# Patient Record
Sex: Male | Born: 1950 | Race: White | Hispanic: No | Marital: Married | Smoking: Never smoker
Health system: Southern US, Community
[De-identification: ages and names within clinical notes are randomized; demographics above are authoritative.]

## PROBLEM LIST (undated history)

## (undated) ENCOUNTER — Ambulatory Visit: Admission: EM | Payer: Medicare HMO | Source: Home / Self Care

## (undated) DIAGNOSIS — M1990 Unspecified osteoarthritis, unspecified site: Secondary | ICD-10-CM

## (undated) DIAGNOSIS — I509 Heart failure, unspecified: Secondary | ICD-10-CM

## (undated) DIAGNOSIS — F1011 Alcohol abuse, in remission: Secondary | ICD-10-CM

## (undated) DIAGNOSIS — I209 Angina pectoris, unspecified: Secondary | ICD-10-CM

## (undated) DIAGNOSIS — R0602 Shortness of breath: Secondary | ICD-10-CM

## (undated) DIAGNOSIS — K760 Fatty (change of) liver, not elsewhere classified: Secondary | ICD-10-CM

## (undated) DIAGNOSIS — IMO0001 Reserved for inherently not codable concepts without codable children: Secondary | ICD-10-CM

## (undated) DIAGNOSIS — E1165 Type 2 diabetes mellitus with hyperglycemia: Principal | ICD-10-CM

## (undated) DIAGNOSIS — E669 Obesity, unspecified: Secondary | ICD-10-CM

## (undated) DIAGNOSIS — I10 Essential (primary) hypertension: Secondary | ICD-10-CM

## (undated) DIAGNOSIS — F988 Other specified behavioral and emotional disorders with onset usually occurring in childhood and adolescence: Secondary | ICD-10-CM

## (undated) DIAGNOSIS — D649 Anemia, unspecified: Secondary | ICD-10-CM

## (undated) DIAGNOSIS — R600 Localized edema: Secondary | ICD-10-CM

## (undated) DIAGNOSIS — Z5189 Encounter for other specified aftercare: Secondary | ICD-10-CM

## (undated) DIAGNOSIS — M549 Dorsalgia, unspecified: Secondary | ICD-10-CM

## (undated) DIAGNOSIS — D131 Benign neoplasm of stomach: Secondary | ICD-10-CM

## (undated) DIAGNOSIS — R51 Headache: Secondary | ICD-10-CM

## (undated) DIAGNOSIS — B1910 Unspecified viral hepatitis B without hepatic coma: Secondary | ICD-10-CM

## (undated) DIAGNOSIS — N289 Disorder of kidney and ureter, unspecified: Secondary | ICD-10-CM

## (undated) DIAGNOSIS — J189 Pneumonia, unspecified organism: Secondary | ICD-10-CM

## (undated) DIAGNOSIS — I4891 Unspecified atrial fibrillation: Secondary | ICD-10-CM

## (undated) DIAGNOSIS — G9332 Myalgic encephalomyelitis/chronic fatigue syndrome: Secondary | ICD-10-CM

## (undated) DIAGNOSIS — G4733 Obstructive sleep apnea (adult) (pediatric): Secondary | ICD-10-CM

## (undated) DIAGNOSIS — K5900 Constipation, unspecified: Secondary | ICD-10-CM

## (undated) DIAGNOSIS — R079 Chest pain, unspecified: Secondary | ICD-10-CM

## (undated) DIAGNOSIS — K5790 Diverticulosis of intestine, part unspecified, without perforation or abscess without bleeding: Secondary | ICD-10-CM

## (undated) DIAGNOSIS — I219 Acute myocardial infarction, unspecified: Secondary | ICD-10-CM

## (undated) DIAGNOSIS — G709 Myoneural disorder, unspecified: Secondary | ICD-10-CM

## (undated) DIAGNOSIS — F419 Anxiety disorder, unspecified: Secondary | ICD-10-CM

## (undated) DIAGNOSIS — H9190 Unspecified hearing loss, unspecified ear: Secondary | ICD-10-CM

## (undated) DIAGNOSIS — F329 Major depressive disorder, single episode, unspecified: Secondary | ICD-10-CM

## (undated) DIAGNOSIS — E785 Hyperlipidemia, unspecified: Secondary | ICD-10-CM

## (undated) DIAGNOSIS — F32A Depression, unspecified: Secondary | ICD-10-CM

## (undated) DIAGNOSIS — F1010 Alcohol abuse, uncomplicated: Secondary | ICD-10-CM

## (undated) DIAGNOSIS — N183 Chronic kidney disease, stage 3 (moderate): Secondary | ICD-10-CM

## (undated) DIAGNOSIS — Z86718 Personal history of other venous thrombosis and embolism: Secondary | ICD-10-CM

## (undated) DIAGNOSIS — G5610 Other lesions of median nerve, unspecified upper limb: Secondary | ICD-10-CM

## (undated) DIAGNOSIS — H269 Unspecified cataract: Secondary | ICD-10-CM

## (undated) DIAGNOSIS — K259 Gastric ulcer, unspecified as acute or chronic, without hemorrhage or perforation: Secondary | ICD-10-CM

## (undated) DIAGNOSIS — E1140 Type 2 diabetes mellitus with diabetic neuropathy, unspecified: Principal | ICD-10-CM

## (undated) DIAGNOSIS — E119 Type 2 diabetes mellitus without complications: Secondary | ICD-10-CM

## (undated) DIAGNOSIS — M2550 Pain in unspecified joint: Secondary | ICD-10-CM

## (undated) HISTORY — DX: Acute myocardial infarction, unspecified: I21.9

## (undated) HISTORY — DX: Type 2 diabetes mellitus with diabetic neuropathy, unspecified: E11.40

## (undated) HISTORY — DX: Morbid (severe) obesity due to excess calories: E66.01

## (undated) HISTORY — DX: Unspecified osteoarthritis, unspecified site: M19.90

## (undated) HISTORY — DX: Type 2 diabetes mellitus with hyperglycemia: E11.65

## (undated) HISTORY — DX: Constipation, unspecified: K59.00

## (undated) HISTORY — DX: Diverticulosis of intestine, part unspecified, without perforation or abscess without bleeding: K57.90

## (undated) HISTORY — DX: Other specified behavioral and emotional disorders with onset usually occurring in childhood and adolescence: F98.8

## (undated) HISTORY — DX: Alcohol abuse, in remission: F10.11

## (undated) HISTORY — DX: Fatty (change of) liver, not elsewhere classified: K76.0

## (undated) HISTORY — DX: Myalgic encephalomyelitis/chronic fatigue syndrome: G93.32

## (undated) HISTORY — PX: DIAGNOSTIC LAPAROSCOPY: SUR761

## (undated) HISTORY — DX: Localized edema: R60.0

## (undated) HISTORY — DX: Dorsalgia, unspecified: M54.9

## (undated) HISTORY — DX: Unspecified cataract: H26.9

## (undated) HISTORY — DX: Benign neoplasm of stomach: D13.1

## (undated) HISTORY — PX: BACK SURGERY: SHX140

## (undated) HISTORY — DX: Alcohol abuse, uncomplicated: F10.10

## (undated) HISTORY — DX: Hyperlipidemia, unspecified: E78.5

## (undated) HISTORY — PX: COLONOSCOPY: SHX174

## (undated) HISTORY — DX: Unspecified viral hepatitis B without hepatic coma: B19.10

## (undated) HISTORY — DX: Chronic kidney disease, stage 3 (moderate): N18.3

## (undated) HISTORY — DX: Obesity, unspecified: E66.9

## (undated) HISTORY — DX: Unspecified hearing loss, unspecified ear: H91.90

## (undated) HISTORY — DX: Personal history of other venous thrombosis and embolism: Z86.718

## (undated) HISTORY — PX: ABDOMINAL SURGERY: SHX537

## (undated) HISTORY — DX: Pain in unspecified joint: M25.50

## (undated) HISTORY — DX: Chest pain, unspecified: R07.9

## (undated) HISTORY — DX: Type 2 diabetes mellitus without complications: E11.9

## (undated) HISTORY — DX: Other lesions of median nerve, unspecified upper limb: G56.10

## (undated) HISTORY — DX: Gastric ulcer, unspecified as acute or chronic, without hemorrhage or perforation: K25.9

---

## 1975-03-17 HISTORY — PX: GASTRIC BYPASS: SHX52

## 1994-03-16 HISTORY — PX: HYDROCELE EXCISION: SHX482

## 2002-03-16 HISTORY — PX: KNEE SURGERY: SHX244

## 2011-05-08 ENCOUNTER — Emergency Department (HOSPITAL_COMMUNITY): Payer: Medicare Other

## 2011-05-08 ENCOUNTER — Other Ambulatory Visit: Payer: Self-pay

## 2011-05-08 ENCOUNTER — Observation Stay (HOSPITAL_COMMUNITY)
Admission: EM | Admit: 2011-05-08 | Discharge: 2011-05-10 | Disposition: A | Discharge status: Home / Self Care | Payer: Medicare Other | Attending: Internal Medicine | Admitting: Internal Medicine

## 2011-05-08 ENCOUNTER — Encounter (HOSPITAL_COMMUNITY): Payer: Self-pay

## 2011-05-08 DIAGNOSIS — E876 Hypokalemia: Secondary | ICD-10-CM | POA: Diagnosis present

## 2011-05-08 DIAGNOSIS — K5900 Constipation, unspecified: Secondary | ICD-10-CM | POA: Diagnosis present

## 2011-05-08 DIAGNOSIS — G4733 Obstructive sleep apnea (adult) (pediatric): Secondary | ICD-10-CM | POA: Insufficient documentation

## 2011-05-08 DIAGNOSIS — G8929 Other chronic pain: Secondary | ICD-10-CM | POA: Insufficient documentation

## 2011-05-08 DIAGNOSIS — R51 Headache: Secondary | ICD-10-CM | POA: Insufficient documentation

## 2011-05-08 DIAGNOSIS — R0602 Shortness of breath: Secondary | ICD-10-CM | POA: Insufficient documentation

## 2011-05-08 DIAGNOSIS — R079 Chest pain, unspecified: Principal | ICD-10-CM | POA: Diagnosis present

## 2011-05-08 DIAGNOSIS — I10 Essential (primary) hypertension: Secondary | ICD-10-CM | POA: Insufficient documentation

## 2011-05-08 HISTORY — DX: Unspecified osteoarthritis, unspecified site: M19.90

## 2011-05-08 HISTORY — DX: Heart failure, unspecified: I50.9

## 2011-05-08 HISTORY — DX: Major depressive disorder, single episode, unspecified: F32.9

## 2011-05-08 HISTORY — DX: Essential (primary) hypertension: I10

## 2011-05-08 HISTORY — DX: Myoneural disorder, unspecified: G70.9

## 2011-05-08 HISTORY — DX: Shortness of breath: R06.02

## 2011-05-08 HISTORY — DX: Encounter for other specified aftercare: Z51.89

## 2011-05-08 HISTORY — DX: Obstructive sleep apnea (adult) (pediatric): G47.33

## 2011-05-08 HISTORY — DX: Angina pectoris, unspecified: I20.9

## 2011-05-08 HISTORY — DX: Headache: R51

## 2011-05-08 HISTORY — DX: Anxiety disorder, unspecified: F41.9

## 2011-05-08 HISTORY — DX: Anemia, unspecified: D64.9

## 2011-05-08 HISTORY — DX: Depression, unspecified: F32.A

## 2011-05-08 HISTORY — DX: Reserved for inherently not codable concepts without codable children: IMO0001

## 2011-05-08 HISTORY — DX: Pneumonia, unspecified organism: J18.9

## 2011-05-08 LAB — APTT: aPTT: 26 seconds (ref 24–37)

## 2011-05-08 LAB — DIFFERENTIAL
Basophils Absolute: 0.1 10*3/uL (ref 0.0–0.1)
Basophils Relative: 1 % (ref 0–1)
Eosinophils Relative: 2 % (ref 0–5)
Monocytes Absolute: 0.9 10*3/uL (ref 0.1–1.0)
Monocytes Relative: 9 % (ref 3–12)
Neutro Abs: 5.8 10*3/uL (ref 1.7–7.7)

## 2011-05-08 LAB — COMPREHENSIVE METABOLIC PANEL
Albumin: 3.6 g/dL (ref 3.5–5.2)
BUN: 21 mg/dL (ref 6–23)
CO2: 36 mEq/L — ABNORMAL HIGH (ref 19–32)
Calcium: 9.7 mg/dL (ref 8.4–10.5)
Chloride: 88 mEq/L — ABNORMAL LOW (ref 96–112)
Creatinine, Ser: 1.15 mg/dL (ref 0.50–1.35)
GFR calc non Af Amer: 67 mL/min — ABNORMAL LOW (ref >90)
Total Bilirubin: 0.3 mg/dL (ref 0.3–1.2)

## 2011-05-08 LAB — CBC
HCT: 42.1 % (ref 39.0–52.0)
Hemoglobin: 14.4 g/dL (ref 13.0–17.0)
MCHC: 34.2 g/dL (ref 30.0–36.0)
MCV: 92.3 fL (ref 78.0–100.0)
RDW: 13.8 % (ref 11.5–15.5)

## 2011-05-08 LAB — PRO B NATRIURETIC PEPTIDE: Pro B Natriuretic peptide (BNP): 84.9 pg/mL (ref 0–125)

## 2011-05-08 LAB — PROTIME-INR
INR: 0.96 (ref 0.00–1.49)
Prothrombin Time: 13 seconds (ref 11.6–15.2)

## 2011-05-08 MED ORDER — POTASSIUM CHLORIDE CRYS ER 20 MEQ PO TBCR
40.0000 meq | EXTENDED_RELEASE_TABLET | Freq: Once | ORAL | Status: AC
  Administered 2011-05-08: 40 meq via ORAL
  Filled 2011-05-08: qty 2

## 2011-05-08 NOTE — ED Provider Notes (Cosign Needed)
History     CSN: [id number]  Arrival date & time 05/08/11  1731   First MD Initiated Contact with Patient 05/08/11 1841      Chief Complaint  Patient presents with  . Chest Pain    (Consider location/radiation/quality/duration/timing/severity/associated sxs/prior treatment) HPI  Patient states he has been constipated for a few weeks. He states yesterday he he took 10 Dulcolax tablets at bedtime and took 6 this morning about result. He states he's used 2 bottles of milk of magnesia without results. He states he called his doctor's office and was going there today to be checked for his constipation however while driving to the doctors he started getting a pain in his central chest that he described as something sitting on his chest. He took one nitroglycerin without results and then took a second nitroglycerin and the pain eased off. He states he has a headache now. He estimates his pain lasted about 20 minutes. He also states she's been having shortness of breath for the past week with swelling of his legs. He also has dyspnea on exertion and PND and relates his abdomen feels tight. He states he has gained 9 pounds this week and actually gained 5 pounds in one day he states is closer sit tight he can hardly get him but now. Patient relates he has a history of congestive heart failure.  PCP Dr. Creta Levin with [facility] ([facility])  Past Medical History  Diagnosis Date  . CHF (congestive heart failure)   . Hypertension   . Anemia   . Sleep apnea, obstructive     does where bipap  Depression  No past surgical history on file.  No family history on file.  History  Substance Use Topics  . Smoking status: Never Smoker   . Smokeless tobacco: Not on file  . Alcohol Use: No  Lives alone divorced    Review of Systems  All other systems reviewed and are negative.    Allergies  Adhesive; Feldene; Latex; and Morphine and related  Home Medications    Current Outpatient Rx  Name Route Sig Dispense Refill  . ASPIRIN 325 MG PO TBEC Oral Take 325 mg by mouth daily.    . DESVENLAFAXINE SUCCINATE ER 50 MG PO TB24 Oral Take 50 mg by mouth daily.    Marland Kitchen DOCUSATE SODIUM 100 MG PO CAPS Oral Take 200 mg by mouth every morning.    Marland Kitchen FEXOFENADINE HCL 180 MG PO TABS Oral Take 180 mg by mouth daily.    Marland Kitchen GABAPENTIN 800 MG PO TABS Oral Take 800-2,400 mg by mouth 2 (two) times daily. Take 800 mg in the morning and 2400 mg at night    . HYDROMORPHONE HCL 2 MG PO TABS Oral Take 6 mg by mouth 3 (three) times daily.    Marland Kitchen METHADONE HCL 10 MG PO TABS Oral Take 30 mg by mouth 2 (two) times daily.    Marland Kitchen METOPROLOL TARTRATE 50 MG PO TABS Oral Take 25 mg by mouth 2 (two) times daily.    . ADULT MULTIVITAMIN W/MINERALS CH Oral Take 1 tablet by mouth daily.    Marland Kitchen NITROGLYCERIN 0.4 MG SL SUBL Sublingual Place 0.4 mg under the tongue every 5 (five) minutes as needed. For chest pain    . POLYVINYL ALCOHOL 1.4 % OP SOLN Both Eyes Place 1 drop into both eyes daily as needed. For dry eyes    . SODIUM CHLORIDE 0.65 % NA SOLN Nasal Place 1 spray into  the nose 3 (three) times daily as needed. For nasal irritation    . TESTOSTERONE 1.25 GM/ACT (1%) TD GEL Transdermal Place 1 application onto the skin daily.    . TRAZODONE HCL 100 MG PO TABS Oral Take 300 mg by mouth at bedtime.    . TRIAMCINOLONE ACETONIDE 0.1 % EX CREA Topical Apply 1 application topically daily. Apply to groin    Lasix 40 mg BID  BP 116/84  Pulse 73  Temp 98.1 F (36.7 C)  Resp 22  Ht 6' (1.829 m)  Wt 390 lb (176.903 kg)  BMI 52.89 kg/m2  SpO2 96%  Vital signs normal    Physical Exam  Nursing note and vitals reviewed. Constitutional: He is oriented to person, place, and time. He appears well-developed and well-nourished.  Non-toxic appearance. He does not appear ill. No distress.       Morbidly obese  HENT:  Head: Normocephalic and atraumatic.  Right Ear: External ear normal.  Left Ear:  External ear normal.  Nose: Nose normal. No mucosal edema or rhinorrhea.  Mouth/Throat: Oropharynx is clear and moist and mucous membranes are normal. No dental abscesses or uvula swelling.  Eyes: Conjunctivae and EOM are normal. Pupils are equal, round, and reactive to light.  Neck: Normal range of motion and full passive range of motion without pain. Neck supple.  Cardiovascular: Normal rate, regular rhythm and normal heart sounds.  Exam reveals no gallop and no friction rub.   No murmur heard. Pulmonary/Chest: Effort normal and breath sounds normal. No respiratory distress. He has no wheezes. He has no rhonchi. He has no rales. He exhibits no tenderness and no crepitus.       Patient has marked diminished breath sounds few slightly  Abdominal: Soft. Normal appearance and bowel sounds are normal. He exhibits distension. There is no tenderness. There is no rebound and no guarding.  Musculoskeletal: Normal range of motion. He exhibits edema. He exhibits no tenderness.       Patient has a trace pitting edema of his lower extremities.  Neurological: He is alert and oriented to person, place, and time. He has normal strength. No cranial nerve deficit.  Skin: Skin is warm, dry and intact. No rash noted. No erythema. No pallor.  Psychiatric: His speech is normal and behavior is normal. His mood appears not anxious.       Affect is flat    ED Course  Procedures (including critical care time)  Pt has never had a cardiology evaluation, states he didn't follow through with the consult.   00:13 Dr Mort Sawyers, admit to tele-observation, team 1, Dr Benjamine Mola   Results for orders placed during the hospital encounter of 05/08/11  PRO B NATRIURETIC PEPTIDE      Component Value Range   Pro B Natriuretic peptide (BNP) 84.9  0 - 125 (pg/mL)  CBC      Component Value Range   WBC 9.9  4.0 - 10.5 (K/uL)   RBC 4.56  4.22 - 5.81 (MIL/uL)   Hemoglobin 14.4  13.0 - 17.0 (g/dL)   HCT 82.9  56.2 - 13.0 (%)   MCV  92.3  78.0 - 100.0 (fL)   MCH 31.6  26.0 - 34.0 (pg)   MCHC 34.2  30.0 - 36.0 (g/dL)   RDW 86.5  78.4 - 69.6 (%)   Platelets 234  150 - 400 (K/uL)  DIFFERENTIAL      Component Value Range   Neutrophils Relative 58  43 - 77 (%)  Neutro Abs 5.8  1.7 - 7.7 (K/uL)   Lymphocytes Relative 30  12 - 46 (%)   Lymphs Abs 3.0  0.7 - 4.0 (K/uL)   Monocytes Relative 9  3 - 12 (%)   Monocytes Absolute 0.9  0.1 - 1.0 (K/uL)   Eosinophils Relative 2  0 - 5 (%)   Eosinophils Absolute 0.2  0.0 - 0.7 (K/uL)   Basophils Relative 1  0 - 1 (%)   Basophils Absolute 0.1  0.0 - 0.1 (K/uL)  COMPREHENSIVE METABOLIC PANEL      Component Value Range   Sodium 136  135 - 145 (mEq/L)   Potassium 2.9 (*) 3.5 - 5.1 (mEq/L)   Chloride 88 (*) 96 - 112 (mEq/L)   CO2 36 (*) 19 - 32 (mEq/L)   Glucose, Bld 124 (*) 70 - 99 (mg/dL)   BUN 21  6 - 23 (mg/dL)   Creatinine, Ser 1.61  0.50 - 1.35 (mg/dL)   Calcium 9.7  8.4 - 09.6 (mg/dL)   Total Protein 7.3  6.0 - 8.3 (g/dL)   Albumin 3.6  3.5 - 5.2 (g/dL)   AST 25  0 - 37 (U/L)   ALT 30  0 - 53 (U/L)   Alkaline Phosphatase 71  39 - 117 (U/L)   Total Bilirubin 0.3  0.3 - 1.2 (mg/dL)   GFR calc non Af Amer 67 (*) >90 (mL/min)   GFR calc Af Amer 78 (*) >90 (mL/min)  APTT      Component Value Range   aPTT 26  24 - 37 (seconds)  PROTIME-INR      Component Value Range   Prothrombin Time 13.0  11.6 - 15.2 (seconds)   INR 0.96  0.00 - 1.49   POCT I-STAT TROPONIN I      Component Value Range   Troponin i, poc 0.00  0.00 - 0.08 (ng/mL)   Comment 3           Laboratory interpretation all normal except for hypokalemia   Dg Chest Port 1 View  05/08/2011  *RADIOLOGY REPORT*  Clinical Data: Shortness of breath.  History of CHF.  PORTABLE CHEST - 1 VIEW 05/08/2011:  Comparison: None.  Findings: Suboptimal inspiration due to body habitus which accounts for crowded bronchovascular markings at the bases and accentuates the cardiac silhouette.  Taking this into account, cardiac  silhouette mildly enlarged.  Lungs clear.  No visible pleural effusions.  Surgical clips in the left upper quadrant of the abdomen at the esophagogastric junction.  Prior cervical spine fusion.  IMPRESSION: Suboptimal inspiration.  No acute cardiopulmonary disease.  Mild cardiomegaly.  Original Report Authenticated By: Arnell Sieving, M.D.   Dg Abd Acute W/chest  05/08/2011  *RADIOLOGY REPORT*  Clinical Data: 61 year old male with chest and abdominal pain. Abdominal distention, shortness of breath.  ACUTE ABDOMEN SERIES (ABDOMEN 2 VIEW & CHEST 1 VIEW)  Comparison: Portable abdomen and chest radiograph from earlier the same day.  Findings: PA views of the chest.  Low lung volumes.  Curvilinear opacity at the left base most resembles atelectasis.  Epigastric surgical clips.  Cardiac size and mediastinal contours are within normal limits.  No pneumothorax or pneumoperitoneum.  Sequelae of lumbar fusion. Nonobstructed bowel gas pattern. No acute osseous abnormality identified.  IMPRESSION: 1. Nonobstructed bowel gas pattern, no free air. 2.  Low lung volumes with left basilar atelectasis.  Original Report Authenticated By: Harley Hallmark, M.D.   Dg Abd Portable 1v  05/08/2011  *RADIOLOGY REPORT*  Clinical Data: 61 year old male with abdominal pain.  PORTABLE ABDOMEN - 1 VIEW  Comparison:  None  Findings: The bowel gas pattern is normal.  No radio-opaque calculi or other significant radiographic abnormality is seen. Lumbar spine surgical hardware is identified. The study is limited by patient body habitus.  IMPRESSION: No evidence of acute abnormality.  The study is limited by patient body habitus.  Original Report Authenticated By: Rosendo Gros, M.D.         Date: 05/08/2011  Rate: 71  Rhythm: normal sinus rhythm  QRS Axis: normal  Intervals: QT prolonged  ST/T Wave abnormalities: normal  Conduction Disutrbances:IRBBB  Narrative Interpretation:   Old EKG Reviewed: none available  Diagnoses  that have been ruled out:  None  Diagnoses that are still under consideration:  None  Final diagnoses:  Constipation  Chest pain  Hypokalemia   Plan admission  Devoria Albe, MD, FACEP    MDM          Ward Givens, MD 05/09/11 Jacinta Shoe  Ward Givens, MD 05/09/11 0030

## 2011-05-08 NOTE — ED Notes (Signed)
Pt. Also reports that he  Has bilateral swollen legs and his abdomen is distended and hard.

## 2011-05-08 NOTE — ED Notes (Signed)
Pt. Was on his way to his doctors office when he developed chest pain and took 2 nitro.  Upon arrival to the Doctors chest pain was resolved pt. Does have sob and dizziness. Also pt. Has had constipation for 2 weeks.

## 2011-05-09 ENCOUNTER — Encounter (HOSPITAL_COMMUNITY): Payer: Self-pay | Admitting: *Deleted

## 2011-05-09 DIAGNOSIS — E876 Hypokalemia: Secondary | ICD-10-CM | POA: Diagnosis present

## 2011-05-09 DIAGNOSIS — R079 Chest pain, unspecified: Secondary | ICD-10-CM | POA: Diagnosis present

## 2011-05-09 DIAGNOSIS — K5900 Constipation, unspecified: Secondary | ICD-10-CM | POA: Diagnosis present

## 2011-05-09 LAB — CBC
HCT: 41 % (ref 39.0–52.0)
MCV: 91.9 fL (ref 78.0–100.0)
RDW: 13.9 % (ref 11.5–15.5)
WBC: 8.2 10*3/uL (ref 4.0–10.5)

## 2011-05-09 LAB — CARDIAC PANEL(CRET KIN+CKTOT+MB+TROPI)
CK, MB: 3.2 ng/mL (ref 0.3–4.0)
Relative Index: 1.5 (ref 0.0–2.5)
Relative Index: 1.7 (ref 0.0–2.5)
Relative Index: 1.9 (ref 0.0–2.5)
Total CK: 183 U/L (ref 7–232)
Troponin I: <0.3 ng/mL (ref <0.30)

## 2011-05-09 LAB — BASIC METABOLIC PANEL
BUN: 21 mg/dL (ref 6–23)
BUN: 17 mg/dL (ref 6–23)
CO2: 35 mEq/L — ABNORMAL HIGH (ref 19–32)
Calcium: 9.8 mg/dL (ref 8.4–10.5)
Chloride: 91 mEq/L — ABNORMAL LOW (ref 96–112)
Creatinine, Ser: 0.97 mg/dL (ref 0.50–1.35)
GFR calc non Af Amer: >90 mL/min (ref >90)
Glucose, Bld: 118 mg/dL — ABNORMAL HIGH (ref 70–99)
Glucose, Bld: 161 mg/dL — ABNORMAL HIGH (ref 70–99)

## 2011-05-09 MED ORDER — POTASSIUM CHLORIDE CRYS ER 20 MEQ PO TBCR
40.0000 meq | EXTENDED_RELEASE_TABLET | Freq: Two times a day (BID) | ORAL | Status: AC
  Administered 2011-05-09 (×2): 40 meq via ORAL
  Filled 2011-05-09 (×2): qty 2

## 2011-05-09 MED ORDER — ADULT MULTIVITAMIN W/MINERALS CH
1.0000 | ORAL_TABLET | Freq: Every day | ORAL | Status: DC
  Administered 2011-05-09 – 2011-05-10 (×2): 1 via ORAL
  Filled 2011-05-09 (×2): qty 1

## 2011-05-09 MED ORDER — ENOXAPARIN SODIUM 80 MG/0.8ML ~~LOC~~ SOLN
80.0000 mg | Freq: Every day | SUBCUTANEOUS | Status: DC
  Administered 2011-05-09: 80 mg via SUBCUTANEOUS
  Filled 2011-05-09 (×2): qty 0.8

## 2011-05-09 MED ORDER — TRAZODONE HCL 150 MG PO TABS
300.0000 mg | ORAL_TABLET | Freq: Every day | ORAL | Status: DC
  Administered 2011-05-09 (×2): 300 mg via ORAL
  Filled 2011-05-09 (×3): qty 2

## 2011-05-09 MED ORDER — SODIUM CHLORIDE 0.9 % IJ SOLN: 3.0000 mL | Freq: Two times a day (BID) | INTRAMUSCULAR | Status: DC

## 2011-05-09 MED ORDER — ALUM & MAG HYDROXIDE-SIMETH 200-200-20 MG/5ML PO SUSP
30.0000 mL | Freq: Four times a day (QID) | ORAL | Status: DC | PRN
Start: 2011-05-09 — End: 2011-05-10

## 2011-05-09 MED ORDER — OXYCODONE HCL 5 MG PO TABS: 5.0000 mg | ORAL_TABLET | ORAL | Status: DC | PRN

## 2011-05-09 MED ORDER — POTASSIUM CHLORIDE CRYS ER 20 MEQ PO TBCR
40.0000 meq | EXTENDED_RELEASE_TABLET | ORAL | Status: AC
  Administered 2011-05-09 (×2): 40 meq via ORAL
  Filled 2011-05-09 (×2): qty 2

## 2011-05-09 MED ORDER — POLYVINYL ALCOHOL 1.4 % OP SOLN: 1.0000 [drp] | Freq: Every day | OPHTHALMIC | Status: DC | PRN

## 2011-05-09 MED ORDER — VENLAFAXINE HCL ER 75 MG PO CP24
75.0000 mg | ORAL_CAPSULE | Freq: Every day | ORAL | Status: DC
  Administered 2011-05-09 – 2011-05-10 (×2): 75 mg via ORAL
  Filled 2011-05-09 (×4): qty 1

## 2011-05-09 MED ORDER — METHADONE HCL 10 MG PO TABS
30.0000 mg | ORAL_TABLET | Freq: Two times a day (BID) | ORAL | Status: DC
  Administered 2011-05-09 – 2011-05-10 (×4): 30 mg via ORAL
  Filled 2011-05-09 (×4): qty 3

## 2011-05-09 MED ORDER — SODIUM CHLORIDE 0.9 % IV SOLN: 250.0000 mL | INTRAVENOUS | Status: DC | PRN

## 2011-05-09 MED ORDER — GABAPENTIN 600 MG PO TABS
2400.0000 mg | ORAL_TABLET | Freq: Every day | ORAL | Status: DC
  Administered 2011-05-09: 2400 mg via ORAL
  Filled 2011-05-09 (×2): qty 4

## 2011-05-09 MED ORDER — SODIUM CHLORIDE 0.9 % IJ SOLN: 3.0000 mL | INTRAMUSCULAR | Status: DC | PRN

## 2011-05-09 MED ORDER — ACETAMINOPHEN 325 MG PO TABS: 650.0000 mg | ORAL_TABLET | Freq: Four times a day (QID) | ORAL | Status: DC | PRN

## 2011-05-09 MED ORDER — METOPROLOL TARTRATE 25 MG PO TABS
25.0000 mg | ORAL_TABLET | Freq: Two times a day (BID) | ORAL | Status: DC
  Administered 2011-05-09 (×2): 25 mg via ORAL
  Filled 2011-05-09 (×4): qty 1

## 2011-05-09 MED ORDER — POTASSIUM CHLORIDE 10 MEQ/100ML IV SOLN
10.0000 meq | INTRAVENOUS | Status: AC
  Administered 2011-05-09 (×2): 10 meq via INTRAVENOUS
  Filled 2011-05-09 (×2): qty 100

## 2011-05-09 MED ORDER — ACETAMINOPHEN 650 MG RE SUPP: 650.0000 mg | Freq: Four times a day (QID) | RECTAL | Status: DC | PRN

## 2011-05-09 MED ORDER — NITROGLYCERIN 0.2 MG/HR TD PT24
0.2000 mg | MEDICATED_PATCH | Freq: Every day | TRANSDERMAL | Status: DC
  Administered 2011-05-09 – 2011-05-10 (×2): 0.2 mg via TRANSDERMAL
  Filled 2011-05-09 (×2): qty 1

## 2011-05-09 MED ORDER — SALINE SPRAY 0.65 % NA SOLN: 1.0000 | Freq: Three times a day (TID) | NASAL | Status: DC | PRN

## 2011-05-09 MED ORDER — SENNA 8.6 MG PO TABS: 2.0000 | ORAL_TABLET | Freq: Every day | ORAL | Status: DC

## 2011-05-09 MED ORDER — ONDANSETRON HCL 4 MG/2ML IJ SOLN: 4.0000 mg | Freq: Four times a day (QID) | INTRAMUSCULAR | Status: DC | PRN

## 2011-05-09 MED ORDER — DOCUSATE SODIUM 100 MG PO CAPS
100.0000 mg | ORAL_CAPSULE | Freq: Two times a day (BID) | ORAL | Status: DC
  Administered 2011-05-09 – 2011-05-10 (×3): 100 mg via ORAL
  Filled 2011-05-09 (×4): qty 1

## 2011-05-09 MED ORDER — LORATADINE 10 MG PO TABS
10.0000 mg | ORAL_TABLET | Freq: Every day | ORAL | Status: DC
  Administered 2011-05-09 – 2011-05-10 (×2): 10 mg via ORAL
  Filled 2011-05-09 (×2): qty 1

## 2011-05-09 MED ORDER — TRIAMCINOLONE ACETONIDE 0.1 % EX CREA
1.0000 "application " | TOPICAL_CREAM | Freq: Every day | CUTANEOUS | Status: DC
  Administered 2011-05-09 – 2011-05-10 (×2): 1 via TOPICAL
  Filled 2011-05-09 (×2): qty 15

## 2011-05-09 MED ORDER — TESTOSTERONE 50 MG/5GM (1%) TD GEL
2.5000 g | Freq: Every day | TRANSDERMAL | Status: DC
  Administered 2011-05-09 – 2011-05-10 (×2): 2.5 g via TRANSDERMAL
  Filled 2011-05-09 (×2): qty 5

## 2011-05-09 MED ORDER — ZOLPIDEM TARTRATE 5 MG PO TABS: 5.0000 mg | ORAL_TABLET | Freq: Every evening | ORAL | Status: DC | PRN

## 2011-05-09 MED ORDER — SENNA 8.6 MG PO TABS
2.0000 | ORAL_TABLET | Freq: Every day | ORAL | Status: DC
  Administered 2011-05-09 (×2): 17.2 mg via ORAL
  Filled 2011-05-09 (×3): qty 2

## 2011-05-09 MED ORDER — ONDANSETRON HCL 4 MG PO TABS: 4.0000 mg | ORAL_TABLET | Freq: Four times a day (QID) | ORAL | Status: DC | PRN

## 2011-05-09 MED ORDER — ASPIRIN EC 325 MG PO TBEC
325.0000 mg | DELAYED_RELEASE_TABLET | Freq: Every day | ORAL | Status: DC
  Administered 2011-05-09 – 2011-05-10 (×2): 325 mg via ORAL
  Filled 2011-05-09 (×2): qty 1

## 2011-05-09 MED ORDER — GABAPENTIN 400 MG PO CAPS
800.0000 mg | ORAL_CAPSULE | Freq: Every day | ORAL | Status: DC
  Administered 2011-05-09 – 2011-05-10 (×2): 800 mg via ORAL
  Filled 2011-05-09 (×2): qty 2

## 2011-05-09 NOTE — Progress Notes (Signed)
CRITICAL VALUE ALERT  Critical value received:  K=2.7  Date of notification:  05/09/2011  Time of notification:  0838  Critical value read back:yes Yes  Nurse who received alert:  Toney Reil   MD notified (1st page):  MD Benjamine Mola  Time of first page:  [phone]  MD notified (2nd page):  Time of second page:  Responding MD:  MD Benjamine Mola  Time MD responded:  [phone]

## 2011-05-09 NOTE — H&P (Addendum)
DATE OF ADMISSION:  05/09/2011  PCP:  Dr. Creta Levin   Chief Complaint:  Chest Pain   HPI: Phillip Maldonado is an 61 y.o. male presenting with complaints of Chest Pain.  He states that he was on his way to his PCP's office because of problems he had been having over the past week of constipation, and was worried he may be impacted. He has a history of multiple ABD surgeries and has problems in the past with impactions.  On the way he began to have severe substernal chest pain that felt like heaviness in his chest, and the discomfort was not relieved until after he had taken 2 SL Nitroglycerin tabs.  He rated the pain as a 7-8 out of 10.  When he arrived at his PCP's office, he was sent to the ED.    Past Medical History  Diagnosis Date  . CHF (congestive heart failure)   . Hypertension   . Anemia   . Sleep apnea, obstructive     does where bipap  . Angina   . Shortness of breath   . Anxiety   . Blood transfusion   . Headache   . Pneumonia   . Depression   . Hepatitis Hep B  . Neuromuscular disorder DJD  . Arthritis     Past Surgical History  Procedure Date  . Back surgery   . Colon surgery     Gastric [phone]; Reversed in 1992 and revision 1994  . Diagnostic laparoscopy     Medications:  HOME MEDS: Prior to Admission medications   Medication Sig Start Date End Date Taking? Authorizing Provider  aspirin 325 MG EC tablet Take 325 mg by mouth daily.   Yes Historical Provider, MD  desvenlafaxine (PRISTIQ) 50 MG 24 hr tablet Take 50 mg by mouth daily.   Yes Historical Provider, MD  docusate sodium (COLACE) 100 MG capsule Take 200 mg by mouth every morning.   Yes Historical Provider, MD  fexofenadine (ALLEGRA) 180 MG tablet Take 180 mg by mouth daily.   Yes Historical Provider, MD  gabapentin (NEURONTIN) 800 MG tablet Take 800-2,400 mg by mouth 2 (two) times daily. Take 800 mg in the morning and 2400 mg at night   Yes Historical Provider, MD  HYDROmorphone (DILAUDID) 2 MG  tablet Take 6 mg by mouth 3 (three) times daily.   Yes Historical Provider, MD  methadone (DOLOPHINE) 10 MG tablet Take 30 mg by mouth 2 (two) times daily.   Yes Historical Provider, MD  metoprolol (LOPRESSOR) 50 MG tablet Take 25 mg by mouth 2 (two) times daily.   Yes Historical Provider, MD  Multiple Vitamin (MULITIVITAMIN WITH MINERALS) TABS Take 1 tablet by mouth daily.   Yes Historical Provider, MD  nitroGLYCERIN (NITROSTAT) 0.4 MG SL tablet Place 0.4 mg under the tongue every 5 (five) minutes as needed. For chest pain   Yes Historical Provider, MD  polyvinyl alcohol (LIQUIFILM TEARS) 1.4 % ophthalmic solution Place 1 drop into both eyes daily as needed. For dry eyes   Yes Historical Provider, MD  sodium chloride (OCEAN) 0.65 % nasal spray Place 1 spray into the nose 3 (three) times daily as needed. For nasal irritation   Yes Historical Provider, MD  Testosterone (ANDROGEL PUMP) 1.25 GM/ACT (1%) GEL Place 1 application onto the skin daily.   Yes Historical Provider, MD  traZODone (DESYREL) 100 MG tablet Take 300 mg by mouth at bedtime.   Yes Historical Provider, MD  triamcinolone cream (KENALOG) 0.1 %  Apply 1 application topically daily. Apply to groin   Yes Historical Provider, MD    Allergies:  Allergies  Allergen Reactions  . Adhesive (Tape) Other (See Comments)    unknown  . Feldene (Piroxicam) Other (See Comments)    unknown  . Latex     Rash and itching  . Morphine And Related Other (See Comments)    unknown    Social History:   reports that he has never smoked. He does not have any smokeless tobacco history on file. He reports that he does not drink alcohol or use illicit drugs.  Family History: No family history on file.  Review of Systems:  The patient denies anorexia, fever, weight loss, vision loss, decreased hearing, hoarseness, chest pain, syncope, dyspnea on exertion, peripheral edema, balance deficits, hemoptysis, abdominal pain, melena, hematochezia, severe  indigestion/heartburn, hematuria, incontinence, genital sores, muscle weakness, suspicious skin lesions, transient blindness, difficulty walking, depression, unusual weight change, abnormal bleeding, enlarged lymph nodes, angioedema, and breast masses.   Physical Exam:  GEN:  Pleasant Morbidly Obese 61 year old Caucasian male  examined  and in no acute distress; cooperative with exam Filed Vitals:   05/08/11 2030 05/08/11 2300 05/09/11 0100 05/09/11 0300  BP: 118/78 116/78 138/81 121/74  Pulse: 75 67 75 74  Temp:    97.4 F (36.3 C)  TempSrc:    Oral  Resp: 14 18 17 18   Height:    5\' 11"  (1.803 m)  Weight:    177.4 kg (391 lb 1.5 oz)  SpO2: 95% 96% 95% 93%   Blood pressure 121/74, pulse 74, temperature 97.4 F (36.3 C), temperature source Oral, resp. rate 18, height 5\' 11"  (1.803 m), weight 177.4 kg (391 lb 1.5 oz), SpO2 93.00%. PSYCH: He is alert and oriented x4; does not appear anxious does not appear depressed; affect is normal HEENT: Normocephalic and Atraumatic, Mucous membranes pink; PERRLA; EOM intact; Fundi:  Benign;  No scleral icterus, Nares: Patent, Oropharynx: Clear,  Neck:  FROM, no cervical lymphadenopathy nor thyromegaly or carotid bruit; no JVD; Breasts:: Not examined CHEST WALL: No tenderness CHEST: Normal respiration, clear to auscultation bilaterally HEART: Regular rate and rhythm; no murmurs rubs or gallops BACK: No kyphosis or scoliosis; no CVA tenderness ABDOMEN: Positive Bowel Sounds, Soft non-tender; no masses, no organomegaly, no pannus; no intertriginous candida. Rectal Exam: Not done EXTREMITIES: No cyanosis, clubbing or edema; no ulcerations. Genitalia: not examined PULSES: 2+ and symmetric SKIN: Normal hydration no rash or ulceration CNS: Cranial nerves 2-12 grossly intact no focal neurologic deficit   Labs & Imaging Results for orders placed during the hospital encounter of 05/08/11 (from the past 48 hour(s))  PRO B NATRIURETIC PEPTIDE     Status:  Normal   Collection Time   05/08/11  7:57 PM      Component Value Range Comment   Pro B Natriuretic peptide (BNP) 84.9  0 - 125 (pg/mL)   CBC     Status: Normal   Collection Time   05/08/11  7:57 PM      Component Value Range Comment   WBC 9.9  4.0 - 10.5 (K/uL)    RBC 4.56  4.22 - 5.81 (MIL/uL)    Hemoglobin 14.4  13.0 - 17.0 (g/dL)    HCT 30.8  65.7 - 84.6 (%)    MCV 92.3  78.0 - 100.0 (fL)    MCH 31.6  26.0 - 34.0 (pg)    MCHC 34.2  30.0 - 36.0 (g/dL)  RDW 13.8  11.5 - 15.5 (%)    Platelets 234  150 - 400 (K/uL)   DIFFERENTIAL     Status: Normal   Collection Time   05/08/11  7:57 PM      Component Value Range Comment   Neutrophils Relative 58  43 - 77 (%)    Neutro Abs 5.8  1.7 - 7.7 (K/uL)    Lymphocytes Relative 30  12 - 46 (%)    Lymphs Abs 3.0  0.7 - 4.0 (K/uL)    Monocytes Relative 9  3 - 12 (%)    Monocytes Absolute 0.9  0.1 - 1.0 (K/uL)    Eosinophils Relative 2  0 - 5 (%)    Eosinophils Absolute 0.2  0.0 - 0.7 (K/uL)    Basophils Relative 1  0 - 1 (%)    Basophils Absolute 0.1  0.0 - 0.1 (K/uL)   COMPREHENSIVE METABOLIC PANEL     Status: Abnormal   Collection Time   05/08/11  7:57 PM      Component Value Range Comment   Sodium 136  135 - 145 (mEq/L)    Potassium 2.9 (*) 3.5 - 5.1 (mEq/L)    Chloride 88 (*) 96 - 112 (mEq/L)    CO2 36 (*) 19 - 32 (mEq/L)    Glucose, Bld 124 (*) 70 - 99 (mg/dL)    BUN 21  6 - 23 (mg/dL)    Creatinine, Ser 1.61  0.50 - 1.35 (mg/dL)    Calcium 9.7  8.4 - 10.5 (mg/dL)    Total Protein 7.3  6.0 - 8.3 (g/dL)    Albumin 3.6  3.5 - 5.2 (g/dL)    AST 25  0 - 37 (U/L)    ALT 30  0 - 53 (U/L)    Alkaline Phosphatase 71  39 - 117 (U/L)    Total Bilirubin 0.3  0.3 - 1.2 (mg/dL)    GFR calc non Af Amer 67 (*) >90 (mL/min)    GFR calc Af Amer 78 (*) >90 (mL/min)   APTT     Status: Normal   Collection Time   05/08/11  7:57 PM      Component Value Range Comment   aPTT 26  24 - 37 (seconds)   PROTIME-INR     Status: Normal   Collection  Time   05/08/11  7:57 PM      Component Value Range Comment   Prothrombin Time 13.0  11.6 - 15.2 (seconds)    INR 0.96  0.00 - 1.49    POCT I-STAT TROPONIN I     Status: Normal   Collection Time   05/08/11  9:54 PM      Component Value Range Comment   Troponin i, poc 0.00  0.00 - 0.08 (ng/mL)    Comment 3            CARDIAC PANEL(CRET KIN+CKTOT+MB+TROPI)     Status: Normal   Collection Time   05/09/11  1:25 AM      Component Value Range Comment   Total CK 206  7 - 232 (U/L)    CK, MB 3.0  0.3 - 4.0 (ng/mL)    Troponin I <0.30  <0.30 (ng/mL)    Relative Index 1.5  0.0 - 2.5     Dg Chest Port 1 View  05/08/2011  *RADIOLOGY REPORT*  Clinical Data: Shortness of breath.  History of CHF.  PORTABLE CHEST - 1 VIEW 05/08/2011:  Comparison: None.  Findings: Suboptimal  inspiration due to body habitus which accounts for crowded bronchovascular markings at the bases and accentuates the cardiac silhouette.  Taking this into account, cardiac silhouette mildly enlarged.  Lungs clear.  No visible pleural effusions.  Surgical clips in the left upper quadrant of the abdomen at the esophagogastric junction.  Prior cervical spine fusion.  IMPRESSION: Suboptimal inspiration.  No acute cardiopulmonary disease.  Mild cardiomegaly.  Original Report Authenticated By: Arnell Sieving, M.D.   Dg Abd Acute W/chest  05/08/2011  *RADIOLOGY REPORT*  Clinical Data: 61 year old male with chest and abdominal pain. Abdominal distention, shortness of breath.  ACUTE ABDOMEN SERIES (ABDOMEN 2 VIEW & CHEST 1 VIEW)  Comparison: Portable abdomen and chest radiograph from earlier the same day.  Findings: PA views of the chest.  Low lung volumes.  Curvilinear opacity at the left base most resembles atelectasis.  Epigastric surgical clips.  Cardiac size and mediastinal contours are within normal limits.  No pneumothorax or pneumoperitoneum.  Sequelae of lumbar fusion. Nonobstructed bowel gas pattern. No acute osseous abnormality  identified.  IMPRESSION: 1. Nonobstructed bowel gas pattern, no free air. 2.  Low lung volumes with left basilar atelectasis.  Original Report Authenticated By: Harley Hallmark, M.D.   Dg Abd Portable 1v  05/08/2011  *RADIOLOGY REPORT*  Clinical Data: 61 year old male with abdominal pain.  PORTABLE ABDOMEN - 1 VIEW  Comparison:  None  Findings: The bowel gas pattern is normal.  No radio-opaque calculi or other significant radiographic abnormality is seen. Lumbar spine surgical hardware is identified. The study is limited by patient body habitus.  IMPRESSION: No evidence of acute abnormality.  The study is limited by patient body habitus.  Original Report Authenticated By: Rosendo Gros, M.D.   EKG:  NSR with a RBBB (Old versus New)   Assessment: Present on Admission:  .Chest pain .Hypokalemia .Constipation HTN DDD Osteoarthritis Morbid Obesity   Plan:    23 hour Observation Status on Telemetry CArdiac Enzymes Nitropatch, O2, and ASA Rx Pain Controll PRn IV Protonix Laxative Therapy REconcile Home Meds DVT Prophylaxis. Other plans as per orders.    CODE STATUS:      FULL CODE         Gregori Abril C 05/09/2011, 6:54 AM

## 2011-05-09 NOTE — Progress Notes (Signed)
Pt arrived to 4705-02 from [facility] placed on tele. VSS. Pt complains of abd pain and chronic bil shoulder pain.Denies any CP or discomfort.  MD made aware. Will continue to monitor.

## 2011-05-09 NOTE — Progress Notes (Signed)
ANTICOAGULATION CONSULT NOTE   Pharmacy Consult for Lovenox Indication: VTE prophylaxis  Allergies  Allergen Reactions  . Adhesive (Tape) Other (See Comments)    unknown  . Feldene (Piroxicam) Other (See Comments)    unknown  . Latex     Rash and itching  . Morphine And Related Other (See Comments)    unknown    Patient Measurements: Height: 6' (182.9 cm) Weight: 390 lb (176.903 kg) IBW/kg (Calculated) : 77.6   Labs:  Basename 05/09/11 0125 05/08/11 1957  HGB -- 14.4  HCT -- 42.1  PLT -- 234  APTT -- 26  LABPROT -- 13.0  INR -- 0.96  HEPARINUNFRC -- --  CREATININE -- 1.15  CKTOTAL 206 --  CKMB 3.0 --  TROPONINI <0.30 --   Estimated Creatinine Clearance: 113.3 ml/min (by C-G formula based on Cr of 1.15).  Assessment: 61 yo male admitted with chest pain for DVT prophylaxis   Plan:  Increase Lovenox 0.5 mg/kg SQ q24h for this patient with morbid obesity and normal renal function.  Will sign off.  Thanks Maddix Heinz, [employer] 05/09/2011,2:47 AM

## 2011-05-09 NOTE — Progress Notes (Signed)
Patient admitted early this AM.  A/P 1. Hypokalemia- repleat and recheck 2. CP- EKG ok, CE negative will get 3 sets 3. Constipation- +BM this AM  Will follow and hope for D/C late this PM or early AM tomm  Marlin Canary D.O.

## 2011-05-10 LAB — BASIC METABOLIC PANEL
BUN: 15 mg/dL (ref 6–23)
Creatinine, Ser: 0.83 mg/dL (ref 0.50–1.35)
GFR calc Af Amer: >90 mL/min (ref >90)
GFR calc non Af Amer: >90 mL/min (ref >90)

## 2011-05-10 MED ORDER — LACTULOSE 10 GM/15ML PO SOLN: 20.0000 g | Freq: Every day | ORAL | Status: AC | PRN

## 2011-05-10 MED ORDER — METOPROLOL TARTRATE 25 MG PO TABS
25.0000 mg | ORAL_TABLET | Freq: Two times a day (BID) | ORAL | Status: DC
  Administered 2011-05-10: 25 mg via ORAL
  Filled 2011-05-10 (×2): qty 1

## 2011-05-10 MED ORDER — SENNA 8.6 MG PO TABS: 2.0000 | ORAL_TABLET | Freq: Every day | ORAL | Status: DC

## 2011-05-10 MED ORDER — LACTULOSE 10 GM/15ML PO SOLN
20.0000 g | Freq: Every day | ORAL | Status: DC
Start: 2011-05-10 — End: 2011-05-10
  Administered 2011-05-10: 20 g via ORAL
  Filled 2011-05-10: qty 30

## 2011-05-10 MED ORDER — LACTULOSE 10 GM/15ML PO SOLN
30.0000 g | Freq: Once | ORAL | Status: AC
  Administered 2011-05-10: 30 g via ORAL
  Filled 2011-05-10: qty 45

## 2011-05-10 MED ORDER — POTASSIUM CHLORIDE CRYS ER 20 MEQ PO TBCR: 20.0000 meq | EXTENDED_RELEASE_TABLET | Freq: Every day | ORAL | Status: DC

## 2011-05-10 MED ORDER — POTASSIUM CHLORIDE CRYS ER 20 MEQ PO TBCR
40.0000 meq | EXTENDED_RELEASE_TABLET | Freq: Once | ORAL | Status: AC
  Administered 2011-05-10: 40 meq via ORAL
  Filled 2011-05-10: qty 2

## 2011-05-10 MED ORDER — POTASSIUM CHLORIDE CRYS ER 20 MEQ PO TBCR
20.0000 meq | EXTENDED_RELEASE_TABLET | Freq: Every day | ORAL | Status: DC
Start: 2011-05-10 — End: 2011-05-10
  Administered 2011-05-10: 20 meq via ORAL
  Filled 2011-05-10: qty 1

## 2011-05-10 NOTE — Discharge Summary (Addendum)
Discharge Summary  Phillip Maldonado MR#: [id number]  DOB:May 07, 1950  Date of Admission: 05/08/2011 Date of Discharge: 05/10/2011  Patient's PCP: Warrick Parisian, MD, MD  Attending Physician:Addisen Chappelle   Discharge Diagnoses: Principal Problem:  *Chest pain Active Problems:  Hypokalemia  Constipation   Brief Admitting History and Physical Phillip Maldonado is an 61 y.o. male presenting with complaints of Chest Pain. He states that he was on his way to his PCP's office because of problems he had been having over the past week of constipation, and was worried he may be impacted. He has a history of multiple ABD surgeries and has problems in the past with impactions. On the way he began to have severe substernal chest pain that felt like heaviness in his chest, and the discomfort was not relieved until after he had taken 2 SL Nitroglycerin tabs. He rated the pain as a 7-8 out of 10. When he arrived at his PCP's office, he was sent to the ED   Discharge Medications Medication List  As of 05/10/2011 12:57 PM   TAKE these medications         ANDROGEL PUMP 1.25 GM/ACT (1%) Gel   Generic drug: Testosterone   Place 1 application onto the skin daily.      aspirin 325 MG EC tablet   Take 325 mg by mouth daily.      desvenlafaxine 50 MG 24 hr tablet   Commonly known as: PRISTIQ   Take 50 mg by mouth daily.      docusate sodium 100 MG capsule   Commonly known as: COLACE   Take 200 mg by mouth every morning.      fexofenadine 180 MG tablet   Commonly known as: ALLEGRA   Take 180 mg by mouth daily.      gabapentin 800 MG tablet   Commonly known as: NEURONTIN   Take 800-2,400 mg by mouth 2 (two) times daily. Take 800 mg in the morning and 2400 mg at night      HYDROmorphone 2 MG tablet   Commonly known as: DILAUDID   Take 6 mg by mouth 3 (three) times daily.      lactulose 10 GM/15ML solution   Commonly known as: CHRONULAC   Take 30 mLs (20 g total) by mouth daily as needed  (for bowel movement).      methadone 10 MG tablet   Commonly known as: DOLOPHINE   Take 30 mg by mouth 2 (two) times daily.      metoprolol 50 MG tablet   Commonly known as: LOPRESSOR   Take 25 mg by mouth 2 (two) times daily.      mulitivitamin with minerals Tabs   Take 1 tablet by mouth daily.      nitroGLYCERIN 0.4 MG SL tablet   Commonly known as: NITROSTAT   Place 0.4 mg under the tongue every 5 (five) minutes as needed. For chest pain      polyvinyl alcohol 1.4 % ophthalmic solution   Commonly known as: LIQUIFILM TEARS   Place 1 drop into both eyes daily as needed. For dry eyes      potassium chloride SA 20 MEQ tablet   Commonly known as: K-DUR,KLOR-CON   Take 1 tablet (20 mEq total) by mouth daily.      senna 8.6 MG Tabs   Commonly known as: SENOKOT   Take 2 tablets (17.2 mg total) by mouth at bedtime.      sodium chloride 0.65 % nasal spray  Commonly known as: OCEAN   Place 1 spray into the nose 3 (three) times daily as needed. For nasal irritation      traZODone 100 MG tablet   Commonly known as: DESYREL   Take 300 mg by mouth at bedtime.      triamcinolone cream 0.1 %   Commonly known as: KENALOG   Apply 1 application topically daily. Apply to groin            Hospital Course:   .Chest pain- EKG and CE unremarkable- an ASA, BB, statin, needs follow with an outpatient cardiologist  .Hypokalemia- unsure what the etiology, patient says he takes lasix but this was not on his home med list, ? If patient is to have lasix will need daily dose of K dur, follow up with PCP for repeat BMP  .Constipation- most likely related to chronic pain medication use, needs good bowel regimin, will give PRN lactulose for now, higher potassium level would also benefit patient  Chronic Pain- follows with a pain doctor  Spent about 45 minutes talking with patient about the need to take care of himself and how he has long term problems that need close outpatient follow up.  I  spoke with him about the benefit of counseling either with a doctor or spiritual.  He has not seen a PCP in about 1 year per patient.  Spoke about the risks/problems of not caring for himself properly.  Per patient he has been noncompliant with diet and follow ups.   Day of Discharge BP 140/95  Pulse 76  Temp(Src) 97.6 F (36.4 C) (Oral)  Resp 18  Ht 5\' 11"  (1.803 m)  Wt 178.309 kg (393 lb 1.6 oz)  BMI 54.83 kg/m2  SpO2 96%  Results for orders placed during the hospital encounter of 05/08/11 (from the past 48 hour(s))  PRO B NATRIURETIC PEPTIDE     Status: Normal   Collection Time   05/08/11  7:57 PM      Component Value Range Comment   Pro B Natriuretic peptide (BNP) 84.9  0 - 125 (pg/mL)   CBC     Status: Normal   Collection Time   05/08/11  7:57 PM      Component Value Range Comment   WBC 9.9  4.0 - 10.5 (K/uL)    RBC 4.56  4.22 - 5.81 (MIL/uL)    Hemoglobin 14.4  13.0 - 17.0 (g/dL)    HCT 86.5  78.4 - 69.6 (%)    MCV 92.3  78.0 - 100.0 (fL)    MCH 31.6  26.0 - 34.0 (pg)    MCHC 34.2  30.0 - 36.0 (g/dL)    RDW 29.5  28.4 - 13.2 (%)    Platelets 234  150 - 400 (K/uL)   DIFFERENTIAL     Status: Normal   Collection Time   05/08/11  7:57 PM      Component Value Range Comment   Neutrophils Relative 58  43 - 77 (%)    Neutro Abs 5.8  1.7 - 7.7 (K/uL)    Lymphocytes Relative 30  12 - 46 (%)    Lymphs Abs 3.0  0.7 - 4.0 (K/uL)    Monocytes Relative 9  3 - 12 (%)    Monocytes Absolute 0.9  0.1 - 1.0 (K/uL)    Eosinophils Relative 2  0 - 5 (%)    Eosinophils Absolute 0.2  0.0 - 0.7 (K/uL)    Basophils Relative 1  0 -  1 (%)    Basophils Absolute 0.1  0.0 - 0.1 (K/uL)   COMPREHENSIVE METABOLIC PANEL     Status: Abnormal   Collection Time   05/08/11  7:57 PM      Component Value Range Comment   Sodium 136  135 - 145 (mEq/L)    Potassium 2.9 (*) 3.5 - 5.1 (mEq/L)    Chloride 88 (*) 96 - 112 (mEq/L)    CO2 36 (*) 19 - 32 (mEq/L)    Glucose, Bld 124 (*) 70 - 99 (mg/dL)    BUN  21  6 - 23 (mg/dL)    Creatinine, Ser 1.61  0.50 - 1.35 (mg/dL)    Calcium 9.7  8.4 - 10.5 (mg/dL)    Total Protein 7.3  6.0 - 8.3 (g/dL)    Albumin 3.6  3.5 - 5.2 (g/dL)    AST 25  0 - 37 (U/L)    ALT 30  0 - 53 (U/L)    Alkaline Phosphatase 71  39 - 117 (U/L)    Total Bilirubin 0.3  0.3 - 1.2 (mg/dL)    GFR calc non Af Amer 67 (*) >90 (mL/min)    GFR calc Af Amer 78 (*) >90 (mL/min)   APTT     Status: Normal   Collection Time   05/08/11  7:57 PM      Component Value Range Comment   aPTT 26  24 - 37 (seconds)   PROTIME-INR     Status: Normal   Collection Time   05/08/11  7:57 PM      Component Value Range Comment   Prothrombin Time 13.0  11.6 - 15.2 (seconds)    INR 0.96  0.00 - 1.49    POCT I-STAT TROPONIN I     Status: Normal   Collection Time   05/08/11  9:54 PM      Component Value Range Comment   Troponin i, poc 0.00  0.00 - 0.08 (ng/mL)    Comment 3            CARDIAC PANEL(CRET KIN+CKTOT+MB+TROPI)     Status: Normal   Collection Time   05/09/11  1:25 AM      Component Value Range Comment   Total CK 206  7 - 232 (U/L)    CK, MB 3.0  0.3 - 4.0 (ng/mL)    Troponin I <0.30  <0.30 (ng/mL)    Relative Index 1.5  0.0 - 2.5    BASIC METABOLIC PANEL     Status: Abnormal   Collection Time   05/09/11  7:00 AM      Component Value Range Comment   Sodium 137  135 - 145 (mEq/L)    Potassium 2.7 (*) 3.5 - 5.1 (mEq/L)    Chloride 91 (*) 96 - 112 (mEq/L)    CO2 35 (*) 19 - 32 (mEq/L)    Glucose, Bld 118 (*) 70 - 99 (mg/dL)    BUN 21  6 - 23 (mg/dL)    Creatinine, Ser 0.96  0.50 - 1.35 (mg/dL)    Calcium 9.4  8.4 - 10.5 (mg/dL)    GFR calc non Af Amer 88 (*) >90 (mL/min)    GFR calc Af Amer >90  >90 (mL/min)   CBC     Status: Normal   Collection Time   05/09/11  7:00 AM      Component Value Range Comment   WBC 8.2  4.0 - 10.5 (K/uL)    RBC 4.46  4.22 - 5.81 (MIL/uL)    Hemoglobin 14.0  13.0 - 17.0 (g/dL)    HCT 16.1  09.6 - 04.5 (%)    MCV 91.9  78.0 - 100.0 (fL)    MCH  31.4  26.0 - 34.0 (pg)    MCHC 34.1  30.0 - 36.0 (g/dL)    RDW 40.9  81.1 - 91.4 (%)    Platelets 206  150 - 400 (K/uL)   CARDIAC PANEL(CRET KIN+CKTOT+MB+TROPI)     Status: Normal   Collection Time   05/09/11  9:40 AM      Component Value Range Comment   Total CK 183  7 - 232 (U/L)    CK, MB 3.2  0.3 - 4.0 (ng/mL)    Troponin I <0.30  <0.30 (ng/mL)    Relative Index 1.7  0.0 - 2.5    MAGNESIUM     Status: Normal   Collection Time   05/09/11  9:40 AM      Component Value Range Comment   Magnesium 2.0  1.5 - 2.5 (mg/dL)   BASIC METABOLIC PANEL     Status: Abnormal   Collection Time   05/09/11  5:58 PM      Component Value Range Comment   Sodium 134 (*) 135 - 145 (mEq/L)    Potassium 3.6  3.5 - 5.1 (mEq/L) HEMOLYSIS AT THIS LEVEL MAY AFFECT RESULT   Chloride 90 (*) 96 - 112 (mEq/L)    CO2 33 (*) 19 - 32 (mEq/L)    Glucose, Bld 161 (*) 70 - 99 (mg/dL)    BUN 17  6 - 23 (mg/dL)    Creatinine, Ser 7.82  0.50 - 1.35 (mg/dL)    Calcium 9.8  8.4 - 10.5 (mg/dL)    GFR calc non Af Amer >90  >90 (mL/min)    GFR calc Af Amer >90  >90 (mL/min)   CARDIAC PANEL(CRET KIN+CKTOT+MB+TROPI)     Status: Normal   Collection Time   05/09/11  6:00 PM      Component Value Range Comment   Total CK 159  7 - 232 (U/L)    CK, MB 3.0  0.3 - 4.0 (ng/mL)    Troponin I <0.30  <0.30 (ng/mL)    Relative Index 1.9  0.0 - 2.5    BASIC METABOLIC PANEL     Status: Abnormal   Collection Time   05/10/11  4:55 AM      Component Value Range Comment   Sodium 137  135 - 145 (mEq/L)    Potassium 3.4 (*) 3.5 - 5.1 (mEq/L)    Chloride 93 (*) 96 - 112 (mEq/L)    CO2 34 (*) 19 - 32 (mEq/L)    Glucose, Bld 113 (*) 70 - 99 (mg/dL)    BUN 15  6 - 23 (mg/dL)    Creatinine, Ser 9.56  0.50 - 1.35 (mg/dL)    Calcium 9.8  8.4 - 10.5 (mg/dL)    GFR calc non Af Amer >90  >90 (mL/min)    GFR calc Af Amer >90  >90 (mL/min)     Dg Chest Port 1 View  05/08/2011  *RADIOLOGY REPORT*  Clinical Data: Shortness of breath.  History of  CHF.  PORTABLE CHEST - 1 VIEW 05/08/2011:  Comparison: None.  Findings: Suboptimal inspiration due to body habitus which accounts for crowded bronchovascular markings at the bases and accentuates the cardiac silhouette.  Taking this into account, cardiac silhouette mildly enlarged.  Lungs clear.  No visible  pleural effusions.  Surgical clips in the left upper quadrant of the abdomen at the esophagogastric junction.  Prior cervical spine fusion.  IMPRESSION: Suboptimal inspiration.  No acute cardiopulmonary disease.  Mild cardiomegaly.  Original Report Authenticated By: Arnell Sieving, M.D.   Dg Abd Acute W/chest  05/08/2011  *RADIOLOGY REPORT*  Clinical Data: 61 year old male with chest and abdominal pain. Abdominal distention, shortness of breath.  ACUTE ABDOMEN SERIES (ABDOMEN 2 VIEW & CHEST 1 VIEW)  Comparison: Portable abdomen and chest radiograph from earlier the same day.  Findings: PA views of the chest.  Low lung volumes.  Curvilinear opacity at the left base most resembles atelectasis.  Epigastric surgical clips.  Cardiac size and mediastinal contours are within normal limits.  No pneumothorax or pneumoperitoneum.  Sequelae of lumbar fusion. Nonobstructed bowel gas pattern. No acute osseous abnormality identified.  IMPRESSION: 1. Nonobstructed bowel gas pattern, no free air. 2.  Low lung volumes with left basilar atelectasis.  Original Report Authenticated By: Harley Hallmark, M.D.   Dg Abd Portable 1v  05/08/2011  *RADIOLOGY REPORT*  Clinical Data: 61 year old male with abdominal pain.  PORTABLE ABDOMEN - 1 VIEW  Comparison:  None  Findings: The bowel gas pattern is normal.  No radio-opaque calculi or other significant radiographic abnormality is seen. Lumbar spine surgical hardware is identified. The study is limited by patient body habitus.  IMPRESSION: No evidence of acute abnormality.  The study is limited by patient body habitus.  Original Report Authenticated By: Rosendo Gros, M.D.      Disposition: home  Diet: cardiac  Activity:  As tolerated   Follow-up Appts: PCP- 1 week  Discharge Orders    Future Orders Please Complete By Expires   Diet - low sodium heart healthy      Increase activity slowly      Discharge instructions      Comments:   PCP for recheck of potassium      TESTS THAT NEED FOLLOW-UP Repeat BMP in family dr office  Time spent on discharge, talking to the patient, and coordinating care:75 mins.   SignedMarlin Canary, DO 05/10/2011, 12:57 PM

## 2011-05-10 NOTE — Progress Notes (Signed)
D/c instructions given to pt including f/u care and appointments, home medications, diet/activity restrictions and s/s of when to notify MD.  Pt verbalized understanding with no question asked.  IV and telemetry removed from pt.  Will transport pt via wheelchair to car when pt ready.

## 2011-05-10 NOTE — Progress Notes (Signed)
Subjective: No further episodes of chest pain. No fevers/no chills Only one BM since admission    Objective: Vital signs in last 24 hours: Filed Vitals:   05/09/11 0959 05/09/11 1457 05/09/11 2110 05/10/11 0532  BP: 119/82 112/56 152/85 100/60  Pulse: 79 68 77 66  Temp: 97.6 F (36.4 C) 97.6 F (36.4 C) 98.2 F (36.8 C) 97.3 F (36.3 C)  TempSrc: Oral Oral Oral Oral  Resp: 18 19 18 17   Height:      Weight:    178.309 kg (393 lb 1.6 oz)  SpO2: 95% 90% 94% 92%   Weight change: 1.406 kg (3 lb 1.6 oz)  Intake/Output Summary (Last 24 hours) at 05/10/11 0846 Last data filed at 05/10/11 0645  Gross per 24 hour  Intake   1440 ml  Output   1100 ml  Net    340 ml    Physical Exam: General: Awake, Oriented, No acute distress. HEENT: EOMI. Neck: Supple CV: S1 and S2,rrr Lungs: Clear to ascultation bilaterally,no wheeznig, dec bs secondary to size Abdomen: Soft, Nontender, Nondistended, +bowel sound- obese. Ext: Good pulses. Trace edema.   Lab Results:  Basename 05/10/11 0455 05/09/11 1758 05/09/11 0940  NA 137 134* --  K 3.4* 3.6 --  CL 93* 90* --  CO2 34* 33* --  GLUCOSE 113* 161* --  BUN 15 17 --  CREATININE 0.83 0.87 --  CALCIUM 9.8 9.8 --  MG -- -- 2.0  PHOS -- -- --    Basename 05/08/11 1957  AST 25  ALT 30  ALKPHOS 71  BILITOT 0.3  PROT 7.3  ALBUMIN 3.6   No results found for this basename: LIPASE:2,AMYLASE:2 in the last 72 hours  Basename 05/09/11 0700 05/08/11 1957  WBC 8.2 9.9  NEUTROABS -- 5.8  HGB 14.0 14.4  HCT 41.0 42.1  MCV 91.9 92.3  PLT 206 234    Basename 05/09/11 1800 05/09/11 0940 05/09/11 0125  CKTOTAL 159 183 206  CKMB 3.0 3.2 3.0  CKMBINDEX -- -- --  TROPONINI <0.30 <0.30 <0.30   No components found with this basename: POCBNP:3 No results found for this basename: DDIMER:2 in the last 72 hours No results found for this basename: HGBA1C:2 in the last 72 hours No results found for this basename:  CHOL:2,HDL:2,LDLCALC:2,TRIG:2,CHOLHDL:2,LDLDIRECT:2 in the last 72 hours No results found for this basename: TSH,T4TOTAL,FREET3,T3FREE,THYROIDAB in the last 72 hours No results found for this basename: VITAMINB12:2,FOLATE:2,FERRITIN:2,TIBC:2,IRON:2,RETICCTPCT:2 in the last 72 hours  Micro Results: No results found for this or any previous visit (from the past 240 hour(s)).  Studies/Results: Dg Chest Port 1 View  05/08/2011  *RADIOLOGY REPORT*  Clinical Data: Shortness of breath.  History of CHF.  PORTABLE CHEST - 1 VIEW 05/08/2011:  Comparison: None.  Findings: Suboptimal inspiration due to body habitus which accounts for crowded bronchovascular markings at the bases and accentuates the cardiac silhouette.  Taking this into account, cardiac silhouette mildly enlarged.  Lungs clear.  No visible pleural effusions.  Surgical clips in the left upper quadrant of the abdomen at the esophagogastric junction.  Prior cervical spine fusion.  IMPRESSION: Suboptimal inspiration.  No acute cardiopulmonary disease.  Mild cardiomegaly.  Original Report Authenticated By: Arnell Sieving, M.D.   Dg Abd Acute W/chest  05/08/2011  *RADIOLOGY REPORT*  Clinical Data: 61 year old male with chest and abdominal pain. Abdominal distention, shortness of breath.  ACUTE ABDOMEN SERIES (ABDOMEN 2 VIEW & CHEST 1 VIEW)  Comparison: Portable abdomen and chest radiograph from earlier the same  day.  Findings: PA views of the chest.  Low lung volumes.  Curvilinear opacity at the left base most resembles atelectasis.  Epigastric surgical clips.  Cardiac size and mediastinal contours are within normal limits.  No pneumothorax or pneumoperitoneum.  Sequelae of lumbar fusion. Nonobstructed bowel gas pattern. No acute osseous abnormality identified.  IMPRESSION: 1. Nonobstructed bowel gas pattern, no free air. 2.  Low lung volumes with left basilar atelectasis.  Original Report Authenticated By: Harley Hallmark, M.D.   Dg Abd Portable  1v  05/08/2011  *RADIOLOGY REPORT*  Clinical Data: 61 year old male with abdominal pain.  PORTABLE ABDOMEN - 1 VIEW  Comparison:  None  Findings: The bowel gas pattern is normal.  No radio-opaque calculi or other significant radiographic abnormality is seen. Lumbar spine surgical hardware is identified. The study is limited by patient body habitus.  IMPRESSION: No evidence of acute abnormality.  The study is limited by patient body habitus.  Original Report Authenticated By: Rosendo Gros, M.D.    Medications: I have reviewed the patient's current medications. Scheduled Meds:    . aspirin EC  325 mg Oral Daily  . docusate sodium  100 mg Oral BID  . gabapentin  800 mg Oral Daily  . gabapentin  2,400 mg Oral QHS  . lactulose  20 g Oral Daily  . loratadine  10 mg Oral Daily  . methadone  30 mg Oral BID  . metoprolol  25 mg Oral BID  . mulitivitamin with minerals  1 tablet Oral Daily  . nitroGLYCERIN  0.2 mg Transdermal Daily  . potassium chloride  10 mEq Intravenous Q1 Hr x 2  . potassium chloride  40 mEq Oral BID  . potassium chloride  40 mEq Oral Once  . senna  2 tablet Oral QHS  . testosterone  2.5 g Transdermal Daily  . traZODone  300 mg Oral QHS  . triamcinolone cream  1 application Topical Daily  . venlafaxine  75 mg Oral Q breakfast  . DISCONTD: enoxaparin  80 mg Subcutaneous Daily  . DISCONTD: metoprolol  25 mg Oral BID   Continuous Infusions:  PRN Meds:.sodium chloride, acetaminophen, acetaminophen, alum & mag hydroxide-simeth, ondansetron (ZOFRAN) IV, ondansetron, oxyCODONE, polyvinyl alcohol, sodium chloride, sodium chloride, zolpidem  Assessment/Plan:   *Chest pain, CE negative, can follow up with cardiologist as outpatient, has had stress tests in the past   Hypokalemia- repleat, start daily home dose , with follow up BMP, Mg ok   Constipation- resolved with bowel movement, counseled patient on the fact that he is on chronic pain meds and needs to have a strict bowel  regimen to avoid constipation- PRN lactulose may benefit him  Chronic pain- methadone     LOS: 2 days  Phillip Dehne, DO 05/10/2011, 8:46 AM

## 2011-05-12 NOTE — Progress Notes (Signed)
Utilization review complete 

## 2011-07-30 ENCOUNTER — Other Ambulatory Visit (HOSPITAL_COMMUNITY): Payer: Self-pay | Admitting: Cardiology

## 2011-07-30 DIAGNOSIS — R0602 Shortness of breath: Secondary | ICD-10-CM

## 2011-08-25 ENCOUNTER — Institutional Professional Consult (permissible substitution): Payer: Medicare Other | Admitting: Pulmonary Disease

## 2011-09-16 ENCOUNTER — Encounter: Payer: Self-pay | Admitting: Pulmonary Disease

## 2011-09-18 ENCOUNTER — Ambulatory Visit (INDEPENDENT_AMBULATORY_CARE_PROVIDER_SITE_OTHER): Payer: Medicare Other | Admitting: Pulmonary Disease

## 2011-09-18 ENCOUNTER — Encounter: Payer: Self-pay | Admitting: Pulmonary Disease

## 2011-09-18 VITALS — BP 100/62 | HR 89 | Temp 98.2°F | Ht 72.0 in | Wt >= 6400 oz

## 2011-09-18 DIAGNOSIS — E7800 Pure hypercholesterolemia, unspecified: Secondary | ICD-10-CM | POA: Insufficient documentation

## 2011-09-18 DIAGNOSIS — G4730 Sleep apnea, unspecified: Secondary | ICD-10-CM

## 2011-09-18 DIAGNOSIS — G4733 Obstructive sleep apnea (adult) (pediatric): Secondary | ICD-10-CM | POA: Insufficient documentation

## 2011-09-18 DIAGNOSIS — I10 Essential (primary) hypertension: Secondary | ICD-10-CM | POA: Insufficient documentation

## 2011-09-18 DIAGNOSIS — E119 Type 2 diabetes mellitus without complications: Secondary | ICD-10-CM

## 2011-09-18 HISTORY — DX: Type 2 diabetes mellitus without complications: E11.9

## 2011-09-18 NOTE — Assessment & Plan Note (Signed)
The patient apparently has a history of sleep apnea diagnosed greater than 10 years ago, but unfortunately I have no access to that study.  He has used a CPAP device in the past, but has not used it in quite some time.  He clearly has sleep apnea by his history, and severe daytime sleepiness.  He also has significant underlying cardiovascular disease that can be greatly impacted by his sleep apnea.  At this point, the patient needs to have a sleep study done for documentation.  Will start him back on CPAP once this is done.  I have also encouraged him to work aggressively on weight loss, and reminded him of his moral responsibility to not drive if he is sleepy.

## 2011-09-18 NOTE — Patient Instructions (Addendum)
Will send for a sleep study to evaluate for sleep apnea.  Will arrange followup with me once the results are available. Work on weight loss

## 2011-09-18 NOTE — Progress Notes (Signed)
  Subjective:    Patient ID: Phillip Maldonado, male    DOB: Dec 15, 1950, 61 y.o.   MRN: [id number]  HPI The patient is a 61 year old male who had been asked to see for possible obstructive sleep apnea.  The patient was apparently diagnosed with sleep apnea more than 10 years ago, and was started on CPAP for treatment.  He used this for a few years, then used only on an inconsistent basis, and has not used at all lately because of malfunctioning of his device.  The patient notes loud snoring as well as an abnormal breathing pattern during sleep, and has frequent awakenings at night.  He is not rested in the mornings, and has severe sleepiness during the day with any period of inactivity.  He will fall asleep anytime he sits down, and also has sleepiness with driving.  He states that his weight is up 100 pounds over the last 2 years, and his Epworth score today is 24.  Sleep Questionnaire: What time do you typically go to bed?( Between what hours) 1-2 a.m. How long does it take you to fall asleep? 5 minutes How many times during the night do you wake up? What time do you get out of bed to start your day? 0900 Do you drive or operate heavy machinery in your occupation? No How much has your weight changed (up or down) over the past two years? (In pounds) 100 lb (45.36 kg) Have you ever had a sleep study before? Yes If yes, location of study? Somewhere in [place] If yes, date of study? Do you currently use CPAP? No Do you wear oxygen at any time? No     Review of Systems  Constitutional: Negative for fever and unexpected weight change.  HENT: Positive for congestion, sore throat, trouble swallowing and dental problem. Negative for ear pain, nosebleeds, rhinorrhea, sneezing, postnasal drip and sinus pressure.   Eyes: Negative for redness and itching.  Respiratory: Positive for cough and shortness of breath. Negative for chest tightness and wheezing.   Cardiovascular: Positive for chest pain,  palpitations and leg swelling.  Gastrointestinal: Positive for abdominal pain. Negative for nausea and vomiting.  Genitourinary: Negative for dysuria.  Musculoskeletal: Positive for arthralgias. Negative for joint swelling.  Skin: Positive for rash.  Neurological: Positive for headaches.  Hematological: Does not bruise/bleed easily.  Psychiatric/Behavioral: Negative for dysphoric mood. The patient is nervous/anxious.   All other systems reviewed and are negative.       Objective:   Physical Exam Constitutional:  Morbidly obese male, no acute distress  HENT:  Nares patent without discharge, but narrowed bilat.  Oropharynx without exudate, palate and uvula are thick and elongated.  Eyes:  Perrla, eomi, no scleral icterus  Neck:  No JVD, no TMG  Cardiovascular:  Normal rate, regular rhythm, no rubs or gallops.  No murmurs        Intact distal pulses but decreased.   Pulmonary :  Normal breath sounds, no stridor or respiratory distress   No rales, rhonchi, or wheezing  Abdominal:  Soft, nondistended, bowel sounds present.  No tenderness noted.   Musculoskeletal:  2+ lower extremity edema noted.  Lymph Nodes:  No cervical lymphadenopathy noted  Skin:  No cyanosis noted  Neurologic:  Appears sleepy, appropriate, moves all 4 extremities without obvious deficit.         Assessment & Plan:

## 2011-10-05 ENCOUNTER — Ambulatory Visit (HOSPITAL_BASED_OUTPATIENT_CLINIC_OR_DEPARTMENT_OTHER): Payer: Medicare Other | Attending: Pulmonary Disease

## 2011-10-05 VITALS — Ht 72.0 in | Wt >= 6400 oz

## 2011-10-05 DIAGNOSIS — G4730 Sleep apnea, unspecified: Secondary | ICD-10-CM

## 2011-10-05 DIAGNOSIS — G4733 Obstructive sleep apnea (adult) (pediatric): Secondary | ICD-10-CM | POA: Insufficient documentation

## 2011-10-17 DIAGNOSIS — G4733 Obstructive sleep apnea (adult) (pediatric): Secondary | ICD-10-CM

## 2011-10-17 NOTE — Procedures (Signed)
Phillip Maldonado, Phillip Maldonado            ACCOUNT NO.:  [id number]  MEDICAL RECORD NO.:  [id number]          PATIENT TYPE:  OUT  LOCATION:  SLEEP CENTER                 FACILITY:  [facility]  PHYSICIAN:  Barbaraann Share, MD,FCCPDATE OF BIRTH:  1951/03/06  DATE OF STUDY:  10/05/2011                           NOCTURNAL POLYSOMNOGRAM  REFERRING PHYSICIAN:  Barbaraann Share, MD,FCCP  INDICATION FOR STUDY:  Hypersomnia with sleep apnea.  EPWORTH SLEEPINESS SCORE:  24.  MEDICATIONS:  SLEEP ARCHITECTURE:  The patient had a total sleep time of 332 minutes with no slow-wave sleep or REM being achieved.  Sleep onset latency was normal at 5.5 minutes, and sleep efficiency was mildly reduced at 89%.  RESPIRATORY DATA:  The patient was found to have 198 apneas as well as 195 obstructive hypopneas, giving him an apnea-hypopnea index of 71 events per hour.  The events occurred in all body positions, and there was loud snoring noted throughout.  OXYGEN DATA:  There was O2 desaturation as low as 80% with the patient's obstructive events.  CARDIAC DATA:  No clinically significant arrhythmias were noted.  MOVEMENTS-PARASOMNIA:  The patient had no significant leg jerks or other abnormal behaviors noted.  IMPRESSION-RECOMMENDATION: Severe obstructive sleep apnea/hypopnea syndrome with an AHI of 71 events per hour and oxygen desaturation as low as 80%.  Treatment for this degree of sleep apnea should focus primarily on CPAP, as well as aggressive weight loss.     Barbaraann Share, MD,FCCP Diplomate, American Board of Sleep Medicine    KMC/MEDQ  D:  10/17/2011 14:36:39  T:  10/17/2011 20:05:44  Job:  [id number]

## 2011-11-10 ENCOUNTER — Encounter (HOSPITAL_BASED_OUTPATIENT_CLINIC_OR_DEPARTMENT_OTHER): Payer: Medicare Other | Attending: General Surgery

## 2011-11-10 DIAGNOSIS — E7800 Pure hypercholesterolemia, unspecified: Secondary | ICD-10-CM | POA: Insufficient documentation

## 2011-11-10 DIAGNOSIS — I83009 Varicose veins of unspecified lower extremity with ulcer of unspecified site: Secondary | ICD-10-CM | POA: Insufficient documentation

## 2011-11-10 DIAGNOSIS — Z79899 Other long term (current) drug therapy: Secondary | ICD-10-CM | POA: Insufficient documentation

## 2011-11-10 DIAGNOSIS — E119 Type 2 diabetes mellitus without complications: Secondary | ICD-10-CM | POA: Insufficient documentation

## 2011-11-10 DIAGNOSIS — I10 Essential (primary) hypertension: Secondary | ICD-10-CM | POA: Insufficient documentation

## 2011-11-10 DIAGNOSIS — L97909 Non-pressure chronic ulcer of unspecified part of unspecified lower leg with unspecified severity: Secondary | ICD-10-CM | POA: Insufficient documentation

## 2011-11-10 DIAGNOSIS — G4730 Sleep apnea, unspecified: Secondary | ICD-10-CM | POA: Insufficient documentation

## 2011-11-10 DIAGNOSIS — I509 Heart failure, unspecified: Secondary | ICD-10-CM | POA: Insufficient documentation

## 2011-11-10 DIAGNOSIS — E876 Hypokalemia: Secondary | ICD-10-CM | POA: Insufficient documentation

## 2011-11-10 NOTE — H&P (Signed)
Phillip Maldonado, Phillip Maldonado            ACCOUNT NO.:  [id number]  MEDICAL RECORD NO.:  [id number]  LOCATION:  FOOT                         FACILITY:  [facility]  PHYSICIAN:  Joanne Gavel, M.D.        DATE OF BIRTH:  1950/11/03  DATE OF ADMISSION:  11/10/2011 DATE OF DISCHARGE:                             HISTORY & PHYSICAL   CHIEF COMPLAINT:  Wound, right leg.  HISTORY OF PRESENT ILLNESS:  This patient developed a superficial ulceration, left anterior lower extremity.  He denies trauma.  He has venous stasis changes.  He has never had a previous episode of this condition.  This has been treated with antibiotics and with dressings.  PAST MEDICAL HISTORY:  Significant for hyperglycemia, but he has never been diagnosed with diabetes.  He has a history of congestive heart failure and hypertension.  He has hypercholesterolemia, hypokalemia, sleep apnea, and he is morbidly obese.  PAST SURGICAL HISTORY:  He has had several operations for obesity and many back operations.  He is still left with significant pain.  Cigarettes none.  Alcohol none.  ALLERGIES:  None.  MEDICATIONS:  Lasix, losartan, methadone, metaxalone, metoprolol, nystatin, trazodone, venlafaxine, atorvastatin, metformin, gabapentin, amlodipine.  REVIEW OF SYSTEMS:  He says he has never had a heart attack although he has been in congestive heart failure.  He had an episode of GI bleeding after surgery in the 1970s.  PHYSICAL EXAMINATION:  GENERAL:  Well developed, very obese, in no distress. VITAL SIGNS:  Temperature 98.2, pulse 99, respirations 18, blood pressure 171/98. HEENT:  Eyes, ears, nose, throat essentially normal. CHEST:  Clear. HEART:  Regular rhythm. ABDOMEN: Not examined. EXTREMITIES:  Examination of lower extremities reveals peripheral pulses are palpable but weak.  ABI could not be measured in the left lower extremity.  In the midst of redness and swelling and clear-cut venous stasis changes, there is a 1.8  x 1.4 superficial wound.  This is covered with slough which was easily cleared.  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  PLAN OF TREATMENT:  Start with zinc, Silvercel and Profore Lite.  We will get arterial studies and if the arterial studies are adequate, we will proceed to Unna boots.     Joanne Gavel, M.D.     RA/MEDQ  D:  11/10/2011  T:  11/10/2011  Job:  [id number]  cc:   Gloriajean Dell. Andrey Campanile, M.D.

## 2011-11-11 ENCOUNTER — Ambulatory Visit: Payer: Medicare Other | Admitting: Pulmonary Disease

## 2011-11-17 ENCOUNTER — Encounter (HOSPITAL_BASED_OUTPATIENT_CLINIC_OR_DEPARTMENT_OTHER): Payer: Medicare Other | Attending: General Surgery

## 2011-11-17 DIAGNOSIS — L988 Other specified disorders of the skin and subcutaneous tissue: Secondary | ICD-10-CM | POA: Insufficient documentation

## 2011-11-17 DIAGNOSIS — E669 Obesity, unspecified: Secondary | ICD-10-CM | POA: Insufficient documentation

## 2011-11-17 DIAGNOSIS — I83009 Varicose veins of unspecified lower extremity with ulcer of unspecified site: Secondary | ICD-10-CM | POA: Insufficient documentation

## 2011-12-05 DIAGNOSIS — R601 Generalized edema: Secondary | ICD-10-CM | POA: Insufficient documentation

## 2011-12-08 ENCOUNTER — Encounter (HOSPITAL_BASED_OUTPATIENT_CLINIC_OR_DEPARTMENT_OTHER): Payer: Medicare Other

## 2012-01-13 ENCOUNTER — Ambulatory Visit (HOSPITAL_COMMUNITY): Payer: Medicare Other | Admitting: Licensed Clinical Social Worker

## 2012-03-30 ENCOUNTER — Ambulatory Visit: Payer: Medicare Other | Admitting: Pulmonary Disease

## 2012-04-08 ENCOUNTER — Ambulatory Visit: Payer: Medicare Other | Admitting: Pulmonary Disease

## 2012-04-13 ENCOUNTER — Telehealth: Payer: Self-pay | Admitting: *Deleted

## 2012-04-13 NOTE — Telephone Encounter (Signed)
Left detailed message on machine to call out office to schedule an appt.    Patient needs f/u appt to review sleep study per [facility]. Patient has NOS last two appts to review Study.  (1/15 and 1/24)

## 2012-05-02 NOTE — Telephone Encounter (Signed)
Unable to reach patient-lmomtcb x2. Needs appt to review sleep study. Has NOS last 2 appts to review

## 2012-05-12 NOTE — Telephone Encounter (Signed)
Unable to reach patient, will send letter to contact our office. Mailed to patient today.

## 2012-06-08 ENCOUNTER — Ambulatory Visit: Payer: Medicare Other | Admitting: Pulmonary Disease

## 2012-06-15 ENCOUNTER — Encounter: Payer: Self-pay | Admitting: *Deleted

## 2012-06-17 ENCOUNTER — Telehealth: Payer: Self-pay | Admitting: Pulmonary Disease

## 2012-06-17 NOTE — Telephone Encounter (Signed)
Dismissal Letter sent by Certified Mail 06/17/2012  Dismissal Letter returned UnClaimed from [id number] 06/21/2012  Dismissal Letter sent by Certified Mail to [address] [place], [place], [address]  Received the Return Receipt showing someone picked up the Dismissal Letter 06/27/2012

## 2012-12-19 DIAGNOSIS — M12819 Other specific arthropathies, not elsewhere classified, unspecified shoulder: Secondary | ICD-10-CM | POA: Insufficient documentation

## 2013-01-01 ENCOUNTER — Encounter (HOSPITAL_COMMUNITY)
Admission: EM | Disposition: A | Discharge status: Skilled Nursing Facility | Payer: Self-pay | Source: Home / Self Care | Attending: Internal Medicine

## 2013-01-01 ENCOUNTER — Encounter (HOSPITAL_COMMUNITY): Payer: Medicare Other | Admitting: Anesthesiology

## 2013-01-01 ENCOUNTER — Emergency Department (HOSPITAL_COMMUNITY): Payer: Medicare Other | Admitting: Anesthesiology

## 2013-01-01 ENCOUNTER — Encounter (HOSPITAL_COMMUNITY): Payer: Self-pay | Admitting: Emergency Medicine

## 2013-01-01 ENCOUNTER — Emergency Department (HOSPITAL_COMMUNITY): Payer: Medicare Other

## 2013-01-01 ENCOUNTER — Inpatient Hospital Stay (HOSPITAL_COMMUNITY)
Admission: EM | Admit: 2013-01-01 | Discharge: 2013-01-05 | DRG: 378 | Disposition: A | Discharge status: Skilled Nursing Facility | Payer: Medicare Other | Attending: Internal Medicine | Admitting: Internal Medicine

## 2013-01-01 DIAGNOSIS — E7800 Pure hypercholesterolemia, unspecified: Secondary | ICD-10-CM

## 2013-01-01 DIAGNOSIS — F411 Generalized anxiety disorder: Secondary | ICD-10-CM | POA: Diagnosis present

## 2013-01-01 DIAGNOSIS — Z9884 Bariatric surgery status: Secondary | ICD-10-CM

## 2013-01-01 DIAGNOSIS — K922 Gastrointestinal hemorrhage, unspecified: Secondary | ICD-10-CM | POA: Diagnosis present

## 2013-01-01 DIAGNOSIS — F329 Major depressive disorder, single episode, unspecified: Secondary | ICD-10-CM | POA: Diagnosis present

## 2013-01-01 DIAGNOSIS — E119 Type 2 diabetes mellitus without complications: Secondary | ICD-10-CM

## 2013-01-01 DIAGNOSIS — M1990 Unspecified osteoarthritis, unspecified site: Secondary | ICD-10-CM | POA: Diagnosis present

## 2013-01-01 DIAGNOSIS — F3289 Other specified depressive episodes: Secondary | ICD-10-CM | POA: Diagnosis present

## 2013-01-01 DIAGNOSIS — M25519 Pain in unspecified shoulder: Secondary | ICD-10-CM | POA: Diagnosis present

## 2013-01-01 DIAGNOSIS — D62 Acute posthemorrhagic anemia: Secondary | ICD-10-CM | POA: Diagnosis present

## 2013-01-01 DIAGNOSIS — G4733 Obstructive sleep apnea (adult) (pediatric): Secondary | ICD-10-CM | POA: Diagnosis present

## 2013-01-01 DIAGNOSIS — I10 Essential (primary) hypertension: Secondary | ICD-10-CM | POA: Diagnosis present

## 2013-01-01 DIAGNOSIS — E876 Hypokalemia: Secondary | ICD-10-CM

## 2013-01-01 DIAGNOSIS — I509 Heart failure, unspecified: Secondary | ICD-10-CM | POA: Diagnosis present

## 2013-01-01 DIAGNOSIS — Z6841 Body Mass Index (BMI) 40.0 and over, adult: Secondary | ICD-10-CM

## 2013-01-01 HISTORY — PX: ESOPHAGOSCOPY: SHX5534

## 2013-01-01 LAB — COMPREHENSIVE METABOLIC PANEL
BUN: 31 mg/dL — ABNORMAL HIGH (ref 6–23)
Calcium: 8.9 mg/dL (ref 8.4–10.5)
Creatinine, Ser: 0.83 mg/dL (ref 0.50–1.35)
GFR calc Af Amer: >90 mL/min (ref >90)
Glucose, Bld: 263 mg/dL — ABNORMAL HIGH (ref 70–99)
Total Protein: 6.2 g/dL (ref 6.0–8.3)

## 2013-01-01 LAB — TYPE AND SCREEN
ABO/RH(D): O POS
Antibody Screen: NEGATIVE

## 2013-01-01 LAB — POCT I-STAT, CHEM 8
BUN: 40 mg/dL — ABNORMAL HIGH (ref 6–23)
Calcium, Ion: 1.11 mmol/L — ABNORMAL LOW (ref 1.13–1.30)
Chloride: 107 mEq/L (ref 96–112)
HCT: 32 % — ABNORMAL LOW (ref 39.0–52.0)
HCT: 33 % — ABNORMAL LOW (ref 39.0–52.0)
Hemoglobin: 10.9 g/dL — ABNORMAL LOW (ref 13.0–17.0)
Hemoglobin: 11.2 g/dL — ABNORMAL LOW (ref 13.0–17.0)
Potassium: 5.1 mEq/L (ref 3.5–5.1)
Sodium: 139 mEq/L (ref 135–145)
TCO2: 21 mmol/L (ref 0–100)

## 2013-01-01 LAB — GLUCOSE, CAPILLARY: Glucose-Capillary: 272 mg/dL — ABNORMAL HIGH (ref 70–99)

## 2013-01-01 LAB — CBC WITH DIFFERENTIAL/PLATELET
Basophils Absolute: 0 10*3/uL (ref 0.0–0.1)
Basophils Relative: 0 % (ref 0–1)
Eosinophils Relative: 1 % (ref 0–5)
Lymphocytes Relative: 12 % (ref 12–46)
MCHC: 32.8 g/dL (ref 30.0–36.0)
MCV: 92.9 fL (ref 78.0–100.0)
Neutro Abs: 13.7 10*3/uL — ABNORMAL HIGH (ref 1.7–7.7)
Platelets: 264 10*3/uL (ref 150–400)
RBC: 3.54 MIL/uL — ABNORMAL LOW (ref 4.22–5.81)
RDW: 14.2 % (ref 11.5–15.5)
WBC: 16.9 10*3/uL — ABNORMAL HIGH (ref 4.0–10.5)

## 2013-01-01 LAB — OCCULT BLOOD, POC DEVICE: Fecal Occult Bld: POSITIVE — AB

## 2013-01-01 LAB — ABO/RH: ABO/RH(D): O POS

## 2013-01-01 SURGERY — ESOPHAGOSCOPY
Anesthesia: General | Site: Mouth

## 2013-01-01 SURGERY — EGD (ESOPHAGOGASTRODUODENOSCOPY)
Anesthesia: General | Laterality: Left

## 2013-01-01 MED ORDER — SODIUM CHLORIDE 0.9 % IV SOLN
INTRAVENOUS | Status: DC
  Administered 2013-01-02 – 2013-01-03 (×3): via INTRAVENOUS

## 2013-01-01 MED ORDER — FENTANYL CITRATE 0.05 MG/ML IJ SOLN: 25.0000 ug | INTRAMUSCULAR | Status: DC | PRN

## 2013-01-01 MED ORDER — SODIUM CHLORIDE 0.9 % IV BOLUS (SEPSIS)
1000.0000 mL | Freq: Once | INTRAVENOUS | Status: AC
  Administered 2013-01-01: 1000 mL via INTRAVENOUS

## 2013-01-01 MED ORDER — LIDOCAINE HCL (CARDIAC) 20 MG/ML IV SOLN
INTRAVENOUS | Status: DC | PRN
  Administered 2013-01-01 (×2): 100 mg via INTRAVENOUS

## 2013-01-01 MED ORDER — PROPOFOL 10 MG/ML IV BOLUS
INTRAVENOUS | Status: DC | PRN
  Administered 2013-01-01: 300 mg via INTRAVENOUS
  Administered 2013-01-01: 100 mg via INTRAVENOUS

## 2013-01-01 MED ORDER — LIDOCAINE VISCOUS 2 % MT SOLN
15.0000 mL | Freq: Once | OROMUCOSAL | Status: AC
  Administered 2013-01-01: 15 mL via OROMUCOSAL
  Filled 2013-01-01: qty 15

## 2013-01-01 MED ORDER — SODIUM CHLORIDE 0.9 % IV SOLN
8.0000 mg/h | INTRAVENOUS | Status: DC
  Administered 2013-01-02 – 2013-01-04 (×6): 8 mg/h via INTRAVENOUS
  Filled 2013-01-01 (×12): qty 80

## 2013-01-01 MED ORDER — ESMOLOL HCL 10 MG/ML IV SOLN
INTRAVENOUS | Status: DC | PRN
  Administered 2013-01-01 (×2): 10 mg via INTRAVENOUS

## 2013-01-01 MED ORDER — PANTOPRAZOLE SODIUM 40 MG IV SOLR: 40.0000 mg | Freq: Two times a day (BID) | INTRAVENOUS | Status: DC

## 2013-01-01 MED ORDER — OXYMETAZOLINE HCL 0.05 % NA SOLN
1.0000 | Freq: Once | NASAL | Status: AC
  Administered 2013-01-01: 1 via NASAL
  Filled 2013-01-01: qty 15

## 2013-01-01 MED ORDER — PANTOPRAZOLE SODIUM 40 MG IV SOLR
40.0000 mg | Freq: Once | INTRAVENOUS | Status: AC
  Administered 2013-01-01: 40 mg via INTRAVENOUS
  Filled 2013-01-01: qty 40

## 2013-01-01 MED ORDER — SODIUM CHLORIDE 0.9 % IV SOLN: INTRAVENOUS | Status: DC

## 2013-01-01 MED ORDER — SODIUM CHLORIDE 0.9 % IV SOLN
INTRAVENOUS | Status: DC | PRN
  Administered 2013-01-01: 18:00:00 via INTRAVENOUS

## 2013-01-01 MED ORDER — SUCCINYLCHOLINE CHLORIDE 20 MG/ML IJ SOLN
INTRAMUSCULAR | Status: DC | PRN
  Administered 2013-01-01: 180 mg via INTRAVENOUS

## 2013-01-01 MED ORDER — LORAZEPAM 2 MG/ML IJ SOLN
1.0000 mg | Freq: Once | INTRAMUSCULAR | Status: AC
  Administered 2013-01-01: 1 mg via INTRAVENOUS
  Filled 2013-01-01: qty 1

## 2013-01-01 MED ORDER — LACTATED RINGERS IV SOLN: INTRAVENOUS | Status: DC | PRN

## 2013-01-01 MED ORDER — PROMETHAZINE HCL 25 MG/ML IJ SOLN: 6.2500 mg | INTRAMUSCULAR | Status: DC | PRN

## 2013-01-01 MED ORDER — INSULIN ASPART 100 UNIT/ML ~~LOC~~ SOLN
0.0000 [IU] | SUBCUTANEOUS | Status: DC
  Administered 2013-01-02: 3 [IU] via SUBCUTANEOUS
  Administered 2013-01-02: 5 [IU] via SUBCUTANEOUS
  Administered 2013-01-02: 3 [IU] via SUBCUTANEOUS
  Administered 2013-01-02: 5 [IU] via SUBCUTANEOUS
  Administered 2013-01-02: 8 [IU] via SUBCUTANEOUS
  Administered 2013-01-03 (×2): 2 [IU] via SUBCUTANEOUS
  Filled 2013-01-01 (×54): qty 0.15

## 2013-01-01 MED ORDER — ONDANSETRON HCL 4 MG/2ML IJ SOLN
4.0000 mg | Freq: Once | INTRAMUSCULAR | Status: AC
  Administered 2013-01-01: 4 mg via INTRAVENOUS
  Filled 2013-01-01: qty 2

## 2013-01-01 MED ORDER — FENTANYL CITRATE 0.05 MG/ML IJ SOLN
INTRAMUSCULAR | Status: DC | PRN
  Administered 2013-01-01: 150 ug via INTRAVENOUS
  Administered 2013-01-01: 100 ug via INTRAVENOUS

## 2013-01-01 MED ORDER — ONDANSETRON HCL 4 MG/2ML IJ SOLN
INTRAMUSCULAR | Status: DC | PRN
  Administered 2013-01-01: 4 mg via INTRAMUSCULAR

## 2013-01-01 NOTE — H&P (Signed)
[facility] History and Physical  Nickson Middlesworth ZOX:[id number] DOB: [birth year] DOA: 01/01/2013  Referring physician: ED PCP: Warrick Parisian, MD  Chief Complaint: UGIB  HPI: Phillip Maldonado is a 62 y.o. male who presents to the ED after an episode of vomiting BRB.  50cc in the ambulence followed by another episode of BRB vomiting in the ED and a single large melanotic BM in the ED.  Symptoms onset earlier this afternoon with stomach "gurgling".  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  Due to tachycardia, hematemesis he was taken urgently to the OR for EGD.  Review of Systems: 12 systems reviewed and otherwise negative.  Past Medical History  Diagnosis Date  . CHF (congestive heart failure)   . Hypertension   . Anemia   . Sleep apnea, obstructive     does where bipap  . Angina   . Shortness of breath   . Anxiety   . Blood transfusion   . Headache(784.0)   . Pneumonia   . Depression   . Hepatitis Hep B  . Neuromuscular disorder DJD  . Arthritis   . Diabetes mellitus 09/18/2011   Past Surgical History  Procedure Laterality Date  . Back surgery    . Colon surgery      Gastric [phone]; Reversed in 1992 and revision 1994  . Diagnostic laparoscopy     Social History:  reports that he has never smoked. He does not have any smokeless tobacco history on file. He reports that he does not drink alcohol or use illicit drugs.   Allergies  Allergen Reactions  . Adhesive [Tape] Other (See Comments)    unknown  . Feldene [Piroxicam] Other (See Comments)    unknown  . Latex     Rash and itching  . Morphine And Related Other (See Comments)    unknown    Family History  Problem Relation Age of Onset  . Heart disease Father   . Skin cancer Father     Prior to Admission medications   Medication Sig Start Date End Date Taking? Authorizing Provider  amLODipine (NORVASC) 10 MG tablet Take 10 mg by mouth daily.   Yes  Historical Provider, MD  aspirin 325 MG EC tablet Take 325 mg by mouth daily.   Yes Historical Provider, MD  atorvastatin (LIPITOR) 10 MG tablet Take 10 mg by mouth daily.   Yes Historical Provider, MD  desvenlafaxine (PRISTIQ) 50 MG 24 hr tablet Take 50 mg by mouth daily.   Yes Historical Provider, MD  furosemide (LASIX) 40 MG tablet Take by mouth 2 (two) times daily. 1 1/2 tabs   Yes Historical Provider, MD  gabapentin (NEURONTIN) 800 MG tablet Take 800-1,600 mg by mouth 3 (three) times daily. 1600mg  in the morning, 800mg  in the afternoon, and 1600mg  at night.   Yes Historical Provider, MD  HYDROmorphone (DILAUDID) 2 MG tablet Take 6 mg by mouth 3 (three) times daily.   Yes Historical Provider, MD  losartan (COZAAR) 100 MG tablet Take 100 mg by mouth daily.   Yes Historical Provider, MD  methadone (DOLOPHINE) 10 MG tablet Take 10 mg by mouth 3 (three) times daily.    Yes Historical Provider, MD  metolazone (ZAROXOLYN) 5 MG tablet Take 5 mg by mouth daily.   Yes Historical Provider, MD  metoprolol (LOPRESSOR) 50 MG tablet Take 25 mg by mouth 2 (two) times daily.   Yes Historical Provider, MD  Multiple Vitamin [facility]  WITH MINERALS) TABS Take 1 tablet by mouth daily.   Yes Historical Provider, MD  nitroGLYCERIN (NITROSTAT) 0.4 MG SL tablet Place 0.4 mg under the tongue every 5 (five) minutes as needed. For chest pain   Yes Historical Provider, MD  polycarbophil (FIBERCON) 625 MG tablet Take 625 mg by mouth daily.   Yes Historical Provider, MD  polyvinyl alcohol (LIQUIFILM TEARS) 1.4 % ophthalmic solution Place 1 drop into both eyes daily as needed. For dry eyes   Yes Historical Provider, MD  Testosterone (ANDROGEL PUMP) 1.25 GM/ACT (1%) GEL Place 1 application onto the skin daily.   Yes Historical Provider, MD  traZODone (DESYREL) 100 MG tablet Take 300 mg by mouth at bedtime.    Yes Historical Provider, MD  venlafaxine (EFFEXOR) 75 MG tablet Take 75 mg by mouth 2 (two) times daily.   Yes  Historical Provider, MD  potassium chloride SA (K-DUR,KLOR-CON) 20 MEQ tablet Take 1 tablet (20 mEq total) by mouth daily. 05/10/11 05/09/12  Joseph Art, DO   Physical Exam: Filed Vitals:   01/01/13 2326  BP: 144/96  Pulse:   Temp: 97.7 F (36.5 C)  Resp: 17     General:  Moderate distress in ED, rapid breathing, pale appearing  Eyes: PEERLA EOMI  ENT: mucous membranes moist  Neck: supple w/o JVD  Cardiovascular: RRR w/o MRG  Respiratory: CTA B  Abdomen: soft, nt, nd, bs+  Skin: no rash nor lesion  Musculoskeletal: MAE, full ROM all 4 extremities  Psychiatric: normal tone and affect  Neurologic: AAOx3, grossly non-focal  Labs on Admission:  Basic Metabolic Panel:  Recent Labs Lab 01/01/13 1732 01/01/13 1744 01/01/13 2056  NA 135 139 140  K 4.7 4.2 5.1  CL 99 105 107  CO2 20  --   --   GLUCOSE 263* 252* 270*  BUN 31* 33* 40*  CREATININE 0.83 1.20 1.00  CALCIUM 8.9  --   --    Liver Function Tests:  Recent Labs Lab 01/01/13 1732  AST 34  ALT 30  ALKPHOS 54  BILITOT 0.4  PROT 6.2  ALBUMIN 2.9*   No results found for this basename: LIPASE, AMYLASE,  in the last 168 hours No results found for this basename: AMMONIA,  in the last 168 hours CBC:  Recent Labs Lab 01/01/13 1744 01/01/13 1830 01/01/13 2056  WBC  --  16.9*  --   NEUTROABS  --  13.7*  --   HGB 10.9* 10.8* 11.2*  HCT 32.0* 32.9* 33.0*  MCV  --  92.9  --   PLT  --  264  --    Cardiac Enzymes: No results found for this basename: CKTOTAL, CKMB, CKMBINDEX, TROPONINI,  in the last 168 hours  BNP (last 3 results) No results found for this basename: PROBNP,  in the last 8760 hours CBG: No results found for this basename: GLUCAP,  in the last 168 hours  Radiological Exams on Admission: Dg Chest Port 1 View  01/01/2013   CLINICAL DATA:  Chest pain  EXAM: PORTABLE CHEST - 1 VIEW  COMPARISON:  05/08/2011  FINDINGS: Low lung volumes with bibasilar atelectasis. Upper normal heart  size. Upper lungs clear. No pneumothorax.  IMPRESSION: Bibasilar atelectasis.   Electronically Signed   By: Maryclare Bean M.D.   On: 01/01/2013 18:29    EKG: Independently reviewed.  Assessment/Plan Principal Problem:   UGIB (upper gastrointestinal bleed)   1. UGIB - endoscopy showed a bunch of blood in stomach, no active  bleeding source identified, keeping patient NPO for now (may advance to liquids), on PPI GTT, and plan to repeat EGD in next couple of days.  Admitting to SDU due to tachycardia, repeat CBC in AM, on IVF at 125 cc/hr transfuse if indicated. 2. DM - putting patient on SSI q4h   Code Status: Full Code (must indicate code status--if unknown or must be presumed, indicate so) Family Communication: No family in room (indicate person spoken with, if applicable, with phone number if by telephone) Disposition Plan: Admit to SDU (indicate anticipated LOS)  Time spent: 70 min  GARDNER, JARED M. [facility] Pager [phone]  If 7PM-7AM, please contact night-coverage [email or web address] Password [email or web address] 01/01/2013, 11:43 PM

## 2013-01-01 NOTE — Anesthesia Preprocedure Evaluation (Addendum)
Anesthesia Evaluation  Patient identified by MRN, date of birth, ID band Patient awake    Reviewed: Allergy & Precautions, H&P , NPO status , Patient's Chart, lab work & pertinent test results  Airway Mallampati: II TM Distance: >3 FB Neck ROM: Full    Dental   Pulmonary shortness of breath, sleep apnea ,    + decreased breath sounds      Cardiovascular Rhythm:Regular Rate:Normal     Neuro/Psych  Headaches, Anxiety Depression  Neuromuscular disease    GI/Hepatic (+) Hepatitis -UGI bleeding   Endo/Other  diabetesMorbid obesity  Renal/GU      Musculoskeletal   Abdominal (+) + obese,   Peds  Hematology   Anesthesia Other Findings   Reproductive/Obstetrics                           Anesthesia Physical Anesthesia Plan  ASA: III and emergent  Anesthesia Plan: General   Post-op Pain Management:    Induction: Intravenous  Airway Management Planned: Oral ETT  Additional Equipment:   Intra-op Plan:   Post-operative Plan: Extubation in OR  Informed Consent: I have reviewed the patients History and Physical, chart, labs and discussed the procedure including the risks, benefits and alternatives for the proposed anesthesia with the patient or authorized representative who has indicated his/her understanding and acceptance.     Plan Discussed with: CRNA and Surgeon  Anesthesia Plan Comments:         Anesthesia Quick Evaluation

## 2013-01-01 NOTE — Preoperative (Signed)
Beta Blockers   Reason not to administer Beta Blockers:Not Applicable, Metoprolol 50 mg po@0900hrs . on 01/01/2013

## 2013-01-01 NOTE — ED Notes (Signed)
PT reports sever constipation and felt nauseated today. Pt vomited bright red blood on arrival to ED. Pt A/O on arrival.

## 2013-01-01 NOTE — ED Notes (Signed)
Patient transferred to [facility] OR by this RN. Report given to CRNA.

## 2013-01-01 NOTE — ED Provider Notes (Addendum)
CSN: [id number]     Arrival date & time 01/01/13  1718 History   First MD Initiated Contact with Patient 01/01/13 1719     Chief Complaint  Patient presents with  . GI Bleeding  . Abdominal Pain   (Consider location/radiation/quality/duration/timing/severity/associated sxs/prior Treatment) HPI Comments: Patient presents with hematemesis. He states that he was having some nausea and some upper abdominal discomfort  that started about an hour prior to arrival to the emergency department. He felt like he had to vomit but wasn't able to do anything. He started having some discomfort up into his chest and called EMS. When EMS got there he vomited frank blood. He's only had one episode of hematemesis. It was estimated to be about 50 cc of bright red blood. He has not had any diarrhea or bloody stools. He denies any melena. He denies a history of GI bleeds in the past. He did have a complication from gastric bypass surgery in 1970s where he had a staple come loose and he had bleeding at that point. He was given a blood transfusion at that point. He denies any other episodes of GI bleeding. He feels fatigued all over but denies any dizziness. He currently denies any chest pain or shortness of breath. He denies any aspirin or NSAID use other than he takes 1 325 mg aspirin per day.  Patient is a 62 y.o. male presenting with abdominal pain.  Abdominal Pain Associated symptoms: fatigue, nausea and vomiting   Associated symptoms: no chest pain, no chills, no cough, no diarrhea, no fever, no hematuria and no shortness of breath     Past Medical History  Diagnosis Date  . CHF (congestive heart failure)   . Hypertension   . Anemia   . Sleep apnea, obstructive     does where bipap  . Angina   . Shortness of breath   . Anxiety   . Blood transfusion   . Headache(784.0)   . Pneumonia   . Depression   . Hepatitis Hep B  . Neuromuscular disorder DJD  . Arthritis   . Diabetes mellitus 09/18/2011   Past  Surgical History  Procedure Laterality Date  . Back surgery    . Colon surgery      Gastric [phone]; Reversed in 1992 and revision 1994  . Diagnostic laparoscopy     Family History  Problem Relation Age of Onset  . Heart disease Father   . Skin cancer Father    History  Substance Use Topics  . Smoking status: Never Smoker   . Smokeless tobacco: Not on file  . Alcohol Use: No    Review of Systems  Constitutional: Positive for fatigue. Negative for fever, chills and diaphoresis.  HENT: Negative for congestion, rhinorrhea and sneezing.   Eyes: Negative.   Respiratory: Negative for cough, chest tightness and shortness of breath.   Cardiovascular: Negative for chest pain and leg swelling.  Gastrointestinal: Positive for nausea, vomiting and abdominal pain. Negative for diarrhea and blood in stool.  Genitourinary: Negative for frequency, hematuria, flank pain and difficulty urinating.  Musculoskeletal: Negative for arthralgias and back pain.  Skin: Negative for rash.  Neurological: Negative for dizziness, speech difficulty, weakness, numbness and headaches.    Allergies  Adhesive; Feldene; Latex; and Morphine and related  Home Medications   Current Outpatient Rx  Name  Route  Sig  Dispense  Refill  . amLODipine (NORVASC) 10 MG tablet   Oral   Take 10 mg by mouth daily.         Marland Kitchen  aspirin 325 MG EC tablet   Oral   Take 325 mg by mouth daily.         Marland Kitchen atorvastatin (LIPITOR) 10 MG tablet   Oral   Take 10 mg by mouth daily.         Marland Kitchen desvenlafaxine (PRISTIQ) 50 MG 24 hr tablet   Oral   Take 50 mg by mouth daily.         . furosemide (LASIX) 40 MG tablet   Oral   Take by mouth 2 (two) times daily. 1 1/2 tabs         . gabapentin (NEURONTIN) 800 MG tablet   Oral   Take 800-1,600 mg by mouth 3 (three) times daily. 1600mg  in the morning, 800mg  in the afternoon, and 1600mg  at night.         Marland Kitchen HYDROmorphone (DILAUDID) 2 MG tablet   Oral   Take 6 mg by  mouth 3 (three) times daily.         Marland Kitchen losartan (COZAAR) 100 MG tablet   Oral   Take 100 mg by mouth daily.         . methadone (DOLOPHINE) 10 MG tablet   Oral   Take 10 mg by mouth 3 (three) times daily.          . metolazone (ZAROXOLYN) 5 MG tablet   Oral   Take 5 mg by mouth daily.         . metoprolol (LOPRESSOR) 50 MG tablet   Oral   Take 25 mg by mouth 2 (two) times daily.         . Multiple Vitamin (MULITIVITAMIN WITH MINERALS) TABS   Oral   Take 1 tablet by mouth daily.         . nitroGLYCERIN (NITROSTAT) 0.4 MG SL tablet   Sublingual   Place 0.4 mg under the tongue every 5 (five) minutes as needed. For chest pain         . polycarbophil (FIBERCON) 625 MG tablet   Oral   Take 625 mg by mouth daily.         . polyvinyl alcohol (LIQUIFILM TEARS) 1.4 % ophthalmic solution   Both Eyes   Place 1 drop into both eyes daily as needed. For dry eyes         . Testosterone (ANDROGEL PUMP) 1.25 GM/ACT (1%) GEL   Transdermal   Place 1 application onto the skin daily.         . traZODone (DESYREL) 100 MG tablet   Oral   Take 300 mg by mouth at bedtime.          Marland Kitchen venlafaxine (EFFEXOR) 75 MG tablet   Oral   Take 75 mg by mouth 2 (two) times daily.         Marland Kitchen EXPIRED: potassium chloride SA (K-DUR,KLOR-CON) 20 MEQ tablet   Oral   Take 1 tablet (20 mEq total) by mouth daily.   30 tablet   0    BP 133/80  Pulse 100  Temp(Src) 98.4 F (36.9 C) (Oral)  Resp 29  SpO2 92% Physical Exam  Constitutional: He is oriented to person, place, and time. He appears well-developed and well-nourished.  Morbidly obese  HENT:  Head: Normocephalic and atraumatic.  Eyes: Pupils are equal, round, and reactive to light.  Neck: Normal range of motion. Neck supple.  Cardiovascular: Normal rate, regular rhythm and normal heart sounds.   Pulmonary/Chest: Effort normal and breath sounds normal.  No respiratory distress. He has no wheezes. He has no rales. He  exhibits no tenderness.  Abdominal: Soft. Bowel sounds are normal. There is tenderness (Mild tenderness across the upper abdomen). There is no rebound and no guarding.  Musculoskeletal: Normal range of motion. He exhibits no edema.  Lymphadenopathy:    He has no cervical adenopathy.  Neurological: He is alert and oriented to person, place, and time.  Skin: Skin is warm and dry. No rash noted.  Psychiatric: He has a normal mood and affect.    ED Course  Procedures (including critical care time) Labs Review Labs Reviewed  COMPREHENSIVE METABOLIC PANEL - Abnormal; Notable for the following:    Glucose, Bld 263 (*)    BUN 31 (*)    Albumin 2.9 (*)    All other components within normal limits  CBC WITH DIFFERENTIAL - Abnormal; Notable for the following:    WBC 16.9 (*)    RBC 3.54 (*)    Hemoglobin 10.8 (*)    HCT 32.9 (*)    Neutrophils Relative % 81 (*)    Neutro Abs 13.7 (*)    Monocytes Absolute 1.1 (*)    All other components within normal limits  POCT I-STAT, CHEM 8 - Abnormal; Notable for the following:    BUN 33 (*)    Glucose, Bld 252 (*)    Calcium, Ion 1.11 (*)    Hemoglobin 10.9 (*)    HCT 32.0 (*)    All other components within normal limits  OCCULT BLOOD, POC DEVICE - Abnormal; Notable for the following:    Fecal Occult Bld POSITIVE (*)    All other components within normal limits  CBC WITH DIFFERENTIAL  TYPE AND SCREEN  ABO/RH   Imaging Review Dg Chest Port 1 View  01/01/2013   CLINICAL DATA:  Chest pain  EXAM: PORTABLE CHEST - 1 VIEW  COMPARISON:  05/08/2011  FINDINGS: Low lung volumes with bibasilar atelectasis. Upper normal heart size. Upper lungs clear. No pneumothorax.  IMPRESSION: Bibasilar atelectasis.   Electronically Signed   By: Maryclare Bean M.D.   On: 01/01/2013 18:29    EKG Interpretation   None       MDM   1. GI bleed     Date: 01/01/2013  Rate: 106  Rhythm: sinus tachycardia  QRS Axis: left  Intervals: normal  ST/T Wave  abnormalities: nonspecific ST/T changes  Conduction Disutrbances:none  Narrative Interpretation:   Old EKG Reviewed: unchanged    18:52.  Pt had one episode of hematemisis.  Has remained tachycardic.  BP okay, appears pale, but hbg 10.9 currently.  Last value was 14 one year ago.  Pt just had very large maroon/black stool.  Will consult GI  I discussed the pt with [facility] physician on call, Dr. Ewing Schlein my concerns about pt with active bleeding.  He has had no further stools or hematemesis, but we have been unable to pass an NGT. Dr. Ewing Schlein who advised to keep pt NPO and they will plan on doing scope in the AM.  Will admit to hospitalist. Will check another hgb  21:12: Dr. Ewing Schlein requests assistance in managing pt's airway during endoscopy due to his morbid obesity.  I contacted anesthesia and feel that pt would better off having this in OR with anesthesia managing airway.  I contacted anesthesia who is aware.  Dr. Ewing Schlein to call in endo team for endoscopy in OR.  CRITICAL CARE Performed by: Shamell Suarez Total critical care time: 46  Critical care time was exclusive of separately billable procedures and treating other patients. Critical care was necessary to treat or prevent imminent or life-threatening deterioration. Critical care was time spent personally by me on the following activities: development of treatment plan with patient and/or surrogate as well as nursing, discussions with consultants, evaluation of patient's response to treatment, examination of patient, obtaining history from patient or surrogate, ordering and performing treatments and interventions, ordering and review of laboratory studies, ordering and review of radiographic studies, pulse oximetry and re-evaluation of patient's condition.     Rolan Bucco, MD 01/01/13 1610  Rolan Bucco, MD 01/01/13 9604  Rolan Bucco, MD 01/01/13 2230

## 2013-01-01 NOTE — Transfer of Care (Signed)
Immediate Anesthesia Transfer of Care Note  Patient: Phillip Maldonado  Procedure(s) Performed: Procedure(s): ESOPHAGOSCOPY (N/A)  Patient Location: PACU  Anesthesia Type:General  Level of Consciousness: awake, oriented, sedated, patient cooperative and responds to stimulation  Airway & Oxygen Therapy: Patient Spontanous Breathing and Patient connected to nasal cannula oxygen  Post-op Assessment: Report given to PACU RN, Post -op Vital signs reviewed and stable, Patient moving all extremities and Patient moving all extremities X 4  Post vital signs: Reviewed and stable  Complications: No apparent anesthesia complications

## 2013-01-01 NOTE — ED Notes (Signed)
Pt from home c/o ABD pain starting around 4 pm. Pt vomited enroute 50cc bright red fluid. Received 4 zofran IM by EMS. Pt states nausea and light headedness at this time.

## 2013-01-01 NOTE — Anesthesia Postprocedure Evaluation (Signed)
  Anesthesia Post-op Note  Patient: Phillip Maldonado  Procedure(s) Performed: Procedure(s): ESOPHAGOSCOPY (N/A)  Patient Location: PACU  Anesthesia Type:General  Level of Consciousness: awake  Airway and Oxygen Therapy: Patient Spontanous Breathing  Post-op Pain: none  Post-op Assessment: Post-op Vital signs reviewed, Patient's Cardiovascular Status Stable, Respiratory Function Stable, Patent Airway, No signs of Nausea or vomiting and Pain level controlled  Post-op Vital Signs: Reviewed and stable  Complications: No apparent anesthesia complications

## 2013-01-01 NOTE — Anesthesia Procedure Notes (Signed)
Procedure Name: Intubation Date/Time: 01/01/2013 10:37 PM Performed by: Wray Kearns A Pre-anesthesia Checklist: Patient identified, Timeout performed, Emergency Drugs available, Suction available and Patient being monitored Patient Re-evaluated:Patient Re-evaluated prior to inductionOxygen Delivery Method: Circle system utilized Preoxygenation: Pre-oxygenation with 100% oxygen Intubation Type: IV induction, Rapid sequence and Cricoid Pressure applied Ventilation: Oral airway inserted - appropriate to patient size and Two handed mask ventilation required Laryngoscope Size: Mac Grade View: Grade I Tube type: Oral Tube size: 8.5 mm Number of attempts: 1 Airway Equipment and Method: Stylet and Video-laryngoscopy Placement Confirmation: ETT inserted through vocal cords under direct vision and breath sounds checked- equal and bilateral Secured at: 23 cm Tube secured with: Tape Dental Injury: Teeth and Oropharynx as per pre-operative assessment

## 2013-01-01 NOTE — Consult Note (Signed)
Reason for Consult: Upper GI bleeding Referring Physician: ER and hospital team  Phillip Maldonado is an 62 y.o. male.  HPI: Patient with a few days of constipation and then had a large black bowel movement and threw up some bright red blood and has had some abdominal pain and increase shortness of breath and has not had any GI workup in years and no obvious problems since his last gastric bypass surgery over 20 years ago however he's been on an aspirin a day but no other blood thinners or nonsteroidals and he takes Tylenol for over-the-counter pain and other than constipation no GI problems run in the family and he has not had a colonoscopy in years and he denies any upper tract symptoms as well and lately he's been gaining weight and has no other complaints other than chronic back pain  Past Medical History  Diagnosis Date  . CHF (congestive heart failure)   . Hypertension   . Anemia   . Sleep apnea, obstructive     does where bipap  . Angina   . Shortness of breath   . Anxiety   . Blood transfusion   . Headache(784.0)   . Pneumonia   . Depression   . Hepatitis Hep B  . Neuromuscular disorder DJD  . Arthritis   . Diabetes mellitus 09/18/2011    Past Surgical History  Procedure Laterality Date  . Back surgery    . Colon surgery      Gastric [phone]; Reversed in 1992 and revision 1994  . Diagnostic laparoscopy      Family History  Problem Relation Age of Onset  . Heart disease Father   . Skin cancer Father     Social History:  reports that he has never smoked. He does not have any smokeless tobacco history on file. He reports that he does not drink alcohol or use illicit drugs.  Allergies:  Allergies  Allergen Reactions  . Adhesive [Tape] Other (See Comments)    unknown  . Feldene [Piroxicam] Other (See Comments)    unknown  . Latex     Rash and itching  . Morphine And Related Other (See Comments)    unknown    Medications: I have reviewed the patient's current  medications.  Results for orders placed during the hospital encounter of 01/01/13 (from the past 48 hour(s))  TYPE AND SCREEN     Status: None   Collection Time    01/01/13  5:25 PM      Result Value Range   ABO/RH(D) O POS     Antibody Screen NEG     Sample Expiration 01/04/2013    ABO/RH     Status: None   Collection Time    01/01/13  5:25 PM      Result Value Range   ABO/RH(D) O POS    COMPREHENSIVE METABOLIC PANEL     Status: Abnormal   Collection Time    01/01/13  5:32 PM      Result Value Range   Sodium 135  135 - 145 mEq/L   Potassium 4.7  3.5 - 5.1 mEq/L   Chloride 99  96 - 112 mEq/L   CO2 20  19 - 32 mEq/L   Glucose, Bld 263 (*) 70 - 99 mg/dL   BUN 31 (*) 6 - 23 mg/dL   Creatinine, Ser 0.98  0.50 - 1.35 mg/dL   Calcium 8.9  8.4 - 11.9 mg/dL   Total Protein 6.2  6.0 - 8.3  g/dL   Albumin 2.9 (*) 3.5 - 5.2 g/dL   AST 34  0 - 37 U/L   ALT 30  0 - 53 U/L   Alkaline Phosphatase 54  39 - 117 U/L   Total Bilirubin 0.4  0.3 - 1.2 mg/dL   GFR calc non Af Amer >90  >90 mL/min   GFR calc Af Amer >90  >90 mL/min   Comment: (NOTE)     The eGFR has been calculated using the CKD EPI equation.     This calculation has not been validated in all clinical situations.     eGFR's persistently <90 mL/min signify possible Chronic Kidney     Disease.  POCT I-STAT, CHEM 8     Status: Abnormal   Collection Time    01/01/13  5:44 PM      Result Value Range   Sodium 139  135 - 145 mEq/L   Potassium 4.2  3.5 - 5.1 mEq/L   Chloride 105  96 - 112 mEq/L   BUN 33 (*) 6 - 23 mg/dL   Creatinine, Ser 4.09  0.50 - 1.35 mg/dL   Glucose, Bld 811 (*) 70 - 99 mg/dL   Calcium, Ion 9.14 (*) 1.13 - 1.30 mmol/L   TCO2 21  0 - 100 mmol/L   Hemoglobin 10.9 (*) 13.0 - 17.0 g/dL   HCT 78.2 (*) 95.6 - 21.3 %  CBC WITH DIFFERENTIAL     Status: Abnormal   Collection Time    01/01/13  6:30 PM      Result Value Range   WBC 16.9 (*) 4.0 - 10.5 K/uL   RBC 3.54 (*) 4.22 - 5.81 MIL/uL   Hemoglobin 10.8 (*)  13.0 - 17.0 g/dL   HCT 08.6 (*) 57.8 - 46.9 %   MCV 92.9  78.0 - 100.0 fL   MCH 30.5  26.0 - 34.0 pg   MCHC 32.8  30.0 - 36.0 g/dL   RDW 62.9  52.8 - 41.3 %   Platelets 264  150 - 400 K/uL   Neutrophils Relative % 81 (*) 43 - 77 %   Neutro Abs 13.7 (*) 1.7 - 7.7 K/uL   Lymphocytes Relative 12  12 - 46 %   Lymphs Abs 2.0  0.7 - 4.0 K/uL   Monocytes Relative 6  3 - 12 %   Monocytes Absolute 1.1 (*) 0.1 - 1.0 K/uL   Eosinophils Relative 1  0 - 5 %   Eosinophils Absolute 0.1  0.0 - 0.7 K/uL   Basophils Relative 0  0 - 1 %   Basophils Absolute 0.0  0.0 - 0.1 K/uL  OCCULT BLOOD, POC DEVICE     Status: Abnormal   Collection Time    01/01/13  6:57 PM      Result Value Range   Fecal Occult Bld POSITIVE (*) NEGATIVE  POCT I-STAT, CHEM 8     Status: Abnormal   Collection Time    01/01/13  8:56 PM      Result Value Range   Sodium 140  135 - 145 mEq/L   Potassium 5.1  3.5 - 5.1 mEq/L   Chloride 107  96 - 112 mEq/L   BUN 40 (*) 6 - 23 mg/dL   Creatinine, Ser 2.44  0.50 - 1.35 mg/dL   Glucose, Bld 010 (*) 70 - 99 mg/dL   Calcium, Ion 2.72 (*) 1.13 - 1.30 mmol/L   TCO2 21  0 - 100 mmol/L   Hemoglobin 11.2 (*)  13.0 - 17.0 g/dL   HCT 45.4 (*) 09.8 - 11.9 %    Dg Chest Port 1 View  01/01/2013   CLINICAL DATA:  Chest pain  EXAM: PORTABLE CHEST - 1 VIEW  COMPARISON:  05/08/2011  FINDINGS: Low lung volumes with bibasilar atelectasis. Upper normal heart size. Upper lungs clear. No pneumothorax.  IMPRESSION: Bibasilar atelectasis.   Electronically Signed   By: Maryclare Bean M.D.   On: 01/01/2013 18:29    ROS negative except above Blood pressure 178/145, pulse 123, temperature 98.4 F (36.9 C), temperature source Oral, resp. rate 22, SpO2 99.00%. Physical Exam unsure of blood pressure is correct as written above but is slightly tachycardic mildly short of breath alert and oriented x3 decreased breath sounds bilaterally decreased heart sounds abdomen is soft nontender labs  reviewed Assessment/Plan: Upper GI bleeding in a patient on an aspirin a day and history of gastric bypass surgery x3 and multiple medical problem Plan: The risks benefits methods of endoscopy was discussed with the patient and will proceed ASAP with further workup and plans pending those findings  Swathi Dauphin E 01/01/2013, 9:50 PM

## 2013-01-01 NOTE — Op Note (Signed)
[facility] [address] [place], [address]   ENDOSCOPY PROCEDURE REPORT  PATIENT: Phillip Maldonado, Phillip Maldonado  MR#: [id number] BIRTHDATE: [birth year] , 61  yrs. old GENDER: Male  ENDOSCOPIST: Vida Rigger, MD REFERRED BY:  PROCEDURE DATE:  01/01/2013 PROCEDURE:   EGD, diagnostic ASA CLASS:   Class III INDICATIONS:Hematemesis.  MEDICATIONS: General endotracheal anesthesia (GETA)  TOPICAL ANESTHETIC:none   DESCRIPTION OF PROCEDURE:   After the risks benefits and alternatives of the procedure were thoroughly explained, informed consent was obtained.  The Pentax Gastroscope [id number]  endoscope was introduced through the mouth and advanced to the proximal jejunum , limited by Without limitations. his anatomy was deformed from previous surgery and identification of all areas was difficult due to old clots and food in the gastric remnant  The instrument was slowly withdrawn as the mucosa was fully examined.we did not see the antrum or pylorus due to either surgical changes or increased food and there was fairly fresh blood in his small bowel anastomosis which had 2 limbs and we could not see an obvious source despite a prolonged look and the procedure was done on his back while intubated due to his size and at one point we did raise the head of his bed at 1 point to decrease reflux and help eliminate the esophageal fluid and after our prolonged effort with lots of washing and suctioning we elected to stop the procedure and the patient tolerated the procedure well there was no obvious immediate complication          FINDINGS:1. Normal esophagus 2 multiple gastric bypass surgeries and deformed anatomy with 2 small bowel limbs in proximal stomach both with fresher blood but no obvious bleeding source 3 gastric remnant with increased clots and food but no active bleedingCOMPLICATIONS:none  ENDOSCOPIC IMPRESSION:above   RECOMMENDATIONS:sips of clear  liquids only treated medically for now with holding aspirin and using IV Protonix and repeat endoscopy when necessary or in one to 2 days to better determine anatomy and possible bleeding source   REPEAT EXAM: one to 2 days   _______________________________ Vida Rigger, MD eSigned:  Vida Rigger, MD 01/01/2013 11:29 PM    CC:  PATIENT NAME:  Brallan, Denio MR#: [id number]

## 2013-01-01 NOTE — ED Notes (Signed)
Patient's sister called after receiving message from EDP. Referred sister to OR contact person.

## 2013-01-01 NOTE — Progress Notes (Signed)
Addendum to endoscopy patient's blood sugar was increased so he'll need the medicine team to address that and please evaluate his list of medicines as an out patient and decide which ones he needs but please hold aspirin for now and will allow sips of clear liquids only and IV Protonix and repeat endoscopy in a day or 2

## 2013-01-02 DIAGNOSIS — Z9884 Bariatric surgery status: Secondary | ICD-10-CM

## 2013-01-02 DIAGNOSIS — D62 Acute posthemorrhagic anemia: Secondary | ICD-10-CM | POA: Diagnosis present

## 2013-01-02 LAB — CBC WITH DIFFERENTIAL/PLATELET
Basophils Absolute: 0 10*3/uL (ref 0.0–0.1)
Eosinophils Absolute: 0 10*3/uL (ref 0.0–0.7)
Eosinophils Relative: 0 % (ref 0–5)
Lymphocytes Relative: 14 % (ref 12–46)
Lymphs Abs: 1.4 10*3/uL (ref 0.7–4.0)
MCH: 30.6 pg (ref 26.0–34.0)
MCV: 93.3 fL (ref 78.0–100.0)
Monocytes Relative: 10 % (ref 3–12)
Neutro Abs: 7.7 10*3/uL (ref 1.7–7.7)
Neutrophils Relative %: 76 % (ref 43–77)
Platelets: 217 10*3/uL (ref 150–400)
RDW: 14.6 % (ref 11.5–15.5)
WBC: 10.1 10*3/uL (ref 4.0–10.5)

## 2013-01-02 LAB — GLUCOSE, CAPILLARY
Glucose-Capillary: 220 mg/dL — ABNORMAL HIGH (ref 70–99)
Glucose-Capillary: 192 mg/dL — ABNORMAL HIGH (ref 70–99)
Glucose-Capillary: 207 mg/dL — ABNORMAL HIGH (ref 70–99)

## 2013-01-02 LAB — BASIC METABOLIC PANEL
BUN: 40 mg/dL — ABNORMAL HIGH (ref 6–23)
Chloride: 105 mEq/L (ref 96–112)
GFR calc Af Amer: >90 mL/min (ref >90)
GFR calc non Af Amer: 89 mL/min — ABNORMAL LOW (ref >90)
Potassium: 4.5 mEq/L (ref 3.5–5.1)

## 2013-01-02 LAB — HEMOGLOBIN A1C: Mean Plasma Glucose: 197 mg/dL — ABNORMAL HIGH (ref <117)

## 2013-01-02 LAB — MRSA PCR SCREENING: MRSA by PCR: NEGATIVE

## 2013-01-02 MED ORDER — AMLODIPINE BESYLATE 10 MG PO TABS
10.0000 mg | ORAL_TABLET | Freq: Every day | ORAL | Status: DC
  Administered 2013-01-02 – 2013-01-05 (×4): 10 mg via ORAL
  Filled 2013-01-02 (×4): qty 1

## 2013-01-02 MED ORDER — TRAZODONE HCL 150 MG PO TABS
300.0000 mg | ORAL_TABLET | Freq: Every day | ORAL | Status: DC
  Administered 2013-01-02 – 2013-01-04 (×4): 300 mg via ORAL
  Filled 2013-01-02 (×5): qty 2

## 2013-01-02 MED ORDER — GABAPENTIN 400 MG PO CAPS
800.0000 mg | ORAL_CAPSULE | ORAL | Status: DC
  Administered 2013-01-02 – 2013-01-05 (×3): 800 mg via ORAL
  Filled 2013-01-02 (×4): qty 2

## 2013-01-02 MED ORDER — VENLAFAXINE HCL 75 MG PO TABS
75.0000 mg | ORAL_TABLET | Freq: Two times a day (BID) | ORAL | Status: DC
  Administered 2013-01-02 – 2013-01-05 (×6): 75 mg via ORAL
  Filled 2013-01-02 (×9): qty 1

## 2013-01-02 MED ORDER — METHADONE HCL 10 MG PO TABS
10.0000 mg | ORAL_TABLET | Freq: Once | ORAL | Status: AC
  Administered 2013-01-02: 10 mg via ORAL
  Filled 2013-01-02: qty 1

## 2013-01-02 MED ORDER — MIDAZOLAM HCL 2 MG/2ML IJ SOLN: 1.0000 mg | INTRAMUSCULAR | Status: DC | PRN

## 2013-01-02 MED ORDER — INSULIN ASPART 100 UNIT/ML ~~LOC~~ SOLN
SUBCUTANEOUS | Status: AC
  Administered 2013-01-02: 8 [IU] via SUBCUTANEOUS
  Filled 2013-01-02: qty 8

## 2013-01-02 MED ORDER — LORAZEPAM 1 MG PO TABS
1.0000 mg | ORAL_TABLET | Freq: Once | ORAL | Status: AC
  Administered 2013-01-02: 1 mg via ORAL
  Filled 2013-01-02: qty 1

## 2013-01-02 MED ORDER — METOLAZONE 5 MG PO TABS
5.0000 mg | ORAL_TABLET | Freq: Every day | ORAL | Status: DC
  Administered 2013-01-02 – 2013-01-05 (×4): 5 mg via ORAL
  Filled 2013-01-02 (×4): qty 1

## 2013-01-02 MED ORDER — GABAPENTIN 400 MG PO CAPS
1600.0000 mg | ORAL_CAPSULE | Freq: Two times a day (BID) | ORAL | Status: DC
  Administered 2013-01-02 – 2013-01-05 (×7): 1600 mg via ORAL
  Filled 2013-01-02 (×9): qty 4

## 2013-01-02 MED ORDER — FENTANYL CITRATE 0.05 MG/ML IJ SOLN: 50.0000 ug | Freq: Once | INTRAMUSCULAR | Status: DC

## 2013-01-02 MED ORDER — METHADONE HCL 10 MG PO TABS
10.0000 mg | ORAL_TABLET | Freq: Three times a day (TID) | ORAL | Status: DC
  Administered 2013-01-02 – 2013-01-05 (×9): 10 mg via ORAL
  Filled 2013-01-02 (×9): qty 1

## 2013-01-02 MED ORDER — GABAPENTIN 800 MG PO TABS: 800.0000 mg | ORAL_TABLET | Freq: Three times a day (TID) | ORAL | Status: DC

## 2013-01-02 MED ORDER — GABAPENTIN 400 MG PO CAPS
1600.0000 mg | ORAL_CAPSULE | Freq: Once | ORAL | Status: AC
  Administered 2013-01-02: 1600 mg via ORAL
  Filled 2013-01-02: qty 4

## 2013-01-02 MED ORDER — PANTOPRAZOLE SODIUM 40 MG IV SOLR: 40.0000 mg | Freq: Two times a day (BID) | INTRAVENOUS | Status: DC

## 2013-01-02 MED ORDER — METOPROLOL TARTRATE 25 MG PO TABS
25.0000 mg | ORAL_TABLET | Freq: Two times a day (BID) | ORAL | Status: DC
  Administered 2013-01-02 – 2013-01-05 (×8): 25 mg via ORAL
  Filled 2013-01-02 (×9): qty 1

## 2013-01-02 NOTE — Progress Notes (Signed)
Subjective: No further hematemesis.  Objective: Vital signs in last 24 hours: Temp:  [97.5 F (36.4 C)-99.4 F (37.4 C)] 98 F (36.7 C) (10/20 0800) Pulse Rate:  [100-125] 104 (10/20 0738) Resp:  [15-29] 24 (10/20 0738) BP: (109-164)/(52-136) 156/81 mmHg (10/20 0738) SpO2:  [92 %-100 %] 98 % (10/20 0738) FiO2 (%):  [3 %] 3 % (10/19 2326) Weight:  [181.439 kg (400 lb)-192.5 kg (424 lb 6.2 oz)] 192.5 kg (424 lb 6.2 oz) (10/20 0125) Weight change:  Last BM Date: 01/02/13  PE: GEN:  Obese, deconditioned with some tachypnea at rest (chronic per patient), chronically ill-appearing ABD:  Protuberant, bit firm but non tender, scant but present bowel sounds  Lab Results: CBC    Component Value Date/Time   WBC 10.1 01/02/2013 0428   RBC 3.27* 01/02/2013 0428   HGB 10.0* 01/02/2013 0428   HCT 30.5* 01/02/2013 0428   PLT 217 01/02/2013 0428   MCV 93.3 01/02/2013 0428   MCH 30.6 01/02/2013 0428   MCHC 32.8 01/02/2013 0428   RDW 14.6 01/02/2013 0428   LYMPHSABS 1.4 01/02/2013 0428   MONOABS 1.0 01/02/2013 0428   EOSABS 0.0 01/02/2013 0428   BASOSABS 0.0 01/02/2013 0428   Assessment:  1.  Hematemesis.  Expedited endoscopy last night just showed fresh blood in gastric pouch and efferent small bowel limbs.  Unable to identify source of bleeding.  Plan:  1.  PPI infusion. 2.  Clear liquids only, NPO after midnight. 3.  Repeat endoscopy, in OR, tomorrow sometime. 4.  Will follow.   Phillip Maldonado 01/02/2013, 8:57 AM

## 2013-01-02 NOTE — Progress Notes (Signed)
Utilization review completed.  

## 2013-01-02 NOTE — Progress Notes (Signed)
Inpatient Diabetes Program Recommendations  AACE/ADA: New Consensus Statement on Inpatient Glycemic Control (2013)  Target Ranges:  Prepandial:   less than 140 mg/dL      Peak postprandial:   less than 180 mg/dL (1-2 hours)      Critically ill patients:  140 - 180 mg/dL   Reason for Visit: Results for Phillip Maldonado, Phillip Maldonado (MRN [id number]) as of 01/02/2013 13:30  Ref. Range 01/01/2013 23:31 01/02/2013 03:48 01/02/2013 08:32  Glucose-Capillary Latest Range: 70-99 mg/dL 409 (H) 811 (H) 914 (H)   Note CBG's greater than goal.  May consider adding basal insulin such as Levemir 15 units daily while in the hospital.  Due to acute bleed, A1C would likely not be accurate.  Will follow.  Beryl Meager, RN, BC-ADM Inpatient Diabetes Coordinator Pager [phone]

## 2013-01-02 NOTE — Progress Notes (Signed)
[facility] Progress Note [place] TEAM 1 - Stepdown ICU Team   Phillip Maldonado MRN:[id number] DOB: 09/04/1950 DOA: 01/01/2013 PCP: STALLINGS,SHEILA, MD  Brief narrative: 62 y.o. male who presented to the ED after an episode of vomiting BRB - 50cc in the ambulance followed by another episode of BRB vomiting in the ED and a single large melanotic BM in the ED. Symptoms began earlier in the afternoon of admission with symptoms of stomach "gurgling". 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 Due to tachycardia and hematemesis he was taken urgently to the OR for EGD (complex EGD anticipated due to prior gastric bypass and body habitus).  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 given history OSA -BP stable and no active bleeding so will decrease IVF to 30/hr since GI allowing clears    Diabetes mellitus -not on meds pre admit -check HgbA1c    OSA (obstructive sleep apnea) -HS CPAP    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 No abdominal pain. No recurrence of  hematemesis.  Objective: Blood pressure 141/62, pulse 88, temperature 98.1 F (36.7 C), temperature source Oral, resp. rate 20, height 5' 10" (1.778 m), weight 192.5 kg (424 lb 6.2 oz), SpO2 98.00%.  Intake/Output Summary (Last 24 hours) at 01/02/13 1326 Last data filed at 01/02/13 1300  Gross per 24 hour  Intake   2710 ml  Output   1975 ml  Net    735 ml   Exam: General: No acute respiratory distress Lungs: Clear to auscultation bilaterally without wheezes or crackles, 3L Cardiovascular: Regular rate and rhythm without murmur gallop or rub normal S1 and S2, no peripheral edema or JVD Abdomen: Nontender, nondistended, soft, bowel sounds positive, no rebound, no ascites, no appreciable mass Musculoskeletal: No significant cyanosis, clubbing of bilateral lower extremities Neurological: Alert and oriented x 3, moves all extremities x 4 without focal neurological deficits, CN 2-12 intact  Scheduled Meds:  Scheduled Meds: . amLODipine  10 mg Oral Daily  . gabapentin  1,600 mg Oral BID   And  . gabapentin  800 mg Oral Q24H  . insulin aspart  0-15 Units Subcutaneous Q4H  . methadone  10 mg Oral Q8H  . metolazone  5 mg Oral Daily  . metoprolol  25 mg Oral BID  . traZODone  300 mg Oral QHS  . venlafaxine  75 mg Oral BID WC   Data Reviewed: Basic Metabolic Panel:  Recent Labs Lab 01/01/13 1732 01/01/13 1744 01/01/13 2056 01/02/13 0428  NA 135 139 140 140  K 4.7 4.2 5.1 4.5  CL 99 105 107 105  CO2 20  --   --    25  GLUCOSE 263* 252* 270* 233*  BUN 31* 33* 40* 40*  CREATININE 0.83 1.20 1.00 0.93  CALCIUM 8.9  --   --  8.9   Liver Function Tests:  Recent Labs Lab 01/01/13 1732  AST 34  ALT 30  ALKPHOS 54  BILITOT 0.4  PROT 6.2  ALBUMIN 2.9*   CBC:  Recent Labs Lab 01/01/13 1744 01/01/13 1830 01/01/13 2056 01/02/13 0428  WBC  --  16.9*  --  10.1  NEUTROABS  --  13.7*  --  7.7  HGB 10.9* 10.8* 11.2* 10.0*  HCT 32.0* 32.9* 33.0* 30.5*  MCV  --  92.9  --   93.3  PLT  --  264  --  217   CBG:  Recent Labs Lab 01/01/13 2331 01/02/13 0348 01/02/13 0832  GLUCAP 272* 220* 180*    Recent Results (from the past 240 hour(s))  MRSA PCR SCREENING     Status: None   Collection Time    01/02/13  3:15 AM      Result Value Range Status   MRSA by PCR NEGATIVE  NEGATIVE Final   Comment:            The GeneXpert MRSA Assay (FDA     approved for NASAL specimens     only), is one component of a     comprehensive MRSA colonization     surveillance program. It is not     intended to diagnose MRSA     infection nor to guide or     monitor treatment for     MRSA infections.     Studies:  Recent x-ray studies have been reviewed in detail by the Attending Physician     Allison Ellis, ANP [facility] Office  [phone] Pager [phone]  **If unable to reach the above provider after paging please contact the Flow Manager @ [phone]  On-Call/Text Page:      [email or web address]      password [email or web address]  If 7PM-7AM, please contact night-coverage [email or web address] Password [email or web address] 01/02/2013, 1:26 PM   LOS: 1 day   I have personally examined this patient and reviewed the entire database. I have reviewed the above note, made any necessary editorial changes, and agree with its content.  Jeffrey T. McClung, MD [facility]  

## 2013-01-03 ENCOUNTER — Encounter (HOSPITAL_COMMUNITY): Payer: Medicare Other | Admitting: Anesthesiology

## 2013-01-03 ENCOUNTER — Encounter (HOSPITAL_COMMUNITY): Payer: Self-pay

## 2013-01-03 ENCOUNTER — Encounter (HOSPITAL_COMMUNITY)
Admission: EM | Disposition: A | Discharge status: Skilled Nursing Facility | Payer: Self-pay | Source: Home / Self Care | Attending: Internal Medicine

## 2013-01-03 ENCOUNTER — Inpatient Hospital Stay (HOSPITAL_COMMUNITY): Payer: Medicare Other | Admitting: Anesthesiology

## 2013-01-03 DIAGNOSIS — I10 Essential (primary) hypertension: Secondary | ICD-10-CM

## 2013-01-03 DIAGNOSIS — D62 Acute posthemorrhagic anemia: Secondary | ICD-10-CM

## 2013-01-03 HISTORY — PX: ESOPHAGOGASTRODUODENOSCOPY: SHX5428

## 2013-01-03 LAB — GLUCOSE, CAPILLARY
Glucose-Capillary: 138 mg/dL — ABNORMAL HIGH (ref 70–99)
Glucose-Capillary: 143 mg/dL — ABNORMAL HIGH (ref 70–99)

## 2013-01-03 LAB — CBC
HCT: 25.7 % — ABNORMAL LOW (ref 39.0–52.0)
Hemoglobin: 8.4 g/dL — ABNORMAL LOW (ref 13.0–17.0)
MCV: 94.5 fL (ref 78.0–100.0)
RBC: 2.72 MIL/uL — ABNORMAL LOW (ref 4.22–5.81)
WBC: 10.3 10*3/uL (ref 4.0–10.5)

## 2013-01-03 LAB — COMPREHENSIVE METABOLIC PANEL
Albumin: 3.2 g/dL — ABNORMAL LOW (ref 3.5–5.2)
Alkaline Phosphatase: 48 U/L (ref 39–117)
BUN: 21 mg/dL (ref 6–23)
CO2: 26 mEq/L (ref 19–32)
Chloride: 101 mEq/L (ref 96–112)
Creatinine, Ser: 0.81 mg/dL (ref 0.50–1.35)
GFR calc Af Amer: >90 mL/min (ref >90)
GFR calc non Af Amer: >90 mL/min (ref >90)
Glucose, Bld: 132 mg/dL — ABNORMAL HIGH (ref 70–99)
Potassium: 3.8 mEq/L (ref 3.5–5.1)
Total Bilirubin: 0.3 mg/dL (ref 0.3–1.2)
Total Protein: 6 g/dL (ref 6.0–8.3)

## 2013-01-03 SURGERY — EGD (ESOPHAGOGASTRODUODENOSCOPY)
Anesthesia: Choice

## 2013-01-03 SURGERY — EGD (ESOPHAGOGASTRODUODENOSCOPY)
Anesthesia: General

## 2013-01-03 MED ORDER — PROMETHAZINE HCL 25 MG/ML IJ SOLN: 6.2500 mg | INTRAMUSCULAR | Status: DC | PRN

## 2013-01-03 MED ORDER — LIDOCAINE HCL (PF) 2 % IJ SOLN
INTRAMUSCULAR | Status: DC | PRN
  Administered 2013-01-03: 40 mg

## 2013-01-03 MED ORDER — MEPERIDINE HCL 100 MG/ML IJ SOLN: 6.2500 mg | INTRAMUSCULAR | Status: DC | PRN

## 2013-01-03 MED ORDER — LACTATED RINGERS IV SOLN
INTRAVENOUS | Status: DC | PRN
  Administered 2013-01-03: 14:00:00 via INTRAVENOUS

## 2013-01-03 MED ORDER — LIVING WELL WITH DIABETES BOOK
Freq: Once | Status: DC
  Filled 2013-01-03: qty 1

## 2013-01-03 MED ORDER — INSULIN ASPART 100 UNIT/ML ~~LOC~~ SOLN
0.0000 [IU] | Freq: Three times a day (TID) | SUBCUTANEOUS | Status: DC
  Administered 2013-01-04: 5 [IU] via SUBCUTANEOUS
  Administered 2013-01-04 – 2013-01-05 (×4): 3 [IU] via SUBCUTANEOUS

## 2013-01-03 MED ORDER — FENTANYL CITRATE 0.05 MG/ML IJ SOLN
INTRAMUSCULAR | Status: DC | PRN
  Administered 2013-01-03 (×2): 50 ug via INTRAVENOUS

## 2013-01-03 MED ORDER — PROPOFOL 10 MG/ML IV BOLUS
INTRAVENOUS | Status: DC | PRN
  Administered 2013-01-03: 250 mg via INTRAVENOUS

## 2013-01-03 MED ORDER — LACTATED RINGERS IV SOLN: INTRAVENOUS | Status: DC

## 2013-01-03 MED ORDER — SUCCINYLCHOLINE CHLORIDE 20 MG/ML IJ SOLN
INTRAMUSCULAR | Status: DC | PRN
  Administered 2013-01-03: 140 mg via INTRAVENOUS

## 2013-01-03 MED ORDER — ONDANSETRON HCL 4 MG/2ML IJ SOLN
INTRAMUSCULAR | Status: DC | PRN
  Administered 2013-01-03: 4 mg via INTRAVENOUS

## 2013-01-03 MED ORDER — INSULIN GLARGINE 100 UNIT/ML ~~LOC~~ SOLN
10.0000 [IU] | Freq: Every day | SUBCUTANEOUS | Status: DC
  Administered 2013-01-03 – 2013-01-04 (×2): 10 [IU] via SUBCUTANEOUS
  Filled 2013-01-03 (×4): qty 0.1

## 2013-01-03 NOTE — H&P (View-Only) (Signed)
[facility] Progress Note [place] TEAM 1 - Stepdown ICU Team   Phillip Maldonado WUJ:[id number] DOB: July 11, 1950 DOA: 01/01/2013 PCP: Warrick Parisian, MD  Brief narrative: 62 y.o. male who presented to the ED after an episode of vomiting BRB - 50cc in the ambulance followed by another episode of BRB vomiting in the ED and a single large melanotic BM in the ED. Symptoms began earlier in the afternoon of admission with symptoms of stomach "gurgling". 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 Due to tachycardia and hematemesis he was taken urgently to the OR for EGD (complex EGD anticipated due to prior gastric bypass and body habitus).  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 given history OSA -BP stable and no active bleeding so will decrease IVF to 30/hr since GI allowing clears    Diabetes mellitus -not on meds pre admit -check HgbA1c    OSA (obstructive sleep apnea) -HS CPAP    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 No abdominal pain. No recurrence of  hematemesis.  Objective: Blood pressure 141/62, pulse 88, temperature 98.1 F (36.7 C), temperature source Oral, resp. rate 20, height 5\' 10"  (1.778 m), weight 192.5 kg (424 lb 6.2 oz), SpO2 98.00%.  Intake/Output Summary (Last 24 hours) at 01/02/13 1326 Last data filed at 01/02/13 1300  Gross per 24 hour  Intake   2710 ml  Output   1975 ml  Net    735 ml   Exam: General: No acute respiratory distress Lungs: Clear to auscultation bilaterally without wheezes or crackles, 3L Cardiovascular: Regular rate and rhythm without murmur gallop or rub normal S1 and S2, no peripheral edema or JVD Abdomen: Nontender, nondistended, soft, bowel sounds positive, no rebound, no ascites, no appreciable mass Musculoskeletal: No significant cyanosis, clubbing of bilateral lower extremities Neurological: Alert and oriented x 3, moves all extremities x 4 without focal neurological deficits, CN 2-12 intact  Scheduled Meds:  Scheduled Meds: . amLODipine  10 mg Oral Daily  . gabapentin  1,600 mg Oral BID   And  . gabapentin  800 mg Oral Q24H  . insulin aspart  0-15 Units Subcutaneous Q4H  . methadone  10 mg Oral Q8H  . metolazone  5 mg Oral Daily  . metoprolol  25 mg Oral BID  . traZODone  300 mg Oral QHS  . venlafaxine  75 mg Oral BID WC   Data Reviewed: Basic Metabolic Panel:  Recent Labs Lab 01/01/13 1732 01/01/13 1744 01/01/13 2056 01/02/13 0428  NA 135 139 140 140  K 4.7 4.2 5.1 4.5  CL 99 105 107 105  CO2 20  --   --  25  GLUCOSE 263* 252* 270* 233*  BUN 31* 33* 40* 40*  CREATININE 0.83 1.20 1.00 0.93  CALCIUM 8.9  --   --  8.9   Liver Function Tests:  Recent Labs Lab 01/01/13 1732  AST 34  ALT 30  ALKPHOS 54  BILITOT 0.4  PROT 6.2  ALBUMIN 2.9*   CBC:  Recent Labs Lab 01/01/13 1744 01/01/13 1830 01/01/13 2056 01/02/13 0428  WBC  --  16.9*  --  10.1  NEUTROABS  --  13.7*  --  7.7  HGB 10.9* 10.8* 11.2* 10.0*  HCT 32.0* 32.9* 33.0* 30.5*  MCV  --  92.9  --   93.3  PLT  --  264  --  217   CBG:  Recent Labs Lab 01/01/13 2331 01/02/13 0348 01/02/13 0832  GLUCAP 272* 220* 180*    Recent Results (from the past 240 hour(s))  MRSA PCR SCREENING     Status: None   Collection Time    01/02/13  3:15 AM      Result Value Range Status   MRSA by PCR NEGATIVE  NEGATIVE Final   Comment:            The GeneXpert MRSA Assay (FDA     approved for NASAL specimens     only), is one component of a     comprehensive MRSA colonization     surveillance program. It is not     intended to diagnose MRSA     infection nor to guide or     monitor treatment for     MRSA infections.     Studies:  Recent x-ray studies have been reviewed in detail by the Attending Physician     Junious Silk, ANP [facility] Office  [phone] Pager [phone]  **If unable to reach the above provider after paging please contact the Flow Manager @ [phone]  On-Call/Text Page:      [email or web address]      password [email or web address]  If 7PM-7AM, please contact night-coverage [email or web address] Password [email or web address] 01/02/2013, 1:26 PM   LOS: 1 day   I have personally examined this patient and reviewed the entire database. I have reviewed the above note, made any necessary editorial changes, and agree with its content.  Lonia Blood, MD [facility]

## 2013-01-03 NOTE — Progress Notes (Signed)
[facility] Progress Note [place] TEAM 1 - Stepdown ICU Team   Phillip Maldonado ZOX:[id number] DOB: [birth year] DOA: 01/01/2013 PCP: Phillip Parisian, MD  Brief narrative: 62 y.o. male who presented to the ED after an episode of vomiting BRB - 50cc in the ambulance followed by another episode of BRB vomiting in the ED and a single large melanotic BM in the ED. Symptoms began earlier in the afternoon of admission with symptoms of stomach "gurgling". 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 Due to tachycardia and hematemesis he was taken urgently to the OR for EGD (complex EGD anticipated due to prior gastric bypass and body habitus).  Assessment/Plan:    UGIB (upper gastrointestinal bleed) -clots seen but no source of bleeding seen  -repeat EGD in OR  10/21-transported to [facility] for this procedure -no further hemetemesis -cont Protonix gtt    Acute blood loss anemia -hgb stable    HTN -moderate control -cont Norvasc and Lopressor- consider resume Cozaar once volume proven to be stable     History "CHF" -on Lasix and Aldactone pre admit - on hold since requiring IVFs -likely has at least DHF and possible RHF given history OSA -BP stable and no active bleeding so will decrease IVF to 30/hr since GI allowing clears    Diabetes mellitus -not on meds pre admit -HgbA1c 10.9- pt has short acting insulin at home- says checks CBG but never has to use insulin- says am fasting CBG ~150 -will start low dose Lantus today -diabetes and diet education- based on our interview his intake is not diabetic or heart healthy appropriate- pt is widower and tends to eat fast food or microwaveable foods    OSA (obstructive sleep apnea) -HS CPAP    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  Objective: Blood pressure 117/67, pulse 87, temperature 98.6 F (37 C), temperature source Oral, resp. rate 22, height 5\' 10"  (1.778 m), weight 192.5 kg (424 lb 6.2 oz), SpO2 99.00%.  Intake/Output Summary (Last 24 hours) at 01/03/13 1327 Last data filed at 01/03/13 1100  Gross per 24 hour  Intake   1820 ml  Output   2400 ml  Net   -580 ml   Exam: General: No acute respiratory distress Lungs: Clear to auscultation bilaterally without wheezes or crackles, RA Cardiovascular: Regular rate and rhythm without murmur gallop or rub normal S1 and S2, no peripheral edema or JVD Abdomen: Nontender, nondistended, soft, bowel sounds positive, no rebound, no ascites, no appreciable mass Musculoskeletal: No significant cyanosis, clubbing of bilateral lower extremities Neurological: Alert and oriented x 3, moves all extremities x 4 without focal neurological deficits, CN 2-12 intact  Scheduled Meds:  Scheduled Meds: . [MAR HOLD] amLODipine  10 mg Oral Daily  . [facility] HOLD] gabapentin  1,600 mg Oral BID   And  . [MAR HOLD] gabapentin  800 mg Oral Q24H  . [MAR HOLD] insulin aspart  0-15 Units Subcutaneous Q4H  . [MAR HOLD] insulin glargine  10 Units Subcutaneous QHS  . [facility] HOLD] living well with diabetes book   Does not apply  Once  . [facility] HOLD] methadone  10 mg Oral Q8H  . [MAR HOLD] metolazone  5 mg Oral Daily  . [facility] HOLD] metoprolol  25 mg Oral BID  . [facility] HOLD] traZODone  300 mg Oral QHS  . [facility] HOLD] venlafaxine  75 mg Oral BID WC   Data Reviewed: Basic Metabolic Panel:  Recent Labs Lab 01/01/13 1732 01/01/13 1744 01/01/13 2056 01/02/13 0428 01/03/13 0505  NA 135 139 140 140 137  K 4.7 4.2 5.1 4.5 3.8  CL 99 105 107 105  101  CO2 20  --   --  25 26  GLUCOSE 263* 252* 270* 233* 132*  BUN 31* 33* 40* 40* 21  CREATININE 0.83 1.20 1.00 0.93 0.81  CALCIUM 8.9  --   --  8.9 8.6   Liver Function Tests:  Recent Labs Lab 01/01/13 1732 01/03/13 0505  AST 34 27  ALT 30 25  ALKPHOS 54 48  BILITOT 0.4 0.3  PROT 6.2 6.0  ALBUMIN 2.9* 3.2*   CBC:  Recent Labs Lab 01/01/13 1744 01/01/13 1830 01/01/13 2056 01/02/13 0428 01/03/13 0505  WBC  --  16.9*  --  10.1 10.3  NEUTROABS  --  13.7*  --  7.7  --   HGB 10.9* 10.8* 11.2* 10.0* 8.4*  HCT 32.0* 32.9* 33.0* 30.5* 25.7*  MCV  --  92.9  --  93.3 94.5  PLT  --  264  --  217 210   CBG:  Recent Labs Lab 01/02/13 1953 01/03/13 0042 01/03/13 0330 01/03/13 0829 01/03/13 1254  GLUCAP 207* 128* 125* 138* 153*    Recent Results (from the past 240 hour(s))  MRSA PCR SCREENING     Status: None   Collection Time    01/02/13  3:15 AM      Result Value Range Status   MRSA by PCR NEGATIVE  NEGATIVE Final   Comment:            The GeneXpert MRSA Assay (FDA     approved for NASAL specimens     only), is one component of a     comprehensive MRSA colonization     surveillance program. It is not     intended to diagnose MRSA     infection nor to guide or     monitor treatment for     MRSA infections.     Studies:  Recent x-ray studies have been reviewed in detail by the Attending Physician     Junious Silk, ANP [facility] Office  [phone] Pager [phone]  **If unable to reach the above provider after paging please contact the Flow Manager @ [phone]  On-Call/Text Page:      [email or web address]      password [email or web address]  If 7PM-7AM, please contact night-coverage [email or web address] Password [email or web address] 01/03/2013, 1:27 PM   LOS: 2 days    I have examined the patient, reviewed the chart and modified the above note which I agree with.   Phillip Branagan,MD [phone] 01/03/2013, 4:49 PM

## 2013-01-03 NOTE — Interval H&P Note (Signed)
History and Physical Interval Note:  01/03/2013 2:30 PM  Phillip Maldonado  has presented today for surgery, with the diagnosis of gi bleed  The various methods of treatment have been discussed with the patient and family. After consideration of risks, benefits and other options for treatment, the patient has consented to  Procedure(s): ESOPHAGOGASTRODUODENOSCOPY (EGD) (N/A) as a surgical intervention .  The patient's history has been reviewed, patient examined, no change in status, stable for surgery.  I have reviewed the patient's chart and labs.  Questions were answered to the patient's satisfaction.     Phillip Maldonado  Assessment:  1.  Hematemesis.  Unable to well visualize gastric anatomy, in setting of large amount of clotted blood obscuring views.  Plan:  1.  Endoscopy under general anesthesia. 2.  Risks (bleeding, infection, bowel perforation that could require surgery, sedation-related changes in cardiopulmonary systems), benefits (identification and possible treatment of source of symptoms, exclusion of certain causes of symptoms), and alternatives (watchful waiting, radiographic imaging studies, empiric medical treatment) of upper endoscopy (EGD) were explained to patient/family in detail and patient wishes to proceed.

## 2013-01-03 NOTE — Preoperative (Signed)
Beta Blockers   Reason not to administer Beta Blockers:Not Applicable 

## 2013-01-03 NOTE — Anesthesia Postprocedure Evaluation (Signed)
Anesthesia Post Note  Patient: Phillip Maldonado  Procedure(s) Performed: Procedure(s) (LRB): ESOPHAGOGASTRODUODENOSCOPY (EGD) (N/A)  Anesthesia type: General  Patient location: PACU  Post pain: Pain level controlled  Post assessment: Post-op Vital signs reviewed  Last Vitals:  Filed Vitals:   01/03/13 1600  BP:   Pulse: 88  Temp: 36.7 C  Resp: 22    Post vital signs: Reviewed  Level of consciousness: sedated  Complications: No apparent anesthesia complications

## 2013-01-03 NOTE — Progress Notes (Signed)
Nutrition Consult--Brief Note  Received consult for diet education. Patient is currently at [facility] for an endoscopic procedure. Will follow-up at a later date and time to evaluate for diet education.   Joaquin Courts, RD, LDN, CNSC Pager [phone] After Hours Pager [phone]

## 2013-01-03 NOTE — Transfer of Care (Signed)
Immediate Anesthesia Transfer of Care Note  Patient: Phillip Maldonado  Procedure(s) Performed: Procedure(s) (LRB): ESOPHAGOGASTRODUODENOSCOPY (EGD) (N/A)  Patient Location: PACU  Anesthesia Type: General  Level of Consciousness: sedated, patient cooperative and responds to stimulation  Airway & Oxygen Therapy: Patient Spontanous Breathing and Patient connected to face mask oxgen  Post-op Assessment: Report given to PACU RN and Post -op Vital signs reviewed and stable  Post vital signs: Reviewed and stable  Complications: No apparent anesthesia complications

## 2013-01-03 NOTE — Anesthesia Preprocedure Evaluation (Addendum)
Anesthesia Evaluation  Patient identified by MRN, date of birth, ID band Patient awake    Reviewed: Allergy & Precautions, H&P , NPO status , Patient's Chart, lab work & pertinent test results  Airway Mallampati: II TM Distance: >3 FB Neck ROM: Full    Dental   Pulmonary shortness of breath, sleep apnea ,    + decreased breath sounds      Cardiovascular hypertension, +CHF Rhythm:Regular Rate:Normal     Neuro/Psych  Headaches, Anxiety Depression  Neuromuscular disease negative neurological ROS  negative psych ROS   GI/Hepatic Neg liver ROS, (+) Hepatitis -UGI bleeding   Endo/Other  diabetesMorbid obesity  Renal/GU negative Renal ROS  negative genitourinary   Musculoskeletal negative musculoskeletal ROS (+)   Abdominal (+) + obese,   Peds negative pediatric ROS (+)  Hematology negative hematology ROS (+)   Anesthesia Other Findings   Reproductive/Obstetrics negative OB ROS                          Anesthesia Physical  Anesthesia Plan  ASA: III and emergent  Anesthesia Plan: General   Post-op Pain Management:    Induction: Intravenous  Airway Management Planned: Oral ETT and Video Laryngoscope Planned  Additional Equipment:   Intra-op Plan:   Post-operative Plan: Extubation in OR  Informed Consent: I have reviewed the patients History and Physical, chart, labs and discussed the procedure including the risks, benefits and alternatives for the proposed anesthesia with the patient or authorized representative who has indicated his/her understanding and acceptance.     Plan Discussed with: CRNA and Surgeon  Anesthesia Plan Comments:        Anesthesia Quick Evaluation

## 2013-01-03 NOTE — Op Note (Signed)
[facility] [address] [place], [address]   ENDOSCOPY PROCEDURE REPORT  PATIENT: Phillip Maldonado, Phillip Maldonado  MR#: [id number] BIRTHDATE: [birth year] , 61  yrs. old GENDER: Male ENDOSCOPIST: Willis Modena, MD REFERRED BY:  [facility] PROCEDURE DATE:  01/03/2013 PROCEDURE:  EGD, diagnostic ASA CLASS:     Class III INDICATIONS:  hematemesis. MEDICATIONS: General endotracheal anesthesia (GETA)  DESCRIPTION OF PROCEDURE: After the risks benefits and alternatives of the procedure were thoroughly explained, informed consent was obtained.  The Pentax Gastroscope [id number] endoscope was introduced through the mouth and advanced to the second portion of the duodenum. Without limitations.  The instrument was slowly withdrawn as the mucosa was fully examined.     Findings:  Normal esophagus.  Surgical suture in the cardia with some modest luminal stenosis in this area, likely reflective of prior gastric surgery.  Small amount of clot around one of these sutures, easily suctioned free.  A couple erosions neighboring the suture, as it impinges on the opposite luminal wall.  No discrete ulcer or bleeding source was seen.  Scope passed through this stenosis with minimal no difficulty.  Remainder of stomach normal. Normal pylorus.  Normal duodenum  to the second portion. The scope was then withdrawn from the patient and the procedure completed.  ENDOSCOPIC IMPRESSION:     As above.  No discrete bleeding source seen.  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  Perhaps the surgical suture eroded into neighboring proximal gastric vessel to cause bleeding, which has subsequently stopped.  No ulcer or endoscopically treatable bleeding site was seen.  RECOMMENDATIONS:     1.  Watch for potential complications of procedure. 2.  PPI therapy. 3.  Full liquid diet, advance as tolerated. 4.  If patient has rebleeding, would consider repeat endoscopy. 5.  [facility] team to  follow.  eSigned:  Willis Modena, MD 01/03/2013 3:30 PM   CC:

## 2013-01-04 ENCOUNTER — Encounter (HOSPITAL_COMMUNITY): Payer: Self-pay | Admitting: Gastroenterology

## 2013-01-04 LAB — BASIC METABOLIC PANEL
BUN: 13 mg/dL (ref 6–23)
Calcium: 8.8 mg/dL (ref 8.4–10.5)
GFR calc Af Amer: >90 mL/min (ref >90)
GFR calc non Af Amer: >90 mL/min (ref >90)
Glucose, Bld: 165 mg/dL — ABNORMAL HIGH (ref 70–99)
Potassium: 3.2 mEq/L — ABNORMAL LOW (ref 3.5–5.1)
Sodium: 136 mEq/L (ref 135–145)

## 2013-01-04 LAB — CBC
HCT: 25 % — ABNORMAL LOW (ref 39.0–52.0)
Hemoglobin: 8.3 g/dL — ABNORMAL LOW (ref 13.0–17.0)
MCH: 30.9 pg (ref 26.0–34.0)
MCHC: 33.2 g/dL (ref 30.0–36.0)
Platelets: 183 10*3/uL (ref 150–400)
RDW: 14.9 % (ref 11.5–15.5)

## 2013-01-04 LAB — GLUCOSE, CAPILLARY
Glucose-Capillary: 172 mg/dL — ABNORMAL HIGH (ref 70–99)
Glucose-Capillary: 176 mg/dL — ABNORMAL HIGH (ref 70–99)
Glucose-Capillary: 218 mg/dL — ABNORMAL HIGH (ref 70–99)

## 2013-01-04 MED ORDER — PANTOPRAZOLE SODIUM 40 MG IV SOLR
40.0000 mg | Freq: Two times a day (BID) | INTRAVENOUS | Status: DC
  Administered 2013-01-04: 40 mg via INTRAVENOUS
  Filled 2013-01-04 (×3): qty 40

## 2013-01-04 MED ORDER — PANTOPRAZOLE SODIUM 40 MG IV SOLR
40.0000 mg | Freq: Two times a day (BID) | INTRAVENOUS | Status: DC
  Filled 2013-01-04 (×2): qty 40

## 2013-01-04 MED ORDER — POTASSIUM CHLORIDE CRYS ER 20 MEQ PO TBCR
40.0000 meq | EXTENDED_RELEASE_TABLET | Freq: Once | ORAL | Status: AC
  Administered 2013-01-04: 40 meq via ORAL
  Filled 2013-01-04: qty 2

## 2013-01-04 MED ORDER — LOSARTAN POTASSIUM 50 MG PO TABS
100.0000 mg | ORAL_TABLET | Freq: Every day | ORAL | Status: DC
  Administered 2013-01-04 – 2013-01-05 (×2): 100 mg via ORAL
  Filled 2013-01-04 (×2): qty 2

## 2013-01-04 NOTE — Progress Notes (Signed)
Pt had a CPAP order. Pt stated he knows he needs to wear it but he will wait until he goes home.

## 2013-01-04 NOTE — Progress Notes (Signed)
Subjective: No further bleeding. Tolerating diet.  Objective: Vital signs in last 24 hours: Temp:  [97.9 F (36.6 C)-98.6 F (37 C)] 98.2 F (36.8 C) (10/22 1200) Pulse Rate:  [79-97] 81 (10/22 1200) Resp:  [12-21] 16 (10/22 1200) BP: (129-146)/(62-83) 131/69 mmHg (10/22 1200) SpO2:  [83 %-98 %] 96 % (10/22 1200) Weight change:  Last BM Date: 01/02/13  PE: GEN:  NAD  Lab Results: CBC    Component Value Date/Time   WBC 8.1 01/04/2013 0351   RBC 2.69* 01/04/2013 0351   HGB 8.3* 01/04/2013 0351   HCT 25.0* 01/04/2013 0351   PLT 183 01/04/2013 0351   MCV 92.9 01/04/2013 0351   MCH 30.9 01/04/2013 0351   MCHC 33.2 01/04/2013 0351   RDW 14.9 01/04/2013 0351   LYMPHSABS 1.4 01/02/2013 0428   MONOABS 1.0 01/02/2013 0428   EOSABS 0.0 01/02/2013 0428   BASOSABS 0.0 01/02/2013 0428   Assessment:  1.  Hematemesis.  No clear source on repeat EGD.  No further bleeding.  Recent decrease in Hgb is likely more equilibration rather than ongoing GI blood loss.  Plan:  1.  Advance diet. 2.  If no further bleeding, likely ok to discharge home tomorrow from GI perspective. 3.  Will follow.   Freddy Jaksch 01/04/2013, 4:58 PM

## 2013-01-04 NOTE — Progress Notes (Signed)
[facility] Progress Note [place] TEAM 1 - Stepdown ICU Team   Phillip Maldonado ZOX:[id number] DOB: Jan 17, 1951 DOA: 01/01/2013 PCP: Warrick Parisian, MD  Brief narrative: 62 y.o. male who presented to the ED after an episode of vomiting BRB - 50cc in the ambulance followed by another episode of BRB vomiting in the ED and a single large melanotic BM in the ED. Symptoms began earlier in the afternoon of admission with symptoms of stomach "gurgling". 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 Due to tachycardia and hematemesis he was taken urgently to the OR for EGD (complex EGD anticipated due to prior gastric bypass and body habitus).  Assessment/Plan:    UGIB (upper gastrointestinal bleed) -clots seen but no source of bleeding seen  -repeat EGD in OR 10/21 no definitive source of bleed and no active bleeding -no further hemetemesis -cont Protonix but transition to q 12 hr dosing -GI advancing diet -if re bleeds will need repeat endo    Acute blood loss anemia -hgb stable ~8.3    HTN -moderate control -cont Norvasc and Lopressor- will resume Cozaar now that volume proven to be stable     History "CHF" -on Lasix and Aldactone pre admit - on hold  -likely has at least chronic DHF and possible chronic RHF given history OSA -BP stable and no active bleeding so will chhange IVF to NSL since tolerating clears/diet    Diabetes mellitus -not on meds pre admit -HgbA1c 10.9- pt has short acting insulin at home- says checks CBG but never has to use insulin- says am fasting CBG ~150 -started Lantus this admission -diabetes and diet education- based on our interview his intake is not diabetic or heart healthy appropriate- pt is widower and tends to eat fast food or microwaveable foods    OSA (obstructive sleep apnea) -HS CPAP    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 No further symptoms c/w bleeding.  Objective: Blood pressure 142/83, pulse 97, temperature 98.5 F (36.9 C), temperature source Oral, resp. rate 12, height 5\' 10"  (1.778 m), weight 192.5 kg (424 lb 6.2 oz), SpO2 83.00%.  Intake/Output Summary (Last 24 hours) at 01/04/13 1111 Last data filed at 01/04/13 0900  Gross per 24 hour  Intake 2577.5 ml  Output   1350 ml  Net 1227.5 ml   Exam: General: No acute respiratory distress Lungs: Clear to auscultation bilaterally without wheezes or crackles, RA Cardiovascular: Regular rate and rhythm without murmur gallop or rub normal S1 and S2, no peripheral edema or JVD Abdomen: Nontender, nondistended, soft, bowel sounds positive, no rebound, no ascites, no appreciable mass Musculoskeletal: No significant cyanosis, clubbing of bilateral lower extremities   Scheduled Meds:  Scheduled Meds: . amLODipine  10 mg Oral Daily  . gabapentin  1,600 mg Oral BID   And  . gabapentin  800 mg Oral Q24H  . insulin aspart  0-15 Units Subcutaneous TID WC  . insulin glargine  10 Units Subcutaneous  QHS  . living well with diabetes book   Does not apply Once  . methadone  10 mg Oral Q8H  . metolazone  5 mg Oral Daily  . metoprolol  25 mg Oral BID  . potassium chloride  40 mEq Oral Once  . traZODone  300 mg Oral QHS  . venlafaxine  75 mg Oral BID WC   Data Reviewed: Basic Metabolic Panel:  Recent Labs Lab 01/01/13 1732 01/01/13 1744 01/01/13 2056 01/02/13 0428 01/03/13 0505  01/04/13 0351  NA 135 139 140 140 137 136  K 4.7 4.2 5.1 4.5 3.8 3.2*  CL 99 105 107 105 101 95*  CO2 20  --   --  25 26 29   GLUCOSE 263* 252* 270* 233* 132* 165*  BUN 31* 33* 40* 40* 21 13  CREATININE 0.83 1.20 1.00 0.93 0.81 0.89  CALCIUM 8.9  --   --  8.9 8.6 8.8   Liver Function Tests:  Recent Labs Lab 01/01/13 1732 01/03/13 0505  AST 34 27  ALT 30 25  ALKPHOS 54 48  BILITOT 0.4 0.3  PROT 6.2 6.0  ALBUMIN 2.9* 3.2*   CBC:  Recent Labs Lab 01/01/13 1830 01/01/13 2056 01/02/13 0428 01/03/13 0505 01/04/13 0351  WBC 16.9*  --  10.1 10.3 8.1  NEUTROABS 13.7*  --  7.7  --   --   HGB 10.8* 11.2* 10.0* 8.4* 8.3*  HCT 32.9* 33.0* 30.5* 25.7* 25.0*  MCV 92.9  --  93.3 94.5 92.9  PLT 264  --  217 210 183   CBG:  Recent Labs Lab 01/03/13 0829 01/03/13 1254 01/03/13 1531 01/03/13 2129 01/04/13 0729  GLUCAP 138* 153* 143* 209* 172*    Recent Results (from the past 240 hour(s))  MRSA PCR SCREENING     Status: None   Collection Time    01/02/13  3:15 AM      Result Value Range Status   MRSA by PCR NEGATIVE  NEGATIVE Final   Comment:            The GeneXpert MRSA Assay (FDA     approved for NASAL specimens     only), is one component of a     comprehensive MRSA colonization     surveillance program. It is not     intended to diagnose MRSA     infection nor to guide or     monitor treatment for     MRSA infections.     Studies:  Recent x-ray studies have been reviewed in detail by the Attending Physician     Junious Silk, ANP [facility] Office  [phone] Pager [phone]  **If unable to reach the above provider after paging please contact the Flow Manager @ [phone]  On-Call/Text Page:      [email or web address]      password [email or web address]  If 7PM-7AM, please contact night-coverage [email or web address] Password [email or web address] 01/04/2013, 11:11 AM   LOS: 3 days   I have personally examined this patient and reviewed the entire database. I have reviewed the above  note, made any necessary editorial changes, and agree with its content.  Lonia Blood, MD [facility]

## 2013-01-05 LAB — GLUCOSE, CAPILLARY: Glucose-Capillary: 170 mg/dL — ABNORMAL HIGH (ref 70–99)

## 2013-01-05 LAB — BASIC METABOLIC PANEL
Calcium: 8.9 mg/dL (ref 8.4–10.5)
Chloride: 91 mEq/L — ABNORMAL LOW (ref 96–112)
Creatinine, Ser: 1.29 mg/dL (ref 0.50–1.35)
GFR calc Af Amer: 68 mL/min — ABNORMAL LOW (ref >90)
Sodium: 133 mEq/L — ABNORMAL LOW (ref 135–145)

## 2013-01-05 LAB — CBC
Platelets: 242 10*3/uL (ref 150–400)
RDW: 15.2 % (ref 11.5–15.5)
WBC: 8.5 10*3/uL (ref 4.0–10.5)

## 2013-01-05 MED ORDER — PANTOPRAZOLE SODIUM 40 MG PO TBEC
40.0000 mg | DELAYED_RELEASE_TABLET | Freq: Every day | ORAL | Status: DC
  Administered 2013-01-05: 13:00:00 40 mg via ORAL
  Filled 2013-01-05: qty 1

## 2013-01-05 MED ORDER — INSULIN GLARGINE 100 UNIT/ML ~~LOC~~ SOLN: 10.0000 [IU] | Freq: Every day | SUBCUTANEOUS | Status: DC

## 2013-01-05 MED ORDER — PANTOPRAZOLE SODIUM 40 MG IV SOLR: 40.0000 mg | Freq: Two times a day (BID) | INTRAVENOUS | Status: DC

## 2013-01-05 MED ORDER — INFLUENZA VAC SPLIT QUAD 0.5 ML IM SUSP
0.5000 mL | Freq: Once | INTRAMUSCULAR | Status: AC
  Administered 2013-01-05: 15:00:00 0.5 mL via INTRAMUSCULAR
  Filled 2013-01-05: qty 0.5

## 2013-01-05 MED ORDER — PANTOPRAZOLE SODIUM 40 MG PO TBEC: 40.0000 mg | DELAYED_RELEASE_TABLET | Freq: Every day | ORAL | Status: DC

## 2013-01-05 MED ORDER — PNEUMOCOCCAL VAC POLYVALENT 25 MCG/0.5ML IJ INJ
0.5000 mL | INJECTION | Freq: Once | INTRAMUSCULAR | Status: AC
  Administered 2013-01-05: 15:00:00 0.5 mL via INTRAMUSCULAR
  Filled 2013-01-05: qty 0.5

## 2013-01-05 MED ORDER — INSULIN ASPART 100 UNIT/ML ~~LOC~~ SOLN: SUBCUTANEOUS | Status: DC

## 2013-01-05 MED ORDER — METHADONE HCL 10 MG PO TABS: 10.0000 mg | ORAL_TABLET | Freq: Three times a day (TID) | ORAL | Status: DC

## 2013-01-05 NOTE — Discharge Summary (Addendum)
PATIENT DETAILS Name: Phillip Maldonado Age: 62 y.o. Sex: male Date of Birth: 18-Apr-1950 MRN: [id number]. Admit Date: 01/01/2013 Admitting Physician: Hillary Bow, DO WUJ:[id number],NFAOZH, MD  Recommendations for Outpatient Follow-up:  1. Please recheck CBC in one week 2. Please stop aspirin for 2-3 weeks, resume only after speaking with gastroenterology  PRIMARY DISCHARGE DIAGNOSIS:  Active Problems:   HTN (hypertension)   Diabetes mellitus   OSA (obstructive sleep apnea)   UGIB (upper gastrointestinal bleed)   Acute blood loss anemia   History of gastric bypass      PAST MEDICAL HISTORY: Past Medical History  Diagnosis Date  . CHF (congestive heart failure)   . Hypertension   . Anemia   . Sleep apnea, obstructive     does where bipap  . Angina   . Shortness of breath   . Anxiety   . Blood transfusion   . Headache(784.0)   . Pneumonia   . Depression   . Hepatitis Hep B  . Neuromuscular disorder DJD  . Arthritis   . Diabetes mellitus 09/18/2011    DISCHARGE MEDICATIONS:   Medication List    STOP taking these medications       aspirin 325 MG EC tablet     HYDROmorphone 2 MG tablet  Commonly known as:  DILAUDID     potassium chloride SA 20 MEQ tablet  Commonly known as:  K-DUR,KLOR-CON      TAKE these medications       amLODipine 10 MG tablet  Commonly known as:  NORVASC  Take 10 mg by mouth daily.     ANDROGEL PUMP 12.5 MG/ACT (1%) Gel  Generic drug:  Testosterone  Place 1 application onto the skin daily.     atorvastatin 10 MG tablet  Commonly known as:  LIPITOR  Take 10 mg by mouth daily.     desvenlafaxine 50 MG 24 hr tablet  Commonly known as:  PRISTIQ  Take 50 mg by mouth daily.     furosemide 40 MG tablet  Commonly known as:  LASIX  Take by mouth 2 (two) times daily. 1 1/2 tabs     gabapentin 800 MG tablet  Commonly known as:  NEURONTIN  Take 800-1,600 mg by mouth 3 (three) times daily. 1600mg  in the morning, 800mg  in the  afternoon, and 1600mg  at night.     insulin aspart 100 UNIT/ML injection  Commonly known as:  novoLOG  - 0-15 Units, Subcutaneous, 3 times daily with meals  - CBG < 70: implement hypoglycemia protocol  - CBG 70 - 120: 0 units  - CBG 121 - 150: 2 units  - CBG 151 - 200: 3 units  - CBG 201 - 250: 5 units  - CBG 251 - 300: 8 units  - CBG 301 - 350: 11 units  - CBG 351 - 400: 15 units  - CBG > 400     insulin glargine 100 UNIT/ML injection  Commonly known as:  LANTUS  Inject 0.1 mLs (10 Units total) into the skin at bedtime.     losartan 100 MG tablet  Commonly known as:  COZAAR  Take 100 mg by mouth daily.     methadone 10 MG tablet  Commonly known as:  DOLOPHINE  Take 1 tablet (10 mg total) by mouth 3 (three) times daily.     metolazone 5 MG tablet  Commonly known as:  ZAROXOLYN  Take 5 mg by mouth daily.     metoprolol 50 MG tablet  Commonly  known as:  LOPRESSOR  Take 25 mg by mouth 2 (two) times daily.     multivitamin with minerals Tabs tablet  Take 1 tablet by mouth daily.     nitroGLYCERIN 0.4 MG SL tablet  Commonly known as:  NITROSTAT  Place 0.4 mg under the tongue every 5 (five) minutes as needed. For chest pain     pantoprazole 40 MG tablet  Commonly known as:  PROTONIX  Take 1 tablet (40 mg total) by mouth daily.     polycarbophil 625 MG tablet  Commonly known as:  FIBERCON  Take 625 mg by mouth daily.     polyvinyl alcohol 1.4 % ophthalmic solution  Commonly known as:  LIQUIFILM TEARS  Place 1 drop into both eyes daily as needed. For dry eyes     traZODone 100 MG tablet  Commonly known as:  DESYREL  Take 300 mg by mouth at bedtime.     venlafaxine 75 MG tablet  Commonly known as:  EFFEXOR  Take 75 mg by mouth 2 (two) times daily.        ALLERGIES:   Allergies  Allergen Reactions  . Adhesive [Tape] Other (See Comments)    unknown  . Feldene [Piroxicam] Other (See Comments)    unknown  . Latex     Rash and itching  . Morphine  And Related Other (See Comments)    unknown    BRIEF HPI:  See H&P, Labs, Consult and Test reports for all details in brief, Phillip Maldonado is a 62 y.o. male who presents to the ED after an episode of vomiting BRB. 50cc in the ambulence followed by another episode of BRB vomiting in the ED and a single large melanotic BM in the ED  CONSULTATIONS:   GI  PERTINENT RADIOLOGIC STUDIES: Dg Chest Port 1 View  01/01/2013   CLINICAL DATA:  Chest pain  EXAM: PORTABLE CHEST - 1 VIEW  COMPARISON:  05/08/2011  FINDINGS: Low lung volumes with bibasilar atelectasis. Upper normal heart size. Upper lungs clear. No pneumothorax.  IMPRESSION: Bibasilar atelectasis.   Electronically Signed   By: Maryclare Bean M.D.   On: 01/01/2013 18:29     PERTINENT LAB RESULTS: CBC:  Recent Labs  01/04/13 0351 01/05/13 0605  WBC 8.1 8.5  HGB 8.3* 8.5*  HCT 25.0* 25.8*  PLT 183 242   CMET CMP     Component Value Date/Time   NA 133* 01/05/2013 0605   K 3.3* 01/05/2013 0605   CL 91* 01/05/2013 0605   CO2 29 01/05/2013 0605   GLUCOSE 165* 01/05/2013 0605   BUN 17 01/05/2013 0605   CREATININE 1.29 01/05/2013 0605   CALCIUM 8.9 01/05/2013 0605   PROT 6.0 01/03/2013 0505   ALBUMIN 3.2* 01/03/2013 0505   AST 27 01/03/2013 0505   ALT 25 01/03/2013 0505   ALKPHOS 48 01/03/2013 0505   BILITOT 0.3 01/03/2013 0505   GFRNONAA 58* 01/05/2013 0605   GFRAA 68* 01/05/2013 0605    GFR Estimated Creatinine Clearance: 102.7 ml/min (by C-G formula based on Cr of 1.29). No results found for this basename: LIPASE, AMYLASE,  in the last 72 hours No results found for this basename: CKTOTAL, CKMB, CKMBINDEX, TROPONINI,  in the last 72 hours No components found with this basename: POCBNP,  No results found for this basename: DDIMER,  in the last 72 hours No results found for this basename: HGBA1C,  in the last 72 hours No results found for this basename: CHOL, HDL, LDLCALC,  TRIG, CHOLHDL, LDLDIRECT,  in the last 72  hours No results found for this basename: TSH, T4TOTAL, FREET3, T3FREE, THYROIDAB,  in the last 72 hours No results found for this basename: VITAMINB12, FOLATE, FERRITIN, TIBC, IRON, RETICCTPCT,  in the last 72 hours Coags: No results found for this basename: PT, INR,  in the last 72 hours Microbiology: Recent Results (from the past 240 hour(s))  MRSA PCR SCREENING     Status: None   Collection Time    01/02/13  3:15 AM      Result Value Range Status   MRSA by PCR NEGATIVE  NEGATIVE Final   Comment:            The GeneXpert MRSA Assay (FDA     approved for NASAL specimens     only), is one component of a     comprehensive MRSA colonization     surveillance program. It is not     intended to diagnose MRSA     infection nor to guide or     monitor treatment for     MRSA infections.     BRIEF HOSPITAL COURSE:  UGIB (upper gastrointestinal bleed)  - Patient was admitted, started on Protonix, and gastroenterology was consulted. EGD was done on 10/21, which showed clots but no definite source of bleeding or any active bleeding. Thankfully, he had no further hematemesis. He was then transitioned from continues Protonix infusion to Protonix 40 mg every 12 hours. His diet was also advanced. Hemoglobin subsequently stabilized. It is felt that the patient is currently stable for discharge. On discharge, we'll continue with PPI, avoid aspirin for the next 2-3 weeks, and resume only after getting clearance from gastroenterology.  Acute blood loss anemia  -hgb stable ~8.5 on  discharge  HTN  -moderate control  -cont Norvasc and Lopressor- will resume Cozaar now that volume proven to be stable   History "CHF"  -on Lasix and Aldactone pre admit - on hold on admission, and resuming on discharge.   Diabetes mellitus  -not on meds pre admit  -HgbA1c 10.9- - Patient has been started on Lantus and sliding scale insulin, this will be continued on discharge, further optimization of this regimen  can be done in the outpatient setting  OSA (obstructive sleep apnea)  -HS CPAP to be on discharge  History of gastric bypass  -initially done in 1977 and has had 2 revisions since   Shoulder pain  -on methadone pre admit with Neurontin, used to be on Dilaudid, however has not taken it for months.  Depression  -on Effexor, Trazadone, and Pristiq - Stable  Deconditioning - Suspect he would require SNF on discharge. This is likely secondary to hospitalization and acute blood loss.  TODAY-DAY OF DISCHARGE:  Subjective:   Phillip Maldonado today has no headache,no chest abdominal pain,no new weakness tingling or numbness, feels much better wants to go home today.   Objective:   Blood pressure 111/75, pulse 88, temperature 97.9 F (36.6 C), temperature source Oral, resp. rate 18, height 5\' 10"  (1.778 m), weight 192.5 kg (424 lb 6.2 oz), SpO2 93.00%.  Intake/Output Summary (Last 24 hours) at 01/05/13 1455 Last data filed at 01/04/13 2030  Gross per 24 hour  Intake    120 ml  Output      0 ml  Net    120 ml   Filed Weights   01/01/13 2207 01/02/13 0125  Weight: 181.439 kg (400 lb) 192.5 kg (424 lb 6.2 oz)  Exam Awake Alert, Oriented *3, No new F.N deficits, Normal affect [place].AT,PERRAL Supple Neck,No JVD, No cervical lymphadenopathy appriciated.  Symmetrical Chest wall movement, Good air movement bilaterally, CTAB RRR,No Gallops,Rubs or new Murmurs, No Parasternal Heave +ve B.Sounds, Abd Soft, Non tender, No organomegaly appriciated, No rebound -guarding or rigidity. No Cyanosis, Clubbing or edema, No new Rash or bruise  DISCHARGE CONDITION: Stable  DISPOSITION: SNF  DISCHARGE INSTRUCTIONS:    Activity:  As tolerated with Full fall precautions use walker/cane & assistance as needed  Diet recommendation: Diabetic Diet Heart Healthy diet Fluid restriction 1.8 lit/day   Discharge Orders   Future Orders Complete By Expires   Call MD for:  difficulty breathing,  headache or visual disturbances  As directed    Call MD for:  extreme fatigue  As directed    Call MD for:  persistant nausea and vomiting  As directed    Call MD for:  severe uncontrolled pain  As directed    Diet - low sodium heart healthy  As directed    Increase activity slowly  As directed       Follow-up Information   Follow up with Warrick Parisian, MD. Schedule an appointment as soon as possible for a visit in 1 week.   Specialty:  Family Medicine   Contact information:   [address] [place] [address] [phone]       Schedule an appointment as soon as possible for a visit with Freddy Jaksch, MD. (As needed)    Specialty:  Gastroenterology   Contact information:   [address], [address] [place] [address] [phone]       Follow up with Pamella Pert, MD. Schedule an appointment as soon as possible for a visit in 2 weeks.   Specialty:  Cardiology   Contact information:   [address] [place] [address] [phone]         Total Time spent on discharge equals 45 minutes.  SignedJeoffrey Massed 01/05/2013 2:55 PM

## 2013-01-05 NOTE — Clinical Social Work Placement (Signed)
Clinical Social Work Department CLINICAL SOCIAL WORK PLACEMENT NOTE 01/05/2013  Patient:  Phillip Maldonado, Phillip Maldonado  Account Number:  [id number] Admit date:  01/01/2013  Clinical Social Worker:  Cherre Blanc, [place]  Date/time:  01/05/2013 04:03 PM  Clinical Social Work is seeking post-discharge placement for this patient at the following level of care:   SKILLED NURSING   (*CSW will update this form in Epic as items are completed)   01/05/2013  Patient/family provided with [facility] Department of Clinical Social Work's list of facilities offering this level of care within the geographic area requested by the patient (or if unable, by the patient's family).  01/05/2013  Patient/family informed of their freedom to choose among providers that offer the needed level of care, that participate in Medicare, Medicaid or managed care program needed by the patient, have an available bed and are willing to accept the patient.  01/05/2013  Patient/family informed of [facility]' ownership interest in [facility], as well as of the fact that they are under no obligation to receive care at this facility.  PASARR submitted to EDS on  PASARR number received from EDS on   FL2 transmitted to all facilities in geographic area requested by pt/family on  01/05/2013 FL2 transmitted to all facilities within larger geographic area on 01/05/2013  Patient informed that his/her managed care company has contracts with or will negotiate with  certain facilities, including the following:     Patient/family informed of bed offers received:  01/05/2013 Patient chooses bed at [facility] Physician recommends and patient chooses bed at    Patient to be transferred to [facility] on  01/05/2013 Patient to be transferred to facility by Ambulance  The following physician request were entered in Epic:   Additional Comments: Per MD patient ready for DC 01/05/13. Patient  DC to [facility] 01/05/13. RN, patient, and facility notified of DC. RN given number to call report. Ambulance transport requested for patient. DC packet left on patient's shadow chart. CSW signing off.   Roddie Mc, [place], [place], [phone]

## 2013-01-05 NOTE — Evaluation (Signed)
Physical Therapy Evaluation Patient Details Name: Phillip Maldonado MRN: [id number] DOB: 05-07-1950 Today's Date: 01/05/2013 Time: 6962-9528 PT Time Calculation (min): 16 min  PT Assessment / Plan / Recommendation History of Present Illness  Pt adm with GI bleed.  Clinical Impression  Pt admitted with above. Pt currently with functional limitations due to the deficits listed below (see PT Problem List). Pt fatigues very quickly and unable to manage even household distances safely with mobility. Pt will benefit from skilled PT to increase their independence and safety with mobility to allow discharge to ST-SNF prior to return home.     PT Assessment  Patient needs continued PT services    Follow Up Recommendations  SNF    Does the patient have the potential to tolerate intense rehabilitation      Barriers to Discharge Decreased caregiver support      Equipment Recommendations  None recommended by PT    Recommendations for Other Services     Frequency Min 3X/week    Precautions / Restrictions Precautions Precautions: Fall   Pertinent Vitals/Pain Pt c/o lightheadedness with standing.      Mobility  Transfers Transfers: Sit to Stand;Stand to Sit Sit to Stand: 4: Min assist;With upper extremity assist;With armrests Stand to Sit: 4: Min assist;With armrests;To chair/3-in-1;4: Min guard Details for Transfer Assistance: Assist for balance and safety. Ambulation/Gait Ambulation/Gait Assistance: 4: Min assist Ambulation Distance (Feet): 10 Feet (x 2) Assistive device: 1 person hand held assist Ambulation/Gait Assistance Details: Pt unsteady on feet and reaching for furniture with other hand.  Becomes lightheaded when up. Gait Pattern: Step-through pattern;Decreased step length - right;Decreased step length - left;Shuffle;Trunk flexed Gait velocity: decr    Exercises     PT Diagnosis: Difficulty walking;Generalized weakness  PT Problem List: Decreased strength;Decreased  activity tolerance;Decreased balance;Decreased mobility PT Treatment Interventions: DME instruction;Gait training;Functional mobility training;Therapeutic activities;Therapeutic exercise;Patient/family education;Balance training     PT Goals(Current goals can be found in the care plan section) Acute Rehab PT Goals Patient Stated Goal: To get stronger so he can eventually get home. PT Goal Formulation: With patient Time For Goal Achievement: 01/12/13 Potential to Achieve Goals: Good  Visit Information  Last PT Received On: 01/05/13 Assistance Needed: +1 History of Present Illness: Pt adm with GI bleed.       Prior Functioning  Home Living Family/patient expects to be discharged to:: Private residence Living Arrangements: Alone Type of Home: Apartment Home Access: Level entry Home Layout: One level Home Equipment: Scientist, physiological Equipment: Reacher;Sock aid Prior Function Level of Independence: Independent with assistive device(s) Comments: uses cane in house and walker when out in community, Independent with ADLs Communication Communication: No difficulties Dominant Hand: Right    Cognition  Cognition Arousal/Alertness: Awake/alert Behavior During Therapy: WFL for tasks assessed/performed Overall Cognitive Status: Within Functional Limits for tasks assessed    Extremity/Trunk Assessment Upper Extremity Assessment Upper Extremity Assessment: Defer to OT evaluation Lower Extremity Assessment Lower Extremity Assessment: Generalized weakness   Balance Balance Balance Assessed: Yes Static Standing Balance Static Standing - Balance Support: No upper extremity supported Static Standing - Level of Assistance: 4: Min assist  End of Session PT - End of Session Activity Tolerance: Patient limited by fatigue Patient left: in chair;with call bell/phone within reach Nurse Communication: Mobility status  GP     [facility] 01/05/2013, 12:08 PM  [employer]  PT [phone]

## 2013-01-05 NOTE — Clinical Social Work Psychosocial (Signed)
Clinical Social Work Department BRIEF PSYCHOSOCIAL ASSESSMENT 01/05/2013  Patient:  Phillip Maldonado, Phillip Maldonado     Account Number:  [id number]     Admit date:  01/01/2013  Clinical Social Worker:  Lavell Luster  Date/Time:  01/05/2013 10:00 AM  Referred by:  Physician  Date Referred:  01/05/2013 Referred for  SNF Placement   Other Referral:   Interview type:  Patient Other interview type:   Patient alert and oriented x4 during assessment.    PSYCHOSOCIAL DATA Living Status:  ALONE Admitted from facility:   Level of care:   Primary support name:  Vivianne Spence [facility]) [phone] Primary support relationship to patient:  [place] Degree of support available:   Support is limited. Patient lives alone. Patient has two sisters in the area but they can't really do much for him.    CURRENT CONCERNS Current Concerns  Post-Acute Placement   Other Concerns:    SOCIAL WORK ASSESSMENT / PLAN CSW met with patient to discuss the recommendation for SNF placement and to explain SNF serach process. Patient is agreeable to SNF placement but states that he would not be willing to go to [facility] or [facility]. Patient had bad experience at [facility] in the past. Patient plans to return home after his SNF stay.   Assessment/plan status:  Psychosocial Support/Ongoing Assessment of Needs Other assessment/ plan:   Complete FL2, Fax out   Information/referral to community resources:   SNF list and CSW contact information given to patient.    PATIENT'S/FAMILY'S RESPONSE TO PLAN OF CARE: Patient is a very pleasant man who is agreeable to SNF placement. Patient was appreciative of CSW contact. Patient awaits bed offers.     Roddie Mc, [place], [place], [phone]

## 2013-01-05 NOTE — Progress Notes (Signed)
Subjective: No further bleeding.  Objective: Vital signs in last 24 hours: Temp:  [97.9 F (36.6 C)-98.6 F (37 C)] 97.9 F (36.6 C) (10/23 0502) Pulse Rate:  [86-134] 88 (10/23 1107) Resp:  [15-24] 18 (10/23 0502) BP: (102-148)/(65-80) 111/75 mmHg (10/23 1107) SpO2:  [92 %-100 %] 93 % (10/23 0502) Weight change:  Last BM Date: 01/02/13  PE: GEN:  NAD  Lab Results: CBC    Component Value Date/Time   WBC 8.5 01/05/2013 0605   RBC 2.74* 01/05/2013 0605   HGB 8.5* 01/05/2013 0605   HCT 25.8* 01/05/2013 0605   PLT 242 01/05/2013 0605   MCV 94.2 01/05/2013 0605   MCH 31.0 01/05/2013 0605   MCHC 32.9 01/05/2013 0605   RDW 15.2 01/05/2013 0605   LYMPHSABS 1.4 01/02/2013 0428   MONOABS 1.0 01/02/2013 0428   EOSABS 0.0 01/02/2013 0428   BASOSABS 0.0 01/02/2013 0428   Assessment:  1.  Hematemesis.  Unclear source.  [place]ul[place]?  Suture-related erosions?  Resolved.  Plan:  1.  Ok to discharge from GI standpoint. 2.  Can follow-up with [facility] on as-needed basis. 3.  Will sign-off; please call with questions.   Freddy Jaksch 01/05/2013, 1:35 PM

## 2013-01-05 NOTE — Progress Notes (Signed)
Phillip Maldonado to be D/C'd Skilled nursing facility [facility] per MD order.  Discussed with the patient and all questions fully answered.    Medication List    STOP taking these medications       aspirin 325 MG EC tablet     HYDROmorphone 2 MG tablet  Commonly known as:  DILAUDID     potassium chloride SA 20 MEQ tablet  Commonly known as:  K-DUR,KLOR-CON      TAKE these medications       amLODipine 10 MG tablet  Commonly known as:  NORVASC  Take 10 mg by mouth daily.     ANDROGEL PUMP 12.5 MG/ACT (1%) Gel  Generic drug:  Testosterone  Place 1 application onto the skin daily.     atorvastatin 10 MG tablet  Commonly known as:  LIPITOR  Take 10 mg by mouth daily.     desvenlafaxine 50 MG 24 hr tablet  Commonly known as:  PRISTIQ  Take 50 mg by mouth daily.     furosemide 40 MG tablet  Commonly known as:  LASIX  Take by mouth 2 (two) times daily. 1 1/2 tabs     gabapentin 800 MG tablet  Commonly known as:  NEURONTIN  Take 800-1,600 mg by mouth 3 (three) times daily. 1600mg  in the morning, 800mg  in the afternoon, and 1600mg  at night.     insulin aspart 100 UNIT/ML injection  Commonly known as:  novoLOG  - 0-15 Units, Subcutaneous, 3 times daily with meals  - CBG < 70: implement hypoglycemia protocol  - CBG 70 - 120: 0 units  - CBG 121 - 150: 2 units  - CBG 151 - 200: 3 units  - CBG 201 - 250: 5 units  - CBG 251 - 300: 8 units  - CBG 301 - 350: 11 units  - CBG 351 - 400: 15 units  - CBG > 400     insulin glargine 100 UNIT/ML injection  Commonly known as:  LANTUS  Inject 0.1 mLs (10 Units total) into the skin at bedtime.     losartan 100 MG tablet  Commonly known as:  COZAAR  Take 100 mg by mouth daily.     methadone 10 MG tablet  Commonly known as:  DOLOPHINE  Take 1 tablet (10 mg total) by mouth 3 (three) times daily.     metolazone 5 MG tablet  Commonly known as:  ZAROXOLYN  Take 5 mg by mouth daily.     metoprolol 50 MG tablet   Commonly known as:  LOPRESSOR  Take 25 mg by mouth 2 (two) times daily.     multivitamin with minerals Tabs tablet  Take 1 tablet by mouth daily.     nitroGLYCERIN 0.4 MG SL tablet  Commonly known as:  NITROSTAT  Place 0.4 mg under the tongue every 5 (five) minutes as needed. For chest pain     pantoprazole 40 MG tablet  Commonly known as:  PROTONIX  Take 1 tablet (40 mg total) by mouth daily.     polycarbophil 625 MG tablet  Commonly known as:  FIBERCON  Take 625 mg by mouth daily.     polyvinyl alcohol 1.4 % ophthalmic solution  Commonly known as:  LIQUIFILM TEARS  Place 1 drop into both eyes daily as needed. For dry eyes     traZODone 100 MG tablet  Commonly known as:  DESYREL  Take 300 mg by mouth at bedtime.     venlafaxine 75  MG tablet  Commonly known as:  EFFEXOR  Take 75 mg by mouth 2 (two) times daily.        VVS, Skin clean, dry and intact without evidence of skin break down, no evidence of skin tears noted. IV catheter discontinued intact. Site without signs and symptoms of complications. Dressing and pressure applied.  An After Visit Summary was printed and given to the patient. Follow up appointments , new prescriptions and medication administration times given Patient escorted via stretcher, and D/C home via Dudley Major, RN 01/05/2013 4:55 PM

## 2013-01-05 NOTE — Care Management Note (Signed)
    Page 1 of 1   01/05/2013     3:28:07 PM   CARE MANAGEMENT NOTE 01/05/2013  Patient:  Phillip Maldonado, Phillip Maldonado   Account Number:  [id number]  Date Initiated:  01/02/2013  Documentation initiated by:  Donn Pierini  Subjective/Objective Assessment:   Pt admitted with abd pain- GIB     Action/Plan:   PTA pt lived at home alone- NCM to follow for d/c needs  pt eval- rec snf.   Anticipated DC Date:  01/05/2013   Anticipated DC Plan:  SKILLED NURSING FACILITY  In-house referral  Clinical Social Worker      DC Planning Services  CM consult      Choice offered to / List presented to:             Status of service:  Completed, signed off Medicare Important Message given?   (If response is "NO", the following Medicare IM given date fields will be blank) Date Medicare IM given:   Date Additional Medicare IM given:    Discharge Disposition:  SKILLED NURSING FACILITY  Per UR Regulation:  Reviewed for med. necessity/level of care/duration of stay  If discussed at Long Length of Stay Meetings, dates discussed:    Comments:  01/05/13 15:27 Letha Cape RN, BSN [phone] patient dc to [facility] today, CSW following.

## 2013-01-05 NOTE — Evaluation (Signed)
Occupational Therapy Evaluation Patient Details Name: Phillip Maldonado MRN: [id number] DOB: December 05, 1950 Today's Date: 01/05/2013 Time: 8657-8469 OT Time Calculation (min): 34 min  OT Assessment / Plan / Recommendation History of present illness Pt adm with GI bleed.   Clinical Impression   Pt demos decline in function and safety with ADLs and ADL mobility and would benefit from acute OT services to address impairments to increase level of function and safety. Pt lives at home alone and states that he does not feel safe at home with current level of care and feelings of lightheadedness.     OT Assessment  Patient needs continued OT Services    Follow Up Recommendations  SNF    Barriers to Discharge Decreased caregiver support Pt lives at home alone  Equipment Recommendations  Other (comment) (TBD)    Recommendations for Other Services    Frequency  Min 2X/week    Precautions / Restrictions Precautions Precautions: Fall Restrictions Weight Bearing Restrictions: No   Pertinent Vitals/Pain 3/10 lower back, 6/10 B shoulders    ADL  Grooming: Performed;Wash/dry hands;Wash/dry face;Min guard Where Assessed - Grooming: Unsupported standing Upper Body Bathing: Simulated;Supervision/safety;Set up;Min guard Lower Body Bathing: Moderate assistance Upper Body Dressing: Performed;Supervision/safety;Set up;Min guard Lower Body Dressing: Performed;Moderate assistance Toilet Transfer: Minimal assistance;Simulated Toilet Transfer Method: Sit to stand Toileting - Clothing Manipulation and Hygiene: Performed;Minimal assistance Where Assessed - Engineer, mining and Hygiene: Standing Tub/Shower Transfer Method: Not assessed Transfers/Ambulation Related to ADLs: Assist for balance and safety.    OT Diagnosis: Generalized weakness  OT Problem List: Decreased strength;Impaired UE functional use;Impaired balance (sitting and/or standing);Decreased activity tolerance;Decreased  range of motion;Decreased knowledge of precautions;Decreased knowledge of use of DME or AE OT Treatment Interventions: Self-care/ADL training;Therapeutic exercise;Neuromuscular education;Balance training;Therapeutic activities;DME and/or AE instruction;Patient/family education   OT Goals(Current goals can be found in the care plan section) Acute Rehab OT Goals Patient Stated Goal: To get stronger so he can eventually get home. OT Goal Formulation: With patient Time For Goal Achievement: 01/12/13 Potential to Achieve Goals: Good ADL Goals Pt Will Perform Grooming: with set-up;with supervision;standing Pt Will Perform Upper Body Bathing: with supervision;with set-up;sitting;standing Pt Will Perform Lower Body Bathing: with min assist Pt Will Perform Upper Body Dressing: with set-up;with supervision;sitting;standing Pt Will Perform Lower Body Dressing: with min assist Pt Will Transfer to Toilet: with min guard assist;with supervision;regular height toilet;grab bars;ambulating Pt Will Perform Toileting - Clothing Manipulation and hygiene: with min guard assist  Visit Information  Last OT Received On: 01/05/13 Assistance Needed: +1 History of Present Illness: Pt adm with GI bleed.       Prior Functioning     Home Living Family/patient expects to be discharged to:: Private residence Living Arrangements: Alone Type of Home: Apartment Home Access: Level entry Home Layout: One level Home Equipment: Scientist, physiological Equipment: Reacher;Sock aid Prior Function Level of Independence: Independent with assistive device(s) Comments: uses cane in house and walker when out in community, Independent with ADLs Communication Communication: No difficulties Dominant Hand: Right         Vision/Perception Vision - History Baseline Vision: Wears glasses all the time Patient Visual Report: No change from baseline Perception Perception: Within Functional Limits   Cognition   Cognition Arousal/Alertness: Awake/alert Behavior During Therapy: WFL for tasks assessed/performed Overall Cognitive Status: Within Functional Limits for tasks assessed    Extremity/Trunk Assessment Upper Extremity Assessment Upper Extremity Assessment: Overall WFL for tasks assessed;Generalized weakness;RUE deficits/detail RUE Deficits / Details: decreased shoulder AROM approx 40-45 degrees, old  shoulder fx in 2011 per pt Lower Extremity Assessment Lower Extremity Assessment: Defer to PT evaluation Cervical / Trunk Assessment Cervical / Trunk Assessment: Normal     Mobility Bed Mobility Bed Mobility: Not assessed Transfers Sit to Stand: 4: Min assist;With upper extremity assist;With armrests;From chair/3-in-1 Stand to Sit: 4: Min assist;With armrests;To chair/3-in-1;4: Min guard Details for Transfer Assistance: Assist for balance and safety.     Exercise     Balance Balance Balance Assessed: Yes Static Standing Balance Static Standing - Balance Support: No upper extremity supported Static Standing - Level of Assistance: 4: Min assist Dynamic Standing Balance Dynamic Standing - Balance Support: No upper extremity supported;During functional activity Dynamic Standing - Level of Assistance: 4: Min assist   End of Session OT - End of Session Activity Tolerance: Other (comment) (c/o of lightheadedness) Patient left: in chair  GO     Galen Manila 01/05/2013, 1:08 PM

## 2013-01-16 ENCOUNTER — Emergency Department (HOSPITAL_COMMUNITY)
Admission: EM | Admit: 2013-01-16 | Discharge: 2013-01-16 | Disposition: A | Discharge status: Home / Self Care | Payer: Medicare Other | Attending: Emergency Medicine | Admitting: Emergency Medicine

## 2013-01-16 ENCOUNTER — Encounter (HOSPITAL_COMMUNITY): Payer: Self-pay | Admitting: Emergency Medicine

## 2013-01-16 ENCOUNTER — Emergency Department (HOSPITAL_COMMUNITY): Payer: Medicare Other

## 2013-01-16 DIAGNOSIS — Z5189 Encounter for other specified aftercare: Secondary | ICD-10-CM | POA: Insufficient documentation

## 2013-01-16 DIAGNOSIS — I10 Essential (primary) hypertension: Secondary | ICD-10-CM | POA: Insufficient documentation

## 2013-01-16 DIAGNOSIS — D649 Anemia, unspecified: Secondary | ICD-10-CM

## 2013-01-16 DIAGNOSIS — Z885 Allergy status to narcotic agent status: Secondary | ICD-10-CM | POA: Insufficient documentation

## 2013-01-16 DIAGNOSIS — I509 Heart failure, unspecified: Secondary | ICD-10-CM | POA: Insufficient documentation

## 2013-01-16 DIAGNOSIS — Z79899 Other long term (current) drug therapy: Secondary | ICD-10-CM | POA: Insufficient documentation

## 2013-01-16 DIAGNOSIS — M542 Cervicalgia: Secondary | ICD-10-CM | POA: Insufficient documentation

## 2013-01-16 DIAGNOSIS — F411 Generalized anxiety disorder: Secondary | ICD-10-CM | POA: Insufficient documentation

## 2013-01-16 DIAGNOSIS — Z8709 Personal history of other diseases of the respiratory system: Secondary | ICD-10-CM | POA: Insufficient documentation

## 2013-01-16 DIAGNOSIS — Z8679 Personal history of other diseases of the circulatory system: Secondary | ICD-10-CM | POA: Insufficient documentation

## 2013-01-16 DIAGNOSIS — Z794 Long term (current) use of insulin: Secondary | ICD-10-CM | POA: Insufficient documentation

## 2013-01-16 DIAGNOSIS — I959 Hypotension, unspecified: Secondary | ICD-10-CM | POA: Insufficient documentation

## 2013-01-16 DIAGNOSIS — E860 Dehydration: Secondary | ICD-10-CM | POA: Insufficient documentation

## 2013-01-16 DIAGNOSIS — E669 Obesity, unspecified: Secondary | ICD-10-CM | POA: Insufficient documentation

## 2013-01-16 DIAGNOSIS — F3289 Other specified depressive episodes: Secondary | ICD-10-CM | POA: Insufficient documentation

## 2013-01-16 DIAGNOSIS — Z8669 Personal history of other diseases of the nervous system and sense organs: Secondary | ICD-10-CM | POA: Insufficient documentation

## 2013-01-16 DIAGNOSIS — Z888 Allergy status to other drugs, medicaments and biological substances status: Secondary | ICD-10-CM | POA: Insufficient documentation

## 2013-01-16 DIAGNOSIS — G709 Myoneural disorder, unspecified: Secondary | ICD-10-CM | POA: Insufficient documentation

## 2013-01-16 DIAGNOSIS — E876 Hypokalemia: Secondary | ICD-10-CM | POA: Insufficient documentation

## 2013-01-16 DIAGNOSIS — M129 Arthropathy, unspecified: Secondary | ICD-10-CM | POA: Insufficient documentation

## 2013-01-16 DIAGNOSIS — M6281 Muscle weakness (generalized): Secondary | ICD-10-CM | POA: Insufficient documentation

## 2013-01-16 DIAGNOSIS — Z8719 Personal history of other diseases of the digestive system: Secondary | ICD-10-CM | POA: Insufficient documentation

## 2013-01-16 DIAGNOSIS — R609 Edema, unspecified: Secondary | ICD-10-CM | POA: Insufficient documentation

## 2013-01-16 DIAGNOSIS — R0602 Shortness of breath: Secondary | ICD-10-CM | POA: Insufficient documentation

## 2013-01-16 DIAGNOSIS — F329 Major depressive disorder, single episode, unspecified: Secondary | ICD-10-CM | POA: Insufficient documentation

## 2013-01-16 DIAGNOSIS — Z8701 Personal history of pneumonia (recurrent): Secondary | ICD-10-CM | POA: Insufficient documentation

## 2013-01-16 DIAGNOSIS — R42 Dizziness and giddiness: Secondary | ICD-10-CM | POA: Insufficient documentation

## 2013-01-16 DIAGNOSIS — Z91040 Latex allergy status: Secondary | ICD-10-CM | POA: Insufficient documentation

## 2013-01-16 DIAGNOSIS — R5381 Other malaise: Secondary | ICD-10-CM | POA: Insufficient documentation

## 2013-01-16 DIAGNOSIS — G4733 Obstructive sleep apnea (adult) (pediatric): Secondary | ICD-10-CM | POA: Insufficient documentation

## 2013-01-16 DIAGNOSIS — E119 Type 2 diabetes mellitus without complications: Secondary | ICD-10-CM | POA: Insufficient documentation

## 2013-01-16 DIAGNOSIS — R079 Chest pain, unspecified: Secondary | ICD-10-CM

## 2013-01-16 DIAGNOSIS — R0789 Other chest pain: Secondary | ICD-10-CM | POA: Insufficient documentation

## 2013-01-16 LAB — URINALYSIS, ROUTINE W REFLEX MICROSCOPIC
Bilirubin Urine: NEGATIVE
Ketones, ur: NEGATIVE mg/dL
Nitrite: NEGATIVE
Protein, ur: NEGATIVE mg/dL
Specific Gravity, Urine: 1.014 (ref 1.005–1.030)
Urobilinogen, UA: 0.2 mg/dL (ref 0.0–1.0)

## 2013-01-16 LAB — CBC
MCH: 29.3 pg (ref 26.0–34.0)
Platelets: 376 10*3/uL (ref 150–400)
RBC: 3.34 MIL/uL — ABNORMAL LOW (ref 4.22–5.81)
RDW: 15.2 % (ref 11.5–15.5)
WBC: 9.8 10*3/uL (ref 4.0–10.5)

## 2013-01-16 LAB — COMPREHENSIVE METABOLIC PANEL
AST: 54 U/L — ABNORMAL HIGH (ref 0–37)
Albumin: 3.5 g/dL (ref 3.5–5.2)
BUN: 28 mg/dL — ABNORMAL HIGH (ref 6–23)
CO2: 35 mEq/L — ABNORMAL HIGH (ref 19–32)
Chloride: 85 mEq/L — ABNORMAL LOW (ref 96–112)
Creatinine, Ser: 1.39 mg/dL — ABNORMAL HIGH (ref 0.50–1.35)
GFR calc non Af Amer: 53 mL/min — ABNORMAL LOW (ref >90)
Total Bilirubin: 0.4 mg/dL (ref 0.3–1.2)

## 2013-01-16 LAB — POCT I-STAT TROPONIN I

## 2013-01-16 LAB — PRO B NATRIURETIC PEPTIDE: Pro B Natriuretic peptide (BNP): 147.6 pg/mL — ABNORMAL HIGH (ref 0–125)

## 2013-01-16 MED ORDER — POTASSIUM CHLORIDE CRYS ER 20 MEQ PO TBCR
40.0000 meq | EXTENDED_RELEASE_TABLET | Freq: Once | ORAL | Status: AC
  Administered 2013-01-16: 40 meq via ORAL
  Filled 2013-01-16: qty 2

## 2013-01-16 MED ORDER — FUROSEMIDE 40 MG PO TABS: 40.0000 mg | ORAL_TABLET | Freq: Two times a day (BID) | ORAL | Status: DC

## 2013-01-16 MED ORDER — NITROGLYCERIN 0.4 MG SL SUBL: 0.4000 mg | SUBLINGUAL_TABLET | SUBLINGUAL | Status: DC | PRN

## 2013-01-16 MED ORDER — POTASSIUM CHLORIDE CRYS ER 20 MEQ PO TBCR: 20.0000 meq | EXTENDED_RELEASE_TABLET | Freq: Two times a day (BID) | ORAL | Status: DC

## 2013-01-16 NOTE — ED Provider Notes (Signed)
Face-to-face evaluation   History: His primary c/o is dizziness that improves with rest and sitting. He has felt poorly in a similar fashion since d/c from hospital after recent treatment for UGI bleeding. He has vague CP today that has occurred several times in the past week and resolves with rest. He is upset that he is not getting complete care at his rehab facility. He does not have other associated sx with the CP.   Physical exam: Obese.  Relative hypotension in ED but tolerates it well when sitting. Alert and conversant. Smiles and is cooperative. No respiratory distress.   Patient Vitals for the past 24 hrs:  BP Temp Temp src Pulse Resp SpO2 Height Weight  01/16/13 1700 - - - 81 15 95 % - -  01/16/13 1652 108/58 mmHg - - - - - - -  01/16/13 1545 111/65 mmHg - - 95 15 97 % - -  01/16/13 1541 105/51 mmHg 98.6 F (37 C) Oral 96 16 94 % 5\' 11"  (1.803 m) 390 lb (176.903 kg)  01/16/13 1534 - - - - - 98 % - -   Medications  nitroGLYCERIN (NITROSTAT) SL tablet 0.4 mg (not administered)  potassium chloride SA (K-DUR,KLOR-CON) CR tablet 40 mEq (not administered)   * Treated with only Potassium in ED.     Results for orders placed during the hospital encounter of 01/16/13  COMPREHENSIVE METABOLIC PANEL      Result Value Range   Sodium 132 (*) 135 - 145 mEq/L   Potassium 2.7 (*) 3.5 - 5.1 mEq/L   Chloride 85 (*) 96 - 112 mEq/L   CO2 35 (*) 19 - 32 mEq/L   Glucose, Bld 122 (*) 70 - 99 mg/dL   BUN 28 (*) 6 - 23 mg/dL   Creatinine, Ser 1.61 (*) 0.50 - 1.35 mg/dL   Calcium 9.3  8.4 - 09.6 mg/dL   Total Protein 7.1  6.0 - 8.3 g/dL   Albumin 3.5  3.5 - 5.2 g/dL   AST 54 (*) 0 - 37 U/L   ALT 40  0 - 53 U/L   Alkaline Phosphatase 71  39 - 117 U/L   Total Bilirubin 0.4  0.3 - 1.2 mg/dL   GFR calc non Af Amer 53 (*) >90 mL/min   GFR calc Af Amer 62 (*) >90 mL/min  CBC      Result Value Range   WBC 9.8  4.0 - 10.5 K/uL   RBC 3.34 (*) 4.22 - 5.81 MIL/uL   Hemoglobin 9.8 (*) 13.0 -  17.0 g/dL   HCT 04.5 (*) 40.9 - 81.1 %   MCV 88.9  78.0 - 100.0 fL   MCH 29.3  26.0 - 34.0 pg   MCHC 33.0  30.0 - 36.0 g/dL   RDW 91.4  78.2 - 95.6 %   Platelets 376  150 - 400 K/uL  PRO B NATRIURETIC PEPTIDE      Result Value Range   Pro B Natriuretic peptide (BNP) 147.6 (*) 0 - 125 pg/mL  URINALYSIS, ROUTINE W REFLEX MICROSCOPIC      Result Value Range   Color, Urine YELLOW  YELLOW   APPearance CLEAR  CLEAR   Specific Gravity, Urine 1.014  1.005 - 1.030   pH 6.0  5.0 - 8.0   Glucose, UA NEGATIVE  NEGATIVE mg/dL   Hgb urine dipstick NEGATIVE  NEGATIVE   Bilirubin Urine NEGATIVE  NEGATIVE   Ketones, ur NEGATIVE  NEGATIVE mg/dL   Protein, ur  NEGATIVE  NEGATIVE mg/dL   Urobilinogen, UA 0.2  0.0 - 1.0 mg/dL   Nitrite NEGATIVE  NEGATIVE   Leukocytes, UA NEGATIVE  NEGATIVE  POCT I-STAT TROPONIN I      Result Value Range   Troponin i, poc 0.01  0.00 - 0.08 ng/mL   Comment 3             Dg Chest 2 View  01/16/2013   CLINICAL DATA:  Shortness of breath and chest pain  EXAM: CHEST  2 VIEW  COMPARISON:  01/01/2013  FINDINGS: Decreased lung volumes. Mild cardiac enlargement. The heart size and mediastinal contours are within normal limits. Both lungs are clear. The visualized skeletal structures are unremarkable.  IMPRESSION: 1. Low lung volumes.  2. Mild cardiac enlargement.   Electronically Signed   By: Signa Kell M.D.   On: 01/16/2013 16:52   Dg Chest Port 1 View  01/01/2013   CLINICAL DATA:  Chest pain  EXAM: PORTABLE CHEST - 1 VIEW  COMPARISON:  05/08/2011  FINDINGS: Low lung volumes with bibasilar atelectasis. Upper normal heart size. Upper lungs clear. No pneumothorax.  IMPRESSION: Bibasilar atelectasis.   Electronically Signed   By: Maryclare Bean M.D.   On: 01/01/2013 18:29   Assessment: Nonspecific weakness with improving Hb, low Potassium and relative hypotension. No evidence for ongoing myocardial ischemia. Doubt impending MI, or active GI bleeding. Situational anxiety exists d/t  his dis-satisfaction with his living setting.  He is stable for d/c.   Medical screening examination/treatment/procedure(s) were conducted as a shared visit with non-physician practitioner(s) and myself.  I personally evaluated the patient during the encounter  Flint Melter, MD 01/18/13 1341

## 2013-01-16 NOTE — Progress Notes (Signed)
Spoke with pt re: d/c plans.  Pt AAO x 4.  Pt doesn't want to return to [facility] because he has not been satisfied with his care there.  Pt wants to return home to his apartment this pm, with family checking in on him in the am. Pt feels comfortable taking a cab home to his residence.  Pt also shared some details of his personal life, including a current custody battle with his ex-wife and the stressors re: having a father with dementia and mother in an ALF.  Emotional support offered.  Pt is agreeable to calling PCP to arrange HHC.  [facility] notified.

## 2013-01-16 NOTE — ED Provider Notes (Signed)
  Face-to-face evaluation   History: Patient complains of weakness, worse with ambulation, concern for low blood pressure, and chest pain. The weakness has been present since he was transferred from the hospital to a rehabilitation facility; about 10 days ago.  The chest pain started today at 10 AM, and has persisted, but waxed and waned during the several hours since it started. The pain has continued as a 1900 hours. He does not have chronic chest pain. He is unsure what his cardiac history is. He has diabetes, hypertension, and hypercholesterolemia. His medical history includes documentation of myocardial infarction and coronary artery disease.   8:57 PM Reevaluation with update and discussion. After initial assessment and treatment, an updated evaluation reveals he is now sitting up on the edge of the heart, cooperative, in no apparent distress. He relates problems, that he's been having at his skilled nursing facility. He has been talking to the administrator and a Child psychotherapist there, about being transferred to a different facility. He has an appointment with his cardiologist, Dr. Nadara Eaton., tomorrow. Findings were discussed with him, and all questions answered. Rondle Lohse L   Physical exam: Obese, alert, cooperative. Lungs clear. Extremities 2+ edema peripherally in lower legs.  Assessment- his evaluation is consistent with volume depletion, secondary to diuresis. His weight today is 182 kg. Comparing this to his weight on 01/02/2013 192.5,  he is 10.2 kg. less today. His hemoglobin is improved by 1.3 g since 01/05/2013. His BUN is mildly elevated with a creatinine that is stable. His lowered potassium is secondary to the diuresis. He is stable for discharge. I doubt ACS, PE, MI, significant metabolic instability.  Plan: Hold Lasix for 36 hours and restart on reduced dose of 40 mg twice a day. Encourage rest and care with standing and walking. He has an outpatient followup with his cardiologist  tomorrow, which he is encouraged to go to.   Medical screening examination/treatment/procedure(s) were conducted as a shared visit with non-physician practitioner(s) and myself.  I personally evaluated the patient during the encounter  Flint Melter, MD 01/16/13 2107

## 2013-01-16 NOTE — ED Provider Notes (Signed)
CSN: [id number]     Arrival date & time 01/16/13  1528 History   First MD Initiated Contact with Patient 01/16/13 1534     Chief Complaint  Patient presents with  . Chest Pain   (Consider location/radiation/quality/duration/timing/severity/associated sxs/prior Treatment) HPI Comments: Patient is a 62 yo M PMHx significant for CHF, HTN, Angina, Depression, Hepatitis B, DM presenting to the ED for three days of gradually worsening central chest pressure w/ radiation to left side of neck w/ associated SOB and fatigue. Patient rates his pain currently at a 4/10 w/ no aggravating factors. He states his pain was alleviated by the 324mg  ASA and 2 SL NLG tablets received in the ambulance. Patient states he feels as though "my arteries are clogging." Patient continuously expressing his frustration with the rehab facility he is in because "no one cared that I needed help for three days." Patient was discharged home from the hospital 10/21 for upper GI bleed after having an endoscopy w/o distinct reason for upper GI appreciated. Patient is followed by Dr. Jacinto Halim for cardiology. Last stress test and echo was 1 1/2 years ago. Due to see Dr. Jacinto Halim tomorrow.    Past Medical History  Diagnosis Date  . CHF (congestive heart failure)   . Hypertension   . Anemia   . Sleep apnea, obstructive     does where bipap  . Angina   . Shortness of breath   . Anxiety   . Blood transfusion   . Headache(784.0)   . Pneumonia   . Depression   . Hepatitis Hep B  . Neuromuscular disorder DJD  . Arthritis   . Diabetes mellitus 09/18/2011   Past Surgical History  Procedure Laterality Date  . Back surgery    . Colon surgery      Gastric [phone]; Reversed in 1992 and revision 1994  . Diagnostic laparoscopy    . Esophagoscopy N/A 01/01/2013    Procedure: ESOPHAGOSCOPY;  Surgeon: Petra Kuba, MD;  Location: [facility] OR;  Service: Endoscopy;  Laterality: N/A;  . Esophagogastroduodenoscopy N/A 01/03/2013    Procedure:  ESOPHAGOGASTRODUODENOSCOPY (EGD);  Surgeon: Willis Modena, MD;  Location: [facility] ORS;  Service: Endoscopy;  Laterality: N/A;  . Esophagogastroduodenoscopy N/A 01/03/2013    Procedure: ESOPHAGOGASTRODUODENOSCOPY (EGD);  Surgeon: Willis Modena, MD;  Location: [facility];  Service: Endoscopy;  Laterality: N/A;   Family History  Problem Relation Age of Onset  . Heart disease Father   . Skin cancer Father    History  Substance Use Topics  . Smoking status: Never Smoker   . Smokeless tobacco: Not on file  . Alcohol Use: No    Review of Systems  Constitutional: Positive for fatigue. Negative for fever.  Respiratory: Positive for shortness of breath. Negative for cough.   Cardiovascular: Positive for chest pain. Negative for leg swelling.  Gastrointestinal: Negative for nausea, vomiting and abdominal pain.  Neurological: Positive for light-headedness.  All other systems reviewed and are negative.    Allergies  Adhesive; Feldene; Latex; and Morphine and related  Home Medications   Current Outpatient Rx  Name  Route  Sig  Dispense  Refill  . amLODipine (NORVASC) 10 MG tablet   Oral   Take 10 mg by mouth daily.         Marland Kitchen atorvastatin (LIPITOR) 10 MG tablet   Oral   Take 10 mg by mouth daily.         Marland Kitchen desvenlafaxine (PRISTIQ) 50 MG 24 hr tablet   Oral  Take 50 mg by mouth daily.         . furosemide (LASIX) 40 MG tablet   Oral   Take by mouth 2 (two) times daily. 1 1/2 tabs         . gabapentin (NEURONTIN) 800 MG tablet   Oral   Take 800-1,600 mg by mouth 3 (three) times daily. 1600mg  in the morning, 800mg  in the afternoon, and 1600mg  at night.         . insulin aspart (NOVOLOG) 100 UNIT/ML injection      0-15 Units, Subcutaneous, 3 times daily with meals CBG < 70: implement hypoglycemia protocol CBG 70 - 120: 0 units CBG 121 - 150: 2 units CBG 151 - 200: 3 units CBG 201 - 250: 5 units CBG 251 - 300: 8 units CBG 301 - 350: 11 units CBG 351 - 400: 15  units CBG > 400   1 vial   12   . insulin glargine (LANTUS) 100 UNIT/ML injection   Subcutaneous   Inject 0.1 mLs (10 Units total) into the skin at bedtime.   10 mL   12   . losartan (COZAAR) 100 MG tablet   Oral   Take 100 mg by mouth daily.         . methadone (DOLOPHINE) 10 MG tablet   Oral   Take 1 tablet (10 mg total) by mouth 3 (three) times daily.   30 tablet   0   . metolazone (ZAROXOLYN) 5 MG tablet   Oral   Take 5 mg by mouth daily.         . metoprolol (LOPRESSOR) 50 MG tablet   Oral   Take 25 mg by mouth 2 (two) times daily.         . Multiple Vitamin (MULITIVITAMIN WITH MINERALS) TABS   Oral   Take 1 tablet by mouth daily.         . pantoprazole (PROTONIX) 40 MG tablet   Oral   Take 1 tablet (40 mg total) by mouth daily.         . polycarbophil (FIBERCON) 625 MG tablet   Oral   Take 625 mg by mouth daily.         . polyvinyl alcohol (LIQUIFILM TEARS) 1.4 % ophthalmic solution   Both Eyes   Place 1 drop into both eyes daily as needed. For dry eyes         . Testosterone (ANDROGEL PUMP) 1.25 GM/ACT (1%) GEL   Transdermal   Place 1 application onto the skin daily.         . traZODone (DESYREL) 100 MG tablet   Oral   Take 300 mg by mouth at bedtime.          Marland Kitchen venlafaxine (EFFEXOR) 75 MG tablet   Oral   Take 75 mg by mouth 2 (two) times daily.         . nitroGLYCERIN (NITROSTAT) 0.4 MG SL tablet   Sublingual   Place 0.4 mg under the tongue every 5 (five) minutes as needed. For chest pain          BP 108/58  Pulse 81  Temp(Src) 98.6 F (37 C) (Oral)  Resp 15  Ht 5\' 11"  (1.803 m)  Wt 390 lb (176.903 kg)  BMI 54.42 kg/m2  SpO2 95% Physical Exam  Constitutional: He is oriented to person, place, and time. He appears well-developed and well-nourished. No distress.  Talking in complete sentences without difficulty.  HENT:  Head: Normocephalic and atraumatic.  Right Ear: External ear normal.  Left Ear: External ear  normal.  Nose: Nose normal.  Mouth/Throat: Oropharynx is clear and moist.  Eyes: Conjunctivae are normal.  Neck: Normal range of motion and full passive range of motion without pain. Neck supple. Carotid bruit is not present.  Cardiovascular: Normal rate, regular rhythm, normal heart sounds and intact distal pulses.   Pulmonary/Chest: Effort normal and breath sounds normal. No respiratory distress. He exhibits no tenderness.  Abdominal: Soft. Bowel sounds are normal. He exhibits no distension. There is no tenderness. There is no rebound and no guarding.  Musculoskeletal: He exhibits edema (1+ bilateral).  Neurological: He is alert and oriented to person, place, and time.  Skin: Skin is warm and dry. He is not diaphoretic.    ED Course  Procedures (including critical care time) Medications  nitroGLYCERIN (NITROSTAT) SL tablet 0.4 mg (not administered)    Labs Review Labs Reviewed  COMPREHENSIVE METABOLIC PANEL - Abnormal; Notable for the following:    Sodium 132 (*)    Potassium 2.7 (*)    Chloride 85 (*)    CO2 35 (*)    Glucose, Bld 122 (*)    BUN 28 (*)    Creatinine, Ser 1.39 (*)    AST 54 (*)    GFR calc non Af Amer 53 (*)    GFR calc Af Amer 62 (*)    All other components within normal limits  CBC - Abnormal; Notable for the following:    RBC 3.34 (*)    Hemoglobin 9.8 (*)    HCT 29.7 (*)    All other components within normal limits  PRO B NATRIURETIC PEPTIDE - Abnormal; Notable for the following:    Pro B Natriuretic peptide (BNP) 147.6 (*)    All other components within normal limits  URINALYSIS, ROUTINE W REFLEX MICROSCOPIC  POCT I-STAT TROPONIN I   Imaging Review Dg Chest 2 View  01/16/2013   CLINICAL DATA:  Shortness of breath and chest pain  EXAM: CHEST  2 VIEW  COMPARISON:  01/01/2013  FINDINGS: Decreased lung volumes. Mild cardiac enlargement. The heart size and mediastinal contours are within normal limits. Both lungs are clear. The visualized skeletal  structures are unremarkable.  IMPRESSION: 1. Low lung volumes.  2. Mild cardiac enlargement.   Electronically Signed   By: Signa Kell M.D.   On: 01/16/2013 16:52    EKG Interpretation     Ventricular Rate:  93 PR Interval:  183 QRS Duration: 111 QT Interval:  440 QTC Calculation: 547 R Axis:   -169 Text Interpretation:  Sinus or ectopic atrial rhythm Consider right ventricular hypertrophy Abnormal T, consider ischemia, lateral leads Prolonged QT interval Baseline wander in lead(s) V3 No significant change was found            MDM   1. Chest pain     Afebrile, NAD, non-toxic appearing, AAOx4. I have reviewed nursing notes, vital signs, and all appropriate lab and imaging results for this patient. Patient care will be transferred to dr. Effie Shy w/ disposition pending.        Jeannetta Ellis, PA-C 01/16/13 1816

## 2013-01-16 NOTE — ED Notes (Addendum)
Pt sts will not go back to rehab facility. Jody social work and Dr. Effie Shy made aware. Attempted to help address pt.s concerns about rehab facility without success. Pt conscious, alert and oriented x4 and appears in right mind. Pt still refusing to go back to facility. Pt informed of dangers and risks. Pt sts family member will check on pt in morning.

## 2013-01-16 NOTE — ED Notes (Signed)
Per EMS pt from [facility] care rehab x 1 week. Called out for chest pain 4/10 that radiates up to neck stated at rest.  Has given 324 ASA and 2 nitro with some improvement. Denies n/v endorses dizziness.

## 2013-01-23 ENCOUNTER — Encounter (HOSPITAL_COMMUNITY): Payer: Self-pay | Admitting: Internal Medicine

## 2013-01-23 ENCOUNTER — Inpatient Hospital Stay (HOSPITAL_COMMUNITY)
Admission: EM | Admit: 2013-01-23 | Discharge: 2013-01-30 | DRG: 378 | Disposition: A | Discharge status: Home Health | Payer: Medicare Other | Attending: Internal Medicine | Admitting: Internal Medicine

## 2013-01-23 ENCOUNTER — Inpatient Hospital Stay (HOSPITAL_COMMUNITY): Payer: Medicare Other

## 2013-01-23 ENCOUNTER — Encounter (HOSPITAL_COMMUNITY)
Admission: EM | Disposition: A | Discharge status: Home Health | Payer: Self-pay | Source: Home / Self Care | Attending: Internal Medicine

## 2013-01-23 ENCOUNTER — Emergency Department (HOSPITAL_COMMUNITY): Payer: Medicare Other

## 2013-01-23 DIAGNOSIS — K922 Gastrointestinal hemorrhage, unspecified: Secondary | ICD-10-CM

## 2013-01-23 DIAGNOSIS — E876 Hypokalemia: Secondary | ICD-10-CM

## 2013-01-23 DIAGNOSIS — R55 Syncope and collapse: Secondary | ICD-10-CM

## 2013-01-23 DIAGNOSIS — I4891 Unspecified atrial fibrillation: Secondary | ICD-10-CM | POA: Diagnosis not present

## 2013-01-23 DIAGNOSIS — Z79899 Other long term (current) drug therapy: Secondary | ICD-10-CM

## 2013-01-23 DIAGNOSIS — E119 Type 2 diabetes mellitus without complications: Secondary | ICD-10-CM | POA: Diagnosis present

## 2013-01-23 DIAGNOSIS — I509 Heart failure, unspecified: Secondary | ICD-10-CM | POA: Diagnosis present

## 2013-01-23 DIAGNOSIS — R5381 Other malaise: Secondary | ICD-10-CM | POA: Diagnosis present

## 2013-01-23 DIAGNOSIS — F3289 Other specified depressive episodes: Secondary | ICD-10-CM | POA: Diagnosis present

## 2013-01-23 DIAGNOSIS — D62 Acute posthemorrhagic anemia: Secondary | ICD-10-CM | POA: Diagnosis present

## 2013-01-23 DIAGNOSIS — E785 Hyperlipidemia, unspecified: Secondary | ICD-10-CM | POA: Diagnosis present

## 2013-01-23 DIAGNOSIS — I10 Essential (primary) hypertension: Secondary | ICD-10-CM

## 2013-01-23 DIAGNOSIS — I959 Hypotension, unspecified: Secondary | ICD-10-CM | POA: Diagnosis present

## 2013-01-23 DIAGNOSIS — Z9884 Bariatric surgery status: Secondary | ICD-10-CM

## 2013-01-23 DIAGNOSIS — D649 Anemia, unspecified: Secondary | ICD-10-CM

## 2013-01-23 DIAGNOSIS — K219 Gastro-esophageal reflux disease without esophagitis: Secondary | ICD-10-CM | POA: Diagnosis present

## 2013-01-23 DIAGNOSIS — E7800 Pure hypercholesterolemia, unspecified: Secondary | ICD-10-CM

## 2013-01-23 DIAGNOSIS — G8929 Other chronic pain: Secondary | ICD-10-CM | POA: Diagnosis present

## 2013-01-23 DIAGNOSIS — K921 Melena: Principal | ICD-10-CM | POA: Diagnosis present

## 2013-01-23 DIAGNOSIS — G4733 Obstructive sleep apnea (adult) (pediatric): Secondary | ICD-10-CM | POA: Diagnosis present

## 2013-01-23 DIAGNOSIS — F329 Major depressive disorder, single episode, unspecified: Secondary | ICD-10-CM | POA: Diagnosis present

## 2013-01-23 DIAGNOSIS — F411 Generalized anxiety disorder: Secondary | ICD-10-CM | POA: Diagnosis present

## 2013-01-23 DIAGNOSIS — Z6841 Body Mass Index (BMI) 40.0 and over, adult: Secondary | ICD-10-CM

## 2013-01-23 DIAGNOSIS — Z794 Long term (current) use of insulin: Secondary | ICD-10-CM

## 2013-01-23 DIAGNOSIS — K259 Gastric ulcer, unspecified as acute or chronic, without hemorrhage or perforation: Secondary | ICD-10-CM | POA: Diagnosis present

## 2013-01-23 HISTORY — DX: Unspecified atrial fibrillation: I48.91

## 2013-01-23 HISTORY — PX: ESOPHAGOGASTRODUODENOSCOPY: SHX5428

## 2013-01-23 LAB — CBC
HCT: 21.4 % — ABNORMAL LOW (ref 39.0–52.0)
HCT: 23.8 % — ABNORMAL LOW (ref 39.0–52.0)
Hemoglobin: 7.1 g/dL — ABNORMAL LOW (ref 13.0–17.0)
Hemoglobin: 7.6 g/dL — ABNORMAL LOW (ref 13.0–17.0)
MCH: 29.1 pg (ref 26.0–34.0)
MCH: 28.6 pg (ref 26.0–34.0)
MCHC: 33.2 g/dL (ref 30.0–36.0)
MCHC: 31.9 g/dL (ref 30.0–36.0)
MCV: 87.7 fL (ref 78.0–100.0)
MCV: 89.5 fL (ref 78.0–100.0)
Platelets: 254 10*3/uL (ref 150–400)
RBC: 2.44 MIL/uL — ABNORMAL LOW (ref 4.22–5.81)
RBC: 2.66 MIL/uL — ABNORMAL LOW (ref 4.22–5.81)
RDW: 16 % — ABNORMAL HIGH (ref 11.5–15.5)
WBC: 10.8 10*3/uL — ABNORMAL HIGH (ref 4.0–10.5)

## 2013-01-23 LAB — CBC WITH DIFFERENTIAL/PLATELET
Basophils Absolute: 0 10*3/uL (ref 0.0–0.1)
Basophils Relative: 0 % (ref 0–1)
Eosinophils Absolute: 0.1 10*3/uL (ref 0.0–0.7)
Eosinophils Relative: 1 % (ref 0–5)
HCT: 20.5 % — ABNORMAL LOW (ref 39.0–52.0)
MCH: 28.9 pg (ref 26.0–34.0)
MCHC: 31.7 g/dL (ref 30.0–36.0)
MCV: 91.1 fL (ref 78.0–100.0)
Monocytes Absolute: 0.5 10*3/uL (ref 0.1–1.0)
Monocytes Relative: 4 % (ref 3–12)
Neutro Abs: 9.5 10*3/uL — ABNORMAL HIGH (ref 1.7–7.7)
Platelets: 353 10*3/uL (ref 150–400)
RDW: 15.5 % (ref 11.5–15.5)

## 2013-01-23 LAB — URINALYSIS, ROUTINE W REFLEX MICROSCOPIC
Glucose, UA: NEGATIVE mg/dL
Ketones, ur: 15 mg/dL — AB
Leukocytes, UA: NEGATIVE
Nitrite: NEGATIVE
Protein, ur: NEGATIVE mg/dL
pH: 5 (ref 5.0–8.0)

## 2013-01-23 LAB — LIPASE, BLOOD: Lipase: 25 U/L (ref 11–59)

## 2013-01-23 LAB — COMPREHENSIVE METABOLIC PANEL
ALT: 22 U/L (ref 0–53)
AST: 27 U/L (ref 0–37)
AST: 25 U/L (ref 0–37)
Albumin: 2.6 g/dL — ABNORMAL LOW (ref 3.5–5.2)
Albumin: 2.5 g/dL — ABNORMAL LOW (ref 3.5–5.2)
BUN: 35 mg/dL — ABNORMAL HIGH (ref 6–23)
CO2: 30 mEq/L (ref 19–32)
Calcium: 8.3 mg/dL — ABNORMAL LOW (ref 8.4–10.5)
Chloride: 98 mEq/L (ref 96–112)
Chloride: 105 mEq/L (ref 96–112)
Creatinine, Ser: 0.92 mg/dL (ref 0.50–1.35)
Creatinine, Ser: 0.93 mg/dL (ref 0.50–1.35)
GFR calc non Af Amer: 89 mL/min — ABNORMAL LOW (ref >90)
Glucose, Bld: 200 mg/dL — ABNORMAL HIGH (ref 70–99)
Sodium: 138 mEq/L (ref 135–145)
Sodium: 142 mEq/L (ref 135–145)
Total Bilirubin: 0.3 mg/dL (ref 0.3–1.2)
Total Protein: 5.5 g/dL — ABNORMAL LOW (ref 6.0–8.3)

## 2013-01-23 LAB — PROTIME-INR
INR: 1.16 (ref 0.00–1.49)
Prothrombin Time: 14.6 seconds (ref 11.6–15.2)

## 2013-01-23 LAB — PREPARE RBC (CROSSMATCH)

## 2013-01-23 LAB — POCT I-STAT TROPONIN I

## 2013-01-23 LAB — GLUCOSE, CAPILLARY

## 2013-01-23 LAB — MRSA PCR SCREENING: MRSA by PCR: NEGATIVE

## 2013-01-23 SURGERY — EGD (ESOPHAGOGASTRODUODENOSCOPY)
Anesthesia: General

## 2013-01-23 MED ORDER — SODIUM CHLORIDE 0.9 % IJ SOLN
3.0000 mL | Freq: Two times a day (BID) | INTRAMUSCULAR | Status: DC
  Administered 2013-01-23 – 2013-01-25 (×4): 3 mL via INTRAVENOUS

## 2013-01-23 MED ORDER — PANTOPRAZOLE SODIUM 40 MG IV SOLR
40.0000 mg | Freq: Two times a day (BID) | INTRAVENOUS | Status: DC
  Filled 2013-01-23: qty 40

## 2013-01-23 MED ORDER — SODIUM CHLORIDE 0.9 % IV BOLUS (SEPSIS)
500.0000 mL | Freq: Once | INTRAVENOUS | Status: AC
  Administered 2013-01-23: 500 mL via INTRAVENOUS

## 2013-01-23 MED ORDER — DIPHENHYDRAMINE HCL 50 MG/ML IJ SOLN
INTRAMUSCULAR | Status: AC
  Filled 2013-01-23: qty 1

## 2013-01-23 MED ORDER — MORPHINE SULFATE 2 MG/ML IJ SOLN
INTRAMUSCULAR | Status: AC
  Administered 2013-01-23: 2 mg via INTRAVENOUS
  Filled 2013-01-23: qty 1

## 2013-01-23 MED ORDER — ACETAMINOPHEN 325 MG PO TABS
650.0000 mg | ORAL_TABLET | Freq: Four times a day (QID) | ORAL | Status: DC | PRN
  Administered 2013-01-23 – 2013-01-24 (×2): 650 mg via ORAL
  Filled 2013-01-23 (×2): qty 2

## 2013-01-23 MED ORDER — DIPHENHYDRAMINE HCL 50 MG/ML IJ SOLN
INTRAMUSCULAR | Status: DC | PRN
  Administered 2013-01-23: 25 mg via INTRAVENOUS

## 2013-01-23 MED ORDER — SODIUM CHLORIDE 0.9 % IV SOLN
80.0000 mg | Freq: Once | INTRAVENOUS | Status: DC
  Filled 2013-01-23: qty 80

## 2013-01-23 MED ORDER — MIDAZOLAM HCL 5 MG/ML IJ SOLN
INTRAMUSCULAR | Status: AC
  Filled 2013-01-23: qty 2

## 2013-01-23 MED ORDER — SODIUM CHLORIDE 0.9 % IV SOLN
INTRAVENOUS | Status: DC
  Administered 2013-01-23: 13:00:00 via INTRAVENOUS

## 2013-01-23 MED ORDER — MIDAZOLAM HCL 10 MG/2ML IJ SOLN
INTRAMUSCULAR | Status: DC | PRN
  Administered 2013-01-23: 1 mg via INTRAVENOUS
  Administered 2013-01-23 (×4): 2 mg via INTRAVENOUS

## 2013-01-23 MED ORDER — MORPHINE SULFATE 2 MG/ML IJ SOLN: 1.0000 mg | INTRAMUSCULAR | Status: DC | PRN

## 2013-01-23 MED ORDER — ACETAMINOPHEN 650 MG RE SUPP: 650.0000 mg | Freq: Four times a day (QID) | RECTAL | Status: DC | PRN

## 2013-01-23 MED ORDER — ONDANSETRON HCL 4 MG/2ML IJ SOLN
4.0000 mg | Freq: Once | INTRAMUSCULAR | Status: AC
  Administered 2013-01-23: 4 mg via INTRAVENOUS

## 2013-01-23 MED ORDER — BUTAMBEN-TETRACAINE-BENZOCAINE 2-2-14 % EX AERO
INHALATION_SPRAY | CUTANEOUS | Status: DC | PRN
  Administered 2013-01-23: 2 via TOPICAL

## 2013-01-23 MED ORDER — MORPHINE SULFATE 2 MG/ML IJ SOLN
2.0000 mg | Freq: Once | INTRAMUSCULAR | Status: AC
  Administered 2013-01-23: 2 mg via INTRAVENOUS

## 2013-01-23 MED ORDER — SODIUM CHLORIDE 0.9 % IV SOLN
8.0000 mg/h | INTRAVENOUS | Status: AC
  Administered 2013-01-23 – 2013-01-25 (×5): 8 mg/h via INTRAVENOUS
  Filled 2013-01-23 (×13): qty 80

## 2013-01-23 MED ORDER — ONDANSETRON HCL 4 MG PO TABS: 4.0000 mg | ORAL_TABLET | Freq: Four times a day (QID) | ORAL | Status: DC | PRN

## 2013-01-23 MED ORDER — ONDANSETRON HCL 4 MG/2ML IJ SOLN: 4.0000 mg | Freq: Four times a day (QID) | INTRAMUSCULAR | Status: DC | PRN

## 2013-01-23 MED ORDER — INSULIN ASPART 100 UNIT/ML ~~LOC~~ SOLN
0.0000 [IU] | SUBCUTANEOUS | Status: DC
  Administered 2013-01-23 (×3): 2 [IU] via SUBCUTANEOUS
  Administered 2013-01-24: 1 [IU] via SUBCUTANEOUS
  Administered 2013-01-24: 2 [IU] via SUBCUTANEOUS
  Administered 2013-01-24: 1 [IU] via SUBCUTANEOUS
  Administered 2013-01-24 (×2): 2 [IU] via SUBCUTANEOUS
  Administered 2013-01-25: 1 [IU] via SUBCUTANEOUS
  Administered 2013-01-25: 2 [IU] via SUBCUTANEOUS

## 2013-01-23 MED ORDER — SODIUM CHLORIDE 0.9 % IV SOLN
80.0000 mg | Freq: Once | INTRAVENOUS | Status: DC
  Administered 2013-01-23: 80 mg via INTRAVENOUS
  Filled 2013-01-23: qty 80

## 2013-01-23 MED ORDER — FENTANYL CITRATE 0.05 MG/ML IJ SOLN
INTRAMUSCULAR | Status: AC
  Filled 2013-01-23: qty 2

## 2013-01-23 MED ORDER — PANTOPRAZOLE SODIUM 40 MG IV SOLR
40.0000 mg | Freq: Once | INTRAVENOUS | Status: AC
  Administered 2013-01-23: 40 mg via INTRAVENOUS

## 2013-01-23 MED ORDER — ONDANSETRON HCL 4 MG/2ML IJ SOLN
INTRAMUSCULAR | Status: AC
  Filled 2013-01-23: qty 2

## 2013-01-23 MED ORDER — MORPHINE SULFATE 2 MG/ML IJ SOLN: 2.0000 mg | Freq: Once | INTRAMUSCULAR | Status: DC

## 2013-01-23 MED ORDER — INSULIN ASPART 100 UNIT/ML ~~LOC~~ SOLN
0.0000 [IU] | Freq: Three times a day (TID) | SUBCUTANEOUS | Status: DC
  Administered 2013-01-23: 3 [IU] via SUBCUTANEOUS

## 2013-01-23 MED ORDER — SODIUM CHLORIDE 0.9 % IV SOLN
INTRAVENOUS | Status: AC
  Administered 2013-01-23 (×2): via INTRAVENOUS

## 2013-01-23 MED ORDER — FENTANYL CITRATE 0.05 MG/ML IJ SOLN
INTRAMUSCULAR | Status: DC | PRN
Start: 2013-01-23 — End: 2013-01-23
  Administered 2013-01-23 (×2): 25 ug via INTRAVENOUS

## 2013-01-23 MED ORDER — MORPHINE SULFATE 4 MG/ML IJ SOLN: 4.0000 mg | Freq: Once | INTRAMUSCULAR | Status: DC

## 2013-01-23 NOTE — ED Notes (Signed)
Irrigate remaining clear in NG tube, reports to Dr. Arnoldo Morale of same and advised to pull NG tube.

## 2013-01-23 NOTE — ED Notes (Signed)
Pt arrived from home via [facility] c/o upper abdominal pain, weakness. Pt went to his knees and busted his lip. EMS states the pt had been constipated several days, and he took medication. When he had a bowel movement it came out dark and tarry. EMS VS BP 123/64, HR 100, CBG 213, 12 lead WNL

## 2013-01-23 NOTE — ED Notes (Signed)
Patient transported to X-ray 

## 2013-01-23 NOTE — Consult Note (Signed)
Reason for Consult: Upper GI bleed Referring Physician: Dr. Charlott Rakes   HPI: Phillip Maldonado is a 62 year old male with a history of obesity, HTN, DM, OSA, CHF, gastric surgery in 1977 and 1982, multiple back surgeries who was admitted 01/01/13-01/05/13 with an upper gi bleed.  At this time, he underwent an EGD on 10/21 which showed clots, but no definite source of bleeding.  His hemoglobin stabilized and he was subsequently discharged to rehab.  He reports that on Sunday night he had several large black bowel movements on Sunday night.  He reports that he was taken off ASA last discharge, he was taking 1000mg  of ibuprofen daily which he stopped 1 month ago.  He was brought to the ED via EMS.  He was found to be hypotensive with a hgb 6.5.  He underwent another EGD this morning which revealed a large black blood clot, blue suture seen on previous EGDs, small amount of blood in distal stomach, concerns for ulcer in proximal ulcer.  We were therefore consulted.  At present time, his blood pressure is stable.  His is mildly tachycardic.  He has received 2 units of PRBCs, on IV fluids and Protonix drip.  He complains of weakness and fatigue.  Reports a poor appetite over the last 3-4 days.  He denies chest pains, shortness of breath or dizziness.  He reports several bloody BMs today.    Past Medical History  Diagnosis Date  . CHF (congestive heart failure)   . Hypertension   . Anemia   . Sleep apnea, obstructive     does where bipap  . Angina   . Shortness of breath   . Anxiety   . Blood transfusion   . Headache(784.0)   . Pneumonia   . Depression   . Hepatitis Hep B  . Neuromuscular disorder DJD  . Arthritis   . Diabetes mellitus 09/18/2011    Past Surgical History  Procedure Laterality Date  . Back surgery    . Stomach surgery      Gastric [id number]; Reversed in 1992 and revision 1994  . Diagnostic laparoscopy    . Esophagoscopy N/A 01/01/2013    Procedure: ESOPHAGOSCOPY;   Surgeon: Petra Kuba, MD;  Location: [facility] OR;  Service: Endoscopy;  Laterality: N/A;  . Esophagogastroduodenoscopy N/A 01/03/2013    Procedure: ESOPHAGOGASTRODUODENOSCOPY (EGD);  Surgeon: Willis Modena, MD;  Location: [facility] ORS;  Service: Endoscopy;  Laterality: N/A;  . Esophagogastroduodenoscopy N/A 01/03/2013    Procedure: ESOPHAGOGASTRODUODENOSCOPY (EGD);  Surgeon: Willis Modena, MD;  Location: [facility];  Service: Endoscopy;  Laterality: N/A;    Family History  Problem Relation Age of Onset  . Heart disease Father   . Skin cancer Father     Social History:  reports that he has never smoked. He does not have any smokeless tobacco history on file. He reports that he does not drink alcohol or use illicit drugs.  Allergies:  Allergies  Allergen Reactions  . Adhesive [Tape] Other (See Comments)    unknown  . Feldene [Piroxicam] Other (See Comments)    unknown  . Latex     Rash and itching  . Morphine And Related Other (See Comments)    unknown    Medications: medication reviewed  Results for orders placed during the hospital encounter of 01/23/13 (from the past 48 hour(s))  CBC WITH DIFFERENTIAL     Status: Abnormal   Collection Time    01/23/13  2:05 AM  Result Value Range   WBC 13.4 (*) 4.0 - 10.5 K/uL   RBC 2.25 (*) 4.22 - 5.81 MIL/uL   Hemoglobin 6.5 (*) 13.0 - 17.0 g/dL   Comment: CRITICAL RESULT CALLED TO, READ BACK BY AND VERIFIED WITH:     COOK,J RN 01/23/2013 0213 JORDANS     REPEATED TO VERIFY   HCT 20.5 (*) 39.0 - 52.0 %   MCV 91.1  78.0 - 100.0 fL   MCH 28.9  26.0 - 34.0 pg   MCHC 31.7  30.0 - 36.0 g/dL   RDW 98.1  19.1 - 47.8 %   Platelets 353  150 - 400 K/uL   Neutrophils Relative % 71  43 - 77 %   Neutro Abs 9.5 (*) 1.7 - 7.7 K/uL   Lymphocytes Relative 24  12 - 46 %   Lymphs Abs 3.3  0.7 - 4.0 K/uL   Monocytes Relative 4  3 - 12 %   Monocytes Absolute 0.5  0.1 - 1.0 K/uL   Eosinophils Relative 1  0 - 5 %   Eosinophils Absolute 0.1  0.0 - 0.7  K/uL   Basophils Relative 0  0 - 1 %   Basophils Absolute 0.0  0.0 - 0.1 K/uL  COMPREHENSIVE METABOLIC PANEL     Status: Abnormal   Collection Time    01/23/13  2:05 AM      Result Value Range   Sodium 138  135 - 145 mEq/L   Potassium 4.1  3.5 - 5.1 mEq/L   Chloride 98  96 - 112 mEq/L   CO2 25  19 - 32 mEq/L   Glucose, Bld 268 (*) 70 - 99 mg/dL   BUN 35 (*) 6 - 23 mg/dL   Creatinine, Ser 2.95  0.50 - 1.35 mg/dL   Calcium 8.3 (*) 8.4 - 10.5 mg/dL   Total Protein 5.6 (*) 6.0 - 8.3 g/dL   Albumin 2.6 (*) 3.5 - 5.2 g/dL   AST 27  0 - 37 U/L   ALT 24  0 - 53 U/L   Alkaline Phosphatase 55  39 - 117 U/L   Total Bilirubin 0.3  0.3 - 1.2 mg/dL   GFR calc non Af Amer 89 (*) >90 mL/min   GFR calc Af Amer >90  >90 mL/min   Comment: (NOTE)     The eGFR has been calculated using the CKD EPI equation.     This calculation has not been validated in all clinical situations.     eGFR's persistently <90 mL/min signify possible Chronic Kidney     Disease.  LIPASE, BLOOD     Status: None   Collection Time    01/23/13  2:05 AM      Result Value Range   Lipase 25  11 - 59 U/L  OCCULT BLOOD, POC DEVICE     Status: Abnormal   Collection Time    01/23/13  2:25 AM      Result Value Range   Fecal Occult Bld POSITIVE (*) NEGATIVE  TYPE AND SCREEN     Status: None   Collection Time    01/23/13  2:35 AM      Result Value Range   ABO/RH(D) O POS     Antibody Screen NEG     Sample Expiration 01/26/2013     Unit Number [id number]     Blood Component Type RBC LR PHER1     Unit division 00     Status of Unit ALLOCATED  Transfusion Status OK TO TRANSFUSE     Crossmatch Result Compatible     Unit Number [id number]     Blood Component Type RED CELLS,LR     Unit division 00     Status of Unit ISSUED     Transfusion Status OK TO TRANSFUSE     Crossmatch Result Compatible     Unit Number [id number]     Blood Component Type RBC LR PHER1     Unit division 00     Status of Unit ALLOCATED      Transfusion Status OK TO TRANSFUSE     Crossmatch Result Compatible     Unit Number [id number]     Blood Component Type RBC LR PHER2     Unit division 00     Status of Unit ISSUED     Transfusion Status OK TO TRANSFUSE     Crossmatch Result Compatible    PROTIME-INR     Status: None   Collection Time    01/23/13  2:47 AM      Result Value Range   Prothrombin Time 14.6  11.6 - 15.2 seconds   INR 1.16  0.00 - 1.49  APTT     Status: Abnormal   Collection Time    01/23/13  2:47 AM      Result Value Range   aPTT 23 (*) 24 - 37 seconds  POCT I-STAT TROPONIN I     Status: None   Collection Time    01/23/13  2:51 AM      Result Value Range   Troponin i, poc 0.01  0.00 - 0.08 ng/mL   Comment 3            Comment: Due to the release kinetics of cTnI,     a negative result within the first hours     of the onset of symptoms does not rule out     myocardial infarction with certainty.     If myocardial infarction is still suspected,     repeat the test at appropriate intervals.  PREPARE RBC (CROSSMATCH)     Status: None   Collection Time    01/23/13  3:00 AM      Result Value Range   Order Confirmation ORDER PROCESSED BY BLOOD BANK    PREPARE RBC (CROSSMATCH)     Status: None   Collection Time    01/23/13  4:00 AM      Result Value Range   Order Confirmation ORDER PROCESSED BY BLOOD BANK    URINALYSIS, ROUTINE W REFLEX MICROSCOPIC     Status: Abnormal   Collection Time    01/23/13  4:24 AM      Result Value Range   Color, Urine YELLOW  YELLOW   APPearance CLEAR  CLEAR   Specific Gravity, Urine 1.020  1.005 - 1.030   pH 5.0  5.0 - 8.0   Glucose, UA NEGATIVE  NEGATIVE mg/dL   Hgb urine dipstick NEGATIVE  NEGATIVE   Bilirubin Urine NEGATIVE  NEGATIVE   Ketones, ur 15 (*) NEGATIVE mg/dL   Protein, ur NEGATIVE  NEGATIVE mg/dL   Urobilinogen, UA 0.2  0.0 - 1.0 mg/dL   Nitrite NEGATIVE  NEGATIVE   Leukocytes, UA NEGATIVE  NEGATIVE   Comment: MICROSCOPIC NOT DONE ON URINES  WITH NEGATIVE PROTEIN, BLOOD, LEUKOCYTES, NITRITE, OR GLUCOSE <1000 mg/dL.  MRSA PCR SCREENING     Status: None   Collection Time    01/23/13  5:51 AM  Result Value Range   MRSA by PCR NEGATIVE  NEGATIVE   Comment:            The GeneXpert MRSA Assay (FDA     approved for NASAL specimens     only), is one component of a     comprehensive MRSA colonization     surveillance program. It is not     intended to diagnose MRSA     infection nor to guide or     monitor treatment for     MRSA infections.  GLUCOSE, CAPILLARY     Status: Abnormal   Collection Time    01/23/13  7:43 AM      Result Value Range   Glucose-Capillary 213 (*) 70 - 99 mg/dL  COMPREHENSIVE METABOLIC PANEL     Status: Abnormal   Collection Time    01/23/13 10:25 AM      Result Value Range   Sodium 142  135 - 145 mEq/L   Potassium 4.1  3.5 - 5.1 mEq/L   Chloride 105  96 - 112 mEq/L   CO2 30  19 - 32 mEq/L   Glucose, Bld 200 (*) 70 - 99 mg/dL   BUN 35 (*) 6 - 23 mg/dL   Creatinine, Ser 1.61  0.50 - 1.35 mg/dL   Calcium 7.8 (*) 8.4 - 10.5 mg/dL   Total Protein 5.5 (*) 6.0 - 8.3 g/dL   Albumin 2.5 (*) 3.5 - 5.2 g/dL   AST 25  0 - 37 U/L   ALT 22  0 - 53 U/L   Alkaline Phosphatase 49  39 - 117 U/L   Total Bilirubin 0.3  0.3 - 1.2 mg/dL   GFR calc non Af Amer 89 (*) >90 mL/min   GFR calc Af Amer >90  >90 mL/min   Comment: (NOTE)     The eGFR has been calculated using the CKD EPI equation.     This calculation has not been validated in all clinical situations.     eGFR's persistently <90 mL/min signify possible Chronic Kidney     Disease.  CBC     Status: Abnormal   Collection Time    01/23/13 10:25 AM      Result Value Range   WBC 10.8 (*) 4.0 - 10.5 K/uL   RBC 2.44 (*) 4.22 - 5.81 MIL/uL   Hemoglobin 7.1 (*) 13.0 - 17.0 g/dL   HCT 09.6 (*) 04.5 - 40.9 %   MCV 87.7  78.0 - 100.0 fL   MCH 29.1  26.0 - 34.0 pg   MCHC 33.2  30.0 - 36.0 g/dL   RDW 81.1  91.4 - 78.2 %   Platelets 254  150 - 400 K/uL    Comment: REPEATED TO VERIFY     DELTA CHECK NOTED  GLUCOSE, CAPILLARY     Status: Abnormal   Collection Time    01/23/13 11:47 AM      Result Value Range   Glucose-Capillary 168 (*) 70 - 99 mg/dL    Dg Abd 1 View  95/62/1308   CLINICAL DATA:  Abdominal pain  EXAM: ABDOMEN - 1 VIEW  COMPARISON:  Of prior radiograph from 05/08/2011  FINDINGS: The visualized bowel gas pattern is nonspecific. No dilated loops of bowel to suggest obstruction or ileus are identified. No soft tissue masses identified. No free intraperitoneal air identified. No abnormal bowel wall thickening appreciated. Several ovoid calcific densities overlying the lateral left abdomen likely lie in the external soft tissues,  stable as compared to the prior exam. Multiple surgical clips overlie the left upper quadrant.  Spinal fixation hardware again noted within the lower spine.  IMPRESSION: Nonobstructive bowel gas pattern with no acute intra-abdominal process identified. .   Electronically Signed   By: Rise Mu M.D.   On: 01/23/2013 05:41   Dg Chest Portable 1 View  01/23/2013   CLINICAL DATA:  Right-sided line placement  EXAM: PORTABLE CHEST - 1 VIEW  COMPARISON:  Prior radiograph from 01/16/2013  FINDINGS: Cardiac and mediastinal silhouettes are unchanged.  Lungs are hypoinflated. No focal infiltrate, pulmonary edema, or pleural effusion is identified. There is no pneumothorax.  Fixation hardware overlying the cervical spine is unchanged. No acute osseous abnormality.  IMPRESSION: 1. Stable appearance of the chest with shallow lung inflation. No pneumothorax status post attempted line placement. 2. No central line identified within the thorax. These results were called by telephone at the time of interpretation on 01/23/2013 at 3:56 AM to Dr.JULIE MANLY , who verbally acknowledged these results.   Electronically Signed   By: Rise Mu M.D.   On: 01/23/2013 03:57    Review of Systems  Constitutional: Positive  for chills and malaise/fatigue. Negative for fever.  Eyes: Negative for blurred vision and double vision.  Respiratory: Negative for shortness of breath.   Cardiovascular: Negative for chest pain, palpitations and leg swelling.  Gastrointestinal: Positive for abdominal pain, diarrhea, blood in stool and melena. Negative for nausea, vomiting and constipation.  Genitourinary: Negative for dysuria and urgency.  Musculoskeletal: Positive for back pain.  Neurological: Positive for weakness.   Blood pressure 127/50, pulse 102, temperature 97.8 F (36.6 C), temperature source Oral, resp. rate 19, SpO2 100.00%. Physical Exam  Constitutional: He is oriented to person, place, and time. He appears well-developed and well-nourished. No distress.  HENT:  Head: Normocephalic and atraumatic.  Eyes: Conjunctivae and EOM are normal. Pupils are equal, round, and reactive to light.  Cardiovascular: Normal rate, regular rhythm, normal heart sounds and intact distal pulses.  Exam reveals no gallop and no friction rub.   No murmur heard. Respiratory: Effort normal and breath sounds normal. No respiratory distress. He has no wheezes. He has no rales. He exhibits no tenderness.  GI: Soft. Bowel sounds are normal. He exhibits no distension and no mass. There is tenderness. There is no rebound and no guarding.  Musculoskeletal: He exhibits no edema and no tenderness.  Neurological: He is alert and oriented to person, place, and time.  Skin: Skin is warm and dry. He is not diaphoretic. There is pallor.  Psychiatric: He has a normal mood and affect. His behavior is normal. Judgment normal.    Assessment/Plan: Upper GI Bleed: we have reviewed previous EGDs, with his history multiple gastric surgeries we will proceed with a Upper GI series to evaluate his anatomy before we can make any determination for surgery.  Recommend continuing to monitor H&H, IV hydration, NPO.  Surgery will continue to follow.   Ayson Cherubini,  Sharlene Mccluskey  ANP-BC  01/23/2013, 2:18 PM

## 2013-01-23 NOTE — Consult Note (Signed)
Pt seen and examined.  Review of endoscopy unfortunately does not show exactly where he is bleeding.  Multiple gastric weight loss procedures done 30 years ago.  Anatomy not clear.  Had Dr Ezzard Standing,  Bariatric surgeon look at endoscopies with me.  He is not clear of what has been done. Needs UGI to define anatomy.  No acute need for surgery since he is stable.  Cont protonix and hold asa.

## 2013-01-23 NOTE — Progress Notes (Signed)
[facility] Progress Note [place] TEAM 1 - Stepdown ICU Team   Oron Westrup YQM:[id number] DOB: 18-Nov-1950 DOA: 01/23/2013 PCP: No PCP Per Patient  Brief narrative: 62 y.o. male who was recently admitted 3 weeks ago for acute GI bleed. EGD at that time was unrevealing-questioned a  dieulfoy lesion.. After discharge he did well until 24 hours prior to [facility] when he started experiencing abdominal pain followed by a large black-colored bowel movement. Patient became very dizzy and weak. EMS was called. When he tried to walk the patient had a syncopal episode with apparent LOC. Patient was brought to the ER where he was found to be hypotensive. Blood pressure improved with normal saline bolus. Hemoglobin was found to be around 6.5 drop-3 gm lower than dc reading.  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 rate 21, SpO2  91.00%.  Intake/Output Summary (Last 24 hours) at 01/23/13 1314 Last data filed at 01/23/13 1000  Gross per 24 hour  Intake 1342.16 ml  Output   1100 ml  Net 242.16 ml   Exam: Followup exam completed. Patient admitted at 4:59 AM today   Scheduled Meds:  Scheduled Meds: . insulin aspart  0-9 Units Subcutaneous Q4H  . pantoprazole (PROTONIX) IV  80 mg Intravenous Once  . [START ON 01/26/2013] pantoprazole (PROTONIX) IV  40 mg Intravenous Q12H  . sodium chloride  3 mL Intravenous Q12H   Data Reviewed: Basic Metabolic Panel:  Recent Labs Lab 01/16/13 1604 01/23/13 0205 01/23/13 1025  NA 132* 138 142  K 2.7* 4.1 4.1  CL 85* 98 105  CO2 35* 25 30  GLUCOSE 122* 268* 200*  BUN 28* 35* 35*  CREATININE 1.39* 0.92 0.93  CALCIUM 9.3 8.3* 7.8*   Liver Function Tests:  Recent Labs Lab 01/16/13 1604 01/23/13 0205 01/23/13 1025  AST 54* 27 25  ALT 40 24 22  ALKPHOS 71 55 49  BILITOT 0.4 0.3 0.3  PROT 7.1 5.6* 5.5*  ALBUMIN 3.5 2.6* 2.5*   CBC:  Recent Labs Lab 01/16/13 1604 01/23/13 0205 01/23/13 1025  WBC 9.8 13.4* 10.8*  NEUTROABS  --  9.5*  --   HGB 9.8* 6.5* 7.1*  HCT 29.7* 20.5* 21.4*  MCV 88.9 91.1 87.7  PLT 376 353 254   CBG:  Recent Labs Lab 01/23/13 0743 01/23/13 1147  GLUCAP 213* 168*    Recent Results (from the past 240 hour(s))  MRSA PCR SCREENING     Status: None  Collection Time    01/23/13  5:51 AM      Result Value Range Status   MRSA by PCR NEGATIVE  NEGATIVE Final   Comment:            The GeneXpert MRSA Assay (FDA     approved for NASAL specimens     only), is one component of a     comprehensive MRSA colonization     surveillance program. It is not     intended to diagnose MRSA     infection nor to guide or     monitor treatment for     MRSA infections.     Studies:  Recent x-ray studies have been reviewed in detail by the Attending Physician     Junious Silk, ANP [facility] Office  [phone] Pager  [phone]  **If unable to reach the above provider after paging please contact the Flow Manager @ [phone]  On-Call/Text Page:      [email or web address]      password [email or web address]  If 7PM-7AM, please contact night-coverage [email or web address] Password [email or web address] 01/23/2013, 1:14 PM   LOS: 0 days   I have personally examined this patient and reviewed the entire database. I have reviewed the above note, made any necessary editorial changes, and agree with its content.  Lonia Blood, MD [facility]

## 2013-01-23 NOTE — Consult Note (Signed)
Referring Provider: Dr. Kakrakandy Primary Care Physician:  No PCP Per Patient Primary Gastroenterologist:  Unassigned  Reason for Consultation:  Melena; GI bleed  HPI: Phillip Maldonado is a 62 y.o. male who was hospitalized a month ago for a GI bleed and had hematemesis at that time. Two endoscopies were done once on 01/01/13 where fresh blood and clots were seen without a source identified in the setting of a previous gastric bypass. A repeat EGD was done on 01/03/13 and a few small nonbleeding erosions were seen near his sutures without an active bleed or definite bleeding source. Since discharge reports doing well until yesterday when he had 2 melenic stools that were loose along with dizziness and weakness. Had epigastric abdominal pain prior to the first episode of melena. Denies hematemesis. Denies NSAIDs. Hypotensive in ER in the 80's - 90's systolic that responded to fluids. Hgb 6.5 (9.8 on 01/16/13). Reports initial gastric bypass in the 1970's and revision in the 1980's. Reports a colonoscopy years ago. Has had continued melena in the hospital.    Past Medical History  Diagnosis Date  . CHF (congestive heart failure)   . Hypertension   . Anemia   . Sleep apnea, obstructive     does where bipap  . Angina   . Shortness of breath   . Anxiety   . Blood transfusion   . Headache(784.0)   . Pneumonia   . Depression   . Hepatitis Hep B  . Neuromuscular disorder DJD  . Arthritis   . Diabetes mellitus 09/18/2011    Past Surgical History  Procedure Laterality Date  . Back surgery    . Colon surgery      Gastric Bypass-1977; Reversed in 1992 and revision 1994  . Diagnostic laparoscopy    . Esophagoscopy N/A 01/01/2013    Procedure: ESOPHAGOSCOPY;  Surgeon: Marc E Magod, MD;  Location: [facility] OR;  Service: Endoscopy;  Laterality: N/A;  . Esophagogastroduodenoscopy N/A 01/03/2013    Procedure: ESOPHAGOGASTRODUODENOSCOPY (EGD);  Surgeon: William Outlaw, MD;  Location: [facility]S;  Service:  Endoscopy;  Laterality: N/A;  . Esophagogastroduodenoscopy N/A 01/03/2013    Procedure: ESOPHAGOGASTRODUODENOSCOPY (EGD);  Surgeon: William Outlaw, MD;  Location: [facility];  Service: Endoscopy;  Laterality: N/A;    Prior to Admission medications   Medication Sig Start Date End Date Taking? Authorizing Provider  amLODipine (NORVASC) 10 MG tablet Take 10 mg by mouth daily.   Yes Historical Provider, MD  atorvastatin (LIPITOR) 40 MG tablet Take 40 mg by mouth daily.   Yes Historical Provider, MD  furosemide (LASIX) 40 MG tablet Take 1 tablet (40 mg total) by mouth 2 (two) times daily. Start this dose 01/18/13 01/16/13  Yes Elliott L Wentz, MD  gabapentin (NEURONTIN) 800 MG tablet Take 800-1,600 mg by mouth See admin instructions. 1600mg in the morning, 800mg in the afternoon, and 1600mg at night.   Yes Historical Provider, MD  HYDROmorphone (DILAUDID) 2 MG tablet Take 4 mg by mouth 3 (three) times daily as needed for severe pain.   Yes Historical Provider, MD  insulin aspart (NOVOLOG) 100 UNIT/ML injection 0-15 Units, Subcutaneous, 3 times daily with meals CBG < 70: implement hypoglycemia protocol CBG 70 - 120: 0 units CBG 121 - 150: 2 units CBG 151 - 200: 3 units CBG 201 - 250: 5 units CBG 251 - 300: 8 units CBG 301 - 350: 11 units CBG 351 - 400: 15 units CBG > 400 01/05/13  Yes Shanker M Ghimire, MD  insulin glargine (  LANTUS) 100 UNIT/ML injection Inject 0.1 mLs (10 Units total) into the skin at bedtime. 01/05/13  Yes Shanker M Ghimire, MD  metFORMIN (GLUCOPHAGE) 500 MG tablet Take 500 mg by mouth daily with breakfast.   Yes Historical Provider, MD  methadone (DOLOPHINE) 10 MG tablet Take 1 tablet (10 mg total) by mouth 3 (three) times daily. 01/05/13  Yes Shanker M Ghimire, MD  metoprolol (LOPRESSOR) 50 MG tablet Take 50 mg by mouth 2 (two) times daily.   Yes Historical Provider, MD  Multiple Vitamin (MULITIVITAMIN WITH MINERALS) TABS Take 1 tablet by mouth daily.   Yes Historical  Provider, MD  pantoprazole (PROTONIX) 40 MG tablet Take 1 tablet (40 mg total) by mouth daily. 01/05/13  Yes Shanker M Ghimire, MD  potassium chloride SA (K-DUR,KLOR-CON) 20 MEQ tablet Take 1 tablet (20 mEq total) by mouth 2 (two) times daily. 01/16/13  Yes Elliott L Wentz, MD  Testosterone (ANDROGEL PUMP) 1.25 GM/ACT (1%) GEL Place 1 application onto the skin daily.   Yes Historical Provider, MD  traZODone (DESYREL) 100 MG tablet Take 300 mg by mouth at bedtime.    Yes Historical Provider, MD  venlafaxine XR (EFFEXOR-XR) 150 MG 24 hr capsule Take 150 mg by mouth daily.   Yes Historical Provider, MD  nitroGLYCERIN (NITROSTAT) 0.4 MG SL tablet Place 0.4 mg under the tongue every 5 (five) minutes as needed. For chest pain    Historical Provider, MD  polyvinyl alcohol (LIQUIFILM TEARS) 1.4 % ophthalmic solution Place 1 drop into both eyes daily as needed. For dry eyes    Historical Provider, MD    Scheduled Meds: . insulin aspart  0-9 Units Subcutaneous TID WC  . pantoprazole (PROTONIX) IV  80 mg Intravenous Once  . [START ON 01/26/2013] pantoprazole (PROTONIX) IV  40 mg Intravenous Q12H  . sodium chloride  3 mL Intravenous Q12H   Continuous Infusions: . sodium chloride 100 mL/hr at 01/23/13 0700  . pantoprozole (PROTONIX) infusion 8 mg/hr (01/23/13 0700)   PRN Meds:.acetaminophen, acetaminophen, ondansetron (ZOFRAN) IV, ondansetron  Allergies as of 01/23/2013 - Review Complete 01/23/2013  Allergen Reaction Noted  . Adhesive [tape] Other (See Comments) 05/08/2011  . Feldene [piroxicam] Other (See Comments) 05/08/2011  . Latex  05/08/2011  . Morphine and related Other (See Comments) 05/08/2011    Family History  Problem Relation Age of Onset  . Heart disease Father   . Skin cancer Father     History   Social History  . Marital Status: Single    Spouse Name: N/A    Number of Children: N/A  . Years of Education: N/A   Occupational History  . DISABLED    Social History Main  Topics  . Smoking status: Never Smoker   . Smokeless tobacco: Not on file  . Alcohol Use: No  . Drug Use: No  . Sexual Activity: Not Currently   Other Topics Concern  . Not on file   Social History Narrative  . No narrative on file    Review of Systems: All negative except as stated above in HPI.  Physical Exam: Vital signs: Filed Vitals:   01/23/13 0815  BP: 126/62  Pulse: 96  Temp: 97.7 F (36.5 C)  Resp: 20   Last BM Date: 01/23/13 General:   Lethargic, Morbidly obese, no acute distress HEENT: anicteric Lungs:  Clear throughout to auscultation.   No wheezes, crackles, or rhonchi. No acute distress. Heart:  Regular rate and rhythm; no murmurs, clicks, rubs,  or gallops.   Abdomen: diffusely tender with guarding, soft, obese, +BS  Rectal:  Deferred Ext: 2+ pitting LE edema with erythema noted on right LE  GI:  Lab Results:  Recent Labs  01/23/13 0205  WBC 13.4*  HGB 6.5*  HCT 20.5*  PLT 353   BMET  Recent Labs  01/23/13 0205  NA 138  K 4.1  CL 98  CO2 25  GLUCOSE 268*  BUN 35*  CREATININE 0.92  CALCIUM 8.3*   LFT  Recent Labs  01/23/13 0205  PROT 5.6*  ALBUMIN 2.6*  AST 27  ALT 24  ALKPHOS 55  BILITOT 0.3   PT/INR  Recent Labs  01/23/13 0247  LABPROT 14.6  INR 1.16     Studies/Results: Dg Abd 1 View  01/23/2013   CLINICAL DATA:  Abdominal pain  EXAM: ABDOMEN - 1 VIEW  COMPARISON:  Of prior radiograph from 05/08/2011  FINDINGS: The visualized bowel gas pattern is nonspecific. No dilated loops of bowel to suggest obstruction or ileus are identified. No soft tissue masses identified. No free intraperitoneal air identified. No abnormal bowel wall thickening appreciated. Several ovoid calcific densities overlying the lateral left abdomen likely lie in the external soft tissues, stable as compared to the prior exam. Multiple surgical clips overlie the left upper quadrant.  Spinal fixation hardware again noted within the lower spine.   IMPRESSION: Nonobstructive bowel gas pattern with no acute intra-abdominal process identified. .   Electronically Signed   By: Benjamin  McClintock M.D.   On: 01/23/2013 05:41   Dg Chest Portable 1 View  01/23/2013   CLINICAL DATA:  Right-sided line placement  EXAM: PORTABLE CHEST - 1 VIEW  COMPARISON:  Prior radiograph from 01/16/2013  FINDINGS: Cardiac and mediastinal silhouettes are unchanged.  Lungs are hypoinflated. No focal infiltrate, pulmonary edema, or pleural effusion is identified. There is no pneumothorax.  Fixation hardware overlying the cervical spine is unchanged. No acute osseous abnormality.  IMPRESSION: 1. Stable appearance of the chest with shallow lung inflation. No pneumothorax status post attempted line placement. 2. No central line identified within the thorax. These results were called by telephone at the time of interpretation on 01/23/2013 at 3:56 AM to Dr.JULIE MANLY , who verbally acknowledged these results.   Electronically Signed   By: Benjamin  McClintock M.D.   On: 01/23/2013 03:57    Impression/Plan: 61 yo with remote history of gastric bypass presenting with GI bleed that is likely upper in origin with melena and dizziness. 3 g Hgb drop in one week's time. Recent upper GI bleed as discussed in HPI. Protonix drip. Bedside EGD today. Keep NPO. Transfuse. Supportive care.    LOS: 0 days   Ashya Nicolaisen C.  01/23/2013, 9:22 AM   

## 2013-01-23 NOTE — H&P (Signed)
[facility] History and Physical  Phillip Maldonado WUJ:[id number] DOB: 1950/04/04 DOA: 01/23/2013  Referring physician: ER physician. PCP: No PCP Per Patient   Chief Complaint: Abdominal pain. Black stools.  HPI: Phillip Maldonado is a 62 y.o. male who was recently admitted 3 weeks ago for acute GI bleed and has had EGD that time started experiencing abdominal pain last night. Following which patient had large black-colored bowel movement. Patient became very dizzy and weak. EMS was called. When he tried to walk patient was consciousness briefly. Patient was brought to the ER initially was hypotensive and blood pressure improved with normal saline bolus. Hemoglobin was found to be around 6.5 drop-off 3 g hemoglobin from previous and recent. Patient has been admitted for acute GI bleed. Patient has had previous history of gastric bypass. 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 Patient otherwise denies any chest pain shortness of breath nausea vomiting. Patient still has abdominal pain mostly in the epigastric area.   Review of Systems: As presented in the history of presenting illness, rest negative.  Past Medical History  Diagnosis Date  . CHF (congestive heart failure)   . Hypertension   . Anemia   . Sleep apnea, obstructive     does where bipap  . Angina   . Shortness of breath   . Anxiety   . Blood transfusion   . Headache(784.0)   . Pneumonia   . Depression   . Hepatitis Hep B  . Neuromuscular disorder DJD  . Arthritis   . Diabetes mellitus 09/18/2011   Past Surgical History  Procedure Laterality Date  . Back surgery    . Colon surgery      Gastric [phone]; Reversed in 1992 and revision 1994  . Diagnostic laparoscopy    . Esophagoscopy N/A 01/01/2013    Procedure: ESOPHAGOSCOPY;  Surgeon: Petra Kuba, MD;  Location: [facility] OR;  Service: Endoscopy;  Laterality:  N/A;  . Esophagogastroduodenoscopy N/A 01/03/2013    Procedure: ESOPHAGOGASTRODUODENOSCOPY (EGD);  Surgeon: Willis Modena, MD;  Location: [facility] ORS;  Service: Endoscopy;  Laterality: N/A;  . Esophagogastroduodenoscopy N/A 01/03/2013    Procedure: ESOPHAGOGASTRODUODENOSCOPY (EGD);  Surgeon: Willis Modena, MD;  Location: [facility];  Service: Endoscopy;  Laterality: N/A;   Social History:  reports that he has never smoked. He does not have any smokeless tobacco history on file. He reports that he does not drink alcohol or use illicit drugs. Where does patient live home. Can patient participate in ADLs? Yes.  Allergies  Allergen Reactions  . Adhesive [Tape] Other (See Comments)    unknown  . Feldene [Piroxicam] Other (See Comments)    unknown  . Latex     Rash and itching  . Morphine And Related Other (See Comments)    unknown    Family History:  Family History  Problem Relation Age of Onset  . Heart disease Father   . Skin cancer Father       Prior to Admission medications   Medication Sig Start Date End Date Taking? Authorizing Provider  amLODipine (NORVASC) 10 MG tablet Take 10 mg by mouth daily.   Yes Historical Provider, MD  atorvastatin (LIPITOR) 40 MG tablet Take 40 mg by mouth daily.   Yes Historical Provider, MD  furosemide (LASIX) 40 MG tablet Take 1 tablet (40 mg total) by mouth 2 (two) times daily. Start this dose 01/18/13 01/16/13  Yes  Flint Melter, MD  gabapentin (NEURONTIN) 800 MG tablet Take 800-1,600 mg by mouth See admin instructions. 1600mg  in the morning, 800mg  in the afternoon, and 1600mg  at night.   Yes Historical Provider, MD  HYDROmorphone (DILAUDID) 2 MG tablet Take 4 mg by mouth 3 (three) times daily as needed for severe pain.   Yes Historical Provider, MD  insulin aspart (NOVOLOG) 100 UNIT/ML injection 0-15 Units, Subcutaneous, 3 times daily with meals CBG < 70: implement hypoglycemia protocol CBG 70 - 120: 0 units CBG 121 - 150: 2 units CBG 151 -  200: 3 units CBG 201 - 250: 5 units CBG 251 - 300: 8 units CBG 301 - 350: 11 units CBG 351 - 400: 15 units CBG > 400 01/05/13  Yes Shanker Levora Dredge, MD  insulin glargine (LANTUS) 100 UNIT/ML injection Inject 0.1 mLs (10 Units total) into the skin at bedtime. 01/05/13  Yes Shanker Levora Dredge, MD  metFORMIN (GLUCOPHAGE) 500 MG tablet Take 500 mg by mouth daily with breakfast.   Yes Historical Provider, MD  methadone (DOLOPHINE) 10 MG tablet Take 1 tablet (10 mg total) by mouth 3 (three) times daily. 01/05/13  Yes Shanker Levora Dredge, MD  metoprolol (LOPRESSOR) 50 MG tablet Take 50 mg by mouth 2 (two) times daily.   Yes Historical Provider, MD  Multiple Vitamin (MULITIVITAMIN WITH MINERALS) TABS Take 1 tablet by mouth daily.   Yes Historical Provider, MD  pantoprazole (PROTONIX) 40 MG tablet Take 1 tablet (40 mg total) by mouth daily. 01/05/13  Yes Shanker Levora Dredge, MD  potassium chloride SA (K-DUR,KLOR-CON) 20 MEQ tablet Take 1 tablet (20 mEq total) by mouth 2 (two) times daily. 01/16/13  Yes Flint Melter, MD  Testosterone (ANDROGEL PUMP) 1.25 GM/ACT (1%) GEL Place 1 application onto the skin daily.   Yes Historical Provider, MD  traZODone (DESYREL) 100 MG tablet Take 300 mg by mouth at bedtime.    Yes Historical Provider, MD  venlafaxine XR (EFFEXOR-XR) 150 MG 24 hr capsule Take 150 mg by mouth daily.   Yes Historical Provider, MD  nitroGLYCERIN (NITROSTAT) 0.4 MG SL tablet Place 0.4 mg under the tongue every 5 (five) minutes as needed. For chest pain    Historical Provider, MD  polyvinyl alcohol (LIQUIFILM TEARS) 1.4 % ophthalmic solution Place 1 drop into both eyes daily as needed. For dry eyes    Historical Provider, MD    Physical Exam: Filed Vitals:   01/23/13 0400 01/23/13 0415 01/23/13 0430 01/23/13 0445  BP: 109/54 114/70 123/53 114/65  Pulse: 92 97 88 90  Temp: 97.9 F (36.6 C)     TempSrc: Oral     Resp: 20 22 18    SpO2: 96% 100% 95% 96%     General:  Well-developed and  nourished.  Eyes: Anicteric no pallor.  ENT: No discharge from the ears eyes nose mouth.  Neck: No mass felt.  Cardiovascular: S1-S2 heard.  Respiratory: No rhonchi or crepitations.  Abdomen: Soft nontender no guarding rigidity.  Skin: No rash.  Musculoskeletal: No edema.  Psychiatric: Appears normal.  Neurologic: Alert awake oriented to time place and person. Moves all extremities.  Labs on Admission:  Basic Metabolic Panel:  Recent Labs Lab 01/16/13 1604 01/23/13 0205  NA 132* 138  K 2.7* 4.1  CL 85* 98  CO2 35* 25  GLUCOSE 122* 268*  BUN 28* 35*  CREATININE 1.39* 0.92  CALCIUM 9.3 8.3*   Liver Function Tests:  Recent Labs Lab 01/16/13 1604 01/23/13  0205  AST 54* 27  ALT 40 24  ALKPHOS 71 55  BILITOT 0.4 0.3  PROT 7.1 5.6*  ALBUMIN 3.5 2.6*    Recent Labs Lab 01/23/13 0205  LIPASE 25   No results found for this basename: AMMONIA,  in the last 168 hours CBC:  Recent Labs Lab 01/16/13 1604 01/23/13 0205  WBC 9.8 13.4*  NEUTROABS  --  9.5*  HGB 9.8* 6.5*  HCT 29.7* 20.5*  MCV 88.9 91.1  PLT 376 353   Cardiac Enzymes: No results found for this basename: CKTOTAL, CKMB, CKMBINDEX, TROPONINI,  in the last 168 hours  BNP (last 3 results)  Recent Labs  01/16/13 1604  PROBNP 147.6*   CBG: No results found for this basename: GLUCAP,  in the last 168 hours  Radiological Exams on Admission: Dg Chest Portable 1 View  01/23/2013   CLINICAL DATA:  Right-sided line placement  EXAM: PORTABLE CHEST - 1 VIEW  COMPARISON:  Prior radiograph from 01/16/2013  FINDINGS: Cardiac and mediastinal silhouettes are unchanged.  Lungs are hypoinflated. No focal infiltrate, pulmonary edema, or pleural effusion is identified. There is no pneumothorax.  Fixation hardware overlying the cervical spine is unchanged. No acute osseous abnormality.  IMPRESSION: 1. Stable appearance of the chest with shallow lung inflation. No pneumothorax status post attempted line  placement. 2. No central line identified within the thorax. These results were called by telephone at the time of interpretation on 01/23/2013 at 3:56 AM to Dr.JULIE MANLY , who verbally acknowledged these results.   Electronically Signed   By: Rise Mu M.D.   On: 01/23/2013 03:57     Assessment/Plan Active Problems:   Diabetes mellitus   UGIB (upper gastrointestinal bleed)   Acute blood loss anemia   History of gastric bypass   1. Acute GI bleed - patient at this time is receiving 2 units of packed red blood cells transfusion. Protonix infusion. Recheck CBC after transfusion. I have discussed with Dr. Madilyn Fireman, on-call gastroenterologist. Further recommendations to gastroenterologist. 2. Acute blood loss anemia - closely follow CBC. 3. Diabetes mellitus type 2 - closely follow CBGs with sliding-scale coverage. 4. Hypertension - holding antihypertensives as patient was initially hypotensive. 5. Hyperlipidemia - continue statins when patient starts oral medications. 6. History of OSA.    Code Status: Full code.  Family Communication: Patient's sister at the bedside.  Disposition Plan: Admit to inpatient.    Rejina Odle N. [facility] Pager [phone].  If 7PM-7AM, please contact night-coverage [email or web address] Password [email or web address] 01/23/2013, 4:59 AM

## 2013-01-23 NOTE — Progress Notes (Signed)
Unknown Jim PA notified of radiology request to postpone Upper GI series until AM 01/24/13 due to scheduling conflict. Radiology informed if patients status changes will need to be done STAT.

## 2013-01-23 NOTE — H&P (View-Only) (Signed)
Referring Provider: Dr. Toniann Fail Primary Care Physician:  No PCP Per Patient Primary Gastroenterologist:  Gentry Fitz  Reason for Consultation:  Melena; GI bleed  HPI: Phillip Maldonado is a 62 y.o. male who was hospitalized a month ago for a GI bleed and had hematemesis at that time. Two endoscopies were done once on 01/01/13 where fresh blood and clots were seen without a source identified in the setting of a previous gastric bypass. A repeat EGD was done on 01/03/13 and a few small nonbleeding erosions were seen near his sutures without an active bleed or definite bleeding source. Since discharge reports doing well until yesterday when he had 2 melenic stools that were loose along with dizziness and weakness. Had epigastric abdominal pain prior to the first episode of melena. Denies hematemesis. Denies NSAIDs. Hypotensive in ER in the 80's - 90's systolic that responded to fluids. Hgb 6.5 (9.8 on 01/16/13). Reports initial gastric bypass in the 1970's and revision in the 1980's. Reports a colonoscopy years ago. Has had continued melena in the hospital.    Past Medical History  Diagnosis Date  . CHF (congestive heart failure)   . Hypertension   . Anemia   . Sleep apnea, obstructive     does where bipap  . Angina   . Shortness of breath   . Anxiety   . Blood transfusion   . Headache(784.0)   . Pneumonia   . Depression   . Hepatitis Hep B  . Neuromuscular disorder DJD  . Arthritis   . Diabetes mellitus 09/18/2011    Past Surgical History  Procedure Laterality Date  . Back surgery    . Colon surgery      Gastric [phone]; Reversed in 1992 and revision 1994  . Diagnostic laparoscopy    . Esophagoscopy N/A 01/01/2013    Procedure: ESOPHAGOSCOPY;  Surgeon: Petra Kuba, MD;  Location: [facility] OR;  Service: Endoscopy;  Laterality: N/A;  . Esophagogastroduodenoscopy N/A 01/03/2013    Procedure: ESOPHAGOGASTRODUODENOSCOPY (EGD);  Surgeon: Willis Modena, MD;  Location: [facility] ORS;  Service:  Endoscopy;  Laterality: N/A;  . Esophagogastroduodenoscopy N/A 01/03/2013    Procedure: ESOPHAGOGASTRODUODENOSCOPY (EGD);  Surgeon: Willis Modena, MD;  Location: [facility];  Service: Endoscopy;  Laterality: N/A;    Prior to Admission medications   Medication Sig Start Date End Date Taking? Authorizing Provider  amLODipine (NORVASC) 10 MG tablet Take 10 mg by mouth daily.   Yes Historical Provider, MD  atorvastatin (LIPITOR) 40 MG tablet Take 40 mg by mouth daily.   Yes Historical Provider, MD  furosemide (LASIX) 40 MG tablet Take 1 tablet (40 mg total) by mouth 2 (two) times daily. Start this dose 01/18/13 01/16/13  Yes Flint Melter, MD  gabapentin (NEURONTIN) 800 MG tablet Take 800-1,600 mg by mouth See admin instructions. 1600mg  in the morning, 800mg  in the afternoon, and 1600mg  at night.   Yes Historical Provider, MD  HYDROmorphone (DILAUDID) 2 MG tablet Take 4 mg by mouth 3 (three) times daily as needed for severe pain.   Yes Historical Provider, MD  insulin aspart (NOVOLOG) 100 UNIT/ML injection 0-15 Units, Subcutaneous, 3 times daily with meals CBG < 70: implement hypoglycemia protocol CBG 70 - 120: 0 units CBG 121 - 150: 2 units CBG 151 - 200: 3 units CBG 201 - 250: 5 units CBG 251 - 300: 8 units CBG 301 - 350: 11 units CBG 351 - 400: 15 units CBG > 400 01/05/13  Yes Shanker Levora Dredge, MD  insulin glargine (  LANTUS) 100 UNIT/ML injection Inject 0.1 mLs (10 Units total) into the skin at bedtime. 01/05/13  Yes Shanker Levora Dredge, MD  metFORMIN (GLUCOPHAGE) 500 MG tablet Take 500 mg by mouth daily with breakfast.   Yes Historical Provider, MD  methadone (DOLOPHINE) 10 MG tablet Take 1 tablet (10 mg total) by mouth 3 (three) times daily. 01/05/13  Yes Shanker Levora Dredge, MD  metoprolol (LOPRESSOR) 50 MG tablet Take 50 mg by mouth 2 (two) times daily.   Yes Historical Provider, MD  Multiple Vitamin (MULITIVITAMIN WITH MINERALS) TABS Take 1 tablet by mouth daily.   Yes Historical  Provider, MD  pantoprazole (PROTONIX) 40 MG tablet Take 1 tablet (40 mg total) by mouth daily. 01/05/13  Yes Shanker Levora Dredge, MD  potassium chloride SA (K-DUR,KLOR-CON) 20 MEQ tablet Take 1 tablet (20 mEq total) by mouth 2 (two) times daily. 01/16/13  Yes Flint Melter, MD  Testosterone (ANDROGEL PUMP) 1.25 GM/ACT (1%) GEL Place 1 application onto the skin daily.   Yes Historical Provider, MD  traZODone (DESYREL) 100 MG tablet Take 300 mg by mouth at bedtime.    Yes Historical Provider, MD  venlafaxine XR (EFFEXOR-XR) 150 MG 24 hr capsule Take 150 mg by mouth daily.   Yes Historical Provider, MD  nitroGLYCERIN (NITROSTAT) 0.4 MG SL tablet Place 0.4 mg under the tongue every 5 (five) minutes as needed. For chest pain    Historical Provider, MD  polyvinyl alcohol (LIQUIFILM TEARS) 1.4 % ophthalmic solution Place 1 drop into both eyes daily as needed. For dry eyes    Historical Provider, MD    Scheduled Meds: . insulin aspart  0-9 Units Subcutaneous TID WC  . pantoprazole (PROTONIX) IV  80 mg Intravenous Once  . [START ON 01/26/2013] pantoprazole (PROTONIX) IV  40 mg Intravenous Q12H  . sodium chloride  3 mL Intravenous Q12H   Continuous Infusions: . sodium chloride 100 mL/hr at 01/23/13 0700  . pantoprozole (PROTONIX) infusion 8 mg/hr (01/23/13 0700)   PRN Meds:.acetaminophen, acetaminophen, ondansetron (ZOFRAN) IV, ondansetron  Allergies as of 01/23/2013 - Review Complete 01/23/2013  Allergen Reaction Noted  . Adhesive [tape] Other (See Comments) 05/08/2011  . Feldene [piroxicam] Other (See Comments) 05/08/2011  . Latex  05/08/2011  . Morphine and related Other (See Comments) 05/08/2011    Family History  Problem Relation Age of Onset  . Heart disease Father   . Skin cancer Father     History   Social History  . Marital Status: Single    Spouse Name: N/A    Number of Children: N/A  . Years of Education: N/A   Occupational History  . DISABLED    Social History Main  Topics  . Smoking status: Never Smoker   . Smokeless tobacco: Not on file  . Alcohol Use: No  . Drug Use: No  . Sexual Activity: Not Currently   Other Topics Concern  . Not on file   Social History Narrative  . No narrative on file    Review of Systems: All negative except as stated above in HPI.  Physical Exam: Vital signs: Filed Vitals:   01/23/13 0815  BP: 126/62  Pulse: 96  Temp: 97.7 F (36.5 C)  Resp: 20   Last BM Date: 01/23/13 General:   Lethargic, Morbidly obese, no acute distress HEENT: anicteric Lungs:  Clear throughout to auscultation.   No wheezes, crackles, or rhonchi. No acute distress. Heart:  Regular rate and rhythm; no murmurs, clicks, rubs,  or gallops.  Abdomen: diffusely tender with guarding, soft, obese, +BS  Rectal:  Deferred Ext: 2+ pitting LE edema with erythema noted on right LE  GI:  Lab Results:  Recent Labs  01/23/13 0205  WBC 13.4*  HGB 6.5*  HCT 20.5*  PLT 353   BMET  Recent Labs  01/23/13 0205  NA 138  K 4.1  CL 98  CO2 25  GLUCOSE 268*  BUN 35*  CREATININE 0.92  CALCIUM 8.3*   LFT  Recent Labs  01/23/13 0205  PROT 5.6*  ALBUMIN 2.6*  AST 27  ALT 24  ALKPHOS 55  BILITOT 0.3   PT/INR  Recent Labs  01/23/13 0247  LABPROT 14.6  INR 1.16     Studies/Results: Dg Abd 1 View  01/23/2013   CLINICAL DATA:  Abdominal pain  EXAM: ABDOMEN - 1 VIEW  COMPARISON:  Of prior radiograph from 05/08/2011  FINDINGS: The visualized bowel gas pattern is nonspecific. No dilated loops of bowel to suggest obstruction or ileus are identified. No soft tissue masses identified. No free intraperitoneal air identified. No abnormal bowel wall thickening appreciated. Several ovoid calcific densities overlying the lateral left abdomen likely lie in the external soft tissues, stable as compared to the prior exam. Multiple surgical clips overlie the left upper quadrant.  Spinal fixation hardware again noted within the lower spine.   IMPRESSION: Nonobstructive bowel gas pattern with no acute intra-abdominal process identified. .   Electronically Signed   By: Rise Mu M.D.   On: 01/23/2013 05:41   Dg Chest Portable 1 View  01/23/2013   CLINICAL DATA:  Right-sided line placement  EXAM: PORTABLE CHEST - 1 VIEW  COMPARISON:  Prior radiograph from 01/16/2013  FINDINGS: Cardiac and mediastinal silhouettes are unchanged.  Lungs are hypoinflated. No focal infiltrate, pulmonary edema, or pleural effusion is identified. There is no pneumothorax.  Fixation hardware overlying the cervical spine is unchanged. No acute osseous abnormality.  IMPRESSION: 1. Stable appearance of the chest with shallow lung inflation. No pneumothorax status post attempted line placement. 2. No central line identified within the thorax. These results were called by telephone at the time of interpretation on 01/23/2013 at 3:56 AM to Dr.JULIE MANLY , who verbally acknowledged these results.   Electronically Signed   By: Rise Mu M.D.   On: 01/23/2013 03:57    Impression/Plan: 62 yo with remote history of gastric bypass presenting with GI bleed that is likely upper in origin with melena and dizziness. 3 g Hgb drop in one week's time. Recent upper GI bleed as discussed in HPI. Protonix drip. Bedside EGD today. Keep NPO. Transfuse. Supportive care.    LOS: 0 days   Cayleb Jarnigan C.  01/23/2013, 9:22 AM

## 2013-01-23 NOTE — Brief Op Note (Signed)
Large black and maroon blood clot obscuring the majority of the proximal stomach. Blue suture material seen at what appears to be the base of this clot. The clot was adherent at this point and could not be dislodged with repeated irrigation. Small amount of fresh blood noted in distal stomach and duodenal bulb without a source identified. Bleeding concerning for possible ulcer in proximal stomach and concerning for area of previous surgery. Continue Protonix drip. Surgical consult. With location of adherent clot and previous surgery would probably preclude embolization. See Endopro note for details.

## 2013-01-23 NOTE — ED Provider Notes (Signed)
CSN: [id number]     Arrival date & time 01/23/13  0146 History   First MD Initiated Contact with Patient 01/23/13 0152     Chief Complaint  Patient presents with  . Abdominal Pain   (Consider location/radiation/quality/duration/timing/severity/associated sxs/prior Treatment) HPI  [name] is a 62 yo man with a recent history of GI bleeding requiring blood transfusion. He presents via EMS with complaints of epigastric pain, nausea and near syncope. He says that his sx began a couple of hours prior to arrival and have worsened.   He has not appreciated a change in the color of his stools. He is not taking an anticoagulant or antiplatelet. EGD performed during Jan 05, 2013 admission was negative for source of bleeding but notes speculation that sutures from remote gastric bypass may have eroded into a vessel.  Patient denies chest pain and SOB. Paramedics note that when the patient attempted to rise from his chair to get onto gurney, he passed out and fell forward, sustaining trauma to his face. The patient denies any facial or oral or dental pain at this time. He is quite nauseated but, denies vomiting.   Past Medical History  Diagnosis Date  . CHF (congestive heart failure)   . Hypertension   . Anemia   . Sleep apnea, obstructive     does where bipap  . Angina   . Shortness of breath   . Anxiety   . Blood transfusion   . Headache(784.0)   . Pneumonia   . Depression   . Hepatitis Hep B  . Neuromuscular disorder DJD  . Arthritis   . Diabetes mellitus 09/18/2011   Past Surgical History  Procedure Laterality Date  . Back surgery    . Colon surgery      Gastric [phone]; Reversed in 1992 and revision 1994  . Diagnostic laparoscopy    . Esophagoscopy N/A 01/01/2013    Procedure: ESOPHAGOSCOPY;  Surgeon: Petra Kuba, MD;  Location: [facility] OR;  Service: Endoscopy;  Laterality: N/A;  . Esophagogastroduodenoscopy N/A 01/03/2013    Procedure: ESOPHAGOGASTRODUODENOSCOPY (EGD);   Surgeon: Willis Modena, MD;  Location: [facility] ORS;  Service: Endoscopy;  Laterality: N/A;  . Esophagogastroduodenoscopy N/A 01/03/2013    Procedure: ESOPHAGOGASTRODUODENOSCOPY (EGD);  Surgeon: Willis Modena, MD;  Location: [facility];  Service: Endoscopy;  Laterality: N/A;   Family History  Problem Relation Age of Onset  . Heart disease Father   . Skin cancer Father    History  Substance Use Topics  . Smoking status: Never Smoker   . Smokeless tobacco: Not on file  . Alcohol Use: No    Review of Systems 10 point review of systems obtained and is negative with the exception of symptoms noted above. Allergies  Adhesive; Feldene; Latex; and Morphine and related  Home Medications   Current Outpatient Rx  Name  Route  Sig  Dispense  Refill  . amLODipine (NORVASC) 10 MG tablet   Oral   Take 10 mg by mouth daily.         Marland Kitchen atorvastatin (LIPITOR) 10 MG tablet   Oral   Take 10 mg by mouth daily.         Marland Kitchen desvenlafaxine (PRISTIQ) 50 MG 24 hr tablet   Oral   Take 50 mg by mouth daily.         . furosemide (LASIX) 40 MG tablet   Oral   Take 1 tablet (40 mg total) by mouth 2 (two) times daily. Start this dose 01/18/13  60 tablet   0   . gabapentin (NEURONTIN) 800 MG tablet   Oral   Take 800-1,600 mg by mouth 3 (three) times daily. 1600mg  in the morning, 800mg  in the afternoon, and 1600mg  at night.         . insulin aspart (NOVOLOG) 100 UNIT/ML injection      0-15 Units, Subcutaneous, 3 times daily with meals CBG < 70: implement hypoglycemia protocol CBG 70 - 120: 0 units CBG 121 - 150: 2 units CBG 151 - 200: 3 units CBG 201 - 250: 5 units CBG 251 - 300: 8 units CBG 301 - 350: 11 units CBG 351 - 400: 15 units CBG > 400   1 vial   12   . insulin glargine (LANTUS) 100 UNIT/ML injection   Subcutaneous   Inject 0.1 mLs (10 Units total) into the skin at bedtime.   10 mL   12   . losartan (COZAAR) 100 MG tablet   Oral   Take 100 mg by mouth daily.          . methadone (DOLOPHINE) 10 MG tablet   Oral   Take 1 tablet (10 mg total) by mouth 3 (three) times daily.   30 tablet   0   . metolazone (ZAROXOLYN) 5 MG tablet   Oral   Take 5 mg by mouth daily.         . metoprolol (LOPRESSOR) 50 MG tablet   Oral   Take 25 mg by mouth 2 (two) times daily.         . Multiple Vitamin (MULITIVITAMIN WITH MINERALS) TABS   Oral   Take 1 tablet by mouth daily.         . nitroGLYCERIN (NITROSTAT) 0.4 MG SL tablet   Sublingual   Place 0.4 mg under the tongue every 5 (five) minutes as needed. For chest pain         . pantoprazole (PROTONIX) 40 MG tablet   Oral   Take 1 tablet (40 mg total) by mouth daily.         . polycarbophil (FIBERCON) 625 MG tablet   Oral   Take 625 mg by mouth daily.         . polyvinyl alcohol (LIQUIFILM TEARS) 1.4 % ophthalmic solution   Both Eyes   Place 1 drop into both eyes daily as needed. For dry eyes         . potassium chloride SA (K-DUR,KLOR-CON) 20 MEQ tablet   Oral   Take 1 tablet (20 mEq total) by mouth 2 (two) times daily.   3 tablet   6   . Testosterone (ANDROGEL PUMP) 1.25 GM/ACT (1%) GEL   Transdermal   Place 1 application onto the skin daily.         . traZODone (DESYREL) 100 MG tablet   Oral   Take 300 mg by mouth at bedtime.          Marland Kitchen venlafaxine (EFFEXOR) 75 MG tablet   Oral   Take 75 mg by mouth 2 (two) times daily.          There were no vitals taken for this visit. Physical Exam Gen: Obese, well-developed, ill-appearing Head: NCAT Eyes: PERL, EOMI Nose: no epistaixis or rhinorrhea Mouth/throat: mucosa is dehydrated appearing and pale appearing Neck: no c spine ttp supple, no stridor Lungs: CTA B, no wheezing, rhonchi or rales CV: RRR, no murmur, palpable radial pulses bilaterally Abd: morbidly obese, several well healed incisions  noted, soft, tender over the epigastrium, nondistended Back: no ttp, no cva ttp Skin: pale, cool Neuro: CN ii-xii grossly  intact, no focal deficits Psyche; flat affect,  calm and cooperative.   ED Course  Procedures (including critical care time) Labs Review  Results for orders placed during the hospital encounter of 01/23/13 (from the past 24 hour(s))  CBC WITH DIFFERENTIAL     Status: Abnormal   Collection Time    01/23/13  2:05 AM      Result Value Range   WBC 13.4 (*) 4.0 - 10.5 K/uL   RBC 2.25 (*) 4.22 - 5.81 MIL/uL   Hemoglobin 6.5 (*) 13.0 - 17.0 g/dL   HCT 16.1 (*) 09.6 - 04.5 %   MCV 91.1  78.0 - 100.0 fL   MCH 28.9  26.0 - 34.0 pg   MCHC 31.7  30.0 - 36.0 g/dL   RDW 40.9  81.1 - 91.4 %   Platelets 353  150 - 400 K/uL   Neutrophils Relative % 71  43 - 77 %   Neutro Abs 9.5 (*) 1.7 - 7.7 K/uL   Lymphocytes Relative 24  12 - 46 %   Lymphs Abs 3.3  0.7 - 4.0 K/uL   Monocytes Relative 4  3 - 12 %   Monocytes Absolute 0.5  0.1 - 1.0 K/uL   Eosinophils Relative 1  0 - 5 %   Eosinophils Absolute 0.1  0.0 - 0.7 K/uL   Basophils Relative 0  0 - 1 %   Basophils Absolute 0.0  0.0 - 0.1 K/uL  COMPREHENSIVE METABOLIC PANEL     Status: Abnormal   Collection Time    01/23/13  2:05 AM      Result Value Range   Sodium 138  135 - 145 mEq/L   Potassium 4.1  3.5 - 5.1 mEq/L   Chloride 98  96 - 112 mEq/L   CO2 25  19 - 32 mEq/L   Glucose, Bld 268 (*) 70 - 99 mg/dL   BUN 35 (*) 6 - 23 mg/dL   Creatinine, Ser 7.82  0.50 - 1.35 mg/dL   Calcium 8.3 (*) 8.4 - 10.5 mg/dL   Total Protein 5.6 (*) 6.0 - 8.3 g/dL   Albumin 2.6 (*) 3.5 - 5.2 g/dL   AST 27  0 - 37 U/L   ALT 24  0 - 53 U/L   Alkaline Phosphatase 55  39 - 117 U/L   Total Bilirubin 0.3  0.3 - 1.2 mg/dL   GFR calc non Af Amer 89 (*) >90 mL/min   GFR calc Af Amer >90  >90 mL/min  LIPASE, BLOOD     Status: None   Collection Time    01/23/13  2:05 AM      Result Value Range   Lipase 25  11 - 59 U/L  OCCULT BLOOD, POC DEVICE     Status: Abnormal   Collection Time    01/23/13  2:25 AM      Result Value Range   Fecal Occult Bld POSITIVE (*)  NEGATIVE  TYPE AND SCREEN     Status: None   Collection Time    01/23/13  2:35 AM      Result Value Range   ABO/RH(D) O POS     Antibody Screen NEG     Sample Expiration 01/26/2013     Unit Number [id number]     Blood Component Type RBC LR PHER1     Unit division 00  Status of Unit ALLOCATED     Transfusion Status OK TO TRANSFUSE     Crossmatch Result Compatible     Unit Number [id number]     Blood Component Type RED CELLS,LR     Unit division 00     Status of Unit ALLOCATED     Transfusion Status OK TO TRANSFUSE     Crossmatch Result Compatible     Unit Number [id number]     Blood Component Type RBC LR PHER1     Unit division 00     Status of Unit ALLOCATED     Transfusion Status OK TO TRANSFUSE     Crossmatch Result Compatible     Unit Number [id number]     Blood Component Type RBC LR PHER2     Unit division 00     Status of Unit ISSUED     Transfusion Status OK TO TRANSFUSE     Crossmatch Result Compatible    PROTIME-INR     Status: None   Collection Time    01/23/13  2:47 AM      Result Value Range   Prothrombin Time 14.6  11.6 - 15.2 seconds   INR 1.16  0.00 - 1.49  APTT     Status: Abnormal   Collection Time    01/23/13  2:47 AM      Result Value Range   aPTT 23 (*) 24 - 37 seconds  POCT I-STAT TROPONIN I     Status: None   Collection Time    01/23/13  2:51 AM      Result Value Range   Troponin i, poc 0.01  0.00 - 0.08 ng/mL   Comment 3           PREPARE RBC (CROSSMATCH)     Status: None   Collection Time    01/23/13  3:00 AM      Result Value Range   Order Confirmation ORDER PROCESSED BY BLOOD BANK       EKG Interpretation   None       MDM  Patient with multiple co-morbidities and recent admission for GI bleeding requiring transfusion now here with recurrent GI bleed, 3 gram drop in hemoglobin over the past 7 days, hypotension and syncope. We have fluid resuscitated with good response. SBP now above 100 mg/hg.    Difficult  peripheral IV access.  Nursing placed 20g angiocath in the left hand.  I attempted to place central line. The patient has chronic neck pain & a very short, thick neck and is unable to extend his neck to a neutral position. Thus, decided that subclavian access would be best site. Please see separate procedure note. Unable to obtain CVC placement. However, I was able to place a 2nd 20g angiocath in the right EJ following initial fluid resuscitation.   At this point, as the patient has stabilized and we are initiating transfusion, I feel comfortable with current PIV access. We will attempt further PIV access as patient is further resuscitated. We are treating with protonix ivp and gtt. We are placing NGT.   I have advised Dr. Missy Sabins of the ICU about the patient's presentation. We concur, that he appears to have averted an ICU admission, at this point. However, she is aware of the patient's case. I have paged the [facility] to request admission to SDU.   CENTRAL LINE Performed by: Brandt Loosen Consent: The procedure was performed in an emergent situation. Sister and patient consented Required items: required  blood products, IVF Patient identity confirmed: arm band and provided demographic data Time out: Immediately prior to procedure a "time out" was called to verify the correct patient, procedure, equipment, support staff and site/side marked as required. Indications: vascular access Anesthesia: local infiltration Local anesthetic: lidocaine 1% with epinephrine Anesthetic total: 10 ml Patient sedated: no Preparation: skin prepped with 2% chlorhexidine Skin prep agent dried: skin prep agent completely dried prior to procedure Sterile barriers: all five maximum sterile barriers used - cap, mask, sterile gown, sterile gloves, and large sterile sheet Hand hygiene: hand hygiene performed prior to central venous catheter insertion  Location details: right subclavian  Catheter type: triple  lumen Catheter size: 8 Fr Pre-procedure: landmarks identified Ultrasound guidance: no Successful placement: no - unable to get flash/access vessel Post-procedure CXR obtained and is negative for PTX Patient tolerance: Patient tolerated the procedure well with no immediate complications.  CRITICAL CARE Performed by: Brandt Loosen   Total critical care time: 12m  Critical care time was exclusive of separately billable procedures and treating other patients.  Critical care was necessary to treat or prevent imminent or life-threatening deterioration.  Critical care was time spent personally by me on the following activities: development of treatment plan with patient and/or surrogate as well as nursing, discussions with consultants, evaluation of patient's response to treatment, examination of patient, obtaining history from patient or surrogate, ordering and performing treatments and interventions, ordering and review of laboratory studies, ordering and review of radiographic studies, pulse oximetry and re-evaluation of patient's condition.  [id number]:  Case discussed with Dr. Toniann Fail who has accepted the patient for admission to the SDU.   Brandt Loosen, MD 01/23/13 [phone]

## 2013-01-23 NOTE — Op Note (Signed)
[facility] [address] [place], [address]   ENDOSCOPY PROCEDURE REPORT  PATIENT: Phillip Maldonado, Phillip Maldonado  MR#: [id number] BIRTHDATE: 1950/12/30 , 61  yrs. old GENDER: Male  ENDOSCOPIST: Charlott Rakes, MD REFERRED NW:GNFAOZHY team  PROCEDURE DATE:  01/23/2013 PROCEDURE:   EGD, diagnostic ASA CLASS:   Class III INDICATIONS:Melena. MEDICATIONS: Fentanyl 50 mcg IV, Versed 9 mg IV, Cetacaine spray x 2, and Diphenhydramine (Benadryl) 25 mg IV  TOPICAL ANESTHETIC:  DESCRIPTION OF PROCEDURE:   After the risks benefits and alternatives of the procedure were thoroughly explained, informed consent was obtained.  The Pentax Gastroscope [id number]  endoscope was introduced through the mouth and advanced to the second portion of the duodenum , limited by Without limitations.   The instrument was slowly withdrawn as the mucosa was fully examined.     FINDINGS: The endoscope was inserted into the oropharynx and esophagus was intubated.  The esophagus was normal in its entirety. The gastroesophageal junction was noted to be 44 cm from the incisors. Upon reaching the stomach a massive amount of black and maroon-colored blood was seen in the proximal and mid-portion of the stomach that could not be irrigated away. Black colored blood also noted distally in smaller amounts preventing complete visualization of the antrum. Endoscope was advanced into the stomach  The endoscope was advanced to the duodenal bulb and second portion of duodenum where small amounts of red and dark red blood was seen without an identified source. The endoscope was withdrawn back into the stomach and the large amount of blood clots and products prevented visualization of most of the cardia and fundus. Near blue suture material what appeared to be the base of this clot was noted without an identified ulcer or other source. The clot could not be dislodged with repeated irrigation. No active  bleeding was seen.  COMPLICATIONS: None  ENDOSCOPIC IMPRESSION:     Massive blood clot and products in stomach concerning for a possible ulcer with suture material nearby - NO ulcer seen (see above for details); Due to location and altered anatomy will consult surgery for further evaluation of this GI bleed  RECOMMENDATIONS: NPO; Continue Protonix drip; Supportive care; Surgery consult   REPEAT EXAM: N/A  _______________________________ Charlott Rakes, MD eSigned:  Charlott Rakes, MD 01/23/2013 2:54 PM    CC:  PATIENT NAME:  Phillip Maldonado, Phillip Maldonado MR#: [id number]

## 2013-01-23 NOTE — Interval H&P Note (Signed)
History and Physical Interval Note:  01/23/2013 12:37 PM  Phillip Maldonado  has presented today for surgery, with the diagnosis of GI bleed   The various methods of treatment have been discussed with the patient and family. After consideration of risks, benefits and other options for treatment, the patient has consented to  Procedure(s): ESOPHAGOGASTRODUODENOSCOPY (EGD) (N/A) as a surgical intervention .  The patient's history has been reviewed, patient examined, no change in status, stable for surgery.  I have reviewed the patient's chart and labs.  Questions were answered to the patient's satisfaction.     Laquisha Northcraft C.

## 2013-01-24 ENCOUNTER — Inpatient Hospital Stay (HOSPITAL_COMMUNITY): Payer: Medicare Other

## 2013-01-24 ENCOUNTER — Encounter (HOSPITAL_COMMUNITY): Payer: Self-pay | Admitting: Gastroenterology

## 2013-01-24 DIAGNOSIS — I4891 Unspecified atrial fibrillation: Secondary | ICD-10-CM

## 2013-01-24 HISTORY — DX: Unspecified atrial fibrillation: I48.91

## 2013-01-24 LAB — CBC
HCT: 19.3 % — ABNORMAL LOW (ref 39.0–52.0)
HCT: 18.1 % — ABNORMAL LOW (ref 39.0–52.0)
Hemoglobin: 6.1 g/dL — CL (ref 13.0–17.0)
Hemoglobin: 5.7 g/dL — CL (ref 13.0–17.0)
MCH: 28.5 pg (ref 26.0–34.0)
MCH: 28.1 pg (ref 26.0–34.0)
MCHC: 31.6 g/dL (ref 30.0–36.0)
MCHC: 31.5 g/dL (ref 30.0–36.0)
MCV: 90.2 fL (ref 78.0–100.0)
MCV: 89.2 fL (ref 78.0–100.0)
Platelets: 228 10*3/uL (ref 150–400)
Platelets: 248 K/uL (ref 150–400)
RBC: 2.03 MIL/uL — ABNORMAL LOW (ref 4.22–5.81)
RDW: 16.2 % — ABNORMAL HIGH (ref 11.5–15.5)
RDW: 16.3 % — ABNORMAL HIGH (ref 11.5–15.5)
WBC: 7.7 10*3/uL (ref 4.0–10.5)
WBC: 8 K/uL (ref 4.0–10.5)

## 2013-01-24 LAB — BASIC METABOLIC PANEL
CO2: 23 mEq/L (ref 19–32)
Calcium: 8.1 mg/dL — ABNORMAL LOW (ref 8.4–10.5)
Chloride: 111 mEq/L (ref 96–112)
Creatinine, Ser: 0.79 mg/dL (ref 0.50–1.35)
GFR calc Af Amer: >90 mL/min (ref >90)
Glucose, Bld: 151 mg/dL — ABNORMAL HIGH (ref 70–99)
Potassium: 4 mEq/L (ref 3.5–5.1)
Sodium: 142 mEq/L (ref 135–145)

## 2013-01-24 LAB — GLUCOSE, CAPILLARY
Glucose-Capillary: 146 mg/dL — ABNORMAL HIGH (ref 70–99)
Glucose-Capillary: 147 mg/dL — ABNORMAL HIGH (ref 70–99)
Glucose-Capillary: 152 mg/dL — ABNORMAL HIGH (ref 70–99)
Glucose-Capillary: 156 mg/dL — ABNORMAL HIGH (ref 70–99)
Glucose-Capillary: 159 mg/dL — ABNORMAL HIGH (ref 70–99)

## 2013-01-24 LAB — PREPARE RBC (CROSSMATCH)

## 2013-01-24 MED ORDER — IOHEXOL 300 MG/ML  SOLN
150.0000 mL | Freq: Once | INTRAMUSCULAR | Status: AC | PRN
  Administered 2013-01-24: 75 mL via ORAL

## 2013-01-24 MED ORDER — LORAZEPAM 2 MG/ML IJ SOLN
0.5000 mg | Freq: Four times a day (QID) | INTRAMUSCULAR | Status: DC | PRN
  Administered 2013-01-24 – 2013-01-25 (×4): 0.5 mg via INTRAVENOUS
  Filled 2013-01-24 (×4): qty 1

## 2013-01-24 MED ORDER — TECHNETIUM TC 99M-LABELED RED BLOOD CELLS IV KIT: 25.0000 | PACK | Freq: Once | INTRAVENOUS | Status: AC | PRN

## 2013-01-24 MED ORDER — DEXTROSE 5 % IV SOLN
5.0000 mg/h | INTRAVENOUS | Status: DC
  Administered 2013-01-24: 5 mg/h via INTRAVENOUS
  Administered 2013-01-24 – 2013-01-27 (×6): 10 mg/h via INTRAVENOUS
  Filled 2013-01-24 (×7): qty 100

## 2013-01-24 MED ORDER — SODIUM CHLORIDE 0.9 % IV SOLN
INTRAVENOUS | Status: DC
  Administered 2013-01-26 (×2): via INTRAVENOUS

## 2013-01-24 MED ORDER — TECHNETIUM TC 99M-LABELED RED BLOOD CELLS IV KIT
25.0000 | PACK | Freq: Once | INTRAVENOUS | Status: AC | PRN
  Administered 2013-01-24: 25 via INTRAVENOUS

## 2013-01-24 MED ORDER — MORPHINE SULFATE 2 MG/ML IJ SOLN
2.0000 mg | INTRAMUSCULAR | Status: DC | PRN
  Administered 2013-01-24 – 2013-01-25 (×4): 2 mg via INTRAVENOUS
  Filled 2013-01-24 (×4): qty 1

## 2013-01-24 NOTE — Progress Notes (Signed)
Came by to see patient; he is off floor getting his UGI series done.

## 2013-01-24 NOTE — Progress Notes (Signed)
Stable.UGI to determine anatomy.  Hopefully will resolve with OR intervention.

## 2013-01-24 NOTE — Progress Notes (Signed)
Dr. Butler Denmark notified of hgb 6.1

## 2013-01-24 NOTE — Progress Notes (Signed)
Central Venous Catheter Insertion Procedure Note Phillip Maldonado [id number] June 29, 1950  Procedure: Insertion of Central Venous Catheter Indications: Assessment of intravascular volume, Drug and/or fluid administration and Frequent blood sampling  Procedure Details Consent: Risks of procedure as well as the alternatives and risks of each were explained to the (patient/caregiver).  Consent for procedure obtained. Time Out: Verified patient identification, verified procedure, site/side was marked, verified correct patient position, special equipment/implants available, medications/allergies/relevent history reviewed, required imaging and test results available.  Performed  Maximum sterile technique was used including antiseptics, cap, gloves, gown, hand hygiene, mask and sheet. Skin prep: Chlorhexidine; local anesthetic administered A antimicrobial bonded/coated triple lumen catheter was placed in the right internal jugular vein using the Seldinger technique. Ultrasound guidance used.yes Catheter placed to 18 cm. Blood aspirated via all 3 ports and then flushed x 3. Line sutured x 2 and dressing applied.  Evaluation Blood flow good Complications: No apparent complications Patient did tolerate procedure well. Chest X-ray ordered to verify placement.  CXR: pending.  Scribed by Kandice Robinsons, RN ACNP Student, [employer] for Anders Simmonds ACNP  01/24/2013, 11:31 AM  I was present and supervised procedure.  U/S used in placement.  Alyson Reedy, M.D. [facility] Pulmonary/Critical Care Medicine. Pager: [phone]. After hours pager: [phone].

## 2013-01-24 NOTE — Progress Notes (Signed)
[facility] Progress Note [place] TEAM 1 - Stepdown ICU Team   Calven Gilkes VHQ:[id number] DOB: [birth year] DOA: 01/23/2013 PCP: No PCP Per Patient  Brief narrative: 62 y.o. male who was recently admitted 3 weeks ago for acute GI bleed. EGD at that time was unrevealing-questioned a  dieulfoy lesion.. After discharge he did well until 24 hours prior to [facility] when he started experiencing abdominal pain followed by a large black-colored bowel movement. Patient became very dizzy and weak. EMS was called. When he tried to walk the patient had a syncopal episode with apparent LOC. Patient was brought to the ER where he was found to be hypotensive. Blood pressure improved with normal saline bolus. Hemoglobin was found to be around 6.5 drop-3 gm lower than dc reading.  Assessment/Plan:    UGIB (upper gastrointestinal bleed) -GI consulted-underwent EGD 11/10: large maroon clot seen without actual source of bleeding clarified-surgery consulted- UGIS done to better clarify anatomy -occult bleeding continues as evidenced by decreasing hgb -CCS and GI Transport planner) updated- GI requests NM Bleeding scan- ordered stat-NM called and said can only image 12" diameter due to pt obesity so instructed to focus on cardia and duodenal areas -rec cont PPI gtt -due to location likely cannot embolize    Acute blood loss anemia -Initial hgb nadir 6.5 and briefly up to 7.6 but this am trend back down to 5.7 -poor IV access so emergent CL placed per PCCM -2 units PRBCs today for total of 4 since admit   Atrial Fibrillation/RVR -seems exacerbated by ABL anemia -EKG/Cardizem gtt -IV Amiodarone if developes hypotension    HTN -home meds on hold    History "CHF" -diuretics on hold -no prior ECHO and now with AF and unstable so will check ECHO    Diabetes mellitus -Lantus started last admit    OSA (obstructive sleep apnea) -HS CPAP    History of gastric bypass -initially done in 1977 and has  had 2 revisions since   Shoulder pain -on methadone pre admit with Neurontin    Depression -on Effexor, Trazadone, and Pristiq   DVT prophylaxis: SCDs Code Status: Full Family Communication: Patient Disposition Plan/Expected LOS: Stepdown but high risk to decompensate and require ICU level of care  Consultants: Gastroenterology  Procedures: EGD 11/10  Antibiotics: None  HPI/Subjective: Patient awakened, no abdominal pain or SOB endorsed  Objective: Blood pressure 111/79, pulse 116, temperature 97.7 F (36.5 C), temperature source Oral, resp. rate 20, height 5\' 11"  (1.803 m), weight 451 lb 15.1 oz (205 kg), SpO2 99.00%.  Intake/Output Summary (Last 24 hours) at 01/24/13 1441 Last data filed at 01/24/13 1405  Gross per 24 hour  Intake   2673 ml  Output   2300 ml  Net    373 ml   Exam: Followup exam completed. Patient admitted at 4:59 AM today   Scheduled Meds:  Scheduled Meds: . insulin aspart  0-9 Units Subcutaneous Q4H  . pantoprazole (PROTONIX) IV  80 mg Intravenous Once  . [START ON 01/26/2013] pantoprazole (PROTONIX) IV  40 mg Intravenous Q12H  . sodium chloride  3 mL Intravenous Q12H   Data Reviewed: Basic Metabolic Panel:  Recent Labs Lab 01/23/13 0205 01/23/13 1025  NA 138 142  K 4.1 4.1  CL 98 105  CO2 25 30  GLUCOSE 268* 200*  BUN 35* 35*  CREATININE 0.92 0.93  CALCIUM 8.3* 7.8*   Liver Function Tests:  Recent Labs Lab 01/23/13 0205 01/23/13 1025  AST 27 25  ALT 24  22  ALKPHOS 55 49  BILITOT 0.3 0.3  PROT 5.6* 5.5*  ALBUMIN 2.6* 2.5*   CBC:  Recent Labs Lab 01/23/13 0205 01/23/13 1025 01/23/13 1700 01/24/13 0711 01/24/13 0847  WBC 13.4* 10.8* 11.6* 7.7 8.0  NEUTROABS 9.5*  --   --   --   --   HGB 6.5* 7.1* 7.6* 6.1* 5.7*  HCT 20.5* 21.4* 23.8* 19.3* 18.1*  MCV 91.1 87.7 89.5 90.2 89.2  PLT 353 254 263 228 248   CBG:  Recent Labs Lab 01/23/13 2058 01/24/13 0041 01/24/13 0431 01/24/13 0804 01/24/13 1213    GLUCAP 181* 158* 156* 147* 152*    Recent Results (from the past 240 hour(s))  MRSA PCR SCREENING     Status: None   Collection Time    01/23/13  5:51 AM      Result Value Range Status   MRSA by PCR NEGATIVE  NEGATIVE Final   Comment:            The GeneXpert MRSA Assay (FDA     approved for NASAL specimens     only), is one component of a     comprehensive MRSA colonization     surveillance program. It is not     intended to diagnose MRSA     infection nor to guide or     monitor treatment for     MRSA infections.     Studies:  Recent x-ray studies have been reviewed in detail by the Attending Physician     Junious Silk, ANP [facility] Office  [phone] Pager [phone]  **If unable to reach the above provider after paging please contact the Flow Manager @ [phone]  On-Call/Text Page:      [email or web address]      password [email or web address]  If 7PM-7AM, please contact night-coverage [email or web address] Password [email or web address] 01/24/2013, 2:40 PM   LOS: 1 day      I have examined the patient, reviewed the chart and modified the above note which I agree with.   Faron Whitelock,MD [phone] 01/24/2013, 5:02 PM

## 2013-01-24 NOTE — Progress Notes (Signed)
Patient ID: Phillip Maldonado, male   DOB: 1950/08/16, 62 y.o.   MRN: [id number] [facility] Gastroenterology Progress Note  Phillip Maldonado 62 y.o. 24-Feb-1951   Subjective: Passing dark stools. Hgb dropped to 5.7. Reports abdominal pain.  Objective: Vital signs in last 24 hours: Filed Vitals:   01/24/13 1405  BP: 111/79  Pulse: 116  Temp: 97.7 F (36.5 C)  Resp: 20    Physical Exam: Gen: awake, alert, no acute distress, morbidly obese Abd: epigastric tenderness with guarding, soft, nondistended  Lab Results:  Recent Labs  01/23/13 0205 01/23/13 1025  NA 138 142  K 4.1 4.1  CL 98 105  CO2 25 30  GLUCOSE 268* 200*  BUN 35* 35*  CREATININE 0.92 0.93  CALCIUM 8.3* 7.8*    Recent Labs  01/23/13 0205 01/23/13 1025  AST 27 25  ALT 24 22  ALKPHOS 55 49  BILITOT 0.3 0.3  PROT 5.6* 5.5*  ALBUMIN 2.6* 2.5*    Recent Labs  01/23/13 0205  01/24/13 0711 01/24/13 0847  WBC 13.4*  < > 7.7 8.0  NEUTROABS 9.5*  --   --   --   HGB 6.5*  < > 6.1* 5.7*  HCT 20.5*  < > 19.3* 18.1*  MCV 91.1  < > 90.2 89.2  PLT 353  < > 228 248  < > = values in this interval not displayed.  Recent Labs  01/23/13 0247  LABPROT 14.6  INR 1.16      Assessment/Plan: 62 yo with GI bleed of unclear source. UGIS negative for perforation or ulcer. Evidence of recurrent bleeding. Recommend RBC scan next and if positive then may need embolization by IR instead of repeat EGD. Hospitalist PA/NP aware of recs and will order RBC scan.   Fabyan Loughmiller C. 01/24/2013, 2:22 PM

## 2013-01-24 NOTE — Progress Notes (Addendum)
Anders Simmonds PA at bedside for central line insertion.

## 2013-01-24 NOTE — Progress Notes (Signed)
Patient ID: Phillip Maldonado, male   DOB: December 17, 1950, 62 y.o.   MRN: [id number] 1 Day Post-Op  Subjective: Pt c/o crampy, gas pains today.  Doesn't know if he has had any other melanotic stools since yesterday.  He c/o pain and wants to get out of bed  Objective: Vital signs in last 24 hours: Temp:  [97.5 F (36.4 C)-98.1 F (36.7 C)] 97.5 F (36.4 C) (11/11 0429) Pulse Rate:  [91-111] 95 (11/11 0429) Resp:  [12-23] 16 (11/11 0429) BP: (104-153)/(44-70) 104/44 mmHg (11/11 0429) SpO2:  [91 %-100 %] 99 % (11/11 0429) Weight:  [400 lb 12.7 oz (181.8 kg)-451 lb 15.1 oz (205 kg)] 451 lb 15.1 oz (205 kg) (11/11 0429) Last BM Date: 01/23/13  Intake/Output from previous day: 11/10 0701 - 11/11 0700 In: 3435 [P.O.:360; I.V.:2775; Blood:300] Out: 3250 [Urine:3250] Intake/Output this shift:    PE: Abd: soft, mild tenderness, +BS, ND, obese Heart: regular, occasional PVC  Lab Results:   Recent Labs  01/23/13 1025 01/23/13 1700  WBC 10.8* 11.6*  HGB 7.1* 7.6*  HCT 21.4* 23.8*  PLT 254 263   BMET  Recent Labs  01/23/13 0205 01/23/13 1025  NA 138 142  K 4.1 4.1  CL 98 105  CO2 25 30  GLUCOSE 268* 200*  BUN 35* 35*  CREATININE 0.92 0.93  CALCIUM 8.3* 7.8*   PT/INR  Recent Labs  01/23/13 0247  LABPROT 14.6  INR 1.16   CMP     Component Value Date/Time   NA 142 01/23/2013 1025   K 4.1 01/23/2013 1025   CL 105 01/23/2013 1025   CO2 30 01/23/2013 1025   GLUCOSE 200* 01/23/2013 1025   BUN 35* 01/23/2013 1025   CREATININE 0.93 01/23/2013 1025   CALCIUM 7.8* 01/23/2013 1025   PROT 5.5* 01/23/2013 1025   ALBUMIN 2.5* 01/23/2013 1025   AST 25 01/23/2013 1025   ALT 22 01/23/2013 1025   ALKPHOS 49 01/23/2013 1025   BILITOT 0.3 01/23/2013 1025   GFRNONAA 89* 01/23/2013 1025   GFRAA >90 01/23/2013 1025   Lipase     Component Value Date/Time   LIPASE 25 01/23/2013 0205       Studies/Results: Dg Abd 1 View  01/23/2013   CLINICAL DATA:  Abdominal pain   EXAM: ABDOMEN - 1 VIEW  COMPARISON:  Of prior radiograph from 05/08/2011  FINDINGS: The visualized bowel gas pattern is nonspecific. No dilated loops of bowel to suggest obstruction or ileus are identified. No soft tissue masses identified. No free intraperitoneal air identified. No abnormal bowel wall thickening appreciated. Several ovoid calcific densities overlying the lateral left abdomen likely lie in the external soft tissues, stable as compared to the prior exam. Multiple surgical clips overlie the left upper quadrant.  Spinal fixation hardware again noted within the lower spine.  IMPRESSION: Nonobstructive bowel gas pattern with no acute intra-abdominal process identified. .   Electronically Signed   By: Rise Mu M.D.   On: 01/23/2013 05:41   Dg Chest Portable 1 View  01/23/2013   CLINICAL DATA:  Right-sided line placement  EXAM: PORTABLE CHEST - 1 VIEW  COMPARISON:  Prior radiograph from 01/16/2013  FINDINGS: Cardiac and mediastinal silhouettes are unchanged.  Lungs are hypoinflated. No focal infiltrate, pulmonary edema, or pleural effusion is identified. There is no pneumothorax.  Fixation hardware overlying the cervical spine is unchanged. No acute osseous abnormality.  IMPRESSION: 1. Stable appearance of the chest with shallow lung inflation. No pneumothorax status post attempted line  placement. 2. No central line identified within the thorax. These results were called by telephone at the time of interpretation on 01/23/2013 at 3:56 AM to Dr.JULIE MANLY , who verbally acknowledged these results.   Electronically Signed   By: Rise Mu M.D.   On: 01/23/2013 03:57    Anti-infectives: Anti-infectives   None       Assessment/Plan  1. UGI bleed 2. S/p some type of bariatric surgery in the 70s with revision in the 80s.   Patient Active Problem List   Diagnosis Date Noted  . Acute blood loss anemia 01/02/2013  . History of gastric bypass 01/02/2013  . UGIB (upper  gastrointestinal bleed) 01/01/2013  . HTN (hypertension) 09/18/2011  . Diabetes mellitus 09/18/2011  . Hypercholesterolemia 09/18/2011  . OSA (obstructive sleep apnea) 09/18/2011  . Chest pain 05/09/2011  . Hypokalemia 05/09/2011  . Constipation 05/09/2011   Plan: 1. The patient's anatomy is not clear at all since he has had bariatric surgery.  One endo reveals an efferent and afferent limb, but two others do now show this at all.  Otherwise, it appears he has sutures that are thought to be located in cardia of the stomach.  This may be from a vertical banding gastroplasty.  We will obtain an UGI today to determine anatomy.  Without knowing anatomy, if he require an operation, this would be incredibly difficult.  Depending on anatomy etc, he is not completely precluded from embolization if needed.  Will continue to follow along.  LOS: 1 day    Lauralynn Loeb E 01/24/2013, 7:44 AM Pager: [phone]

## 2013-01-25 DIAGNOSIS — E876 Hypokalemia: Secondary | ICD-10-CM

## 2013-01-25 DIAGNOSIS — D649 Anemia, unspecified: Secondary | ICD-10-CM

## 2013-01-25 DIAGNOSIS — I359 Nonrheumatic aortic valve disorder, unspecified: Secondary | ICD-10-CM

## 2013-01-25 LAB — TYPE AND SCREEN
ABO/RH(D): O POS
Unit division: 0
Unit division: 0

## 2013-01-25 LAB — BASIC METABOLIC PANEL
CO2: 20 mEq/L (ref 19–32)
Calcium: 7.8 mg/dL — ABNORMAL LOW (ref 8.4–10.5)
Chloride: 102 mEq/L (ref 96–112)
Creatinine, Ser: 0.61 mg/dL (ref 0.50–1.35)
GFR calc Af Amer: >90 mL/min (ref >90)
Sodium: 134 mEq/L — ABNORMAL LOW (ref 135–145)

## 2013-01-25 LAB — GLUCOSE, CAPILLARY
Glucose-Capillary: 154 mg/dL — ABNORMAL HIGH (ref 70–99)
Glucose-Capillary: 150 mg/dL — ABNORMAL HIGH (ref 70–99)
Glucose-Capillary: 201 mg/dL — ABNORMAL HIGH (ref 70–99)
Glucose-Capillary: 152 mg/dL — ABNORMAL HIGH (ref 70–99)

## 2013-01-25 LAB — CBC
HCT: 37.5 % — ABNORMAL LOW (ref 39.0–52.0)
Platelets: 135 10*3/uL — ABNORMAL LOW (ref 150–400)
RBC: 4.2 MIL/uL — ABNORMAL LOW (ref 4.22–5.81)
RDW: 12.9 % (ref 11.5–15.5)
WBC: 5 10*3/uL (ref 4.0–10.5)

## 2013-01-25 MED ORDER — INSULIN ASPART 100 UNIT/ML ~~LOC~~ SOLN: 0.0000 [IU] | Freq: Every day | SUBCUTANEOUS | Status: DC

## 2013-01-25 MED ORDER — INSULIN GLARGINE 100 UNIT/ML ~~LOC~~ SOLN: 20.0000 [IU] | Freq: Every day | SUBCUTANEOUS | Status: DC

## 2013-01-25 MED ORDER — INSULIN ASPART 100 UNIT/ML ~~LOC~~ SOLN
0.0000 [IU] | Freq: Three times a day (TID) | SUBCUTANEOUS | Status: DC
  Administered 2013-01-25: 3 [IU] via SUBCUTANEOUS
  Administered 2013-01-25: 2 [IU] via SUBCUTANEOUS

## 2013-01-25 MED ORDER — INSULIN ASPART 100 UNIT/ML ~~LOC~~ SOLN: 0.0000 [IU] | Freq: Three times a day (TID) | SUBCUTANEOUS | Status: DC

## 2013-01-25 NOTE — Progress Notes (Signed)
Without location of bleeding success of ANY surgical intervention is low and complication/ mortality rate significant.  Supportive care for now in light of normal bleeding scan. Transfuse as needed. High operative risk given multiple medical problems.

## 2013-01-25 NOTE — Clinical Documentation Improvement (Signed)
Possible Clinical Conditions?   _______Diabetes Type   or 2 _______Controlled or uncontrolled  Manifestations:  _______DM retinopathy  _______DM PVD _______DM neuropathy   _______DM nephropathy  Associated conditions: _______DM cellulitis _______DM gangrene _______DM gastroparesis _______DM hyperosmolarity state _______DM ketoacidosis with or without coma _______DM osteomyelitis _______DM skin ulcer  _______Other Condition _______Cannot Clinically determine    Diabetes Type 2 _______Controlled or uncontrolled  Manifestations:  _______DM retinopathy  _______DM PVD _______DM neuropathy   _______DM nephropathy  Associated conditions: _______DM cellulitis _______DM gangrene _______DM gastroparesis _______DM hyperosmolarity state _______DM ketoacidosis with or without coma _______DM osteomyelitis _______DM skin ulcer  _______Other Condition _______Cannot Clinically determine   Risk Factors:  Diabetes mellitus, type 2 noted per 11/10 H&P.  Diagnostics: Lab:11/10: Hgb A1c level Blood glucose:  268 Capillary glucose:  213    Thank You, Marciano Sequin, Clinical Documentation Specialist:  [phone]  [facility]- Health Information Management

## 2013-01-25 NOTE — Progress Notes (Signed)
Echocardiogram 2D Echocardiogram has been performed.  Phillip Maldonado 01/25/2013, 4:56 PM

## 2013-01-25 NOTE — Clinical Social Work Psychosocial (Signed)
Clinical Social Work Department BRIEF PSYCHOSOCIAL ASSESSMENT 01/25/2013  Patient:  Phillip Maldonado, Phillip Maldonado     Account Number:  [id number]     Admit date:  01/23/2013  Clinical Social Worker:  Varney Biles  Date/Time:  01/25/2013 01:09 PM  Referred by:  Physician  Date Referred:  01/25/2013 Referred for  SNF Placement   Other Referral:   Interview type:  Patient Other interview type:   CSW also spoke with pt's sister Phillip Maldonado, who was at bedside.    PSYCHOSOCIAL DATA Living Status:  ALONE Admitted from facility:   Level of care:   Primary support name:  Vivianne Spence ([phone]) Primary support relationship to patient:  SIBLING Degree of support available:   Good--pt's sister was at bedside and provides support to pt.    CURRENT CONCERNS Current Concerns  Post-Acute Placement   Other Concerns:    SOCIAL WORK ASSESSMENT / PLAN CSW explained role and pt said he had been to two SNFs before and had bad experiences. CSW expressed that she is sorry to hear that, and asked if there are any facilities pt might like CSW to look into for a potential bed offer. Pt and [address] listed [facility], [address], [address], [facility], and [facility] in [place]. CSW will send clinicals to these facilities once PT evaluates pt. Pt expressed anxiety about going to SNF, as he felt the care he received at the two facilities he has been to before was inadequate. CSW also expressed that it is pt's right to refuse SNF and that she can set pt up with home health, via RNCM. Pt says he needs to go to SNF, though he does not want to. CSW will submit clinicals to the facilities pt & Phillip Maldonado chose and will expand search if none of these are able to offer bed.   Assessment/plan status:  Psychosocial Support/Ongoing Assessment of Needs Other assessment/ plan:   Information/referral to community resources:   SNF    PATIENT'S/FAMILY'S RESPONSE TO PLAN OF CARE: Pt and sister Phillip Maldonado receptive to  CSW assistance.       Maryclare Labrador, MSW, [facility] Clinical Social Worker [phone]

## 2013-01-25 NOTE — Progress Notes (Signed)
Patient ID: Phillip Maldonado, male   DOB: Jan 25, 1951, 62 y.o.   MRN: [id number] 2 Days Post-Op  Subjective: Pt c/o nothing but how uncomfortable he is in his bed.  No further abdominal pain and no further melena since yesterday  Objective: Vital signs in last 24 hours: Temp:  [97.7 F (36.5 C)-98.6 F (37 C)] 98.2 F (36.8 C) (11/12 0448) Pulse Rate:  [74-118] 107 (11/12 0448) Resp:  [17-30] 30 (11/12 0448) BP: (99-144)/(44-87) 144/51 mmHg (11/12 0448) SpO2:  [96 %-100 %] 97 % (11/12 0448) Last BM Date: 01/24/13  Intake/Output from previous day: 11/11 0701 - 11/12 0700 In: 801 [I.V.:50; Blood:751] Out: 2575 [Urine:2575] Intake/Output this shift:    PE: Abd: soft, NT, ND, obese, +BS Heart: irregular  Lab Results:   Recent Labs  01/24/13 0847 01/25/13 0500  WBC 8.0 5.0  HGB 5.7* 13.3  HCT 18.1* 37.5*  PLT 248 135*   BMET  Recent Labs  01/24/13 1019 01/25/13 0500  NA 142 134*  K 4.0 3.9  CL 111 102  CO2 23 20  GLUCOSE 151* 136*  BUN 15 7  CREATININE 0.79 0.61  CALCIUM 8.1* 7.8*   PT/INR  Recent Labs  01/23/13 0247  LABPROT 14.6  INR 1.16   CMP     Component Value Date/Time   NA 134* 01/25/2013 0500   K 3.9 01/25/2013 0500   CL 102 01/25/2013 0500   CO2 20 01/25/2013 0500   GLUCOSE 136* 01/25/2013 0500   BUN 7 01/25/2013 0500   CREATININE 0.61 01/25/2013 0500   CALCIUM 7.8* 01/25/2013 0500   PROT 5.5* 01/23/2013 1025   ALBUMIN 2.5* 01/23/2013 1025   AST 25 01/23/2013 1025   ALT 22 01/23/2013 1025   ALKPHOS 49 01/23/2013 1025   BILITOT 0.3 01/23/2013 1025   GFRNONAA >90 01/25/2013 0500   GFRAA >90 01/25/2013 0500   Lipase     Component Value Date/Time   LIPASE 25 01/23/2013 0205       Studies/Results: Nm Gi Blood Loss  01/24/2013   CLINICAL DATA:  GI bleed/melena  EXAM: NUCLEAR MEDICINE GASTROINTESTINAL BLEEDING SCAN  TECHNIQUE: Sequential abdominal images were obtained following intravenous administration of Tc-54m labeled red  blood cells.  COMPARISON:  None.  RADIOPHARMACEUTICALS:  25 mCi Tc-28m in-vitro labeled red cells.  FINDINGS: Patient was imaged for 120 min.  No suspicious activity is seen to suggest a GI bleed.  IMPRESSION: No scintigraphic evidence of a GI bleed.   Electronically Signed   By: Charline Bills M.D.   On: 01/24/2013 18:14   Dg Chest Port 1 View  01/24/2013   CLINICAL DATA:  Central line placement.  EXAM: PORTABLE CHEST - 1 VIEW  COMPARISON:  01/23/2013.  FINDINGS: Right internal jugular catheter is in place with the tip at the level of the mid to distal superior vena cava. No gross pneumothorax.  Cardiomegaly.  Pulmonary vascular congestion/ pulmonary edema.  Postsurgical changes cervical spine upper thoracic spine and left upper quadrant.  IMPRESSION: Right internal jugular catheter is in place with the tip at the level of the mid to distal superior vena cava. No gross pneumothorax.  Cardiomegaly.  Pulmonary vascular congestion/ pulmonary edema.   Electronically Signed   By: Bridgett Larsson M.D.   On: 01/24/2013 12:27   Dg Kayleen Memos W/water Sol Cm  01/24/2013   CLINICAL DATA:  Anemia. GI bleed. History of gastric bypass surgery.  EXAM: WATER SOLUBLE UPPER GI SERIES  TECHNIQUE: Single-column upper GI series was performed  using water soluble contrast.  CONTRAST:  75 cc Water-soluble contrast  COMPARISON:  None.  FLUOROSCOPY TIME:  1 min and 51 seconds  FINDINGS: Limited study due to the patient's size and immobility.  Initial swallows demonstrate normal appearance of the esophagus. Extensive surgical changes are noted near the GE junction an but no hiatal hernia is identified. The stomach is narrowing may be partially stapled but no leak or obvious mass or large ulcer. Contrast enters the duodenum without difficulty in the duodenum bulb and C-loop appear normal.  IMPRESSION: Postoperative changes involving the stomach but no findings for leaking contrast, obstruction, mass or large ulcer.   Electronically Signed    By: Loralie Champagne M.D.   On: 01/24/2013 10:17    Anti-infectives: Anti-infectives   None       Assessment/Plan  1. UGI bleed, source unclear 2. H/o unknown gastric bypass study 3. Morbid obesity 4. New onset A fib with RVR  Plan: 1. Agree with GI that the patient would benefit from repeat endoscopy within the next 1-2 days to try and re-evaluate a bleeding source.  It makes it difficult to know where to operate right now due to deranged anatomy and the possible source ranging from cardia to duodenum. 2. Will ask RN to talk to the primary service to see if he can at least get off of bedrest and get up to a chair to make him more comfortable. 3. Cont to follow labs.  Hopefully, for now, he has stopped bleeding.  Will continue to monitor.   LOS: 2 days    Daviona Herbert E 01/25/2013, 8:15 AM Pager: [phone]

## 2013-01-25 NOTE — Progress Notes (Signed)
Utilization Review Completed.  

## 2013-01-25 NOTE — Progress Notes (Signed)
[facility] Progress Note [place] TEAM 1 - Stepdown ICU Team   Phillip Maldonado ZOX:[id number] DOB: 08-15-1950 DOA: 01/23/2013 PCP: No PCP Per Patient  Brief narrative: 62 y.o. male who was recently admitted 3 weeks ago for acute GI bleed. EGD at that time was unrevealing-questioned a  dieulfoy lesion.. After discharge he did well until 24 hours prior to [facility] when he started experiencing abdominal pain followed by a large black-colored bowel movement. Patient became very dizzy and weak. EMS was called. When he tried to walk the patient had a syncopal episode with apparent LOC. Patient was brought to the ER where he was found to be hypotensive. Blood pressure improved with normal saline bolus. Hemoglobin was found to be around 6.5 drop-3 gm lower than dc reading.  Assessment/Plan:    UGIB (upper gastrointestinal bleed) -GI consulted-underwent EGD 11/10: large maroon clot seen without actual source of bleeding clarified-surgery consulted- UGIS done to better clarify anatomy -?? occult bleeding ON 11/11 as evidenced by decreasing hgb and 11/12 hgb up to 13 from 5.7 but only received 2 units PRBCs 11/11 so question accuracy of lab (higher lab)- will follow -NM Bleeding scan 11/11 was unrevealing -cont PPI gtt -GI allowing clears -likely will pursue follow up EGD in 1-2 days -due to location likely cannot embolize    Acute blood loss anemia -Initial hgb nadir 6.5 and briefly up to 7.6 but this am trend back down to 5.7 -poor IV access so emergent CL placed per PCCM -2 units PRBCs today for total of 4 since admit -see above   Atrial Fibrillation/RVR -seems exacerbated by ABL anemia and deconditioning -EKG/Cardizem gtt -IV Amiodarone if developes hypotension    HTN -home meds on hold    History "CHF" -diuretics on hold -no prior ECHO and now with AF need ECHO this admit -pt cardiologist is Ganji and pt says > 1 year since last ECHO    Diabetes mellitus -Lantus started  last admit-resume when on solid diet -controlled here    OSA (obstructive sleep apnea) -HS CPAP    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   Shoulder pain -on methadone pre admit with Neurontin    Depression/deconditioning -on Effexor, Trazadone, and Pristiq -d/w sister and POA- agrees pt not appropriate to return home alone -PT/OT eval -SW to aid in process for Medicaid vs disability and possible ALF placement vs rehab -psych SW eval for ongoing depression   DVT prophylaxis: SCDs Code Status: Full Family Communication: Patient Disposition Plan/Expected LOS: Stepdown   Consultants: Gastroenterology Surgery  Procedures: EGD 11/10  Antibiotics: None  HPI/Subjective: Patient awake- wants foley out- c/o SOB with activity  Objective: Blood pressure 157/79, pulse 53, temperature 98.1 F (36.7 C), temperature source Oral, resp. rate 18, height 5\' 11"  (1.803 m), weight 451 lb 15.1 oz (205 kg), SpO2 95.00%.  Intake/Output Summary (Last 24 hours) at 01/25/13 1056 Last data filed at 01/25/13 0449  Gross per 24 hour  Intake    751 ml  Output   2275 ml  Net  -1524 ml   Exam: General: No acute respiratory distress Lungs: Coarse to auscultation bilaterally without wheezes or crackles, [place] oxygen Cardiovascular: Irregular rate and  (AF) rhythm without murmur gallop or rub normal S1 and S2, 1+ peripheral lower extremity edema without JVD Abdomen: Nontender, nondistended, soft, bowel sounds positive, no rebound, no ascites, no appreciable mass Musculoskeletal: No significant cyanosis, clubbing of bilateral lower extremities Neurological: Alert and oriented  x 3, moves all extremities x 4 without focal neurological deficits, CN 2-12 intact  Scheduled Meds:  Scheduled Meds: . insulin aspart  0-5 Units Subcutaneous QHS  . insulin aspart  0-9 Units Subcutaneous TID WC  . pantoprazole (PROTONIX) IV  80 mg Intravenous Once  . [START ON  01/26/2013] pantoprazole (PROTONIX) IV  40 mg Intravenous Q12H  . sodium chloride  3 mL Intravenous Q12H   Data Reviewed: Basic Metabolic Panel:  Recent Labs Lab 01/23/13 0205 01/23/13 1025 01/24/13 1019 01/25/13 0500  NA 138 142 142 134*  K 4.1 4.1 4.0 3.9  CL 98 105 111 102  CO2 25 30 23 20   GLUCOSE 268* 200* 151* 136*  BUN 35* 35* 15 7  CREATININE 0.92 0.93 0.79 0.61  CALCIUM 8.3* 7.8* 8.1* 7.8*   Liver Function Tests:  Recent Labs Lab 01/23/13 0205 01/23/13 1025  AST 27 25  ALT 24 22  ALKPHOS 55 49  BILITOT 0.3 0.3  PROT 5.6* 5.5*  ALBUMIN 2.6* 2.5*   CBC:  Recent Labs Lab 01/23/13 0205 01/23/13 1025 01/23/13 1700 01/24/13 0711 01/24/13 0847 01/25/13 0500  WBC 13.4* 10.8* 11.6* 7.7 8.0 5.0  NEUTROABS 9.5*  --   --   --   --   --   HGB 6.5* 7.1* 7.6* 6.1* 5.7* 13.3  HCT 20.5* 21.4* 23.8* 19.3* 18.1* 37.5*  MCV 91.1 87.7 89.5 90.2 89.2 89.3  PLT 353 254 263 228 248 135*   CBG:  Recent Labs Lab 01/24/13 1851 01/24/13 2018 01/24/13 2342 01/25/13 0446 01/25/13 0820  GLUCAP 147* 146* 159* 154* 150*    Recent Results (from the past 240 hour(s))  MRSA PCR SCREENING     Status: None   Collection Time    01/23/13  5:51 AM      Result Value Range Status   MRSA by PCR NEGATIVE  NEGATIVE Final   Comment:            The GeneXpert MRSA Assay (FDA     approved for NASAL specimens     only), is one component of a     comprehensive MRSA colonization     surveillance program. It is not     intended to diagnose MRSA     infection nor to guide or     monitor treatment for     MRSA infections.     Studies:  Recent x-ray studies have been reviewed in detail by the Attending Physician     Junious Silk, ANP [facility] Office  [phone] Pager [phone]  **If unable to reach the above provider after paging please contact the Flow Manager @ [phone]  On-Call/Text Page:      [email or web address]      password [email or web address]  If 7PM-7AM, please  contact night-coverage [email or web address] Password [email or web address] 01/25/2013, 10:56 AM   LOS: 2 days   I have personally examined the patient and reviewed the entire database. Agree with the above note and plan as outlined, any necessary changes made.  Dontee Jaso M.D. [facility] 01/25/2013, 1:27 PM Pager: [phone]

## 2013-01-25 NOTE — Progress Notes (Signed)
Subjective: No further reported bleeding. Is uncomfortable and can't sleep in his current bed-type.  Objective: Vital signs in last 24 hours: Temp:  [97.7 F (36.5 C)-98.6 F (37 C)] 98.2 F (36.8 C) (11/12 0448) Pulse Rate:  [74-118] 107 (11/12 0448) Resp:  [15-30] 30 (11/12 0448) BP: (99-144)/(44-88) 144/51 mmHg (11/12 0448) SpO2:  [96 %-100 %] 97 % (11/12 0448) Weight change:  Last BM Date: 01/24/13  PE: GEN:  Obese, uncomfortable with the Kin-Air bed. ABD:  Protuberant, soft, non-tender  Lab Results: CBC    Component Value Date/Time   WBC 5.0 01/25/2013 0500   RBC 4.20* 01/25/2013 0500   HGB 13.3 01/25/2013 0500   HCT 37.5* 01/25/2013 0500   PLT 135* 01/25/2013 0500   MCV 89.3 01/25/2013 0500   MCH 31.7 01/25/2013 0500   MCHC 35.5 01/25/2013 0500   RDW 12.9 01/25/2013 0500   LYMPHSABS 3.3 01/23/2013 0205   MONOABS 0.5 01/23/2013 0205   EOSABS 0.1 01/23/2013 0205   BASOSABS 0.0 01/23/2013 0205   Studies/Results: UGI series unrevealing. Tagged RBC study showed no source of bleeding.  Assessment:  1.  Recurrent obscure overt upper GI bleed.  Has had three endoscopies, two of which had too much blood to visualize much, the third of which showed some clot around his gastric cardia suture material but without clear source of the bleeding. 2.  Acute blood loss anemia.  Plan:  1.  Supportive care with volume repletion, serial CBCs, PPI. 2.  Despite his having several prior endoscopies, the source of his bleeding is unclear.  The bleeding, when it happens, is significant and thus trying to figure out the source is important.  Other ancillary studies (UGI, tagged RBC study) have not helped [place].  With all this in mind, I have advised that we do yet another endoscopy in the next day or two (allowing time for all the previous clot to clear his stomach).  Patient, however, does not want to do this.  Patient's biggest concern at this time is the discomfort with his current  bed. 3.  Will revisit tomorrow.   Freddy Jaksch 01/25/2013, 7:56 AM

## 2013-01-26 DIAGNOSIS — I10 Essential (primary) hypertension: Secondary | ICD-10-CM

## 2013-01-26 LAB — BASIC METABOLIC PANEL
Calcium: 8.4 mg/dL (ref 8.4–10.5)
Chloride: 108 mEq/L (ref 96–112)
GFR calc Af Amer: >90 mL/min (ref >90)
GFR calc non Af Amer: >90 mL/min (ref >90)
Glucose, Bld: 180 mg/dL — ABNORMAL HIGH (ref 70–99)
Potassium: 3.7 mEq/L (ref 3.5–5.1)
Sodium: 139 mEq/L (ref 135–145)

## 2013-01-26 LAB — CBC
Hemoglobin: 7.4 g/dL — ABNORMAL LOW (ref 13.0–17.0)
MCHC: 32 g/dL (ref 30.0–36.0)
Platelets: 239 10*3/uL (ref 150–400)
RDW: 16.1 % — ABNORMAL HIGH (ref 11.5–15.5)
WBC: 8.7 10*3/uL (ref 4.0–10.5)

## 2013-01-26 LAB — GLUCOSE, CAPILLARY
Glucose-Capillary: 157 mg/dL — ABNORMAL HIGH (ref 70–99)
Glucose-Capillary: 191 mg/dL — ABNORMAL HIGH (ref 70–99)
Glucose-Capillary: 159 mg/dL — ABNORMAL HIGH (ref 70–99)

## 2013-01-26 MED ORDER — METHADONE HCL 5 MG PO TABS
10.0000 mg | ORAL_TABLET | Freq: Three times a day (TID) | ORAL | Status: DC
  Administered 2013-01-26 – 2013-01-30 (×12): 10 mg via ORAL
  Filled 2013-01-26 (×13): qty 2

## 2013-01-26 MED ORDER — PANTOPRAZOLE SODIUM 40 MG PO TBEC
40.0000 mg | DELAYED_RELEASE_TABLET | Freq: Two times a day (BID) | ORAL | Status: DC
  Administered 2013-01-26 – 2013-01-27 (×2): 40 mg via ORAL
  Filled 2013-01-26 (×2): qty 1

## 2013-01-26 MED ORDER — LORAZEPAM 2 MG/ML IJ SOLN
1.0000 mg | Freq: Four times a day (QID) | INTRAMUSCULAR | Status: DC | PRN
  Administered 2013-01-26: 1 mg via INTRAVENOUS
  Filled 2013-01-26: qty 1

## 2013-01-26 MED ORDER — AMLODIPINE BESYLATE 10 MG PO TABS
10.0000 mg | ORAL_TABLET | Freq: Every day | ORAL | Status: DC
  Administered 2013-01-26 – 2013-01-30 (×4): 10 mg via ORAL
  Filled 2013-01-26 (×5): qty 1

## 2013-01-26 MED ORDER — INSULIN ASPART 100 UNIT/ML ~~LOC~~ SOLN: 0.0000 [IU] | Freq: Three times a day (TID) | SUBCUTANEOUS | Status: DC

## 2013-01-26 MED ORDER — METOPROLOL TARTRATE 50 MG PO TABS
50.0000 mg | ORAL_TABLET | Freq: Two times a day (BID) | ORAL | Status: DC
  Administered 2013-01-26 – 2013-01-30 (×8): 50 mg via ORAL
  Filled 2013-01-26 (×10): qty 1

## 2013-01-26 MED ORDER — INSULIN ASPART 100 UNIT/ML ~~LOC~~ SOLN
0.0000 [IU] | Freq: Three times a day (TID) | SUBCUTANEOUS | Status: DC
  Administered 2013-01-26 (×3): 2 [IU] via SUBCUTANEOUS
  Administered 2013-01-27: 1 [IU] via SUBCUTANEOUS

## 2013-01-26 MED ORDER — SODIUM CHLORIDE 0.9 % IJ SOLN
10.0000 mL | Freq: Two times a day (BID) | INTRAMUSCULAR | Status: DC
  Administered 2013-01-26 – 2013-01-30 (×8): 10 mL via INTRAVENOUS

## 2013-01-26 MED ORDER — VENLAFAXINE HCL ER 150 MG PO CP24
150.0000 mg | ORAL_CAPSULE | Freq: Every day | ORAL | Status: DC
  Administered 2013-01-26 – 2013-01-30 (×4): 150 mg via ORAL
  Filled 2013-01-26 (×5): qty 1

## 2013-01-26 MED ORDER — TRAZODONE HCL 150 MG PO TABS
300.0000 mg | ORAL_TABLET | Freq: Every day | ORAL | Status: DC
  Administered 2013-01-26: 300 mg via ORAL
  Filled 2013-01-26 (×2): qty 2

## 2013-01-26 NOTE — Progress Notes (Signed)
[facility] Progress Note [place] TEAM 1 - Stepdown ICU Team   Susana Gripp ZOX:[id number] DOB: 1950/09/14 DOA: 01/23/2013 PCP: No PCP Per Patient  Brief narrative: 62 y.o. male who was recently admitted 3 weeks ago for acute GI bleed. EGD at that time was unrevealing-questioned a  dieulfoy lesion.. After discharge he did well until 24 hours prior to [facility] when he started experiencing abdominal pain followed by a large black-colored bowel movement. Patient became very dizzy and weak. EMS was called. When he tried to walk the patient had a syncopal episode with apparent LOC. Patient was brought to the ER where he was found to be hypotensive. Blood pressure improved with normal saline bolus. Hemoglobin was found to be around 6.5 drop-3 gm lower than dc reading.  Assessment/Plan:    UGIB (upper gastrointestinal bleed) -GI consulted-underwent EGD 11/10: large maroon clot seen without actual source of bleeding clarified-surgery consulted- UGIS done to better clarify anatomy -?? occult bleeding ON 11/11 as evidenced by decreasing hgb and 11/12 hgb up to 13 from 5.7 but only received 2 units PRBCs 11/11 so question accuracy of lab (higher lab)- will follow -NM Bleeding scan 11/11 was unrevealing -cont PPI  -GI allowing clears -EGD planned for 11/14- per GI :"If this again doesn't yield source of bleeding, one would have to assume the most likely bleeding culprit is a Dieulafoy lesion. And, in this case, there is little else for [place] to do, except manage expectantly and pursue repeat endoscopy when/if he rebleeds, understanding that expedited endoscopy may also be likewise unsuccessful in yielding bleeding site (which has been the case on a couple of occasions already)."     Acute blood loss anemia -Initial hgb nadir 6.5 and briefly up to 7.6 but this am trend back down to 5.7 -poor IV access so emergent CL placed per PCCM -4 units PRBCs since admit -Hgb 11/12 was 13 and likely not  accurate   Atrial Fibrillation/RVR -seemed exacerbated by ABL anemia and deconditioning -EKG/Cardizem gtt used and has converted to NSR/ST -Consider change home Norvasc to CCB    HTN -home meds resumed 11/13    History "CHF" -diuretics on hold -ECHO shows preserved LV function with bi atrial dilatation    Diabetes mellitus -Lantus started last admit-resume when on solid diet -controlled here with SSI    OSA (obstructive sleep apnea) -HS CPAP    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   Shoulder pain -on methadone pre admit with Neurontin    Depression/deconditioning/Chronic pain -resume Methadone -on Effexor, Trazadone, and Pristiq -d/w sister and POA- agrees pt not appropriate to return home alone -PT/OT eval -SW to aid in process for Medicaid vs disability and possible ALF placement vs rehab -psych SW eval for ongoing depression   DVT prophylaxis: SCDs Code Status: Full Family Communication: Patient Disposition Plan/Expected LOS: Stepdown   Consultants: Gastroenterology Surgery  Procedures: EGD 11/10  2D ECHO  Antibiotics: None  HPI/Subjective: Patient awake- wants foley out- c/o SOB with activity  Objective: Blood pressure 125/62, pulse 81, temperature 98.6 F (37 C), temperature source Oral, resp. rate 18, height 5\' 11"  (1.803 m), weight 451 lb 15.1 oz (205 kg), SpO2 97.00%.  Intake/Output Summary (Last 24 hours) at 01/26/13 1438 Last data filed at 01/26/13 1000  Gross per 24 hour  Intake  732.5 ml  Output    725 ml  Net    7.5 ml   Exam: General: No acute respiratory  distress Lungs: Coarse to auscultation bilaterally without wheezes or crackles, [place] oxygen Cardiovascular: Irregular rate and  (AF) rhythm without murmur gallop or rub normal S1 and S2, 1+ peripheral lower extremity edema without JVD Abdomen: Nontender, nondistended, soft, bowel sounds positive, no rebound, no ascites, no appreciable  mass Musculoskeletal: No significant cyanosis, clubbing of bilateral lower extremities Neurological: Alert and oriented x 3, moves all extremities x 4 without focal neurological deficits, CN 2-12 intact  Scheduled Meds:  Scheduled Meds: . amLODipine  10 mg Oral Daily  . insulin aspart  0-5 Units Subcutaneous QHS  . insulin aspart  0-9 Units Subcutaneous TID WC  . methadone  10 mg Oral TID  . metoprolol  50 mg Oral BID  . pantoprazole  40 mg Oral BID AC  . sodium chloride  10 mL Intravenous Q12H  . traZODone  300 mg Oral QHS  . venlafaxine XR  150 mg Oral Daily   Data Reviewed: Basic Metabolic Panel:  Recent Labs Lab 01/23/13 0205 01/23/13 1025 01/24/13 1019 01/25/13 0500 01/26/13 0551  NA 138 142 142 134* 139  K 4.1 4.1 4.0 3.9 3.7  CL 98 105 111 102 108  CO2 25 30 23 20 20   GLUCOSE 268* 200* 151* 136* 180*  BUN 35* 35* 15 7 9   CREATININE 0.92 0.93 0.79 0.61 0.77  CALCIUM 8.3* 7.8* 8.1* 7.8* 8.4   Liver Function Tests:  Recent Labs Lab 01/23/13 0205 01/23/13 1025  AST 27 25  ALT 24 22  ALKPHOS 55 49  BILITOT 0.3 0.3  PROT 5.6* 5.5*  ALBUMIN 2.6* 2.5*   CBC:  Recent Labs Lab 01/23/13 0205  01/23/13 1700 01/24/13 0711 01/24/13 0847 01/25/13 0500 01/26/13 0551  WBC 13.4*  < > 11.6* 7.7 8.0 5.0 8.7  NEUTROABS 9.5*  --   --   --   --   --   --   HGB 6.5*  < > 7.6* 6.1* 5.7* 13.3 7.4*  HCT 20.5*  < > 23.8* 19.3* 18.1* 37.5* 23.1*  MCV 91.1  < > 89.5 90.2 89.2 89.3 89.2  PLT 353  < > 263 228 248 135* 239  < > = values in this interval not displayed. CBG:  Recent Labs Lab 01/25/13 1218 01/25/13 1652 01/25/13 2111 01/26/13 0818 01/26/13 1305  GLUCAP 201* 171* 152* 157* 191*    Recent Results (from the past 240 hour(s))  MRSA PCR SCREENING     Status: None   Collection Time    01/23/13  5:51 AM      Result Value Range Status   MRSA by PCR NEGATIVE  NEGATIVE Final   Comment:            The GeneXpert MRSA Assay (FDA     approved for NASAL  specimens     only), is one component of a     comprehensive MRSA colonization     surveillance program. It is not     intended to diagnose MRSA     infection nor to guide or     monitor treatment for     MRSA infections.     Studies:  Recent x-ray studies have been reviewed in detail by the Attending Physician     Junious Silk, ANP [facility] Office  [phone] Pager [phone]  **If unable to reach the above provider after paging please contact the Flow Manager @ [phone]  On-Call/Text Page:      [email or web address]      password [facility]  If 7PM-7AM, please contact night-coverage [email or web address] Password [facility] 01/26/2013, 2:38 PM   LOS: 3 days   I have examined the patient, reviewed the chart and modified the above note which I agree with.   Okey Zelek,MD [phone] 01/26/2013, 4:53 PM

## 2013-01-26 NOTE — Progress Notes (Signed)
Physical Therapy Evaluation Patient Details Name: Phillip Maldonado MRN: [id number] DOB: 04-Aug-1950 Today's Date: 01/26/2013 Time: 1010-1037 PT Time Calculation (min): 27 min  PT Assessment / Plan / Recommendation History of Present Illness  Pt admit with upper GIB.    Clinical Impression  Pt admitted with above. Pt currently with functional limitations due to the deficits listed below (see PT Problem List). Feel that SNF would be best as pt fatigues after performing care however pt really wanting to go home with HHPT f/u.  He said [employer] work with him that much.  REcommend a short SNF stay but if pt refuses recommend HHPT at home.   Pt will benefit from skilled PT to increase their independence and safety with mobility to allow discharge to the venue listed below.     PT Assessment  Patient needs continued PT services    Follow Up Recommendations  Home health PT;Supervision - Intermittent;SNF;Other (comment) (pt wants home but SNF for a week would be helpful)        Barriers to Discharge Decreased caregiver support      Equipment Recommendations  None recommended by PT         Frequency Min 3X/week    Precautions / Restrictions Precautions Precautions: Fall Restrictions Weight Bearing Restrictions: No   Pertinent Vitals/Pain VSS, No pain      Mobility  Bed Mobility Bed Mobility: Not assessed Transfers Transfers: Sit to Stand;Stand to Sit Sit to Stand: 4: Min guard;With upper extremity assist;From chair/3-in-1;From toilet Stand to Sit: 4: Min guard;With upper extremity assist;To chair/3-in-1;To toilet Details for Transfer Assistance: min guard for safety and balance Ambulation/Gait Ambulation/Gait Assistance: 4: Min guard Ambulation Distance (Feet): 150 Feet Assistive device: Rolling walker Ambulation/Gait Assistance Details: Pt with flexed posture and not standing as expected with cuing.  Uses RW fairly well with occasional cues for safety.   Needed cues to stay close to RW especially in tight places.   Gait Pattern: Step-through pattern;Decreased step length - right;Decreased step length - left;Shuffle;Trunk flexed Gait velocity: decr Stairs: No Wheelchair Mobility Wheelchair Mobility: No         PT Diagnosis: Difficulty walking;Generalized weakness  PT Problem List: Decreased strength;Decreased activity tolerance;Decreased balance;Decreased mobility PT Treatment Interventions: DME instruction;Gait training;Functional mobility training;Therapeutic activities;Therapeutic exercise;Patient/family education;Balance training     PT Goals(Current goals can be found in the care plan section) Acute Rehab PT Goals Patient Stated Goal: To get stronger so he can eventually get home. PT Goal Formulation: With patient Time For Goal Achievement: 02/02/13 Potential to Achieve Goals: Good  Visit Information  Last PT Received On: 01/26/13 Assistance Needed: +1 History of Present Illness: Pt admit with upper GIB.         Prior Functioning  Home Living Family/patient expects to be discharged to:: Private residence Living Arrangements: Alone Type of Home: Apartment Home Access: Level entry Home Layout: One level Home Equipment: Cane - single point;Walker - 4 wheels (handicapped toilet) Adaptive Equipment: Reacher;Sock aid Additional Comments: Went to [facility] after last admit and stayed 2 weeks and was due to leave and began having chest pain and DM issues therefore pt transported to [facility].   Prior Function Level of Independence: Independent with assistive device(s) Comments: uses cane in house and walker when out in community, Independent with ADLs Communication Communication: No difficulties Dominant Hand: Right    Cognition  Cognition Arousal/Alertness: Awake/alert Behavior During Therapy: Flat affect Overall Cognitive Status: Within Functional Limits for tasks assessed  Extremity/Trunk Assessment  Upper Extremity Assessment Upper Extremity Assessment: RUE deficits/detail RUE Deficits / Details: decreased shoulder AROM approx 40-45 degrees, old shoulder fx in 2011 per pt Lower Extremity Assessment Lower Extremity Assessment: Defer to PT evaluation Cervical / Trunk Assessment Cervical / Trunk Assessment: Normal   Balance Balance Balance Assessed: Yes Static Standing Balance Static Standing - Balance Support: No upper extremity supported Static Standing - Level of Assistance: 5: Stand by assistance Dynamic Standing Balance Dynamic Standing - Balance Support: No upper extremity supported;During functional activity Dynamic Standing - Level of Assistance: 5: Stand by assistance Dynamic Standing - Balance Activities: Other (comment) (peri care) Dynamic Standing - Comments: used RW for static stance  End of Session PT - End of Session Equipment Utilized During Treatment: Gait belt Activity Tolerance: Patient limited by fatigue Patient left: in chair;with call bell/phone within reach Nurse Communication: Mobility status       INGOLD,Skylyn Slezak 01/26/2013, 12:13 PM [facility] Acute Rehabilitation [phone] (pager)

## 2013-01-26 NOTE — Progress Notes (Addendum)
Upon my assessment this AM, I noticed that pt has a 3 lmn RIJ central line. I am not sure when it was inserted because I could not find a note/documentation of it anywhere. Will add LAD with the information received from the patient that this central line was inserted yesterday 11/12.   Addedum: per Dr Molli Knock note dated 11/11 at 11:31, this central line was inserted on the 11/11.

## 2013-01-26 NOTE — Progress Notes (Signed)
Subjective: No bleeding. Tolerating clears.  Objective: Vital signs in last 24 hours: Temp:  [98.1 F (36.7 C)-98.7 F (37.1 C)] 98.5 F (36.9 C) (11/13 0424) Pulse Rate:  [53-98] 98 (11/12 2349) Resp:  [13-28] 19 (11/13 0424) BP: (138-162)/(62-90) 155/66 mmHg (11/13 0424) SpO2:  [95 %-100 %] 97 % (11/13 0424) Weight change:  Last BM Date: 01/25/13  PE: GEN:  Overweight, chronically ill-appearing, NAD NEURO:  A bit agitated and tremulous ABD:  Soft, protuberant  Lab Results: CBC    Component Value Date/Time   WBC 8.7 01/26/2013 0551   RBC 2.59* 01/26/2013 0551   HGB 7.4* 01/26/2013 0551   HCT 23.1* 01/26/2013 0551   PLT 239 01/26/2013 0551   MCV 89.2 01/26/2013 0551   MCH 28.6 01/26/2013 0551   MCHC 32.0 01/26/2013 0551   RDW 16.1* 01/26/2013 0551   LYMPHSABS 3.3 01/23/2013 0205   MONOABS 0.5 01/23/2013 0205   EOSABS 0.1 01/23/2013 0205   BASOSABS 0.0 01/23/2013 0205   Assessment:  1.  Recurrent obscure overt GI bleeding, originating from upper GI tract, seemingly somewhere in the proximal stomach, but exact site unclear.  Bleeding has again stopped.  Plan:  1.  Can advance to clear liquids. 2.  Continue PPI. 3.  Repeat endoscopy tomorrow.  If this again doesn't yield source of bleeding, one would have to assume the most likely bleeding culprit is a Dieulafoy lesion.  And, in this case, there is little else for us to do, except manage expectantly and pursue repeat endoscopy when/if he rebleeds, understanding that expedited endoscopy may also be likewise unsuccessful in yielding bleeding site (which has been the case on a couple of occasions already).   Tavi Hoogendoorn M 01/26/2013, 8:05 AM  

## 2013-01-26 NOTE — Progress Notes (Signed)
Pt requested and got morphine for generalized pain, on pain reassessment pt complained that he could see bugs crawling on him. After about two hours after the morphine administration, pt stated he does not see any more bugs. However, pt did not have any complaints when he got morphine with earlier or previous day's morphine administration. Will continue to monitor pt------Lakia Gritton, rn

## 2013-01-26 NOTE — Evaluation (Signed)
Occupational Therapy Evaluation Patient Details Name: Phillip Maldonado MRN: [id number] DOB: [birth year] Today's Date: 01/26/2013 Time: 4098-1191 OT Time Calculation (min): 22 min  OT Assessment / Plan / Recommendation History of present illness Pt admit with upper GIB.     Clinical Impression   Pt admitted with above.  He presents to OT with below listed deficits.  He fatigues with BADLs, and will benefit from OT to maximize safety and independence with BADLs.  Recommend SNF at discharge due to pt lives alone and does not have assist at discharge    OT Assessment  Patient needs continued OT Services    Follow Up Recommendations  SNF    Barriers to Discharge Decreased caregiver support    Equipment Recommendations  None recommended by OT    Recommendations for Other Services    Frequency  Min 2X/week    Precautions / Restrictions Precautions Precautions: Fall Restrictions Weight Bearing Restrictions: No   Pertinent Vitals/Pain     ADL  Eating/Feeding: Independent Where Assessed - Eating/Feeding: Chair Grooming: Wash/dry face;Wash/dry hands;Brushing hair;Min guard Where Assessed - Grooming: Unsupported standing Upper Body Bathing: Supervision/safety Where Assessed - Upper Body Bathing: Supported sitting Lower Body Bathing: Minimal assistance Where Assessed - Lower Body Bathing: Unsupported sit to stand Upper Body Dressing: Set up;Supervision/safety Where Assessed - Upper Body Dressing: Unsupported sitting Lower Body Dressing: Minimal assistance Where Assessed - Lower Body Dressing: Unsupported sit to stand Toilet Transfer: Min Pension scheme manager Method: Sit to stand;Stand pivot Acupuncturist: Comfort height toilet;Grab bars Toileting - Architect and Hygiene: Min guard Where Assessed - Engineer, mining and Hygiene: Standing Equipment Used: Rolling walker Transfers/Ambulation Related to ADLs: min guard assist ADL Comments: Pt  able to don/doff socks.  He reports that he had a hip kit, but it disappeared when family went to SNF to retrieve his belongings.  Pt fatigues with BADLs    OT Diagnosis: Generalized weakness  OT Problem List: Decreased strength;Decreased activity tolerance;Decreased knowledge of use of DME or AE;Obesity OT Treatment Interventions: Self-care/ADL training;DME and/or AE instruction;Therapeutic exercise;Therapeutic activities;Patient/family education   OT Goals(Current goals can be found in the care plan section) Acute Rehab OT Goals Patient Stated Goal: To get stronger so he can eventually get home. OT Goal Formulation: With patient Time For Goal Achievement: 02/09/13 Potential to Achieve Goals: Good ADL Goals Pt Will Perform Grooming: with modified independence;standing Pt Will Perform Upper Body Bathing: with modified independence;sitting Pt Will Perform Lower Body Bathing: with modified independence;sit to/from stand Pt Will Perform Upper Body Dressing: with modified independence;sitting Pt Will Perform Lower Body Dressing: with modified independence;sit to/from stand Pt Will Transfer to Toilet: with modified independence;ambulating;regular height toilet;grab bars Pt Will Perform Toileting - Clothing Manipulation and hygiene: with modified independence;sit to/from stand Pt Will Perform Tub/Shower Transfer: Tub transfer;with supervision;ambulating;rolling walker;shower seat  Visit Information  Last OT Received On: 01/26/13 Assistance Needed: +1 History of Present Illness: Pt admit with upper GIB.         Prior Functioning     Home Living Family/patient expects to be discharged to:: Private residence Living Arrangements: Alone Type of Home: Apartment Home Access: Level entry Home Layout: One level Home Equipment: Cane - single point;Walker - 4 wheels (handicapped toilet) Adaptive Equipment: Reacher;Sock aid Additional Comments: Went to [facility] after last admit  and stayed 2 weeks and was due to leave and began having chest pain and DM issues therefore pt transported to [facility]   Prior Function Level of  Independence: Independent with assistive device(s) Comments: uses cane in house and walker when out in community, Independent with ADLs Communication Communication: No difficulties Dominant Hand: Right         Vision/Perception Vision - History Baseline Vision: Wears glasses all the time Patient Visual Report: No change from baseline   Cognition  Cognition Arousal/Alertness: Awake/alert Behavior During Therapy: Flat affect Overall Cognitive Status: Within Functional Limits for tasks assessed    Extremity/Trunk Assessment Upper Extremity Assessment Upper Extremity Assessment: RUE deficits/detail RUE Deficits / Details: decreased shoulder AROM approx 40-45 degrees, old shoulder fx in 2011 per pt Lower Extremity Assessment Lower Extremity Assessment: Defer to PT evaluation Cervical / Trunk Assessment Cervical / Trunk Assessment: Normal     Mobility Bed Mobility Bed Mobility: Not assessed Transfers Transfers: Sit to Stand;Stand to Sit Sit to Stand: 4: Min guard;With upper extremity assist;From chair/3-in-1;From toilet Stand to Sit: 4: Min guard;With upper extremity assist;To chair/3-in-1;To toilet Details for Transfer Assistance: min guard for safety and balance     Exercise     Balance Balance Balance Assessed: Yes Static Standing Balance Static Standing - Balance Support: No upper extremity supported Static Standing - Level of Assistance: 5: Stand by assistance Dynamic Standing Balance Dynamic Standing - Balance Support: No upper extremity supported;During functional activity Dynamic Standing - Level of Assistance: 5: Stand by assistance Dynamic Standing - Balance Activities: Other (comment) (peri care)   End of Session OT - End of Session Equipment Utilized During Treatment: Rolling walker Activity Tolerance: Patient  limited by fatigue Patient left: in chair;with call bell/phone within reach Nurse Communication: Mobility status  GO     Phillip Maldonado M 01/26/2013, 11:50 AM

## 2013-01-26 NOTE — Progress Notes (Signed)
3 Days Post-Op  Subjective: WANTS HIS METHADONE  Objective: Vital signs in last 24 hours: Temp:  [98.1 F (36.7 C)-98.7 F (37.1 C)] 98.5 F (36.9 C) (11/13 0424) Pulse Rate:  [53-98] 98 (11/12 2349) Resp:  [13-28] 19 (11/13 0424) BP: (138-162)/(62-90) 155/66 mmHg (11/13 0424) SpO2:  [95 %-100 %] 97 % (11/13 0424) Last BM Date: 01/25/13  Intake/Output from previous day: 11/12 0701 - 11/13 0700 In: 385 [I.V.:110; IV Piggyback:275] Out: 525 [Urine:525] Intake/Output this shift:    General appearance: alert, fatigued and morbidly obese ABD  No pain  Lab Results:   Recent Labs  01/25/13 0500 01/26/13 0551  WBC 5.0 8.7  HGB 13.3 7.4*  HCT 37.5* 23.1*  PLT 135* 239   BMET  Recent Labs  01/25/13 0500 01/26/13 0551  NA 134* 139  K 3.9 3.7  CL 102 108  CO2 20 20  GLUCOSE 136* 180*  BUN 7 9  CREATININE 0.61 0.77  CALCIUM 7.8* 8.4   PT/INR No results found for this basename: LABPROT, INR,  in the last 72 hours ABG No results found for this basename: PHART, PCO2, PO2, HCO3,  in the last 72 hours  Studies/Results: Nm Gi Blood Loss  01/24/2013   CLINICAL DATA:  GI bleed/melena  EXAM: NUCLEAR MEDICINE GASTROINTESTINAL BLEEDING SCAN  TECHNIQUE: Sequential abdominal images were obtained following intravenous administration of Tc-9m labeled red blood cells.  COMPARISON:  None.  RADIOPHARMACEUTICALS:  25 mCi Tc-20m in-vitro labeled red cells.  FINDINGS: Patient was imaged for 120 min.  No suspicious activity is seen to suggest a GI bleed.  IMPRESSION: No scintigraphic evidence of a GI bleed.   Electronically Signed   By: Charline Bills M.D.   On: 01/24/2013 18:14   Dg Chest Port 1 View  01/24/2013   CLINICAL DATA:  Central line placement.  EXAM: PORTABLE CHEST - 1 VIEW  COMPARISON:  01/23/2013.  FINDINGS: Right internal jugular catheter is in place with the tip at the level of the mid to distal superior vena cava. No gross pneumothorax.  Cardiomegaly.  Pulmonary  vascular congestion/ pulmonary edema.  Postsurgical changes cervical spine upper thoracic spine and left upper quadrant.  IMPRESSION: Right internal jugular catheter is in place with the tip at the level of the mid to distal superior vena cava. No gross pneumothorax.  Cardiomegaly.  Pulmonary vascular congestion/ pulmonary edema.   Electronically Signed   By: Bridgett Larsson M.D.   On: 01/24/2013 12:27   Dg Kayleen Memos W/water Sol Cm  01/24/2013   CLINICAL DATA:  Anemia. GI bleed. History of gastric bypass surgery.  EXAM: WATER SOLUBLE UPPER GI SERIES  TECHNIQUE: Single-column upper GI series was performed using water soluble contrast.  CONTRAST:  75 cc Water-soluble contrast  COMPARISON:  None.  FLUOROSCOPY TIME:  1 min and 51 seconds  FINDINGS: Limited study due to the patient's size and immobility.  Initial swallows demonstrate normal appearance of the esophagus. Extensive surgical changes are noted near the GE junction an but no hiatal hernia is identified. The stomach is narrowing may be partially stapled but no leak or obvious mass or large ulcer. Contrast enters the duodenum without difficulty in the duodenum bulb and C-loop appear normal.  IMPRESSION: Postoperative changes involving the stomach but no findings for leaking contrast, obstruction, mass or large ulcer.   Electronically Signed   By: Loralie Champagne M.D.   On: 01/24/2013 10:17    Anti-infectives: Anti-infectives   None      Assessment/Plan:  UGI bleed with unclear source Hx of gastric bypass/ staple with unclear anatomy Morbid obesity Chronic pain Pt states he is on methadone and has not had any Not sure HGB of 13 real from yesterday.  7.4 today and 5.7 before blood given. Without identifiable source any surgical intervention futile and has significant liklelyhood of failure. He is stable HD Cont to follow and re endoscopy per GI.  LOS: 3 days    Jlyn Bracamonte A. 01/26/2013

## 2013-01-26 NOTE — Clinical Social Work Note (Signed)
CSW checked pt's chart for PT notes to send clinicals to facilities, and continues to monitor chart for PT eval. CSW will send clinicals when pt is medically ready to be evaluated and PT makes their recommendation.   Maryclare Labrador, MSW, [facility] Clinical Social Worker [phone]

## 2013-01-27 ENCOUNTER — Inpatient Hospital Stay (HOSPITAL_COMMUNITY): Payer: Medicare Other | Admitting: Anesthesiology

## 2013-01-27 ENCOUNTER — Encounter (HOSPITAL_COMMUNITY)
Admission: EM | Disposition: A | Discharge status: Home Health | Payer: Self-pay | Source: Home / Self Care | Attending: Internal Medicine

## 2013-01-27 ENCOUNTER — Encounter (HOSPITAL_COMMUNITY): Payer: Self-pay | Admitting: Certified Registered Nurse Anesthetist

## 2013-01-27 ENCOUNTER — Encounter (HOSPITAL_COMMUNITY): Payer: Self-pay

## 2013-01-27 HISTORY — PX: ESOPHAGOGASTRODUODENOSCOPY: SHX5428

## 2013-01-27 LAB — GLUCOSE, CAPILLARY
Glucose-Capillary: 122 mg/dL — ABNORMAL HIGH (ref 70–99)
Glucose-Capillary: 165 mg/dL — ABNORMAL HIGH (ref 70–99)
Glucose-Capillary: 167 mg/dL — ABNORMAL HIGH (ref 70–99)

## 2013-01-27 SURGERY — EGD (ESOPHAGOGASTRODUODENOSCOPY)
Anesthesia: General | Laterality: Left

## 2013-01-27 MED ORDER — OXYCODONE HCL 5 MG/5ML PO SOLN: 5.0000 mg | Freq: Once | ORAL | Status: DC | PRN

## 2013-01-27 MED ORDER — FUROSEMIDE 40 MG PO TABS
40.0000 mg | ORAL_TABLET | Freq: Every day | ORAL | Status: DC
  Administered 2013-01-27 – 2013-01-30 (×4): 40 mg via ORAL
  Filled 2013-01-27 (×4): qty 1

## 2013-01-27 MED ORDER — MEPERIDINE HCL 25 MG/ML IJ SOLN: 6.2500 mg | INTRAMUSCULAR | Status: DC | PRN

## 2013-01-27 MED ORDER — MIDAZOLAM HCL 2 MG/2ML IJ SOLN: 0.5000 mg | Freq: Once | INTRAMUSCULAR | Status: DC | PRN

## 2013-01-27 MED ORDER — ZOLPIDEM TARTRATE 5 MG PO TABS
5.0000 mg | ORAL_TABLET | Freq: Every evening | ORAL | Status: DC | PRN
  Administered 2013-01-27: 5 mg via ORAL
  Administered 2013-01-29 (×2): 10 mg via ORAL
  Filled 2013-01-27: qty 1
  Filled 2013-01-27 (×2): qty 2

## 2013-01-27 MED ORDER — SODIUM CHLORIDE 0.9 % IV SOLN
8.0000 mg/h | INTRAVENOUS | Status: DC
  Filled 2013-01-27: qty 80

## 2013-01-27 MED ORDER — SODIUM CHLORIDE 0.9 % IV SOLN
8.0000 mg/h | INTRAVENOUS | Status: DC
  Administered 2013-01-27 – 2013-01-28 (×2): 8 mg/h via INTRAVENOUS
  Filled 2013-01-27 (×10): qty 80

## 2013-01-27 MED ORDER — PROMETHAZINE HCL 25 MG/ML IJ SOLN: 6.2500 mg | INTRAMUSCULAR | Status: DC | PRN

## 2013-01-27 MED ORDER — PANTOPRAZOLE SODIUM 40 MG IV SOLR: 40.0000 mg | Freq: Two times a day (BID) | INTRAVENOUS | Status: DC

## 2013-01-27 MED ORDER — PROPOFOL 10 MG/ML IV BOLUS
INTRAVENOUS | Status: DC | PRN
  Administered 2013-01-27: 50 mg via INTRAVENOUS
  Administered 2013-01-27: 200 mg via INTRAVENOUS

## 2013-01-27 MED ORDER — ONDANSETRON HCL 4 MG/2ML IJ SOLN
INTRAMUSCULAR | Status: DC | PRN
  Administered 2013-01-27: 4 mg via INTRAVENOUS

## 2013-01-27 MED ORDER — HYDROMORPHONE HCL PF 1 MG/ML IJ SOLN: 0.2500 mg | INTRAMUSCULAR | Status: DC | PRN

## 2013-01-27 MED ORDER — OXYCODONE HCL 5 MG PO TABS: 5.0000 mg | ORAL_TABLET | Freq: Once | ORAL | Status: DC | PRN

## 2013-01-27 MED ORDER — PHENYLEPHRINE HCL 10 MG/ML IJ SOLN
INTRAMUSCULAR | Status: DC | PRN
  Administered 2013-01-27: 80 ug via INTRAVENOUS

## 2013-01-27 MED ORDER — POTASSIUM CHLORIDE CRYS ER 10 MEQ PO TBCR
10.0000 meq | EXTENDED_RELEASE_TABLET | Freq: Every day | ORAL | Status: DC
  Administered 2013-01-27 – 2013-01-30 (×4): 10 meq via ORAL
  Filled 2013-01-27 (×4): qty 1

## 2013-01-27 MED ORDER — SODIUM CHLORIDE 0.9 % IV SOLN
80.0000 mg | Freq: Once | INTRAVENOUS | Status: AC
  Administered 2013-01-27: 16:00:00 80 mg via INTRAVENOUS
  Filled 2013-01-27: qty 80

## 2013-01-27 MED ORDER — INSULIN ASPART 100 UNIT/ML ~~LOC~~ SOLN
0.0000 [IU] | Freq: Three times a day (TID) | SUBCUTANEOUS | Status: DC
  Administered 2013-01-27 – 2013-01-28 (×2): 3 [IU] via SUBCUTANEOUS
  Administered 2013-01-28 – 2013-01-29 (×2): 2 [IU] via SUBCUTANEOUS
  Administered 2013-01-29: 3 [IU] via SUBCUTANEOUS
  Administered 2013-01-29: 2 [IU] via SUBCUTANEOUS
  Administered 2013-01-30 (×2): 3 [IU] via SUBCUTANEOUS

## 2013-01-27 NOTE — Op Note (Signed)
Phillip Maldonado [facility] [address] [place], [address]   ENDOSCOPY PROCEDURE REPORT  PATIENT: [name], [name]  MR#: [id number] BIRTHDATE: 12-Feb-1951 , 61  yrs. old GENDER: Male  ENDOSCOPIST: Charlott Rakes, MD REFERRED [id number] team  PROCEDURE DATE:  01/27/2013 PROCEDURE:   EGD, diagnostic ASA CLASS:   Class III INDICATIONS:Melena.   Acute post hemorrhagic anemia. MEDICATIONS: See Anesthesia Report.  TOPICAL ANESTHETIC:  DESCRIPTION OF PROCEDURE:   After the risks benefits and alternatives of the procedure were thoroughly explained, informed consent was obtained.  The Pentax Gastroscope [id number]  endoscope was introduced through the mouth and advanced to the second portion of the duodenum , limited by Without limitations.   The instrument was slowly withdrawn as the mucosa was fully examined.     FINDINGS: The endoscope was inserted into the oropharynx and esophagus was intubated.  The gastroesophageal junction was noted to be 45 cm from the incisors. Upon entering the stomach a curled up metallic wire was noted coming from the proximal stomach. Upon further exploration of this area there was suture material and staples noted and a clean-based marginal ulcer was seen. The endoscope was able to advance distal to this area and an afferent and efferent limb of small bowel was seen without any blood products noted. Endoscope was advanced further into the stomach and it revealed a portion of gastric mucosa in the body of the stomach with a false opening in the mucosa. This area was better visualized on retroflexion.  The endoscope was advanced to the duodenal bulb and second portion of duodenum which were unremarkable.  The endoscope was withdrawn back into the stomach and further exploration of the area again showed the unusual anatomy of the proximal stomach and a small hiatal hernia was also seen. No active bleeding was seen. I had Dr. Derrell Lolling look  at the endoscopic findings as well to get his opinion on the anatomy.  COMPLICATIONS: None  ENDOSCOPIC IMPRESSION:     1. Marginal ulcer at what appears to be a gastric bypass in the proximal stomach (no active bleeding or visible vessel) 2. See above for details about unusual anatomy noted 3. Hiatal hernia  RECOMMENDATIONS: Will discuss with Dr. Daphine Deutscher regarding this anatomy but due to recurrent bleeding will need a surgical revision in the near future; Start Protonix drip; Clears ok   REPEAT EXAM: N/A  _______________________________ Charlott Rakes, MD eSigned:  Charlott Rakes, MD 01/27/2013 11:13 AM    CC:  PATIENT NAME:  Phillip Maldonado, Phillip Maldonado MR#: [id number]

## 2013-01-27 NOTE — Interval H&P Note (Signed)
History and Physical Interval Note:  01/27/2013 9:29 AM  Phillip Maldonado  has presented today for surgery, with the diagnosis of Hematemesis, melena, anemia  The various methods of treatment have been discussed with the patient and family. After consideration of risks, benefits and other options for treatment, the patient has consented to  Procedure(s): ESOPHAGOGASTRODUODENOSCOPY (EGD) (Left) as a surgical intervention .  The patient's history has been reviewed, patient examined, no change in status, stable for surgery.  I have reviewed the patient's chart and labs.  Questions were answered to the patient's satisfaction.     Juluis Fitzsimmons C.

## 2013-01-27 NOTE — Transfer of Care (Signed)
Immediate Anesthesia Transfer of Care Note  Patient: Phillip Maldonado  Procedure(s) Performed: Procedure(s): ESOPHAGOGASTRODUODENOSCOPY (EGD) (N/A)  Patient Location: PACU  Anesthesia Type:General  Level of Consciousness: awake, alert , oriented and patient cooperative  Airway & Oxygen Therapy: Patient Spontanous Breathing and Patient connected to face mask oxygen  Post-op Assessment: Report given to PACU RN, Post -op Vital signs reviewed and stable and Patient moving all extremities  Post vital signs: Reviewed and stable  Complications: No apparent anesthesia complications

## 2013-01-27 NOTE — Clinical Social Work Placement (Signed)
Clinical Social Work Department CLINICAL SOCIAL WORK PLACEMENT NOTE 01/27/2013  Patient:  Phillip Maldonado, Phillip Maldonado  Account Number:  [id number] Admit date:  01/23/2013  Clinical Social Worker:  Maryclare Labrador, Theresia Majors  Date/time:  01/27/2013 04:10 PM  Clinical Social Work is seeking post-discharge placement for this patient at the following level of care:   SKILLED NURSING   (*CSW will update this form in Epic as items are completed)   01/27/2013  Patient/family provided with [facility] Department of Clinical Social Work's list of facilities offering this level of care within the geographic area requested by the patient (or if unable, by the patient's family).  01/27/2013  Patient/family informed of their freedom to choose among providers that offer the needed level of care, that participate in Medicare, Medicaid or managed care program needed by the patient, have an available bed and are willing to accept the patient.  01/27/2013  Patient/family informed of [facility]' ownership interest in [facility], as well as of the fact that they are under no obligation to receive care at this facility.  PASARR submitted to EDS on 01/27/2013 PASARR number received from EDS on 01/27/2013  FL2 transmitted to all facilities in geographic area requested by pt/family on  01/27/2013 FL2 transmitted to all facilities within larger geographic area on 01/27/2013  Patient informed that his/her managed care company has contracts with or will negotiate with  certain facilities, including the following:     Patient/family informed of bed offers received:  01/27/2013 Patient chooses bed at  Physician recommends and patient chooses bed at    Patient to be transferred to  on   Patient to be transferred to facility by   The following physician request were entered in Epic:   Additional Comments:   Maryclare Labrador, MSW, [facility] Clinical Social Worker [phone]

## 2013-01-27 NOTE — Clinical Social Work Note (Signed)
CSW submitted pt's clinicals to facilities requested by pt & his sister. CSW will present bed offers when available and will expand search if necessary.   Maryclare Labrador, MSW, [facility] Clinical Social Worker [phone]

## 2013-01-27 NOTE — H&P (View-Only) (Signed)
Subjective: No bleeding. Tolerating clears.  Objective: Vital signs in last 24 hours: Temp:  [98.1 F (36.7 C)-98.7 F (37.1 C)] 98.5 F (36.9 C) (11/13 0424) Pulse Rate:  [53-98] 98 (11/12 2349) Resp:  [13-28] 19 (11/13 0424) BP: (138-162)/(62-90) 155/66 mmHg (11/13 0424) SpO2:  [95 %-100 %] 97 % (11/13 0424) Weight change:  Last BM Date: 01/25/13  PE: GEN:  Overweight, chronically ill-appearing, NAD NEURO:  A bit agitated and tremulous ABD:  Soft, protuberant  Lab Results: CBC    Component Value Date/Time   WBC 8.7 01/26/2013 0551   RBC 2.59* 01/26/2013 0551   HGB 7.4* 01/26/2013 0551   HCT 23.1* 01/26/2013 0551   PLT 239 01/26/2013 0551   MCV 89.2 01/26/2013 0551   MCH 28.6 01/26/2013 0551   MCHC 32.0 01/26/2013 0551   RDW 16.1* 01/26/2013 0551   LYMPHSABS 3.3 01/23/2013 0205   MONOABS 0.5 01/23/2013 0205   EOSABS 0.1 01/23/2013 0205   BASOSABS 0.0 01/23/2013 0205   Assessment:  1.  Recurrent obscure overt GI bleeding, originating from upper GI tract, seemingly somewhere in the proximal stomach, but exact site unclear.  Bleeding has again stopped.  Plan:  1.  Can advance to clear liquids. 2.  Continue PPI. 3.  Repeat endoscopy tomorrow.  If this again doesn't yield source of bleeding, one would have to assume the most likely bleeding culprit is a Dieulafoy lesion.  And, in this case, there is little else for [place] to do, except manage expectantly and pursue repeat endoscopy when/if he rebleeds, understanding that expedited endoscopy may also be likewise unsuccessful in yielding bleeding site (which has been the case on a couple of occasions already).   Phillip Maldonado 01/26/2013, 8:05 AM

## 2013-01-27 NOTE — Clinical Social Work Note (Signed)
CSW called pt's sister, and informed her that the facilities they wanted are unable to offer bed. Sister expressed concern that pt will refuse SNF and opt for home health when CSW offered to send clinicals to all SNFs in [place]--sister authorized CSW to do this but asked that CSW inform RNCM that pt will likely want home health. RNCM informed CSW that home health is in place and that pt today told her he is refusing SNF. CSW signing off, as no CSW needs now that pt is refusing SNF.   Maryclare Labrador, MSW, [facility] Clinical Social Worker [phone]

## 2013-01-27 NOTE — Brief Op Note (Signed)
See endopro for details. Marginal ulcer noted at area of gastric bypass that is not actively bleeding at this time but at increased risk for bleeding. Adjacent surgical staples and suture material complicating healing of this area and will discuss further with Dr. Daphine Deutscher from bariatric surgery but I think he will need surgical revision in the near future. Patient aware of findings and plans. Clear liquid diet only today.

## 2013-01-27 NOTE — Progress Notes (Signed)
Report given to rebecca s for lunch relief

## 2013-01-27 NOTE — Anesthesia Procedure Notes (Signed)
Procedure Name: Intubation Date/Time: 01/27/2013 9:58 AM Performed by: Jerilee Hoh Pre-anesthesia Checklist: Patient identified, Emergency Drugs available, Suction available and Patient being monitored Patient Re-evaluated:Patient Re-evaluated prior to inductionOxygen Delivery Method: Circle system utilized Preoxygenation: Pre-oxygenation with 100% oxygen Intubation Type: IV induction Laryngoscope Size: Mac and 4 Grade View: Grade I Tube type: Oral Tube size: 7.5 mm Number of attempts: 1 Airway Equipment and Method: Stylet Placement Confirmation: ETT inserted through vocal cords under direct vision,  positive ETCO2 and breath sounds checked- equal and bilateral Secured at: 24 cm Tube secured with: Tape Dental Injury: Teeth and Oropharynx as per pre-operative assessment

## 2013-01-27 NOTE — Progress Notes (Deleted)
   CARE MANAGEMENT NOTE 01/27/2013  Patient:  Phillip Maldonado   Account Number:  [id number]  Date Initiated:  01/27/2013  Documentation initiated by:  Joee Iovine  Subjective/Objective Assessment:   adm with dx of systolic failure; lives alone, has walker, aide 2-3 hrs/day, 7 days/wk    PCP  Dr Oliver Barre      DC Planning Services  CM consult      Per UR Regulation:  Reviewed for med. necessity/level of care/duration of stay  Comments:  01/27/13 0949 [facility] RN MSN BSN CCM Per pt, PCS aide helps with cleaning and food preparation and he is also on the waiting list for meals on wheels. States he has one dtr in the area that helps as she can.

## 2013-01-27 NOTE — Progress Notes (Signed)
Patient ID: Phillip Maldonado, male   DOB: [birth year], 62 y.o.   MRN: [id number]  Addendum:  Discussed endoscopic findings and UGIS with Dr. Wenda Low. If he remains stable without further bleeding, then he will need to see Dr. Daphine Deutscher in his office to decide on laparoscopic revision of his gastric bypass. I would recommend he stay in the hospital for next 2-3 days due to high risk of bleeding and need for Protonix drip. Will see again tomorrow. Will consider advancing diet tomorrow if doing ok.

## 2013-01-27 NOTE — Progress Notes (Signed)
PT Cancellation Note  Patient Details Name: Phillip Maldonado MRN: [id number] DOB: Apr 17, 1950   Cancelled Treatment:    Reason Eval/Treat Not Completed: Patient at procedure or test/unavailable.  Pt in endoscopy.  Will return at later date.  Thanks.   INGOLD,Yusra Ravert 01/27/2013, 11:27 AM  [facility] [phone] (pager)

## 2013-01-27 NOTE — Anesthesia Preprocedure Evaluation (Signed)
Anesthesia Evaluation  Patient identified by MRN, date of birth, ID band Patient awake    History of Anesthesia Complications (+) history of anesthetic complications  Airway Mallampati: II TM Distance: >3 FB Neck ROM: Full    Dental  (+) Poor Dentition, Loose, Caps, Chipped and Dental Advisory Given   Pulmonary sleep apnea and Continuous Positive Airway Pressure Ventilation ,  breath sounds clear to auscultation  Pulmonary exam normal       Cardiovascular hypertension, Pt. on medications and Pt. on home beta blockers - angina+CHF + dysrhythmias (recent Afib, now converted to NSR on Diltiazem) Atrial Fibrillation Rhythm:Regular Rate:Normal  11/14 ECHO: EF 55-60%, very mild AS   Neuro/Psych  Headaches, Anxiety Depression Chronic back pain: narcotics, methadone    GI/Hepatic GERD-  Controlled,(+) Hepatitis -, BGI bleed S/p gastric bypass   Endo/Other  diabetes (glu 147), Type 2, Oral Hypoglycemic AgentsMorbid obesity  Renal/GU negative Renal ROS     Musculoskeletal   Abdominal (+) + obese,   Peds  Hematology  (+) Blood dyscrasia (Hb 7.4), anemia ,   Anesthesia Other Findings   Reproductive/Obstetrics                           Anesthesia Physical Anesthesia Plan  ASA: III  Anesthesia Plan: General   Post-op Pain Management:    Induction: Intravenous  Airway Management Planned: Oral ETT  Additional Equipment:   Intra-op Plan:   Post-operative Plan: Extubation in OR  Informed Consent: I have reviewed the patients History and Physical, chart, labs and discussed the procedure including the risks, benefits and alternatives for the proposed anesthesia with the patient or authorized representative who has indicated his/her understanding and acceptance.   Dental advisory given  Plan Discussed with: Surgeon and CRNA  Anesthesia Plan Comments: (Plan routine monitors, GETA)         Anesthesia Quick Evaluation

## 2013-01-27 NOTE — Progress Notes (Signed)
Patient ID: Phillip Maldonado, male   DOB: [birth year], 62 y.o.   MRN: [id number] 4 Days Post-Op  Subjective: Pt feels better today.  No abdominal pain.  Had a BM that smelled normal (he didn't look at it to see if it had blood in it)  Objective: Vital signs in last 24 hours: Temp:  [98.1 F (36.7 C)-98.6 F (37 C)] 98.2 F (36.8 C) (11/14 0658) Pulse Rate:  [74-93] 74 (11/14 0658) Resp:  [15-22] 22 (11/14 0658) BP: (102-149)/(43-83) 102/43 mmHg (11/14 0658) SpO2:  [95 %-97 %] 95 % (11/14 0658) Last BM Date: 01/26/13  Intake/Output from previous day: 11/13 0701 - 11/14 0700 In: 2182.5 [P.O.:1050; I.V.:1132.5] Out: 200 [Urine:200] Intake/Output this shift:    PE: Abd: soft, NT, ND, +BS, obese  Lab Results:   Recent Labs  01/25/13 0500 01/26/13 0551  WBC 5.0 8.7  HGB 13.3 7.4*  HCT 37.5* 23.1*  PLT 135* 239   BMET  Recent Labs  01/25/13 0500 01/26/13 0551  NA 134* 139  K 3.9 3.7  CL 102 108  CO2 20 20  GLUCOSE 136* 180*  BUN 7 9  CREATININE 0.61 0.77  CALCIUM 7.8* 8.4   PT/INR No results found for this basename: LABPROT, INR,  in the last 72 hours CMP     Component Value Date/Time   NA 139 01/26/2013 0551   K 3.7 01/26/2013 0551   CL 108 01/26/2013 0551   CO2 20 01/26/2013 0551   GLUCOSE 180* 01/26/2013 0551   BUN 9 01/26/2013 0551   CREATININE 0.77 01/26/2013 0551   CALCIUM 8.4 01/26/2013 0551   PROT 5.5* 01/23/2013 1025   ALBUMIN 2.5* 01/23/2013 1025   AST 25 01/23/2013 1025   ALT 22 01/23/2013 1025   ALKPHOS 49 01/23/2013 1025   BILITOT 0.3 01/23/2013 1025   GFRNONAA >90 01/26/2013 0551   GFRAA >90 01/26/2013 0551   Lipase     Component Value Date/Time   LIPASE 25 01/23/2013 0205       Studies/Results: No results found.  Anti-infectives: Anti-infectives   None       Assessment/Plan  1. UGI bleed, location unable to be determined 2. New onset A fib Patient Active Problem List   Diagnosis Date Noted  . Atrial fibrillation  with RVR 01/24/2013  . Acute blood loss anemia 01/02/2013  . History of gastric bypass 01/02/2013  . UGIB (upper gastrointestinal bleed) 01/01/2013  . HTN (hypertension) 09/18/2011  . Diabetes mellitus 09/18/2011  . Hypercholesterolemia 09/18/2011  . OSA (obstructive sleep apnea) 09/18/2011  . Chest pain 05/09/2011  . Hypokalemia 05/09/2011  . Constipation 05/09/2011   Plan: 1. Patient to return to endo suite today for repeat endo to try an localize a source.  Will follow along.  No plans for surgery unless able to determine localization.  Hopefully, patient has stabilized and has stopped bleeding.   LOS: 4 days    Caitlyne Ingham E 01/27/2013, 8:08 AM Pager: [phone]

## 2013-01-27 NOTE — Progress Notes (Signed)
Pt positioned supine on stretcher; ok with MD for procedure to be done in supine position. Patient's arms padded with foam and pt secured in position on stretcher with belt. All other pressure points padded and check for pressure. No pressure noted. Phillip Maldonado, Rosanna Randy

## 2013-01-27 NOTE — Progress Notes (Signed)
[facility] Progress Note [place] TEAM 1 - Stepdown ICU Team   Phillip Maldonado UJW:[id number] DOB: 1951/03/15 DOA: 01/23/2013 PCP: No PCP Per Patient  Brief narrative: 62 y.o. male who was admitted 3 weeks prior to this admit for acute GI bleed. EGD at that time was unrevealing - questioned a  dieulfoy lesion.  After discharge, he did well until 24 hours prior to [facility] when he started experiencing abdominal pain followed by a large black-colored bowel movement. Patient became very dizzy and weak. When he tried to walk the patient had a syncopal episode. Patient was brought to the ER where he was found to be hypotensive. Hemoglobin was found to be around 6.5 (3 gm lower than prior dc reading).  Assessment/Plan:  UGIB (upper gastrointestinal bleed) -EGD 11/10: large maroon clot seen without actual source of bleeding clarified -Surgery consulted - on stand by in case he re-bleeds -NM Bleeding scan 11/11 unrevealing -Repeat endoscopy 11/14:  "Marginal ulcer noted at area of gastric bypass that is not actively bleeding at this time but at increased risk for bleeding. Adjacent surgical staples and suture material complicating healing of this area and will discuss further with Dr. Daphine Deutscher from bariatric surgery but I think he will need surgical revision in the near future." -cont PPI drip per GI -GI allowing clears -If he does not rebleed will transfer to tele on 11/15  Acute blood loss anemia -due to ulceration near prior gastric bypass sutures -has received 4 units of PRBCs -poor IV access so emergent CL placed per PCCM - leave in place 11/14 as he is at high risk to re-bleed.  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  Shoulder pain -on methadone pre admit with Neurontin   Depression/deconditioning/Chronic pain -Father close to passing - patient concerned about his 2 sons (of whom he just won custody) -resume Methadone -on Effexor, Trazadone, and Pristiq -d/w sister and POA - agrees pt not appropriate to return home alone -PT/OT recommended home health -SW to aid in process for Medicaid vs disability and possible ALF placement vs rehab -Psych SW eval for ongoing depression   DVT prophylaxis: SCDs Code Status: Full Family Communication: Patient Disposition Plan/Expected LOS: Stepdown   Consultants: Gastroenterology Surgery  Procedures: EGD 11/10 EGD 11/14  2D ECHO  Antibiotics: None  HPI/Subjective: Complains of being very sad over his father (who will likely die in the next day or two).  Feels like he needs to see his Dad.  Also sad that his sons are supposed to be with him this weekend and he will likely still be in the hospital.  Denies abdom pain, cp, sob, or f/c.  Objective: Blood pressure 159/60, pulse 76, temperature 97.6 F (36.4 C), temperature source Oral, resp. rate 18, height 5\' 11"  (1.803 m), weight 205 kg (451 lb 15.1 oz), SpO2 99.00%.  Intake/Output Summary (Last 24 hours) at 01/27/13 1350 Last data filed at 01/27/13 1041  Gross per 24 hour  Intake  1890 ml  Output      0 ml  Net   1890 ml   Exam: General: Pleasant obese male, sitting in chair, appears to be in no resp distress - central access in right neck. Lungs: cta - no w/c/r, no accessory muscle use. Cardiovascular: rrr no m/r/g, 1+ lower ext edema Abdomen: Nontender, nondistended, soft, bowel sounds  positive, no rebound, no ascites, no appreciable mass Musculoskeletal: No significant cyanosis, clubbing of bilateral lower extremities Neurological: Alert and oriented x 3, moves all extremities x 4 without focal neurological deficits, CN 2-12 intact  Scheduled Meds:  Scheduled Meds: . amLODipine  10 mg Oral Daily  . furosemide  40 mg Oral Daily  . insulin aspart  0-15 Units Subcutaneous TID WC  . insulin aspart  0-5 Units Subcutaneous QHS  . methadone  10 mg Oral TID  . metoprolol  50 mg Oral BID  . pantoprazole (PROTONIX) IV  80 mg Intravenous Once  . [START ON 01/31/2013] pantoprazole (PROTONIX) IV  40 mg Intravenous Q12H  . sodium chloride  10 mL Intravenous Q12H  . traZODone  300 mg Oral QHS  . venlafaxine XR  150 mg Oral Daily   Data Reviewed: Basic Metabolic Panel:  Recent Labs Lab 01/23/13 0205 01/23/13 1025 01/24/13 1019 01/25/13 0500 01/26/13 0551  NA 138 142 142 134* 139  K 4.1 4.1 4.0 3.9 3.7  CL 98 105 111 102 108  CO2 25 30 23 20 20   GLUCOSE 268* 200* 151* 136* 180*  BUN 35* 35* 15 7 9   CREATININE 0.92 0.93 0.79 0.61 0.77  CALCIUM 8.3* 7.8* 8.1* 7.8* 8.4   Liver Function Tests:  Recent Labs Lab 01/23/13 0205 01/23/13 1025  AST 27 25  ALT 24 22  ALKPHOS 55 49  BILITOT 0.3 0.3  PROT 5.6* 5.5*  ALBUMIN 2.6* 2.5*   CBC:  Recent Labs Lab 01/23/13 0205  01/23/13 1700 01/24/13 0711 01/24/13 0847 01/25/13 0500 01/26/13 0551  WBC 13.4*  < > 11.6* 7.7 8.0 5.0 8.7  NEUTROABS 9.5*  --   --   --   --   --   --   HGB 6.5*  < > 7.6* 6.1* 5.7* 13.3 7.4*  HCT 20.5*  < > 23.8* 19.3* 18.1* 37.5* 23.1*  MCV 91.1  < > 89.5 90.2 89.2 89.3 89.2  PLT 353  < > 263 228 248 135* 239  < > = values in this interval not displayed. CBG:  Recent Labs Lab 01/26/13 2024 01/27/13 0014 01/27/13 0617 01/27/13 1056 01/27/13 1157  GLUCAP 159* 122* 147* 167* 165*    Recent Results (from the past 240 hour(s))  MRSA PCR SCREENING     Status: None   Collection  Time    01/23/13  5:51 AM      Result Value Range Status   MRSA by PCR NEGATIVE  NEGATIVE Final   Comment:            The GeneXpert MRSA Assay (FDA     approved for NASAL specimens     only), is one component of a     comprehensive MRSA colonization     surveillance program. It is not     intended to diagnose MRSA     infection nor to guide or     monitor treatment for     MRSA infections.     Algis Downs, PA-C [facility] Pager: [phone]  I have personally examined this patient and reviewed the  entire database. I have reviewed the above note, made any necessary editorial changes, and agree with its content.  Lonia Blood, MD [facility] Office  [phone] Pager [phone]  On-Call/Text Page:      [email or web address]      password [facility]

## 2013-01-27 NOTE — Progress Notes (Signed)
FOLLOW FOR NOW.

## 2013-01-28 LAB — CBC
HCT: 25.4 % — ABNORMAL LOW (ref 39.0–52.0)
Hemoglobin: 8.1 g/dL — ABNORMAL LOW (ref 13.0–17.0)
MCH: 28.6 pg (ref 26.0–34.0)
MCHC: 31.9 g/dL (ref 30.0–36.0)
MCV: 89.8 fL (ref 78.0–100.0)
RBC: 2.83 MIL/uL — ABNORMAL LOW (ref 4.22–5.81)
RDW: 15.7 % — ABNORMAL HIGH (ref 11.5–15.5)

## 2013-01-28 LAB — GLUCOSE, CAPILLARY
Glucose-Capillary: 133 mg/dL — ABNORMAL HIGH (ref 70–99)
Glucose-Capillary: 105 mg/dL — ABNORMAL HIGH (ref 70–99)

## 2013-01-28 LAB — TYPE AND SCREEN
ABO/RH(D): O POS
Antibody Screen: NEGATIVE
Unit division: 0

## 2013-01-28 LAB — BASIC METABOLIC PANEL
BUN: 8 mg/dL (ref 6–23)
Calcium: 8.3 mg/dL — ABNORMAL LOW (ref 8.4–10.5)
Creatinine, Ser: 0.81 mg/dL (ref 0.50–1.35)
GFR calc non Af Amer: >90 mL/min (ref >90)
Glucose, Bld: 130 mg/dL — ABNORMAL HIGH (ref 70–99)
Potassium: 3.7 mEq/L (ref 3.5–5.1)

## 2013-01-28 NOTE — Progress Notes (Signed)
[facility] Progress Note [name] - Stepdown ICU Team   Phillip Maldonado VHQ:[id number] DOB: 1950/09/10 DOA: 01/23/2013 PCP: No PCP Per Patient  Brief narrative: 62 y.o. male who was admitted 3 weeks prior to this admit for acute GI bleed. EGD at that time was unrevealing - questioned a dieulfoy lesion.  After discharge, he did well until 24 hours prior to [facility] when he started experiencing abdominal pain followed by a large black-colored bowel movement. Patient became very dizzy and weak. When he tried to walk the patient had a syncopal episode. Patient was brought to the ER where he was found to be hypotensive. Hemoglobin was found to be around 6.5 (3 gm lower than prior dc reading).  Assessment/Plan:  UGIB (upper gastrointestinal bleed) -EGD 11/10: large maroon clot seen without actual source of bleeding clarified -Surgery consulted - on stand by in case he re-bleeds -NM Bleeding scan 11/11 unrevealing -Repeat endoscopy 11/14:  "Marginal ulcer noted at area of gastric bypass that is not actively bleeding at this time but at increased risk for bleeding. Adjacent surgical staples and suture material complicating healing of this area and will discuss further with Dr. Daphine Deutscher from bariatric surgery but I think he will need surgical revision in the near future." -cont PPI drip per GI -GI advancing to full liquid diet  Acute blood loss anemia -due to ulceration near prior gastric bypass sutures -has received 5 units of PRBCs this admit  -poor IV access so emergent CL placed per PCCM  Atrial Fibrillation/RVR -exacerbated by ABL anemia and deconditioning -rate controlled at this time  -continue metoprolol   HTN -moderate control with metoprolol and norvasc  History "CHF" -restarted lasix at daily rather than bid 11/14 -ECHO shows preserved LV function with biatrial dilatation  Diabetes mellitus -Lantus started last admit - resume when on solid diet -controlled  here with SSI - increased to moderate 11/14   OSA (obstructive sleep apnea) -HS CPAP  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  Shoulder pain -on methadone pre admit with Neurontin   Depression/deconditioning/Chronic pain -Father close to passing - patient concerned about his 2 sons (of whom he just won custody) -resume Methadone -on Effexor, Trazadone, and Pristiq -d/w sister and POA - agrees pt not appropriate to return home alone -PT/OT recommended home health -SW to aid in process for Medicaid vs disability and possible ALF placement vs rehab -Psych SW eval for ongoing depression   DVT prophylaxis: SCDs Code Status: Full Family Communication: Patient Disposition Plan/Expected LOS: Stepdown due to risk for acute complex GI bleeding   Consultants: Gastroenterology Surgery  Procedures: EGD 11/10 EGD 11/14  2D ECHO  Antibiotics: None  HPI/Subjective: No new complaints today.  Has not noted any blood in his stool.  Denies indigestion.  Tolerating liquid diet thus far.  Objective: Blood pressure 140/66, pulse 82, temperature 98.2 F (36.8 C), temperature source Oral, resp. rate 13, height 5\' 11"  (1.803 m), weight 205 kg (451 lb 15.1 oz), SpO2 98.00%.  Intake/Output Summary (Last 24 hours) at 01/28/13 1442 Last data filed at 01/27/13 1824  Gross per 24 hour  Intake    425 ml  Output      0 ml  Net    425 ml   Exam: General: Pleasant obese male, sitting in chair, appears to be in no resp distress Lungs: Clear to auscultation bilaterally without wheezes or crackles Cardiovascular: Regular rate without murmur gallop or rub Abdomen:  Nontender, nondistended, soft, bowel sounds positive, no rebound, no ascites, no appreciable mass - obese Musculoskeletal: No significant cyanosis, clubbing of bilateral lower extremities Neurological: Alert and oriented x 3, moves all extremities x 4 without focal neurological deficits, CN 2-12  intact  Scheduled Meds:  Scheduled Meds: . amLODipine  10 mg Oral Daily  . furosemide  40 mg Oral Daily  . insulin aspart  0-15 Units Subcutaneous TID WC  . insulin aspart  0-5 Units Subcutaneous QHS  . methadone  10 mg Oral TID  . metoprolol  50 mg Oral BID  . [START ON 01/31/2013] pantoprazole (PROTONIX) IV  40 mg Intravenous Q12H  . potassium chloride  10 mEq Oral Daily  . sodium chloride  10 mL Intravenous Q12H  . venlafaxine XR  150 mg Oral Daily   Data Reviewed: Basic Metabolic Panel:  Recent Labs Lab 01/23/13 1025 01/24/13 1019 01/25/13 0500 01/26/13 0551 01/28/13 0500  NA 142 142 134* 139 139  K 4.1 4.0 3.9 3.7 3.7  CL 105 111 102 108 107  CO2 30 23 20 20 25   GLUCOSE 200* 151* 136* 180* 130*  BUN 35* 15 7 9 8   CREATININE 0.93 0.79 0.61 0.77 0.81  CALCIUM 7.8* 8.1* 7.8* 8.4 8.3*   Liver Function Tests:  Recent Labs Lab 01/23/13 0205 01/23/13 1025  AST 27 25  ALT 24 22  ALKPHOS 55 49  BILITOT 0.3 0.3  PROT 5.6* 5.5*  ALBUMIN 2.6* 2.5*   CBC:  Recent Labs Lab 01/23/13 0205  01/24/13 0711 01/24/13 0847 01/25/13 0500 01/26/13 0551 01/28/13 0500  WBC 13.4*  < > 7.7 8.0 5.0 8.7 7.5  NEUTROABS 9.5*  --   --   --   --   --   --   HGB 6.5*  < > 6.1* 5.7* 13.3 7.4* 8.1*  HCT 20.5*  < > 19.3* 18.1* 37.5* 23.1* 25.4*  MCV 91.1  < > 90.2 89.2 89.3 89.2 89.8  PLT 353  < > 228 248 135* 239 226  < > = values in this interval not displayed. CBG:  Recent Labs Lab 01/27/13 1157 01/27/13 1712 01/27/13 2151 01/28/13 0823 01/28/13 1055  GLUCAP 165* 153* 129* 133* 161*    Recent Results (from the past 240 hour(s))  MRSA PCR SCREENING     Status: None   Collection Time    01/23/13  5:51 AM      Result Value Range Status   MRSA by PCR NEGATIVE  NEGATIVE Final   Comment:            The GeneXpert MRSA Assay (FDA     approved for NASAL specimens     only), is one component of a     comprehensive MRSA colonization     surveillance program. It is not      intended to diagnose MRSA     infection nor to guide or     monitor treatment for     MRSA infections.     Lonia Blood, MD [facility] Office  [phone] Pager [phone]  On-Call/Text Page:      [email or web address]      password [email or web address]  3:03 PM  01/28/2013

## 2013-01-28 NOTE — Progress Notes (Signed)
No active bleeding at this point.  Dr Daphine Deutscher to see in follow up for marginal ulcer.

## 2013-01-28 NOTE — Progress Notes (Signed)
Patient ID: Phillip Maldonado, male   DOB: 1950-03-20, 62 y.o.   MRN: [id number] 1 Day Post-Op  Subjective: Pt feels well today.  No other complaints.  Endo report noted.  No further bleeding  Objective: Vital signs in last 24 hours: Temp:  [97.6 F (36.4 C)-99.5 F (37.5 C)] 98.3 F (36.8 C) (11/14 2326) Pulse Rate:  [66-92] 78 (11/15 0500) Resp:  [16-29] 18 (11/15 0700) BP: (98-159)/(43-81) 99/77 mmHg (11/15 0500) SpO2:  [90 %-100 %] 99 % (11/15 0500) Last BM Date: 01/26/13  Intake/Output from previous day: 11/14 0701 - 11/15 0700 In: 775 [I.V.:450; Blood:325] Out: -  Intake/Output this shift:    PE: Abd: soft, NT, ND  Lab Results:   Recent Labs  01/26/13 0551 01/28/13 0500  WBC 8.7 7.5  HGB 7.4* 8.1*  HCT 23.1* 25.4*  PLT 239 226   BMET  Recent Labs  01/26/13 0551 01/28/13 0500  NA 139 139  K 3.7 3.7  CL 108 107  CO2 20 25  GLUCOSE 180* 130*  BUN 9 8  CREATININE 0.77 0.81  CALCIUM 8.4 8.3*   PT/INR No results found for this basename: LABPROT, INR,  in the last 72 hours CMP     Component Value Date/Time   NA 139 01/28/2013 0500   K 3.7 01/28/2013 0500   CL 107 01/28/2013 0500   CO2 25 01/28/2013 0500   GLUCOSE 130* 01/28/2013 0500   BUN 8 01/28/2013 0500   CREATININE 0.81 01/28/2013 0500   CALCIUM 8.3* 01/28/2013 0500   PROT 5.5* 01/23/2013 1025   ALBUMIN 2.5* 01/23/2013 1025   AST 25 01/23/2013 1025   ALT 22 01/23/2013 1025   ALKPHOS 49 01/23/2013 1025   BILITOT 0.3 01/23/2013 1025   GFRNONAA >90 01/28/2013 0500   GFRAA >90 01/28/2013 0500   Lipase     Component Value Date/Time   LIPASE 25 01/23/2013 0205       Studies/Results: No results found.  Anti-infectives: Anti-infectives   None       Assessment/Plan  1. UGI bleed, likely secondary to marginal ulcer  Plan: 1. Patient has currently stopped bleeding.  Would advance diet as tolerates per GI.  Dr. Bosie Clos has apparently spoken with Dr. Daphine Deutscher who is agreeable to see  the patient as an outpatient for further treatment options. 2. Recommend PPI therapy. 3.  No surgical indications at this time.  Will sign off, please call back if rebleeds.  LOS: 5 days    Krista Som E 01/28/2013, 8:14 AM Pager: [phone]

## 2013-01-28 NOTE — Progress Notes (Addendum)
Patient ID: [name], male   DOB: 14-Sep-1950, 62 y.o.   MRN: [id number] [facility] G[facility]enterology Progress Note  Phillip Maldonado 62 y.o. 06/25/1950   Subjective: Sitting in chair. No complaints. No BMs overnight. Denies abdominal pain.  Objective: Vital signs in last 24 hours: Filed Vitals:   01/28/13 0900  BP: 148/84  Pulse: 131  Temp: 98.4  Resp: 17    Physical Exam: Gen: alert, no acute distress Abd: epigastric tenderness with guarding, soft, nondistended, +BS  Lab Results:  Recent Labs  01/26/13 0551 01/28/13 0500  NA 139 139  K 3.7 3.7  CL 108 107  CO2 20 25  GLUCOSE 180* 130*  BUN 9 8  CREATININE 0.77 0.81  CALCIUM 8.4 8.3*   No results found for this basename: AST, ALT, ALKPHOS, BILITOT, PROT, ALBUMIN,  in the last 72 hours  Recent Labs  01/26/13 0551 01/28/13 0500  WBC 8.7 7.5  HGB 7.4* 8.1*  HCT 23.1* 25.4*  MCV 89.2 89.8  PLT 239 226   No results found for this basename: LABPROT, INR,  in the last 72 hours    Assessment/Plan: S/P upper GI bleed from marginal ulcer at gastrojejunostomy. No further bleeding. Hgb stable at 8.1. Reports initial gastric bypass in 1977 done at [facility] and revision done at [facility] in 1982. Repeat gastric surgery done in 1996 at [facility] but he is not sure what was done. He reports that his "stapling burst" following his initial surgery but records not available at this time. Reports chronic epigastric pain. Tolerating clears. Will change to full liquids. Continue Protonix drip. If Hgb stable over next 1-2 days and tolerating diet, then can see about d/c home Monday and f/u with Dr. Daphine Deutscher in near future to discuss surgical revision of his gastric bypass due to recurrent bleeding. Needs to stay on PPI BID indefinitely and avoid NSAIDs.   Phillip Maldonado C. 01/28/2013, 9:42 AM

## 2013-01-28 NOTE — Anesthesia Postprocedure Evaluation (Signed)
  Anesthesia Post-op Note  Patient: Phillip Maldonado  Procedure(s) Performed: Procedure(s): ESOPHAGOGASTRODUODENOSCOPY (EGD) (N/A)  Patient Location: PACU  Anesthesia Type:General  Level of Consciousness: awake, alert , oriented and patient cooperative  Airway and Oxygen Therapy: Patient Spontanous Breathing and Patient connected to nasal cannula oxygen  Post-op Pain: none  Post-op Assessment: Post-op Vital signs reviewed, Patient's Cardiovascular Status Stable, Respiratory Function Stable, Patent Airway, No signs of Nausea or vomiting and Pain level controlled  Post-op Vital Signs: Reviewed and stable  Complications: No apparent anesthesia complications

## 2013-01-29 LAB — CBC
HCT: 26.5 % — ABNORMAL LOW (ref 39.0–52.0)
Hemoglobin: 8.3 g/dL — ABNORMAL LOW (ref 13.0–17.0)
MCH: 28.2 pg (ref 26.0–34.0)
MCV: 90.1 fL (ref 78.0–100.0)
RDW: 15.7 % — ABNORMAL HIGH (ref 11.5–15.5)
WBC: 7.1 10*3/uL (ref 4.0–10.5)

## 2013-01-29 LAB — GLUCOSE, CAPILLARY
Glucose-Capillary: 131 mg/dL — ABNORMAL HIGH (ref 70–99)
Glucose-Capillary: 193 mg/dL — ABNORMAL HIGH (ref 70–99)
Glucose-Capillary: 130 mg/dL — ABNORMAL HIGH (ref 70–99)
Glucose-Capillary: 120 mg/dL — ABNORMAL HIGH (ref 70–99)

## 2013-01-29 MED ORDER — PANTOPRAZOLE SODIUM 40 MG IV SOLR
40.0000 mg | Freq: Two times a day (BID) | INTRAVENOUS | Status: DC
  Administered 2013-01-29 – 2013-01-30 (×3): 40 mg via INTRAVENOUS
  Filled 2013-01-29 (×3): qty 40

## 2013-01-29 NOTE — Progress Notes (Signed)
Patient ID: Phillip Maldonado, male   DOB: 04-16-1950, 62 y.o.   MRN: [id number] [facility]enterology Progress Note  Phillip Maldonado 62 y.o. 1950-05-30   Subjective: Feels ok. Sitting in chair. Small loose black stool yesterday per chart record. Denies abdominal pain. Tolerating liquids.  Objective: Vital signs in last 24 hours: Filed Vitals:   01/29/13 0747  BP: 135/75  Pulse: 75  Temp: 98.2 F (36.8 C)  Resp: 20    Physical Exam: Gen: alert, no acute distress Abd: epigastric tenderness with guarding, soft, nondistended, obese  Lab Results:  Recent Labs  01/28/13 0500  NA 139  K 3.7  CL 107  CO2 25  GLUCOSE 130*  BUN 8  CREATININE 0.81  CALCIUM 8.3*   No results found for this basename: AST, ALT, ALKPHOS, BILITOT, PROT, ALBUMIN,  in the last 72 hours  Recent Labs  01/28/13 0500 01/29/13 0500  WBC 7.5 7.1  HGB 8.1* 8.3*  HCT 25.4* 26.5*  MCV 89.8 90.1  PLT 226 235   No results found for this basename: LABPROT, INR,  in the last 72 hours    Assessment/Plan: S/P upper GI bleed from marginal ulcer at gastrojejunostomy. Hgb stable at 8.3. Will change to soft diet and advance as tolerated. If Hgb stable and tolerating diet, then ok to d/c tomorrow (01/30/13) and will need close f/u with Phillip Maldonado from [facility] per previous notes. Please call [phone] Monday morning and arrange for him to see Phillip Maldonado and tell them that Phillip Maldonado approved this f/u per discussions with me for him to discuss surgical revision of his gastric bypass due to recurrent bleeding. Change to IV PPI Q 12 hours today and will need to be on PPI PO BID at discharge. Patient wants to see a dietician/nutritionist prior to discharge and will ask Phillip Maldonado to arrange.   Phillip Maldonado C. 01/29/2013, 11:11 AM

## 2013-01-29 NOTE — Progress Notes (Signed)
[facility] Progress Note [place] TEAM 1 - Stepdown ICU Team   Finis Hendricksen YNW:[id number] DOB: 1950/04/19 DOA: 01/23/2013 PCP: No PCP Per Patient  Brief narrative: 62 y.o. male who was admitted 3 weeks prior to this admit for acute GI bleed. EGD at that time was unrevealing - questioned a dieulfoy lesion.  After discharge, he did well until 24 hours prior to [facility] when he started experiencing abdominal pain followed by a large black-colored bowel movement. Patient became very dizzy and weak. When he tried to walk the patient had a syncopal episode. Patient was brought to the ER where he was found to be hypotensive. Hemoglobin was found to be around 6.5 (3 gm lower than prior dc reading).  Assessment/Plan:  UGIB (upper gastrointestinal bleed) -EGD 11/10: large maroon clot seen without actual source of bleeding clarified -Surgery consulted - on stand by in case he re-bleeds -NM Bleeding scan 11/11 unrevealing -Repeat endoscopy 11/14:  "Marginal ulcer noted at area of gastric bypass that is not actively bleeding at this time but at increased risk for bleeding. Adjacent surgical staples and suture material complicating healing of this area and will discuss further with Dr. Daphine Deutscher from bariatric surgery but I think he will need surgical revision in the near future." -cont PPI drip per GI -GI advancing diet  Acute blood loss anemia -due to ulceration near prior gastric bypass sutures -has received 5 units of PRBCs this admit  -poor IV access so CVL placed per PCCM  Atrial Fibrillation/RVR -exacerbated by ABL anemia and deconditioning -rate controlled at this time  -continue metoprolol   HTN -currently well controlled   History "CHF" -no change in tx plan today  -ECHO shows preserved LV function with biatrial dilatation  Diabetes mellitus -resume lantus  -nutrition consult for diet education per pt request  OSA (obstructive sleep apnea) -HS CPAP  History of  gastric bypass -initially done in 1977 and has had 2 revisions since - to f/u w/ Dr. Daphine Deutscher in short course after d/c to discuss revision surgery   Shoulder pain -on methadone pre admit with Neurontin - well controlled at this time   Depression/deconditioning/Chronic pain -Father close to passing - patient concerned about his 2 sons (of whom he just won custody) -on Effexor, Trazadone, and Pristiq -PT/OT recommended home health  DVT prophylaxis: SCDs Code Status: Full Family Communication: Patient Disposition Plan/Expected LOS: Stepdown due to risk for acute complex GI bleeding   Consultants: Gastroenterology Surgery  Procedures: EGD 11/10 EGD 11/14  2D ECHO  Antibiotics: None  HPI/Subjective: No new complaints today.  Has not noted any blood in his stool.  Enjoyed a grilled cheese sandwich earlier today.  Objective: Blood pressure 125/76, pulse 67, temperature 97.4 F (36.3 C), temperature source Oral, resp. rate 16, height 5\' 11"  (1.803 m), weight 178.3 kg (393 lb 1.3 oz), SpO2 99.00%.  Intake/Output Summary (Last 24 hours) at 01/29/13 1329 Last data filed at 01/29/13 0300  Gross per 24 hour  Intake    500 ml  Output      0 ml  Net    500 ml   Exam: General: Pleasant obese male, sitting in chair, in no resp distress Lungs: Clear to auscultation bilaterally without wheezes or crackles Cardiovascular: Regular rate without murmur gallop or rub Abdomen: Nontender, nondistended, soft, bowel sounds positive, no rebound, no ascites, no appreciable mass - obese Musculoskeletal: No significant cyanosis, clubbing of bilateral lower extremities   Scheduled Meds:  Scheduled Meds: . amLODipine  10  mg Oral Daily  . furosemide  40 mg Oral Daily  . insulin aspart  0-15 Units Subcutaneous TID WC  . insulin aspart  0-5 Units Subcutaneous QHS  . methadone  10 mg Oral TID  . metoprolol  50 mg Oral BID  . pantoprazole (PROTONIX) IV  40 mg Intravenous Q12H  . potassium  chloride  10 mEq Oral Daily  . sodium chloride  10 mL Intravenous Q12H  . venlafaxine XR  150 mg Oral Daily   Data Reviewed: Basic Metabolic Panel:  Recent Labs Lab 01/23/13 1025 01/24/13 1019 01/25/13 0500 01/26/13 0551 01/28/13 0500  NA 142 142 134* 139 139  K 4.1 4.0 3.9 3.7 3.7  CL 105 111 102 108 107  CO2 30 23 20 20 25   GLUCOSE 200* 151* 136* 180* 130*  BUN 35* 15 7 9 8   CREATININE 0.93 0.79 0.61 0.77 0.81  CALCIUM 7.8* 8.1* 7.8* 8.4 8.3*   Liver Function Tests:  Recent Labs Lab 01/23/13 0205 01/23/13 1025  AST 27 25  ALT 24 22  ALKPHOS 55 49  BILITOT 0.3 0.3  PROT 5.6* 5.5*  ALBUMIN 2.6* 2.5*   CBC:  Recent Labs Lab 01/23/13 0205  01/24/13 0847 01/25/13 0500 01/26/13 0551 01/28/13 0500 01/29/13 0500  WBC 13.4*  < > 8.0 5.0 8.7 7.5 7.1  NEUTROABS 9.5*  --   --   --   --   --   --   HGB 6.5*  < > 5.7* 13.3 7.4* 8.1* 8.3*  HCT 20.5*  < > 18.1* 37.5* 23.1* 25.4* 26.5*  MCV 91.1  < > 89.2 89.3 89.2 89.8 90.1  PLT 353  < > 248 135* 239 226 235  < > = values in this interval not displayed. CBG:  Recent Labs Lab 01/28/13 1055 01/28/13 1800 01/28/13 2157 01/29/13 0813 01/29/13 1200  GLUCAP 161* 110* 105* 131* 193*    Recent Results (from the past 240 hour(s))  MRSA PCR SCREENING     Status: None   Collection Time    01/23/13  5:51 AM      Result Value Range Status   MRSA by PCR NEGATIVE  NEGATIVE Final   Comment:            The GeneXpert MRSA Assay (FDA     approved for NASAL specimens     only), is one component of a     comprehensive MRSA colonization     surveillance program. It is not     intended to diagnose MRSA     infection nor to guide or     monitor treatment for     MRSA infections.     Lonia Blood, MD [facility] Office  [phone] Pager [phone]  On-Call/Text Page:      [email or web address]      password [email or web address]  1:29 PM  01/29/2013

## 2013-01-30 ENCOUNTER — Telehealth (INDEPENDENT_AMBULATORY_CARE_PROVIDER_SITE_OTHER): Payer: Self-pay

## 2013-01-30 ENCOUNTER — Encounter (HOSPITAL_COMMUNITY): Payer: Self-pay | Admitting: Gastroenterology

## 2013-01-30 LAB — GLUCOSE, CAPILLARY: Glucose-Capillary: 195 mg/dL — ABNORMAL HIGH (ref 70–99)

## 2013-01-30 LAB — CBC
Hemoglobin: 8.5 g/dL — ABNORMAL LOW (ref 13.0–17.0)
MCH: 28.2 pg (ref 26.0–34.0)
MCHC: 31.5 g/dL (ref 30.0–36.0)
Platelets: 241 10*3/uL (ref 150–400)
RDW: 15.6 % — ABNORMAL HIGH (ref 11.5–15.5)

## 2013-01-30 MED ORDER — PANTOPRAZOLE SODIUM 40 MG PO TBEC: 40.0000 mg | DELAYED_RELEASE_TABLET | Freq: Two times a day (BID) | ORAL | Status: DC

## 2013-01-30 MED ORDER — INSULIN GLARGINE 100 UNIT/ML SOLOSTAR PEN: 10.0000 [IU] | PEN_INJECTOR | Freq: Every day | SUBCUTANEOUS | Status: DC

## 2013-01-30 MED ORDER — FUROSEMIDE 40 MG PO TABS: 40.0000 mg | ORAL_TABLET | Freq: Every day | ORAL | Status: DC

## 2013-01-30 MED ORDER — INSULIN PEN NEEDLE 30G X 8 MM MISC: 1.0000 | Status: DC | PRN

## 2013-01-30 MED ORDER — INSULIN GLARGINE 100 UNIT/ML ~~LOC~~ SOLN: 10.0000 [IU] | Freq: Every day | SUBCUTANEOUS | Status: DC

## 2013-01-30 MED ORDER — METOPROLOL TARTRATE 50 MG PO TABS: 50.0000 mg | ORAL_TABLET | Freq: Two times a day (BID) | ORAL | Status: DC

## 2013-01-30 MED ORDER — AMLODIPINE BESYLATE 10 MG PO TABS: 10.0000 mg | ORAL_TABLET | Freq: Every day | ORAL | Status: DC

## 2013-01-30 NOTE — Care Management Note (Addendum)
    Page 1 of 1   01/30/2013     2:35:15 PM   CARE MANAGEMENT NOTE 01/30/2013  Patient:  BRANCH, PACITTI   Account Number:  [id number]  Date Initiated:  01/25/2013  Documentation initiated by:  Raquan Iannone  Subjective/Objective Assessment:   adm with dx of UGI Bleed; lives alone     DC Planning Services  CM consult      [facility] Choice  HOME HEALTH   Choice offered to / List presented to:  C-1 Patient  HH arranged  HH-2 PT  HH-3 OT      [facility] agency  [facility]   Status of service:  Completed, signed off  Discharge Disposition:  HOME W HOME HEALTH SERVICES  Per UR Regulation:  Reviewed for med. necessity/level of care/duration of stay  Comments:  01/30/13 1203 Jacquilyn Seldon RN MSN BSN CCM Pt to d/c on protonix, printed coupon for 30 day supply for Protonix 40 mg BID.  TC to [facility] to ensure they honor coupon and have protonix in stock.  Lantus copay will be $45 for vial or pens.  01/27/13 1356 Maston Wight RN MSN BSN CCM PT recommends SNF but pt states he has gone to SNF for rehab before and will not go back.  Lives alone but sister helps as she is able.  Pt states he had physical therapist named Annice Pih with [facility] and would like same therapist if possible-provided list for pt to review then made referral to liaison per choice.

## 2013-01-30 NOTE — Progress Notes (Signed)
Physical Therapy Treatment Patient Details Name: Phillip Maldonado MRN: [id number] DOB: Apr 23, 1950 Today's Date: 01/30/2013 Time: 7846-9629 PT Time Calculation (min): 28 min  PT Assessment / Plan / Recommendation  History of Present Illness Pt admit with upper GIB.     PT Comments   Pt admitted with above. Pt currently with functional limitations due to endurance deficits. Pt progressing and will get HHPT on d/c.  DGI score 23/24 which suggests pt at low risk of falls with device.  Uses RW at all times. Pt will benefit from skilled PT to increase their independence and safety with mobility to allow discharge to the venue listed below.    Follow Up Recommendations  Home health PT;Supervision - Intermittent;Other (comment)                 Equipment Recommendations  None recommended by PT        Frequency Min 3X/week   Progress towards PT Goals Progress towards PT goals: Progressing toward goals  Plan Current plan remains appropriate    Precautions / Restrictions Precautions Precautions: Fall Restrictions Weight Bearing Restrictions: No   Pertinent Vitals/Pain VSS, No pain    Mobility  Bed Mobility Bed Mobility: Not assessed Transfers Transfers: Sit to Stand;Stand to Sit Sit to Stand: 6: Modified independent (Device/Increase time) Stand to Sit: 6: Modified independent (Device/Increase time) Details for Transfer Assistance: Pt initially ambulated with rW into the bathroom.  Independent taking care of self including cleaning in the bathrroom.   Ambulation/Gait Ambulation/Gait Assistance: 5: Supervision Ambulation Distance (Feet): 200 Feet Assistive device: Rolling walker Ambulation/Gait Assistance Details: Only needed cues to stay close to RW but not unsafe with RW.   Gait Pattern: Step-through pattern;Decreased step length - right;Decreased step length - left;Shuffle;Trunk flexed Gait velocity: decr Stairs: No Wheelchair Mobility Wheelchair Mobility: No    PT Goals  (current goals can now be found in the care plan section)    Visit Information  Last PT Received On: 01/30/13 Assistance Needed: +1 History of Present Illness: Pt admit with upper GIB.      Subjective Data  Subjective: "I feel better and am ready to go home."   Cognition  Cognition Arousal/Alertness: Awake/alert Behavior During Therapy: Flat affect Overall Cognitive Status: Within Functional Limits for tasks assessed    Balance  Static Standing Balance Static Standing - Balance Support: No upper extremity supported Static Standing - Level of Assistance: 5: Stand by assistance Dynamic Standing Balance Dynamic Standing - Balance Support: No upper extremity supported;During functional activity Dynamic Standing - Level of Assistance: 5: Stand by assistance Dynamic Standing - Balance Activities: Other (comment) Dynamic Standing - Comments: used RW for static support Standardized Balance Assessment Standardized Balance Assessment: Dynamic Gait Index Dynamic Gait Index Level Surface: Normal Change in Gait Speed: Normal Gait with Horizontal Head Turns: Normal Gait with Vertical Head Turns: Normal Gait and Pivot Turn: Normal Step Over Obstacle: Normal Step Around Obstacles: Normal Steps: Mild Impairment Total Score: 23  End of Session PT - End of Session Equipment Utilized During Treatment: Gait belt Activity Tolerance: Patient limited by fatigue Patient left: in chair;with call bell/phone within reach Nurse Communication: Mobility status       INGOLD,Princess Karnes 01/30/2013, 11:46 AM [facility] [phone] (pager)

## 2013-01-30 NOTE — Discharge Summary (Signed)
Physician Discharge Summary  Phillip Maldonado ZOX:[id number] DOB: 12-Oct-1950 DOA: 01/23/2013  PCP: No PCP Per Patient  Admit date: 01/23/2013 Discharge date: 01/30/2013  Time spent: >30 minutes  Recommendations for Outpatient Follow-up:  1. Follow up with Dr. Daphine Deutscher  - November 18th at 430 pm 2. Follow up with Dr. Creta Levin in 1-2 weeks regarding your diabetes and HTN  Discharge Diagnoses:    Recurrent severe UGIB (upper gastrointestinal bleed) due to ulcer at area of gastric bypass   Acute blood loss anemia due to GI bleed   Diabetes mellitus-poorly controlled   History of gastric bypass   Atrial fibrillation with RVR   Deconditioning   Morbid obesity   Discharge Condition: stable  Diet recommendation: Carbohydrate modified-mechanical soft  Filed Weights   01/23/13 1910 01/24/13 0429 01/29/13 0436  Weight: 451 lb 15.1 oz (205 kg) 451 lb 15.1 oz (205 kg) 393 lb 1.3 oz (178.3 kg)    History of present illness:  62 y.o. male who was admitted 3 weeks prior to this admit for acute GI bleed. EGD at that time was unrevealing - questioned a dieulfoy lesion. After discharge, he did well until 24 hours prior to [facility] when he started experiencing abdominal pain followed by a large black-colored bowel movement. Patient became very dizzy and weak. When he tried to walk the patient had a syncopal episode. Patient was brought to the ER where he was found to be hypotensive. Hemoglobin was found to be around 6.5 (3 gm lower than prior dc reading).  Hospital Course:   UGIB (upper gastrointestinal bleed)  -EGD 11/10: large maroon clot seen without actual source of bleeding clarified  -Surgery consulted -NM Bleeding scan 11/11 was unrevealing  -Repeat endoscopy 11/14: "Marginal ulcer noted at area of gastric bypass that is not actively bleeding at this time but at increased risk for bleeding. Adjacent surgical staples and suture material complicating healing of this area and will discuss  further with Dr. Daphine Deutscher from bariatric surgery but I think he will need surgical revision in the near future."  -cont PPI BID after discharge -follow up with Dr. Daphine Deutscher after dc as scheduled - anatomy very complex  Acute blood loss anemia  -due to ulceration near prior gastric bypass sutures -has received 5 units of PRBCs this admit  -poor IV access so CVL placed per PCCM this admission- dc'd at time of discharge   Atrial Fibrillation/RVR  -exacerbated by ABL anemia and deconditioning  -rate controlled at this time and has converted to NSR -continue metoprolol  -due to recurrent GI bleeding not a candidate for anticoagulation at this time  HTN  -currently well controlled   History "CHF"  -no change in tx plan today  -ECHO shows preserved LV function with biatrial dilatation  -Lasix decreased to daily this admission  Diabetes mellitus  -resume lantus -pt states never got filled after dc since readmitted soon thereafter- CM assisted; $45 co pay for Solastar pen which pt prefers -cont Metformin and SSI/meal coverage after dc -nutrition consulted for diet education per pt request   OSA (obstructive sleep apnea)  -HS CPAP   History of gastric bypass  -initially done in 1977 and has had 2 revisions since - to f/u w/ Dr. Daphine Deutscher  after d/c to discuss revision surgery - anatomy very complex  Shoulder pain  -on methadone pre admit with Neurontin - well controlled at this time   Depression/deconditioning/Chronic pain  -Father close to passing - patient concerned about his 2 sons (of whom  he just won custody)  -on Effexor, Trazadone, and Pristiq  -PT/OT recommended home health  Procedures: EGD 11/10  EGD 11/14  2D ECHO  Consultations: Gastroenterology  Surgery  Discharge Exam: Filed Vitals:   01/30/13 1200  BP: 151/115  Pulse: 68  Temp: 98.6 F (37 C)  Resp: 17   General: Pleasant obese male, sitting in chair, in no resp distress  Lungs: Clear to auscultation  bilaterally without wheezes or crackles  Cardiovascular: Regular rate without murmur gallop or rub  Abdomen: Nontender, nondistended, soft, bowel sounds positive, no rebound, no ascites, no appreciable mass - obese  Musculoskeletal: No significant cyanosis, clubbing of bilateral lower extremities  Discharge Instructions      Discharge Orders   Future Appointments Provider Department Dept Phone   01/31/2013 4:30 PM Valarie Merino, MD [facility] [place] [phone]   Future Orders Complete By Expires   Call MD for:  extreme fatigue  As directed    Call MD for:  persistant dizziness or light-headedness  As directed    Call MD for:  persistant nausea and vomiting  As directed    Call MD for:  As directed    Scheduling Instructions:     Recurrence of bleeding   Diet Carb Modified  As directed    Scheduling Instructions:     Mechanical soft   Home Health  As directed    Questions:     To provide the following care/treatments:  PT   Home Health  As directed    Questions:     To provide the following care/treatments:  OT   Increase activity slowly  As directed    Scheduling Instructions:     Home Health PT/OT       Medication List    STOP taking these medications       insulin glargine 100 UNIT/ML injection  Commonly known as:  LANTUS  Replaced by:  Insulin Glargine 100 UNIT/ML Sopn      TAKE these medications       amLODipine 10 MG tablet  Commonly known as:  NORVASC  Take 1 tablet (10 mg total) by mouth daily.     ANDROGEL PUMP 12.5 MG/ACT (1%) Gel  Generic drug:  Testosterone  Place 1 application onto the skin daily.     atorvastatin 40 MG tablet  Commonly known as:  LIPITOR  Take 40 mg by mouth daily.     furosemide 40 MG tablet  Commonly known as:  LASIX  Take 1 tablet (40 mg total) by mouth daily. Start this dose 01/18/13     gabapentin 800 MG tablet  Commonly known as:  NEURONTIN  Take 800-1,600 mg by mouth See admin instructions. 1600mg  in  the morning, 800mg  in the afternoon, and 1600mg  at night.     HYDROmorphone 2 MG tablet  Commonly known as:  DILAUDID  Take 4 mg by mouth 3 (three) times daily as needed for severe pain.     insulin aspart 100 UNIT/ML injection  Commonly known as:  novoLOG  - 0-15 Units, Subcutaneous, 3 times daily with meals  - CBG < 70: implement hypoglycemia protocol  - CBG 70 - 120: 0 units  - CBG 121 - 150: 2 units  - CBG 151 - 200: 3 units  - CBG 201 - 250: 5 units  - CBG 251 - 300: 8 units  - CBG 301 - 350: 11 units  - CBG 351 - 400: 15 units  - CBG >  400     Insulin Glargine 100 UNIT/ML Sopn  Commonly known as:  LANTUS SOLOSTAR  Inject 10 Units into the skin daily.     Insulin Pen Needle 30G X 8 MM Misc  Commonly known as:  NOVOFINE  Inject 10 each into the skin as needed.     metFORMIN 500 MG tablet  Commonly known as:  GLUCOPHAGE  Take 500 mg by mouth daily with breakfast.     methadone 10 MG tablet  Commonly known as:  DOLOPHINE  Take 1 tablet (10 mg total) by mouth 3 (three) times daily.     metoprolol 50 MG tablet  Commonly known as:  LOPRESSOR  Take 1 tablet (50 mg total) by mouth 2 (two) times daily.     multivitamin with minerals Tabs tablet  Take 1 tablet by mouth daily.     nitroGLYCERIN 0.4 MG SL tablet  Commonly known as:  NITROSTAT  Place 0.4 mg under the tongue every 5 (five) minutes as needed. For chest pain     pantoprazole 40 MG tablet  Commonly known as:  PROTONIX  Take 1 tablet (40 mg total) by mouth 2 (two) times daily.     polyvinyl alcohol 1.4 % ophthalmic solution  Commonly known as:  LIQUIFILM TEARS  Place 1 drop into both eyes daily as needed. For dry eyes     potassium chloride SA 20 MEQ tablet  Commonly known as:  K-DUR,KLOR-CON  Take 1 tablet (20 mEq total) by mouth 2 (two) times daily.     traZODone 100 MG tablet  Commonly known as:  DESYREL  Take 300 mg by mouth at bedtime.     venlafaxine XR 150 MG 24 hr capsule  Commonly  known as:  EFFEXOR-XR  Take 150 mg by mouth daily.       Allergies  Allergen Reactions  . Adhesive [Tape] Other (See Comments)    unknown  . Feldene [Piroxicam] Other (See Comments)    unknown  . Latex     Rash and itching   Follow-up Information   Follow up with Valarie Merino, MD On 01/31/2013. (Arrive no later than 415 for 430 appt-you are an add on/if the office needs to change the appointment time they will call you)    Specialty:  General Surgery   Contact information:   [address] [place] [address] [phone]      Microbiology: Recent Results (from the past 240 hour(s))  MRSA PCR SCREENING     Status: None   Collection Time    01/23/13  5:51 AM      Result Value Range Status   MRSA by PCR NEGATIVE  NEGATIVE Final   Comment:            The GeneXpert MRSA Assay (FDA     approved for NASAL specimens     only), is one component of a     comprehensive MRSA colonization     surveillance program. It is not     intended to diagnose MRSA     infection nor to guide or     monitor treatment for     MRSA infections.    Labs: Basic Metabolic Panel:  Recent Labs Lab 01/24/13 1019 01/25/13 0500 01/26/13 0551 01/28/13 0500  NA 142 134* 139 139  K 4.0 3.9 3.7 3.7  CL 111 102 108 107  CO2 23 20 20 25   GLUCOSE 151* 136* 180* 130*  BUN 15 7  9 8  CREATININE 0.79 0.61 0.77 0.81  CALCIUM 8.1* 7.8* 8.4 8.3*   CBC:  Recent Labs Lab 01/25/13 0500 01/26/13 0551 01/28/13 0500 01/29/13 0500 01/30/13 0930  WBC 5.0 8.7 7.5 7.1 8.2  HGB 13.3 7.4* 8.1* 8.3* 8.5*  HCT 37.5* 23.1* 25.4* 26.5* 27.0*  MCV 89.3 89.2 89.8 90.1 89.7  PLT 135* 239 226 235 241   BNP: BNP (last 3 results)  Recent Labs  01/16/13 1604  PROBNP 147.6*   CBG:  Recent Labs Lab 01/29/13 0813 01/29/13 1200 01/29/13 1616 01/29/13 2149 01/30/13 1337  GLUCAP 131* 193* 130* 120* 195*    Signed:  ELLIS,ALLISON L. ANP  [facility] 01/30/2013, 1:50 PM  I  have personally examined this patient and reviewed the entire database. I have reviewed the above note, made any necessary editorial changes, and agree with its content.  Lonia Blood, MD [facility]

## 2013-01-30 NOTE — Progress Notes (Signed)
Patient ID: Phillip Maldonado, male   DOB: Aug 04, 1950, 62 y.o.   MRN: [id number] [facility] Gastroenterology Progress Note  Phillip Maldonado 62 y.o. [birth year]   Subjective: Sitting in chair eating breakfast. Feels good. Two small black stools in past 24 hours. Denies abdominal pain.  Objective: Vital signs: Filed Vitals:   01/30/13 0419  BP: 97/40  Pulse: 64  Temp: 98.5 F (36.9 C)  Resp: 19    Physical Exam: Gen: alert, no acute distress, morbidly obese Abd: epigastric tenderness with voluntary guarding, soft, nondistended, +BS  Lab Results:  Recent Labs  01/28/13 0500  NA 139  K 3.7  CL 107  CO2 25  GLUCOSE 130*  BUN 8  CREATININE 0.81  CALCIUM 8.3*   No results found for this basename: AST, ALT, ALKPHOS, BILITOT, PROT, ALBUMIN,  in the last 72 hours  Recent Labs  01/28/13 0500 01/29/13 0500  WBC 7.5 7.1  HGB 8.1* 8.3*  HCT 25.4* 26.5*  MCV 89.8 90.1  PLT 226 235      Assessment/Plan: 62 yo s/p upper GI bleed from marginal ulcer at gastrojejunostomy. Black stools likely residual blood. Waiting on today's CBC and nurse aware it is needed prior to discharge. If Hgb stable, then ok to d/c today with close f/u with Dr. Daphine Deutscher (see yesterday's note). Needs PPI PO BID. Will sign off. Call if questions.   Servando Kyllonen C. 01/30/2013, 9:19 AM

## 2013-01-30 NOTE — Plan of Care (Signed)
Problem: Limited Adherence to Nutrition-Related Recommendations (NB-1.6) Goal: Nutrition education Formal process to instruct or train a patient/client in a skill or to impart knowledge to help patients/clients voluntarily manage or modify food choices and eating behavior to maintain or improve health. Outcome: Completed/Met Date Met:  01/30/13  RD consulted for nutrition education regarding diabetes.     Lab Results  Component Value Date    HGBA1C 8.5* 01/02/2013    RD provided "Carbohydrate Counting for People with Diabetes" handout from the Academy of Nutrition and Dietetics. Discussed different food groups and their effects on blood sugar, emphasizing carbohydrate-containing foods. Provided list of carbohydrates and recommended serving sizes of common foods.  Discussed importance of controlled and consistent carbohydrate intake throughout the day. Provided examples of ways to balance meals/snacks and encouraged intake of high-fiber, whole grain complex carbohydrates. Teach back method used.  Expect fair compliance.  Body mass index is 54.85 kg/(m^2). Pt meets criteria for Obesity Class III based on current BMI.  Current diet order is Dysphagia 3, Carbohydrate Modified Medium Calorie; patient is consuming approximately 100% of meals at this time. Labs and medications reviewed. No further nutrition interventions warranted at this time. RD contact information provided. If additional nutrition issues arise, please re-consult RD.  Maureen Chatters, RD, LDN Pager #: [phone] After-Hours Pager #: [phone]

## 2013-01-30 NOTE — Telephone Encounter (Signed)
Hospitalist calling to schedule this patient to see Dr. Daphine Deutscher for evaluation of gastric bypass.  Pt scheduled for 01/31/13. Message relayed to [facility], his nurse.

## 2013-01-30 NOTE — Progress Notes (Signed)
Occupational Therapy Treatment Patient Details Name: Hesston Hitchens MRN: [id number] DOB: 11-Feb-1951 Today's Date: 01/30/2013 Time: 8119-1478 OT Time Calculation (min): 17 min  OT Assessment / Plan / Recommendation  History of present illness Pt admit with upper GIB.     OT comments  Pt performing ADL with AE at a modified independent level.  Improvement noted in endurance.  Did not practice tub transfer.  Pt agreeable to waiting for [facility] therapist or [facility] worker before attempting to shower at home.    Follow Up Recommendations  Supervision - Intermittent, HHOT   Barriers to Discharge       Equipment Recommendations  None recommended by OT    Recommendations for Other Services    Frequency Min 2X/week   Progress towards OT Goals Progress towards OT goals: Progressing toward goals  Plan Discharge plan needs to be updated    Precautions / Restrictions Precautions Precautions: Fall Restrictions Weight Bearing Restrictions: No   Pertinent Vitals/Pain VSS, no c/o pain   ADL  Grooming: Wash/dry hands;Wash/dry face;Brushing hair;Modified independent Where Assessed - Grooming: Unsupported standing Upper Body Bathing: Modified independent Where Assessed - Upper Body Bathing: Unsupported sitting Lower Body Bathing: Modified independent Where Assessed - Lower Body Bathing: Supported sit to stand Upper Body Dressing: Modified independent Where Assessed - Upper Body Dressing: Unsupported sitting Lower Body Dressing: Modified independent Where Assessed - Lower Body Dressing: Supported sit to Pharmacist, hospital: Modified independent Statistician Method: Sit to Barista: Comfort height toilet;Grab bars Toileting - Architect and Hygiene: Modified independent Where Assessed - Engineer, mining and Hygiene: Sit to stand from 3-in-1 or toilet Equipment Used: Rolling walker;Reacher;Long-handled sponge;Long-handled shoe horn;Sock  aid Transfers/Ambulation Related to ADLs: mod I with RW in room and bathroom ADL Comments: Issued and reviewed use of AE for ADL.  Pt very appreciative.  Unable to afford items not covered by insurance.    OT Diagnosis:    OT Problem List:   OT Treatment Interventions:     OT Goals(current goals can now be found in the care plan section) Acute Rehab OT Goals Patient Stated Goal: To get stronger so he can eventually get home.  Visit Information  Last OT Received On: 01/30/13 Assistance Needed: +1 History of Present Illness: Pt admit with upper GIB.      Subjective Data      Prior Functioning       Cognition  Cognition Arousal/Alertness: Awake/alert Behavior During Therapy: Flat affect Overall Cognitive Status: Within Functional Limits for tasks assessed    Mobility  Bed Mobility Bed Mobility: Not assessed Transfers Transfers: Sit to Stand;Stand to Sit Sit to Stand: 6: Modified independent (Device/Increase time) Stand to Sit: 6: Modified independent (Device/Increase time) Details for Transfer Assistance: Pt initially ambulated with rW into the bathroom.  Independent taking care of self including cleaning in the bathrroom.      Exercises      Balance Static Standing Balance Static Standing - Balance Support: No upper extremity supported Static Standing - Level of Assistance: 5: Stand by assistance Dynamic Standing Balance Dynamic Standing - Balance Support: No upper extremity supported;During functional activity Dynamic Standing - Level of Assistance: 5: Stand by assistance Dynamic Standing - Balance Activities: Other (comment) Dynamic Standing - Comments: used RW for static support   End of Session OT - End of Session Activity Tolerance: Patient tolerated treatment well Patient left: in chair;with call bell/phone within reach  GO     Evern Bio 01/30/2013, 12:53  PM [phone]

## 2013-01-31 ENCOUNTER — Encounter (INDEPENDENT_AMBULATORY_CARE_PROVIDER_SITE_OTHER): Payer: Self-pay | Admitting: Surgery

## 2013-01-31 ENCOUNTER — Ambulatory Visit (INDEPENDENT_AMBULATORY_CARE_PROVIDER_SITE_OTHER): Payer: Medicare Other | Admitting: Surgery

## 2013-01-31 VITALS — BP 134/78 | HR 80 | Temp 98.0°F | Resp 18 | Ht 71.0 in | Wt 389.0 lb

## 2013-01-31 DIAGNOSIS — Z6841 Body Mass Index (BMI) 40.0 and over, adult: Secondary | ICD-10-CM

## 2013-01-31 DIAGNOSIS — Z9884 Bariatric surgery status: Secondary | ICD-10-CM

## 2013-01-31 DIAGNOSIS — K289 Gastrojejunal ulcer, unspecified as acute or chronic, without hemorrhage or perforation: Secondary | ICD-10-CM | POA: Insufficient documentation

## 2013-01-31 LAB — GLUCOSE, CAPILLARY
Glucose-Capillary: 166 mg/dL — ABNORMAL HIGH (ref 70–99)
Glucose-Capillary: 147 mg/dL — ABNORMAL HIGH (ref 70–99)

## 2013-01-31 MED ORDER — SUCRALFATE 1 GM/10ML PO SUSP: 1.0000 g | Freq: Three times a day (TID) | ORAL | Status: DC

## 2013-01-31 NOTE — Patient Instructions (Signed)
Thanks for your patience.  If you need further assistance after leaving the office, please call our office and speak with a [facility] nurse.  [phone].  If you want to leave a message for Dr. Daphine Deutscher, please call his office phone at [phone].  Please obtain Op Note from Dr. Rocky [facility] in [place].

## 2013-01-31 NOTE — Progress Notes (Signed)
Chief Complaint:  Upper gastrointestinal bleeding  History of Present Illness:  Phillip Maldonado is an 63 y.o. male who was recently admitted and treated for an upper GI bleed. I spoke with the Doy Mince  about him last week after he endoscoped him.  The anatomy was very confusing but appears at Phillip Maldonado has had 3 surgeries. In the 70s he had a stapling procedure by Dr. Elpidio Eric at [facility] and then had revisional surgery at [facility] and then in 1996 had bypass surgery by Margo Common at [facility] in [place].  Phillip Maldonado and had some problems with multiple back surgeries and a recent marginal ulcer with GI bleeding.  I discussed adding Carafate to his regimen which includes Protonix and wrote him a prescription for this.  Plan is to go on Carafate for about 3 weeks. This is in addition to his Protonix. He is afraid of recurrent bleeding. I told him that we would see him back in 6 weeks. In the meantime I want him to have a folks appear to cannulate produced Dr. McDonnell's operative notes a week and no bloody found on the operating.  Past Medical History  Diagnosis Date  . CHF (congestive heart failure)   . Hypertension   . Anemia   . Sleep apnea, obstructive     does where bipap  . Angina   . Shortness of breath   . Anxiety   . Blood transfusion   . Headache(784.0)   . Pneumonia   . Depression   . Hepatitis Hep B  . Neuromuscular disorder DJD  . Arthritis   . Diabetes mellitus 09/18/2011  . Atrial fibrillation with RVR 01/24/2013    Past Surgical History  Procedure Laterality Date  . Back surgery    . Stomach surgery      Gastric [phone]; Reversed in 1992 and revision 1994  . Diagnostic laparoscopy    . Esophagoscopy N/A 01/01/2013    Procedure: ESOPHAGOSCOPY;  Surgeon: Petra Kuba, MD;  Location: [facility] OR;  Service: Endoscopy;  Laterality: N/A;  . Esophagogastroduodenoscopy N/A 01/03/2013    Procedure: ESOPHAGOGASTRODUODENOSCOPY (EGD);  Surgeon: Willis Modena, MD;   Location: [facility] ORS;  Service: Endoscopy;  Laterality: N/A;  . Esophagogastroduodenoscopy N/A 01/03/2013    Procedure: ESOPHAGOGASTRODUODENOSCOPY (EGD);  Surgeon: Willis Modena, MD;  Location: [facility];  Service: Endoscopy;  Laterality: N/A;  . Esophagogastroduodenoscopy N/A 01/23/2013    Procedure: ESOPHAGOGASTRODUODENOSCOPY (EGD);  Surgeon: Shirley Friar, MD;  Location: [facility] ENDOSCOPY;  Service: Endoscopy;  Laterality: N/A;  . Esophagogastroduodenoscopy Left 01/27/2013    Procedure: ESOPHAGOGASTRODUODENOSCOPY (EGD);  Surgeon: Shirley Friar, MD;  Location: [facility] OR;  Service: Endoscopy;  Laterality: Left;    Current Outpatient Prescriptions  Medication Sig Dispense Refill  . amLODipine (NORVASC) 10 MG tablet Take 1 tablet (10 mg total) by mouth daily.  30 tablet  6  . atorvastatin (LIPITOR) 40 MG tablet Take 40 mg by mouth daily.      . furosemide (LASIX) 40 MG tablet Take 1 tablet (40 mg total) by mouth daily. Start this dose 01/18/13  60 tablet  0  . gabapentin (NEURONTIN) 800 MG tablet Take 800-1,600 mg by mouth See admin instructions. 1600mg  in the morning, 800mg  in the afternoon, and 1600mg  at night.      . insulin aspart (NOVOLOG) 100 UNIT/ML injection 0-15 Units, Subcutaneous, 3 times daily with meals CBG < 70: implement hypoglycemia protocol CBG 70 - 120: 0 units CBG 121 - 150: 2 units CBG 151 -  200: 3 units CBG 201 - 250: 5 units CBG 251 - 300: 8 units CBG 301 - 350: 11 units CBG 351 - 400: 15 units CBG > 400  1 vial  12  . Insulin Glargine (LANTUS SOLOSTAR) 100 UNIT/ML SOPN Inject 10 Units into the skin daily.  1 pen  1  . Insulin Pen Needle (NOVOFINE) 30G X 8 MM MISC Inject 10 each into the skin as needed.  100 each  0  . metFORMIN (GLUCOPHAGE) 500 MG tablet Take 500 mg by mouth daily with breakfast.      . methadone (DOLOPHINE) 10 MG tablet Take 1 tablet (10 mg total) by mouth 3 (three) times daily.  30 tablet  0  . metoprolol (LOPRESSOR) 50 MG tablet Take 1 tablet  (50 mg total) by mouth 2 (two) times daily.  60 tablet  6  . Multiple Vitamin (MULITIVITAMIN WITH MINERALS) TABS Take 1 tablet by mouth daily.      . nitroGLYCERIN (NITROSTAT) 0.4 MG SL tablet Place 0.4 mg under the tongue every 5 (five) minutes as needed. For chest pain      . pantoprazole (PROTONIX) 40 MG tablet Take 1 tablet (40 mg total) by mouth 2 (two) times daily.  60 tablet  2  . polyvinyl alcohol (LIQUIFILM TEARS) 1.4 % ophthalmic solution Place 1 drop into both eyes daily as needed. For dry eyes      . potassium chloride SA (K-DUR,KLOR-CON) 20 MEQ tablet Take 1 tablet (20 mEq total) by mouth 2 (two) times daily.  3 tablet  6  . Testosterone (ANDROGEL PUMP) 1.25 GM/ACT (1%) GEL Place 1 application onto the skin daily.      . traZODone (DESYREL) 100 MG tablet Take 300 mg by mouth at bedtime.       Marland Kitchen venlafaxine XR (EFFEXOR-XR) 150 MG 24 hr capsule Take 150 mg by mouth daily.      Marland Kitchen HYDROmorphone (DILAUDID) 2 MG tablet Take 4 mg by mouth 3 (three) times daily as needed for severe pain.      Marland Kitchen sucralfate (CARAFATE) 1 GM/10ML suspension Take 10 mLs (1 g total) by mouth 4 (four) times daily -  with meals and at bedtime.  420 mL  1   No current facility-administered medications for this visit.   Adhesive; Feldene; and Latex Family History  Problem Relation Age of Onset  . Heart disease Father   . Skin cancer Father   . COPD Father   . Hypertension Father   . Heart disease Maternal Uncle    Social History:   reports that he has never smoked. He does not have any smokeless tobacco history on file. He reports that he does not drink alcohol or use illicit drugs.   REVIEW OF SYSTEMS - PERTINENT POSITIVES ONLY: Noncontributory and complicated  Physical Exam:   Blood pressure 134/78, pulse 80, temperature 98 F (36.7 C), resp. rate 18, height 5\' 11"  (1.803 m), weight 389 lb (176.449 kg). Body mass index is 54.28 kg/(m^2).  Gen:  WDWN white male NAD  Neurological: Alert and oriented to  person, place, and time. Motor and sensory function is grossly intact  Head: Normocephalic and atraumatic.  Eyes: Conjunctivae are normal. Pupils are equal, round, and reactive to light. No scleral icterus.  Neck: Normal range of motion. Neck supple. No tracheal deviation or thyromegaly present.  Cardiovascular:  SR without murmurs or gallops.  No carotid bruits Respiratory: Effort normal.  No respiratory distress. No chest wall tenderness. Breath  sounds normal.  No wheezes, rales or rhonchi.  Abdomen:  Obese with multiple scars GU: Musculoskeletal: Normal range of motion. Extremities are nontender. No cyanosis, edema or clubbing noted Lymphadenopathy: No cervical, preauricular, postauricular or axillary adenopathy is present Skin: Skin is warm and dry. No rash noted. No diaphoresis. No erythema. No pallor. Pscyh: Normal mood and affect. Behavior is normal. Judgment and thought content normal.   LABORATORY RESULTS: Results for orders placed during the hospital encounter of 01/23/13 (from the past 48 hour(s))  GLUCOSE, CAPILLARY     Status: Abnormal   Collection Time    01/29/13  9:49 PM      Result Value Range   Glucose-Capillary 120 (*) 70 - 99 mg/dL  GLUCOSE, CAPILLARY     Status: Abnormal   Collection Time    01/30/13  7:53 AM      Result Value Range   Glucose-Capillary 166 (*) 70 - 99 mg/dL  CBC     Status: Abnormal   Collection Time    01/30/13  9:30 AM      Result Value Range   WBC 8.2  4.0 - 10.5 K/uL   RBC 3.01 (*) 4.22 - 5.81 MIL/uL   Hemoglobin 8.5 (*) 13.0 - 17.0 g/dL   HCT 16.1 (*) 09.6 - 04.5 %   MCV 89.7  78.0 - 100.0 fL   MCH 28.2  26.0 - 34.0 pg   MCHC 31.5  30.0 - 36.0 g/dL   RDW 40.9 (*) 81.1 - 91.4 %   Platelets 241  150 - 400 K/uL  GLUCOSE, CAPILLARY     Status: Abnormal   Collection Time    01/30/13 12:08 PM      Result Value Range   Glucose-Capillary 147 (*) 70 - 99 mg/dL  GLUCOSE, CAPILLARY     Status: Abnormal   Collection Time    01/30/13  1:37 PM       Result Value Range   Glucose-Capillary 195 (*) 70 - 99 mg/dL    RADIOLOGY RESULTS: No results found.  Problem List: Patient Active Problem List   Diagnosis Date Noted  . Atrial fibrillation with RVR 01/24/2013  . Acute blood loss anemia 01/02/2013  . History of gastric bypass 01/02/2013  . UGIB (upper gastrointestinal bleed) 01/01/2013  . HTN (hypertension) 09/18/2011  . Diabetes mellitus 09/18/2011  . Hypercholesterolemia 09/18/2011  . OSA (obstructive sleep apnea) 09/18/2011  . Chest pain 05/09/2011  . Hypokalemia 05/09/2011  . Constipation 05/09/2011    Assessment & Plan: 2 prior gastric surgeries for weight loss the most recent with a bypass that is complicated by marginal ulceration bleeding. Plan treat the marginal ulcers, get the op note to help establish the anatomy and entertained whether any further medial surgery is indicated.    Matt B. Daphine Deutscher, MD, [facility] [phone] beeper [phone]  01/31/2013 6:23 PM

## 2013-02-24 ENCOUNTER — Ambulatory Visit (HOSPITAL_COMMUNITY): Payer: Medicare Other | Admitting: Psychiatry

## 2013-03-20 ENCOUNTER — Encounter (HOSPITAL_COMMUNITY): Payer: Self-pay | Admitting: Anesthesiology

## 2013-03-24 ENCOUNTER — Encounter (INDEPENDENT_AMBULATORY_CARE_PROVIDER_SITE_OTHER): Payer: Medicare Other | Admitting: Surgery

## 2013-05-10 ENCOUNTER — Encounter (INDEPENDENT_AMBULATORY_CARE_PROVIDER_SITE_OTHER): Payer: Medicare Other | Admitting: Surgery

## 2013-05-12 ENCOUNTER — Encounter (INDEPENDENT_AMBULATORY_CARE_PROVIDER_SITE_OTHER): Payer: Self-pay | Admitting: Surgery

## 2013-09-15 ENCOUNTER — Encounter (HOSPITAL_COMMUNITY): Payer: Self-pay | Admitting: Emergency Medicine

## 2013-09-15 ENCOUNTER — Emergency Department (HOSPITAL_COMMUNITY): Payer: Medicare Other

## 2013-09-15 ENCOUNTER — Observation Stay (HOSPITAL_COMMUNITY)
Admission: EM | Admit: 2013-09-15 | Discharge: 2013-09-17 | Disposition: A | Discharge status: Home / Self Care | Payer: Medicare Other | Attending: Internal Medicine | Admitting: Internal Medicine

## 2013-09-15 DIAGNOSIS — R079 Chest pain, unspecified: Secondary | ICD-10-CM

## 2013-09-15 DIAGNOSIS — Z91040 Latex allergy status: Secondary | ICD-10-CM | POA: Insufficient documentation

## 2013-09-15 DIAGNOSIS — Z8669 Personal history of other diseases of the nervous system and sense organs: Secondary | ICD-10-CM | POA: Insufficient documentation

## 2013-09-15 DIAGNOSIS — M7989 Other specified soft tissue disorders: Secondary | ICD-10-CM | POA: Insufficient documentation

## 2013-09-15 DIAGNOSIS — G4733 Obstructive sleep apnea (adult) (pediatric): Secondary | ICD-10-CM | POA: Diagnosis present

## 2013-09-15 DIAGNOSIS — E089 Diabetes mellitus due to underlying condition without complications: Secondary | ICD-10-CM

## 2013-09-15 DIAGNOSIS — Z8739 Personal history of other diseases of the musculoskeletal system and connective tissue: Secondary | ICD-10-CM | POA: Insufficient documentation

## 2013-09-15 DIAGNOSIS — K289 Gastrojejunal ulcer, unspecified as acute or chronic, without hemorrhage or perforation: Secondary | ICD-10-CM

## 2013-09-15 DIAGNOSIS — F329 Major depressive disorder, single episode, unspecified: Secondary | ICD-10-CM | POA: Insufficient documentation

## 2013-09-15 DIAGNOSIS — D62 Acute posthemorrhagic anemia: Secondary | ICD-10-CM

## 2013-09-15 DIAGNOSIS — K921 Melena: Secondary | ICD-10-CM | POA: Insufficient documentation

## 2013-09-15 DIAGNOSIS — Z862 Personal history of diseases of the blood and blood-forming organs and certain disorders involving the immune mechanism: Secondary | ICD-10-CM | POA: Insufficient documentation

## 2013-09-15 DIAGNOSIS — R300 Dysuria: Secondary | ICD-10-CM | POA: Insufficient documentation

## 2013-09-15 DIAGNOSIS — R5381 Other malaise: Secondary | ICD-10-CM | POA: Insufficient documentation

## 2013-09-15 DIAGNOSIS — I509 Heart failure, unspecified: Secondary | ICD-10-CM | POA: Insufficient documentation

## 2013-09-15 DIAGNOSIS — I4891 Unspecified atrial fibrillation: Secondary | ICD-10-CM

## 2013-09-15 DIAGNOSIS — R55 Syncope and collapse: Principal | ICD-10-CM | POA: Diagnosis present

## 2013-09-15 DIAGNOSIS — F3289 Other specified depressive episodes: Secondary | ICD-10-CM | POA: Insufficient documentation

## 2013-09-15 DIAGNOSIS — K922 Gastrointestinal hemorrhage, unspecified: Secondary | ICD-10-CM | POA: Diagnosis present

## 2013-09-15 DIAGNOSIS — R42 Dizziness and giddiness: Secondary | ICD-10-CM | POA: Insufficient documentation

## 2013-09-15 DIAGNOSIS — G8929 Other chronic pain: Secondary | ICD-10-CM | POA: Insufficient documentation

## 2013-09-15 DIAGNOSIS — I10 Essential (primary) hypertension: Secondary | ICD-10-CM | POA: Diagnosis present

## 2013-09-15 DIAGNOSIS — R9431 Abnormal electrocardiogram [ECG] [EKG]: Secondary | ICD-10-CM | POA: Insufficient documentation

## 2013-09-15 DIAGNOSIS — G894 Chronic pain syndrome: Secondary | ICD-10-CM | POA: Diagnosis present

## 2013-09-15 DIAGNOSIS — R109 Unspecified abdominal pain: Secondary | ICD-10-CM | POA: Insufficient documentation

## 2013-09-15 DIAGNOSIS — I209 Angina pectoris, unspecified: Secondary | ICD-10-CM | POA: Insufficient documentation

## 2013-09-15 DIAGNOSIS — E876 Hypokalemia: Secondary | ICD-10-CM

## 2013-09-15 DIAGNOSIS — R0602 Shortness of breath: Secondary | ICD-10-CM | POA: Insufficient documentation

## 2013-09-15 DIAGNOSIS — E7800 Pure hypercholesterolemia, unspecified: Secondary | ICD-10-CM

## 2013-09-15 DIAGNOSIS — Z8719 Personal history of other diseases of the digestive system: Secondary | ICD-10-CM | POA: Insufficient documentation

## 2013-09-15 DIAGNOSIS — E119 Type 2 diabetes mellitus without complications: Secondary | ICD-10-CM | POA: Insufficient documentation

## 2013-09-15 DIAGNOSIS — Z888 Allergy status to other drugs, medicaments and biological substances status: Secondary | ICD-10-CM | POA: Insufficient documentation

## 2013-09-15 DIAGNOSIS — F411 Generalized anxiety disorder: Secondary | ICD-10-CM | POA: Insufficient documentation

## 2013-09-15 DIAGNOSIS — R350 Frequency of micturition: Secondary | ICD-10-CM | POA: Insufficient documentation

## 2013-09-15 DIAGNOSIS — I872 Venous insufficiency (chronic) (peripheral): Secondary | ICD-10-CM | POA: Insufficient documentation

## 2013-09-15 DIAGNOSIS — Z794 Long term (current) use of insulin: Secondary | ICD-10-CM | POA: Insufficient documentation

## 2013-09-15 DIAGNOSIS — Z8701 Personal history of pneumonia (recurrent): Secondary | ICD-10-CM | POA: Insufficient documentation

## 2013-09-15 DIAGNOSIS — M549 Dorsalgia, unspecified: Secondary | ICD-10-CM | POA: Insufficient documentation

## 2013-09-15 DIAGNOSIS — R5383 Other fatigue: Secondary | ICD-10-CM

## 2013-09-15 DIAGNOSIS — Z79899 Other long term (current) drug therapy: Secondary | ICD-10-CM | POA: Insufficient documentation

## 2013-09-15 DIAGNOSIS — R110 Nausea: Secondary | ICD-10-CM | POA: Insufficient documentation

## 2013-09-15 DIAGNOSIS — Z9884 Bariatric surgery status: Secondary | ICD-10-CM

## 2013-09-15 HISTORY — DX: Disorder of kidney and ureter, unspecified: N28.9

## 2013-09-15 LAB — URINALYSIS, ROUTINE W REFLEX MICROSCOPIC
Bilirubin Urine: NEGATIVE
Glucose, UA: NEGATIVE mg/dL
Hgb urine dipstick: NEGATIVE
KETONES UR: 15 mg/dL — AB
LEUKOCYTES UA: NEGATIVE
NITRITE: NEGATIVE
PROTEIN: NEGATIVE mg/dL
Specific Gravity, Urine: 1.02 (ref 1.005–1.030)
Urobilinogen, UA: 1 mg/dL (ref 0.0–1.0)
pH: 7 (ref 5.0–8.0)

## 2013-09-15 LAB — CBC WITH DIFFERENTIAL/PLATELET
Basophils Absolute: 0.1 10*3/uL (ref 0.0–0.1)
Basophils Relative: 1 % (ref 0–1)
EOS ABS: 0.2 10*3/uL (ref 0.0–0.7)
EOS PCT: 3 % (ref 0–5)
HEMATOCRIT: 37.4 % — AB (ref 39.0–52.0)
Hemoglobin: 11.2 g/dL — ABNORMAL LOW (ref 13.0–17.0)
LYMPHS PCT: 22 % (ref 12–46)
Lymphs Abs: 1.9 10*3/uL (ref 0.7–4.0)
MCH: 22.5 pg — ABNORMAL LOW (ref 26.0–34.0)
MCHC: 29.9 g/dL — AB (ref 30.0–36.0)
MCV: 75.3 fL — AB (ref 78.0–100.0)
MONO ABS: 0.7 10*3/uL (ref 0.1–1.0)
Monocytes Relative: 8 % (ref 3–12)
Neutro Abs: 5.8 10*3/uL (ref 1.7–7.7)
Neutrophils Relative %: 66 % (ref 43–77)
PLATELETS: 328 10*3/uL (ref 150–400)
RBC: 4.97 MIL/uL (ref 4.22–5.81)
RDW: 19.7 % — ABNORMAL HIGH (ref 11.5–15.5)
WBC: 8.6 10*3/uL (ref 4.0–10.5)

## 2013-09-15 LAB — COMPREHENSIVE METABOLIC PANEL
ALT: 27 U/L (ref 0–53)
AST: 32 U/L (ref 0–37)
Albumin: 4 g/dL (ref 3.5–5.2)
Alkaline Phosphatase: 66 U/L (ref 39–117)
Anion gap: 21 — ABNORMAL HIGH (ref 5–15)
BUN: 15 mg/dL (ref 6–23)
CO2: 23 meq/L (ref 19–32)
CREATININE: 0.89 mg/dL (ref 0.50–1.35)
Calcium: 9.7 mg/dL (ref 8.4–10.5)
Chloride: 94 mEq/L — ABNORMAL LOW (ref 96–112)
GFR, EST NON AFRICAN AMERICAN: 90 mL/min — AB (ref >90)
Glucose, Bld: 206 mg/dL — ABNORMAL HIGH (ref 70–99)
Potassium: 4.4 mEq/L (ref 3.7–5.3)
Sodium: 138 mEq/L (ref 137–147)
TOTAL PROTEIN: 7.9 g/dL (ref 6.0–8.3)
Total Bilirubin: 0.5 mg/dL (ref 0.3–1.2)

## 2013-09-15 LAB — POC OCCULT BLOOD, ED: FECAL OCCULT BLD: NEGATIVE

## 2013-09-15 LAB — TYPE AND SCREEN
ABO/RH(D): O POS
Antibody Screen: POSITIVE
DAT, IgG: NEGATIVE
PT AG Type: NEGATIVE

## 2013-09-15 LAB — CK: CK TOTAL: 107 U/L (ref 7–232)

## 2013-09-15 LAB — LIPASE, BLOOD: LIPASE: 25 U/L (ref 11–59)

## 2013-09-15 LAB — CBG MONITORING, ED: GLUCOSE-CAPILLARY: 167 mg/dL — AB (ref 70–99)

## 2013-09-15 LAB — TROPONIN I: Troponin I: <0.3 ng/mL (ref <0.30)

## 2013-09-15 MED ORDER — IOHEXOL 300 MG/ML  SOLN
25.0000 mL | Freq: Once | INTRAMUSCULAR | Status: AC | PRN
  Administered 2013-09-15: 25 mL via ORAL

## 2013-09-15 MED ORDER — ONDANSETRON HCL 4 MG/2ML IJ SOLN
4.0000 mg | Freq: Once | INTRAMUSCULAR | Status: AC
  Administered 2013-09-15: 4 mg via INTRAVENOUS
  Filled 2013-09-15: qty 2

## 2013-09-15 MED ORDER — MORPHINE SULFATE 4 MG/ML IJ SOLN
4.0000 mg | Freq: Once | INTRAMUSCULAR | Status: AC
  Administered 2013-09-15: 4 mg via INTRAVENOUS
  Filled 2013-09-15: qty 1

## 2013-09-15 MED ORDER — SODIUM CHLORIDE 0.9 % IV SOLN
1000.0000 mL | Freq: Once | INTRAVENOUS | Status: AC
  Administered 2013-09-15: 1000 mL via INTRAVENOUS

## 2013-09-15 MED ORDER — HYDROMORPHONE HCL PF 1 MG/ML IJ SOLN
1.0000 mg | Freq: Once | INTRAMUSCULAR | Status: AC
  Administered 2013-09-15: 1 mg via INTRAVENOUS
  Filled 2013-09-15: qty 1

## 2013-09-15 MED ORDER — SODIUM CHLORIDE 0.9 % IV SOLN
1000.0000 mL | INTRAVENOUS | Status: DC
  Administered 2013-09-16: 1000 mL via INTRAVENOUS

## 2013-09-15 MED ORDER — IOHEXOL 300 MG/ML  SOLN
100.0000 mL | Freq: Once | INTRAMUSCULAR | Status: AC | PRN
  Administered 2013-09-15: 100 mL via INTRAVENOUS

## 2013-09-15 NOTE — ED Provider Notes (Signed)
CSN: [id number]     Arrival date & time 09/15/13  1557 History   First MD Initiated Contact with Patient 09/15/13 1646     Chief Complaint  Patient presents with  . Headache     (Consider location/radiation/quality/duration/timing/severity/associated sxs/prior Treatment) HPI Phillip Maldonado is a 63 y.o. male presents to the [facility] ED with the complaint of headache, generalized weakness, and abdominal pain.   He has a PMH of afib, gastric bypass, Gastric ulcers, GERD, Diabetes, and Congestive Heart Failure.  He has noticed an increase in his weakness with abdominal pain beginning Wednesday  morning. The patient states that he has been feeling extremely week and has feelings of syncope when lying, sitting or standing, but seems to be worse when standing. He has fetl fatigued and sob.   He states that it has progressively gotten worse and the last time he felt like this he had a bleeding ulcer in November 2014.  He describes the abdominal pain as constant dull ache that diffuse but more around th epigastric area.  He reports one epidsode of melana this morning followed by "grape Jelly" looking stools.  The headache is a pounding non radiating colicky pain over the top of his head. .  The only thing that helps his weakness is to lie down.    Past Medical History  Diagnosis Date  . CHF (congestive heart failure)   . Hypertension   . Anemia   . Sleep apnea, obstructive     does where bipap  . Angina   . Shortness of breath   . Anxiety   . Blood transfusion   . Headache(784.0)   . Pneumonia   . Depression   . Hepatitis Hep B  . Neuromuscular disorder DJD  . Arthritis   . Diabetes mellitus 09/18/2011  . Atrial fibrillation with RVR 01/24/2013   Past Surgical History  Procedure Laterality Date  . Back surgery    . Stomach surgery      Gastric [phone]; Reversed in 1992 and revision 1994  . Diagnostic laparoscopy    . Esophagoscopy N/A 01/01/2013    Procedure: ESOPHAGOSCOPY;  Surgeon:  Jeryl Columbia, MD;  Location: [facility];  Service: Endoscopy;  Laterality: N/A;  . Esophagogastroduodenoscopy N/A 01/03/2013    Procedure: ESOPHAGOGASTRODUODENOSCOPY (EGD);  Surgeon: Arta Silence, MD;  Location: [facility];  Service: Endoscopy;  Laterality: N/A;  . Esophagogastroduodenoscopy N/A 01/03/2013    Procedure: ESOPHAGOGASTRODUODENOSCOPY (EGD);  Surgeon: Arta Silence, MD;  Location: [facility];  Service: Endoscopy;  Laterality: N/A;  . Esophagogastroduodenoscopy N/A 01/23/2013    Procedure: ESOPHAGOGASTRODUODENOSCOPY (EGD);  Surgeon: Lear Ng, MD;  Location: [facility] ENDOSCOPY;  Service: Endoscopy;  Laterality: N/A;  . Esophagogastroduodenoscopy Left 01/27/2013    Procedure: ESOPHAGOGASTRODUODENOSCOPY (EGD);  Surgeon: Lear Ng, MD;  Location: [facility];  Service: Endoscopy;  Laterality: Left;   Family History  Problem Relation Age of Onset  . Heart disease Father   . Skin cancer Father   . COPD Father   . Hypertension Father   . Heart disease Maternal Uncle    History  Substance Use Topics  . Smoking status: Never Smoker   . Smokeless tobacco: Not on file  . Alcohol Use: No    Review of Systems  Constitutional: Positive for activity change and fatigue. Negative for fever and chills.  HENT: Negative for hearing loss.   Eyes: Negative for photophobia and visual disturbance.  Respiratory: Positive for shortness of breath. Negative for apnea, cough, wheezing and  stridor.   Cardiovascular: Positive for leg swelling (Chronic). Negative for chest pain and palpitations.  Gastrointestinal: Positive for nausea, abdominal pain and blood in stool. Negative for vomiting, diarrhea, constipation and abdominal distention.  Genitourinary: Positive for dysuria and frequency (Decrease). Negative for urgency.  Musculoskeletal: Positive for back pain (Chronic) and gait problem. Negative for arthralgias and myalgias. Joint swelling: Walks with a cane.  Skin: Negative for rash.   Neurological: Positive for light-headedness. Negative for speech difficulty, weakness, numbness and headaches.      Allergies  Adhesive; Feldene; and Latex  Home Medications   Prior to Admission medications   Medication Sig Start Date End Date Taking? Authorizing Provider  amLODipine (NORVASC) 10 MG tablet Take 1 tablet (10 mg total) by mouth daily. 01/30/13  Yes Samella Parr, NP  atorvastatin (LIPITOR) 40 MG tablet Take 40 mg by mouth daily.   Yes Historical Provider, MD  furosemide (LASIX) 40 MG tablet Take 1 tablet (40 mg total) by mouth daily. Start this dose 01/18/13 01/30/13  Yes Samella Parr, NP  gabapentin (NEURONTIN) 800 MG tablet Take 800-1,600 mg by mouth See admin instructions. 1600mg  in the morning, 800mg  in the afternoon, and 1600mg  at night.   Yes Historical Provider, MD  HYDROmorphone (DILAUDID) 2 MG tablet Take 4 mg by mouth 3 (three) times daily as needed for severe pain.   Yes Historical Provider, MD  insulin aspart (NOVOLOG) 100 UNIT/ML injection 0-15 Units, Subcutaneous, 3 times daily with meals CBG < 70: implement hypoglycemia protocol CBG 70 - 120: 0 units CBG 121 - 150: 2 units CBG 151 - 200: 3 units CBG 201 - 250: 5 units CBG 251 - 300: 8 units CBG 301 - 350: 11 units CBG 351 - 400: 15 units CBG > 400 01/05/13  Yes Shanker Kristeen Mans, MD  Insulin Glargine (LANTUS SOLOSTAR) 100 UNIT/ML SOPN Inject 10 Units into the skin daily. 01/30/13  Yes Samella Parr, NP  Insulin Pen Needle (NOVOFINE) 30G X 8 MM MISC Inject 10 each into the skin as needed. 01/30/13  Yes Samella Parr, NP  metFORMIN (GLUCOPHAGE) 500 MG tablet Take 500 mg by mouth daily with breakfast.   Yes Historical Provider, MD  methadone (DOLOPHINE) 10 MG tablet Take 1 tablet (10 mg total) by mouth 3 (three) times daily. 01/05/13  Yes Shanker Kristeen Mans, MD  metoprolol (LOPRESSOR) 50 MG tablet Take 1 tablet (50 mg total) by mouth 2 (two) times daily. 01/30/13  Yes Samella Parr, NP  Multiple  Vitamin (MULITIVITAMIN WITH MINERALS) TABS Take 1 tablet by mouth daily.   Yes Historical Provider, MD  pantoprazole (PROTONIX) 40 MG tablet Take 1 tablet (40 mg total) by mouth 2 (two) times daily. 01/30/13  Yes Samella Parr, NP  polyvinyl alcohol (LIQUIFILM TEARS) 1.4 % ophthalmic solution Place 1 drop into both eyes daily as needed. For dry eyes   Yes Historical Provider, MD  potassium chloride SA (K-DUR,KLOR-CON) 20 MEQ tablet Take 1 tablet (20 mEq total) by mouth 2 (two) times daily. 01/16/13  Yes Richarda Blade, MD  traZODone (DESYREL) 100 MG tablet Take 300 mg by mouth at bedtime.    Yes Historical Provider, MD  venlafaxine XR (EFFEXOR-XR) 150 MG 24 hr capsule Take 150 mg by mouth daily.   Yes Historical Provider, MD  nitroGLYCERIN (NITROSTAT) 0.4 MG SL tablet Place 0.4 mg under the tongue every 5 (five) minutes as needed. For chest pain    Historical Provider, MD  BP 121/78  Pulse 110  Temp(Src) 98 F (36.7 C)  Resp 20  Ht 5\' 8"  (1.727 m)  Wt 395 lb (179.171 kg)  BMI 60.07 kg/m2  SpO2 96% Physical Exam  Nursing note and vitals reviewed. Constitutional: He is oriented to person, place, and time. He appears well-developed and well-nourished. No distress.  HENT:  Head: Normocephalic and atraumatic.  Eyes: Conjunctivae are normal. No scleral icterus.  Neck: Normal range of motion. Neck supple.  Cardiovascular: Normal rate, regular rhythm, normal heart sounds and intact distal pulses.   Pulmonary/Chest: Effort normal and breath sounds normal. No respiratory distress. He has no wheezes. He has no rales. He exhibits no tenderness.  Abdominal: Soft. Bowel sounds are normal. He exhibits no distension. There is tenderness. There is no rebound and no guarding.  Musculoskeletal: Normal range of motion.  Neurological: He is alert and oriented to person, place, and time. He has normal reflexes.  Skin: Skin is warm and dry. He is not diaphoretic. Erythema: Venous stasis Bilater LE.   Psychiatric: His behavior is normal.    ED Course  Procedures (including critical care time) Labs Review Labs Reviewed  CBC WITH DIFFERENTIAL - Abnormal; Notable for the following:    Hemoglobin 11.2 (*)    HCT 37.4 (*)    MCV 75.3 (*)    MCH 22.5 (*)    MCHC 29.9 (*)    RDW 19.7 (*)    All other components within normal limits  COMPREHENSIVE METABOLIC PANEL - Abnormal; Notable for the following:    Chloride 94 (*)    Glucose, Bld 206 (*)    GFR calc non Af Amer 90 (*)    Anion gap 21 (*)    All other components within normal limits  LIPASE, BLOOD  URINALYSIS, ROUTINE W REFLEX MICROSCOPIC  OCCULT BLOOD X 1 CARD TO LAB, STOOL  CK  TROPONIN I  CBG MONITORING, ED  TYPE AND SCREEN    Imaging Review No results found.   EKG Interpretation   Date/Time:  Friday September 15 2013 16:23:43 EDT Ventricular Rate:  118 PR Interval:  158 QRS Duration: 130 QT Interval:  348 QTC Calculation: 487 R Axis:   -31 Text Interpretation:  Sinus tachycardia Left axis deviation Right bundle  branch block Abnormal ECG RBBB is new compared to 01/2013 Confirmed by  GOLDSTON  MD, [place] ([id number]) on 09/15/2013 5:41:27 PM      MDM   Final diagnoses:  Pre-syncope  EKG abnormalities    Patient with multiple complaints. He is having presyncope, hx chf and new RBBB. Ct abdomen pending. Orthostatic vs negative.  12:03 AM BP 141/71  Pulse 97  Temp(Src) 98.1 F (36.7 C) (Oral)  Resp 18  Ht 5\' 8"  (1.727 m)  Wt 395 lb (179.171 kg)  BMI 60.07 kg/m2  SpO2 98% Patient with the Pressyncope with Standing. Negative labs and imaging. New RBBB on EKG. Feel the patient will need admission for his feelings of presyncope at rest and with activity.  Review of chart shows Echo in 2014 with left EF 60-75% and normal echo. ? Dx of CHF per chart and patient.   I have discussed the case with Dr. Regenia Skeeter who feels the patient is appropiate for inpatient  Admission..  I have spoken with Dr patel who will  admit for observation.  Margarita Mail, PA-C 09/16/13 1332

## 2013-09-15 NOTE — ED Notes (Signed)
The pt is c/o a headache with nv and dizziness for 2 days.   Hx of dizziness

## 2013-09-15 NOTE — ED Notes (Signed)
PT states he has been feeling generalized weakness, dizziness upon standing, feels fatigued the past few days. PT states dark tarry stool today. Denies vomiting, but reports general abdominal discomfort. PT forgets his thoughts mid thoughts

## 2013-09-15 NOTE — ED Notes (Signed)
The pt is also c/o abd pain and his bms have been dark

## 2013-09-15 NOTE — ED Notes (Signed)
MD at bedside. [place] PA

## 2013-09-16 ENCOUNTER — Observation Stay (HOSPITAL_COMMUNITY): Payer: Medicare Other

## 2013-09-16 ENCOUNTER — Encounter (HOSPITAL_COMMUNITY): Payer: Self-pay | Admitting: General Practice

## 2013-09-16 DIAGNOSIS — E139 Other specified diabetes mellitus without complications: Secondary | ICD-10-CM

## 2013-09-16 DIAGNOSIS — R55 Syncope and collapse: Secondary | ICD-10-CM | POA: Insufficient documentation

## 2013-09-16 DIAGNOSIS — Z9884 Bariatric surgery status: Secondary | ICD-10-CM

## 2013-09-16 DIAGNOSIS — I10 Essential (primary) hypertension: Secondary | ICD-10-CM

## 2013-09-16 DIAGNOSIS — R079 Chest pain, unspecified: Secondary | ICD-10-CM

## 2013-09-16 DIAGNOSIS — G4733 Obstructive sleep apnea (adult) (pediatric): Secondary | ICD-10-CM

## 2013-09-16 DIAGNOSIS — G894 Chronic pain syndrome: Secondary | ICD-10-CM | POA: Diagnosis present

## 2013-09-16 LAB — PROTIME-INR
INR: 1.06 (ref 0.00–1.49)
PROTHROMBIN TIME: 13.8 s (ref 11.6–15.2)

## 2013-09-16 LAB — COMPREHENSIVE METABOLIC PANEL
ALT: 23 U/L (ref 0–53)
AST: 33 U/L (ref 0–37)
Albumin: 3.7 g/dL (ref 3.5–5.2)
Alkaline Phosphatase: 59 U/L (ref 39–117)
Anion gap: 18 — ABNORMAL HIGH (ref 5–15)
BILIRUBIN TOTAL: 0.5 mg/dL (ref 0.3–1.2)
BUN: 12 mg/dL (ref 6–23)
CHLORIDE: 93 meq/L — AB (ref 96–112)
CO2: 23 mEq/L (ref 19–32)
Calcium: 9.3 mg/dL (ref 8.4–10.5)
Creatinine, Ser: 0.78 mg/dL (ref 0.50–1.35)
GFR calc Af Amer: >90 mL/min (ref >90)
GFR calc non Af Amer: >90 mL/min (ref >90)
GLUCOSE: 146 mg/dL — AB (ref 70–99)
Potassium: 4.3 mEq/L (ref 3.7–5.3)
SODIUM: 134 meq/L — AB (ref 137–147)
TOTAL PROTEIN: 7.3 g/dL (ref 6.0–8.3)

## 2013-09-16 LAB — GLUCOSE, CAPILLARY
GLUCOSE-CAPILLARY: 210 mg/dL — AB (ref 70–99)
Glucose-Capillary: 171 mg/dL — ABNORMAL HIGH (ref 70–99)
Glucose-Capillary: 153 mg/dL — ABNORMAL HIGH (ref 70–99)
Glucose-Capillary: 216 mg/dL — ABNORMAL HIGH (ref 70–99)
Glucose-Capillary: 182 mg/dL — ABNORMAL HIGH (ref 70–99)

## 2013-09-16 LAB — CBC WITH DIFFERENTIAL/PLATELET
BASOS PCT: 1 % (ref 0–1)
Basophils Absolute: 0.1 10*3/uL (ref 0.0–0.1)
EOS PCT: 3 % (ref 0–5)
Eosinophils Absolute: 0.3 10*3/uL (ref 0.0–0.7)
HEMATOCRIT: 35.9 % — AB (ref 39.0–52.0)
Hemoglobin: 10.6 g/dL — ABNORMAL LOW (ref 13.0–17.0)
Lymphocytes Relative: 27 % (ref 12–46)
Lymphs Abs: 2.5 10*3/uL (ref 0.7–4.0)
MCH: 22.3 pg — AB (ref 26.0–34.0)
MCHC: 29.5 g/dL — AB (ref 30.0–36.0)
MCV: 75.6 fL — AB (ref 78.0–100.0)
MONO ABS: 0.9 10*3/uL (ref 0.1–1.0)
MONOS PCT: 10 % (ref 3–12)
NEUTROS PCT: 59 % (ref 43–77)
Neutro Abs: 5.4 10*3/uL (ref 1.7–7.7)
Platelets: 294 10*3/uL (ref 150–400)
RBC: 4.75 MIL/uL (ref 4.22–5.81)
RDW: 19.6 % — ABNORMAL HIGH (ref 11.5–15.5)
WBC: 9.1 10*3/uL (ref 4.0–10.5)

## 2013-09-16 LAB — IRON AND TIBC
Iron: 35 ug/dL — ABNORMAL LOW (ref 42–135)
SATURATION RATIOS: 9 % — AB (ref 20–55)
TIBC: 397 ug/dL (ref 215–435)
UIBC: 362 ug/dL (ref 125–400)

## 2013-09-16 LAB — TROPONIN I
Troponin I: <0.3 ng/mL (ref <0.30)
Troponin I: <0.3 ng/mL (ref <0.30)

## 2013-09-16 LAB — FOLATE: Folate: >20 ng/mL

## 2013-09-16 LAB — TSH: TSH: 4 u[IU]/mL (ref 0.350–4.500)

## 2013-09-16 LAB — APTT: aPTT: 26 seconds (ref 24–37)

## 2013-09-16 LAB — FERRITIN: FERRITIN: 11 ng/mL — AB (ref 22–322)

## 2013-09-16 LAB — VITAMIN B12: VITAMIN B 12: 965 pg/mL — AB (ref 211–911)

## 2013-09-16 MED ORDER — PANTOPRAZOLE SODIUM 40 MG PO TBEC
40.0000 mg | DELAYED_RELEASE_TABLET | Freq: Two times a day (BID) | ORAL | Status: DC
  Administered 2013-09-16 – 2013-09-17 (×4): 40 mg via ORAL
  Filled 2013-09-16 (×5): qty 1

## 2013-09-16 MED ORDER — GABAPENTIN 400 MG PO CAPS
1600.0000 mg | ORAL_CAPSULE | ORAL | Status: DC
  Administered 2013-09-16 – 2013-09-17 (×3): 1600 mg via ORAL
  Filled 2013-09-16 (×5): qty 4

## 2013-09-16 MED ORDER — FUROSEMIDE 40 MG PO TABS
40.0000 mg | ORAL_TABLET | Freq: Every day | ORAL | Status: DC
  Filled 2013-09-16: qty 1

## 2013-09-16 MED ORDER — ALPRAZOLAM 0.25 MG PO TABS: 0.2500 mg | ORAL_TABLET | Freq: Three times a day (TID) | ORAL | Status: DC | PRN

## 2013-09-16 MED ORDER — METOPROLOL TARTRATE 50 MG PO TABS
50.0000 mg | ORAL_TABLET | Freq: Two times a day (BID) | ORAL | Status: DC
  Administered 2013-09-16 – 2013-09-17 (×4): 50 mg via ORAL
  Filled 2013-09-16 (×5): qty 1

## 2013-09-16 MED ORDER — NITROGLYCERIN 0.4 MG SL SUBL
0.4000 mg | SUBLINGUAL_TABLET | SUBLINGUAL | Status: DC | PRN
Start: 2013-09-16 — End: 2013-09-17

## 2013-09-16 MED ORDER — INSULIN GLARGINE 100 UNIT/ML ~~LOC~~ SOLN
10.0000 [IU] | Freq: Every day | SUBCUTANEOUS | Status: DC
  Administered 2013-09-16 – 2013-09-17 (×2): 10 [IU] via SUBCUTANEOUS
  Filled 2013-09-16 (×2): qty 0.1

## 2013-09-16 MED ORDER — INSULIN ASPART 100 UNIT/ML ~~LOC~~ SOLN
0.0000 [IU] | Freq: Three times a day (TID) | SUBCUTANEOUS | Status: DC
  Administered 2013-09-16: 3 [IU] via SUBCUTANEOUS
  Administered 2013-09-16: 5 [IU] via SUBCUTANEOUS
  Administered 2013-09-16: 3 [IU] via SUBCUTANEOUS
  Administered 2013-09-17: 5 [IU] via SUBCUTANEOUS
  Administered 2013-09-17: 3 [IU] via SUBCUTANEOUS

## 2013-09-16 MED ORDER — HYDROMORPHONE HCL PF 1 MG/ML IJ SOLN: 0.5000 mg | INTRAMUSCULAR | Status: DC | PRN

## 2013-09-16 MED ORDER — ONDANSETRON HCL 4 MG PO TABS: 4.0000 mg | ORAL_TABLET | Freq: Four times a day (QID) | ORAL | Status: DC | PRN

## 2013-09-16 MED ORDER — VENLAFAXINE HCL ER 150 MG PO CP24
150.0000 mg | ORAL_CAPSULE | Freq: Every day | ORAL | Status: DC
  Administered 2013-09-16 – 2013-09-17 (×2): 150 mg via ORAL
  Filled 2013-09-16 (×2): qty 1

## 2013-09-16 MED ORDER — INSULIN ASPART 100 UNIT/ML ~~LOC~~ SOLN
0.0000 [IU] | Freq: Every day | SUBCUTANEOUS | Status: DC
  Administered 2013-09-16: 2 [IU] via SUBCUTANEOUS

## 2013-09-16 MED ORDER — TRAZODONE HCL 150 MG PO TABS
300.0000 mg | ORAL_TABLET | Freq: Every day | ORAL | Status: DC
  Administered 2013-09-16 (×2): 300 mg via ORAL
  Filled 2013-09-16 (×3): qty 2

## 2013-09-16 MED ORDER — ATORVASTATIN CALCIUM 40 MG PO TABS
40.0000 mg | ORAL_TABLET | Freq: Every day | ORAL | Status: DC
  Administered 2013-09-16 – 2013-09-17 (×2): 40 mg via ORAL
  Filled 2013-09-16 (×2): qty 1

## 2013-09-16 MED ORDER — POTASSIUM CHLORIDE CRYS ER 20 MEQ PO TBCR
20.0000 meq | EXTENDED_RELEASE_TABLET | Freq: Two times a day (BID) | ORAL | Status: DC
  Administered 2013-09-16 – 2013-09-17 (×4): 20 meq via ORAL
  Filled 2013-09-16 (×5): qty 1

## 2013-09-16 MED ORDER — DULOXETINE HCL 60 MG PO CPEP
60.0000 mg | ORAL_CAPSULE | Freq: Every day | ORAL | Status: DC
  Administered 2013-09-16 – 2013-09-17 (×2): 60 mg via ORAL
  Filled 2013-09-16 (×2): qty 1

## 2013-09-16 MED ORDER — ADULT MULTIVITAMIN W/MINERALS CH
1.0000 | ORAL_TABLET | Freq: Every day | ORAL | Status: DC
  Administered 2013-09-16 – 2013-09-17 (×2): 1 via ORAL
  Filled 2013-09-16 (×2): qty 1

## 2013-09-16 MED ORDER — ALUM & MAG HYDROXIDE-SIMETH 200-200-20 MG/5ML PO SUSP
30.0000 mL | Freq: Four times a day (QID) | ORAL | Status: DC | PRN
Start: 2013-09-16 — End: 2013-09-17

## 2013-09-16 MED ORDER — GABAPENTIN 400 MG PO CAPS
800.0000 mg | ORAL_CAPSULE | ORAL | Status: DC
  Administered 2013-09-16 – 2013-09-17 (×2): 800 mg via ORAL
  Filled 2013-09-16 (×2): qty 2

## 2013-09-16 MED ORDER — ONDANSETRON HCL 4 MG/2ML IJ SOLN: 4.0000 mg | Freq: Four times a day (QID) | INTRAMUSCULAR | Status: DC | PRN

## 2013-09-16 MED ORDER — METHADONE HCL 10 MG PO TABS
10.0000 mg | ORAL_TABLET | Freq: Three times a day (TID) | ORAL | Status: DC
  Administered 2013-09-16 – 2013-09-17 (×5): 10 mg via ORAL
  Filled 2013-09-16 (×5): qty 1

## 2013-09-16 MED ORDER — AMLODIPINE BESYLATE 10 MG PO TABS
10.0000 mg | ORAL_TABLET | Freq: Every day | ORAL | Status: DC
  Administered 2013-09-16 – 2013-09-17 (×2): 10 mg via ORAL
  Filled 2013-09-16 (×2): qty 1

## 2013-09-16 MED ORDER — SODIUM CHLORIDE 0.9 % IJ SOLN
3.0000 mL | Freq: Two times a day (BID) | INTRAMUSCULAR | Status: DC
Start: 2013-09-16 — End: 2013-09-17
  Administered 2013-09-16 (×2): 3 mL via INTRAVENOUS

## 2013-09-16 NOTE — Consult Note (Signed)
Referring Physician: Adrian Prows, MD/ Estill Cotta, MD/PCP-Abigail Leretha Pol, MD  Phillip Maldonado is an 63 y.o. male.                       Chief Complaint: Chest pain HPI: 63 year old male with hypertension, diabetes and morbid obesity has 1 week history of recurrent chest pain. Had stress test 2 years ago and was told he was OK. Echocardiogram 7 months ago showed good LV systolic function with mild aortic stenosis. Cardiac enzymes negative so far.  Past Medical History  Diagnosis Date  . CHF (congestive heart failure)   . Hypertension   . Anemia   . Sleep apnea, obstructive     does where bipap  . Angina   . Shortness of breath   . Anxiety   . Blood transfusion   . Headache(784.0)   . Pneumonia   . Depression   . Hepatitis Hep B  . Neuromuscular disorder DJD  . Arthritis   . Diabetes mellitus 09/18/2011  . Atrial fibrillation with RVR 01/24/2013  . Renal insufficiency       Past Surgical History  Procedure Laterality Date  . Back surgery    . Stomach surgery      Gastric [phone]; Reversed in 1992 and revision 1994  . Diagnostic laparoscopy    . Esophagoscopy N/A 01/01/2013    Procedure: ESOPHAGOSCOPY;  Surgeon: Jeryl Columbia, MD;  Location: [place];  Service: Endoscopy;  Laterality: N/A;  . Esophagogastroduodenoscopy N/A 01/03/2013    Procedure: ESOPHAGOGASTRODUODENOSCOPY (EGD);  Surgeon: Arta Silence, MD;  Location: [facility];  Service: Endoscopy;  Laterality: N/A;  . Esophagogastroduodenoscopy N/A 01/03/2013    Procedure: ESOPHAGOGASTRODUODENOSCOPY (EGD);  Surgeon: Arta Silence, MD;  Location: [facility];  Service: Endoscopy;  Laterality: N/A;  . Esophagogastroduodenoscopy N/A 01/23/2013    Procedure: ESOPHAGOGASTRODUODENOSCOPY (EGD);  Surgeon: Lear Ng, MD;  Location: [facility];  Service: Endoscopy;  Laterality: N/A;  . Esophagogastroduodenoscopy Left 01/27/2013    Procedure: ESOPHAGOGASTRODUODENOSCOPY (EGD);  Surgeon: Lear Ng, MD;   Location: [place];  Service: Endoscopy;  Laterality: Left;    Family History  Problem Relation Age of Onset  . Heart disease Father   . Skin cancer Father   . COPD Father   . Hypertension Father   . Heart disease Maternal Uncle    Social History:  reports that he has never smoked. He does not have any smokeless tobacco history on file. He reports that he does not drink alcohol or use illicit drugs.  Allergies:  Allergies  Allergen Reactions  . Adhesive [Tape] Other (See Comments)    unknown  . Feldene [Piroxicam] Other (See Comments)    unknown  . Latex     Rash and itching    Medications Prior to Admission  Medication Sig Dispense Refill  . amLODipine (NORVASC) 10 MG tablet Take 1 tablet (10 mg total) by mouth daily.  30 tablet  6  . atorvastatin (LIPITOR) 40 MG tablet Take 40 mg by mouth daily.      . DULoxetine (CYMBALTA) 60 MG capsule Take 60 mg by mouth daily.      . furosemide (LASIX) 40 MG tablet Take 1 tablet (40 mg total) by mouth daily. Start this dose 01/18/13  60 tablet  0  . gabapentin (NEURONTIN) 800 MG tablet Take 800-1,600 mg by mouth See admin instructions. 1669m in the morning, 8037min the afternoon, and 160078mt night.      .Marland Kitchen  insulin aspart (NOVOLOG) 100 UNIT/ML injection 0-15 Units, Subcutaneous, 3 times daily with meals CBG < 70: implement hypoglycemia protocol CBG 70 - 120: 0 units CBG 121 - 150: 2 units CBG 151 - 200: 3 units CBG 201 - 250: 5 units CBG 251 - 300: 8 units CBG 301 - 350: 11 units CBG 351 - 400: 15 units CBG > 400  1 vial  12  . Insulin Glargine (LANTUS SOLOSTAR) 100 UNIT/ML SOPN Inject 10 Units into the skin daily.  1 pen  1  . Insulin Pen Needle (NOVOFINE) 30G X 8 MM MISC Inject 10 each into the skin as needed.  100 each  0  . metFORMIN (GLUCOPHAGE) 500 MG tablet Take 500 mg by mouth daily with breakfast.      . methadone (DOLOPHINE) 10 MG tablet Take 1 tablet (10 mg total) by mouth 3 (three) times daily.  30 tablet  0  . metoprolol  (LOPRESSOR) 50 MG tablet Take 1 tablet (50 mg total) by mouth 2 (two) times daily.  60 tablet  6  . Multiple Vitamin (MULITIVITAMIN WITH MINERALS) TABS Take 1 tablet by mouth daily.      . pantoprazole (PROTONIX) 40 MG tablet Take 1 tablet (40 mg total) by mouth 2 (two) times daily.  60 tablet  2  . polyvinyl alcohol (LIQUIFILM TEARS) 1.4 % ophthalmic solution Place 1 drop into both eyes daily as needed. For dry eyes      . potassium chloride SA (K-DUR,KLOR-CON) 20 MEQ tablet Take 1 tablet (20 mEq total) by mouth 2 (two) times daily.  3 tablet  6  . traZODone (DESYREL) 100 MG tablet Take 300 mg by mouth at bedtime.       . nitroGLYCERIN (NITROSTAT) 0.4 MG SL tablet Place 0.4 mg under the tongue every 5 (five) minutes as needed. For chest pain      . venlafaxine XR (EFFEXOR-XR) 150 MG 24 hr capsule Take 150 mg by mouth daily.        Results for orders placed during the hospital encounter of 09/15/13 (from the past 48 hour(s))  CBC WITH DIFFERENTIAL     Status: Abnormal   Collection Time    09/15/13  4:18 PM      Result Value Ref Range   WBC 8.6  4.0 - 10.5 K/uL   RBC 4.97  4.22 - 5.81 MIL/uL   Hemoglobin 11.2 (*) 13.0 - 17.0 g/dL   HCT 37.4 (*) 39.0 - 52.0 %   MCV 75.3 (*) 78.0 - 100.0 fL   MCH 22.5 (*) 26.0 - 34.0 pg   MCHC 29.9 (*) 30.0 - 36.0 g/dL   RDW 19.7 (*) 11.5 - 15.5 %   Platelets 328  150 - 400 K/uL   Neutrophils Relative % 66  43 - 77 %   Neutro Abs 5.8  1.7 - 7.7 K/uL   Lymphocytes Relative 22  12 - 46 %   Lymphs Abs 1.9  0.7 - 4.0 K/uL   Monocytes Relative 8  3 - 12 %   Monocytes Absolute 0.7  0.1 - 1.0 K/uL   Eosinophils Relative 3  0 - 5 %   Eosinophils Absolute 0.2  0.0 - 0.7 K/uL   Basophils Relative 1  0 - 1 %   Basophils Absolute 0.1  0.0 - 0.1 K/uL  COMPREHENSIVE METABOLIC PANEL     Status: Abnormal   Collection Time    09/15/13  4:18 PM      Result  Value Ref Range   Sodium 138  137 - 147 mEq/L   Potassium 4.4  3.7 - 5.3 mEq/L   Chloride 94 (*) 96 - 112  mEq/L   CO2 23  19 - 32 mEq/L   Glucose, Bld 206 (*) 70 - 99 mg/dL   BUN 15  6 - 23 mg/dL   Creatinine, Ser 0.89  0.50 - 1.35 mg/dL   Calcium 9.7  8.4 - 10.5 mg/dL   Total Protein 7.9  6.0 - 8.3 g/dL   Albumin 4.0  3.5 - 5.2 g/dL   AST 32  0 - 37 U/L   ALT 27  0 - 53 U/L   Alkaline Phosphatase 66  39 - 117 U/L   Total Bilirubin 0.5  0.3 - 1.2 mg/dL   GFR calc non Af Amer 90 (*) >90 mL/min   GFR calc Af Amer >90  >90 mL/min   Comment: (NOTE)     The eGFR has been calculated using the CKD EPI equation.     This calculation has not been validated in all clinical situations.     eGFR's persistently <90 mL/min signify possible Chronic Kidney     Disease.   Anion gap 21 (*) 5 - 15  LIPASE, BLOOD     Status: None   Collection Time    09/15/13  4:18 PM      Result Value Ref Range   Lipase 25  11 - 59 U/L  CK     Status: None   Collection Time    09/15/13  4:18 PM      Result Value Ref Range   Total CK 107  7 - 232 U/L  TROPONIN I     Status: None   Collection Time    09/15/13  4:18 PM      Result Value Ref Range   Troponin I <0.30  <0.30 ng/mL   Comment:            Due to the release kinetics of cTnI,     a negative result within the first hours     of the onset of symptoms does not rule out     myocardial infarction with certainty.     If myocardial infarction is still suspected,     repeat the test at appropriate intervals.  TYPE AND SCREEN     Status: None   Collection Time    09/15/13  5:15 PM      Result Value Ref Range   ABO/RH(D) O POS     Antibody Screen POS     Sample Expiration 09/18/2013     Antibody Identification ANTI E     DAT, IgG NEG     PT AG Type NEGATIVE FOR E ANTIGEN    POC OCCULT BLOOD, ED     Status: None   Collection Time    09/15/13  5:54 PM      Result Value Ref Range   Fecal Occult Bld NEGATIVE  NEGATIVE  CBG MONITORING, ED     Status: Abnormal   Collection Time    09/15/13  6:41 PM      Result Value Ref Range   Glucose-Capillary 167 (*)  70 - 99 mg/dL  URINALYSIS, ROUTINE W REFLEX MICROSCOPIC     Status: Abnormal   Collection Time    09/15/13  6:55 PM      Result Value Ref Range   Color, Urine YELLOW  YELLOW   APPearance CLOUDY (*)  CLEAR   Specific Gravity, Urine 1.020  1.005 - 1.030   pH 7.0  5.0 - 8.0   Glucose, UA NEGATIVE  NEGATIVE mg/dL   Hgb urine dipstick NEGATIVE  NEGATIVE   Bilirubin Urine NEGATIVE  NEGATIVE   Ketones, ur 15 (*) NEGATIVE mg/dL   Protein, ur NEGATIVE  NEGATIVE mg/dL   Urobilinogen, UA 1.0  0.0 - 1.0 mg/dL   Nitrite NEGATIVE  NEGATIVE   Leukocytes, UA NEGATIVE  NEGATIVE   Comment: MICROSCOPIC NOT DONE ON URINES WITH NEGATIVE PROTEIN, BLOOD, LEUKOCYTES, NITRITE, OR GLUCOSE <1000 mg/dL.  GLUCOSE, CAPILLARY     Status: Abnormal   Collection Time    09/16/13  1:53 AM      Result Value Ref Range   Glucose-Capillary 171 (*) 70 - 99 mg/dL   Comment 1 Notify RN    CBC WITH DIFFERENTIAL     Status: Abnormal   Collection Time    09/16/13  2:43 AM      Result Value Ref Range   WBC 9.1  4.0 - 10.5 K/uL   RBC 4.75  4.22 - 5.81 MIL/uL   Hemoglobin 10.6 (*) 13.0 - 17.0 g/dL   HCT 35.9 (*) 39.0 - 52.0 %   MCV 75.6 (*) 78.0 - 100.0 fL   MCH 22.3 (*) 26.0 - 34.0 pg   MCHC 29.5 (*) 30.0 - 36.0 g/dL   RDW 19.6 (*) 11.5 - 15.5 %   Platelets 294  150 - 400 K/uL   Neutrophils Relative % 59  43 - 77 %   Neutro Abs 5.4  1.7 - 7.7 K/uL   Lymphocytes Relative 27  12 - 46 %   Lymphs Abs 2.5  0.7 - 4.0 K/uL   Monocytes Relative 10  3 - 12 %   Monocytes Absolute 0.9  0.1 - 1.0 K/uL   Eosinophils Relative 3  0 - 5 %   Eosinophils Absolute 0.3  0.0 - 0.7 K/uL   Basophils Relative 1  0 - 1 %   Basophils Absolute 0.1  0.0 - 0.1 K/uL  APTT     Status: None   Collection Time    09/16/13  2:43 AM      Result Value Ref Range   aPTT 26  24 - 37 seconds  PROTIME-INR     Status: None   Collection Time    09/16/13  2:43 AM      Result Value Ref Range   Prothrombin Time 13.8  11.6 - 15.2 seconds   INR 1.06  0.00  - 1.49  COMPREHENSIVE METABOLIC PANEL     Status: Abnormal   Collection Time    09/16/13  2:43 AM      Result Value Ref Range   Sodium 134 (*) 137 - 147 mEq/L   Potassium 4.3  3.7 - 5.3 mEq/L   Chloride 93 (*) 96 - 112 mEq/L   CO2 23  19 - 32 mEq/L   Glucose, Bld 146 (*) 70 - 99 mg/dL   BUN 12  6 - 23 mg/dL   Creatinine, Ser 0.78  0.50 - 1.35 mg/dL   Calcium 9.3  8.4 - 10.5 mg/dL   Total Protein 7.3  6.0 - 8.3 g/dL   Albumin 3.7  3.5 - 5.2 g/dL   AST 33  0 - 37 U/L   ALT 23  0 - 53 U/L   Alkaline Phosphatase 59  39 - 117 U/L   Total Bilirubin 0.5  0.3 -  1.2 mg/dL   GFR calc non Af Amer >90  >90 mL/min   GFR calc Af Amer >90  >90 mL/min   Comment: (NOTE)     The eGFR has been calculated using the CKD EPI equation.     This calculation has not been validated in all clinical situations.     eGFR's persistently <90 mL/min signify possible Chronic Kidney     Disease.   Anion gap 18 (*) 5 - 15  TSH     Status: None   Collection Time    09/16/13  7:30 AM      Result Value Ref Range   TSH 4.000  0.350 - 4.500 uIU/mL  TROPONIN I     Status: None   Collection Time    09/16/13  7:30 AM      Result Value Ref Range   Troponin I <0.30  <0.30 ng/mL   Comment:            Due to the release kinetics of cTnI,     a negative result within the first hours     of the onset of symptoms does not rule out     myocardial infarction with certainty.     If myocardial infarction is still suspected,     repeat the test at appropriate intervals.  GLUCOSE, CAPILLARY     Status: Abnormal   Collection Time    09/16/13  7:54 AM      Result Value Ref Range   Glucose-Capillary 153 (*) 70 - 99 mg/dL   Ct Head Wo Contrast  09/16/2013   CLINICAL DATA:  Generalized weakness  EXAM: CT HEAD WITHOUT CONTRAST  TECHNIQUE: Contiguous axial images were obtained from the base of the skull through the vertex without intravenous contrast.  COMPARISON:  None.  FINDINGS: There is no evidence of mass effect, midline  shift or extra-axial fluid collections. There is no evidence of a space-occupying lesion or intracranial hemorrhage. There is no evidence of a cortical-based area of acute infarction.  The ventricles and sulci are appropriate for the patient's age. The basal cisterns are patent.  Visualized portions of the orbits are unremarkable. The visualized portions of the paranasal sinuses and mastoid air cells are unremarkable.  The osseous structures are unremarkable.  IMPRESSION: No acute intracranial pathology.   Electronically Signed   By: Kathreen Devoid   On: 09/16/2013 08:38   Ct Abdomen Pelvis W Contrast  09/15/2013   CLINICAL DATA:  Left lower quadrant pain  EXAM: CT ABDOMEN AND PELVIS WITH CONTRAST  TECHNIQUE: Multidetector CT imaging of the abdomen and pelvis was performed using the standard protocol following bolus administration of intravenous contrast.  CONTRAST:  145m OMNIPAQUE IOHEXOL 300 MG/ML  SOLN  COMPARISON:  None.  FINDINGS: BODY WALL: Gynecomastia; asymmetry is likely related to partial imaging on the right.  LOWER CHEST: Coronary atherosclerosis.  ABDOMEN/PELVIS:  Liver: Diffuse fatty infiltration of the liver. Lobulated circumscribed subcapsular mass in segment 2-3 is most consistent with cyst.  Biliary: Gallbladder distention with dependent high-density material.  Pancreas: Unremarkable.  Spleen: Unremarkable.  Adrenals: Unremarkable.  Kidneys and ureters: 4 mm stone in the lower pole right kidney, nonobstructive. No hydronephrosis on either side.  Bladder: Unremarkable.  Reproductive: Mild prostate enlargement, deforming the bladder base. Cystic change in the left aspect of the gland is likely degenerative cyst.  Bowel: There are surgical changes of gastric bypass. Per the electronic medical record, there was reversal, which explains contrast within the gastric  body and antrum. This patient with recent history of marginal ulcer - there is no inflammatory changes around the gastrojejunostomy. There  is no evidence of perforation. No bowel obstruction. Distal colonic diverticulosis. No pericecal inflammation.  Retroperitoneum: No mass or adenopathy.  Peritoneum: No ascites or pneumoperitoneum.  Vascular: No acute abnormality.  OSSEOUS: Severe and diffuse degenerative disc disease. There is posterior rod and pedicle screw fixation from L1-L4, with complete posterior bony fusion. Ankylosis continues cranially into the lower and mid thoracic spine. Laminectomy at L5-S1.  IMPRESSION: 1. No acute intra-abdominal findings. 2. Cholelithiasis or gallbladder sludge. 3. Roux-en-Y gastric bypass status post reversal. 4. Colonic diverticulosis. 5. Right nephrolithiasis.   Electronically Signed   By: Jorje Guild M.D.   On: 09/15/2013 22:28    Review Of System   Blood pressure 123/71, pulse 83, temperature 98.5 F (36.9 C), temperature source Oral, resp. rate 22, height _0  (1.778 m), weight 177.447 kg (391 lb 3.2 oz), SpO2 97.00%. Physical Exam General: Alert, Awake and Oriented to Time, Place and Person. No distress  Eyes: PERRL, Conj-pale pink, Sclera-white.  ENT: Oral Mucosa clear moist.  Neck: No JVD  Cardiovascular: S1 and S2 Present, III/VI systolic murmur, Peripheral Pulses Present  Respiratory: Bilateral Air entry equal and Decreased, Clear to Auscultation,  no Crackles, no wheezes  Abdomen: Bowel Sound Present, Soft and minimally tender  Skin: No Rash  Extremities: Bilateral Pedal edema, no calf tenderness  Neurologic: Grossly no focal neuro deficit other than right-sided less sensation  Assessment/Plan Chest pain r/o MI Morbid obesity Hypertension Obstructive sleep apnea Depression Hepatitis DM, II  Nuclear stress test in AM.  Keya Wynes S 09/16/2013, 11:05 AM

## 2013-09-16 NOTE — Progress Notes (Signed)
Pt c/o of rt sided cp under his breast. Pt believes the pain is anxiety related. MD on floor and aware. EKG ordered & showed NSR. Will continue to monitor the pt. Hoover Brunette, RN

## 2013-09-16 NOTE — Progress Notes (Signed)
Patient ID: Phillip Maldonado  male  [id number]    DOB: June 06, 1950    DOA: 09/15/2013  PCP: Liliane Bade  Assessment/Plan: Principal Problem:   Near syncope with chest pain - Patient reports episodic dizziness for the last 1 week with chest pain, currently no chest pain  -Troponins x2 negative so far -  2-D echo in 11/14 showed EF of 65-70%, no regional wall motion abnormalities, mild aortic stenosis - Called cardiology consult (Dr Doylene Canard), patient's primary cardiologist, Dr. Einar Gip, may need further workup including stress test versus repeating echocardiogram, will defer to cardiology  - Patient also reported vague complaints of decreased sensation on the right side, in the palms and feet intermittently, CT head was done which was negative for any acute intracranial pathology - Patient also has a number of vague complaints, appears to have some anxiety component.  Active Problems:    HTN (hypertension) - Currently stable    Diabetes mellitus - Obtain hemoglobin A1c, continue sliding scale insulin  History of gastric bypass and prior upper GI bleed - Patient had mentioned black-colored bowel movements x2, stool occult test was however negative. - Will recheck stool occult test, advance diet - CT abdomen and pelvis was done which showed no acute intra-abdominal findings, cholelithiasis or gallbladder sludge    Chronic pain syndrome - Continue methadone, Neurontin, Cymbalta  History of anxiety disorder - Continue Cymbalta, Effexor, trazodone  DVT Prophylaxis: SCDs  Code Status:  Family Communication:  Disposition: Hopefully tomorrow  Consultants:  Cardiology, Dr. Doylene Canard  Procedures:  None  Antibiotics:  None    Subjective: Patient seen and examined, multiple complaints, no dizziness or chest pain at the time of my encounter  Objective: Weight change:   Intake/Output Summary (Last 24 hours) at 09/16/13 1007 Last data filed at 09/16/13 0347  Gross  per 24 hour  Intake    810 ml  Output      0 ml  Net    810 ml   Blood pressure 123/71, pulse 83, temperature 98.5 F (36.9 C), temperature source Oral, resp. rate 22, height 5\' 10"  (1.778 m), weight 177.447 kg (391 lb 3.2 oz), SpO2 97.00%.  Physical Exam: General: Alert and awake, oriented x3, not in any acute distress. CVS: S1-S2 clear, no murmur rubs or gallops Chest: clear to auscultation bilaterally, no wheezing, rales or rhonchi Abdomen: Morbidly obese, soft nontender, nondistended, normal bowel sounds  Extremities: no cyanosis, clubbing bilaterally Neuro: Cranial nerves II-XII intact, no focal neurological deficits  Lab Results: Basic Metabolic Panel:  Recent Labs Lab 09/15/13 1618 09/16/13 0243  NA 138 134*  K 4.4 4.3  CL 94* 93*  CO2 23 23  GLUCOSE 206* 146*  BUN 15 12  CREATININE 0.89 0.78  CALCIUM 9.7 9.3   Liver Function Tests:  Recent Labs Lab 09/15/13 1618 09/16/13 0243  AST 32 33  ALT 27 23  ALKPHOS 66 59  BILITOT 0.5 0.5  PROT 7.9 7.3  ALBUMIN 4.0 3.7    Recent Labs Lab 09/15/13 1618  LIPASE 25   No results found for this basename: AMMONIA,  in the last 168 hours CBC:  Recent Labs Lab 09/15/13 1618 09/16/13 0243  WBC 8.6 9.1  NEUTROABS 5.8 5.4  HGB 11.2* 10.6*  HCT 37.4* 35.9*  MCV 75.3* 75.6*  PLT 328 294   Cardiac Enzymes:  Recent Labs Lab 09/15/13 1618 09/16/13 0730  CKTOTAL 107  --   TROPONINI <0.30 <0.30   BNP: No components found with this  basename: POCBNP,  CBG:  Recent Labs Lab 09/15/13 1841 09/16/13 0153 09/16/13 0754  GLUCAP 167* 171* 153*     Micro Results: No results found for this or any previous visit (from the past 240 hour(s)).  Studies/Results: Ct Head Wo Contrast  09/16/2013   CLINICAL DATA:  Generalized weakness  EXAM: CT HEAD WITHOUT CONTRAST  TECHNIQUE: Contiguous axial images were obtained from the base of the skull through the vertex without intravenous contrast.  COMPARISON:  None.   FINDINGS: There is no evidence of mass effect, midline shift or extra-axial fluid collections. There is no evidence of a space-occupying lesion or intracranial hemorrhage. There is no evidence of a cortical-based area of acute infarction.  The ventricles and sulci are appropriate for the patient's age. The basal cisterns are patent.  Visualized portions of the orbits are unremarkable. The visualized portions of the paranasal sinuses and mastoid air cells are unremarkable.  The osseous structures are unremarkable.  IMPRESSION: No acute intracranial pathology.   Electronically Signed   By: Kathreen Devoid   On: 09/16/2013 08:38   Ct Abdomen Pelvis W Contrast  09/15/2013   CLINICAL DATA:  Left lower quadrant pain  EXAM: CT ABDOMEN AND PELVIS WITH CONTRAST  TECHNIQUE: Multidetector CT imaging of the abdomen and pelvis was performed using the standard protocol following bolus administration of intravenous contrast.  CONTRAST:  170mL OMNIPAQUE IOHEXOL 300 MG/ML  SOLN  COMPARISON:  None.  FINDINGS: BODY WALL: Gynecomastia; asymmetry is likely related to partial imaging on the right.  LOWER CHEST: Coronary atherosclerosis.  ABDOMEN/PELVIS:  Liver: Diffuse fatty infiltration of the liver. Lobulated circumscribed subcapsular mass in segment 2-3 is most consistent with cyst.  Biliary: Gallbladder distention with dependent high-density material.  Pancreas: Unremarkable.  Spleen: Unremarkable.  Adrenals: Unremarkable.  Kidneys and ureters: 4 mm stone in the lower pole right kidney, nonobstructive. No hydronephrosis on either side.  Bladder: Unremarkable.  Reproductive: Mild prostate enlargement, deforming the bladder base. Cystic change in the left aspect of the gland is likely degenerative cyst.  Bowel: There are surgical changes of gastric bypass. Per the electronic medical record, there was reversal, which explains contrast within the gastric body and antrum. This patient with recent history of marginal ulcer - there is no  inflammatory changes around the gastrojejunostomy. There is no evidence of perforation. No bowel obstruction. Distal colonic diverticulosis. No pericecal inflammation.  Retroperitoneum: No mass or adenopathy.  Peritoneum: No ascites or pneumoperitoneum.  Vascular: No acute abnormality.  OSSEOUS: Severe and diffuse degenerative disc disease. There is posterior rod and pedicle screw fixation from L1-L4, with complete posterior bony fusion. Ankylosis continues cranially into the lower and mid thoracic spine. Laminectomy at L5-S1.  IMPRESSION: 1. No acute intra-abdominal findings. 2. Cholelithiasis or gallbladder sludge. 3. Roux-en-Y gastric bypass status post reversal. 4. Colonic diverticulosis. 5. Right nephrolithiasis.   Electronically Signed   By: Jorje Guild M.D.   On: 09/15/2013 22:28    Medications: Scheduled Meds: . amLODipine  10 mg Oral Daily  . atorvastatin  40 mg Oral Daily  . DULoxetine  60 mg Oral Daily  . gabapentin  1,600 mg Oral 2 times per day  . gabapentin  800 mg Oral Q24H  . insulin aspart  0-15 Units Subcutaneous TID WC  . insulin aspart  0-5 Units Subcutaneous QHS  . methadone  10 mg Oral TID  . metoprolol  50 mg Oral BID  . multivitamin with minerals  1 tablet Oral Daily  . pantoprazole  40 mg Oral BID  . potassium chloride SA  20 mEq Oral BID  . sodium chloride  3 mL Intravenous Q12H  . traZODone  300 mg Oral QHS  . venlafaxine XR  150 mg Oral Daily      LOS: 1 day   Daana Petrasek M.D. [facility] 09/16/2013, 10:07 AM Pager: [phone]  If 7PM-7AM, please contact night-coverage [email or web address] Password [email or web address]  **Disclaimer: This note was dictated with voice recognition software. Similar sounding words can inadvertently be transcribed and this note may contain transcription errors which may not have been corrected upon publication of note.**

## 2013-09-16 NOTE — H&P (Signed)
[facility] History and Physical  Patient: Phillip Maldonado  VOJ:[id number]  DOB: [birth year]  DOS: the patient was seen and examined on 09/16/2013 PCP: PROVIDER NOT IN SYSTEM  Chief Complaint: Dizziness and diarrhea  HPI: Phillip Maldonado is a 63 y.o. male with Past medical history of hypertension, sleep apnea, gastric bypass surgery, GI bleed morbid obesity, A. fib. Patient presented with complaints of dizziness or lightheadedness. He mentions that he has been having gradually worsening dizziness which he describes as a lightheadedness without any vertigo. Ongoing since last one week progressively worsening in the last 2 days associated with severe tiredness and lethargy. Today while he was in his restroom he started having black color bowel movement x2 following which she had some shortness of breath. While he was in the hospital he also started complaining of some substernal chest pain which resolved on its own. He denies any focal neurological deficit but complains of some generalized weakness. He denies any recent change in his medication. He generally has constipation and that is the reason why he mentions he is not taking his iron supplements.  The patient is coming from home. And at his baseline independent for most of his ADL.  Review of Systems: as mentioned in the history of present illness.  A Comprehensive review of the other systems is negative.  Past Medical History  Diagnosis Date  . CHF (congestive heart failure)   . Hypertension   . Anemia   . Sleep apnea, obstructive     does where bipap  . Angina   . Shortness of breath   . Anxiety   . Blood transfusion   . Headache(784.0)   . Pneumonia   . Depression   . Hepatitis Hep B  . Neuromuscular disorder DJD  . Arthritis   . Diabetes mellitus 09/18/2011  . Atrial fibrillation with RVR 01/24/2013  . Renal insufficiency    Past Surgical History  Procedure Laterality Date  . Back surgery    . Stomach  surgery      Gastric [phone]; Reversed in 1992 and revision 1994  . Diagnostic laparoscopy    . Esophagoscopy N/A 01/01/2013    Procedure: ESOPHAGOSCOPY;  Surgeon: Jeryl Columbia, MD;  Location: [place];  Service: Endoscopy;  Laterality: N/A;  . Esophagogastroduodenoscopy N/A 01/03/2013    Procedure: ESOPHAGOGASTRODUODENOSCOPY (EGD);  Surgeon: Arta Silence, MD;  Location: [facility];  Service: Endoscopy;  Laterality: N/A;  . Esophagogastroduodenoscopy N/A 01/03/2013    Procedure: ESOPHAGOGASTRODUODENOSCOPY (EGD);  Surgeon: Arta Silence, MD;  Location: [facility];  Service: Endoscopy;  Laterality: N/A;  . Esophagogastroduodenoscopy N/A 01/23/2013    Procedure: ESOPHAGOGASTRODUODENOSCOPY (EGD);  Surgeon: Lear Ng, MD;  Location: [facility];  Service: Endoscopy;  Laterality: N/A;  . Esophagogastroduodenoscopy Left 01/27/2013    Procedure: ESOPHAGOGASTRODUODENOSCOPY (EGD);  Surgeon: Lear Ng, MD;  Location: [place];  Service: Endoscopy;  Laterality: Left;   Social History:  reports that he has never smoked. He does not have any smokeless tobacco history on file. He reports that he does not drink alcohol or use illicit drugs.  Allergies  Allergen Reactions  . Adhesive [Tape] Other (See Comments)    unknown  . Feldene [Piroxicam] Other (See Comments)    unknown  . Latex     Rash and itching    Family History  Problem Relation Age of Onset  . Heart disease Father   . Skin cancer Father   . COPD Father   . Hypertension Father   .  Heart disease Maternal Uncle     Prior to Admission medications   Medication Sig Start Date End Date Taking? Authorizing Provider  amLODipine (NORVASC) 10 MG tablet Take 1 tablet (10 mg total) by mouth daily. 01/30/13  Yes Samella Parr, NP  atorvastatin (LIPITOR) 40 MG tablet Take 40 mg by mouth daily.   Yes Historical Provider, MD  DULoxetine (CYMBALTA) 60 MG capsule Take 60 mg by mouth daily.   Yes Historical Provider, MD   furosemide (LASIX) 40 MG tablet Take 1 tablet (40 mg total) by mouth daily. Start this dose 01/18/13 01/30/13  Yes Samella Parr, NP  gabapentin (NEURONTIN) 800 MG tablet Take 800-1,600 mg by mouth See admin instructions. 1600mg  in the morning, 800mg  in the afternoon, and 1600mg  at night.   Yes Historical Provider, MD  insulin aspart (NOVOLOG) 100 UNIT/ML injection 0-15 Units, Subcutaneous, 3 times daily with meals CBG < 70: implement hypoglycemia protocol CBG 70 - 120: 0 units CBG 121 - 150: 2 units CBG 151 - 200: 3 units CBG 201 - 250: 5 units CBG 251 - 300: 8 units CBG 301 - 350: 11 units CBG 351 - 400: 15 units CBG > 400 01/05/13  Yes Shanker Kristeen Mans, MD  Insulin Glargine (LANTUS SOLOSTAR) 100 UNIT/ML SOPN Inject 10 Units into the skin daily. 01/30/13  Yes Samella Parr, NP  Insulin Pen Needle (NOVOFINE) 30G X 8 MM MISC Inject 10 each into the skin as needed. 01/30/13  Yes Samella Parr, NP  metFORMIN (GLUCOPHAGE) 500 MG tablet Take 500 mg by mouth daily with breakfast.   Yes Historical Provider, MD  methadone (DOLOPHINE) 10 MG tablet Take 1 tablet (10 mg total) by mouth 3 (three) times daily. 01/05/13  Yes Shanker Kristeen Mans, MD  metoprolol (LOPRESSOR) 50 MG tablet Take 1 tablet (50 mg total) by mouth 2 (two) times daily. 01/30/13  Yes Samella Parr, NP  Multiple Vitamin (MULITIVITAMIN WITH MINERALS) TABS Take 1 tablet by mouth daily.   Yes Historical Provider, MD  pantoprazole (PROTONIX) 40 MG tablet Take 1 tablet (40 mg total) by mouth 2 (two) times daily. 01/30/13  Yes Samella Parr, NP  polyvinyl alcohol (LIQUIFILM TEARS) 1.4 % ophthalmic solution Place 1 drop into both eyes daily as needed. For dry eyes   Yes Historical Provider, MD  potassium chloride SA (K-DUR,KLOR-CON) 20 MEQ tablet Take 1 tablet (20 mEq total) by mouth 2 (two) times daily. 01/16/13  Yes Richarda Blade, MD  traZODone (DESYREL) 100 MG tablet Take 300 mg by mouth at bedtime.    Yes Historical Provider, MD   nitroGLYCERIN (NITROSTAT) 0.4 MG SL tablet Place 0.4 mg under the tongue every 5 (five) minutes as needed. For chest pain    Historical Provider, MD  venlafaxine XR (EFFEXOR-XR) 150 MG 24 hr capsule Take 150 mg by mouth daily.    Historical Provider, MD    Physical Exam: Filed Vitals:   09/16/13 0030 09/16/13 0100 09/16/13 0202 09/16/13 0400  BP: 122/71 128/71 157/75 134/73  Pulse: 95 94 77 94  Temp:   98.6 F (37 C) 98.1 F (36.7 C)  TempSrc:   Oral Oral  Resp:  23 20 20   Height:   5\' 10"  (1.778 m)   Weight:   177.447 kg (391 lb 3.2 oz)   SpO2: 96% 93% 92% 96%    General: Alert, Awake and Oriented to Time, Place and Person. Appear in mild distress Eyes: PERRL ENT: Oral Mucosa clear  moist. Neck:  No JVD Cardiovascular: S1 and S2 Present,  no Murmur, Peripheral Pulses Present Respiratory: Bilateral Air entry equal and Decreased, Clear to Auscultation,  no Crackles, no wheezes Abdomen: Bowel Sound Present, Soft and minimally  tender Skin:  No Rash Extremities:  Bilateral Pedal edema,  no calf tenderness Neurologic: Grossly no focal neuro deficit other than right-sided less sensation  Labs on Admission:  CBC:  Recent Labs Lab 09/15/13 1618 09/16/13 0243  WBC 8.6 9.1  NEUTROABS 5.8 5.4  HGB 11.2* 10.6*  HCT 37.4* 35.9*  MCV 75.3* 75.6*  PLT 328 294    CMP     Component Value Date/Time   NA 134* 09/16/2013 0243   K 4.3 09/16/2013 0243   CL 93* 09/16/2013 0243   CO2 23 09/16/2013 0243   GLUCOSE 146* 09/16/2013 0243   BUN 12 09/16/2013 0243   CREATININE 0.78 09/16/2013 0243   CALCIUM 9.3 09/16/2013 0243   PROT 7.3 09/16/2013 0243   ALBUMIN 3.7 09/16/2013 0243   AST 33 09/16/2013 0243   ALT 23 09/16/2013 0243   ALKPHOS 59 09/16/2013 0243   BILITOT 0.5 09/16/2013 0243   GFRNONAA >90 09/16/2013 0243   GFRAA >90 09/16/2013 0243     Recent Labs Lab 09/15/13 1618  LIPASE 25   No results found for this basename: AMMONIA,  in the last 168 hours   Recent Labs Lab 09/15/13 1618   CKTOTAL 107  TROPONINI <0.30   BNP (last 3 results)  Recent Labs  01/16/13 1604  PROBNP 147.6*    Radiological Exams on Admission: Ct Abdomen Pelvis W Contrast  09/15/2013   CLINICAL DATA:  Left lower quadrant pain  EXAM: CT ABDOMEN AND PELVIS WITH CONTRAST  TECHNIQUE: Multidetector CT imaging of the abdomen and pelvis was performed using the standard protocol following bolus administration of intravenous contrast.  CONTRAST:  148mL OMNIPAQUE IOHEXOL 300 MG/ML  SOLN  COMPARISON:  None.  FINDINGS: BODY WALL: Gynecomastia; asymmetry is likely related to partial imaging on the right.  LOWER CHEST: Coronary atherosclerosis.  ABDOMEN/PELVIS:  Liver: Diffuse fatty infiltration of the liver. Lobulated circumscribed subcapsular mass in segment 2-3 is most consistent with cyst.  Biliary: Gallbladder distention with dependent high-density material.  Pancreas: Unremarkable.  Spleen: Unremarkable.  Adrenals: Unremarkable.  Kidneys and ureters: 4 mm stone in the lower pole right kidney, nonobstructive. No hydronephrosis on either side.  Bladder: Unremarkable.  Reproductive: Mild prostate enlargement, deforming the bladder base. Cystic change in the left aspect of the gland is likely degenerative cyst.  Bowel: There are surgical changes of gastric bypass. Per the electronic medical record, there was reversal, which explains contrast within the gastric body and antrum. This patient with recent history of marginal ulcer - there is no inflammatory changes around the gastrojejunostomy. There is no evidence of perforation. No bowel obstruction. Distal colonic diverticulosis. No pericecal inflammation.  Retroperitoneum: No mass or adenopathy.  Peritoneum: No ascites or pneumoperitoneum.  Vascular: No acute abnormality.  OSSEOUS: Severe and diffuse degenerative disc disease. There is posterior rod and pedicle screw fixation from L1-L4, with complete posterior bony fusion. Ankylosis continues cranially into the lower and  mid thoracic spine. Laminectomy at L5-S1.  IMPRESSION: 1. No acute intra-abdominal findings. 2. Cholelithiasis or gallbladder sludge. 3. Roux-en-Y gastric bypass status post reversal. 4. Colonic diverticulosis. 5. Right nephrolithiasis.   Electronically Signed   By: Jorje Guild M.D.   On: 09/15/2013 22:28   EKG: Independently reviewed. normal EKG, normal sinus rhythm.  Assessment/Plan  Principal Problem:   Near syncope Active Problems:   Chest pain   HTN (hypertension)   Diabetes mellitus   OSA (obstructive sleep apnea)   UGIB (upper gastrointestinal bleed)   History of gastric bypass   Chronic pain syndrome   1. Near syncope patient presents with multiple complaints His biggest concern is dizziness and lightheadedness that has been progressively worsening. He also has some right-sided loss of sensation. Patient will be evaluated with CT scan of the head were further workup at her We will continue to monitor serial neuro checks. PTOT consultation. Patient is not orthostatic in the ED. I would continue his home medications.  2. Black color bowel movement. Patient has history of gastric bypass and has upper GI bleeding in the past. At present he is complaining of black bowel movement although his hemoglobin is stable and hemoglobin is negative. At present I would continue to monitor him continue him on Protonix. Keep him on clear liquid diet. Monitor on telemetry.  3. Chest pain. At present etiology less likely cardiac in origin EKG normal troponin pending. We'll continue to monitor on telemetry follow serum troponin. Echogram in the morning.  4. Chronic pain Continue methadone  DVT Prophylaxis: mechanical compression device Nutrition:  clear liquid diet   Code Status: Full   Disposition: Admitted to observation in telemetry unit.  Author: Berle Mull, MD [facility] Pager: [phone] 09/16/2013, 6:04 AM    If 7PM-7AM, please contact  night-coverage [email or web address] Password [email or web address]  **Disclaimer: This note may have been dictated with voice recognition software. Similar sounding words can inadvertently be transcribed and this note may contain transcription errors which may not have been corrected upon publication of note.**

## 2013-09-16 NOTE — Progress Notes (Signed)
Orthopedic Tech Progress Note Patient Details:  LAJUAN KOVALESKI 09-02-1950 [id number]  Patient ID: [name], male   DOB: Apr 09, 1950, 63 y.o.   MRN: [id number] Ortho has no unna boot dressing ( zinc oxide AND calamine lotion ) in stock; product cannot be located in materials ; rn notified  Sharlet Salina, Asiel Chrostowski 09/16/2013, 1:20 PM

## 2013-09-16 NOTE — Evaluation (Signed)
Physical Therapy Evaluation Patient Details Name: Phillip Maldonado MRN: [id number] DOB: November 15, 1950 Today's Date: 09/16/2013   History of Present Illness  Patient is a 63 yo male admitted 09/15/13 with near syncope, lightheadedness, diarrhea.  Patient with h/o HTN, OSA, DM, CHF, GIB, A-fib, morbid obesity, mult back surgeries, bil. shoulder injuries.  Clinical Impression  Patient presents with problems listed below.  Will benefit from acute PT to maximize independence prior to returning home alone.  Recommend f/u HHPT for continued therapy for mobility/balance.    Follow Up Recommendations Home health PT;Supervision - Intermittent    Equipment Recommendations  None recommended by PT    Recommendations for Other Services       Precautions / Restrictions Precautions Precautions: None Restrictions Weight Bearing Restrictions: No      Mobility  Bed Mobility Overal bed mobility: Modified Independent             General bed mobility comments: Increased time and use of bed rail.  Transfers Overall transfer level: Needs assistance Equipment used: Straight cane Transfers: Sit to/from Stand Sit to Stand: Supervision         General transfer comment: Supervision for safety only from bed and toilet.  Balance fairly good in static standing.  Ambulation/Gait Ambulation/Gait assistance: Supervision Ambulation Distance (Feet): 82 Feet Assistive device: Straight cane Gait Pattern/deviations: Step-through pattern;Decreased stride length;Trunk flexed;Wide base of support Gait velocity: Decreased Gait velocity interpretation: Below normal speed for age/gender General Gait Details: Patient demonstrates safe use of cane.  Supervision for safety.  Fatigues quickly - required standing rest break during gait.  Stairs            Wheelchair Mobility    Modified Rankin (Stroke Patients Only)       Balance                                              Pertinent Vitals/Pain Chronic pain in back.    Home Living Family/patient expects to be discharged to:: Private residence Living Arrangements: Alone Available Help at Discharge: Family;Available PRN/intermittently Type of Home: Apartment Home Access: Level entry     Home Layout: One level Home Equipment: Cane - single point;Walker - 4 wheels      Prior Function Level of Independence: Independent with assistive device(s);Needs assistance   Gait / Transfers Assistance Needed: Uses cane or rollator for ambulation.  ADL's / Homemaking Assistance Needed: Has assist with housekeeping.        Hand Dominance   Dominant Hand: Right    Extremity/Trunk Assessment   Upper Extremity Assessment: RUE deficits/detail;LUE deficits/detail RUE Deficits / Details: Decreased shoulder ROM and strength (3-/5 with 40* flexion/abduction).  Decreased hand/grip strength and decreased sensation.   RUE Sensation: decreased light touch;decreased proprioception (Worse than Lt hand) LUE Deficits / Details: decreased shoulder strength 3/5, and ROM with 90* flexion.  Decreased sensation.   Lower Extremity Assessment: Generalized weakness (Strength grossly 4/5)      Cervical / Trunk Assessment: Other exceptions  Communication   Communication: No difficulties  Cognition Arousal/Alertness: Awake/alert Behavior During Therapy: WFL for tasks assessed/performed Overall Cognitive Status: Within Functional Limits for tasks assessed                      General Comments      Exercises        Assessment/Plan  PT Assessment Patient needs continued PT services  PT Diagnosis Abnormality of gait;Generalized weakness;Acute pain   PT Problem List Decreased strength;Decreased range of motion;Decreased activity tolerance;Decreased balance;Decreased mobility;Cardiopulmonary status limiting activity;Obesity;Pain  PT Treatment Interventions DME instruction;Gait training;Functional mobility  training;Patient/family education   PT Goals (Current goals can be found in the Care Plan section) Acute Rehab PT Goals Patient Stated Goal: To get back home PT Goal Formulation: With patient Time For Goal Achievement: 09/23/13 Potential to Achieve Goals: Good    Frequency Min 3X/week   Barriers to discharge Decreased caregiver support Patient lives alone    Co-evaluation               End of Session Equipment Utilized During Treatment: Gait belt Activity Tolerance: Patient limited by fatigue;Patient limited by pain Patient left: in bed;with call bell/phone within reach;with nursing/sitter in room (sitting EOB) Nurse Communication: Mobility status (Encouraged ambulation with nursing supervision)    Functional Assessment Tool Used: clinical judgement Functional Limitation: Mobility: Walking and moving around Mobility: Walking and Moving Around Current Status ([id number]): At least 1 percent but less than 20 percent impaired, limited or restricted Mobility: Walking and Moving Around Goal Status [phone]): 0 percent impaired, limited or restricted    Time: 0842-0932 PT Time Calculation (min): 50 min   Charges:   PT Evaluation $Initial PT Evaluation Tier I: 1 Procedure PT Treatments $Gait Training: 23-37 mins   PT G Codes:   Functional Assessment Tool Used: clinical judgement Functional Limitation: Mobility: Walking and moving around    Despina Pole 09/16/2013, 9:53 AM Carita Pian. Sanjuana Kava, [place] Pager [phone]

## 2013-09-17 ENCOUNTER — Observation Stay (HOSPITAL_COMMUNITY): Payer: Medicare Other

## 2013-09-17 DIAGNOSIS — I4891 Unspecified atrial fibrillation: Secondary | ICD-10-CM

## 2013-09-17 LAB — CBC
HEMATOCRIT: 34.3 % — AB (ref 39.0–52.0)
HEMOGLOBIN: 9.8 g/dL — AB (ref 13.0–17.0)
MCH: 21.7 pg — ABNORMAL LOW (ref 26.0–34.0)
MCHC: 28.6 g/dL — ABNORMAL LOW (ref 30.0–36.0)
MCV: 76.1 fL — ABNORMAL LOW (ref 78.0–100.0)
Platelets: 260 10*3/uL (ref 150–400)
RBC: 4.51 MIL/uL (ref 4.22–5.81)
RDW: 19.7 % — ABNORMAL HIGH (ref 11.5–15.5)
WBC: 8.2 10*3/uL (ref 4.0–10.5)

## 2013-09-17 LAB — BASIC METABOLIC PANEL
Anion gap: 14 (ref 5–15)
BUN: 11 mg/dL (ref 6–23)
CHLORIDE: 97 meq/L (ref 96–112)
CO2: 24 mEq/L (ref 19–32)
Calcium: 9.1 mg/dL (ref 8.4–10.5)
Creatinine, Ser: 0.81 mg/dL (ref 0.50–1.35)
GFR calc Af Amer: >90 mL/min (ref >90)
GFR calc non Af Amer: >90 mL/min (ref >90)
GLUCOSE: 175 mg/dL — AB (ref 70–99)
Potassium: 4.7 mEq/L (ref 3.7–5.3)
Sodium: 135 mEq/L — ABNORMAL LOW (ref 137–147)

## 2013-09-17 LAB — HEMOGLOBIN A1C
HEMOGLOBIN A1C: 9.1 % — AB (ref <5.7)
MEAN PLASMA GLUCOSE: 214 mg/dL — AB (ref <117)

## 2013-09-17 LAB — GLUCOSE, CAPILLARY
GLUCOSE-CAPILLARY: 169 mg/dL — AB (ref 70–99)
Glucose-Capillary: 231 mg/dL — ABNORMAL HIGH (ref 70–99)

## 2013-09-17 MED ORDER — ALPRAZOLAM 0.25 MG PO TABS: 0.2500 mg | ORAL_TABLET | Freq: Every day | ORAL | Status: DC | PRN

## 2013-09-17 MED ORDER — REGADENOSON 0.4 MG/5ML IV SOLN
0.4000 mg | Freq: Once | INTRAVENOUS | Status: AC
  Administered 2013-09-17: 0.4 mg via INTRAVENOUS
  Filled 2013-09-17: qty 5

## 2013-09-17 MED ORDER — REGADENOSON 0.4 MG/5ML IV SOLN
INTRAVENOUS | Status: AC
  Filled 2013-09-17: qty 5

## 2013-09-17 MED ORDER — TECHNETIUM TC 99M SESTAMIBI - CARDIOLITE: 30.0000 | Freq: Once | INTRAVENOUS | Status: AC | PRN

## 2013-09-17 MED ORDER — TECHNETIUM TC 99M SESTAMIBI - CARDIOLITE
30.0000 | Freq: Once | INTRAVENOUS | Status: AC | PRN
  Administered 2013-09-16 – 2013-09-17 (×2): 30 via INTRAVENOUS

## 2013-09-17 NOTE — Discharge Summary (Signed)
Physician Discharge Summary  Patient ID: Phillip Maldonado MRN: [id number] DOB/AGE: 63-Jun-1952 63 y.o.  Admit date: 09/15/2013 Discharge date: 09/17/2013  Primary Care Physician:  Liliane Bade  Discharge Diagnoses:    . Near syncope . Chest pain . OSA (obstructive sleep apnea) . UGIB (upper gastrointestinal bleed) . HTN (hypertension) . Diabetes mellitus . Chronic pain syndrome  Consults: Cardiology, Dr. Doylene Canard   Allergies:   Allergies  Allergen Reactions  . Adhesive [Tape] Other (See Comments)    unknown  . Feldene [Piroxicam] Other (See Comments)    unknown  . Latex     Rash and itching     Discharge Medications:   Medication List         ALPRAZolam 0.25 MG tablet  Commonly known as:  XANAX  Take 1 tablet (0.25 mg total) by mouth daily as needed for anxiety.     amLODipine 10 MG tablet  Commonly known as:  NORVASC  Take 1 tablet (10 mg total) by mouth daily.     atorvastatin 40 MG tablet  Commonly known as:  LIPITOR  Take 40 mg by mouth daily.     DULoxetine 60 MG capsule  Commonly known as:  CYMBALTA  Take 60 mg by mouth daily.     furosemide 40 MG tablet  Commonly known as:  LASIX  Take 1 tablet (40 mg total) by mouth daily. Start this dose 01/18/13     gabapentin 800 MG tablet  Commonly known as:  NEURONTIN  Take 800-1,600 mg by mouth See admin instructions. 1600mg  in the morning, 800mg  in the afternoon, and 1600mg  at night.     insulin aspart 100 UNIT/ML injection  Commonly known as:  novoLOG  - 0-15 Units, Subcutaneous, 3 times daily with meals  - CBG < 70: implement hypoglycemia protocol  - CBG 70 - 120: 0 units  - CBG 121 - 150: 2 units  - CBG 151 - 200: 3 units  - CBG 201 - 250: 5 units  - CBG 251 - 300: 8 units  - CBG 301 - 350: 11 units  - CBG 351 - 400: 15 units  - CBG > 400     Insulin Glargine 100 UNIT/ML Solostar Pen  Commonly known as:  LANTUS SOLOSTAR  Inject 10 Units into the skin daily.     Insulin Pen  Needle 30G X 8 MM Misc  Commonly known as:  NOVOFINE  Inject 10 each into the skin as needed.     metFORMIN 500 MG tablet  Commonly known as:  GLUCOPHAGE  Take 500 mg by mouth daily with breakfast.     methadone 10 MG tablet  Commonly known as:  DOLOPHINE  Take 1 tablet (10 mg total) by mouth 3 (three) times daily.     metoprolol 50 MG tablet  Commonly known as:  LOPRESSOR  Take 1 tablet (50 mg total) by mouth 2 (two) times daily.     multivitamin with minerals Tabs tablet  Take 1 tablet by mouth daily.     nitroGLYCERIN 0.4 MG SL tablet  Commonly known as:  NITROSTAT  Place 0.4 mg under the tongue every 5 (five) minutes as needed. For chest pain     pantoprazole 40 MG tablet  Commonly known as:  PROTONIX  Take 1 tablet (40 mg total) by mouth 2 (two) times daily.     polyvinyl alcohol 1.4 % ophthalmic solution  Commonly known as:  LIQUIFILM TEARS  Place 1 drop into both eyes  daily as needed. For dry eyes     potassium chloride SA 20 MEQ tablet  Commonly known as:  K-DUR,KLOR-CON  Take 1 tablet (20 mEq total) by mouth 2 (two) times daily.     traZODone 100 MG tablet  Commonly known as:  DESYREL  Take 300 mg by mouth at bedtime.     venlafaxine XR 150 MG 24 hr capsule  Commonly known as:  EFFEXOR-XR  Take 150 mg by mouth daily.         Brief H and P: For complete details please refer to admission H and P, but in brief Phillip Maldonado is a 63 y.o. male with Past medical history of hypertension, sleep apnea, gastric bypass surgery, GI bleed morbid obesity, A. fib.  Patient presented with complaints of dizziness or lightheadedness. He mentions that he has been having gradually worsening dizziness which he describes as a lightheadedness without any vertigo. Ongoing since last one week progressively worsening in the last 2 days associated with severe tiredness and lethargy. On the day of admission, while he was in his restroom he started having black color bowel movement  x2 following which she had some shortness of breath. While he was in the hospital he also started complaining of some substernal chest pain which resolved on its own.  He denied any focal neurological deficit but complains of some generalized weakness.  He denied any recent change in his medication.  He generally has constipation and that is the reason why he mentions he is not taking his iron supplements   Hospital Course:  Near syncope with chest pain  - Patient reported episodic dizziness for the last 1 week with chest pain, currently no chest pain. He was ruled out for acute ACS with negative troponins. 2-D echo in 11/14 had shown EF of 65-70%, no regional wall motion abnormalities, mild aortic stenosis   Cardiology was consulted, patient was seen by Dr. Doylene Canard, recommended nuclear medicine stress Myoview. Stress test showed EF 68% and no reversible ischemia, normal wall motion.   Patient also reported vague complaints of decreased sensation on the right side, in the palms and feet intermittently, CT head was done which was negative for any acute intracranial pathology  Patient also has a number of vague complaints, appears to have some anxiety component, placed on low dose of Xanax as needed, patient to followup with his PCP.  -He's ambulating without any difficulty or any chest pain, dizziness, PT OT evaluation was obtained and recommended no follow. Unna boots were placed for peripheral edema.    HTN (hypertension) - Currently stable   Diabetes mellitus - patient was placed on sliding scale insulin   History of gastric bypass and prior upper GI bleed  - Patient had mentioned black-colored bowel movements x2, stool occult test was however negative.  Patient has been tolerating diet without any difficulty. CT abdomen and pelvis was done which showed no acute intra-abdominal findings, cholelithiasis or gallbladder sludge   Chronic pain syndrome  - Continue methadone, Neurontin,  Cymbalta   History of anxiety disorder  - Continue Cymbalta, Effexor, trazodone    Day of Discharge BP 121/57  Pulse 84  Temp(Src) 97.8 F (36.6 C) (Oral)  Resp 18  Ht 5\' 10"  (1.778 m)  Wt 179.488 kg (395 lb 11.2 oz)  BMI 56.78 kg/m2  SpO2 100%  Physical Exam:  General: Alert and awake, oriented x3, not in any acute distress.  CVS: S1-S2 clear, no murmur rubs or gallops  Chest: CTAB  Abdomen: Morbidly obese, soft NT, NBS  Extremities: no cyanosis, clubbing bilaterally  Neuro: Cranial nerves II-XII intact, no focal neurological deficits     The results of significant diagnostics from this hospitalization (including imaging, microbiology, ancillary and laboratory) are listed below for reference.    LAB RESULTS: Basic Metabolic Panel:  Recent Labs Lab 09/16/13 0243 09/17/13 0415  NA 134* 135*  K 4.3 4.7  CL 93* 97  CO2 23 24  GLUCOSE 146* 175*  BUN 12 11  CREATININE 0.78 0.81  CALCIUM 9.3 9.1   Liver Function Tests:  Recent Labs Lab 09/15/13 1618 09/16/13 0243  AST 32 33  ALT 27 23  ALKPHOS 66 59  BILITOT 0.5 0.5  PROT 7.9 7.3  ALBUMIN 4.0 3.7    Recent Labs Lab 09/15/13 1618  LIPASE 25   No results found for this basename: AMMONIA,  in the last 168 hours CBC:  Recent Labs Lab 09/16/13 0243 09/17/13 0415  WBC 9.1 8.2  NEUTROABS 5.4  --   HGB 10.6* 9.8*  HCT 35.9* 34.3*  MCV 75.6* 76.1*  PLT 294 260   Cardiac Enzymes:  Recent Labs Lab 09/15/13 1618  09/16/13 1320 09/16/13 2000  CKTOTAL 107  --   --   --   TROPONINI <0.30  < > <0.30 <0.30  < > = values in this interval not displayed. BNP: No components found with this basename: POCBNP,  CBG:  Recent Labs Lab 09/17/13 0741 09/17/13 1150  GLUCAP 169* 231*    Significant Diagnostic Studies:  Ct Head Wo Contrast  09/16/2013   CLINICAL DATA:  Generalized weakness  EXAM: CT HEAD WITHOUT CONTRAST  TECHNIQUE: Contiguous axial images were obtained from the base of the skull  through the vertex without intravenous contrast.  COMPARISON:  None.  FINDINGS: There is no evidence of mass effect, midline shift or extra-axial fluid collections. There is no evidence of a space-occupying lesion or intracranial hemorrhage. There is no evidence of a cortical-based area of acute infarction.  The ventricles and sulci are appropriate for the patient's age. The basal cisterns are patent.  Visualized portions of the orbits are unremarkable. The visualized portions of the paranasal sinuses and mastoid air cells are unremarkable.  The osseous structures are unremarkable.  IMPRESSION: No acute intracranial pathology.   Electronically Signed   By: Kathreen Devoid   On: 09/16/2013 08:38   Ct Abdomen Pelvis W Contrast  09/15/2013   CLINICAL DATA:  Left lower quadrant pain  EXAM: CT ABDOMEN AND PELVIS WITH CONTRAST  TECHNIQUE: Multidetector CT imaging of the abdomen and pelvis was performed using the standard protocol following bolus administration of intravenous contrast.  CONTRAST:  156mL OMNIPAQUE IOHEXOL 300 MG/ML  SOLN  COMPARISON:  None.  FINDINGS: BODY WALL: Gynecomastia; asymmetry is likely related to partial imaging on the right.  LOWER CHEST: Coronary atherosclerosis.  ABDOMEN/PELVIS:  Liver: Diffuse fatty infiltration of the liver. Lobulated circumscribed subcapsular mass in segment 2-3 is most consistent with cyst.  Biliary: Gallbladder distention with dependent high-density material.  Pancreas: Unremarkable.  Spleen: Unremarkable.  Adrenals: Unremarkable.  Kidneys and ureters: 4 mm stone in the lower pole right kidney, nonobstructive. No hydronephrosis on either side.  Bladder: Unremarkable.  Reproductive: Mild prostate enlargement, deforming the bladder base. Cystic change in the left aspect of the gland is likely degenerative cyst.  Bowel: There are surgical changes of gastric bypass. Per the electronic medical record, there was reversal, which explains contrast within the gastric  body and  antrum. This patient with recent history of marginal ulcer - there is no inflammatory changes around the gastrojejunostomy. There is no evidence of perforation. No bowel obstruction. Distal colonic diverticulosis. No pericecal inflammation.  Retroperitoneum: No mass or adenopathy.  Peritoneum: No ascites or pneumoperitoneum.  Vascular: No acute abnormality.  OSSEOUS: Severe and diffuse degenerative disc disease. There is posterior rod and pedicle screw fixation from L1-L4, with complete posterior bony fusion. Ankylosis continues cranially into the lower and mid thoracic spine. Laminectomy at L5-S1.  IMPRESSION: 1. No acute intra-abdominal findings. 2. Cholelithiasis or gallbladder sludge. 3. Roux-en-Y gastric bypass status post reversal. 4. Colonic diverticulosis. 5. Right nephrolithiasis.   Electronically Signed   By: Jorje Guild M.D.   On: 09/15/2013 22:28      Disposition and Follow-up: Discharge Instructions   Diet - low sodium heart healthy    Complete by:  As directed      Increase activity slowly    Complete by:  As directed             DISPOSITION home     DISCHARGE FOLLOW-UP Follow-up Information   Follow up with DEVRIES, ABIGAIL. Schedule an appointment as soon as possible for a visit in 2 weeks. (for hospital follow-up)    Specialty:  Family Medicine   Contact information:   [facility] [address] [place] [address] [phone]       Follow up with Laverda Page, MD. Schedule an appointment as soon as possible for a visit in 2 weeks. (for hospital follow-up)    Specialty:  Cardiology   Contact information:   [address] [place] [address] [phone]       Time spent on Discharge: 40 mins  Signed:   RAI,RIPUDEEP M.D. [facility] 09/17/2013, 2:39 PM Pager: [phone]   **Disclaimer: This note was dictated with voice recognition software. Similar sounding words can inadvertently be  transcribed and this note may contain transcription errors which may not have been corrected upon publication of note.**

## 2013-09-17 NOTE — Progress Notes (Signed)
Occupational Therapy Evaluation Patient Details Name: Phillip Maldonado MRN: [id number] DOB: Jun 12, 1950 Today's Date: 09/17/2013    History of Present Illness Patient is a 63 yo male admitted 09/15/13 with near syncope, lightheadedness, diarrhea.  Patient with h/o HTN, OSA, DM, CHF, GIB, A-fib, morbid obesity, mult back surgeries, bil. shoulder injuries.   Clinical Impression   PTA pt lived at home alone and was mod Independent for ADLs and functional mobility with use of SPC and AE (reacher and sock aid). Pt reports increasing difficulty with IADLs due to impaired sensation in Bil UEs. Education and training completed for fall prevention, safety with IADLs, and skin checks due to impaired sensation. No further acute OT needs at this time.     Follow Up Recommendations  No OT follow up    Equipment Recommendations  None recommended by OT       Precautions / Restrictions Precautions Precautions: None Restrictions Weight Bearing Restrictions: No      Mobility Bed Mobility Overal bed mobility: Modified Independent                Transfers Overall transfer level: Modified independent Equipment used: Straight cane             General transfer comment: Pt Mod Independent and likely at baseline. Able to stand from low chair and ambulate to bed and to bathroom with no apparent deficits.     Balance Overall balance assessment: No apparent balance deficits (not formally assessed)                                          ADL Overall ADL's : Modified independent;At baseline                                       General ADL Comments: Pt reports that he is able to use reacher and sock aid effectively for ADLs. Pt is at baseline with ADLs and functional mobility. Educated pt on pausing after positional changes for increased safety. Encouraged pt to continue daily routine and safe ambulation around home to increase muscle strength.       Vision  Pt reports no change from baseline.  No apparent visual deficits.                 Perception Perception Perception Tested?: No   Praxis Praxis Praxis tested?: Within functional limits    Pertinent Vitals/Pain NAD; pt moving around room, dressed, preparing for stress test     Hand Dominance Right   Extremity/Trunk Assessment Upper Extremity Assessment Upper Extremity Assessment: RUE deficits/detail;LUE deficits/detail;Generalized weakness RUE Deficits / Details: Decreased shoulder ROM and strength (3-/5 with 40* flexion/abduction).  Decreased hand/grip strength and decreased sensation. RUE Sensation: decreased light touch;decreased proprioception (worse than Lt hand) LUE Deficits / Details: decreased shoulder strength 3/5, and ROM with 90* flexion.  Decreased sensation. LUE Sensation: decreased light touch   Lower Extremity Assessment Lower Extremity Assessment: Generalized weakness       Communication Communication Communication: No difficulties   Cognition Arousal/Alertness: Awake/alert Behavior During Therapy: WFL for tasks assessed/performed Overall Cognitive Status: Within Functional Limits for tasks assessed  Home Living Family/patient expects to be discharged to:: Private residence Living Arrangements: Alone Available Help at Discharge: Family;Available PRN/intermittently Type of Home: Apartment Home Access: Level entry     Home Layout: One level     Bathroom Shower/Tub: Tub/shower unit Shower/tub characteristics: Curtain Biochemist, clinical: Handicapped height     Home Equipment: Cane - single point;Walker - 4 wheels;Adaptive equipment Adaptive Equipment: Reacher;Sock aid Additional Comments: Pt reports that his 2 minor children (14 and 12) stay with him every other weekend in the summer.       Prior Functioning/Environment Level of Independence: Independent with assistive device(s);Needs  assistance  Gait / Transfers Assistance Needed: Uses cane or rollator for ambulation. ADL's / Homemaking Assistance Needed: Pt reports that a woman comes every other week to clean the apartment. Pt reports increasing difficulty with IADLs (especially cooking) and reports that it is especially difficult when his children stay with him.                                   End of Session Equipment Utilized During Treatment: Other (comment) [facility])  Activity Tolerance: Patient tolerated treatment well Patient left: Other (comment) (sitting EOB, with transport arrived to take pt to stress test)   Time: 8676-7209 OT Time Calculation (min): 10 min Charges:  OT General Charges $OT Visit: 1 Procedure OT Evaluation $Initial OT Evaluation Tier I: 1 Procedure G-Codes: OT G-codes **NOT FOR INPATIENT CLASS** Functional Assessment Tool Used: clinical judgment Functional Limitation: Self care Self Care Current Status (O7096): At least 1 percent but less than 20 percent impaired, limited or restricted Self Care Goal Status (G8366): At least 1 percent but less than 20 percent impaired, limited or restricted Self Care Discharge Status [phone]): At least 1 percent but less than 20 percent impaired, limited or restricted   Juluis Rainier [phone] 09/17/2013, 9:04 AM

## 2013-09-17 NOTE — Progress Notes (Signed)
Patient ID: Phillip Maldonado  male  CLE:[id number]    DOB: [birth year]    DOA: 09/15/2013  PCP: Liliane Bade  Assessment/Plan: Principal Problem:   Near syncope with chest pain - Patient reports episodic dizziness for the last 1 week with chest pain, currently no chest pain  -Troponins x3 negative so far -  2-D echo in 11/14 showed EF of 65-70%, no regional wall motion abnormalities, mild aortic stenosis - Cardiology consulted, patient was seen by Dr. Doylene Canard, recommended nuclear medicine stress Myoview  - Patient also reported vague complaints of decreased sensation on the right side, in the palms and feet intermittently, CT head was done which was negative for any acute intracranial pathology - Patient also has a number of vague complaints, appears to have some anxiety component, placed on low dose of Xanax as needed, patient to followup with his PCP. -He's ambulating without any difficulty or any chest pain, dizziness   Active Problems:    HTN (hypertension) - Currently stable    Diabetes mellitus -  patient was placed on sliding scale insulin  History of gastric bypass and prior upper GI bleed - Patient had mentioned black-colored bowel movements x2, stool occult test was however negative. Patient has been tolerating diet without any difficulty. CT abdomen and pelvis was done which showed no acute intra-abdominal findings, cholelithiasis or gallbladder sludge    Chronic pain syndrome - Continue methadone, Neurontin, Cymbalta  History of anxiety disorder - Continue Cymbalta, Effexor, trazodone  DVT Prophylaxis: SCDs  Code Status:  Family Communication:  Disposition: Hopefully  today if stress test is negative  Consultants:  Cardiology, Dr. Doylene Canard  Procedures:  None  Antibiotics:  None    Subjective: Patient seen and examined,  feels a whole lot better, no complaints today, awaiting stress test  Objective: Weight change: 0.318 kg (11.2  oz)  Intake/Output Summary (Last 24 hours) at 09/17/13 1052 Last data filed at 09/17/13 0900  Gross per 24 hour  Intake    343 ml  Output      0 ml  Net    343 ml   Blood pressure 136/58, pulse 77, temperature 97.4 F (36.3 C), temperature source Oral, resp. rate 18, height 5\' 10"  (1.778 m), weight 179.488 kg (395 lb 11.2 oz), SpO2 100.00%.  Physical Exam: General: Alert and awake, oriented x3, not in any acute distress. CVS: S1-S2 clear, no murmur rubs or gallops Chest: CTAB Abdomen: Morbidly obese, soft NT, NBS  Extremities: no cyanosis, clubbing bilaterally Neuro: Cranial nerves II-XII intact, no focal neurological deficits  Lab Results: Basic Metabolic Panel:  Recent Labs Lab 09/16/13 0243 09/17/13 0415  NA 134* 135*  K 4.3 4.7  CL 93* 97  CO2 23 24  GLUCOSE 146* 175*  BUN 12 11  CREATININE 0.78 0.81  CALCIUM 9.3 9.1   Liver Function Tests:  Recent Labs Lab 09/15/13 1618 09/16/13 0243  AST 32 33  ALT 27 23  ALKPHOS 66 59  BILITOT 0.5 0.5  PROT 7.9 7.3  ALBUMIN 4.0 3.7    Recent Labs Lab 09/15/13 1618  LIPASE 25   No results found for this basename: AMMONIA,  in the last 168 hours CBC:  Recent Labs Lab 09/16/13 0243 09/17/13 0415  WBC 9.1 8.2  NEUTROABS 5.4  --   HGB 10.6* 9.8*  HCT 35.9* 34.3*  MCV 75.6* 76.1*  PLT 294 260   Cardiac Enzymes:  Recent Labs Lab 09/15/13 1618 09/16/13 0730 09/16/13 1320 09/16/13 2000  CKTOTAL 107  --   --   --   TROPONINI <0.30 <0.30 <0.30 <0.30   BNP: No components found with this basename: POCBNP,  CBG:  Recent Labs Lab 09/16/13 0754 09/16/13 1156 09/16/13 1654 09/16/13 2129 09/17/13 0741  GLUCAP 153* 216* 182* 210* 169*     Micro Results: No results found for this or any previous visit (from the past 240 hour(s)).  Studies/Results: Ct Head Wo Contrast  09/16/2013   CLINICAL DATA:  Generalized weakness  EXAM: CT HEAD WITHOUT CONTRAST  TECHNIQUE: Contiguous axial images were obtained  from the base of the skull through the vertex without intravenous contrast.  COMPARISON:  None.  FINDINGS: There is no evidence of mass effect, midline shift or extra-axial fluid collections. There is no evidence of a space-occupying lesion or intracranial hemorrhage. There is no evidence of a cortical-based area of acute infarction.  The ventricles and sulci are appropriate for the patient's age. The basal cisterns are patent.  Visualized portions of the orbits are unremarkable. The visualized portions of the paranasal sinuses and mastoid air cells are unremarkable.  The osseous structures are unremarkable.  IMPRESSION: No acute intracranial pathology.   Electronically Signed   By: Kathreen Devoid   On: 09/16/2013 08:38   Ct Abdomen Pelvis W Contrast  09/15/2013   CLINICAL DATA:  Left lower quadrant pain  EXAM: CT ABDOMEN AND PELVIS WITH CONTRAST  TECHNIQUE: Multidetector CT imaging of the abdomen and pelvis was performed using the standard protocol following bolus administration of intravenous contrast.  CONTRAST:  169mL OMNIPAQUE IOHEXOL 300 MG/ML  SOLN  COMPARISON:  None.  FINDINGS: BODY WALL: Gynecomastia; asymmetry is likely related to partial imaging on the right.  LOWER CHEST: Coronary atherosclerosis.  ABDOMEN/PELVIS:  Liver: Diffuse fatty infiltration of the liver. Lobulated circumscribed subcapsular mass in segment 2-3 is most consistent with cyst.  Biliary: Gallbladder distention with dependent high-density material.  Pancreas: Unremarkable.  Spleen: Unremarkable.  Adrenals: Unremarkable.  Kidneys and ureters: 4 mm stone in the lower pole right kidney, nonobstructive. No hydronephrosis on either side.  Bladder: Unremarkable.  Reproductive: Mild prostate enlargement, deforming the bladder base. Cystic change in the left aspect of the gland is likely degenerative cyst.  Bowel: There are surgical changes of gastric bypass. Per the electronic medical record, there was reversal, which explains contrast within  the gastric body and antrum. This patient with recent history of marginal ulcer - there is no inflammatory changes around the gastrojejunostomy. There is no evidence of perforation. No bowel obstruction. Distal colonic diverticulosis. No pericecal inflammation.  Retroperitoneum: No mass or adenopathy.  Peritoneum: No ascites or pneumoperitoneum.  Vascular: No acute abnormality.  OSSEOUS: Severe and diffuse degenerative disc disease. There is posterior rod and pedicle screw fixation from L1-L4, with complete posterior bony fusion. Ankylosis continues cranially into the lower and mid thoracic spine. Laminectomy at L5-S1.  IMPRESSION: 1. No acute intra-abdominal findings. 2. Cholelithiasis or gallbladder sludge. 3. Roux-en-Y gastric bypass status post reversal. 4. Colonic diverticulosis. 5. Right nephrolithiasis.   Electronically Signed   By: Jorje Guild M.D.   On: 09/15/2013 22:28    Medications: Scheduled Meds: . amLODipine  10 mg Oral Daily  . atorvastatin  40 mg Oral Daily  . DULoxetine  60 mg Oral Daily  . gabapentin  1,600 mg Oral 2 times per day  . gabapentin  800 mg Oral Q24H  . insulin aspart  0-15 Units Subcutaneous TID WC  . insulin aspart  0-5 Units Subcutaneous  QHS  . insulin glargine  10 Units Subcutaneous Daily  . methadone  10 mg Oral TID  . metoprolol  50 mg Oral BID  . multivitamin with minerals  1 tablet Oral Daily  . pantoprazole  40 mg Oral BID  . potassium chloride SA  20 mEq Oral BID  . regadenoson      . sodium chloride  3 mL Intravenous Q12H  . traZODone  300 mg Oral QHS  . venlafaxine XR  150 mg Oral Daily      LOS: 2 days   Keiandra Sullenger M.D. [facility] 09/17/2013, 10:52 AM Pager: [phone]  If 7PM-7AM, please contact night-coverage [email or web address] Password [email or web address]  **Disclaimer: This note was dictated with voice recognition software. Similar sounding words can inadvertently be transcribed and this note may contain transcription errors which may not have  been corrected upon publication of note.**

## 2013-09-17 NOTE — Progress Notes (Signed)
Discharging pt hemodynamically stable.

## 2013-09-18 NOTE — ED Provider Notes (Signed)
Medical screening examination/treatment/procedure(s) were conducted as a shared visit with non-physician practitioner(s) and myself.  I personally evaluated the patient during the encounter.   EKG Interpretation   Date/Time:  Friday September 15 2013 16:23:43 EDT Ventricular Rate:  118 PR Interval:  158 QRS Duration: 130 QT Interval:  348 QTC Calculation: 487 R Axis:   -31 Text Interpretation:  Sinus tachycardia Left axis deviation Right bundle  branch block Abnormal ECG RBBB is new compared to 01/2013 Confirmed by  [place] ([id number]) on 09/15/2013 5:41:27 PM       Patient has multiple complaints, primary appears to be weakness and near-syncope. New RBBB is of uncertain etiology. Will admit  Ephraim Hamburger, MD 09/18/13 1200

## 2013-09-26 DIAGNOSIS — M47816 Spondylosis without myelopathy or radiculopathy, lumbar region: Secondary | ICD-10-CM | POA: Insufficient documentation

## 2013-12-07 ENCOUNTER — Emergency Department (HOSPITAL_COMMUNITY)
Admission: EM | Admit: 2013-12-07 | Discharge: 2013-12-07 | Disposition: A | Discharge status: Home / Self Care | Payer: Medicare Other | Attending: Emergency Medicine | Admitting: Emergency Medicine

## 2013-12-07 ENCOUNTER — Encounter (HOSPITAL_COMMUNITY): Payer: Self-pay | Admitting: Emergency Medicine

## 2013-12-07 ENCOUNTER — Emergency Department (HOSPITAL_COMMUNITY): Payer: Medicare Other

## 2013-12-07 DIAGNOSIS — Z91040 Latex allergy status: Secondary | ICD-10-CM | POA: Insufficient documentation

## 2013-12-07 DIAGNOSIS — Z8739 Personal history of other diseases of the musculoskeletal system and connective tissue: Secondary | ICD-10-CM | POA: Diagnosis not present

## 2013-12-07 DIAGNOSIS — F411 Generalized anxiety disorder: Secondary | ICD-10-CM | POA: Insufficient documentation

## 2013-12-07 DIAGNOSIS — F3289 Other specified depressive episodes: Secondary | ICD-10-CM | POA: Diagnosis not present

## 2013-12-07 DIAGNOSIS — Z79899 Other long term (current) drug therapy: Secondary | ICD-10-CM | POA: Diagnosis not present

## 2013-12-07 DIAGNOSIS — R079 Chest pain, unspecified: Secondary | ICD-10-CM | POA: Diagnosis not present

## 2013-12-07 DIAGNOSIS — G709 Myoneural disorder, unspecified: Secondary | ICD-10-CM | POA: Diagnosis not present

## 2013-12-07 DIAGNOSIS — F329 Major depressive disorder, single episode, unspecified: Secondary | ICD-10-CM | POA: Insufficient documentation

## 2013-12-07 DIAGNOSIS — I4891 Unspecified atrial fibrillation: Secondary | ICD-10-CM | POA: Diagnosis not present

## 2013-12-07 DIAGNOSIS — D649 Anemia, unspecified: Secondary | ICD-10-CM | POA: Diagnosis not present

## 2013-12-07 DIAGNOSIS — I209 Angina pectoris, unspecified: Secondary | ICD-10-CM | POA: Insufficient documentation

## 2013-12-07 DIAGNOSIS — Z87448 Personal history of other diseases of urinary system: Secondary | ICD-10-CM | POA: Insufficient documentation

## 2013-12-07 DIAGNOSIS — G4733 Obstructive sleep apnea (adult) (pediatric): Secondary | ICD-10-CM | POA: Diagnosis not present

## 2013-12-07 DIAGNOSIS — E119 Type 2 diabetes mellitus without complications: Secondary | ICD-10-CM | POA: Diagnosis not present

## 2013-12-07 DIAGNOSIS — Z794 Long term (current) use of insulin: Secondary | ICD-10-CM | POA: Insufficient documentation

## 2013-12-07 DIAGNOSIS — R0602 Shortness of breath: Secondary | ICD-10-CM | POA: Diagnosis not present

## 2013-12-07 DIAGNOSIS — Z9981 Dependence on supplemental oxygen: Secondary | ICD-10-CM | POA: Diagnosis not present

## 2013-12-07 DIAGNOSIS — Z8701 Personal history of pneumonia (recurrent): Secondary | ICD-10-CM | POA: Insufficient documentation

## 2013-12-07 DIAGNOSIS — I10 Essential (primary) hypertension: Secondary | ICD-10-CM | POA: Diagnosis not present

## 2013-12-07 DIAGNOSIS — R609 Edema, unspecified: Secondary | ICD-10-CM | POA: Diagnosis not present

## 2013-12-07 DIAGNOSIS — I509 Heart failure, unspecified: Secondary | ICD-10-CM | POA: Diagnosis not present

## 2013-12-07 DIAGNOSIS — Z8719 Personal history of other diseases of the digestive system: Secondary | ICD-10-CM | POA: Insufficient documentation

## 2013-12-07 LAB — CBC
HCT: 37 % — ABNORMAL LOW (ref 39.0–52.0)
Hemoglobin: 11.2 g/dL — ABNORMAL LOW (ref 13.0–17.0)
MCH: 23.2 pg — ABNORMAL LOW (ref 26.0–34.0)
MCHC: 30.3 g/dL (ref 30.0–36.0)
MCV: 76.6 fL — ABNORMAL LOW (ref 78.0–100.0)
PLATELETS: 258 10*3/uL (ref 150–400)
RBC: 4.83 MIL/uL (ref 4.22–5.81)
RDW: 18.7 % — ABNORMAL HIGH (ref 11.5–15.5)
WBC: 7.9 10*3/uL (ref 4.0–10.5)

## 2013-12-07 LAB — I-STAT TROPONIN, ED: TROPONIN I, POC: 0 ng/mL (ref 0.00–0.08)

## 2013-12-07 LAB — BASIC METABOLIC PANEL
ANION GAP: 18 — AB (ref 5–15)
BUN: 18 mg/dL (ref 6–23)
CHLORIDE: 94 meq/L — AB (ref 96–112)
CO2: 27 mEq/L (ref 19–32)
Calcium: 9.3 mg/dL (ref 8.4–10.5)
Creatinine, Ser: 0.89 mg/dL (ref 0.50–1.35)
GFR calc non Af Amer: 90 mL/min — ABNORMAL LOW (ref >90)
Glucose, Bld: 201 mg/dL — ABNORMAL HIGH (ref 70–99)
Potassium: 4.5 mEq/L (ref 3.7–5.3)
SODIUM: 139 meq/L (ref 137–147)

## 2013-12-07 LAB — PRO B NATRIURETIC PEPTIDE: PRO B NATRI PEPTIDE: 61.8 pg/mL (ref 0–125)

## 2013-12-07 MED ORDER — FUROSEMIDE 10 MG/ML IJ SOLN
80.0000 mg | Freq: Once | INTRAMUSCULAR | Status: AC
  Administered 2013-12-07: 80 mg via INTRAVENOUS
  Filled 2013-12-07: qty 8

## 2013-12-07 NOTE — ED Notes (Signed)
Pt c/o mid chest tightness that started last night. Then this morning started to have sob with exertion. Pt c/o increased leg swelling, hx of CHF sts that he used to have a home health nurse come and wrap his legs. Pt reports he feels like he is in fluid overload. Nad, skin warm and dry, resp e/u.

## 2013-12-07 NOTE — ED Provider Notes (Signed)
CSN: [id number]     Arrival date & time 12/07/13  1212 History   First MD Initiated Contact with Patient 12/07/13 1400     No chief complaint on file.    (Consider location/radiation/quality/duration/timing/severity/associated sxs/prior Treatment) HPI Comments: Patient presents to the ER for evaluation of chest tightness and difficulty breathing. Patient reports that he has noticed a heaviness in the center and left side of his chest since last night. Patient reports that he has worsens when he walks. He has been short of breath and this worsens with exertion as well. Patient does have history of congestive heart failure. He reports a 15-20 pound weight gain over the last 4 days. He has noticed increased swelling of his legs over this period of time as well.   Past Medical History  Diagnosis Date  . CHF (congestive heart failure)   . Hypertension   . Anemia   . Sleep apnea, obstructive     does where bipap  . Angina   . Shortness of breath   . Anxiety   . Blood transfusion   . Headache(784.0)   . Pneumonia   . Depression   . Hepatitis Hep B  . Neuromuscular disorder DJD  . Arthritis   . Diabetes mellitus 09/18/2011  . Atrial fibrillation with RVR 01/24/2013  . Renal insufficiency    Past Surgical History  Procedure Laterality Date  . Back surgery    . Stomach surgery      Gastric [phone]; Reversed in 1992 and revision 1994  . Diagnostic laparoscopy    . Esophagoscopy N/A 01/01/2013    Procedure: ESOPHAGOSCOPY;  Surgeon: Jeryl Columbia, MD;  Location: [facility];  Service: Endoscopy;  Laterality: N/A;  . Esophagogastroduodenoscopy N/A 01/03/2013    Procedure: ESOPHAGOGASTRODUODENOSCOPY (EGD);  Surgeon: Arta Silence, MD;  Location: [facility];  Service: Endoscopy;  Laterality: N/A;  . Esophagogastroduodenoscopy N/A 01/03/2013    Procedure: ESOPHAGOGASTRODUODENOSCOPY (EGD);  Surgeon: Arta Silence, MD;  Location: [facility];  Service: Endoscopy;  Laterality: N/A;  .  Esophagogastroduodenoscopy N/A 01/23/2013    Procedure: ESOPHAGOGASTRODUODENOSCOPY (EGD);  Surgeon: Lear Ng, MD;  Location: [facility];  Service: Endoscopy;  Laterality: N/A;  . Esophagogastroduodenoscopy Left 01/27/2013    Procedure: ESOPHAGOGASTRODUODENOSCOPY (EGD);  Surgeon: Lear Ng, MD;  Location: [facility];  Service: Endoscopy;  Laterality: Left;   Family History  Problem Relation Age of Onset  . Heart disease Father   . Skin cancer Father   . COPD Father   . Hypertension Father   . Heart disease Maternal Uncle    History  Substance Use Topics  . Smoking status: Never Smoker   . Smokeless tobacco: Not on file  . Alcohol Use: No    Review of Systems  Respiratory: Positive for chest tightness and shortness of breath.   All other systems reviewed and are negative.     Allergies  Adhesive; Feldene; and Latex  Home Medications   Prior to Admission medications   Medication Sig Start Date End Date Taking? Authorizing Provider  amLODipine (NORVASC) 10 MG tablet Take 1 tablet (10 mg total) by mouth daily. 01/30/13  Yes Samella Parr, NP  atorvastatin (LIPITOR) 40 MG tablet Take 40 mg by mouth daily.   Yes Historical Provider, MD  DULoxetine (CYMBALTA) 60 MG capsule Take 60 mg by mouth daily.   Yes Historical Provider, MD  gabapentin (NEURONTIN) 800 MG tablet Take 800-1,600 mg by mouth See admin instructions. 1600mg  in the morning, 800mg  in the afternoon,  and 1600mg  at night.   Yes Historical Provider, MD  glucose blood (ACCU-CHEK AVIVA PLUS) test strip 1 each by Other route See admin instructions. Check blood sugar twice daily   Yes Historical Provider, MD  Insulin Glargine (LANTUS SOLOSTAR) 100 UNIT/ML SOPN Inject 10 Units into the skin daily. 01/30/13  Yes Samella Parr, NP  Insulin Pen Needle (NOVOFINE) 30G X 8 MM MISC Inject 1 packet into the skin See admin instructions. Use with insulin once daily.   Yes Historical Provider, MD  lisinopril  (PRINIVIL,ZESTRIL) 5 MG tablet Take 5 mg by mouth daily.   Yes Historical Provider, MD  metFORMIN (GLUCOPHAGE) 1000 MG tablet Take 1,000 mg by mouth 2 (two) times daily with a meal.   Yes Historical Provider, MD  methadone (DOLOPHINE) 10 MG tablet Take 1 tablet (10 mg total) by mouth 3 (three) times daily. 01/05/13  Yes Shanker Kristeen Mans, MD  metoprolol (LOPRESSOR) 50 MG tablet Take 1 tablet (50 mg total) by mouth 2 (two) times daily. 01/30/13  Yes Samella Parr, NP  Multiple Vitamin (MULITIVITAMIN WITH MINERALS) TABS Take 1 tablet by mouth daily.   Yes Historical Provider, MD  nitroGLYCERIN (NITROSTAT) 0.4 MG SL tablet Place 0.4 mg under the tongue every 5 (five) minutes as needed. For chest pain   Yes Historical Provider, MD  pantoprazole (PROTONIX) 40 MG tablet Take 1 tablet (40 mg total) by mouth 2 (two) times daily. 01/30/13  Yes Samella Parr, NP  potassium chloride SA (K-DUR,KLOR-CON) 20 MEQ tablet Take 20 mEq by mouth daily.   Yes Historical Provider, MD  torsemide (DEMADEX) 20 MG tablet Take 40 mg by mouth 2 (two) times daily.   Yes Historical Provider, MD  traZODone (DESYREL) 100 MG tablet Take 300 mg by mouth at bedtime.    Yes Historical Provider, MD   BP 134/78  Pulse 84  Temp(Src) 98.6 F (37 C) (Oral)  Resp 22  Ht 5\' 9"  (1.753 m)  Wt 408 lb (185.068 kg)  BMI 60.22 kg/m2  SpO2 96% Physical Exam  Constitutional: He is oriented to person, place, and time. He appears well-developed and well-nourished. No distress.  HENT:  Head: Normocephalic and atraumatic.  Right Ear: Hearing normal.  Left Ear: Hearing normal.  Nose: Nose normal.  Mouth/Throat: Oropharynx is clear and moist and mucous membranes are normal.  Eyes: Conjunctivae and EOM are normal. Pupils are equal, round, and reactive to light.  Neck: Normal range of motion. Neck supple.  Cardiovascular: Regular rhythm, S1 normal and S2 normal.  Exam reveals no gallop and no friction rub.   No murmur  heard. Pulmonary/Chest: Effort normal. No respiratory distress. He has decreased breath sounds. He exhibits no tenderness.  Abdominal: Soft. Normal appearance and bowel sounds are normal. There is no hepatosplenomegaly. There is no tenderness. There is no rebound, no guarding, no tenderness at McBurney's point and negative Murphy's sign. No hernia.  Musculoskeletal: Normal range of motion. He exhibits edema.  Neurological: He is alert and oriented to person, place, and time. He has normal strength. No cranial nerve deficit or sensory deficit. Coordination normal. GCS eye subscore is 4. GCS verbal subscore is 5. GCS motor subscore is 6.  Skin: Skin is warm, dry and intact. No rash noted. No cyanosis.  Psychiatric: He has a normal mood and affect. His speech is normal and behavior is normal. Thought content normal.    ED Course  Procedures (including critical care time) Labs Review Labs Reviewed  CBC -  Abnormal; Notable for the following:    Hemoglobin 11.2 (*)    HCT 37.0 (*)    MCV 76.6 (*)    MCH 23.2 (*)    RDW 18.7 (*)    All other components within normal limits  BASIC METABOLIC PANEL - Abnormal; Notable for the following:    Chloride 94 (*)    Glucose, Bld 201 (*)    GFR calc non Af Amer 90 (*)    Anion gap 18 (*)    All other components within normal limits  PRO B NATRIURETIC PEPTIDE  I-STAT TROPOININ, ED    Imaging Review Dg Chest 2 View  12/07/2013   CLINICAL DATA:  Chest tightness, shortness of breath  EXAM: CHEST  2 VIEW  COMPARISON:  Nuclear medicine cardiac stress test 09/16/2013; chest x-ray 01/24/2013  FINDINGS: Cardiac and mediastinal contours remain within normal limits. Negative for edema, focal airspace consolidation pleural effusion or pneumothorax. Stable mild bronchitic changes. Numerous surgical clips in the left upper quadrant and mid epigastric region. Incompletely imaged anterior and posterior cervicothoracic stabilization hardware and posterior lumbar  stabilization hardware. Multilevel bridging anterior osteophytes.  IMPRESSION: No active cardiopulmonary disease.   Electronically Signed   By: Jacqulynn Cadet M.D.   On: 12/07/2013 14:52     EKG Interpretation   Date/Time:  Thursday December 07 2013 12:23:25 EDT Ventricular Rate:  83 PR Interval:  186 QRS Duration: 96 QT Interval:  396 QTC Calculation: 465 R Axis:   -18 Text Interpretation:  Normal sinus rhythm Incomplete right bundle branch  block Borderline ECG No significant change since last tracing Confirmed by  Dorn Hartshorne  MD, [place] [phone]) on 12/07/2013 2:26:26 PM      MDM   Final diagnoses:  None  chest pain Peripheral edema   Presents to the ER for evaluation of shortness of breath. Patient reports that he has been experiencing increased weight gain, increased edema in his legs, 15-20 pound or more weight gain over the past few days to one week. Reports a previous history of congestive heart failure. He developed tightness in his chest earlier, but thinks it secondary to the "fluid". Inpatient records reveals that he was hospitalized a couple of weeks ago for similar symptoms. At that time it was felt that his chest pain was noncardiac in origin. Today does not show any evidence of decompensated congestive heart failure. BNP is reassuring. Chest x-ray does not show any overt failure. EKG did not show any changes from previous EKG. Troponin negative. This administered Lasix here in the ER for diuresis of the peripheral edema that he is experiencing. He will be discharged to followup with his primary doctor in the office for repeat evaluation. Return if symptoms worsen.    Orpah Greek, MD 12/07/13 [phone]

## 2013-12-07 NOTE — Discharge Instructions (Signed)
Chest Pain (Nonspecific) °It is often hard to give a specific diagnosis for the cause of chest pain. There is always a chance that your pain could be related to something serious, such as a heart attack or a blood clot in the lungs. You need to follow up with your health care provider for further evaluation. °CAUSES  °· Heartburn. °· Pneumonia or bronchitis. °· Anxiety or stress. °· Inflammation around your heart (pericarditis) or lung (pleuritis or pleurisy). °· A blood clot in the lung. °· A collapsed lung (pneumothorax). It can develop suddenly on its own (spontaneous pneumothorax) or from trauma to the chest. °· Shingles infection (herpes zoster virus). °The chest wall is composed of bones, muscles, and cartilage. Any of these can be the source of the pain. °· The bones can be bruised by injury. °· The muscles or cartilage can be strained by coughing or overwork. °· The cartilage can be affected by inflammation and become sore (costochondritis). °DIAGNOSIS  °Lab tests or other studies may be needed to find the cause of your pain. Your health care provider may have you take a test called an ambulatory electrocardiogram (ECG). An ECG records your heartbeat patterns over a 24-hour period. You may also have other tests, such as: °· Transthoracic echocardiogram (TTE). During echocardiography, sound waves are used to evaluate how blood flows through your heart. °· Transesophageal echocardiogram (TEE). °· Cardiac monitoring. This allows your health care provider to monitor your heart rate and rhythm in real time. °· Holter monitor. This is a portable device that records your heartbeat and can help diagnose heart arrhythmias. It allows your health care provider to track your heart activity for several days, if needed. °· Stress tests by exercise or by giving medicine that makes the heart beat faster. °TREATMENT  °· Treatment depends on what may be causing your chest pain. Treatment may include: °¨ Acid blockers for  heartburn. °¨ Anti-inflammatory medicine. °¨ Pain medicine for inflammatory conditions. °¨ Antibiotics if an infection is present. °· You may be advised to change lifestyle habits. This includes stopping smoking and avoiding alcohol, caffeine, and chocolate. °· You may be advised to keep your head raised (elevated) when sleeping. This reduces the chance of acid going backward from your stomach into your esophagus. °Most of the time, nonspecific chest pain will improve within 2-3 days with rest and mild pain medicine.  °HOME CARE INSTRUCTIONS  °· If antibiotics were prescribed, take them as directed. Finish them even if you start to feel better. °· For the next few days, avoid physical activities that bring on chest pain. Continue physical activities as directed. °· Do not use any tobacco products, including cigarettes, chewing tobacco, or electronic cigarettes. °· Avoid drinking alcohol. °· Only take medicine as directed by your health care provider. °· Follow your health care provider's suggestions for further testing if your chest pain does not go away. °· Keep any follow-up appointments you made. If you do not go to an appointment, you could develop lasting (chronic) problems with pain. If there is any problem keeping an appointment, call to reschedule. °SEEK MEDICAL CARE IF:  °· Your chest pain does not go away, even after treatment. °· You have a rash with blisters on your chest. °· You have a fever. °SEEK IMMEDIATE MEDICAL CARE IF:  °· You have increased chest pain or pain that spreads to your arm, neck, jaw, back, or abdomen. °· You have shortness of breath. °· You have an increasing cough, or you cough   up blood.  You have severe back or abdominal pain.  You feel nauseous or vomit.  You have severe weakness.  You faint.  You have chills. This is an emergency. Do not wait to see if the pain will go away. Get medical help at once. Call your local emergency services (911 in [place] Do not drive  yourself to the hospital. MAKE SURE YOU:   Understand these instructions.  Will watch your condition.  Will get help right away if you are not doing well or get worse. Document Released: 12/10/2004 Document Revised: 03/07/2013 Document Reviewed: 10/06/2007 [facility] Patient Information 2015 [place], [place]. This information is not intended to replace advice given to you by your health care provider. Make sure you discuss any questions you have with your health care provider.  Edema Edema is an abnormal buildup of fluids in your bodytissues. Edema is somewhatdependent on gravity to pull the fluid to the lowest place in your body. That makes the condition more common in the legs and thighs (lower extremities). Painless swelling of the feet and ankles is common and becomes more likely as you get older. It is also common in looser tissues, like around your eyes.  When the affected area is squeezed, the fluid may move out of that spot and leave a dent for a few moments. This dent is called pitting.  CAUSES  There are many possible causes of edema. Eating too much salt and being on your feet or sitting for a long time can cause edema in your legs and ankles. Hot weather may make edema worse. Common medical causes of edema include:  Heart failure.  Liver disease.  Kidney disease.  Weak blood vessels in your legs.  Cancer.  An injury.  Pregnancy.  Some medications.  Obesity. SYMPTOMS  Edema is usually painless.Your skin may look swollen or shiny.  DIAGNOSIS  Your health care provider may be able to diagnose edema by asking about your medical history and doing a physical exam. You may need to have tests such as X-rays, an electrocardiogram, or blood tests to check for medical conditions that may cause edema.  TREATMENT  Edema treatment depends on the cause. If you have heart, liver, or kidney disease, you need the treatment appropriate for these conditions. General treatment may  include:  Elevation of the affected body part above the level of your heart.  Compression of the affected body part. Pressure from elastic bandages or support stockings squeezes the tissues and forces fluid back into the blood vessels. This keeps fluid from entering the tissues.  Restriction of fluid and salt intake.  Use of a water pill (diuretic). These medications are appropriate only for some types of edema. They pull fluid out of your body and make you urinate more often. This gets rid of fluid and reduces swelling, but diuretics can have side effects. Only use diuretics as directed by your health care provider. HOME CARE INSTRUCTIONS   Keep the affected body part above the level of your heart when you are lying down.   Do not sit still or stand for prolonged periods.   Do not put anything directly under your knees when lying down.  Do not wear constricting clothing or garters on your upper legs.   Exercise your legs to work the fluid back into your blood vessels. This may help the swelling go down.   Wear elastic bandages or support stockings to reduce ankle swelling as directed by your health care provider.  Eat a low-salt diet to reduce fluid if your health care provider recommends it.   Only take medicines as directed by your health care provider. SEEK MEDICAL CARE IF:   Your edema is not responding to treatment.  You have heart, liver, or kidney disease and notice symptoms of edema.  You have edema in your legs that does not improve after elevating them.   You have sudden and unexplained weight gain. SEEK IMMEDIATE MEDICAL CARE IF:   You develop shortness of breath or chest pain.   You cannot breathe when you lie down.  You develop pain, redness, or warmth in the swollen areas.   You have heart, liver, or kidney disease and suddenly get edema.  You have a fever and your symptoms suddenly get worse. MAKE SURE YOU:   Understand these  instructions.  Will watch your condition.  Will get help right away if you are not doing well or get worse. Document Released: 03/02/2005 Document Revised: 07/17/2013 Document Reviewed: 12/23/2012 [facility] Patient Information 2015 [place], [place]. This information is not intended to replace advice given to you by your health care provider. Make sure you discuss any questions you have with your health care provider.

## 2013-12-07 NOTE — Progress Notes (Signed)
  CARE MANAGEMENT ED NOTE 12/07/2013  Patient:  Phillip Maldonado, Phillip Maldonado   Account Number:  [id number]  Date Initiated:  12/07/2013  Documentation initiated by:  Livia Snellen  Subjective/Objective Assessment:   Patient presents to Ed with chest tightness and shortness of breath, increased swelling in bilateral lower extremities.     Subjective/Objective Assessment Detail:   Patient with pmhx of CHF, HTN, DM, pneumonia, afib     Action/Plan:   Action/Plan Detail:   Anticipated DC Date:  12/07/2013     Status Recommendation to Physician:   Result of Recommendation:    Other ED Services  Consult Working [place]  CM consult  Other   [facility] Choice  HOME HEALTH   Choice offered to / List presented to:  C-1 Patient     [place] arranged  HH-1 RN  [place]      [place]    Status of service:    ED Comments:   ED Comments Detail:  [facility] received phone call from [place] regarding home health services.  EDCM asked Scott to give phone to patient that [facility] could speak to him.  Patient reports that he had home health with [facility], but one day, "the nurse called me and said that they wouldn't be able to come out anymore to wrap my legs because the doctor hasn't written any orders to wrap your legs."  Patient confirms tah his previous pcp "let me go because I missed two appointments, I wasn't feeling good."  Patient reports he has found a new doctor Dr. Blanchie Serve at [facility] and has an appointment on 12/26/2013 at 1015am to establish care. EDCM spoke to Vivian at 1649pm who reports will accept patient to home health services.  EDCM spoke to EDP Dr. Darl Householder at 1654pm who has placed orders for RN, PT, OT ans social worker with face to face.  [facility] faxed home health orders to [facility] at 1719pm, with confirmation of receipt at 1721pm.  EDCM spoke to [facility] at 1708pm and updated him regarding home health  services.  EDCM provided EDRN with phone number of [facility] to provide to the patient.  No further EDCM needs at this time.

## 2013-12-18 NOTE — Progress Notes (Signed)
12/18/2013 1407 Forms completed and faxed to [phone] with successful fax confirmation  1112 Cm received a call from mary of [facility] pt not scheduled to see pcp [facility] for 3 weeks Request assist with Dr Betsey Holiday signing off 12/07/13 home health orders forms so [facility] can continue to see pt until pcp able to pick up orders

## 2013-12-26 ENCOUNTER — Encounter: Payer: Self-pay | Admitting: Internal Medicine

## 2013-12-26 ENCOUNTER — Ambulatory Visit (INDEPENDENT_AMBULATORY_CARE_PROVIDER_SITE_OTHER): Payer: Medicare Other | Admitting: Internal Medicine

## 2013-12-26 VITALS — BP 154/80 | HR 100 | Temp 98.3°F | Resp 12 | Ht 67.0 in | Wt >= 6400 oz

## 2013-12-26 DIAGNOSIS — E1140 Type 2 diabetes mellitus with diabetic neuropathy, unspecified: Secondary | ICD-10-CM

## 2013-12-26 DIAGNOSIS — K5909 Other constipation: Secondary | ICD-10-CM

## 2013-12-26 DIAGNOSIS — E119 Type 2 diabetes mellitus without complications: Secondary | ICD-10-CM | POA: Insufficient documentation

## 2013-12-26 DIAGNOSIS — R198 Other specified symptoms and signs involving the digestive system and abdomen: Secondary | ICD-10-CM

## 2013-12-26 DIAGNOSIS — E7800 Pure hypercholesterolemia, unspecified: Secondary | ICD-10-CM

## 2013-12-26 DIAGNOSIS — I509 Heart failure, unspecified: Secondary | ICD-10-CM

## 2013-12-26 DIAGNOSIS — K922 Gastrointestinal hemorrhage, unspecified: Secondary | ICD-10-CM

## 2013-12-26 DIAGNOSIS — I10 Essential (primary) hypertension: Secondary | ICD-10-CM

## 2013-12-26 DIAGNOSIS — IMO0002 Reserved for concepts with insufficient information to code with codable children: Secondary | ICD-10-CM

## 2013-12-26 DIAGNOSIS — I739 Peripheral vascular disease, unspecified: Secondary | ICD-10-CM | POA: Insufficient documentation

## 2013-12-26 DIAGNOSIS — G894 Chronic pain syndrome: Secondary | ICD-10-CM

## 2013-12-26 DIAGNOSIS — R194 Change in bowel habit: Secondary | ICD-10-CM

## 2013-12-26 DIAGNOSIS — R600 Localized edema: Secondary | ICD-10-CM

## 2013-12-26 DIAGNOSIS — E1165 Type 2 diabetes mellitus with hyperglycemia: Secondary | ICD-10-CM

## 2013-12-26 HISTORY — DX: Reserved for concepts with insufficient information to code with codable children: IMO0002

## 2013-12-26 HISTORY — DX: Type 2 diabetes mellitus with diabetic neuropathy, unspecified: E11.40

## 2013-12-26 MED ORDER — METHADONE HCL 10 MG PO TABS: 10.0000 mg | ORAL_TABLET | Freq: Three times a day (TID) | ORAL | Status: DC

## 2013-12-26 MED ORDER — TORSEMIDE 20 MG PO TABS: 80.0000 mg | ORAL_TABLET | Freq: Two times a day (BID) | ORAL | Status: DC

## 2013-12-26 MED ORDER — TRAZODONE HCL 150 MG PO TABS: 150.0000 mg | ORAL_TABLET | Freq: Every day | ORAL | Status: DC

## 2013-12-26 MED ORDER — GABAPENTIN 800 MG PO TABS: 800.0000 mg | ORAL_TABLET | ORAL | Status: DC

## 2013-12-26 NOTE — Progress Notes (Signed)
Patient ID: Phillip Maldonado, male   DOB: 02-23-1951, 63 y.o.   MRN: [id number]      Allergies  Allergen Reactions  . Adhesive [Tape] Other (See Comments)    unknown  . Feldene [Piroxicam] Other (See Comments)    unknown  . Latex Other (See Comments)    Rash and itching    Chief Complaint  Patient presents with  . Establish Care    New Patient establish care   . Leg Problem    Long-term (5 years or longer) patient with redness and swelling of lower legs off/on   . Constipation    Long-term constipation   . Discuss Internal Staples    Patient would like to discuss staples inside stomach possibly causing internal bleeding     HPI:  63 y/o male patient is here to establish care. He was getting primary care at [facility]. He was last seen by the group 3 months back. No records for review He has hx of HTN, constipation, chronic pain syndrome, HLD, constipation, DM type 2, recurrent gi bleed, depression He follows in pain clinic Dr Wylene Men and gets gabapentin, trazodone and methadone from him. He recently got steroid injection to his hip and lower back He has been having abdominal pain on and off. He has hx of gastric bypass, recurrent gi bleed. Denies any recent rectal bleed or blood in stool or melena He has started having altered bowel movement of constipation f/b bouts of loose stools. Has ocassional abdominal cramps but denies vomiting or nausea. He though feels queasy at times. No recent colonoscopy. Does not have a gi doctor. Has stopped taking sucralfate as he ran out of the med and cost issue.  Currently taking protonix He has been having redness and swelling of both his legs on and off for 5 years. The swelling has worsened recently. He mentions having hx of chf and seeing dr Einar Gip in the past- lost to follow up  Review of Systems:  Constitutional: Negative for fever, chills, diaphoresis.  HENT: Negative for congestion, hearing loss and sore throat.   Eyes:  Negative for  blurred vision, discharge.  Respiratory: Negative for cough, sputum production, shortness of breath and wheezing.   Cardiovascular: Negative for chest pain, palpitations. Has leg swelling.  Genitourinary: Negative for dysuria Musculoskeletal: Negative for falls, uses a cane Skin: Negative for itching and rash.  Neurological: Negative for dizziness, tingling, focal weakness and headaches.  Psychiatric/Behavioral: positive for depression     Past Medical History  Diagnosis Date  . CHF (congestive heart failure)   . Hypertension   . Anemia   . Sleep apnea, obstructive     does where bipap  . Angina   . Shortness of breath   . Anxiety   . Blood transfusion   . Headache(784.0)   . Pneumonia   . Depression   . Hepatitis Hep B  . Neuromuscular disorder DJD  . Arthritis   . Diabetes mellitus 09/18/2011  . Atrial fibrillation with RVR 01/24/2013  . Renal insufficiency    Past Surgical History  Procedure Laterality Date  . Back surgery      2009,2011  . Stomach surgery      Gastric [phone]; Reversed in 1992 and revision 1994  . Diagnostic laparoscopy    . Esophagoscopy N/A 01/01/2013    Procedure: ESOPHAGOSCOPY;  Surgeon: Jeryl Columbia, MD;  Location: [facility];  Service: Endoscopy;  Laterality: N/A;  . Esophagogastroduodenoscopy N/A 01/03/2013    Procedure:  ESOPHAGOGASTRODUODENOSCOPY (EGD);  Surgeon: Arta Silence, MD;  Location: [facility];  Service: Endoscopy;  Laterality: N/A;  . Esophagogastroduodenoscopy N/A 01/03/2013    Procedure: ESOPHAGOGASTRODUODENOSCOPY (EGD);  Surgeon: Arta Silence, MD;  Location: [facility];  Service: Endoscopy;  Laterality: N/A;  . Esophagogastroduodenoscopy N/A 01/23/2013    Procedure: ESOPHAGOGASTRODUODENOSCOPY (EGD);  Surgeon: Lear Ng, MD;  Location: [facility];  Service: Endoscopy;  Laterality: N/A;  . Esophagogastroduodenoscopy Left 01/27/2013    Procedure: ESOPHAGOGASTRODUODENOSCOPY (EGD);  Surgeon: Lear Ng, MD;  Location: [facility];  Service: Endoscopy;  Laterality: Left;  . Knee surgery Left 2004  . Hydrocele excision  1996    x 2   . Abdominal surgery      [phone]   Social History:   reports that he has never smoked. He does not have any smokeless tobacco history on file. He reports that he does not drink alcohol or use illicit drugs.  Family History  Problem Relation Age of Onset  . Heart disease Father   . Skin cancer Father   . COPD Father   . Hypertension Father   . Heart disease Maternal Uncle   . High blood pressure Sister   . High blood pressure Sister   . High blood pressure Son   . Diabetes Daughter   . Depression Son   . Depression Son   . Stroke Father   . Arthritis Mother   . Arthritis Sister   . Arthritis Sister   . Osteoporosis Mother   . Cancer Maternal Grandmother   . Heart disease Maternal Uncle   . Heart disease Paternal Uncle     Medications: Patient's Medications  New Prescriptions   No medications on file  Previous Medications   AMLODIPINE (NORVASC) 10 MG TABLET    Take 1 tablet (10 mg total) by mouth daily.   ATORVASTATIN (LIPITOR) 40 MG TABLET    Take 40 mg by mouth daily.   DOCUSATE SODIUM (COLACE) 100 MG CAPSULE    Take 100 mg by mouth. 2 in am, 2 in evening   DULOXETINE (CYMBALTA) 60 MG CAPSULE    Take 60 mg by mouth daily.   GABAPENTIN (NEURONTIN) 800 MG TABLET    Take 800-1,600 mg by mouth See admin instructions. 2 by mouth every morning, 1 at lunch, and 2 at bedtime   GLUCOSE BLOOD (ACCU-CHEK AVIVA PLUS) TEST STRIP    1 each by Other route See admin instructions. Check blood sugar twice daily   INSULIN GLARGINE (LANTUS SOLOSTAR) 100 UNIT/ML SOPN    Inject 10 Units into the skin daily.   INSULIN PEN NEEDLE (NOVOFINE) 30G X 8 MM MISC    Inject 1 packet into the skin See admin instructions. Use with insulin once daily.   LISINOPRIL (PRINIVIL,ZESTRIL) 5 MG TABLET    Take 5 mg by mouth daily.   METFORMIN (GLUCOPHAGE) 1000 MG TABLET     Take 1,000 mg by mouth 2 (two) times daily with a meal.   METHADONE (DOLOPHINE) 10 MG TABLET    Take 1 tablet (10 mg total) by mouth 3 (three) times daily.   METOPROLOL (LOPRESSOR) 50 MG TABLET    Take 1 tablet (50 mg total) by mouth 2 (two) times daily.   MULTIPLE VITAMIN (MULITIVITAMIN WITH MINERALS) TABS    Take 1 tablet by mouth daily.   NITROGLYCERIN (NITROSTAT) 0.4 MG SL TABLET    Place 0.4 mg under the tongue every 5 (five) minutes as needed. For chest pain  PANTOPRAZOLE (PROTONIX) 40 MG TABLET    Take 1 tablet (40 mg total) by mouth 2 (two) times daily.   POTASSIUM CHLORIDE SA (K-DUR,KLOR-CON) 20 MEQ TABLET    Take 20 mEq by mouth daily.   PSYLLIUM (METAMUCIL) 58.6 % PACKET    Take 1 packet by mouth daily.   SPIRONOLACTONE (ALDACTONE) 50 MG TABLET    Take 50 mg by mouth daily.   TORSEMIDE (DEMADEX) 20 MG TABLET    Take 40 mg by mouth 2 (two) times daily.   TRAZODONE (DESYREL) 150 MG TABLET    2 by mouth at bedtime  Modified Medications   No medications on file  Discontinued Medications   TRAZODONE (DESYREL) 100 MG TABLET    Take 300 mg by mouth at bedtime.      Physical Exam: Filed Vitals:   12/26/13 1003  BP: 154/80  Pulse: 100  Temp: 98.3 F (36.8 C)  TempSrc: Oral  Resp: 12  Height: 5\' 7"  (1.702 m)  Weight: 409 lb (185.521 kg)  SpO2: 95%   Wt Readings from Last 3 Encounters:  12/26/13 409 lb (185.521 kg)  12/07/13 408 lb (185.068 kg)  09/17/13 395 lb 11.2 oz (179.488 kg)   General- elderly male in no acute distress, obese Head- atraumatic, normocephalic Eyes- PERRLA, EOMI, no pallor, no icterus, no discharge Neck- no lymphadenopathy Cardiovascular- normal s1,s2, no murmurs/ rubs/ gallops Respiratory- decreased breath sounds, no wheeze, no rhonchi, no crackles, no use of accessory muscles Abdomen- bowel sounds present, soft, non tender, several surgical scars Musculoskeletal- able to move all 4 extremities, edema 2+ in both legs, uses a cane Neurological- no  focal deficit Skin- warm and dry, erythematous in both legs, no skin breakdown Psychiatry- alert and oriented to person, place and time, normal mood and affect    Labs reviewed: Basic Metabolic Panel:  Recent Labs  09/16/13 0243 09/17/13 0415 12/07/13 1222  NA 134* 135* 139  K 4.3 4.7 4.5  CL 93* 97 94*  CO2 23 24 27   GLUCOSE 146* 175* 201*  BUN 12 11 18   CREATININE 0.78 0.81 0.89  CALCIUM 9.3 9.1 9.3   Liver Function Tests:  Recent Labs  01/23/13 1025 09/15/13 1618 09/16/13 0243  AST 25 32 33  ALT 22 27 23   ALKPHOS 49 66 59  BILITOT 0.3 0.5 0.5  PROT 5.5* 7.9 7.3  ALBUMIN 2.5* 4.0 3.7    Recent Labs  01/23/13 0205 09/15/13 1618  LIPASE 25 25   No results found for this basename: AMMONIA,  in the last 8760 hours CBC:  Recent Labs  01/23/13 0205  09/15/13 1618 09/16/13 0243 09/17/13 0415 12/07/13 1222  WBC 13.4*  < > 8.6 9.1 8.2 7.9  NEUTROABS 9.5*  --  5.8 5.4  --   --   HGB 6.5*  < > 11.2* 10.6* 9.8* 11.2*  HCT 20.5*  < > 37.4* 35.9* 34.3* 37.0*  MCV 91.1  < > 75.3* 75.6* 76.1* 76.6*  PLT 353  < > 328 294 260 258  < > = values in this interval not displayed. Cardiac Enzymes:  Recent Labs  09/15/13 1618 09/16/13 0730 09/16/13 1320 09/16/13 2000  CKTOTAL 107  --   --   --   TROPONINI <0.30 <0.30 <0.30 <0.30   BNP: No components found with this basename: POCBNP,  CBG:  Recent Labs  09/16/13 2129 09/17/13 0741 09/17/13 1150  GLUCAP 210* 169* 231*   Lab Results  Component Value Date   HGBA1C  9.1* 09/17/2013   Radiological Exams: Dg Chest 2 View  12/07/2013   CLINICAL DATA:  Chest tightness, shortness of breath  EXAM: CHEST  2 VIEW  COMPARISON:  Nuclear medicine cardiac stress test 09/16/2013; chest x-ray 01/24/2013  FINDINGS: Cardiac and mediastinal contours remain within normal limits. Negative for edema, focal airspace consolidation pleural effusion or pneumothorax. Stable mild bronchitic changes. Numerous surgical clips in the left  upper quadrant and mid epigastric region. Incompletely imaged anterior and posterior cervicothoracic stabilization hardware and posterior lumbar stabilization hardware. Multilevel bridging anterior osteophytes.  IMPRESSION: No active cardiopulmonary disease.   Electronically Signed   By: Jacqulynn Cadet M.D.   On: 12/07/2013 14:52    EKG Interpretation Date/Time:  Thursday December 07 2013 12:23:25 EDT Ventricular Rate:  83 PR Interval:  186 QRS Duration: 96 QT Interval:  396 QTC Calculation: 465 R Axis:   -18 Text Interpretation:  Normal sinus rhythm Incomplete right bundle branch   block Borderline ECG No significant change since last tracing Confirmed by   POLLINA  MD, CHRISTOPHER [id number]) on 12/07/2013 2:26:26 PM      Assessment/Plan  1. Essential hypertension Stable, cotninue amlodipine, lisinopril, lopressor and monitor bp.  - CMP - Lipid Panel - CBC with Differential  2. Other constipation Continue colace, with altered bowel movement and complicated gi surgeries and bleed, referring to gi for further assessment - CBC with Differential - Ambulatory referral to Gastroenterology  3. DM type 2, uncontrolled, with neuropathy Continue lantus, metformin and lipitor with lisinopril for now - CMP - Lipid Panel - CBC with Differential - TSH - Hemoglobin A1c - Microalbumin/Creatinine Ratio, Urine  4. UGIB (upper gastrointestinal bleed) currnetly asymptomatic, will make gi referral, continue ppi  5. Chronic pain syndrome F/u with pain clinic, no med changes made  6. Hypercholesterolemia Continue lipitor for now - Lipid Panel  7. Bilateral edema of lower extremity diuretic dose increased, see below  8. PVD (peripheral vascular disease) Leg elevation, diuretics for now. Get prior records  9. Chronic congestive heart failure, unspecified congestive heart failure type Edema on exam , appears to have chronic venous stasis changes. Continue lisinopril, lopressor, torsemide  and spironolactone for now. Increase torsemide to 80 mg bid for now. Reassess next visit. Review records from dr ganji's office  10. Altered bowel function ? IBS. Hx of gastric bypass and marginal ulcer bleedings. Refer to GI for further evaluation and need of colonoscopy. Continue PPI for now - Ambulatory referral to Gastroenterology

## 2013-12-27 ENCOUNTER — Encounter: Payer: Self-pay | Admitting: Internal Medicine

## 2013-12-27 LAB — LIPID PANEL
Chol/HDL Ratio: 3.4 ratio units (ref 0.0–5.0)
Cholesterol, Total: 153 mg/dL (ref 100–199)
HDL: 45 mg/dL (ref >39)
LDL Calculated: 67 mg/dL (ref 0–99)
Triglycerides: 206 mg/dL — ABNORMAL HIGH (ref 0–149)
VLDL Cholesterol Cal: 41 mg/dL — ABNORMAL HIGH (ref 5–40)

## 2013-12-27 LAB — MICROALBUMIN / CREATININE URINE RATIO
CREATININE UR: 137.3 mg/dL (ref 22.0–328.0)
MICROALB/CREAT RATIO: 7.2 mg/g{creat} (ref 0.0–30.0)
MICROALBUM., U, RANDOM: 9.9 ug/mL (ref 0.0–17.0)

## 2013-12-27 LAB — COMPREHENSIVE METABOLIC PANEL
A/G RATIO: 1.4 (ref 1.1–2.5)
ALT: 39 IU/L (ref 0–44)
AST: 50 IU/L — ABNORMAL HIGH (ref 0–40)
Albumin: 4 g/dL (ref 3.6–4.8)
Alkaline Phosphatase: 58 IU/L (ref 39–117)
BUN/Creatinine Ratio: 14 (ref 10–22)
BUN: 15 mg/dL (ref 8–27)
CO2: 26 mmol/L (ref 18–29)
Calcium: 9.6 mg/dL (ref 8.6–10.2)
Chloride: 92 mmol/L — ABNORMAL LOW (ref 97–108)
Creatinine, Ser: 1.05 mg/dL (ref 0.76–1.27)
GFR calc Af Amer: 88 mL/min/{1.73_m2} (ref >59)
GFR, EST NON AFRICAN AMERICAN: 76 mL/min/{1.73_m2} (ref >59)
Globulin, Total: 2.8 g/dL (ref 1.5–4.5)
Glucose: 212 mg/dL — ABNORMAL HIGH (ref 65–99)
Potassium: 4.4 mmol/L (ref 3.5–5.2)
SODIUM: 137 mmol/L (ref 134–144)
Total Bilirubin: 0.3 mg/dL (ref 0.0–1.2)
Total Protein: 6.8 g/dL (ref 6.0–8.5)

## 2013-12-27 LAB — CBC WITH DIFFERENTIAL/PLATELET
BASOS: 1 %
Basophils Absolute: 0 10*3/uL (ref 0.0–0.2)
EOS: 3 %
Eosinophils Absolute: 0.2 10*3/uL (ref 0.0–0.4)
HEMATOCRIT: 35.7 % — AB (ref 37.5–51.0)
Hemoglobin: 11 g/dL — ABNORMAL LOW (ref 12.6–17.7)
Immature Grans (Abs): 0 10*3/uL (ref 0.0–0.1)
Immature Granulocytes: 0 %
LYMPHS ABS: 1.5 10*3/uL (ref 0.7–3.1)
Lymphs: 21 %
MCH: 23.8 pg — ABNORMAL LOW (ref 26.6–33.0)
MCHC: 30.8 g/dL — ABNORMAL LOW (ref 31.5–35.7)
MCV: 77 fL — AB (ref 79–97)
MONOS ABS: 0.5 10*3/uL (ref 0.1–0.9)
Monocytes: 8 %
Neutrophils Absolute: 4.8 10*3/uL (ref 1.4–7.0)
Neutrophils Relative %: 67 %
RBC: 4.63 x10E6/uL (ref 4.14–5.80)
RDW: 19.3 % — AB (ref 12.3–15.4)
WBC: 7 10*3/uL (ref 3.4–10.8)

## 2013-12-27 LAB — TSH: TSH: 1.47 u[IU]/mL (ref 0.450–4.500)

## 2013-12-27 LAB — HEMOGLOBIN A1C
ESTIMATED AVERAGE GLUCOSE: 240 mg/dL
Hgb A1c MFr Bld: 10 % — ABNORMAL HIGH (ref 4.8–5.6)

## 2013-12-28 ENCOUNTER — Telehealth: Payer: Self-pay | Admitting: *Deleted

## 2013-12-28 NOTE — Telephone Encounter (Signed)
[name] with [employer] called and just confirmed that Dr. Bubba Camp is patient's Primary Provider. # [phone]

## 2013-12-28 NOTE — Telephone Encounter (Signed)
Louie Casa with [facility]  [phone] called and stated that he goes out and see's patient for Home Health. He just wanted to know if Dr. Bubba Camp had ordered any wraps for patient's leg. I looked in Dr. Bruna Potter notes and nothing is stated about wraps. He stated he was just checking because patient was confused. Dr. Bubba Camp stated that diuretics increased and will reassess on next visit.

## 2013-12-29 ENCOUNTER — Telehealth: Payer: Self-pay | Admitting: *Deleted

## 2013-12-29 NOTE — Telephone Encounter (Signed)
Patient called and scheduled a appointment on 01/09/2014 @ 12:15pm per Dr. Bubba Camp.

## 2013-12-29 NOTE — Telephone Encounter (Signed)
Message copied by Eilene Ghazi on Fri Dec 29, 2013  4:48 PM ------      Message from: Blanchie Serve      Created: Fri Dec 29, 2013  3:49 PM       Please make appointment to review blood work due to abnormal readings next week. ------

## 2014-01-01 ENCOUNTER — Telehealth: Payer: Self-pay | Admitting: *Deleted

## 2014-01-01 NOTE — Progress Notes (Signed)
ED Cm discussed with Lelon Mast that pt changed pcps from Dr Jeanie Cooks to Dr Ulyess Mort and was seen on 12/26/13 per EPIC notes Cm also left message for Reina Fuse at [phone] ext [phone] Further home health orders need to be provided from new pcp

## 2014-01-01 NOTE — Telephone Encounter (Signed)
Phillip Maldonado with [facility] called and stated that Patient showed up at his office and wanted to be seen due to blisters on his legs. Home Health nurse is going out to evaluate him tomorrow and will send his recommendation and evaluation.

## 2014-01-09 ENCOUNTER — Ambulatory Visit (INDEPENDENT_AMBULATORY_CARE_PROVIDER_SITE_OTHER): Payer: Medicare Other | Admitting: Internal Medicine

## 2014-01-09 ENCOUNTER — Encounter: Payer: Self-pay | Admitting: Internal Medicine

## 2014-01-09 VITALS — BP 146/92 | HR 100 | Temp 98.3°F | Resp 12 | Wt >= 6400 oz

## 2014-01-09 DIAGNOSIS — E7800 Pure hypercholesterolemia, unspecified: Secondary | ICD-10-CM

## 2014-01-09 DIAGNOSIS — B3749 Other urogenital candidiasis: Secondary | ICD-10-CM

## 2014-01-09 DIAGNOSIS — I509 Heart failure, unspecified: Secondary | ICD-10-CM

## 2014-01-09 DIAGNOSIS — E1140 Type 2 diabetes mellitus with diabetic neuropathy, unspecified: Secondary | ICD-10-CM

## 2014-01-09 DIAGNOSIS — E1165 Type 2 diabetes mellitus with hyperglycemia: Secondary | ICD-10-CM

## 2014-01-09 DIAGNOSIS — I10 Essential (primary) hypertension: Secondary | ICD-10-CM

## 2014-01-09 DIAGNOSIS — IMO0002 Reserved for concepts with insufficient information to code with codable children: Secondary | ICD-10-CM

## 2014-01-09 MED ORDER — FLUCONAZOLE 150 MG PO TABS: 150.0000 mg | ORAL_TABLET | Freq: Once | ORAL | Status: DC

## 2014-01-09 MED ORDER — NYSTATIN-TRIAMCINOLONE 100000-0.1 UNIT/GM-% EX OINT: 1.0000 "application " | TOPICAL_OINTMENT | Freq: Two times a day (BID) | CUTANEOUS | Status: DC

## 2014-01-09 MED ORDER — INSULIN GLARGINE 100 UNIT/ML SOLOSTAR PEN: 18.0000 [IU] | PEN_INJECTOR | Freq: Every day | SUBCUTANEOUS | Status: DC

## 2014-01-09 NOTE — Progress Notes (Signed)
Patient ID: Phillip Maldonado, male   DOB: 04/26/1950, 63 y.o.   MRN: [id number]    Chief Complaint  Patient presents with  . Follow-up    Discuss labs (copy printed)    Allergies  Allergen Reactions  . Adhesive [Tape] Other (See Comments)    unknown  . Feldene [Piroxicam] Other (See Comments)    unknown  . Latex Other (See Comments)    Rash and itching   HPI 63 y/o male patient is here to review lab results. He has noticed redness on his bottom that is now bothering him. He thinks he has a cyst on his behind that is bothering him. He would like his skin assessed to seen for need of a dermatologist His cbg has been running high at home between 200-350s/ she is on lantus 10 u with metformin 1000 mg bid at present.  he has noticed redness with discomfort in his groin area. he mentions the area being raw  He has hx of HTN, constipation, chronic pain syndrome, HLD, constipation, DM type 2, recurrent gi bleed, depression  Review of Systems Constitutional: Negative for fever, chills, diaphoresis.  HENT: Negative for congestion, hearing loss and sore throat.   Eyes: Negative for  blurred vision, discharge.  Respiratory: Negative for cough, sputum production, shortness of breath and wheezing.   Cardiovascular: Negative for chest pain, palpitations. Has leg swelling.  Genitourinary: Negative for dysuria Musculoskeletal: Negative for falls, uses a cane Neurological: Negative for dizziness, tingling, focal weakness and headaches.  Psychiatric/Behavioral: positive for depression        Past Medical History  Diagnosis Date  . CHF (congestive heart failure)   . Hypertension   . Anemia   . Sleep apnea, obstructive     does where bipap  . Angina   . Shortness of breath   . Anxiety   . Blood transfusion   . Headache(784.0)   . Pneumonia   . Depression   . Hepatitis Hep B  . Neuromuscular disorder DJD  . Arthritis   . Diabetes mellitus 09/18/2011  . Atrial fibrillation with RVR  01/24/2013  . Renal insufficiency      Current Outpatient Prescriptions on File Prior to Visit  Medication Sig Dispense Refill  . amLODipine (NORVASC) 10 MG tablet Take 1 tablet (10 mg total) by mouth daily. 30 tablet 6  . atorvastatin (LIPITOR) 40 MG tablet Take 40 mg by mouth daily.    Marland Kitchen docusate sodium (COLACE) 100 MG capsule Take 100 mg by mouth. 2 in am, 2 in evening    . DULoxetine (CYMBALTA) 60 MG capsule Take 60 mg by mouth daily.    Marland Kitchen gabapentin (NEURONTIN) 800 MG tablet Take 1-2 tablets (800-1,600 mg total) by mouth See admin instructions. 2 by mouth every morning, 1 at lunch, and 2 at bedtime. From pain clinic only 30 tablet 0  . glucose blood (ACCU-CHEK AVIVA PLUS) test strip 1 each by Other route See admin instructions. Check blood sugar twice daily    . Insulin Pen Needle (NOVOFINE) 30G X 8 MM MISC Inject 1 packet into the skin See admin instructions. Use with insulin once daily.    Marland Kitchen lisinopril (PRINIVIL,ZESTRIL) 5 MG tablet Take 5 mg by mouth daily.    . metFORMIN (GLUCOPHAGE) 1000 MG tablet Take 1,000 mg by mouth 2 (two) times daily with a meal.    . methadone (DOLOPHINE) 10 MG tablet Take 1 tablet (10 mg total) by mouth 3 (three) times daily. 30 tablet 0  .  metoprolol (LOPRESSOR) 50 MG tablet Take 1 tablet (50 mg total) by mouth 2 (two) times daily. 60 tablet 6  . Multiple Vitamin (MULITIVITAMIN WITH MINERALS) TABS Take 1 tablet by mouth daily.    . nitroGLYCERIN (NITROSTAT) 0.4 MG SL tablet Place 0.4 mg under the tongue every 5 (five) minutes as needed. For chest pain    . pantoprazole (PROTONIX) 40 MG tablet Take 1 tablet (40 mg total) by mouth 2 (two) times daily. 60 tablet 2  . potassium chloride SA (K-DUR,KLOR-CON) 20 MEQ tablet Take 20 mEq by mouth daily.    . psyllium (METAMUCIL) 58.6 % packet Take 1 packet by mouth daily.    Marland Kitchen spironolactone (ALDACTONE) 50 MG tablet Take 50 mg by mouth daily.    Marland Kitchen torsemide (DEMADEX) 20 MG tablet Take 4 tablets (80 mg total) by mouth  2 (two) times daily. 240 tablet 3  . traZODone (DESYREL) 150 MG tablet Take 1 tablet (150 mg total) by mouth at bedtime. 2 by mouth at bedtime 30 tablet 0   No current facility-administered medications on file prior to visit.   Physical exam BP 146/92 mmHg  Pulse 100  Temp(Src) 98.3 F (36.8 C) (Oral)  Resp 12  Wt 412 lb (186.882 kg)  SpO2 94%  Wt Readings from Last 3 Encounters:  01/09/14 412 lb (186.882 kg)  12/26/13 409 lb (185.521 kg)  12/07/13 408 lb (185.068 kg)   General- elderly male in no acute distress, obese Head- atraumatic, normocephalic Eyes- PERRLA, EOMI, no pallor, no icterus, no discharge Neck- no lymphadenopathy Cardiovascular- normal s1,s2, no murmurs/ rubs/ gallops Respiratory- decreased breath sounds, no wheeze, no rhonchi, no crackles, no use of accessory muscles Abdomen- bowel sounds present, soft, non tender, several surgical scars Musculoskeletal- able to move all 4 extremities, edema 2+ in both legs, uses a cane Neurological- no focal deficit Skin- warm and dry, erythematous in both legs,has erythema in groin area and some in his buttock area, no cyst noted Psychiatry- alert and oriented to person, place and time, normal mood and affect  Labs reviewed  Lab Results  Component Value Date   WBC 7.0 12/26/2013   HGB 11.0* 12/26/2013   HCT 35.7* 12/26/2013   MCV 77* 12/26/2013   PLT 258 12/07/2013   CMP     Component Value Date/Time   NA 137 12/26/2013 1124   NA 139 12/07/2013 1222   K 4.4 12/26/2013 1124   CL 92* 12/26/2013 1124   CO2 26 12/26/2013 1124   GLUCOSE 212* 12/26/2013 1124   GLUCOSE 201* 12/07/2013 1222   BUN 15 12/26/2013 1124   BUN 18 12/07/2013 1222   CREATININE 1.05 12/26/2013 1124   CALCIUM 9.6 12/26/2013 1124   PROT 6.8 12/26/2013 1124   PROT 7.3 09/16/2013 0243   ALBUMIN 3.7 09/16/2013 0243   AST 50* 12/26/2013 1124   ALT 39 12/26/2013 1124   ALKPHOS 58 12/26/2013 1124   BILITOT 0.3 12/26/2013 1124   GFRNONAA 76  12/26/2013 1124   GFRAA 88 12/26/2013 1124   Lab Results  Component Value Date   HGBA1C 10.0* 12/26/2013   Lipid Panel     Component Value Date/Time   TRIG 206* 12/26/2013 1124   HDL 45 12/26/2013 1124   CHOLHDL 3.4 12/26/2013 1124   LDLCALC 67 12/26/2013 1124     Assessment/plan  DM With a1c of 10 and cbg review, s/o poorly controlled dm. Increase lantus to 18 u daily for now. Continue metformin 1000 mg bid. Continue ACEI  and statin. Monitor cbg  Candidal rash Give fluconazole 150 mg daily for 3 days with mycolog cream bid  Essential hypertension Elevated bp today. Pt mentions bp within desired range at home. cotninue amlodipine, lisinopril, lopressor and monitor bp. Warning signs with elevated bp explained - CMP - Lipid Panel - CBC with Differential  Hypercholesterolemia Continue lipitor for now  Chronic congestive heart failure Edema on exam , weight gain on review of records. Continue lisinopril, lopressor, torsemide and spironolactone for now.

## 2014-01-24 ENCOUNTER — Telehealth: Payer: Self-pay | Admitting: *Deleted

## 2014-01-24 NOTE — Telephone Encounter (Signed)
[name] -Nurse with [facility] called and left voicemail message and  stated that she needed patient's results from 12/26/2013 on his Microalbumin.  I called her back and left voicemail with results.

## 2014-02-06 ENCOUNTER — Ambulatory Visit: Payer: Medicare Other | Admitting: Internal Medicine

## 2014-02-12 ENCOUNTER — Telehealth: Payer: Self-pay | Admitting: *Deleted

## 2014-02-12 NOTE — Telephone Encounter (Signed)
Noted. Thanks. pls call pt tomorrow for f/u

## 2014-02-12 NOTE — Telephone Encounter (Signed)
Patient called and stated that he has gained 15 pounds in 2 days and having a hard time breathing. Patient stated that his cloths are not fitting and he cannot get behind the steering wheel in the car because he is so swollen. Patient stated that the swelling is everywhere. I have no available appointments for today so I advised patient to go to the Urgent Care center today to be evaluated. Patient agreed and stated that he will go. Told him to keep [place] informed.

## 2014-02-13 NOTE — Telephone Encounter (Signed)
Called patient to check on him. Patient did not go to the Urgent care and was upset because he could not get in here. Explained to him that at that time his symptoms were of Urgent matter and there were no available appointment here in the office and explained to him that he agreed with that decision and stated that he would go. He stated that he really didn't want to get up and get dressed and go answer questions to a stranger, that defeats the purpose of having a primary Phillip Maldonado patient an appointment for tomorrow with Phillip Maldonado to be evaluated. Patient agreed.

## 2014-02-14 ENCOUNTER — Encounter: Payer: Self-pay | Admitting: Internal Medicine

## 2014-02-14 ENCOUNTER — Ambulatory Visit (INDEPENDENT_AMBULATORY_CARE_PROVIDER_SITE_OTHER): Payer: Medicare Other | Admitting: Internal Medicine

## 2014-02-14 VITALS — BP 150/92 | HR 94 | Temp 98.5°F | Resp 22 | Ht 70.0 in | Wt >= 6400 oz

## 2014-02-14 DIAGNOSIS — I10 Essential (primary) hypertension: Secondary | ICD-10-CM

## 2014-02-14 DIAGNOSIS — E1140 Type 2 diabetes mellitus with diabetic neuropathy, unspecified: Secondary | ICD-10-CM

## 2014-02-14 DIAGNOSIS — E1165 Type 2 diabetes mellitus with hyperglycemia: Secondary | ICD-10-CM

## 2014-02-14 DIAGNOSIS — R601 Generalized edema: Secondary | ICD-10-CM

## 2014-02-14 DIAGNOSIS — G894 Chronic pain syndrome: Secondary | ICD-10-CM

## 2014-02-14 DIAGNOSIS — IMO0002 Reserved for concepts with insufficient information to code with codable children: Secondary | ICD-10-CM

## 2014-02-14 MED ORDER — DULOXETINE HCL 60 MG PO CPEP: ORAL_CAPSULE | ORAL | Status: DC

## 2014-02-14 MED ORDER — TORSEMIDE 100 MG PO TABS: ORAL_TABLET | ORAL | Status: DC

## 2014-02-14 MED ORDER — LISINOPRIL 20 MG PO TABS: ORAL_TABLET | ORAL | Status: DC

## 2014-02-14 NOTE — Progress Notes (Signed)
Patient ID: Phillip Maldonado, male   DOB: 1950/08/10, 63 y.o.   MRN: [id number]    Facility  [facility]    Place of Service:   OFFICE   Allergies  Allergen Reactions  . Adhesive [Tape] Other (See Comments)    unknown  . Feldene [Piroxicam] Other (See Comments)    unknown  . Latex Other (See Comments)    Rash and itching    Chief Complaint  Patient presents with  . Acute Visit    SOB and Swelling, headaches, abd pain,    HPI:   He brings a list of complaints:  Body/joints are swollen and very painful  Gained 15 pounds since 02/07/2014  Difficulty breathing  Hurting on the left rib cage and around to the left kidney  Legs are extremely swollen, painful, and red anteriorly  Previously had [facility] home [facility] coming to the home twice weekly to wrap legs but they discontinued the visit, stating that he was too mobile and was not at home.  Having hard stabbing pains in the abdomen  Headaches with blurred vision  Anasarca: Previously diagnosed. I have reviewed the records and it is not clear to me why this condition is present. Liver enzymes are normal, renal function tests are normal, left ventricular ejection fraction is 65-70% on both echocardiogram and stress test done by Dr. Doylene Canard. He is uncertain medications that may be associated with fluid retention. These include amlodipine and gabapentin. He has had an increase in the dosing of the gabapentin recently as his pain clinic. Amlodipine has been present for over a year at 10 mg daily.  Chronic pain syndrome: Neuropathy pain seemed to be the worst part of his chronic pain syndrome. He is on methadone 3 times daily as well as Cymbalta 60 mg daily and gabapentin at a very high dose.  DM type 2, uncontrolled, with neuropathy: This has been reasonably well controlled.  Essential hypertension: Controlled    Medications: Patient's Medications  New Prescriptions   No medications on file  Previous Medications   AMLODIPINE  (NORVASC) 10 MG TABLET    Take 1 tablet (10 mg total) by mouth daily.   ATORVASTATIN (LIPITOR) 40 MG TABLET    Take 40 mg by mouth daily.   DOCUSATE SODIUM (COLACE) 100 MG CAPSULE    Take 100 mg by mouth. 2 in am, 2 in evening   DULOXETINE (CYMBALTA) 60 MG CAPSULE    Take 60 mg by mouth daily.   FLUCONAZOLE (DIFLUCAN) 150 MG TABLET    Take 1 tablet (150 mg total) by mouth once.   GABAPENTIN (NEURONTIN) 800 MG TABLET    Take 1-2 tablets (800-1,600 mg total) by mouth See admin instructions. 2 by mouth every morning, 1 at lunch, and 2 at bedtime. From pain clinic only   GLUCOSE BLOOD (ACCU-CHEK AVIVA PLUS) TEST STRIP    1 each by Other route See admin instructions. Check blood sugar twice daily   INSULIN GLARGINE (LANTUS SOLOSTAR) 100 UNIT/ML SOLOSTAR PEN    Inject 18 Units into the skin daily.   INSULIN PEN NEEDLE (NOVOFINE) 30G X 8 MM MISC    Inject 1 packet into the skin See admin instructions. Use with insulin once daily.   LISINOPRIL (PRINIVIL,ZESTRIL) 5 MG TABLET    Take 5 mg by mouth daily.   METFORMIN (GLUCOPHAGE) 1000 MG TABLET    Take 1,000 mg by mouth 2 (two) times daily with a meal.   METHADONE (DOLOPHINE) 10 MG TABLET  Take 1 tablet (10 mg total) by mouth 3 (three) times daily.   METOPROLOL (LOPRESSOR) 50 MG TABLET    Take 1 tablet (50 mg total) by mouth 2 (two) times daily.   MULTIPLE VITAMIN (MULITIVITAMIN WITH MINERALS) TABS    Take 1 tablet by mouth daily.   NITROGLYCERIN (NITROSTAT) 0.4 MG SL TABLET    Place 0.4 mg under the tongue every 5 (five) minutes as needed. For chest pain   NYSTATIN-TRIAMCINOLONE OINTMENT (MYCOLOG)    Apply 1 application topically 2 (two) times daily.   PANTOPRAZOLE (PROTONIX) 40 MG TABLET    Take 1 tablet (40 mg total) by mouth 2 (two) times daily.   POTASSIUM CHLORIDE SA (K-DUR,KLOR-CON) 20 MEQ TABLET    Take 20 mEq by mouth daily.   PSYLLIUM (METAMUCIL) 58.6 % PACKET    Take 1 packet by mouth daily.   SPIRONOLACTONE (ALDACTONE) 50 MG TABLET    Take 50  mg by mouth daily.   TORSEMIDE (DEMADEX) 20 MG TABLET    Take 4 tablets (80 mg total) by mouth 2 (two) times daily.   TRAZODONE (DESYREL) 150 MG TABLET    Take 1 tablet (150 mg total) by mouth at bedtime. 2 by mouth at bedtime  Modified Medications   No medications on file  Discontinued Medications   No medications on file     Review of Systems  Constitutional: Negative for fever, activity change, appetite change, fatigue and unexpected weight change.       Morbidly obese history  HENT: Negative for congestion, ear pain, hearing loss, rhinorrhea, sore throat, tinnitus, trouble swallowing and voice change.   Eyes:       Corrective lenses  Respiratory: Negative for cough, choking, chest tightness, shortness of breath and wheezing.   Cardiovascular: Negative for chest pain, palpitations and leg swelling.  Gastrointestinal: Negative for nausea, abdominal pain, diarrhea, constipation and abdominal distention.  Endocrine: Negative for cold intolerance, heat intolerance, polydipsia, polyphagia and polyuria.  Genitourinary: Negative for dysuria, urgency, frequency and testicular pain.       Not incontinent  Musculoskeletal: Negative for myalgias, back pain, arthralgias, gait problem and neck pain.  Skin: Negative for color change, pallor and rash.       erythema of the anterior shins  Allergic/Immunologic: Negative.   Neurological: Negative for dizziness, tremors, syncope, speech difficulty, weakness, numbness and headaches.       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  Hematological: Negative for adenopathy. Does not bruise/bleed easily.  Psychiatric/Behavioral: Negative for hallucinations, behavioral problems, confusion, sleep disturbance and decreased concentration. The patient is not nervous/anxious.     Filed Vitals:   02/14/14 1223  BP: 150/92  Pulse: 94  Temp: 98.5 F (36.9 C)  TempSrc: Oral    Resp: 22  Height: '5\' 10"'  (1.778 m)  Weight: 417 lb 9.6 oz (189.422 kg)  SpO2: 95%   Body mass index is 59.92 kg/(m^2).  Physical Exam  Constitutional: He is oriented to person, place, and time. He appears well-developed and well-nourished. No distress.  Morbid obesity  HENT:  Right Ear: External ear normal.  Left Ear: External ear normal.  Nose: Nose normal.  Mouth/Throat: Oropharynx is clear and moist. No oropharyngeal exudate.  Eyes: Conjunctivae and EOM are normal. Pupils are equal, round, and reactive to light.  Neck: No JVD present. No tracheal deviation present. No thyromegaly present.  Cardiovascular: Normal rate, regular rhythm, normal heart  sounds and intact distal pulses.  Exam reveals no gallop and no friction rub.   No murmur heard. Pulmonary/Chest: No respiratory distress. He has no wheezes. He has no rales. He exhibits no tenderness.  Abdominal: He exhibits no distension and no mass. There is no tenderness.  Anasarca with intra-abdominal fluid.  Musculoskeletal: Normal range of motion. He exhibits no edema or tenderness.  Lymphadenopathy:    He has no cervical adenopathy.  Neurological: He is alert and oriented to person, place, and time. He has normal reflexes. No cranial nerve deficit. Coordination normal.  Skin: No rash noted. There is erythema (both shins). No pallor.  Psychiatric: He has a normal mood and affect. His behavior is normal. Judgment and thought content normal.     Labs reviewed: Office Visit on 12/26/2013  Component Date Value Ref Range Status  . Glucose 12/26/2013 212* 65 - 99 mg/dL Final  . BUN 12/26/2013 15  8 - 27 mg/dL Final  . Creatinine, Ser 12/26/2013 1.05  0.76 - 1.27 mg/dL Final  . GFR calc non Af Amer 12/26/2013 76  >59 mL/min/1.73 Final  . GFR calc Af Amer 12/26/2013 88  >59 mL/min/1.73 Final  . BUN/Creatinine Ratio 12/26/2013 14  10 - 22 Final  . Sodium 12/26/2013 137  134 - 144 mmol/L Final  . Potassium 12/26/2013 4.4  3.5 - 5.2  mmol/L Final  . Chloride 12/26/2013 92* 97 - 108 mmol/L Final  . CO2 12/26/2013 26  18 - 29 mmol/L Final  . Calcium 12/26/2013 9.6  8.6 - 10.2 mg/dL Final  . Total Protein 12/26/2013 6.8  6.0 - 8.5 g/dL Final  . Albumin 12/26/2013 4.0  3.6 - 4.8 g/dL Final  . Globulin, Total 12/26/2013 2.8  1.5 - 4.5 g/dL Final  . Albumin/Globulin Ratio 12/26/2013 1.4  1.1 - 2.5 Final  . Total Bilirubin 12/26/2013 0.3  0.0 - 1.2 mg/dL Final  . Alkaline Phosphatase 12/26/2013 58  39 - 117 IU/L Final  . AST 12/26/2013 50* 0 - 40 IU/L Final  . ALT 12/26/2013 39  0 - 44 IU/L Final  . Cholesterol, Total 12/26/2013 153  100 - 199 mg/dL Final   Comment: **Effective January 08, 2014 the reference interval**                            for Cholesterol, Total will be changing to:                                                   0 - 19 years       100 - 169                                                      >19 years       100 - 199  . Triglycerides 12/26/2013 206* 0 - 149 mg/dL Final   Comment: **Effective January 08, 2014 the reference interval**                            for Triglycerides will be changing to:  0 -  9 years         0 -  74                                                  10 - 19 years         0 -  89                                                      >19 years         0 - 149  . HDL 12/26/2013 45  >39 mg/dL Final   Comment: According to ATP-III Guidelines, HDL-C >59 mg/dL is considered a                          negative risk factor for CHD.  Marland Kitchen VLDL Cholesterol Cal 12/26/2013 41* 5 - 40 mg/dL Final  . LDL Calculated 12/26/2013 67  0 - 99 mg/dL Final   Comment: **Effective January 08, 2014 the reference interval**                            for LDL Cholesterol Calc will be changing to:                                                   0 - 19 years         0 - 109                                                      >19 years         0 - 99    . Chol/HDL Ratio 12/26/2013 3.4  0.0 - 5.0 ratio units Final   Comment:                                   T. Chol/HDL Ratio                                                                      Men  Women                                                        1/2 Avg.Risk  3.4    3.3  Avg.Risk  5.0    4.4                                                         2X Avg.Risk  9.6    7.1                                                         3X Avg.Risk 23.4   11.0  . WBC 12/26/2013 7.0  3.4 - 10.8 x10E3/uL Final  . RBC 12/26/2013 4.63  4.14 - 5.80 x10E6/uL Final  . Hemoglobin 12/26/2013 11.0* 12.6 - 17.7 g/dL Final  . HCT 12/26/2013 35.7* 37.5 - 51.0 % Final  . MCV 12/26/2013 77* 79 - 97 fL Final  . MCH 12/26/2013 23.8* 26.6 - 33.0 pg Final  . MCHC 12/26/2013 30.8* 31.5 - 35.7 g/dL Final  . RDW 12/26/2013 19.3* 12.3 - 15.4 % Final  . Neutrophils Relative % 12/26/2013 67   Final  . Lymphs 12/26/2013 21   Final  . Monocytes 12/26/2013 8   Final  . Eos 12/26/2013 3   Final  . Basos 12/26/2013 1   Final  . Neutrophils Absolute 12/26/2013 4.8  1.4 - 7.0 x10E3/uL Final  . Lymphocytes Absolute 12/26/2013 1.5  0.7 - 3.1 x10E3/uL Final  . Monocytes Absolute 12/26/2013 0.5  0.1 - 0.9 x10E3/uL Final  . Eosinophils Absolute 12/26/2013 0.2  0.0 - 0.4 x10E3/uL Final  . Basophils Absolute 12/26/2013 0.0  0.0 - 0.2 x10E3/uL Final  . Immature Granulocytes 12/26/2013 0   Final  . Immature Grans (Abs) 12/26/2013 0.0  0.0 - 0.1 x10E3/uL Final  . TSH 12/26/2013 1.470  0.450 - 4.500 uIU/mL Final  . Hgb A1c MFr Bld 12/26/2013 10.0* 4.8 - 5.6 % Final   Comment:          Increased risk for diabetes: 5.7 - 6.4                                   Diabetes: >6.4                                   Glycemic control for adults with diabetes: <7.0  . Est. average glucose Bld gHb Est-m* 12/26/2013 240   Final  . Creatinine, Ur 12/26/2013 137.3  22.0 - 328.0 mg/dL  Final  . Microalbum.,U,Random 12/26/2013 9.9  0.0 - 17.0 ug/mL Final  . MICROALB/CREAT RATIO 12/26/2013 7.2  0.0 - 30.0 mg/g creat Final  Admission on 12/07/2013, Discharged on 12/07/2013  Component Date Value Ref Range Status  . WBC 12/07/2013 7.9  4.0 - 10.5 K/uL Final  . RBC 12/07/2013 4.83  4.22 - 5.81 MIL/uL Final  . Hemoglobin 12/07/2013 11.2* 13.0 - 17.0 g/dL Final  . HCT 12/07/2013 37.0* 39.0 - 52.0 % Final  . MCV 12/07/2013 76.6* 78.0 - 100.0 fL Final  . MCH 12/07/2013 23.2* 26.0 - 34.0 pg Final  . MCHC 12/07/2013 30.3  30.0 - 36.0 g/dL Final  . RDW 12/07/2013  18.7* 11.5 - 15.5 % Final  . Platelets 12/07/2013 258  150 - 400 K/uL Final  . Sodium 12/07/2013 139  137 - 147 mEq/L Final  . Potassium 12/07/2013 4.5  3.7 - 5.3 mEq/L Final  . Chloride 12/07/2013 94* 96 - 112 mEq/L Final  . CO2 12/07/2013 27  19 - 32 mEq/L Final  . Glucose, Bld 12/07/2013 201* 70 - 99 mg/dL Final  . BUN 12/07/2013 18  6 - 23 mg/dL Final  . Creatinine, Ser 12/07/2013 0.89  0.50 - 1.35 mg/dL Final  . Calcium 12/07/2013 9.3  8.4 - 10.5 mg/dL Final  . GFR calc non Af Amer 12/07/2013 90* >90 mL/min Final  . GFR calc Af Amer 12/07/2013 >90  >90 mL/min Final   Comment: (NOTE)                          The eGFR has been calculated using the CKD EPI equation.                          This calculation has not been validated in all clinical situations.                          eGFR's persistently <90 mL/min signify possible Chronic Kidney                          Disease.  . Anion gap 12/07/2013 18* 5 - 15 Final  . Troponin i, poc 12/07/2013 0.00  0.00 - 0.08 ng/mL Final  . Comment 3 12/07/2013          Final   Comment: Due to the release kinetics of cTnI,                          a negative result within the first hours                          of the onset of symptoms does not rule out                          myocardial infarction with certainty.                          If myocardial infarction is still  suspected,                          repeat the test at appropriate intervals.  . Pro B Natriuretic peptide (BNP) 12/07/2013 61.8  0 - 125 pg/mL Final     Assessment/Plan 1. Anasarca I think this problem is aggravated by certain medications he is using: amlodipine and gabapentin. -Stop amlodipine -Try to stop gabapentin - torsemide (DEMADEX) 100 MG tablet; One twice daily for diuretic  Dispense: 60 tablet; Refill: 5 - TSH; Future  2. Chronic pain syndrome - DULoxetine (CYMBALTA) 60 MG capsule; One twice daily to help neuropathy  Dispense: 60 capsule; Refill: 5  3. DM type 2, uncontrolled, with neuropathy - Comprehensive metabolic panel; Future  4. Essential hypertension - lisinopril (PRINIVIL,ZESTRIL) 20 MG tablet; One daily to control BP and strengthen the heart  Dispense: 30 tablet; Refill: 3

## 2014-02-21 ENCOUNTER — Encounter: Payer: Self-pay | Admitting: Gastroenterology

## 2014-02-22 ENCOUNTER — Other Ambulatory Visit: Payer: Self-pay | Admitting: Internal Medicine

## 2014-02-27 ENCOUNTER — Encounter: Payer: Self-pay | Admitting: Internal Medicine

## 2014-02-27 ENCOUNTER — Ambulatory Visit (INDEPENDENT_AMBULATORY_CARE_PROVIDER_SITE_OTHER): Payer: Medicare Other | Admitting: Internal Medicine

## 2014-02-27 VITALS — BP 142/88 | HR 93 | Temp 98.0°F | Resp 22 | Ht 70.0 in | Wt >= 6400 oz

## 2014-02-27 DIAGNOSIS — IMO0002 Reserved for concepts with insufficient information to code with codable children: Secondary | ICD-10-CM

## 2014-02-27 DIAGNOSIS — R601 Generalized edema: Secondary | ICD-10-CM

## 2014-02-27 DIAGNOSIS — I10 Essential (primary) hypertension: Secondary | ICD-10-CM

## 2014-02-27 DIAGNOSIS — E1140 Type 2 diabetes mellitus with diabetic neuropathy, unspecified: Secondary | ICD-10-CM

## 2014-02-27 DIAGNOSIS — E1165 Type 2 diabetes mellitus with hyperglycemia: Secondary | ICD-10-CM

## 2014-02-27 DIAGNOSIS — K922 Gastrointestinal hemorrhage, unspecified: Secondary | ICD-10-CM

## 2014-02-28 ENCOUNTER — Ambulatory Visit (INDEPENDENT_AMBULATORY_CARE_PROVIDER_SITE_OTHER): Payer: Medicare Other | Admitting: Internal Medicine

## 2014-02-28 ENCOUNTER — Other Ambulatory Visit (INDEPENDENT_AMBULATORY_CARE_PROVIDER_SITE_OTHER): Payer: Medicare Other

## 2014-02-28 ENCOUNTER — Encounter: Payer: Self-pay | Admitting: Internal Medicine

## 2014-02-28 VITALS — BP 130/72 | HR 68 | Ht 70.0 in | Wt >= 6400 oz

## 2014-02-28 DIAGNOSIS — K760 Fatty (change of) liver, not elsewhere classified: Secondary | ICD-10-CM

## 2014-02-28 DIAGNOSIS — R748 Abnormal levels of other serum enzymes: Secondary | ICD-10-CM

## 2014-02-28 DIAGNOSIS — K5903 Drug induced constipation: Secondary | ICD-10-CM

## 2014-02-28 DIAGNOSIS — R740 Nonspecific elevation of levels of transaminase and lactic acid dehydrogenase [LDH]: Secondary | ICD-10-CM

## 2014-02-28 DIAGNOSIS — G8929 Other chronic pain: Secondary | ICD-10-CM

## 2014-02-28 DIAGNOSIS — K5909 Other constipation: Secondary | ICD-10-CM

## 2014-02-28 DIAGNOSIS — R1013 Epigastric pain: Secondary | ICD-10-CM

## 2014-02-28 DIAGNOSIS — T402X5A Adverse effect of other opioids, initial encounter: Secondary | ICD-10-CM

## 2014-02-28 DIAGNOSIS — Z9884 Bariatric surgery status: Secondary | ICD-10-CM

## 2014-02-28 LAB — HEPATIC FUNCTION PANEL
ALK PHOS: 53 U/L (ref 39–117)
ALT: 31 U/L (ref 0–53)
AST: 49 U/L — ABNORMAL HIGH (ref 0–37)
Albumin: 3.7 g/dL (ref 3.5–5.2)
BILIRUBIN TOTAL: 0.5 mg/dL (ref 0.2–1.2)
Bilirubin, Direct: 0.1 mg/dL (ref 0.0–0.3)
Total Protein: 7.4 g/dL (ref 6.0–8.3)

## 2014-02-28 LAB — HEPATITIS C ANTIBODY: HCV Ab: NEGATIVE

## 2014-02-28 LAB — HEPATITIS B CORE ANTIBODY, TOTAL: HEP B C TOTAL AB: NONREACTIVE

## 2014-02-28 LAB — HEPATITIS B SURFACE ANTIBODY,QUALITATIVE: Hep B S Ab: NEGATIVE

## 2014-02-28 LAB — HEPATITIS B SURFACE ANTIGEN: Hepatitis B Surface Ag: NEGATIVE

## 2014-02-28 MED ORDER — LUBIPROSTONE 24 MCG PO CAPS: 24.0000 ug | ORAL_CAPSULE | Freq: Two times a day (BID) | ORAL | Status: DC

## 2014-02-28 MED ORDER — POLYETHYLENE GLYCOL 3350 17 G PO PACK: 17.0000 g | PACK | Freq: Two times a day (BID) | ORAL | Status: DC

## 2014-02-28 NOTE — Progress Notes (Signed)
Referred by Dr. Pandey/Dr. Nyoka Cowden Subjective:    Patient ID: Phillip Maldonado, male    DOB: Feb 09, 1951, 63 y.o.   MRN: [id number]  HPI The patient is a middle-aged morbidly obese white man with multiple medical problems who is here because of constipation. The constipation is a chronic problem. It has been persistent for years and it is probably worse since he has been on chronic narcotics for back pain. He uses MiraLAX, Colace and fiber supplements and does have bowel movements most days, though they're incomplete and ineffective at times. Sometimes will have a relatively urgent very loose large bowel movement. There is no particular pattern. There has been no progressive change in his bowel habits. He had a colonoscopy about 5 years ago for screening, it sounds like he may have not had an adequate prep. I do not have those records. He does not complain of rectal bleeding. He does have a sensation of a knot and tender area in the epigastrium fairly chronically that might get a little bit better with defecation.  Last year he was hospitalized with a marginal ulcer that required multiple endoscopies and therapeutic attempts to try to control the bleeding. He saw Dr. Hassell Done of surgery in follow-up. Dr. Hassell Done was going to get records of his prior gastric bypass and revision surgeries. I don't think the patient followed up with him because he was concerned about possibly having another surgery though that was only entertained and not deemed necessary. He has not had bleeding since, he stays on a PPI.  He says he did lose weight after his last gastric bypass surgery and/or revision, but back pain problems and what sounds like depression led to increased weight gain. Allergies  Allergen Reactions  . Adhesive [Tape] Other (See Comments)    unknown  . Feldene [Piroxicam] Other (See Comments)    unknown  . Latex Other (See Comments)    Rash and itching   Outpatient Prescriptions Prior to Visit    Medication Sig Dispense Refill  . atorvastatin (LIPITOR) 40 MG tablet Take 40 mg by mouth daily.    Marland Kitchen docusate sodium (COLACE) 100 MG capsule Take 100 mg by mouth. 2 in am, 2 in evening    . DULoxetine (CYMBALTA) 60 MG capsule One twice daily to help neuropathy 60 capsule 5  . gabapentin (NEURONTIN) 800 MG tablet Take 1-2 tablets (800-1,600 mg total) by mouth See admin instructions. 2 by mouth every morning, 1 at lunch, and 2 at bedtime. From pain clinic only 30 tablet 0  . glucose blood (ACCU-CHEK AVIVA PLUS) test strip 1 each by Other route See admin instructions. Check blood sugar twice daily    . Insulin Glargine (LANTUS SOLOSTAR) 100 UNIT/ML Solostar Pen Inject 18 Units into the skin daily. 1 pen 1  . Insulin Pen Needle (NOVOFINE) 30G X 8 MM MISC Inject 1 packet into the skin See admin instructions. Use with insulin once daily.    Marland Kitchen lisinopril (PRINIVIL,ZESTRIL) 20 MG tablet One daily to control BP and strengthen the heart 30 tablet 3  . metFORMIN (GLUCOPHAGE) 1000 MG tablet Take 1,000 mg by mouth 2 (two) times daily with a meal.    . methadone (DOLOPHINE) 10 MG tablet Take 1 tablet (10 mg total) by mouth 3 (three) times daily. 30 tablet 0  . metoprolol (LOPRESSOR) 50 MG tablet Take 1 tablet (50 mg total) by mouth 2 (two) times daily. 60 tablet 6  . Multiple Vitamin (MULITIVITAMIN WITH MINERALS) TABS Take 1 tablet  by mouth daily.    . pantoprazole (PROTONIX) 40 MG tablet TAKE 1 TABLET TWO TIMES DAILY. 60 tablet 2  . potassium chloride SA (K-DUR,KLOR-CON) 20 MEQ tablet Take 20 mEq by mouth daily.    . psyllium (METAMUCIL) 58.6 % packet Take 1 packet by mouth daily.    Marland Kitchen spironolactone (ALDACTONE) 50 MG tablet Take 50 mg by mouth daily.    Marland Kitchen torsemide (DEMADEX) 100 MG tablet One twice daily for diuretic 60 tablet 5  . traZODone (DESYREL) 150 MG tablet Take 1 tablet (150 mg total) by mouth at bedtime. 2 by mouth at bedtime 30 tablet 0  . fluconazole (DIFLUCAN) 150 MG tablet Take 1 tablet (150  mg total) by mouth once. 3 tablet 0  . nitroGLYCERIN (NITROSTAT) 0.4 MG SL tablet Place 0.4 mg under the tongue every 5 (five) minutes as needed. For chest pain    . nystatin-triamcinolone ointment (MYCOLOG) Apply 1 application topically 2 (two) times daily. 60 g 1   No facility-administered medications prior to visit.   Past Medical History  Diagnosis Date  . CHF (congestive heart failure)   . Hypertension   . Anemia   . Sleep apnea, obstructive     does where bipap  . Angina   . Shortness of breath   . Anxiety   . Blood transfusion   . Headache(784.0)   . Pneumonia   . Depression   . Hepatitis B   . Neuromuscular disorder   . Arthritis   . Diabetes mellitus 09/18/2011  . Atrial fibrillation with RVR 01/24/2013  . Renal insufficiency   . DJD (degenerative joint disease)   . History of alcohol abuse     last drink in 1993   Past Surgical History  Procedure Laterality Date  . Back surgery      x 8   . Gastric bypass  1977    Reversed in 1992 and revision 1994  . Diagnostic laparoscopy    . Esophagoscopy N/A 01/01/2013    Procedure: ESOPHAGOSCOPY;  Surgeon: Jeryl Columbia, MD;  Location: [facility];  Service: Endoscopy;  Laterality: N/A;  . Esophagogastroduodenoscopy N/A 01/03/2013    Procedure: ESOPHAGOGASTRODUODENOSCOPY (EGD);  Surgeon: Arta Silence, MD;  Location: [facility];  Service: Endoscopy;  Laterality: N/A;  . Esophagogastroduodenoscopy N/A 01/03/2013    Procedure: ESOPHAGOGASTRODUODENOSCOPY (EGD);  Surgeon: Arta Silence, MD;  Location: [facility];  Service: Endoscopy;  Laterality: N/A;  . Esophagogastroduodenoscopy N/A 01/23/2013    Procedure: ESOPHAGOGASTRODUODENOSCOPY (EGD);  Surgeon: Lear Ng, MD;  Location: [facility];  Service: Endoscopy;  Laterality: N/A;  . Esophagogastroduodenoscopy Left 01/27/2013    Procedure: ESOPHAGOGASTRODUODENOSCOPY (EGD);  Surgeon: Lear Ng, MD;  Location: [facility];  Service: Endoscopy;  Laterality: Left;  . Knee  surgery Left 2004  . Hydrocele excision  1996    x 2   . Abdominal surgery      [phone]   History   Social History  . Marital Status: Single    Spouse Name: N/A    Number of Children: 6  . Years of Education: N/A   Occupational History  . DISABLED    Social History Main Topics  . Smoking status: Never Smoker   . Smokeless tobacco: None  . Alcohol Use: No  . Drug Use: No  . Sexual Activity: Not Currently   Other Topics Concern  . None   Social History Narrative   Patient is divorced, he has a total of 6 children 2 of which are  deceased. He is a retired Conservation officer, historic buildings. History of alcoholism. No alcohol now. 2 caffeinated beverages daily. He moved to [place] in last 1-2 years.   Family History  Problem Relation Age of Onset  . Heart disease Father   . Skin cancer Father   . COPD Father   . Hypertension Father   . Heart disease Maternal Uncle   . High blood pressure Sister   . High blood pressure Sister   . High blood pressure Son   . Diabetes Daughter   . Depression Son   . Depression Son   . Stroke Father   . Arthritis Mother   . Arthritis Sister   . Arthritis Sister   . Osteoporosis Mother   . Cancer Maternal Grandmother   . Heart disease Maternal Uncle   . Heart disease Paternal Uncle         Review of Systems Somewhat diffusely positive includes pedal edema, depression symptoms, anxiety, chronic back pain, headaches, myalgia, sore throat urinary frequency and occasional leakage. Allergies and sinus symptoms as well. All other review of systems are negative or as per history of present illness    Objective:   Physical Exam General:  Well-developed, well-nourished and in no acute distress - morbidly obese Eyes:  anicteric. ENT:   Mouth and posterior pharynx free of lesions.  Neck:   supple w/o thyromegaly or mass.  Lungs: Clear to auscultation bilaterally. Heart:  S1S2, no rubs, murmurs, gallops. Abdomen:  obese, soft, mildly tender  epigastrium, no hepatosplenomegaly, hernia, or mass and BS+. Numerous scars from prior surgeries Lymph:  no cervical or supraclavicular adenopathy. Extremities:   2 + edema with erythema Skin  + intertrigo in groin Neuro:  A&O x 3.  Psych:  appropriate mood and  Affect.   Data Reviewed: Hospital notes (dc, consults, endoscopies with photos), UGI, CT including images (2014) PCP notes Labs  All of these results were reviewed in EMR    Assessment & Plan:  Constipation due to opioid therapy - Plan: lubiprostone (AMITIZA) 24 MCG capsule,   Abdominal pain, chronic, epigastric -   Fatty liver - Plan: Hepatic function panel, Hepatitis B core antibody, total, Hepatitis B surface antibody, Hepatitis C antibody, Hepatitis B surface antigen  Abnormal transaminases - Plan: Hepatic function panel, Hepatitis B core antibody, total, Hepatitis B surface antibody, Hepatitis C antibody, Hepatitis B surface antigen  Morbid obesity  History of gastric bypass  Very complicated man with multiple chronic co-morbidities. Main c/o center around bloating, constipation and incomplete defecation. He is at higher risk of any endoscopy/sedation but that should be possible if needed.  1. Labs aas written above 2. Amitiza 24 ug bid for constipation and bloating and is to see if he can reduce MiraLax, Colace 3. Continue PPI for marginal ulcer hx - may need to go back to Dr. Hassell Done for f/u to see if any surgical needs. It appears that his bypass was reveresd by the xray results so do not need to be concerned about complications of gastro-jejunal bypass which is what I bet he had at first. 4. Review last colonoscopy ([facility]) 5. RTC 2 months  YB:OFBPZ, Viviann Spare, MD

## 2014-02-28 NOTE — Patient Instructions (Addendum)
Your physician has requested that you go to the basement for  lab work before leaving today.  We have sent the following medications to your pharmacy for you to pick up at your convenience: Amitiza   Try and cut back on your use of the Miralax after starting the Amitiza.   Follow up with Dr. Carlean Purl in 2 months, February 11th at 1:30pm  We will obtain your records from Dr. Chucky May for Dr Carlean Purl to review.  I appreciate the opportunity to care for you.

## 2014-02-28 NOTE — Progress Notes (Signed)
Patient ID: Phillip Maldonado, male   DOB: [birth year], 63 y.o.   MRN: [id number]    Facility  [facility]    Place of Service:   OFFICE   Allergies  Allergen Reactions  . Adhesive [Tape] Other (See Comments)    unknown  . Feldene [Piroxicam] Other (See Comments)    unknown  . Latex Other (See Comments)    Rash and itching    Chief Complaint  Patient presents with  . Medical Management of Chronic Issues    rash under breast and behind knees    HPI:  Anasarca - patient's lost 9 pounds since his visit 1 week ago. He is feeling less tight in his legs and abdomen. He thinks he is breathing easier.  Essential hypertension - controlled  DM type 2, uncontrolled, with neuropathy - controlled  UGIB (upper gastrointestinal bleed): Has appointment to see Dr. Katha Cabal regarding upper GI bleeding earlier this year.    Medications: Patient's Medications  New Prescriptions   No medications on file  Previous Medications   ATORVASTATIN (LIPITOR) 40 MG TABLET    Take 40 mg by mouth daily.   DOCUSATE SODIUM (COLACE) 100 MG CAPSULE    Take 100 mg by mouth. 2 in am, 2 in evening   DULOXETINE (CYMBALTA) 60 MG CAPSULE    One twice daily to help neuropathy   GABAPENTIN (NEURONTIN) 800 MG TABLET    Take 1-2 tablets (800-1,600 mg total) by mouth See admin instructions. 2 by mouth every morning, 1 at lunch, and 2 at bedtime. From pain clinic only   GLUCOSE BLOOD (ACCU-CHEK AVIVA PLUS) TEST STRIP    1 each by Other route See admin instructions. Check blood sugar twice daily   INSULIN GLARGINE (LANTUS SOLOSTAR) 100 UNIT/ML SOLOSTAR PEN    Inject 18 Units into the skin daily.   INSULIN PEN NEEDLE (NOVOFINE) 30G X 8 MM MISC    Inject 1 packet into the skin See admin instructions. Use with insulin once daily.   LISINOPRIL (PRINIVIL,ZESTRIL) 20 MG TABLET    One daily to control BP and strengthen the heart   METFORMIN (GLUCOPHAGE) 1000 MG TABLET    Take 1,000 mg by mouth 2 (two) times daily with a meal.   METHADONE (DOLOPHINE) 10 MG TABLET    Take 1 tablet (10 mg total) by mouth 3 (three) times daily.   METOPROLOL (LOPRESSOR) 50 MG TABLET    Take 1 tablet (50 mg total) by mouth 2 (two) times daily.   MULTIPLE VITAMIN (MULITIVITAMIN WITH MINERALS) TABS    Take 1 tablet by mouth daily.   PANTOPRAZOLE (PROTONIX) 40 MG TABLET    TAKE 1 TABLET TWO TIMES DAILY.   POTASSIUM CHLORIDE SA (K-DUR,KLOR-CON) 20 MEQ TABLET    Take 20 mEq by mouth daily.   PSYLLIUM (METAMUCIL) 58.6 % PACKET    Take 1 packet by mouth daily.   SPIRONOLACTONE (ALDACTONE) 50 MG TABLET    Take 50 mg by mouth daily.   TORSEMIDE (DEMADEX) 100 MG TABLET    One twice daily for diuretic   TRAZODONE (DESYREL) 150 MG TABLET    Take 1 tablet (150 mg total) by mouth at bedtime. 2 by mouth at bedtime  Modified Medications   No medications on file  Discontinued Medications   AMLODIPINE (NORVASC) 10 MG TABLET    Take 10 mg by mouth daily.   FLUCONAZOLE (DIFLUCAN) 150 MG TABLET    Take 1 tablet (150 mg total) by mouth once.  NITROGLYCERIN (NITROSTAT) 0.4 MG SL TABLET    Place 0.4 mg under the tongue every 5 (five) minutes as needed. For chest pain   NYSTATIN-TRIAMCINOLONE OINTMENT (MYCOLOG)    Apply 1 application topically 2 (two) times daily.     Review of Systems  Constitutional: Negative for fever, activity change, appetite change, fatigue and unexpected weight change.       Morbidly obese history  HENT: Negative for congestion, ear pain, hearing loss, rhinorrhea, sore throat, tinnitus, trouble swallowing and voice change.   Eyes:       Corrective lenses  Respiratory: Negative for cough, choking, chest tightness, shortness of breath and wheezing.   Cardiovascular: Negative for chest pain, palpitations and leg swelling.  Gastrointestinal: Negative for nausea, abdominal pain, diarrhea, constipation and abdominal distention.  Endocrine: Negative for cold intolerance, heat intolerance, polydipsia, polyphagia and polyuria.  Genitourinary:  Negative for dysuria, urgency, frequency and testicular pain.       Not incontinent  Musculoskeletal: Negative for myalgias, back pain, arthralgias, gait problem and neck pain.  Skin: Negative for color change, pallor and rash.       erythema of the anterior shins  Allergic/Immunologic: Negative.   Neurological: Negative for dizziness, tremors, syncope, speech difficulty, weakness, numbness and headaches.       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  Hematological: Negative for adenopathy. Does not bruise/bleed easily.  Psychiatric/Behavioral: Negative for hallucinations, behavioral problems, confusion, sleep disturbance and decreased concentration. The patient is not nervous/anxious.     Filed Vitals:   02/27/14 1625  BP: 142/88  Pulse: 93  Temp: 98 F (36.7 C)  TempSrc: Oral  Resp: 22  Height: '5\' 10"'  (1.778 m)  Weight: 408 lb 6.4 oz (185.249 kg)  SpO2: 94%   Body mass index is 58.6 kg/(m^2).  Physical Exam  Constitutional: He is oriented to person, place, and time. He appears well-developed and well-nourished. No distress.  Morbid obesity  HENT:  Right Ear: External ear normal.  Left Ear: External ear normal.  Nose: Nose normal.  Mouth/Throat: Oropharynx is clear and moist. No oropharyngeal exudate.  Eyes: Conjunctivae and EOM are normal. Pupils are equal, round, and reactive to light.  Neck: No JVD present. No tracheal deviation present. No thyromegaly present.  Cardiovascular: Normal rate, regular rhythm, normal heart sounds and intact distal pulses.  Exam reveals no gallop and no friction rub.   No murmur heard. Pulmonary/Chest: No respiratory distress. He has no wheezes. He has no rales. He exhibits no tenderness.  Abdominal: He exhibits no distension and no mass. There is no tenderness.  Anasarca with intra-abdominal fluid.  Musculoskeletal: Normal range of motion. He exhibits no edema  or tenderness.  Lymphadenopathy:    He has no cervical adenopathy.  Neurological: He is alert and oriented to person, place, and time. He has normal reflexes. No cranial nerve deficit. Coordination normal.  Skin: No rash noted. There is erythema (both shins). No pallor.  Psychiatric: He has a normal mood and affect. His behavior is normal. Judgment and thought content normal.     Labs reviewed: Office Visit on 12/26/2013  Component Date Value Ref Range Status  . Glucose 12/26/2013 212* 65 - 99 mg/dL Final  . BUN 12/26/2013 15  8 - 27 mg/dL Final  . Creatinine, Ser 12/26/2013 1.05  0.76 - 1.27 mg/dL Final  . GFR calc non Af Amer 12/26/2013 76  >59 mL/min/1.73 Final  .  GFR calc Af Amer 12/26/2013 88  >59 mL/min/1.73 Final  . BUN/Creatinine Ratio 12/26/2013 14  10 - 22 Final  . Sodium 12/26/2013 137  134 - 144 mmol/L Final  . Potassium 12/26/2013 4.4  3.5 - 5.2 mmol/L Final  . Chloride 12/26/2013 92* 97 - 108 mmol/L Final  . CO2 12/26/2013 26  18 - 29 mmol/L Final  . Calcium 12/26/2013 9.6  8.6 - 10.2 mg/dL Final  . Total Protein 12/26/2013 6.8  6.0 - 8.5 g/dL Final  . Albumin 12/26/2013 4.0  3.6 - 4.8 g/dL Final  . Globulin, Total 12/26/2013 2.8  1.5 - 4.5 g/dL Final  . Albumin/Globulin Ratio 12/26/2013 1.4  1.1 - 2.5 Final  . Total Bilirubin 12/26/2013 0.3  0.0 - 1.2 mg/dL Final  . Alkaline Phosphatase 12/26/2013 58  39 - 117 IU/L Final  . AST 12/26/2013 50* 0 - 40 IU/L Final  . ALT 12/26/2013 39  0 - 44 IU/L Final  . Cholesterol, Total 12/26/2013 153  100 - 199 mg/dL Final   Comment: **Effective January 08, 2014 the reference interval**                            for Cholesterol, Total will be changing to:                                                   0 - 19 years       100 - 169                                                      >19 years       100 - 199  . Triglycerides 12/26/2013 206* 0 - 149 mg/dL Final   Comment: **Effective January 08, 2014 the reference interval**                             for Triglycerides will be changing to:                                                   0 -  9 years         0 -  74                                                  10 - 19 years         0 -  89                                                      >19 years         0 -  149  . HDL 12/26/2013 45  >39 mg/dL Final   Comment: According to ATP-III Guidelines, HDL-C >59 mg/dL is considered a                          negative risk factor for CHD.  Marland Kitchen VLDL Cholesterol Cal 12/26/2013 41* 5 - 40 mg/dL Final  . LDL Calculated 12/26/2013 67  0 - 99 mg/dL Final   Comment: **Effective January 08, 2014 the reference interval**                            for LDL Cholesterol Calc will be changing to:                                                   0 - 19 years         0 - 109                                                      >19 years         0 - 99  . Chol/HDL Ratio 12/26/2013 3.4  0.0 - 5.0 ratio units Final   Comment:                                   T. Chol/HDL Ratio                                                                      Men  Women                                                        1/2 Avg.Risk  3.4    3.3                                                            Avg.Risk  5.0    4.4                                                         2X Avg.Risk  9.6    7.1  3X Avg.Risk 23.4   11.0  . WBC 12/26/2013 7.0  3.4 - 10.8 x10E3/uL Final  . RBC 12/26/2013 4.63  4.14 - 5.80 x10E6/uL Final  . Hemoglobin 12/26/2013 11.0* 12.6 - 17.7 g/dL Final  . HCT 12/26/2013 35.7* 37.5 - 51.0 % Final  . MCV 12/26/2013 77* 79 - 97 fL Final  . MCH 12/26/2013 23.8* 26.6 - 33.0 pg Final  . MCHC 12/26/2013 30.8* 31.5 - 35.7 g/dL Final  . RDW 12/26/2013 19.3* 12.3 - 15.4 % Final  . Neutrophils Relative % 12/26/2013 67   Final  . Lymphs 12/26/2013 21   Final  . Monocytes 12/26/2013 8   Final  . Eos 12/26/2013 3   Final    . Basos 12/26/2013 1   Final  . Neutrophils Absolute 12/26/2013 4.8  1.4 - 7.0 x10E3/uL Final  . Lymphocytes Absolute 12/26/2013 1.5  0.7 - 3.1 x10E3/uL Final  . Monocytes Absolute 12/26/2013 0.5  0.1 - 0.9 x10E3/uL Final  . Eosinophils Absolute 12/26/2013 0.2  0.0 - 0.4 x10E3/uL Final  . Basophils Absolute 12/26/2013 0.0  0.0 - 0.2 x10E3/uL Final  . Immature Granulocytes 12/26/2013 0   Final  . Immature Grans (Abs) 12/26/2013 0.0  0.0 - 0.1 x10E3/uL Final  . TSH 12/26/2013 1.470  0.450 - 4.500 uIU/mL Final  . Hgb A1c MFr Bld 12/26/2013 10.0* 4.8 - 5.6 % Final   Comment:          Increased risk for diabetes: 5.7 - 6.4                                   Diabetes: >6.4                                   Glycemic control for adults with diabetes: <7.0  . Est. average glucose Bld gHb Est-m* 12/26/2013 240   Final  . Creatinine, Ur 12/26/2013 137.3  22.0 - 328.0 mg/dL Final  . Microalbum.,U,Random 12/26/2013 9.9  0.0 - 17.0 ug/mL Final  . MICROALB/CREAT RATIO 12/26/2013 7.2  0.0 - 30.0 mg/g creat Final  Admission on 12/07/2013, Discharged on 12/07/2013  Component Date Value Ref Range Status  . WBC 12/07/2013 7.9  4.0 - 10.5 K/uL Final  . RBC 12/07/2013 4.83  4.22 - 5.81 MIL/uL Final  . Hemoglobin 12/07/2013 11.2* 13.0 - 17.0 g/dL Final  . HCT 12/07/2013 37.0* 39.0 - 52.0 % Final  . MCV 12/07/2013 76.6* 78.0 - 100.0 fL Final  . MCH 12/07/2013 23.2* 26.0 - 34.0 pg Final  . MCHC 12/07/2013 30.3  30.0 - 36.0 g/dL Final  . RDW 12/07/2013 18.7* 11.5 - 15.5 % Final  . Platelets 12/07/2013 258  150 - 400 K/uL Final  . Sodium 12/07/2013 139  137 - 147 mEq/L Final  . Potassium 12/07/2013 4.5  3.7 - 5.3 mEq/L Final  . Chloride 12/07/2013 94* 96 - 112 mEq/L Final  . CO2 12/07/2013 27  19 - 32 mEq/L Final  . Glucose, Bld 12/07/2013 201* 70 - 99 mg/dL Final  . BUN 12/07/2013 18  6 - 23 mg/dL Final  . Creatinine, Ser 12/07/2013 0.89  0.50 - 1.35 mg/dL Final  . Calcium 12/07/2013 9.3  8.4 - 10.5 mg/dL  Final  . GFR calc non Af Amer 12/07/2013 90* >90 mL/min Final  . GFR calc Af Amer 12/07/2013 >90  >  90 mL/min Final   Comment: (NOTE)                          The eGFR has been calculated using the CKD EPI equation.                          This calculation has not been validated in all clinical situations.                          eGFR's persistently <90 mL/min signify possible Chronic Kidney                          Disease.  . Anion gap 12/07/2013 18* 5 - 15 Final  . Troponin i, poc 12/07/2013 0.00  0.00 - 0.08 ng/mL Final  . Comment 3 12/07/2013          Final   Comment: Due to the release kinetics of cTnI,                          a negative result within the first hours                          of the onset of symptoms does not rule out                          myocardial infarction with certainty.                          If myocardial infarction is still suspected,                          repeat the test at appropriate intervals.  . Pro B Natriuretic peptide (BNP) 12/07/2013 61.8  0 - 125 pg/mL Final     Assessment/Plan 1. Anasarca Continue current medications. We hope for continued progressive weight loss. Patient is caring I would think 20-25 pounds of excess fluid at this time. He will return to see Dr. Bubba Camp 03/20/13 and then will see me in about 6-8 weeks. - Basic metabolic panel; Future  2. Essential hypertension Controlled - Basic metabolic panel; Future  3. DM type 2, uncontrolled, with neuropathy Recheck lab next trip - Basic metabolic panel; Future  4. UGIB (upper gastrointestinal bleed) See Dr. Carlean Purl as planned.

## 2014-03-02 NOTE — Progress Notes (Signed)
Quick Note:  Labs ok No infectious hepatitis Mild AST elevation noted and will follow - probably fatty liver ______

## 2014-03-14 ENCOUNTER — Other Ambulatory Visit: Payer: Medicare Other

## 2014-03-14 DIAGNOSIS — E1165 Type 2 diabetes mellitus with hyperglycemia: Principal | ICD-10-CM

## 2014-03-14 DIAGNOSIS — E1140 Type 2 diabetes mellitus with diabetic neuropathy, unspecified: Secondary | ICD-10-CM

## 2014-03-14 DIAGNOSIS — IMO0002 Reserved for concepts with insufficient information to code with codable children: Secondary | ICD-10-CM

## 2014-03-14 DIAGNOSIS — R601 Generalized edema: Secondary | ICD-10-CM

## 2014-03-15 LAB — TSH: TSH: 2.53 u[IU]/mL (ref 0.450–4.500)

## 2014-03-15 LAB — COMPREHENSIVE METABOLIC PANEL
ALBUMIN: 4.3 g/dL (ref 3.6–4.8)
ALT: 37 IU/L (ref 0–44)
AST: 64 IU/L — ABNORMAL HIGH (ref 0–40)
Albumin/Globulin Ratio: 1.3 (ref 1.1–2.5)
Alkaline Phosphatase: 62 IU/L (ref 39–117)
BILIRUBIN TOTAL: 0.4 mg/dL (ref 0.0–1.2)
BUN/Creatinine Ratio: 17 (ref 10–22)
BUN: 26 mg/dL (ref 8–27)
CHLORIDE: 90 mmol/L — AB (ref 97–108)
CO2: 24 mmol/L (ref 18–29)
CREATININE: 1.51 mg/dL — AB (ref 0.76–1.27)
Calcium: 9.8 mg/dL (ref 8.6–10.2)
GFR calc non Af Amer: 48 mL/min/{1.73_m2} — ABNORMAL LOW (ref >59)
GFR, EST AFRICAN AMERICAN: 56 mL/min/{1.73_m2} — AB (ref >59)
Globulin, Total: 3.2 g/dL (ref 1.5–4.5)
Glucose: 223 mg/dL — ABNORMAL HIGH (ref 65–99)
Potassium: 5.3 mmol/L — ABNORMAL HIGH (ref 3.5–5.2)
Sodium: 137 mmol/L (ref 134–144)
TOTAL PROTEIN: 7.5 g/dL (ref 6.0–8.5)

## 2014-03-20 ENCOUNTER — Encounter: Payer: Self-pay | Admitting: Internal Medicine

## 2014-03-20 ENCOUNTER — Ambulatory Visit: Payer: Medicare Other | Admitting: Internal Medicine

## 2014-03-20 DIAGNOSIS — Z0289 Encounter for other administrative examinations: Secondary | ICD-10-CM

## 2014-04-11 ENCOUNTER — Encounter: Payer: Self-pay | Admitting: Internal Medicine

## 2014-04-11 ENCOUNTER — Ambulatory Visit (INDEPENDENT_AMBULATORY_CARE_PROVIDER_SITE_OTHER): Payer: Medicare Other | Admitting: Internal Medicine

## 2014-04-11 VITALS — BP 140/72 | HR 74 | Temp 98.3°F | Resp 12 | Ht 70.0 in | Wt 341.0 lb

## 2014-04-11 DIAGNOSIS — I10 Essential (primary) hypertension: Secondary | ICD-10-CM

## 2014-04-11 DIAGNOSIS — E1165 Type 2 diabetes mellitus with hyperglycemia: Secondary | ICD-10-CM

## 2014-04-11 DIAGNOSIS — D519 Vitamin B12 deficiency anemia, unspecified: Secondary | ICD-10-CM | POA: Insufficient documentation

## 2014-04-11 DIAGNOSIS — E1140 Type 2 diabetes mellitus with diabetic neuropathy, unspecified: Secondary | ICD-10-CM

## 2014-04-11 DIAGNOSIS — R21 Rash and other nonspecific skin eruption: Secondary | ICD-10-CM

## 2014-04-11 DIAGNOSIS — L304 Erythema intertrigo: Secondary | ICD-10-CM

## 2014-04-11 DIAGNOSIS — IMO0002 Reserved for concepts with insufficient information to code with codable children: Secondary | ICD-10-CM

## 2014-04-11 DIAGNOSIS — E538 Deficiency of other specified B group vitamins: Secondary | ICD-10-CM

## 2014-04-11 DIAGNOSIS — E876 Hypokalemia: Secondary | ICD-10-CM

## 2014-04-11 DIAGNOSIS — R601 Generalized edema: Secondary | ICD-10-CM

## 2014-04-11 MED ORDER — NYSTATIN-TRIAMCINOLONE 100000-0.1 UNIT/GM-% EX CREA: TOPICAL_CREAM | CUTANEOUS | Status: DC

## 2014-04-11 NOTE — Progress Notes (Signed)
Patient ID: Phillip Maldonado, male   DOB: 12/28/1950, 64 y.o.   MRN: [id number]    Facility  [facility]    Place of Service:   OFFICE   Allergies  Allergen Reactions  . Adhesive [Tape] Other (See Comments)    unknown  . Feldene [Piroxicam] Other (See Comments)    unknown  . Latex Other (See Comments)    Rash and itching    Chief Complaint  Patient presents with  . Acute Visit    Sores, redness, and pain on both legs. Patient was geting legs wrapped twice weekly by [facility], order ran out and was never renewed. Patient tried OTC lotion   . Rash    Rash on face and neck off/on x 6 months ago     HPI:  Facial rash for about 8 months. Has tried lotions with and without aloe. Burns at times.  Lost 60# weight. Edema is much better.   Chronic stasis dermatitis of the legs.  Hands burn and tingle.  Medications: Patient's Medications  New Prescriptions   No medications on file  Previous Medications   ATORVASTATIN (LIPITOR) 40 MG TABLET    Take 40 mg by mouth daily.   DOCUSATE SODIUM (COLACE) 100 MG CAPSULE    Take 100 mg by mouth. 2 in am, 2 in evening   DULOXETINE (CYMBALTA) 60 MG CAPSULE    One twice daily to help neuropathy   GABAPENTIN (NEURONTIN) 800 MG TABLET    Take 1-2 tablets (800-1,600 mg total) by mouth See admin instructions. 2 by mouth every morning, 1 at lunch, and 2 at bedtime. From pain clinic only   GLUCOSE BLOOD (ACCU-CHEK AVIVA PLUS) TEST STRIP    1 each by Other route See admin instructions. Check blood sugar twice daily   INSULIN GLARGINE (LANTUS SOLOSTAR) 100 UNIT/ML SOLOSTAR PEN    Inject 18 Units into the skin daily.   INSULIN PEN NEEDLE (NOVOFINE) 30G X 8 MM MISC    Inject 1 packet into the skin See admin instructions. Use with insulin once daily.   LISINOPRIL (PRINIVIL,ZESTRIL) 20 MG TABLET    One daily to control BP and strengthen the heart   LUBIPROSTONE (AMITIZA) 24 MCG CAPSULE    Take 1 capsule (24 mcg total) by mouth 2 (two) times daily with a meal.   METFORMIN (GLUCOPHAGE) 1000 MG TABLET    Take 1,000 mg by mouth 2 (two) times daily with a meal.   METHADONE (DOLOPHINE) 10 MG TABLET    Take 1 tablet (10 mg total) by mouth 3 (three) times daily.   METOPROLOL (LOPRESSOR) 50 MG TABLET    Take 1 tablet (50 mg total) by mouth 2 (two) times daily.   MULTIPLE VITAMIN (MULITIVITAMIN WITH MINERALS) TABS    Take 1 tablet by mouth daily.   PANTOPRAZOLE (PROTONIX) 40 MG TABLET    TAKE 1 TABLET TWO TIMES DAILY.   POLYETHYLENE GLYCOL (MIRALAX / GLYCOLAX) PACKET    Take 17 g by mouth 2 (two) times daily.   POTASSIUM CHLORIDE SA (K-DUR,KLOR-CON) 20 MEQ TABLET    Take 20 mEq by mouth daily.   PSYLLIUM (METAMUCIL) 58.6 % PACKET    Take 1 packet by mouth daily.   SPIRONOLACTONE (ALDACTONE) 50 MG TABLET    Take 50 mg by mouth daily.   TORSEMIDE (DEMADEX) 100 MG TABLET    One twice daily for diuretic   TRAZODONE (DESYREL) 150 MG TABLET    Take 1 tablet (150 mg total) by mouth  at bedtime. 2 by mouth at bedtime  Modified Medications   No medications on file  Discontinued Medications   No medications on file     Review of Systems  Constitutional: Negative for fever, activity change, appetite change, fatigue and unexpected weight change.       Morbidly obese  HENT: Negative for congestion, ear pain, hearing loss, rhinorrhea, sore throat, tinnitus, trouble swallowing and voice change.   Eyes:       Corrective lenses  Respiratory: Negative for cough, choking, chest tightness, shortness of breath and wheezing.   Cardiovascular: Negative for chest pain and palpitations.  Gastrointestinal: Negative for nausea, abdominal pain, diarrhea, constipation and abdominal distention.  Endocrine: Negative for cold intolerance, heat intolerance, polydipsia, polyphagia and polyuria.  Genitourinary: Negative for dysuria, urgency, frequency and testicular pain.       Not incontinent  Musculoskeletal: Negative for myalgias, back pain, arthralgias, gait problem and neck pain.    Skin: Positive for rash. Negative for color change and pallor.       erythema of the anterior shins. Patient has intertrigo under the breasts and in the groin area bilaterally. There is some burning discomfort. Blotchy, red facial rash present about 8 months.  Allergic/Immunologic: Negative.   Neurological: Negative for dizziness, tremors, syncope, speech difficulty, weakness, numbness and headaches.       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  Hematological: Negative for adenopathy. Does not bruise/bleed easily.  Psychiatric/Behavioral: Negative for hallucinations, behavioral problems, confusion, sleep disturbance and decreased concentration. The patient is not nervous/anxious.     Filed Vitals:   04/11/14 1544  BP: 140/72  Pulse: 74  Temp: 98.3 F (36.8 C)  TempSrc: Oral  Resp: 12  Height: 5\' 10"  (1.778 m)  Weight: 341 lb (154.677 kg)  SpO2: 92%   Body mass index is 48.93 kg/(m^2).  Physical Exam  Constitutional: He is oriented to person, place, and time. He appears well-developed and well-nourished. No distress.  Morbid obesity  HENT:  Right Ear: External ear normal.  Left Ear: External ear normal.  Nose: Nose normal.  Mouth/Throat: Oropharynx is clear and moist. No oropharyngeal exudate.  Eyes: Conjunctivae and EOM are normal. Pupils are equal, round, and reactive to light.  Neck: No JVD present. No tracheal deviation present. No thyromegaly present.  Cardiovascular: Normal rate, regular rhythm, normal heart sounds and intact distal pulses.  Exam reveals no gallop and no friction rub.   No murmur heard. Pulmonary/Chest: No respiratory distress. He has no wheezes. He has no rales. He exhibits no tenderness.  Abdominal: He exhibits no distension and no mass. There is no tenderness.  Anasarca with intra-abdominal fluid.  Musculoskeletal: He exhibits edema. He exhibits no tenderness.  Using  cane. Unstable gait.  Lymphadenopathy:    He has no cervical adenopathy.  Neurological: He is alert and oriented to person, place, and time. He has normal reflexes. No cranial nerve deficit. Coordination normal.  Skin: Rash noted. There is erythema (both shins). No pallor.  Chronic venous stasis changes of both anterior legs. Erythematous intertrigo under the breasts and in the groin area bilaterally. Diffuse, blotchy facial rash. Blotches are about the size of the end of my finger. It does not seem to involve the scalp, but does cover the forehead and cheeks, chin, and anterior neck.   Psychiatric: He has a normal mood and affect. His behavior is normal. Judgment and thought content normal.  Labs reviewed: Appointment on 03/14/2014  Component Date Value Ref Range Status  . Glucose 03/14/2014 223* 65 - 99 mg/dL Final  . BUN 03/14/2014 26  8 - 27 mg/dL Final  . Creatinine, Ser 03/14/2014 1.51* 0.76 - 1.27 mg/dL Final  . GFR calc non Af Amer 03/14/2014 48* >59 mL/min/1.73 Final  . GFR calc Af Amer 03/14/2014 56* >59 mL/min/1.73 Final  . BUN/Creatinine Ratio 03/14/2014 17  10 - 22 Final  . Sodium 03/14/2014 137  134 - 144 mmol/L Final  . Potassium 03/14/2014 5.3* 3.5 - 5.2 mmol/L Final  . Chloride 03/14/2014 90* 97 - 108 mmol/L Final  . CO2 03/14/2014 24  18 - 29 mmol/L Final  . Calcium 03/14/2014 9.8  8.6 - 10.2 mg/dL Final  . Total Protein 03/14/2014 7.5  6.0 - 8.5 g/dL Final  . Albumin 03/14/2014 4.3  3.6 - 4.8 g/dL Final  . Globulin, Total 03/14/2014 3.2  1.5 - 4.5 g/dL Final  . Albumin/Globulin Ratio 03/14/2014 1.3  1.1 - 2.5 Final  . Total Bilirubin 03/14/2014 0.4  0.0 - 1.2 mg/dL Final  . Alkaline Phosphatase 03/14/2014 62  39 - 117 IU/L Final  . AST 03/14/2014 64* 0 - 40 IU/L Final  . ALT 03/14/2014 37  0 - 44 IU/L Final  . TSH 03/14/2014 2.530  0.450 - 4.500 uIU/mL Final  Appointment on 02/28/2014  Component Date Value Ref Range Status  . Total Bilirubin 02/28/2014 0.5   0.2 - 1.2 mg/dL Final  . Bilirubin, Direct 02/28/2014 0.1  0.0 - 0.3 mg/dL Final  . Alkaline Phosphatase 02/28/2014 53  39 - 117 U/L Final  . AST 02/28/2014 49* 0 - 37 U/L Final  . ALT 02/28/2014 31  0 - 53 U/L Final  . Total Protein 02/28/2014 7.4  6.0 - 8.3 g/dL Final  . Albumin 02/28/2014 3.7  3.5 - 5.2 g/dL Final  . Hep B Core Total Ab 02/28/2014 NON REACTIVE  NON REACTIVE Final  . Hep B S Ab 02/28/2014 NEG  NEGATIVE Final  . HCV Ab 02/28/2014 NEGATIVE  NEGATIVE Final  . Hepatitis B Surface Ag 02/28/2014 NEGATIVE  NEGATIVE Final     Assessment/Plan 1. Intertrigo - nystatin-triamcinolone (MYCOLOG II) cream; Apply sparingly to rash twice daily  Dispense: 60 g; Refill: 5  2. Anasarca Improved since the last visit. Continue high-dose diuretic. - CMP; Future  3. Essential hypertension Controlled - CMP; Future  4. Hypokalemia Controlled  5. DM type 2, uncontrolled, with neuropathy Controlled - Hemoglobin A1C; Future  6. Cyanocobalamine deficiency (non anemic) His follow-up  7. Rash and nonspecific skin eruption I am skeptical the facial rash is fungal He may try Mycolog cream to the rash to see if there is any improvement. - Ambulatory referral to Dermatology

## 2014-04-16 ENCOUNTER — Other Ambulatory Visit: Payer: Self-pay | Admitting: Internal Medicine

## 2014-04-26 ENCOUNTER — Encounter: Payer: Self-pay | Admitting: Internal Medicine

## 2014-04-26 ENCOUNTER — Ambulatory Visit (INDEPENDENT_AMBULATORY_CARE_PROVIDER_SITE_OTHER): Payer: Medicare Other | Admitting: Internal Medicine

## 2014-04-26 VITALS — BP 140/80 | HR 68 | Ht 67.75 in | Wt >= 6400 oz

## 2014-04-26 DIAGNOSIS — R110 Nausea: Secondary | ICD-10-CM

## 2014-04-26 DIAGNOSIS — T402X5A Adverse effect of other opioids, initial encounter: Secondary | ICD-10-CM

## 2014-04-26 DIAGNOSIS — K289 Gastrojejunal ulcer, unspecified as acute or chronic, without hemorrhage or perforation: Secondary | ICD-10-CM

## 2014-04-26 DIAGNOSIS — Z1211 Encounter for screening for malignant neoplasm of colon: Secondary | ICD-10-CM

## 2014-04-26 DIAGNOSIS — K5903 Drug induced constipation: Secondary | ICD-10-CM

## 2014-04-26 DIAGNOSIS — K5909 Other constipation: Secondary | ICD-10-CM

## 2014-04-26 MED ORDER — LUBIPROSTONE 24 MCG PO CAPS: 24.0000 ug | ORAL_CAPSULE | Freq: Two times a day (BID) | ORAL | Status: DC

## 2014-04-26 NOTE — Progress Notes (Signed)
Subjective:    Patient ID: Phillip Maldonado, male    DOB: 1950/10/19, 64 y.o.   MRN: [id number] Cc: nausea and constipation HPI  Constipation was helped by [place] - wants a refill. Still with frequent nausea. He relates that when he was on Carafate for his marginal ulcer treatment did not have nausea. I was a 1-2 years ago. He is not complaining of abdominal pain today except for occasional pain in the  "knots in his stomach".  Allergies  Allergen Reactions  . Adhesive [Tape] Other (See Comments)    unknown  . Feldene [Piroxicam] Other (See Comments)    unknown  . Latex Other (See Comments)    Rash and itching   Outpatient Prescriptions Prior to Visit  Medication Sig Dispense Refill  . atorvastatin (LIPITOR) 40 MG tablet take 1 tablet once daily 30 tablet 5  . docusate sodium (COLACE) 100 MG capsule Take 100 mg by mouth. 2 in am, 2 in evening    . DULoxetine (CYMBALTA) 60 MG capsule One twice daily to help neuropathy 60 capsule 5  . gabapentin (NEURONTIN) 800 MG tablet Take 1-2 tablets (800-1,600 mg total) by mouth See admin instructions. 2 by mouth every morning, 1 at lunch, and 2 at bedtime. From pain clinic only 30 tablet 0  . glucose blood (ACCU-CHEK AVIVA PLUS) test strip 1 each by Other route See admin instructions. Check blood sugar twice daily    . Insulin Glargine (LANTUS SOLOSTAR) 100 UNIT/ML Solostar Pen Inject 18 Units into the skin daily. 1 pen 1  . Insulin Pen Needle (NOVOFINE) 30G X 8 MM MISC Inject 1 packet into the skin See admin instructions. Use with insulin once daily.    Marland Kitchen lisinopril (PRINIVIL,ZESTRIL) 20 MG tablet One daily to control BP and strengthen the heart 30 tablet 3  . metFORMIN (GLUCOPHAGE) 1000 MG tablet Take 1,000 mg by mouth 2 (two) times daily with a meal.    . methadone (DOLOPHINE) 10 MG tablet Take 1 tablet (10 mg total) by mouth 3 (three) times daily. 30 tablet 0  . metoprolol (LOPRESSOR) 50 MG tablet Take 1 tablet (50 mg total) by mouth 2 (two)  times daily. 60 tablet 6  . Multiple Vitamin (MULITIVITAMIN WITH MINERALS) TABS Take 1 tablet by mouth daily.    Marland Kitchen nystatin-triamcinolone (MYCOLOG II) cream Apply sparingly to rash twice daily 60 g 5  . pantoprazole (PROTONIX) 40 MG tablet TAKE 1 TABLET TWO TIMES DAILY. 60 tablet 2  . potassium chloride SA (K-DUR,KLOR-CON) 20 MEQ tablet Take 20 mEq by mouth daily.    . psyllium (METAMUCIL) 58.6 % packet Take 1 packet by mouth daily.    Marland Kitchen spironolactone (ALDACTONE) 50 MG tablet Take 50 mg by mouth daily.    Marland Kitchen torsemide (DEMADEX) 100 MG tablet One twice daily for diuretic 60 tablet 5  . traZODone (DESYREL) 150 MG tablet Take 1 tablet (150 mg total) by mouth at bedtime. 2 by mouth at bedtime 30 tablet 0  . lubiprostone (AMITIZA) 24 MCG capsule Take 1 capsule (24 mcg total) by mouth 2 (two) times daily with a meal. (Patient not taking: Reported on 04/26/2014) 60 capsule 3  . polyethylene glycol (MIRALAX / GLYCOLAX) packet Take 17 g by mouth 2 (two) times daily. 14 each 0   No facility-administered medications prior to visit.   Past Medical History  Diagnosis Date  . CHF (congestive heart failure)   . Hypertension   . Anemia   . Sleep apnea, obstructive  does where bipap  . Angina   . Shortness of breath   . Anxiety   . Blood transfusion   . Headache(784.0)   . Pneumonia   . Depression   . Hepatitis B   . Neuromuscular disorder   . Arthritis   . Diabetes mellitus 09/18/2011  . Atrial fibrillation with RVR 01/24/2013  . Renal insufficiency   . DJD (degenerative joint disease)   . History of alcohol abuse     last drink in 1993  . Obesity   . Diverticulosis   . Fatty liver    Past Surgical History  Procedure Laterality Date  . Back surgery      x 8   . Gastric bypass  1977    Reversed in 1992 and revision 1994  . Diagnostic laparoscopy    . Esophagoscopy N/A 01/01/2013    Procedure: ESOPHAGOSCOPY;  Surgeon: Jeryl Columbia, MD;  Location: [facility];  Service: Endoscopy;   Laterality: N/A;  . Esophagogastroduodenoscopy N/A 01/03/2013    Procedure: ESOPHAGOGASTRODUODENOSCOPY (EGD);  Surgeon: Arta Silence, MD;  Location: [facility];  Service: Endoscopy;  Laterality: N/A;  . Esophagogastroduodenoscopy N/A 01/03/2013    Procedure: ESOPHAGOGASTRODUODENOSCOPY (EGD);  Surgeon: Arta Silence, MD;  Location: [facility];  Service: Endoscopy;  Laterality: N/A;  . Esophagogastroduodenoscopy N/A 01/23/2013    Procedure: ESOPHAGOGASTRODUODENOSCOPY (EGD);  Surgeon: Lear Ng, MD;  Location: [facility];  Service: Endoscopy;  Laterality: N/A;  . Esophagogastroduodenoscopy Left 01/27/2013    Procedure: ESOPHAGOGASTRODUODENOSCOPY (EGD);  Surgeon: Lear Ng, MD;  Location: [facility];  Service: Endoscopy;  Laterality: Left;  . Knee surgery Left 2004  . Hydrocele excision  1996    x 2   . Abdominal surgery      [phone]  . Colonoscopy     History   Social History  . Marital Status: Single    Spouse Name: N/A  . Number of Children: 6  . Years of Education: N/A   Occupational History  . DISABLED    Social History Main Topics  . Smoking status: Never Smoker   . Smokeless tobacco: Not on file  . Alcohol Use: No  . Drug Use: No  . Sexual Activity: Not Currently   Other Topics Concern  . None   Social History Narrative   Patient is divorced, he has a total of 6 children 2 of which are deceased. He is a retired Conservation officer, historic buildings. History of alcoholism. No alcohol now. 2 caffeinated beverages daily. He moved to [place] in last 1-2 years.   Family History  Problem Relation Age of Onset  . Heart disease Father   . Skin cancer Father   . COPD Father   . Hypertension Father   . Heart disease Maternal Uncle   . High blood pressure Sister   . High blood pressure Sister   . High blood pressure Son   . Diabetes Daughter   . Depression Son   . Depression Son   . Stroke Father   . Arthritis Mother   . Arthritis Sister   . Arthritis Sister     . Osteoporosis Mother   . Cancer Maternal Grandmother   . Heart disease Maternal Uncle   . Heart disease Paternal Uncle    Review of Systems As above    Objective:   Physical Exam BP 140/80 mmHg  Pulse 68  Ht 5' 7.75" (1.721 m)  Wt 413 lb (187.336 kg)  BMI 63.25 kg/m2 Obese, NAD Lungs clear Heart sounds  distant Abd obese NT BS + A and o x 3 Affect appropriate     Assessment & Plan:  Constipation due to opioid therapy - Plan: lubiprostone (AMITIZA) 24 MCG capsule  Chronic nausea  Marginal ulcer  Colon cancer screening  1. Refill Amitiza for the chronic constipation associated with narcotics 2. Schedule colonoscopy for screening purposes.The risks and benefits as well as alternatives of endoscopic procedure(s) have been discussed and reviewed. All questions answered. The patient agrees to proceed. 3. Given his chronic nausea and history of marginal ulcer related to his prior bariatric surgery will perform an upper endoscopy as well.The risks and benefits as well as alternatives of endoscopic procedure(s) have been discussed and reviewed. All questions answered. The patient agrees to proceed.

## 2014-04-26 NOTE — Patient Instructions (Addendum)
You have been scheduled for a colonoscopy and EGD. Please follow written instructions given to you at your visit today.  Please pick up your prep kit at the pharmacy within the next 1-3 days.  Use the coupon for the free moviprep voucher. If you use inhalers (even only as needed), please bring them with you on the day of your procedure.  We have sent the following medications to your pharmacy for you to pick up at your convenience: Amitiza  I appreciate the opportunity to care for you. Silvano Rusk, M.D., [facility]

## 2014-05-09 ENCOUNTER — Ambulatory Visit: Payer: Self-pay | Admitting: Internal Medicine

## 2014-05-11 ENCOUNTER — Other Ambulatory Visit: Payer: Self-pay | Admitting: Internal Medicine

## 2014-05-16 ENCOUNTER — Ambulatory Visit (INDEPENDENT_AMBULATORY_CARE_PROVIDER_SITE_OTHER): Payer: Medicare Other | Admitting: Internal Medicine

## 2014-05-16 ENCOUNTER — Encounter: Payer: Self-pay | Admitting: Internal Medicine

## 2014-05-16 VITALS — BP 138/78 | HR 77 | Temp 97.9°F | Resp 20 | Ht 68.0 in | Wt 398.4 lb

## 2014-05-16 DIAGNOSIS — D519 Vitamin B12 deficiency anemia, unspecified: Secondary | ICD-10-CM | POA: Diagnosis not present

## 2014-05-16 DIAGNOSIS — IMO0002 Reserved for concepts with insufficient information to code with codable children: Secondary | ICD-10-CM

## 2014-05-16 DIAGNOSIS — I10 Essential (primary) hypertension: Secondary | ICD-10-CM | POA: Diagnosis not present

## 2014-05-16 DIAGNOSIS — E1140 Type 2 diabetes mellitus with diabetic neuropathy, unspecified: Secondary | ICD-10-CM

## 2014-05-16 DIAGNOSIS — L304 Erythema intertrigo: Secondary | ICD-10-CM

## 2014-05-16 DIAGNOSIS — R21 Rash and other nonspecific skin eruption: Secondary | ICD-10-CM | POA: Diagnosis not present

## 2014-05-16 DIAGNOSIS — D649 Anemia, unspecified: Secondary | ICD-10-CM

## 2014-05-16 DIAGNOSIS — E1165 Type 2 diabetes mellitus with hyperglycemia: Secondary | ICD-10-CM | POA: Diagnosis not present

## 2014-05-16 DIAGNOSIS — R601 Generalized edema: Secondary | ICD-10-CM

## 2014-05-16 NOTE — Patient Instructions (Signed)
Stop potassium.

## 2014-05-16 NOTE — Progress Notes (Signed)
Patient ID: Phillip Maldonado, male   DOB: 1951/02/19, 64 y.o.   MRN: [id number]    Facility  [facility]    Place of Service:   OFFICE   Allergies  Allergen Reactions  . Adhesive [Tape] Other (See Comments)    unknown  . Feldene [Piroxicam] Other (See Comments)    unknown  . Latex Other (See Comments)    Rash and itching    Chief Complaint  Patient presents with  . Medical Management of Chronic Issues    2 month follow up, constant nausea problems with staying awake    HPI:  DM type 2, uncontrolled, with neuropathy: Continues to work on improvement of his diet. Needs lab work prior to next visit to further evaluate his diabetic control.  Essential hypertension: Controlled  Anasarca: Improving  Intertrigo: Improved with Mycolog-II cream  Rash and nonspecific skin eruption: Facial rash has improved also with Mycolog-II cream  Anemia, unspecified anemia type: Needs further follow-up  B12 deficiency anemia: He is not receiving B12 injections. Has had several procedures in the stomach and a history of B12 deficiency in the past, but has not been checked recently.    Medications: Patient's Medications  New Prescriptions   No medications on file  Previous Medications   ATORVASTATIN (LIPITOR) 40 MG TABLET    take 1 tablet once daily   DOCUSATE SODIUM (COLACE) 100 MG CAPSULE    Take 100 mg by mouth. 2 in am, 2 in evening   DULOXETINE (CYMBALTA) 60 MG CAPSULE    One twice daily to help neuropathy   GABAPENTIN (NEURONTIN) 800 MG TABLET    Take 1-2 tablets (800-1,600 mg total) by mouth See admin instructions. 2 by mouth every morning, 1 at lunch, and 2 at bedtime. From pain clinic only   GLUCOSE BLOOD (ACCU-CHEK AVIVA PLUS) TEST STRIP    1 each by Other route See admin instructions. Check blood sugar twice daily   INSULIN GLARGINE (LANTUS SOLOSTAR) 100 UNIT/ML SOLOSTAR PEN    Inject 18 Units into the skin daily.   INSULIN PEN NEEDLE (NOVOFINE) 30G X 8 MM MISC    Inject 1 packet into  the skin See admin instructions. Use with insulin once daily.   LISINOPRIL (PRINIVIL,ZESTRIL) 20 MG TABLET    One daily to control BP and strengthen the heart   LUBIPROSTONE (AMITIZA) 24 MCG CAPSULE    Take 1 capsule (24 mcg total) by mouth 2 (two) times daily with a meal.   METFORMIN (GLUCOPHAGE) 1000 MG TABLET    Take 1,000 mg by mouth 2 (two) times daily with a meal.   METHADONE (DOLOPHINE) 10 MG TABLET    Take 1 tablet (10 mg total) by mouth 3 (three) times daily.   METOPROLOL (LOPRESSOR) 50 MG TABLET    Take 1 tablet (50 mg total) by mouth 2 (two) times daily.   MULTIPLE VITAMIN (MULITIVITAMIN WITH MINERALS) TABS    Take 1 tablet by mouth daily.   NYSTATIN-TRIAMCINOLONE (MYCOLOG II) CREAM    Apply sparingly to rash twice daily   PANTOPRAZOLE (PROTONIX) 40 MG TABLET    TAKE 1 TABLET TWO TIMES DAILY.   POTASSIUM CHLORIDE SA (K-DUR,KLOR-CON) 20 MEQ TABLET    Take 20 mEq by mouth daily.   PSYLLIUM (METAMUCIL) 58.6 % PACKET    Take 1 packet by mouth daily.   SPIRONOLACTONE (ALDACTONE) 50 MG TABLET    Take 50 mg by mouth daily.   TORSEMIDE (DEMADEX) 100 MG TABLET    One  twice daily for diuretic   TRAZODONE (DESYREL) 150 MG TABLET    Take 1 tablet (150 mg total) by mouth at bedtime. 2 by mouth at bedtime  Modified Medications   No medications on file  Discontinued Medications   No medications on file     Review of Systems  Constitutional: Negative for fever, activity change, appetite change, fatigue and unexpected weight change.       Morbidly obese  HENT: Negative for congestion, ear pain, hearing loss, rhinorrhea, sore throat, tinnitus, trouble swallowing and voice change.   Eyes:       Corrective lenses  Respiratory: Negative for cough, choking, chest tightness, shortness of breath and wheezing.   Cardiovascular: Negative for chest pain and palpitations.  Gastrointestinal: Negative for nausea, abdominal pain, diarrhea, constipation and abdominal distention.  Endocrine: Negative for  cold intolerance, heat intolerance, polydipsia, polyphagia and polyuria.  Genitourinary: Negative for dysuria, urgency, frequency and testicular pain.       Not incontinent  Musculoskeletal: Negative for myalgias, back pain, arthralgias, gait problem and neck pain.  Skin: Positive for rash. Negative for color change and pallor.       erythema of the anterior shins. Patient has intertrigo under the breasts and in the groin area bilaterally. There is some burning discomfort. Blotchy, red facial rash present about 8 months.  Allergic/Immunologic: Negative.   Neurological: Negative for dizziness, tremors, syncope, speech difficulty, weakness, numbness and headaches.       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  Hematological: Negative for adenopathy. Does not bruise/bleed easily.  Psychiatric/Behavioral: Negative for hallucinations, behavioral problems, confusion, sleep disturbance and decreased concentration. The patient is not nervous/anxious.     Filed Vitals:   05/16/14 1212  BP: 138/78  Pulse: 77  Temp: 97.9 F (36.6 C)  TempSrc: Oral  Resp: 20  Height: 5\' 8"  (1.727 m)  Weight: 398 lb 6.4 oz (180.713 kg)  SpO2: 94%   Body mass index is 60.59 kg/(m^2).  Physical Exam  Constitutional: He is oriented to person, place, and time. He appears well-developed and well-nourished. No distress.  Morbid obesity  HENT:  Right Ear: External ear normal.  Left Ear: External ear normal.  Nose: Nose normal.  Mouth/Throat: Oropharynx is clear and moist. No oropharyngeal exudate.  Eyes: Conjunctivae and EOM are normal. Pupils are equal, round, and reactive to light.  Neck: No JVD present. No tracheal deviation present. No thyromegaly present.  Cardiovascular: Normal rate, regular rhythm, normal heart sounds and intact distal pulses.  Exam reveals no gallop and no friction rub.   No murmur heard. Pulmonary/Chest:  No respiratory distress. He has no wheezes. He has no rales. He exhibits no tenderness.  Abdominal: He exhibits no distension and no mass. There is no tenderness.  Anasarca with intra-abdominal fluid.  Musculoskeletal: He exhibits edema. He exhibits no tenderness.  Using cane. Unstable gait.  Lymphadenopathy:    He has no cervical adenopathy.  Neurological: He is alert and oriented to person, place, and time. He has normal reflexes. No cranial nerve deficit. Coordination normal.  Skin: Rash noted. There is erythema (both shins). No pallor.  Chronic venous stasis changes of both anterior legs. Erythematous intertrigo under the breasts and in the groin area bilaterally. Diffuse, blotchy facial rash. Blotches are about the size of the end of my finger. It does not seem to involve the scalp, but does cover the forehead and cheeks,  chin, and anterior neck.   Psychiatric: He has a normal mood and affect. His behavior is normal. Judgment and thought content normal.     Labs reviewed: Appointment on 03/14/2014  Component Date Value Ref Range Status  . Glucose 03/14/2014 223* 65 - 99 mg/dL Final  . BUN 03/14/2014 26  8 - 27 mg/dL Final  . Creatinine, Ser 03/14/2014 1.51* 0.76 - 1.27 mg/dL Final  . GFR calc non Af Amer 03/14/2014 48* >59 mL/min/1.73 Final  . GFR calc Af Amer 03/14/2014 56* >59 mL/min/1.73 Final  . BUN/Creatinine Ratio 03/14/2014 17  10 - 22 Final  . Sodium 03/14/2014 137  134 - 144 mmol/L Final  . Potassium 03/14/2014 5.3* 3.5 - 5.2 mmol/L Final  . Chloride 03/14/2014 90* 97 - 108 mmol/L Final  . CO2 03/14/2014 24  18 - 29 mmol/L Final  . Calcium 03/14/2014 9.8  8.6 - 10.2 mg/dL Final  . Total Protein 03/14/2014 7.5  6.0 - 8.5 g/dL Final  . Albumin 03/14/2014 4.3  3.6 - 4.8 g/dL Final  . Globulin, Total 03/14/2014 3.2  1.5 - 4.5 g/dL Final  . Albumin/Globulin Ratio 03/14/2014 1.3  1.1 - 2.5 Final  . Total Bilirubin 03/14/2014 0.4  0.0 - 1.2 mg/dL Final  . Alkaline  Phosphatase 03/14/2014 62  39 - 117 IU/L Final  . AST 03/14/2014 64* 0 - 40 IU/L Final  . ALT 03/14/2014 37  0 - 44 IU/L Final  . TSH 03/14/2014 2.530  0.450 - 4.500 uIU/mL Final  Appointment on 02/28/2014  Component Date Value Ref Range Status  . Total Bilirubin 02/28/2014 0.5  0.2 - 1.2 mg/dL Final  . Bilirubin, Direct 02/28/2014 0.1  0.0 - 0.3 mg/dL Final  . Alkaline Phosphatase 02/28/2014 53  39 - 117 U/L Final  . AST 02/28/2014 49* 0 - 37 U/L Final  . ALT 02/28/2014 31  0 - 53 U/L Final  . Total Protein 02/28/2014 7.4  6.0 - 8.3 g/dL Final  . Albumin 02/28/2014 3.7  3.5 - 5.2 g/dL Final  . Hep B Core Total Ab 02/28/2014 NON REACTIVE  NON REACTIVE Final  . Hep B S Ab 02/28/2014 NEG  NEGATIVE Final  . HCV Ab 02/28/2014 NEGATIVE  NEGATIVE Final  . Hepatitis B Surface Ag 02/28/2014 NEGATIVE  NEGATIVE Final     Assessment/Plan  1. DM type 2, uncontrolled, with neuropathy -A1c and CMP done today  2. Essential hypertension -CMP done today  3. Anasarca Improved on current diuretic therapy  4. Intertrigo Improved on Mycolog-II  5. Rash and nonspecific skin eruption He would like a dermatologic referral for evaluation of the skin  6. Anemia, unspecified anemia type - CBC With Differential; Future  7. B12 deficiency anemia - CBC With Differential; Future - Vitamin B12; Future

## 2014-05-17 LAB — COMPREHENSIVE METABOLIC PANEL
ALK PHOS: 57 IU/L (ref 39–117)
ALT: 30 IU/L (ref 0–44)
AST: 43 IU/L — ABNORMAL HIGH (ref 0–40)
Albumin/Globulin Ratio: 1.3 (ref 1.1–2.5)
Albumin: 4 g/dL (ref 3.6–4.8)
BILIRUBIN TOTAL: 0.4 mg/dL (ref 0.0–1.2)
BUN / CREAT RATIO: 18 (ref 10–22)
BUN: 30 mg/dL — AB (ref 8–27)
CO2: 23 mmol/L (ref 18–29)
Calcium: 9.5 mg/dL (ref 8.6–10.2)
Chloride: 93 mmol/L — ABNORMAL LOW (ref 97–108)
Creatinine, Ser: 1.69 mg/dL — ABNORMAL HIGH (ref 0.76–1.27)
GFR calc non Af Amer: 42 mL/min/{1.73_m2} — ABNORMAL LOW (ref >59)
GFR, EST AFRICAN AMERICAN: 49 mL/min/{1.73_m2} — AB (ref >59)
GLOBULIN, TOTAL: 3.1 g/dL (ref 1.5–4.5)
Glucose: 192 mg/dL — ABNORMAL HIGH (ref 65–99)
Potassium: 4.8 mmol/L (ref 3.5–5.2)
Sodium: 138 mmol/L (ref 134–144)
TOTAL PROTEIN: 7.1 g/dL (ref 6.0–8.5)

## 2014-05-17 LAB — CBC WITH DIFFERENTIAL
BASOS ABS: 0 10*3/uL (ref 0.0–0.2)
Basos: 0 %
Eos: 4 %
Eosinophils Absolute: 0.3 10*3/uL (ref 0.0–0.4)
HEMATOCRIT: 37.3 % — AB (ref 37.5–51.0)
HEMOGLOBIN: 12 g/dL — AB (ref 12.6–17.7)
IMMATURE GRANULOCYTES: 0 %
Immature Grans (Abs): 0 10*3/uL (ref 0.0–0.1)
LYMPHS ABS: 2.6 10*3/uL (ref 0.7–3.1)
Lymphs: 29 %
MCH: 26.3 pg — ABNORMAL LOW (ref 26.6–33.0)
MCHC: 32.2 g/dL (ref 31.5–35.7)
MCV: 82 fL (ref 79–97)
MONOS ABS: 0.6 10*3/uL (ref 0.1–0.9)
Monocytes: 7 %
NEUTROS ABS: 5.2 10*3/uL (ref 1.4–7.0)
Neutrophils Relative %: 60 %
RBC: 4.57 x10E6/uL (ref 4.14–5.80)
RDW: 18.3 % — AB (ref 12.3–15.4)
WBC: 8.8 10*3/uL (ref 3.4–10.8)

## 2014-05-17 LAB — HEMOGLOBIN A1C
Est. average glucose Bld gHb Est-mCnc: 212 mg/dL
Hgb A1c MFr Bld: 9 % — ABNORMAL HIGH (ref 4.8–5.6)

## 2014-05-17 LAB — VITAMIN B12: Vitamin B-12: 681 pg/mL (ref 211–946)

## 2014-05-17 IMAGING — CR DG ABDOMEN 1V
2 series · 2 of 2 positions shown · non-contrast
Comparison: Of prior radiograph from 05/08/2011

CLINICAL DATA: Abdominal pain

EXAM:
ABDOMEN - 1 VIEW

[t abdomen supine (1 of 2)]
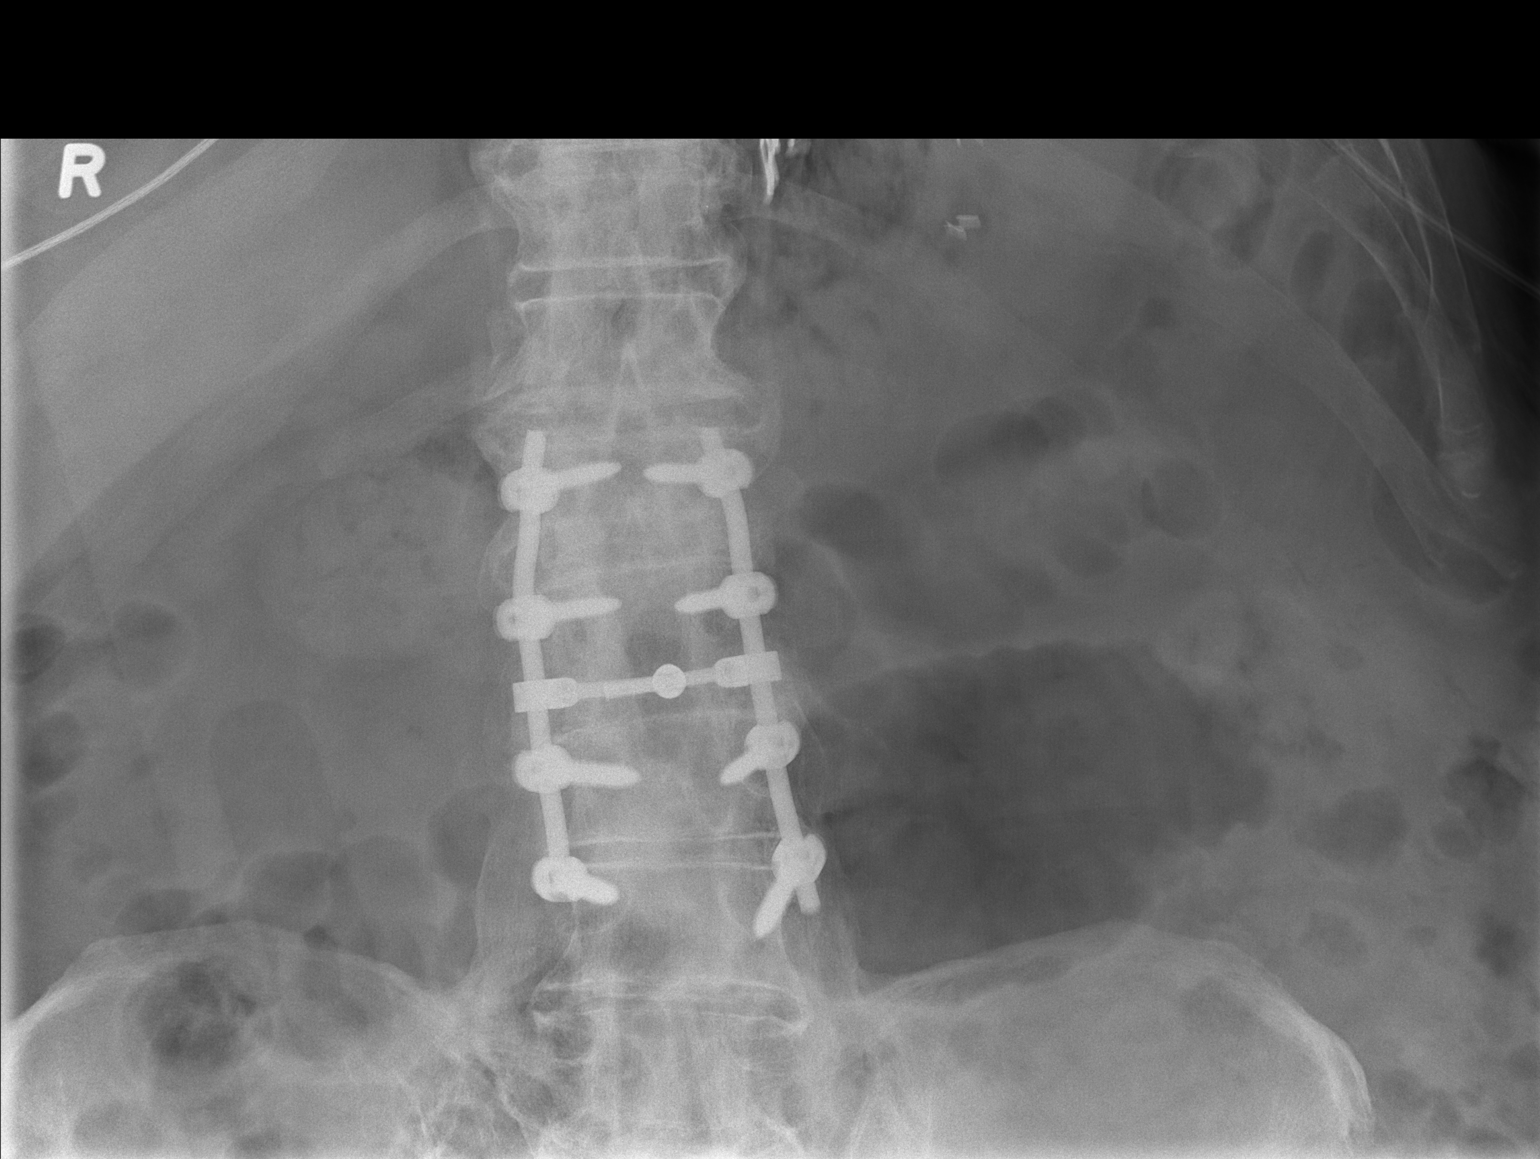

[t abdomen supine (2 of 2)]
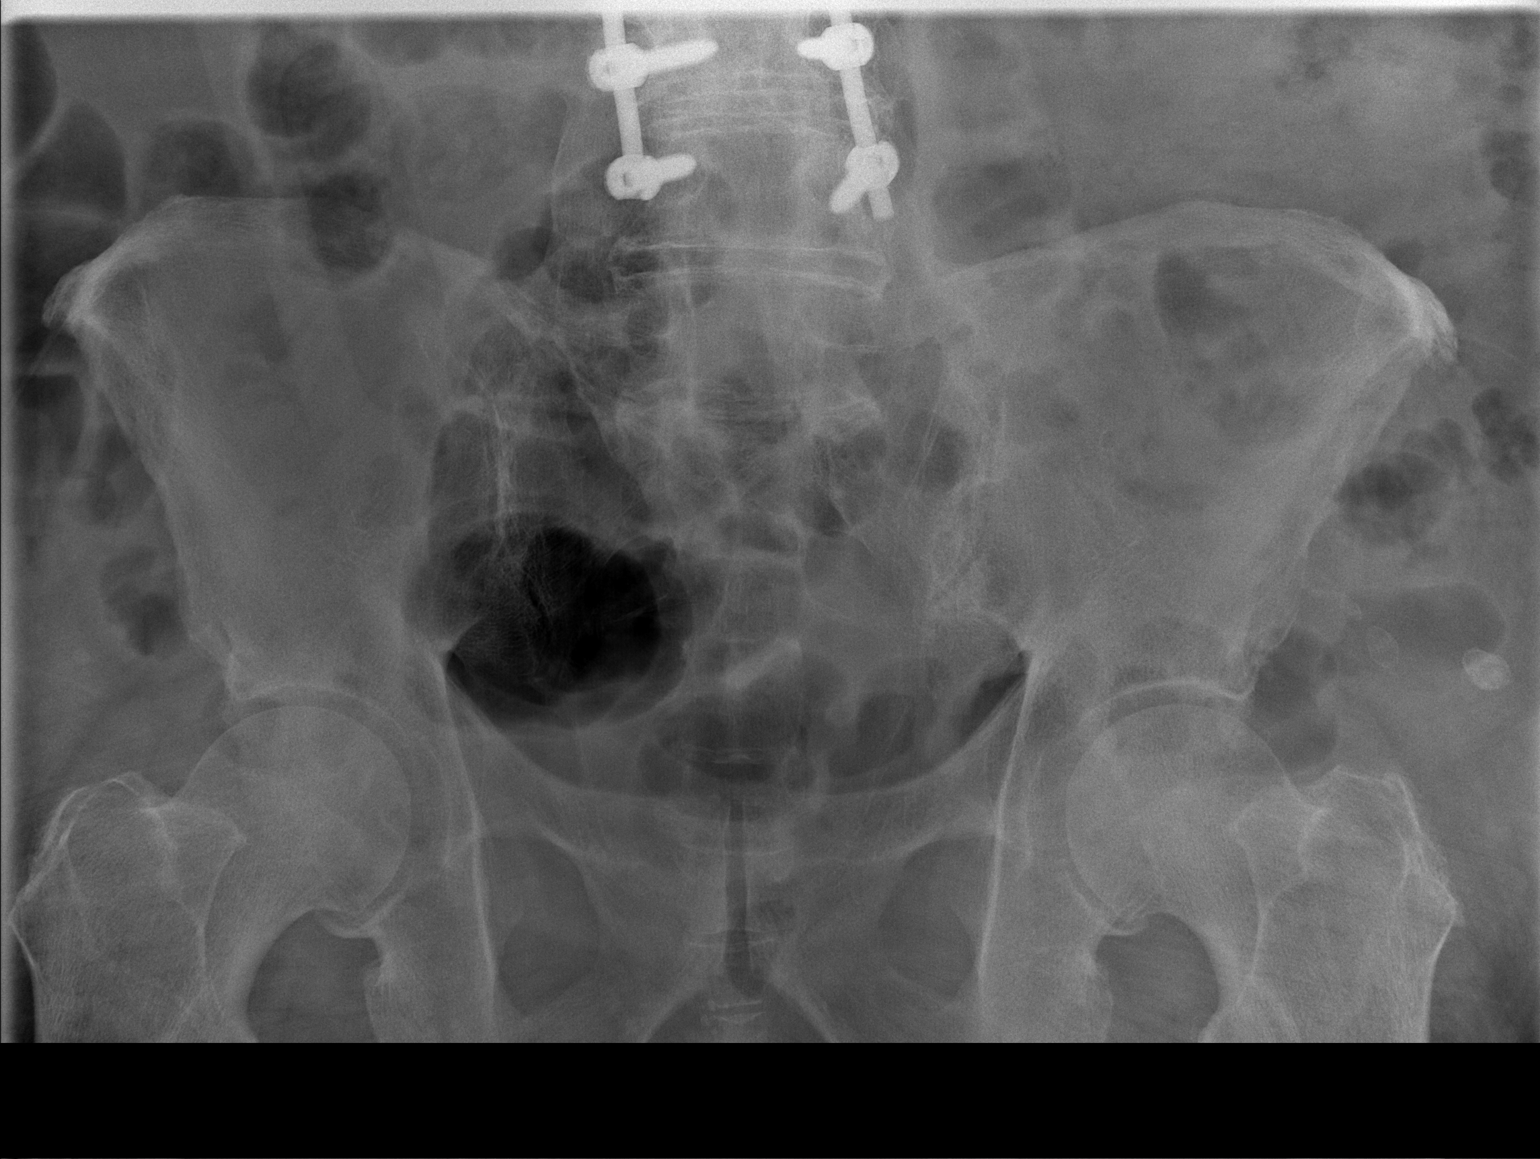

[2 of 2 positions shown; findings below may reference images not displayed]

FINDINGS: The visualized bowel gas pattern is nonspecific. No dilated loops of
bowel to suggest obstruction or ileus are identified. No soft tissue
masses identified. No free intraperitoneal air identified. No
abnormal bowel wall thickening appreciated. Several ovoid calcific
densities overlying the lateral left abdomen likely lie in the
external soft tissues, stable as compared to the prior exam.
Multiple surgical clips overlie the left upper quadrant.

Spinal fixation hardware again noted within the lower spine.
IMPRESSION: Nonobstructive bowel gas pattern with no acute intra-abdominal
process identified. .

## 2014-05-17 IMAGING — CR DG CHEST 1V PORT
1 series · 1 of 1 positions shown · non-contrast
Comparison: Prior radiograph from 01/16/2013

CLINICAL DATA: Right-sided line placement

EXAM:
PORTABLE CHEST - 1 VIEW

[AP]
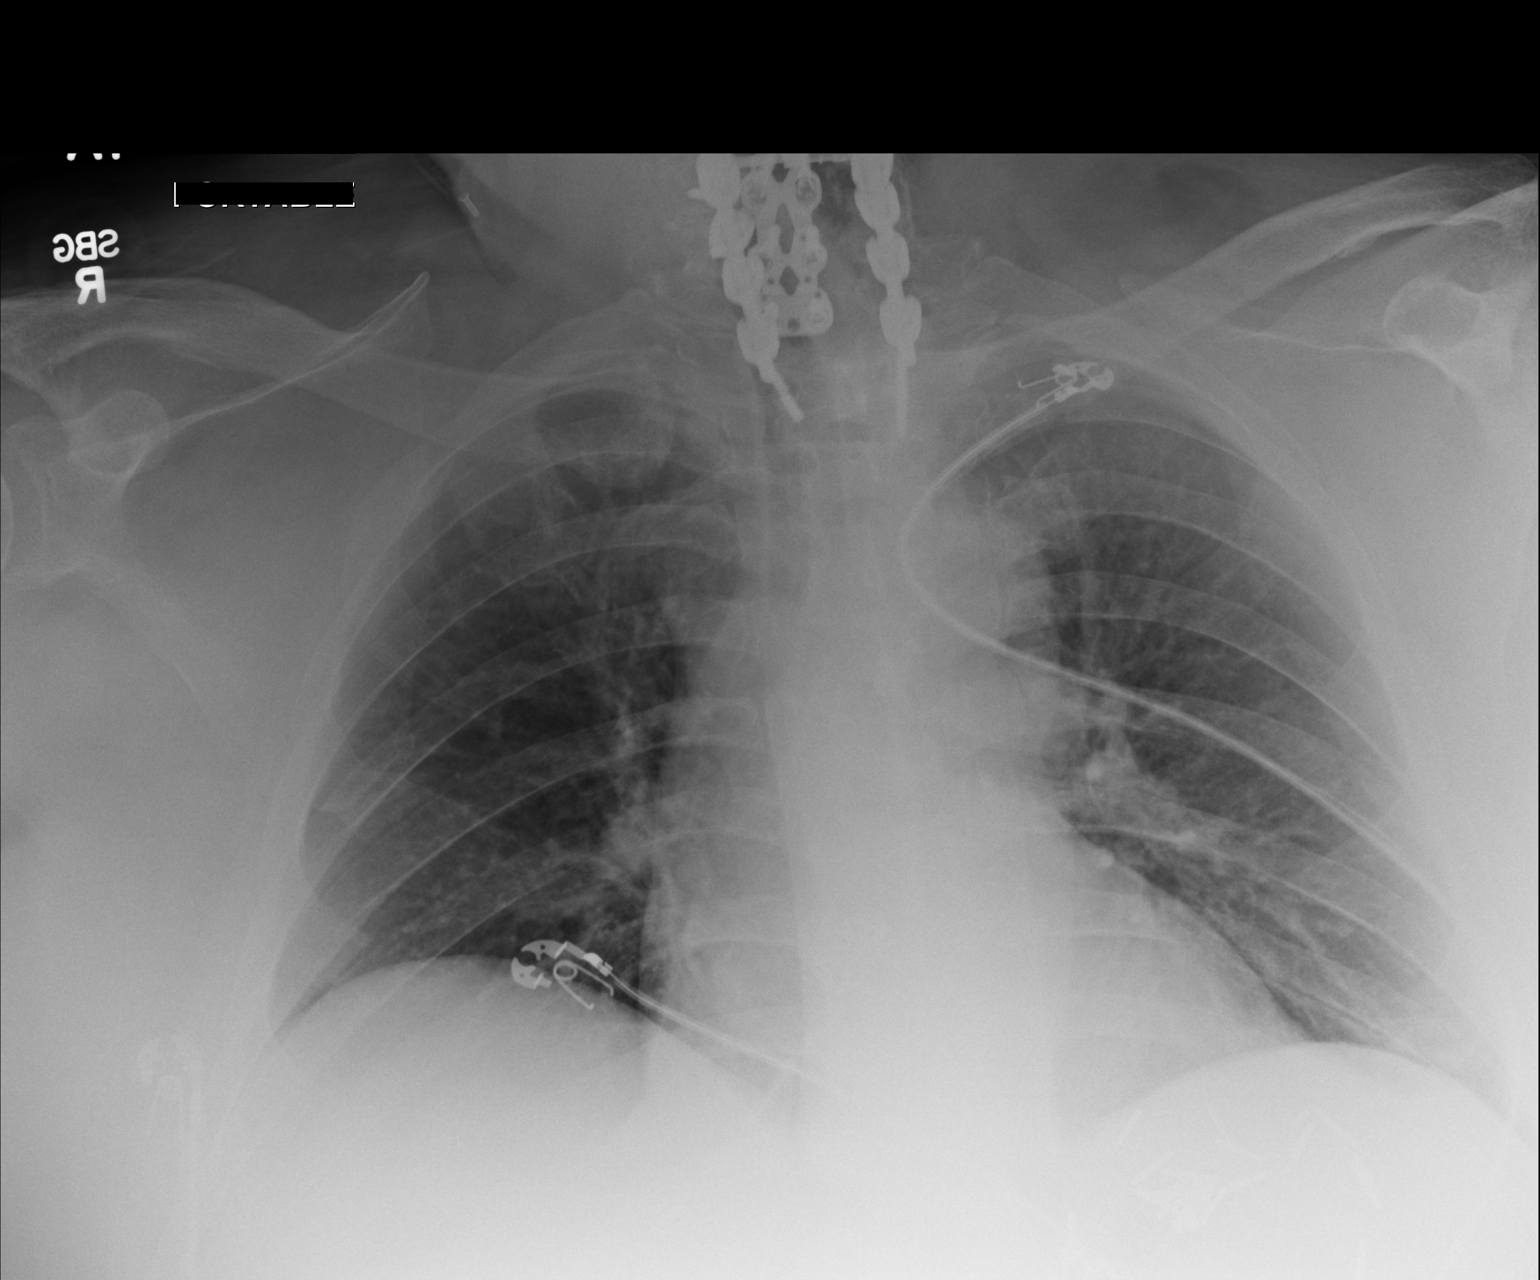

[1 of 1 positions shown; findings below may reference images not displayed]

FINDINGS: Cardiac and mediastinal silhouettes are unchanged.

Lungs are hypoinflated. No focal infiltrate, pulmonary edema, or
pleural effusion is identified. There is no pneumothorax.

Fixation hardware overlying the cervical spine is unchanged. No
acute osseous abnormality.
IMPRESSION: 1. Stable appearance of the chest with shallow lung inflation. No
pneumothorax status post attempted line placement.
2. No central line identified within the thorax.
These results were called by telephone at the time of interpretation
on 01/23/2013 at [DATE] to Dr.ERIDIOL TJETER , who verbally
acknowledged these results.

## 2014-05-18 ENCOUNTER — Telehealth: Payer: Self-pay

## 2014-05-18 NOTE — Telephone Encounter (Signed)
-----   Message from Estill Dooms, MD sent at 05/17/2014  1:14 PM EST ----- Call patient. Lab is doing okay. Blood sugar was still high at 192, but the A1c moved down to 9 from his previous high of 10. There is evidence of rising BUN and creatinine that are related to the intensive diuretic therapy that he is taking. Previous problems with anemia are improving with a rising hemoglobin 12.0. Vitamin B12 level is in the normal range. Potassium was normal at 4.8. It is okay to remain off the potassium.

## 2014-05-18 NOTE — Telephone Encounter (Signed)
Discussed results with patient, patient verbalized understanding of results, removed potassium form medication list. Mailed copy of report.

## 2014-05-23 ENCOUNTER — Other Ambulatory Visit: Payer: Self-pay

## 2014-05-23 MED ORDER — PANTOPRAZOLE SODIUM 40 MG PO TBEC: DELAYED_RELEASE_TABLET | ORAL | Status: DC

## 2014-05-23 NOTE — Telephone Encounter (Signed)
Sent to [facility]

## 2014-05-28 ENCOUNTER — Telehealth: Payer: Self-pay | Admitting: Internal Medicine

## 2014-05-28 ENCOUNTER — Other Ambulatory Visit: Payer: Self-pay | Admitting: Internal Medicine

## 2014-05-28 MED ORDER — MOVIPREP 100 G PO SOLR: ORAL | Status: DC

## 2014-05-28 NOTE — Telephone Encounter (Signed)
Patient requested Rx's be faxed to pharmacy. Faxed and patient notified.

## 2014-05-28 NOTE — Telephone Encounter (Signed)
Sent in the moviprep that patient needs.  Called and told the patient and reminded him to give them the free prep coupon we gave him at his visit.  Also apologized to patient that was not sent in.

## 2014-06-04 ENCOUNTER — Encounter (HOSPITAL_COMMUNITY): Payer: Self-pay | Admitting: *Deleted

## 2014-06-11 ENCOUNTER — Telehealth: Payer: Self-pay | Admitting: Internal Medicine

## 2014-06-11 ENCOUNTER — Telehealth: Payer: Self-pay

## 2014-06-11 NOTE — Telephone Encounter (Signed)
New directions for procedure up front for pick up, pt aware.

## 2014-06-11 NOTE — Telephone Encounter (Signed)
Patient called concern  about his short term memory getting worse. Dr. Nyoka Cowden had given him some web sites to look up about Alzheimer. Told patient I'll put down for [place] to do memory test when he comes in May to see Dr. Nyoka Cowden, and Dr. Nyoka Cowden is getting a Vitamin B12 test . He can talk to Dr. Nyoka Cowden more then. Patient was happy with this

## 2014-06-13 NOTE — Anesthesia Preprocedure Evaluation (Signed)
Anesthesia Evaluation  Patient identified by MRN, date of birth, ID band Patient awake    Reviewed: Allergy & Precautions, NPO status , Patient's Chart, lab work & pertinent test results  History of Anesthesia Complications Negative for: history of anesthetic complications  Airway Mallampati: III  TM Distance: >3 FB Neck ROM: Full    Dental no notable dental hx. (+) Dental Advisory Given   Pulmonary sleep apnea and Continuous Positive Airway Pressure Ventilation ,  breath sounds clear to auscultation  Pulmonary exam normal       Cardiovascular hypertension, Pt. on medications and Pt. on home beta blockers + Peripheral Vascular Disease and +CHF Rhythm:Regular Rate:Normal     Neuro/Psych  Headaches, PSYCHIATRIC DISORDERS Anxiety Depression    GI/Hepatic PUD, (+) Hepatitis -, B  Endo/Other  diabetesMorbid obesity (super mobid obesity)  Renal/GU negative Renal ROS  negative genitourinary   Musculoskeletal  (+) Arthritis -,   Abdominal (+) + obese,   Peds negative pediatric ROS (+)  Hematology  (+) anemia ,   Anesthesia Other Findings   Reproductive/Obstetrics negative OB ROS                             Anesthesia Physical Anesthesia Plan  ASA: IV  Anesthesia Plan: MAC   Post-op Pain Management:    Induction: Intravenous  Airway Management Planned: Nasal Cannula  Additional Equipment:   Intra-op Plan:   Post-operative Plan:   Informed Consent: I have reviewed the patients History and Physical, chart, labs and discussed the procedure including the risks, benefits and alternatives for the proposed anesthesia with the patient or authorized representative who has indicated his/her understanding and acceptance.   Dental advisory given  Plan Discussed with: CRNA  Anesthesia Plan Comments:         Anesthesia Quick Evaluation

## 2014-06-14 ENCOUNTER — Ambulatory Visit (HOSPITAL_COMMUNITY): Payer: Medicare Other | Admitting: Anesthesiology

## 2014-06-14 ENCOUNTER — Encounter (HOSPITAL_COMMUNITY): Payer: Self-pay | Admitting: Internal Medicine

## 2014-06-14 ENCOUNTER — Ambulatory Visit (HOSPITAL_COMMUNITY)
Admission: RE | Admit: 2014-06-14 | Discharge: 2014-06-14 | Disposition: A | Discharge status: Home / Self Care | Payer: Medicare Other | Source: Ambulatory Visit | Attending: Internal Medicine | Admitting: Internal Medicine

## 2014-06-14 ENCOUNTER — Encounter (HOSPITAL_COMMUNITY)
Admission: RE | Disposition: A | Discharge status: Home / Self Care | Payer: Self-pay | Source: Ambulatory Visit | Attending: Internal Medicine

## 2014-06-14 DIAGNOSIS — E119 Type 2 diabetes mellitus without complications: Secondary | ICD-10-CM | POA: Insufficient documentation

## 2014-06-14 DIAGNOSIS — Z86010 Personal history of colonic polyps: Secondary | ICD-10-CM | POA: Diagnosis not present

## 2014-06-14 DIAGNOSIS — K289 Gastrojejunal ulcer, unspecified as acute or chronic, without hemorrhage or perforation: Secondary | ICD-10-CM | POA: Diagnosis not present

## 2014-06-14 DIAGNOSIS — Z9989 Dependence on other enabling machines and devices: Secondary | ICD-10-CM | POA: Diagnosis not present

## 2014-06-14 DIAGNOSIS — I4891 Unspecified atrial fibrillation: Secondary | ICD-10-CM | POA: Insufficient documentation

## 2014-06-14 DIAGNOSIS — F329 Major depressive disorder, single episode, unspecified: Secondary | ICD-10-CM | POA: Diagnosis not present

## 2014-06-14 DIAGNOSIS — K317 Polyp of stomach and duodenum: Secondary | ICD-10-CM | POA: Insufficient documentation

## 2014-06-14 DIAGNOSIS — M1990 Unspecified osteoarthritis, unspecified site: Secondary | ICD-10-CM | POA: Insufficient documentation

## 2014-06-14 DIAGNOSIS — G4733 Obstructive sleep apnea (adult) (pediatric): Secondary | ICD-10-CM | POA: Insufficient documentation

## 2014-06-14 DIAGNOSIS — Z79899 Other long term (current) drug therapy: Secondary | ICD-10-CM | POA: Diagnosis not present

## 2014-06-14 DIAGNOSIS — Z79891 Long term (current) use of opiate analgesic: Secondary | ICD-10-CM | POA: Insufficient documentation

## 2014-06-14 DIAGNOSIS — F419 Anxiety disorder, unspecified: Secondary | ICD-10-CM | POA: Insufficient documentation

## 2014-06-14 DIAGNOSIS — I509 Heart failure, unspecified: Secondary | ICD-10-CM | POA: Insufficient documentation

## 2014-06-14 DIAGNOSIS — I739 Peripheral vascular disease, unspecified: Secondary | ICD-10-CM | POA: Diagnosis not present

## 2014-06-14 DIAGNOSIS — Z794 Long term (current) use of insulin: Secondary | ICD-10-CM | POA: Insufficient documentation

## 2014-06-14 DIAGNOSIS — Z1211 Encounter for screening for malignant neoplasm of colon: Secondary | ICD-10-CM | POA: Insufficient documentation

## 2014-06-14 DIAGNOSIS — Z9884 Bariatric surgery status: Secondary | ICD-10-CM | POA: Insufficient documentation

## 2014-06-14 DIAGNOSIS — D131 Benign neoplasm of stomach: Secondary | ICD-10-CM | POA: Insufficient documentation

## 2014-06-14 DIAGNOSIS — I10 Essential (primary) hypertension: Secondary | ICD-10-CM | POA: Diagnosis not present

## 2014-06-14 DIAGNOSIS — K5730 Diverticulosis of large intestine without perforation or abscess without bleeding: Secondary | ICD-10-CM | POA: Diagnosis not present

## 2014-06-14 HISTORY — DX: Localized edema: R60.0

## 2014-06-14 HISTORY — PX: COLONOSCOPY WITH PROPOFOL: SHX5780

## 2014-06-14 HISTORY — PX: ESOPHAGOGASTRODUODENOSCOPY: SHX5428

## 2014-06-14 LAB — GLUCOSE, CAPILLARY: GLUCOSE-CAPILLARY: 158 mg/dL — AB (ref 70–99)

## 2014-06-14 SURGERY — COLONOSCOPY WITH PROPOFOL
Anesthesia: Monitor Anesthesia Care

## 2014-06-14 MED ORDER — FENTANYL CITRATE 0.05 MG/ML IJ SOLN: 25.0000 ug | INTRAMUSCULAR | Status: DC | PRN

## 2014-06-14 MED ORDER — SODIUM CHLORIDE 0.9 % IV SOLN
INTRAVENOUS | Status: DC
  Administered 2014-06-14: 1000 mL via INTRAVENOUS

## 2014-06-14 MED ORDER — PROPOFOL 10 MG/ML IV BOLUS
INTRAVENOUS | Status: AC
  Filled 2014-06-14: qty 20

## 2014-06-14 MED ORDER — METHADONE HCL 10 MG PO TABS: 10.0000 mg | ORAL_TABLET | Freq: Four times a day (QID) | ORAL | Status: DC

## 2014-06-14 MED ORDER — LACTATED RINGERS IV SOLN: INTRAVENOUS | Status: DC | PRN

## 2014-06-14 MED ORDER — TRAZODONE HCL 150 MG PO TABS: 300.0000 mg | ORAL_TABLET | Freq: Every day | ORAL | Status: DC

## 2014-06-14 MED ORDER — ONDANSETRON HCL 4 MG/2ML IJ SOLN: 4.0000 mg | Freq: Once | INTRAMUSCULAR | Status: DC | PRN

## 2014-06-14 MED ORDER — GABAPENTIN 800 MG PO TABS: 800.0000 mg | ORAL_TABLET | Freq: Every day | ORAL | Status: DC

## 2014-06-14 MED ORDER — LIDOCAINE HCL (CARDIAC) 20 MG/ML IV SOLN
INTRAVENOUS | Status: AC
  Filled 2014-06-14: qty 5

## 2014-06-14 MED ORDER — PROPOFOL INFUSION 10 MG/ML OPTIME
INTRAVENOUS | Status: DC | PRN
  Administered 2014-06-14: 100 ug/kg/min via INTRAVENOUS

## 2014-06-14 MED ORDER — GLYCOPYRROLATE 0.2 MG/ML IJ SOLN
INTRAMUSCULAR | Status: DC | PRN
  Administered 2014-06-14: 0.2 mg via INTRAVENOUS

## 2014-06-14 MED ORDER — LIDOCAINE HCL (CARDIAC) 20 MG/ML IV SOLN
INTRAVENOUS | Status: DC | PRN
  Administered 2014-06-14: 75 mg via INTRAVENOUS

## 2014-06-14 MED ORDER — INSULIN GLARGINE 100 UNIT/ML SOLOSTAR PEN: PEN_INJECTOR | SUBCUTANEOUS | Status: DC

## 2014-06-14 SURGICAL SUPPLY — 21 items

## 2014-06-14 NOTE — Transfer of Care (Signed)
Immediate Anesthesia Transfer of Care Note  Patient: Phillip Maldonado  Procedure(s) Performed: Procedure(s): COLONOSCOPY WITH PROPOFOL (N/A) ESOPHAGOGASTRODUODENOSCOPY (EGD) (N/A)  Patient Location: PACU  Anesthesia Type:MAC  Level of Consciousness: awake, alert , oriented and patient cooperative  Airway & Oxygen Therapy: Patient Spontanous Breathing and Patient connected to face mask oxygen  Post-op Assessment: Report given to RN, Post -op Vital signs reviewed and stable and Patient moving all extremities X 4  Post vital signs: stable  Last Vitals:  Filed Vitals:   06/14/14 0903  BP: 130/58  Pulse: 76  Temp: 36.7 C  Resp: 11    Complications: No apparent anesthesia complications

## 2014-06-14 NOTE — H&P (Addendum)
[facility] History and Physical   Primary Care Physician:  Estill Dooms, MD   Reason for Procedure:   1) F/U marginal ulcer stomach pouch and 2) colon cancer screening  Plan:    EGD and colonoscopy - The risks and benefits as well as alternatives of endoscopic procedure(s) have been discussed and reviewed. All questions answered. The patient agrees to proceed.  HPI: ORLANDER NORWOOD is a 64 y.o. 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  Past Medical History  Diagnosis Date  . CHF (congestive heart failure)   . Hypertension   . Anemia   . Angina   . Shortness of breath   . Anxiety   . Blood transfusion     last 10' 14- "GI bleed"  . Headache(784.0)   . Depression   . Hepatitis B   . Neuromuscular disorder   . Arthritis   . Diabetes mellitus 09/18/2011  . Atrial fibrillation with RVR 01/24/2013  . Renal insufficiency   . DJD (degenerative joint disease)   . History of alcohol abuse     last drink in 1993  . Obesity   . Diverticulosis   . Fatty liver   . Sleep apnea, obstructive     does where bipap. 06-04-14 "doesn't use much now, no mask/tubing now.  . Pneumonia     not at present time  . Edema extremities     bilateral lower extremities-"weepy areas due to fluid retention"    Past Surgical History  Procedure Laterality Date  . Gastric bypass  1977    Reversed in 1992 and revision 1994  . Diagnostic laparoscopy    . Esophagoscopy N/A 01/01/2013    Procedure: ESOPHAGOSCOPY;  Surgeon: Jeryl Columbia, MD;  Location: [place];  Service: Endoscopy;  Laterality: N/A;  . Esophagogastroduodenoscopy N/A 01/03/2013    Procedure: ESOPHAGOGASTRODUODENOSCOPY (EGD);  Surgeon: Arta Silence, MD;  Location: [facility];  Service: Endoscopy;  Laterality: N/A;  . Esophagogastroduodenoscopy N/A 01/03/2013    Procedure: ESOPHAGOGASTRODUODENOSCOPY (EGD);  Surgeon: Arta Silence, MD;  Location: [facility];  Service: Endoscopy;   Laterality: N/A;  . Esophagogastroduodenoscopy N/A 01/23/2013    Procedure: ESOPHAGOGASTRODUODENOSCOPY (EGD);  Surgeon: Lear Ng, MD;  Location: [facility];  Service: Endoscopy;  Laterality: N/A;  . Esophagogastroduodenoscopy Left 01/27/2013    Procedure: ESOPHAGOGASTRODUODENOSCOPY (EGD);  Surgeon: Lear Ng, MD;  Location: [facility];  Service: Endoscopy;  Laterality: Left;  . Knee surgery Left 2004  . Hydrocele excision  1996    x 2   . Abdominal surgery      [phone]  . Colonoscopy    . Back surgery      x 8 -,multiple fusions(cervical to lumbar)    Prior to Admission medications   Medication Sig Start Date End Date Taking? Authorizing Provider  acetaminophen (TYLENOL) 650 MG CR tablet Take 1,950 mg by mouth 2 (two) times daily.   Yes Historical Provider, MD  amLODipine (NORVASC) 10 MG tablet TAKE 1 TABLET DAILY. 05/28/14  Yes Estill Dooms, MD  atorvastatin (LIPITOR) 40 MG tablet take 1 tablet once daily 04/16/14  Yes Estill Dooms, MD  docusate sodium (COLACE) 100 MG capsule Take 200 mg by mouth 2 (two) times daily as needed for mild constipation.    Yes Historical Provider, MD  DULoxetine (CYMBALTA) 60 MG capsule One twice daily to help neuropathy Patient taking differently: Take 60 mg by mouth 2 (two) times daily. One twice daily  to help neuropathy 02/14/14  Yes Estill Dooms, MD  gabapentin (NEURONTIN) 800 MG tablet TAKE TWO TABLETS EVERY MORNING, ONE TABLET AT LUNCH, AND TWO AT BEDTIME Patient taking differently: take 1 tablet once daily 05/28/14  Yes Estill Dooms, MD  LANTUS SOLOSTAR 100 UNIT/ML Solostar Pen INJECT TEN UNITS EVERY MORNING Patient taking differently: INJECT 18 UNITS EVERY MORNING 05/28/14  Yes Estill Dooms, MD  lisinopril (PRINIVIL,ZESTRIL) 20 MG tablet One daily to control BP and strengthen the heart 05/11/14  Yes Estill Dooms, MD  lubiprostone (AMITIZA) 24 MCG capsule Take 1 capsule (24 mcg total) by mouth 2 (two) times daily with a  meal. 04/26/14  Yes Gatha Mayer, MD  metFORMIN (GLUCOPHAGE) 1000 MG tablet Take 1,000 mg by mouth 2 (two) times daily with a meal.   Yes Historical Provider, MD  methadone (DOLOPHINE) 10 MG tablet Take 1 tablet (10 mg total) by mouth 3 (three) times daily. Patient taking differently: Take 10 mg by mouth 4 (four) times daily.  12/26/13  Yes Mahima Bubba Camp, MD  metoprolol (LOPRESSOR) 50 MG tablet TAKE ONE TABLET TWICE DAILY 05/28/14  Yes Estill Dooms, MD  Multiple Vitamin (MULITIVITAMIN WITH MINERALS) TABS Take 1 tablet by mouth daily.   Yes Historical Provider, MD  nystatin-triamcinolone [facility] II) cream Apply sparingly to rash twice daily 04/11/14  Yes Estill Dooms, MD  pantoprazole (PROTONIX) 40 MG tablet TAKE 1 TABLET TWO TIMES DAILY. 05/23/14  Yes Estill Dooms, MD  psyllium (METAMUCIL) 58.6 % packet Take 1 packet by mouth daily.   Yes Historical Provider, MD  spironolactone (ALDACTONE) 50 MG tablet Take 50 mg by mouth daily.   Yes Historical Provider, MD  torsemide (DEMADEX) 100 MG tablet One twice daily for diuretic Patient taking differently: Take 100 mg by mouth 2 (two) times daily. One twice daily for diuretic 02/14/14  Yes Estill Dooms, MD  traZODone (DESYREL) 150 MG tablet Take 1 tablet (150 mg total) by mouth at bedtime. 2 by mouth at bedtime Patient taking differently: Take 300 mg by mouth at bedtime. 2 by mouth at bedtime 12/26/13  Yes Mahima Pandey, MD  glucose blood (ACCU-CHEK AVIVA PLUS) test strip 1 each by Other route See admin instructions. Check blood sugar twice daily    Historical Provider, MD  Insulin Pen Needle (NOVOFINE) 30G X 8 MM MISC Inject 1 packet into the skin See admin instructions. Use with insulin once daily.    Historical Provider, MD  MOVIPREP 100 G SOLR Use per prep instructions 05/28/14   Gatha Mayer, MD    Current Facility-Administered Medications  Medication Dose Route Frequency Provider Last Rate Last Dose  . 0.9 %  sodium chloride infusion    Intravenous Continuous Gatha Mayer, MD      . fentaNYL (SUBLIMAZE) injection 25-50 mcg  25-50 mcg Intravenous Q5 min PRN Lauretta Grill, MD      . ondansetron [facility]) injection 4 mg  4 mg Intravenous Once PRN Lauretta Grill, MD        Allergies as of 04/26/2014 - Review Complete 04/26/2014  Allergen Reaction Noted  . Adhesive [tape] Other (See Comments) 05/08/2011  . Feldene [piroxicam] Other (See Comments) 05/08/2011  . Latex Other (See Comments) 05/08/2011    Family History  Problem Relation Age of Onset  . Heart disease Father   . Skin cancer Father   . COPD Father   . Hypertension Father   . Heart disease Maternal Uncle   .  High blood pressure Sister   . High blood pressure Sister   . High blood pressure Son   . Diabetes Daughter   . Depression Son   . Depression Son   . Stroke Father   . Arthritis Mother   . Arthritis Sister   . Arthritis Sister   . Osteoporosis Mother   . Cancer Maternal Grandmother   . Heart disease Maternal Uncle   . Heart disease Paternal Uncle     History   Social History  . Marital Status: Single    Spouse Name: N/A  . Number of Children: 6  . Years of Education: N/A   Occupational History  . DISABLED    Social History Main Topics  . Smoking status: Never Smoker   . Smokeless tobacco: Not on file  . Alcohol Use: No  . Drug Use: No  . Sexual Activity: Not Currently   Other Topics Concern  . Not on file   Social History Narrative   Patient is divorced, he has a total of 6 children 2 of which are deceased. He is a retired Conservation officer, historic buildings. History of alcoholism. No alcohol now. 2 caffeinated beverages daily. He moved to [place] in last 1-2 years.    Review of Systems: Positive for eyeglasses, chronic pain All other review of systems negative except as mentioned in the HPI.  Physical Exam: Vital signs in last 24 hours:     General:   Alert,  Well-developed, well-nourished, pleasant and cooperative in NAD Lungs:  Clear  throughout to auscultation.   Heart:  Regular rate and rhythm; no murmurs, clicks, rubs,  or gallops. Abdomen:  Obese Soft, nontender and nondistended. Normal bowel sounds.  SQ nodules Neuro/Psych:  Alert and cooperative. Normal mood and affect. A and O x 3   @Brandom Kerwin  Simonne Maffucci, MD, [facility] [phone] (pager) 06/14/2014 8:59 AM@

## 2014-06-14 NOTE — Anesthesia Postprocedure Evaluation (Signed)
  Anesthesia Post-op Note  Patient: Phillip Maldonado  Procedure(s) Performed: Procedure(s) (LRB): COLONOSCOPY WITH PROPOFOL (N/A) ESOPHAGOGASTRODUODENOSCOPY (EGD) (N/A)  Patient Location: PACU  Anesthesia Type: MAC  Level of Consciousness: awake and alert   Airway and Oxygen Therapy: Patient Spontanous Breathing  Post-op Pain: mild  Post-op Assessment: Post-op Vital signs reviewed, Patient's Cardiovascular Status Stable, Respiratory Function Stable, Patent Airway and No signs of Nausea or vomiting  Last Vitals:  Filed Vitals:   06/14/14 1130  BP: 122/59  Pulse: 84  Temp:   Resp: 17    Post-op Vital Signs: stable   Complications: No apparent anesthesia complications

## 2014-06-14 NOTE — Discharge Instructions (Addendum)
° °  I found some stomach polyps - they look benign and are likely not a problem. I will let you know pathology results by mail or phone.  The colonoscopy showed diverticulosis. I was unable to reach the very beginning of the colon - I estimate I saw 90-95% of your colon. No polyps or cancer seen. I suggest that we check your stool for blood next year - I do not recommend trying other tests to look at the remaining part of the colon as they would be less accurate than colonoscopy.  I appreciate the opportunity to care for you. Gatha Mayer, MD, FACG  YOU HAD AN ENDOSCOPIC PROCEDURE TODAY: Refer to the procedure report and other information in the discharge instructions given to you for any specific questions about what was found during the examination. If this information does not answer your questions, please call Dr. Celesta Aver office at [phone] to clarify.   YOU SHOULD EXPECT: Some feelings of bloating in the abdomen. Passage of more gas than usual. Walking can help get rid of the air that was put into your GI tract during the procedure and reduce the bloating. If you had a lower endoscopy (such as a colonoscopy or flexible sigmoidoscopy) you may notice spotting of blood in your stool or on the toilet paper. Some abdominal soreness may be present for a day or two, also.  DIET: Your first meal following the procedure should be a light meal and then it is ok to progress to your normal diet. A half-sandwich or bowl of soup is an example of a good first meal. Heavy or fried foods are harder to digest and may make you feel nauseous or bloated. Drink plenty of fluids but you should avoid alcoholic beverages for 24 hours.   ACTIVITY: Your care partner should take you home directly after the procedure. You should plan to take it easy, moving slowly for the rest of the day. You can resume normal activity the day after the procedure however YOU SHOULD NOT DRIVE, use power tools, machinery or perform  tasks that involve climbing or major physical exertion for 24 hours (because of the sedation medicines used during the test).   SYMPTOMS TO REPORT IMMEDIATELY: A gastroenterologist can be reached at any hour. Please call [phone]  for any of the following symptoms:  Following lower endoscopy (colonoscopy, flexible sigmoidoscopy) Excessive amounts of blood in the stool  Significant tenderness, worsening of abdominal pains  Swelling of the abdomen that is new, acute  Fever of 100 or higher  Following upper endoscopy (EGD, EUS, ERCP, esophageal dilation) Vomiting of blood or coffee ground material  New, significant abdominal pain  New, significant chest pain or pain under the shoulder blades  Painful or persistently difficult swallowing  New shortness of breath  Black, tarry-looking or red, bloody stools  FOLLOW UP:  If any biopsies were taken you will be contacted by phone or by letter within the next 1-3 weeks. Call [phone]  if you have not heard about the biopsies in 3 weeks.  Please also call with any specific questions about appointments or follow up tests.

## 2014-06-14 NOTE — Op Note (Signed)
[facility] [place], [id number]   ENDOSCOPY PROCEDURE REPORT  PATIENT: Phillip Maldonado, Phillip Maldonado  MR#: [id number] BIRTHDATE: 1950-06-30 , 63  yrs. old GENDER: male ENDOSCOPIST: Gatha Mayer, MD, [facility] PROCEDURE DATE:  06/14/2014 PROCEDURE:  EGD w/ biopsy ASA CLASS:     Class IV INDICATIONS:  f/u prior marginal ulcer. MEDICATIONS: Monitored anesthesia care and Residual sedation present  TOPICAL ANESTHETIC: none  DESCRIPTION OF PROCEDURE: After the risks benefits and alternatives of the procedure were thoroughly explained, informed consent was obtained.  The EC-[id number] ([id number]) endoscope was introduced through the mouth and advanced to the second portion of the duodenum , Without limitations.  The instrument was slowly withdrawn as the mucosa was fully examined.    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  Retroflexed views revealed as previously described.     The scope was then withdrawn from the patient and the procedure completed.  COMPLICATIONS: There were no immediate complications.  ENDOSCOPIC IMPRESSION: 1) S/p revised bariatric surgery - wire and pouch in the anterior proximal stomaxch - could not manipulate scope to see if limb still patent 2) Multiple soft fleshy subcentimeter polyps in the proximal stomach - biopsied - look like fundic gland polyps 3) otherwise normal EGD  RECOMMENDATIONS: 1.  Await pathology results 2.  Proceed with a Colonoscopy.  REPEAT EXAM:  eSigned:  Gatha Mayer, MD, [facility] 06/14/2014 11:25 AM    CC: Elder Negus, MD and The Patient

## 2014-06-14 NOTE — Op Note (Signed)
[facility] [place], [address]   COLONOSCOPY PROCEDURE REPORT  PATIENT: Phillip Maldonado, Phillip Maldonado  MR#: [id number] BIRTHDATE: 1950/04/04 , 63  yrs. old GENDER: male ENDOSCOPIST: Gatha Mayer, MD, [facility] REFERRED BY: PROCEDURE DATE:  06/14/2014 PROCEDURE:   Colonoscopy, screening First Screening Colonoscopy - Avg.  risk and is 50 yrs.  old or older - No.  Prior Negative Screening - Now for repeat screening. 10 or more years since last screening  History of Adenoma - Now for follow-up colonoscopy & has been > or = to 3 yrs.  N/A ASA CLASS:   Class IV INDICATIONS:Screening for colonic neoplasia and Colorectal Neoplasm Risk Assessment for this procedure is average risk. MEDICATIONS: Residual sedation present, Per Anesthesia , and Monitored anesthesia care  DESCRIPTION OF PROCEDURE:   After the risks benefits and alternatives of the procedure were thoroughly explained, informed consent was obtained.  The digital rectal exam revealed no abnormalities of the rectum, revealed no prostatic nodules, and revealed the prostate was not enlarged.   The     endoscope was introduced through the anus and advanced to the ascending colon. No adverse events experienced.   The quality of the prep was good. (MoviPrep was used)  The instrument was then slowly withdrawn as the colon was fully examined.      COLON FINDINGS: 1) Mild left diverticulosis 2) Otherwise normal colon - could see IC valve from ascending but unable to enter cecum despite abdominal pressure and position change.  Redundnat colon and large abdomen.  Retroflexed views revealed no abnormalities. The time to identifying IC valve (ascending colon) = 8.2 Withdrawal time = 11.0   The scope was withdrawn and the procedure completed. COMPLICATIONS: There were no immediate complications.  ENDOSCOPIC IMPRESSION: 1) Mild left diverticulosis 2) Otherwise normal colon - could see IC valve from ascending  but unable to enter cecum despite abdominal pressure and position change.  Redundnat colon and large abdomen  RECOMMENDATIONS: Probably do annual hemoccults starting next year - place a recall for this now. Will discuss.  eSigned:  Gatha Mayer, MD, [facility] 06/14/2014 11:30 AM   cc: Elder Negus, MD and The Patient

## 2014-06-15 ENCOUNTER — Encounter (HOSPITAL_COMMUNITY): Payer: Self-pay | Admitting: Internal Medicine

## 2014-06-17 ENCOUNTER — Encounter: Payer: Self-pay | Admitting: Internal Medicine

## 2014-06-17 DIAGNOSIS — D131 Benign neoplasm of stomach: Secondary | ICD-10-CM

## 2014-06-17 NOTE — Progress Notes (Signed)
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

## 2014-06-20 ENCOUNTER — Ambulatory Visit (INDEPENDENT_AMBULATORY_CARE_PROVIDER_SITE_OTHER): Payer: Medicare Other | Admitting: Internal Medicine

## 2014-06-20 ENCOUNTER — Encounter: Payer: Self-pay | Admitting: Internal Medicine

## 2014-06-20 VITALS — BP 138/86 | HR 77 | Temp 98.2°F | Ht 68.0 in | Wt >= 6400 oz

## 2014-06-20 DIAGNOSIS — G894 Chronic pain syndrome: Secondary | ICD-10-CM

## 2014-06-20 DIAGNOSIS — R609 Edema, unspecified: Secondary | ICD-10-CM | POA: Diagnosis not present

## 2014-06-20 DIAGNOSIS — I10 Essential (primary) hypertension: Secondary | ICD-10-CM | POA: Diagnosis not present

## 2014-06-20 MED ORDER — GABAPENTIN 800 MG PO TABS: ORAL_TABLET | ORAL | Status: DC

## 2014-06-20 NOTE — Progress Notes (Signed)
Patient ID: Phillip Maldonado, male   DOB: 1951-02-10, 64 y.o.   MRN: [id number]    Facility  [facility]    Place of Service:   OFFICE   Allergies  Allergen Reactions  . Adhesive [Tape] Other (See Comments)    Unknown paper tape ok to use   . Feldene [Piroxicam] Other (See Comments)    unknown  . Latex Other (See Comments)    Rash and itching    Chief Complaint  Patient presents with  . Acute Visit    Complains of Left Leg Swelling    HPI:  Essential hypertension: controlled  Edema: Has not reduced the gabapentin. Was up on his feet a lot yesterday. Edema looks much the same as it has in the past. There is some bubbling up of the skin but without obvious blister formation. There is a gross erythema of both lower legs related to his venous insufficiency. I see no indication of cellulitis.  Chronic pain syndrome: was unable to reduce gabapentin.  Chronic low back pain. Patient has had a Comptroller recommended in the past. It requires neurosurgical placement in the spinal cord area. Cost factors have been prohibitive in the past. He is considering seeing another neurosurgeon about this device again.    Medications: Patient's Medications  New Prescriptions   No medications on file  Previous Medications   ACETAMINOPHEN (TYLENOL) 650 MG CR TABLET    Take 1,950 mg by mouth 2 (two) times daily.   AMLODIPINE (NORVASC) 10 MG TABLET    TAKE 1 TABLET DAILY.   ATORVASTATIN (LIPITOR) 40 MG TABLET    take 1 tablet once daily   DOCUSATE SODIUM (COLACE) 100 MG CAPSULE    Take 200 mg by mouth 2 (two) times daily as needed for mild constipation.    DULOXETINE (CYMBALTA) 60 MG CAPSULE    One twice daily to help neuropathy   GABAPENTIN (NEURONTIN) 800 MG TABLET    Take 1 tablet (800 mg total) by mouth daily.   GLUCOSE BLOOD (ACCU-CHEK AVIVA PLUS) TEST STRIP    1 each by Other route See admin instructions. Check blood sugar twice daily   INSULIN GLARGINE (LANTUS SOLOSTAR) 100  UNIT/ML SOLOSTAR PEN    INJECT 18 UNITS EVERY MORNING   INSULIN PEN NEEDLE (NOVOFINE) 30G X 8 MM MISC    Inject 1 packet into the skin See admin instructions. Use with insulin once daily.   LISINOPRIL (PRINIVIL,ZESTRIL) 20 MG TABLET    One daily to control BP and strengthen the heart   LUBIPROSTONE (AMITIZA) 24 MCG CAPSULE    Take 1 capsule (24 mcg total) by mouth 2 (two) times daily with a meal.   METFORMIN (GLUCOPHAGE) 1000 MG TABLET    Take 1,000 mg by mouth 2 (two) times daily with a meal.   METHADONE (DOLOPHINE) 10 MG TABLET    Take 1 tablet (10 mg total) by mouth 4 (four) times daily.   METOPROLOL (LOPRESSOR) 50 MG TABLET    TAKE ONE TABLET TWICE DAILY   MULTIPLE VITAMIN (MULITIVITAMIN WITH MINERALS) TABS    Take 1 tablet by mouth daily.   NYSTATIN-TRIAMCINOLONE (MYCOLOG II) CREAM    Apply sparingly to rash twice daily   PANTOPRAZOLE (PROTONIX) 40 MG TABLET    TAKE 1 TABLET TWO TIMES DAILY.   PSYLLIUM (METAMUCIL) 58.6 % PACKET    Take 1 packet by mouth daily.   SPIRONOLACTONE (ALDACTONE) 50 MG TABLET    Take 50 mg by mouth daily.  TORSEMIDE (DEMADEX) 100 MG TABLET    One twice daily for diuretic   TRAZODONE (DESYREL) 150 MG TABLET    Take 2 tablets (300 mg total) by mouth at bedtime. 2 by mouth at bedtime  Modified Medications   No medications on file  Discontinued Medications   No medications on file     Review of Systems  Constitutional: Negative for fever, activity change, appetite change, fatigue and unexpected weight change.       Morbidly obese  HENT: Negative for congestion, ear pain, hearing loss, rhinorrhea, sore throat, tinnitus, trouble swallowing and voice change.   Eyes:       Corrective lenses  Respiratory: Negative for cough, choking, chest tightness, shortness of breath and wheezing.   Cardiovascular: Positive for leg swelling. Negative for chest pain and palpitations.  Gastrointestinal: Negative for nausea, abdominal pain, diarrhea, constipation and abdominal  distention.  Endocrine: Negative for cold intolerance, heat intolerance, polydipsia, polyphagia and polyuria.  Genitourinary: Negative for dysuria, urgency, frequency and testicular pain.       Not incontinent  Musculoskeletal: Negative for myalgias, back pain, arthralgias, gait problem and neck pain.  Skin: Positive for rash.       erythema of the anterior shins. History of intertrigo under the breasts and in the groin area bilaterally. There is some burning discomfort. It has improved. Blotchy, red facial rash present since Spring 2015. Chronic venous stasis changes of both lower legs.  Allergic/Immunologic: Negative.   Neurological: Negative for dizziness, tremors, syncope, speech difficulty, weakness, numbness and headaches.       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  Hematological: Negative for adenopathy. Does not bruise/bleed easily.  Psychiatric/Behavioral: Negative for hallucinations, behavioral problems, confusion, sleep disturbance and decreased concentration. The patient is not nervous/anxious.     Filed Vitals:   06/20/14 1150  BP: 138/86  Pulse: 77  Temp: 98.2 F (36.8 C)  TempSrc: Oral  Height: 5\' 8"  (1.727 m)  Weight: 400 lb 3.2 oz (181.53 kg)   Body mass index is 60.86 kg/(m^2).  Physical Exam  Constitutional: He is oriented to person, place, and time. He appears well-developed and well-nourished. No distress.  Morbid obesity  HENT:  Right Ear: External ear normal.  Left Ear: External ear normal.  Nose: Nose normal.  Mouth/Throat: Oropharynx is clear and moist. No oropharyngeal exudate.  Eyes: Conjunctivae and EOM are normal. Pupils are equal, round, and reactive to light.  Neck: No JVD present. No tracheal deviation present. No thyromegaly present.  Cardiovascular: Normal rate, regular rhythm, normal heart sounds and intact distal pulses.  Exam reveals no gallop and no  friction rub.   No murmur heard. Pulmonary/Chest: No respiratory distress. He has no wheezes. He has no rales. He exhibits no tenderness.  Abdominal: He exhibits no distension and no mass. There is no tenderness.  Anasarca with intra-abdominal fluid.  Musculoskeletal: He exhibits edema. He exhibits no tenderness.  Using cane. Unstable gait.  Lymphadenopathy:    He has no cervical adenopathy.  Neurological: He is alert and oriented to person, place, and time. He has normal reflexes. No cranial nerve deficit. Coordination normal.  Skin: Rash noted. There is erythema (both shins). No pallor.  Chronic venous stasis changes of both anterior legs. Erythematous intertrigo under the breasts and in the groin area bilaterally. Diffuse, blotchy facial rash. Blotches are about the size of the end of my finger. It does not  seem to involve the scalp, but does cover the forehead and cheeks, chin, and anterior neck.   Psychiatric: He has a normal mood and affect. His behavior is normal. Judgment and thought content normal.     Labs reviewed: Admission on 06/14/2014, Discharged on 06/14/2014  Component Date Value Ref Range Status  . Glucose-Capillary 06/14/2014 158* 70 - 99 mg/dL Final  Office Visit on 05/16/2014  Component Date Value Ref Range Status  . Glucose 05/16/2014 192* 65 - 99 mg/dL Final  . BUN 05/16/2014 30* 8 - 27 mg/dL Final  . Creatinine, Ser 05/16/2014 1.69* 0.76 - 1.27 mg/dL Final  . GFR calc non Af Amer 05/16/2014 42* >59 mL/min/1.73 Final  . GFR calc Af Amer 05/16/2014 49* >59 mL/min/1.73 Final  . BUN/Creatinine Ratio 05/16/2014 18  10 - 22 Final  . Sodium 05/16/2014 138  134 - 144 mmol/L Final  . Potassium 05/16/2014 4.8  3.5 - 5.2 mmol/L Final  . Chloride 05/16/2014 93* 97 - 108 mmol/L Final  . CO2 05/16/2014 23  18 - 29 mmol/L Final  . Calcium 05/16/2014 9.5  8.6 - 10.2 mg/dL Final  . Total Protein 05/16/2014 7.1  6.0 - 8.5 g/dL Final  . Albumin 05/16/2014 4.0  3.6 - 4.8 g/dL  Final  . Globulin, Total 05/16/2014 3.1  1.5 - 4.5 g/dL Final  . Albumin/Globulin Ratio 05/16/2014 1.3  1.1 - 2.5 Final  . Bilirubin Total 05/16/2014 0.4  0.0 - 1.2 mg/dL Final  . Alkaline Phosphatase 05/16/2014 57  39 - 117 IU/L Final  . AST 05/16/2014 43* 0 - 40 IU/L Final  . ALT 05/16/2014 30  0 - 44 IU/L Final  . Hgb A1c MFr Bld 05/16/2014 9.0* 4.8 - 5.6 % Final   Comment:          Pre-diabetes: 5.7 - 6.4          Diabetes: >6.4          Glycemic control for adults with diabetes: <7.0   . Est. average glucose Bld gHb Est-m* 05/16/2014 212   Final  . WBC 05/16/2014 8.8  3.4 - 10.8 x10E3/uL Final  . RBC 05/16/2014 4.57  4.14 - 5.80 x10E6/uL Final  . Hemoglobin 05/16/2014 12.0* 12.6 - 17.7 g/dL Final  . HCT 05/16/2014 37.3* 37.5 - 51.0 % Final  . MCV 05/16/2014 82  79 - 97 fL Final  . MCH 05/16/2014 26.3* 26.6 - 33.0 pg Final  . MCHC 05/16/2014 32.2  31.5 - 35.7 g/dL Final  . RDW 05/16/2014 18.3* 12.3 - 15.4 % Final  . Neutrophils Relative % 05/16/2014 60   Final  . Lymphs 05/16/2014 29   Final  . Monocytes 05/16/2014 7   Final  . Eos 05/16/2014 4   Final  . Basos 05/16/2014 0   Final  . Neutrophils Absolute 05/16/2014 5.2  1.4 - 7.0 x10E3/uL Final  . Lymphocytes Absolute 05/16/2014 2.6  0.7 - 3.1 x10E3/uL Final  . Monocytes Absolute 05/16/2014 0.6  0.1 - 0.9 x10E3/uL Final  . Eosinophils Absolute 05/16/2014 0.3  0.0 - 0.4 x10E3/uL Final  . Basophils Absolute 05/16/2014 0.0  0.0 - 0.2 x10E3/uL Final  . Immature Granulocytes 05/16/2014 0   Final  . Immature Grans (Abs) 05/16/2014 0.0  0.0 - 0.1 x10E3/uL Final  . Vitamin B-12 05/16/2014 681  211 - 946 pg/mL Final     Assessment/Plan  1. Essential hypertension controlled  2. Edema Stop amlodipine. Reduce gabapentin. Advised patient that I would be  willing to put on Profore dressing or Unna boot if he will bring his supplies to the office.  3. Chronic pain syndrome - gabapentin (NEURONTIN) 800 MG tablet; Take 2 tablets 3  times daily to help neuropathy  Dispense: 180 tablet; Refill: 3  Advised patient that if he wants [place] to refer him to a neurosurgeon regarding vertebral column stimulator placement that he should get back to [place].

## 2014-06-20 NOTE — Patient Instructions (Signed)
Avoid salt in your diet. Elevate legs when possible. Stop amlodipine.

## 2014-06-21 ENCOUNTER — Telehealth: Payer: Self-pay | Admitting: Internal Medicine

## 2014-06-21 NOTE — Telephone Encounter (Signed)
Patient said last night he had choking feeling and some bloating and now is nauseated.  He has some continued nausea today.  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   I started to explain that this was from the bypass and that the staples can't be removed.  He said "your not telling me anything I don't already know".  I asked if he still had the choking feeling and before I could finish the sentence he hung up.

## 2014-06-22 NOTE — Telephone Encounter (Signed)
He would need to see surgeon about revising again which I doubt they would do He can call back if these sxs do not subside and I can investigate further but I suspect these are chronic recurrent sxs

## 2014-06-22 NOTE — Telephone Encounter (Signed)
I left the patient a detailed message to call back if his symptoms do not subside to set up a follow up appt with Dr. Carlean Purl

## 2014-07-13 ENCOUNTER — Ambulatory Visit (INDEPENDENT_AMBULATORY_CARE_PROVIDER_SITE_OTHER): Payer: Medicare Other | Admitting: Internal Medicine

## 2014-07-13 ENCOUNTER — Encounter: Payer: Self-pay | Admitting: Internal Medicine

## 2014-07-13 VITALS — BP 146/88 | HR 78 | Temp 97.7°F | Ht 69.0 in | Wt >= 6400 oz

## 2014-07-13 DIAGNOSIS — R238 Other skin changes: Secondary | ICD-10-CM

## 2014-07-13 DIAGNOSIS — R600 Localized edema: Secondary | ICD-10-CM | POA: Diagnosis not present

## 2014-07-13 NOTE — Progress Notes (Addendum)
Patient ID: Phillip Maldonado, male   DOB: 17-Jul-1950, 64 y.o.   MRN: [id number]   Location:  [facility] / [facility]  Code Status: full code   Allergies  Allergen Reactions  . Adhesive [Tape] Other (See Comments)    Unknown paper tape ok to use   . Feldene [Piroxicam] Other (See Comments)    unknown  . Latex Other (See Comments)    Rash and itching    Chief Complaint  Patient presents with  . Acute Visit    Bilateral leg swelling, blisters and burning. Left leg is worse   . Hand Problem    Examine pointer finger on right hand-? infection     HPI: Patient is a 64 y.o. male with h/o CHF, diabetes and some chronic peripheral edema seen in the office today for an acute visit due to concerns about bilateral leg swelling, blisters, burning worse on left leg than right AND hand problem with possible infection on second finger of right hand.  About 6 years ago, had edema of legs, cleared up for a year or so, then last year resumed "hard and heavy".  Had unnaboots twice a week for a long time.  Right leg stays normal in sized, but gets blisters--cannot resist scratching when they burn and hurt.  Left leg swollen, but it's been bigger in the past.  He's had at least three prior venous doppler studies at [facility] all negative for DVT.  I was unable to locate any arterial studies.  His echo showed normal EF, mild valvular disease, did not mention diastolic function.  Has right second finger pearly blue to black papule near his fingernail.    Notes forgetfulness.  Says he'll be in the middle of conversation, and forget what he's saying.  Starts stumbling for words.  Review of Systems:  Review of Systems  Constitutional: Positive for malaise/fatigue.  HENT: Negative for congestion.   Respiratory: Positive for shortness of breath.        Deconditioning  Cardiovascular: Positive for leg swelling. Negative for palpitations, orthopnea and PND.    Gastrointestinal: Positive for heartburn.  Genitourinary: Negative for dysuria.  Musculoskeletal: Negative for falls.     Past Medical History  Diagnosis Date  . CHF (congestive heart failure)   . Hypertension   . Anemia   . Angina   . Shortness of breath   . Anxiety   . Blood transfusion     last 10' 14- "GI bleed"  . Headache(784.0)   . Depression   . Hepatitis B   . Neuromuscular disorder   . Arthritis   . Diabetes mellitus 09/18/2011  . Atrial fibrillation with RVR 01/24/2013  . Renal insufficiency   . DJD (degenerative joint disease)   . History of alcohol abuse     last drink in 1993  . Obesity   . Diverticulosis   . Fatty liver   . Sleep apnea, obstructive     does where bipap. 06-04-14 "doesn't use much now, no mask/tubing now.  . Pneumonia     not at present time  . Edema extremities     bilateral lower extremities-"weepy areas due to fluid retention"  . Fundic gland polyps of stomach, benign     Past Surgical History  Procedure Laterality Date  . Gastric bypass  1977    Reversed in 1992 and revision 1994  . Diagnostic laparoscopy    . Esophagoscopy N/A 01/01/2013    Procedure: ESOPHAGOSCOPY;  Surgeon: Jeryl Columbia, MD;  Location: [facility];  Service: Endoscopy;  Laterality: N/A;  . Esophagogastroduodenoscopy N/A 01/03/2013    Procedure: ESOPHAGOGASTRODUODENOSCOPY (EGD);  Surgeon: Arta Silence, MD;  Location: [facility];  Service: Endoscopy;  Laterality: N/A;  . Esophagogastroduodenoscopy N/A 01/03/2013    Procedure: ESOPHAGOGASTRODUODENOSCOPY (EGD);  Surgeon: Arta Silence, MD;  Location: [facility];  Service: Endoscopy;  Laterality: N/A;  . Esophagogastroduodenoscopy N/A 01/23/2013    Procedure: ESOPHAGOGASTRODUODENOSCOPY (EGD);  Surgeon: Lear Ng, MD;  Location: [facility];  Service: Endoscopy;  Laterality: N/A;  . Esophagogastroduodenoscopy Left 01/27/2013    Procedure: ESOPHAGOGASTRODUODENOSCOPY (EGD);  Surgeon: Lear Ng, MD;   Location: [place];  Service: Endoscopy;  Laterality: Left;  . Knee surgery Left 2004  . Hydrocele excision  1996    x 2   . Abdominal surgery      [phone]  . Colonoscopy    . Back surgery      x 8 -,multiple fusions(cervical to lumbar)  . Colonoscopy with propofol N/A 06/14/2014    Procedure: COLONOSCOPY WITH PROPOFOL;  Surgeon: Gatha Mayer, MD;  Location: [facility];  Service: Endoscopy;  Laterality: N/A;  . Esophagogastroduodenoscopy N/A 06/14/2014    Procedure: ESOPHAGOGASTRODUODENOSCOPY (EGD);  Surgeon: Gatha Mayer, MD;  Location: [facility];  Service: Endoscopy;  Laterality: N/A;    Social History:   reports that he has never smoked. He does not have any smokeless tobacco history on file. He reports that he does not drink alcohol or use illicit drugs.  Family History  Problem Relation Age of Onset  . Heart disease Father   . Skin cancer Father   . COPD Father   . Hypertension Father   . Heart disease Maternal Uncle   . High blood pressure Sister   . High blood pressure Sister   . High blood pressure Son   . Diabetes Daughter   . Depression Son   . Depression Son   . Stroke Father   . Arthritis Mother   . Arthritis Sister   . Arthritis Sister   . Osteoporosis Mother   . Cancer Maternal Grandmother   . Heart disease Maternal Uncle   . Heart disease Paternal Uncle     Medications: Patient's Medications  New Prescriptions   No medications on file  Previous Medications   ACETAMINOPHEN (TYLENOL) 650 MG CR TABLET    Take 1,950 mg by mouth 2 (two) times daily.   ATORVASTATIN (LIPITOR) 40 MG TABLET    take 1 tablet once daily   DOCUSATE SODIUM (COLACE) 100 MG CAPSULE    Take 200 mg by mouth 2 (two) times daily as needed for mild constipation.    DULOXETINE (CYMBALTA) 60 MG CAPSULE    One twice daily to help neuropathy   GABAPENTIN (NEURONTIN) 800 MG TABLET    Take 2 tablets 3 times daily to help neuropathy   GLUCOSE BLOOD (ACCU-CHEK AVIVA PLUS) TEST STRIP     1 each by Other route See admin instructions. Check blood sugar twice daily   INSULIN GLARGINE (LANTUS SOLOSTAR) 100 UNIT/ML SOLOSTAR PEN    INJECT 18 UNITS EVERY MORNING   INSULIN PEN NEEDLE (NOVOFINE) 30G X 8 MM MISC    Inject 1 packet into the skin See admin instructions. Use with insulin once daily.   LISINOPRIL (PRINIVIL,ZESTRIL) 20 MG TABLET    One daily to control BP and strengthen the heart   LUBIPROSTONE (AMITIZA) 24 MCG CAPSULE    Take 1 capsule (24  mcg total) by mouth 2 (two) times daily with a meal.   METFORMIN (GLUCOPHAGE) 1000 MG TABLET    Take 1,000 mg by mouth 2 (two) times daily with a meal.   METHADONE (DOLOPHINE) 10 MG TABLET    Take 1 tablet (10 mg total) by mouth 4 (four) times daily.   METOPROLOL (LOPRESSOR) 50 MG TABLET    TAKE ONE TABLET TWICE DAILY   MULTIPLE VITAMIN (MULITIVITAMIN WITH MINERALS) TABS    Take 1 tablet by mouth daily.   NYSTATIN-TRIAMCINOLONE (MYCOLOG II) CREAM    Apply sparingly to rash twice daily   PANTOPRAZOLE (PROTONIX) 40 MG TABLET    TAKE 1 TABLET TWO TIMES DAILY.   PSYLLIUM (METAMUCIL) 58.6 % PACKET    Take 1 packet by mouth daily.   SPIRONOLACTONE (ALDACTONE) 50 MG TABLET    Take 50 mg by mouth daily.   TORSEMIDE (DEMADEX) 100 MG TABLET    One twice daily for diuretic   TRAZODONE (DESYREL) 150 MG TABLET    Take 2 tablets (300 mg total) by mouth at bedtime. 2 by mouth at bedtime  Modified Medications   No medications on file  Discontinued Medications   No medications on file     Physical Exam: Filed Vitals:   07/13/14 0852  BP: 146/88  Pulse: 78  Temp: 97.7 F (36.5 C)  TempSrc: Oral  Height: 5\' 9"  (1.753 m)  Weight: 400 lb 3.2 oz (181.53 kg)  SpO2: 93%  Physical Exam  Constitutional:  obese white male walks with cane  Cardiovascular: Normal rate, regular rhythm, normal heart sounds and intact distal pulses.   Dorsalis pedis palpable  Pulmonary/Chest: Effort normal and breath sounds normal.  Abdominal: Bowel sounds are normal.    Skin:  Bilateral lower extremities with nonpitting edema, chronic venous stasis changes with hyperpigmentation of lower legs, shins; right leg with superficial blistering on lateral shin region; thick toenails; no open areas on feet;  Right second finger with pearly bluish black papule near corner of nailbed    Labs reviewed: Basic Metabolic Panel:  Recent Labs  09/16/13 0730  12/26/13 1124 03/14/14 1458 05/16/14 1305  NA  --   < > 137 137 138  K  --   < > 4.4 5.3* 4.8  CL  --   < > 92* 90* 93*  CO2  --   < > 26 24 23   GLUCOSE  --   < > 212* 223* 192*  BUN  --   < > 15 26 30*  CREATININE  --   < > 1.05 1.51* 1.69*  CALCIUM  --   < > 9.6 9.8 9.5  TSH 4.000  --  1.470 2.530  --   < > = values in this interval not displayed. Liver Function Tests:  Recent Labs  09/15/13 1618 09/16/13 0243  02/28/14 1117 03/14/14 1458 05/16/14 1305  AST 32 33  < > 49* 64* 43*  ALT 27 23  < > 31 37 30  ALKPHOS 66 59  < > 53 62 57  BILITOT 0.5 0.5  < > 0.5 0.4 0.4  PROT 7.9 7.3  < > 7.4 7.5 7.1  ALBUMIN 4.0 3.7  --  3.7  --   --   < > = values in this interval not displayed.  Recent Labs  09/15/13 1618  LIPASE 25   No results for input(s): AMMONIA in the last 8760 hours. CBC:  Recent Labs  09/16/13 0243 09/17/13 0415 12/07/13 1222  12/26/13 1124 05/16/14 1305  WBC 9.1 8.2 7.9 7.0 8.8  NEUTROABS 5.4  --   --  4.8 5.2  HGB 10.6* 9.8* 11.2* 11.0* 12.0*  HCT 35.9* 34.3* 37.0* 35.7* 37.3*  MCV 75.6* 76.1* 76.6* 77* 82  PLT 294 260 258  --   --    Lipid Panel:  Recent Labs  12/26/13 1124  CHOL 153  HDL 45  LDLCALC 67  TRIG 206*  CHOLHDL 3.4   Lab Results  Component Value Date   HGBA1C 9.0* 05/16/2014     Assessment/Plan 1. Bilateral lower extremity edema -assess ABIs and arterial dopplers b/c I cannot find where this was ever done; however, his pulses are intact -after staff wrapped right leg with profore lite, pt noted he could not wear his boat shoes and it had  to be removed again--legs were then wrapped only to ankles but pt advised to remove them if swelling of the feet occurs -he should have diabetic shoes -will try to find out where he can get the blue sandal orthotic shoe so his legs can be wrapped appropriately as pressure is the best solution to his venous insufficiency problem--it appears [facility] has one of these  2. Papule of skin -On finger--may be basal cell based on appearance, but unusual location -may refer to dermatology when returns  Labs/tests ordered:   Orders Placed This Encounter  Procedures  . Lower Extremity Arterial Doppler Bilateral    With ABIs. To assess arterial circulation before applying unnaboots for venous insufficiency.    Standing Status: Future     Number of Occurrences:      Standing Expiration Date: 07/13/2015    Order Specific Question:  Laterality    Answer:  Bilateral    Order Specific Question:  Where should this test be performed:    Answer:  [facility]    Next appt:  About 1 wk, keep appt with Dr. Nyoka Cowden to check mmse, etc.  Petra Sargeant L. Caila Cirelli, D.O. [facility] [address], [place] [address] Cell Phone (Mon-Fri 8am-5pm):  [phone] On Call:  [phone] & follow prompts after 5pm & weekends Office Phone:  [phone] Office Fax:  [phone]    01/15/16 Late entry. Questions arose regarding what kind of depression this patient has. 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  Although I did not see the patient on this particular visit, I have seen him on several other occassions , which gives me insight into his depression. This is written to clarify the clinical documentation of 07/13/14 to accurately reflect the patient's condition and severity of illness.  Jeanmarie Hubert, MD

## 2014-07-24 ENCOUNTER — Encounter: Payer: Self-pay | Admitting: Internal Medicine

## 2014-07-24 ENCOUNTER — Ambulatory Visit (INDEPENDENT_AMBULATORY_CARE_PROVIDER_SITE_OTHER): Payer: Medicare Other | Admitting: Internal Medicine

## 2014-07-24 VITALS — BP 110/68 | HR 78 | Temp 97.8°F | Resp 20 | Ht 69.0 in | Wt 396.4 lb

## 2014-07-24 DIAGNOSIS — L97921 Non-pressure chronic ulcer of unspecified part of left lower leg limited to breakdown of skin: Secondary | ICD-10-CM | POA: Diagnosis not present

## 2014-07-24 DIAGNOSIS — R609 Edema, unspecified: Secondary | ICD-10-CM

## 2014-07-24 DIAGNOSIS — E1165 Type 2 diabetes mellitus with hyperglycemia: Secondary | ICD-10-CM | POA: Diagnosis not present

## 2014-07-24 DIAGNOSIS — E1140 Type 2 diabetes mellitus with diabetic neuropathy, unspecified: Secondary | ICD-10-CM

## 2014-07-24 DIAGNOSIS — S81811D Laceration without foreign body, right lower leg, subsequent encounter: Secondary | ICD-10-CM

## 2014-07-24 DIAGNOSIS — I10 Essential (primary) hypertension: Secondary | ICD-10-CM | POA: Diagnosis not present

## 2014-07-24 DIAGNOSIS — IMO0002 Reserved for concepts with insufficient information to code with codable children: Secondary | ICD-10-CM

## 2014-07-24 DIAGNOSIS — L97929 Non-pressure chronic ulcer of unspecified part of left lower leg with unspecified severity: Secondary | ICD-10-CM | POA: Insufficient documentation

## 2014-07-24 DIAGNOSIS — S81819A Laceration without foreign body, unspecified lower leg, initial encounter: Secondary | ICD-10-CM | POA: Insufficient documentation

## 2014-07-24 MED ORDER — SILVER SULFADIAZINE 1 % EX CREA: TOPICAL_CREAM | CUTANEOUS | Status: DC

## 2014-07-24 NOTE — Progress Notes (Signed)
Patient ID: Phillip Maldonado, male   DOB: [birth year], 64 y.o.   MRN: [id number]    Facility  [facility]    Place of Service:   OFFICE    Allergies  Allergen Reactions  . Adhesive [Tape] Other (See Comments)    Unknown paper tape ok to use   . Feldene [Piroxicam] Other (See Comments)    unknown  . Latex Other (See Comments)    Rash and itching    Chief Complaint  Patient presents with  . Acute Visit    Patient c/o Leg infection    HPI:  Skin tear of lower leg without complication, right, subsequent encounter: Due to chronic edema of the legs this area is weeping continually. There is no odor or erythema. Is not painful.  Ulcer of left lower leg, limited to breakdown of skin: Has chronic blistering of the lower legs due to excessive edema. A small area has opened up. No odor or erythema.  Edema: Chronic problem related to fluid overload and venous insufficiency.  Essential hypertension: Controlled  DM type 2, uncontrolled, with neuropathy: Controlled  Dr. Mariea Clonts previously noted a black papule at the end of the right second finger which is blue-black in origin. This area ruptured and released a small amount of blood and purulent material and appears to be healing at present time. It does not look like melanoma.   Medications: Patient's Medications  New Prescriptions   No medications on file  Previous Medications   ACETAMINOPHEN (TYLENOL) 650 MG CR TABLET    Take 1,950 mg by mouth 2 (two) times daily.   ATORVASTATIN (LIPITOR) 40 MG TABLET    take 1 tablet once daily   DOCUSATE SODIUM (COLACE) 100 MG CAPSULE    Take 200 mg by mouth 2 (two) times daily as needed for mild constipation.    DULOXETINE (CYMBALTA) 60 MG CAPSULE    One twice daily to help neuropathy   GABAPENTIN (NEURONTIN) 800 MG TABLET    Take 2 tablets 3 times daily to help neuropathy   GLUCOSE BLOOD (ACCU-CHEK AVIVA PLUS) TEST STRIP    1 each by Other route See admin instructions. Check blood sugar twice daily   INSULIN GLARGINE (LANTUS SOLOSTAR) 100 UNIT/ML SOLOSTAR PEN    INJECT 18 UNITS EVERY MORNING   INSULIN PEN NEEDLE (NOVOFINE) 30G X 8 MM MISC    Inject 1 packet into the skin See admin instructions. Use with insulin once daily.   LISINOPRIL (PRINIVIL,ZESTRIL) 20 MG TABLET    One daily to control BP and strengthen the heart   LUBIPROSTONE (AMITIZA) 24 MCG CAPSULE    Take 1 capsule (24 mcg total) by mouth 2 (two) times daily with a meal.   METFORMIN (GLUCOPHAGE) 1000 MG TABLET    Take 1,000 mg by mouth 2 (two) times daily with a meal.   METHADONE (DOLOPHINE) 10 MG TABLET    Take 1 tablet (10 mg total) by mouth 4 (four) times daily.   METOPROLOL (LOPRESSOR) 50 MG TABLET    TAKE ONE TABLET TWICE DAILY   MULTIPLE VITAMIN (MULITIVITAMIN WITH MINERALS) TABS    Take 1 tablet by mouth daily.   NYSTATIN-TRIAMCINOLONE (MYCOLOG II) CREAM    Apply sparingly to rash twice daily   PANTOPRAZOLE (PROTONIX) 40 MG TABLET    TAKE 1 TABLET TWO TIMES DAILY.   PSYLLIUM (METAMUCIL) 58.6 % PACKET    Take 1 packet by mouth daily.   SPIRONOLACTONE (ALDACTONE) 50 MG TABLET    Take 50  mg by mouth daily.   TORSEMIDE (DEMADEX) 100 MG TABLET    One twice daily for diuretic   TRAZODONE (DESYREL) 150 MG TABLET    Take 2 tablets (300 mg total) by mouth at bedtime. 2 by mouth at bedtime  Modified Medications   No medications on file  Discontinued Medications   No medications on file     Review of Systems  Constitutional: Negative for fever, activity change, appetite change, fatigue and unexpected weight change.       Morbidly obese  HENT: Negative for congestion, ear pain, hearing loss, rhinorrhea, sore throat, tinnitus, trouble swallowing and voice change.   Eyes:       Corrective lenses  Respiratory: Negative for cough, choking, chest tightness, shortness of breath and wheezing.   Cardiovascular: Positive for leg swelling. Negative for chest pain and palpitations.  Gastrointestinal: Negative for nausea, abdominal pain,  diarrhea, constipation and abdominal distention.  Endocrine: Negative for cold intolerance, heat intolerance, polydipsia, polyphagia and polyuria.  Genitourinary: Negative for dysuria, urgency, frequency and testicular pain.       Not incontinent  Musculoskeletal: Negative for myalgias, back pain, arthralgias, gait problem and neck pain.  Skin: Positive for rash.       erythema of the anterior shins. History of intertrigo under the breasts and in the groin area bilaterally. There is some burning discomfort. It has improved. Blotchy, red facial rash present since Spring 2015. Chronic venous stasis changes of both lower legs.  Allergic/Immunologic: Negative.   Neurological: Negative for dizziness, tremors, syncope, speech difficulty, weakness, numbness and headaches.       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  Hematological: Negative for adenopathy. Does not bruise/bleed easily.  Psychiatric/Behavioral: Negative for hallucinations, behavioral problems, confusion, sleep disturbance and decreased concentration. The patient is not nervous/anxious.     Filed Vitals:   07/24/14 1501  BP: 110/68  Pulse: 78  Temp: 97.8 F (36.6 C)  TempSrc: Oral  Resp: 20  Height: 5\' 9"  (1.753 m)  Weight: 396 lb 6.4 oz (179.806 kg)  SpO2: 93%   Body mass index is 58.51 kg/(m^2).  Physical Exam  Constitutional: He is oriented to person, place, and time. He appears well-developed and well-nourished. No distress.  Morbid obesity  HENT:  Right Ear: External ear normal.  Left Ear: External ear normal.  Nose: Nose normal.  Mouth/Throat: Oropharynx is clear and moist. No oropharyngeal exudate.  Eyes: Conjunctivae and EOM are normal. Pupils are equal, round, and reactive to light.  Neck: No JVD present. No tracheal deviation present. No thyromegaly present.  Cardiovascular: Normal rate, regular rhythm, normal heart sounds and  intact distal pulses.  Exam reveals no gallop and no friction rub.   No murmur heard. Pulmonary/Chest: No respiratory distress. He has no wheezes. He has no rales. He exhibits no tenderness.  Abdominal: He exhibits no distension and no mass. There is no tenderness.  Anasarca with intra-abdominal fluid.  Musculoskeletal: He exhibits edema. He exhibits no tenderness.  Using cane. Unstable gait.  Lymphadenopathy:    He has no cervical adenopathy.  Neurological: He is alert and oriented to person, place, and time. He has normal reflexes. No cranial nerve deficit. Coordination normal.  Skin: Rash noted. There is erythema (both shins). No pallor.  Chronic venous stasis changes of both anterior legs. Erythematous intertrigo under the breasts and in the groin area bilaterally. Diffuse, blotchy facial rash. Blotches are  about the size of the end of my finger. It does not seem to involve the scalp, but does cover the forehead and cheeks, chin, and anterior neck. Skin tear of the right anterior shin. Open ulceration of the left anterior shin.  Psychiatric: He has a normal mood and affect. His behavior is normal. Judgment and thought content normal.     Labs reviewed: Admission on 06/14/2014, Discharged on 06/14/2014  Component Date Value Ref Range Status  . Glucose-Capillary 06/14/2014 158* 70 - 99 mg/dL Final  Office Visit on 05/16/2014  Component Date Value Ref Range Status  . Glucose 05/16/2014 192* 65 - 99 mg/dL Final  . BUN 05/16/2014 30* 8 - 27 mg/dL Final  . Creatinine, Ser 05/16/2014 1.69* 0.76 - 1.27 mg/dL Final  . GFR calc non Af Amer 05/16/2014 42* >59 mL/min/1.73 Final  . GFR calc Af Amer 05/16/2014 49* >59 mL/min/1.73 Final  . BUN/Creatinine Ratio 05/16/2014 18  10 - 22 Final  . Sodium 05/16/2014 138  134 - 144 mmol/L Final  . Potassium 05/16/2014 4.8  3.5 - 5.2 mmol/L Final  . Chloride 05/16/2014 93* 97 - 108 mmol/L Final  . CO2 05/16/2014 23  18 - 29 mmol/L Final  . Calcium  05/16/2014 9.5  8.6 - 10.2 mg/dL Final  . Total Protein 05/16/2014 7.1  6.0 - 8.5 g/dL Final  . Albumin 05/16/2014 4.0  3.6 - 4.8 g/dL Final  . Globulin, Total 05/16/2014 3.1  1.5 - 4.5 g/dL Final  . Albumin/Globulin Ratio 05/16/2014 1.3  1.1 - 2.5 Final  . Bilirubin Total 05/16/2014 0.4  0.0 - 1.2 mg/dL Final  . Alkaline Phosphatase 05/16/2014 57  39 - 117 IU/L Final  . AST 05/16/2014 43* 0 - 40 IU/L Final  . ALT 05/16/2014 30  0 - 44 IU/L Final  . Hgb A1c MFr Bld 05/16/2014 9.0* 4.8 - 5.6 % Final   Comment:          Pre-diabetes: 5.7 - 6.4          Diabetes: >6.4          Glycemic control for adults with diabetes: <7.0   . Est. average glucose Bld gHb Est-m* 05/16/2014 212   Final  . WBC 05/16/2014 8.8  3.4 - 10.8 x10E3/uL Final  . RBC 05/16/2014 4.57  4.14 - 5.80 x10E6/uL Final  . Hemoglobin 05/16/2014 12.0* 12.6 - 17.7 g/dL Final  . HCT 05/16/2014 37.3* 37.5 - 51.0 % Final  . MCV 05/16/2014 82  79 - 97 fL Final  . MCH 05/16/2014 26.3* 26.6 - 33.0 pg Final  . MCHC 05/16/2014 32.2  31.5 - 35.7 g/dL Final  . RDW 05/16/2014 18.3* 12.3 - 15.4 % Final  . Neutrophils Relative % 05/16/2014 60   Final  . Lymphs 05/16/2014 29   Final  . Monocytes 05/16/2014 7   Final  . Eos 05/16/2014 4   Final  . Basos 05/16/2014 0   Final  . Neutrophils Absolute 05/16/2014 5.2  1.4 - 7.0 x10E3/uL Final  . Lymphocytes Absolute 05/16/2014 2.6  0.7 - 3.1 x10E3/uL Final  . Monocytes Absolute 05/16/2014 0.6  0.1 - 0.9 x10E3/uL Final  . Eosinophils Absolute 05/16/2014 0.3  0.0 - 0.4 x10E3/uL Final  . Basophils Absolute 05/16/2014 0.0  0.0 - 0.2 x10E3/uL Final  . Immature Granulocytes 05/16/2014 0   Final  . Immature Grans (Abs) 05/16/2014 0.0  0.0 - 0.1 x10E3/uL Final  . Vitamin B-12 05/16/2014 681  211 - 946  pg/mL Final     Assessment/Plan  1. Skin tear of lower leg without complication, right, subsequent encounter Change dressings daily - silver sulfADIAZINE (SILVADENE) 1 % cream; Apply to wounds  daily.  Dispense: 50 g; Refill: 0  2. Ulcer of left lower leg, limited to breakdown of skin Eyes dressings daily - silver sulfADIAZINE (SILVADENE) 1 % cream; Apply to wounds daily.  Dispense: 50 g; Refill: 0  3. Edema Tiffany current medication  4. Essential hypertension Controlled - Basic metabolic panel; Future - Basic metabolic panel  5. DM type 2, uncontrolled, with neuropathy Controlled - Basic metabolic panel; Future - Hemoglobin A1c; Future - Basic metabolic panel - Hemoglobin A1c

## 2014-07-24 NOTE — Patient Instructions (Signed)
Change dressings daily or as needed.

## 2014-07-25 LAB — BASIC METABOLIC PANEL
BUN/Creatinine Ratio: 18 (ref 10–22)
BUN: 19 mg/dL (ref 8–27)
CO2: 24 mmol/L (ref 18–29)
Calcium: 9.9 mg/dL (ref 8.6–10.2)
Chloride: 96 mmol/L — ABNORMAL LOW (ref 97–108)
Creatinine, Ser: 1.05 mg/dL (ref 0.76–1.27)
GFR, EST AFRICAN AMERICAN: 87 mL/min/{1.73_m2} (ref >59)
GFR, EST NON AFRICAN AMERICAN: 75 mL/min/{1.73_m2} (ref >59)
Glucose: 139 mg/dL — ABNORMAL HIGH (ref 65–99)
POTASSIUM: 4.4 mmol/L (ref 3.5–5.2)
SODIUM: 140 mmol/L (ref 134–144)

## 2014-07-25 LAB — HEMOGLOBIN A1C
ESTIMATED AVERAGE GLUCOSE: 220 mg/dL
Hgb A1c MFr Bld: 9.3 % — ABNORMAL HIGH (ref 4.8–5.6)

## 2014-07-30 ENCOUNTER — Encounter (HOSPITAL_COMMUNITY): Payer: Self-pay

## 2014-07-30 ENCOUNTER — Other Ambulatory Visit: Payer: Self-pay

## 2014-07-30 DIAGNOSIS — E1165 Type 2 diabetes mellitus with hyperglycemia: Secondary | ICD-10-CM

## 2014-07-30 DIAGNOSIS — I10 Essential (primary) hypertension: Secondary | ICD-10-CM

## 2014-07-30 DIAGNOSIS — E1140 Type 2 diabetes mellitus with diabetic neuropathy, unspecified: Secondary | ICD-10-CM

## 2014-07-30 DIAGNOSIS — D519 Vitamin B12 deficiency anemia, unspecified: Secondary | ICD-10-CM

## 2014-07-30 DIAGNOSIS — IMO0002 Reserved for concepts with insufficient information to code with codable children: Secondary | ICD-10-CM

## 2014-08-01 ENCOUNTER — Ambulatory Visit: Payer: Medicare Other | Admitting: Internal Medicine

## 2014-08-03 ENCOUNTER — Other Ambulatory Visit: Payer: Medicare Other

## 2014-08-06 ENCOUNTER — Other Ambulatory Visit: Payer: Medicare Other

## 2014-08-08 ENCOUNTER — Ambulatory Visit: Payer: Medicare Other | Admitting: Internal Medicine

## 2014-08-10 ENCOUNTER — Encounter (HOSPITAL_COMMUNITY): Payer: Self-pay | Admitting: *Deleted

## 2014-08-10 ENCOUNTER — Telehealth: Payer: Self-pay

## 2014-08-10 ENCOUNTER — Observation Stay (HOSPITAL_COMMUNITY)
Admission: EM | Admit: 2014-08-10 | Discharge: 2014-08-13 | Disposition: A | Discharge status: Home / Self Care | Payer: Medicare Other | Attending: Internal Medicine | Admitting: Internal Medicine

## 2014-08-10 ENCOUNTER — Emergency Department (HOSPITAL_COMMUNITY): Payer: Medicare Other

## 2014-08-10 DIAGNOSIS — G894 Chronic pain syndrome: Secondary | ICD-10-CM | POA: Diagnosis present

## 2014-08-10 DIAGNOSIS — E119 Type 2 diabetes mellitus without complications: Secondary | ICD-10-CM | POA: Diagnosis present

## 2014-08-10 DIAGNOSIS — L03119 Cellulitis of unspecified part of limb: Secondary | ICD-10-CM | POA: Diagnosis present

## 2014-08-10 DIAGNOSIS — F329 Major depressive disorder, single episode, unspecified: Secondary | ICD-10-CM | POA: Diagnosis not present

## 2014-08-10 DIAGNOSIS — I739 Peripheral vascular disease, unspecified: Secondary | ICD-10-CM | POA: Diagnosis present

## 2014-08-10 DIAGNOSIS — Z9884 Bariatric surgery status: Secondary | ICD-10-CM | POA: Diagnosis not present

## 2014-08-10 DIAGNOSIS — Z888 Allergy status to other drugs, medicaments and biological substances status: Secondary | ICD-10-CM | POA: Diagnosis not present

## 2014-08-10 DIAGNOSIS — I10 Essential (primary) hypertension: Secondary | ICD-10-CM | POA: Diagnosis present

## 2014-08-10 DIAGNOSIS — Z91040 Latex allergy status: Secondary | ICD-10-CM | POA: Insufficient documentation

## 2014-08-10 DIAGNOSIS — G4733 Obstructive sleep apnea (adult) (pediatric): Secondary | ICD-10-CM | POA: Diagnosis not present

## 2014-08-10 DIAGNOSIS — M1990 Unspecified osteoarthritis, unspecified site: Secondary | ICD-10-CM | POA: Diagnosis not present

## 2014-08-10 DIAGNOSIS — Z6841 Body Mass Index (BMI) 40.0 and over, adult: Secondary | ICD-10-CM | POA: Insufficient documentation

## 2014-08-10 DIAGNOSIS — Z8619 Personal history of other infectious and parasitic diseases: Secondary | ICD-10-CM | POA: Insufficient documentation

## 2014-08-10 DIAGNOSIS — L03116 Cellulitis of left lower limb: Secondary | ICD-10-CM | POA: Diagnosis not present

## 2014-08-10 DIAGNOSIS — E1140 Type 2 diabetes mellitus with diabetic neuropathy, unspecified: Secondary | ICD-10-CM | POA: Insufficient documentation

## 2014-08-10 DIAGNOSIS — E1165 Type 2 diabetes mellitus with hyperglycemia: Secondary | ICD-10-CM | POA: Insufficient documentation

## 2014-08-10 DIAGNOSIS — I4891 Unspecified atrial fibrillation: Secondary | ICD-10-CM | POA: Insufficient documentation

## 2014-08-10 DIAGNOSIS — R601 Generalized edema: Secondary | ICD-10-CM | POA: Diagnosis present

## 2014-08-10 DIAGNOSIS — R609 Edema, unspecified: Secondary | ICD-10-CM | POA: Diagnosis present

## 2014-08-10 DIAGNOSIS — Z8701 Personal history of pneumonia (recurrent): Secondary | ICD-10-CM | POA: Diagnosis not present

## 2014-08-10 DIAGNOSIS — I509 Heart failure, unspecified: Secondary | ICD-10-CM

## 2014-08-10 DIAGNOSIS — L03115 Cellulitis of right lower limb: Principal | ICD-10-CM | POA: Diagnosis present

## 2014-08-10 DIAGNOSIS — F419 Anxiety disorder, unspecified: Secondary | ICD-10-CM | POA: Insufficient documentation

## 2014-08-10 DIAGNOSIS — Z794 Long term (current) use of insulin: Secondary | ICD-10-CM | POA: Diagnosis not present

## 2014-08-10 DIAGNOSIS — IMO0002 Reserved for concepts with insufficient information to code with codable children: Secondary | ICD-10-CM | POA: Diagnosis present

## 2014-08-10 LAB — BASIC METABOLIC PANEL
ANION GAP: 13 (ref 5–15)
BUN: 18 mg/dL (ref 6–20)
CO2: 26 mmol/L (ref 22–32)
CREATININE: 1 mg/dL (ref 0.61–1.24)
Calcium: 9.1 mg/dL (ref 8.9–10.3)
Chloride: 98 mmol/L — ABNORMAL LOW (ref 101–111)
GFR calc non Af Amer: >60 mL/min (ref >60)
Glucose, Bld: 208 mg/dL — ABNORMAL HIGH (ref 65–99)
Potassium: 4.1 mmol/L (ref 3.5–5.1)
Sodium: 137 mmol/L (ref 135–145)

## 2014-08-10 LAB — I-STAT TROPONIN, ED: Troponin i, poc: 0 ng/mL (ref 0.00–0.08)

## 2014-08-10 LAB — CBC
HCT: 35.8 % — ABNORMAL LOW (ref 39.0–52.0)
Hemoglobin: 11.1 g/dL — ABNORMAL LOW (ref 13.0–17.0)
MCH: 27 pg (ref 26.0–34.0)
MCHC: 31 g/dL (ref 30.0–36.0)
MCV: 87.1 fL (ref 78.0–100.0)
Platelets: 234 10*3/uL (ref 150–400)
RBC: 4.11 MIL/uL — AB (ref 4.22–5.81)
RDW: 16.5 % — ABNORMAL HIGH (ref 11.5–15.5)
WBC: 6.9 10*3/uL (ref 4.0–10.5)

## 2014-08-10 LAB — BRAIN NATRIURETIC PEPTIDE: B NATRIURETIC PEPTIDE 5: 42 pg/mL (ref 0.0–100.0)

## 2014-08-10 LAB — CBG MONITORING, ED: Glucose-Capillary: 235 mg/dL — ABNORMAL HIGH (ref 65–99)

## 2014-08-10 LAB — GLUCOSE, CAPILLARY: GLUCOSE-CAPILLARY: 192 mg/dL — AB (ref 65–99)

## 2014-08-10 MED ORDER — FUROSEMIDE 10 MG/ML IJ SOLN
80.0000 mg | Freq: Once | INTRAMUSCULAR | Status: AC
  Administered 2014-08-10: 80 mg via INTRAVENOUS
  Filled 2014-08-10: qty 8

## 2014-08-10 MED ORDER — SODIUM CHLORIDE 0.9 % IV SOLN: 250.0000 mL | INTRAVENOUS | Status: DC | PRN

## 2014-08-10 MED ORDER — GABAPENTIN 800 MG PO TABS: 800.0000 mg | ORAL_TABLET | Freq: Three times a day (TID) | ORAL | Status: DC

## 2014-08-10 MED ORDER — INSULIN ASPART 100 UNIT/ML ~~LOC~~ SOLN
0.0000 [IU] | Freq: Three times a day (TID) | SUBCUTANEOUS | Status: DC
  Administered 2014-08-11: 1 [IU] via SUBCUTANEOUS
  Administered 2014-08-11 – 2014-08-12 (×3): 3 [IU] via SUBCUTANEOUS
  Administered 2014-08-12: 2 [IU] via SUBCUTANEOUS
  Administered 2014-08-12: 3 [IU] via SUBCUTANEOUS
  Administered 2014-08-13: 2 [IU] via SUBCUTANEOUS

## 2014-08-10 MED ORDER — SODIUM CHLORIDE 0.9 % IJ SOLN: 3.0000 mL | INTRAMUSCULAR | Status: DC | PRN

## 2014-08-10 MED ORDER — SODIUM CHLORIDE 0.9 % IJ SOLN
3.0000 mL | Freq: Two times a day (BID) | INTRAMUSCULAR | Status: DC
  Administered 2014-08-11 – 2014-08-12 (×5): 3 mL via INTRAVENOUS

## 2014-08-10 MED ORDER — GABAPENTIN 400 MG PO CAPS
800.0000 mg | ORAL_CAPSULE | Freq: Every day | ORAL | Status: DC
  Administered 2014-08-11 – 2014-08-12 (×2): 800 mg via ORAL
  Filled 2014-08-10 (×3): qty 2

## 2014-08-10 MED ORDER — VANCOMYCIN HCL 10 G IV SOLR
1500.0000 mg | Freq: Once | INTRAVENOUS | Status: AC
  Administered 2014-08-11: 1500 mg via INTRAVENOUS
  Filled 2014-08-10: qty 1500

## 2014-08-10 MED ORDER — FUROSEMIDE 10 MG/ML IJ SOLN
80.0000 mg | Freq: Two times a day (BID) | INTRAMUSCULAR | Status: DC
  Administered 2014-08-11 – 2014-08-13 (×5): 80 mg via INTRAVENOUS
  Filled 2014-08-10 (×9): qty 8

## 2014-08-10 MED ORDER — SODIUM CHLORIDE 0.9 % IV SOLN
1500.0000 mg | Freq: Two times a day (BID) | INTRAVENOUS | Status: DC
  Administered 2014-08-11 – 2014-08-12 (×3): 1500 mg via INTRAVENOUS
  Filled 2014-08-10 (×3): qty 1500

## 2014-08-10 MED ORDER — GABAPENTIN 400 MG PO CAPS
1600.0000 mg | ORAL_CAPSULE | Freq: Two times a day (BID) | ORAL | Status: DC
  Administered 2014-08-11 – 2014-08-13 (×5): 1600 mg via ORAL
  Filled 2014-08-10 (×8): qty 4

## 2014-08-10 MED ORDER — METOPROLOL TARTRATE 50 MG PO TABS
50.0000 mg | ORAL_TABLET | Freq: Two times a day (BID) | ORAL | Status: DC
Start: 2014-08-10 — End: 2014-08-13
  Administered 2014-08-11 – 2014-08-13 (×6): 50 mg via ORAL
  Filled 2014-08-10 (×8): qty 1

## 2014-08-10 MED ORDER — METHADONE HCL 10 MG PO TABS
10.0000 mg | ORAL_TABLET | Freq: Three times a day (TID) | ORAL | Status: DC
  Administered 2014-08-11 – 2014-08-13 (×9): 10 mg via ORAL
  Filled 2014-08-10 (×9): qty 1

## 2014-08-10 MED ORDER — VANCOMYCIN HCL IN DEXTROSE 1-5 GM/200ML-% IV SOLN
1000.0000 mg | Freq: Once | INTRAVENOUS | Status: AC
  Administered 2014-08-10: 1000 mg via INTRAVENOUS
  Filled 2014-08-10: qty 200

## 2014-08-10 NOTE — Progress Notes (Signed)
ANTIBIOTIC CONSULT NOTE - INITIAL  Pharmacy Consult for Vancomycin Indication: cellulitis  Allergies  Allergen Reactions  . Adhesive [Tape] Other (See Comments)    Unknown paper tape ok to use   . Feldene [Piroxicam] Other (See Comments)    unknown  . Latex Other (See Comments)    Rash and itching    Patient Measurements: Height: 5\' 9"  (175.3 cm) Weight: (!) 395 lb 1 oz (179.2 kg) IBW/kg (Calculated) : 70.7 Adjusted Body Weight:   Vital Signs: Temp: 98.6 F (37 C) (05/27 2309) Temp Source: Oral (05/27 2309) BP: 147/83 mmHg (05/27 2309) Pulse Rate: 84 (05/27 2309) Intake/Output from previous day:   Intake/Output from this shift:    Labs:  Recent Labs  08/10/14 1952  WBC 6.9  HGB 11.1*  PLT 234  CREATININE 1.00   Estimated Creatinine Clearance: 122 mL/min (by C-G formula based on Cr of 1). No results for input(s): VANCOTROUGH, VANCOPEAK, VANCORANDOM, GENTTROUGH, GENTPEAK, GENTRANDOM, TOBRATROUGH, TOBRAPEAK, TOBRARND, AMIKACINPEAK, AMIKACINTROU, AMIKACIN in the last 72 hours.   Microbiology: No results found for this or any previous visit (from the past 720 hour(s)).  Medical History: Past Medical History  Diagnosis Date  . CHF (congestive heart failure)   . Hypertension   . Anemia   . Angina   . Shortness of breath   . Anxiety   . Blood transfusion     last 10' 14- "GI bleed"  . Headache(784.0)   . Depression   . Hepatitis B   . Neuromuscular disorder   . Arthritis   . Diabetes mellitus 09/18/2011  . Atrial fibrillation with RVR 01/24/2013  . Renal insufficiency   . DJD (degenerative joint disease)   . History of alcohol abuse     last drink in 1993  . Obesity   . Diverticulosis   . Fatty liver   . Sleep apnea, obstructive     does where bipap. 06-04-14 "doesn't use much now, no mask/tubing now.  . Pneumonia     not at present time  . Edema extremities     bilateral lower extremities-"weepy areas due to fluid retention"  . Fundic gland polyps  of stomach, benign     Medications:  Prescriptions prior to admission  Medication Sig Dispense Refill Last Dose  . acetaminophen (TYLENOL) 650 MG CR tablet Take 1,950 mg by mouth 2 (two) times daily.   08/10/2014 at Unknown time  . amLODipine (NORVASC) 10 MG tablet Take 10 mg by mouth daily.   08/10/2014 at Unknown time  . DULoxetine (CYMBALTA) 60 MG capsule One twice daily to help neuropathy (Patient taking differently: Take 60 mg by mouth 2 (two) times daily. One twice daily to help neuropathy) 60 capsule 5 08/10/2014 at Unknown time  . gabapentin (NEURONTIN) 800 MG tablet Take 2 tablets 3 times daily to help neuropathy (Patient taking differently: Take 800-1,600 mg by mouth 3 (three) times daily. Take 2 tablets in the morning, one tablet at lunch, and 2 tablets in the evening.) 180 tablet 3 08/10/2014 at Unknown time  . glucose blood (ACCU-CHEK AVIVA PLUS) test strip 1 each by Other route See admin instructions. Check blood sugar twice daily   unknown  . Insulin Glargine (LANTUS SOLOSTAR) 100 UNIT/ML Solostar Pen INJECT 18 UNITS EVERY MORNING 3 mL 3 08/10/2014 at Unknown time  . Insulin Pen Needle (NOVOFINE) 30G X 8 MM MISC Inject 1 packet into the skin See admin instructions. Use with insulin once daily.   unknown  . lisinopril (PRINIVIL,ZESTRIL)  20 MG tablet One daily to control BP and strengthen the heart (Patient taking differently: TAKE 20 MG BY MOUTH DAILY) 30 tablet 5 08/10/2014 at Unknown time  . metFORMIN (GLUCOPHAGE) 1000 MG tablet Take 1,000 mg by mouth 2 (two) times daily with a meal.   08/10/2014 at Unknown time  . methadone (DOLOPHINE) 10 MG tablet Take 1 tablet (10 mg total) by mouth 4 (four) times daily. (Patient taking differently: Take 10 mg by mouth every 8 (eight) hours. ) 30 tablet 0 08/10/2014 at Unknown time  . metoprolol (LOPRESSOR) 50 MG tablet TAKE ONE TABLET TWICE DAILY 60 tablet 3 08/10/2014 at Unknown time  . Multiple Vitamin (MULITIVITAMIN WITH MINERALS) TABS Take 1 tablet by  mouth daily.   08/10/2014 at Unknown time  . nystatin-triamcinolone (MYCOLOG II) cream Apply sparingly to rash twice daily 60 g 5 08/10/2014 at Unknown time  . pantoprazole (PROTONIX) 40 MG tablet TAKE 1 TABLET TWO TIMES DAILY. 60 tablet 5 08/10/2014 at Unknown time  . polyethylene glycol (MIRALAX / GLYCOLAX) packet Take 17 g by mouth daily as needed for mild constipation or moderate constipation.   1 week ago at Unknown time  . torsemide (DEMADEX) 100 MG tablet One twice daily for diuretic (Patient taking differently: Take 100 mg by mouth 2 (two) times daily. One twice daily for diuretic) 60 tablet 5 08/09/2014 at Unknown time  . traZODone (DESYREL) 150 MG tablet Take 2 tablets (300 mg total) by mouth at bedtime. 2 by mouth at bedtime 30 tablet 0 08/09/2014 at Unknown time  . atorvastatin (LIPITOR) 40 MG tablet take 1 tablet once daily (Patient not taking: Reported on 08/10/2014) 30 tablet 5 Not Taking at Unknown time  . lubiprostone (AMITIZA) 24 MCG capsule Take 1 capsule (24 mcg total) by mouth 2 (two) times daily with a meal. (Patient not taking: Reported on 08/10/2014) 60 capsule 11 Not Taking at Unknown time  . silver sulfADIAZINE (SILVADENE) 1 % cream Apply to wounds daily. (Patient not taking: Reported on 08/10/2014) 50 g 0 Not Taking at Unknown time   Scheduled:  . furosemide  80 mg Intravenous Q12H  . gabapentin  800-1,600 mg Oral TID  . [START ON 08/11/2014] insulin aspart  0-9 Units Subcutaneous TID WC  . methadone  10 mg Oral QID  . metoprolol  50 mg Oral BID  . sodium chloride  3 mL Intravenous Q12H  . [START ON 08/11/2014] vancomycin  1,500 mg Intravenous Once   Infusions:    Assessment: 64yo male with history of DM2, obesity, HTN, and chronic LE edema presents with leg swelling associated with weeping and redness. Pharmacy is consulted to dose vancomycin for cellulitis. Pt is afebrile, WBC wnl, sCr 1.  Goal of Therapy:  Vancomycin trough level 10-15 mcg/ml  Plan:  Vancomycin 2500mg   IV load followed by 1500mg  q12h Measure antibiotic drug levels at steady state Follow up culture results, renal function, and clinical course  Andrey Cota. Diona Foley, PharmD Clinical Pharmacist Pager [phone] 08/10/2014,11:33 PM

## 2014-08-10 NOTE — ED Provider Notes (Signed)
CSN: [id number]     Arrival date & time 08/10/14  1926 History   First MD Initiated Contact with Patient 08/10/14 2034     Chief Complaint  Patient presents with  . Leg Swelling  . Shortness of Breath     (Consider location/radiation/quality/duration/timing/severity/associated sxs/prior Treatment) HPI Comments: Patient is a 64 year old male with history of morbid obesity, diabetes, hypertension, CHF. He presents for evaluation of lower extremity swelling, lower extremity pain, and shortness of breath which has worsened over the past week. He has noticed his legs have become reddened and are now weeping fluid. He denies any fevers or chills. He denies to me he is having any chest pain.  Patient is a 64 y.o. male presenting with shortness of breath. The history is provided by the patient.  Shortness of Breath Severity:  Moderate Onset quality:  Gradual Duration:  1 week Timing:  Constant Progression:  Worsening Chronicity:  New Context: activity   Relieved by:  Nothing Worsened by:  Nothing tried Ineffective treatments:  None tried Associated symptoms: no chest pain and no fever     Past Medical History  Diagnosis Date  . CHF (congestive heart failure)   . Hypertension   . Anemia   . Angina   . Shortness of breath   . Anxiety   . Blood transfusion     last 10' 14- "GI bleed"  . Headache(784.0)   . Depression   . Hepatitis B   . Neuromuscular disorder   . Arthritis   . Diabetes mellitus 09/18/2011  . Atrial fibrillation with RVR 01/24/2013  . Renal insufficiency   . DJD (degenerative joint disease)   . History of alcohol abuse     last drink in 1993  . Obesity   . Diverticulosis   . Fatty liver   . Sleep apnea, obstructive     does where bipap. 06-04-14 "doesn't use much now, no mask/tubing now.  . Pneumonia     not at present time  . Edema extremities     bilateral lower extremities-"weepy areas due to fluid retention"  . Fundic gland polyps of stomach, benign     Past Surgical History  Procedure Laterality Date  . Gastric bypass  1977    Reversed in 1992 and revision 1994  . Diagnostic laparoscopy    . Esophagoscopy N/A 01/01/2013    Procedure: ESOPHAGOSCOPY;  Surgeon: Jeryl Columbia, MD;  Location: [facility];  Service: Endoscopy;  Laterality: N/A;  . Esophagogastroduodenoscopy N/A 01/03/2013    Procedure: ESOPHAGOGASTRODUODENOSCOPY (EGD);  Surgeon: Arta Silence, MD;  Location: [facility];  Service: Endoscopy;  Laterality: N/A;  . Esophagogastroduodenoscopy N/A 01/03/2013    Procedure: ESOPHAGOGASTRODUODENOSCOPY (EGD);  Surgeon: Arta Silence, MD;  Location: [facility];  Service: Endoscopy;  Laterality: N/A;  . Esophagogastroduodenoscopy N/A 01/23/2013    Procedure: ESOPHAGOGASTRODUODENOSCOPY (EGD);  Surgeon: Lear Ng, MD;  Location: [facility];  Service: Endoscopy;  Laterality: N/A;  . Esophagogastroduodenoscopy Left 01/27/2013    Procedure: ESOPHAGOGASTRODUODENOSCOPY (EGD);  Surgeon: Lear Ng, MD;  Location: [facility];  Service: Endoscopy;  Laterality: Left;  . Knee surgery Left 2004  . Hydrocele excision  1996    x 2   . Abdominal surgery      [id number]  . Colonoscopy    . Back surgery      x 8 -,multiple fusions(cervical to lumbar)  . Colonoscopy with propofol N/A 06/14/2014    Procedure: COLONOSCOPY WITH PROPOFOL;  Surgeon: Gatha Mayer, MD;  Location:  [facility];  Service: Endoscopy;  Laterality: N/A;  . Esophagogastroduodenoscopy N/A 06/14/2014    Procedure: ESOPHAGOGASTRODUODENOSCOPY (EGD);  Surgeon: Gatha Mayer, MD;  Location: [facility];  Service: Endoscopy;  Laterality: N/A;   Family History  Problem Relation Age of Onset  . Heart disease Father   . Skin cancer Father   . COPD Father   . Hypertension Father   . Heart disease Maternal Uncle   . High blood pressure Sister   . High blood pressure Sister   . High blood pressure Son   . Diabetes Daughter   . Depression Son   . Depression Son   .  Stroke Father   . Arthritis Mother   . Arthritis Sister   . Arthritis Sister   . Osteoporosis Mother   . Cancer Maternal Grandmother   . Heart disease Maternal Uncle   . Heart disease Paternal Uncle    History  Substance Use Topics  . Smoking status: Never Smoker   . Smokeless tobacco: Not on file  . Alcohol Use: No    Review of Systems  Constitutional: Negative for fever.  Respiratory: Positive for shortness of breath.   Cardiovascular: Negative for chest pain.  All other systems reviewed and are negative.     Allergies  Adhesive; Feldene; and Latex  Home Medications   Prior to Admission medications   Medication Sig Start Date End Date Taking? Authorizing Provider  acetaminophen (TYLENOL) 650 MG CR tablet Take 1,950 mg by mouth 2 (two) times daily.    Historical Provider, MD  atorvastatin (LIPITOR) 40 MG tablet take 1 tablet once daily 04/16/14   Estill Dooms, MD  docusate sodium (COLACE) 100 MG capsule Take 200 mg by mouth 2 (two) times daily as needed for mild constipation.     Historical Provider, MD  DULoxetine (CYMBALTA) 60 MG capsule One twice daily to help neuropathy Patient taking differently: Take 60 mg by mouth 2 (two) times daily. One twice daily to help neuropathy 02/14/14   Estill Dooms, MD  gabapentin (NEURONTIN) 800 MG tablet Take 2 tablets 3 times daily to help neuropathy 06/20/14   Estill Dooms, MD  glucose blood (ACCU-CHEK AVIVA PLUS) test strip 1 each by Other route See admin instructions. Check blood sugar twice daily    Historical Provider, MD  Insulin Glargine (LANTUS SOLOSTAR) 100 UNIT/ML Solostar Pen INJECT 18 UNITS EVERY MORNING 06/14/14   Gatha Mayer, MD  Insulin Pen Needle (NOVOFINE) 30G X 8 MM MISC Inject 1 packet into the skin See admin instructions. Use with insulin once daily.    Historical Provider, MD  lisinopril (PRINIVIL,ZESTRIL) 20 MG tablet One daily to control BP and strengthen the heart 05/11/14   Estill Dooms, MD  lubiprostone  (AMITIZA) 24 MCG capsule Take 1 capsule (24 mcg total) by mouth 2 (two) times daily with a meal. 04/26/14   Gatha Mayer, MD  metFORMIN (GLUCOPHAGE) 1000 MG tablet Take 1,000 mg by mouth 2 (two) times daily with a meal.    Historical Provider, MD  methadone (DOLOPHINE) 10 MG tablet Take 1 tablet (10 mg total) by mouth 4 (four) times daily. 06/14/14   Gatha Mayer, MD  metoprolol (LOPRESSOR) 50 MG tablet TAKE ONE TABLET TWICE DAILY 05/28/14   Estill Dooms, MD  Multiple Vitamin (MULITIVITAMIN WITH MINERALS) TABS Take 1 tablet by mouth daily.    Historical Provider, MD  nystatin-triamcinolone [facility] II) cream Apply sparingly to rash twice daily 04/11/14  Estill Dooms, MD  pantoprazole (PROTONIX) 40 MG tablet TAKE 1 TABLET TWO TIMES DAILY. 05/23/14   Estill Dooms, MD  psyllium (METAMUCIL) 58.6 % packet Take 1 packet by mouth daily.    Historical Provider, MD  silver sulfADIAZINE (SILVADENE) 1 % cream Apply to wounds daily. 07/24/14   Estill Dooms, MD  spironolactone (ALDACTONE) 50 MG tablet Take 50 mg by mouth daily.    Historical Provider, MD  torsemide (DEMADEX) 100 MG tablet One twice daily for diuretic Patient taking differently: Take 100 mg by mouth 2 (two) times daily. One twice daily for diuretic 02/14/14   Estill Dooms, MD  traZODone (DESYREL) 150 MG tablet Take 2 tablets (300 mg total) by mouth at bedtime. 2 by mouth at bedtime 06/14/14   Gatha Mayer, MD   BP 134/76 mmHg  Pulse 77  Temp(Src) 98.1 F (36.7 C) (Oral)  Resp 20  Ht 5\' 9"  (1.753 m)  Wt 402 lb (182.346 kg)  BMI 59.34 kg/m2  SpO2 100% Physical Exam  Constitutional: He is oriented to person, place, and time. He appears well-developed and well-nourished. No distress.  HENT:  Head: Normocephalic and atraumatic.  Neck: Normal range of motion. Neck supple.  Cardiovascular: Normal rate, regular rhythm and normal heart sounds.   No murmur heard. Pulmonary/Chest: Effort normal and breath sounds normal. No respiratory  distress. He has no wheezes.  Abdominal: Soft. Bowel sounds are normal. He exhibits no distension. There is no tenderness.  Musculoskeletal:  There is 3+ pitting edema of the lower extremities. There is redness and warmth to the anterior aspect of the right leg along with clear and purulent fluid draining from several vesicles.  Neurological: He is alert and oriented to person, place, and time.  Skin: Skin is warm and dry. He is not diaphoretic.  Nursing note and vitals reviewed.   ED Course  Procedures (including critical care time) Labs Review Labs Reviewed  BASIC METABOLIC PANEL - Abnormal; Notable for the following:    Chloride 98 (*)    Glucose, Bld 208 (*)    All other components within normal limits  CBC - Abnormal; Notable for the following:    RBC 4.11 (*)    Hemoglobin 11.1 (*)    HCT 35.8 (*)    RDW 16.5 (*)    All other components within normal limits  BRAIN NATRIURETIC PEPTIDE  I-STAT TROPOININ, ED    Imaging Review Dg Chest 2 View  08/10/2014   CLINICAL DATA:  Short of breath today.  Lower extremity swelling.  EXAM: CHEST  2 VIEW  COMPARISON:  12/07/2013  FINDINGS: Cardiac silhouette is normal in size. No mediastinal or hilar masses or evidence of adenopathy. Mild stable reticular scarring or subsegmental atelectasis at the left lateral lung base. Lungs otherwise clear. No pleural effusion or pneumothorax. Stable surgical clips are noted in the left upper quadrant.  IMPRESSION: No active cardiopulmonary disease.   Electronically Signed   By: Lajean Manes M.D.   On: 08/10/2014 20:17     EKG Interpretation   Date/Time:  Friday Aug 10 2014 19:31:17 EDT Ventricular Rate:  77 PR Interval:  170 QRS Duration: 102 QT Interval:  418 QTC Calculation: 473 R Axis:   -11 Text Interpretation:  Normal sinus rhythm Incomplete right bundle branch  block Borderline ECG Confirmed by Isaiha Asare  MD, Gala Padovano ([id number]) on 08/10/2014  8:53:19 PM      MDM   Final diagnoses:  None  Patient is a 64 year old male with history of morbid obesity, CHF. He presents for increased lower extremity swelling and dyspnea on exertion. He is also having redness and pain to the front of both legs which she has not had before. This appears to be a CHF exacerbation with superimposed cellulitis of the legs. Due to his comorbidities, feel as though admission will be required and have spoken with the hospitalist regarding this. He was given IV Lasix and vancomycin.    Veryl Speak, MD 08/13/14 [phone]

## 2014-08-10 NOTE — Telephone Encounter (Signed)
Please advise patient be seen in urgent care as we have no more openings this afternoon and he is still en route back from [facility].  Someone needs to actually see his legs to know if he needs antibiotics, wraps, etc.

## 2014-08-10 NOTE — H&P (Signed)
PCP:   Estill Dooms, MD   Chief Complaint:  Legs swelling, weeping and red  HPI: 64 yo male h/o morbid obesity, OSA (noncompliant with cpap), chf, chronic le edema, htn, iddm comes in with one week of worsening ble swelling and redness.  He went on vacation last week and that's when they got bad.  Normally has swelling always in his legs, but much worse now, started developing some skin breakdown and now they are red.  Denies any fevers.  He saw his pcp in the last couple of days who started him on mycolog and silvadene cream.  No oral antibiotics.  Pt sleeps in recliner every night with legs propped up.  Some sob a little worse than normal.  No n/v/d.  No chest pain.  No leg pain.  We are asked to admit for cellulitis and worsening ble edema.  Review of Systems:  Positive and negative as per HPI otherwise all other systems are negative  Past Medical History: Past Medical History  Diagnosis Date  . CHF (congestive heart failure)   . Hypertension   . Anemia   . Angina   . Shortness of breath   . Anxiety   . Blood transfusion     last 10' 14- "GI bleed"  . Headache(784.0)   . Depression   . Hepatitis B   . Neuromuscular disorder   . Arthritis   . Diabetes mellitus 09/18/2011  . Atrial fibrillation with RVR 01/24/2013  . Renal insufficiency   . DJD (degenerative joint disease)   . History of alcohol abuse     last drink in 1993  . Obesity   . Diverticulosis   . Fatty liver   . Sleep apnea, obstructive     does where bipap. 06-04-14 "doesn't use much now, no mask/tubing now.  . Pneumonia     not at present time  . Edema extremities     bilateral lower extremities-"weepy areas due to fluid retention"  . Fundic gland polyps of stomach, benign    Past Surgical History  Procedure Laterality Date  . Gastric bypass  1977    Reversed in 1992 and revision 1994  . Diagnostic laparoscopy    . Esophagoscopy N/A 01/01/2013    Procedure: ESOPHAGOSCOPY;  Surgeon: Jeryl Columbia, MD;   Location: [facility];  Service: Endoscopy;  Laterality: N/A;  . Esophagogastroduodenoscopy N/A 01/03/2013    Procedure: ESOPHAGOGASTRODUODENOSCOPY (EGD);  Surgeon: Arta Silence, MD;  Location: [facility];  Service: Endoscopy;  Laterality: N/A;  . Esophagogastroduodenoscopy N/A 01/03/2013    Procedure: ESOPHAGOGASTRODUODENOSCOPY (EGD);  Surgeon: Arta Silence, MD;  Location: [facility];  Service: Endoscopy;  Laterality: N/A;  . Esophagogastroduodenoscopy N/A 01/23/2013    Procedure: ESOPHAGOGASTRODUODENOSCOPY (EGD);  Surgeon: Lear Ng, MD;  Location: [facility];  Service: Endoscopy;  Laterality: N/A;  . Esophagogastroduodenoscopy Left 01/27/2013    Procedure: ESOPHAGOGASTRODUODENOSCOPY (EGD);  Surgeon: Lear Ng, MD;  Location: [facility];  Service: Endoscopy;  Laterality: Left;  . Knee surgery Left 2004  . Hydrocele excision  1996    x 2   . Abdominal surgery      [phone]  . Colonoscopy    . Back surgery      x 8 -,multiple fusions(cervical to lumbar)  . Colonoscopy with propofol N/A 06/14/2014    Procedure: COLONOSCOPY WITH PROPOFOL;  Surgeon: Gatha Mayer, MD;  Location: [facility];  Service: Endoscopy;  Laterality: N/A;  . Esophagogastroduodenoscopy N/A 06/14/2014    Procedure: ESOPHAGOGASTRODUODENOSCOPY (EGD);  Surgeon: Gatha Mayer, MD;  Location: [facility];  Service: Endoscopy;  Laterality: N/A;    Medications: Prior to Admission medications   Medication Sig Start Date End Date Taking? Authorizing Provider  acetaminophen (TYLENOL) 650 MG CR tablet Take 1,950 mg by mouth 2 (two) times daily.   Yes Historical Provider, MD  amLODipine (NORVASC) 10 MG tablet Take 10 mg by mouth daily. 07/28/14  Yes Historical Provider, MD  DULoxetine (CYMBALTA) 60 MG capsule One twice daily to help neuropathy Patient taking differently: Take 60 mg by mouth 2 (two) times daily. One twice daily to help neuropathy 02/14/14  Yes Estill Dooms, MD  gabapentin (NEURONTIN) 800 MG  tablet Take 2 tablets 3 times daily to help neuropathy Patient taking differently: Take 800-1,600 mg by mouth 3 (three) times daily. Take 2 tablets in the morning, one tablet at lunch, and 2 tablets in the evening. 06/20/14  Yes Estill Dooms, MD  glucose blood (ACCU-CHEK AVIVA PLUS) test strip 1 each by Other route See admin instructions. Check blood sugar twice daily   Yes Historical Provider, MD  Insulin Glargine (LANTUS SOLOSTAR) 100 UNIT/ML Solostar Pen INJECT 18 UNITS EVERY MORNING 06/14/14  Yes Gatha Mayer, MD  Insulin Pen Needle (NOVOFINE) 30G X 8 MM MISC Inject 1 packet into the skin See admin instructions. Use with insulin once daily.   Yes Historical Provider, MD  lisinopril (PRINIVIL,ZESTRIL) 20 MG tablet One daily to control BP and strengthen the heart Patient taking differently: TAKE 20 MG BY MOUTH DAILY 05/11/14  Yes Estill Dooms, MD  metFORMIN (GLUCOPHAGE) 1000 MG tablet Take 1,000 mg by mouth 2 (two) times daily with a meal.   Yes Historical Provider, MD  methadone (DOLOPHINE) 10 MG tablet Take 1 tablet (10 mg total) by mouth 4 (four) times daily. Patient taking differently: Take 10 mg by mouth every 8 (eight) hours.  06/14/14  Yes Gatha Mayer, MD  metoprolol (LOPRESSOR) 50 MG tablet TAKE ONE TABLET TWICE DAILY 05/28/14  Yes Estill Dooms, MD  Multiple Vitamin (MULITIVITAMIN WITH MINERALS) TABS Take 1 tablet by mouth daily.   Yes Historical Provider, MD  nystatin-triamcinolone [facility] II) cream Apply sparingly to rash twice daily 04/11/14  Yes Estill Dooms, MD  pantoprazole (PROTONIX) 40 MG tablet TAKE 1 TABLET TWO TIMES DAILY. 05/23/14  Yes Estill Dooms, MD  polyethylene glycol (MIRALAX / GLYCOLAX) packet Take 17 g by mouth daily as needed for mild constipation or moderate constipation.   Yes Historical Provider, MD  torsemide (DEMADEX) 100 MG tablet One twice daily for diuretic Patient taking differently: Take 100 mg by mouth 2 (two) times daily. One twice daily for diuretic  02/14/14  Yes Estill Dooms, MD  traZODone (DESYREL) 150 MG tablet Take 2 tablets (300 mg total) by mouth at bedtime. 2 by mouth at bedtime 06/14/14  Yes Gatha Mayer, MD  atorvastatin (LIPITOR) 40 MG tablet take 1 tablet once daily Patient not taking: Reported on 08/10/2014 04/16/14   Estill Dooms, MD  lubiprostone (AMITIZA) 24 MCG capsule Take 1 capsule (24 mcg total) by mouth 2 (two) times daily with a meal. Patient not taking: Reported on 08/10/2014 04/26/14   Gatha Mayer, MD  silver sulfADIAZINE (SILVADENE) 1 % cream Apply to wounds daily. Patient not taking: Reported on 08/10/2014 07/24/14   Estill Dooms, MD    Allergies:   Allergies  Allergen Reactions  . Adhesive [Tape] Other (See Comments)  Unknown paper tape ok to use   . Feldene [Piroxicam] Other (See Comments)    unknown  . Latex Other (See Comments)    Rash and itching    Social History:  reports that he has never smoked. He does not have any smokeless tobacco history on file. He reports that he does not drink alcohol or use illicit drugs.  Family History: Family History  Problem Relation Age of Onset  . Heart disease Father   . Skin cancer Father   . COPD Father   . Hypertension Father   . Heart disease Maternal Uncle   . High blood pressure Sister   . High blood pressure Sister   . High blood pressure Son   . Diabetes Daughter   . Depression Son   . Depression Son   . Stroke Father   . Arthritis Mother   . Arthritis Sister   . Arthritis Sister   . Osteoporosis Mother   . Cancer Maternal Grandmother   . Heart disease Maternal Uncle   . Heart disease Paternal Uncle     Physical Exam: Filed Vitals:   08/10/14 1936 08/10/14 2045 08/10/14 2100  BP: 134/76 135/61 140/76  Pulse: 77 84 76  Temp: 98.1 F (36.7 C)    TempSrc: Oral    Resp: 20 17 17   Height: 5\' 9"  (1.753 m)    Weight: 182.346 kg (402 lb)    SpO2: 100% 97% 98%   General appearance: alert, cooperative and no distress Head:  Normocephalic, without obvious abnormality, atraumatic Eyes: negative Nose: Nares normal. Septum midline. Mucosa normal. No drainage or sinus tenderness. Neck: no JVD and supple, symmetrical, trachea midline Lungs: clear to auscultation bilaterally Heart: regular rate and rhythm, S1, S2 normal, no murmur, click, rub or gallop Abdomen: soft, non-tender; bowel sounds normal; no masses,  no organomegaly Extremities: edema 3-4 + pitting edema ble 2/3 up with erythema bilateral and vesicles some ruptured Pulses: 2+ and symmetric Skin: Skin color, texture, turgor normal. No rashes or lesions  Other than erythema noted above to ble c/w cellulitis Neurologic: Grossly normal   Labs on Admission:   Recent Labs  08/10/14 1952  NA 137  K 4.1  CL 98*  CO2 26  GLUCOSE 208*  BUN 18  CREATININE 1.00  CALCIUM 9.1    Recent Labs  08/10/14 1952  WBC 6.9  HGB 11.1*  HCT 35.8*  MCV 87.1  PLT 234    Radiological Exams on Admission: Dg Chest 2 View  08/10/2014   CLINICAL DATA:  Short of breath today.  Lower extremity swelling.  EXAM: CHEST  2 VIEW  COMPARISON:  12/07/2013  FINDINGS: Cardiac silhouette is normal in size. No mediastinal or hilar masses or evidence of adenopathy. Mild stable reticular scarring or subsegmental atelectasis at the left lateral lung base. Lungs otherwise clear. No pleural effusion or pneumothorax. Stable surgical clips are noted in the left upper quadrant.  IMPRESSION: No active cardiopulmonary disease.   Electronically Signed   By: Lajean Manes M.D.   On: 08/10/2014 20:17   Old records reviewed cxr reviewed by myself   Assessment/Plan  64 yo male with worsening ble edema and ble cellulitis  Principal Problem:   Lower extremity cellulitis-  Pt has not been on oral antibiotics.  Place on iv vanco.  Place on iv lasix 80mg  q 12 hours.  He reports he is taking his chronic diuretics.  Elevate legs.  Obtain wound consult. Monitor renal function closely with iv  diuretics.  Active Problems:   HTN (hypertension)-  stable   OSA (obstructive sleep apnea)- noted   Chronic pain syndrome- cont home pain meds   DM type 2, uncontrolled, with neuropathy-  Cont lantus, place on SSI sensitive scale   PVD (peripheral vascular disease)- noted, pulses intact ble   Congestive heart disease-  No evidence of decompensation at this time, monitor for any respiratory compromise however during hospitalization.  AFVSS with o2 sats at 100% RA.  Daily wts and accurate i/os ordered.   Chronic Edema ble-  Iv lasix, treat underlying infection  Observe on medical bed.  Will likely need HH/PT.  Obtain PT evaluation.  FULL CODE. DAVID,RACHAL A 08/10/2014, 9:32 PM

## 2014-08-10 NOTE — ED Notes (Signed)
IV team at bedside 

## 2014-08-10 NOTE — Telephone Encounter (Signed)
Patient called with bilateral increased swelling and pus running down leg. Patient left legs unwrapped. Patient was in [place] helping rebuilding a house, doing a lot of manual labor and this may have resulted to his current symptoms.   [facility] in [place] in [place]

## 2014-08-10 NOTE — Progress Notes (Signed)
Pt admitted from the ED with cellulitis in bilateral lower extremities, pt settled in room,admission doc paged for orders,call light within pt's reach, will however continue to monitor. Obasogie-Asidi, Siddiq Kaluzny Efe

## 2014-08-10 NOTE — ED Notes (Signed)
2 RNs have attempted IVs on this patient.  Pt sts he has had to have IVs in his feet/legs in the past due to poor access.  IV team consult placed.

## 2014-08-10 NOTE — Telephone Encounter (Signed)
Per Dr.Reed patient needs to seek medical attention at Urgent Care. Patient aware of response and ok'd that he will go to Urgent Care

## 2014-08-10 NOTE — ED Notes (Signed)
Third RN attempted IV access without success.

## 2014-08-11 DIAGNOSIS — E1140 Type 2 diabetes mellitus with diabetic neuropathy, unspecified: Secondary | ICD-10-CM | POA: Diagnosis not present

## 2014-08-11 DIAGNOSIS — L03119 Cellulitis of unspecified part of limb: Secondary | ICD-10-CM

## 2014-08-11 DIAGNOSIS — I10 Essential (primary) hypertension: Secondary | ICD-10-CM

## 2014-08-11 DIAGNOSIS — G894 Chronic pain syndrome: Secondary | ICD-10-CM | POA: Diagnosis not present

## 2014-08-11 DIAGNOSIS — E1165 Type 2 diabetes mellitus with hyperglycemia: Secondary | ICD-10-CM

## 2014-08-11 LAB — GLUCOSE, CAPILLARY
GLUCOSE-CAPILLARY: 146 mg/dL — AB (ref 65–99)
GLUCOSE-CAPILLARY: 204 mg/dL — AB (ref 65–99)
Glucose-Capillary: 225 mg/dL — ABNORMAL HIGH (ref 65–99)
Glucose-Capillary: 247 mg/dL — ABNORMAL HIGH (ref 65–99)

## 2014-08-11 MED ORDER — INSULIN GLARGINE 100 UNIT/ML SOLOSTAR PEN: 18.0000 [IU] | PEN_INJECTOR | Freq: Every morning | SUBCUTANEOUS | Status: DC

## 2014-08-11 MED ORDER — POLYETHYLENE GLYCOL 3350 17 G PO PACK
17.0000 g | PACK | Freq: Every day | ORAL | Status: DC | PRN
  Filled 2014-08-11: qty 1

## 2014-08-11 MED ORDER — TRAZODONE HCL 150 MG PO TABS
300.0000 mg | ORAL_TABLET | Freq: Every day | ORAL | Status: DC
  Administered 2014-08-11 – 2014-08-12 (×2): 300 mg via ORAL
  Filled 2014-08-11 (×3): qty 2

## 2014-08-11 MED ORDER — INSULIN GLARGINE 100 UNIT/ML ~~LOC~~ SOLN
18.0000 [IU] | Freq: Every day | SUBCUTANEOUS | Status: DC
  Administered 2014-08-11 – 2014-08-13 (×3): 18 [IU] via SUBCUTANEOUS
  Filled 2014-08-11 (×3): qty 0.18

## 2014-08-11 MED ORDER — LISINOPRIL 20 MG PO TABS
20.0000 mg | ORAL_TABLET | Freq: Every day | ORAL | Status: DC
  Administered 2014-08-11 – 2014-08-13 (×3): 20 mg via ORAL
  Filled 2014-08-11 (×3): qty 1

## 2014-08-11 NOTE — Progress Notes (Signed)
[facility] PROGRESS NOTE   Assessment/Plan: *Lower extremity cellulitis - Continue IV antibiotics, he has remained afebrile. - I agree with IV Lasix. Will apply Ace bandages.  DM type 2, uncontrolled, with neuropathy - Start current home regimen.  Chronic diastolic heart disease - Recent home regimen. - His torsemide was changed to Lasix IV, fluid restrict him.  Chronic pain syndrome - Narcotics for pain.  Essential  HTN (hypertension) - Resume home regimen except Norvasc which can contribute to lower extremity edema.    OSA (obstructive sleep apnea) - C-pap at night    Code Status: full Family Communication: none  Disposition Plan: home in 2 days   Consultants:  None  Procedures:  none  Antibiotics:  vanc  HPI/Subjective: Relates his lower extremity still hurt.  Objective: Filed Vitals:   08/10/14 2045 08/10/14 2100 08/10/14 2309 08/11/14 0353  BP: 135/61 140/76 147/83 101/52  Pulse: 84 76 84 72  Temp:   98.6 F (37 C) 98.3 F (36.8 C)  TempSrc:   Oral Oral  Resp: 17 17  18   Height:   5\' 9"  (1.753 m)   Weight:   179.2 kg (395 lb 1 oz)   SpO2: 97% 98% 97% 98%    Intake/Output Summary (Last 24 hours) at 08/11/14 1009 Last data filed at 08/11/14 0651  Gross per 24 hour  Intake      0 ml  Output    700 ml  Net   -700 ml   Filed Weights   08/10/14 1936 08/10/14 2309  Weight: 182.346 kg (402 lb) 179.2 kg (395 lb 1 oz)    Exam:  General: Alert, awake, oriented x3, in no acute distress.  HEENT: No bruits, no goiter.  Heart: Regular rate and rhythm. Lungs: Good air movement, clear Abdomen: Soft, nontender, nondistended, positive bowel sounds.  Neuro: Grossly intact, nonfocal. Skin: With crusted purulent drainage. Erythema warm to touch and tender to touch.   Data Reviewed: Basic Metabolic Panel:  Recent Labs Lab 08/10/14 1952  NA 137  K 4.1  CL 98*  CO2 26  GLUCOSE 208*  BUN 18  CREATININE 1.00  CALCIUM 9.1   Liver  Function Tests: No results for input(s): AST, ALT, ALKPHOS, BILITOT, PROT, ALBUMIN in the last 168 hours. No results for input(s): LIPASE, AMYLASE in the last 168 hours. No results for input(s): AMMONIA in the last 168 hours. CBC:  Recent Labs Lab 08/10/14 1952  WBC 6.9  HGB 11.1*  HCT 35.8*  MCV 87.1  PLT 234   Cardiac Enzymes: No results for input(s): CKTOTAL, CKMB, CKMBINDEX, TROPONINI in the last 168 hours. BNP (last 3 results)  Recent Labs  08/10/14 1952  BNP 42.0    ProBNP (last 3 results)  Recent Labs  12/07/13 1223  PROBNP 61.8    CBG:  Recent Labs Lab 08/10/14 2048 08/10/14 2334  GLUCAP 235* 192*    No results found for this or any previous visit (from the past 240 hour(s)).   Studies: Dg Chest 2 View  08/10/2014   CLINICAL DATA:  Short of breath today.  Lower extremity swelling.  EXAM: CHEST  2 VIEW  COMPARISON:  12/07/2013  FINDINGS: Cardiac silhouette is normal in size. No mediastinal or hilar masses or evidence of adenopathy. Mild stable reticular scarring or subsegmental atelectasis at the left lateral lung base. Lungs otherwise clear. No pleural effusion or pneumothorax. Stable surgical clips are noted in the left upper quadrant.  IMPRESSION: No active cardiopulmonary disease.  Electronically Signed   By: Lajean Manes M.D.   On: 08/10/2014 20:17    Scheduled Meds: . furosemide  80 mg Intravenous BID  . gabapentin  1,600 mg Oral BID   And  . gabapentin  800 mg Oral Q1200  . insulin aspart  0-9 Units Subcutaneous TID WC  . methadone  10 mg Oral TID AC & HS  . metoprolol  50 mg Oral BID  . sodium chloride  3 mL Intravenous Q12H  . vancomycin  1,500 mg Intravenous Q12H   Continuous Infusions:   Time Spent: 25 min   Charlynne Cousins  [facility] Pager [phone]. If 7PM-7AM, please contact night-coverage at [email or web address], password [facility] 08/11/2014, 10:09 AM

## 2014-08-11 NOTE — Evaluation (Signed)
Physical Therapy Evaluation Patient Details Name: Phillip Maldonado MRN: [id number] DOB: [birth year] Today's Date: 08/11/2014   History of Present Illness  64 yo male h/o morbid obesity, OSA (noncompliant with cpap), chf, chronic le edema, htn, iddm comes in with one week of worsening ble swelling and redness. He went on vacation last week and that's when they got bad. Normally has swelling always in his legs, but much worse now, started developing some skin breakdown and now they are red. Denies any fevers. He saw his pcp in the last couple of days who started him on mycolog and silvadene cream. No oral antibiotics. Pt sleeps in recliner every night with legs propped up. Some sob a little worse than normal. No n/v/d. No chest pain. No leg pain. We are asked to admit for cellulitis and worsening ble edema  Clinical Impression  Pt admitted with/for LE cellulitis, but also deconditioned.  Pt currently limited functionally due to the problems listed below.  (see problems list.)  Pt will benefit from PT to maximize function and safety to be able to get home safely with available assist..     Follow Up Recommendations Home health PT    Equipment Recommendations  None recommended by PT    Recommendations for Other Services       Precautions / Restrictions Precautions Precautions: Fall      Mobility  Bed Mobility               General bed mobility comments: not tested  Transfers Overall transfer level: Needs assistance Equipment used: Straight cane Transfers: Sit to/from Stand Sit to Stand: Supervision         General transfer comment: Powers up well  Ambulation/Gait Ambulation/Gait assistance: Supervision Ambulation Distance (Feet): 100 Feet Assistive device: Straight cane Gait Pattern/deviations: Step-through pattern Gait velocity: slower Gait velocity interpretation: Below normal speed for age/gender General Gait Details: slow but stable gait with cane.   More effortful and verbally more painful as he progresses.  Stairs            Wheelchair Mobility    Modified Rankin (Stroke Patients Only)       Balance Overall balance assessment: Needs assistance Sitting-balance support: No upper extremity supported Sitting balance-Leahy Scale: Good       Standing balance-Leahy Scale: Good Standing balance comment: appears to have a good sense of balance, but neuropathy makes it more difficult, uses cane well to compensate.                             Pertinent Vitals/Pain Pain Assessment: Faces Faces Pain Scale: Hurts little more Pain Location: LE pain,  buttock pain in standing Pain Descriptors / Indicators: Aching;Throbbing Pain Intervention(s): Monitored during session;Repositioned    Home Living Family/patient expects to be discharged to:: Private residence Living Arrangements: Alone Available Help at Discharge: Family;Available PRN/intermittently Type of Home: Apartment Home Access: Level entry     Home Layout: One level Home Equipment: Cane - single point;Walker - 4 wheels;Adaptive equipment Additional Comments: Pt reports that his 2 minor children (14 and 12) stay with him every other weekend in the summer.     Prior Function Level of Independence: Independent with assistive device(s);Needs assistance   Gait / Transfers Assistance Needed: Uses cane or rollator for ambulation.  ADL's / Homemaking Assistance Needed: Pt reports that a woman comes every other week to clean the apartment. Pt reports increasing difficulty with IADLs (especially cooking) and  reports that it is especially difficult when his children stay with him.         Hand Dominance   Dominant Hand: Right    Extremity/Trunk Assessment   Upper Extremity Assessment: Overall WFL for tasks assessed           Lower Extremity Assessment: Overall WFL for tasks assessed (mild proximal LE weakness and truncal weakness)      Cervical /  Trunk Assessment: Kyphotic (most of spine fused.)  Communication   Communication: No difficulties  Cognition Arousal/Alertness: Awake/alert Behavior During Therapy: WFL for tasks assessed/performed Overall Cognitive Status: Within Functional Limits for tasks assessed                      General Comments      Exercises        Assessment/Plan    PT Assessment Patient needs continued PT services  PT Diagnosis Generalized weakness;Abnormality of gait   PT Problem List Decreased strength;Decreased activity tolerance;Decreased balance;Decreased mobility;Obesity;Pain  PT Treatment Interventions Gait training;Functional mobility training;Therapeutic activities;Balance training;Patient/family education   PT Goals (Current goals can be found in the Care Plan section) Acute Rehab PT Goals Patient Stated Goal: be able to do things with my children this summer. PT Goal Formulation: With patient Time For Goal Achievement: 08/18/14 Potential to Achieve Goals: Good    Frequency Min 3X/week   Barriers to discharge Decreased caregiver support      Co-evaluation               End of Session   Activity Tolerance: Patient tolerated treatment well;Patient limited by fatigue Patient left: with call bell/phone within reach;in chair Nurse Communication: Mobility status         Time: 4665-9935 PT Time Calculation (min) (ACUTE ONLY): 30 min   Charges:   PT Evaluation $Initial PT Evaluation Tier I: 1 Procedure PT Treatments $Gait Training: 8-22 mins   PT G Codes:        Nesreen Albano, Tessie Fass 08/11/2014, 10:37 AM 08/11/2014  Donnella Sham, [place] [phone]  (pager)

## 2014-08-12 DIAGNOSIS — L03119 Cellulitis of unspecified part of limb: Secondary | ICD-10-CM | POA: Diagnosis not present

## 2014-08-12 DIAGNOSIS — E1140 Type 2 diabetes mellitus with diabetic neuropathy, unspecified: Secondary | ICD-10-CM | POA: Diagnosis not present

## 2014-08-12 DIAGNOSIS — I10 Essential (primary) hypertension: Secondary | ICD-10-CM | POA: Diagnosis not present

## 2014-08-12 DIAGNOSIS — E1165 Type 2 diabetes mellitus with hyperglycemia: Secondary | ICD-10-CM | POA: Diagnosis not present

## 2014-08-12 LAB — GLUCOSE, CAPILLARY
GLUCOSE-CAPILLARY: 215 mg/dL — AB (ref 65–99)
Glucose-Capillary: 163 mg/dL — ABNORMAL HIGH (ref 65–99)
Glucose-Capillary: 237 mg/dL — ABNORMAL HIGH (ref 65–99)
Glucose-Capillary: 207 mg/dL — ABNORMAL HIGH (ref 65–99)

## 2014-08-12 LAB — BASIC METABOLIC PANEL
Anion gap: 7 (ref 5–15)
BUN: 10 mg/dL (ref 6–20)
CO2: 31 mmol/L (ref 22–32)
Calcium: 9 mg/dL (ref 8.9–10.3)
Chloride: 100 mmol/L — ABNORMAL LOW (ref 101–111)
Creatinine, Ser: 0.86 mg/dL (ref 0.61–1.24)
GFR calc non Af Amer: >60 mL/min (ref >60)
GLUCOSE: 185 mg/dL — AB (ref 65–99)
Potassium: 3.9 mmol/L (ref 3.5–5.1)
SODIUM: 138 mmol/L (ref 135–145)

## 2014-08-12 MED ORDER — METOLAZONE 2.5 MG PO TABS
2.5000 mg | ORAL_TABLET | Freq: Once | ORAL | Status: AC
Start: 2014-08-12 — End: 2014-08-12
  Administered 2014-08-12: 2.5 mg via ORAL
  Filled 2014-08-12: qty 1

## 2014-08-12 MED ORDER — ENOXAPARIN SODIUM 80 MG/0.8ML ~~LOC~~ SOLN
80.0000 mg | SUBCUTANEOUS | Status: DC
  Administered 2014-08-12: 80 mg via SUBCUTANEOUS
  Filled 2014-08-12 (×2): qty 0.8

## 2014-08-12 MED ORDER — DOXYCYCLINE HYCLATE 100 MG IV SOLR
100.0000 mg | Freq: Two times a day (BID) | INTRAVENOUS | Status: DC
  Filled 2014-08-12: qty 100

## 2014-08-12 MED ORDER — DOXYCYCLINE HYCLATE 100 MG PO TABS
100.0000 mg | ORAL_TABLET | Freq: Two times a day (BID) | ORAL | Status: DC
  Administered 2014-08-12 – 2014-08-13 (×3): 100 mg via ORAL
  Filled 2014-08-12 (×4): qty 1

## 2014-08-12 NOTE — Progress Notes (Signed)
[facility] PROGRESS NOTE   Assessment/Plan: Lower extremity cellulitis - Started empirically on IV vancomycin, has remained afebrile with no leukocytosis. We'll de-escalate to doxycycline orally. - I agree with IV Lasix.  - Will apply Ace bandages.  DM type 2, uncontrolled, with neuropathy - Start current home regimen.  Chronic diastolic heart disease - Recent home regimen. - His torsemide was changed to Lasix IV, fluid restrict him.  Chronic pain syndrome - Narcotics for pain.  Essential  HTN (hypertension) - Resume home regimen except Norvasc which can contribute to lower extremity edema.    OSA (obstructive sleep apnea) - C-pap at night    Code Status: full Family Communication: none  Disposition Plan: home in 2 days   Consultants:  None  Procedures:  none  Antibiotics:  vanc  HPI/Subjective: He relates his lower extremity pain feels much better than yesterday  Objective: Filed Vitals:   08/11/14 1110 08/11/14 1750 08/11/14 2045 08/12/14 0405  BP: 136/68 139/76 142/47 122/64  Pulse: 74 71 67 67  Temp:  97.9 F (36.6 C) 98.4 F (36.9 C) 98.1 F (36.7 C)  TempSrc:  Oral Oral Oral  Resp:  18 19 17   Height:      Weight:    176.676 kg (389 lb 8 oz)  SpO2:  100% 100% 96%    Intake/Output Summary (Last 24 hours) at 08/12/14 1123 Last data filed at 08/11/14 2030  Gross per 24 hour  Intake    250 ml  Output      0 ml  Net    250 ml   Filed Weights   08/10/14 1936 08/10/14 2309 08/12/14 0405  Weight: 182.346 kg (402 lb) 179.2 kg (395 lb 1 oz) 176.676 kg (389 lb 8 oz)    Exam:  General: Alert, awake, oriented x3, in no acute distress.  HEENT: No bruits, no goiter.  Heart: Regular rate and rhythm. Lungs: Good air movement, clear Abdomen: Soft, nontender, nondistended, positive bowel sounds.  Neuro: Grossly intact, nonfocal. Skin: With crusted purulent drainage. Erythema warm to touch and tender to touch.   Data Reviewed: Basic  Metabolic Panel:  Recent Labs Lab 08/10/14 1952 08/12/14 0429  NA 137 138  K 4.1 3.9  CL 98* 100*  CO2 26 31  GLUCOSE 208* 185*  BUN 18 10  CREATININE 1.00 0.86  CALCIUM 9.1 9.0   Liver Function Tests: No results for input(s): AST, ALT, ALKPHOS, BILITOT, PROT, ALBUMIN in the last 168 hours. No results for input(s): LIPASE, AMYLASE in the last 168 hours. No results for input(s): AMMONIA in the last 168 hours. CBC:  Recent Labs Lab 08/10/14 1952  WBC 6.9  HGB 11.1*  HCT 35.8*  MCV 87.1  PLT 234   Cardiac Enzymes: No results for input(s): CKTOTAL, CKMB, CKMBINDEX, TROPONINI in the last 168 hours. BNP (last 3 results)  Recent Labs  08/10/14 1952  BNP 42.0    ProBNP (last 3 results)  Recent Labs  12/07/13 1223  PROBNP 61.8    CBG:  Recent Labs Lab 08/11/14 0544 08/11/14 1121 08/11/14 1625 08/11/14 2059 08/12/14 0557  GLUCAP 146* 204* 225* 247* 163*    No results found for this or any previous visit (from the past 240 hour(s)).   Studies: Dg Chest 2 View  08/10/2014   CLINICAL DATA:  Short of breath today.  Lower extremity swelling.  EXAM: CHEST  2 VIEW  COMPARISON:  12/07/2013  FINDINGS: Cardiac silhouette is normal in size. No mediastinal or  hilar masses or evidence of adenopathy. Mild stable reticular scarring or subsegmental atelectasis at the left lateral lung base. Lungs otherwise clear. No pleural effusion or pneumothorax. Stable surgical clips are noted in the left upper quadrant.  IMPRESSION: No active cardiopulmonary disease.   Electronically Signed   By: Lajean Manes M.D.   On: 08/10/2014 20:17    Scheduled Meds: . furosemide  80 mg Intravenous BID  . gabapentin  1,600 mg Oral BID   And  . gabapentin  800 mg Oral Q1200  . insulin aspart  0-9 Units Subcutaneous TID WC  . insulin glargine  18 Units Subcutaneous Daily  . lisinopril  20 mg Oral Daily  . methadone  10 mg Oral TID AC & HS  . metoprolol  50 mg Oral BID  . sodium chloride  3  mL Intravenous Q12H  . traZODone  300 mg Oral QHS  . vancomycin  1,500 mg Intravenous Q12H   Continuous Infusions:   Time Spent: 25 min   Charlynne Cousins  [facility] Pager [phone]. If 7PM-7AM, please contact night-coverage at [email or web address], password [facility] 08/12/2014, 11:23 AM

## 2014-08-12 NOTE — Consult Note (Signed)
WOC wound consult note Reason for Consult:Bilateral edema and erythema. Wound type:venous insufficiency Pressure Ulcer POA: No Measurement: No open wounds at this time.  Pinpoint open areas that were previously weeping serous fluid have resolved with elevation. LEs remain edematous, with erythema at the gaiter area.  Some hemosiderin staining present. Wound LTJ:QZES Drainage (amount, consistency, odor) None at this time. Periwound:See above. Dressing procedure/placement/frequency: I have written orders for the application of Unna's boots to be applied bilaterally by the ortho tech staff and understand that Nursing will need to first obtain Coban dressings (wraps) to place on top of the Unna's Boots before that Nursing staff can page ortho tech.  These are to be changed twice weekly for the time being.  Patient has been treated by both HHRNs and the outpatient wound care center at [facility] for this problem tin the past.  He currently has an appointment to be seen at the outpatient wound care center at [facility].  He has been provided knee-high compression hosiery in the past, but cannot don nor doff them due to his neuropathic hands. [facility] nursing team will not follow, but will remain available to this patient, the nursing and medical team.  Please re-consult if needed. Thanks, Maudie Flakes, MSN, RN, [place], [place], [place] [phone])

## 2014-08-12 NOTE — Progress Notes (Signed)
Orthopedic Tech Progress Note Patient Details:  Phillip Maldonado 1950/05/20 [id number]  Ortho Devices Type of Ortho Device: Louretta Parma boot Ortho Device/Splint Location: bilateral Ortho Device/Splint Interventions: Application   Kemuel Buchmann 08/12/2014, 2:50 PM

## 2014-08-13 DIAGNOSIS — L03116 Cellulitis of left lower limb: Secondary | ICD-10-CM

## 2014-08-13 DIAGNOSIS — E1140 Type 2 diabetes mellitus with diabetic neuropathy, unspecified: Secondary | ICD-10-CM | POA: Diagnosis not present

## 2014-08-13 DIAGNOSIS — L03115 Cellulitis of right lower limb: Secondary | ICD-10-CM | POA: Diagnosis present

## 2014-08-13 DIAGNOSIS — G894 Chronic pain syndrome: Secondary | ICD-10-CM | POA: Diagnosis not present

## 2014-08-13 DIAGNOSIS — L03119 Cellulitis of unspecified part of limb: Secondary | ICD-10-CM | POA: Diagnosis not present

## 2014-08-13 DIAGNOSIS — I10 Essential (primary) hypertension: Secondary | ICD-10-CM | POA: Diagnosis not present

## 2014-08-13 LAB — BASIC METABOLIC PANEL
Anion gap: 12 (ref 5–15)
BUN: 17 mg/dL (ref 6–20)
CALCIUM: 9.2 mg/dL (ref 8.9–10.3)
CHLORIDE: 92 mmol/L — AB (ref 101–111)
CO2: 31 mmol/L (ref 22–32)
Creatinine, Ser: 1.08 mg/dL (ref 0.61–1.24)
GFR calc Af Amer: >60 mL/min (ref >60)
Glucose, Bld: 180 mg/dL — ABNORMAL HIGH (ref 65–99)
Potassium: 3.6 mmol/L (ref 3.5–5.1)
SODIUM: 135 mmol/L (ref 135–145)

## 2014-08-13 LAB — GLUCOSE, CAPILLARY
GLUCOSE-CAPILLARY: 190 mg/dL — AB (ref 65–99)
Glucose-Capillary: 224 mg/dL — ABNORMAL HIGH (ref 65–99)

## 2014-08-13 MED ORDER — DOXYCYCLINE HYCLATE 100 MG PO TABS: 100.0000 mg | ORAL_TABLET | Freq: Two times a day (BID) | ORAL | Status: DC

## 2014-08-13 MED ORDER — TORSEMIDE 100 MG PO TABS
100.0000 mg | ORAL_TABLET | Freq: Two times a day (BID) | ORAL | Status: DC
  Filled 2014-08-13 (×3): qty 1

## 2014-08-13 MED ORDER — FLUOXETINE HCL 10 MG PO CAPS: 10.0000 mg | ORAL_CAPSULE | Freq: Every day | ORAL | Status: DC

## 2014-08-13 NOTE — Progress Notes (Signed)
ANTICOAGULATION CONSULT NOTE - Follow Up Consult  Pharmacy Consult for lovenox Indication: VTE prophylaxis  Allergies  Allergen Reactions  . Adhesive [Tape] Other (See Comments)    Unknown paper tape ok to use   . Feldene [Piroxicam] Other (See Comments)    unknown  . Latex Other (See Comments)    Rash and itching    Patient Measurements: Height: 5\' 9"  (175.3 cm) Weight: (!) 381 lb 11.2 oz (173.138 kg) IBW/kg (Calculated) : 70.7   Vital Signs: Temp: 97.6 F (36.4 C) (05/30 0400) Temp Source: Oral (05/30 0400) BP: 118/55 mmHg (05/30 0400) Pulse Rate: 65 (05/30 0400)  Labs:  Recent Labs  08/10/14 1952 08/12/14 0429 08/13/14 0346  HGB 11.1*  --   --   HCT 35.8*  --   --   PLT 234  --   --   CREATININE 1.00 0.86 1.08    Estimated Creatinine Clearance: 110.6 mL/min (by C-G formula based on Cr of 1.08).   Medications:  Prescriptions prior to admission  Medication Sig Dispense Refill Last Dose  . acetaminophen (TYLENOL) 650 MG CR tablet Take 1,950 mg by mouth 2 (two) times daily.   08/10/2014 at Unknown time  . amLODipine (NORVASC) 10 MG tablet Take 10 mg by mouth daily.   08/10/2014 at Unknown time  . DULoxetine (CYMBALTA) 60 MG capsule One twice daily to help neuropathy (Patient taking differently: Take 60 mg by mouth 2 (two) times daily. One twice daily to help neuropathy) 60 capsule 5 08/10/2014 at Unknown time  . gabapentin (NEURONTIN) 800 MG tablet Take 2 tablets 3 times daily to help neuropathy (Patient taking differently: Take 800-1,600 mg by mouth 3 (three) times daily. Take 2 tablets in the morning, one tablet at lunch, and 2 tablets in the evening.) 180 tablet 3 08/10/2014 at Unknown time  . glucose blood (ACCU-CHEK AVIVA PLUS) test strip 1 each by Other route See admin instructions. Check blood sugar twice daily   unknown  . Insulin Glargine (LANTUS SOLOSTAR) 100 UNIT/ML Solostar Pen INJECT 18 UNITS EVERY MORNING 3 mL 3 08/10/2014 at Unknown time  . Insulin Pen  Needle (NOVOFINE) 30G X 8 MM MISC Inject 1 packet into the skin See admin instructions. Use with insulin once daily.   unknown  . lisinopril (PRINIVIL,ZESTRIL) 20 MG tablet One daily to control BP and strengthen the heart (Patient taking differently: TAKE 20 MG BY MOUTH DAILY) 30 tablet 5 08/10/2014 at Unknown time  . metFORMIN (GLUCOPHAGE) 1000 MG tablet Take 1,000 mg by mouth 2 (two) times daily with a meal.   08/10/2014 at Unknown time  . methadone (DOLOPHINE) 10 MG tablet Take 1 tablet (10 mg total) by mouth 4 (four) times daily. (Patient taking differently: Take 10 mg by mouth every 8 (eight) hours. ) 30 tablet 0 08/10/2014 at Unknown time  . metoprolol (LOPRESSOR) 50 MG tablet TAKE ONE TABLET TWICE DAILY 60 tablet 3 08/10/2014 at Unknown time  . Multiple Vitamin (MULITIVITAMIN WITH MINERALS) TABS Take 1 tablet by mouth daily.   08/10/2014 at Unknown time  . nystatin-triamcinolone (MYCOLOG II) cream Apply sparingly to rash twice daily 60 g 5 08/10/2014 at Unknown time  . pantoprazole (PROTONIX) 40 MG tablet TAKE 1 TABLET TWO TIMES DAILY. 60 tablet 5 08/10/2014 at Unknown time  . polyethylene glycol (MIRALAX / GLYCOLAX) packet Take 17 g by mouth daily as needed for mild constipation or moderate constipation.   1 week ago at Unknown time  . torsemide (DEMADEX) 100 MG  tablet One twice daily for diuretic (Patient taking differently: Take 100 mg by mouth 2 (two) times daily. One twice daily for diuretic) 60 tablet 5 08/09/2014 at Unknown time  . traZODone (DESYREL) 150 MG tablet Take 2 tablets (300 mg total) by mouth at bedtime. 2 by mouth at bedtime 30 tablet 0 08/09/2014 at Unknown time  . atorvastatin (LIPITOR) 40 MG tablet take 1 tablet once daily (Patient not taking: Reported on 08/10/2014) 30 tablet 5 Not Taking at Unknown time  . lubiprostone (AMITIZA) 24 MCG capsule Take 1 capsule (24 mcg total) by mouth 2 (two) times daily with a meal. (Patient not taking: Reported on 08/10/2014) 60 capsule 11 Not Taking  at Unknown time  . silver sulfADIAZINE (SILVADENE) 1 % cream Apply to wounds daily. (Patient not taking: Reported on 08/10/2014) 50 g 0 Not Taking at Unknown time    Assessment: 64 yo man on lovenox for VTE prophylaxis.  No complications noted. Goal of Therapy:  Monitor platelets by anticoagulation protocol: Yes   Plan:  Cont lovenox 80 mg sq q24 hours Monitor for complications  Phillip Maldonado 08/13/2014,7:39 AM

## 2014-08-13 NOTE — Progress Notes (Signed)
Physical Therapy Treatment Patient Details Name: Phillip Maldonado MRN: [id number] DOB: 09-22-1950 Today's Date: 08/13/2014    History of Present Illness 64 yo male h/o morbid obesity, OSA (noncompliant with cpap), chf, chronic le edema, htn, iddm comes in with one week of worsening ble swelling and redness. He went on vacation last week and that's when they got bad. Normally has swelling always in his legs, but much worse now, started developing some skin breakdown and now they are red. Denies any fevers. He saw his pcp in the last couple of days who started him on mycolog and silvadene cream. No oral antibiotics. Pt sleeps in recliner every night with legs propped up. Some sob a little worse than normal. No n/v/d. No chest pain. No leg pain. We are asked to admit for cellulitis and worsening ble edema    PT Comments    Pt progressing towards physical therapy goals. Was able to ambulate farther this session with use of SPC and standing rest breaks for energy conservation. Discussed home situation and pt was educated further on energy conservation, use of rollator (pt has one at home) and progression of walking program. Pt asking for a tub bench which would be beneficial. Pt anticipates d/c home today with the support of his church members to get settled in his home. Pt agreeable to further therapy services at d/c.   Follow Up Recommendations  Home health PT     Equipment Recommendations  Other (comment) (Tub bench)    Recommendations for Other Services       Precautions / Restrictions Precautions Precautions: Fall Restrictions Weight Bearing Restrictions: No    Mobility  Bed Mobility               General bed mobility comments: Pt sitting up in the recliner upon PT arrival.   Transfers Overall transfer level: Needs assistance Equipment used: Straight cane Transfers: Sit to/from Stand Sit to Stand: Supervision         General transfer comment: Powers up  well  Ambulation/Gait Ambulation/Gait assistance: Supervision Ambulation Distance (Feet): 250 Feet Assistive device: Straight cane Gait Pattern/deviations: Step-through pattern;Decreased stride length;Trunk flexed Gait velocity: Decreased Gait velocity interpretation: Below normal speed for age/gender General Gait Details: Slow but stable gait with SPC. Pt took many standing rest breaks for energy conservation.    Stairs            Wheelchair Mobility    Modified Rankin (Stroke Patients Only)       Balance Overall balance assessment: Needs assistance Sitting-balance support: No upper extremity supported;Feet supported Sitting balance-Leahy Scale: Good       Standing balance-Leahy Scale: Good Standing balance comment: appears to have a good sense of balance, but neuropathy makes it more difficult, uses cane well to compensate.                    Cognition Arousal/Alertness: Awake/alert Behavior During Therapy: WFL for tasks assessed/performed Overall Cognitive Status: Within Functional Limits for tasks assessed                      Exercises      General Comments        Pertinent Vitals/Pain Pain Assessment: Faces Faces Pain Scale: Hurts a little bit Pain Location: LE's and buttocks when ambulating Pain Descriptors / Indicators: Burning Pain Intervention(s): Limited activity within patient's tolerance;Monitored during session;Repositioned    Home Living  Prior Function            PT Goals (current goals can now be found in the care plan section) Acute Rehab PT Goals Patient Stated Goal: be able to do things with my children this summer. PT Goal Formulation: With patient Time For Goal Achievement: 08/18/14 Potential to Achieve Goals: Good Progress towards PT goals: Progressing toward goals    Frequency  Min 3X/week    PT Plan Current plan remains appropriate    Co-evaluation             End  of Session Equipment Utilized During Treatment: Gait belt Activity Tolerance: Patient tolerated treatment well;Patient limited by fatigue Patient left: in chair;with call bell/phone within reach     Time: 1035-1108 PT Time Calculation (min) (ACUTE ONLY): 33 min  Charges:  $Gait Training: 8-22 mins $Therapeutic Activity: 8-22 mins                    G Codes:      Rolinda Roan Aug 15, 2014, 1:09 PM   Rolinda Roan, PT, DPT Acute Rehabilitation Services Pager: [phone]

## 2014-08-13 NOTE — Discharge Summary (Addendum)
Physician Discharge Summary  KYRI DAI WNI:[id number] DOB: 1950/07/15 DOA: 08/10/2014  PCP: Estill Dooms, MD  Admit date: 08/10/2014 Discharge date: 08/13/2014  Time spent: 35 minutes  Recommendations for Outpatient Follow-up:  1. PCP in one week for lower extremity cellulitis and venous insufficiency. 2. We'll need to refer to psychiatrist for his depression as is not improving on current medications.  Discharge Diagnoses:  Principal Problem:   Lower extremity cellulitis Active Problems:   HTN (hypertension)   OSA (obstructive sleep apnea)   Chronic pain syndrome   DM type 2, uncontrolled, with neuropathy   PVD (peripheral vascular disease)   Congestive heart disease   Anasarca   Edema   Cellulitis of both lower extremities   Discharge Condition: stable  Diet recommendation: carb  Filed Weights   08/10/14 2309 08/12/14 0405 08/13/14 0400  Weight: 179.2 kg (395 lb 1 oz) 176.676 kg (389 lb 8 oz) 173.138 kg (381 lb 11.2 oz)    History of present illness:  64 yo male h/o morbid obesity, OSA (noncompliant with cpap), chf, chronic le edema, htn, iddm comes in with one week of worsening ble swelling and redness. He went on vacation last week and that's when they got bad. Normally has swelling always in his legs, but much worse now, started developing some skin breakdown and now they are red. Denies any fevers. He saw his pcp in the last couple of days who started him on mycolog and silvadene cream. No oral antibiotics. Pt sleeps in recliner every night with legs propped up. Some sob a little worse than normal. No n/v/d. No chest pain. No leg pain. We are asked to admit for cellulitis and worsening ble edema.  Hospital Course:  Lower extremity cellulitis - Started empirically on IV vancomycin, plus erythema and redness improved initiation oral doxycycline. - His bandages were applied with improvement and changed twice a week. - Continue doxycycline for 1  week. - Follow-up with his PCP in one week, will recommend stopping Norvasc is indicating contributed to his lower extremity edema.  DM type 2, uncontrolled, with neuropathy -No changes were made to his current regimen.   Chronic diastolic heart disease -Is mildly overloaded on admission he was treated with 2 doses of IV Lasix. It then change to his home regimen.  We'll need to continue daily weights and strict is and os and fluid injection at home.  Chronic pain syndrome -No changes related to his medication.    Essential HTN (hypertension) - Resume home regimen except Norvasc which can contribute to lower extremity edema.   OSA (obstructive sleep apnea) - C-pap at night  Depression  Not suicidal ideation the patient will probably benefit from psychiatric evaluation to further titrates antidepressive medication.  Procedures:  CXR  Consultations:  none  Discharge Exam: Filed Vitals:   08/13/14 0400  BP: 118/55  Pulse: 65  Temp: 97.6 F (36.4 C)  Resp: 16    General: A&O x3 Cardiovascular: RRR Respiratory: good air movement CTA B/L  Discharge Instructions   Discharge Instructions    Diet - low sodium heart healthy    Complete by:  As directed      Increase activity slowly    Complete by:  As directed           Current Discharge Medication List    START taking these medications   Details  doxycycline (VIBRA-TABS) 100 MG tablet Take 1 tablet (100 mg total) by mouth every 12 (twelve) hours. Qty: 14  tablet, Refills: 0      CONTINUE these medications which have NOT CHANGED   Details  acetaminophen (TYLENOL) 650 MG CR tablet Take 1,950 mg by mouth 2 (two) times daily.    amLODipine (NORVASC) 10 MG tablet Take 10 mg by mouth daily.    DULoxetine (CYMBALTA) 60 MG capsule One twice daily to help neuropathy Qty: 60 capsule, Refills: 5   Associated Diagnoses: Chronic pain syndrome    gabapentin (NEURONTIN) 800 MG tablet Take 2 tablets 3 times daily to  help neuropathy Qty: 180 tablet, Refills: 3   Associated Diagnoses: Chronic pain syndrome    glucose blood (ACCU-CHEK AVIVA PLUS) test strip 1 each by Other route See admin instructions. Check blood sugar twice daily    Insulin Glargine (LANTUS SOLOSTAR) 100 UNIT/ML Solostar Pen INJECT 18 UNITS EVERY MORNING Qty: 3 mL, Refills: 3    Insulin Pen Needle (NOVOFINE) 30G X 8 MM MISC Inject 1 packet into the skin See admin instructions. Use with insulin once daily.    lisinopril (PRINIVIL,ZESTRIL) 20 MG tablet One daily to control BP and strengthen the heart Qty: 30 tablet, Refills: 5    metFORMIN (GLUCOPHAGE) 1000 MG tablet Take 1,000 mg by mouth 2 (two) times daily with a meal.    methadone (DOLOPHINE) 10 MG tablet Take 1 tablet (10 mg total) by mouth 4 (four) times daily. Qty: 30 tablet, Refills: 0    metoprolol (LOPRESSOR) 50 MG tablet TAKE ONE TABLET TWICE DAILY Qty: 60 tablet, Refills: 3    Multiple Vitamin (MULITIVITAMIN WITH MINERALS) TABS Take 1 tablet by mouth daily.    nystatin-triamcinolone (MYCOLOG II) cream Apply sparingly to rash twice daily Qty: 60 g, Refills: 5   Associated Diagnoses: Intertrigo    pantoprazole (PROTONIX) 40 MG tablet TAKE 1 TABLET TWO TIMES DAILY. Qty: 60 tablet, Refills: 5    polyethylene glycol (MIRALAX / GLYCOLAX) packet Take 17 g by mouth daily as needed for mild constipation or moderate constipation.    torsemide (DEMADEX) 100 MG tablet One twice daily for diuretic Qty: 60 tablet, Refills: 5   Associated Diagnoses: Anasarca    traZODone (DESYREL) 150 MG tablet Take 2 tablets (300 mg total) by mouth at bedtime. 2 by mouth at bedtime Qty: 30 tablet, Refills: 0    atorvastatin (LIPITOR) 40 MG tablet take 1 tablet once daily Qty: 30 tablet, Refills: 5    lubiprostone (AMITIZA) 24 MCG capsule Take 1 capsule (24 mcg total) by mouth 2 (two) times daily with a meal. Qty: 60 capsule, Refills: 11   Associated Diagnoses: Constipation due to opioid  therapy    silver sulfADIAZINE (SILVADENE) 1 % cream Apply to wounds daily. Qty: 50 g, Refills: 0   Associated Diagnoses: Skin tear of lower leg without complication, right, subsequent encounter; Ulcer of left lower leg, limited to breakdown of skin       Allergies  Allergen Reactions  . Adhesive [Tape] Other (See Comments)    Unknown paper tape ok to use   . Feldene [Piroxicam] Other (See Comments)    unknown  . Latex Other (See Comments)    Rash and itching   Follow-up Information    Follow up with GREEN, Viviann Spare, MD In 1 week.   Specialty:  Internal Medicine   Why:  Hospital follow-ups for cellulitis.   Contact information:   [place] [id number] [phone]        The results of significant diagnostics from this hospitalization (including imaging,  microbiology, ancillary and laboratory) are listed below for reference.    Significant Diagnostic Studies: Dg Chest 2 View  08/10/2014   CLINICAL DATA:  Short of breath today.  Lower extremity swelling.  EXAM: CHEST  2 VIEW  COMPARISON:  12/07/2013  FINDINGS: Cardiac silhouette is normal in size. No mediastinal or hilar masses or evidence of adenopathy. Mild stable reticular scarring or subsegmental atelectasis at the left lateral lung base. Lungs otherwise clear. No pleural effusion or pneumothorax. Stable surgical clips are noted in the left upper quadrant.  IMPRESSION: No active cardiopulmonary disease.   Electronically Signed   By: Lajean Manes M.D.   On: 08/10/2014 20:17    Microbiology: No results found for this or any previous visit (from the past 240 hour(s)).   Labs: Basic Metabolic Panel:  Recent Labs Lab 08/10/14 1952 08/12/14 0429 08/13/14 0346  NA 137 138 135  K 4.1 3.9 3.6  CL 98* 100* 92*  CO2 26 31 31   GLUCOSE 208* 185* 180*  BUN 18 10 17   CREATININE 1.00 0.86 1.08  CALCIUM 9.1 9.0 9.2   Liver Function Tests: No results for input(s): AST, ALT, ALKPHOS, BILITOT, PROT, ALBUMIN  in the last 168 hours. No results for input(s): LIPASE, AMYLASE in the last 168 hours. No results for input(s): AMMONIA in the last 168 hours. CBC:  Recent Labs Lab 08/10/14 1952  WBC 6.9  HGB 11.1*  HCT 35.8*  MCV 87.1  PLT 234   Cardiac Enzymes: No results for input(s): CKTOTAL, CKMB, CKMBINDEX, TROPONINI in the last 168 hours. BNP: BNP (last 3 results)  Recent Labs  08/10/14 1952  BNP 42.0    ProBNP (last 3 results)  Recent Labs  12/07/13 1223  PROBNP 61.8    CBG:  Recent Labs Lab 08/12/14 0557 08/12/14 1134 08/12/14 1631 08/12/14 2055 08/13/14 0616  GLUCAP 163* 237* 215* 207* 190*       Signed:  Charlynne Cousins  [facility] 08/13/2014, 10:11 AM

## 2014-08-13 NOTE — Care Management Note (Signed)
Case Management Note  Patient Details  Name: Phillip Maldonado MRN: [id number] Date of Birth: January 18, 1951  Subjective/Objective:         Lives at home alone.  Has good support with his church.  Set up for [facility] RN, PT,OT and NA through [facility].            Action/Plan:   Expected Discharge Date:   08-13-14              Expected Discharge Plan:  [facility]  In-House Referral:     Discharge planning Services     Post Acute Care Choice:    Choice offered to:     DME Arranged:    DME Agency:     HH Arranged:  PT, OT, RN, Nurse's Aide HH Agency:  [place]  Status of Service:  Completed, signed off  Medicare Important Message Given:  Yes Date Medicare IM Given:  08/13/14 Medicare IM give by:  Luz Lex, RNBSN Date Additional Medicare IM Given:    Additional Medicare Important Message give by:     If discussed at [place] of Stay Meetings, dates discussed:    Additional Comments:  Vergie Living, RN 08/13/2014, 10:42 AM

## 2014-08-14 ENCOUNTER — Other Ambulatory Visit: Payer: Self-pay | Admitting: Internal Medicine

## 2014-08-14 NOTE — Progress Notes (Signed)
UR Completed. Chee Kinslow, RN, BSN.  [phone] 

## 2014-08-14 NOTE — Progress Notes (Signed)
Late Entry Addendum for Initial Evaluation on 5/28    08/11/14 1026  PT Time Calculation  PT Start Time (ACUTE ONLY) 0952  PT Stop Time (ACUTE ONLY) 1022  PT Time Calculation (min) (ACUTE ONLY) 30 min  PT G-Codes **NOT FOR INPATIENT CLASS**  Functional Assessment Tool Used clinical judgement  Functional Limitation Mobility: Walking and moving around  Mobility: Walking and Moving Around Current Status (H2992) CI  Mobility: Walking and Moving Around Goal Status (E2683) CI  PT General Charges  $$ ACUTE PT VISIT 1 Procedure  PT Evaluation  $Initial PT Evaluation Tier I 1 Procedure  PT Treatments  $Gait Training 8-22 mins    08/14/2014  Donnella Sham, PT [phone]  (pager)

## 2014-08-15 ENCOUNTER — Ambulatory Visit (INDEPENDENT_AMBULATORY_CARE_PROVIDER_SITE_OTHER): Payer: Medicare Other | Admitting: Internal Medicine

## 2014-08-15 ENCOUNTER — Encounter: Payer: Self-pay | Admitting: Internal Medicine

## 2014-08-15 VITALS — BP 130/78 | HR 78 | Temp 98.3°F | Ht 69.0 in | Wt 386.0 lb

## 2014-08-15 DIAGNOSIS — R609 Edema, unspecified: Secondary | ICD-10-CM

## 2014-08-15 DIAGNOSIS — G894 Chronic pain syndrome: Secondary | ICD-10-CM | POA: Diagnosis not present

## 2014-08-15 DIAGNOSIS — IMO0002 Reserved for concepts with insufficient information to code with codable children: Secondary | ICD-10-CM

## 2014-08-15 DIAGNOSIS — G609 Hereditary and idiopathic neuropathy, unspecified: Secondary | ICD-10-CM | POA: Diagnosis not present

## 2014-08-15 DIAGNOSIS — I509 Heart failure, unspecified: Secondary | ICD-10-CM

## 2014-08-15 DIAGNOSIS — R601 Generalized edema: Secondary | ICD-10-CM

## 2014-08-15 DIAGNOSIS — R079 Chest pain, unspecified: Secondary | ICD-10-CM

## 2014-08-15 DIAGNOSIS — R55 Syncope and collapse: Secondary | ICD-10-CM | POA: Diagnosis not present

## 2014-08-15 DIAGNOSIS — M25519 Pain in unspecified shoulder: Secondary | ICD-10-CM | POA: Insufficient documentation

## 2014-08-15 DIAGNOSIS — E1165 Type 2 diabetes mellitus with hyperglycemia: Secondary | ICD-10-CM

## 2014-08-15 DIAGNOSIS — D649 Anemia, unspecified: Secondary | ICD-10-CM

## 2014-08-15 DIAGNOSIS — I10 Essential (primary) hypertension: Secondary | ICD-10-CM | POA: Diagnosis not present

## 2014-08-15 DIAGNOSIS — E1140 Type 2 diabetes mellitus with diabetic neuropathy, unspecified: Secondary | ICD-10-CM | POA: Diagnosis not present

## 2014-08-15 DIAGNOSIS — M25511 Pain in right shoulder: Secondary | ICD-10-CM | POA: Diagnosis not present

## 2014-08-15 DIAGNOSIS — L03119 Cellulitis of unspecified part of limb: Secondary | ICD-10-CM | POA: Diagnosis not present

## 2014-08-15 MED ORDER — GABAPENTIN 800 MG PO TABS: ORAL_TABLET | ORAL | Status: DC

## 2014-08-15 NOTE — Progress Notes (Signed)
Passed clock drawing 

## 2014-08-15 NOTE — Progress Notes (Signed)
Patient ID: Phillip Maldonado, male   DOB: 1950/08/01, 64 y.o.   MRN: [id number]    Facility  [facility]    Place of Service:   OFFICE    Allergies  Allergen Reactions  . Adhesive [Tape] Other (See Comments)    Unknown paper tape ok to use   . Feldene [Piroxicam] Other (See Comments)    unknown  . Latex Other (See Comments)    Rash and itching    Chief Complaint  Patient presents with  . Medical Management of Chronic Issues    3 month follow-up, MMSE 27/30 (passed clock drawing)   . Hospitalization Follow-up    Seen in hospital on 08/10/14 for leg swelling. Discuss RX for antibiotic, patient states rx was never given to him and it was not sent to pharmacy either.   . Dizziness    Patient c/o dizziness and balance issues     HPI:  Pre-syncope: Following discharge after hospitalization 2 days ago has been dizzy, lightheaded, short of breath with occasional chest pressure that radiates to left shoulder when standing or ambulating. Cannot stand for longer than 5 minutes at a time because feels will pass out. Fatigue and weakness as well. Struggling to keep eyes open on exam, not drowsy but feels 'worn out.'   Requests cardiology referral. Has seen Dr Doylene Canard in the past.  Cellulitis of lower extremity, unspecified laterality: Improved. Erythema remains, no warmth, tenderness or purulent drainage. No fevers or chills.  Anasarca: Improvement with Torsemide  Chronic congestive heart failure, unspecified congestive heart failure type: compensated  DM type 2, uncontrolled, with neuropathy: During hospitalization mealtime CBGs consistently 200-240.  Edema: Markedly improved from last visit, no weeping from LEs  Essential hypertension: Controlled    Medications: Patient's Medications  New Prescriptions   No medications on file  Previous Medications   ACETAMINOPHEN (TYLENOL) 650 MG CR TABLET    Take 1,950 mg by mouth 2 (two) times daily.   AMLODIPINE (NORVASC) 10 MG TABLET    Take  10 mg by mouth daily.   ATORVASTATIN (LIPITOR) 40 MG TABLET    take 1 tablet once daily   DOXYCYCLINE (VIBRA-TABS) 100 MG TABLET    Take 1 tablet (100 mg total) by mouth every 12 (twelve) hours.   DULOXETINE (CYMBALTA) 60 MG CAPSULE    One twice daily to help neuropathy   GABAPENTIN (NEURONTIN) 800 MG TABLET    Take 2 tablets 3 times daily to help neuropathy   GLUCOSE BLOOD (ACCU-CHEK AVIVA PLUS) TEST STRIP    1 each by Other route See admin instructions. Check blood sugar twice daily   INSULIN GLARGINE (LANTUS SOLOSTAR) 100 UNIT/ML SOLOSTAR PEN    INJECT 18 UNITS EVERY MORNING   INSULIN PEN NEEDLE (NOVOFINE) 30G X 8 MM MISC    Inject 1 packet into the skin See admin instructions. Use with insulin once daily.   LISINOPRIL (PRINIVIL,ZESTRIL) 20 MG TABLET    One daily to control BP and strengthen the heart   LUBIPROSTONE (AMITIZA) 24 MCG CAPSULE    Take 1 capsule (24 mcg total) by mouth 2 (two) times daily with a meal.   METFORMIN (GLUCOPHAGE) 1000 MG TABLET    Take 1,000 mg by mouth 2 (two) times daily with a meal.   METHADONE (DOLOPHINE) 10 MG TABLET    Take 1 tablet (10 mg total) by mouth 4 (four) times daily.   METOPROLOL (LOPRESSOR) 50 MG TABLET    TAKE ONE TABLET TWICE DAILY  MULTIPLE VITAMIN (MULITIVITAMIN WITH MINERALS) TABS    Take 1 tablet by mouth daily.   NYSTATIN-TRIAMCINOLONE (MYCOLOG II) CREAM    Apply sparingly to rash twice daily   PANTOPRAZOLE (PROTONIX) 40 MG TABLET    TAKE 1 TABLET TWO TIMES DAILY.   POLYETHYLENE GLYCOL (MIRALAX / GLYCOLAX) PACKET    Take 17 g by mouth daily as needed for mild constipation or moderate constipation.   SILVER SULFADIAZINE (SILVADENE) 1 % CREAM    Apply to wounds daily.   TORSEMIDE (DEMADEX) 100 MG TABLET    One twice daily for diuretic   TRAZODONE (DESYREL) 150 MG TABLET    Take 2 tablets (300 mg total) by mouth at bedtime. 2 by mouth at bedtime  Modified Medications   No medications on file  Discontinued Medications   No medications on file       Review of Systems  Constitutional: Positive for activity change and fatigue. Negative for fever, appetite change and unexpected weight change.       Morbidly obese  HENT: Negative for congestion, ear pain, hearing loss, rhinorrhea, sore throat, tinnitus, trouble swallowing and voice change.   Eyes:       Corrective lenses  Respiratory: Positive for shortness of breath. Negative for cough, choking, chest tightness and wheezing.   Cardiovascular: Positive for chest pain and leg swelling. Negative for palpitations.  Gastrointestinal: Negative for nausea, abdominal pain, diarrhea, constipation and abdominal distention.  Endocrine: Negative for cold intolerance, heat intolerance, polydipsia, polyphagia and polyuria.  Genitourinary: Negative for dysuria, urgency, frequency and testicular pain.       Not incontinent  Musculoskeletal: Positive for back pain. Negative for myalgias, arthralgias, gait problem and neck pain.  Skin: Positive for rash.       erythema of the anterior shins. History of intertrigo under the breasts and in the groin area bilaterally. There is some burning discomfort. It has improved. Blotchy, red facial rash present since Spring 2015. Chronic venous stasis changes of both lower legs.  Allergic/Immunologic: Negative.   Neurological: Positive for light-headedness. Negative for dizziness, tremors, syncope, speech difficulty, weakness, numbness and headaches.       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  Hematological: Negative for adenopathy. Does not bruise/bleed easily.  Psychiatric/Behavioral: Negative for hallucinations, behavioral problems, confusion, sleep disturbance and decreased concentration. The patient is not nervous/anxious.        Mild memory difficulty    Filed Vitals:   08/15/14 1424  BP: 130/78  Pulse: 78  Temp: 98.3 F (36.8 C)  TempSrc: Oral  Height: '5\' 9"'  (1.753  m)  Weight: 386 lb (175.088 kg)  SpO2: 93%   Body mass index is 56.98 kg/(m^2).  Physical Exam  Constitutional: He is oriented to person, place, and time. He appears well-developed and well-nourished. No distress.  Morbid obesity  HENT:  Right Ear: External ear normal.  Left Ear: External ear normal.  Nose: Nose normal.  Mouth/Throat: Oropharynx is clear and moist. No oropharyngeal exudate.  Eyes: Conjunctivae and EOM are normal. Pupils are equal, round, and reactive to light.  Neck: No JVD present. No tracheal deviation present. No thyromegaly present.  Cardiovascular: Normal rate, regular rhythm, normal heart sounds and intact distal pulses.  Exam reveals no gallop and no friction rub.   No murmur heard. Pulmonary/Chest: No respiratory distress. He has no wheezes. He has no rales. He exhibits no tenderness.  Abdominal: He exhibits  no distension and no mass. There is no tenderness.  Anasarca with intra-abdominal fluid.  Musculoskeletal: He exhibits edema. He exhibits no tenderness.  Using cane. Unstable gait.  Lymphadenopathy:    He has no cervical adenopathy.  Neurological: He is alert and oriented to person, place, and time. He has normal reflexes. No cranial nerve deficit. Coordination normal.  08/15/14 MMSE 27/30  Skin: Rash noted. There is erythema (both shins). No pallor.  Chronic venous stasis changes of both anterior legs. Erythematous intertrigo under the breasts and in the groin area bilaterally. Diffuse, blotchy facial rash. Blotches are about the size of the end of my finger. It does not seem to involve the scalp, but does cover the forehead and cheeks, chin, and anterior neck. Healing ulceration of the right anterior shin with moderate dried serous fluid.  Psychiatric: He has a normal mood and affect. His behavior is normal. Judgment and thought content normal.     Labs reviewed: Admission on 08/10/2014, Discharged on 08/13/2014  Component Date Value Ref Range Status   . Sodium 08/10/2014 137  135 - 145 mmol/L Final  . Potassium 08/10/2014 4.1  3.5 - 5.1 mmol/L Final  . Chloride 08/10/2014 98* 101 - 111 mmol/L Final  . CO2 08/10/2014 26  22 - 32 mmol/L Final  . Glucose, Bld 08/10/2014 208* 65 - 99 mg/dL Final  . BUN 08/10/2014 18  6 - 20 mg/dL Final  . Creatinine, Ser 08/10/2014 1.00  0.61 - 1.24 mg/dL Final  . Calcium 08/10/2014 9.1  8.9 - 10.3 mg/dL Final  . GFR calc non Af Amer 08/10/2014 >60  >60 mL/min Final  . GFR calc Af Amer 08/10/2014 >60  >60 mL/min Final   Comment: (NOTE) The eGFR has been calculated using the CKD EPI equation. This calculation has not been validated in all clinical situations. eGFR's persistently <60 mL/min signify possible Chronic Kidney Disease.   . Anion gap 08/10/2014 13  5 - 15 Final  . WBC 08/10/2014 6.9  4.0 - 10.5 K/uL Final  . RBC 08/10/2014 4.11* 4.22 - 5.81 MIL/uL Final  . Hemoglobin 08/10/2014 11.1* 13.0 - 17.0 g/dL Final  . HCT 08/10/2014 35.8* 39.0 - 52.0 % Final  . MCV 08/10/2014 87.1  78.0 - 100.0 fL Final  . MCH 08/10/2014 27.0  26.0 - 34.0 pg Final  . MCHC 08/10/2014 31.0  30.0 - 36.0 g/dL Final  . RDW 08/10/2014 16.5* 11.5 - 15.5 % Final  . Platelets 08/10/2014 234  150 - 400 K/uL Final  . Troponin i, poc 08/10/2014 0.00  0.00 - 0.08 ng/mL Final  . Comment 3 08/10/2014          Final   Comment: Due to the release kinetics of cTnI, a negative result within the first hours of the onset of symptoms does not rule out myocardial infarction with certainty. If myocardial infarction is still suspected, repeat the test at appropriate intervals.   . B Natriuretic Peptide 08/10/2014 42.0  0.0 - 100.0 pg/mL Final  . Glucose-Capillary 08/10/2014 235* 65 - 99 mg/dL Final  . Glucose-Capillary 08/10/2014 192* 65 - 99 mg/dL Final  . Glucose-Capillary 08/11/2014 146* 65 - 99 mg/dL Final  . Glucose-Capillary 08/11/2014 204* 65 - 99 mg/dL Final  . Sodium 08/12/2014 138  135 - 145 mmol/L Final  . Potassium  08/12/2014 3.9  3.5 - 5.1 mmol/L Final  . Chloride 08/12/2014 100* 101 - 111 mmol/L Final  . CO2 08/12/2014 31  22 - 32 mmol/L Final  .  Glucose, Bld 08/12/2014 185* 65 - 99 mg/dL Final  . BUN 08/12/2014 10  6 - 20 mg/dL Final  . Creatinine, Ser 08/12/2014 0.86  0.61 - 1.24 mg/dL Final  . Calcium 08/12/2014 9.0  8.9 - 10.3 mg/dL Final  . GFR calc non Af Amer 08/12/2014 >60  >60 mL/min Final  . GFR calc Af Amer 08/12/2014 >60  >60 mL/min Final   Comment: (NOTE) The eGFR has been calculated using the CKD EPI equation. This calculation has not been validated in all clinical situations. eGFR's persistently <60 mL/min signify possible Chronic Kidney Disease.   . Anion gap 08/12/2014 7  5 - 15 Final  . Glucose-Capillary 08/11/2014 225* 65 - 99 mg/dL Final  . Glucose-Capillary 08/11/2014 247* 65 - 99 mg/dL Final  . Comment 1 08/11/2014 Notify RN   Final  . Comment 2 08/11/2014 Document in Chart   Final  . Glucose-Capillary 08/12/2014 163* 65 - 99 mg/dL Final  . Comment 1 08/12/2014 Notify RN   Final  . Comment 2 08/12/2014 Document in Chart   Final  . Glucose-Capillary 08/12/2014 237* 65 - 99 mg/dL Final  . Glucose-Capillary 08/12/2014 215* 65 - 99 mg/dL Final  . Sodium 08/13/2014 135  135 - 145 mmol/L Final  . Potassium 08/13/2014 3.6  3.5 - 5.1 mmol/L Final  . Chloride 08/13/2014 92* 101 - 111 mmol/L Final  . CO2 08/13/2014 31  22 - 32 mmol/L Final  . Glucose, Bld 08/13/2014 180* 65 - 99 mg/dL Final  . BUN 08/13/2014 17  6 - 20 mg/dL Final  . Creatinine, Ser 08/13/2014 1.08  0.61 - 1.24 mg/dL Final  . Calcium 08/13/2014 9.2  8.9 - 10.3 mg/dL Final  . GFR calc non Af Amer 08/13/2014 >60  >60 mL/min Final  . GFR calc Af Amer 08/13/2014 >60  >60 mL/min Final   Comment: (NOTE) The eGFR has been calculated using the CKD EPI equation. This calculation has not been validated in all clinical situations. eGFR's persistently <60 mL/min signify possible Chronic Kidney Disease.   . Anion gap  08/13/2014 12  5 - 15 Final  . Glucose-Capillary 08/12/2014 207* 65 - 99 mg/dL Final  . Comment 1 08/12/2014 Notify RN   Final  . Comment 2 08/12/2014 Document in Chart   Final  . Glucose-Capillary 08/13/2014 190* 65 - 99 mg/dL Final  . Comment 1 08/13/2014 Notify RN   Final  . Comment 2 08/13/2014 Document in Chart   Final  . Glucose-Capillary 08/13/2014 224* 65 - 99 mg/dL Final  Office Visit on 07/24/2014  Component Date Value Ref Range Status  . Glucose 07/24/2014 139* 65 - 99 mg/dL Final  . BUN 07/24/2014 19  8 - 27 mg/dL Final  . Creatinine, Ser 07/24/2014 1.05  0.76 - 1.27 mg/dL Final  . GFR calc non Af Amer 07/24/2014 75  >59 mL/min/1.73 Final  . GFR calc Af Amer 07/24/2014 87  >59 mL/min/1.73 Final  . BUN/Creatinine Ratio 07/24/2014 18  10 - 22 Final  . Sodium 07/24/2014 140  134 - 144 mmol/L Final  . Potassium 07/24/2014 4.4  3.5 - 5.2 mmol/L Final  . Chloride 07/24/2014 96* 97 - 108 mmol/L Final  . CO2 07/24/2014 24  18 - 29 mmol/L Final  . Calcium 07/24/2014 9.9  8.6 - 10.2 mg/dL Final  . Hgb A1c MFr Bld 07/24/2014 9.3* 4.8 - 5.6 % Final   Comment:          Pre-diabetes: 5.7 - 6.4  Diabetes: >6.4          Glycemic control for adults with diabetes: <7.0   . Est. average glucose Bld gHb Est-m* 07/24/2014 220   Final  Admission on 06/14/2014, Discharged on 06/14/2014  Component Date Value Ref Range Status  . Glucose-Capillary 06/14/2014 158* 70 - 99 mg/dL Final  Office Visit on 05/16/2014  Component Date Value Ref Range Status  . Glucose 05/16/2014 192* 65 - 99 mg/dL Final  . BUN 05/16/2014 30* 8 - 27 mg/dL Final  . Creatinine, Ser 05/16/2014 1.69* 0.76 - 1.27 mg/dL Final  . GFR calc non Af Amer 05/16/2014 42* >59 mL/min/1.73 Final  . GFR calc Af Amer 05/16/2014 49* >59 mL/min/1.73 Final  . BUN/Creatinine Ratio 05/16/2014 18  10 - 22 Final  . Sodium 05/16/2014 138  134 - 144 mmol/L Final  . Potassium 05/16/2014 4.8  3.5 - 5.2 mmol/L Final  . Chloride 05/16/2014  93* 97 - 108 mmol/L Final  . CO2 05/16/2014 23  18 - 29 mmol/L Final  . Calcium 05/16/2014 9.5  8.6 - 10.2 mg/dL Final  . Total Protein 05/16/2014 7.1  6.0 - 8.5 g/dL Final  . Albumin 05/16/2014 4.0  3.6 - 4.8 g/dL Final  . Globulin, Total 05/16/2014 3.1  1.5 - 4.5 g/dL Final  . Albumin/Globulin Ratio 05/16/2014 1.3  1.1 - 2.5 Final  . Bilirubin Total 05/16/2014 0.4  0.0 - 1.2 mg/dL Final  . Alkaline Phosphatase 05/16/2014 57  39 - 117 IU/L Final  . AST 05/16/2014 43* 0 - 40 IU/L Final  . ALT 05/16/2014 30  0 - 44 IU/L Final  . Hgb A1c MFr Bld 05/16/2014 9.0* 4.8 - 5.6 % Final   Comment:          Pre-diabetes: 5.7 - 6.4          Diabetes: >6.4          Glycemic control for adults with diabetes: <7.0   . Est. average glucose Bld gHb Est-m* 05/16/2014 212   Final  . WBC 05/16/2014 8.8  3.4 - 10.8 x10E3/uL Final  . RBC 05/16/2014 4.57  4.14 - 5.80 x10E6/uL Final  . Hemoglobin 05/16/2014 12.0* 12.6 - 17.7 g/dL Final  . HCT 05/16/2014 37.3* 37.5 - 51.0 % Final  . MCV 05/16/2014 82  79 - 97 fL Final  . MCH 05/16/2014 26.3* 26.6 - 33.0 pg Final  . MCHC 05/16/2014 32.2  31.5 - 35.7 g/dL Final  . RDW 05/16/2014 18.3* 12.3 - 15.4 % Final  . Neutrophils Relative % 05/16/2014 60   Final  . Lymphs 05/16/2014 29   Final  . Monocytes 05/16/2014 7   Final  . Eos 05/16/2014 4   Final  . Basos 05/16/2014 0   Final  . Neutrophils Absolute 05/16/2014 5.2  1.4 - 7.0 x10E3/uL Final  . Lymphocytes Absolute 05/16/2014 2.6  0.7 - 3.1 x10E3/uL Final  . Monocytes Absolute 05/16/2014 0.6  0.1 - 0.9 x10E3/uL Final  . Eosinophils Absolute 05/16/2014 0.3  0.0 - 0.4 x10E3/uL Final  . Basophils Absolute 05/16/2014 0.0  0.0 - 0.2 x10E3/uL Final  . Immature Granulocytes 05/16/2014 0   Final  . Immature Grans (Abs) 05/16/2014 0.0  0.0 - 0.1 x10E3/uL Final  . Vitamin B-12 05/16/2014 681  211 - 946 pg/mL Final     Assessment/Plan 1. Pre-syncope - EKG 12-Lead   2. Cellulitis of lower extremity, unspecified  laterality -pick up Doxycycline today from pharmacy -resume and continue Doxycycline  3. Anasarca Continue Torsemide 100 mg  4. Chronic congestive heart failure, unspecified congestive heart failure type Compensated  5. DM type 2, uncontrolled, with neuropathy - Hemoglobin A1c; Future - Basic metabolic panel; Future  6. Edema -Wrap LEs as able, Home health ordered - Basic metabolic panel; Future  7. Essential hypertension Controlled  8. Chronic pain syndrome Continue Cymbalta Taper Neurontin  9. Chest pain, unspecified chest pain type - Ambulatory referral to Cardiology - EKG 12-Lead  10. Anemia, unspecified anemia type - CBC With Differential; Future  11. Hereditary and idiopathic peripheral neuropathy - gabapentin (NEURONTIN) 800 MG tablet; One up to 3 times daily to help pains. Taper off one weekly until you are off the medication.  Dispense: 100 tablet; Refill: 3 - Ambulatory referral to Neurology  12. Pain in joint, shoulder region, right - Nerve conduction test; Future

## 2014-08-16 ENCOUNTER — Other Ambulatory Visit: Payer: Self-pay

## 2014-08-16 ENCOUNTER — Telehealth: Payer: Self-pay

## 2014-08-16 DIAGNOSIS — M6281 Muscle weakness (generalized): Secondary | ICD-10-CM | POA: Diagnosis not present

## 2014-08-16 DIAGNOSIS — R260 Ataxic gait: Secondary | ICD-10-CM | POA: Diagnosis not present

## 2014-08-16 DIAGNOSIS — I10 Essential (primary) hypertension: Secondary | ICD-10-CM | POA: Diagnosis not present

## 2014-08-16 DIAGNOSIS — I5032 Chronic diastolic (congestive) heart failure: Secondary | ICD-10-CM | POA: Diagnosis not present

## 2014-08-16 DIAGNOSIS — L03116 Cellulitis of left lower limb: Secondary | ICD-10-CM | POA: Diagnosis not present

## 2014-08-16 DIAGNOSIS — L03115 Cellulitis of right lower limb: Secondary | ICD-10-CM | POA: Diagnosis not present

## 2014-08-16 MED ORDER — DOXYCYCLINE HYCLATE 100 MG PO CAPS: 100.0000 mg | ORAL_CAPSULE | Freq: Two times a day (BID) | ORAL | Status: DC

## 2014-08-16 NOTE — Telephone Encounter (Signed)
Received call from patient about a script for an antibiotic he believed Dr. Nyoka Cowden had sent to the  Pharmacy for him and it was not there . Spoke to Dr. Nyoka Cowden he was under the impression that he already had a script for antibiotic from hospital but he instructed me to send one if patient did not have one . Script was sent as instructed

## 2014-08-22 ENCOUNTER — Telehealth: Payer: Self-pay | Admitting: *Deleted

## 2014-08-22 NOTE — Telephone Encounter (Signed)
Patient called regarding a need for a psychiatrist consult and would like for Dr. Nyoka Cowden to refer someone. I spoke with Dr. Nyoka Cowden, he agreed to give him one of our preferred psychiatrist  that we work with on a daily basis. He was given [facility] to call to set up appointment.

## 2014-08-29 ENCOUNTER — Encounter: Payer: Self-pay | Admitting: Internal Medicine

## 2014-08-29 ENCOUNTER — Ambulatory Visit (INDEPENDENT_AMBULATORY_CARE_PROVIDER_SITE_OTHER): Payer: Medicare Other | Admitting: Internal Medicine

## 2014-08-29 VITALS — BP 120/60 | HR 81 | Temp 97.7°F | Resp 22 | Ht 69.0 in | Wt 386.8 lb

## 2014-08-29 DIAGNOSIS — E1140 Type 2 diabetes mellitus with diabetic neuropathy, unspecified: Secondary | ICD-10-CM | POA: Diagnosis not present

## 2014-08-29 DIAGNOSIS — R631 Polydipsia: Secondary | ICD-10-CM | POA: Diagnosis not present

## 2014-08-29 DIAGNOSIS — E1165 Type 2 diabetes mellitus with hyperglycemia: Secondary | ICD-10-CM

## 2014-08-29 DIAGNOSIS — J0100 Acute maxillary sinusitis, unspecified: Secondary | ICD-10-CM | POA: Diagnosis not present

## 2014-08-29 DIAGNOSIS — H65193 Other acute nonsuppurative otitis media, bilateral: Secondary | ICD-10-CM | POA: Diagnosis not present

## 2014-08-29 DIAGNOSIS — J309 Allergic rhinitis, unspecified: Secondary | ICD-10-CM | POA: Insufficient documentation

## 2014-08-29 DIAGNOSIS — J301 Allergic rhinitis due to pollen: Secondary | ICD-10-CM | POA: Diagnosis not present

## 2014-08-29 DIAGNOSIS — G4733 Obstructive sleep apnea (adult) (pediatric): Secondary | ICD-10-CM | POA: Diagnosis not present

## 2014-08-29 DIAGNOSIS — IMO0002 Reserved for concepts with insufficient information to code with codable children: Secondary | ICD-10-CM

## 2014-08-29 MED ORDER — AMOXICILLIN-POT CLAVULANATE 875-125 MG PO TABS: 1.0000 | ORAL_TABLET | Freq: Two times a day (BID) | ORAL | Status: DC

## 2014-08-29 NOTE — Patient Instructions (Signed)
Take probiotic (like activia or culturelle) daily while taking antibiotic  Push fluids and rest  Will call with lab results  May need to repeat sleep study to resume CPAP at bedtime  Recommend OTC plain claritin, allegra or zyrtec daily for seasonal allergy  Continue other medications as ordered  Keep appt with specialists as scheduled  Follow up with Dr Nyoka Cowden next month as scheduled

## 2014-08-29 NOTE — Progress Notes (Signed)
Patient ID: Phillip Maldonado, male   DOB: [birth year], 64 y.o.   MRN: [id number]    Location:    [facility]     Place of Service:   OFFICE  Chief Complaint  Patient presents with  . Acute Visit    feels dizzy, sluggish, disoriented, sleepy all the time, gets shaky at times    HPI:  64 yo male seen today for fatigue. He feels like he cannot keep his eyes open. He completed abx last week. He is disoriented and does not feel safe to drive or live alone. He drove to office today. He was seen by Dr Nyoka Cowden 2 weeks ago following hospital d/c. He noticed increasing lethargy 3 days ago. He has increased thirst. He drinks 2 large bottles of water with sugar free artificial sweetener. He has sleep apnea but does not use CPAP machine. Last sleep study >5 yrs ago  BS elevated at home 210s - 230s. No low BS reactions. A1c 9.3% last month.  Past Medical History  Diagnosis Date  . CHF (congestive heart failure)   . Hypertension   . Anemia   . Angina   . Shortness of breath   . Anxiety   . Blood transfusion     last 10' 14- "GI bleed"  . Headache(784.0)   . Depression   . Hepatitis B   . Neuromuscular disorder   . Arthritis   . Diabetes mellitus 09/18/2011  . Atrial fibrillation with RVR 01/24/2013  . Renal insufficiency   . DJD (degenerative joint disease)   . History of alcohol abuse     last drink in 1993  . Obesity   . Diverticulosis   . Fatty liver   . Sleep apnea, obstructive     does where bipap. 06-04-14 "doesn't use much now, no mask/tubing now.  . Pneumonia     not at present time  . Edema extremities     bilateral lower extremities-"weepy areas due to fluid retention"  . Fundic gland polyps of stomach, benign     Past Surgical History  Procedure Laterality Date  . Gastric bypass  1977    Reversed in 1992 and revision 1994  . Diagnostic laparoscopy    . Esophagoscopy N/A 01/01/2013    Procedure: ESOPHAGOSCOPY;  Surgeon: Jeryl Columbia, MD;  Location: [facility];  Service: Endoscopy;   Laterality: N/A;  . Esophagogastroduodenoscopy N/A 01/03/2013    Procedure: ESOPHAGOGASTRODUODENOSCOPY (EGD);  Surgeon: Arta Silence, MD;  Location: [facility];  Service: Endoscopy;  Laterality: N/A;  . Esophagogastroduodenoscopy N/A 01/03/2013    Procedure: ESOPHAGOGASTRODUODENOSCOPY (EGD);  Surgeon: Arta Silence, MD;  Location: [facility];  Service: Endoscopy;  Laterality: N/A;  . Esophagogastroduodenoscopy N/A 01/23/2013    Procedure: ESOPHAGOGASTRODUODENOSCOPY (EGD);  Surgeon: Lear Ng, MD;  Location: [facility];  Service: Endoscopy;  Laterality: N/A;  . Esophagogastroduodenoscopy Left 01/27/2013    Procedure: ESOPHAGOGASTRODUODENOSCOPY (EGD);  Surgeon: Lear Ng, MD;  Location: [place];  Service: Endoscopy;  Laterality: Left;  . Knee surgery Left 2004  . Hydrocele excision  1996    x 2   . Abdominal surgery      [phone]  . Colonoscopy    . Back surgery      x 8 -,multiple fusions(cervical to lumbar)  . Colonoscopy with propofol N/A 06/14/2014    Procedure: COLONOSCOPY WITH PROPOFOL;  Surgeon: Gatha Mayer, MD;  Location: [facility];  Service: Endoscopy;  Laterality: N/A;  . Esophagogastroduodenoscopy N/A 06/14/2014    Procedure:  ESOPHAGOGASTRODUODENOSCOPY (EGD);  Surgeon: Gatha Mayer, MD;  Location: [facility];  Service: Endoscopy;  Laterality: N/A;    Patient Care Team: Estill Dooms, MD as PCP - General (Internal Medicine)  History   Social History  . Marital Status: Single    Spouse Name: N/A  . Number of Children: 6  . Years of Education: N/A   Occupational History  . DISABLED    Social History Main Topics  . Smoking status: Never Smoker   . Smokeless tobacco: Not on file  . Alcohol Use: No  . Drug Use: No  . Sexual Activity: Not Currently   Other Topics Concern  . Not on file   Social History Narrative   Patient is divorced, he has a total of 6 children 2 of which are deceased. He is a retired Conservation officer, historic buildings. History  of alcoholism. No alcohol now. 2 caffeinated beverages daily. He moved to [place] in last 1-2 years.     reports that he has never smoked. He does not have any smokeless tobacco history on file. He reports that he does not drink alcohol or use illicit drugs.  Allergies  Allergen Reactions  . Adhesive [Tape] Other (See Comments)    Unknown paper tape ok to use   . Feldene [Piroxicam] Other (See Comments)    unknown  . Latex Other (See Comments)    Rash and itching    Medications: Patient's Medications  New Prescriptions   No medications on file  Previous Medications   ACETAMINOPHEN (TYLENOL) 650 MG CR TABLET    Take 1,950 mg by mouth 2 (two) times daily.   AMLODIPINE (NORVASC) 10 MG TABLET    Take 10 mg by mouth daily.   ATORVASTATIN (LIPITOR) 40 MG TABLET    take 1 tablet once daily   DOXYCYCLINE (VIBRAMYCIN) 100 MG CAPSULE    Take 1 capsule (100 mg total) by mouth 2 (two) times daily.   DULOXETINE (CYMBALTA) 60 MG CAPSULE    One twice daily to help neuropathy   GABAPENTIN (NEURONTIN) 800 MG TABLET    One up to 3 times daily to help pains. Taper off one weekly until you are off the medication.   GLUCOSE BLOOD (ACCU-CHEK AVIVA PLUS) TEST STRIP    1 each by Other route See admin instructions. Check blood sugar twice daily   INSULIN GLARGINE (LANTUS SOLOSTAR) 100 UNIT/ML SOLOSTAR PEN    INJECT 18 UNITS EVERY MORNING   INSULIN PEN NEEDLE (NOVOFINE) 30G X 8 MM MISC    Inject 1 packet into the skin See admin instructions. Use with insulin once daily.   LISINOPRIL (PRINIVIL,ZESTRIL) 20 MG TABLET    One daily to control BP and strengthen the heart   LUBIPROSTONE (AMITIZA) 24 MCG CAPSULE    Take 1 capsule (24 mcg total) by mouth 2 (two) times daily with a meal.   METFORMIN (GLUCOPHAGE) 1000 MG TABLET    Take 1,000 mg by mouth 2 (two) times daily with a meal.   METHADONE (DOLOPHINE) 10 MG TABLET    Take 1 tablet (10 mg total) by mouth 4 (four) times daily.   METOPROLOL (LOPRESSOR) 50 MG  TABLET    TAKE ONE TABLET TWICE DAILY   MULTIPLE VITAMIN (MULITIVITAMIN WITH MINERALS) TABS    Take 1 tablet by mouth daily.   NYSTATIN-TRIAMCINOLONE (MYCOLOG II) CREAM    Apply sparingly to rash twice daily   PANTOPRAZOLE (PROTONIX) 40 MG TABLET    TAKE 1 TABLET TWO TIMES DAILY.  POLYETHYLENE GLYCOL (MIRALAX / GLYCOLAX) PACKET    Take 17 g by mouth daily as needed for mild constipation or moderate constipation.   SILVER SULFADIAZINE (SILVADENE) 1 % CREAM    Apply to wounds daily.   TORSEMIDE (DEMADEX) 100 MG TABLET    One twice daily for diuretic   TRAZODONE (DESYREL) 150 MG TABLET    Take 2 tablets (300 mg total) by mouth at bedtime. 2 by mouth at bedtime  Modified Medications   No medications on file  Discontinued Medications   No medications on file    Review of Systems  Constitutional: Positive for chills, activity change, appetite change (decreased) and fatigue. Negative for fever.  HENT: Positive for ear pain and voice change (hoarsenss). Negative for sinus pressure and sore throat.        Feels flushed  Respiratory: Negative for shortness of breath.   Cardiovascular: Positive for leg swelling (chronic). Negative for chest pain.  Gastrointestinal: Positive for constipation and abdominal distention. Negative for abdominal pain.  Endocrine: Positive for polydipsia.  Genitourinary: Negative for dysuria, frequency, flank pain and difficulty urinating.  Musculoskeletal: Positive for joint swelling (especially knees) and gait problem (chronic and uses cane).  Neurological: Positive for dizziness, weakness and headaches.  Psychiatric/Behavioral: Positive for confusion.  All other systems reviewed and are negative.   Filed Vitals:   08/29/14 1354  BP: 120/60  Pulse: 81  Temp: 97.7 F (36.5 C)  TempSrc: Oral  Resp: 22  Height: '5\' 9"'  (1.753 m)  Weight: 386 lb 12.8 oz (175.451 kg)  SpO2: 97%   Body mass index is 57.09 kg/(m^2).  Physical Exam  Constitutional: He is oriented  to person, place, and time. He appears well-developed and well-nourished.  HENT:  Mouth/Throat: Oropharynx is clear and moist. No oropharyngeal exudate (but cobblestoning appearance noted with redness).  R>L TM dull, red and bulging, intact. External ear canal scaling but no d/c. Psoriatic changes. L>R maxilaary sinus TTP with boggy tissue texture changes  Eyes: Pupils are equal, round, and reactive to light. No scleral icterus.  Neck: Neck supple. Carotid bruit is not present. No thyromegaly present.  Cardiovascular: Normal rate, regular rhythm, normal heart sounds and intact distal pulses.  Exam reveals no gallop and no friction rub.   No murmur heard. +1 pitting LE edema b.l. No calf TTP  Pulmonary/Chest: Effort normal and breath sounds normal. He has no wheezes. He has no rales. He exhibits no tenderness.  Lymphadenopathy:    He has no cervical adenopathy.  Neurological: He is alert and oriented to person, place, and time.  Skin: Skin is warm and dry. Rash (psoriatic rash on ear b/l) noted.  Psychiatric: He has a normal mood and affect. His behavior is normal. Judgment and thought content normal.     Labs reviewed: Admission on 08/10/2014, Discharged on 08/13/2014  Component Date Value Ref Range Status  . Sodium 08/10/2014 137  135 - 145 mmol/L Final  . Potassium 08/10/2014 4.1  3.5 - 5.1 mmol/L Final  . Chloride 08/10/2014 98* 101 - 111 mmol/L Final  . CO2 08/10/2014 26  22 - 32 mmol/L Final  . Glucose, Bld 08/10/2014 208* 65 - 99 mg/dL Final  . BUN 08/10/2014 18  6 - 20 mg/dL Final  . Creatinine, Ser 08/10/2014 1.00  0.61 - 1.24 mg/dL Final  . Calcium 08/10/2014 9.1  8.9 - 10.3 mg/dL Final  . GFR calc non Af Amer 08/10/2014 >60  >60 mL/min Final  . GFR calc Af Wyvonnia Lora  08/10/2014 >60  >60 mL/min Final   Comment: (NOTE) The eGFR has been calculated using the CKD EPI equation. This calculation has not been validated in all clinical situations. eGFR's persistently <60 mL/min signify  possible Chronic Kidney Disease.   . Anion gap 08/10/2014 13  5 - 15 Final  . WBC 08/10/2014 6.9  4.0 - 10.5 K/uL Final  . RBC 08/10/2014 4.11* 4.22 - 5.81 MIL/uL Final  . Hemoglobin 08/10/2014 11.1* 13.0 - 17.0 g/dL Final  . HCT 08/10/2014 35.8* 39.0 - 52.0 % Final  . MCV 08/10/2014 87.1  78.0 - 100.0 fL Final  . MCH 08/10/2014 27.0  26.0 - 34.0 pg Final  . MCHC 08/10/2014 31.0  30.0 - 36.0 g/dL Final  . RDW 08/10/2014 16.5* 11.5 - 15.5 % Final  . Platelets 08/10/2014 234  150 - 400 K/uL Final  . Troponin i, poc 08/10/2014 0.00  0.00 - 0.08 ng/mL Final  . Comment 3 08/10/2014          Final   Comment: Due to the release kinetics of cTnI, a negative result within the first hours of the onset of symptoms does not rule out myocardial infarction with certainty. If myocardial infarction is still suspected, repeat the test at appropriate intervals.   . B Natriuretic Peptide 08/10/2014 42.0  0.0 - 100.0 pg/mL Final  . Glucose-Capillary 08/10/2014 235* 65 - 99 mg/dL Final  . Glucose-Capillary 08/10/2014 192* 65 - 99 mg/dL Final  . Glucose-Capillary 08/11/2014 146* 65 - 99 mg/dL Final  . Glucose-Capillary 08/11/2014 204* 65 - 99 mg/dL Final  . Sodium 08/12/2014 138  135 - 145 mmol/L Final  . Potassium 08/12/2014 3.9  3.5 - 5.1 mmol/L Final  . Chloride 08/12/2014 100* 101 - 111 mmol/L Final  . CO2 08/12/2014 31  22 - 32 mmol/L Final  . Glucose, Bld 08/12/2014 185* 65 - 99 mg/dL Final  . BUN 08/12/2014 10  6 - 20 mg/dL Final  . Creatinine, Ser 08/12/2014 0.86  0.61 - 1.24 mg/dL Final  . Calcium 08/12/2014 9.0  8.9 - 10.3 mg/dL Final  . GFR calc non Af Amer 08/12/2014 >60  >60 mL/min Final  . GFR calc Af Amer 08/12/2014 >60  >60 mL/min Final   Comment: (NOTE) The eGFR has been calculated using the CKD EPI equation. This calculation has not been validated in all clinical situations. eGFR's persistently <60 mL/min signify possible Chronic Kidney Disease.   . Anion gap 08/12/2014 7  5 -  15 Final  . Glucose-Capillary 08/11/2014 225* 65 - 99 mg/dL Final  . Glucose-Capillary 08/11/2014 247* 65 - 99 mg/dL Final  . Comment 1 08/11/2014 Notify RN   Final  . Comment 2 08/11/2014 Document in Chart   Final  . Glucose-Capillary 08/12/2014 163* 65 - 99 mg/dL Final  . Comment 1 08/12/2014 Notify RN   Final  . Comment 2 08/12/2014 Document in Chart   Final  . Glucose-Capillary 08/12/2014 237* 65 - 99 mg/dL Final  . Glucose-Capillary 08/12/2014 215* 65 - 99 mg/dL Final  . Sodium 08/13/2014 135  135 - 145 mmol/L Final  . Potassium 08/13/2014 3.6  3.5 - 5.1 mmol/L Final  . Chloride 08/13/2014 92* 101 - 111 mmol/L Final  . CO2 08/13/2014 31  22 - 32 mmol/L Final  . Glucose, Bld 08/13/2014 180* 65 - 99 mg/dL Final  . BUN 08/13/2014 17  6 - 20 mg/dL Final  . Creatinine, Ser 08/13/2014 1.08  0.61 - 1.24 mg/dL Final  .  Calcium 08/13/2014 9.2  8.9 - 10.3 mg/dL Final  . GFR calc non Af Amer 08/13/2014 >60  >60 mL/min Final  . GFR calc Af Amer 08/13/2014 >60  >60 mL/min Final   Comment: (NOTE) The eGFR has been calculated using the CKD EPI equation. This calculation has not been validated in all clinical situations. eGFR's persistently <60 mL/min signify possible Chronic Kidney Disease.   . Anion gap 08/13/2014 12  5 - 15 Final  . Glucose-Capillary 08/12/2014 207* 65 - 99 mg/dL Final  . Comment 1 08/12/2014 Notify RN   Final  . Comment 2 08/12/2014 Document in Chart   Final  . Glucose-Capillary 08/13/2014 190* 65 - 99 mg/dL Final  . Comment 1 08/13/2014 Notify RN   Final  . Comment 2 08/13/2014 Document in Chart   Final  . Glucose-Capillary 08/13/2014 224* 65 - 99 mg/dL Final  Office Visit on 07/24/2014  Component Date Value Ref Range Status  . Glucose 07/24/2014 139* 65 - 99 mg/dL Final  . BUN 07/24/2014 19  8 - 27 mg/dL Final  . Creatinine, Ser 07/24/2014 1.05  0.76 - 1.27 mg/dL Final  . GFR calc non Af Amer 07/24/2014 75  >59 mL/min/1.73 Final  . GFR calc Af Amer 07/24/2014 87   >59 mL/min/1.73 Final  . BUN/Creatinine Ratio 07/24/2014 18  10 - 22 Final  . Sodium 07/24/2014 140  134 - 144 mmol/L Final  . Potassium 07/24/2014 4.4  3.5 - 5.2 mmol/L Final  . Chloride 07/24/2014 96* 97 - 108 mmol/L Final  . CO2 07/24/2014 24  18 - 29 mmol/L Final  . Calcium 07/24/2014 9.9  8.6 - 10.2 mg/dL Final  . Hgb A1c MFr Bld 07/24/2014 9.3* 4.8 - 5.6 % Final   Comment:          Pre-diabetes: 5.7 - 6.4          Diabetes: >6.4          Glycemic control for adults with diabetes: <7.0   . Est. average glucose Bld gHb Est-m* 07/24/2014 220   Final  Admission on 06/14/2014, Discharged on 06/14/2014  Component Date Value Ref Range Status  . Glucose-Capillary 06/14/2014 158* 70 - 99 mg/dL Final    Dg Chest 2 View  08/10/2014   CLINICAL DATA:  Short of breath today.  Lower extremity swelling.  EXAM: CHEST  2 VIEW  COMPARISON:  12/07/2013  FINDINGS: Cardiac silhouette is normal in size. No mediastinal or hilar masses or evidence of adenopathy. Mild stable reticular scarring or subsegmental atelectasis at the left lateral lung base. Lungs otherwise clear. No pleural effusion or pneumothorax. Stable surgical clips are noted in the left upper quadrant.  IMPRESSION: No active cardiopulmonary disease.   Electronically Signed   By: Lajean Manes M.D.   On: 08/10/2014 20:17     Assessment/Plan   ICD-9-CM ICD-10-CM   1. Acute maxillary sinusitis, recurrence not specified 461.0 J01.00 amoxicillin-clavulanate (AUGMENTIN) 875-125 MG per tablet  2. Acute nonsuppurative otitis media of both ears 381.00 H65.193 amoxicillin-clavulanate (AUGMENTIN) 875-125 MG per tablet  3. Allergic rhinitis due to pollen 477.0 J30.1   4. Polydipsia probably due to #5 783.5 O75.6 Basic Metabolic Panel  5. DM type 2, uncontrolled, with neuropathy 250.62 E33.29 Basic Metabolic Panel   518.8 C16.60   6. OSA (obstructive sleep apnea) - uncontrolled 327.23 G47.33    -Take probiotic (like activia or culturelle) daily  while taking antibiotic  --Push fluids and rest  --Will call with lab  results  --May need to repeat sleep study to resume CPAP at bedtime  --Recommend OTC plain claritin, allegra or zyrtec daily for seasonal allergy  --Continue other medications as ordered  --Keep appt with specialists as scheduled  --Follow up with Dr Nyoka Cowden next month as scheduled   [place] S. Perlie Gold  [facility] [address] [place], [place] [address] [phone] Cell (Monday-Friday 8 AM - 5 PM) [phone] After 5 PM and follow prompts

## 2014-08-30 LAB — BASIC METABOLIC PANEL
BUN/Creatinine Ratio: 22 (ref 10–22)
BUN: 26 mg/dL (ref 8–27)
CO2: 26 mmol/L (ref 18–29)
Calcium: 9.5 mg/dL (ref 8.6–10.2)
Chloride: 91 mmol/L — ABNORMAL LOW (ref 97–108)
Creatinine, Ser: 1.16 mg/dL (ref 0.76–1.27)
GFR calc Af Amer: 77 mL/min/{1.73_m2} (ref >59)
GFR, EST NON AFRICAN AMERICAN: 67 mL/min/{1.73_m2} (ref >59)
GLUCOSE: 168 mg/dL — AB (ref 65–99)
Potassium: 4.8 mmol/L (ref 3.5–5.2)
Sodium: 137 mmol/L (ref 134–144)

## 2014-09-06 ENCOUNTER — Other Ambulatory Visit: Payer: Self-pay | Admitting: Internal Medicine

## 2014-09-06 ENCOUNTER — Telehealth: Payer: Self-pay | Admitting: *Deleted

## 2014-09-06 ENCOUNTER — Ambulatory Visit (INDEPENDENT_AMBULATORY_CARE_PROVIDER_SITE_OTHER): Payer: Self-pay | Admitting: Neurology

## 2014-09-06 ENCOUNTER — Ambulatory Visit (INDEPENDENT_AMBULATORY_CARE_PROVIDER_SITE_OTHER): Payer: Medicare Other | Admitting: Neurology

## 2014-09-06 DIAGNOSIS — Z0289 Encounter for other administrative examinations: Secondary | ICD-10-CM

## 2014-09-06 DIAGNOSIS — G5603 Carpal tunnel syndrome, bilateral upper limbs: Secondary | ICD-10-CM

## 2014-09-06 DIAGNOSIS — M25511 Pain in right shoulder: Secondary | ICD-10-CM | POA: Diagnosis not present

## 2014-09-06 DIAGNOSIS — G609 Hereditary and idiopathic neuropathy, unspecified: Secondary | ICD-10-CM

## 2014-09-06 NOTE — Progress Notes (Signed)
  [facility]    Provider:  Dr Jaynee Eagles Referring Provider: Estill Dooms, MD Primary Care Physician:  Estill Dooms, MD  CC:  Right hand pain  HPI:  Phillip Maldonado is a 63 y.o. male here as a referral from Dr. Nyoka Cowden for right hand and shoulder pain. Patient endorses pain in digits 1-4 of the right hand, they feel cold, tingling, sharp and burning. Has neck pain with radiculopathy to the fingers. Weakness of grip in the right hand, symptoms are waking him up at night. Tinel's sign positive at the right wrist.   Summary:   Left median motor nerve showed delayed distal onset latency(9.70ms, N<4.51ms), decreased amplitude (1.52mV, N>3) and delayed F Response (39 ms, N<39ms)  Left 2nd-digit Median sensory nerve showed no response   Right Median motor nerve showed no response Right 2nd-digit Median sensory nerve showed no response   Bilateral Ulnar ADM motor nerves were within normal limits with normal F response latencies.  Bilateral Ulnar 5th digit sensory nerves were within normal limits.   Bilateral Radial sensory nerves were within normal limits.   EMG needle exam showed moderately increased spontaneous activity in the right Opponens Pollicis muscle. Needle evaluation of the following muscles were within normal limits: right Deltoid, right Triceps, right Pronator teres, right First Dorsal Interosseous, right Extensor Indicis, right Flexor Carpi Radialis.. Could not perform needle study on the cervical paraspinals as these muscles are unreliable after surgery.   Conclusion: There electrophysiologic evidence for right-sided median neuropathy with absent 2nd-digit sensory potential and absent median motor conduction (recording from the APB) with acute/ongoing denervation in a distal median-innervated muscle (Opponens Pollicis). Given clinical history this is likely severe right Carpal Tunnel Syndrome. NCS also show moderately- severe left carpal tunnel syndrome. No  suggestion of upper extremity polyneuropathy or cervical radiculopathy.    Sarina Ill, MD  [facility] [address] [place], [place] [address]  Phone [phone] Fax [phone]

## 2014-09-06 NOTE — Telephone Encounter (Signed)
Left message for patient that Dr. Nyoka Cowden is willing to help

## 2014-09-06 NOTE — Telephone Encounter (Signed)
Yes. I would be willing to help with this.

## 2014-09-06 NOTE — Progress Notes (Signed)
See procedure note.

## 2014-09-06 NOTE — Procedures (Signed)
[facility]    Provider:  Dr Jaynee Eagles Referring Provider: Estill Dooms, MD Primary Care Physician:  Estill Dooms, MD  CC:  Right hand pain  HPI:  Phillip Maldonado is a 64 y.o. male here as a referral from Dr. Nyoka Cowden for right hand and shoulder pain. Patient endorses pain in digits 1-4 of the right hand, they feel cold, tingling, sharp and burning. Has neck pain with radiculopathy to the fingers. Weakness of grip in the right hand, symptoms are waking him up at night. Tinel's sign positive at the right wrist.   Summary:   Left median motor nerve showed delayed distal onset latency(9.9ms, N<4.11ms), decreased amplitude (1.68mV, N>3) and delayed F Response (39 ms, N<19ms)  Left 2nd-digit Median sensory nerve showed no response   Right Median motor nerve showed no response Right 2nd-digit Median sensory nerve showed no response   Bilateral Ulnar ADM motor nerves were within normal limits with normal F response latencies.  Bilateral Ulnar 5th digit sensory nerves were within normal limits.   Bilateral Radial sensory nerves were within normal limits.   EMG needle exam showed moderately increased spontaneous activity in the right Opponens Pollicis muscle. Needle evaluation of the following muscles were within normal limits: right Deltoid, right Triceps, right Pronator teres, right First Dorsal Interosseous, right Extensor Indicis, right Flexor carpi Radialis. Could not perform needle study on the cervical paraspinals as these muscles are unreliable after surgery.   Conclusion: There electrophysiologic evidence for right-sided median neuropathy with absent 2nd-digit sensory potential and absent median motor conduction (recording from the APB) with acute/ongoing denervation in a distal median-innervated muscle (Opponens Pollicis). Given clinical history this is likely severe right Carpal Tunnel Syndrome. NCS also show moderately- severe left carpal tunnel syndrome. No  suggestion of upper extremity polyneuropathy or cervical radiculopathy.    Sarina Ill, MD  [facility] [address] [place], [place] [address]  Phone [phone] Fax [phone]

## 2014-09-06 NOTE — Telephone Encounter (Signed)
Patient called and stated that he goes through [facility] in [place] to get his pain medications refilled. He stated that is is getting expensive having to pay $50.00 copay for the Dr. To just give him a Rx and the gas for the 80 mile round trip. Wants to know if you will take over and give him the pain medication. Please Advise.

## 2014-09-10 ENCOUNTER — Ambulatory Visit: Payer: Medicare Other | Admitting: Neurology

## 2014-09-11 ENCOUNTER — Encounter: Payer: Self-pay | Admitting: Neurology

## 2014-09-11 ENCOUNTER — Ambulatory Visit (INDEPENDENT_AMBULATORY_CARE_PROVIDER_SITE_OTHER): Payer: Medicare Other | Admitting: Internal Medicine

## 2014-09-11 ENCOUNTER — Encounter: Payer: Self-pay | Admitting: Internal Medicine

## 2014-09-11 VITALS — BP 130/82 | HR 61 | Temp 98.2°F | Resp 24 | Ht 69.0 in | Wt 390.0 lb

## 2014-09-11 DIAGNOSIS — I10 Essential (primary) hypertension: Secondary | ICD-10-CM

## 2014-09-11 DIAGNOSIS — L304 Erythema intertrigo: Secondary | ICD-10-CM

## 2014-09-11 DIAGNOSIS — R609 Edema, unspecified: Secondary | ICD-10-CM | POA: Diagnosis not present

## 2014-09-11 DIAGNOSIS — R21 Rash and other nonspecific skin eruption: Secondary | ICD-10-CM | POA: Diagnosis not present

## 2014-09-11 DIAGNOSIS — Z23 Encounter for immunization: Secondary | ICD-10-CM | POA: Diagnosis not present

## 2014-09-11 DIAGNOSIS — E1140 Type 2 diabetes mellitus with diabetic neuropathy, unspecified: Secondary | ICD-10-CM

## 2014-09-11 DIAGNOSIS — E1165 Type 2 diabetes mellitus with hyperglycemia: Secondary | ICD-10-CM | POA: Diagnosis not present

## 2014-09-11 DIAGNOSIS — IMO0002 Reserved for concepts with insufficient information to code with codable children: Secondary | ICD-10-CM

## 2014-09-11 DIAGNOSIS — G5610 Other lesions of median nerve, unspecified upper limb: Secondary | ICD-10-CM

## 2014-09-11 HISTORY — DX: Other lesions of median nerve, unspecified upper limb: G56.10

## 2014-09-11 MED ORDER — ZOSTER VACCINE LIVE 19400 UNT/0.65ML ~~LOC~~ SOLR: 0.6500 mL | Freq: Once | SUBCUTANEOUS | Status: DC

## 2014-09-11 MED ORDER — METHADONE HCL 10 MG PO TABS: ORAL_TABLET | ORAL | Status: DC

## 2014-09-11 MED ORDER — METHADONE HCL 10 MG PO TABS
ORAL_TABLET | ORAL | Status: DC
Start: 2014-09-11 — End: 2014-10-30

## 2014-09-11 NOTE — Progress Notes (Signed)
Patient ID: Phillip Maldonado, male   DOB: 1950-12-12, 64 y.o.   MRN: [id number]    Facility  [facility]    Place of Service:   OFFICE    Allergies  Allergen Reactions  . Adhesive [Tape] Other (See Comments)    Unknown paper tape ok to use   . Feldene [Piroxicam] Other (See Comments)    unknown  . Latex Other (See Comments)    Rash and itching    Chief Complaint  Patient presents with  . Medical Management of Chronic Issues    Pain in area of kidneys and BS running high    HPI:  DM type 2, uncontrolled, with neuropathy: glucose always over 200 mg%.  Edema: wearing compression stockings  Essential hypertension: controlled  Rash and nonspecific skin eruption: relapsed  Intertrigo: relapsed  Neuropathy, median nerve, unspecified laterality: he is ready to have surgery    Medications: Patient's Medications  New Prescriptions   No medications on file  Previous Medications   ACETAMINOPHEN (TYLENOL) 650 MG CR TABLET    Take 1,950 mg by mouth 2 (two) times daily.   AMLODIPINE (NORVASC) 10 MG TABLET    Take 10 mg by mouth daily.   AMOXICILLIN-CLAVULANATE (AUGMENTIN) 875-125 MG PER TABLET    Take 1 tablet by mouth 2 (two) times daily.   ATORVASTATIN (LIPITOR) 40 MG TABLET    take 1 tablet once daily   DOXYCYCLINE (VIBRA-TABS) 100 MG TABLET    Take 100 mg by mouth 2 (two) times daily.   DOXYCYCLINE (VIBRAMYCIN) 100 MG CAPSULE    Take 1 capsule (100 mg total) by mouth 2 (two) times daily.   DULOXETINE (CYMBALTA) 60 MG CAPSULE    One twice daily to help neuropathy   GABAPENTIN (NEURONTIN) 800 MG TABLET    One up to 3 times daily to help pains. Taper off one weekly until you are off the medication.   GLUCOSE BLOOD (ACCU-CHEK AVIVA PLUS) TEST STRIP    1 each by Other route See admin instructions. Check blood sugar twice daily   INSULIN GLARGINE (LANTUS SOLOSTAR) 100 UNIT/ML SOLOSTAR PEN    INJECT 18 UNITS EVERY MORNING   INSULIN PEN NEEDLE (NOVOFINE) 30G X 8 MM MISC    Inject 1  packet into the skin See admin instructions. Use with insulin once daily.   LISINOPRIL (PRINIVIL,ZESTRIL) 20 MG TABLET    One daily to control BP and strengthen the heart   LUBIPROSTONE (AMITIZA) 24 MCG CAPSULE    Take 1 capsule (24 mcg total) by mouth 2 (two) times daily with a meal.   METFORMIN (GLUCOPHAGE) 1000 MG TABLET    Take 1,000 mg by mouth 2 (two) times daily with a meal.   METHADONE (DOLOPHINE) 10 MG TABLET    Take 1 tablet (10 mg total) by mouth 4 (four) times daily.   METOPROLOL (LOPRESSOR) 50 MG TABLET    TAKE ONE TABLET TWICE DAILY   MULTIPLE VITAMIN (MULITIVITAMIN WITH MINERALS) TABS    Take 1 tablet by mouth daily.   NYSTATIN-TRIAMCINOLONE (MYCOLOG II) CREAM    Apply sparingly to rash twice daily   PANTOPRAZOLE (PROTONIX) 40 MG TABLET    TAKE 1 TABLET TWO TIMES DAILY.   POLYETHYLENE GLYCOL (MIRALAX / GLYCOLAX) PACKET    Take 17 g by mouth daily as needed for mild constipation or moderate constipation.   SILVER SULFADIAZINE (SILVADENE) 1 % CREAM    Apply to wounds daily.   TORSEMIDE (DEMADEX) 100 MG TABLET  One twice daily for diuretic   TRAZODONE (DESYREL) 150 MG TABLET    Take 2 tablets (300 mg total) by mouth at bedtime. 2 by mouth at bedtime  Modified Medications   No medications on file  Discontinued Medications   No medications on file     Review of Systems  Constitutional: Positive for activity change and fatigue. Negative for fever, appetite change and unexpected weight change.       Morbidly obese  HENT: Negative for congestion, ear pain, hearing loss, rhinorrhea, sore throat, tinnitus, trouble swallowing and voice change.   Eyes:       Corrective lenses  Respiratory: Positive for shortness of breath. Negative for cough, choking, chest tightness and wheezing.   Cardiovascular: Positive for chest pain and leg swelling. Negative for palpitations.  Gastrointestinal: Negative for nausea, abdominal pain, diarrhea, constipation and abdominal distention.  Endocrine:  Negative for cold intolerance, heat intolerance, polydipsia, polyphagia and polyuria.  Genitourinary: Negative for dysuria, urgency, frequency and testicular pain.       Not incontinent  Musculoskeletal: Positive for back pain. Negative for myalgias, arthralgias, gait problem and neck pain.  Skin: Positive for rash.       erythema of the anterior shins. History of intertrigo under the breasts and in the groin area bilaterally. There is some burning discomfort. It has relapsed. He ran out if the cream. Blotchy, red facial rash present since Spring 2015. Chronic venous stasis changes of both lower legs.  Allergic/Immunologic: Negative.   Neurological: Positive for light-headedness and numbness (both hands demonstrate findiings compatible with carpal tunnel syndrome). Negative for dizziness, tremors, syncope, speech difficulty, weakness and headaches.       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  Hematological: Negative for adenopathy. Does not bruise/bleed easily.  Psychiatric/Behavioral: Negative for hallucinations, behavioral problems, confusion, sleep disturbance and decreased concentration. The patient is not nervous/anxious.        Mild memory difficulty    Filed Vitals:   09/11/14 1516  BP: 130/82  Pulse: 61  Temp: 98.2 F (36.8 C)  TempSrc: Oral  Resp: 24  Height: '5\' 9"'  (1.753 m)  Weight: 390 lb (176.903 kg)  SpO2: 91%   Body mass index is 57.57 kg/(m^2).  Physical Exam  Constitutional: He is oriented to person, place, and time. He appears well-developed and well-nourished. No distress.  Morbid obesity  HENT:  Right Ear: External ear normal.  Left Ear: External ear normal.  Nose: Nose normal.  Mouth/Throat: Oropharynx is clear and moist. No oropharyngeal exudate.  Eyes: Conjunctivae and EOM are normal. Pupils are equal, round, and reactive to light.  Neck: No JVD present. No tracheal  deviation present. No thyromegaly present.  Cardiovascular: Normal rate, regular rhythm, normal heart sounds and intact distal pulses.  Exam reveals no gallop and no friction rub.   No murmur heard. Pulmonary/Chest: No respiratory distress. He has no wheezes. He has no rales. He exhibits no tenderness.  Abdominal: He exhibits no distension and no mass. There is no tenderness.  Anasarca with intra-abdominal fluid.  Musculoskeletal: He exhibits edema. He exhibits no tenderness.  Using cane. Unstable gait.  Lymphadenopathy:    He has no cervical adenopathy.  Neurological: He is alert and oriented to person, place, and time. He has normal reflexes. No cranial nerve deficit. Coordination normal.  08/15/14 MMSE 27/30  Skin: Rash noted. There is erythema (both shins). No pallor.  Chronic venous  stasis changes of both anterior legs. Erythematous intertrigo under the breasts and in the groin area bilaterally. Legs covered with Profore dressing.a  Psychiatric: He has a normal mood and affect. His behavior is normal. Judgment and thought content normal.     Labs reviewed: Office Visit on 08/29/2014  Component Date Value Ref Range Status  . Glucose 08/29/2014 168* 65 - 99 mg/dL Final  . BUN 08/29/2014 26  8 - 27 mg/dL Final  . Creatinine, Ser 08/29/2014 1.16  0.76 - 1.27 mg/dL Final  . GFR calc non Af Amer 08/29/2014 67  >59 mL/min/1.73 Final  . GFR calc Af Amer 08/29/2014 77  >59 mL/min/1.73 Final  . BUN/Creatinine Ratio 08/29/2014 22  10 - 22 Final  . Sodium 08/29/2014 137  134 - 144 mmol/L Final  . Potassium 08/29/2014 4.8  3.5 - 5.2 mmol/L Final  . Chloride 08/29/2014 91* 97 - 108 mmol/L Final  . CO2 08/29/2014 26  18 - 29 mmol/L Final  . Calcium 08/29/2014 9.5  8.6 - 10.2 mg/dL Final  Admission on 08/10/2014, Discharged on 08/13/2014  Component Date Value Ref Range Status  . Sodium 08/10/2014 137  135 - 145 mmol/L Final  . Potassium 08/10/2014 4.1  3.5 - 5.1 mmol/L Final  . Chloride  08/10/2014 98* 101 - 111 mmol/L Final  . CO2 08/10/2014 26  22 - 32 mmol/L Final  . Glucose, Bld 08/10/2014 208* 65 - 99 mg/dL Final  . BUN 08/10/2014 18  6 - 20 mg/dL Final  . Creatinine, Ser 08/10/2014 1.00  0.61 - 1.24 mg/dL Final  . Calcium 08/10/2014 9.1  8.9 - 10.3 mg/dL Final  . GFR calc non Af Amer 08/10/2014 >60  >60 mL/min Final  . GFR calc Af Amer 08/10/2014 >60  >60 mL/min Final   Comment: (NOTE) The eGFR has been calculated using the CKD EPI equation. This calculation has not been validated in all clinical situations. eGFR's persistently <60 mL/min signify possible Chronic Kidney Disease.   . Anion gap 08/10/2014 13  5 - 15 Final  . WBC 08/10/2014 6.9  4.0 - 10.5 K/uL Final  . RBC 08/10/2014 4.11* 4.22 - 5.81 MIL/uL Final  . Hemoglobin 08/10/2014 11.1* 13.0 - 17.0 g/dL Final  . HCT 08/10/2014 35.8* 39.0 - 52.0 % Final  . MCV 08/10/2014 87.1  78.0 - 100.0 fL Final  . MCH 08/10/2014 27.0  26.0 - 34.0 pg Final  . MCHC 08/10/2014 31.0  30.0 - 36.0 g/dL Final  . RDW 08/10/2014 16.5* 11.5 - 15.5 % Final  . Platelets 08/10/2014 234  150 - 400 K/uL Final  . Troponin i, poc 08/10/2014 0.00  0.00 - 0.08 ng/mL Final  . Comment 3 08/10/2014          Final   Comment: Due to the release kinetics of cTnI, a negative result within the first hours of the onset of symptoms does not rule out myocardial infarction with certainty. If myocardial infarction is still suspected, repeat the test at appropriate intervals.   . B Natriuretic Peptide 08/10/2014 42.0  0.0 - 100.0 pg/mL Final  . Glucose-Capillary 08/10/2014 235* 65 - 99 mg/dL Final  . Glucose-Capillary 08/10/2014 192* 65 - 99 mg/dL Final  . Glucose-Capillary 08/11/2014 146* 65 - 99 mg/dL Final  . Glucose-Capillary 08/11/2014 204* 65 - 99 mg/dL Final  . Sodium 08/12/2014 138  135 - 145 mmol/L Final  . Potassium 08/12/2014 3.9  3.5 - 5.1 mmol/L Final  . Chloride 08/12/2014 100* 101 -  111 mmol/L Final  . CO2 08/12/2014 31  22 - 32  mmol/L Final  . Glucose, Bld 08/12/2014 185* 65 - 99 mg/dL Final  . BUN 08/12/2014 10  6 - 20 mg/dL Final  . Creatinine, Ser 08/12/2014 0.86  0.61 - 1.24 mg/dL Final  . Calcium 08/12/2014 9.0  8.9 - 10.3 mg/dL Final  . GFR calc non Af Amer 08/12/2014 >60  >60 mL/min Final  . GFR calc Af Amer 08/12/2014 >60  >60 mL/min Final   Comment: (NOTE) The eGFR has been calculated using the CKD EPI equation. This calculation has not been validated in all clinical situations. eGFR's persistently <60 mL/min signify possible Chronic Kidney Disease.   . Anion gap 08/12/2014 7  5 - 15 Final  . Glucose-Capillary 08/11/2014 225* 65 - 99 mg/dL Final  . Glucose-Capillary 08/11/2014 247* 65 - 99 mg/dL Final  . Comment 1 08/11/2014 Notify RN   Final  . Comment 2 08/11/2014 Document in Chart   Final  . Glucose-Capillary 08/12/2014 163* 65 - 99 mg/dL Final  . Comment 1 08/12/2014 Notify RN   Final  . Comment 2 08/12/2014 Document in Chart   Final  . Glucose-Capillary 08/12/2014 237* 65 - 99 mg/dL Final  . Glucose-Capillary 08/12/2014 215* 65 - 99 mg/dL Final  . Sodium 08/13/2014 135  135 - 145 mmol/L Final  . Potassium 08/13/2014 3.6  3.5 - 5.1 mmol/L Final  . Chloride 08/13/2014 92* 101 - 111 mmol/L Final  . CO2 08/13/2014 31  22 - 32 mmol/L Final  . Glucose, Bld 08/13/2014 180* 65 - 99 mg/dL Final  . BUN 08/13/2014 17  6 - 20 mg/dL Final  . Creatinine, Ser 08/13/2014 1.08  0.61 - 1.24 mg/dL Final  . Calcium 08/13/2014 9.2  8.9 - 10.3 mg/dL Final  . GFR calc non Af Amer 08/13/2014 >60  >60 mL/min Final  . GFR calc Af Amer 08/13/2014 >60  >60 mL/min Final   Comment: (NOTE) The eGFR has been calculated using the CKD EPI equation. This calculation has not been validated in all clinical situations. eGFR's persistently <60 mL/min signify possible Chronic Kidney Disease.   . Anion gap 08/13/2014 12  5 - 15 Final  . Glucose-Capillary 08/12/2014 207* 65 - 99 mg/dL Final  . Comment 1 08/12/2014 Notify RN    Final  . Comment 2 08/12/2014 Document in Chart   Final  . Glucose-Capillary 08/13/2014 190* 65 - 99 mg/dL Final  . Comment 1 08/13/2014 Notify RN   Final  . Comment 2 08/13/2014 Document in Chart   Final  . Glucose-Capillary 08/13/2014 224* 65 - 99 mg/dL Final  Office Visit on 07/24/2014  Component Date Value Ref Range Status  . Glucose 07/24/2014 139* 65 - 99 mg/dL Final  . BUN 07/24/2014 19  8 - 27 mg/dL Final  . Creatinine, Ser 07/24/2014 1.05  0.76 - 1.27 mg/dL Final  . GFR calc non Af Amer 07/24/2014 75  >59 mL/min/1.73 Final  . GFR calc Af Amer 07/24/2014 87  >59 mL/min/1.73 Final  . BUN/Creatinine Ratio 07/24/2014 18  10 - 22 Final  . Sodium 07/24/2014 140  134 - 144 mmol/L Final  . Potassium 07/24/2014 4.4  3.5 - 5.2 mmol/L Final  . Chloride 07/24/2014 96* 97 - 108 mmol/L Final  . CO2 07/24/2014 24  18 - 29 mmol/L Final  . Calcium 07/24/2014 9.9  8.6 - 10.2 mg/dL Final  . Hgb A1c MFr Bld 07/24/2014 9.3* 4.8 - 5.6 %  Final   Comment:          Pre-diabetes: 5.7 - 6.4          Diabetes: >6.4          Glycemic control for adults with diabetes: <7.0   . Est. average glucose Bld gHb Est-m* 07/24/2014 220   Final  Admission on 06/14/2014, Discharged on 06/14/2014  Component Date Value Ref Range Status  . Glucose-Capillary 06/14/2014 158* 70 - 99 mg/dL Final     Assessment/Plan  1. DM type 2, uncontrolled, with neuropathy - Hemoglobin A1c; Future - Comprehensive metabolic panel; Future  2. Edema Improved. Still in compression wraps  3. Essential hypertension controlled  4. Rash and nonspecific skin eruption Resume Mycolog II prn  5. Intertrigo Resume Mycolog II prn  6. Neuropathy, median nerve, unspecified laterality - Ambulatory referral to Orthopedic Surgery - methadone (DOLOPHINE) 10 MG tablet; One tablet 4 times daily  to control pain  Dispense: 120 tablet; Refill: 0

## 2014-09-24 ENCOUNTER — Other Ambulatory Visit: Payer: Self-pay | Admitting: Internal Medicine

## 2014-09-24 ENCOUNTER — Other Ambulatory Visit: Payer: Medicare Other

## 2014-09-26 ENCOUNTER — Ambulatory Visit: Payer: Medicare Other | Admitting: Internal Medicine

## 2014-10-08 ENCOUNTER — Other Ambulatory Visit: Payer: Medicare Other

## 2014-10-09 ENCOUNTER — Other Ambulatory Visit: Payer: Medicare Other

## 2014-10-10 ENCOUNTER — Ambulatory Visit: Payer: Self-pay | Admitting: Internal Medicine

## 2014-10-11 ENCOUNTER — Other Ambulatory Visit: Payer: Medicare Other

## 2014-10-11 DIAGNOSIS — IMO0002 Reserved for concepts with insufficient information to code with codable children: Secondary | ICD-10-CM

## 2014-10-11 DIAGNOSIS — D649 Anemia, unspecified: Secondary | ICD-10-CM

## 2014-10-11 DIAGNOSIS — E1140 Type 2 diabetes mellitus with diabetic neuropathy, unspecified: Secondary | ICD-10-CM

## 2014-10-11 DIAGNOSIS — E1165 Type 2 diabetes mellitus with hyperglycemia: Secondary | ICD-10-CM

## 2014-10-12 ENCOUNTER — Other Ambulatory Visit: Payer: Self-pay | Admitting: Internal Medicine

## 2014-10-12 LAB — CBC WITH DIFFERENTIAL
BASOS ABS: 0.1 10*3/uL (ref 0.0–0.2)
Basos: 1 %
EOS (ABSOLUTE): 0.5 10*3/uL — ABNORMAL HIGH (ref 0.0–0.4)
Eos: 6 %
HEMATOCRIT: 37.5 % (ref 37.5–51.0)
HEMOGLOBIN: 11.6 g/dL — AB (ref 12.6–17.7)
IMMATURE GRANS (ABS): 0 10*3/uL (ref 0.0–0.1)
Immature Granulocytes: 0 %
LYMPHS: 22 %
Lymphocytes Absolute: 1.8 10*3/uL (ref 0.7–3.1)
MCH: 27.5 pg (ref 26.6–33.0)
MCHC: 30.9 g/dL — ABNORMAL LOW (ref 31.5–35.7)
MCV: 89 fL (ref 79–97)
MONOCYTES: 7 %
Monocytes Absolute: 0.6 10*3/uL (ref 0.1–0.9)
Neutrophils Absolute: 5.3 10*3/uL (ref 1.4–7.0)
Neutrophils: 64 %
RBC: 4.22 x10E6/uL (ref 4.14–5.80)
RDW: 16.6 % — AB (ref 12.3–15.4)
WBC: 8.3 10*3/uL (ref 3.4–10.8)

## 2014-10-12 LAB — COMPREHENSIVE METABOLIC PANEL
ALK PHOS: 69 IU/L (ref 39–117)
ALT: 22 IU/L (ref 0–44)
AST: 26 IU/L (ref 0–40)
Albumin/Globulin Ratio: 1.3 (ref 1.1–2.5)
Albumin: 3.8 g/dL (ref 3.6–4.8)
BUN / CREAT RATIO: 17 (ref 10–22)
BUN: 18 mg/dL (ref 8–27)
Bilirubin Total: 0.4 mg/dL (ref 0.0–1.2)
CALCIUM: 9.8 mg/dL (ref 8.6–10.2)
CO2: 28 mmol/L (ref 18–29)
CREATININE: 1.05 mg/dL (ref 0.76–1.27)
Chloride: 95 mmol/L — ABNORMAL LOW (ref 97–108)
GFR calc Af Amer: 87 mL/min/{1.73_m2} (ref >59)
GFR calc non Af Amer: 75 mL/min/{1.73_m2} (ref >59)
Globulin, Total: 2.9 g/dL (ref 1.5–4.5)
Glucose: 210 mg/dL — ABNORMAL HIGH (ref 65–99)
POTASSIUM: 3.8 mmol/L (ref 3.5–5.2)
Sodium: 142 mmol/L (ref 134–144)
TOTAL PROTEIN: 6.7 g/dL (ref 6.0–8.5)

## 2014-10-12 LAB — HEMOGLOBIN A1C
ESTIMATED AVERAGE GLUCOSE: 197 mg/dL
HEMOGLOBIN A1C: 8.5 % — AB (ref 4.8–5.6)

## 2014-10-17 ENCOUNTER — Encounter: Payer: Self-pay | Admitting: Internal Medicine

## 2014-10-17 ENCOUNTER — Ambulatory Visit (INDEPENDENT_AMBULATORY_CARE_PROVIDER_SITE_OTHER): Payer: Medicare Other | Admitting: Internal Medicine

## 2014-10-17 VITALS — BP 146/82 | HR 76 | Temp 98.1°F | Ht 69.0 in | Wt 385.6 lb

## 2014-10-17 DIAGNOSIS — E7800 Pure hypercholesterolemia, unspecified: Secondary | ICD-10-CM

## 2014-10-17 DIAGNOSIS — E1165 Type 2 diabetes mellitus with hyperglycemia: Secondary | ICD-10-CM

## 2014-10-17 DIAGNOSIS — G894 Chronic pain syndrome: Secondary | ICD-10-CM

## 2014-10-17 DIAGNOSIS — I10 Essential (primary) hypertension: Secondary | ICD-10-CM

## 2014-10-17 DIAGNOSIS — E1140 Type 2 diabetes mellitus with diabetic neuropathy, unspecified: Secondary | ICD-10-CM

## 2014-10-17 DIAGNOSIS — D649 Anemia, unspecified: Secondary | ICD-10-CM | POA: Diagnosis not present

## 2014-10-17 DIAGNOSIS — L03119 Cellulitis of unspecified part of limb: Secondary | ICD-10-CM

## 2014-10-17 DIAGNOSIS — M25511 Pain in right shoulder: Secondary | ICD-10-CM

## 2014-10-17 DIAGNOSIS — L304 Erythema intertrigo: Secondary | ICD-10-CM | POA: Diagnosis not present

## 2014-10-17 DIAGNOSIS — IMO0002 Reserved for concepts with insufficient information to code with codable children: Secondary | ICD-10-CM

## 2014-10-17 DIAGNOSIS — R609 Edema, unspecified: Secondary | ICD-10-CM | POA: Diagnosis not present

## 2014-10-17 MED ORDER — INSULIN GLARGINE 100 UNIT/ML SOLOSTAR PEN: PEN_INJECTOR | SUBCUTANEOUS | Status: DC

## 2014-10-17 NOTE — Progress Notes (Signed)
Patient ID: Phillip Maldonado, male   DOB: 1950-10-18, 64 y.o.   MRN: [id number]    Facility  [place]    Place of Service:   OFFICE    Allergies  Allergen Reactions  . Adhesive [Tape] Other (See Comments)    Unknown paper tape ok to use   . Feldene [Piroxicam] Other (See Comments)    unknown  . Latex Other (See Comments)    Rash and itching    Chief Complaint  Patient presents with  . Medical Management of Chronic Issues    Medical Management of Chronic Issues. 1 Month Follow up    HPI:  DM type 2, uncontrolled, with neuropathy: Although A1c dropped from 9.5 down to 8.7, I do not think he is under good control. He has not had any hypoglycemic spells. He is willing to adjust his insulin himself.  Edema: Slightly improved. Has quit wrapping the legs because of significant improvement in his leg rash. No weeping ulcerations or other soreness in the legs now. There are some chronic skin changes related to his stasis dermatitis.  Essential hypertension: Controlled  Anemia, unspecified anemia type: Hemoglobin was 11.6. MCV is normal.  Chronic pain syndrome: Claims he was on at 480 mg methadone daily at one time. He has tapered himself down to 10 mg 4 times daily. He asked whether there are other medications that can help get him off of this. Pain medications were taken chiefly for the pain in his arms and wrists. He has documented severe carpal tunnel syndrome in both wrists.  Cellulitis of lower extremity, unspecified laterality: Resolved  Pain in joint, shoulder region, right: Second source of discomfort and pain. Has seen orthopedist in the past.  Intertrigo: Improved    Medications: Patient's Medications  New Prescriptions   No medications on file  Previous Medications   ACETAMINOPHEN (TYLENOL) 650 MG CR TABLET    Take 1,950 mg by mouth 2 (two) times daily.   AMLODIPINE (NORVASC) 10 MG TABLET    Take 10 mg by mouth daily.   AMOXICILLIN-CLAVULANATE (AUGMENTIN) 875-125 MG  PER TABLET    Take 1 tablet by mouth 2 (two) times daily.   ATORVASTATIN (LIPITOR) 40 MG TABLET    take 1 tablet once daily   DULOXETINE (CYMBALTA) 60 MG CAPSULE    One twice daily to help neuropathy   GABAPENTIN (NEURONTIN) 800 MG TABLET    One up to 3 times daily to help pains. Taper off one weekly until you are off the medication.   GLUCOSE BLOOD (ACCU-CHEK AVIVA PLUS) TEST STRIP    1 each by Other route See admin instructions. Check blood sugar twice daily   INSULIN GLARGINE (LANTUS SOLOSTAR) 100 UNIT/ML SOLOSTAR PEN    INJECT 18 UNITS EVERY MORNING   INSULIN PEN NEEDLE (NOVOFINE) 30G X 8 MM MISC    Inject 1 packet into the skin See admin instructions. Use with insulin once daily.   LISINOPRIL (PRINIVIL,ZESTRIL) 20 MG TABLET    One daily to control BP and strengthen the heart   LUBIPROSTONE (AMITIZA) 24 MCG CAPSULE    Take 1 capsule (24 mcg total) by mouth 2 (two) times daily with a meal.   METFORMIN (GLUCOPHAGE) 1000 MG TABLET    Take 1,000 mg by mouth 2 (two) times daily with a meal.   METHADONE (DOLOPHINE) 10 MG TABLET    One tablet 4 times daily  to control pain   METOPROLOL (LOPRESSOR) 50 MG TABLET    TAKE ONE  TABLET TWICE DAILY   MULTIPLE VITAMIN (MULITIVITAMIN WITH MINERALS) TABS    Take 1 tablet by mouth daily.   PANTOPRAZOLE (PROTONIX) 40 MG TABLET    TAKE 1 TABLET TWO TIMES DAILY.   POLYETHYLENE GLYCOL (MIRALAX / GLYCOLAX) PACKET    Take 17 g by mouth daily as needed for mild constipation or moderate constipation.   TORSEMIDE (DEMADEX) 100 MG TABLET    TAKE ONE TABLET TWICE DAILY   TRAZODONE (DESYREL) 150 MG TABLET    Take 2 tablets (300 mg total) by mouth at bedtime. 2 by mouth at bedtime   ZOSTER VACCINE LIVE, PF, (ZOSTAVAX) 58850 UNT/0.65ML INJECTION    Inject 19,400 Units into the skin once.  Modified Medications   No medications on file  Discontinued Medications   AMITIZA 24 MCG CAPSULE    Take 1 capsule (24 mcg total) by mouth 2 (two) times daily with a meal.   DOXYCYCLINE  (VIBRA-TABS) 100 MG TABLET    Take 100 mg by mouth 2 (two) times daily.   DOXYCYCLINE (VIBRAMYCIN) 100 MG CAPSULE    Take 1 capsule (100 mg total) by mouth 2 (two) times daily.   NYSTATIN-TRIAMCINOLONE (MYCOLOG II) CREAM    Apply sparingly to rash twice daily   SILVER SULFADIAZINE (SILVADENE) 1 % CREAM    Apply to wounds daily.     Review of Systems  Constitutional: Positive for activity change and fatigue. Negative for fever, appetite change and unexpected weight change.       Morbidly obese  HENT: Negative for congestion, ear pain, hearing loss, rhinorrhea, sore throat, tinnitus, trouble swallowing and voice change.   Eyes:       Corrective lenses  Respiratory: Positive for shortness of breath. Negative for cough, choking, chest tightness and wheezing.   Cardiovascular: Positive for chest pain and leg swelling. Negative for palpitations.  Gastrointestinal: Negative for nausea, abdominal pain, diarrhea, constipation and abdominal distention.  Endocrine: Negative for cold intolerance, heat intolerance, polydipsia, polyphagia and polyuria.  Genitourinary: Negative for dysuria, urgency, frequency and testicular pain.       Not incontinent  Musculoskeletal: Positive for back pain. Negative for myalgias, arthralgias, gait problem and neck pain.  Skin: Positive for rash.       erythema of the anterior shins. History of intertrigo under the breasts and in the groin area bilaterally.  Blotchy, red facial rash present since Spring 2015. Chronic venous stasis changes of both lower legs.  Allergic/Immunologic: Negative.   Neurological: Positive for light-headedness and numbness (both hands demonstrate findiings compatible with carpal tunnel syndrome). Negative for dizziness, tremors, syncope, speech difficulty, weakness and headaches.       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  Hematological: Negative for  adenopathy. Does not bruise/bleed easily.  Psychiatric/Behavioral: Negative for hallucinations, behavioral problems, confusion, sleep disturbance and decreased concentration. The patient is not nervous/anxious.        Mild memory difficulty    Filed Vitals:   10/17/14 1349  BP: 146/82  Pulse: 76  Temp: 98.1 F (36.7 C)  TempSrc: Oral  Height: '5\' 9"'  (1.753 m)  Weight: 385 lb 9.6 oz (174.907 kg)   Body mass index is 56.92 kg/(m^2).  Physical Exam  Constitutional: He is oriented to person, place, and time. He appears well-developed and well-nourished. No distress.  Morbid obesity  HENT:  Right Ear: External ear normal.  Left Ear: External ear normal.  Nose: Nose  normal.  Mouth/Throat: Oropharynx is clear and moist. No oropharyngeal exudate.  Eyes: Conjunctivae and EOM are normal. Pupils are equal, round, and reactive to light.  Neck: No JVD present. No tracheal deviation present. No thyromegaly present.  Cardiovascular: Normal rate, regular rhythm, normal heart sounds and intact distal pulses.  Exam reveals no gallop and no friction rub.   No murmur heard. Pulmonary/Chest: No respiratory distress. He has no wheezes. He has no rales. He exhibits no tenderness.  Abdominal: He exhibits no distension and no mass. There is no tenderness.  Musculoskeletal: He exhibits edema. He exhibits no tenderness.  Using cane. Unstable gait.  Lymphadenopathy:    He has no cervical adenopathy.  Neurological: He is alert and oriented to person, place, and time. He has normal reflexes. No cranial nerve deficit. Coordination normal.  08/15/14 MMSE 27/30 09/06/2014  PNCV showed severe bilateral median nerve neuropathy consistent with severe carpal tunnel syndrome.  Skin: There is erythema (both shins). No pallor.  Chronic venous stasis changes of both anterior legs.  Psychiatric: He has a normal mood and affect. His behavior is normal. Judgment and thought content normal.     Labs  reviewed: Appointment on 10/11/2014  Component Date Value Ref Range Status  . WBC 10/11/2014 8.3  3.4 - 10.8 x10E3/uL Final  . RBC 10/11/2014 4.22  4.14 - 5.80 x10E6/uL Final  . Hemoglobin 10/11/2014 11.6* 12.6 - 17.7 g/dL Final  . Hematocrit 10/11/2014 37.5  37.5 - 51.0 % Final  . MCV 10/11/2014 89  79 - 97 fL Final  . MCH 10/11/2014 27.5  26.6 - 33.0 pg Final  . MCHC 10/11/2014 30.9* 31.5 - 35.7 g/dL Final  . RDW 10/11/2014 16.6* 12.3 - 15.4 % Final  . Neutrophils 10/11/2014 64   Final  . Lymphs 10/11/2014 22   Final  . Monocytes 10/11/2014 7   Final  . Eos 10/11/2014 6   Final  . Basos 10/11/2014 1   Final  . Neutrophils Absolute 10/11/2014 5.3  1.4 - 7.0 x10E3/uL Final  . Lymphocytes Absolute 10/11/2014 1.8  0.7 - 3.1 x10E3/uL Final  . Monocytes Absolute 10/11/2014 0.6  0.1 - 0.9 x10E3/uL Final  . EOS (ABSOLUTE) 10/11/2014 0.5* 0.0 - 0.4 x10E3/uL Final  . Basophils Absolute 10/11/2014 0.1  0.0 - 0.2 x10E3/uL Final  . Immature Granulocytes 10/11/2014 0   Final  . Immature Grans (Abs) 10/11/2014 0.0  0.0 - 0.1 x10E3/uL Final  . Hgb A1c MFr Bld 10/11/2014 8.5* 4.8 - 5.6 % Final   Comment:          Pre-diabetes: 5.7 - 6.4          Diabetes: >6.4          Glycemic control for adults with diabetes: <7.0   . Est. average glucose Bld gHb Est-m* 10/11/2014 197   Final  . Glucose 10/11/2014 210* 65 - 99 mg/dL Final  . BUN 10/11/2014 18  8 - 27 mg/dL Final  . Creatinine, Ser 10/11/2014 1.05  0.76 - 1.27 mg/dL Final  . GFR calc non Af Amer 10/11/2014 75  >59 mL/min/1.73 Final  . GFR calc Af Amer 10/11/2014 87  >59 mL/min/1.73 Final  . BUN/Creatinine Ratio 10/11/2014 17  10 - 22 Final  . Sodium 10/11/2014 142  134 - 144 mmol/L Final  . Potassium 10/11/2014 3.8  3.5 - 5.2 mmol/L Final  . Chloride 10/11/2014 95* 97 - 108 mmol/L Final  . CO2 10/11/2014 28  18 - 29 mmol/L Final  .  Calcium 10/11/2014 9.8  8.6 - 10.2 mg/dL Final  . Total Protein 10/11/2014 6.7  6.0 - 8.5 g/dL Final  .  Albumin 10/11/2014 3.8  3.6 - 4.8 g/dL Final  . Globulin, Total 10/11/2014 2.9  1.5 - 4.5 g/dL Final  . Albumin/Globulin Ratio 10/11/2014 1.3  1.1 - 2.5 Final  . Bilirubin Total 10/11/2014 0.4  0.0 - 1.2 mg/dL Final  . Alkaline Phosphatase 10/11/2014 69  39 - 117 IU/L Final  . AST 10/11/2014 26  0 - 40 IU/L Final  . ALT 10/11/2014 22  0 - 44 IU/L Final  Office Visit on 08/29/2014  Component Date Value Ref Range Status  . Glucose 08/29/2014 168* 65 - 99 mg/dL Final  . BUN 08/29/2014 26  8 - 27 mg/dL Final  . Creatinine, Ser 08/29/2014 1.16  0.76 - 1.27 mg/dL Final  . GFR calc non Af Amer 08/29/2014 67  >59 mL/min/1.73 Final  . GFR calc Af Amer 08/29/2014 77  >59 mL/min/1.73 Final  . BUN/Creatinine Ratio 08/29/2014 22  10 - 22 Final  . Sodium 08/29/2014 137  134 - 144 mmol/L Final  . Potassium 08/29/2014 4.8  3.5 - 5.2 mmol/L Final  . Chloride 08/29/2014 91* 97 - 108 mmol/L Final  . CO2 08/29/2014 26  18 - 29 mmol/L Final  . Calcium 08/29/2014 9.5  8.6 - 10.2 mg/dL Final  Admission on 08/10/2014, Discharged on 08/13/2014  Component Date Value Ref Range Status  . Sodium 08/10/2014 137  135 - 145 mmol/L Final  . Potassium 08/10/2014 4.1  3.5 - 5.1 mmol/L Final  . Chloride 08/10/2014 98* 101 - 111 mmol/L Final  . CO2 08/10/2014 26  22 - 32 mmol/L Final  . Glucose, Bld 08/10/2014 208* 65 - 99 mg/dL Final  . BUN 08/10/2014 18  6 - 20 mg/dL Final  . Creatinine, Ser 08/10/2014 1.00  0.61 - 1.24 mg/dL Final  . Calcium 08/10/2014 9.1  8.9 - 10.3 mg/dL Final  . GFR calc non Af Amer 08/10/2014 >60  >60 mL/min Final  . GFR calc Af Amer 08/10/2014 >60  >60 mL/min Final   Comment: (NOTE) The eGFR has been calculated using the CKD EPI equation. This calculation has not been validated in all clinical situations. eGFR's persistently <60 mL/min signify possible Chronic Kidney Disease.   . Anion gap 08/10/2014 13  5 - 15 Final  . WBC 08/10/2014 6.9  4.0 - 10.5 K/uL Final  . RBC 08/10/2014 4.11*  4.22 - 5.81 MIL/uL Final  . Hemoglobin 08/10/2014 11.1* 13.0 - 17.0 g/dL Final  . HCT 08/10/2014 35.8* 39.0 - 52.0 % Final  . MCV 08/10/2014 87.1  78.0 - 100.0 fL Final  . MCH 08/10/2014 27.0  26.0 - 34.0 pg Final  . MCHC 08/10/2014 31.0  30.0 - 36.0 g/dL Final  . RDW 08/10/2014 16.5* 11.5 - 15.5 % Final  . Platelets 08/10/2014 234  150 - 400 K/uL Final  . Troponin i, poc 08/10/2014 0.00  0.00 - 0.08 ng/mL Final  . Comment 3 08/10/2014          Final   Comment: Due to the release kinetics of cTnI, a negative result within the first hours of the onset of symptoms does not rule out myocardial infarction with certainty. If myocardial infarction is still suspected, repeat the test at appropriate intervals.   . B Natriuretic Peptide 08/10/2014 42.0  0.0 - 100.0 pg/mL Final  . Glucose-Capillary 08/10/2014 235* 65 - 99 mg/dL Final  .  Glucose-Capillary 08/10/2014 192* 65 - 99 mg/dL Final  . Glucose-Capillary 08/11/2014 146* 65 - 99 mg/dL Final  . Glucose-Capillary 08/11/2014 204* 65 - 99 mg/dL Final  . Sodium 08/12/2014 138  135 - 145 mmol/L Final  . Potassium 08/12/2014 3.9  3.5 - 5.1 mmol/L Final  . Chloride 08/12/2014 100* 101 - 111 mmol/L Final  . CO2 08/12/2014 31  22 - 32 mmol/L Final  . Glucose, Bld 08/12/2014 185* 65 - 99 mg/dL Final  . BUN 08/12/2014 10  6 - 20 mg/dL Final  . Creatinine, Ser 08/12/2014 0.86  0.61 - 1.24 mg/dL Final  . Calcium 08/12/2014 9.0  8.9 - 10.3 mg/dL Final  . GFR calc non Af Amer 08/12/2014 >60  >60 mL/min Final  . GFR calc Af Amer 08/12/2014 >60  >60 mL/min Final   Comment: (NOTE) The eGFR has been calculated using the CKD EPI equation. This calculation has not been validated in all clinical situations. eGFR's persistently <60 mL/min signify possible Chronic Kidney Disease.   . Anion gap 08/12/2014 7  5 - 15 Final  . Glucose-Capillary 08/11/2014 225* 65 - 99 mg/dL Final  . Glucose-Capillary 08/11/2014 247* 65 - 99 mg/dL Final  . Comment 1 08/11/2014  Notify RN   Final  . Comment 2 08/11/2014 Document in Chart   Final  . Glucose-Capillary 08/12/2014 163* 65 - 99 mg/dL Final  . Comment 1 08/12/2014 Notify RN   Final  . Comment 2 08/12/2014 Document in Chart   Final  . Glucose-Capillary 08/12/2014 237* 65 - 99 mg/dL Final  . Glucose-Capillary 08/12/2014 215* 65 - 99 mg/dL Final  . Sodium 08/13/2014 135  135 - 145 mmol/L Final  . Potassium 08/13/2014 3.6  3.5 - 5.1 mmol/L Final  . Chloride 08/13/2014 92* 101 - 111 mmol/L Final  . CO2 08/13/2014 31  22 - 32 mmol/L Final  . Glucose, Bld 08/13/2014 180* 65 - 99 mg/dL Final  . BUN 08/13/2014 17  6 - 20 mg/dL Final  . Creatinine, Ser 08/13/2014 1.08  0.61 - 1.24 mg/dL Final  . Calcium 08/13/2014 9.2  8.9 - 10.3 mg/dL Final  . GFR calc non Af Amer 08/13/2014 >60  >60 mL/min Final  . GFR calc Af Amer 08/13/2014 >60  >60 mL/min Final   Comment: (NOTE) The eGFR has been calculated using the CKD EPI equation. This calculation has not been validated in all clinical situations. eGFR's persistently <60 mL/min signify possible Chronic Kidney Disease.   . Anion gap 08/13/2014 12  5 - 15 Final  . Glucose-Capillary 08/12/2014 207* 65 - 99 mg/dL Final  . Comment 1 08/12/2014 Notify RN   Final  . Comment 2 08/12/2014 Document in Chart   Final  . Glucose-Capillary 08/13/2014 190* 65 - 99 mg/dL Final  . Comment 1 08/13/2014 Notify RN   Final  . Comment 2 08/13/2014 Document in Chart   Final  . Glucose-Capillary 08/13/2014 224* 65 - 99 mg/dL Final  Office Visit on 07/24/2014  Component Date Value Ref Range Status  . Glucose 07/24/2014 139* 65 - 99 mg/dL Final  . BUN 07/24/2014 19  8 - 27 mg/dL Final  . Creatinine, Ser 07/24/2014 1.05  0.76 - 1.27 mg/dL Final  . GFR calc non Af Amer 07/24/2014 75  >59 mL/min/1.73 Final  . GFR calc Af Amer 07/24/2014 87  >59 mL/min/1.73 Final  . BUN/Creatinine Ratio 07/24/2014 18  10 - 22 Final  . Sodium 07/24/2014 140  134 - 144 mmol/L  Final  . Potassium 07/24/2014  4.4  3.5 - 5.2 mmol/L Final  . Chloride 07/24/2014 96* 97 - 108 mmol/L Final  . CO2 07/24/2014 24  18 - 29 mmol/L Final  . Calcium 07/24/2014 9.9  8.6 - 10.2 mg/dL Final  . Hgb A1c MFr Bld 07/24/2014 9.3* 4.8 - 5.6 % Final   Comment:          Pre-diabetes: 5.7 - 6.4          Diabetes: >6.4          Glycemic control for adults with diabetes: <7.0   . Est. average glucose Bld gHb Est-m* 07/24/2014 220   Final     Assessment/Plan 1. DM type 2, uncontrolled, with neuropathy Increase Lantus insulin 22 units nightly. I further advised patient to increase by 2 units nightly every fourth day until capillary glucose values are less than 150 mg percent. - Hemoglobin A1c; Future - Basic metabolic panel; Future - Insulin Glargine (LANTUS SOLOSTAR) 100 UNIT/ML Solostar Pen; INJECT 22 UNITS EVERY MORNING  Dispense: 3 mL; Refill: 3  2. Edema Tolerating high-dose torsemide at 100 mg daily. Continue the same dose.  3. Essential hypertension Modest elevation in systolic blood pressure. Patient is losing weight. We will continue to monitor closely, but there is no change in medication today.  4. Anemia, unspecified anemia type Hemoglobin 11.6. Continue follow-up with lab. - CBC With Differential; Future  5. Chronic pain syndrome Patient has an appointment in regards to his carpal tunnel syndrome 11/15/14. I recommended that he stay with his current pain medications until further evaluated and treated for his severe carpal tunnel syndrome. It is possible that surgical correction of this problem could lead to improvement in his chronic pains. In this case reduction of medication is appropriate.  6. Cellulitis of lower extremity, unspecified laterality Resolved  7. Pain in joint, shoulder region, right Continue current pain medication  8. Intertrigo Resolved  9. Hypercholesterolemia - Lipid panel; Future

## 2014-10-19 ENCOUNTER — Telehealth: Payer: Self-pay | Admitting: *Deleted

## 2014-10-19 ENCOUNTER — Other Ambulatory Visit: Payer: Self-pay | Admitting: Internal Medicine

## 2014-10-19 DIAGNOSIS — M25512 Pain in left shoulder: Secondary | ICD-10-CM

## 2014-10-19 NOTE — Telephone Encounter (Signed)
Patient notified and agreed. Order placed.

## 2014-10-19 NOTE — Telephone Encounter (Signed)
Patient called and stated that he compromised his Left shoulder and heard a crack/pop. Still in pain and hurts. Wants to know if you can send him to have an x-ray done. I offered patient an appointment for today but patient stated you knew what was going on and was just seen. Please Advise.

## 2014-10-19 NOTE — Telephone Encounter (Signed)
Send for an x-ray of the left shoulder to rule out fracture.

## 2014-10-22 ENCOUNTER — Ambulatory Visit
Admission: RE | Admit: 2014-10-22 | Discharge: 2014-10-22 | Disposition: A | Discharge status: Home / Self Care | Payer: Medicare Other | Source: Ambulatory Visit | Attending: Internal Medicine | Admitting: Internal Medicine

## 2014-10-22 DIAGNOSIS — M25512 Pain in left shoulder: Secondary | ICD-10-CM

## 2014-10-26 ENCOUNTER — Other Ambulatory Visit: Payer: Self-pay

## 2014-10-26 ENCOUNTER — Telehealth: Payer: Self-pay

## 2014-10-26 ENCOUNTER — Other Ambulatory Visit: Payer: Self-pay | Admitting: Internal Medicine

## 2014-10-26 DIAGNOSIS — G5610 Other lesions of median nerve, unspecified upper limb: Secondary | ICD-10-CM

## 2014-10-26 DIAGNOSIS — IMO0002 Reserved for concepts with insufficient information to code with codable children: Secondary | ICD-10-CM

## 2014-10-26 DIAGNOSIS — E1165 Type 2 diabetes mellitus with hyperglycemia: Principal | ICD-10-CM

## 2014-10-26 DIAGNOSIS — E1140 Type 2 diabetes mellitus with diabetic neuropathy, unspecified: Secondary | ICD-10-CM

## 2014-10-26 MED ORDER — INSULIN GLARGINE 100 UNIT/ML SOLOSTAR PEN: PEN_INJECTOR | SUBCUTANEOUS | Status: DC

## 2014-10-26 NOTE — Telephone Encounter (Signed)
Spoke with pharmacy the Rx is 16 day supply, so he use 2 refills in one month. Told pharmacy patient is using 22 -24 units, so they will dispense 3 pins with 3 refills. Called patient

## 2014-10-26 NOTE — Telephone Encounter (Signed)
Message on triage voice, called patient back wants refill on his Methadone. I explain to the patient it is marked to be refill from pain clinic only. He had talked with Dr. Nyoka Cowden about this, it's hard for him to go all the way to [facility] for this and Dr. Nyoka Cowden said he would do it. Dr. Nyoka Cowden did write Rx on 09/11/14 for #120 one four times daily. Told him I would have to talk with Dr. Eulas Post about this, she is in the office and Dr. Nyoka Cowden on vacation. Spoke with Dr. Eulas Post, the patient will have to wait for Dr. Nyoka Cowden to fill the Methadone, since the note about refill from pain clinic. Also needs refill on Lantus pin. That was fill on 10/17/14 with 3 refills. He is out, Dr. Nyoka Cowden said he could bump his insulin up if he needed to and he has been taking 24units and not 22 units. The pharmacy said there were no refills. I would call the pharmacy about the Lantus, and forward this note to Dr. Nyoka Cowden about the Methadone. He was ok with that.

## 2014-10-30 MED ORDER — METHADONE HCL 10 MG PO TABS: ORAL_TABLET | ORAL | Status: DC

## 2014-10-30 NOTE — Telephone Encounter (Signed)
Patient called again today and asked if we are going to fill his Methadone and stated that he was out. Is it ok to fill or should patient be going through his pain clinic? Please Advise.

## 2014-10-30 NOTE — Telephone Encounter (Signed)
Refill methadone. i advised him that I am willing to take over this prescription.

## 2014-10-30 NOTE — Telephone Encounter (Signed)
Rx printed and patient notified and will pick up

## 2014-11-14 ENCOUNTER — Encounter: Payer: Self-pay | Admitting: Internal Medicine

## 2014-11-14 ENCOUNTER — Ambulatory Visit (INDEPENDENT_AMBULATORY_CARE_PROVIDER_SITE_OTHER): Payer: Medicare Other | Admitting: Internal Medicine

## 2014-11-14 VITALS — BP 130/88 | HR 86 | Temp 98.1°F | Resp 18 | Ht 69.0 in | Wt 389.0 lb

## 2014-11-14 DIAGNOSIS — R609 Edema, unspecified: Secondary | ICD-10-CM | POA: Diagnosis not present

## 2014-11-14 DIAGNOSIS — E1165 Type 2 diabetes mellitus with hyperglycemia: Secondary | ICD-10-CM

## 2014-11-14 DIAGNOSIS — E1140 Type 2 diabetes mellitus with diabetic neuropathy, unspecified: Secondary | ICD-10-CM

## 2014-11-14 DIAGNOSIS — L03116 Cellulitis of left lower limb: Secondary | ICD-10-CM | POA: Diagnosis not present

## 2014-11-14 DIAGNOSIS — IMO0002 Reserved for concepts with insufficient information to code with codable children: Secondary | ICD-10-CM

## 2014-11-14 MED ORDER — AMOXICILLIN-POT CLAVULANATE 875-125 MG PO TABS
1.0000 | ORAL_TABLET | Freq: Two times a day (BID) | ORAL | Status: DC
Start: 2014-11-14 — End: 2014-12-26

## 2014-11-14 NOTE — Progress Notes (Signed)
Patient ID: Phillip Maldonado, male   DOB: [birth year], 64 y.o.   MRN: [id number]    Location:    [facility]   Place of Service:  OFFICE   Chief Complaint  Patient presents with  . Acute Visit    His legs are swelling started last friday has blister forming on his left leg    HPI:  64 yo male seen today for leg swelling. He has noticed blisters forming on distal left anterior leg and is c/a infection. He takes diuretics as Rx. No known trauma. He sits in recliner most of the day. Swelling waxes and wanes. He was sitting for several hrs last week at the [facility].   DM - BS 160-170s. No low BS reactions. Last A1c 8.5% a month ago. He takes insulin and metformin. He has chronic burning sensation in b/l leg due to neuropathy  Hx CHF - stable on diuretics. He takes a BB.  Past Medical History  Diagnosis Date  . CHF (congestive heart failure)   . Hypertension   . Anemia   . Angina   . Shortness of breath   . Anxiety   . Blood transfusion     last 10' 14- "GI bleed"  . Headache(784.0)   . Depression   . Hepatitis B   . Neuromuscular disorder   . Arthritis   . Diabetes mellitus 09/18/2011  . Atrial fibrillation with RVR 01/24/2013  . Renal insufficiency   . DJD (degenerative joint disease)   . History of alcohol abuse     last drink in 1993  . Obesity   . Diverticulosis   . Fatty liver   . Sleep apnea, obstructive     does where bipap. 06-04-14 "doesn't use much now, no mask/tubing now.  . Pneumonia     not at present time  . Edema extremities     bilateral lower extremities-"weepy areas due to fluid retention"  . Fundic gland polyps of stomach, benign   . DM type 2, uncontrolled, 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  . Neuropathy, median nerve 09/11/2014    Bilateral    Past Surgical History  Procedure Laterality Date  . Gastric bypass  1977    Reversed in 1992 and revision 1994  . Diagnostic laparoscopy    . Esophagoscopy N/A 01/01/2013    Procedure: ESOPHAGOSCOPY;  Surgeon: Jeryl Columbia, MD;  Location: [facility];  Service: Endoscopy;  Laterality: N/A;  . Esophagogastroduodenoscopy N/A 01/03/2013    Procedure: ESOPHAGOGASTRODUODENOSCOPY (EGD);  Surgeon: Arta Silence, MD;  Location: [facility];  Service: Endoscopy;  Laterality: N/A;  . Esophagogastroduodenoscopy N/A 01/03/2013    Procedure: ESOPHAGOGASTRODUODENOSCOPY (EGD);  Surgeon: Arta Silence, MD;  Location: [facility];  Service: Endoscopy;  Laterality: N/A;  . Esophagogastroduodenoscopy N/A 01/23/2013    Procedure: ESOPHAGOGASTRODUODENOSCOPY (EGD);  Surgeon: Lear Ng, MD;  Location: [facility];  Service: Endoscopy;  Laterality: N/A;  . Esophagogastroduodenoscopy Left 01/27/2013    Procedure: ESOPHAGOGASTRODUODENOSCOPY (EGD);  Surgeon: Lear Ng, MD;  Location: [facility];  Service: Endoscopy;  Laterality: Left;  . Knee surgery Left 2004  . Hydrocele excision  1996    x 2   . Abdominal surgery      [phone]  . Colonoscopy    . Back surgery      x 8 -,multiple fusions(cervical to lumbar)  . Colonoscopy with propofol N/A 06/14/2014    Procedure: COLONOSCOPY WITH PROPOFOL;  Surgeon: Gatha Mayer, MD;  Location: [facility];  Service: Endoscopy;  Laterality: N/A;  .  Esophagogastroduodenoscopy N/A 06/14/2014    Procedure: ESOPHAGOGASTRODUODENOSCOPY (EGD);  Surgeon: Gatha Mayer, MD;  Location: [facility];  Service: Endoscopy;  Laterality: N/A;    Patient Care Team: Estill Dooms, MD as PCP - General (Internal Medicine)  Social History   Social History  . Marital Status: Single    Spouse Name: N/A  . Number of Children: 6  . Years of Education: N/A   Occupational History  . DISABLED    Social History Main Topics  . Smoking status: Never Smoker   . Smokeless tobacco: Not on file  . Alcohol Use: No  . Drug Use: No  . Sexual Activity: Not Currently   Other Topics Concern  . Not on file   Social History Narrative   Patient is divorced, he has a total of 6 children 2 of which  are deceased. He is a retired Conservation officer, historic buildings. History of alcoholism. No alcohol now. 2 caffeinated beverages daily. He moved to [place] in last 1-2 years.     reports that he has never smoked. He does not have any smokeless tobacco history on file. He reports that he does not drink alcohol or use illicit drugs.  Allergies  Allergen Reactions  . Adhesive [Tape] Other (See Comments)    Unknown paper tape ok to use   . Feldene [Piroxicam] Other (See Comments)    unknown  . Latex Other (See Comments)    Rash and itching    Medications: Patient's Medications  New Prescriptions   No medications on file  Previous Medications   ACETAMINOPHEN (TYLENOL) 650 MG CR TABLET    Take 1,950 mg by mouth 2 (two) times daily.   AMLODIPINE (NORVASC) 10 MG TABLET    Take 10 mg by mouth daily.   ATORVASTATIN (LIPITOR) 40 MG TABLET    take 1 tablet once daily   DULOXETINE (CYMBALTA) 60 MG CAPSULE    One twice daily to help neuropathy   GABAPENTIN (NEURONTIN) 800 MG TABLET    One up to 3 times daily to help pains. Taper off one weekly until you are off the medication.   GLUCOSE BLOOD (ACCU-CHEK AVIVA PLUS) TEST STRIP    1 each by Other route See admin instructions. Check blood sugar twice daily   INSULIN GLARGINE (LANTUS SOLOSTAR) 100 UNIT/ML SOLOSTAR PEN    INJECT 22 - 24 UNITS EVERY MORNING   INSULIN PEN NEEDLE (NOVOFINE) 30G X 8 MM MISC    Inject 1 packet into the skin See admin instructions. Use with insulin once daily.   LANTUS SOLOSTAR 100 UNIT/ML SOLOSTAR PEN    INJECT 18 UNITS EVERY MORNING   LISINOPRIL (PRINIVIL,ZESTRIL) 20 MG TABLET    One daily to control BP and strengthen the heart   LUBIPROSTONE (AMITIZA) 24 MCG CAPSULE    Take 1 capsule (24 mcg total) by mouth 2 (two) times daily with a meal.   METFORMIN (GLUCOPHAGE) 1000 MG TABLET    Take 1,000 mg by mouth 2 (two) times daily with a meal.   METHADONE (DOLOPHINE) 10 MG TABLET    Take One tablet by mouth 4 times daily  to control pain    METOPROLOL (LOPRESSOR) 50 MG TABLET    TAKE ONE TABLET TWICE DAILY   MULTIPLE VITAMIN (MULITIVITAMIN WITH MINERALS) TABS    Take 1 tablet by mouth daily.   PANTOPRAZOLE (PROTONIX) 40 MG TABLET    TAKE 1 TABLET TWO TIMES DAILY.   POLYETHYLENE GLYCOL (MIRALAX / GLYCOLAX) PACKET  Take 17 g by mouth daily as needed for mild constipation or moderate constipation.   TORSEMIDE (DEMADEX) 100 MG TABLET    TAKE ONE TABLET TWICE DAILY   TRAZODONE (DESYREL) 150 MG TABLET    Take 2 tablets (300 mg total) by mouth at bedtime. 2 by mouth at bedtime  Modified Medications   No medications on file  Discontinued Medications   ZOSTER VACCINE LIVE, PF, (ZOSTAVAX) 09628 UNT/0.65ML INJECTION    Inject 19,400 Units into the skin once.    Review of Systems  Constitutional: Positive for fatigue. Negative for fever and chills.  Respiratory: Negative for shortness of breath and wheezing.   Cardiovascular: Positive for leg swelling. Negative for chest pain.  Musculoskeletal: Positive for arthralgias and gait problem.  Skin: Positive for rash and wound.    Filed Vitals:   11/14/14 0932  BP: 130/88  Pulse: 86  Temp: 98.1 F (36.7 C)  TempSrc: Oral  Resp: 18  Height: 5\' 9"  (1.753 m)  Weight: 389 lb (176.449 kg)  SpO2: 93%   Body mass index is 57.42 kg/(m^2).  Physical Exam  Constitutional: He is oriented to person, place, and time. He appears well-developed and well-nourished. No distress.  Cardiovascular: Intact distal pulses and normal pulses.   +1 pitting LE edema b/l leg with chronic venous stasis changes. No calf TTP  Musculoskeletal: He exhibits edema and tenderness.  Uses cane to ambulate  Neurological: He is alert and oriented to person, place, and time.  Skin: Skin is warm. Rash noted. There is erythema.     Left leg distal with vesicular formation and medial eschcar with pustular center, peripheral increased warmth to touch and TTP. Increased redness. Chronic venous stasis changes b/l in leg.   Psychiatric: He has a normal mood and affect. His behavior is normal. Thought content normal.     Labs reviewed: Appointment on 10/11/2014  Component Date Value Ref Range Status  . WBC 10/11/2014 8.3  3.4 - 10.8 x10E3/uL Final  . RBC 10/11/2014 4.22  4.14 - 5.80 x10E6/uL Final  . Hemoglobin 10/11/2014 11.6* 12.6 - 17.7 g/dL Final  . Hematocrit 10/11/2014 37.5  37.5 - 51.0 % Final  . MCV 10/11/2014 89  79 - 97 fL Final  . MCH 10/11/2014 27.5  26.6 - 33.0 pg Final  . MCHC 10/11/2014 30.9* 31.5 - 35.7 g/dL Final  . RDW 10/11/2014 16.6* 12.3 - 15.4 % Final  . Neutrophils 10/11/2014 64   Final  . Lymphs 10/11/2014 22   Final  . Monocytes 10/11/2014 7   Final  . Eos 10/11/2014 6   Final  . Basos 10/11/2014 1   Final  . Neutrophils Absolute 10/11/2014 5.3  1.4 - 7.0 x10E3/uL Final  . Lymphocytes Absolute 10/11/2014 1.8  0.7 - 3.1 x10E3/uL Final  . Monocytes Absolute 10/11/2014 0.6  0.1 - 0.9 x10E3/uL Final  . EOS (ABSOLUTE) 10/11/2014 0.5* 0.0 - 0.4 x10E3/uL Final  . Basophils Absolute 10/11/2014 0.1  0.0 - 0.2 x10E3/uL Final  . Immature Granulocytes 10/11/2014 0   Final  . Immature Grans (Abs) 10/11/2014 0.0  0.0 - 0.1 x10E3/uL Final  . Hgb A1c MFr Bld 10/11/2014 8.5* 4.8 - 5.6 % Final   Comment:          Pre-diabetes: 5.7 - 6.4          Diabetes: >6.4          Glycemic control for adults with diabetes: <7.0   . Est. average glucose Bld gHb  Est-m* 10/11/2014 197   Final  . Glucose 10/11/2014 210* 65 - 99 mg/dL Final  . BUN 10/11/2014 18  8 - 27 mg/dL Final  . Creatinine, Ser 10/11/2014 1.05  0.76 - 1.27 mg/dL Final  . GFR calc non Af Amer 10/11/2014 75  >59 mL/min/1.73 Final  . GFR calc Af Amer 10/11/2014 87  >59 mL/min/1.73 Final  . BUN/Creatinine Ratio 10/11/2014 17  10 - 22 Final  . Sodium 10/11/2014 142  134 - 144 mmol/L Final  . Potassium 10/11/2014 3.8  3.5 - 5.2 mmol/L Final  . Chloride 10/11/2014 95* 97 - 108 mmol/L Final  . CO2 10/11/2014 28  18 - 29 mmol/L Final  .  Calcium 10/11/2014 9.8  8.6 - 10.2 mg/dL Final  . Total Protein 10/11/2014 6.7  6.0 - 8.5 g/dL Final  . Albumin 10/11/2014 3.8  3.6 - 4.8 g/dL Final  . Globulin, Total 10/11/2014 2.9  1.5 - 4.5 g/dL Final  . Albumin/Globulin Ratio 10/11/2014 1.3  1.1 - 2.5 Final  . Bilirubin Total 10/11/2014 0.4  0.0 - 1.2 mg/dL Final  . Alkaline Phosphatase 10/11/2014 69  39 - 117 IU/L Final  . AST 10/11/2014 26  0 - 40 IU/L Final  . ALT 10/11/2014 22  0 - 44 IU/L Final  Office Visit on 08/29/2014  Component Date Value Ref Range Status  . Glucose 08/29/2014 168* 65 - 99 mg/dL Final  . BUN 08/29/2014 26  8 - 27 mg/dL Final  . Creatinine, Ser 08/29/2014 1.16  0.76 - 1.27 mg/dL Final  . GFR calc non Af Amer 08/29/2014 67  >59 mL/min/1.73 Final  . GFR calc Af Amer 08/29/2014 77  >59 mL/min/1.73 Final  . BUN/Creatinine Ratio 08/29/2014 22  10 - 22 Final  . Sodium 08/29/2014 137  134 - 144 mmol/L Final  . Potassium 08/29/2014 4.8  3.5 - 5.2 mmol/L Final  . Chloride 08/29/2014 91* 97 - 108 mmol/L Final  . CO2 08/29/2014 26  18 - 29 mmol/L Final  . Calcium 08/29/2014 9.5  8.6 - 10.2 mg/dL Final    Dg Shoulder Left  10/22/2014   CLINICAL DATA:  Severe pain and decreased range of motion on the left for the past week without history of trauma; onset of symptoms after extending the arm  EXAM: LEFT SHOULDER - 2+ VIEW  COMPARISON:  Limited views of the left shoulder if from chest x-rays of December 07, 2013 and Aug 10, 2014  FINDINGS: The bones are adequately mineralized. The glenohumeral joint is unremarkable. There is some irregularity of the greater tuberosity of the humerus. The subacromial subdeltoid space preserved. There is mild narrowing of the AC joint. The observed portions of the left clavicle and upper left ribs are normal.  IMPRESSION: There are mild degenerative changes centered on the [facility] joint and in the region of the insertion of the rotator cuff. There is no acute bony abnormality. If internal  derangement is suspected, MRI would be a useful next imaging step.   Electronically Signed   By: David  Martinique M.D.   On: 10/22/2014 13:17     Assessment/Plan   ICD-9-CM ICD-10-CM   1. Cellulitis of leg, left 682.6 L03.116 amoxicillin-clavulanate (AUGMENTIN) 875-125 MG per tablet  2. DM type 2, uncontrolled, with neuropathy 250.62 E11.40    357.2 E11.65   3.      Edema - stable   Keep legs elevated while seated  Take all of antibiotic to clear infection  Call office in  3 days if no improvement in left leg. May need to change to bactrim ds  if no better  Follow up with Dr Nyoka Cowden as scheduled  [facility]. Perlie Gold  [facility] [address] [place], [place] [address] [phone] Cell (Monday-Friday 8 AM - 5 PM) [phone] After 5 PM and follow prompts

## 2014-11-14 NOTE — Patient Instructions (Signed)
Keep legs elevated while seated  Take all of antibiotic to clear infection  Call office in 3 days if no improvement in left leg  Follow up with Dr Nyoka Cowden as scheduled

## 2014-11-20 ENCOUNTER — Other Ambulatory Visit: Payer: Self-pay | Admitting: Internal Medicine

## 2014-11-21 ENCOUNTER — Other Ambulatory Visit: Payer: Self-pay | Admitting: Internal Medicine

## 2014-11-22 ENCOUNTER — Other Ambulatory Visit: Payer: Self-pay | Admitting: *Deleted

## 2014-11-22 MED ORDER — METFORMIN HCL 1000 MG PO TABS: 1000.0000 mg | ORAL_TABLET | Freq: Two times a day (BID) | ORAL | Status: DC

## 2014-11-22 NOTE — Telephone Encounter (Signed)
[facility] 

## 2014-11-23 ENCOUNTER — Other Ambulatory Visit: Payer: Self-pay

## 2014-11-23 MED ORDER — TORSEMIDE 100 MG PO TABS: 100.0000 mg | ORAL_TABLET | Freq: Two times a day (BID) | ORAL | Status: DC

## 2014-11-29 ENCOUNTER — Other Ambulatory Visit: Payer: Self-pay | Admitting: *Deleted

## 2014-11-29 DIAGNOSIS — G5610 Other lesions of median nerve, unspecified upper limb: Secondary | ICD-10-CM

## 2014-11-29 MED ORDER — METHADONE HCL 10 MG PO TABS: ORAL_TABLET | ORAL | Status: DC

## 2014-11-29 NOTE — Telephone Encounter (Signed)
Patient requested and will pick up 

## 2014-12-10 ENCOUNTER — Encounter: Payer: Self-pay | Admitting: Internal Medicine

## 2014-12-18 ENCOUNTER — Other Ambulatory Visit: Payer: Self-pay | Admitting: Internal Medicine

## 2014-12-19 ENCOUNTER — Ambulatory Visit: Payer: Medicare Other | Admitting: Internal Medicine

## 2014-12-21 ENCOUNTER — Telehealth: Payer: Self-pay | Admitting: Internal Medicine

## 2014-12-21 NOTE — Telephone Encounter (Signed)
If he wants to test, he should make time to go. Please assist at least once more in scheduling.

## 2014-12-21 NOTE — Telephone Encounter (Signed)
FYI - Patient was a no show for his Lower Extremity Doppler 07/30/14 we have called the patient several times since his missed appointment to get appointment rescheduled. We also sent a letter to the patient in September. Patient states he still wants to have test done but has just been busy

## 2014-12-26 ENCOUNTER — Encounter: Payer: Self-pay | Admitting: Internal Medicine

## 2014-12-26 ENCOUNTER — Ambulatory Visit (INDEPENDENT_AMBULATORY_CARE_PROVIDER_SITE_OTHER): Payer: Medicare Other | Admitting: Internal Medicine

## 2014-12-26 VITALS — BP 150/68 | HR 125 | Temp 98.1°F | Resp 24 | Ht 69.0 in | Wt 390.8 lb

## 2014-12-26 DIAGNOSIS — E7800 Pure hypercholesterolemia, unspecified: Secondary | ICD-10-CM

## 2014-12-26 DIAGNOSIS — IMO0002 Reserved for concepts with insufficient information to code with codable children: Secondary | ICD-10-CM

## 2014-12-26 DIAGNOSIS — E1165 Type 2 diabetes mellitus with hyperglycemia: Secondary | ICD-10-CM

## 2014-12-26 DIAGNOSIS — E1140 Type 2 diabetes mellitus with diabetic neuropathy, unspecified: Secondary | ICD-10-CM | POA: Diagnosis not present

## 2014-12-26 DIAGNOSIS — R21 Rash and other nonspecific skin eruption: Secondary | ICD-10-CM

## 2014-12-26 DIAGNOSIS — G5610 Other lesions of median nerve, unspecified upper limb: Secondary | ICD-10-CM

## 2014-12-26 DIAGNOSIS — R609 Edema, unspecified: Secondary | ICD-10-CM

## 2014-12-26 DIAGNOSIS — G894 Chronic pain syndrome: Secondary | ICD-10-CM | POA: Diagnosis not present

## 2014-12-26 DIAGNOSIS — Z23 Encounter for immunization: Secondary | ICD-10-CM

## 2014-12-26 DIAGNOSIS — D649 Anemia, unspecified: Secondary | ICD-10-CM | POA: Diagnosis not present

## 2014-12-26 DIAGNOSIS — G609 Hereditary and idiopathic neuropathy, unspecified: Secondary | ICD-10-CM | POA: Diagnosis not present

## 2014-12-26 DIAGNOSIS — I10 Essential (primary) hypertension: Secondary | ICD-10-CM | POA: Diagnosis not present

## 2014-12-26 DIAGNOSIS — M170 Bilateral primary osteoarthritis of knee: Secondary | ICD-10-CM | POA: Diagnosis not present

## 2014-12-26 MED ORDER — NYSTATIN-TRIAMCINOLONE 100000-0.1 UNIT/GM-% EX CREA: 1.0000 "application " | TOPICAL_CREAM | Freq: Two times a day (BID) | CUTANEOUS | Status: DC

## 2014-12-26 MED ORDER — PREGABALIN 50 MG PO CAPS: ORAL_CAPSULE | ORAL | Status: DC

## 2014-12-26 NOTE — Patient Instructions (Addendum)
Use Zyrtec (cetirizine) 10 mg daily for allergy.  Genteal eye drops may help soothe eye discomfort.  Do not use Gabapentin when using Lyrica.

## 2014-12-26 NOTE — Progress Notes (Signed)
Patient ID: Phillip Maldonado, male   DOB: [birth year], 64 y.o.   MRN: [id number]    Facility  [facility]    Place of Service:   OFFICE    Allergies  Allergen Reactions  . Adhesive [Tape] Other (See Comments)    Unknown paper tape ok to use   . Feldene [Piroxicam] Other (See Comments)    unknown  . Latex Other (See Comments)    Rash and itching    Chief Complaint  Patient presents with  . Medical Management of Chronic Issues    right knee pain,     HPI:  Right knee pain. Left knee also hurts, but not as bad.  Wants brace for the knees. Knees pop and crack when he gets up from a chair and occasionally when he walks.  Felt like grit in his eyes starting last night. Eyes and forehead feel swollen.  DM type 2, uncontrolled, with neuropathy ([place]) - controlled  Edema, unspecified type - 2-3+ in both legs. Some improvement noted.  Essential hypertension - controlled  Neuropathy, median nerve, unspecified laterality - patient had surgery scheduled with Dr. Sudie Bailey, but it was canceled when he could not come up with money for the co-pay-$354. Surgery is to be rescheduled.   Anemia, unspecified anemia type - needs follow-up at future visit  Hypercholesterolemia - needs future follow-up  Chronic pain syndrome -  benefiting from methadone-   Rash and nonspecific skin eruption would like a renewal of the Mycolog-II cream  Idiopathic peripheral neuropathy - patient would like to try Lyrica. He does not feel gabapentin is helping very much.     Medications: Patient's Medications  New Prescriptions   No medications on file  Previous Medications   ACETAMINOPHEN (TYLENOL) 650 MG CR TABLET    Take 1,950 mg by mouth 2 (two) times daily.   AMLODIPINE (NORVASC) 10 MG TABLET    Take 10 mg by mouth daily.   ATORVASTATIN (LIPITOR) 40 MG TABLET    take 1 tablet once daily   DULOXETINE (CYMBALTA) 60 MG CAPSULE    One twice daily to help neuropathy   GABAPENTIN (NEURONTIN) 800 MG TABLET    One  up to 3 times daily to help pains. Taper off one weekly until you are off the medication.   GLUCOSE BLOOD (ACCU-CHEK AVIVA PLUS) TEST STRIP    1 each by Other route See admin instructions. Check blood sugar twice daily   INSULIN GLARGINE (LANTUS SOLOSTAR) 100 UNIT/ML SOLOSTAR PEN    INJECT 22 - 24 UNITS EVERY MORNING   INSULIN PEN NEEDLE (NOVOFINE) 30G X 8 MM MISC    Inject 1 packet into the skin See admin instructions. Use with insulin once daily.   LISINOPRIL (PRINIVIL,ZESTRIL) 20 MG TABLET    TAKE ONE TABLET DAILY to control blood pressure and strengthen the heart   LUBIPROSTONE (AMITIZA) 24 MCG CAPSULE    Take 1 capsule (24 mcg total) by mouth 2 (two) times daily with a meal.   METFORMIN (GLUCOPHAGE) 1000 MG TABLET    Take 1 tablet (1,000 mg total) by mouth 2 (two) times daily with a meal.   METHADONE (DOLOPHINE) 10 MG TABLET    Take One tablet by mouth 4 times daily  to control pain   METOPROLOL (LOPRESSOR) 50 MG TABLET    TAKE ONE TABLET TWICE DAILY   MULTIPLE VITAMIN (MULITIVITAMIN WITH MINERALS) TABS    Take 1 tablet by mouth daily.   NYSTATIN-TRIAMCINOLONE (MYCOLOG II) CREAM  Apply 1 application topically 2 (two) times daily.   PANTOPRAZOLE (PROTONIX) 40 MG TABLET    TAKE 1 TABLET TWO TIMES DAILY.   POLYETHYLENE GLYCOL (MIRALAX / GLYCOLAX) PACKET    Take 17 g by mouth daily as needed for mild constipation or moderate constipation.   TORSEMIDE (DEMADEX) 100 MG TABLET    Take 1 tablet (100 mg total) by mouth 2 (two) times daily.   TRAZODONE (DESYREL) 150 MG TABLET    Take 2 tablets (300 mg total) by mouth at bedtime. 2 by mouth at bedtime  Modified Medications   No medications on file  Discontinued Medications   AMOXICILLIN-CLAVULANATE (AUGMENTIN) 875-125 MG PER TABLET    Take 1 tablet by mouth 2 (two) times daily.   LANTUS SOLOSTAR 100 UNIT/ML SOLOSTAR PEN    INJECT 18 UNITS EVERY MORNING    Review of Systems  Constitutional: Positive for activity change and fatigue. Negative for  fever, appetite change and unexpected weight change.       Morbidly obese  HENT: Negative for congestion, ear pain, hearing loss, rhinorrhea, sore throat, tinnitus, trouble swallowing and voice change.   Eyes:       Corrective lenses  Respiratory: Positive for shortness of breath. Negative for cough, choking, chest tightness and wheezing.   Cardiovascular: Positive for chest pain and leg swelling. Negative for palpitations.  Gastrointestinal: Negative for nausea, abdominal pain, diarrhea, constipation and abdominal distention.  Endocrine: Negative for cold intolerance, heat intolerance, polydipsia, polyphagia and polyuria.  Genitourinary: Negative for dysuria, urgency, frequency and testicular pain.       Not incontinent  Musculoskeletal: Positive for back pain. Negative for myalgias, arthralgias, gait problem and neck pain.  Skin: Positive for rash.       erythema of the anterior shins. History of intertrigo under the breasts and in the groin area bilaterally.  Blotchy, red facial rash present since Spring 2015. Chronic venous stasis changes of both lower legs.  Allergic/Immunologic: Negative.   Neurological: Positive for light-headedness and numbness (both hands demonstrate findiings compatible with carpal tunnel syndrome). Negative for dizziness, tremors, syncope, speech difficulty, weakness and headaches.       Wears glove on the right hand due to chronic painful median nerve neuropathy.History of painful neuropathy of the feet and lower legs.  Hematological: Negative for adenopathy. Does not bruise/bleed easily.  Psychiatric/Behavioral: Negative for hallucinations, behavioral problems, confusion, sleep disturbance and decreased concentration. The patient is not nervous/anxious.        Mild memory difficulty    Filed Vitals:   12/26/14 1312  BP: 150/68  Pulse: 125  Temp: 98.1 F (36.7 C)  TempSrc: Oral  Resp: 24  Height: 5' 9" (1.753 m)  Weight: 390 lb 12.8 oz (177.266 kg)    SpO2: 95%   Body mass index is 57.68 kg/(m^2).  Physical Exam  Constitutional: He is oriented to person, place, and time. He appears well-developed and well-nourished. No distress.  Morbid obesity  HENT:  Right Ear: External ear normal.  Left Ear: External ear normal.  Nose: Nose normal.  Mouth/Throat: Oropharynx is clear and moist. No oropharyngeal exudate.  Eyes: Conjunctivae and EOM are normal. Pupils are equal, round, and reactive to light.  Neck: No JVD present. No tracheal deviation present. No thyromegaly present.  Cardiovascular: Normal rate, regular rhythm, normal heart sounds and intact distal pulses.  Exam reveals no gallop and no friction rub.   No murmur heard. Pulmonary/Chest: No respiratory distress. He has no wheezes. He has no  rales. He exhibits no tenderness.  Abdominal: He exhibits no distension and no mass. There is no tenderness.  Musculoskeletal: He exhibits edema. He exhibits no tenderness.  Using cane. Unstable gait.  Lymphadenopathy:    He has no cervical adenopathy.  Neurological: He is alert and oriented to person, place, and time. He has normal reflexes. No cranial nerve deficit. Coordination normal.  08/15/14 MMSE 27/30 09/06/2014  PNCV showed severe bilateral median nerve neuropathy consistent with severe carpal tunnel syndrome.  Skin: There is erythema (both shins). No pallor.  Chronic venous stasis changes of both anterior legs.  Psychiatric: He has a normal mood and affect. His behavior is normal. Judgment and thought content normal.    Labs reviewed: Lab Summary Latest Ref Rng 10/11/2014 08/29/2014 08/13/2014 08/12/2014 08/10/2014  Hemoglobin 12.6 - 17.7 g/dL 11.6(L) (None) (None) (None) 11.1(L)  Hematocrit 37.5 - 51.0 % 37.5 (None) (None) (None) 35.8(L)  White count 3.4 - 10.8 x10E3/uL 8.3 (None) (None) (None) 6.9  Platelet count 150 - 400 K/uL (None) (None) (None) (None) 234  Sodium 134 - 144 mmol/L 142 137 135 138 137  Potassium 3.5 - 5.2 mmol/L  3.8 4.8 3.6 3.9 4.1  Calcium 8.6 - 10.2 mg/dL 9.8 9.5 9.2 9.0 9.1  Phosphorus - (None) (None) (None) (None) (None)  Creatinine 0.76 - 1.27 mg/dL 1.05 1.16 1.08 0.86 1.00  AST 0 - 40 IU/L 26 (None) (None) (None) (None)  Alk Phos 39 - 117 IU/L 69 (None) (None) (None) (None)  Bilirubin 0.0 - 1.2 mg/dL 0.4 (None) (None) (None) (None)  Glucose 65 - 99 mg/dL 210(H) 168(H) 180(H) 185(H) 208(H)  Cholesterol - (None) (None) (None) (None) (None)  HDL cholesterol - (None) (None) (None) (None) (None)  Triglycerides - (None) (None) (None) (None) (None)  LDL Direct - (None) (None) (None) (None) (None)  LDL Calc - (None) (None) (None) (None) (None)  Total protein - (None) (None) (None) (None) (None)  Albumin 3.6 - 4.8 g/dL 3.8 (None) (None) (None) (None)   Lab Results  Component Value Date   TSH 2.530 03/14/2014   Lab Results  Component Value Date   BUN 18 10/11/2014   Lab Results  Component Value Date   HGBA1C 8.5* 10/11/2014    Assessment/Plan  1. Primary osteoarthritis of both knees Prescription written for hinged braces for each knee  2. DM type 2, uncontrolled, with neuropathy ([place]) Controlled  3. Edema, unspecified type Somewhat improved  4. Essential hypertension Controlled  5. Neuropathy, median nerve, unspecified laterality Reschedule surgery for carpal tunnel bilaterally  6. Anemia, unspecified anemia type -CBC, future  7. Hypercholesterolemia -Lipid panel, future  8. Chronic pain syndrome Continue methadone  9. Rash and nonspecific skin eruption - Ambulatory referral to Dermatology - nystatin-triamcinolone (MYCOLOG II) cream; Apply 1 application topically 2 (two) times daily.  Dispense: 30 g; Refill: 3  10. Hereditary and idiopathic peripheral neuropathy - pregabalin (LYRICA) 50 MG capsule; Take one capsule 3 times daily to help neuropathic pain.  Dispense: 42 capsule; Refill: 0  11. Encounter for immunization Flu vaccine

## 2014-12-27 ENCOUNTER — Other Ambulatory Visit: Payer: Self-pay | Admitting: *Deleted

## 2014-12-27 DIAGNOSIS — M7989 Other specified soft tissue disorders: Secondary | ICD-10-CM

## 2014-12-27 DIAGNOSIS — G894 Chronic pain syndrome: Secondary | ICD-10-CM

## 2014-12-28 ENCOUNTER — Ambulatory Visit (HOSPITAL_COMMUNITY)
Admission: RE | Admit: 2014-12-28 | Discharge: 2014-12-28 | Disposition: A | Discharge status: Home / Self Care | Payer: Medicare Other | Source: Ambulatory Visit | Attending: Vascular Surgery | Admitting: Vascular Surgery

## 2014-12-28 ENCOUNTER — Other Ambulatory Visit: Payer: Self-pay | Admitting: *Deleted

## 2014-12-28 ENCOUNTER — Other Ambulatory Visit: Payer: Self-pay

## 2014-12-28 ENCOUNTER — Telehealth: Payer: Self-pay | Admitting: *Deleted

## 2014-12-28 DIAGNOSIS — E1140 Type 2 diabetes mellitus with diabetic neuropathy, unspecified: Secondary | ICD-10-CM | POA: Insufficient documentation

## 2014-12-28 DIAGNOSIS — M7989 Other specified soft tissue disorders: Secondary | ICD-10-CM | POA: Diagnosis not present

## 2014-12-28 DIAGNOSIS — G894 Chronic pain syndrome: Secondary | ICD-10-CM | POA: Diagnosis not present

## 2014-12-28 DIAGNOSIS — I10 Essential (primary) hypertension: Secondary | ICD-10-CM | POA: Diagnosis not present

## 2014-12-28 DIAGNOSIS — E1165 Type 2 diabetes mellitus with hyperglycemia: Secondary | ICD-10-CM | POA: Diagnosis not present

## 2014-12-28 DIAGNOSIS — G5610 Other lesions of median nerve, unspecified upper limb: Secondary | ICD-10-CM

## 2014-12-28 MED ORDER — METHADONE HCL 10 MG PO TABS: ORAL_TABLET | ORAL | Status: DC

## 2014-12-28 NOTE — Telephone Encounter (Signed)
Patient requested and will pick up 

## 2014-12-28 NOTE — Telephone Encounter (Signed)
Cover My Meds Prior Authorization done on Nystatin Triamcinolone Cream. Key [id number]. Will get determination in 48-72 hours by fax.  [facility] [phone]

## 2015-01-01 NOTE — Telephone Encounter (Signed)
[facility] DENIED coverage for Nystatin/Triamcinolone. Has to use 3 of the following and a specific medical reason you are unable: Ciclopirox, Clotrimazole, Clotrimazole-betamethasone, Econazole, Ketoconazole Request Reference #: [id number] Member ID: [id number]

## 2015-01-08 ENCOUNTER — Telehealth: Payer: Self-pay | Admitting: *Deleted

## 2015-01-08 NOTE — Telephone Encounter (Signed)
Patient called and wanted to let you know that the Lyrica samples you gave him work better than the Gabapentin. Patient wants a Rx for this sent to [facility]. Please Advise.

## 2015-01-09 ENCOUNTER — Other Ambulatory Visit: Payer: Self-pay | Admitting: Internal Medicine

## 2015-01-09 DIAGNOSIS — G609 Hereditary and idiopathic neuropathy, unspecified: Secondary | ICD-10-CM

## 2015-01-09 MED ORDER — PREGABALIN 50 MG PO CAPS: ORAL_CAPSULE | ORAL | Status: DC

## 2015-01-09 NOTE — Telephone Encounter (Signed)
Prescription written and will be faxed.

## 2015-01-14 ENCOUNTER — Encounter: Payer: Self-pay | Admitting: Internal Medicine

## 2015-01-21 ENCOUNTER — Other Ambulatory Visit: Payer: Medicare Other

## 2015-01-23 ENCOUNTER — Ambulatory Visit: Payer: Medicare Other | Admitting: Internal Medicine

## 2015-01-31 ENCOUNTER — Encounter: Payer: Self-pay | Admitting: Internal Medicine

## 2015-02-01 ENCOUNTER — Other Ambulatory Visit: Payer: Self-pay

## 2015-02-01 DIAGNOSIS — G5610 Other lesions of median nerve, unspecified upper limb: Secondary | ICD-10-CM

## 2015-02-01 MED ORDER — METHADONE HCL 10 MG PO TABS: ORAL_TABLET | ORAL | Status: DC

## 2015-02-11 ENCOUNTER — Other Ambulatory Visit: Payer: Medicare Other

## 2015-02-12 ENCOUNTER — Encounter: Payer: Self-pay | Admitting: Internal Medicine

## 2015-02-12 ENCOUNTER — Ambulatory Visit (INDEPENDENT_AMBULATORY_CARE_PROVIDER_SITE_OTHER): Payer: Medicare Other | Admitting: Internal Medicine

## 2015-02-12 VITALS — BP 140/90 | HR 73 | Temp 98.2°F | Resp 20 | Ht 69.0 in | Wt 393.6 lb

## 2015-02-12 DIAGNOSIS — D649 Anemia, unspecified: Secondary | ICD-10-CM | POA: Diagnosis not present

## 2015-02-12 DIAGNOSIS — G894 Chronic pain syndrome: Secondary | ICD-10-CM

## 2015-02-12 DIAGNOSIS — E7800 Pure hypercholesterolemia, unspecified: Secondary | ICD-10-CM

## 2015-02-12 DIAGNOSIS — I10 Essential (primary) hypertension: Secondary | ICD-10-CM

## 2015-02-12 DIAGNOSIS — G5610 Other lesions of median nerve, unspecified upper limb: Secondary | ICD-10-CM | POA: Diagnosis not present

## 2015-02-12 DIAGNOSIS — E1165 Type 2 diabetes mellitus with hyperglycemia: Secondary | ICD-10-CM | POA: Diagnosis not present

## 2015-02-12 DIAGNOSIS — G609 Hereditary and idiopathic neuropathy, unspecified: Secondary | ICD-10-CM | POA: Diagnosis not present

## 2015-02-12 DIAGNOSIS — R609 Edema, unspecified: Secondary | ICD-10-CM | POA: Diagnosis not present

## 2015-02-12 DIAGNOSIS — M170 Bilateral primary osteoarthritis of knee: Secondary | ICD-10-CM

## 2015-02-12 DIAGNOSIS — E1140 Type 2 diabetes mellitus with diabetic neuropathy, unspecified: Secondary | ICD-10-CM

## 2015-02-12 DIAGNOSIS — IMO0002 Reserved for concepts with insufficient information to code with codable children: Secondary | ICD-10-CM

## 2015-02-12 MED ORDER — PREGABALIN 75 MG PO CAPS: ORAL_CAPSULE | ORAL | Status: DC

## 2015-02-12 NOTE — Progress Notes (Signed)
Patient ID: Phillip Maldonado, male   DOB: 03/20/1950, 64 y.o.   MRN: [id number]    Facility  [facility]    Place of Service:   OFFICE    Allergies  Allergen Reactions  . Adhesive [Tape] Other (See Comments)    Unknown paper tape ok to use   . Feldene [Piroxicam] Other (See Comments)    unknown  . Latex Other (See Comments)    Rash and itching    Chief Complaint  Patient presents with  . Medical Management of Chronic Issues    3 month follow-up for Hypertension, DM    HPI:   Edema, unspecified type - improved since prior visits  DM type 2, uncontrolled, with neuropathy ([place]) -controlled  Hypercholesterolemia - controlled  Anemia, unspecified anemia type - follow up lab  Essential hypertension - controlled  Neuropathy, median nerve, unspecified laterality - only usng Lyrica  Primary osteoarthritis of both knees - has not gotten braces yet  Chronic pain syndrome - benefits from current medications  Hereditary and idiopathic peripheral neuropathy - benefits from Lyrica  Mild increase in dyspnea today. Has not noticed increase in edema.    Medications: Patient's Medications  New Prescriptions   No medications on file  Previous Medications   ACETAMINOPHEN (TYLENOL) 650 MG CR TABLET    Take 1,950 mg by mouth 2 (two) times daily.   AMLODIPINE (NORVASC) 10 MG TABLET    Take 10 mg by mouth daily.   ATORVASTATIN (LIPITOR) 40 MG TABLET    take 1 tablet once daily   DULOXETINE (CYMBALTA) 60 MG CAPSULE    One twice daily to help neuropathy   GABAPENTIN (NEURONTIN) 800 MG TABLET    TAKE TWO TABLETS EVERY MORNING, ONE TABLET AT SUPPER, AND TWO TABLETS AT BEDTIME   GLUCOSE BLOOD (ACCU-CHEK AVIVA PLUS) TEST STRIP    1 each by Other route See admin instructions. Check blood sugar twice daily   INSULIN GLARGINE (LANTUS SOLOSTAR) 100 UNIT/ML SOLOSTAR PEN    INJECT 22 - 24 UNITS EVERY MORNING   INSULIN PEN NEEDLE (NOVOFINE) 30G X 8 MM MISC    Inject 1 packet into the skin See admin  instructions. Use with insulin once daily.   LISINOPRIL (PRINIVIL,ZESTRIL) 20 MG TABLET    TAKE ONE TABLET DAILY to control blood pressure and strengthen the heart   LUBIPROSTONE (AMITIZA) 24 MCG CAPSULE    Take 1 capsule (24 mcg total) by mouth 2 (two) times daily with a meal.   METFORMIN (GLUCOPHAGE) 1000 MG TABLET    Take 1 tablet (1,000 mg total) by mouth 2 (two) times daily with a meal.   METHADONE (DOLOPHINE) 10 MG TABLET    Take One tablet by mouth 4 times daily  to control pain   METOPROLOL (LOPRESSOR) 50 MG TABLET    TAKE ONE TABLET TWICE DAILY   MULTIPLE VITAMIN (MULITIVITAMIN WITH MINERALS) TABS    Take 1 tablet by mouth daily.   NYSTATIN-TRIAMCINOLONE (MYCOLOG II) CREAM    Apply 1 application topically 2 (two) times daily.   PANTOPRAZOLE (PROTONIX) 40 MG TABLET    TAKE 1 TABLET TWO TIMES DAILY.   POLYETHYLENE GLYCOL (MIRALAX / GLYCOLAX) PACKET    Take 17 g by mouth daily as needed for mild constipation or moderate constipation.   PREGABALIN (LYRICA) 50 MG CAPSULE    Take one capsule 3 times daily to help neuropathic pain.   TORSEMIDE (DEMADEX) 100 MG TABLET    Take 1 tablet (100 mg total)  by mouth 2 (two) times daily.   TRAZODONE (DESYREL) 100 MG TABLET    TAKE 3 TABLETS (300MG) AT BEDTIME   TRAZODONE (DESYREL) 150 MG TABLET    Take 2 tablets (300 mg total) by mouth at bedtime. 2 by mouth at bedtime   TRAZODONE (DESYREL) 150 MG TABLET    TAKE TWO TABLETS AT BEDTIME  Modified Medications   No medications on file  Discontinued Medications   GABAPENTIN (NEURONTIN) 800 MG TABLET    One up to 3 times daily to help pains. Taper off one weekly until you are off the medication.    Review of Systems  Constitutional: Positive for activity change and fatigue. Negative for fever, appetite change and unexpected weight change.       Morbidly obese  HENT: Negative for congestion, ear pain, hearing loss, rhinorrhea, sore throat, tinnitus, trouble swallowing and voice change.   Eyes:        Corrective lenses  Respiratory: Positive for shortness of breath. Negative for cough, choking, chest tightness and wheezing.   Cardiovascular: Positive for chest pain and leg swelling. Negative for palpitations.  Gastrointestinal: Negative for nausea, abdominal pain, diarrhea, constipation and abdominal distention.  Endocrine: Negative for cold intolerance, heat intolerance, polydipsia, polyphagia and polyuria.  Genitourinary: Negative for dysuria, urgency, frequency and testicular pain.       Not incontinent  Musculoskeletal: Positive for back pain. Negative for myalgias, arthralgias, gait problem and neck pain.  Skin: Positive for rash.       erythema of the anterior shins. History of intertrigo under the breasts and in the groin area bilaterally.  Blotchy, red facial rash present since Spring 2015. Chronic venous stasis changes of both lower legs.  Allergic/Immunologic: Negative.   Neurological: Positive for light-headedness and numbness (both hands demonstrate findiings compatible with carpal tunnel syndrome). Negative for dizziness, tremors, syncope, speech difficulty, weakness and headaches.       Wears glove on the right hand due to chronic painful median nerve neuropathy.History of painful neuropathy of the feet and lower legs.  Hematological: Negative for adenopathy. Does not bruise/bleed easily.  Psychiatric/Behavioral: Negative for hallucinations, behavioral problems, confusion, sleep disturbance and decreased concentration. The patient is not nervous/anxious.        Mild memory difficulty    Filed Vitals:   02/12/15 1408  BP: 140/90  Pulse: 73  Temp: 98.2 F (36.8 C)  TempSrc: Oral  Resp: 20  Height: '5\' 9"'  (1.753 m)  Weight: 393 lb 9.6 oz (178.536 kg)  SpO2: 96%   Body mass index is 58.1 kg/(m^2).  Physical Exam  Constitutional: He is oriented to person, place, and time. He appears well-developed and well-nourished. No distress.  Morbid obesity  HENT:  Right Ear:  External ear normal.  Left Ear: External ear normal.  Nose: Nose normal.  Mouth/Throat: Oropharynx is clear and moist. No oropharyngeal exudate.  Eyes: Conjunctivae and EOM are normal. Pupils are equal, round, and reactive to light.  Neck: No JVD present. No tracheal deviation present. No thyromegaly present.  Cardiovascular: Normal rate, regular rhythm, normal heart sounds and intact distal pulses.  Exam reveals no gallop and no friction rub.   No murmur heard. Pulmonary/Chest: No respiratory distress. He has no wheezes. He has no rales. He exhibits no tenderness.  Abdominal: He exhibits no distension and no mass. There is no tenderness.  Musculoskeletal: He exhibits edema. He exhibits no tenderness.  Using cane. Unstable gait.  Lymphadenopathy:    He has no cervical adenopathy.  Neurological: He is alert and oriented to person, place, and time. He has normal reflexes. No cranial nerve deficit. Coordination normal.  08/15/14 MMSE 27/30 09/06/2014  PNCV showed severe bilateral median nerve neuropathy consistent with severe carpal tunnel syndrome.  Skin: There is erythema (both shins). No pallor.  Chronic venous stasis changes of both anterior legs.  Psychiatric: He has a normal mood and affect. His behavior is normal. Judgment and thought content normal.    Labs reviewed: Lab Summary Latest Ref Rng 10/11/2014 08/29/2014  Hemoglobin 12.6 - 17.7 g/dL 11.6(L) (None)  Hematocrit 37.5 - 51.0 % 37.5 (None)  White count 3.4 - 10.8 x10E3/uL 8.3 (None)  Platelet count - (None) (None)  Sodium 134 - 144 mmol/L 142 137  Potassium 3.5 - 5.2 mmol/L 3.8 4.8  Calcium 8.6 - 10.2 mg/dL 9.8 9.5  Phosphorus - (None) (None)  Creatinine 0.76 - 1.27 mg/dL 1.05 1.16  AST 0 - 40 IU/L 26 (None)  Alk Phos 39 - 117 IU/L 69 (None)  Bilirubin 0.0 - 1.2 mg/dL 0.4 (None)  Glucose 65 - 99 mg/dL 210(H) 168(H)  Cholesterol - (None) (None)  HDL cholesterol - (None) (None)  Triglycerides - (None) (None)  LDL Direct -  (None) (None)  LDL Calc - (None) (None)  Total protein - (None) (None)  Albumin 3.6 - 4.8 g/dL 3.8 (None)   Lab Results  Component Value Date   TSH 2.530 03/14/2014   Lab Results  Component Value Date   BUN 18 10/11/2014   Lab Results  Component Value Date   HGBA1C 8.5* 10/11/2014    Assessment/Plan  1. DM type 2, uncontrolled, with neuropathy ([place]) - Hemoglobin B0W - Basic metabolic panel  2. Hypercholesterolemia - Lipid panel  3. Anemia, unspecified anemia type - CBC With Differential  4. Edema, unspecified type improved  5. Essential hypertension controlled  6. Neuropathy, median nerve, unspecified laterality - Drug Screen, Urine - Methadone (GC/LC/MS), urine  7. Primary osteoarthritis of both knees Continue current medication  8. Chronic pain syndrome  9. Hereditary and idiopathic peripheral neuropathy Increase Lyrica to 75 mg tid

## 2015-02-13 LAB — LIPID PANEL
CHOLESTEROL TOTAL: 223 mg/dL — AB (ref 100–199)
Chol/HDL Ratio: 5.9 ratio units — ABNORMAL HIGH (ref 0.0–5.0)
HDL: 38 mg/dL — ABNORMAL LOW (ref >39)
LDL Calculated: 116 mg/dL — ABNORMAL HIGH (ref 0–99)
Triglycerides: 347 mg/dL — ABNORMAL HIGH (ref 0–149)
VLDL CHOLESTEROL CAL: 69 mg/dL — AB (ref 5–40)

## 2015-02-13 LAB — CBC WITH DIFFERENTIAL
BASOS ABS: 0 10*3/uL (ref 0.0–0.2)
BASOS: 1 %
EOS (ABSOLUTE): 0.3 10*3/uL (ref 0.0–0.4)
Eos: 4 %
Hematocrit: 37.9 % (ref 37.5–51.0)
Hemoglobin: 12.3 g/dL — ABNORMAL LOW (ref 12.6–17.7)
IMMATURE GRANS (ABS): 0 10*3/uL (ref 0.0–0.1)
IMMATURE GRANULOCYTES: 0 %
LYMPHS: 29 %
Lymphocytes Absolute: 1.9 10*3/uL (ref 0.7–3.1)
MCH: 27.4 pg (ref 26.6–33.0)
MCHC: 32.5 g/dL (ref 31.5–35.7)
MCV: 84 fL (ref 79–97)
MONOCYTES: 7 %
Monocytes Absolute: 0.4 10*3/uL (ref 0.1–0.9)
NEUTROS ABS: 3.8 10*3/uL (ref 1.4–7.0)
NEUTROS PCT: 59 %
RBC: 4.49 x10E6/uL (ref 4.14–5.80)
RDW: 15.9 % — AB (ref 12.3–15.4)
WBC: 6.4 10*3/uL (ref 3.4–10.8)

## 2015-02-13 LAB — BASIC METABOLIC PANEL
BUN / CREAT RATIO: 19 (ref 10–22)
BUN: 15 mg/dL (ref 8–27)
CHLORIDE: 98 mmol/L (ref 97–106)
CO2: 28 mmol/L (ref 18–29)
Calcium: 9.6 mg/dL (ref 8.6–10.2)
Creatinine, Ser: 0.77 mg/dL (ref 0.76–1.27)
GFR calc Af Amer: 112 mL/min/{1.73_m2} (ref >59)
GFR calc non Af Amer: 97 mL/min/{1.73_m2} (ref >59)
Glucose: 145 mg/dL — ABNORMAL HIGH (ref 65–99)
Potassium: 4.5 mmol/L (ref 3.5–5.2)
SODIUM: 139 mmol/L (ref 136–144)

## 2015-02-13 LAB — HEMOGLOBIN A1C
ESTIMATED AVERAGE GLUCOSE: 194 mg/dL
Hgb A1c MFr Bld: 8.4 % — ABNORMAL HIGH (ref 4.8–5.6)

## 2015-02-22 LAB — DRUG SCREEN, URINE
Amphetamines, Urine: NEGATIVE ng/mL
BENZODIAZEPINE QUANT UR: NEGATIVE ng/mL
Barbiturate screen, urine: NEGATIVE ng/mL
CANNABINOID QUANT UR: NEGATIVE ng/mL
Cocaine (Metab.): NEGATIVE ng/mL
Opiate Quant, Ur: NEGATIVE ng/mL
PCP QUANT UR: NEGATIVE ng/mL

## 2015-02-22 LAB — METHADONE (GC/LC/MS), URINE
METHADONE: POSITIVE — AB
Methadone GC/MS Conf: 3535 ng/mL

## 2015-02-25 ENCOUNTER — Other Ambulatory Visit: Payer: Self-pay | Admitting: Internal Medicine

## 2015-03-07 ENCOUNTER — Other Ambulatory Visit: Payer: Self-pay | Admitting: *Deleted

## 2015-03-07 DIAGNOSIS — G5610 Other lesions of median nerve, unspecified upper limb: Secondary | ICD-10-CM

## 2015-03-07 MED ORDER — METHADONE HCL 10 MG PO TABS: ORAL_TABLET | ORAL | Status: DC

## 2015-03-07 NOTE — Telephone Encounter (Signed)
Patient requested and will pick up 

## 2015-03-21 ENCOUNTER — Other Ambulatory Visit: Payer: Self-pay | Admitting: Internal Medicine

## 2015-03-28 ENCOUNTER — Other Ambulatory Visit: Payer: Self-pay | Admitting: Internal Medicine

## 2015-04-01 ENCOUNTER — Other Ambulatory Visit: Payer: Self-pay | Admitting: *Deleted

## 2015-04-01 DIAGNOSIS — G609 Hereditary and idiopathic neuropathy, unspecified: Secondary | ICD-10-CM

## 2015-04-01 MED ORDER — PREGABALIN 75 MG PO CAPS: ORAL_CAPSULE | ORAL | Status: DC

## 2015-04-01 MED ORDER — PREGABALIN 75 MG PO CAPS
ORAL_CAPSULE | ORAL | Status: DC
Start: 2015-04-01 — End: 2015-04-01

## 2015-04-01 NOTE — Telephone Encounter (Signed)
Patient requested 

## 2015-04-05 ENCOUNTER — Other Ambulatory Visit: Payer: Self-pay | Admitting: *Deleted

## 2015-04-05 DIAGNOSIS — G5610 Other lesions of median nerve, unspecified upper limb: Secondary | ICD-10-CM

## 2015-04-05 MED ORDER — METHADONE HCL 10 MG PO TABS: ORAL_TABLET | ORAL | Status: DC

## 2015-04-05 NOTE — Telephone Encounter (Signed)
Patient requested and will pick up 

## 2015-04-24 ENCOUNTER — Ambulatory Visit (INDEPENDENT_AMBULATORY_CARE_PROVIDER_SITE_OTHER): Payer: Medicare Other | Admitting: Internal Medicine

## 2015-04-24 ENCOUNTER — Encounter: Payer: Self-pay | Admitting: Internal Medicine

## 2015-04-24 VITALS — BP 138/88 | HR 82 | Temp 97.9°F | Ht 69.0 in | Wt 395.5 lb

## 2015-04-24 DIAGNOSIS — G609 Hereditary and idiopathic neuropathy, unspecified: Secondary | ICD-10-CM

## 2015-04-24 DIAGNOSIS — E1165 Type 2 diabetes mellitus with hyperglycemia: Secondary | ICD-10-CM

## 2015-04-24 DIAGNOSIS — M47816 Spondylosis without myelopathy or radiculopathy, lumbar region: Secondary | ICD-10-CM | POA: Diagnosis not present

## 2015-04-24 DIAGNOSIS — IMO0002 Reserved for concepts with insufficient information to code with codable children: Secondary | ICD-10-CM

## 2015-04-24 DIAGNOSIS — I10 Essential (primary) hypertension: Secondary | ICD-10-CM | POA: Diagnosis not present

## 2015-04-24 DIAGNOSIS — E1140 Type 2 diabetes mellitus with diabetic neuropathy, unspecified: Secondary | ICD-10-CM | POA: Diagnosis not present

## 2015-04-24 DIAGNOSIS — F332 Major depressive disorder, recurrent severe without psychotic features: Secondary | ICD-10-CM

## 2015-04-24 DIAGNOSIS — R609 Edema, unspecified: Secondary | ICD-10-CM | POA: Diagnosis not present

## 2015-04-24 DIAGNOSIS — F329 Major depressive disorder, single episode, unspecified: Secondary | ICD-10-CM | POA: Insufficient documentation

## 2015-04-24 MED ORDER — DULOXETINE HCL 60 MG PO CPEP
ORAL_CAPSULE | ORAL | Status: DC
Start: 2015-04-24 — End: 2015-11-27

## 2015-04-24 MED ORDER — PREGABALIN 100 MG PO CAPS: ORAL_CAPSULE | ORAL | Status: DC

## 2015-04-24 NOTE — Patient Instructions (Signed)
Bring copy of Advance Directives- Health Care Power of Attorney and/or Living Will to next appointment.   

## 2015-04-24 NOTE — Progress Notes (Signed)
Patient ID: Phillip Maldonado, male   DOB: September 15, 1950, 65 y.o.   MRN: [id number]    Facility  [facility]    Place of Service:   OFFICE    Allergies  Allergen Reactions  . Adhesive [Tape] Other (See Comments)    Unknown paper tape ok to use   . Feldene [Piroxicam] Other (See Comments)    unknown  . Latex Other (See Comments)    Rash and itching    Chief Complaint  Patient presents with  . Acute Visit    Pt c/o back pain x several months, no known injury. Patient will see specialist tomorrow (Dr.Berkadale) in [place]  . Medication Management    Pt wish to discuss Lyrica    HPI:  Essential hypertension - initial blood pressure was high, but after it been in the office a while, it did drop to normal. Denies headaches, palpitations, or chest pain.  Hereditary and idiopathic peripheral neuropathy - feet remain numb and burning. Lyrica is not working as well as it did initially. He thinks he needs something stronger.  Edema, unspecified type - improved. Previous rash and weeping has also cleared up.  DM type 2, uncontrolled, with neuropathy ([place]) - no recent lab.  Arthropathy of lumbar facet joint -chronic back discomfort next him feel miserable all the time. It limits his ability to walk and even set up. Tomorrow, he is seeing a back doctor in [place] that he is seen in the past.   Severe episode of recurrent major depressive disorder, without psychotic features ([place]) - patient is not sure if he is taking duloxetine anymore.    Medications: Patient's Medications  New Prescriptions   No medications on file  Previous Medications   ACETAMINOPHEN (TYLENOL) 650 MG CR TABLET    Take 1,950 mg by mouth 2 (two) times daily.   AMLODIPINE (NORVASC) 10 MG TABLET    Take 10 mg by mouth daily.   ATORVASTATIN (LIPITOR) 40 MG TABLET    take 1 tablet once daily   DULOXETINE (CYMBALTA) 60 MG CAPSULE    One twice daily to help neuropathy   GLUCOSE BLOOD (ACCU-CHEK AVIVA PLUS) TEST STRIP    1  each by Other route See admin instructions. Check blood sugar twice daily   INSULIN PEN NEEDLE (NOVOFINE) 30G X 8 MM MISC    Inject 1 packet into the skin See admin instructions. Use with insulin once daily.   LANTUS SOLOSTAR 100 UNIT/ML SOLOSTAR PEN    inject 22-24 UNITS EVERY MORNING   LISINOPRIL (PRINIVIL,ZESTRIL) 20 MG TABLET    TAKE ONE TABLET DAILY to control blood pressure and strengthen the heart   LUBIPROSTONE (AMITIZA) 24 MCG CAPSULE    Take 1 capsule (24 mcg total) by mouth 2 (two) times daily with a meal.   METFORMIN (GLUCOPHAGE) 1000 MG TABLET    Take 1 tablet (1,000 mg total) by mouth 2 (two) times daily with a meal.   METHADONE (DOLOPHINE) 10 MG TABLET    Take One tablet by mouth 4 times daily  to control pain   METOPROLOL (LOPRESSOR) 50 MG TABLET    TAKE ONE TABLET TWICE DAILY   MULTIPLE VITAMIN (MULITIVITAMIN WITH MINERALS) TABS    Take 1 tablet by mouth daily.   NYSTATIN-TRIAMCINOLONE (MYCOLOG II) CREAM    Apply 1 application topically 2 (two) times daily.   PANTOPRAZOLE (PROTONIX) 40 MG TABLET    TAKE 1 TABLET TWO TIMES DAILY.   POLYETHYLENE GLYCOL (MIRALAX / GLYCOLAX) PACKET  Take 17 g by mouth daily as needed for mild constipation or moderate constipation.   PREGABALIN (LYRICA) 75 MG CAPSULE    Take one capsule 3 times daily to help pain   TORSEMIDE (DEMADEX) 100 MG TABLET    Take 1 tablet (100 mg total) by mouth 2 (two) times daily.   TRAZODONE (DESYREL) 100 MG TABLET    TAKE 3 TABLETS (300MG) AT BEDTIME  Modified Medications   No medications on file  Discontinued Medications   No medications on file    Review of Systems  Constitutional: Positive for activity change and fatigue. Negative for fever, appetite change and unexpected weight change.       Morbidly obese  HENT: Negative for congestion, ear pain, hearing loss, rhinorrhea, sore throat, tinnitus, trouble swallowing and voice change.   Eyes:       Corrective lenses  Respiratory: Positive for shortness of  breath. Negative for cough, choking, chest tightness and wheezing.   Cardiovascular: Positive for chest pain and leg swelling. Negative for palpitations.  Gastrointestinal: Negative for nausea, abdominal pain, diarrhea, constipation and abdominal distention.  Endocrine: Negative for cold intolerance, heat intolerance, polydipsia, polyphagia and polyuria.  Genitourinary: Negative for dysuria, urgency, frequency and testicular pain.       Not incontinent  Musculoskeletal: Positive for back pain. Negative for myalgias, arthralgias, gait problem and neck pain.  Skin: Positive for rash.       erythema of the anterior shins. History of intertrigo under the breasts and in the groin area bilaterally.  Blotchy, red facial rash present since Spring 2015. Chronic venous stasis changes of both lower legs.  Allergic/Immunologic: Negative.   Neurological: Positive for light-headedness and numbness (both hands demonstrate findiings compatible with carpal tunnel syndrome). Negative for dizziness, tremors, syncope, speech difficulty, weakness and headaches.       Wears glove on the right hand due to chronic painful median nerve neuropathy.History of painful neuropathy of the feet and lower legs.  Hematological: Negative for adenopathy. Does not bruise/bleed easily.  Psychiatric/Behavioral: Negative for hallucinations, behavioral problems, confusion, sleep disturbance and decreased concentration. The patient is not nervous/anxious.        Mild memory difficulty    Filed Vitals:   04/24/15 1509 04/24/15 1628  BP: 165/100 138/88  Pulse: 82   Temp: 97.9 F (36.6 C)   TempSrc: Oral   Height: '5\' 9"'  (1.753 m)   Weight: 395 lb 8 oz (179.398 kg)   SpO2: 94%    Body mass index is 58.38 kg/(m^2). Filed Weights   04/24/15 1509  Weight: 395 lb 8 oz (179.398 kg)     Physical Exam  Constitutional: He is oriented to person, place, and time. He appears well-developed and well-nourished. No distress.  Morbid  obesity  HENT:  Right Ear: External ear normal.  Left Ear: External ear normal.  Nose: Nose normal.  Mouth/Throat: Oropharynx is clear and moist. No oropharyngeal exudate.  Eyes: Conjunctivae and EOM are normal. Pupils are equal, round, and reactive to light.  Neck: No JVD present. No tracheal deviation present. No thyromegaly present.  Cardiovascular: Normal rate, regular rhythm, normal heart sounds and intact distal pulses.  Exam reveals no gallop and no friction rub.   No murmur heard. Pulmonary/Chest: No respiratory distress. He has no wheezes. He has no rales. He exhibits no tenderness.  Abdominal: He exhibits no distension and no mass. There is no tenderness.  Musculoskeletal: He exhibits edema. He exhibits no tenderness.  Using cane. Unstable gait.  Lymphadenopathy:    He has no cervical adenopathy.  Neurological: He is alert and oriented to person, place, and time. He has normal reflexes. No cranial nerve deficit. Coordination normal.  08/15/14 MMSE 27/30 09/06/2014  PNCV showed severe bilateral median nerve neuropathy consistent with severe carpal tunnel syndrome.  Skin: There is erythema (both shins). No pallor.  Chronic venous stasis changes of both anterior legs.  Psychiatric: He has a normal mood and affect. His behavior is normal. Judgment and thought content normal.    Labs reviewed: Lab Summary Latest Ref Rng 02/12/2015 10/11/2014  Hemoglobin 12.6 - 17.7 g/dL 12.3(L) 11.6(L)  Hematocrit 37.5 - 51.0 % 37.9 37.5  White count 3.4 - 10.8 x10E3/uL 6.4 8.3  Platelet count - (None) (None)  Sodium 136 - 144 mmol/L 139 142  Potassium 3.5 - 5.2 mmol/L 4.5 3.8  Calcium 8.6 - 10.2 mg/dL 9.6 9.8  Phosphorus - (None) (None)  Creatinine 0.76 - 1.27 mg/dL 0.77 1.05  AST 0 - 40 IU/L (None) 26  Alk Phos 39 - 117 IU/L (None) 69  Bilirubin 0.0 - 1.2 mg/dL (None) 0.4  Glucose 65 - 99 mg/dL 145(H) 210(H)  Cholesterol - (None) (None)  HDL cholesterol >39 mg/dL 38(L) (None)    Triglycerides 0 - 149 mg/dL 347(H) (None)  LDL Direct - (None) (None)  LDL Calc 0 - 99 mg/dL 116(H) (None)  Total protein - (None) (None)  Albumin 3.6 - 4.8 g/dL (None) 3.8   Lab Results  Component Value Date   TSH 2.530 03/14/2014   TSH 1.470 12/26/2013   TSH 4.000 09/16/2013   Lab Results  Component Value Date   BUN 15 02/12/2015   BUN 18 10/11/2014   BUN 26 08/29/2014   Lab Results  Component Value Date   HGBA1C 8.4* 02/12/2015   HGBA1C 8.5* 10/11/2014   HGBA1C 9.3* 07/24/2014    Assessment/Plan  1. Essential hypertension controlled  2. Hereditary and idiopathic peripheral neuropathy - pregabalin (LYRICA) 100 MG capsule; Take one 3 times daily to help neuropathic pains  Dispense: 90 capsule; Refill: 5  3. Edema, unspecified type improved  4. DM type 2, uncontrolled, with neuropathy ([place]) follow lab prior to next visit  5. Arthropathy of lumbar facet joint Likely source of chronic back pain  6. Severe episode of recurrent major depressive disorder, without psychotic features ([place]) Resume duloxetine

## 2015-04-26 ENCOUNTER — Encounter: Payer: Self-pay | Admitting: Internal Medicine

## 2015-04-26 DIAGNOSIS — Z79891 Long term (current) use of opiate analgesic: Secondary | ICD-10-CM | POA: Insufficient documentation

## 2015-04-30 ENCOUNTER — Other Ambulatory Visit: Payer: Self-pay | Admitting: Internal Medicine

## 2015-05-07 ENCOUNTER — Ambulatory Visit: Payer: Medicare Other | Admitting: Internal Medicine

## 2015-05-09 ENCOUNTER — Other Ambulatory Visit: Payer: Self-pay

## 2015-05-09 DIAGNOSIS — G5610 Other lesions of median nerve, unspecified upper limb: Secondary | ICD-10-CM

## 2015-05-09 MED ORDER — METHADONE HCL 10 MG PO TABS: ORAL_TABLET | ORAL | Status: DC

## 2015-05-21 ENCOUNTER — Other Ambulatory Visit: Payer: Self-pay | Admitting: Internal Medicine

## 2015-05-21 ENCOUNTER — Telehealth: Payer: Self-pay | Admitting: *Deleted

## 2015-05-21 NOTE — Telephone Encounter (Signed)
Patient notified and agreed. Will call back if no improvement

## 2015-05-21 NOTE — Telephone Encounter (Signed)
He could use a fleets enema. Another over-the-counter laxative could be a bottle of magnesium citrate. If these don't work, I can put him on Amitiza.

## 2015-05-21 NOTE — Telephone Encounter (Signed)
Patient called and stated that he went back on the Medication Duloxetine 60mg  bid on 04/24/15 and ever since he has been constipated. He has tried all of the OTC medications like MOM, Colace and laxitives. He stated that he has gained 20lbs in 3 weeks and feels like he has a blockage. Please Advise.

## 2015-06-04 ENCOUNTER — Other Ambulatory Visit: Payer: Self-pay | Admitting: Internal Medicine

## 2015-06-14 ENCOUNTER — Other Ambulatory Visit: Payer: Self-pay | Admitting: *Deleted

## 2015-06-14 DIAGNOSIS — G5610 Other lesions of median nerve, unspecified upper limb: Secondary | ICD-10-CM

## 2015-06-14 MED ORDER — METHADONE HCL 10 MG PO TABS: ORAL_TABLET | ORAL | Status: DC

## 2015-06-14 NOTE — Telephone Encounter (Signed)
Patient requested and will pick up 

## 2015-06-18 ENCOUNTER — Ambulatory Visit: Payer: Medicare Other | Admitting: Internal Medicine

## 2015-06-18 ENCOUNTER — Encounter: Payer: Self-pay | Admitting: Internal Medicine

## 2015-06-27 ENCOUNTER — Encounter: Payer: Self-pay | Admitting: Nurse Practitioner

## 2015-06-27 ENCOUNTER — Ambulatory Visit (INDEPENDENT_AMBULATORY_CARE_PROVIDER_SITE_OTHER): Payer: Medicare Other | Admitting: Nurse Practitioner

## 2015-06-27 VITALS — BP 142/82 | HR 86 | Temp 98.2°F | Ht 69.0 in | Wt 395.8 lb

## 2015-06-27 DIAGNOSIS — J069 Acute upper respiratory infection, unspecified: Secondary | ICD-10-CM | POA: Diagnosis not present

## 2015-06-27 DIAGNOSIS — D649 Anemia, unspecified: Secondary | ICD-10-CM

## 2015-06-27 DIAGNOSIS — R609 Edema, unspecified: Secondary | ICD-10-CM

## 2015-06-27 DIAGNOSIS — E7800 Pure hypercholesterolemia, unspecified: Secondary | ICD-10-CM | POA: Diagnosis not present

## 2015-06-27 DIAGNOSIS — G894 Chronic pain syndrome: Secondary | ICD-10-CM

## 2015-06-27 MED ORDER — AZITHROMYCIN 250 MG PO TABS: 250.0000 mg | ORAL_TABLET | Freq: Every day | ORAL | Status: DC

## 2015-06-27 NOTE — Progress Notes (Signed)
Patient ID: Phillip Maldonado, male   DOB: [birth year], 65 y.o.   MRN: [id number]   Location:   [place] office   Place of Service:   [facility] office  Provider: Marlana Latus NP  Code Status: DNR Goals of Care:  Advanced Directives 06/27/2015  Does patient have an advance directive? No  Type of Advance Directive -  Does patient want to make changes to advanced directive? -  Copy of advanced directive(s) in chart? -     Chief Complaint  Patient presents with  . Sore Throat    started a week ago, worst we patient eat or talk, says it feels like there is a mass, ears hurts a little bit,having issues with breathing    HPI: Patient is a 65 y.o. male seen today for an acute visit for watery eyes and running nose for a while, sore throat and choked on a pill last night, no fever, chills, chest pain, or SOB, denied phlegm production.    Hx of T2DM, HTN, depression, diabetic neuropathy, chronic pain syndrome, GERD, constipation.   Past Medical History  Diagnosis Date  . CHF (congestive heart failure) ([place])   . Hypertension   . Anemia   . Angina   . Shortness of breath   . Anxiety   . Blood transfusion     last 10' 14- "GI bleed"  . Headache(784.0)   . Depression   . Hepatitis B   . Neuromuscular disorder ([place])   . Arthritis   . Diabetes mellitus ([place]) 09/18/2011  . Atrial fibrillation with RVR ([place]) 01/24/2013  . Renal insufficiency   . DJD (degenerative joint disease)   . History of alcohol abuse     last drink in 1993  . Obesity   . Diverticulosis   . Fatty liver   . Sleep apnea, obstructive     does where bipap. 06-04-14 "doesn't use much now, no mask/tubing now.  . Pneumonia     not at present time  . Edema extremities     bilateral lower extremities-"weepy areas due to fluid retention"  . Fundic gland polyps of stomach, benign   . DM type 2, uncontrolled, with neuropathy ([place]) 12/26/2013  . Neuropathy, median nerve 09/11/2014    Bilateral    Past Surgical History  Procedure  Laterality Date  . Gastric bypass  1977    Reversed in 1992 and revision 1994  . Diagnostic laparoscopy    . Esophagoscopy N/A 01/01/2013    Procedure: ESOPHAGOSCOPY;  Surgeon: Jeryl Columbia, MD;  Location: [place];  Service: Endoscopy;  Laterality: N/A;  . Esophagogastroduodenoscopy N/A 01/03/2013    Procedure: ESOPHAGOGASTRODUODENOSCOPY (EGD);  Surgeon: Arta Silence, MD;  Location: [facility];  Service: Endoscopy;  Laterality: N/A;  . Esophagogastroduodenoscopy N/A 01/03/2013    Procedure: ESOPHAGOGASTRODUODENOSCOPY (EGD);  Surgeon: Arta Silence, MD;  Location: [facility];  Service: Endoscopy;  Laterality: N/A;  . Esophagogastroduodenoscopy N/A 01/23/2013    Procedure: ESOPHAGOGASTRODUODENOSCOPY (EGD);  Surgeon: Lear Ng, MD;  Location: [facility];  Service: Endoscopy;  Laterality: N/A;  . Esophagogastroduodenoscopy Left 01/27/2013    Procedure: ESOPHAGOGASTRODUODENOSCOPY (EGD);  Surgeon: Lear Ng, MD;  Location: [facility];  Service: Endoscopy;  Laterality: Left;  . Knee surgery Left 2004  . Hydrocele excision  1996    x 2   . Abdominal surgery      [phone]  . Colonoscopy    . Back surgery      x 8 -,multiple fusions(cervical to lumbar)  .  Colonoscopy with propofol N/A 06/14/2014    Procedure: COLONOSCOPY WITH PROPOFOL;  Surgeon: Gatha Mayer, MD;  Location: [facility];  Service: Endoscopy;  Laterality: N/A;  . Esophagogastroduodenoscopy N/A 06/14/2014    Procedure: ESOPHAGOGASTRODUODENOSCOPY (EGD);  Surgeon: Gatha Mayer, MD;  Location: [facility];  Service: Endoscopy;  Laterality: N/A;    Allergies  Allergen Reactions  . Adhesive [Tape] Other (See Comments)    Unknown paper tape ok to use   . Feldene [Piroxicam] Other (See Comments)    unknown  . Latex Other (See Comments)    Rash and itching      Medication List       This list is accurate as of: 06/27/15 11:30 AM.  Always use your most recent med list.               ACCU-CHEK AVIVA  PLUS test strip  Generic drug:  glucose blood  1 each by Other route See admin instructions. Check blood sugar twice daily     acetaminophen 650 MG CR tablet  Commonly known as:  TYLENOL  Take 1,950 mg by mouth 2 (two) times daily.     amLODipine 10 MG tablet  Commonly known as:  NORVASC  Take 10 mg by mouth daily.     atorvastatin 40 MG tablet  Commonly known as:  LIPITOR  take 1 tablet once daily     azithromycin 250 MG tablet  Commonly known as:  ZITHROMAX Z-PAK  Take 1 tablet (250 mg total) by mouth daily.     DULoxetine 60 MG capsule  Commonly known as:  CYMBALTA  One twice daily to help neuropathy     LANTUS SOLOSTAR 100 UNIT/ML Solostar Pen  Generic drug:  Insulin Glargine  inject 22-24 UNITS EVERY MORNING     lisinopril 20 MG tablet  Commonly known as:  PRINIVIL,ZESTRIL  TAKE ONE TABLET DAILY to control blood pressure and strengthen the heart     lubiprostone 24 MCG capsule  Commonly known as:  AMITIZA  Take 1 capsule (24 mcg total) by mouth 2 (two) times daily with a meal.     metFORMIN 1000 MG tablet  Commonly known as:  GLUCOPHAGE  Take 1 tablet (1,000 mg total) by mouth 2 (two) times daily with a meal.     methadone 10 MG tablet  Commonly known as:  DOLOPHINE  Take One tablet by mouth 4 times daily  to control pain     metoprolol 50 MG tablet  Commonly known as:  LOPRESSOR  TAKE ONE TABLET TWICE DAILY     multivitamin with minerals Tabs tablet  Take 1 tablet by mouth daily.     NOVOFINE 30G X 8 MM Misc  Generic drug:  Insulin Pen Needle  Inject 1 packet into the skin See admin instructions. Use with insulin once daily.     pantoprazole 40 MG tablet  Commonly known as:  PROTONIX  TAKE 1 TABLET TWO TIMES DAILY.     polyethylene glycol packet  Commonly known as:  MIRALAX / GLYCOLAX  Take 17 g by mouth daily as needed for mild constipation or moderate constipation.     pregabalin 100 MG capsule  Commonly known as:  LYRICA  Take one 3 times  daily to help neuropathic pains     torsemide 100 MG tablet  Commonly known as:  DEMADEX  Take 1 tablet (100 mg total) by mouth 2 (two) times daily.     traZODone 100 MG tablet  Commonly known as:  DESYREL  TAKE 3 TABLETS (300MG ) AT BEDTIME        Review of Systems:  Review of Systems  Constitutional: Positive for fatigue. Negative for fever, activity change, appetite change and unexpected weight change.       Morbidly obese  HENT: Positive for rhinorrhea and sore throat. Negative for congestion, ear pain, hearing loss, tinnitus, trouble swallowing and voice change.   Eyes:       Corrective lenses  Respiratory: Positive for shortness of breath. Negative for cough, choking, chest tightness and wheezing.   Cardiovascular: Positive for chest pain and leg swelling. Negative for palpitations.  Gastrointestinal: Negative for nausea, abdominal pain, diarrhea, constipation and abdominal distention.  Endocrine: Negative for cold intolerance, heat intolerance, polydipsia, polyphagia and polyuria.  Genitourinary: Negative for dysuria, urgency, frequency and testicular pain.       Not incontinent  Musculoskeletal: Positive for back pain. Negative for myalgias, arthralgias, gait problem and neck pain.  Skin: Positive for rash.       erythema of the anterior shins. History of intertrigo under the breasts and in the groin area bilaterally.  Blotchy, red facial rash present since Spring 2015. Chronic venous stasis changes of both lower legs.  Allergic/Immunologic: Negative.   Neurological: Positive for light-headedness and numbness (both hands demonstrate findiings compatible with carpal tunnel syndrome). Negative for dizziness, tremors, syncope, speech difficulty, weakness and headaches.       Wears glove on the right hand due to chronic painful median nerve neuropathy.History of painful neuropathy of the feet and lower legs.  Hematological: Negative for adenopathy. Does not bruise/bleed easily.    Psychiatric/Behavioral: Negative for hallucinations, behavioral problems, confusion, sleep disturbance and decreased concentration. The patient is not nervous/anxious.        Mild memory difficulty    Health Maintenance  Topic Date Due  . OPHTHALMOLOGY EXAM  02/20/1961  . HIV Screening  02/20/1966  . FOOT EXAM  07/24/2015  . HEMOGLOBIN A1C  08/12/2015  . INFLUENZA VACCINE  10/15/2015  . COLONOSCOPY  06/13/2024  . TETANUS/TDAP  09/10/2024  . ZOSTAVAX  Completed  . Hepatitis C Screening  Completed    Physical Exam: Filed Vitals:   06/27/15 1043  BP: 142/82  Pulse: 86  Temp: 98.2 F (36.8 C)  TempSrc: Oral  Height: 5\' 9"  (1.753 m)  Weight: 395 lb 12.8 oz (179.534 kg)  SpO2: 92%   Body mass index is 58.42 kg/(m^2). Physical Exam  Constitutional: He is oriented to person, place, and time. He appears well-developed and well-nourished. No distress.  Morbid obesity  HENT:  Right Ear: External ear normal.  Left Ear: External ear normal.  Nose: Nose normal.  Mouth/Throat: Oropharynx is clear and moist. No oropharyngeal exudate.  Mild erythema throat  Eyes: Conjunctivae and EOM are normal. Pupils are equal, round, and reactive to light.  Neck: No JVD present. No tracheal deviation present. No thyromegaly present.  Cardiovascular: Normal rate, regular rhythm, normal heart sounds and intact distal pulses.  Exam reveals no gallop and no friction rub.   No murmur heard. Pulmonary/Chest: No respiratory distress. He has no wheezes. He has no rales. He exhibits no tenderness.  Abdominal: He exhibits no distension and no mass. There is no tenderness.  Musculoskeletal: He exhibits edema. He exhibits no tenderness.  Using cane. Unstable gait.  Lymphadenopathy:    He has no cervical adenopathy.  Neurological: He is alert and oriented to person, place, and time. He has normal reflexes. No cranial  nerve deficit. Coordination normal.  08/15/14 MMSE 27/30 09/06/2014  PNCV showed severe  bilateral median nerve neuropathy consistent with severe carpal tunnel syndrome.  Skin: There is erythema (both shins). No pallor.  Chronic venous stasis changes of both anterior legs.  Psychiatric: He has a normal mood and affect. His behavior is normal. Judgment and thought content normal.    Labs reviewed: Basic Metabolic Panel:  Recent Labs  08/29/14 1429 10/11/14 0833 02/12/15 1424  NA 137 142 139  K 4.8 3.8 4.5  CL 91* 95* 98  CO2 26 28 28   GLUCOSE 168* 210* 145*  BUN 26 18 15   CREATININE 1.16 1.05 0.77  CALCIUM 9.5 9.8 9.6   Liver Function Tests:  Recent Labs  10/11/14 0833  AST 26  ALT 22  ALKPHOS 69  BILITOT 0.4  PROT 6.7  ALBUMIN 3.8   No results for input(s): LIPASE, AMYLASE in the last 8760 hours. No results for input(s): AMMONIA in the last 8760 hours. CBC:  Recent Labs  08/10/14 1952 10/11/14 0833 02/12/15 1424  WBC 6.9 8.3 6.4  NEUTROABS  --  5.3 3.8  HGB 11.1*  --   --   HCT 35.8* 37.5 37.9  MCV 87.1 89 84  PLT 234  --   --    Lipid Panel:  Recent Labs  02/12/15 1424  CHOL 223*  HDL 38*  LDLCALC 116*  TRIG 347*  CHOLHDL 5.9*   Lab Results  Component Value Date   HGBA1C 8.4* 02/12/2015    Procedures since last visit: No results found.  Assessment/Plan Edema Chronic, controlled on Torsemide 100mg  bid, last Na 139, K 4.5, Bun 15, creat 0.77  Acute upper respiratory infection Watery eyes, running nose, sore throat, denied chest pain, SOB, or sputum production, Z-pk, OTC mucinex.   Anemia Last Hgb 12.2 02/12/16  HTN (hypertension) Controlled, continue Amlodipine 10mg , Lisinopril 20mg , Metoprolol 50mg  bid, Torsemide 100mg  bid.   Depression, major ([place]) Mood is stable, continue Cymbalta 60mg  bid, Trazodone 300mg  daily.   DM type 2, uncontrolled, with neuropathy Last Hgb a1c 8.4 02/12/16, continue Lantus, Metformin 1000mg  bid, Lyrica 100mg  tid  Chronic pain syndrome Manage pain with Methadone 10mg  qid.    Hypercholesterolemia 02/12/16 LDL 116, continue Atorvastatin 40mg .      Labs/tests ordered:  @ORDERS @ Next appt:  07/23/2015

## 2015-06-27 NOTE — Assessment & Plan Note (Signed)
Last Hgb 12.2 02/12/16

## 2015-06-27 NOTE — Assessment & Plan Note (Signed)
Last Hgb a1c 8.4 02/12/16, continue Lantus, Metformin 1000mg  bid, Lyrica 100mg  tid

## 2015-06-27 NOTE — Assessment & Plan Note (Signed)
Manage pain with Methadone 10mg  qid.

## 2015-06-27 NOTE — Assessment & Plan Note (Signed)
Watery eyes, running nose, sore throat, denied chest pain, SOB, or sputum production, Z-pk, OTC mucinex.

## 2015-06-27 NOTE — Assessment & Plan Note (Signed)
02/12/16 LDL 116, continue Atorvastatin 40mg .

## 2015-06-27 NOTE — Assessment & Plan Note (Signed)
Mood is stable, continue Cymbalta 60mg  bid, Trazodone 300mg  daily.

## 2015-06-27 NOTE — Assessment & Plan Note (Signed)
Controlled, continue Amlodipine 10mg , Lisinopril 20mg , Metoprolol 50mg  bid, Torsemide 100mg  bid.

## 2015-06-27 NOTE — Assessment & Plan Note (Signed)
Chronic, controlled on Torsemide 100mg  bid, last Na 139, K 4.5, Bun 15, creat 0.77

## 2015-06-29 ENCOUNTER — Other Ambulatory Visit: Payer: Self-pay | Admitting: Internal Medicine

## 2015-07-03 ENCOUNTER — Other Ambulatory Visit: Payer: Self-pay | Admitting: *Deleted

## 2015-07-09 ENCOUNTER — Ambulatory Visit: Payer: Medicare Other | Admitting: Internal Medicine

## 2015-07-15 ENCOUNTER — Other Ambulatory Visit: Payer: Self-pay | Admitting: *Deleted

## 2015-07-15 DIAGNOSIS — G5610 Other lesions of median nerve, unspecified upper limb: Secondary | ICD-10-CM

## 2015-07-15 MED ORDER — METHADONE HCL 10 MG PO TABS: ORAL_TABLET | ORAL | Status: DC

## 2015-07-15 NOTE — Telephone Encounter (Signed)
Patient requested and will pick up 

## 2015-07-23 ENCOUNTER — Encounter: Payer: Self-pay | Admitting: Internal Medicine

## 2015-07-23 ENCOUNTER — Ambulatory Visit (INDEPENDENT_AMBULATORY_CARE_PROVIDER_SITE_OTHER): Payer: Medicare Other | Admitting: Internal Medicine

## 2015-07-23 ENCOUNTER — Ambulatory Visit: Payer: Medicare Other | Admitting: Internal Medicine

## 2015-07-23 VITALS — BP 140/84 | HR 118 | Temp 98.4°F | Ht 69.0 in | Wt 397.0 lb

## 2015-07-23 DIAGNOSIS — G5610 Other lesions of median nerve, unspecified upper limb: Secondary | ICD-10-CM

## 2015-07-23 DIAGNOSIS — M25572 Pain in left ankle and joints of left foot: Secondary | ICD-10-CM | POA: Diagnosis not present

## 2015-07-23 DIAGNOSIS — M25579 Pain in unspecified ankle and joints of unspecified foot: Secondary | ICD-10-CM | POA: Insufficient documentation

## 2015-07-23 MED ORDER — TRIAMCINOLONE ACETONIDE 0.1 % EX CREA: TOPICAL_CREAM | CUTANEOUS | Status: DC

## 2015-07-23 MED ORDER — NAPROXEN SODIUM 220 MG PO TABS: ORAL_TABLET | ORAL | Status: DC

## 2015-07-23 MED ORDER — NYSTATIN 100000 UNIT/GM EX CREA: TOPICAL_CREAM | CUTANEOUS | Status: DC

## 2015-07-23 MED ORDER — PREGABALIN 150 MG PO CAPS: ORAL_CAPSULE | ORAL | Status: DC

## 2015-07-23 NOTE — Patient Instructions (Signed)
TAdd 2 Aleve twice daily to help pain.

## 2015-07-23 NOTE — Progress Notes (Signed)
Patient ID: Phillip Maldonado, male   DOB: 04-Feb-1951, 65 y.o.   MRN: [id number]    Facility  [facility]    Place of Service:   OFFICE    Allergies  Allergen Reactions  . Adhesive [Tape] Other (See Comments)    Unknown paper tape ok to use   . Feldene [Piroxicam] Other (See Comments)    unknown  . Latex Other (See Comments)    Rash and itching    Chief Complaint  Patient presents with  . Acute Visit    unable to put pressure on left ankle off and on for awhile. Real bad past few days.    HPI:  Pain in the left ankle. Feels like something is broken.  Started off and on for the last 3 weeks, but became intense this morning.. No history of injury or fall. No history of gout. Can hardly walk. Doubled his pain medication (methadone and Lyrica) for relief.   Medications: Patient's Medications  New Prescriptions   No medications on file  Previous Medications   ACETAMINOPHEN (TYLENOL) 650 MG CR TABLET    Take 1,950 mg by mouth 2 (two) times daily.   AMLODIPINE (NORVASC) 10 MG TABLET    Take 10 mg by mouth daily.   ATORVASTATIN (LIPITOR) 40 MG TABLET    take 1 tablet once daily   DULOXETINE (CYMBALTA) 60 MG CAPSULE    One twice daily to help neuropathy   GLUCOSE BLOOD (ACCU-CHEK AVIVA PLUS) TEST STRIP    1 each by Other route See admin instructions. Check blood sugar twice daily   INSULIN PEN NEEDLE (NOVOFINE) 30G X 8 MM MISC    Inject 1 packet into the skin See admin instructions. Use with insulin once daily.   LANTUS SOLOSTAR 100 UNIT/ML SOLOSTAR PEN    inject 22-24 UNITS EVERY MORNING   LISINOPRIL (PRINIVIL,ZESTRIL) 20 MG TABLET    TAKE ONE TABLET DAILY to control blood pressure and strengthen the heart   LUBIPROSTONE (AMITIZA) 24 MCG CAPSULE    Take 1 capsule (24 mcg total) by mouth 2 (two) times daily with a meal.   LYRICA 50 MG CAPSULE    TAKE ONE CAPSULE THREE TIMES DAILY AS NEEDED FOR PAIN   METFORMIN (GLUCOPHAGE) 1000 MG TABLET    Take 1 tablet (1,000 mg total) by mouth 2 (two)  times daily with a meal.   METHADONE (DOLOPHINE) 10 MG TABLET    Take One tablet by mouth 4 times daily  to control pain   METOPROLOL (LOPRESSOR) 50 MG TABLET    TAKE ONE TABLET TWICE DAILY   MULTIPLE VITAMIN (MULITIVITAMIN WITH MINERALS) TABS    Take 1 tablet by mouth daily.   PANTOPRAZOLE (PROTONIX) 40 MG TABLET    TAKE 1 TABLET TWO TIMES DAILY.   POLYETHYLENE GLYCOL (MIRALAX / GLYCOLAX) PACKET    Take 17 g by mouth daily as needed for mild constipation or moderate constipation.   PREGABALIN (LYRICA) 100 MG CAPSULE    Take one 3 times daily to help neuropathic pains   TORSEMIDE (DEMADEX) 100 MG TABLET    Take 1 tablet (100 mg total) by mouth 2 (two) times daily.   TRAZODONE (DESYREL) 100 MG TABLET    TAKE 3 TABLETS (300MG) AT BEDTIME  Modified Medications   No medications on file  Discontinued Medications   AZITHROMYCIN (ZITHROMAX Z-PAK) 250 MG TABLET    Take 1 tablet (250 mg total) by mouth daily.    Review of Systems  Constitutional:  Positive for activity change and fatigue. Negative for fever, appetite change and unexpected weight change.       Morbidly obese  HENT: Negative for congestion, ear pain, hearing loss, rhinorrhea, sore throat, tinnitus, trouble swallowing and voice change.   Eyes:       Corrective lenses  Respiratory: Positive for shortness of breath. Negative for cough, choking, chest tightness and wheezing.   Cardiovascular: Positive for chest pain and leg swelling. Negative for palpitations.  Gastrointestinal: Negative for nausea, abdominal pain, diarrhea, constipation and abdominal distention.  Endocrine: Negative for cold intolerance, heat intolerance, polydipsia, polyphagia and polyuria.  Genitourinary: Negative for dysuria, urgency, frequency and testicular pain.       Not incontinent  Musculoskeletal: Positive for back pain. Negative for myalgias, arthralgias, gait problem and neck pain.  Skin: Positive for rash.       erythema of the anterior shins. History of  intertrigo under the breasts and in the groin area bilaterally.  Blotchy, red facial rash present since Spring 2015. Chronic venous stasis changes of both lower legs.  Allergic/Immunologic: Negative.   Neurological: Positive for light-headedness and numbness (both hands demonstrate findiings compatible with carpal tunnel syndrome). Negative for dizziness, tremors, syncope, speech difficulty, weakness and headaches.       Wears glove on the right hand due to chronic painful median nerve neuropathy.History of painful neuropathy of the feet and lower legs.  Hematological: Negative for adenopathy. Does not bruise/bleed easily.  Psychiatric/Behavioral: Negative for hallucinations, behavioral problems, confusion, sleep disturbance and decreased concentration. The patient is not nervous/anxious.        Mild memory difficulty    Filed Vitals:   07/23/15 1657  BP: 140/84  Pulse: 118  Temp: 98.4 F (36.9 C)  TempSrc: Oral  Height: '5\' 9"'  (1.753 m)  Weight: 397 lb (180.078 kg)  SpO2: 95%   Body mass index is 58.6 kg/(m^2). Filed Weights   07/23/15 1657  Weight: 397 lb (180.078 kg)     Physical Exam  Constitutional: He is oriented to person, place, and time. He appears well-developed and well-nourished. No distress.  Morbid obesity  HENT:  Right Ear: External ear normal.  Left Ear: External ear normal.  Nose: Nose normal.  Mouth/Throat: Oropharynx is clear and moist. No oropharyngeal exudate.  Eyes: Conjunctivae and EOM are normal. Pupils are equal, round, and reactive to light.  Neck: No JVD present. No tracheal deviation present. No thyromegaly present.  Cardiovascular: Normal rate, regular rhythm, normal heart sounds and intact distal pulses.  Exam reveals no gallop and no friction rub.   No murmur heard. Pulmonary/Chest: No respiratory distress. He has no wheezes. He has no rales. He exhibits no tenderness.  Abdominal: He exhibits no distension and no mass. There is no tenderness.    Musculoskeletal: He exhibits edema. He exhibits no tenderness.  Using cane. Unstable gait.  Lymphadenopathy:    He has no cervical adenopathy.  Neurological: He is alert and oriented to person, place, and time. He has normal reflexes. No cranial nerve deficit. Coordination normal.  08/15/14 MMSE 27/30 09/06/2014  PNCV showed severe bilateral median nerve neuropathy consistent with severe carpal tunnel syndrome.  Skin: There is erythema (both shins). No pallor.  Chronic venous stasis changes of both anterior legs.  Psychiatric: He has a normal mood and affect. His behavior is normal. Judgment and thought content normal.    Labs reviewed: Lab Summary Latest Ref Rng 02/12/2015  Hemoglobin 12.6 - 17.7 g/dL 12.3(L)  Hematocrit 37.5 - 51.0 %  37.9  White count 3.4 - 10.8 x10E3/uL 6.4  Platelet count - (None)  Sodium 136 - 144 mmol/L 139  Potassium 3.5 - 5.2 mmol/L 4.5  Calcium 8.6 - 10.2 mg/dL 9.6  Phosphorus - (None)  Creatinine 0.76 - 1.27 mg/dL 0.77  AST - (None)  Alk Phos - (None)  Bilirubin - (None)  Glucose 65 - 99 mg/dL 145(H)  Cholesterol - (None)  HDL cholesterol >39 mg/dL 38(L)  Triglycerides 0 - 149 mg/dL 347(H)  LDL Direct - (None)  LDL Calc 0 - 99 mg/dL 116(H)  Total protein - (None)  Albumin - (None)   Lab Results  Component Value Date   TSH 2.530 03/14/2014   TSH 1.470 12/26/2013   TSH 4.000 09/16/2013   Lab Results  Component Value Date   BUN 15 02/12/2015   BUN 18 10/11/2014   BUN 26 08/29/2014   Lab Results  Component Value Date   HGBA1C 8.4* 02/12/2015   HGBA1C 8.5* 10/11/2014   HGBA1C 9.3* 07/24/2014    Assessment/Plan  1. Pain in joint, ankle and foot, left Continue Methadone - naproxen sodium (ALEVE) 220 MG tablet; Take two twice daily to help pain  Dispense: 60 tablet; Refill: 5 - DG Ankle 2 Views Left; Future  2. Neuropathy, median nerve, unspecified laterality Increased dose - pregabalin (LYRICA) 150 MG capsule; Take one capsule 3 times  daily to help neuropathic pains  Dispense: 90 capsule; Refill: 5

## 2015-07-24 ENCOUNTER — Ambulatory Visit
Admission: RE | Admit: 2015-07-24 | Discharge: 2015-07-24 | Disposition: A | Discharge status: Home / Self Care | Payer: Medicare Other | Source: Ambulatory Visit | Attending: Internal Medicine | Admitting: Internal Medicine

## 2015-07-24 ENCOUNTER — Telehealth: Payer: Self-pay

## 2015-07-24 ENCOUNTER — Ambulatory Visit: Payer: Medicare Other | Admitting: Internal Medicine

## 2015-07-24 DIAGNOSIS — M7989 Other specified soft tissue disorders: Secondary | ICD-10-CM | POA: Diagnosis not present

## 2015-07-24 DIAGNOSIS — M25572 Pain in left ankle and joints of left foot: Secondary | ICD-10-CM

## 2015-07-24 NOTE — Telephone Encounter (Signed)
Patient called requesting xray results, please advise

## 2015-07-25 ENCOUNTER — Other Ambulatory Visit: Payer: Self-pay | Admitting: Internal Medicine

## 2015-07-25 NOTE — Telephone Encounter (Signed)
Please advise 

## 2015-07-26 NOTE — Telephone Encounter (Signed)
Patient states its giving him a fit and is swelling. Prefers Orthopedic Referral. Patient states he will use lace up boot.  Referral placed.

## 2015-07-26 NOTE — Telephone Encounter (Signed)
Small avulsion fractures posterior to the talus are not excluded. There is soft tissue swelling. Chronic changes are noted.  If he has a lace up boot, I would suggest wearing that for support. We can refer to ortho if he desires.

## 2015-07-30 ENCOUNTER — Encounter: Payer: Self-pay | Admitting: Internal Medicine

## 2015-07-30 ENCOUNTER — Emergency Department (HOSPITAL_COMMUNITY)
Admission: EM | Admit: 2015-07-30 | Discharge: 2015-07-30 | Disposition: A | Discharge status: Home / Self Care | Payer: Medicare Other | Attending: Emergency Medicine | Admitting: Emergency Medicine

## 2015-07-30 ENCOUNTER — Emergency Department (HOSPITAL_COMMUNITY): Payer: Medicare Other

## 2015-07-30 ENCOUNTER — Ambulatory Visit (INDEPENDENT_AMBULATORY_CARE_PROVIDER_SITE_OTHER): Payer: Medicare Other | Admitting: Internal Medicine

## 2015-07-30 ENCOUNTER — Encounter (HOSPITAL_COMMUNITY): Payer: Self-pay | Admitting: Emergency Medicine

## 2015-07-30 VITALS — BP 146/72 | HR 87 | Temp 98.2°F | Resp 20 | Ht 69.0 in | Wt 394.8 lb

## 2015-07-30 DIAGNOSIS — N289 Disorder of kidney and ureter, unspecified: Secondary | ICD-10-CM

## 2015-07-30 DIAGNOSIS — S80211A Abrasion, right knee, initial encounter: Secondary | ICD-10-CM | POA: Diagnosis not present

## 2015-07-30 DIAGNOSIS — Z862 Personal history of diseases of the blood and blood-forming organs and certain disorders involving the immune mechanism: Secondary | ICD-10-CM | POA: Diagnosis not present

## 2015-07-30 DIAGNOSIS — I10 Essential (primary) hypertension: Secondary | ICD-10-CM | POA: Insufficient documentation

## 2015-07-30 DIAGNOSIS — Z794 Long term (current) use of insulin: Secondary | ICD-10-CM | POA: Diagnosis not present

## 2015-07-30 DIAGNOSIS — I4891 Unspecified atrial fibrillation: Secondary | ICD-10-CM | POA: Diagnosis not present

## 2015-07-30 DIAGNOSIS — S99911A Unspecified injury of right ankle, initial encounter: Secondary | ICD-10-CM | POA: Insufficient documentation

## 2015-07-30 DIAGNOSIS — Y998 Other external cause status: Secondary | ICD-10-CM | POA: Diagnosis not present

## 2015-07-30 DIAGNOSIS — E1165 Type 2 diabetes mellitus with hyperglycemia: Secondary | ICD-10-CM | POA: Diagnosis not present

## 2015-07-30 DIAGNOSIS — F329 Major depressive disorder, single episode, unspecified: Secondary | ICD-10-CM | POA: Insufficient documentation

## 2015-07-30 DIAGNOSIS — F419 Anxiety disorder, unspecified: Secondary | ICD-10-CM | POA: Diagnosis not present

## 2015-07-30 DIAGNOSIS — Z8701 Personal history of pneumonia (recurrent): Secondary | ICD-10-CM | POA: Diagnosis not present

## 2015-07-30 DIAGNOSIS — Y92002 Bathroom of unspecified non-institutional (private) residence single-family (private) house as the place of occurrence of the external cause: Secondary | ICD-10-CM | POA: Insufficient documentation

## 2015-07-30 DIAGNOSIS — M1990 Unspecified osteoarthritis, unspecified site: Secondary | ICD-10-CM | POA: Diagnosis not present

## 2015-07-30 DIAGNOSIS — Z8719 Personal history of other diseases of the digestive system: Secondary | ICD-10-CM | POA: Insufficient documentation

## 2015-07-30 DIAGNOSIS — R55 Syncope and collapse: Secondary | ICD-10-CM | POA: Diagnosis not present

## 2015-07-30 DIAGNOSIS — G709 Myoneural disorder, unspecified: Secondary | ICD-10-CM | POA: Insufficient documentation

## 2015-07-30 DIAGNOSIS — I209 Angina pectoris, unspecified: Secondary | ICD-10-CM | POA: Diagnosis not present

## 2015-07-30 DIAGNOSIS — W19XXXA Unspecified fall, initial encounter: Secondary | ICD-10-CM

## 2015-07-30 DIAGNOSIS — E1140 Type 2 diabetes mellitus with diabetic neuropathy, unspecified: Secondary | ICD-10-CM | POA: Diagnosis not present

## 2015-07-30 DIAGNOSIS — Z7984 Long term (current) use of oral hypoglycemic drugs: Secondary | ICD-10-CM | POA: Insufficient documentation

## 2015-07-30 DIAGNOSIS — Y9389 Activity, other specified: Secondary | ICD-10-CM | POA: Diagnosis not present

## 2015-07-30 DIAGNOSIS — Z79899 Other long term (current) drug therapy: Secondary | ICD-10-CM | POA: Insufficient documentation

## 2015-07-30 DIAGNOSIS — R42 Dizziness and giddiness: Secondary | ICD-10-CM | POA: Diagnosis not present

## 2015-07-30 DIAGNOSIS — M25572 Pain in left ankle and joints of left foot: Secondary | ICD-10-CM | POA: Diagnosis not present

## 2015-07-30 DIAGNOSIS — Z8619 Personal history of other infectious and parasitic diseases: Secondary | ICD-10-CM | POA: Insufficient documentation

## 2015-07-30 DIAGNOSIS — Z91040 Latex allergy status: Secondary | ICD-10-CM | POA: Diagnosis not present

## 2015-07-30 DIAGNOSIS — IMO0002 Reserved for concepts with insufficient information to code with codable children: Secondary | ICD-10-CM

## 2015-07-30 DIAGNOSIS — W01198A Fall on same level from slipping, tripping and stumbling with subsequent striking against other object, initial encounter: Secondary | ICD-10-CM | POA: Diagnosis not present

## 2015-07-30 DIAGNOSIS — E66813 Obesity, class 3: Secondary | ICD-10-CM | POA: Insufficient documentation

## 2015-07-30 DIAGNOSIS — S299XXA Unspecified injury of thorax, initial encounter: Secondary | ICD-10-CM | POA: Diagnosis not present

## 2015-07-30 DIAGNOSIS — M25561 Pain in right knee: Secondary | ICD-10-CM | POA: Diagnosis not present

## 2015-07-30 DIAGNOSIS — G609 Hereditary and idiopathic neuropathy, unspecified: Secondary | ICD-10-CM | POA: Diagnosis not present

## 2015-07-30 DIAGNOSIS — R404 Transient alteration of awareness: Secondary | ICD-10-CM | POA: Diagnosis not present

## 2015-07-30 DIAGNOSIS — S8991XA Unspecified injury of right lower leg, initial encounter: Secondary | ICD-10-CM | POA: Diagnosis not present

## 2015-07-30 HISTORY — DX: Morbid (severe) obesity due to excess calories: E66.01

## 2015-07-30 LAB — COMPREHENSIVE METABOLIC PANEL
ALBUMIN: 3.4 g/dL — AB (ref 3.5–5.0)
ALK PHOS: 57 U/L (ref 38–126)
ALT: 28 U/L (ref 17–63)
ANION GAP: 12 (ref 5–15)
AST: 45 U/L — ABNORMAL HIGH (ref 15–41)
BUN: 26 mg/dL — ABNORMAL HIGH (ref 6–20)
CALCIUM: 9.6 mg/dL (ref 8.9–10.3)
CHLORIDE: 97 mmol/L — AB (ref 101–111)
CO2: 31 mmol/L (ref 22–32)
Creatinine, Ser: 1.26 mg/dL — ABNORMAL HIGH (ref 0.61–1.24)
GFR calc non Af Amer: 59 mL/min — ABNORMAL LOW (ref >60)
GLUCOSE: 156 mg/dL — AB (ref 65–99)
POTASSIUM: 4.4 mmol/L (ref 3.5–5.1)
SODIUM: 140 mmol/L (ref 135–145)
Total Bilirubin: 0.4 mg/dL (ref 0.3–1.2)
Total Protein: 7.2 g/dL (ref 6.5–8.1)

## 2015-07-30 LAB — CBC WITH DIFFERENTIAL/PLATELET
BASOS PCT: 0 %
Basophils Absolute: 0 10*3/uL (ref 0.0–0.1)
Eosinophils Absolute: 0.2 10*3/uL (ref 0.0–0.7)
Eosinophils Relative: 3 %
HEMATOCRIT: 40.4 % (ref 39.0–52.0)
HEMOGLOBIN: 12.5 g/dL — AB (ref 13.0–17.0)
LYMPHS ABS: 2.3 10*3/uL (ref 0.7–4.0)
LYMPHS PCT: 29 %
MCH: 27.8 pg (ref 26.0–34.0)
MCHC: 30.9 g/dL (ref 30.0–36.0)
MCV: 89.8 fL (ref 78.0–100.0)
MONO ABS: 0.5 10*3/uL (ref 0.1–1.0)
MONOS PCT: 6 %
NEUTROS ABS: 5 10*3/uL (ref 1.7–7.7)
NEUTROS PCT: 62 %
Platelets: 193 10*3/uL (ref 150–400)
RBC: 4.5 MIL/uL (ref 4.22–5.81)
RDW: 15 % (ref 11.5–15.5)
WBC: 8.1 10*3/uL (ref 4.0–10.5)

## 2015-07-30 LAB — I-STAT TROPONIN, ED: Troponin i, poc: 0 ng/mL (ref 0.00–0.08)

## 2015-07-30 MED ORDER — HYDROCODONE-ACETAMINOPHEN 5-325 MG PO TABS
1.0000 | ORAL_TABLET | Freq: Once | ORAL | Status: AC
  Administered 2015-07-30: 1 via ORAL
  Filled 2015-07-30: qty 1

## 2015-07-30 MED ORDER — METOPROLOL TARTRATE 50 MG PO TABS: ORAL_TABLET | ORAL | Status: DC

## 2015-07-30 NOTE — ED Provider Notes (Signed)
CSN: [id number]     Arrival date & time 07/30/15  0749 History   First MD Initiated Contact with Patient 07/30/15 [id number]     Chief Complaint  Patient presents with  . Dizziness    HPI   Phillip Maldonado is an 65 y.o. male with history of CHF, afib (not on anti-coag), anemia of chronic dz, DM, HTN, Hep B, chronic pain, who presents to the ED for evaluation of syncope/fall. He states he typically sleeps at night in his recliner. States earlier this morning he woke up to use the restroom, walked to the restroom at baseline, and thinks he fell asleep on the toilet. States that he woke up and went to stand up and the next thing he remembers is feeling lightheaded and then hitting his forehead on the bathtub. He thinks he might have passed out for "a split second." Denies blurry vision, headache, nausea, vomiting. States that currently most of his pain is in his right knee, shin, and ankle from twisting as he fell. He states he walks with a cane at baseline and has not tried ambulating since this incident. He denies chest pain, SOB, or new swelling. States that he has been feeling lightheaded with standing for the past few months but his PCP told him it could be due to his many medications. He states that he does spend much of his time sitting/immobile. Denies edema increased from baseline. Denies new numbness, weakness, or tingling.   Past Medical History  Diagnosis Date  . CHF (congestive heart failure) ([place])   . Hypertension   . Anemia   . Angina   . Shortness of breath   . Anxiety   . Blood transfusion     last 10' 14- "GI bleed"  . Headache(784.0)   . Depression   . Hepatitis B   . Neuromuscular disorder ([place])   . Arthritis   . Diabetes mellitus ([place]) 09/18/2011  . Atrial fibrillation with RVR ([place]) 01/24/2013  . Renal insufficiency   . DJD (degenerative joint disease)   . History of alcohol abuse     last drink in 1993  . Obesity   . Diverticulosis   . Fatty liver   . Sleep apnea,  obstructive     does where bipap. 06-04-14 "doesn't use much now, no mask/tubing now.  . Pneumonia     not at present time  . Edema extremities     bilateral lower extremities-"weepy areas due to fluid retention"  . Fundic gland polyps of stomach, benign   . DM type 2, uncontrolled, with neuropathy ([place]) 12/26/2013  . Neuropathy, median nerve 09/11/2014    Bilateral   Past Surgical History  Procedure Laterality Date  . Gastric bypass  1977    Reversed in 1992 and revision 1994  . Diagnostic laparoscopy    . Esophagoscopy N/A 01/01/2013    Procedure: ESOPHAGOSCOPY;  Surgeon: Jeryl Columbia, MD;  Location: [facility];  Service: Endoscopy;  Laterality: N/A;  . Esophagogastroduodenoscopy N/A 01/03/2013    Procedure: ESOPHAGOGASTRODUODENOSCOPY (EGD);  Surgeon: Arta Silence, MD;  Location: [facility];  Service: Endoscopy;  Laterality: N/A;  . Esophagogastroduodenoscopy N/A 01/03/2013    Procedure: ESOPHAGOGASTRODUODENOSCOPY (EGD);  Surgeon: Arta Silence, MD;  Location: [facility];  Service: Endoscopy;  Laterality: N/A;  . Esophagogastroduodenoscopy N/A 01/23/2013    Procedure: ESOPHAGOGASTRODUODENOSCOPY (EGD);  Surgeon: Lear Ng, MD;  Location: [facility];  Service: Endoscopy;  Laterality: N/A;  . Esophagogastroduodenoscopy Left 01/27/2013    Procedure: ESOPHAGOGASTRODUODENOSCOPY (  EGD);  Surgeon: Lear Ng, MD;  Location: [facility];  Service: Endoscopy;  Laterality: Left;  . Knee surgery Left 2004  . Hydrocele excision  1996    x 2   . Abdominal surgery      [phone]  . Colonoscopy    . Back surgery      x 8 -,multiple fusions(cervical to lumbar)  . Colonoscopy with propofol N/A 06/14/2014    Procedure: COLONOSCOPY WITH PROPOFOL;  Surgeon: Gatha Mayer, MD;  Location: [facility];  Service: Endoscopy;  Laterality: N/A;  . Esophagogastroduodenoscopy N/A 06/14/2014    Procedure: ESOPHAGOGASTRODUODENOSCOPY (EGD);  Surgeon: Gatha Mayer, MD;  Location: [facility];   Service: Endoscopy;  Laterality: N/A;   Family History  Problem Relation Age of Onset  . Heart disease Father   . Skin cancer Father   . COPD Father   . Hypertension Father   . Heart disease Maternal Uncle   . High blood pressure Sister   . High blood pressure Sister   . High blood pressure Son   . Diabetes Daughter   . Depression Son   . Depression Son   . Stroke Father   . Arthritis Mother   . Arthritis Sister   . Arthritis Sister   . Osteoporosis Mother   . Cancer Maternal Grandmother   . Heart disease Maternal Uncle   . Heart disease Paternal Uncle    Social History  Substance Use Topics  . Smoking status: Never Smoker   . Smokeless tobacco: Never Used  . Alcohol Use: No    Review of Systems  All other systems reviewed and are negative.     Allergies  Adhesive; Feldene; and Latex  Home Medications   Prior to Admission medications   Medication Sig Start Date End Date Taking? Authorizing Provider  acetaminophen (TYLENOL) 650 MG CR tablet Take 1,950 mg by mouth 2 (two) times daily.    Historical Provider, MD  amLODipine (NORVASC) 10 MG tablet Take 10 mg by mouth daily. 07/28/14   Historical Provider, MD  atorvastatin (LIPITOR) 40 MG tablet take 1 tablet once daily 04/16/14   Estill Dooms, MD  DULoxetine (CYMBALTA) 60 MG capsule One twice daily to help neuropathy 04/24/15   Estill Dooms, MD  glucose blood (ACCU-CHEK AVIVA PLUS) test strip 1 each by Other route See admin instructions. Check blood sugar twice daily    Historical Provider, MD  Insulin Pen Needle (NOVOFINE) 30G X 8 MM MISC Inject 1 packet into the skin See admin instructions. Use with insulin once daily.    Historical Provider, MD  LANTUS SOLOSTAR 100 UNIT/ML Solostar Pen inject 22-24 UNITS EVERY MORNING 06/04/15   Estill Dooms, MD  lisinopril (PRINIVIL,ZESTRIL) 20 MG tablet TAKE ONE TABLET DAILY to control blood pressure and strengthen the heart 03/21/15   Estill Dooms, MD  lubiprostone (AMITIZA) 24  MCG capsule Take 1 capsule (24 mcg total) by mouth 2 (two) times daily with a meal. 04/26/14   Gatha Mayer, MD  metFORMIN (GLUCOPHAGE) 1000 MG tablet Take 1 tablet (1,000 mg total) by mouth 2 (two) times daily with a meal. 12/18/14   Estill Dooms, MD  methadone (DOLOPHINE) 10 MG tablet Take One tablet by mouth 4 times daily  to control pain 07/15/15   Tiffany L Reed, DO  metoprolol (LOPRESSOR) 50 MG tablet TAKE ONE TABLET TWICE DAILY 05/01/15   Estill Dooms, MD  Multiple Vitamin (MULITIVITAMIN WITH MINERALS) TABS Take 1  tablet by mouth daily.    Historical Provider, MD  naproxen sodium (ALEVE) 220 MG tablet Take two twice daily to help pain 07/23/15   Estill Dooms, MD  nystatin cream (MYCOSTATIN) Apply twice daily to rash 07/23/15   Estill Dooms, MD  pantoprazole (PROTONIX) 40 MG tablet TAKE 1 TABLET TWO TIMES DAILY. 02/25/15   Estill Dooms, MD  polyethylene glycol [facility] Floria Raveling) packet Take 17 g by mouth daily as needed for mild constipation or moderate constipation.    Historical Provider, MD  pregabalin (LYRICA) 150 MG capsule Take one capsule 3 times daily to help neuropathic pains 07/23/15   Estill Dooms, MD  torsemide (DEMADEX) 100 MG tablet Take 1 tablet (100 mg total) by mouth 2 (two) times daily. 05/22/15   Estill Dooms, MD  traZODone (DESYREL) 100 MG tablet TAKE 3 TABLETS (300MG ) AT BEDTIME 07/25/15   Estill Dooms, MD  triamcinolone cream (KENALOG) 0.1 % Apply sparingly up to twice daily to rash until it is resolved 07/23/15   Estill Dooms, MD   Temp(Src) 98.3 F (36.8 C) (Oral) Physical Exam  Constitutional: He is oriented to person, place, and time.  Obese, chronically ill appearing, NAD  HENT:  Head: Atraumatic.  Right Ear: External ear normal.  Left Ear: External ear normal.  Nose: Nose normal.  Mouth/Throat: Oropharynx is clear and moist. No oropharyngeal exudate.  No facial tenderness or crepitus.  Eyes: Conjunctivae and EOM are normal. Pupils are equal, round,  and reactive to light.  Neck: Normal range of motion. Neck supple.  No C-spine tenderness  Cardiovascular: Normal rate, regular rhythm, normal heart sounds and intact distal pulses.   Pulmonary/Chest: Effort normal and breath sounds normal. No respiratory distress. He has no wheezes. He exhibits no tenderness.  Abdominal: Soft. Bowel sounds are normal. He exhibits no distension. There is no tenderness.  Musculoskeletal: He exhibits no edema.  Diffuse right knee tenderness. FROM. No edema or laxity. Superficial abrasion inferior to right knee  Diffuse right shin tenderness. Chronic discoloration.  Diffuse right ankle tenderness. Edema at baseline. No discoloration.  1-2+ pitting edema bilateral LE  Neurological: He is alert and oriented to person, place, and time. No cranial nerve deficit.  Normal finger to nose No pronator drift 5/5 strength in bilateral UE and LE  Skin: Skin is warm and dry.  Psychiatric: He has a normal mood and affect.  Nursing note and vitals reviewed.  Orthostatic VS for the past 24 hrs:  BP- Lying Pulse- Lying BP- Sitting Pulse- Sitting BP- Standing at 0 minutes Pulse- Standing at 0 minutes  07/30/15 0939 125/62 mmHg 79 126/75 mmHg 83 118/64 mmHg 86      ED Course  Procedures (including critical care time) Labs Review Labs Reviewed  COMPREHENSIVE METABOLIC PANEL - Abnormal; Notable for the following:    Chloride 97 (*)    Glucose, Bld 156 (*)    BUN 26 (*)    Creatinine, Ser 1.26 (*)    Albumin 3.4 (*)    AST 45 (*)    GFR calc non Af Amer 59 (*)    All other components within normal limits  CBC WITH DIFFERENTIAL/PLATELET - Abnormal; Notable for the following:    Hemoglobin 12.5 (*)    All other components within normal limits  I-STAT TROPOININ, ED    Imaging Review Dg Chest 2 View  07/30/2015  CLINICAL DATA:  Syncope, dizziness, fall this morning, abrasion right tibia fibula EXAM: CHEST  2 VIEW COMPARISON:  08/10/2014 FINDINGS:  Cardiomediastinal silhouette is stable. Mild elevation of the right hemidiaphragm again noted. Surgical clips in left upper quadrant of the abdomen. Metallic fixation material noted cervical spine. No acute infiltrate or pleural effusion. No pulmonary edema. Degenerative changes thoracic spine. No gross fractures are noted. There is no pneumothorax. Stable left basilar atelectasis or scarring. IMPRESSION: No active disease. Chronic elevation of the right hemidiaphragm. No infiltrate or pulmonary edema. Stable left basilar atelectasis or scarring Electronically Signed   By: Lahoma Crocker M.D.   On: 07/30/2015 09:19   Dg Tibia/fibula Right  07/30/2015  CLINICAL DATA:  Dizziness, fall this morning, right tibia fibula abrasions EXAM: RIGHT TIBIA AND FIBULA - 2 VIEW COMPARISON:  Right knee same day FINDINGS: Four views of the right tibia fibula submitted. No acute fracture or subluxation. Degenerative changes right knee joint. No radiopaque foreign body. IMPRESSION: No acute fracture or subluxation. Degenerative changes right knee joint. Electronically Signed   By: Lahoma Crocker M.D.   On: 07/30/2015 09:22   Dg Ankle Complete Right  07/30/2015  CLINICAL DATA:  Status post fall this morning; positioning is limited due to the patient's obesity. EXAM: RIGHT ANKLE - COMPLETE 3+ VIEW COMPARISON:  Right tibia and fibula of today's date FINDINGS: The bones appear adequately mineralized. The ankle joint mortise is reasonably well-maintained. The talar dome and the calcaneus are intact. There are plantar and Achilles region calcaneal spurs. No acute fracture nor dislocation is observed. There is moderate diffuse soft tissue swelling. IMPRESSION: There is no acute bony abnormality of the right ankle. Electronically Signed   By: David  Martinique M.D.   On: 07/30/2015 09:20   Dg Knee Complete 4 Views Right  07/30/2015  CLINICAL DATA:  Syncope, dizziness, fall this morning, right tibia fibula abrasions EXAM: RIGHT KNEE - COMPLETE 4+  VIEW COMPARISON:  None. FINDINGS: Six views of the right knee submitted. No acute fracture or subluxation. There is diffuse narrowing of joint space most significant medial compartment. Spurring of medial and lateral tibial plateau. Spurring of femoral condyles. Narrowing of patellofemoral joint space. Spurring of patella. Tiny joint effusion. IMPRESSION: No acute fracture or subluxation. Degenerative changes as described above. Electronically Signed   By: Lahoma Crocker M.D.   On: 07/30/2015 09:21   I have personally reviewed and evaluated these images and lab results as part of my medical decision-making.   EKG Interpretation   Date/Time:  Tuesday Jul 30 2015 07:56:31 EDT Ventricular Rate:  83 PR Interval:  180 QRS Duration: 151 QT Interval:  431 QTC Calculation: 506 R Axis:   -29 Text Interpretation:  Sinus rhythm Right bundle branch block Baseline  wander in lead(s) V3 More complete RBBB compared to previous tracing  Confirmed by NGUYEN, EMILY ([id number]) on 07/30/2015 8:45:42 AM      MDM   Final diagnoses:  Fall, initial encounter  Syncope, unspecified syncope type  Renal insufficiency    Pt presenting with possible syncopal episode and fall after standing up in the bathroom after he fell asleep on the toilet. Exam with no focal neuro deficits. Some diffuse right LE tenderness but x-rays are normal. Labs with mild AKI otherwise at baseline and unrevealing. Cardiac workup negative. CXR at baseline. CT head negative. Pt feels improved and he is ambulatory with steady gait. He feels ready to go home. i think this is reasonable. He does admit to taking his nighttime meds later than usual last night, including his trazadone and Lyrica. This certainly  could have exacerbated his sleepiness and fall predisposition last night, though it does sound somewhat syncopal based on his description. His orthostatics are negative. Instructed close PCP f/u with strict ER return precautions. Pt verbalized his  agreement and understanding.    Anne Ng, PA-C 07/30/15 1230  Harvel Quale, MD 08/11/15 [phone]

## 2015-07-30 NOTE — ED Notes (Signed)
Patient transported to CT 

## 2015-07-30 NOTE — ED Notes (Signed)
Per [facility] patient complains of persistent dizziness that started at 0400 today.  Patient states he got up to walk home when the dizziness started and he fell.  Patient denies loss of consciousness.  Patient states that he has been feeling dizzy intermittently for several weeks.  Patient alert and oriented at this time.

## 2015-07-30 NOTE — ED Notes (Signed)
PA Sam at bedside 

## 2015-07-30 NOTE — Discharge Instructions (Signed)
You were seen in the emergency room today for evaluation of dizziness and a possible syncopal episode/fall.  Your labs and imaging were unrevealing. Your kidney function is a little bit elevated today compared to baseline. Please call your primary care provider to schedule a follow up appointment for this week. In the meantime please remember not to take your evening medications too late at night as it can increase your drowsiness/dizziness. Your primary care provider will also need to keep an eye on your kidney function. Return to the emergency room for new or worsening symptoms.

## 2015-07-30 NOTE — ED Notes (Signed)
Patient transported to X-ray 

## 2015-07-30 NOTE — Progress Notes (Signed)
Patient ID: Phillip Maldonado, male   DOB: 1950-11-26, 65 y.o.   MRN: [id number]    Facility  [facility]    Place of Service:   OFFICE    Allergies  Allergen Reactions  . Adhesive [Tape] Other (See Comments)    Unknown paper tape ok to use   . Feldene [Piroxicam] Other (See Comments)    unknown  . Latex Other (See Comments)    Rash and itching    Chief Complaint  Patient presents with  . Hospitalization Follow-up    fell in the bathroom this morning, skin blister on right knee, bumped head on tub.    HPI:  Patient was seen in the emergency department earlier today following a fall while in his bathroom. Patient thinks he fell asleep on the toilet. When he woke up and would stand up he became lightheaded and then fell forward hitting his head on the bathtub. Although he is not sure, he thinks he may have blacked out. He also abraded his right knee laterally. After thorough evaluation in the emergency room which included a CT scan of the head as well as multiple x-rays of the right knee, right ankle, and right tibia and fibula, he was felt stable and returned to home. He  Pain in the left ankle is improving. He is using walker to get around.  Although leg swelling is prominent, believe it is actually little better as compared to last visit.  Medications: Patient's Medications  New Prescriptions   No medications on file  Previous Medications   ACETAMINOPHEN (TYLENOL) 650 MG CR TABLET    Take 1,950 mg by mouth 2 (two) times daily.   AMLODIPINE (NORVASC) 10 MG TABLET    Take 10 mg by mouth daily.   ATORVASTATIN (LIPITOR) 40 MG TABLET    take 1 tablet once daily   DULOXETINE (CYMBALTA) 60 MG CAPSULE    One twice daily to help neuropathy   GLUCOSE BLOOD (ACCU-CHEK AVIVA PLUS) TEST STRIP    1 each by Other route See admin instructions. Check blood sugar twice daily   INSULIN PEN NEEDLE (NOVOFINE) 30G X 8 MM MISC    Inject 1 packet into the skin See admin instructions. Use with insulin once  daily.   LANTUS SOLOSTAR 100 UNIT/ML SOLOSTAR PEN    inject 22-24 UNITS EVERY MORNING   LISINOPRIL (PRINIVIL,ZESTRIL) 20 MG TABLET    TAKE ONE TABLET DAILY to control blood pressure and strengthen the heart   LUBIPROSTONE (AMITIZA) 24 MCG CAPSULE    Take 1 capsule (24 mcg total) by mouth 2 (two) times daily with a meal.   LYRICA 100 MG CAPSULE    Take 100 mg by mouth 3 (three) times daily. In addition to prn dose   LYRICA 50 MG CAPSULE    Take 50 mg by mouth 3 (three) times daily as needed. Pain - in addition to routine dose   METFORMIN (GLUCOPHAGE) 1000 MG TABLET    Take 1 tablet (1,000 mg total) by mouth 2 (two) times daily with a meal.   METHADONE (DOLOPHINE) 10 MG TABLET    Take One tablet by mouth 4 times daily  to control pain   METOPROLOL (LOPRESSOR) 50 MG TABLET    TAKE ONE TABLET TWICE DAILY   MULTIPLE VITAMIN (MULITIVITAMIN WITH MINERALS) TABS    Take 1 tablet by mouth daily.   NAPROXEN SODIUM (ALEVE) 220 MG TABLET    Take two twice daily to help pain   NYSTATIN  CREAM (MYCOSTATIN)    Apply twice daily to rash   PANTOPRAZOLE (PROTONIX) 40 MG TABLET    TAKE 1 TABLET TWO TIMES DAILY.   POLYETHYLENE GLYCOL (MIRALAX / GLYCOLAX) PACKET    Take 17 g by mouth daily as needed for mild constipation or moderate constipation.   PREGABALIN (LYRICA) 150 MG CAPSULE    Take one capsule 3 times daily to help neuropathic pains   TORSEMIDE (DEMADEX) 100 MG TABLET    Take 1 tablet (100 mg total) by mouth 2 (two) times daily.   TRAZODONE (DESYREL) 100 MG TABLET    TAKE 3 TABLETS (300MG) AT BEDTIME   TRIAMCINOLONE CREAM (KENALOG) 0.1 %    Apply sparingly up to twice daily to rash until it is resolved  Modified Medications   No medications on file  Discontinued Medications   No medications on file    Review of Systems  Constitutional: Positive for activity change and fatigue. Negative for fever, appetite change and unexpected weight change.       Morbidly obese  HENT: Negative for congestion, ear pain,  hearing loss, rhinorrhea, sore throat, tinnitus, trouble swallowing and voice change.   Eyes:       Corrective lenses  Respiratory: Positive for shortness of breath. Negative for cough, choking, chest tightness and wheezing.   Cardiovascular: Positive for chest pain and leg swelling. Negative for palpitations.  Gastrointestinal: Negative for nausea, abdominal pain, diarrhea, constipation and abdominal distention.  Endocrine: Negative for cold intolerance, heat intolerance, polydipsia, polyphagia and polyuria.  Genitourinary: Negative for dysuria, urgency, frequency and testicular pain.       Not incontinent  Musculoskeletal: Positive for back pain. Negative for myalgias, arthralgias, gait problem and neck pain.       Tender left ankle.  Skin: Positive for rash.       erythema of the anterior shins. History of intertrigo under the breasts and in the groin area bilaterally.  Blotchy, red facial rash present since Spring 2015. Chronic venous stasis changes of both lower legs. Abrasion of the right knee laterally.  Allergic/Immunologic: Negative.   Neurological: Positive for light-headedness and numbness (both hands demonstrate findiings compatible with carpal tunnel syndrome). Negative for dizziness, tremors, syncope, speech difficulty, weakness and headaches.       Wears glove on the right hand due to chronic painful median nerve neuropathy.History of painful neuropathy of the feet and lower legs.  Hematological: Negative for adenopathy. Does not bruise/bleed easily.  Psychiatric/Behavioral: Negative for hallucinations, behavioral problems, confusion, sleep disturbance and decreased concentration. The patient is not nervous/anxious.        Mild memory difficulty    Filed Vitals:   07/30/15 1629  BP: 146/72  Pulse: 87  Temp: 98.2 F (36.8 C)  TempSrc: Oral  Resp: 20  Height: 5' 9" (1.753 m)  Weight: 394 lb 12.8 oz (179.08 kg)  SpO2: 98%   Body mass index is 58.28 kg/(m^2). Filed  Weights   07/30/15 1629  Weight: 394 lb 12.8 oz (179.08 kg)     Physical Exam  Constitutional: He is oriented to person, place, and time. He appears well-developed and well-nourished. No distress.  Morbid obesity  HENT:  Right Ear: External ear normal.  Left Ear: External ear normal.  Nose: Nose normal.  Mouth/Throat: Oropharynx is clear and moist. No oropharyngeal exudate.  Eyes: Conjunctivae and EOM are normal. Pupils are equal, round, and reactive to light.  Neck: No JVD present. No tracheal deviation present. No thyromegaly present.  Cardiovascular:  Normal rate, regular rhythm, normal heart sounds and intact distal pulses.  Exam reveals no gallop and no friction rub.   No murmur heard. Pulmonary/Chest: No respiratory distress. He has no wheezes. He has no rales. He exhibits no tenderness.  Abdominal: He exhibits no distension and no mass. There is no tenderness.  Musculoskeletal: He exhibits edema. He exhibits no tenderness.  Using walker. Unstable gait. Tendeer left ankle.  Lymphadenopathy:    He has no cervical adenopathy.  Neurological: He is alert and oriented to person, place, and time. He has normal reflexes. No cranial nerve deficit. Coordination normal.  08/15/14 MMSE 27/30 09/06/2014  PNCV showed severe bilateral median nerve neuropathy consistent with severe carpal tunnel syndrome.  Skin: There is erythema (both shins). No pallor.  Chronic venous stasis changes of both anterior legs. Abrasion right knee laterally.  Psychiatric: He has a normal mood and affect. His behavior is normal. Judgment and thought content normal.    Labs reviewed: Lab Summary Latest Ref Rng 07/30/2015 02/12/2015  Hemoglobin 13.0 - 17.0 g/dL 12.5(L) 12.3(L)  Hematocrit 39.0 - 52.0 % 40.4 37.9  White count 4.0 - 10.5 K/uL 8.1 6.4  Platelet count 150 - 400 K/uL 193 (None)  Sodium 135 - 145 mmol/L 140 139  Potassium 3.5 - 5.1 mmol/L 4.4 4.5  Calcium 8.9 - 10.3 mg/dL 9.6 9.6  Phosphorus - (None)  (None)  Creatinine 0.61 - 1.24 mg/dL 1.26(H) 0.77  AST 15 - 41 U/L 45(H) (None)  Alk Phos 38 - 126 U/L 57 (None)  Bilirubin 0.3 - 1.2 mg/dL 0.4 (None)  Glucose 65 - 99 mg/dL 156(H) 145(H)  Cholesterol - (None) (None)  HDL cholesterol >39 mg/dL (None) 38(L)  Triglycerides 0 - 149 mg/dL (None) 347(H)  LDL Direct - (None) (None)  LDL Calc 0 - 99 mg/dL (None) 116(H)  Total protein 6.5 - 8.1 g/dL 7.2 (None)  Albumin 3.5 - 5.0 g/dL 3.4(L) (None)   Lab Results  Component Value Date   TSH 2.530 03/14/2014   TSH 1.470 12/26/2013   TSH 4.000 09/16/2013   Lab Results  Component Value Date   BUN 26* 07/30/2015   BUN 15 02/12/2015   BUN 18 10/11/2014   Lab Results  Component Value Date   HGBA1C 8.4* 02/12/2015   HGBA1C 8.5* 10/11/2014   HGBA1C 9.3* 07/24/2014   Dg Chest 2 View  07/30/2015  CLINICAL DATA:  Syncope, dizziness, fall this morning, abrasion right tibia fibula EXAM: CHEST  2 VIEW COMPARISON:  08/10/2014 FINDINGS: Cardiomediastinal silhouette is stable. Mild elevation of the right hemidiaphragm again noted. Surgical clips in left upper quadrant of the abdomen. Metallic fixation material noted cervical spine. No acute infiltrate or pleural effusion. No pulmonary edema. Degenerative changes thoracic spine. No gross fractures are noted. There is no pneumothorax. Stable left basilar atelectasis or scarring. IMPRESSION: No active disease. Chronic elevation of the right hemidiaphragm. No infiltrate or pulmonary edema. Stable left basilar atelectasis or scarring Electronically Signed   By: Lahoma Crocker M.D.   On: 07/30/2015 09:19   Dg Tibia/fibula Right  07/30/2015  CLINICAL DATA:  Dizziness, fall this morning, right tibia fibula abrasions EXAM: RIGHT TIBIA AND FIBULA - 2 VIEW COMPARISON:  Right knee same day FINDINGS: Four views of the right tibia fibula submitted. No acute fracture or subluxation. Degenerative changes right knee joint. No radiopaque foreign body. IMPRESSION: No acute  fracture or subluxation. Degenerative changes right knee joint. Electronically Signed   By: Lahoma Crocker M.D.   On:  07/30/2015 09:22   Dg Ankle Complete Right  07/30/2015  CLINICAL DATA:  Status post fall this morning; positioning is limited due to the patient's obesity. EXAM: RIGHT ANKLE - COMPLETE 3+ VIEW COMPARISON:  Right tibia and fibula of today's date FINDINGS: The bones appear adequately mineralized. The ankle joint mortise is reasonably well-maintained. The talar dome and the calcaneus are intact. There are plantar and Achilles region calcaneal spurs. No acute fracture nor dislocation is observed. There is moderate diffuse soft tissue swelling. IMPRESSION: There is no acute bony abnormality of the right ankle. Electronically Signed   By: David  Martinique M.D.   On: 07/30/2015 09:20   Ct Head Wo Contrast  07/30/2015  CLINICAL DATA:  Persistent dizziness after fall in bathtub. EXAM: CT HEAD WITHOUT CONTRAST TECHNIQUE: Contiguous axial images were obtained from the base of the skull through the vertex without intravenous contrast. COMPARISON:  CT scan of September 16, 2013. FINDINGS: Bony calvarium appears intact. No mass effect or midline shift is noted. Ventricular size is within normal limits. There is no evidence of mass lesion, hemorrhage or acute infarction. IMPRESSION: Normal head CT. Electronically Signed   By: Marijo Conception, M.D.   On: 07/30/2015 10:09   Dg Knee Complete 4 Views Right  07/30/2015  CLINICAL DATA:  Syncope, dizziness, fall this morning, right tibia fibula abrasions EXAM: RIGHT KNEE - COMPLETE 4+ VIEW COMPARISON:  None. FINDINGS: Six views of the right knee submitted. No acute fracture or subluxation. There is diffuse narrowing of joint space most significant medial compartment. Spurring of medial and lateral tibial plateau. Spurring of femoral condyles. Narrowing of patellofemoral joint space. Spurring of patella. Tiny joint effusion. IMPRESSION: No acute fracture or subluxation.  Degenerative changes as described above. Electronically Signed   By: Lahoma Crocker M.D.   On: 07/30/2015 09:21   Assessment/Plan  1. Type 2 diabetes mellitus with diabetic neuropathy, unspecified long term insulin use status (HCC) - AMB Referral to [facility] - Comprehensive metabolic panel; Future - Hemoglobin A1c; Future  2. Syncope, unspecified syncope type History is not sufficiently clear to whether or not he blacked out.  3. Pain in joint, ankle and foot, left Improving  4. DM type 2, uncontrolled, with neuropathy ([place]) Controlled  5. Essential hypertension - metoprolol (LOPRESSOR) 50 MG tablet; One twice daily to lower BP and to regulate heart rate  Dispense: 60 tablet; Refill: 5 - Comprehensive metabolic panel; Future  6. Hereditary and idiopathic peripheral neuropathy Some numbness and diminished sensation in both legs  7. Morbid obesity due to excess calories [facility]) Patient is quite sedentary overall. Dietary restrictions were discussed today. Patient was provided education.

## 2015-07-30 NOTE — ED Notes (Signed)
Pt ambulated in hall with this RN and Barbee Shropshire, RN. Pt tolerated ambulation well, appeared steady on his feet.

## 2015-08-05 ENCOUNTER — Other Ambulatory Visit: Payer: Self-pay

## 2015-08-05 DIAGNOSIS — R296 Repeated falls: Secondary | ICD-10-CM

## 2015-08-05 DIAGNOSIS — Z9181 History of falling: Secondary | ICD-10-CM

## 2015-08-05 DIAGNOSIS — E1110 Type 2 diabetes mellitus with ketoacidosis without coma: Secondary | ICD-10-CM

## 2015-08-05 NOTE — Patient Outreach (Signed)
[facility]) Care Management  08/05/2015  Phillip Maldonado Aug 01, 1950 [id number]  Telephone Screen and Initial Assessment   Referral Date:  08/01/15 Source:  Dr. Viviann Spare. [place] Issue:  Diabetes.  Patient has been falling a lot.    Outreach call #1 to patient.  Patient reached and completed call.   Providers: Primary MD: Dr. Viviann Spare. Green -  last appt:  07/30/2015   next appt:  10/30/15 (Lab A1C scheduled for 10/28/15). HH: none  Insurance: [employer] Complete   Psycho/Social: Patient is divorced and lives alone in an apartment. Ground floor with  handicap parking sticker which needs renewal.  Patient states his wife left him in 2012  following 25 years of marriage due to getting  tired of caring for patient / found out patient needed thoracic and spine surgery and left patient.  Patient has two sons, Hetty Blend and Vonna Kotyk 90 and 65 yo still living with ex-wife in the [place] area and visits patient for weekend visitation.  Patient is from [place] and has lived in [place] for 3 years.   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  States $400 behind on apartment rent;  legal aide services assisting.  Mobility: Difficult mobility associated to rods "from base of skull to hip."  Using a cane.   Falls:  Last fall  06/30/15 at 4 am while getting up during night to use bathroom;  fell asleep on commode and legs were numb when patient stood up.  Patient has a safety hand rail in bathroom and had his cane.  Patient thinks he got up around 3:30 am.   It took patient 3 hours to crawl from the bathroom to living room to get to his cell phone.  Patient  also had difficulty unlocking door for EMT.  EMT transferred patient to ED.  Injury:  Patient busted his right knee and twisted right ankle; Lump on forehead; CT:  Ok.  Still having pain in Left ankle with fracture about 2 weeks ago.  Dr. Carlota Raspberry  placed referral to [facility]; but patient C/O issues with co-pays and patient did not have $50.00 for co-pay which he was required to pay before being seen.  Pain:  Yes associated to OA in all joints, Degenerative bone disease, Bilateral Carpel Tunnel, Neuropathy.    Depression: yes.   Transportation: owns a 65 yo car that has mechanical needs and car will not pass inspection  Caregiver:  None.  Patient has 3 estranged sisters but no contact since parents burial in 2014.   Advance Directive:  Yes; MD does not have copy.  HCPOA, Lavinia Sharps, Middle sister, but patient states sister will have nothing to do with him since 2014.  Resources: -SSD -  States he makes too much for Medicaid.  $1,370/mo and 2 minor children; each child gets 370/child. Consent:  yes DME:  Cane,  Rolator, CBG Meter ([employer] Auto[employer] Meter 2014), Safety hand rail by toilet and tub.   [employer] electronic scales - weighed  3 times a week Contact Nurse, [name] calls if patient skips a week.  Last contact call 08/05/15, NO BP cuff.   Patient states he needs tub bench.  Co-morbidities: DM 2, CHF, Atrial Fibrillation, PVD, Essential HTN, HDL, OSA, Morbid Obesity, Anemia, Major depression, Chronic pain syndrome, OA, Degenerative Disk Disease, Bilateral Carpel Tunnel, Neuropathy, H/o Gastric Bypass Admissions: 0 ER visits: 1  DM 2 Patient self checks  his BS twice a day.   Morning BS:  154 A1C: 8.4 Weight:  387 Results for Maldonado, Phillip (MRN [id number]) as of 08/05/2015 17:11  Ref. Range 12/26/2013 11:24 05/16/2014 13:05 07/24/2014 16:31 10/11/2014 08:33 02/12/2015 14:24  Hemoglobin A1C Latest Ref Range: 4.8-5.6 % 10.0 (H) 9.0 (H) 9.3 (H) 8.5 (H) 8.4 (H)   Podiatrist:  None.  Patient has neuropathy, burning sensation to bottom of feet; Big toe left foot with dark nail and stays sore and aches all the time.  Assessed by Dr. Nyoka Cowden on last appt 07/30/15 Eyes:  Last eye exam about 03/2015.  Eyeglasses updated 03/2015 [facility] covered exam and glasses).  Patient was not able to get full exam to assess for further testing such as glaucoma due to additional charges of $20 and $49 dollars.  [facility], [place], [place] - patient unable to recall MD name.  Dentist:  2013.   States all teeth are decayed but remain intact.  States he is ashamed of letting anyone see inside his mouth.  Patient called [employer] and has dental insurance but only covers 2 cleanings / year.  [employer] recommended a program on [place] but patient needed full x-ray $195.00 and then would consult.   Hearing:  Bad hearing on exam 2014; recommended bilateral aides but die not obtain due to cost.   Bilateral Carpel Tunnel  States needs surgery but can not afford the 20% of his cost.  Medications:  Patient taking  less than 15 medications  Co-pay cost issues: $30 or less cost for entire month.  Flu Vaccine: 12/26/14 Pneumonia Vaccine: 01/05/13  tDAP Vaccine:  09/11/14  Objective:   Encounter Medications:  Outpatient Encounter Prescriptions as of 08/05/2015  Medication Sig Note  . acetaminophen (TYLENOL) 650 MG CR tablet Take 1,950 mg by mouth 2 (two) times daily.   Marland Kitchen amLODipine (NORVASC) 10 MG tablet Take 10 mg by mouth daily. 08/10/2014: ...  . atorvastatin (LIPITOR) 40 MG tablet take 1 tablet once daily (Patient taking differently: take 40mg  by mouth once daily)   . DULoxetine (CYMBALTA) 60 MG capsule One twice daily to help neuropathy (Patient taking differently: Take 60 mg by mouth 2 (two) times daily. One twice daily to help neuropathy)   . glucose blood (ACCU-CHEK AVIVA PLUS) test strip 1 each by Other route See admin instructions. Check blood sugar twice daily   . Insulin Pen Needle (NOVOFINE) 30G X 8 MM MISC Inject 1 packet into the skin See admin instructions. Use with insulin once daily.   Marland Kitchen LANTUS SOLOSTAR 100 UNIT/ML Solostar Pen inject 22-24 UNITS EVERY MORNING   . lisinopril (PRINIVIL,ZESTRIL) 20 MG tablet TAKE  ONE TABLET DAILY to control blood pressure and strengthen the heart   . lubiprostone (AMITIZA) 24 MCG capsule Take 1 capsule (24 mcg total) by mouth 2 (two) times daily with a meal.   . LYRICA 100 MG capsule Take 100 mg by mouth 3 (three) times daily. In addition to prn dose 07/30/2015: Received from: External Pharmacy Received Sig: TK ONE C PO  TID FOR NEUROPATHIC PAINS  . LYRICA 50 MG capsule Take 50 mg by mouth 3 (three) times daily as needed. Pain - in addition to routine dose 07/30/2015: Received from: External Pharmacy Received Sig: TAKE ONE CAPSULE THREE TIMES DAILY AS NEEDED FOR PAIN  . metFORMIN (GLUCOPHAGE) 1000 MG tablet Take 1 tablet (1,000 mg total) by mouth 2 (two) times daily with a meal.   . methadone (  DOLOPHINE) 10 MG tablet Take One tablet by mouth 4 times daily  to control pain (Patient taking differently: Take 10 mg by mouth 4 (four) times daily. Take One tablet by mouth 4 times daily  to control pain)   . metoprolol (LOPRESSOR) 50 MG tablet One twice daily to lower BP and to regulate heart rate   . Multiple Vitamin (MULITIVITAMIN WITH MINERALS) TABS Take 1 tablet by mouth daily. 08/10/2014: Centrum Silver  . naproxen sodium (ALEVE) 220 MG tablet Take two twice daily to help pain   . nystatin cream (MYCOSTATIN) Apply twice daily to rash   . pantoprazole (PROTONIX) 40 MG tablet TAKE 1 TABLET TWO TIMES DAILY.   Marland Kitchen polyethylene glycol (MIRALAX / GLYCOLAX) packet Take 17 g by mouth daily as needed for mild constipation or moderate constipation.   . pregabalin (LYRICA) 150 MG capsule Take one capsule 3 times daily to help neuropathic pains   . torsemide (DEMADEX) 100 MG tablet Take 1 tablet (100 mg total) by mouth 2 (two) times daily.   . traZODone (DESYREL) 100 MG tablet TAKE 3 TABLETS (300MG ) AT BEDTIME   . triamcinolone cream (KENALOG) 0.1 % Apply sparingly up to twice daily to rash until it is resolved    No facility-administered encounter medications on file as of 08/05/2015.     Functional Status:  In your present state of health, do you have any difficulty performing the following activities: 08/06/2015 08/10/2014  Hearing? Y N  Vision? N N  Difficulty concentrating or making decisions? N N  Walking or climbing stairs? Y Y  Dressing or bathing? N Y  Doing errands, shopping? N N  Preparing Food and eating ? N -  Using the Toilet? N -  In the past six months, have you accidently leaked urine? N -  Do you have problems with loss of bowel control? N -  Managing your Medications? N -  Managing your Finances? N -  Housekeeping or managing your Housekeeping? N -    Fall/Depression Screening: PHQ 2/9 Scores 08/06/2015 06/27/2015 09/11/2014 07/25/2014 02/27/2014 02/14/2014 12/26/2013  PHQ - 2 Score 2 0 0 0 0 0 0  PHQ- 9 Score 5 - - - - - -   Fall Risk  08/06/2015 07/23/2015 06/27/2015 04/24/2015 02/12/2015  Falls in the past year? Yes No No No No  Number falls in past yr: 2 or more - - - -  Injury with Fall? Yes - - - -  Risk Factor Category  High Fall Risk - - - -  Risk for fall due to : - - - - -  Follow up Falls evaluation completed;Education provided;Falls prevention discussed - - - -   Assessment: Patient with multiple co-morbidities and health care barriers associated to Psycho/Social issues.  Elevated A1C.  Patient unable to manage healthcare due to financial barriers.    Plan:  Referral Date:  08/01/15 Source:  Dr. Viviann Spare. Green Issue:  Diabetes.  Patient has been falling a lot.   Screening and Initial Assessment:  08/05/2015  [facility] Telephonic RN CM Referral  -Fall Risk:  Follow-up date of last Bone Density Study testing date.  -Fracture:  Ortho appt pending; patient unable to afford co-pay -Tub bench (call PT for recommendation of bench versus shower chair) Disease Management:  Multiple co-morbidities -A1C 8.4 -Hearing aid benefit review -Patient with no home scale once [facility] pulls electronic scale out of home.   [facility] Education mailed  08/06/2015 -Diabetes:  Why Get Your A1C Checked? -  Diabetes:  Controlling Blood Sugar -Diabetes- Preventing Heart Attack and Stroke Advanced Directive Document mailed 08/06/2015  SW Referral -Community resources:  Marketing executive. -Financial Assessment -Co-pay issues for MD appt's.  Manufacturing engineer will not pass inspection -Psycho/Social Assessment:  Depression  -Advance Directives need updating; patient to select new HCPOA.  -Dental Resources / no cost  RN CM advised patient in next [facility] scheduled contact call within the next 30 days for Telephonic Monthly Assessment     RN CM advised to please notify MD of any changes in condition prior to scheduled appt's.   RN CM provided contact name and # [phone] or main office # [phone] and 24-hour nurse line # [phone].  RN CM confirmed patient is aware of 911 services for urgent emergency needs.  RN CM sent successful outreach letter and  [facility] Introductory package. RN CM notified [place] Management Assistant: agreed to services/case opened. RN CM sent [facility] [employer] letter and routed assessment.   Mariann Laster, RN, BSN, [facility], CCM  [employer] Management Coordinator [phone] Direct [phone] Cell [phone] Office [phone] Fax

## 2015-08-06 ENCOUNTER — Encounter: Payer: Self-pay | Admitting: Licensed Clinical Social Worker

## 2015-08-06 ENCOUNTER — Other Ambulatory Visit: Payer: Self-pay | Admitting: Licensed Clinical Social Worker

## 2015-08-06 DIAGNOSIS — Z9181 History of falling: Secondary | ICD-10-CM | POA: Insufficient documentation

## 2015-08-06 DIAGNOSIS — R296 Repeated falls: Secondary | ICD-10-CM | POA: Insufficient documentation

## 2015-08-06 NOTE — Patient Outreach (Signed)
[facility] [address]) Care Management  08/06/2015  Phillip Maldonado 11-26-1950 [id number]   Assessment- CSW received new referral on patient for financial assistance, community resources and transportation. CSW completed outreach to patient and patient answered successfully. Patient provided HIPPA verifications. CSW introduced self, reason for call and of [facility] social work services. Patient agreeable to social work services. Patient reports having issues with finances and transportation. Patient is agreeable to home visit on 08/08/15.   Plan-CSW will complete home visit on 08/08/15 and send involvement letter to PCP.  Eula Fried, BSW, MSW, [email or web address] Phone: [phone] Fax: [phone]

## 2015-08-08 ENCOUNTER — Other Ambulatory Visit: Payer: Self-pay | Admitting: Licensed Clinical Social Worker

## 2015-08-08 ENCOUNTER — Other Ambulatory Visit: Payer: Self-pay | Admitting: Internal Medicine

## 2015-08-08 NOTE — Patient Outreach (Signed)
[facility]) Care Management  [facility] Social Work  08/08/2015  Phillip Maldonado 05/04/1950 [id number]    Encounter Medications:  Outpatient Encounter Prescriptions as of 08/08/2015  Medication Sig Note  . acetaminophen (TYLENOL) 650 MG CR tablet Take 1,950 mg by mouth 2 (two) times daily.   Marland Kitchen amLODipine (NORVASC) 10 MG tablet Take 10 mg by mouth daily. 08/10/2014: ...  . atorvastatin (LIPITOR) 40 MG tablet take 1 tablet once daily (Patient taking differently: take 40mg  by mouth once daily)   . DULoxetine (CYMBALTA) 60 MG capsule One twice daily to help neuropathy (Patient taking differently: Take 60 mg by mouth 2 (two) times daily. One twice daily to help neuropathy)   . glucose blood (ACCU-CHEK AVIVA PLUS) test strip 1 each by Other route See admin instructions. Check blood sugar twice daily   . Insulin Pen Needle (NOVOFINE) 30G X 8 MM MISC Inject 1 packet into the skin See admin instructions. Use with insulin once daily.   Marland Kitchen LANTUS SOLOSTAR 100 UNIT/ML Solostar Pen inject 22-24 UNITS EVERY MORNING   . lisinopril (PRINIVIL,ZESTRIL) 20 MG tablet TAKE ONE TABLET DAILY to control blood pressure and strengthen the heart   . lubiprostone (AMITIZA) 24 MCG capsule Take 1 capsule (24 mcg total) by mouth 2 (two) times daily with a meal.   . LYRICA 100 MG capsule Take 100 mg by mouth 3 (three) times daily. In addition to prn dose 07/30/2015: Received from: External Pharmacy Received Sig: TK ONE C PO  TID FOR NEUROPATHIC PAINS  . LYRICA 50 MG capsule Take 50 mg by mouth 3 (three) times daily as needed. Pain - in addition to routine dose 07/30/2015: Received from: External Pharmacy Received Sig: TAKE ONE CAPSULE THREE TIMES DAILY AS NEEDED FOR PAIN  . metFORMIN (GLUCOPHAGE) 1000 MG tablet Take 1 tablet (1,000 mg total) by mouth 2 (two) times daily with a meal.   . methadone (DOLOPHINE) 10 MG tablet Take One tablet by mouth 4 times daily  to control pain (Patient taking differently: Take 10  mg by mouth 4 (four) times daily. Take One tablet by mouth 4 times daily  to control pain)   . metoprolol (LOPRESSOR) 50 MG tablet One twice daily to lower BP and to regulate heart rate   . Multiple Vitamin (MULITIVITAMIN WITH MINERALS) TABS Take 1 tablet by mouth daily. 08/10/2014: Centrum Silver  . naproxen sodium (ALEVE) 220 MG tablet Take two twice daily to help pain   . nystatin cream (MYCOSTATIN) Apply twice daily to rash   . pantoprazole (PROTONIX) 40 MG tablet TAKE 1 TABLET TWO TIMES DAILY.   Marland Kitchen polyethylene glycol (MIRALAX / GLYCOLAX) packet Take 17 g by mouth daily as needed for mild constipation or moderate constipation.   . pregabalin (LYRICA) 150 MG capsule Take one capsule 3 times daily to help neuropathic pains   . torsemide (DEMADEX) 100 MG tablet Take 1 tablet (100 mg total) by mouth 2 (two) times daily.   . traZODone (DESYREL) 100 MG tablet TAKE 3 TABLETS (300MG ) AT BEDTIME   . triamcinolone cream (KENALOG) 0.1 % Apply sparingly up to twice daily to rash until it is resolved    No facility-administered encounter medications on file as of 08/08/2015.    Functional Status:  In your present state of health, do you have any difficulty performing the following activities: 08/06/2015 08/10/2014  Hearing? Y N  Vision? N N  Difficulty concentrating or making decisions? N N  Walking or climbing stairs? Phillip Maldonado  Y  Dressing or bathing? N Y  Doing errands, shopping? N N  Preparing Food and eating ? N -  Using the Toilet? N -  In the past six months, have you accidently leaked urine? N -  Do you have problems with loss of bowel control? N -  Managing your Medications? N -  Managing your Finances? N -  Housekeeping or managing your Housekeeping? N -    Fall/Depression Screening:  PHQ 2/9 Scores 08/06/2015 08/06/2015 06/27/2015 09/11/2014 07/25/2014 02/27/2014 02/14/2014  PHQ - 2 Score 2 2 0 0 0 0 0  PHQ- 9 Score 3 5 - - - - -    Assessment: CSW completed initial home visit on 08/08/15. CSW  lives in his apartment alone. He has four children and two younger sons ages 26 and 36 who stay with him at times. Patient is in need of community resource support. Patient has multiple health conditions. Patient has scales but does not have blood pressure monitor. CSW ordered one for him through [facility]. CSW sent email to [place] Management Assistant. Patient was provided with a [facility] Calendar and pill box. Patient reports that he is interested in completing a new advance directive (previous health care agent does not have relationship with patient any longer.) CSW completed advance directive but patient needs to gain daughter's address and discuss health care decisions with her. Patient is agreeable to advance directive goals. Patient wishes to have document notarized at his bank and not at [facility]. Patient is morbidly obese and does not get out of the house much. He isolates in his apartment daily. Patient is in need of mental health resources. CSW reviewed resources with him. He spent most of the session discussing the separation between he and his wife which has caused him a lot of stress and depression. Divorce took place six years ago per patient report. CSW provided patient with coping tool methods and also provided emotional support throughout the session. Patient is agreeable to contacting several mental health agencies to gain information on counseling. However, he admits that he has a lack of motivation to do much calling himself "too lazy to do much." CSW spent time providing motivational interviewing interventions and discussing what he does want out of his life. Patient reports that he has a desire to do better but has a difficult time committing or facing fears. Patient gives example "I heard of this great place the [facility] and I know I need to exercise and talk to people so I went there. I sat in the parking lot and never got out of the car because I didn't feel like it." CSW  provided patient with [facility] calendar of events which includes contact information. CSW provided education on the importance of socialization, exercise and health. Patient states that he will consider going there. Patient previously went to the [employer] program at the [facility] but stopped over a year ago. CSW provided information on program as a reminder. Patient reports that he is on a psychiatric medication and it is effective but he wishes to talk to someone to gain direction on life. CSW encouraged patient to contact mental health agencies and educated him on [facility]'s sliding scale fee for services. CSW also has an interest in working again but feels "too weak" to do so. Patient requested information on a program to help him complete a resume. CSW provided list of vocational rehabilitation programs in [place]. 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 CSW provided patient with a list of financial resources and spent time reviewing these with him. Patient states that he has a difficult time asking for help but may start. CSW educated him on the [facility] program in [place] and gave him contact information for it. Patient states that his bank offers credit counseling as well. Patient has a difficult time managing his finances but states that his apartment complex has offered him a renewal lease agreement starting August of 2017 which will be an all inclusive rent payment that includes rent, cable, water, trash and power. Patient states that this will help him with his finances. Patient was provided with a list of local food pantries and where to gain a free meal daily in the community.  CSW also provided patient with a list of dental resources and reviewed them. Patient also was provided with a list of transportation resources within [facility]. He chooses [facility].  CSW completed entire [facility] application with patient and faxed it to [facility] today on 08/08/15. CSW spent time reviewing all resources again with patient. Patient was provided with counseling interventions during today's visit. Patient is agreeable to social work goals.   [facility] CM Care Plan Problem One        Most Recent Value   Care Plan Problem One  Lack of community resources and need for support   Role Documenting the Problem One  Clinical Social Worker   Care Plan for Problem One  Active   [facility] Long Term Goal (31-90 days)  Patient will be provided community resources for finances, food pantries, dental, mental, socialization and will review them and be able to read back information within 90 days   [facility] Long Term Goal Start Date  08/08/15   Interventions for Problem One Long Term Goal  CSW will complete home visit and provided various community resoruces and will review them with patient as needed. CSW will encourage patient to implement resources to benefit his overall well-being and health.   [facility] CM Short Term Goal #1 (0-30 days)  Patient will gain stable transportation within 30 days with [facility]   [facility] CM Short Term Goal #1 Start Date  08/08/15   Interventions for Short Term Goal #1  CSW will complete home visit and complete [facility] application with patient. CSW will fax document to [facility] and monitor process of gaining services.   [facility] CM Short Term Goal #2 (0-30 days)  Patient will discuss advance directives and end of life planning with daughter within 30 days   [facility] CM Short Term Goal #2 Start Date  08/08/15   Interventions for Short Term Goal #2  CSW will complete home visit and assist him in completing advance directive. Patient needs to discuss decisions with daughter and gain her address to complete document.      Plan: CSW will complete next outreach within one week. CSW will follow up on community resources. CSW will monitor process with [facility] (application faxed.) CSW will ensure that patient  receives blood pressure monitor. CSW will route entire encounter to PCP for updates.  Eula Fried, BSW, MSW, [email or web address] Phone: [phone] Fax: [phone]

## 2015-08-15 ENCOUNTER — Telehealth: Payer: Self-pay

## 2015-08-15 DIAGNOSIS — Z1211 Encounter for screening for malignant neoplasm of colon: Secondary | ICD-10-CM

## 2015-08-15 NOTE — Telephone Encounter (Signed)
Left message for patient to call me back.  Per Dr Carlean Purl colon recall changed to an IFOB test, orders in.  Need to inform patient of this and our lab hours as well.

## 2015-08-16 ENCOUNTER — Other Ambulatory Visit: Payer: Self-pay | Admitting: *Deleted

## 2015-08-16 DIAGNOSIS — G5610 Other lesions of median nerve, unspecified upper limb: Secondary | ICD-10-CM

## 2015-08-16 MED ORDER — METHADONE HCL 10 MG PO TABS: ORAL_TABLET | ORAL | Status: DC

## 2015-08-16 NOTE — Telephone Encounter (Signed)
Patient was notified that Rx is ready to be picked up.

## 2015-08-16 NOTE — Telephone Encounter (Signed)
Patient requested and will pick up 

## 2015-08-21 NOTE — Telephone Encounter (Signed)
Left message for patient to call back  

## 2015-08-21 NOTE — Telephone Encounter (Signed)
Left voicemail for patient to call back. 

## 2015-08-23 ENCOUNTER — Other Ambulatory Visit: Payer: Self-pay | Admitting: Licensed Clinical Social Worker

## 2015-08-23 NOTE — Patient Outreach (Signed)
[facility]) Care Management  08/23/2015  Phillip Maldonado 09-May-1950 [id number]   Assessment- CSW completed outreach to patient on 08/23/15 and patient answered successfully. Patient reports that he is doing "well." He shares that "nothing much has changed since the last time you saw me." CSW questioned if he had made calls to the mental health agencies that she provided to him. Patient reports that he has called a few but cannot remember the names. He states that he is still trying to figure out how much he will owe for the agencies he called. CSW encouraged him just to simply ask what the co pay will be. Patient reports his specialist fee with his insurance is $50.00. Patient is agreeable to contacting mental health resources and questioning about cost of services. CSW questioned if he had heard back from [facility]. He reports that he did and attended the assessment appointment this week. He reports that the final decision will have to be made by someone other than the one that interviewed him but that they are out of town at this time and he will be notified of decision by mail. CSW questioned if he had talked to his daughter in order to gain additional information to complete advance directives. He reports that she never contacted him back. CSW encouraged him to make another attempt to reach her. CSW reviewed resources for socialization again. Patient reports that he contact the [facility] and gained information about program and requested that they send out the newsletter which includes their activities for this month. CSW appraised him for such progress since home visit. CSW informed him that this CSW will be out of the office all of next week.  Plan-CSW will make outreach attempt within three weeks to follow up on community resources and [facility].  Eula Fried, BSW, MSW, [email or web address] Phone: [phone] Fax:  [phone]

## 2015-08-23 NOTE — Telephone Encounter (Signed)
Left voicemail for patient to call back. If he does not return our call this time (our 3rd attempt at reaching him), we will mail a letter to his home address.

## 2015-08-28 ENCOUNTER — Other Ambulatory Visit: Payer: Self-pay | Admitting: *Deleted

## 2015-08-28 ENCOUNTER — Other Ambulatory Visit: Payer: Self-pay | Admitting: Internal Medicine

## 2015-08-28 MED ORDER — TRAZODONE HCL 100 MG PO TABS: ORAL_TABLET | ORAL | Status: DC

## 2015-08-28 NOTE — Telephone Encounter (Signed)
Patient requested refill to be faxed to pharmacy.  

## 2015-08-29 ENCOUNTER — Other Ambulatory Visit: Payer: Self-pay | Admitting: *Deleted

## 2015-08-29 MED ORDER — TRAZODONE HCL 100 MG PO TABS: ORAL_TABLET | ORAL | Status: DC

## 2015-08-29 NOTE — Telephone Encounter (Signed)
Patient called and requested Rx to be sent to [facility] not to the [employer].

## 2015-09-02 ENCOUNTER — Ambulatory Visit: Payer: Self-pay

## 2015-09-04 ENCOUNTER — Ambulatory Visit: Payer: Self-pay

## 2015-09-04 ENCOUNTER — Other Ambulatory Visit: Payer: Self-pay | Admitting: Licensed Clinical Social Worker

## 2015-09-04 NOTE — Telephone Encounter (Signed)
As Phillip Maldonado has not called [place] back I'm mailing him a letter today to come get the IFOB stool test done and to call me with any questions.

## 2015-09-04 NOTE — Patient Outreach (Signed)
[facility]) Care Management  09/04/2015  DENISON FARWELL Aug 14, 1950 [id number]   Assessment- CSW completed outreach to patient. Patient answered. Patient reports that he has had his two sons staying with him this past week and that he has been very busy which has been helpful to him. Patient questioned if he had heard back from [facility] confirming if he had been approved for services. Patient states that he has not received a letter. CSW will make outreach to [facility] to inquire about this.  Plan-CSW sent secure email to Floydene Flock in regards to patient's [facility] status. CSW will await to hear back from [facility] and then update patient.  Eula Fried, BSW, MSW, [email or web address] Phone: [phone] Fax: [phone]

## 2015-09-05 ENCOUNTER — Other Ambulatory Visit: Payer: Medicare Other

## 2015-09-05 NOTE — Patient Outreach (Signed)
[facility]) Care Management  09/05/2015  Phillip Maldonado 07/26/1950 [id number]   Telephone Monthly Assessment  Program:  Diabetes Insurance: UHC Complete   Providers: Primary MD: Dr. Viviann Spare. Green - last appt: 07/30/2015 next appt: 10/30/15 (Lab A1C scheduled for 10/28/15). UHC: Nurse [name] HH: none   Psycho/Social: Patient is divorced and lives alone in an apartment. Ground floor with handicap parking sticker which needs renewal. H/o wife left him in 2012 following 25 years of marriage due to getting tired of caring for patient / found out patient needed thoracic and spine surgery and left patient. Patient has two sons, Hetty Blend and Vonna Kotyk 69 and 65 yo still living with ex-wife in the [place] area and visits patient for weekend visitation. Patient is from [place] and has lived in [place] for 3 years.  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  Mobility:  Ambulates with a cane due to difficult mobility associated to rods "from base of skull to hip."   Falls: H/o  Last fall 06/30/15 at 4 am while getting up during night to use bathroom; fell asleep on commode and legs were numb when patient stood up. Patient has a safety hand rail in bathroom and had his cane. Patient thinks he got up around 3:30 am. It took patient 3 hours to crawl from the bathroom to living room to get to his cell phone. Patient had difficulty unlocking door for EMT. EMT transferred patient to ED. H/o associated fall Injury: Patient busted his right knee and twisted right ankle; Lump on forehead; CT was ok.  Update:  Patient continues to have pain in Left ankle with fracture about 6 -7 weeks ago. Dr. Carlota Raspberry placed referral to [facility]; but patient C/O issues with co-pays and patient did not have $50.00 for co-pay which he was required to pay before being seen.  UPDATE:   Patient states plan to schedule appt with his past Orthopedic MD in [place].  States SCAT will transport and will make arrangements once he receives SCAT ID.   Pain: Yes associated to OA in all joints, Degenerative bone disease, Bilateral Carpel Tunnel, Neuropathy, Right Hip Pain.   Depression: yes.  Transportation: owns a 65 yo car that has mechanical needs and car will not pass inspection  Caregiver: None. H/o 3 estranged sisters but no contact since parents burial in 2014.  Advance Directive: Yes; MD does not have copy. HCPOA, Phillip Maldonado, Middle sister, but patient states sister will have nothing to do with him since 2014.  Patient approved for SCAT and waiting on receipt of ID via mail.  Resources: -SSD - States he makes too much for Medicaid. $1,370/mo and 2 minor children; each child gets 370/child. -Legal Aide Services:  Issues with paying rent on housing.  -SCAT -([employer] covered exam and glasses). Consent: yes DME: Cane, Rolator, CBG Meter ([employer] Auto[employer] Meter 2014), Safety hand rail by toilet and tub.  UHC electronic scales - weighed 3 times a week Contact Nurse, [name] calls if patient skips a week.   Co-morbidities: DM 2, CHF, Atrial Fibrillation, PVD, Essential HTN, HDL, OSA, Morbid Obesity, Anemia, Major depression, Chronic pain syndrome, OA, Degenerative Disk Disease, Bilateral Carpel Tunnel, Neuropathy, H/o Gastric Bypass Admissions: 0 ER visits: 1  DM 2 Patient self checks his BS twice a day.  Morning BS: 150's A1C: 8.4 Weight: 387 No appt's since last contact call.    Podiatrist: None. Patient has  neuropathy, burning sensation to bottom of feet; Big toe left foot with dark nail and stays sore and aches all the time. Assessed by Dr. Nyoka Cowden on last appt 07/30/15 Eyes: Last eye exam about 03/2015. Eyeglasses updated 03/2015 [facility] covered exam and glasses). Patient was not able to get full exam to assess for further testing such  as glaucoma due to additional charges of $20 and $49 dollars.  [facility], [place], [place] - patient unable to recall MD name.  Dentist: 2013. States all teeth are decayed but remain intact. States he is ashamed of letting anyone see inside his mouth. Patient called UHC and has dental insurance but only covers 2 cleanings / year. UHC recommended a program on [place] but patient needed full x-ray $195.00 and then would consult.  Hearing: Bad hearing on exam 2014; recommended bilateral aides but did not obtain due to cost.   Bilateral Carpel Tunnel  States needs surgery but can not afford the 20% of his cost.  Medications:  Patient taking less than 15 medications  Co-pay cost issues: $30 or less cost for entire month.  Flu Vaccine: 12/26/14 Pneumonia Vaccine: 01/05/13  tDAP Vaccine: 09/11/14 Medication Reconciliation completed with patient  09/05/2015   Encounter Medications:  Outpatient Encounter Prescriptions as of 09/05/2015  Medication Sig Note  . acetaminophen (TYLENOL) 650 MG CR tablet Take 1,950 mg by mouth 2 (two) times daily.   Marland Kitchen amLODipine (NORVASC) 10 MG tablet Take 10 mg by mouth daily. 08/10/2014: ...  . atorvastatin (LIPITOR) 40 MG tablet take 1 tablet once daily (Patient taking differently: take 40mg  by mouth once daily)   . DULoxetine (CYMBALTA) 60 MG capsule One twice daily to help neuropathy (Patient taking differently: Take 60 mg by mouth 2 (two) times daily. One twice daily to help neuropathy)   . glucose blood (ACCU-CHEK AVIVA PLUS) test strip 1 each by Other route See admin instructions. Check blood sugar twice daily   . Insulin Pen Needle (NOVOFINE) 30G X 8 MM MISC Inject 1 packet into the skin See admin instructions. Use with insulin once daily.   Marland Kitchen LANTUS SOLOSTAR 100 UNIT/ML Solostar Pen inject 22-24 UNITS EVERY MORNING   . lisinopril (PRINIVIL,ZESTRIL) 20 MG tablet TAKE ONE TABLET DAILY to control blood pressure and  strengthen the heart   . lubiprostone (AMITIZA) 24 MCG capsule Take 1 capsule (24 mcg total) by mouth 2 (two) times daily with a meal.   . LYRICA 100 MG capsule Take 100 mg by mouth 3 (three) times daily. In addition to prn dose 07/30/2015: Received from: External Pharmacy Received Sig: TK ONE C PO  TID FOR NEUROPATHIC PAINS  . LYRICA 50 MG capsule Take 50 mg by mouth 3 (three) times daily as needed. Pain - in addition to routine dose 07/30/2015: Received from: External Pharmacy Received Sig: TAKE ONE CAPSULE THREE TIMES DAILY AS NEEDED FOR PAIN  . metFORMIN (GLUCOPHAGE) 1000 MG tablet Take 1 tablet (1,000 mg total) by mouth 2 (two) times daily with a meal.   . methadone (DOLOPHINE) 10 MG tablet Take One tablet by mouth 4 times daily  to control pain   . metoprolol (LOPRESSOR) 50 MG tablet One twice daily to lower BP and to regulate heart rate   . Multiple Vitamin (MULITIVITAMIN WITH MINERALS) TABS Take 1 tablet by mouth daily. 08/10/2014: Centrum Silver  . naproxen sodium (ALEVE) 220 MG tablet Take two twice daily to help pain   . nystatin cream (MYCOSTATIN) Apply  twice daily to rash   . pantoprazole (PROTONIX) 40 MG tablet TAKE 1 TABLET TWICE DAILY   . polyethylene glycol (MIRALAX / GLYCOLAX) packet Take 17 g by mouth daily as needed for mild constipation or moderate constipation.   . pregabalin (LYRICA) 150 MG capsule Take one capsule 3 times daily to help neuropathic pains   . torsemide (DEMADEX) 100 MG tablet Take 1 tablet (100 mg total) by mouth 2 (two) times daily.   . traZODone (DESYREL) 100 MG tablet Take three tablets by mouth at bedtime for rest   . triamcinolone cream (KENALOG) 0.1 % Apply sparingly up to twice daily to rash until it is resolved    No facility-administered encounter medications on file as of 09/05/2015.    Functional Status:  In your present state of health, do you have any difficulty performing the following activities: 09/05/2015 08/06/2015  Hearing? N Y  Vision? N N   Difficulty concentrating or making decisions? N N  Walking or climbing stairs? Y Y  Dressing or bathing? N N  Doing errands, shopping? Y N  Preparing Food and eating ? N N  Using the Toilet? N N  In the past six months, have you accidently leaked urine? N N  Do you have problems with loss of bowel control? N N  Managing your Medications? N N  Managing your Finances? N N  Housekeeping or managing your Housekeeping? Y N    Fall/Depression Screening: PHQ 2/9 Scores 09/05/2015 08/06/2015 08/06/2015 06/27/2015 09/11/2014 07/25/2014 02/27/2014  PHQ - 2 Score 0 2 2 0 0 0 0  PHQ- 9 Score - 3 5 - - - -    Fall Risk  09/05/2015 08/06/2015 07/23/2015 06/27/2015 04/24/2015  Falls in the past year? Yes Yes No No No  Number falls in past yr: 2 or more 2 or more - - -  Injury with Fall? Yes Yes - - -  Risk Factor Category  High Fall Risk High Fall Risk - - -  Risk for fall due to : History of fall(s);Impaired balance/gait;Impaired mobility - - - -  Follow up Falls evaluation completed;Education provided;Falls prevention discussed Falls evaluation completed;Education provided;Falls prevention discussed - - -  \   Assessment: Patient with multiple co-morbidities and health care barriers associated to Psycho/Social issues. Elevated A1C.  Patient unable to manage healthcare due to financial barriers.   Plan:  Referral Date: 08/01/15 Source: Dr. Viviann Spare. Green Issue: Diabetes. Patient has been falling a lot.  Screening and Initial Assessment: 08/05/2015 Telephonic RN CM:  08/05/15 Program:  DM 08/05/15  Disease Management: Multiple co-morbidities -A1C 8.4 -Hearing aid benefit review -Patient with no home scale once [facility] pulls electronic scale out of home.  -No BP cuff in the home (needs extra large cuff).   [facility] Education mailed 08/06/2015 (reviewed 09/05/15) -Diabetes: Why Get Your A1C Checked? -Diabetes: Controlling Blood Sugar -Diabetes- Preventing Heart Attack and Stroke Advanced  Directive Document mailed 08/06/2015  Fall Risk associated to ortho needs, pain, poor mobility and obesity.  -Follow-up date of last Bone Density Study testing date.  -Fracture: Ortho appt pending; previously delayed due to transportation and unable to afford co-pay. -Continue to follow patient's compliance with plan to schedule appt with his past Orthopedic MD in [facility]; plans SCAT transportation; will make arrangements once he receives SCAT ID.   -Tub bench (call PT for recommendation of bench versus shower chair)  SW Referral sent 08/05/2015 (remains active) -Community resources: Marketing executive. -Financial Assessment -Co-pay issues for  MD appt's.  -Transportation Resources - Car will not pass inspection -Psycho/Social Assessment: Depression  -Advance Directives need updating; patient to select new HCPOA.  -Dental Resources / no cost  RN CM advised patient in next [facility] scheduled contact call within the next 30 days for Telephonic Monthly Assessment  RN CM advised to please notify MD of any changes in condition prior to scheduled appt's.  RN CM provided contact name and # [phone] or main office # [phone] and 24-hour nurse line # [phone].  RN CM confirmed patient is aware of 911 services for urgent emergency needs.  Mariann Laster, RN, BSN, [facility], [place] Management Care Management Coordinator [phone] Direct [phone] Cell [phone] Office [phone] Fax [email or web address]

## 2015-09-09 ENCOUNTER — Other Ambulatory Visit: Payer: Self-pay | Admitting: Licensed Clinical Social Worker

## 2015-09-09 NOTE — Patient Outreach (Signed)
[facility]) Care Management  09/09/2015  YUSEI BILL [birth year] [id number]   Care Plan completed on 09/09/2015.  Mariann Laster, RN, BSN, [facility], [place] Management Care Management Coordinator [phone] Direct [phone] Cell [phone] Office [phone] Fax [email or web address]

## 2015-09-09 NOTE — Patient Outreach (Signed)
[place] [facility]) Care Management  09/09/2015  RIYAZ CIAK 10-20-1950 [id number]  Assessment- CSW received return voice message from Floydene Flock from [place] stating that patient was not in the system. CSW completed call back to Floydene Flock to gain additional information on what exactly this means. CSW completed call to patient to suggest that he contact SCAT but was unable to reach him as phone call continuously rang.  Plan-CSW will await to hear back from SCAT.  Eula Fried, BSW, MSW, [email or web address] Phone: [phone] Fax: [phone]

## 2015-09-10 ENCOUNTER — Other Ambulatory Visit: Payer: Self-pay | Admitting: Licensed Clinical Social Worker

## 2015-09-10 ENCOUNTER — Other Ambulatory Visit: Payer: Self-pay

## 2015-09-10 NOTE — Patient Outreach (Signed)
[facility]) Care Management  09/10/2015  Phillip Maldonado 05/01/1950 [id number]  Telephonic Care Coordination Services  Contact call to PCP:  Dr. Jeanmarie Hubert Issue:  Date of patient's last Bone Density Study testing date___________. H/o Fall Risk associated to ortho needs, pain, poor mobility and obesity.  RN CM left message on CMA line for follow-up.    Mariann Laster, RN, BSN, [facility], [place] Management Care Management Coordinator [phone] Direct [phone] Cell [phone] Office [phone] Fax [name]

## 2015-09-10 NOTE — Patient Outreach (Addendum)
[facility]) Care Management  09/10/2015  Phillip Maldonado 05/25/1950 [id number]   Assessment- CSW still has not heard back from SCAT in regards to patient's status with services. CSW completed call to SCAT but was unable to reach anyone. An additional HIPPA compliant voice message was left. CSW also sent a secure email to Floydene Flock with SCAT in order to gain updates.  Plan-CSW will await to hear back from SCAT and follow up with transportation needs.  Eula Fried, BSW, MSW, [email or web address] Phone: [phone] Fax: [phone]  Addendum- CSW received return email. Patient is not in their SCAT system and has been approved for services. He can start at anytime.

## 2015-09-10 NOTE — Patient Outreach (Signed)
[place] [facility]) Care Management  09/10/2015  KAEGEN NOVAKOVICH 1950/03/24 [id number]   Inbound call received from Dr. Rolly Salter office contact, Rodena Piety confirming no record of Bone Density Study.  RN CM requested to add note for Dr. Nyoka Cowden to review on patient's next office appt for 10/30/2015 to assess for need due to h/o multiple falls, fall risk and fractures.    Mariann Laster, RN, BSN, [facility], [place] Management Care Management Coordinator [phone] Direct [phone] Cell [phone] Office [phone] Fax [email or web address]

## 2015-09-11 ENCOUNTER — Other Ambulatory Visit: Payer: Self-pay | Admitting: Licensed Clinical Social Worker

## 2015-09-11 NOTE — Patient Outreach (Signed)
[place] [facility]) Care Management  09/11/2015  Phillip Maldonado 09-03-1950 [id number]   Assessment-CSW completed outreach attempt today. CSW unable to reach patient successfully. CSW unable to leave voice message because phone call continuously rang.  Plan-CSW will await return call or complete an additional outreach if needed.  Eula Fried, BSW, MSW, [email or web address] Phone: [phone] Fax: [phone]

## 2015-09-13 ENCOUNTER — Other Ambulatory Visit: Payer: Self-pay | Admitting: *Deleted

## 2015-09-13 ENCOUNTER — Other Ambulatory Visit: Payer: Self-pay | Admitting: Internal Medicine

## 2015-09-13 DIAGNOSIS — G5610 Other lesions of median nerve, unspecified upper limb: Secondary | ICD-10-CM

## 2015-09-13 MED ORDER — METHADONE HCL 10 MG PO TABS: ORAL_TABLET | ORAL | Status: DC

## 2015-09-13 NOTE — Telephone Encounter (Signed)
Patient requested and will pick up 

## 2015-09-19 ENCOUNTER — Other Ambulatory Visit: Payer: Self-pay | Admitting: Licensed Clinical Social Worker

## 2015-09-19 NOTE — Patient Outreach (Signed)
[facility] [facility]) Care Management  09/19/2015  Phillip Maldonado 16-May-1950 [id number]   Assessment-CSW completed second outreach attempt today to discuss community resources and approval for SCAT. CSW unable to reach patient successfully. CSW left a HIPPA compliant voice message encouraging patient to return call once available. CSW will complete next outreach call during the afternoon instead of the morning to see if this will help.  Plan-CSW will await return call or complete an additional outreach if needed.  Eula Fried, BSW, MSW, [email or web address] Phone: [phone] Fax: [phone]

## 2015-09-23 ENCOUNTER — Encounter: Payer: Self-pay | Admitting: Licensed Clinical Social Worker

## 2015-09-23 ENCOUNTER — Other Ambulatory Visit: Payer: Self-pay | Admitting: Licensed Clinical Social Worker

## 2015-09-23 NOTE — Patient Outreach (Addendum)
[facility]) Care Management  09/23/2015  Phillip Maldonado 10/13/1950 [id number]   Assessment-CSW completed outreach to patient and he answered but asked if this CSW could contact him in 10-15 minutes because he was on the other line. CSW completed call back to patient and he answered. Patient reports that he is doing very well. He shares that he has applied for job which would require some traveling but no physical activity. He states that he is really looking forward to hearing back from the company. Patient reports he has gained socialization by going to the "14th annual native [place] gathering" this weekend where he was able to talk to people, meet new friends and experience and learn about this culture. CSW reviewed other ways to gain socialization within the community by going to senior centers. CSW reviewed various non profit companies who have been known to have medical equipment. CSW reviewed mental health resources, financial resources and food resources. Patient now has SCAT and can use service as needed. Patient denies having found a mental health agency and at this time rather implement healthy coping methods into his routine, gain a job and meet new people by socialization activities rather than receive counseling at this time. Patient reports that he has not been to a food pantry but is able to go to his church and they can provide boxes of meals for him when needed. Patient denies any further social work needs at this time. He understands to keep all resource information that was provided to him during home visit in a packet that he can find if he ever needs to use these resources. CSW also strongly encouraged patient to review advance directives with family in order to complete document. Patient reports that he will do so when "the time is right." CSW thanked patient for his time.   Plan-CSW will complete social work discharge at this time. CSW will notify RNCM who is  still involved with his care. CSW will update PCP.  THN CM Care Plan Problem One        Most Recent Value   Care Plan for Problem One  Active   THN Long Term Goal (31-90 days)  Patient will be provided community resources for finances, food pantries, dental, mental, socialization and will review them and be able to read back information within 90 days   THN Long Term Goal Start Date  08/08/15   [facility] Long Term Goal Met Date  09/23/15   Interventions for Problem One Long Term Goal  CSW will complete home visit and provided various community resoruces and will review them with patient as needed. CSW will encourage patient to implement resources to benefit his overall well-being and health.   THN CM Short Term Goal #1 (0-30 days)  Patient will gain stable transportation within 30 days with SCAT   THN CM Short Term Goal #1 Start Date  08/08/15   [facility] CM Short Term Goal #1 Met Date  09/23/15   Interventions for Short Term Goal #1  Patient now has SCAT   THN CM Short Term Goal #2 (0-30 days)  Patient will discuss advance directives and end of life planning with daughter within 30 days   THN CM Short Term Goal #2 Start Date  08/08/15   Interventions for Short Term Goal #2  Goal not met.      Eula Fried, BSW, MSW, [email or web address] Phone: [phone] Fax: [phone]

## 2015-09-26 ENCOUNTER — Encounter: Payer: Self-pay | Admitting: Nurse Practitioner

## 2015-09-26 ENCOUNTER — Ambulatory Visit (INDEPENDENT_AMBULATORY_CARE_PROVIDER_SITE_OTHER): Payer: Medicare Other | Admitting: Nurse Practitioner

## 2015-09-26 VITALS — BP 134/84 | HR 71 | Temp 98.2°F | Resp 17 | Ht 69.0 in | Wt 390.8 lb

## 2015-09-26 DIAGNOSIS — S30810A Abrasion of lower back and pelvis, initial encounter: Secondary | ICD-10-CM | POA: Diagnosis not present

## 2015-09-26 NOTE — Patient Instructions (Signed)
To follow up in office with [name] next week on Monday or Tuesday for follow up on excoriation

## 2015-09-26 NOTE — Progress Notes (Signed)
Patient ID: Phillip Maldonado, male   DOB: 04/07/1950, 65 y.o.   MRN: [id number]    PCP: Jeanmarie Hubert, MD  Advanced Directive information Does patient have an advance directive?: Yes, Type of Advance Directive: [place];Living will, Does patient want to make changes to advanced directive?: No - Patient declined  Allergies  Allergen Reactions  . Adhesive [Tape] Other (See Comments)    Unknown paper tape ok to use   . Feldene [Piroxicam] Other (See Comments)    unknown  . Latex Other (See Comments)    Rash and itching    Chief Complaint  Patient presents with  . Acute Visit    Pt has a sore on each buttock x 1 week. There has been some drainage and pain. Tried using Nystatin and Kenolog cream and vasoline.      HPI: Patient is a 65 y.o. male seen in the office today due to pain in buttocks. 1 week ago went to an outdoors event. Was sitting on a lawn chair standing up and siting down a lot. Was very hot and sweaty. Afterwards noticed increase in pain when sitting down. Can not look back there, unable to apply ointment effectively. Has been using nystatin and kenalog. No fever or chills. Clear drainage.    Review of Systems:  Review of Systems  Constitutional: Negative for fever, chills, activity change, appetite change and fatigue.  Respiratory: Negative for shortness of breath.   Cardiovascular: Negative for chest pain, palpitations and leg swelling.  Endocrine:       Diabetic   Skin: Positive for color change and wound (open area to buttock).    Past Medical History  Diagnosis Date  . CHF (congestive heart failure) ([place])   . Hypertension   . Anemia   . Angina   . Shortness of breath   . Anxiety   . Blood transfusion     last 10' 14- "GI bleed"  . Headache(784.0)   . Depression   . Hepatitis B   . Neuromuscular disorder ([place])   . Arthritis   . Diabetes mellitus ([place]) 09/18/2011  . Atrial fibrillation with RVR ([place]) 01/24/2013  . Renal  insufficiency   . DJD (degenerative joint disease)   . History of alcohol abuse     last drink in 1993  . Obesity   . Diverticulosis   . Fatty liver   . Sleep apnea, obstructive     does where bipap. 06-04-14 "doesn't use much now, no mask/tubing now.  . Pneumonia     not at present time  . Edema extremities     bilateral lower extremities-"weepy areas due to fluid retention"  . Fundic gland polyps of stomach, benign   . DM type 2, uncontrolled, with neuropathy ([place]) 12/26/2013  . 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 Obesity, morbid ([place]) 07/30/2015   Past Surgical History  Procedure Laterality Date  . Gastric bypass  1977    Reversed in 1992 and revision 1994  . Diagnostic laparoscopy    . Esophagoscopy N/A 01/01/2013    Procedure: ESOPHAGOSCOPY;  Surgeon: Jeryl Columbia, MD;  Location: [place];  Service: Endoscopy;  Laterality: N/A;  . Esophagogastroduodenoscopy N/A 01/03/2013    Procedure: ESOPHAGOGASTRODUODENOSCOPY (EGD);  Surgeon: Arta Silence, MD;  [facility];  Service: Endoscopy;  Laterality: N/A;  . Esophagogastroduodenoscopy N/A 01/03/2013    Procedure: ESOPHAGOGASTRODUODENOSCOPY (EGD);  Surgeon: Arta Silence, MD;  Location: [facility];  Service: Endoscopy;  Laterality:  N/A;  . Esophagogastroduodenoscopy N/A 01/23/2013    Procedure: ESOPHAGOGASTRODUODENOSCOPY (EGD);  Surgeon: Lear Ng, MD;  Location: [facility];  Service: Endoscopy;  Laterality: N/A;  . Esophagogastroduodenoscopy Left 01/27/2013    Procedure: ESOPHAGOGASTRODUODENOSCOPY (EGD);  Surgeon: Lear Ng, MD;  Location: [place];  Service: Endoscopy;  Laterality: Left;  . Knee surgery Left 2004  . Hydrocele excision  1996    x 2   . Abdominal surgery      [phone]  . Colonoscopy    . Back surgery      x 8 -,multiple fusions(cervical to lumbar)  . Colonoscopy with propofol N/A 06/14/2014    Procedure: COLONOSCOPY WITH PROPOFOL;  Surgeon: Gatha Mayer, MD;   [facility];  Service: Endoscopy;  Laterality: N/A;  . Esophagogastroduodenoscopy N/A 06/14/2014    Procedure: ESOPHAGOGASTRODUODENOSCOPY (EGD);  Surgeon: Gatha Mayer, MD;  Location: [facility];  Service: Endoscopy;  Laterality: N/A;   Social History:   reports that he has never smoked. He has never used smokeless tobacco. He reports that he does not drink alcohol or use illicit drugs.  Family History  Problem Relation Age of Onset  . Heart disease Father   . Skin cancer Father   . COPD Father   . Hypertension Father   . Heart disease Maternal Uncle   . High blood pressure Sister   . High blood pressure Sister   . High blood pressure Son   . Diabetes Daughter   . Depression Son   . Depression Son   . Stroke Father   . Arthritis Mother   . Arthritis Sister   . Arthritis Sister   . Osteoporosis Mother   . Cancer Maternal Grandmother   . Heart disease Maternal Uncle   . Heart disease Paternal Uncle     Medications: Patient's Medications  New Prescriptions   No medications on file  Previous Medications   ACETAMINOPHEN (TYLENOL) 650 MG CR TABLET    Take 1,950 mg by mouth 2 (two) times daily.   AMLODIPINE (NORVASC) 10 MG TABLET    Take 10 mg by mouth daily.   ATORVASTATIN (LIPITOR) 40 MG TABLET    take 1 tablet once daily   DULOXETINE (CYMBALTA) 60 MG CAPSULE    One twice daily to help neuropathy   GLUCOSE BLOOD (ACCU-CHEK AVIVA PLUS) TEST STRIP    1 each by Other route See admin instructions. Check blood sugar twice daily   INSULIN PEN NEEDLE (NOVOFINE) 30G X 8 MM MISC    Inject 1 packet into the skin See admin instructions. Use with insulin once daily.   LANTUS SOLOSTAR 100 UNIT/ML SOLOSTAR PEN    inject 22-24 UNITS EVERY MORNING   LISINOPRIL (PRINIVIL,ZESTRIL) 20 MG TABLET    TAKE ONE TABLET DAILY to control blood pressure and strengthen the heart   LUBIPROSTONE (AMITIZA) 24 MCG CAPSULE    Take 1 capsule (24 mcg total) by mouth 2 (two) times daily with a meal.     LYRICA 100 MG CAPSULE    Take 100 mg by mouth 3 (three) times daily. In addition to prn dose   LYRICA 50 MG CAPSULE    TAKE ONE CAPSULE THREE TIMES DAILY AS NEEDED FOR PAIN   METFORMIN (GLUCOPHAGE) 1000 MG TABLET    Take 1 tablet (1,000 mg total) by mouth 2 (two) times daily with a meal.   METHADONE (DOLOPHINE) 10 MG TABLET    Take One tablet by mouth 4 times daily  to  control pain   METOPROLOL (LOPRESSOR) 50 MG TABLET    One twice daily to lower BP and to regulate heart rate   MULTIPLE VITAMIN (MULITIVITAMIN WITH MINERALS) TABS    Take 1 tablet by mouth daily.   NAPROXEN SODIUM (ALEVE) 220 MG TABLET    Take two twice daily to help pain   NYSTATIN CREAM (MYCOSTATIN)    Apply twice daily to rash   POLYETHYLENE GLYCOL (MIRALAX / GLYCOLAX) PACKET    Take 17 g by mouth daily as needed for mild constipation or moderate constipation.   TORSEMIDE (DEMADEX) 100 MG TABLET    Take 1 tablet (100 mg total) by mouth 2 (two) times daily.   TRAZODONE (DESYREL) 100 MG TABLET    Take three tablets by mouth at bedtime for rest   TRIAMCINOLONE CREAM (KENALOG) 0.1 %    Apply sparingly up to twice daily to rash until it is resolved  Modified Medications   No medications on file  Discontinued Medications   PANTOPRAZOLE (PROTONIX) 40 MG TABLET    TAKE 1 TABLET TWICE DAILY   PREGABALIN (LYRICA) 150 MG CAPSULE    Take one capsule 3 times daily to help neuropathic pains     Physical Exam:  Filed Vitals:   09/26/15 1145  BP: 134/84  Pulse: 71  Temp: 98.2 F (36.8 C)  TempSrc: Oral  Resp: 17  Height: 5\' 9"  (1.753 m)  Weight: 390 lb 12.8 oz (177.266 kg)  SpO2: 94%   Body mass index is 57.68 kg/(m^2).  Physical Exam  Constitutional: He is oriented to person, place, and time. He appears well-developed and well-nourished. No distress.  Morbid obesity  Cardiovascular: Normal rate and regular rhythm.  Exam reveals no gallop.   Pulmonary/Chest: Effort normal and breath sounds normal.  Musculoskeletal: He  exhibits edema. He exhibits no tenderness.  Using walker. Unstable gait. Tendeer left ankle.  Neurological: He is alert and oriented to person, place, and time. He has normal reflexes. No cranial nerve deficit. Coordination normal.  08/15/14 MMSE 27/30 09/06/2014  PNCV showed severe bilateral median nerve neuropathy consistent with severe carpal tunnel syndrome.  Skin: No pallor.  Chronic venous stasis changes of both anterior legs.  Linear 2 cm long open area of excoriation of bilateral buttocks, clear drainage   Psychiatric: He has a normal mood and affect.    Labs reviewed: Basic Metabolic Panel:  Recent Labs  10/11/14 0833 02/12/15 1424 07/30/15 0920  NA 142 139 140  K 3.8 4.5 4.4  CL 95* 98 97*  CO2 28 28 31   GLUCOSE 210* 145* 156*  BUN 18 15 26*  CREATININE 1.05 0.77 1.26*  CALCIUM 9.8 9.6 9.6   Liver Function Tests:  Recent Labs  10/11/14 0833 07/30/15 0920  AST 26 45*  ALT 22 28  ALKPHOS 69 57  BILITOT 0.4 0.4  PROT 6.7 7.2  ALBUMIN 3.8 3.4*   No results for input(s): LIPASE, AMYLASE in the last 8760 hours. No results for input(s): AMMONIA in the last 8760 hours. CBC:  Recent Labs  10/11/14 0833 02/12/15 1424 07/30/15 0920  WBC 8.3 6.4 8.1  NEUTROABS 5.3 3.8 5.0  HGB  --   --  12.5*  HCT 37.5 37.9 40.4  MCV 89 84 89.8  PLT  --   --  193   Lipid Panel:  Recent Labs  02/12/15 1424  CHOL 223*  HDL 38*  LDLCALC 116*  TRIG 347*  CHOLHDL 5.9*   TSH: No results for  input(s): TSH in the last 8760 hours. A1C: Lab Results  Component Value Date   HGBA1C 8.4* 02/12/2015     Assessment/Plan 1. Excoriation of buttock, initial encounter duoderm applied to bilateral buttocks, no signs of infection -to follow up in 5 days for follow up and recheck -to seek attention sooner if increase pain, drainage or fever to occur   Morissa Obeirne K. Harle Battiest  [facility] & Adult Medicine [phone] 8 am - 5 pm) [phone] (after  hours)

## 2015-09-28 ENCOUNTER — Other Ambulatory Visit: Payer: Self-pay | Admitting: Internal Medicine

## 2015-10-01 ENCOUNTER — Ambulatory Visit: Payer: Medicare Other | Admitting: Nurse Practitioner

## 2015-10-01 ENCOUNTER — Encounter: Payer: Self-pay | Admitting: Internal Medicine

## 2015-10-03 ENCOUNTER — Encounter: Payer: Self-pay | Admitting: Internal Medicine

## 2015-10-03 ENCOUNTER — Ambulatory Visit: Payer: Self-pay

## 2015-10-04 ENCOUNTER — Other Ambulatory Visit: Payer: Self-pay

## 2015-10-04 NOTE — Patient Outreach (Signed)
[facility]) Care Management  10/04/2015  ADYSON BUZEK 05-Feb-1951 [id number]   Telephonic Monthly Assessment   Outreach call #1 to patient.  Patient not reached following several rings.  RN CM unable to leave message.  RN CM scheduled for next outreach call within one week.   Nathaneil Canary, BSN, RN, [place] Management Care Management Coordinator [phone] Direct [phone] Cell [phone] Office [phone] Fax [email or web address]

## 2015-10-09 ENCOUNTER — Other Ambulatory Visit: Payer: Self-pay

## 2015-10-09 NOTE — Patient Outreach (Signed)
[facility]) Care Management  10/09/2015  FRANKLYN BARSH [birth year] [id number]  Telephonic Monthly Assessment   Outreach call #2.  Patient not reached.   RN CM left HIPAA compliant voice message with name and number for call back at [phone] Additional outreach call to other home # [phone] .  No answer at this #.   Plan RN CM scheduled for next outreach call within one week.   Nathaneil Canary, BSN, RN, [place] Management Care Management Coordinator [phone] Direct [phone] Cell [phone] Office [phone] Fax [email or web address]

## 2015-10-14 ENCOUNTER — Other Ambulatory Visit: Payer: Self-pay | Admitting: *Deleted

## 2015-10-14 ENCOUNTER — Ambulatory Visit: Payer: Self-pay

## 2015-10-14 ENCOUNTER — Other Ambulatory Visit: Payer: Self-pay | Admitting: Internal Medicine

## 2015-10-14 DIAGNOSIS — G5610 Other lesions of median nerve, unspecified upper limb: Secondary | ICD-10-CM

## 2015-10-14 MED ORDER — METHADONE HCL 10 MG PO TABS: ORAL_TABLET | ORAL | 0 refills | Status: DC

## 2015-10-14 NOTE — Telephone Encounter (Signed)
Spoke with patient and advised rx ready for pick-up and it will be at the front desk.  

## 2015-10-14 NOTE — Telephone Encounter (Signed)
Patient requested and will pick up 

## 2015-10-15 ENCOUNTER — Other Ambulatory Visit: Payer: Self-pay

## 2015-10-15 NOTE — Patient Outreach (Signed)
[facility]) Care Management  10/15/2015  CHAZ MENNA January 17, 1951 [id number]   Telephonic Monthly Assessment   Outreach call #3.  Patient not reached.   No answer following multiple rings.   Plan RN CM mailed unsuccessful outreach letter to patient.  RN CM will review for response within 2 weeks and close case if no response.    Nathaneil Canary, BSN, RN, [place] Management Care Management Coordinator [phone] Direct [phone] Cell [phone] Office [phone] Fax [email or web address]

## 2015-10-23 NOTE — Addendum Note (Signed)
Addended by: Logan Bores on: 10/23/2015 02:47 PM   Modules accepted: Orders

## 2015-10-25 ENCOUNTER — Other Ambulatory Visit: Payer: Self-pay | Admitting: *Deleted

## 2015-10-25 MED ORDER — PANTOPRAZOLE SODIUM 40 MG PO TBEC
40.0000 mg | DELAYED_RELEASE_TABLET | Freq: Two times a day (BID) | ORAL | 1 refills | Status: DC
Start: 2015-10-25 — End: 2015-11-27

## 2015-10-25 NOTE — Telephone Encounter (Signed)
Patient requested his Protonix be called into pharmacy, that he is about out.

## 2015-10-28 ENCOUNTER — Other Ambulatory Visit: Payer: Self-pay

## 2015-10-30 ENCOUNTER — Other Ambulatory Visit: Payer: Self-pay

## 2015-10-30 ENCOUNTER — Ambulatory Visit: Payer: Self-pay | Admitting: Internal Medicine

## 2015-10-30 NOTE — Patient Outreach (Signed)
[facility]) Care Management  10/30/2015  VISHAN GALLERANI 13-Sep-1950 [id number]   Case Closure  Patient unable to reach with no response back to phone messages or unsuccessful outreach letter.   Case closed to [facility] services.   Plan:  RN CM will notify MD via closure letter notification RN CM will notify patient via case closure letter.   Nathaneil Canary, BSN, RN, [place] Management Care Management Coordinator [phone] Direct [phone] Cell [phone] Office [phone] Fax [email or web address]

## 2015-11-15 ENCOUNTER — Other Ambulatory Visit: Payer: Self-pay | Admitting: Internal Medicine

## 2015-11-15 ENCOUNTER — Other Ambulatory Visit: Payer: Self-pay | Admitting: *Deleted

## 2015-11-15 DIAGNOSIS — G5610 Other lesions of median nerve, unspecified upper limb: Secondary | ICD-10-CM

## 2015-11-15 MED ORDER — METHADONE HCL 10 MG PO TABS: ORAL_TABLET | ORAL | 0 refills | Status: DC

## 2015-11-15 NOTE — Telephone Encounter (Signed)
Phone just rang, no answer and no VM, will leave up front for patient to pick-up

## 2015-11-15 NOTE — Telephone Encounter (Signed)
Patient requested and will pick up 

## 2015-11-16 ENCOUNTER — Other Ambulatory Visit: Payer: Self-pay | Admitting: Internal Medicine

## 2015-11-27 ENCOUNTER — Other Ambulatory Visit: Payer: Self-pay | Admitting: Internal Medicine

## 2015-12-02 ENCOUNTER — Ambulatory Visit (INDEPENDENT_AMBULATORY_CARE_PROVIDER_SITE_OTHER): Payer: Medicare Other | Admitting: Nurse Practitioner

## 2015-12-02 ENCOUNTER — Encounter: Payer: Self-pay | Admitting: Nurse Practitioner

## 2015-12-02 VITALS — BP 132/84 | HR 73 | Temp 97.9°F | Resp 17 | Ht 69.0 in | Wt 399.4 lb

## 2015-12-02 DIAGNOSIS — L03115 Cellulitis of right lower limb: Secondary | ICD-10-CM

## 2015-12-02 DIAGNOSIS — R609 Edema, unspecified: Secondary | ICD-10-CM | POA: Diagnosis not present

## 2015-12-02 DIAGNOSIS — I509 Heart failure, unspecified: Secondary | ICD-10-CM

## 2015-12-02 DIAGNOSIS — Z23 Encounter for immunization: Secondary | ICD-10-CM

## 2015-12-02 LAB — CBC WITH DIFFERENTIAL/PLATELET
Basophils Absolute: 75 cells/uL (ref 0–200)
Basophils Relative: 1 %
EOS PCT: 5 %
Eosinophils Absolute: 375 cells/uL (ref 15–500)
HCT: 37.8 % — ABNORMAL LOW (ref 38.5–50.0)
HEMOGLOBIN: 12.4 g/dL — AB (ref 13.2–17.1)
LYMPHS ABS: 2775 {cells}/uL (ref 850–3900)
Lymphocytes Relative: 37 %
MCH: 28.9 pg (ref 27.0–33.0)
MCHC: 32.8 g/dL (ref 32.0–36.0)
MCV: 88.1 fL (ref 80.0–100.0)
MPV: 10.3 fL (ref 7.5–12.5)
Monocytes Absolute: 525 cells/uL (ref 200–950)
Monocytes Relative: 7 %
NEUTROS ABS: 3750 {cells}/uL (ref 1500–7800)
Neutrophils Relative %: 50 %
Platelets: 229 10*3/uL (ref 140–400)
RBC: 4.29 MIL/uL (ref 4.20–5.80)
RDW: 15.7 % — ABNORMAL HIGH (ref 11.0–15.0)
WBC: 7.5 10*3/uL (ref 3.8–10.8)

## 2015-12-02 MED ORDER — DOXYCYCLINE HYCLATE 100 MG PO TABS: 100.0000 mg | ORAL_TABLET | Freq: Two times a day (BID) | ORAL | 0 refills | Status: DC

## 2015-12-02 NOTE — Progress Notes (Signed)
Careteam: Patient Care Team: Estill Dooms, MD as PCP - General (Internal Medicine)  Advanced Directive information Does patient have an advance directive?: Yes, Type of Advance Directive: [place];Living will  Allergies  Allergen Reactions  . Adhesive [Tape] Other (See Comments)    Unknown paper tape ok to use   . Feldene [Piroxicam] Other (See Comments)    unknown  . Latex Other (See Comments)    Rash and itching    Chief Complaint  Patient presents with  . Acute Visit    Both legs have been swelling and weeping for 1-2 weeks.   Marland Kitchen other    Wants flu vaccine today.      HPI: Patient is a 65 y.o. male seen in the office today due to worsening swelling to lower legs. Pt with hx of CHF, has not seen cardiologist in over 2 years  Taking demadex 100 mg twice daily but does not feel like he is making much urine. trys to stay hydrated and drinks a lot of fluids  Reports he has had to use wraps before to get the swelling to go down.  ABIs done in 2016 which were normal.   Sleeps in a recliner due to pain from prior back surgery Can not sleep comfortable in bed  Had sausage biscuit for breakfast, ham and cheese for dinner last night. No lunch today as of 3:30.   Per epic records he was hospitalized back in summer for 2016 and had acute CHF, orders placed for cardiologist but he did not follow up.    Review of Systems:  Review of Systems  Constitutional: Negative for activity change, appetite change and fever.  Respiratory: Positive for cough (occasional cough). Negative for shortness of breath.   Cardiovascular: Positive for leg swelling. Negative for chest pain and palpitations.  Gastrointestinal: Negative for abdominal distention.  Genitourinary: Negative for difficulty urinating and dysuria.  Musculoskeletal: Positive for arthralgias and back pain.  Skin: Positive for color change.       Chronic venous stasis changes of both lower legs, right leg  with increase redness vs left  Neurological: Positive for numbness.    Past Medical History:  Diagnosis Date  . Anemia   . Angina   . Anxiety   . Arthritis   . Atrial fibrillation with RVR ([place]) 01/24/2013  . Blood transfusion    last 10' 14- "GI bleed"  . CHF (congestive heart failure) ([place])   . Depression   . Diabetes mellitus ([place]) 09/18/2011  . Diverticulosis   . DJD (degenerative joint disease)   . DM type 2, uncontrolled, with neuropathy ([place]) 12/26/2013  . Edema extremities    bilateral lower extremities-"weepy areas due to fluid retention"  . Fatty liver   . Fundic gland polyps of stomach, benign   . Headache(784.0)   . Hepatitis B   . History of alcohol abuse    last drink in 1993  . Hypertension   . Neuromuscular disorder ([place])   . 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 Obesity   . Obesity, morbid ([place]) 07/30/2015  . Pneumonia    not at present time  . Renal insufficiency   . Shortness of breath   . Sleep apnea, obstructive    does where bipap. 06-04-14 "doesn't use much now, no mask/tubing now.   Past Surgical History:  Procedure Laterality Date  . ABDOMINAL SURGERY     [phone]  . BACK SURGERY  x 8 -,multiple fusions(cervical to lumbar)  . COLONOSCOPY    . COLONOSCOPY WITH PROPOFOL N/A 06/14/2014   Procedure: COLONOSCOPY WITH PROPOFOL;  Surgeon: Gatha Mayer, MD;  [facility];  Service: Endoscopy;  Laterality: N/A;  . DIAGNOSTIC LAPAROSCOPY    . ESOPHAGOGASTRODUODENOSCOPY N/A 01/03/2013   Procedure: ESOPHAGOGASTRODUODENOSCOPY (EGD);  Surgeon: Arta Silence, MD;  [facility];  Service: Endoscopy;  Laterality: N/A;  . ESOPHAGOGASTRODUODENOSCOPY N/A 01/03/2013   Procedure: ESOPHAGOGASTRODUODENOSCOPY (EGD);  Surgeon: Arta Silence, MD;  Location: [facility];  Service: Endoscopy;  Laterality: N/A;  . ESOPHAGOGASTRODUODENOSCOPY N/A 01/23/2013   Procedure: ESOPHAGOGASTRODUODENOSCOPY (EGD);  Surgeon: Lear Ng,  MD;  Location[facility];  Service: Endoscopy;  Laterality: N/A;  . ESOPHAGOGASTRODUODENOSCOPY Left 01/27/2013   Procedure: ESOPHAGOGASTRODUODENOSCOPY (EGD);  Surgeon: Lear Ng, MD;  Location: [place];  Service: Endoscopy;  Laterality: Left;  . ESOPHAGOGASTRODUODENOSCOPY N/A 06/14/2014   Procedure: ESOPHAGOGASTRODUODENOSCOPY (EGD);  Surgeon: Gatha Mayer, MD;  Location: [facility];  Service: Endoscopy;  Laterality: N/A;  . ESOPHAGOSCOPY N/A 01/01/2013   Procedure: ESOPHAGOSCOPY;  Surgeon: Jeryl Columbia, MD;  Location: [place];  Service: Endoscopy;  Laterality: N/A;  . GASTRIC BYPASS  1977   Reversed in 1992 and revision 1994  . HYDROCELE EXCISION  1996   x 2   . KNEE SURGERY Left 2004   Social History:   reports that he has never smoked. He has never used smokeless tobacco. He reports that he does not drink alcohol or use drugs.  Family History  Problem Relation Age of Onset  . Heart disease Father   . Skin cancer Father   . COPD Father   . Hypertension Father   . Stroke Father   . Arthritis Mother   . Osteoporosis Mother   . Diabetes Daughter   . Depression Son   . Heart disease Maternal Uncle   . High blood pressure Sister   . High blood pressure Sister   . High blood pressure Son   . Depression Son   . Arthritis Sister   . Arthritis Sister   . Cancer Maternal Grandmother   . Heart disease Maternal Uncle   . Heart disease Paternal Uncle     Medications: Patient's Medications  New Prescriptions   No medications on file  Previous Medications   ACETAMINOPHEN (TYLENOL) 650 MG CR TABLET    Take 1,950 mg by mouth 2 (two) times daily.   AMLODIPINE (NORVASC) 10 MG TABLET    Take 10 mg by mouth daily.   ATORVASTATIN (LIPITOR) 40 MG TABLET    take 1 tablet once daily   DULOXETINE (CYMBALTA) 60 MG CAPSULE    One twice daily to help neuropathy   GLUCOSE BLOOD (ACCU-CHEK AVIVA PLUS) TEST STRIP    1 each by Other route See admin instructions. Check blood sugar twice  daily   INSULIN PEN NEEDLE (NOVOFINE) 30G X 8 MM MISC    Inject 1 packet into the skin See admin instructions. Use with insulin once daily.   LANTUS SOLOSTAR 100 UNIT/ML SOLOSTAR PEN    inject 22-24 UNITS EVERY MORNING   LISINOPRIL (PRINIVIL,ZESTRIL) 20 MG TABLET    TAKE ONE TABLET DAILY to control blood pressure and strengthen the heart   LUBIPROSTONE (AMITIZA) 24 MCG CAPSULE    Take 1 capsule (24 mcg total) by mouth 2 (two) times daily with a meal.   LYRICA 150 MG CAPSULE    Take 1 capsule by mouth 3 (three) times daily.  METFORMIN (GLUCOPHAGE) 1000 MG TABLET    Take 1 tablet (1,000 mg total) by mouth 2 (two) times daily with a meal.   METHADONE (DOLOPHINE) 10 MG TABLET    Take One tablet by mouth 4 times daily  to control pain   METOPROLOL (LOPRESSOR) 50 MG TABLET    TAKE ONE TABLET TWICE DAILY   MULTIPLE VITAMIN (MULITIVITAMIN WITH MINERALS) TABS    Take 1 tablet by mouth daily.   NYSTATIN CREAM (MYCOSTATIN)    Apply twice daily to rash   PANTOPRAZOLE (PROTONIX) 40 MG TABLET    TAKE 1 TABLET TWICE DAILY   POLYETHYLENE GLYCOL (MIRALAX / GLYCOLAX) PACKET    Take 17 g by mouth daily as needed for mild constipation or moderate constipation.   TORSEMIDE (DEMADEX) 100 MG TABLET    Take 1 tablet (100 mg total) by mouth 2 (two) times daily.   TRAZODONE (DESYREL) 100 MG TABLET    Take 3 tablets by mouth at bedtime.   TRIAMCINOLONE CREAM (KENALOG) 0.1 %    Apply sparingly up to twice daily to rash until it is resolved  Modified Medications   No medications on file  Discontinued Medications   LYRICA 100 MG CAPSULE    Take 100 mg by mouth 3 (three) times daily. In addition to prn dose   LYRICA 50 MG CAPSULE    TAKE ONE CAPSULE THREE TIMES DAILY AS NEEDED FOR PAIN   NAPROXEN SODIUM (ALEVE) 220 MG TABLET    Take two twice daily to help pain     Physical Exam:  Vitals:   12/02/15 1505  BP: 132/84  Pulse: 73  Resp: 17  Temp: 97.9 F (36.6 C)  TempSrc: Oral  SpO2: 94%  Weight: (!) 399 lb 6.4  oz (181.2 kg)  Height: 5' 9" (1.753 m)   Body mass index is 58.98 kg/m.  Physical Exam  Constitutional: He is oriented to person, place, and time. He appears well-developed and well-nourished. No distress.  Morbid obesity  Cardiovascular: Normal rate and regular rhythm.  Exam reveals no gallop.   Pulmonary/Chest: Effort normal and breath sounds normal.  Musculoskeletal: He exhibits edema (1-2+pitting edema). He exhibits no tenderness.  Neurological: He is alert and oriented to person, place, and time.  08/15/14 MMSE 27/30 09/06/2014  PNCV showed severe bilateral median nerve neuropathy consistent with severe carpal tunnel syndrome.  Skin: No pallor.  Chronic venous stasis changes of both anterior legs. Right LE with increase redness vs left  Psychiatric: He has a normal mood and affect.    Labs reviewed: Basic Metabolic Panel:  Recent Labs  02/12/15 1424 07/30/15 0920  NA 139 140  K 4.5 4.4  CL 98 97*  CO2 28 31  GLUCOSE 145* 156*  BUN 15 26*  CREATININE 0.77 1.26*  CALCIUM 9.6 9.6   Liver Function Tests:  Recent Labs  07/30/15 0920  AST 45*  ALT 28  ALKPHOS 57  BILITOT 0.4  PROT 7.2  ALBUMIN 3.4*   No results for input(s): LIPASE, AMYLASE in the last 8760 hours. No results for input(s): AMMONIA in the last 8760 hours. CBC:  Recent Labs  02/12/15 1424 07/30/15 0920  WBC 6.4 8.1  NEUTROABS 3.8 5.0  HGB  --  12.5*  HCT 37.9 40.4  MCV 84 89.8  PLT  --  193   Lipid Panel:  Recent Labs  02/12/15 1424  CHOL 223*  HDL 38*  LDLCALC 116*  TRIG 347*  CHOLHDL 5.9*   TSH:  No results for input(s): TSH in the last 8760 hours. A1C: Lab Results  Component Value Date   HGBA1C 8.4 (H) 02/12/2015     Assessment/Plan 1. Encounter for immunization. - Flu Vaccine QUAD 36+ mos IM  2. Chronic congestive heart failure, unspecified congestive heart failure type ([place]) -on Demadex 100 mg BID -has not followed up with cardiologist, will place referral  -  CMP with eGFR - CBC with Differential/Platelets - Ambulatory referral to Cardiology  3. Edema, unspecified type Worsening LE edema -cont on demadex 100 mg BID To elevate leg Low sodium diet Pt unable to place compression hose. Will have [facility] nursing to place wraps for swelling  - CMP with eGFR - CBC with Differential/Platelets  4. Cellulitis of leg, right -right leg with more redness and warmth than left. Will treat for cellulitis. [place] nursing to come out for monitoring and wraps  - doxycycline (VIBRA-TABS) 100 MG tablet; Take 1 tablet (100 mg total) by mouth 2 (two) times daily.  Dispense: 14 tablet; Refill: 0 - Ambulatory referral to Home Health   To follow up with Dr Nyoka Cowden in 4 weeks, sooner if needed  Jessica K. Harle Battiest  [facility] & Adult Medicine [phone] 8 am - 5 pm) [phone] (after hours)

## 2015-12-02 NOTE — Patient Instructions (Signed)
Elevate legs as tolerates  Low sodium diet.  DASH Eating Plan DASH stands for "Dietary Approaches to Stop Hypertension." The DASH eating plan is a healthy eating plan that has been shown to reduce high blood pressure (hypertension). Additional health benefits may include reducing the risk of type 2 diabetes mellitus, heart disease, and stroke. The DASH eating plan may also help with weight loss. WHAT DO I NEED TO KNOW ABOUT THE DASH EATING PLAN? For the DASH eating plan, you will follow these general guidelines:  Choose foods with a percent daily value for sodium of less than 5% (as listed on the food label).  Use salt-free seasonings or herbs instead of table salt or sea salt.  Check with your health care provider or pharmacist before using salt substitutes.  Eat lower-sodium products, often labeled as "lower sodium" or "no salt added."  Eat fresh foods.  Eat more vegetables, fruits, and low-fat dairy products.  Choose whole grains. Look for the word "whole" as the first word in the ingredient list.  Choose fish and skinless chicken or [place] more often than red meat. Limit fish, poultry, and meat to 6 oz (170 g) each day.  Limit sweets, desserts, sugars, and sugary drinks.  Choose heart-healthy fats.  Limit cheese to 1 oz (28 g) per day.  Eat more home-cooked food and less restaurant, buffet, and fast food.  Limit fried foods.  Cook foods using methods other than frying.  Limit canned vegetables. If you do use them, rinse them well to decrease the sodium.  When eating at a restaurant, ask that your food be prepared with less salt, or no salt if possible. WHAT FOODS CAN I EAT? Seek help from a dietitian for individual calorie needs. Grains Whole grain or whole wheat bread. Brown rice. Whole grain or whole wheat pasta. Quinoa, bulgur, and whole grain cereals. Low-sodium cereals. Corn or whole wheat flour tortillas. Whole grain cornbread. Whole grain crackers. Low-sodium  crackers. Vegetables Fresh or frozen vegetables (raw, steamed, roasted, or grilled). Low-sodium or reduced-sodium tomato and vegetable juices. Low-sodium or reduced-sodium tomato sauce and paste. Low-sodium or reduced-sodium canned vegetables.  Fruits All fresh, canned (in natural juice), or frozen fruits. Meat and Other Protein Products Ground beef (85% or leaner), grass-fed beef, or beef trimmed of fat. Skinless chicken or [place]. Ground chicken or [place]. Pork trimmed of fat. All fish and seafood. Eggs. Dried beans, peas, or lentils. Unsalted nuts and seeds. Unsalted canned beans. Dairy Low-fat dairy products, such as skim or 1% milk, 2% or reduced-fat cheeses, low-fat ricotta or cottage cheese, or plain low-fat yogurt. Low-sodium or reduced-sodium cheeses. Fats and Oils Tub margarines without trans fats. Light or reduced-fat mayonnaise and salad dressings (reduced sodium). Avocado. Safflower, olive, or canola oils. Natural peanut or almond butter. Other Unsalted popcorn and pretzels. The items listed above may not be a complete list of recommended foods or beverages. Contact your dietitian for more options. WHAT FOODS ARE NOT RECOMMENDED? Grains White bread. White pasta. White rice. Refined cornbread. Bagels and croissants. Crackers that contain trans fat. Vegetables Creamed or fried vegetables. Vegetables in a cheese sauce. Regular canned vegetables. Regular canned tomato sauce and paste. Regular tomato and vegetable juices. Fruits Dried fruits. Canned fruit in light or heavy syrup. Fruit juice. Meat and Other Protein Products Fatty cuts of meat. Ribs, chicken wings, bacon, sausage, bologna, salami, chitterlings, fatback, hot dogs, bratwurst, and packaged luncheon meats. Salted nuts and seeds. Canned beans with salt. Dairy Whole or 2% milk, cream,  half-and-half, and cream cheese. Whole-fat or sweetened yogurt. Full-fat cheeses or blue cheese. Nondairy creamers and whipped toppings.  Processed cheese, cheese spreads, or cheese curds. Condiments Onion and garlic salt, seasoned salt, table salt, and sea salt. Canned and packaged gravies. Worcestershire sauce. Tartar sauce. Barbecue sauce. Teriyaki sauce. Soy sauce, including reduced sodium. Steak sauce. Fish sauce. Oyster sauce. Cocktail sauce. Horseradish. Ketchup and mustard. Meat flavorings and tenderizers. Bouillon cubes. Hot sauce. Tabasco sauce. Marinades. Taco seasonings. Relishes. Fats and Oils Butter, stick margarine, lard, shortening, ghee, and bacon fat. Coconut, palm kernel, or palm oils. Regular salad dressings. Other Pickles and olives. Salted popcorn and pretzels. The items listed above may not be a complete list of foods and beverages to avoid. Contact your dietitian for more information. WHERE CAN I FIND MORE INFORMATION? National Heart, Lung, and Blood Institute: travelstabloid.com   This information is not intended to replace advice given to you by your health care provider. Make sure you discuss any questions you have with your health care provider.   Document Released: 02/19/2011 Document Revised: 03/23/2014 Document Reviewed: 01/04/2013 Elsevier Interactive Patient Education [employer].

## 2015-12-03 LAB — COMPLETE METABOLIC PANEL WITH GFR
ALBUMIN: 3.7 g/dL (ref 3.6–5.1)
ALK PHOS: 53 U/L (ref 40–115)
ALT: 16 U/L (ref 9–46)
AST: 25 U/L (ref 10–35)
BILIRUBIN TOTAL: 0.3 mg/dL (ref 0.2–1.2)
BUN: 30 mg/dL — ABNORMAL HIGH (ref 7–25)
CO2: 32 mmol/L — ABNORMAL HIGH (ref 20–31)
Calcium: 9.6 mg/dL (ref 8.6–10.3)
Chloride: 96 mmol/L — ABNORMAL LOW (ref 98–110)
Creat: 1.6 mg/dL — ABNORMAL HIGH (ref 0.70–1.25)
GFR, EST AFRICAN AMERICAN: 52 mL/min — AB (ref >=60)
GFR, EST NON AFRICAN AMERICAN: 45 mL/min — AB (ref >=60)
GLUCOSE: 176 mg/dL — AB (ref 65–99)
POTASSIUM: 4.3 mmol/L (ref 3.5–5.3)
SODIUM: 137 mmol/L (ref 135–146)
TOTAL PROTEIN: 7 g/dL (ref 6.1–8.1)

## 2015-12-04 ENCOUNTER — Telehealth: Payer: Self-pay | Admitting: *Deleted

## 2015-12-04 NOTE — Telephone Encounter (Signed)
Is he taking he doxycyline? What is his temp? If he has been taking antibiotic and temp is over 100 would recommend going to urgent care or ED

## 2015-12-04 NOTE — Telephone Encounter (Signed)
Patient states yes he is taking antibiotic and he does have temperature. I advised him to go to Urgent Care/ER and he agreed.

## 2015-12-04 NOTE — Telephone Encounter (Signed)
Patient called and stated that he is still waiting on [facility]. I checked and it is a referral in que.  Patient also wanted to let you know that he is running a low grade fever and feels sluggish. Stated that his whole body feels like its getting tighter and tighter. Please Advise.

## 2015-12-05 ENCOUNTER — Other Ambulatory Visit: Payer: Self-pay | Admitting: *Deleted

## 2015-12-05 ENCOUNTER — Telehealth: Payer: Self-pay

## 2015-12-05 MED ORDER — METOLAZONE 2.5 MG PO TABS: 2.5000 mg | ORAL_TABLET | Freq: Every day | ORAL | 0 refills | Status: DC

## 2015-12-05 NOTE — Telephone Encounter (Signed)
Medication list updated and Metolazone called in per lab results. Patient aware.

## 2015-12-05 NOTE — Telephone Encounter (Signed)
Per [name] call patient to get a status update- refer to labs and phone note from yesterday.  Left message on voicemail for patient to return call when available ,

## 2015-12-09 ENCOUNTER — Telehealth: Payer: Self-pay | Admitting: *Deleted

## 2015-12-09 DIAGNOSIS — L03115 Cellulitis of right lower limb: Secondary | ICD-10-CM | POA: Diagnosis not present

## 2015-12-09 DIAGNOSIS — I509 Heart failure, unspecified: Secondary | ICD-10-CM | POA: Diagnosis not present

## 2015-12-09 DIAGNOSIS — M549 Dorsalgia, unspecified: Secondary | ICD-10-CM | POA: Diagnosis not present

## 2015-12-09 DIAGNOSIS — E1165 Type 2 diabetes mellitus with hyperglycemia: Secondary | ICD-10-CM | POA: Diagnosis not present

## 2015-12-09 DIAGNOSIS — E1140 Type 2 diabetes mellitus with diabetic neuropathy, unspecified: Secondary | ICD-10-CM | POA: Diagnosis not present

## 2015-12-09 DIAGNOSIS — I4891 Unspecified atrial fibrillation: Secondary | ICD-10-CM | POA: Diagnosis not present

## 2015-12-09 DIAGNOSIS — D649 Anemia, unspecified: Secondary | ICD-10-CM | POA: Diagnosis not present

## 2015-12-09 DIAGNOSIS — L03116 Cellulitis of left lower limb: Secondary | ICD-10-CM | POA: Diagnosis not present

## 2015-12-09 DIAGNOSIS — I872 Venous insufficiency (chronic) (peripheral): Secondary | ICD-10-CM | POA: Diagnosis not present

## 2015-12-09 DIAGNOSIS — M1991 Primary osteoarthritis, unspecified site: Secondary | ICD-10-CM | POA: Diagnosis not present

## 2015-12-09 NOTE — Telephone Encounter (Signed)
Lori with [facility] called and requested verbal order to wrap patient's legs twice weekly. Verbal order given.

## 2015-12-10 ENCOUNTER — Other Ambulatory Visit: Payer: Medicare Other

## 2015-12-10 DIAGNOSIS — M549 Dorsalgia, unspecified: Secondary | ICD-10-CM | POA: Diagnosis not present

## 2015-12-10 DIAGNOSIS — I872 Venous insufficiency (chronic) (peripheral): Secondary | ICD-10-CM | POA: Diagnosis not present

## 2015-12-10 DIAGNOSIS — E1140 Type 2 diabetes mellitus with diabetic neuropathy, unspecified: Secondary | ICD-10-CM

## 2015-12-10 DIAGNOSIS — D649 Anemia, unspecified: Secondary | ICD-10-CM | POA: Diagnosis not present

## 2015-12-10 DIAGNOSIS — I10 Essential (primary) hypertension: Secondary | ICD-10-CM

## 2015-12-10 DIAGNOSIS — I4891 Unspecified atrial fibrillation: Secondary | ICD-10-CM | POA: Diagnosis not present

## 2015-12-10 DIAGNOSIS — M1991 Primary osteoarthritis, unspecified site: Secondary | ICD-10-CM | POA: Diagnosis not present

## 2015-12-10 DIAGNOSIS — L03116 Cellulitis of left lower limb: Secondary | ICD-10-CM | POA: Diagnosis not present

## 2015-12-10 DIAGNOSIS — I509 Heart failure, unspecified: Secondary | ICD-10-CM | POA: Diagnosis not present

## 2015-12-10 DIAGNOSIS — E1165 Type 2 diabetes mellitus with hyperglycemia: Secondary | ICD-10-CM | POA: Diagnosis not present

## 2015-12-10 DIAGNOSIS — L03115 Cellulitis of right lower limb: Secondary | ICD-10-CM | POA: Diagnosis not present

## 2015-12-10 LAB — COMPREHENSIVE METABOLIC PANEL
ALBUMIN: 3.8 g/dL (ref 3.6–5.1)
ALT: 18 U/L (ref 9–46)
AST: 23 U/L (ref 10–35)
Alkaline Phosphatase: 56 U/L (ref 40–115)
BUN: 23 mg/dL (ref 7–25)
CHLORIDE: 97 mmol/L — AB (ref 98–110)
CO2: 29 mmol/L (ref 20–31)
CREATININE: 1.24 mg/dL (ref 0.70–1.25)
Calcium: 9.4 mg/dL (ref 8.6–10.3)
GLUCOSE: 161 mg/dL — AB (ref 65–99)
Potassium: 4.4 mmol/L (ref 3.5–5.3)
SODIUM: 139 mmol/L (ref 135–146)
Total Bilirubin: 0.5 mg/dL (ref 0.2–1.2)
Total Protein: 7.1 g/dL (ref 6.1–8.1)

## 2015-12-11 ENCOUNTER — Emergency Department (HOSPITAL_COMMUNITY)
Admission: EM | Admit: 2015-12-11 | Discharge: 2015-12-11 | Disposition: A | Discharge status: Home / Self Care | Payer: Medicare Other | Attending: Dermatology | Admitting: Dermatology

## 2015-12-11 ENCOUNTER — Encounter (HOSPITAL_COMMUNITY): Payer: Self-pay | Admitting: Emergency Medicine

## 2015-12-11 DIAGNOSIS — I110 Hypertensive heart disease with heart failure: Secondary | ICD-10-CM | POA: Diagnosis not present

## 2015-12-11 DIAGNOSIS — Z794 Long term (current) use of insulin: Secondary | ICD-10-CM | POA: Diagnosis not present

## 2015-12-11 DIAGNOSIS — R102 Pelvic and perineal pain: Secondary | ICD-10-CM | POA: Diagnosis not present

## 2015-12-11 DIAGNOSIS — E1140 Type 2 diabetes mellitus with diabetic neuropathy, unspecified: Secondary | ICD-10-CM | POA: Diagnosis not present

## 2015-12-11 DIAGNOSIS — Z5321 Procedure and treatment not carried out due to patient leaving prior to being seen by health care provider: Secondary | ICD-10-CM | POA: Insufficient documentation

## 2015-12-11 DIAGNOSIS — K5900 Constipation, unspecified: Secondary | ICD-10-CM | POA: Diagnosis not present

## 2015-12-11 DIAGNOSIS — Z7984 Long term (current) use of oral hypoglycemic drugs: Secondary | ICD-10-CM | POA: Diagnosis not present

## 2015-12-11 DIAGNOSIS — I509 Heart failure, unspecified: Secondary | ICD-10-CM | POA: Diagnosis not present

## 2015-12-11 DIAGNOSIS — E11622 Type 2 diabetes mellitus with other skin ulcer: Secondary | ICD-10-CM | POA: Diagnosis not present

## 2015-12-11 LAB — HEMOGLOBIN A1C
HEMOGLOBIN A1C: 7.7 % — AB (ref <5.7)
MEAN PLASMA GLUCOSE: 174 mg/dL

## 2015-12-11 NOTE — ED Triage Notes (Signed)
Pt from home co  lowervabd pain and constipation x 1 week, also reports brown discharge when urinating that started today. PB 154/74 HR 80 RR 20.

## 2015-12-12 ENCOUNTER — Encounter: Payer: Self-pay | Admitting: Nurse Practitioner

## 2015-12-12 ENCOUNTER — Ambulatory Visit (INDEPENDENT_AMBULATORY_CARE_PROVIDER_SITE_OTHER): Payer: Medicare Other | Admitting: Nurse Practitioner

## 2015-12-12 VITALS — BP 160/90 | HR 77 | Temp 98.0°F | Resp 18 | Ht 69.0 in | Wt >= 6400 oz

## 2015-12-12 DIAGNOSIS — E1165 Type 2 diabetes mellitus with hyperglycemia: Secondary | ICD-10-CM | POA: Diagnosis not present

## 2015-12-12 DIAGNOSIS — M549 Dorsalgia, unspecified: Secondary | ICD-10-CM | POA: Diagnosis not present

## 2015-12-12 DIAGNOSIS — R609 Edema, unspecified: Secondary | ICD-10-CM

## 2015-12-12 DIAGNOSIS — I4891 Unspecified atrial fibrillation: Secondary | ICD-10-CM | POA: Diagnosis not present

## 2015-12-12 DIAGNOSIS — D649 Anemia, unspecified: Secondary | ICD-10-CM | POA: Diagnosis not present

## 2015-12-12 DIAGNOSIS — E1140 Type 2 diabetes mellitus with diabetic neuropathy, unspecified: Secondary | ICD-10-CM

## 2015-12-12 DIAGNOSIS — I10 Essential (primary) hypertension: Secondary | ICD-10-CM | POA: Diagnosis not present

## 2015-12-12 DIAGNOSIS — I509 Heart failure, unspecified: Secondary | ICD-10-CM | POA: Diagnosis not present

## 2015-12-12 DIAGNOSIS — M1991 Primary osteoarthritis, unspecified site: Secondary | ICD-10-CM | POA: Diagnosis not present

## 2015-12-12 DIAGNOSIS — K5903 Drug induced constipation: Secondary | ICD-10-CM

## 2015-12-12 DIAGNOSIS — T402X5A Adverse effect of other opioids, initial encounter: Secondary | ICD-10-CM

## 2015-12-12 DIAGNOSIS — I872 Venous insufficiency (chronic) (peripheral): Secondary | ICD-10-CM | POA: Diagnosis not present

## 2015-12-12 DIAGNOSIS — L03115 Cellulitis of right lower limb: Secondary | ICD-10-CM | POA: Diagnosis not present

## 2015-12-12 DIAGNOSIS — L03116 Cellulitis of left lower limb: Secondary | ICD-10-CM | POA: Diagnosis not present

## 2015-12-12 MED ORDER — INSULIN GLARGINE 100 UNIT/ML SOLOSTAR PEN: 24.0000 [IU] | PEN_INJECTOR | Freq: Every day | SUBCUTANEOUS | 11 refills | Status: DC

## 2015-12-12 MED ORDER — LUBIPROSTONE 24 MCG PO CAPS: 24.0000 ug | ORAL_CAPSULE | Freq: Two times a day (BID) | ORAL | 11 refills | Status: DC

## 2015-12-12 NOTE — Progress Notes (Signed)
Careteam: Patient Care Team: Estill Dooms, MD as PCP - General (Internal Medicine)  Advanced Directive information Does patient have an advance directive?: Yes, Type of Advance Directive: [place];Living will  Allergies  Allergen Reactions  . Adhesive [Tape] Other (See Comments)    Unknown paper tape ok to use   . Feldene [Piroxicam] Other (See Comments)    unknown  . Latex Other (See Comments)    Rash and itching    Chief Complaint  Patient presents with  . Medical Management of Chronic Issues    1 week follow up      HPI: Patient is a 65 y.o. male seen in the office today for follow up.  Pt went to the ED yesterday due to constipation, he was waiting for a while and finally had a BM so he went home Had an appt with cardiology 2 days ago but could never find the place so he missed his appt. He said they are going to call him back for his appt.   At last appt BUN/Cr were elevated and lisinopril was stopped and started on metolazone  He never started metolazone- reports pharmacy called and said it was ready but never picked it up.  conts to gain weight and having increase fluid.  Reports increase shortness of breath- feels like this has to do with constipation.   Repeat BUN/Cr revealed improvement. Estimated Creatinine Clearance: 99.3 mL/min (by C-G formula based on SCr of 1.24 mg/dL).  Redness to right leg has improved, cont to have swelling but it has improved since home health has wrapped legs. Home health was supposed to come by this morning and wrap them again however wrapping had not gotten there this morning. She will be back tomorrow and box came this afternoon.   A1c is not at goal. Taking lantus 22 units. Fasting blood sugars 150-160.  No hypoglycemic episodes.   Review of Systems:  Review of Systems  Constitutional: Negative for activity change, appetite change and fever.  Respiratory: Positive for cough (occasional cough) and shortness  of breath (at times, with increase exertion).   Cardiovascular: Positive for leg swelling. Negative for chest pain and palpitations.       Sleeps in chair due to back pain   Gastrointestinal: Negative for abdominal distention.  Genitourinary: Negative for difficulty urinating and dysuria.  Musculoskeletal: Positive for arthralgias and back pain.       Chronic pain  Skin: Positive for color change.       Chronic venous stasis changes of both lower legs  Neurological: Positive for numbness.    Past Medical History:  Diagnosis Date  . Anemia   . Angina   . Anxiety   . Arthritis   . Atrial fibrillation with RVR ([place]) 01/24/2013  . Blood transfusion    last 10' 14- "GI bleed"  . CHF (congestive heart failure) ([place])   . Depression   . Diabetes mellitus ([place]) 09/18/2011  . Diverticulosis   . DJD (degenerative joint disease)   . DM type 2, uncontrolled, with neuropathy ([place]) 12/26/2013  . Edema extremities    bilateral lower extremities-"weepy areas due to fluid retention"  . Fatty liver   . Fundic gland polyps of stomach, benign   . Headache(784.0)   . Hepatitis B   . History of alcohol abuse    last drink in 1993  . Hypertension   . Neuromuscular disorder ([place])   . 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  Obesity   . Obesity, morbid ([place]) 07/30/2015  . Pneumonia    not at present time  . Renal insufficiency   . Shortness of breath   . Sleep apnea, obstructive    does where bipap. 06-04-14 "doesn't use much now, no mask/tubing now.   Past Surgical History:  Procedure Laterality Date  . ABDOMINAL SURGERY     [phone]  . BACK SURGERY     x 8 -,multiple fusions(cervical to lumbar)  . COLONOSCOPY    . COLONOSCOPY WITH PROPOFOL N/A 06/14/2014   Procedure: COLONOSCOPY WITH PROPOFOL;  Surgeon: Gatha Mayer, MD;  Location: [facility] ENDOSCOPY;  Service: Endoscopy;  Laterality: N/A;  . DIAGNOSTIC LAPAROSCOPY    . ESOPHAGOGASTRODUODENOSCOPY N/A 01/03/2013   Procedure:  ESOPHAGOGASTRODUODENOSCOPY (EGD);  Surgeon: Arta Silence, MD;  Location: [facility] ORS;  Service: Endoscopy;  Laterality: N/A;  . ESOPHAGOGASTRODUODENOSCOPY N/A 01/03/2013   Procedure: ESOPHAGOGASTRODUODENOSCOPY (EGD);  Surgeon: Arta Silence, MD;  Location: [facility];  Service: Endoscopy;  Laterality: N/A;  . ESOPHAGOGASTRODUODENOSCOPY N/A 01/23/2013   Procedure: ESOPHAGOGASTRODUODENOSCOPY (EGD);  Surgeon: Lear Ng, MD;  Location: [facility] ENDOSCOPY;  Service: Endoscopy;  Laterality: N/A;  . ESOPHAGOGASTRODUODENOSCOPY Left 01/27/2013   Procedure: ESOPHAGOGASTRODUODENOSCOPY (EGD);  Surgeon: Lear Ng, MD;  Location: [facility];  Service: Endoscopy;  Laterality: Left;  . ESOPHAGOGASTRODUODENOSCOPY N/A 06/14/2014   Procedure: ESOPHAGOGASTRODUODENOSCOPY (EGD);  Surgeon: Gatha Mayer, MD;  Location: [facility];  Service: Endoscopy;  Laterality: N/A;  . ESOPHAGOSCOPY N/A 01/01/2013   Procedure: ESOPHAGOSCOPY;  Surgeon: Jeryl Columbia, MD;  Location: [facility];  Service: Endoscopy;  Laterality: N/A;  . GASTRIC BYPASS  1977   Reversed in 1992 and revision 1994  . HYDROCELE EXCISION  1996   x 2   . KNEE SURGERY Left 2004   Social History:   reports that he has never smoked. He has never used smokeless tobacco. He reports that he does not drink alcohol or use drugs.  Family History  Problem Relation Age of Onset  . Heart disease Father   . Skin cancer Father   . COPD Father   . Hypertension Father   . Stroke Father   . Arthritis Mother   . Osteoporosis Mother   . Diabetes Daughter   . Depression Son   . Heart disease Maternal Uncle   . High blood pressure Sister   . High blood pressure Sister   . High blood pressure Son   . Depression Son   . Arthritis Sister   . Arthritis Sister   . Cancer Maternal Grandmother   . Heart disease Maternal Uncle   . Heart disease Paternal Uncle     Medications: Patient's Medications  New Prescriptions   No medications on file  Previous  Medications   ACETAMINOPHEN (TYLENOL) 650 MG CR TABLET    Take 1,950 mg by mouth 2 (two) times daily.   AMLODIPINE (NORVASC) 10 MG TABLET    Take 10 mg by mouth daily.   ATORVASTATIN (LIPITOR) 40 MG TABLET    take 1 tablet once daily   DULOXETINE (CYMBALTA) 60 MG CAPSULE    One twice daily to help neuropathy   GLUCOSE BLOOD (ACCU-CHEK AVIVA PLUS) TEST STRIP    1 each by Other route See admin instructions. Check blood sugar twice daily   INSULIN PEN NEEDLE (NOVOFINE) 30G X 8 MM MISC    Inject 1 packet into the skin See admin instructions. Use with insulin once daily.   LANTUS SOLOSTAR 100 UNIT/ML SOLOSTAR PEN  inject 22-24 UNITS EVERY MORNING   LUBIPROSTONE (AMITIZA) 24 MCG CAPSULE    Take 1 capsule (24 mcg total) by mouth 2 (two) times daily with a meal.   LYRICA 150 MG CAPSULE    Take 1 capsule by mouth 3 (three) times daily.   METFORMIN (GLUCOPHAGE) 1000 MG TABLET    Take 1 tablet (1,000 mg total) by mouth 2 (two) times daily with a meal.   METHADONE (DOLOPHINE) 10 MG TABLET    Take One tablet by mouth 4 times daily  to control pain   METOLAZONE (ZAROXOLYN) 2.5 MG TABLET    Take 1 tablet (2.5 mg total) by mouth daily.   METOPROLOL (LOPRESSOR) 50 MG TABLET    TAKE ONE TABLET TWICE DAILY   MULTIPLE VITAMIN (MULITIVITAMIN WITH MINERALS) TABS    Take 1 tablet by mouth daily.   NYSTATIN CREAM (MYCOSTATIN)    Apply twice daily to rash   PANTOPRAZOLE (PROTONIX) 40 MG TABLET    TAKE 1 TABLET TWICE DAILY   POLYETHYLENE GLYCOL (MIRALAX / GLYCOLAX) PACKET    Take 17 g by mouth daily as needed for mild constipation or moderate constipation.   TORSEMIDE (DEMADEX) 100 MG TABLET    Take 1 tablet (100 mg total) by mouth 2 (two) times daily.   TRAZODONE (DESYREL) 100 MG TABLET    Take 3 tablets by mouth at bedtime.   TRIAMCINOLONE CREAM (KENALOG) 0.1 %    Apply sparingly up to twice daily to rash until it is resolved  Modified Medications   No medications on file  Discontinued Medications   DOXYCYCLINE  (VIBRA-TABS) 100 MG TABLET    Take 1 tablet (100 mg total) by mouth 2 (two) times daily.     Physical Exam:  Vitals:   12/12/15 1425  BP: (!) 160/90  Pulse: 77  Resp: 18  Temp: 98 F (36.7 C)  TempSrc: Oral  SpO2: 95%  Weight: (!) 409 lb 6.4 oz (185.7 kg)  Height: 5\' 9"  (1.753 m)   Body mass index is 60.46 kg/m.  Physical Exam  Constitutional: He is oriented to person, place, and time. He appears well-developed and well-nourished. No distress.  Morbid obesity  Cardiovascular: Normal rate and regular rhythm.  Exam reveals no gallop.   Pulmonary/Chest: Effort normal and breath sounds normal.  Musculoskeletal: He exhibits edema (1+ edema bilaterally). He exhibits no tenderness.  Neurological: He is alert and oriented to person, place, and time.  08/15/14 MMSE 27/30 09/06/2014  PNCV showed severe bilateral median nerve neuropathy consistent with severe carpal tunnel syndrome.  Skin: No pallor.  Chronic venous stasis changes of both anterior legs.   Psychiatric: He has a normal mood and affect.    Labs reviewed: Basic Metabolic Panel:  Recent Labs  07/30/15 0920 12/02/15 1603 12/10/15 1049  NA 140 137 139  K 4.4 4.3 4.4  CL 97* 96* 97*  CO2 31 32* 29  GLUCOSE 156* 176* 161*  BUN 26* 30* 23  CREATININE 1.26* 1.60* 1.24  CALCIUM 9.6 9.6 9.4   Liver Function Tests:  Recent Labs  07/30/15 0920 12/02/15 1603 12/10/15 1049  AST 45* 25 23  ALT 28 16 18   ALKPHOS 57 53 56  BILITOT 0.4 0.3 0.5  PROT 7.2 7.0 7.1  ALBUMIN 3.4* 3.7 3.8   No results for input(s): LIPASE, AMYLASE in the last 8760 hours. No results for input(s): AMMONIA in the last 8760 hours. CBC:  Recent Labs  02/12/15 1424 07/30/15 0920 12/02/15 1603  WBC 6.4 8.1 7.5  NEUTROABS 3.8 5.0 3,750  HGB  --  12.5* 12.4*  HCT 37.9 40.4 37.8*  MCV 84 89.8 88.1  PLT  --  193 229   Lipid Panel:  Recent Labs  02/12/15 1424  CHOL 223*  HDL 38*  LDLCALC 116*  TRIG 347*  CHOLHDL 5.9*    TSH: No results for input(s): TSH in the last 8760 hours. A1C: Lab Results  Component Value Date   HGBA1C 7.7 (H) 12/10/2015     Assessment/Plan 1. Edema, unspecified type Improved with wraps, to cont at this time.   2. Chronic congestive heart failure, unspecified congestive heart failure type ([place]) Increase weight gain, added metolazone 2.5 mg daily to help with diuresis, needs to pick up from pharmacy. conts on demadex 100 mg BID To cont to weigh daily, to notify if weight conts to trend up once he has started metolazone  Low sodium diet Encouraged to call to reschedule cardiology appt   3. Cellulitis of leg, right improved  4. Type 2 diabetes mellitus with diabetic neuropathy, unspecified long term insulin use status (H[facility]) - Insulin Glargine (LANTUS SOLOSTAR) 100 UNIT/ML Solostar Pen; Inject 24 Units into the skin daily.  Dispense: 15 mL; Refill: 11  5. Essential hypertension Elevated today, BUN/CR improved, to restart lisinopril   6. Constipation due to opioid therapy -not taking amitiza which is on medication list, will refill, samples also given - lubiprostone (AMITIZA) 24 MCG capsule; Take 1 capsule (24 mcg total) by mouth 2 (two) times daily with a meal.  Dispense: 60 capsule; Refill: 45  HH nursing to cont to follow, to follow up in office in 2 weeks on weight, CHF, constipation and BP, sooner if needed.   Carlos American. Harle Battiest  [facility] [phone] 8 am - 5 pm) [phone] (after hours)

## 2015-12-12 NOTE — Patient Instructions (Addendum)
Start taking Metolazone (zaroxolyn) 2.5 mg daily to help with fluid  Okay to restart lisinopril 2.5 mg daily  To start Amitiza 24 mcg twice daily for constipation  To increase lantus to 24 units for diabetes

## 2015-12-13 ENCOUNTER — Other Ambulatory Visit: Payer: Self-pay | Admitting: *Deleted

## 2015-12-13 DIAGNOSIS — G5610 Other lesions of median nerve, unspecified upper limb: Secondary | ICD-10-CM

## 2015-12-13 MED ORDER — METHADONE HCL 10 MG PO TABS: ORAL_TABLET | ORAL | 0 refills | Status: DC

## 2015-12-13 NOTE — Telephone Encounter (Signed)
Patient wanted Rx yesterday at appointment but was not due and [name] stated to print today and have signed for patient to pick up

## 2015-12-17 DIAGNOSIS — L03116 Cellulitis of left lower limb: Secondary | ICD-10-CM | POA: Diagnosis not present

## 2015-12-17 DIAGNOSIS — E1140 Type 2 diabetes mellitus with diabetic neuropathy, unspecified: Secondary | ICD-10-CM | POA: Diagnosis not present

## 2015-12-17 DIAGNOSIS — E1165 Type 2 diabetes mellitus with hyperglycemia: Secondary | ICD-10-CM | POA: Diagnosis not present

## 2015-12-17 DIAGNOSIS — D649 Anemia, unspecified: Secondary | ICD-10-CM | POA: Diagnosis not present

## 2015-12-17 DIAGNOSIS — I872 Venous insufficiency (chronic) (peripheral): Secondary | ICD-10-CM | POA: Diagnosis not present

## 2015-12-17 DIAGNOSIS — M1991 Primary osteoarthritis, unspecified site: Secondary | ICD-10-CM | POA: Diagnosis not present

## 2015-12-17 DIAGNOSIS — L03115 Cellulitis of right lower limb: Secondary | ICD-10-CM | POA: Diagnosis not present

## 2015-12-17 DIAGNOSIS — I509 Heart failure, unspecified: Secondary | ICD-10-CM | POA: Diagnosis not present

## 2015-12-17 DIAGNOSIS — M549 Dorsalgia, unspecified: Secondary | ICD-10-CM | POA: Diagnosis not present

## 2015-12-17 DIAGNOSIS — I4891 Unspecified atrial fibrillation: Secondary | ICD-10-CM | POA: Diagnosis not present

## 2015-12-19 DIAGNOSIS — I509 Heart failure, unspecified: Secondary | ICD-10-CM | POA: Diagnosis not present

## 2015-12-19 DIAGNOSIS — E1140 Type 2 diabetes mellitus with diabetic neuropathy, unspecified: Secondary | ICD-10-CM | POA: Diagnosis not present

## 2015-12-19 DIAGNOSIS — I4891 Unspecified atrial fibrillation: Secondary | ICD-10-CM | POA: Diagnosis not present

## 2015-12-19 DIAGNOSIS — M1991 Primary osteoarthritis, unspecified site: Secondary | ICD-10-CM | POA: Diagnosis not present

## 2015-12-19 DIAGNOSIS — E1165 Type 2 diabetes mellitus with hyperglycemia: Secondary | ICD-10-CM | POA: Diagnosis not present

## 2015-12-19 DIAGNOSIS — L03115 Cellulitis of right lower limb: Secondary | ICD-10-CM | POA: Diagnosis not present

## 2015-12-19 DIAGNOSIS — D649 Anemia, unspecified: Secondary | ICD-10-CM | POA: Diagnosis not present

## 2015-12-19 DIAGNOSIS — I872 Venous insufficiency (chronic) (peripheral): Secondary | ICD-10-CM | POA: Diagnosis not present

## 2015-12-19 DIAGNOSIS — M549 Dorsalgia, unspecified: Secondary | ICD-10-CM | POA: Diagnosis not present

## 2015-12-19 DIAGNOSIS — L03116 Cellulitis of left lower limb: Secondary | ICD-10-CM | POA: Diagnosis not present

## 2015-12-24 DIAGNOSIS — L03116 Cellulitis of left lower limb: Secondary | ICD-10-CM | POA: Diagnosis not present

## 2015-12-24 DIAGNOSIS — M1991 Primary osteoarthritis, unspecified site: Secondary | ICD-10-CM | POA: Diagnosis not present

## 2015-12-24 DIAGNOSIS — I872 Venous insufficiency (chronic) (peripheral): Secondary | ICD-10-CM | POA: Diagnosis not present

## 2015-12-24 DIAGNOSIS — D649 Anemia, unspecified: Secondary | ICD-10-CM | POA: Diagnosis not present

## 2015-12-24 DIAGNOSIS — E1140 Type 2 diabetes mellitus with diabetic neuropathy, unspecified: Secondary | ICD-10-CM | POA: Diagnosis not present

## 2015-12-24 DIAGNOSIS — M549 Dorsalgia, unspecified: Secondary | ICD-10-CM | POA: Diagnosis not present

## 2015-12-24 DIAGNOSIS — E1165 Type 2 diabetes mellitus with hyperglycemia: Secondary | ICD-10-CM | POA: Diagnosis not present

## 2015-12-24 DIAGNOSIS — L03115 Cellulitis of right lower limb: Secondary | ICD-10-CM | POA: Diagnosis not present

## 2015-12-24 DIAGNOSIS — I4891 Unspecified atrial fibrillation: Secondary | ICD-10-CM | POA: Diagnosis not present

## 2015-12-24 DIAGNOSIS — I509 Heart failure, unspecified: Secondary | ICD-10-CM | POA: Diagnosis not present

## 2015-12-26 ENCOUNTER — Ambulatory Visit: Payer: Medicare Other | Admitting: Nurse Practitioner

## 2015-12-26 ENCOUNTER — Other Ambulatory Visit: Payer: Self-pay

## 2015-12-26 ENCOUNTER — Telehealth: Payer: Self-pay

## 2015-12-26 ENCOUNTER — Encounter: Payer: Self-pay | Admitting: Internal Medicine

## 2015-12-26 DIAGNOSIS — D649 Anemia, unspecified: Secondary | ICD-10-CM | POA: Diagnosis not present

## 2015-12-26 DIAGNOSIS — I4891 Unspecified atrial fibrillation: Secondary | ICD-10-CM | POA: Diagnosis not present

## 2015-12-26 DIAGNOSIS — I10 Essential (primary) hypertension: Secondary | ICD-10-CM

## 2015-12-26 DIAGNOSIS — M1991 Primary osteoarthritis, unspecified site: Secondary | ICD-10-CM | POA: Diagnosis not present

## 2015-12-26 DIAGNOSIS — E1140 Type 2 diabetes mellitus with diabetic neuropathy, unspecified: Secondary | ICD-10-CM

## 2015-12-26 DIAGNOSIS — M549 Dorsalgia, unspecified: Secondary | ICD-10-CM | POA: Diagnosis not present

## 2015-12-26 DIAGNOSIS — E1165 Type 2 diabetes mellitus with hyperglycemia: Secondary | ICD-10-CM

## 2015-12-26 DIAGNOSIS — L03115 Cellulitis of right lower limb: Secondary | ICD-10-CM | POA: Diagnosis not present

## 2015-12-26 DIAGNOSIS — I509 Heart failure, unspecified: Secondary | ICD-10-CM | POA: Diagnosis not present

## 2015-12-26 DIAGNOSIS — I872 Venous insufficiency (chronic) (peripheral): Secondary | ICD-10-CM | POA: Diagnosis not present

## 2015-12-26 DIAGNOSIS — R609 Edema, unspecified: Secondary | ICD-10-CM

## 2015-12-26 DIAGNOSIS — L03116 Cellulitis of left lower limb: Secondary | ICD-10-CM | POA: Diagnosis not present

## 2015-12-26 DIAGNOSIS — IMO0002 Reserved for concepts with insufficient information to code with codable children: Secondary | ICD-10-CM

## 2015-12-26 NOTE — Telephone Encounter (Signed)
agree

## 2015-12-26 NOTE — Telephone Encounter (Signed)
Gina with [facility] called to request verbal order to continue leg wraps 2 x weekly, patient still with blistering. Swelling has decreased.   Per [employer] standing order, verbal order given. Message will be sent to patient's provider as a FYI.

## 2015-12-27 ENCOUNTER — Other Ambulatory Visit: Payer: Self-pay

## 2015-12-27 NOTE — Telephone Encounter (Signed)
Patient was seen on 12/12/15

## 2015-12-30 ENCOUNTER — Ambulatory Visit (INDEPENDENT_AMBULATORY_CARE_PROVIDER_SITE_OTHER): Payer: Medicare Other | Admitting: Nurse Practitioner

## 2015-12-30 ENCOUNTER — Encounter: Payer: Self-pay | Admitting: Nurse Practitioner

## 2015-12-30 ENCOUNTER — Other Ambulatory Visit: Payer: Self-pay | Admitting: Nurse Practitioner

## 2015-12-30 VITALS — BP 138/86 | HR 89 | Temp 97.9°F | Resp 18 | Ht 69.0 in | Wt 391.6 lb

## 2015-12-30 DIAGNOSIS — T402X5A Adverse effect of other opioids, initial encounter: Secondary | ICD-10-CM

## 2015-12-30 DIAGNOSIS — E1140 Type 2 diabetes mellitus with diabetic neuropathy, unspecified: Secondary | ICD-10-CM

## 2015-12-30 DIAGNOSIS — I509 Heart failure, unspecified: Secondary | ICD-10-CM | POA: Diagnosis not present

## 2015-12-30 DIAGNOSIS — E1165 Type 2 diabetes mellitus with hyperglycemia: Secondary | ICD-10-CM | POA: Diagnosis not present

## 2015-12-30 DIAGNOSIS — R609 Edema, unspecified: Secondary | ICD-10-CM

## 2015-12-30 DIAGNOSIS — IMO0002 Reserved for concepts with insufficient information to code with codable children: Secondary | ICD-10-CM

## 2015-12-30 DIAGNOSIS — R300 Dysuria: Secondary | ICD-10-CM | POA: Diagnosis not present

## 2015-12-30 DIAGNOSIS — K5903 Drug induced constipation: Secondary | ICD-10-CM | POA: Diagnosis not present

## 2015-12-30 DIAGNOSIS — I10 Essential (primary) hypertension: Secondary | ICD-10-CM

## 2015-12-30 DIAGNOSIS — R3915 Urgency of urination: Secondary | ICD-10-CM | POA: Diagnosis not present

## 2015-12-30 LAB — POCT URINALYSIS DIPSTICK
Ketones, UA: NEGATIVE
LEUKOCYTES UA: NEGATIVE
NITRITE UA: NEGATIVE
PH UA: 7
Protein, UA: NEGATIVE
Spec Grav, UA: <=1.005
UROBILINOGEN UA: 0.2

## 2015-12-30 MED ORDER — METHYLNALTREXONE BROMIDE 150 MG PO TABS: 450.0000 mg | ORAL_TABLET | Freq: Every day | ORAL | 1 refills | Status: DC

## 2015-12-30 MED ORDER — LISINOPRIL 20 MG PO TABS: 20.0000 mg | ORAL_TABLET | Freq: Every day | ORAL | 3 refills | Status: DC

## 2015-12-30 NOTE — Patient Instructions (Signed)
Please schedule follow up cardiology appt  To bring blood sugar log with you to follow up   To stop amitiza-- start relistor for constipation. Take 3 capsules with water 30 mins before first meal.

## 2015-12-30 NOTE — Progress Notes (Signed)
Careteam: Patient Care Team: Estill Dooms, MD as PCP - General (Internal Medicine)  Advanced Directive information Does patient have an advance directive?: No  Allergies  Allergen Reactions  . Adhesive [Tape] Other (See Comments)    Unknown paper tape ok to use   . Feldene [Piroxicam] Other (See Comments)    unknown  . Latex Other (See Comments)    Rash and itching    Chief Complaint  Patient presents with  . Medical Management of Chronic Issues    2 week follow up for weight gain and BP.      HPI: Patient is a 65 y.o. male seen in the office today for follow up on weight gain and blood pressure.  Pt with hx of edema, diabetes, constipation, CHF, HTN.  Pt reports major complaint today is no bowel movement. Has been taking amitiza 24 mcg by mouth twice daily without results. Bought mag citrate and was still not able to have BM.   lantus increased to 24 units daily- blood sugars fasting 212, 282,151,155  but in the evening was 130, no hypoglycemic epidsode  CHF-  Has yet to reschedule cardiologist appt.  Breathing is good, no shortness of breath, swelling has improved. Weight down to 391 from 409 lb. Has been taking metolazone 2.5 mg daily with demadex 100 mg BID   Increase incontinence and burning with urination. Burning 60% of the time.  Feeling bloated due to not having BM No fevers or chills No abdominal pain.   Hypertension- good at home, 130/70-80, taking lisinopril, norvsac, demadex and metolazone. Needs refill on lisinopril.   Review of Systems:  Review of Systems  Constitutional: Negative for activity change, appetite change and fever.  Respiratory: Negative for cough (occasional cough) and shortness of breath.   Cardiovascular: Negative for chest pain, palpitations and leg swelling.       Sleeps in chair due to back pain   Gastrointestinal: Negative for abdominal distention.  Genitourinary: Negative for difficulty urinating and dysuria.    Musculoskeletal: Positive for arthralgias and back pain.       Chronic pain  Skin: Positive for color change.       Chronic venous stasis changes of both lower legs  Neurological: Positive for numbness.    Past Medical History:  Diagnosis Date  . Anemia   . Angina   . Anxiety   . Arthritis   . Atrial fibrillation with RVR ([place]) 01/24/2013  . Blood transfusion    last 10' 14- "GI bleed"  . CHF (congestive heart failure) ([place])   . Depression   . Diabetes mellitus ([place]) 09/18/2011  . Diverticulosis   . DJD (degenerative joint disease)   . DM type 2, uncontrolled, with neuropathy ([place]) 12/26/2013  . Edema extremities    bilateral lower extremities-"weepy areas due to fluid retention"  . Fatty liver   . Fundic gland polyps of stomach, benign   . Headache(784.0)   . Hepatitis B   . History of alcohol abuse    last drink in 1993  . Hypertension   . Neuromuscular disorder ([place])   . 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 Obesity   . Obesity, morbid ([place]) 07/30/2015  . Pneumonia    not at present time  . Renal insufficiency   . Shortness of breath   . Sleep apnea, obstructive    does where bipap. 06-04-14 "doesn't use much now, no mask/tubing now.   Past Surgical History:  Procedure Laterality  Date  . ABDOMINAL SURGERY     [phone]  . BACK SURGERY     x 8 -,multiple fusions(cervical to lumbar)  . COLONOSCOPY    . COLONOSCOPY WITH PROPOFOL N/A 06/14/2014   Procedure: COLONOSCOPY WITH PROPOFOL;  Surgeon: Gatha Mayer, MD;  Location: [facility];  Service: Endoscopy;  Laterality: N/A;  . DIAGNOSTIC LAPAROSCOPY    . ESOPHAGOGASTRODUODENOSCOPY N/A 01/03/2013   Procedure: ESOPHAGOGASTRODUODENOSCOPY (EGD);  Surgeon: Arta Silence, MD;  Location: [facility];  Service: Endoscopy;  Laterality: N/A;  . ESOPHAGOGASTRODUODENOSCOPY N/A 01/03/2013   Procedure: ESOPHAGOGASTRODUODENOSCOPY (EGD);  Surgeon: Arta Silence, MD;  Location: [facility];  Service: Endoscopy;   Laterality: N/A;  . ESOPHAGOGASTRODUODENOSCOPY N/A 01/23/2013   Procedure: ESOPHAGOGASTRODUODENOSCOPY (EGD);  Surgeon: Lear Ng, MD;  Location: [facility] ENDOSCOPY;  Service: Endoscopy;  Laterality: N/A;  . ESOPHAGOGASTRODUODENOSCOPY Left 01/27/2013   Procedure: ESOPHAGOGASTRODUODENOSCOPY (EGD);  Surgeon: Lear Ng, MD;  Location: [facility];  Service: Endoscopy;  Laterality: Left;  . ESOPHAGOGASTRODUODENOSCOPY N/A 06/14/2014   Procedure: ESOPHAGOGASTRODUODENOSCOPY (EGD);  Surgeon: Gatha Mayer, MD;  Location: [facility];  Service: Endoscopy;  Laterality: N/A;  . ESOPHAGOSCOPY N/A 01/01/2013   Procedure: ESOPHAGOSCOPY;  Surgeon: Jeryl Columbia, MD;  Location: [facility];  Service: Endoscopy;  Laterality: N/A;  . GASTRIC BYPASS  1977   Reversed in 1992 and revision 1994  . HYDROCELE EXCISION  1996   x 2   . KNEE SURGERY Left 2004   Social History:   reports that he has never smoked. He has never used smokeless tobacco. He reports that he does not drink alcohol or use drugs.  Family History  Problem Relation Age of Onset  . Heart disease Father   . Skin cancer Father   . COPD Father   . Hypertension Father   . Stroke Father   . Arthritis Mother   . Osteoporosis Mother   . Diabetes Daughter   . Depression Son   . Heart disease Maternal Uncle   . High blood pressure Sister   . High blood pressure Sister   . High blood pressure Son   . Depression Son   . Arthritis Sister   . Arthritis Sister   . Cancer Maternal Grandmother   . Heart disease Maternal Uncle   . Heart disease Paternal Uncle     Medications: Patient's Medications  New Prescriptions   No medications on file  Previous Medications   ACETAMINOPHEN (TYLENOL) 650 MG CR TABLET    Take 1,950 mg by mouth 2 (two) times daily.   AMLODIPINE (NORVASC) 10 MG TABLET    Take 10 mg by mouth daily.   ATORVASTATIN (LIPITOR) 40 MG TABLET    take 1 tablet once daily   DULOXETINE (CYMBALTA) 60 MG CAPSULE    One twice daily to  help neuropathy   GLUCOSE BLOOD (ACCU-CHEK AVIVA PLUS) TEST STRIP    1 each by Other route See admin instructions. Check blood sugar twice daily   INSULIN GLARGINE (LANTUS SOLOSTAR) 100 UNIT/ML SOLOSTAR PEN    Inject 24 Units into the skin daily.   INSULIN PEN NEEDLE (NOVOFINE) 30G X 8 MM MISC    Inject 1 packet into the skin See admin instructions. Use with insulin once daily.   LUBIPROSTONE (AMITIZA) 24 MCG CAPSULE    Take 1 capsule (24 mcg total) by mouth 2 (two) times daily with a meal.   LYRICA 150 MG CAPSULE    Take 1 capsule by mouth 3 (three) times daily.  METFORMIN (GLUCOPHAGE) 1000 MG TABLET    Take 1 tablet (1,000 mg total) by mouth 2 (two) times daily with a meal.   METHADONE (DOLOPHINE) 10 MG TABLET    Take One tablet by mouth 4 times daily  to control pain   METOLAZONE (ZAROXOLYN) 2.5 MG TABLET    Take 1 tablet (2.5 mg total) by mouth daily.   METOPROLOL (LOPRESSOR) 50 MG TABLET    TAKE ONE TABLET TWICE DAILY   MULTIPLE VITAMIN (MULITIVITAMIN WITH MINERALS) TABS    Take 1 tablet by mouth daily.   NYSTATIN CREAM (MYCOSTATIN)    Apply twice daily to rash   PANTOPRAZOLE (PROTONIX) 40 MG TABLET    TAKE 1 TABLET TWICE DAILY   POLYETHYLENE GLYCOL (MIRALAX / GLYCOLAX) PACKET    Take 17 g by mouth daily as needed for mild constipation or moderate constipation.   TORSEMIDE (DEMADEX) 100 MG TABLET    Take 1 tablet (100 mg total) by mouth 2 (two) times daily.   TRAZODONE (DESYREL) 100 MG TABLET    Take 3 tablets by mouth at bedtime.   TRIAMCINOLONE CREAM (KENALOG) 0.1 %    Apply sparingly up to twice daily to rash until it is resolved  Modified Medications   No medications on file  Discontinued Medications   No medications on file     Physical Exam:  Vitals:   12/30/15 1510  BP: 138/86  Pulse: 89  Resp: 18  Temp: 97.9 F (36.6 C)  TempSrc: Axillary  SpO2: 95%  Weight: (!) 391 lb 9.6 oz (177.6 kg)  Height: '5\' 9"'  (1.753 m)   Body mass index is 57.83 kg/m.  Physical Exam    Constitutional: He is oriented to person, place, and time. He appears well-developed and well-nourished. No distress.  Morbid obesity  Cardiovascular: Normal rate and regular rhythm.  Exam reveals no gallop.   Pulmonary/Chest: Effort normal and breath sounds normal.  Musculoskeletal: He exhibits no edema or tenderness.  Neurological: He is alert and oriented to person, place, and time.  08/15/14 MMSE 27/30 09/06/2014  PNCV showed severe bilateral median nerve neuropathy consistent with severe carpal tunnel syndrome.  Skin: No pallor.  Chronic venous stasis changes of both anterior legs.   Psychiatric: He has a normal mood and affect.    Labs reviewed: Basic Metabolic Panel:  Recent Labs  07/30/15 0920 12/02/15 1603 12/10/15 1049  NA 140 137 139  K 4.4 4.3 4.4  CL 97* 96* 97*  CO2 31 32* 29  GLUCOSE 156* 176* 161*  BUN 26* 30* 23  CREATININE 1.26* 1.60* 1.24  CALCIUM 9.6 9.6 9.4   Liver Function Tests:  Recent Labs  07/30/15 0920 12/02/15 1603 12/10/15 1049  AST 45* 25 23  ALT '28 16 18  ' ALKPHOS 57 53 56  BILITOT 0.4 0.3 0.5  PROT 7.2 7.0 7.1  ALBUMIN 3.4* 3.7 3.8   No results for input(s): LIPASE, AMYLASE in the last 8760 hours. No results for input(s): AMMONIA in the last 8760 hours. CBC:  Recent Labs  02/12/15 1424 07/30/15 0920 12/02/15 1603  WBC 6.4 8.1 7.5  NEUTROABS 3.8 5.0 3,750  HGB  --  12.5* 12.4*  HCT 37.9 40.4 37.8*  MCV 84 89.8 88.1  PLT  --  193 229   Lipid Panel:  Recent Labs  02/12/15 1424  CHOL 223*  HDL 38*  LDLCALC 116*  TRIG 347*  CHOLHDL 5.9*   TSH: No results for input(s): TSH in the last 8760  hours. A1C: Lab Results  Component Value Date   HGBA1C 7.7 (H) 12/10/2015     Assessment/Plan 1. Essential hypertension Blood pressure improved, cont current regimen  - lisinopril (PRINIVIL,ZESTRIL) 20 MG tablet; Take 1 tablet (20 mg total) by mouth daily.  Dispense: 90 tablet; Refill: 3 - BMP with eGFR  2. DM type 2,  uncontrolled, with neuropathy ([facility]) -to cont lantus 24 units, to bring blood sugar log to next visit - BMP with eGFR  3. Dysuria with Frequency  - Culture, Urine - POCT urinalysis dipstick - PSA  4. Chronic congestive heart failure, unspecified congestive heart failure type ([place]) -encouraged to reschedule cardiology appt. Weight is currently down. Will cont diuretics and follow up BMP today   5. Edema, unspecified type Improved  - BMP with eGFR  6. Constipation due to opioid therapy -good bowel sounds today, without abdominal pain, will stop amitiza and start relistor, to call if not effective.  - Methylnaltrexone Bromide (RELISTOR) 150 MG TABS; Take 450 mg by mouth daily.  Dispense: 90 tablet; Refill: 1  To keep follow up with Dr Assunta Found. Harle Battiest  [facility] [phone] 8 am - 5 pm) [phone] (after hours)

## 2015-12-31 DIAGNOSIS — D649 Anemia, unspecified: Secondary | ICD-10-CM | POA: Diagnosis not present

## 2015-12-31 DIAGNOSIS — L03115 Cellulitis of right lower limb: Secondary | ICD-10-CM | POA: Diagnosis not present

## 2015-12-31 DIAGNOSIS — M1991 Primary osteoarthritis, unspecified site: Secondary | ICD-10-CM | POA: Diagnosis not present

## 2015-12-31 DIAGNOSIS — I509 Heart failure, unspecified: Secondary | ICD-10-CM | POA: Diagnosis not present

## 2015-12-31 DIAGNOSIS — I4891 Unspecified atrial fibrillation: Secondary | ICD-10-CM | POA: Diagnosis not present

## 2015-12-31 DIAGNOSIS — E1140 Type 2 diabetes mellitus with diabetic neuropathy, unspecified: Secondary | ICD-10-CM | POA: Diagnosis not present

## 2015-12-31 DIAGNOSIS — M549 Dorsalgia, unspecified: Secondary | ICD-10-CM | POA: Diagnosis not present

## 2015-12-31 DIAGNOSIS — E1165 Type 2 diabetes mellitus with hyperglycemia: Secondary | ICD-10-CM | POA: Diagnosis not present

## 2015-12-31 DIAGNOSIS — I872 Venous insufficiency (chronic) (peripheral): Secondary | ICD-10-CM | POA: Diagnosis not present

## 2015-12-31 DIAGNOSIS — L03116 Cellulitis of left lower limb: Secondary | ICD-10-CM | POA: Diagnosis not present

## 2015-12-31 LAB — BASIC METABOLIC PANEL WITH GFR
BUN: 36 mg/dL — AB (ref 7–25)
CALCIUM: 10 mg/dL (ref 8.6–10.3)
CO2: 28 mmol/L (ref 20–31)
CREATININE: 1.59 mg/dL — AB (ref 0.70–1.25)
Chloride: 90 mmol/L — ABNORMAL LOW (ref 98–110)
GFR, Est African American: 52 mL/min — ABNORMAL LOW (ref >=60)
GFR, Est Non African American: 45 mL/min — ABNORMAL LOW (ref >=60)
GLUCOSE: 219 mg/dL — AB (ref 65–99)
Potassium: 4.5 mmol/L (ref 3.5–5.3)
Sodium: 136 mmol/L (ref 135–146)

## 2015-12-31 LAB — PSA: PSA: 0.4 ng/mL (ref <=4.0)

## 2016-01-01 ENCOUNTER — Other Ambulatory Visit: Payer: Self-pay | Admitting: *Deleted

## 2016-01-01 ENCOUNTER — Other Ambulatory Visit: Payer: Self-pay | Admitting: Internal Medicine

## 2016-01-01 DIAGNOSIS — F3289 Other specified depressive episodes: Secondary | ICD-10-CM

## 2016-01-01 LAB — URINE CULTURE: ORGANISM ID, BACTERIA: NO GROWTH

## 2016-01-01 MED ORDER — DULOXETINE HCL 60 MG PO CPEP: ORAL_CAPSULE | ORAL | 3 refills | Status: DC

## 2016-01-01 MED ORDER — TORSEMIDE 100 MG PO TABS: ORAL_TABLET | ORAL | 3 refills | Status: DC

## 2016-01-01 MED ORDER — TRAZODONE HCL 100 MG PO TABS
300.0000 mg | ORAL_TABLET | Freq: Every day | ORAL | 1 refills | Status: DC
Start: 2016-01-01 — End: 2016-01-29

## 2016-01-01 MED ORDER — PANTOPRAZOLE SODIUM 40 MG PO TBEC: 40.0000 mg | DELAYED_RELEASE_TABLET | Freq: Two times a day (BID) | ORAL | 3 refills | Status: DC

## 2016-01-01 MED ORDER — METOPROLOL TARTRATE 50 MG PO TABS: 50.0000 mg | ORAL_TABLET | Freq: Two times a day (BID) | ORAL | 3 refills | Status: DC

## 2016-01-01 NOTE — Telephone Encounter (Signed)
[facility] 

## 2016-01-02 ENCOUNTER — Other Ambulatory Visit: Payer: Self-pay | Admitting: Internal Medicine

## 2016-01-02 DIAGNOSIS — E1140 Type 2 diabetes mellitus with diabetic neuropathy, unspecified: Secondary | ICD-10-CM | POA: Diagnosis not present

## 2016-01-02 DIAGNOSIS — E1165 Type 2 diabetes mellitus with hyperglycemia: Secondary | ICD-10-CM | POA: Diagnosis not present

## 2016-01-02 DIAGNOSIS — M1991 Primary osteoarthritis, unspecified site: Secondary | ICD-10-CM | POA: Diagnosis not present

## 2016-01-02 DIAGNOSIS — L03115 Cellulitis of right lower limb: Secondary | ICD-10-CM | POA: Diagnosis not present

## 2016-01-02 DIAGNOSIS — I509 Heart failure, unspecified: Secondary | ICD-10-CM | POA: Diagnosis not present

## 2016-01-02 DIAGNOSIS — L03116 Cellulitis of left lower limb: Secondary | ICD-10-CM | POA: Diagnosis not present

## 2016-01-02 DIAGNOSIS — M549 Dorsalgia, unspecified: Secondary | ICD-10-CM | POA: Diagnosis not present

## 2016-01-02 DIAGNOSIS — D649 Anemia, unspecified: Secondary | ICD-10-CM | POA: Diagnosis not present

## 2016-01-02 DIAGNOSIS — I4891 Unspecified atrial fibrillation: Secondary | ICD-10-CM | POA: Diagnosis not present

## 2016-01-02 DIAGNOSIS — I872 Venous insufficiency (chronic) (peripheral): Secondary | ICD-10-CM | POA: Diagnosis not present

## 2016-01-03 ENCOUNTER — Telehealth: Payer: Self-pay

## 2016-01-03 NOTE — Telephone Encounter (Signed)
Called [phone] for prior authorization on Relistor. Pending review, will be about 3 days to let us know.

## 2016-01-06 ENCOUNTER — Telehealth: Payer: Self-pay

## 2016-01-06 DIAGNOSIS — I509 Heart failure, unspecified: Secondary | ICD-10-CM | POA: Diagnosis not present

## 2016-01-06 DIAGNOSIS — M1991 Primary osteoarthritis, unspecified site: Secondary | ICD-10-CM | POA: Diagnosis not present

## 2016-01-06 DIAGNOSIS — E1165 Type 2 diabetes mellitus with hyperglycemia: Secondary | ICD-10-CM | POA: Diagnosis not present

## 2016-01-06 DIAGNOSIS — D649 Anemia, unspecified: Secondary | ICD-10-CM | POA: Diagnosis not present

## 2016-01-06 DIAGNOSIS — L03116 Cellulitis of left lower limb: Secondary | ICD-10-CM | POA: Diagnosis not present

## 2016-01-06 DIAGNOSIS — I872 Venous insufficiency (chronic) (peripheral): Secondary | ICD-10-CM | POA: Diagnosis not present

## 2016-01-06 DIAGNOSIS — E1140 Type 2 diabetes mellitus with diabetic neuropathy, unspecified: Secondary | ICD-10-CM | POA: Diagnosis not present

## 2016-01-06 DIAGNOSIS — I4891 Unspecified atrial fibrillation: Secondary | ICD-10-CM | POA: Diagnosis not present

## 2016-01-06 DIAGNOSIS — M549 Dorsalgia, unspecified: Secondary | ICD-10-CM | POA: Diagnosis not present

## 2016-01-06 DIAGNOSIS — L03115 Cellulitis of right lower limb: Secondary | ICD-10-CM | POA: Diagnosis not present

## 2016-01-06 NOTE — Telephone Encounter (Signed)
A notification from [employer] was received that stated the prior authorization for Relistor 150 mg tablets was incomplete. [name] asked that we call the patient to find out if the medication was actually working before we completed the PA paperwork.   I left a message for patient to call the office. I placed the paperwork in my mail box so that it would not get lost.

## 2016-01-06 NOTE — Telephone Encounter (Signed)
The PA paperwork was given to Rodena Piety so she would know where it is.

## 2016-01-06 NOTE — Telephone Encounter (Signed)
Received Fax from [employer] for Prior authorization for Relistor 150mg . Clinical Information needs to be answered. Given to [name] to review and sign. To be faxed back to [facility] Fax: [phone]

## 2016-01-07 DIAGNOSIS — L03115 Cellulitis of right lower limb: Secondary | ICD-10-CM | POA: Diagnosis not present

## 2016-01-07 DIAGNOSIS — D649 Anemia, unspecified: Secondary | ICD-10-CM | POA: Diagnosis not present

## 2016-01-07 DIAGNOSIS — I509 Heart failure, unspecified: Secondary | ICD-10-CM | POA: Diagnosis not present

## 2016-01-07 DIAGNOSIS — I872 Venous insufficiency (chronic) (peripheral): Secondary | ICD-10-CM | POA: Diagnosis not present

## 2016-01-07 DIAGNOSIS — L03116 Cellulitis of left lower limb: Secondary | ICD-10-CM | POA: Diagnosis not present

## 2016-01-07 DIAGNOSIS — E1165 Type 2 diabetes mellitus with hyperglycemia: Secondary | ICD-10-CM | POA: Diagnosis not present

## 2016-01-07 DIAGNOSIS — M549 Dorsalgia, unspecified: Secondary | ICD-10-CM | POA: Diagnosis not present

## 2016-01-07 DIAGNOSIS — E1140 Type 2 diabetes mellitus with diabetic neuropathy, unspecified: Secondary | ICD-10-CM | POA: Diagnosis not present

## 2016-01-07 DIAGNOSIS — M1991 Primary osteoarthritis, unspecified site: Secondary | ICD-10-CM | POA: Diagnosis not present

## 2016-01-07 DIAGNOSIS — I4891 Unspecified atrial fibrillation: Secondary | ICD-10-CM | POA: Diagnosis not present

## 2016-01-08 ENCOUNTER — Ambulatory Visit (INDEPENDENT_AMBULATORY_CARE_PROVIDER_SITE_OTHER): Payer: Medicare Other | Admitting: Internal Medicine

## 2016-01-08 ENCOUNTER — Encounter: Payer: Self-pay | Admitting: Internal Medicine

## 2016-01-08 VITALS — BP 124/76 | HR 69 | Temp 98.1°F | Ht 69.0 in | Wt >= 6400 oz

## 2016-01-08 DIAGNOSIS — R609 Edema, unspecified: Secondary | ICD-10-CM | POA: Diagnosis not present

## 2016-01-08 DIAGNOSIS — E1140 Type 2 diabetes mellitus with diabetic neuropathy, unspecified: Secondary | ICD-10-CM | POA: Diagnosis not present

## 2016-01-08 DIAGNOSIS — E1165 Type 2 diabetes mellitus with hyperglycemia: Secondary | ICD-10-CM

## 2016-01-08 DIAGNOSIS — IMO0002 Reserved for concepts with insufficient information to code with codable children: Secondary | ICD-10-CM

## 2016-01-08 DIAGNOSIS — G5610 Other lesions of median nerve, unspecified upper limb: Secondary | ICD-10-CM

## 2016-01-08 DIAGNOSIS — I10 Essential (primary) hypertension: Secondary | ICD-10-CM | POA: Diagnosis not present

## 2016-01-08 DIAGNOSIS — N183 Chronic kidney disease, stage 3 unspecified: Secondary | ICD-10-CM | POA: Insufficient documentation

## 2016-01-08 DIAGNOSIS — E11622 Type 2 diabetes mellitus with other skin ulcer: Secondary | ICD-10-CM | POA: Diagnosis not present

## 2016-01-08 DIAGNOSIS — D649 Anemia, unspecified: Secondary | ICD-10-CM

## 2016-01-08 DIAGNOSIS — K5900 Constipation, unspecified: Secondary | ICD-10-CM

## 2016-01-08 HISTORY — DX: Chronic kidney disease, stage 3 unspecified: N18.30

## 2016-01-08 MED ORDER — LISINOPRIL 20 MG PO TABS: 20.0000 mg | ORAL_TABLET | Freq: Every day | ORAL | 3 refills | Status: DC

## 2016-01-08 MED ORDER — METHADONE HCL 10 MG PO TABS: ORAL_TABLET | ORAL | 0 refills | Status: DC

## 2016-01-08 NOTE — Progress Notes (Signed)
Facility  [place]    Place of Service:   OFFICE    Allergies  Allergen Reactions  . Adhesive [Tape] Other (See Comments)    Unknown paper tape ok to use   . Feldene [Piroxicam] Other (See Comments)    unknown  . Latex Other (See Comments)    Rash and itching    Chief Complaint  Patient presents with  . Medical Management of Chronic Issues    legs edema (Home Health comes in to wrap legs 3 times a week); Medication management some changes     HPI:  Edema, unspecified type -   DM type 2, uncontrolled, with neuropathy ([place])  Essential hypertension - Patient ran out of lisinopril about 2 weeks ago  Constipation, unspecified constipation type - narcotic induced. Relistor was helpful.  Obesity, morbid ([place]) - gro.ssly overweight. 11# gain sine 11 daiys ago is undoubtedly due to edema increasing  Anemia, unspecified type - needs follw up  CKD (chronic kidney disease) stage 3, GFR 30-59 ml/min - min the BUN and creatinine showed elevation in late September, he was told to reduce metolazone to every other day. He follow those directions. Edema has gotten worse.    Medications: Patient's Medications  New Prescriptions   No medications on file  Previous Medications   ACETAMINOPHEN (TYLENOL) 650 MG CR TABLET    Take 1,950 mg by mouth 2 (two) times daily.   AMLODIPINE (NORVASC) 10 MG TABLET    Take 10 mg by mouth daily.   ATORVASTATIN (LIPITOR) 40 MG TABLET    take 1 tablet once daily   DULOXETINE (CYMBALTA) 60 MG CAPSULE    Take one tablet by mouth twice daily to help neuropathy   GLUCOSE BLOOD (ACCU-CHEK AVIVA PLUS) TEST STRIP    1 each by Other route See admin instructions. Check blood sugar twice daily   INSULIN GLARGINE (LANTUS SOLOSTAR) 100 UNIT/ML SOLOSTAR PEN    Inject 24 Units into the skin daily.   INSULIN PEN NEEDLE (NOVOFINE) 30G X 8 MM MISC    Inject 1 packet into the skin See admin instructions. Use with insulin once daily.   LISINOPRIL (PRINIVIL,ZESTRIL) 20 MG  TABLET    Take 1 tablet (20 mg total) by mouth daily.   LYRICA 150 MG CAPSULE    Take 1 capsule by mouth 3 (three) times daily.   METFORMIN (GLUCOPHAGE) 1000 MG TABLET    Take 1 tablet (1,000 mg total) by mouth 2 (two) times daily with a meal.   METHADONE (DOLOPHINE) 10 MG TABLET    Take One tablet by mouth 4 times daily  to control pain   METHYLNALTREXONE BROMIDE (RELISTOR) 150 MG TABS    Take 450 mg by mouth daily.   METOLAZONE (ZAROXOLYN) 2.5 MG TABLET    Take 1 tablet (2.5 mg total) by mouth daily.   METOPROLOL (LOPRESSOR) 50 MG TABLET    Take 1 tablet (50 mg total) by mouth 2 (two) times daily.   MULTIPLE VITAMIN (MULITIVITAMIN WITH MINERALS) TABS    Take 1 tablet by mouth daily.   NYSTATIN CREAM (MYCOSTATIN)    Apply twice daily to rash   PANTOPRAZOLE (PROTONIX) 40 MG TABLET    Take 1 tablet (40 mg total) by mouth 2 (two) times daily.   POLYETHYLENE GLYCOL (MIRALAX / GLYCOLAX) PACKET    Take 17 g by mouth daily as needed for mild constipation or moderate constipation.   TORSEMIDE (DEMADEX) 100 MG TABLET    Take one  tablet by mouth twice daily   TRAZODONE (DESYREL) 100 MG TABLET    Take 3 tablets (300 mg total) by mouth at bedtime.   TRIAMCINOLONE CREAM (KENALOG) 0.1 %    Apply sparingly up to twice daily to rash until it is resolved  Modified Medications   No medications on file  Discontinued Medications   No medications on file    Review of Systems  Constitutional: Positive for activity change and fatigue. Negative for appetite change, fever and unexpected weight change.       Morbidly obese  HENT: Negative for congestion, ear pain, hearing loss, rhinorrhea, sore throat, tinnitus, trouble swallowing and voice change.   Eyes:       Corrective lenses  Respiratory: Positive for shortness of breath. Negative for cough, choking, chest tightness and wheezing.   Cardiovascular: Positive for chest pain and leg swelling. Negative for palpitations.  Gastrointestinal: Negative for abdominal  distention, abdominal pain, constipation, diarrhea and nausea.  Endocrine: Negative for cold intolerance, heat intolerance, polydipsia, polyphagia and polyuria.  Genitourinary: Negative for dysuria, frequency, testicular pain and urgency.       Not incontinent  Musculoskeletal: Positive for back pain. Negative for arthralgias, gait problem, myalgias and neck pain.       Tender left ankle.  Skin: Positive for rash.       erythema of the anterior shins. History of intertrigo under the breasts and in the groin area bilaterally.  Blotchy, red facial rash present since Spring 2015. Chronic venous stasis changes of both lower legs. Abrasion of the right knee laterally.  Allergic/Immunologic: Negative.   Neurological: Positive for light-headedness and numbness (both hands demonstrate findiings compatible with carpal tunnel syndrome). Negative for dizziness, tremors, syncope, speech difficulty, weakness and headaches.       Wears glove on the right hand due to chronic painful median nerve neuropathy.History of painful neuropathy of the feet and lower legs.  Hematological: Negative for adenopathy. Does not bruise/bleed easily.  Psychiatric/Behavioral: Negative for behavioral problems, confusion, decreased concentration, hallucinations and sleep disturbance. The patient is not nervous/anxious.        Mild memory difficulty    Vitals:   01/08/16 1459  BP: 124/76  Pulse: 69  Temp: 98.1 F (36.7 C)  TempSrc: Oral  SpO2: 94%  Weight: (!) 402 lb (182.3 kg)  Height: '5\' 9"'  (1.753 m)   Body mass index is 59.37 kg/m. Wt Readings from Last 3 Encounters:  01/08/16 (!) 402 lb (182.3 kg)  12/30/15 (!) 391 lb 9.6 oz (177.6 kg)  12/12/15 (!) 409 lb 6.4 oz (185.7 kg)      Physical Exam  Constitutional: He is oriented to person, place, and time. He appears well-developed and well-nourished. No distress.  Morbid obesity  HENT:  Right Ear: External ear normal.  Left Ear: External ear normal.  Nose:  Nose normal.  Mouth/Throat: Oropharynx is clear and moist. No oropharyngeal exudate.  Eyes: Conjunctivae and EOM are normal. Pupils are equal, round, and reactive to light.  Neck: No JVD present. No tracheal deviation present. No thyromegaly present.  Cardiovascular: Normal rate, regular rhythm, normal heart sounds and intact distal pulses.  Exam reveals no gallop and no friction rub.   No murmur heard. Pulmonary/Chest: No respiratory distress. He has no wheezes. He has no rales. He exhibits no tenderness.  Abdominal: He exhibits no distension and no mass. There is no tenderness.  Musculoskeletal: He exhibits edema. He exhibits no tenderness.  Using walker. Unstable gait. Tendeer left ankle.  Lymphadenopathy:    He has no cervical adenopathy.  Neurological: He is alert and oriented to person, place, and time. He has normal reflexes. No cranial nerve deficit. Coordination normal.  08/15/14 MMSE 27/30 09/06/2014  PNCV showed severe bilateral median nerve neuropathy consistent with severe carpal tunnel syndrome.  Skin: There is erythema (both shins). No pallor.  Chronic venous stasis changes of both anterior legs. Abrasion right knee laterally.  Psychiatric: He has a normal mood and affect. His behavior is normal. Judgment and thought content normal.    Labs reviewed: Lab Summary Latest Ref Rng & Units 12/30/2015 12/10/2015 12/02/2015  Hemoglobin 13.2 - 17.1 g/dL (None) (None) 12.4(L)  Hematocrit 38.5 - 50.0 % (None) (None) 37.8(L)  White count 3.8 - 10.8 K/uL (None) (None) 7.5  Platelet count 140 - 400 K/uL (None) (None) 229  Sodium 135 - 146 mmol/L 136 139 137  Potassium 3.5 - 5.3 mmol/L 4.5 4.4 4.3  Calcium 8.6 - 10.3 mg/dL 10.0 9.4 9.6  Phosphorus - (None) (None) (None)  Creatinine 0.70 - 1.25 mg/dL 1.59(H) 1.24 1.60(H)  AST 10 - 35 U/L (None) 23 25  Alk Phos 40 - 115 U/L (None) 56 53  Bilirubin 0.2 - 1.2 mg/dL (None) 0.5 0.3  Glucose 65 - 99 mg/dL 219(H) 161(H) 176(H)  Cholesterol -  (None) (None) (None)  HDL cholesterol - (None) (None) (None)  Triglycerides - (None) (None) (None)  LDL Direct - (None) (None) (None)  LDL Calc - (None) (None) (None)  Total protein 6.1 - 8.1 g/dL (None) 7.1 7.0  Albumin 3.6 - 5.1 g/dL (None) 3.8 3.7  Some recent data might be hidden   Lab Results  Component Value Date   TSH 2.530 03/14/2014   TSH 1.470 12/26/2013   TSH 4.000 09/16/2013   Lab Results  Component Value Date   BUN 36 (H) 12/30/2015   BUN 23 12/10/2015   BUN 30 (H) 12/02/2015   Lab Results  Component Value Date   HGBA1C 7.7 (H) 12/10/2015   HGBA1C 8.4 (H) 02/12/2015   HGBA1C 8.5 (H) 10/11/2014    Assessment/Plan  1. Edema, unspecified type -Use torsemide 100 mg twice daily and resume metolazone 2.5 mg daily - Basic metabolic panel; Future  2. DM type 2, uncontrolled, with neuropathy ([place]) - Basic metabolic panel; Future  3. Essential hypertension - Basic metabolic panel; Future  4. Constipation, unspecified constipation type -samples Relistor 150 mg 3 daily for constipation or Movantik 25 mg daily  5. Obesity, morbid ([place]) Encouraged to lose weight  6. Anemia, unspecified type - CBC with Differential/Platelet; Future  7. CKD (chronic kidney disease) stage 3, GFR 30-59 ml/min -BMP, future

## 2016-01-08 NOTE — Patient Instructions (Addendum)
Take the torsemide 100 mg twice daily.  Take the metolazone 2.5 mg daily. Stop Norvasc (amlodipine).

## 2016-01-09 ENCOUNTER — Telehealth: Payer: Self-pay

## 2016-01-09 DIAGNOSIS — L03116 Cellulitis of left lower limb: Secondary | ICD-10-CM | POA: Diagnosis not present

## 2016-01-09 DIAGNOSIS — I4891 Unspecified atrial fibrillation: Secondary | ICD-10-CM | POA: Diagnosis not present

## 2016-01-09 DIAGNOSIS — D649 Anemia, unspecified: Secondary | ICD-10-CM | POA: Diagnosis not present

## 2016-01-09 DIAGNOSIS — E1140 Type 2 diabetes mellitus with diabetic neuropathy, unspecified: Secondary | ICD-10-CM | POA: Diagnosis not present

## 2016-01-09 DIAGNOSIS — I509 Heart failure, unspecified: Secondary | ICD-10-CM | POA: Diagnosis not present

## 2016-01-09 DIAGNOSIS — L03115 Cellulitis of right lower limb: Secondary | ICD-10-CM | POA: Diagnosis not present

## 2016-01-09 DIAGNOSIS — E1165 Type 2 diabetes mellitus with hyperglycemia: Secondary | ICD-10-CM | POA: Diagnosis not present

## 2016-01-09 DIAGNOSIS — I872 Venous insufficiency (chronic) (peripheral): Secondary | ICD-10-CM | POA: Diagnosis not present

## 2016-01-09 DIAGNOSIS — M549 Dorsalgia, unspecified: Secondary | ICD-10-CM | POA: Diagnosis not present

## 2016-01-09 DIAGNOSIS — M1991 Primary osteoarthritis, unspecified site: Secondary | ICD-10-CM | POA: Diagnosis not present

## 2016-01-09 NOTE — Telephone Encounter (Signed)
Phillip Maldonado called and stated that he needed an order to continue physical therapy for patient. He is going to fax an order form to the office.

## 2016-01-14 DIAGNOSIS — E1140 Type 2 diabetes mellitus with diabetic neuropathy, unspecified: Secondary | ICD-10-CM | POA: Diagnosis not present

## 2016-01-14 DIAGNOSIS — I4891 Unspecified atrial fibrillation: Secondary | ICD-10-CM | POA: Diagnosis not present

## 2016-01-14 DIAGNOSIS — M549 Dorsalgia, unspecified: Secondary | ICD-10-CM | POA: Diagnosis not present

## 2016-01-14 DIAGNOSIS — I509 Heart failure, unspecified: Secondary | ICD-10-CM | POA: Diagnosis not present

## 2016-01-14 DIAGNOSIS — L03115 Cellulitis of right lower limb: Secondary | ICD-10-CM | POA: Diagnosis not present

## 2016-01-14 DIAGNOSIS — E1165 Type 2 diabetes mellitus with hyperglycemia: Secondary | ICD-10-CM | POA: Diagnosis not present

## 2016-01-14 DIAGNOSIS — L03116 Cellulitis of left lower limb: Secondary | ICD-10-CM | POA: Diagnosis not present

## 2016-01-14 DIAGNOSIS — M1991 Primary osteoarthritis, unspecified site: Secondary | ICD-10-CM | POA: Diagnosis not present

## 2016-01-14 DIAGNOSIS — I872 Venous insufficiency (chronic) (peripheral): Secondary | ICD-10-CM | POA: Diagnosis not present

## 2016-01-14 DIAGNOSIS — D649 Anemia, unspecified: Secondary | ICD-10-CM | POA: Diagnosis not present

## 2016-01-21 ENCOUNTER — Telehealth: Payer: Self-pay | Admitting: Internal Medicine

## 2016-01-21 DIAGNOSIS — D649 Anemia, unspecified: Secondary | ICD-10-CM | POA: Diagnosis not present

## 2016-01-21 DIAGNOSIS — M549 Dorsalgia, unspecified: Secondary | ICD-10-CM | POA: Diagnosis not present

## 2016-01-21 DIAGNOSIS — L03116 Cellulitis of left lower limb: Secondary | ICD-10-CM | POA: Diagnosis not present

## 2016-01-21 DIAGNOSIS — L03115 Cellulitis of right lower limb: Secondary | ICD-10-CM | POA: Diagnosis not present

## 2016-01-21 DIAGNOSIS — I509 Heart failure, unspecified: Secondary | ICD-10-CM | POA: Diagnosis not present

## 2016-01-21 DIAGNOSIS — E1165 Type 2 diabetes mellitus with hyperglycemia: Secondary | ICD-10-CM | POA: Diagnosis not present

## 2016-01-21 DIAGNOSIS — M1991 Primary osteoarthritis, unspecified site: Secondary | ICD-10-CM | POA: Diagnosis not present

## 2016-01-21 DIAGNOSIS — I4891 Unspecified atrial fibrillation: Secondary | ICD-10-CM | POA: Diagnosis not present

## 2016-01-21 DIAGNOSIS — E1140 Type 2 diabetes mellitus with diabetic neuropathy, unspecified: Secondary | ICD-10-CM | POA: Diagnosis not present

## 2016-01-21 DIAGNOSIS — I872 Venous insufficiency (chronic) (peripheral): Secondary | ICD-10-CM | POA: Diagnosis not present

## 2016-01-21 NOTE — Telephone Encounter (Signed)
left msg asking pt to confirm if this AWV appt w/ nurse. also moved lab appt so same day as AWV VDM (DD)

## 2016-01-23 DIAGNOSIS — I509 Heart failure, unspecified: Secondary | ICD-10-CM | POA: Diagnosis not present

## 2016-01-23 DIAGNOSIS — I872 Venous insufficiency (chronic) (peripheral): Secondary | ICD-10-CM | POA: Diagnosis not present

## 2016-01-23 DIAGNOSIS — M549 Dorsalgia, unspecified: Secondary | ICD-10-CM | POA: Diagnosis not present

## 2016-01-23 DIAGNOSIS — E1140 Type 2 diabetes mellitus with diabetic neuropathy, unspecified: Secondary | ICD-10-CM | POA: Diagnosis not present

## 2016-01-23 DIAGNOSIS — E1165 Type 2 diabetes mellitus with hyperglycemia: Secondary | ICD-10-CM | POA: Diagnosis not present

## 2016-01-23 DIAGNOSIS — L03116 Cellulitis of left lower limb: Secondary | ICD-10-CM | POA: Diagnosis not present

## 2016-01-23 DIAGNOSIS — M1991 Primary osteoarthritis, unspecified site: Secondary | ICD-10-CM | POA: Diagnosis not present

## 2016-01-23 DIAGNOSIS — L03115 Cellulitis of right lower limb: Secondary | ICD-10-CM | POA: Diagnosis not present

## 2016-01-23 DIAGNOSIS — D649 Anemia, unspecified: Secondary | ICD-10-CM | POA: Diagnosis not present

## 2016-01-23 DIAGNOSIS — I4891 Unspecified atrial fibrillation: Secondary | ICD-10-CM | POA: Diagnosis not present

## 2016-01-24 ENCOUNTER — Other Ambulatory Visit: Payer: Self-pay | Admitting: Nurse Practitioner

## 2016-01-28 ENCOUNTER — Other Ambulatory Visit: Payer: Medicare Other

## 2016-01-28 ENCOUNTER — Ambulatory Visit (INDEPENDENT_AMBULATORY_CARE_PROVIDER_SITE_OTHER): Payer: Medicare Other

## 2016-01-28 VITALS — BP 148/86 | HR 107 | Temp 98.2°F | Ht 69.0 in | Wt 397.4 lb

## 2016-01-28 DIAGNOSIS — Z0000 Encounter for general adult medical examination without abnormal findings: Secondary | ICD-10-CM | POA: Diagnosis not present

## 2016-01-28 DIAGNOSIS — L03116 Cellulitis of left lower limb: Secondary | ICD-10-CM | POA: Diagnosis not present

## 2016-01-28 DIAGNOSIS — D649 Anemia, unspecified: Secondary | ICD-10-CM | POA: Diagnosis not present

## 2016-01-28 DIAGNOSIS — E1140 Type 2 diabetes mellitus with diabetic neuropathy, unspecified: Secondary | ICD-10-CM

## 2016-01-28 DIAGNOSIS — I509 Heart failure, unspecified: Secondary | ICD-10-CM | POA: Diagnosis not present

## 2016-01-28 DIAGNOSIS — I872 Venous insufficiency (chronic) (peripheral): Secondary | ICD-10-CM | POA: Diagnosis not present

## 2016-01-28 DIAGNOSIS — M1991 Primary osteoarthritis, unspecified site: Secondary | ICD-10-CM | POA: Diagnosis not present

## 2016-01-28 DIAGNOSIS — I4891 Unspecified atrial fibrillation: Secondary | ICD-10-CM | POA: Diagnosis not present

## 2016-01-28 DIAGNOSIS — R609 Edema, unspecified: Secondary | ICD-10-CM | POA: Diagnosis not present

## 2016-01-28 DIAGNOSIS — I10 Essential (primary) hypertension: Secondary | ICD-10-CM

## 2016-01-28 DIAGNOSIS — M549 Dorsalgia, unspecified: Secondary | ICD-10-CM | POA: Diagnosis not present

## 2016-01-28 DIAGNOSIS — IMO0002 Reserved for concepts with insufficient information to code with codable children: Secondary | ICD-10-CM

## 2016-01-28 DIAGNOSIS — E1165 Type 2 diabetes mellitus with hyperglycemia: Secondary | ICD-10-CM | POA: Diagnosis not present

## 2016-01-28 DIAGNOSIS — L03115 Cellulitis of right lower limb: Secondary | ICD-10-CM | POA: Diagnosis not present

## 2016-01-28 LAB — CBC WITH DIFFERENTIAL/PLATELET
BASOS ABS: 68 {cells}/uL (ref 0–200)
Basophils Relative: 1 %
EOS ABS: 408 {cells}/uL (ref 15–500)
EOS PCT: 6 %
HEMATOCRIT: 39.6 % (ref 38.5–50.0)
HEMOGLOBIN: 13.1 g/dL — AB (ref 13.2–17.1)
LYMPHS ABS: 2516 {cells}/uL (ref 850–3900)
Lymphocytes Relative: 37 %
MCH: 29 pg (ref 27.0–33.0)
MCHC: 33.1 g/dL (ref 32.0–36.0)
MCV: 87.6 fL (ref 80.0–100.0)
MONO ABS: 544 {cells}/uL (ref 200–950)
MPV: 10.4 fL (ref 7.5–12.5)
Monocytes Relative: 8 %
NEUTROS ABS: 3264 {cells}/uL (ref 1500–7800)
NEUTROS PCT: 48 %
Platelets: 209 10*3/uL (ref 140–400)
RBC: 4.52 MIL/uL (ref 4.20–5.80)
RDW: 16 % — ABNORMAL HIGH (ref 11.0–15.0)
WBC: 6.8 10*3/uL (ref 3.8–10.8)

## 2016-01-28 LAB — BASIC METABOLIC PANEL
BUN: 22 mg/dL (ref 7–25)
CALCIUM: 9.5 mg/dL (ref 8.6–10.3)
CO2: 33 mmol/L — AB (ref 20–31)
Chloride: 97 mmol/L — ABNORMAL LOW (ref 98–110)
Creat: 1.16 mg/dL (ref 0.70–1.25)
GLUCOSE: 173 mg/dL — AB (ref 65–99)
POTASSIUM: 4.1 mmol/L (ref 3.5–5.3)
SODIUM: 140 mmol/L (ref 135–146)

## 2016-01-28 NOTE — Progress Notes (Signed)
Subjective:   Phillip Maldonado is a 65 y.o. male who presents for an Initial Medicare Annual Wellness Visit.  Review of Systems  Cardiac Risk Factors include: advanced age (>28men, >62 women);diabetes mellitus;male gender;obesity (BMI >30kg/m2);sedentary lifestyle;hypertension    Objective:    Today's Vitals   01/28/16 0929  BP: (!) 148/86  Pulse: (!) 107  Temp: 98.2 F (36.8 C)  TempSrc: Oral  SpO2: 97%  Weight: (!) 397 lb 6.4 oz (180.3 kg)  Height: 5\' 9"  (1.753 m)  PainSc: 8    Body mass index is 58.69 kg/m.  Current Medications (verified) Outpatient Encounter Prescriptions as of 01/28/2016  Medication Sig  . acetaminophen (TYLENOL) 650 MG CR tablet Take 1,950 mg by mouth 2 (two) times daily.  Marland Kitchen amLODipine (NORVASC) 10 MG tablet Take 10 mg by mouth daily.  Marland Kitchen atorvastatin (LIPITOR) 40 MG tablet take 1 tablet once daily  . DULoxetine (CYMBALTA) 60 MG capsule Take one tablet by mouth twice daily to help neuropathy  . glucose blood (ACCU-CHEK AVIVA PLUS) test strip 1 each by Other route See admin instructions. Check blood sugar twice daily  . Insulin Glargine (LANTUS SOLOSTAR) 100 UNIT/ML Solostar Pen Inject 24 Units into the skin daily.  Marland Kitchen lisinopril (PRINIVIL,ZESTRIL) 20 MG tablet Take 1 tablet (20 mg total) by mouth daily.  Marland Kitchen LYRICA 150 MG capsule Take 1 capsule by mouth 3 (three) times daily.  . metFORMIN (GLUCOPHAGE) 1000 MG tablet Take 1 tablet (1,000 mg total) by mouth 2 (two) times daily with a meal.  . methadone (DOLOPHINE) 10 MG tablet Take One tablet by mouth 4 times daily  to control pain  . Methylnaltrexone Bromide (RELISTOR) 150 MG TABS Take 450 mg by mouth daily.  . metolazone (ZAROXOLYN) 2.5 MG tablet TAKE 1 TABLET(2.5 MG) BY MOUTH DAILY  . metoprolol (LOPRESSOR) 50 MG tablet Take 1 tablet (50 mg total) by mouth 2 (two) times daily.  . Multiple Vitamin (MULITIVITAMIN WITH MINERALS) TABS Take 1 tablet by mouth daily.  Marland Kitchen nystatin cream (MYCOSTATIN) Apply  twice daily to rash  . pantoprazole (PROTONIX) 40 MG tablet Take 1 tablet (40 mg total) by mouth 2 (two) times daily.  . polyethylene glycol (MIRALAX / GLYCOLAX) packet Take 17 g by mouth daily as needed for mild constipation or moderate constipation.  . torsemide (DEMADEX) 100 MG tablet Take one tablet by mouth twice daily  . traZODone (DESYREL) 100 MG tablet Take 3 tablets (300 mg total) by mouth at bedtime.  . triamcinolone cream (KENALOG) 0.1 % Apply sparingly up to twice daily to rash until it is resolved  . [DISCONTINUED] Insulin Pen Needle (NOVOFINE) 30G X 8 MM MISC Inject 1 packet into the skin See admin instructions. Use with insulin once daily.   No facility-administered encounter medications on file as of 01/28/2016.     Allergies (verified) Adhesive [tape]; Feldene [piroxicam]; and Latex   History: Past Medical History:  Diagnosis Date  . Anemia   . Angina   . Anxiety   . Arthritis   . Atrial fibrillation with RVR ([place]) 01/24/2013  . Blood transfusion    last 10' 14- "GI bleed"  . CHF (congestive heart failure) ([place])   . CKD (chronic kidney disease) stage 3, GFR 30-59 ml/min 01/08/2016  . Depression   . Diabetes mellitus ([place]) 09/18/2011  . Diverticulosis   . DJD (degenerative joint disease)   . DM type 2, uncontrolled, with neuropathy ([place]) 12/26/2013  . Edema extremities    bilateral lower  extremities-"weepy areas due to fluid retention"  . Fatty liver   . Fundic gland polyps of stomach, benign   . Headache(784.0)   . Hepatitis B   . History of alcohol abuse    last drink in 1993  . Hypertension   . Neuromuscular disorder ([place])   . 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 Obesity   . Obesity, morbid ([place]) 07/30/2015  . Pneumonia    not at present time  . Renal insufficiency   . Shortness of breath   . Sleep apnea, obstructive    does where bipap. 06-04-14 "doesn't use much now, no mask/tubing now.   Past Surgical History:  Procedure Laterality  Date  . ABDOMINAL SURGERY     [phone]  . BACK SURGERY     x 8 -,multiple fusions(cervical to lumbar)  . COLONOSCOPY    . COLONOSCOPY WITH PROPOFOL N/A 06/14/2014   Procedure: COLONOSCOPY WITH PROPOFOL;  Surgeon: Gatha Mayer, MD;  Location: [facility] END[facility]COPY;  Service: Endoscopy;  Laterality: N/A;  . DIAGNOSTIC LAPAROSCOPY    . ESOPHAGOGASTRODUODENOSCOPY N/A 01/03/2013   Procedure: ESOPHAGOGASTRODUODENOSCOPY (EGD);  Surgeon: Arta Silence, MD;  Location: [facility] OR[facility];  Service: Endoscopy;  Laterality: N/A;  . ESOPHAGOGASTRODUODENOSCOPY N/A 01/03/2013   Procedure: ESOPHAGOGASTRODUODENOSCOPY (EGD);  Surgeon: Arta Silence, MD;  Location: [facility];  Service: Endoscopy;  Laterality: N/A;  . ESOPHAGOGASTRODUODENOSCOPY N/A 01/23/2013   Procedure: ESOPHAGOGASTRODUODENOSCOPY (EGD);  Surgeon: Lear Ng, MD;  Location: [facility] ENDOSCOPY;  Service: Endoscopy;  Laterality: N/A;  . ESOPHAGOGASTRODUODENOSCOPY Left 01/27/2013   Procedure: ESOPHAGOGASTRODUODENOSCOPY (EGD);  Surgeon: Lear Ng, MD;  Location: [facility];  Service: Endoscopy;  Laterality: Left;  . ESOPHAGOGASTRODUODENOSCOPY N/A 06/14/2014   Procedure: ESOPHAGOGASTRODUODENOSCOPY (EGD);  Surgeon: Gatha Mayer, MD;  Location: [facility];  Service: Endoscopy;  Laterality: N/A;  . ESOPHAGOSCOPY N/A 01/01/2013   Procedure: ESOPHAGOSCOPY;  Surgeon: Jeryl [place], MD;  Location: [facility];  Service: Endoscopy;  Laterality: N/A;  . GASTRIC BYPASS  1977   Reversed in 1992 and revision 1994  . HYDROCELE EXCISION  1996   x 2   . KNEE SURGERY Left 2004   Family History  Problem Relation Age of Onset  . Heart disease Father   . Skin cancer Father   . COPD Father   . Hypertension Father   . Stroke Father   . Arthritis Mother   . Osteoporosis Mother   . Diabetes Daughter   . Depression Son   . Heart disease Maternal Uncle   . High blood pressure Sister   . High blood pressure Sister   . High blood pressure Son   . Depression Son     . Arthritis Sister   . Arthritis Sister   . Cancer Maternal Grandmother   . Heart disease Maternal Uncle   . Heart disease Paternal Uncle    Social History   Occupational History  . DISABLED    Social History Main Topics  . Smoking status: Never Smoker  . Smokeless tobacco: Never Used  . Alcohol use No  . Drug use: No  . Sexual activity: Not Currently   Tobacco Counseling Counseling given: No   Activities of Daily Living In your present state of health, do you have any difficulty performing the following activities: 01/28/2016 09/05/2015  Hearing? Y N  Vision? Y N  Difficulty concentrating or making decisions? Y N  Walking or climbing stairs? Y Y  Dressing or bathing? N N  Doing errands, shopping? N  Y  Preparing Food and eating ? N N  Using the Toilet? N N  In the past six months, have you accidently leaked urine? Y N  Do you have problems with loss of bowel control? N N  Managing your Medications? N N  Managing your Finances? N N  Housekeeping or managing your Housekeeping? N Y  Some recent data might be hidden    Immunizations and Health Maintenance Immunization History  Administered Date(s) Administered  . Influenza,inj,Quad PF,36+ Mos 01/05/2013, 12/26/2014, 12/02/2015  . Influenza-Unspecified 12/22/2013  . Pneumococcal Polysaccharide-23 01/05/2013  . Tdap 09/11/2014  . Zoster 09/24/2014   Health Maintenance Due  Topic Date Due  . OPHTHALMOLOGY EXAM  02/20/1961  . HIV Screening  02/20/1966  . FOOT EXAM  07/24/2015    Patient Care Team: Estill Dooms, MD as PCP - General (Internal Medicine)  Indicate any recent Medical Services you may have received from other than [facility] providers in the past year (date may be approximate).    Assessment:   This is a routine wellness examination for Phillip Maldonado.  Hearing/Vision screen Hearing Screening Comments: Last hearing screen done 3 yrs ago. Had bilateral hearing aids, lost one and does not wear the other one.   Vision Screening Comments: Last eye exam 2016. Due for one.  Dietary issues and exercise activities discussed: Current Exercise Habits: The patient does not participate in regular exercise at present, Exercise limited by: cardiac condition(s)  Goals    . Weight (lb) < 350 lb (158.8 kg)          Starting 01/28/16, I will attempt to decrease my weight down to 350lb.       Depression Screen PHQ 2/9 Scores 01/28/2016 09/05/2015 08/06/2015 08/06/2015  PHQ - 2 Score 0 0 2 2  PHQ- 9 Score - - 3 5    Fall Risk Fall Risk  01/28/2016 12/30/2015 12/12/2015 12/02/2015 09/26/2015  Falls in the past year? Yes No No No Yes  Number falls in past yr: 1 - - - 1  Injury with Fall? Yes - - - Yes  Risk Factor Category  - - - - High Fall Risk  Risk for fall due to : Medication side effect - - - -  Risk for fall due to (comments): Pt states a lot of his meds will cause dizziness.  - - - -  Follow up Falls prevention discussed - - - Falls evaluation completed    Cognitive Function: MMSE - Mini Mental State Exam 01/28/2016 08/15/2014  Orientation to time 3 4  Orientation to Place 5 5  Registration 3 3  Attention/ Calculation 5 5  Recall 3 1  Language- name 2 objects 2 2  Language- repeat 1 1  Language- follow 3 step command 3 3  Language- read & follow direction 1 1  Write a sentence 1 1  Copy design 1 1  Total score 28 27        Screening Tests Health Maintenance  Topic Date Due  . OPHTHALMOLOGY EXAM  02/20/1961  . HIV Screening  02/20/1966  . FOOT EXAM  07/24/2015  . HEMOGLOBIN A1C  06/08/2016  . COLONOSCOPY  06/13/2024  . TETANUS/TDAP  09/10/2024  . INFLUENZA VACCINE  Completed  . ZOSTAVAX  Completed  . Hepatitis C Screening  Completed        Plan:    I have personally reviewed and addressed the Medicare Annual Wellness questionnaire and have noted the following in the patient's chart:  A.  Medical and social history B. Use of alcohol, tobacco or illicit drugs  C. Current  medications and supplements D. Functional ability and status E.  Nutritional status F.  Physical activity G. Advance directives H. List of other physicians I.  Hospitalizations, surgeries, and ER visits in previous 12 months J.  [place] to include hearing, vision, cognitive, depression L. Referrals and appointments - none  In addition, I have reviewed and discussed with patient certain preventive protocols, quality metrics, and best practice recommendations. A written personalized care plan for preventive services as well as general preventive health recommendations were provided to patient.  See attached scanned questionnaire for additional information.   Signed,   Allyn Kenner, LPN Health Advisor  I reviewed health advisors note, was available for consultation and agree with documentation and plan.  Carlos American. Harle Battiest  [facility] [phone] 8 am - 5 pm) [phone] (after hours)

## 2016-01-28 NOTE — Patient Instructions (Signed)
Phillip Maldonado , Thank you for taking time to come for your Medicare Wellness Visit. I appreciate your ongoing commitment to your health goals. Please review the following plan we discussed and let me know if I can assist you in the future.   These are the goals we discussed: Goals    . Weight (lb) < 350 lb (158.8 kg)          Starting 01/28/16, I will attempt to decrease my weight down to 350lb.        This is a list of the screening recommended for you and due dates:  Health Maintenance  Topic Date Due  . Eye exam for diabetics  02/20/1961  . HIV Screening  02/20/1966  . Complete foot exam   07/24/2015  . Hemoglobin A1C  06/08/2016  . Colon Cancer Screening  06/13/2024  . Tetanus Vaccine  09/10/2024  . Flu Shot  Completed  . Shingles Vaccine  Completed  .  Hepatitis C: One time screening is recommended by Center for Disease Control  (CDC) for  adults born from [birth year] through 1965.   Completed  Preventive Care for Adults  A healthy lifestyle and preventive care can promote health and wellness. Preventive health guidelines for adults include the following key practices.  . A routine yearly physical is a good way to check with your health care provider about your health and preventive screening. It is a chance to share any concerns and updates on your health and to receive a thorough exam.  . Visit your dentist for a routine exam and preventive care every 6 months. Brush your teeth twice a day and floss once a day. Good oral hygiene prevents tooth decay and gum disease.  . The frequency of eye exams is based on your age, health, family medical history, use  of contact lenses, and other factors. Follow your health care provider's ecommendations for frequency of eye exams.  . Eat a healthy diet. Foods like vegetables, fruits, whole grains, low-fat dairy products, and lean protein foods contain the nutrients you need without too many calories. Decrease your intake of foods high in solid  fats, added sugars, and salt. Eat the right amount of calories for you. Get information about a proper diet from your health care provider, if necessary.  . Regular physical exercise is one of the most important things you can do for your health. Most adults should get at least 150 minutes of moderate-intensity exercise (any activity that increases your heart rate and causes you to sweat) each week. In addition, most adults need muscle-strengthening exercises on 2 or more days a week.  Silver Sneakers may be a benefit available to you. To determine eligibility, you may visit the website: www.silversneakers.com or contact program at [phone] Mon-Fri between 8AM-8PM.   . Maintain a healthy weight. The body mass index (BMI) is a screening tool to identify possible weight problems. It provides an estimate of body fat based on height and weight. Your health care provider can find your BMI and can help you achieve or maintain a healthy weight.   For adults 20 years and older: ? A BMI below 18.5 is considered underweight. ? A BMI of 18.5 to 24.9 is normal. ? A BMI of 25 to 29.9 is considered overweight. ? A BMI of 30 and above is considered obese.   . Maintain normal blood lipids and cholesterol levels by exercising and minimizing your intake of saturated fat. Eat a balanced diet with plenty of  fruit and vegetables. Blood tests for lipids and cholesterol should begin at age 11 and be repeated every 5 years. If your lipid or cholesterol levels are high, you are over 50, or you are at high risk for heart disease, you may need your cholesterol levels checked more frequently. Ongoing high lipid and cholesterol levels should be treated with medicines if diet and exercise are not working.  . If you smoke, find out from your health care provider how to quit. If you do not use tobacco, please do not start.  . If you choose to drink alcohol, please do not consume more than 2 drinks per day. One drink is  considered to be 12 ounces (355 mL) of beer, 5 ounces (148 mL) of wine, or 1.5 ounces (44 mL) of liquor.  . If you are 36-35 years old, ask your health care provider if you should take aspirin to prevent strokes.  . Use sunscreen. Apply sunscreen liberally and repeatedly throughout the day. You should seek shade when your shadow is shorter than you. Protect yourself by wearing long sleeves, pants, a wide-brimmed hat, and sunglasses year round, whenever you are outdoors.  . Once a month, do a whole body skin exam, using a mirror to look at the skin on your back. Tell your health care provider of new moles, moles that have irregular borders, moles that are larger than a pencil eraser, or moles that have changed in shape or color.

## 2016-01-28 NOTE — Progress Notes (Signed)
Quick Notes   Health Maintenance:   Due for Eye Exams, Foot Screen and HIV draw   Abnormal Screen:  None, 28/30 Passed clock screen   Patient Concerns:   Would like to lose weight  Nurse Concerns:   None

## 2016-01-29 ENCOUNTER — Other Ambulatory Visit: Payer: Self-pay | Admitting: Internal Medicine

## 2016-01-29 DIAGNOSIS — F3289 Other specified depressive episodes: Secondary | ICD-10-CM

## 2016-01-30 DIAGNOSIS — E1140 Type 2 diabetes mellitus with diabetic neuropathy, unspecified: Secondary | ICD-10-CM | POA: Diagnosis not present

## 2016-01-30 DIAGNOSIS — M549 Dorsalgia, unspecified: Secondary | ICD-10-CM | POA: Diagnosis not present

## 2016-01-30 DIAGNOSIS — D649 Anemia, unspecified: Secondary | ICD-10-CM | POA: Diagnosis not present

## 2016-01-30 DIAGNOSIS — L03116 Cellulitis of left lower limb: Secondary | ICD-10-CM | POA: Diagnosis not present

## 2016-01-30 DIAGNOSIS — I4891 Unspecified atrial fibrillation: Secondary | ICD-10-CM | POA: Diagnosis not present

## 2016-01-30 DIAGNOSIS — L03115 Cellulitis of right lower limb: Secondary | ICD-10-CM | POA: Diagnosis not present

## 2016-01-30 DIAGNOSIS — I872 Venous insufficiency (chronic) (peripheral): Secondary | ICD-10-CM | POA: Diagnosis not present

## 2016-01-30 DIAGNOSIS — I509 Heart failure, unspecified: Secondary | ICD-10-CM | POA: Diagnosis not present

## 2016-01-30 DIAGNOSIS — M1991 Primary osteoarthritis, unspecified site: Secondary | ICD-10-CM | POA: Diagnosis not present

## 2016-01-30 DIAGNOSIS — E1165 Type 2 diabetes mellitus with hyperglycemia: Secondary | ICD-10-CM | POA: Diagnosis not present

## 2016-01-31 ENCOUNTER — Other Ambulatory Visit: Payer: Medicare Other

## 2016-02-03 DIAGNOSIS — E1165 Type 2 diabetes mellitus with hyperglycemia: Secondary | ICD-10-CM | POA: Diagnosis not present

## 2016-02-03 DIAGNOSIS — M549 Dorsalgia, unspecified: Secondary | ICD-10-CM | POA: Diagnosis not present

## 2016-02-03 DIAGNOSIS — E1140 Type 2 diabetes mellitus with diabetic neuropathy, unspecified: Secondary | ICD-10-CM | POA: Diagnosis not present

## 2016-02-03 DIAGNOSIS — I872 Venous insufficiency (chronic) (peripheral): Secondary | ICD-10-CM | POA: Diagnosis not present

## 2016-02-03 DIAGNOSIS — L03116 Cellulitis of left lower limb: Secondary | ICD-10-CM | POA: Diagnosis not present

## 2016-02-03 DIAGNOSIS — D649 Anemia, unspecified: Secondary | ICD-10-CM | POA: Diagnosis not present

## 2016-02-03 DIAGNOSIS — L03115 Cellulitis of right lower limb: Secondary | ICD-10-CM | POA: Diagnosis not present

## 2016-02-03 DIAGNOSIS — M1991 Primary osteoarthritis, unspecified site: Secondary | ICD-10-CM | POA: Diagnosis not present

## 2016-02-03 DIAGNOSIS — I4891 Unspecified atrial fibrillation: Secondary | ICD-10-CM | POA: Diagnosis not present

## 2016-02-03 DIAGNOSIS — I509 Heart failure, unspecified: Secondary | ICD-10-CM | POA: Diagnosis not present

## 2016-02-04 DIAGNOSIS — M549 Dorsalgia, unspecified: Secondary | ICD-10-CM | POA: Diagnosis not present

## 2016-02-04 DIAGNOSIS — I872 Venous insufficiency (chronic) (peripheral): Secondary | ICD-10-CM | POA: Diagnosis not present

## 2016-02-04 DIAGNOSIS — D649 Anemia, unspecified: Secondary | ICD-10-CM | POA: Diagnosis not present

## 2016-02-04 DIAGNOSIS — L03116 Cellulitis of left lower limb: Secondary | ICD-10-CM | POA: Diagnosis not present

## 2016-02-04 DIAGNOSIS — I4891 Unspecified atrial fibrillation: Secondary | ICD-10-CM | POA: Diagnosis not present

## 2016-02-04 DIAGNOSIS — M1991 Primary osteoarthritis, unspecified site: Secondary | ICD-10-CM | POA: Diagnosis not present

## 2016-02-04 DIAGNOSIS — I509 Heart failure, unspecified: Secondary | ICD-10-CM | POA: Diagnosis not present

## 2016-02-04 DIAGNOSIS — L03115 Cellulitis of right lower limb: Secondary | ICD-10-CM | POA: Diagnosis not present

## 2016-02-04 DIAGNOSIS — E1165 Type 2 diabetes mellitus with hyperglycemia: Secondary | ICD-10-CM | POA: Diagnosis not present

## 2016-02-04 DIAGNOSIS — E1140 Type 2 diabetes mellitus with diabetic neuropathy, unspecified: Secondary | ICD-10-CM | POA: Diagnosis not present

## 2016-02-05 ENCOUNTER — Ambulatory Visit: Payer: Medicare Other | Admitting: Internal Medicine

## 2016-02-05 ENCOUNTER — Encounter: Payer: Self-pay | Admitting: Internal Medicine

## 2016-02-05 DIAGNOSIS — I872 Venous insufficiency (chronic) (peripheral): Secondary | ICD-10-CM | POA: Diagnosis not present

## 2016-02-05 DIAGNOSIS — M549 Dorsalgia, unspecified: Secondary | ICD-10-CM | POA: Diagnosis not present

## 2016-02-05 DIAGNOSIS — L03116 Cellulitis of left lower limb: Secondary | ICD-10-CM | POA: Diagnosis not present

## 2016-02-05 DIAGNOSIS — L03115 Cellulitis of right lower limb: Secondary | ICD-10-CM | POA: Diagnosis not present

## 2016-02-05 DIAGNOSIS — E1165 Type 2 diabetes mellitus with hyperglycemia: Secondary | ICD-10-CM | POA: Diagnosis not present

## 2016-02-05 DIAGNOSIS — I509 Heart failure, unspecified: Secondary | ICD-10-CM | POA: Diagnosis not present

## 2016-02-05 DIAGNOSIS — I4891 Unspecified atrial fibrillation: Secondary | ICD-10-CM | POA: Diagnosis not present

## 2016-02-05 DIAGNOSIS — D649 Anemia, unspecified: Secondary | ICD-10-CM | POA: Diagnosis not present

## 2016-02-05 DIAGNOSIS — M1991 Primary osteoarthritis, unspecified site: Secondary | ICD-10-CM | POA: Diagnosis not present

## 2016-02-05 DIAGNOSIS — E1140 Type 2 diabetes mellitus with diabetic neuropathy, unspecified: Secondary | ICD-10-CM | POA: Diagnosis not present

## 2016-02-07 DIAGNOSIS — L03115 Cellulitis of right lower limb: Secondary | ICD-10-CM | POA: Diagnosis not present

## 2016-02-07 DIAGNOSIS — I509 Heart failure, unspecified: Secondary | ICD-10-CM | POA: Diagnosis not present

## 2016-02-07 DIAGNOSIS — E1140 Type 2 diabetes mellitus with diabetic neuropathy, unspecified: Secondary | ICD-10-CM | POA: Diagnosis not present

## 2016-02-07 DIAGNOSIS — L03116 Cellulitis of left lower limb: Secondary | ICD-10-CM | POA: Diagnosis not present

## 2016-02-12 ENCOUNTER — Other Ambulatory Visit: Payer: Self-pay | Admitting: *Deleted

## 2016-02-12 DIAGNOSIS — G5610 Other lesions of median nerve, unspecified upper limb: Secondary | ICD-10-CM

## 2016-02-12 MED ORDER — METHADONE HCL 10 MG PO TABS
ORAL_TABLET | ORAL | 0 refills | Status: DC
Start: 2016-02-12 — End: 2016-02-12

## 2016-02-12 MED ORDER — METHADONE HCL 10 MG PO TABS
ORAL_TABLET | ORAL | 0 refills | Status: DC
Start: 2016-02-12 — End: 2016-03-17

## 2016-02-12 MED ORDER — METHADONE HCL 10 MG PO TABS: ORAL_TABLET | ORAL | 0 refills | Status: DC

## 2016-02-12 NOTE — Telephone Encounter (Signed)
Patient requested and will pick up 

## 2016-02-13 DIAGNOSIS — I4891 Unspecified atrial fibrillation: Secondary | ICD-10-CM | POA: Diagnosis not present

## 2016-02-13 DIAGNOSIS — I872 Venous insufficiency (chronic) (peripheral): Secondary | ICD-10-CM | POA: Diagnosis not present

## 2016-02-13 DIAGNOSIS — M549 Dorsalgia, unspecified: Secondary | ICD-10-CM | POA: Diagnosis not present

## 2016-02-13 DIAGNOSIS — G4733 Obstructive sleep apnea (adult) (pediatric): Secondary | ICD-10-CM | POA: Diagnosis not present

## 2016-02-13 DIAGNOSIS — E1140 Type 2 diabetes mellitus with diabetic neuropathy, unspecified: Secondary | ICD-10-CM | POA: Diagnosis not present

## 2016-02-13 DIAGNOSIS — D649 Anemia, unspecified: Secondary | ICD-10-CM | POA: Diagnosis not present

## 2016-02-13 DIAGNOSIS — L03115 Cellulitis of right lower limb: Secondary | ICD-10-CM | POA: Diagnosis not present

## 2016-02-13 DIAGNOSIS — I509 Heart failure, unspecified: Secondary | ICD-10-CM | POA: Diagnosis not present

## 2016-02-13 DIAGNOSIS — M1991 Primary osteoarthritis, unspecified site: Secondary | ICD-10-CM | POA: Diagnosis not present

## 2016-02-13 DIAGNOSIS — L03116 Cellulitis of left lower limb: Secondary | ICD-10-CM | POA: Diagnosis not present

## 2016-02-17 ENCOUNTER — Telehealth: Payer: Self-pay | Admitting: *Deleted

## 2016-02-17 NOTE — Telephone Encounter (Signed)
Noted- will await police reports prior to granting another Rx.ifhe begins to have significant sx's, recommend ED eval

## 2016-02-17 NOTE — Telephone Encounter (Signed)
LMOM for patient to return call.

## 2016-02-17 NOTE — Telephone Encounter (Signed)
LMOM to return call.

## 2016-02-17 NOTE — Telephone Encounter (Signed)
Patient called and stated that he came by Friday and picked up his Methadone Rx and then got it filled at the pharmacy. He went and picked up some groceries and went home. He took the medication out of the car and went to get his groceries out of the back of SUV and laid his Rx on the bumper. He took his groceries inside and came back out to get more and to get his Rx and it was gone. In the short time he took them in and came back to the car someone took the Rx. He had taken 2 of the pills from the bottle of #120. Now he has none and has not taken in several days now. Patient called the [facility] and left message and no one returned his call. Patient is worried because it has his name on the bottle. Patient has checked the whole car over along with the cloths he had on and no medication. Patient is going to call the police and file a police Report. Patient stated that he is getting a bad case of shaking and jitters and couldn't sleep last night. Requesting another Rx. Please Advise.

## 2016-02-17 NOTE — Telephone Encounter (Signed)
Patient has found Rx. Disregard message.

## 2016-02-26 DIAGNOSIS — L03115 Cellulitis of right lower limb: Secondary | ICD-10-CM | POA: Diagnosis not present

## 2016-02-26 DIAGNOSIS — L03116 Cellulitis of left lower limb: Secondary | ICD-10-CM | POA: Diagnosis not present

## 2016-02-26 DIAGNOSIS — I4891 Unspecified atrial fibrillation: Secondary | ICD-10-CM | POA: Diagnosis not present

## 2016-02-26 DIAGNOSIS — I509 Heart failure, unspecified: Secondary | ICD-10-CM | POA: Diagnosis not present

## 2016-02-26 DIAGNOSIS — G4733 Obstructive sleep apnea (adult) (pediatric): Secondary | ICD-10-CM | POA: Diagnosis not present

## 2016-02-26 DIAGNOSIS — E1140 Type 2 diabetes mellitus with diabetic neuropathy, unspecified: Secondary | ICD-10-CM | POA: Diagnosis not present

## 2016-02-26 DIAGNOSIS — D649 Anemia, unspecified: Secondary | ICD-10-CM | POA: Diagnosis not present

## 2016-02-26 DIAGNOSIS — I872 Venous insufficiency (chronic) (peripheral): Secondary | ICD-10-CM | POA: Diagnosis not present

## 2016-02-26 DIAGNOSIS — M549 Dorsalgia, unspecified: Secondary | ICD-10-CM | POA: Diagnosis not present

## 2016-02-26 DIAGNOSIS — M1991 Primary osteoarthritis, unspecified site: Secondary | ICD-10-CM | POA: Diagnosis not present

## 2016-02-28 DIAGNOSIS — G4733 Obstructive sleep apnea (adult) (pediatric): Secondary | ICD-10-CM | POA: Diagnosis not present

## 2016-02-28 DIAGNOSIS — L03116 Cellulitis of left lower limb: Secondary | ICD-10-CM | POA: Diagnosis not present

## 2016-02-28 DIAGNOSIS — L03115 Cellulitis of right lower limb: Secondary | ICD-10-CM | POA: Diagnosis not present

## 2016-02-28 DIAGNOSIS — I872 Venous insufficiency (chronic) (peripheral): Secondary | ICD-10-CM | POA: Diagnosis not present

## 2016-02-28 DIAGNOSIS — E1140 Type 2 diabetes mellitus with diabetic neuropathy, unspecified: Secondary | ICD-10-CM | POA: Diagnosis not present

## 2016-02-28 DIAGNOSIS — M549 Dorsalgia, unspecified: Secondary | ICD-10-CM | POA: Diagnosis not present

## 2016-02-28 DIAGNOSIS — I509 Heart failure, unspecified: Secondary | ICD-10-CM | POA: Diagnosis not present

## 2016-02-28 DIAGNOSIS — M1991 Primary osteoarthritis, unspecified site: Secondary | ICD-10-CM | POA: Diagnosis not present

## 2016-02-28 DIAGNOSIS — I4891 Unspecified atrial fibrillation: Secondary | ICD-10-CM | POA: Diagnosis not present

## 2016-02-28 DIAGNOSIS — D649 Anemia, unspecified: Secondary | ICD-10-CM | POA: Diagnosis not present

## 2016-02-29 ENCOUNTER — Other Ambulatory Visit: Payer: Self-pay | Admitting: Internal Medicine

## 2016-03-03 DIAGNOSIS — M549 Dorsalgia, unspecified: Secondary | ICD-10-CM | POA: Diagnosis not present

## 2016-03-03 DIAGNOSIS — I872 Venous insufficiency (chronic) (peripheral): Secondary | ICD-10-CM | POA: Diagnosis not present

## 2016-03-03 DIAGNOSIS — L03116 Cellulitis of left lower limb: Secondary | ICD-10-CM | POA: Diagnosis not present

## 2016-03-03 DIAGNOSIS — G4733 Obstructive sleep apnea (adult) (pediatric): Secondary | ICD-10-CM | POA: Diagnosis not present

## 2016-03-03 DIAGNOSIS — L03115 Cellulitis of right lower limb: Secondary | ICD-10-CM | POA: Diagnosis not present

## 2016-03-03 DIAGNOSIS — M1991 Primary osteoarthritis, unspecified site: Secondary | ICD-10-CM | POA: Diagnosis not present

## 2016-03-03 DIAGNOSIS — D649 Anemia, unspecified: Secondary | ICD-10-CM | POA: Diagnosis not present

## 2016-03-03 DIAGNOSIS — I509 Heart failure, unspecified: Secondary | ICD-10-CM | POA: Diagnosis not present

## 2016-03-03 DIAGNOSIS — I4891 Unspecified atrial fibrillation: Secondary | ICD-10-CM | POA: Diagnosis not present

## 2016-03-03 DIAGNOSIS — E1140 Type 2 diabetes mellitus with diabetic neuropathy, unspecified: Secondary | ICD-10-CM | POA: Diagnosis not present

## 2016-03-05 DIAGNOSIS — I4891 Unspecified atrial fibrillation: Secondary | ICD-10-CM | POA: Diagnosis not present

## 2016-03-05 DIAGNOSIS — M549 Dorsalgia, unspecified: Secondary | ICD-10-CM | POA: Diagnosis not present

## 2016-03-05 DIAGNOSIS — E1140 Type 2 diabetes mellitus with diabetic neuropathy, unspecified: Secondary | ICD-10-CM | POA: Diagnosis not present

## 2016-03-05 DIAGNOSIS — L03116 Cellulitis of left lower limb: Secondary | ICD-10-CM | POA: Diagnosis not present

## 2016-03-05 DIAGNOSIS — L03115 Cellulitis of right lower limb: Secondary | ICD-10-CM | POA: Diagnosis not present

## 2016-03-05 DIAGNOSIS — D649 Anemia, unspecified: Secondary | ICD-10-CM | POA: Diagnosis not present

## 2016-03-05 DIAGNOSIS — M1991 Primary osteoarthritis, unspecified site: Secondary | ICD-10-CM | POA: Diagnosis not present

## 2016-03-05 DIAGNOSIS — I872 Venous insufficiency (chronic) (peripheral): Secondary | ICD-10-CM | POA: Diagnosis not present

## 2016-03-05 DIAGNOSIS — I509 Heart failure, unspecified: Secondary | ICD-10-CM | POA: Diagnosis not present

## 2016-03-05 DIAGNOSIS — G4733 Obstructive sleep apnea (adult) (pediatric): Secondary | ICD-10-CM | POA: Diagnosis not present

## 2016-03-06 ENCOUNTER — Telehealth: Payer: Self-pay

## 2016-03-06 MED ORDER — NALOXEGOL OXALATE 25 MG PO TABS: 25.0000 mg | ORAL_TABLET | Freq: Every day | ORAL | 3 refills | Status: DC

## 2016-03-06 NOTE — Telephone Encounter (Signed)
Patient called to report movantik is helping. Patient is requesting samples if available and a prescription.  2 weeks of samples placed at the front desk and rx sent to pharmacy on file.   Left message on voicemail informing patient

## 2016-03-10 ENCOUNTER — Telehealth: Payer: Self-pay

## 2016-03-10 DIAGNOSIS — E1140 Type 2 diabetes mellitus with diabetic neuropathy, unspecified: Secondary | ICD-10-CM | POA: Diagnosis not present

## 2016-03-10 DIAGNOSIS — I509 Heart failure, unspecified: Secondary | ICD-10-CM | POA: Diagnosis not present

## 2016-03-10 DIAGNOSIS — I872 Venous insufficiency (chronic) (peripheral): Secondary | ICD-10-CM | POA: Diagnosis not present

## 2016-03-10 DIAGNOSIS — M549 Dorsalgia, unspecified: Secondary | ICD-10-CM | POA: Diagnosis not present

## 2016-03-10 DIAGNOSIS — I4891 Unspecified atrial fibrillation: Secondary | ICD-10-CM | POA: Diagnosis not present

## 2016-03-10 DIAGNOSIS — D649 Anemia, unspecified: Secondary | ICD-10-CM | POA: Diagnosis not present

## 2016-03-10 DIAGNOSIS — M1991 Primary osteoarthritis, unspecified site: Secondary | ICD-10-CM | POA: Diagnosis not present

## 2016-03-10 DIAGNOSIS — L03115 Cellulitis of right lower limb: Secondary | ICD-10-CM | POA: Diagnosis not present

## 2016-03-10 DIAGNOSIS — L03116 Cellulitis of left lower limb: Secondary | ICD-10-CM | POA: Diagnosis not present

## 2016-03-10 DIAGNOSIS — G4733 Obstructive sleep apnea (adult) (pediatric): Secondary | ICD-10-CM | POA: Diagnosis not present

## 2016-03-10 NOTE — Telephone Encounter (Signed)
A fax was received from [facility] for a prior authorization for movantik 25 mg tablets. PA was initiated by calling [phone]. Patient ID # [id number].  PA was approved until 03/15/17. Approval number [id number].   Patient and pharmacy have been notified of approval.

## 2016-03-17 ENCOUNTER — Telehealth: Payer: Self-pay

## 2016-03-17 ENCOUNTER — Other Ambulatory Visit: Payer: Self-pay | Admitting: *Deleted

## 2016-03-17 DIAGNOSIS — M549 Dorsalgia, unspecified: Secondary | ICD-10-CM | POA: Diagnosis not present

## 2016-03-17 DIAGNOSIS — G4733 Obstructive sleep apnea (adult) (pediatric): Secondary | ICD-10-CM | POA: Diagnosis not present

## 2016-03-17 DIAGNOSIS — G5610 Other lesions of median nerve, unspecified upper limb: Secondary | ICD-10-CM

## 2016-03-17 DIAGNOSIS — I872 Venous insufficiency (chronic) (peripheral): Secondary | ICD-10-CM | POA: Diagnosis not present

## 2016-03-17 DIAGNOSIS — M1991 Primary osteoarthritis, unspecified site: Secondary | ICD-10-CM | POA: Diagnosis not present

## 2016-03-17 DIAGNOSIS — I509 Heart failure, unspecified: Secondary | ICD-10-CM | POA: Diagnosis not present

## 2016-03-17 DIAGNOSIS — E1140 Type 2 diabetes mellitus with diabetic neuropathy, unspecified: Secondary | ICD-10-CM | POA: Diagnosis not present

## 2016-03-17 DIAGNOSIS — D649 Anemia, unspecified: Secondary | ICD-10-CM | POA: Diagnosis not present

## 2016-03-17 DIAGNOSIS — I4891 Unspecified atrial fibrillation: Secondary | ICD-10-CM | POA: Diagnosis not present

## 2016-03-17 DIAGNOSIS — L03116 Cellulitis of left lower limb: Secondary | ICD-10-CM | POA: Diagnosis not present

## 2016-03-17 DIAGNOSIS — L03115 Cellulitis of right lower limb: Secondary | ICD-10-CM | POA: Diagnosis not present

## 2016-03-17 MED ORDER — METHADONE HCL 10 MG PO TABS: ORAL_TABLET | ORAL | 0 refills | Status: DC

## 2016-03-17 NOTE — Telephone Encounter (Signed)
I called patient to let him know that he has a prescription ready to be picked up at the office. Prescription is for methadone 10 mg tablets, #120.  Prescription was placed in filing cabinet at front desk.

## 2016-03-17 NOTE — Telephone Encounter (Signed)
Patient requested and will pick up 

## 2016-03-20 DIAGNOSIS — M1991 Primary osteoarthritis, unspecified site: Secondary | ICD-10-CM | POA: Diagnosis not present

## 2016-03-20 DIAGNOSIS — M549 Dorsalgia, unspecified: Secondary | ICD-10-CM | POA: Diagnosis not present

## 2016-03-20 DIAGNOSIS — I4891 Unspecified atrial fibrillation: Secondary | ICD-10-CM | POA: Diagnosis not present

## 2016-03-20 DIAGNOSIS — G4733 Obstructive sleep apnea (adult) (pediatric): Secondary | ICD-10-CM | POA: Diagnosis not present

## 2016-03-20 DIAGNOSIS — D649 Anemia, unspecified: Secondary | ICD-10-CM | POA: Diagnosis not present

## 2016-03-20 DIAGNOSIS — I509 Heart failure, unspecified: Secondary | ICD-10-CM | POA: Diagnosis not present

## 2016-03-20 DIAGNOSIS — I872 Venous insufficiency (chronic) (peripheral): Secondary | ICD-10-CM | POA: Diagnosis not present

## 2016-03-20 DIAGNOSIS — E1140 Type 2 diabetes mellitus with diabetic neuropathy, unspecified: Secondary | ICD-10-CM | POA: Diagnosis not present

## 2016-03-20 DIAGNOSIS — L03115 Cellulitis of right lower limb: Secondary | ICD-10-CM | POA: Diagnosis not present

## 2016-03-20 DIAGNOSIS — L03116 Cellulitis of left lower limb: Secondary | ICD-10-CM | POA: Diagnosis not present

## 2016-03-24 ENCOUNTER — Telehealth: Payer: Self-pay | Admitting: *Deleted

## 2016-03-24 DIAGNOSIS — M549 Dorsalgia, unspecified: Secondary | ICD-10-CM | POA: Diagnosis not present

## 2016-03-24 DIAGNOSIS — I509 Heart failure, unspecified: Secondary | ICD-10-CM | POA: Diagnosis not present

## 2016-03-24 DIAGNOSIS — E1140 Type 2 diabetes mellitus with diabetic neuropathy, unspecified: Secondary | ICD-10-CM | POA: Diagnosis not present

## 2016-03-24 DIAGNOSIS — L03116 Cellulitis of left lower limb: Secondary | ICD-10-CM | POA: Diagnosis not present

## 2016-03-24 DIAGNOSIS — D649 Anemia, unspecified: Secondary | ICD-10-CM | POA: Diagnosis not present

## 2016-03-24 DIAGNOSIS — M1991 Primary osteoarthritis, unspecified site: Secondary | ICD-10-CM | POA: Diagnosis not present

## 2016-03-24 DIAGNOSIS — G4733 Obstructive sleep apnea (adult) (pediatric): Secondary | ICD-10-CM | POA: Diagnosis not present

## 2016-03-24 DIAGNOSIS — I872 Venous insufficiency (chronic) (peripheral): Secondary | ICD-10-CM | POA: Diagnosis not present

## 2016-03-24 DIAGNOSIS — I4891 Unspecified atrial fibrillation: Secondary | ICD-10-CM | POA: Diagnosis not present

## 2016-03-24 DIAGNOSIS — L03115 Cellulitis of right lower limb: Secondary | ICD-10-CM | POA: Diagnosis not present

## 2016-03-24 NOTE — Telephone Encounter (Signed)
Rick Reasor with [facility] called and stated that he is with patient and he is complaining of No Bowel Movement in over 5 days, stabbing headache for a couple of weeks, Abdomen distended, Nausea and sweating and fatigue. Has tried OTC medications and prune juice with no relief.  Advised nurse to have patient go to Urgent Care or ER to be evaluated today. Nurse agreed. Patient does have an appointment scheduled here for Thursday.

## 2016-03-25 NOTE — Telephone Encounter (Signed)
Noted  

## 2016-03-26 ENCOUNTER — Encounter: Payer: Self-pay | Admitting: Nurse Practitioner

## 2016-03-26 ENCOUNTER — Ambulatory Visit (INDEPENDENT_AMBULATORY_CARE_PROVIDER_SITE_OTHER): Payer: Medicare Other | Admitting: Nurse Practitioner

## 2016-03-26 VITALS — BP 142/84 | HR 74 | Temp 97.8°F | Resp 18 | Ht 69.0 in | Wt 391.8 lb

## 2016-03-26 DIAGNOSIS — L03116 Cellulitis of left lower limb: Secondary | ICD-10-CM | POA: Diagnosis not present

## 2016-03-26 DIAGNOSIS — I4891 Unspecified atrial fibrillation: Secondary | ICD-10-CM | POA: Diagnosis not present

## 2016-03-26 DIAGNOSIS — M549 Dorsalgia, unspecified: Secondary | ICD-10-CM | POA: Diagnosis not present

## 2016-03-26 DIAGNOSIS — L03115 Cellulitis of right lower limb: Secondary | ICD-10-CM | POA: Diagnosis not present

## 2016-03-26 DIAGNOSIS — R519 Headache, unspecified: Secondary | ICD-10-CM

## 2016-03-26 DIAGNOSIS — I509 Heart failure, unspecified: Secondary | ICD-10-CM | POA: Diagnosis not present

## 2016-03-26 DIAGNOSIS — M25511 Pain in right shoulder: Secondary | ICD-10-CM | POA: Diagnosis not present

## 2016-03-26 DIAGNOSIS — M1991 Primary osteoarthritis, unspecified site: Secondary | ICD-10-CM | POA: Diagnosis not present

## 2016-03-26 DIAGNOSIS — G8929 Other chronic pain: Secondary | ICD-10-CM | POA: Diagnosis not present

## 2016-03-26 DIAGNOSIS — G4733 Obstructive sleep apnea (adult) (pediatric): Secondary | ICD-10-CM | POA: Diagnosis not present

## 2016-03-26 DIAGNOSIS — D649 Anemia, unspecified: Secondary | ICD-10-CM | POA: Diagnosis not present

## 2016-03-26 DIAGNOSIS — R51 Headache: Secondary | ICD-10-CM | POA: Diagnosis not present

## 2016-03-26 DIAGNOSIS — M25512 Pain in left shoulder: Secondary | ICD-10-CM | POA: Diagnosis not present

## 2016-03-26 DIAGNOSIS — E1140 Type 2 diabetes mellitus with diabetic neuropathy, unspecified: Secondary | ICD-10-CM | POA: Diagnosis not present

## 2016-03-26 DIAGNOSIS — I872 Venous insufficiency (chronic) (peripheral): Secondary | ICD-10-CM | POA: Diagnosis not present

## 2016-03-26 NOTE — Progress Notes (Signed)
Careteam: Patient Care Team: Estill Dooms, MD as PCP - General (Internal Medicine)  Advanced Directive information Does Patient Have a Medical Advance Directive?: No  Allergies  Allergen Reactions  . Adhesive [Tape] Other (See Comments)    Unknown paper tape ok to use   . Feldene [Piroxicam] Other (See Comments)    unknown  . Latex Other (See Comments)    Rash and itching    Chief Complaint  Patient presents with  . Acute Visit    Headache 3-4 months. Pain starts at top of head and moves down face/right eye. Describes pain as stinging/shooting. Pt has left shoulder pain and would like an xray.      HPI: Patient is a 66 y.o. male seen in the office today with complaints of a headache that began 3-4 months ago. He reports it starts at the top of his head and moves down to the right eye/side of his face. Occurs 3x/week. Describes the pain as a stinging/shooting. Feels like they hit upon standing.Last few times he has had these headaches they have been very severe. Feels like he looses orientation - forgetful, zones out. Makes the pain better to sit down. Bends over and cradles his head in his hands. Typically last for about an hour; he has to sit down and just relax. Rates the pain 9-10/10 when it is shooting. Hurts worse when he talks. Pressure in right eye. Eyes checked within the last year. Was told he has glaucoma and was prescribed drops of which he has not used in 4-5 months because he lost them. Has blurred vision that has worsened since the headaches began.     Additional complaint of left shoulder pain. Was seeing an orthopedic in [place], [place],  but does not wish to travel to [place], would like to see someone in town. Has been told he needs both shoulders replaced, and reports the doctor was not willing to do the surgery due to his size. Has received Cortisone injections in the past which were effective. Left shoulder feels like something has "gone wrong". Pain has  gotten progressively worse over the past 2 months. Rates the pain 10/10 in the left shoulder. Describes the pain as cold and numb on the inside and shooting when he moves the arm. States he yells out at times if he accidentally moves the arm. Does exercise and some light aerobics.   Review of Systems:  Review of Systems  Constitutional: Negative for activity change.  Eyes: Positive for pain and visual disturbance. Negative for redness.  Gastrointestinal: Negative for diarrhea and nausea.  Musculoskeletal: Positive for arthralgias, back pain and gait problem.       Left shoulder pain  Neurological: Positive for dizziness and headaches. Negative for syncope, facial asymmetry, speech difficulty, weakness, light-headedness and numbness.   Past Medical History:  Diagnosis Date  . Anemia   . Angina   . Anxiety   . Arthritis   . Atrial fibrillation with RVR ([place]) 01/24/2013  . Blood transfusion    last 10' 14- "GI bleed"  . CHF (congestive heart failure) ([place])   . CKD (chronic kidney disease) stage 3, GFR 30-59 ml/min 01/08/2016  . Depression   . Diabetes mellitus ([place]) 09/18/2011  . Diverticulosis   . DJD (degenerative joint disease)   . DM type 2, uncontrolled, with neuropathy ([place]) 12/26/2013  . Edema extremities    bilateral lower extremities-"weepy areas due to fluid retention"  . Fatty liver   . Fundic  gland polyps of stomach, benign   . Headache(784.0)   . Hepatitis B   . History of alcohol abuse    last drink in 1993  . Hypertension   . Neuromuscular disorder ([place])   . 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 Obesity   . Obesity, morbid ([place]) 07/30/2015  . Pneumonia    not at present time  . Renal insufficiency   . Shortness of breath   . Sleep apnea, obstructive    does where bipap. 06-04-14 "doesn't use much now, no mask/tubing now.   Past Surgical History:  Procedure Laterality Date  . ABDOMINAL SURGERY     [id number]  . BACK SURGERY     x 8  -,multiple fusions(cervical to lumbar)  . COLONOSCOPY    . COLONOSCOPY WITH PROPOFOL N/A 06/14/2014   Procedure: COLONOSCOPY WITH PROPOFOL;  Surgeon: Gatha Mayer, MD;  [facility];  Service: Endoscopy;  Laterality: N/A;  . DIAGNOSTIC LAPAROSCOPY    . ESOPHAGOGASTRODUODENOSCOPY N/A 01/03/2013   Procedure: ESOPHAGOGASTRODUODENOSCOPY (EGD);  Surgeon: Arta Silence, MD;  [facility];  Service: Endoscopy;  Laterality: N/A;  . ESOPHAGOGASTRODUODENOSCOPY N/A 01/03/2013   Procedure: ESOPHAGOGASTRODUODENOSCOPY (EGD);  Surgeon: Arta Silence, MD;  Location: [facility];  Service: Endoscopy;  Laterality: N/A;  . ESOPHAGOGASTRODUODENOSCOPY N/A 01/23/2013   Procedure: ESOPHAGOGASTRODUODENOSCOPY (EGD);  Surgeon: Lear Ng, MD;  [facility];  Service: Endoscopy;  Laterality: N/A;  . ESOPHAGOGASTRODUODENOSCOPY Left 01/27/2013   Procedure: ESOPHAGOGASTRODUODENOSCOPY (EGD);  Surgeon: Lear Ng, MD;  Location: [place];  Service: Endoscopy;  Laterality: Left;  . ESOPHAGOGASTRODUODENOSCOPY N/A 06/14/2014   Procedure: ESOPHAGOGASTRODUODENOSCOPY (EGD);  Surgeon: Gatha Mayer, MD;  Location: [facility];  Service: Endoscopy;  Laterality: N/A;  . ESOPHAGOSCOPY N/A 01/01/2013   Procedure: ESOPHAGOSCOPY;  Surgeon: Jeryl Columbia, MD;  Location: [place];  Service: Endoscopy;  Laterality: N/A;  . GASTRIC BYPASS  1977   Reversed in 1992 and revision 1994  . HYDROCELE EXCISION  1996   x 2   . KNEE SURGERY Left 2004   Social History:   reports that he has never smoked. He has never used smokeless tobacco. He reports that he does not drink alcohol or use drugs.  Family History  Problem Relation Age of Onset  . Heart disease Father   . Skin cancer Father   . COPD Father   . Hypertension Father   . Stroke Father   . Arthritis Mother   . Osteoporosis Mother   . Diabetes Daughter   . Depression Son   . Heart disease Maternal Uncle   . High blood pressure Sister   .  High blood pressure Sister   . High blood pressure Son   . Depression Son   . Arthritis Sister   . Arthritis Sister   . Cancer Maternal Grandmother   . Heart disease Maternal Uncle   . Heart disease Paternal Uncle     Medications: Patient's Medications  New Prescriptions   No medications on file  Previous Medications   ACETAMINOPHEN (TYLENOL) 650 MG CR TABLET    Take 1,950 mg by mouth 2 (two) times daily.   AMLODIPINE (NORVASC) 10 MG TABLET    Take 10 mg by mouth daily.   ATORVASTATIN (LIPITOR) 40 MG TABLET    take 1 tablet once daily   DULOXETINE (CYMBALTA) 60 MG CAPSULE    Take one tablet by mouth twice daily to help neuropathy   GLUCOSE BLOOD (ACCU-CHEK AVIVA PLUS) TEST  STRIP    1 each by Other route See admin instructions. Check blood sugar twice daily   INSULIN GLARGINE (LANTUS SOLOSTAR) 100 UNIT/ML SOLOSTAR PEN    Inject 24 Units into the skin daily.   LISINOPRIL (PRINIVIL,ZESTRIL) 20 MG TABLET    Take 1 tablet (20 mg total) by mouth daily.   LYRICA 150 MG CAPSULE    TAKE ONE CAPSULE THREE TIMES DAILY TO HELP WITH NERVE PAIN   METFORMIN (GLUCOPHAGE) 1000 MG TABLET    Take 1 tablet (1,000 mg total) by mouth 2 (two) times daily with a meal.   METHADONE (DOLOPHINE) 10 MG TABLET    Take One tablet by mouth 4 times daily  to control pain   METHYLNALTREXONE BROMIDE (RELISTOR) 150 MG TABS    Take 450 mg by mouth daily.   METOLAZONE (ZAROXOLYN) 2.5 MG TABLET    TAKE 1 TABLET(2.5 MG) BY MOUTH DAILY   METOPROLOL (LOPRESSOR) 50 MG TABLET    Take 1 tablet (50 mg total) by mouth 2 (two) times daily.   MULTIPLE VITAMIN (MULITIVITAMIN WITH MINERALS) TABS    Take 1 tablet by mouth daily.   NALOXEGOL OXALATE (MOVANTIK) 25 MG TABS TABLET    Take 1 tablet (25 mg total) by mouth daily.   NYSTATIN CREAM (MYCOSTATIN)    APPLY TO THE AFFECTED AREA(S) TWICE DAILY   PANTOPRAZOLE (PROTONIX) 40 MG TABLET    Take 1 tablet (40 mg total) by mouth 2 (two) times daily.   POLYETHYLENE GLYCOL (MIRALAX /  GLYCOLAX) PACKET    Take 17 g by mouth daily as needed for mild constipation or moderate constipation.   TORSEMIDE (DEMADEX) 100 MG TABLET    Take one tablet by mouth twice daily   TRAZODONE (DESYREL) 100 MG TABLET    Take 3 tablets (300 mg total) by mouth at bedtime.   TRIAMCINOLONE CREAM (KENALOG) 0.1 %    APPLY TO THE AFFECTED AREA(S) SPARINGLY TWICE DAILY UNTIL FOR RASH IS RESOLVED.  Modified Medications   No medications on file  Discontinued Medications   No medications on file     Physical Exam:  Vitals:   03/26/16 1343  BP: (!) 142/84  Pulse: 74  Resp: 18  Temp: 97.8 F (36.6 C)  TempSrc: Oral  SpO2: 95%  Weight: (!) 391 lb 12.8 oz (177.7 kg)  Height: 5\' 9"  (1.753 m)   Body mass index is 57.86 kg/m.  Physical Exam  Constitutional: He is oriented to person, place, and time. He appears well-developed and well-nourished. No distress.  HENT:  Head: Normocephalic and atraumatic.  Eyes: Conjunctivae and EOM are normal. Pupils are equal, round, and reactive to light. Right eye exhibits no nystagmus. Left eye exhibits no nystagmus.  Neck: Normal range of motion. Neck supple.  Cardiovascular: Normal rate, regular rhythm and normal heart sounds.   Pulmonary/Chest: Effort normal and breath sounds normal.  Abdominal: Soft. Bowel sounds are normal.  Musculoskeletal: He exhibits tenderness (left shoulder).       Left shoulder: He exhibits decreased range of motion, tenderness and pain. He exhibits no bony tenderness, no swelling, no crepitus, no deformity, no spasm and normal strength.  Limited ROM in the left shoulder. Pain with any movement of left shoulder.  Walks with a cane.  Neurological: He is alert and oriented to person, place, and time. He has normal strength. No cranial nerve deficit or sensory deficit. Coordination normal.  Skin: Skin is warm and dry. He is not diaphoretic.  Psychiatric: He has  a normal mood and affect. His behavior is normal. Judgment and thought  content normal.    Labs reviewed: Basic Metabolic Panel:  Recent Labs  12/10/15 1049 12/30/15 1555 01/28/16 1032  NA 139 136 140  K 4.4 4.5 4.1  CL 97* 90* 97*  CO2 29 28 33*  GLUCOSE 161* 219* 173*  BUN 23 36* 22  CREATININE 1.24 1.59* 1.16  CALCIUM 9.4 10.0 9.5   Liver Function Tests:  Recent Labs  07/30/15 0920 12/02/15 1603 12/10/15 1049  AST 45* 25 23  ALT 28 16 18   ALKPHOS 57 53 56  BILITOT 0.4 0.3 0.5  PROT 7.2 7.0 7.1  ALBUMIN 3.4* 3.7 3.8   No results for input(s): LIPASE, AMYLASE in the last 8760 hours. No results for input(s): AMMONIA in the last 8760 hours. CBC:  Recent Labs  07/30/15 0920 12/02/15 1603 01/28/16 1032  WBC 8.1 7.5 6.8  NEUTROABS 5.0 3,750 3,264  HGB 12.5* 12.4* 13.1*  HCT 40.4 37.8* 39.6  MCV 89.8 88.1 87.6  PLT 193 229 209   Lipid Panel: No results for input(s): CHOL, HDL, LDLCALC, TRIG, CHOLHDL, LDLDIRECT in the last 8760 hours. TSH: No results for input(s): TSH in the last 8760 hours. A1C: Lab Results  Component Value Date   HGBA1C 7.7 (H) 12/10/2015     Assessment/Plan 1. Headache - headache may be due untreated glaucoma, plan was to make appt with ophthalmologist prior to pt leaving office however he is unsure of who is eye doctor is. Pt reports it is in his phone at home. Reports blurred vision has been unchanged. Aware of risk on increased IOP and untreated glaucoma but reports he is "not that worried"   To schedule an appointment with opthalmologist ASAP to check intraocular pressure. Pls call us and let us know the name of ophthalmologist so we can add to our system.  Again educated pt on risk of untreated glaucoma.   2. Chronic pain of both shoulders - Ambulatory referral to Orthopedic Surgery   Keep follow-up with Dr. Nyoka Cowden on 1/17.  Carlos American. Harle Battiest  [facility] [phone] 8 am - 5 pm) [phone] (after hours)

## 2016-03-26 NOTE — Patient Instructions (Addendum)
Schedule an appointment with your opthalmologist ASAP so they can check your intraocular pressure. Please call us and let us know what the name of your ophthalmologist name is so we can add it to our system and when you are going to see them.   We are putting in a referral for an orthopedic.  Keep your follow-up with Dr. Nyoka Cowden later this month.

## 2016-03-27 ENCOUNTER — Telehealth: Payer: Self-pay

## 2016-03-27 NOTE — Telephone Encounter (Signed)
I spoke with patient to find out if he was able to find the name of the opthamologist that he reports he saw about 5 months ago. Patient stated that he was still looking through all of his paperwork and he would call the office with the information once he found it.   Patient needs to be reminded to call the opthalmologist and schedule an appointment to have his eyes examined due to increased headaches.

## 2016-03-30 NOTE — Telephone Encounter (Signed)
Left message for patient to call the office with the information about his opthalmologist.

## 2016-03-31 DIAGNOSIS — G4733 Obstructive sleep apnea (adult) (pediatric): Secondary | ICD-10-CM | POA: Diagnosis not present

## 2016-03-31 DIAGNOSIS — M1991 Primary osteoarthritis, unspecified site: Secondary | ICD-10-CM | POA: Diagnosis not present

## 2016-03-31 DIAGNOSIS — L03116 Cellulitis of left lower limb: Secondary | ICD-10-CM | POA: Diagnosis not present

## 2016-03-31 DIAGNOSIS — L03115 Cellulitis of right lower limb: Secondary | ICD-10-CM | POA: Diagnosis not present

## 2016-03-31 DIAGNOSIS — I509 Heart failure, unspecified: Secondary | ICD-10-CM | POA: Diagnosis not present

## 2016-03-31 DIAGNOSIS — I4891 Unspecified atrial fibrillation: Secondary | ICD-10-CM | POA: Diagnosis not present

## 2016-03-31 DIAGNOSIS — D649 Anemia, unspecified: Secondary | ICD-10-CM | POA: Diagnosis not present

## 2016-03-31 DIAGNOSIS — E1140 Type 2 diabetes mellitus with diabetic neuropathy, unspecified: Secondary | ICD-10-CM | POA: Diagnosis not present

## 2016-03-31 DIAGNOSIS — I872 Venous insufficiency (chronic) (peripheral): Secondary | ICD-10-CM | POA: Diagnosis not present

## 2016-03-31 DIAGNOSIS — M549 Dorsalgia, unspecified: Secondary | ICD-10-CM | POA: Diagnosis not present

## 2016-03-31 NOTE — Telephone Encounter (Signed)
Pt came by the office today to pick up prescriptions and while he was here I asked him about which opthalmologist he sees.   Pt stated that he goes to [facility] and sees Dr. Ronnald Ramp. Pt was advised that he needs to schedule an appointment to have his eyes examined and he stated that he would set up an appointment.   [facility] phone number is [phone].

## 2016-04-01 ENCOUNTER — Ambulatory Visit: Payer: Medicare Other | Admitting: Internal Medicine

## 2016-04-03 ENCOUNTER — Telehealth: Payer: Self-pay | Admitting: *Deleted

## 2016-04-03 DIAGNOSIS — Z9181 History of falling: Secondary | ICD-10-CM | POA: Diagnosis not present

## 2016-04-03 DIAGNOSIS — I4891 Unspecified atrial fibrillation: Secondary | ICD-10-CM | POA: Diagnosis not present

## 2016-04-03 DIAGNOSIS — L03116 Cellulitis of left lower limb: Secondary | ICD-10-CM | POA: Diagnosis not present

## 2016-04-03 DIAGNOSIS — G4733 Obstructive sleep apnea (adult) (pediatric): Secondary | ICD-10-CM | POA: Diagnosis not present

## 2016-04-03 DIAGNOSIS — D649 Anemia, unspecified: Secondary | ICD-10-CM | POA: Diagnosis not present

## 2016-04-03 DIAGNOSIS — I872 Venous insufficiency (chronic) (peripheral): Secondary | ICD-10-CM | POA: Diagnosis not present

## 2016-04-03 DIAGNOSIS — E1140 Type 2 diabetes mellitus with diabetic neuropathy, unspecified: Secondary | ICD-10-CM | POA: Diagnosis not present

## 2016-04-03 DIAGNOSIS — M1991 Primary osteoarthritis, unspecified site: Secondary | ICD-10-CM | POA: Diagnosis not present

## 2016-04-03 DIAGNOSIS — M549 Dorsalgia, unspecified: Secondary | ICD-10-CM | POA: Diagnosis not present

## 2016-04-03 DIAGNOSIS — L03115 Cellulitis of right lower limb: Secondary | ICD-10-CM | POA: Diagnosis not present

## 2016-04-03 DIAGNOSIS — I509 Heart failure, unspecified: Secondary | ICD-10-CM | POA: Diagnosis not present

## 2016-04-03 DIAGNOSIS — Z794 Long term (current) use of insulin: Secondary | ICD-10-CM | POA: Diagnosis not present

## 2016-04-03 NOTE — Telephone Encounter (Signed)
Tina with [facility] called with Concerns regarding patient. Stated she has been with patient 2-3 hours now. Psychologically  he has not been in good shape. Been picking his sores on his legs. Has swelling. She wrapped his legs. He is not sure of his blood sugars. He called their main office today with a panic attack kind of mode. Stated his whole body was tingling. He chose to take his insulin. Pressure are up, ran out of his medications, nurse stated she was going to go and pick them up for him. Non compliant. Has candy. Swollen from head to toe. Weight today was 388 and on Wednesday it was 397. Can they go from dry dressing wraps back to the [employer]. Patient has an appointment scheduled with you for 04/29/16. Please Advise.

## 2016-04-06 NOTE — Telephone Encounter (Signed)
It is OK to return to the [employer] dressings.

## 2016-04-06 NOTE — Telephone Encounter (Signed)
Phillip Maldonado with [facility] notified and agreed. Confirmed patient's appointment with her.

## 2016-04-07 ENCOUNTER — Ambulatory Visit (INDEPENDENT_AMBULATORY_CARE_PROVIDER_SITE_OTHER): Payer: Medicare Other | Admitting: Orthopaedic Surgery

## 2016-04-07 ENCOUNTER — Ambulatory Visit (INDEPENDENT_AMBULATORY_CARE_PROVIDER_SITE_OTHER): Payer: Medicare Other

## 2016-04-07 DIAGNOSIS — M25512 Pain in left shoulder: Secondary | ICD-10-CM | POA: Diagnosis not present

## 2016-04-07 DIAGNOSIS — G8929 Other chronic pain: Secondary | ICD-10-CM

## 2016-04-07 NOTE — Progress Notes (Addendum)
Office Visit Note   Patient: Phillip Maldonado           Date of Birth: 1950/08/08           MRN: [id number] Visit Date: 04/07/2016              Requested by: [name], NP [place], [place] [address] PCP: Jeanmarie Hubert, MD   Assessment & Plan: Visit Diagnoses:  1. Chronic left shoulder pain     Plan: Patient has left scapular trigger points. I recommend physical therapy for dry kneeling and soft tissue mobilization. Follow-up with me as needed.  Total face to face encounter time was greater than 45 minutes and over half of this time was spent in counseling and/or coordination of care.    Follow-Up Instructions: No Follow-up on file.   Orders:  Orders Placed This Encounter  Procedures  . XR Shoulder Left  . Ambulatory referral to Physical Therapy   No orders of the defined types were placed in this encounter.     Procedures: No procedures performed   Clinical Data: No additional findings.   Subjective: Chief Complaint  Patient presents with  . Left Shoulder - Pain    Patient is a morbidly obese 66 year old gentleman who comes in with left shoulder scapular trigger point pain for many years. This is gotten worse over the last few months. He has had cervical to thoracic fusion by neurosurgery. He endorses burning tingling and numbness. He does take methadone at baseline. He has a catching pain. He has not had any injections. He denies any injuries.    Review of Systems  Constitutional: Negative.   All other systems reviewed and are negative.  Complete review of systems negative except for history of present illness  Objective: Vital Signs: There were no vitals taken for this visit.  Physical Exam  Constitutional: He is oriented to person, place, and time. He appears well-developed and well-nourished.  HENT:  Head: Normocephalic and atraumatic.  Eyes: Pupils are equal, round, and reactive to light.  Neck: Neck supple.    Pulmonary/Chest: Effort normal.  Abdominal: Soft.  Musculoskeletal: Normal range of motion.  Neurological: He is alert and oriented to person, place, and time.  Skin: Skin is warm.  Psychiatric: He has a normal mood and affect. His behavior is normal. Judgment and thought content normal.  Nursing note and vitals reviewed.  Well-developed nourished acute distress alert 3 nonlabored breathing normal judgments affect abdomen soft no lymphadenopathy Ortho Exam Exam of the left scapula shows tenderness palpation in the rhomboid musculature. He has no focal motor or sensory deficits. Rotator cuff testing is normal. Specialty Comments:  No specialty comments available.  Imaging: Xr Shoulder Left  Result Date: 04/07/2016 No acute findings. Acromioclavicular joint arthropathy    PMFS History: Patient Active Problem List   Diagnosis Date Noted  . CKD (chronic kidney disease) stage 3, GFR 30-59 ml/min 01/08/2016  . History of recent fall 08/06/2015  . Falls frequently 08/06/2015  . Syncope 07/30/2015  . Obesity, morbid ([place]) 07/30/2015  . Pain in joint, ankle and foot 07/23/2015  . Encounter for long-term methadone use 04/26/2015  . Depression, major 04/24/2015  . Osteoarthritis of both knees 12/26/2014  . Neuropathy, median nerve 09/11/2014  . Allergic rhinitis 08/29/2014  . Polydipsia 08/29/2014  . Hereditary and idiopathic peripheral neuropathy 08/15/2014  . Pain in joint, shoulder region 08/15/2014  . Edema 06/20/2014  . Fundic gland polyps of stomach, benign   .  Colon cancer screening   . Anemia 05/16/2014  . Intertrigo 04/11/2014  . B12 deficiency anemia 04/11/2014  . Rash and nonspecific skin eruption 04/11/2014  . DM type 2, uncontrolled, with neuropathy ([place]) 12/26/2013  . PVD (peripheral vascular disease) ([place]) 12/26/2013  . Congestive heart disease ([place]) 12/26/2013  . Arthropathy of lumbar facet joint 09/26/2013  . Chronic pain syndrome 09/16/2013  . Atrial  fibrillation with RVR ([place]) 01/24/2013  . History of gastric bypass 01/02/2013  . Rotator cuff arthropathy 12/19/2012  . HTN (hypertension) 09/18/2011  . Hypercholesterolemia 09/18/2011  . OSA (obstructive sleep apnea) 09/18/2011  . Constipation 05/09/2011   Past Medical History:  Diagnosis Date  . Anemia   . Angina   . Anxiety   . Arthritis   . Atrial fibrillation with RVR ([place]) 01/24/2013  . Blood transfusion    last 10' 14- "GI bleed"  . CHF (congestive heart failure) ([place])   . CKD (chronic kidney disease) stage 3, GFR 30-59 ml/min 01/08/2016  . Depression   . Diabetes mellitus ([place]) 09/18/2011  . Diverticulosis   . DJD (degenerative joint disease)   . DM type 2, uncontrolled, with neuropathy ([place]) 12/26/2013  . Edema extremities    bilateral lower extremities-"weepy areas due to fluid retention"  . Fatty liver   . Fundic gland polyps of stomach, benign   . Headache(784.0)   . Hepatitis B   . History of alcohol abuse    last drink in 1993  . Hypertension   . Neuromuscular disorder ([place])   . 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 Obesity   . Obesity, morbid ([place]) 07/30/2015  . Pneumonia    not at present time  . Renal insufficiency   . Shortness of breath   . Sleep apnea, obstructive    does where bipap. 06-04-14 "doesn't use much now, no mask/tubing now.    Family History  Problem Relation Age of Onset  . Heart disease Father   . Skin cancer Father   . COPD Father   . Hypertension Father   . Stroke Father   . Arthritis Mother   . Osteoporosis Mother   . Diabetes Daughter   . Depression Son   . Heart disease Maternal Uncle   . High blood pressure Sister   . High blood pressure Sister   . High blood pressure Son   . Depression Son   . Arthritis Sister   . Arthritis Sister   . Cancer Maternal Grandmother   . Heart disease Maternal Uncle   . Heart disease Paternal Uncle     Past Surgical History:  Procedure Laterality Date  . ABDOMINAL  SURGERY     [phone]  . BACK SURGERY     x 8 -,multiple fusions(cervical to lumbar)  . COLONOSCOPY    . COLONOSCOPY WITH PROPOFOL N/A 06/14/2014   Procedure: COLONOSCOPY WITH PROPOFOL;  Surgeon: Gatha Mayer, MD;  [facility];  Service: Endoscopy;  Laterality: N/A;  . DIAGNOSTIC LAPAROSCOPY    . ESOPHAGOGASTRODUODENOSCOPY N/A 01/03/2013   Procedure: ESOPHAGOGASTRODUODENOSCOPY (EGD);  Surgeon: Arta Silence, MD;  [facility];  Service: Endoscopy;  Laterality: N/A;  . ESOPHAGOGASTRODUODENOSCOPY N/A 01/03/2013   Procedure: ESOPHAGOGASTRODUODENOSCOPY (EGD);  Surgeon: Arta Silence, MD;  Location: [facility];  Service: Endoscopy;  Laterality: N/A;  . ESOPHAGOGASTRODUODENOSCOPY N/A 01/23/2013   Procedure: ESOPHAGOGASTRODUODENOSCOPY (EGD);  Surgeon: Lear Ng, MD;  [facility];  Service: Endoscopy;  Laterality: N/A;  . ESOPHAGOGASTRODUODENOSCOPY Left 01/27/2013  Procedure: ESOPHAGOGASTRODUODENOSCOPY (EGD);  Surgeon: Lear Ng, MD;  Location: [place];  Service: Endoscopy;  Laterality: Left;  . ESOPHAGOGASTRODUODENOSCOPY N/A 06/14/2014   Procedure: ESOPHAGOGASTRODUODENOSCOPY (EGD);  Surgeon: Gatha Mayer, MD;  Location: [facility];  Service: Endoscopy;  Laterality: N/A;  . ESOPHAGOSCOPY N/A 01/01/2013   Procedure: ESOPHAGOSCOPY;  Surgeon: Jeryl Columbia, MD;  Location: [place];  Service: Endoscopy;  Laterality: N/A;  . GASTRIC BYPASS  1977   Reversed in 1992 and revision 1994  . HYDROCELE EXCISION  1996   x 2   . KNEE SURGERY Left 2004   Social History   Occupational History  . DISABLED    Social History Main Topics  . Smoking status: Never Smoker  . Smokeless tobacco: Never Used  . Alcohol use No  . Drug use: No  . Sexual activity: Not Currently

## 2016-04-08 NOTE — Addendum Note (Signed)
Addended by: Azucena Cecil on: 04/08/2016 08:26 PM   Modules accepted: Level of Service

## 2016-04-20 ENCOUNTER — Other Ambulatory Visit: Payer: Self-pay | Admitting: *Deleted

## 2016-04-20 ENCOUNTER — Other Ambulatory Visit: Payer: Self-pay | Admitting: Internal Medicine

## 2016-04-20 DIAGNOSIS — G4733 Obstructive sleep apnea (adult) (pediatric): Secondary | ICD-10-CM | POA: Diagnosis not present

## 2016-04-20 DIAGNOSIS — L03115 Cellulitis of right lower limb: Secondary | ICD-10-CM | POA: Diagnosis not present

## 2016-04-20 DIAGNOSIS — M1991 Primary osteoarthritis, unspecified site: Secondary | ICD-10-CM | POA: Diagnosis not present

## 2016-04-20 DIAGNOSIS — L03116 Cellulitis of left lower limb: Secondary | ICD-10-CM | POA: Diagnosis not present

## 2016-04-20 DIAGNOSIS — I4891 Unspecified atrial fibrillation: Secondary | ICD-10-CM | POA: Diagnosis not present

## 2016-04-20 DIAGNOSIS — E1140 Type 2 diabetes mellitus with diabetic neuropathy, unspecified: Secondary | ICD-10-CM | POA: Diagnosis not present

## 2016-04-20 DIAGNOSIS — M549 Dorsalgia, unspecified: Secondary | ICD-10-CM | POA: Diagnosis not present

## 2016-04-20 DIAGNOSIS — D649 Anemia, unspecified: Secondary | ICD-10-CM | POA: Diagnosis not present

## 2016-04-20 DIAGNOSIS — I509 Heart failure, unspecified: Secondary | ICD-10-CM | POA: Diagnosis not present

## 2016-04-20 DIAGNOSIS — G5610 Other lesions of median nerve, unspecified upper limb: Secondary | ICD-10-CM

## 2016-04-20 DIAGNOSIS — I872 Venous insufficiency (chronic) (peripheral): Secondary | ICD-10-CM | POA: Diagnosis not present

## 2016-04-20 DIAGNOSIS — F3289 Other specified depressive episodes: Secondary | ICD-10-CM

## 2016-04-20 MED ORDER — METHADONE HCL 10 MG PO TABS: ORAL_TABLET | ORAL | 0 refills | Status: DC

## 2016-04-20 NOTE — Telephone Encounter (Signed)
Patient requested and will pick up 

## 2016-04-21 ENCOUNTER — Ambulatory Visit: Payer: Medicare Other | Attending: Orthopaedic Surgery

## 2016-04-21 ENCOUNTER — Ambulatory Visit: Payer: Medicare Other | Admitting: Physical Therapy

## 2016-04-29 ENCOUNTER — Encounter: Payer: Self-pay | Admitting: Internal Medicine

## 2016-04-29 ENCOUNTER — Ambulatory Visit: Payer: Medicare Other | Admitting: Internal Medicine

## 2016-05-14 ENCOUNTER — Telehealth: Payer: Self-pay

## 2016-05-14 NOTE — Telephone Encounter (Signed)
Message left on clinical intake voicemail:   Patient left message c/o lower leg swelling and pain. Patient is watching his diet and staying off feet/legs as much as possible. Patient contacted [facility] to see about getting legs wrapped and was told to start with seeing his pcp first.  Patient mentioned that leg wraps did not work before yet he does not know what to do about this reoccurring swelling.   No available appointments today or tomorrow  Spoke with patient and informed him no available appointments, patient aware message will be sent to Jeanmarie Hubert, MD for advice/recommendation   Please advise

## 2016-05-14 NOTE — Telephone Encounter (Signed)
Spoke with patient, patient agreed that he needs to be seen and scheduled an appointment for Monday with Dani Gobble. Patient refused Urgent Care or Emergency Room states " Im not trying to spend a fortune or wait forever in the Emergency Room."  I stressed to patient that is symptoms progress, warmth to the touch, pus-like drainage or fever develope he needs to seek medical attention at Urgent Care or Emergency Room.  I double checked the schedules to make sure no cancellations for today or tomorrow.

## 2016-05-14 NOTE — Telephone Encounter (Signed)
Unfortunately, he has missed a couple of appts. I really do not know what shape his legs are in. He has a chronic edema and relapsing cellulitis of the legs. He should be seen to assess the condition of his legs. We can call in an antibiotic, but this is not the safest course of action.

## 2016-05-14 NOTE — Telephone Encounter (Signed)
Thank you :)

## 2016-05-18 ENCOUNTER — Telehealth: Payer: Self-pay

## 2016-05-18 ENCOUNTER — Encounter: Payer: Self-pay | Admitting: Internal Medicine

## 2016-05-18 ENCOUNTER — Ambulatory Visit: Payer: Self-pay | Admitting: Nurse Practitioner

## 2016-05-18 DIAGNOSIS — Z0289 Encounter for other administrative examinations: Secondary | ICD-10-CM

## 2016-05-18 NOTE — Telephone Encounter (Signed)
Spoke with patient to see why he missed appointment today at 10:45 am, patient states he forgot about appointment.  I offered to scheduled patient for this afternoon at 3:15 pm in an open slot and patient states " Honestly I don't think I would be able to make it, I just don't feel good." Patient indicates that his legs have improved and he will call later this week if symptoms persist or progress.

## 2016-05-21 ENCOUNTER — Other Ambulatory Visit: Payer: Self-pay | Admitting: *Deleted

## 2016-05-21 DIAGNOSIS — G5610 Other lesions of median nerve, unspecified upper limb: Secondary | ICD-10-CM

## 2016-05-21 MED ORDER — METHADONE HCL 10 MG PO TABS: ORAL_TABLET | ORAL | 0 refills | Status: DC

## 2016-05-21 NOTE — Telephone Encounter (Signed)
Patient requested and will make a follow up appointment when he comes and picks up his rx.

## 2016-06-02 ENCOUNTER — Other Ambulatory Visit: Payer: Self-pay | Admitting: Internal Medicine

## 2016-06-02 ENCOUNTER — Other Ambulatory Visit: Payer: Self-pay

## 2016-06-02 DIAGNOSIS — F3289 Other specified depressive episodes: Secondary | ICD-10-CM

## 2016-06-02 MED ORDER — PREGABALIN 150 MG PO CAPS: ORAL_CAPSULE | ORAL | 0 refills | Status: DC

## 2016-06-02 NOTE — Telephone Encounter (Signed)
A fax was received from [facility] requesting a refill for lyrica 150 mg. Rx was called into pharmacy for #90 no refills.

## 2016-06-10 ENCOUNTER — Encounter: Payer: Self-pay | Admitting: Internal Medicine

## 2016-06-10 ENCOUNTER — Ambulatory Visit (INDEPENDENT_AMBULATORY_CARE_PROVIDER_SITE_OTHER): Payer: Medicare Other | Admitting: Internal Medicine

## 2016-06-10 VITALS — BP 116/60 | HR 60 | Temp 99.1°F | Ht 69.0 in | Wt 392.0 lb

## 2016-06-10 DIAGNOSIS — I4891 Unspecified atrial fibrillation: Secondary | ICD-10-CM

## 2016-06-10 DIAGNOSIS — R300 Dysuria: Secondary | ICD-10-CM

## 2016-06-10 DIAGNOSIS — R609 Edema, unspecified: Secondary | ICD-10-CM

## 2016-06-10 DIAGNOSIS — K5900 Constipation, unspecified: Secondary | ICD-10-CM | POA: Diagnosis not present

## 2016-06-10 DIAGNOSIS — G894 Chronic pain syndrome: Secondary | ICD-10-CM

## 2016-06-10 DIAGNOSIS — D519 Vitamin B12 deficiency anemia, unspecified: Secondary | ICD-10-CM | POA: Diagnosis not present

## 2016-06-10 DIAGNOSIS — E1140 Type 2 diabetes mellitus with diabetic neuropathy, unspecified: Secondary | ICD-10-CM

## 2016-06-10 DIAGNOSIS — R21 Rash and other nonspecific skin eruption: Secondary | ICD-10-CM | POA: Diagnosis not present

## 2016-06-10 DIAGNOSIS — E1165 Type 2 diabetes mellitus with hyperglycemia: Secondary | ICD-10-CM | POA: Diagnosis not present

## 2016-06-10 DIAGNOSIS — IMO0002 Reserved for concepts with insufficient information to code with codable children: Secondary | ICD-10-CM

## 2016-06-10 DIAGNOSIS — I10 Essential (primary) hypertension: Secondary | ICD-10-CM

## 2016-06-10 DIAGNOSIS — R5381 Other malaise: Secondary | ICD-10-CM | POA: Diagnosis not present

## 2016-06-10 LAB — CBC WITH DIFFERENTIAL/PLATELET
Basophils Absolute: 90 cells/uL (ref 0–200)
Basophils Relative: 1 %
Eosinophils Absolute: 450 cells/uL (ref 15–500)
Eosinophils Relative: 5 %
HEMATOCRIT: 38.7 % (ref 38.5–50.0)
HEMOGLOBIN: 12.7 g/dL — AB (ref 13.2–17.1)
LYMPHS ABS: 3060 {cells}/uL (ref 850–3900)
Lymphocytes Relative: 34 %
MCH: 29.1 pg (ref 27.0–33.0)
MCHC: 32.8 g/dL (ref 32.0–36.0)
MCV: 88.8 fL (ref 80.0–100.0)
MONO ABS: 630 {cells}/uL (ref 200–950)
MPV: 10.1 fL (ref 7.5–12.5)
Monocytes Relative: 7 %
NEUTROS ABS: 4770 {cells}/uL (ref 1500–7800)
Neutrophils Relative %: 53 %
Platelets: 258 10*3/uL (ref 140–400)
RBC: 4.36 MIL/uL (ref 4.20–5.80)
RDW: 14.6 % (ref 11.0–15.0)
WBC: 9 10*3/uL (ref 3.8–10.8)

## 2016-06-10 MED ORDER — CIPROFLOXACIN HCL 500 MG PO TABS: ORAL_TABLET | ORAL | 0 refills | Status: DC

## 2016-06-10 MED ORDER — CIPROFLOXACIN HCL 500 MG PO TABS: 500.0000 mg | ORAL_TABLET | Freq: Two times a day (BID) | ORAL | 0 refills | Status: DC

## 2016-06-10 NOTE — Patient Instructions (Addendum)
Stop amlodipine.  Increase Movantik to twice daily until constipation is relieved.

## 2016-06-10 NOTE — Progress Notes (Signed)
Facility  [facility]    Place of Service:   OFFICE    Allergies  Allergen Reactions  . Adhesive [Tape] Other (See Comments)    Unknown paper tape ok to use   . Feldene [Piroxicam] Other (See Comments)    unknown  . Latex Other (See Comments)    Rash and itching    Chief Complaint  Patient presents with  . Medical Management of Chronic Issues    "Doesn't feel like himself"! Has swelling all over, feels out of breath. Swelling in legs, has sores on legs.     HPI:   DM type 2, uncontrolled, with neuropathy ([place]) - not following diet  Malaise - not feeling well. Diaphoretic. Dyspneic. Increased swelling.  Dysuria - hurts to urinate.  Constipation, unspecified constipation type - Movantik had helped, but more constipated the last few days.  Edema, unspecified type - increased in legs and hands.  Atrial fibrillation with RVR (HCC) - thready pulse  Essential hypertension - controlled  Obesity, morbid ([place]) - unchanged  Anemia due to vitamin B12 deficiency, unspecified B12 deficiency type - feeling weak  Chronic pain syndrome - methadone helps  Rash and nonspecific skin eruption - multiple open lesions of both lower legs. Puple discoloration and chronic scarring of the right medial calf area.    Medications: Patient's Medications  New Prescriptions   No medications on file  Previous Medications   ACETAMINOPHEN (TYLENOL) 650 MG CR TABLET    Take 1,950 mg by mouth 2 (two) times daily.   AMLODIPINE (NORVASC) 10 MG TABLET    Take 10 mg by mouth daily.   ATORVASTATIN (LIPITOR) 40 MG TABLET    take 1 tablet once daily   DULOXETINE (CYMBALTA) 60 MG CAPSULE    Take one CAPSULE by mouth twice daily to help neuropathy   GLUCOSE BLOOD (ACCU-CHEK AVIVA PLUS) TEST STRIP    1 each by Other route See admin instructions. Check blood sugar twice daily   INSULIN GLARGINE (LANTUS SOLOSTAR) 100 UNIT/ML SOLOSTAR PEN    Inject 24 Units into the skin daily.   LISINOPRIL (PRINIVIL,ZESTRIL)  20 MG TABLET    Take 1 tablet (20 mg total) by mouth daily.   METFORMIN (GLUCOPHAGE) 1000 MG TABLET    Take 1 tablet (1,000 mg total) by mouth 2 (two) times daily with a meal.   METHADONE (DOLOPHINE) 10 MG TABLET    Take One tablet by mouth 4 times daily  to control pain   METHYLNALTREXONE BROMIDE (RELISTOR) 150 MG TABS    Take 450 mg by mouth daily.   METOLAZONE (ZAROXOLYN) 2.5 MG TABLET    TAKE 1 TABLET(2.5 MG) BY MOUTH DAILY   METOPROLOL (LOPRESSOR) 50 MG TABLET    Take 1 tablet (50 mg total) by mouth 2 (two) times daily.   MULTIPLE VITAMIN (MULITIVITAMIN WITH MINERALS) TABS    Take 1 tablet by mouth daily.   NALOXEGOL OXALATE (MOVANTIK) 25 MG TABS TABLET    Take 1 tablet (25 mg total) by mouth daily.   NYSTATIN CREAM (MYCOSTATIN)    APPLY TO THE AFFECTED AREA(S) TWICE DAILY   PANTOPRAZOLE (PROTONIX) 40 MG TABLET    Take 1 tablet (40 mg total) by mouth 2 (two) times daily.   POLYETHYLENE GLYCOL (MIRALAX / GLYCOLAX) PACKET    Take 17 g by mouth daily as needed for mild constipation or moderate constipation.   PREGABALIN (LYRICA) 150 MG CAPSULE    TAKE ONE CAPSULE THREE TIMES DAILY TO HELP  WITH NERVE PAIN   TORSEMIDE (DEMADEX) 100 MG TABLET    Take one tablet by mouth twice daily   TRAZODONE (DESYREL) 100 MG TABLET    Take 3 tablets (300 mg total) by mouth at bedtime.   TRIAMCINOLONE CREAM (KENALOG) 0.1 %    APPLY TO THE AFFECTED AREA(S) SPARINGLY TWICE DAILY UNTIL FOR RASH IS RESOLVED.  Modified Medications   No medications on file  Discontinued Medications   No medications on file    Review of Systems  Constitutional: Positive for activity change and fatigue. Negative for appetite change, fever and unexpected weight change.       Morbidly obese  HENT: Negative for congestion, ear pain, hearing loss, rhinorrhea, sore throat, tinnitus, trouble swallowing and voice change.   Eyes:       Corrective lenses  Respiratory: Positive for shortness of breath. Negative for cough, choking, chest  tightness and wheezing.   Cardiovascular: Positive for chest pain and leg swelling. Negative for palpitations.  Gastrointestinal: Negative for abdominal distention, abdominal pain, constipation, diarrhea and nausea.  Endocrine: Negative for cold intolerance, heat intolerance, polydipsia, polyphagia and polyuria.  Genitourinary: Negative for dysuria, frequency, testicular pain and urgency.       Not incontinent  Musculoskeletal: Positive for back pain. Negative for arthralgias, gait problem, myalgias and neck pain.       Tender left ankle.  Skin: Positive for rash.       erythema of the anterior shins. History of intertrigo under the breasts and in the groin area bilaterally.  Blotchy, red facial rash present since Spring 2015. Chronic venous stasis changes of both lower legs. Abrasion of the right knee laterally.  Allergic/Immunologic: Negative.   Neurological: Positive for light-headedness and numbness (both hands demonstrate findiings compatible with carpal tunnel syndrome). Negative for dizziness, tremors, syncope, speech difficulty, weakness and headaches.       Wears glove on the right hand due to chronic painful median nerve neuropathy.History of painful neuropathy of the feet and lower legs.  Hematological: Negative for adenopathy. Does not bruise/bleed easily.  Psychiatric/Behavioral: Negative for behavioral problems, confusion, decreased concentration, hallucinations and sleep disturbance. The patient is not nervous/anxious.        Mild memory difficulty    Vitals:   06/10/16 1446  BP: 116/60  Pulse: 60  Temp: 99.1 F (37.3 C)  TempSrc: Oral  SpO2: 94%  Weight: (!) 392 lb (177.8 kg)  Height: '5\' 9"'  (1.753 m)   Body mass index is 57.89 kg/m. Wt Readings from Last 3 Encounters:  06/10/16 (!) 392 lb (177.8 kg)  03/26/16 (!) 391 lb 12.8 oz (177.7 kg)  01/28/16 (!) 397 lb 6.4 oz (180.3 kg)      Physical Exam  Constitutional: He is oriented to person, place, and time. He  appears well-developed and well-nourished. No distress.  Morbid obesity  HENT:  Right Ear: External ear normal.  Left Ear: External ear normal.  Nose: Nose normal.  Mouth/Throat: Oropharynx is clear and moist. No oropharyngeal exudate.  Eyes: Conjunctivae and EOM are normal. Pupils are equal, round, and reactive to light.  Neck: No JVD present. No tracheal deviation present. No thyromegaly present.  Cardiovascular: Normal rate, regular rhythm, normal heart sounds and intact distal pulses.  Exam reveals no gallop and no friction rub.   No murmur heard. Pulmonary/Chest: No respiratory distress. He has no wheezes. He has no rales. He exhibits no tenderness.  Abdominal: He exhibits no distension and no mass. There is no tenderness.  Musculoskeletal: He exhibits edema. He exhibits no tenderness.  Using walker. Unstable gait. Tendeer left ankle.  Lymphadenopathy:    He has no cervical adenopathy.  Neurological: He is alert and oriented to person, place, and time. He has normal reflexes. No cranial nerve deficit. Coordination normal.  08/15/14 MMSE 27/30 09/06/2014  PNCV showed severe bilateral median nerve neuropathy consistent with severe carpal tunnel syndrome.  Skin: There is erythema (both shins). No pallor.  Chronic venous stasis changes of both anterior legs. Abrasion right knee laterally. Multiple open sores of both lower legs. Purple discoloration and scarring of the right medial calf area.  Psychiatric: He has a normal mood and affect. His behavior is normal. Judgment and thought content normal.    Labs reviewed: Lab Summary Latest Ref Rng & Units 01/28/2016 12/30/2015  Hemoglobin 13.2 - 17.1 g/dL 13.1(L) (None)  Hematocrit 38.5 - 50.0 % 39.6 (None)  White count 3.8 - 10.8 K/uL 6.8 (None)  Platelet count 140 - 400 K/uL 209 (None)  Sodium 135 - 146 mmol/L 140 136  Potassium 3.5 - 5.3 mmol/L 4.1 4.5  Calcium 8.6 - 10.3 mg/dL 9.5 10.0  Phosphorus - (None) (None)  Creatinine 0.70 -  1.25 mg/dL 1.16 1.59(H)  AST - (None) (None)  Alk Phos - (None) (None)  Bilirubin - (None) (None)  Glucose 65 - 99 mg/dL 173(H) 219(H)  Cholesterol - (None) (None)  HDL cholesterol - (None) (None)  Triglycerides - (None) (None)  LDL Direct - (None) (None)  LDL Calc - (None) (None)  Total protein - (None) (None)  Albumin - (None) (None)  Some recent data might be hidden   Lab Results  Component Value Date   TSH 2.530 03/14/2014   TSH 1.470 12/26/2013   TSH 4.000 09/16/2013   Lab Results  Component Value Date   BUN 22 01/28/2016   BUN 36 (H) 12/30/2015   BUN 23 12/10/2015   Lab Results  Component Value Date   HGBA1C 7.7 (H) 12/10/2015   HGBA1C 8.4 (H) 02/12/2015   HGBA1C 8.5 (H) 10/11/2014   06/10/16 EKG: rate 71. NSR. RBBB. RVH with repolarization abnormality.Marland Kitchen LAD  Assessment/Plan  1. Malaise - Comprehensive metabolic panel - TSH  2. Dysuria - Urinalysis - Urine culture  3. DM type 2, uncontrolled, with neuropathy ([place]) - Hemoglobin A1c  4. Constipation, unspecified constipation type Increase Movantik to twice daily until constipation is relieved  5. Edema, unspecified type Continue current medications  6. Atrial fibrillation with RVR (HCC) - EKG 12-Lead; Future  7. Essential hypertension - Comprehensive metabolic panel  8. Obesity, morbid ([place]) Encouraged diet compliance and weight loss  9. Anemia due to vitamin B12 deficiency, unspecified B12 deficiency type - CBC with Differential/Platelet  10. Chronic pain syndrome Continue methadone  11. Rash and nonspecific skin eruption - ciprofloxacin (CIPRO) 500 MG tablet; Take 1 tablet (500 mg total) by mouth 2 (two) times daily.  Dispense: 6 tablet; Refill: 0

## 2016-06-11 LAB — COMPREHENSIVE METABOLIC PANEL
ALBUMIN: 3.7 g/dL (ref 3.6–5.1)
ALK PHOS: 45 U/L (ref 40–115)
ALT: 18 U/L (ref 9–46)
AST: 30 U/L (ref 10–35)
BILIRUBIN TOTAL: 0.4 mg/dL (ref 0.2–1.2)
BUN: 16 mg/dL (ref 7–25)
CALCIUM: 9.2 mg/dL (ref 8.6–10.3)
CO2: 32 mmol/L — ABNORMAL HIGH (ref 20–31)
CREATININE: 1.25 mg/dL (ref 0.70–1.25)
Chloride: 99 mmol/L (ref 98–110)
GLUCOSE: 116 mg/dL — AB (ref 65–99)
Potassium: 4.2 mmol/L (ref 3.5–5.3)
Sodium: 144 mmol/L (ref 135–146)
Total Protein: 7.2 g/dL (ref 6.1–8.1)

## 2016-06-11 LAB — HEMOGLOBIN A1C
HEMOGLOBIN A1C: 6.9 % — AB (ref <5.7)
MEAN PLASMA GLUCOSE: 151 mg/dL

## 2016-06-11 LAB — TSH: TSH: 2.66 mIU/L (ref 0.40–4.50)

## 2016-06-11 LAB — URINE CULTURE

## 2016-06-24 ENCOUNTER — Ambulatory Visit (INDEPENDENT_AMBULATORY_CARE_PROVIDER_SITE_OTHER): Payer: Medicare Other | Admitting: Internal Medicine

## 2016-06-24 ENCOUNTER — Encounter: Payer: Self-pay | Admitting: Internal Medicine

## 2016-06-24 VITALS — BP 110/64 | HR 69 | Temp 98.4°F | Ht 69.0 in | Wt 395.0 lb

## 2016-06-24 DIAGNOSIS — I4891 Unspecified atrial fibrillation: Secondary | ICD-10-CM | POA: Diagnosis not present

## 2016-06-24 DIAGNOSIS — E1165 Type 2 diabetes mellitus with hyperglycemia: Secondary | ICD-10-CM

## 2016-06-24 DIAGNOSIS — R21 Rash and other nonspecific skin eruption: Secondary | ICD-10-CM | POA: Diagnosis not present

## 2016-06-24 DIAGNOSIS — G5610 Other lesions of median nerve, unspecified upper limb: Secondary | ICD-10-CM

## 2016-06-24 DIAGNOSIS — IMO0002 Reserved for concepts with insufficient information to code with codable children: Secondary | ICD-10-CM

## 2016-06-24 DIAGNOSIS — I10 Essential (primary) hypertension: Secondary | ICD-10-CM

## 2016-06-24 DIAGNOSIS — L304 Erythema intertrigo: Secondary | ICD-10-CM

## 2016-06-24 DIAGNOSIS — G609 Hereditary and idiopathic neuropathy, unspecified: Secondary | ICD-10-CM

## 2016-06-24 DIAGNOSIS — K5900 Constipation, unspecified: Secondary | ICD-10-CM

## 2016-06-24 DIAGNOSIS — N183 Chronic kidney disease, stage 3 unspecified: Secondary | ICD-10-CM

## 2016-06-24 DIAGNOSIS — R609 Edema, unspecified: Secondary | ICD-10-CM

## 2016-06-24 DIAGNOSIS — E1140 Type 2 diabetes mellitus with diabetic neuropathy, unspecified: Secondary | ICD-10-CM | POA: Diagnosis not present

## 2016-06-24 MED ORDER — TRIAMCINOLONE ACETONIDE 0.1 % EX CREA: TOPICAL_CREAM | CUTANEOUS | 2 refills | Status: DC

## 2016-06-24 MED ORDER — METHADONE HCL 10 MG PO TABS: ORAL_TABLET | ORAL | 0 refills | Status: DC

## 2016-06-24 MED ORDER — NYSTATIN 100000 UNIT/GM EX CREA: TOPICAL_CREAM | CUTANEOUS | 2 refills | Status: DC

## 2016-06-24 MED ORDER — METOLAZONE 2.5 MG PO TABS: ORAL_TABLET | ORAL | 5 refills | Status: DC

## 2016-06-24 NOTE — Progress Notes (Signed)
Facility  [facility]    Place of Service:   OFFICE    Allergies  Allergen Reactions  . Adhesive [Tape] Other (See Comments)    Unknown paper tape ok to use   . Feldene [Piroxicam] Other (See Comments)    unknown  . Latex Other (See Comments)    Rash and itching    Chief Complaint  Patient presents with  . Medical Management of Chronic Issues    2 week follow up malaise, edema. "Can't get swelling in feet down".    HPI:   Last visit was 06/10/16. Was put on Cipro for rash o legs. Also started Movantik for constipation. Advised to continue diuretics.  Sleeps in recliner. Gained another 3#.  Edema, unspecified type- continued edema with associated rash and weeping of the skin on the legs. Has not been usin metolazone.   Rash and nonspecific skin eruption - improved since treatment with Cipro.  Obesity, morbid ([place]) - gaining weight.  Essential hypertension - controlled  CKD (chronic kidney disease) stage 3, GFR 30-59 ml/min - stable   Neuropathy, median nerve, unspecified laterality - severe pains in the legs and feet. Lyrica helps, but it is probably making the edema worse and resistant to diuretics.  Constipation, unspecified constipation type - improved with Movantik  Atrial fibrillation with RVR ([place]) - I think he is in NSR today  DM type 2, uncontrolled, with neuropathy ([place]) - controlled  Hereditary and idiopathic peripheral neuropathy - diabetic neuropathy is very painful. Methadone has been helpful. He occasionally uses 2 at a time for pain relief.    Medications: Patient's Medications  New Prescriptions   No medications on file  Previous Medications   ACETAMINOPHEN (TYLENOL) 650 MG CR TABLET    Take 1,950 mg by mouth 2 (two) times daily.   ATORVASTATIN (LIPITOR) 40 MG TABLET    take 1 tablet once daily   DULOXETINE (CYMBALTA) 60 MG CAPSULE    Take one CAPSULE by mouth twice daily to help neuropathy   GLUCOSE BLOOD (ACCU-CHEK AVIVA PLUS) TEST STRIP    1  each by Other route See admin instructions. Check blood sugar twice daily   INSULIN GLARGINE (LANTUS SOLOSTAR) 100 UNIT/ML SOLOSTAR PEN    Inject 24 Units into the skin daily.   LISINOPRIL (PRINIVIL,ZESTRIL) 20 MG TABLET    Take 1 tablet (20 mg total) by mouth daily.   METFORMIN (GLUCOPHAGE) 1000 MG TABLET    Take 1 tablet (1,000 mg total) by mouth 2 (two) times daily with a meal.   METHADONE (DOLOPHINE) 10 MG TABLET    Take One tablet by mouth 4 times daily  to control pain   METHYLNALTREXONE BROMIDE (RELISTOR) 150 MG TABS    Take 450 mg by mouth daily.   METOLAZONE (ZAROXOLYN) 2.5 MG TABLET    TAKE 1 TABLET(2.5 MG) BY MOUTH DAILY   METOPROLOL (LOPRESSOR) 50 MG TABLET    Take 1 tablet (50 mg total) by mouth 2 (two) times daily.   MULTIPLE VITAMIN (MULITIVITAMIN WITH MINERALS) TABS    Take 1 tablet by mouth daily.   NALOXEGOL OXALATE (MOVANTIK) 25 MG TABS TABLET    Take 1 tablet (25 mg total) by mouth daily.   NYSTATIN CREAM (MYCOSTATIN)    APPLY TO THE AFFECTED AREA(S) TWICE DAILY   PANTOPRAZOLE (PROTONIX) 40 MG TABLET    Take 1 tablet (40 mg total) by mouth 2 (two) times daily.   POLYETHYLENE GLYCOL (MIRALAX / GLYCOLAX) PACKET  Take 17 g by mouth daily as needed for mild constipation or moderate constipation.   PREGABALIN (LYRICA) 150 MG CAPSULE    TAKE ONE CAPSULE THREE TIMES DAILY TO HELP WITH NERVE PAIN   TORSEMIDE (DEMADEX) 100 MG TABLET    Take one tablet by mouth twice daily   TRAZODONE (DESYREL) 100 MG TABLET    Take 3 tablets (300 mg total) by mouth at bedtime.   TRIAMCINOLONE CREAM (KENALOG) 0.1 %    APPLY TO THE AFFECTED AREA(S) SPARINGLY TWICE DAILY UNTIL FOR RASH IS RESOLVED.  Modified Medications   No medications on file  Discontinued Medications   CIPROFLOXACIN (CIPRO) 500 MG TABLET    One twice daily for infection    Review of Systems  Constitutional: Positive for activity change and fatigue. Negative for appetite change, fever and unexpected weight change.       Morbidly  obese  HENT: Negative for congestion, ear pain, hearing loss, rhinorrhea, sore throat, tinnitus, trouble swallowing and voice change.   Eyes:       Corrective lenses  Respiratory: Positive for shortness of breath. Negative for cough, choking, chest tightness and wheezing.   Cardiovascular: Positive for chest pain and leg swelling. Negative for palpitations.  Gastrointestinal: Negative for abdominal distention, abdominal pain, constipation, diarrhea and nausea.  Endocrine: Negative for cold intolerance, heat intolerance, polydipsia, polyphagia and polyuria.  Genitourinary: Negative for dysuria, frequency, testicular pain and urgency.       Not incontinent  Musculoskeletal: Positive for back pain. Negative for arthralgias, gait problem, myalgias and neck pain.       Tender left ankle.  Skin: Positive for rash.       erythema of the anterior shins. History of intertrigo under the breasts and in the groin area bilaterally.  Blotchy, red facial rash present since Spring 2015. Chronic venous stasis changes of both lower legs. Abrasion of the right knee laterally.  Allergic/Immunologic: Negative.   Neurological: Positive for light-headedness and numbness (both hands demonstrate findiings compatible with carpal tunnel syndrome). Negative for dizziness, tremors, syncope, speech difficulty, weakness and headaches.       Wears glove on the right hand due to chronic painful median nerve neuropathy.History of painful neuropathy of the feet and lower legs.  Hematological: Negative for adenopathy. Does not bruise/bleed easily.  Psychiatric/Behavioral: Negative for behavioral problems, confusion, decreased concentration, hallucinations and sleep disturbance. The patient is not nervous/anxious.        Mild memory difficulty    Vitals:   06/24/16 1159  BP: 110/64  Pulse: 69  Temp: 98.4 F (36.9 C)  TempSrc: Oral  SpO2: 94%  Weight: (!) 395 lb (179.2 kg)  Height: '5\' 9"'  (1.753 m)   Body mass index  is 58.33 kg/m. Wt Readings from Last 3 Encounters:  06/24/16 (!) 395 lb (179.2 kg)  06/10/16 (!) 392 lb (177.8 kg)  03/26/16 (!) 391 lb 12.8 oz (177.7 kg)      Physical Exam  Constitutional: He is oriented to person, place, and time. He appears well-developed and well-nourished. No distress.  Morbid obesity  HENT:  Right Ear: External ear normal.  Left Ear: External ear normal.  Nose: Nose normal.  Mouth/Throat: Oropharynx is clear and moist. No oropharyngeal exudate.  Eyes: Conjunctivae and EOM are normal. Pupils are equal, round, and reactive to light.  Neck: No JVD present. No tracheal deviation present. No thyromegaly present.  Cardiovascular: Normal rate, regular rhythm, normal heart sounds and intact distal pulses.  Exam reveals no gallop  and no friction rub.   No murmur heard. Pulmonary/Chest: No respiratory distress. He has no wheezes. He has no rales. He exhibits no tenderness.  Abdominal: He exhibits no distension and no mass. There is no tenderness.  Musculoskeletal: He exhibits edema. He exhibits no tenderness.  Using walker. Unstable gait. Tendeer left ankle.  Lymphadenopathy:    He has no cervical adenopathy.  Neurological: He is alert and oriented to person, place, and time. He has normal reflexes. No cranial nerve deficit. Coordination normal.  08/15/14 MMSE 27/30 09/06/2014  PNCV showed severe bilateral median nerve neuropathy consistent with severe carpal tunnel syndrome.  Skin: There is erythema (both shins). No pallor.  Chronic venous stasis changes of both anterior legs. Abrasion right knee laterally. Multiple open sores of both lower legs. Purple discoloration and scarring of the right medial calf area.  Psychiatric: He has a normal mood and affect. His behavior is normal. Judgment and thought content normal.    Labs reviewed: Lab Summary Latest Ref Rng & Units 06/10/2016 01/28/2016 12/30/2015  Hemoglobin 13.2 - 17.1 g/dL 12.7(L) 13.1(L) (None)  Hematocrit  38.5 - 50.0 % 38.7 39.6 (None)  White count 3.8 - 10.8 K/uL 9.0 6.8 (None)  Platelet count 140 - 400 K/uL 258 209 (None)  Sodium 135 - 146 mmol/L 144 140 136  Potassium 3.5 - 5.3 mmol/L 4.2 4.1 4.5  Calcium 8.6 - 10.3 mg/dL 9.2 9.5 10.0  Phosphorus - (None) (None) (None)  Creatinine 0.70 - 1.25 mg/dL 1.25 1.16 1.59(H)  AST 10 - 35 U/L 30 (None) (None)  Alk Phos 40 - 115 U/L 45 (None) (None)  Bilirubin 0.2 - 1.2 mg/dL 0.4 (None) (None)  Glucose 65 - 99 mg/dL 116(H) 173(H) 219(H)  Cholesterol - (None) (None) (None)  HDL cholesterol - (None) (None) (None)  Triglycerides - (None) (None) (None)  LDL Direct - (None) (None) (None)  LDL Calc - (None) (None) (None)  Total protein 6.1 - 8.1 g/dL 7.2 (None) (None)  Albumin 3.6 - 5.1 g/dL 3.7 (None) (None)  Some recent data might be hidden   Lab Results  Component Value Date   TSH 2.66 06/10/2016   TSH 2.530 03/14/2014   TSH 1.470 12/26/2013   Lab Results  Component Value Date   BUN 16 06/10/2016   BUN 22 01/28/2016   BUN 36 (H) 12/30/2015   Lab Results  Component Value Date   HGBA1C 6.9 (H) 06/10/2016   HGBA1C 7.7 (H) 12/10/2015   HGBA1C 8.4 (H) 02/12/2015    Assessment/Plan  1. Edema, unspecified type Worse. Needs to resume metolazone (ZAROXOLYN) 2.5 MG tablet; One daily to help reduce edema  Dispense: 30 tablet; Refill: 5 - discontinue Lyrica  2. Rash and nonspecific skin eruption - triamcinolone cream (KENALOG) 0.1 %; Apply sparingly to rash up to twice daily  Dispense: 45 g; Refill: 2  3. Obesity, morbid ([place]) Emphasized dietary compliance.  4. Essential hypertension The current medical regimen is effective;  continue present plan and medications.  5. CKD (chronic kidney disease) stage 3, GFR 30-59 ml/min stable  6. Neuropathy, median nerve, unspecified laterality -discontinued Lyrica due to the edema - methadone (DOLOPHINE) 10 MG tablet; Take 1 or 2 tablets by mouth up to 4 times daily  to control pain  Dispense:  180 tablet; Refill: 0  7. Constipation, unspecified constipation type -continue Movantik  8. Atrial fibrillation with RVR (HCC) Currently in NSR  9. DM type 2, uncontrolled, with neuropathy ([place]) controlled  10. Hereditary and idiopathic peripheral  neuropathy -continue methadone  11. Intertrigo - nystatin cream (MYCOSTATIN); Apply to yeast dermatitis in groin daily  Dispense: 30 g; Refill: 2

## 2016-07-02 ENCOUNTER — Telehealth: Payer: Self-pay

## 2016-07-02 NOTE — Telephone Encounter (Signed)
Message left on clinical intake voicemail:    Patient would like a rx for cream rx'd about a year ago. Patient has not had to use cream until 3 days ago and it seems to have cleared up legs.  I called patient to clarify the name of cream. Patient states he thinks the cream is called: CareVe. Patient is using for red rash and dry skin.    I reviewed historical medications and was unable to locate a cream with the name CareVe or similar   Please advise

## 2016-07-04 NOTE — Telephone Encounter (Signed)
Perhaps this was something used by the previous Home health nurse?

## 2016-07-07 NOTE — Telephone Encounter (Signed)
Called the patient about the CareVe cream. This is not a Rx cream, it is an OTC cream, he could get at [facility], [facility], any drug store. The Home Health nurse must of brought it to him a year ago. He was ok with this and will look for it at the store.

## 2016-07-08 ENCOUNTER — Other Ambulatory Visit: Payer: Self-pay | Admitting: Internal Medicine

## 2016-07-08 DIAGNOSIS — F3289 Other specified depressive episodes: Secondary | ICD-10-CM

## 2016-07-12 ENCOUNTER — Other Ambulatory Visit: Payer: Self-pay | Admitting: Internal Medicine

## 2016-07-15 LAB — HM DIABETES EYE EXAM

## 2016-07-22 ENCOUNTER — Encounter: Payer: Self-pay | Admitting: Internal Medicine

## 2016-07-22 ENCOUNTER — Ambulatory Visit (INDEPENDENT_AMBULATORY_CARE_PROVIDER_SITE_OTHER): Payer: Medicare Other | Admitting: Internal Medicine

## 2016-07-22 VITALS — BP 178/104 | HR 72 | Temp 97.7°F | Ht 69.0 in | Wt 389.2 lb

## 2016-07-22 DIAGNOSIS — N183 Chronic kidney disease, stage 3 unspecified: Secondary | ICD-10-CM

## 2016-07-22 DIAGNOSIS — E1165 Type 2 diabetes mellitus with hyperglycemia: Secondary | ICD-10-CM | POA: Diagnosis not present

## 2016-07-22 DIAGNOSIS — I10 Essential (primary) hypertension: Secondary | ICD-10-CM

## 2016-07-22 DIAGNOSIS — E7800 Pure hypercholesterolemia, unspecified: Secondary | ICD-10-CM | POA: Diagnosis not present

## 2016-07-22 DIAGNOSIS — R609 Edema, unspecified: Secondary | ICD-10-CM | POA: Diagnosis not present

## 2016-07-22 DIAGNOSIS — IMO0002 Reserved for concepts with insufficient information to code with codable children: Secondary | ICD-10-CM

## 2016-07-22 DIAGNOSIS — E1140 Type 2 diabetes mellitus with diabetic neuropathy, unspecified: Secondary | ICD-10-CM | POA: Diagnosis not present

## 2016-07-22 NOTE — Progress Notes (Signed)
Facility  [facility]    Place of Service:   OFFICE    Allergies  Allergen Reactions  . Adhesive [Tape] Other (See Comments)    Unknown paper tape ok to use   . Feldene [Piroxicam] Other (See Comments)    unknown  . Latex Other (See Comments)    Rash and itching    Chief Complaint  Patient presents with  . Medical Management of Chronic Issues    4 week routine visit, patient stated the he did take his BP meds today    HPI:  Stopped Lyrica last visit. Pains increased, but edema is improving and weight is down 6#.  Legs feel weak.  Edema, unspecified type - somewhat improved. Legs are not as tense.  Essential hypertension - Elevated.  Obesity, morbid ([place]) - losing weight  DM type 2, uncontrolled, with neuropathy ([place]) - under control. Home glucose 90-150  CKD (chronic kidney disease) stage 3, GFR 30-59 ml/min - no recent lab  Hypercholesterolemia - no recent lab      Medications: Patient's Medications  New Prescriptions   No medications on file  Previous Medications   ACETAMINOPHEN (TYLENOL) 650 MG CR TABLET    Take 1,950 mg by mouth 2 (two) times daily.   ATORVASTATIN (LIPITOR) 40 MG TABLET    take 1 tablet once daily   DULOXETINE (CYMBALTA) 60 MG CAPSULE    TAKE ONE CAPSULE BY MOUTH TWICE DAILY TO HELP NEUROPATHY.   GLUCOSE BLOOD (ACCU-CHEK AVIVA PLUS) TEST STRIP    1 each by Other route See admin instructions. Check blood sugar twice daily   INSULIN GLARGINE (LANTUS SOLOSTAR) 100 UNIT/ML SOLOSTAR PEN    Inject 24 Units into the skin daily.   LANTUS SOLOSTAR 100 UNIT/ML SOLOSTAR PEN    inject 22-24 UNITS EVERY MORNING   LISINOPRIL (PRINIVIL,ZESTRIL) 20 MG TABLET    Take 1 tablet (20 mg total) by mouth daily.   LYRICA 150 MG CAPSULE    TAKE ONE CAPSULE THREE TIMES DAILY TO HELP WITH NERVE PAIN   METFORMIN (GLUCOPHAGE) 1000 MG TABLET    Take 1 tablet (1,000 mg total) by mouth 2 (two) times daily with a meal.   METHADONE (DOLOPHINE) 10 MG TABLET    Take 1 or 2  tablets by mouth up to 4 times daily  to control pain   METOLAZONE (ZAROXOLYN) 2.5 MG TABLET    One daily to help reduce edema   METOPROLOL (LOPRESSOR) 50 MG TABLET    Take 1 tablet (50 mg total) by mouth 2 (two) times daily.   MULTIPLE VITAMIN (MULITIVITAMIN WITH MINERALS) TABS    Take 1 tablet by mouth daily.   NALOXEGOL OXALATE (MOVANTIK) 25 MG TABS TABLET    Take 1 tablet (25 mg total) by mouth daily.   NYSTATIN CREAM (MYCOSTATIN)    Apply to yeast dermatitis in groin daily   PANTOPRAZOLE (PROTONIX) 40 MG TABLET    Take 1 tablet (40 mg total) by mouth 2 (two) times daily.   POLYETHYLENE GLYCOL (MIRALAX / GLYCOLAX) PACKET    Take 17 g by mouth daily as needed for mild constipation or moderate constipation.   TORSEMIDE (DEMADEX) 100 MG TABLET    Take one tablet by mouth twice daily   TRAZODONE (DESYREL) 100 MG TABLET    Take 3 tablets (300 mg total) by mouth at bedtime.   TRIAMCINOLONE CREAM (KENALOG) 0.1 %    Apply sparingly to rash up to twice daily  Modified Medications  No medications on file  Discontinued Medications   No medications on file    Review of Systems  Constitutional: Positive for activity change and fatigue. Negative for appetite change, fever and unexpected weight change.       Morbidly obese  HENT: Negative for congestion, ear pain, hearing loss, rhinorrhea, sore throat, tinnitus, trouble swallowing and voice change.   Eyes:       Corrective lenses  Respiratory: Positive for shortness of breath. Negative for cough, choking, chest tightness and wheezing.   Cardiovascular: Positive for chest pain and leg swelling. Negative for palpitations.  Gastrointestinal: Negative for abdominal distention, abdominal pain, constipation, diarrhea and nausea.  Endocrine: Negative for cold intolerance, heat intolerance, polydipsia, polyphagia and polyuria.  Genitourinary: Negative for dysuria, frequency, testicular pain and urgency.       Not incontinent  Musculoskeletal: Positive for  back pain. Negative for arthralgias, gait problem, myalgias and neck pain.       Tender left ankle.  Skin: Positive for rash.       erythema of the anterior shins. History of intertrigo under the breasts and in the groin area bilaterally.  Blotchy, red facial rash present since Spring 2015. Chronic venous stasis changes of both lower legs. Abrasion of the right knee laterally.  Allergic/Immunologic: Negative.   Neurological: Positive for light-headedness and numbness (both hands demonstrate findiings compatible with carpal tunnel syndrome). Negative for dizziness, tremors, syncope, speech difficulty, weakness and headaches.       Wears glove on the right hand due to chronic painful median nerve neuropathy.History of painful neuropathy of the feet and lower legs.  Hematological: Negative for adenopathy. Does not bruise/bleed easily.  Psychiatric/Behavioral: Negative for behavioral problems, confusion, decreased concentration, hallucinations and sleep disturbance. The patient is not nervous/anxious.        Mild memory difficulty    Vitals:   07/22/16 1234 07/22/16 1242 07/22/16 1318  BP: (!) 182/92 (!) 184/96 (!) 178/104  Pulse: 69  72  Temp: 97.7 F (36.5 C)    TempSrc: Oral    SpO2: 96%    Weight: (!) 389 lb 3.2 oz (176.5 kg)    Height: '5\' 9"'  (1.753 m)     Body mass index is 57.47 kg/m. Wt Readings from Last 3 Encounters:  07/22/16 (!) 389 lb 3.2 oz (176.5 kg)  06/24/16 (!) 395 lb (179.2 kg)  06/10/16 (!) 392 lb (177.8 kg)      Physical Exam  Constitutional: He is oriented to person, place, and time. He appears well-developed and well-nourished. No distress.  Morbid obesity  HENT:  Right Ear: External ear normal.  Left Ear: External ear normal.  Nose: Nose normal.  Mouth/Throat: Oropharynx is clear and moist. No oropharyngeal exudate.  Eyes: Conjunctivae and EOM are normal. Pupils are equal, round, and reactive to light.  Neck: No JVD present. No tracheal deviation  present. No thyromegaly present.  Cardiovascular: Normal rate, regular rhythm, normal heart sounds and intact distal pulses.  Exam reveals no gallop and no friction rub.   No murmur heard. Pulmonary/Chest: No respiratory distress. He has no wheezes. He has no rales. He exhibits no tenderness.  Abdominal: He exhibits no distension and no mass. There is no tenderness.  Musculoskeletal: He exhibits edema. He exhibits no tenderness.  Using walker. Unstable gait. Tendeer left ankle.  Lymphadenopathy:    He has no cervical adenopathy.  Neurological: He is alert and oriented to person, place, and time. He has normal reflexes. No cranial nerve deficit. Coordination  normal.  08/15/14 MMSE 27/30 09/06/2014  PNCV showed severe bilateral median nerve neuropathy consistent with severe carpal tunnel syndrome.  Skin: There is erythema (both shins). No pallor.  Chronic venous stasis changes of both anterior legs. Abrasion right knee laterally. Multiple open sores of both lower legs. Purple discoloration and scarring of the right medial calf area.  Psychiatric: He has a normal mood and affect. His behavior is normal. Judgment and thought content normal.    Labs reviewed: Lab Summary Latest Ref Rng & Units 06/10/2016 01/28/2016 12/30/2015  Hemoglobin 13.2 - 17.1 g/dL 12.7(L) 13.1(L) (None)  Hematocrit 38.5 - 50.0 % 38.7 39.6 (None)  White count 3.8 - 10.8 K/uL 9.0 6.8 (None)  Platelet count 140 - 400 K/uL 258 209 (None)  Sodium 135 - 146 mmol/L 144 140 136  Potassium 3.5 - 5.3 mmol/L 4.2 4.1 4.5  Calcium 8.6 - 10.3 mg/dL 9.2 9.5 10.0  Phosphorus - (None) (None) (None)  Creatinine 0.70 - 1.25 mg/dL 1.25 1.16 1.59(H)  AST 10 - 35 U/L 30 (None) (None)  Alk Phos 40 - 115 U/L 45 (None) (None)  Bilirubin 0.2 - 1.2 mg/dL 0.4 (None) (None)  Glucose 65 - 99 mg/dL 116(H) 173(H) 219(H)  Cholesterol - (None) (None) (None)  HDL cholesterol - (None) (None) (None)  Triglycerides - (None) (None) (None)  LDL Direct -  (None) (None) (None)  LDL Calc - (None) (None) (None)  Total protein 6.1 - 8.1 g/dL 7.2 (None) (None)  Albumin 3.6 - 5.1 g/dL 3.7 (None) (None)  Some recent data might be hidden   Lab Results  Component Value Date   TSH 2.66 06/10/2016   TSH 2.530 03/14/2014   TSH 1.470 12/26/2013   Lab Results  Component Value Date   BUN 16 06/10/2016   BUN 22 01/28/2016   BUN 36 (H) 12/30/2015   Lab Results  Component Value Date   HGBA1C 6.9 (H) 06/10/2016   HGBA1C 7.7 (H) 12/10/2015   HGBA1C 8.4 (H) 02/12/2015    Assessment/Plan  1. Edema, unspecified type The current medical regimen is effective;  continue present plan and medications.  2. Essential hypertension Poor control. Take medications regularly. - Comprehensive metabolic panel; Future  3. Obesity, morbid ([place]) Continue to try to diet  4. DM type 2, uncontrolled, with neuropathy ([place]) The current medical regimen is effective;  continue present plan and medications. - Hemoglobin A1c; Future - Comprehensive metabolic panel; Future  5. CKD (chronic kidney disease) stage 3, GFR 30-59 ml/min - Comprehensive metabolic panel; Future  6. Hypercholesterolemia - Lipid panel; Future

## 2016-07-28 ENCOUNTER — Other Ambulatory Visit: Payer: Self-pay | Admitting: *Deleted

## 2016-07-28 DIAGNOSIS — G5610 Other lesions of median nerve, unspecified upper limb: Secondary | ICD-10-CM

## 2016-07-28 MED ORDER — METHADONE HCL 10 MG PO TABS
ORAL_TABLET | ORAL | 0 refills | Status: DC
Start: 2016-07-28 — End: 2016-08-26

## 2016-07-28 NOTE — Telephone Encounter (Signed)
Patient requested and will pick up 

## 2016-08-10 ENCOUNTER — Other Ambulatory Visit: Payer: Self-pay | Admitting: Internal Medicine

## 2016-08-10 DIAGNOSIS — F3289 Other specified depressive episodes: Secondary | ICD-10-CM

## 2016-08-26 ENCOUNTER — Other Ambulatory Visit: Payer: Self-pay | Admitting: *Deleted

## 2016-08-26 DIAGNOSIS — G5610 Other lesions of median nerve, unspecified upper limb: Secondary | ICD-10-CM

## 2016-08-26 MED ORDER — METHADONE HCL 10 MG PO TABS: ORAL_TABLET | ORAL | 0 refills | Status: DC

## 2016-08-26 NOTE — Telephone Encounter (Signed)
Patient requested and will pick up 

## 2016-09-03 ENCOUNTER — Ambulatory Visit (INDEPENDENT_AMBULATORY_CARE_PROVIDER_SITE_OTHER): Payer: Medicare Other | Admitting: Nurse Practitioner

## 2016-09-03 ENCOUNTER — Encounter: Payer: Self-pay | Admitting: Nurse Practitioner

## 2016-09-03 VITALS — BP 158/86 | HR 74 | Temp 97.9°F | Resp 18 | Ht 69.0 in | Wt 366.6 lb

## 2016-09-03 DIAGNOSIS — Z794 Long term (current) use of insulin: Secondary | ICD-10-CM | POA: Diagnosis not present

## 2016-09-03 DIAGNOSIS — G5610 Other lesions of median nerve, unspecified upper limb: Secondary | ICD-10-CM | POA: Diagnosis not present

## 2016-09-03 DIAGNOSIS — E1140 Type 2 diabetes mellitus with diabetic neuropathy, unspecified: Secondary | ICD-10-CM | POA: Diagnosis not present

## 2016-09-03 DIAGNOSIS — R609 Edema, unspecified: Secondary | ICD-10-CM | POA: Diagnosis not present

## 2016-09-03 MED ORDER — GABAPENTIN 100 MG PO CAPS: 100.0000 mg | ORAL_CAPSULE | Freq: Three times a day (TID) | ORAL | 3 refills | Status: DC

## 2016-09-03 MED ORDER — ZOSTER VAC RECOMB ADJUVANTED 50 MCG/0.5ML IM SUSR: 0.5000 mL | Freq: Once | INTRAMUSCULAR | 1 refills | Status: AC

## 2016-09-03 NOTE — Progress Notes (Signed)
Careteam: Patient Care Team: [name], NP as PCP - General (Geriatric Medicine)  Advanced Directive information Does Patient Have a Medical Advance Directive?: No  Allergies  Allergen Reactions  . Adhesive [Tape] Other (See Comments)    Unknown paper tape ok to use   . Feldene [Piroxicam] Other (See Comments)    unknown  . Latex Other (See Comments)    Rash and itching    Chief Complaint  Patient presents with  . Acute Visit    Pt is being seen due to increased neuropathy throughout entire body but especially hands. Pt was on lyrica but he was taken off of it due to leg swelling.      HPI: Patient is a 66 y.o. male seen in the office today due to neuropathy. Pt with hx of carpel tunnel, DM, CKD, HTN, hyperlipidemia. Pt was previous on lyrica but taken off due to leg swelling. When he stopped lyrica the swelling improved.  Legs were twice the size Was supposed to have carpel tunnel procedure but it was too expensive (co-pay around 400$) so he could not go through with it.  From the bottom of his feet to the top of his head his nerves hurt. "screaming at me" very intense 2 nights ago. Worse it has ever been.  Currently taking Cymbalta 60 mg twice daily (max dose) helped some at first.  Was on lyrica for over a year and been off for 3 months.   Review of Systems:  Review of Systems  Constitutional: Negative for chills, fever and weight loss.  HENT: Negative for tinnitus.   Respiratory: Negative for cough, sputum production and shortness of breath.   Cardiovascular: Positive for leg swelling (improved). Negative for chest pain and palpitations.  Gastrointestinal: Negative for abdominal pain, constipation, diarrhea and heartburn.  Genitourinary: Negative for dysuria, frequency and urgency.  Musculoskeletal: Positive for back pain. Negative for falls, joint pain and myalgias.  Skin: Negative.   Neurological: Positive for tingling and sensory change. Negative for  dizziness and headaches.  Psychiatric/Behavioral: Negative for depression and memory loss. The patient does not have insomnia.     Past Medical History:  Diagnosis Date  . Anemia   . Angina   . Anxiety   . Arthritis   . Atrial fibrillation with RVR ([place]) 01/24/2013  . Blood transfusion    last 10' 14- "GI bleed"  . CHF (congestive heart failure) ([place])   . CKD (chronic kidney disease) stage 3, GFR 30-59 ml/min 01/08/2016  . Depression   . Diabetes mellitus ([place]) 09/18/2011  . Diverticulosis   . DJD (degenerative joint disease)   . DM type 2, uncontrolled, with neuropathy ([place]) 12/26/2013  . Edema extremities    bilateral lower extremities-"weepy areas due to fluid retention"  . Fatty liver   . Fundic gland polyps of stomach, benign   . Headache(784.0)   . Hepatitis B   . History of alcohol abuse    last drink in 1993  . Hypertension   . Neuromuscular disorder ([place])   . 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 Obesity   . Obesity, morbid ([place]) 07/30/2015  . Pneumonia    not at present time  . Renal insufficiency   . Shortness of breath   . Sleep apnea, obstructive    does where bipap. 06-04-14 "doesn't use much now, no mask/tubing now.   Past Surgical History:  Procedure Laterality Date  . ABDOMINAL SURGERY     [phone]  .  BACK SURGERY     x 8 -,multiple fusions(cervical to lumbar)  . COLONOSCOPY    . COLONOSCOPY WITH PROPOFOL N/A 06/14/2014   Procedure: COLONOSCOPY WITH PROPOFOL;  Surgeon: Gatha Mayer, MD;  Location: [facility];  Service: Endoscopy;  Laterality: N/A;  . DIAGNOSTIC LAPAROSCOPY    . ESOPHAGOGASTRODUODENOSCOPY N/A 01/03/2013   Procedure: ESOPHAGOGASTRODUODENOSCOPY (EGD);  Surgeon: Arta Silence, MD;  Location: [facility];  Service: Endoscopy;  Laterality: N/A;  . ESOPHAGOGASTRODUODENOSCOPY N/A 01/03/2013   Procedure: ESOPHAGOGASTRODUODENOSCOPY (EGD);  Surgeon: Arta Silence, MD;  Location: [facility];  Service: Endoscopy;  Laterality:  N/A;  . ESOPHAGOGASTRODUODENOSCOPY N/A 01/23/2013   Procedure: ESOPHAGOGASTRODUODENOSCOPY (EGD);  Surgeon: Lear Ng, MD;  Location: [facility] ENDOSCOPY;  Service: Endoscopy;  Laterality: N/A;  . ESOPHAGOGASTRODUODENOSCOPY Left 01/27/2013   Procedure: ESOPHAGOGASTRODUODENOSCOPY (EGD);  Surgeon: Lear Ng, MD;  Location: [facility];  Service: Endoscopy;  Laterality: Left;  . ESOPHAGOGASTRODUODENOSCOPY N/A 06/14/2014   Procedure: ESOPHAGOGASTRODUODENOSCOPY (EGD);  Surgeon: Gatha Mayer, MD;  Location: [facility];  Service: Endoscopy;  Laterality: N/A;  . ESOPHAGOSCOPY N/A 01/01/2013   Procedure: ESOPHAGOSCOPY;  Surgeon: Jeryl Columbia, MD;  Location: [facility];  Service: Endoscopy;  Laterality: N/A;  . GASTRIC BYPASS  1977   Reversed in 1992 and revision 1994  . HYDROCELE EXCISION  1996   x 2   . KNEE SURGERY Left 2004   Social History:   reports that he has never smoked. He has never used smokeless tobacco. He reports that he does not drink alcohol or use drugs.  Family History  Problem Relation Age of Onset  . Heart disease Father   . Skin cancer Father   . COPD Father   . Hypertension Father   . Stroke Father   . Arthritis Mother   . Osteoporosis Mother   . Diabetes Daughter   . Depression Son   . Heart disease Maternal Uncle   . High blood pressure Sister   . High blood pressure Sister   . High blood pressure Son   . Depression Son   . Arthritis Sister   . Arthritis Sister   . Cancer Maternal Grandmother   . Heart disease Maternal Uncle   . Heart disease Paternal Uncle     Medications: Patient's Medications  New Prescriptions   No medications on file  Previous Medications   ACETAMINOPHEN (TYLENOL) 650 MG CR TABLET    Take 1,950 mg by mouth 2 (two) times daily.   ATORVASTATIN (LIPITOR) 40 MG TABLET    take 1 tablet once daily   DULOXETINE (CYMBALTA) 60 MG CAPSULE    TAKE ONE CAPSULE BY MOUTH TWICE DAILY TO HELP NEUROPATHY.   GLUCOSE BLOOD (ACCU-CHEK AVIVA PLUS)  TEST STRIP    1 each by Other route See admin instructions. Check blood sugar twice daily   LANTUS SOLOSTAR 100 UNIT/ML SOLOSTAR PEN    inject 22-24 UNITS EVERY MORNING   LISINOPRIL (PRINIVIL,ZESTRIL) 20 MG TABLET    Take 1 tablet (20 mg total) by mouth daily.   LYRICA 150 MG CAPSULE    TAKE ONE CAPSULE THREE TIMES DAILY TO HELP WITH NERVE PAIN   METFORMIN (GLUCOPHAGE) 1000 MG TABLET    Take 1 tablet (1,000 mg total) by mouth 2 (two) times daily with a meal.   METHADONE (DOLOPHINE) 10 MG TABLET    Take 1 or 2 tablets by mouth up to 4 times daily  to control pain   METOLAZONE (ZAROXOLYN) 2.5 MG TABLET  One daily to help reduce edema   METOPROLOL (LOPRESSOR) 50 MG TABLET    Take 1 tablet (50 mg total) by mouth 2 (two) times daily.   MULTIPLE VITAMIN (MULITIVITAMIN WITH MINERALS) TABS    Take 1 tablet by mouth daily.   NALOXEGOL OXALATE (MOVANTIK) 25 MG TABS TABLET    Take 1 tablet (25 mg total) by mouth daily.   NYSTATIN CREAM (MYCOSTATIN)    Apply to yeast dermatitis in groin daily   PANTOPRAZOLE (PROTONIX) 40 MG TABLET    Take 1 tablet (40 mg total) by mouth 2 (two) times daily.   POLYETHYLENE GLYCOL (MIRALAX / GLYCOLAX) PACKET    Take 17 g by mouth daily as needed for mild constipation or moderate constipation.   TORSEMIDE (DEMADEX) 100 MG TABLET    Take one tablet by mouth twice daily   TRAZODONE (DESYREL) 100 MG TABLET    Take 3 tablets (300 mg total) by mouth at bedtime.   TRIAMCINOLONE CREAM (KENALOG) 0.1 %    Apply sparingly to rash up to twice daily  Modified Medications   Modified Medication Previous Medication   ZOSTER VAC RECOMB ADJUVANTED (SHINGRIX) INJECTION Zoster Vac Recomb Adjuvanted (SHINGRIX) injection      Inject 0.5 mLs into the muscle once.    Inject 0.5 mLs into the muscle once.  Discontinued Medications   INSULIN GLARGINE (LANTUS SOLOSTAR) 100 UNIT/ML SOLOSTAR PEN    Inject 24 Units into the skin daily.     Physical Exam:  Vitals:   09/03/16 1033  BP: (!) 158/86    Pulse: 74  Resp: 18  Temp: 97.9 F (36.6 C)  TempSrc: Oral  SpO2: 96%  Weight: (!) 366 lb 9.6 oz (166.3 kg)  Height: 5\' 9"  (1.753 m)   Body mass index is 54.14 kg/m.  Physical Exam  Constitutional: He is oriented to person, place, and time. He appears well-developed and well-nourished. No distress.  Morbid obesity  HENT:  Right Ear: External ear normal.  Left Ear: External ear normal.  Nose: Nose normal.  Mouth/Throat: Oropharynx is clear and moist. No oropharyngeal exudate.  Eyes: Conjunctivae and EOM are normal. Pupils are equal, round, and reactive to light.  Neck: No JVD present. No tracheal deviation present. No thyromegaly present.  Cardiovascular: Normal rate, regular rhythm, normal heart sounds and intact distal pulses.  Exam reveals no gallop and no friction rub.   No murmur heard. Pulmonary/Chest: Effort normal and breath sounds normal. No respiratory distress. He has no wheezes. He has no rales. He exhibits no tenderness.  Abdominal: He exhibits no distension and no mass. There is no tenderness.  Musculoskeletal: He exhibits edema. He exhibits no tenderness.  Using walker. Unstable gait. Tendeer left ankle.  Lymphadenopathy:    He has no cervical adenopathy.  Neurological: He is alert and oriented to person, place, and time. He has normal reflexes. No cranial nerve deficit. Coordination normal.  08/15/14 MMSE 27/30 09/06/2014  PNCV showed severe bilateral median nerve neuropathy consistent with severe carpal tunnel syndrome.  Skin: There is erythema. No pallor.  Chronic venous stasis changes of both anterior legs. Abrasion right knee laterally. Purple discoloration and scarring of the right medial calf area.  Psychiatric: He has a normal mood and affect. His behavior is normal. Thought content normal.    Labs reviewed: Basic Metabolic Panel:  Recent Labs  12/30/15 1555 01/28/16 1032 06/10/16 1535  NA 136 140 144  K 4.5 4.1 4.2  CL 90* 97* 99  CO2 28  33*  32*  GLUCOSE 219* 173* 116*  BUN 36* 22 16  CREATININE 1.59* 1.16 1.25  CALCIUM 10.0 9.5 9.2  TSH  --   --  2.66   Liver Function Tests:  Recent Labs  12/02/15 1603 12/10/15 1049 06/10/16 1535  AST 25 23 30   ALT 16 18 18   ALKPHOS 53 56 45  BILITOT 0.3 0.5 0.4  PROT 7.0 7.1 7.2  ALBUMIN 3.7 3.8 3.7   No results for input(s): LIPASE, AMYLASE in the last 8760 hours. No results for input(s): AMMONIA in the last 8760 hours. CBC:  Recent Labs  12/02/15 1603 01/28/16 1032 06/10/16 1535  WBC 7.5 6.8 9.0  NEUTROABS 3,750 3,264 4,770  HGB 12.4* 13.1* 12.7*  HCT 37.8* 39.6 38.7  MCV 88.1 87.6 88.8  PLT 229 209 258   Lipid Panel: No results for input(s): CHOL, HDL, LDLCALC, TRIG, CHOLHDL, LDLDIRECT in the last 8760 hours. TSH:  Recent Labs  06/10/16 1535  TSH 2.66   A1C: Lab Results  Component Value Date   HGBA1C 6.9 (H) 06/10/2016     Assessment/Plan 1. Type 2 diabetes mellitus with diabetic neuropathy, with long-term current use of insulin ([place]) -reports blood sugars have been better control. A1c 6.9 in March. Lyrica causing significant LE edema which has currently improved but worsening neuropathy since stopping medication.  conts on Cymbalta.  - gabapentin (NEURONTIN) 100 MG capsule; Take 1 capsule (100 mg total) by mouth 3 (three) times daily.  Dispense: 90 capsule; Refill: 3 -may need more of a dose titration but will start with 100 mg TID.   2. Edema, unspecified type Improved.   3. Neuropathy, median nerve, unspecified laterality Was scheduled for procedure due to carpel tunnel but due to cost had to cancel; conts with brace  -will start gabapentin which will hopefully help this pain as well  Has follow up next month. To call or follow up sooner if needed Edmonia Gonser K. Harle Battiest  [facility] [phone] 8 am - 5 pm) [phone] (after hours)

## 2016-09-03 NOTE — Patient Instructions (Signed)
To start gabapentin 100 mg by mouth three times daily

## 2016-09-10 ENCOUNTER — Other Ambulatory Visit: Payer: Self-pay | Admitting: Nurse Practitioner

## 2016-09-10 ENCOUNTER — Other Ambulatory Visit: Payer: Self-pay | Admitting: Internal Medicine

## 2016-09-17 ENCOUNTER — Other Ambulatory Visit: Payer: Medicare Other

## 2016-09-22 ENCOUNTER — Encounter: Payer: Self-pay | Admitting: Nurse Practitioner

## 2016-09-22 ENCOUNTER — Ambulatory Visit: Payer: Medicare Other | Admitting: Nurse Practitioner

## 2016-09-25 ENCOUNTER — Other Ambulatory Visit: Payer: Self-pay | Admitting: *Deleted

## 2016-09-25 MED ORDER — METOPROLOL TARTRATE 50 MG PO TABS: 50.0000 mg | ORAL_TABLET | Freq: Two times a day (BID) | ORAL | 3 refills | Status: DC

## 2016-09-25 MED ORDER — NALOXEGOL OXALATE 25 MG PO TABS
25.0000 mg | ORAL_TABLET | Freq: Every day | ORAL | 3 refills | Status: DC
Start: 2016-09-25 — End: 2017-02-22

## 2016-09-25 NOTE — Telephone Encounter (Signed)
Patient requested Rx to be faxed to pharmacy. Faxed.

## 2016-09-28 ENCOUNTER — Other Ambulatory Visit: Payer: Medicare Other

## 2016-09-28 NOTE — Addendum Note (Signed)
Addended by: Logan Bores on: 09/28/2016 08:10 AM   Modules accepted: Orders

## 2016-09-30 ENCOUNTER — Other Ambulatory Visit: Payer: Self-pay | Admitting: Nurse Practitioner

## 2016-09-30 ENCOUNTER — Other Ambulatory Visit: Payer: Self-pay | Admitting: *Deleted

## 2016-09-30 DIAGNOSIS — G5610 Other lesions of median nerve, unspecified upper limb: Secondary | ICD-10-CM

## 2016-09-30 MED ORDER — METHADONE HCL 10 MG PO TABS: ORAL_TABLET | ORAL | 0 refills | Status: DC

## 2016-09-30 NOTE — Telephone Encounter (Signed)
Patient requested and will pick up 

## 2016-10-01 ENCOUNTER — Encounter: Payer: Self-pay | Admitting: Nurse Practitioner

## 2016-10-01 ENCOUNTER — Ambulatory Visit: Payer: Medicare Other | Admitting: Nurse Practitioner

## 2016-10-01 ENCOUNTER — Ambulatory Visit (INDEPENDENT_AMBULATORY_CARE_PROVIDER_SITE_OTHER): Payer: Medicare Other | Admitting: Nurse Practitioner

## 2016-10-01 VITALS — BP 144/82 | HR 81 | Temp 98.4°F | Resp 18 | Ht 69.0 in | Wt 367.2 lb

## 2016-10-01 DIAGNOSIS — K2100 Gastro-esophageal reflux disease with esophagitis, without bleeding: Secondary | ICD-10-CM

## 2016-10-01 DIAGNOSIS — G5610 Other lesions of median nerve, unspecified upper limb: Secondary | ICD-10-CM | POA: Diagnosis not present

## 2016-10-01 DIAGNOSIS — M25511 Pain in right shoulder: Secondary | ICD-10-CM

## 2016-10-01 DIAGNOSIS — N183 Chronic kidney disease, stage 3 unspecified: Secondary | ICD-10-CM

## 2016-10-01 DIAGNOSIS — R609 Edema, unspecified: Secondary | ICD-10-CM

## 2016-10-01 DIAGNOSIS — E7800 Pure hypercholesterolemia, unspecified: Secondary | ICD-10-CM | POA: Diagnosis not present

## 2016-10-01 DIAGNOSIS — G609 Hereditary and idiopathic neuropathy, unspecified: Secondary | ICD-10-CM

## 2016-10-01 DIAGNOSIS — I10 Essential (primary) hypertension: Secondary | ICD-10-CM

## 2016-10-01 DIAGNOSIS — Z794 Long term (current) use of insulin: Secondary | ICD-10-CM | POA: Diagnosis not present

## 2016-10-01 DIAGNOSIS — G894 Chronic pain syndrome: Secondary | ICD-10-CM | POA: Diagnosis not present

## 2016-10-01 DIAGNOSIS — E1140 Type 2 diabetes mellitus with diabetic neuropathy, unspecified: Secondary | ICD-10-CM | POA: Diagnosis not present

## 2016-10-01 DIAGNOSIS — M25512 Pain in left shoulder: Secondary | ICD-10-CM

## 2016-10-01 DIAGNOSIS — G8929 Other chronic pain: Secondary | ICD-10-CM

## 2016-10-01 LAB — CBC WITH DIFFERENTIAL/PLATELET
BASOS ABS: 0 {cells}/uL (ref 0–200)
Basophils Relative: 0 %
EOS PCT: 4 %
Eosinophils Absolute: 312 cells/uL (ref 15–500)
HCT: 40.6 % (ref 38.5–50.0)
Hemoglobin: 13.5 g/dL (ref 13.2–17.1)
LYMPHS PCT: 29 %
Lymphs Abs: 2262 cells/uL (ref 850–3900)
MCH: 29.9 pg (ref 27.0–33.0)
MCHC: 33.3 g/dL (ref 32.0–36.0)
MCV: 90 fL (ref 80.0–100.0)
MONOS PCT: 6 %
MPV: 10.2 fL (ref 7.5–12.5)
Monocytes Absolute: 468 cells/uL (ref 200–950)
NEUTROS PCT: 61 %
Neutro Abs: 4758 cells/uL (ref 1500–7800)
PLATELETS: 235 10*3/uL (ref 140–400)
RBC: 4.51 MIL/uL (ref 4.20–5.80)
RDW: 16.1 % — ABNORMAL HIGH (ref 11.0–15.0)
WBC: 7.8 10*3/uL (ref 3.8–10.8)

## 2016-10-01 MED ORDER — ACETAMINOPHEN ER 650 MG PO TBCR
1300.0000 mg | EXTENDED_RELEASE_TABLET | Freq: Two times a day (BID) | ORAL | Status: DC
Start: 2016-10-01 — End: 2019-02-10

## 2016-10-01 MED ORDER — GABAPENTIN 100 MG PO CAPS: 200.0000 mg | ORAL_CAPSULE | Freq: Three times a day (TID) | ORAL | 3 refills | Status: DC

## 2016-10-01 MED ORDER — METHADONE HCL 10 MG PO TABS: ORAL_TABLET | ORAL | 0 refills | Status: DC

## 2016-10-01 MED ORDER — LISINOPRIL 40 MG PO TABS: 40.0000 mg | ORAL_TABLET | Freq: Every day | ORAL | 2 refills | Status: DC

## 2016-10-01 NOTE — Patient Instructions (Addendum)
1) increase your lisinopril to 40 mg daily 2) increase gabapentin to 200 mg by mouth three times daily 3) DECREASE tylenol to 2 tablets = 1300 mg CR twice daily   Referral placed for pain clinic Referral placed to Gastroenterologist.   Follow up in 1 month due to changes in medication

## 2016-10-01 NOTE — Progress Notes (Signed)
Careteam: Patient Care Team: [name], NP as PCP - General (Geriatric Medicine)  Advanced Directive information Does Patient Have a Medical Advance Directive?: No  Allergies  Allergen Reactions  . Adhesive [Tape] Other (See Comments)    Unknown paper tape ok to use   . Feldene [Piroxicam] Other (See Comments)    unknown  . Latex Other (See Comments)    Rash and itching    Chief Complaint  Patient presents with  . Medical Management of Chronic Issues    Pt is being seen for a 2 mouth follow up on DM, edema, neuropathy     HPI: Patient is a 66 y.o. male seen in the office today for routine follow up.  Missed earlier appt because he was playing video games Pt was seen last month due to neuropathy and started on gabapentin which helps but only last a few hours and then pain comes back.  Swelling in minimal. No increase in edema.  Attempting to lose weight with diet Reports his ex-wife and him are trying to get back together after being divorced for 8 years.   Blood sugars-reports fasting blood sugars ~130.   Taking methadone- 2 in the am, 2 in the afternoon, 2 at bedtime to help with chronic back/shoulder pain.  Was in so much pain he could barely walk but now he is able to function.  Would like to come off methadone.  Taking 2000 mg of tylenol twice daily Reports chronic pain to bilateral shoulders, right shoulder with very limited ROM after a fall 8 years ago. States he went to ER at [facility] initially after fall but then never followed up. Eventually went to ortho and had PT, recommended surgery but also never followed up. Now this is starting to bother him more.   Taking protonix BID- conts to have severe GERD/heartburn. Feeling of burning and choking in throat after all meals. Feels like increase acid feeling. Has tried to decrease meals, now can not eat as much due to this. Has progressively gotten worse.   Review of Systems:  Review of Systems    Constitutional: Negative for chills, fever and weight loss.  HENT: Negative for tinnitus.   Respiratory: Negative for cough, sputum production and shortness of breath.   Cardiovascular: Negative for chest pain, palpitations and leg swelling.  Gastrointestinal: Positive for constipation (controlled on medication. ) and heartburn. Negative for abdominal pain and diarrhea.  Genitourinary: Negative for dysuria, frequency and urgency.  Musculoskeletal: Positive for back pain and joint pain (shoulder pain). Negative for falls and myalgias.  Skin: Negative.   Neurological: Positive for tingling and sensory change. Negative for dizziness and headaches.  Psychiatric/Behavioral: Negative for depression and memory loss. The patient has insomnia (controlled on medication).     Past Medical History:  Diagnosis Date  . Anemia   . Angina   . Anxiety   . Arthritis   . Atrial fibrillation with RVR ([place]) 01/24/2013  . Blood transfusion    last 10' 14- "GI bleed"  . CHF (congestive heart failure) ([place])   . CKD (chronic kidney disease) stage 3, GFR 30-59 ml/min 01/08/2016  . Depression   . Diabetes mellitus ([place]) 09/18/2011  . Diverticulosis   . DJD (degenerative joint disease)   . DM type 2, uncontrolled, with neuropathy ([place]) 12/26/2013  . Edema extremities    bilateral lower extremities-"weepy areas due to fluid retention"  . Fatty liver   . Fundic gland polyps of stomach, benign   .  Headache(784.0)   . Hepatitis B   . History of alcohol abuse    last drink in 1993  . Hypertension   . Neuromuscular disorder ([place])   . 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 Obesity   . Obesity, morbid ([place]) 07/30/2015  . Pneumonia    not at present time  . Renal insufficiency   . Shortness of breath   . Sleep apnea, obstructive    does where bipap. 06-04-14 "doesn't use much now, no mask/tubing now.   Past Surgical History:  Procedure Laterality Date  . ABDOMINAL SURGERY     [phone]   . BACK SURGERY     x 8 -,multiple fusions(cervical to lumbar)  . COLONOSCOPY    . COLONOSCOPY WITH PROPOFOL N/A 06/14/2014   Procedure: COLONOSCOPY WITH PROPOFOL;  Surgeon: Gatha Mayer, MD;  Location: [facility];  Service: Endoscopy;  Laterality: N/A;  . DIAGNOSTIC LAPAROSCOPY    . ESOPHAGOGASTRODUODENOSCOPY N/A 01/03/2013   Procedure: ESOPHAGOGASTRODUODENOSCOPY (EGD);  Surgeon: Arta Silence, MD;  Location: [facility];  Service: Endoscopy;  Laterality: N/A;  . ESOPHAGOGASTRODUODENOSCOPY N/A 01/03/2013   Procedure: ESOPHAGOGASTRODUODENOSCOPY (EGD);  Surgeon: Arta Silence, MD;  Location: [facility];  Service: Endoscopy;  Laterality: N/A;  . ESOPHAGOGASTRODUODENOSCOPY N/A 01/23/2013   Procedure: ESOPHAGOGASTRODUODENOSCOPY (EGD);  Surgeon: Lear Ng, MD;  Location: [facility] ENDOSCOPY;  Service: Endoscopy;  Laterality: N/A;  . ESOPHAGOGASTRODUODENOSCOPY Left 01/27/2013   Procedure: ESOPHAGOGASTRODUODENOSCOPY (EGD);  Surgeon: Lear Ng, MD;  Location: [facility];  Service: Endoscopy;  Laterality: Left;  . ESOPHAGOGASTRODUODENOSCOPY N/A 06/14/2014   Procedure: ESOPHAGOGASTRODUODENOSCOPY (EGD);  Surgeon: Gatha Mayer, MD;  Location: [facility];  Service: Endoscopy;  Laterality: N/A;  . ESOPHAGOSCOPY N/A 01/01/2013   Procedure: ESOPHAGOSCOPY;  Surgeon: Jeryl Columbia, MD;  Location: [facility];  Service: Endoscopy;  Laterality: N/A;  . GASTRIC BYPASS  1977   Reversed in 1992 and revision 1994  . HYDROCELE EXCISION  1996   x 2   . KNEE SURGERY Left 2004   Social History:   reports that he has never smoked. He has never used smokeless tobacco. He reports that he does not drink alcohol or use drugs.  Family History  Problem Relation Age of Onset  . Heart disease Father   . Skin cancer Father   . COPD Father   . Hypertension Father   . Stroke Father   . Arthritis Mother   . Osteoporosis Mother   . Diabetes Daughter   . Depression Son   . Heart disease Maternal Uncle   . High  blood pressure Sister   . High blood pressure Sister   . High blood pressure Son   . Depression Son   . Arthritis Sister   . Arthritis Sister   . Cancer Maternal Grandmother   . Heart disease Maternal Uncle   . Heart disease Paternal Uncle     Medications: Patient's Medications  New Prescriptions   No medications on file  Previous Medications   ACETAMINOPHEN (TYLENOL) 650 MG CR TABLET    Take 1,950 mg by mouth 2 (two) times daily.   ATORVASTATIN (LIPITOR) 40 MG TABLET    take 1 tablet once daily   DULOXETINE (CYMBALTA) 60 MG CAPSULE    TAKE 1 CAPSULE BY MOUTH TWICE DAILY   GABAPENTIN (NEURONTIN) 100 MG CAPSULE    Take 1 capsule (100 mg total) by mouth 3 (three) times daily.   GLUCOSE BLOOD (ACCU-CHEK AVIVA PLUS) TEST STRIP    1  each by Other route See admin instructions. Check blood sugar twice daily   LANTUS SOLOSTAR 100 UNIT/ML SOLOSTAR PEN    inject 22-24 UNITS EVERY MORNING   LISINOPRIL (PRINIVIL,ZESTRIL) 20 MG TABLET    Take 1 tablet (20 mg total) by mouth daily.   METFORMIN (GLUCOPHAGE) 1000 MG TABLET    Take 1 tablet (1,000 mg total) by mouth 2 (two) times daily with a meal.   METHADONE (DOLOPHINE) 10 MG TABLET    Take 1 or 2 tablets by mouth up to 4 times daily  to control pain   METOLAZONE (ZAROXOLYN) 2.5 MG TABLET    One daily to help reduce edema   METOPROLOL TARTRATE (LOPRESSOR) 50 MG TABLET    Take 1 tablet (50 mg total) by mouth 2 (two) times daily.   MULTIPLE VITAMIN (MULITIVITAMIN WITH MINERALS) TABS    Take 1 tablet by mouth daily.   NALOXEGOL OXALATE (MOVANTIK) 25 MG TABS TABLET    Take 1 tablet (25 mg total) by mouth daily.   NYSTATIN CREAM (MYCOSTATIN)    Apply to yeast dermatitis in groin daily   PANTOPRAZOLE (PROTONIX) 40 MG TABLET    Take 1 tablet (40 mg total) by mouth 2 (two) times daily.   POLYETHYLENE GLYCOL (MIRALAX / GLYCOLAX) PACKET    Take 17 g by mouth daily as needed for mild constipation or moderate constipation.   TORSEMIDE (DEMADEX) 100 MG TABLET     Take one tablet by mouth twice daily   TRAZODONE (DESYREL) 100 MG TABLET    TAKE 3 TABLETS BY MOUTH AT BEDTIME FOR REST   TRIAMCINOLONE CREAM (KENALOG) 0.1 %    Apply sparingly to rash up to twice daily  Modified Medications   No medications on file  Discontinued Medications   No medications on file     Physical Exam:  Vitals:   10/01/16 1417  BP: (!) 144/82  Pulse: 81  Resp: 18  Temp: 98.4 F (36.9 C)  TempSrc: Oral  SpO2: 97%  Weight: (!) 367 lb 3.2 oz (166.6 kg)  Height: '5\' 9"'  (1.753 m)   Body mass index is 54.23 kg/m.  Physical Exam  Constitutional: He is oriented to person, place, and time. He appears well-developed and well-nourished. No distress.  Morbid obesity  HENT:  Right Ear: External ear normal.  Left Ear: External ear normal.  Nose: Nose normal.  Mouth/Throat: Oropharynx is clear and moist. No oropharyngeal exudate.  Eyes: Pupils are equal, round, and reactive to light. Conjunctivae and EOM are normal.  Neck: No JVD present. No tracheal deviation present. No thyromegaly present.  Cardiovascular: Normal rate, regular rhythm and normal heart sounds.  Exam reveals no gallop and no friction rub.   No murmur heard. Pulmonary/Chest: Effort normal and breath sounds normal. No respiratory distress. He has no wheezes. He has no rales. He exhibits no tenderness.  Abdominal: Soft. Bowel sounds are normal. He exhibits no distension and no mass. There is no tenderness.  Musculoskeletal: He exhibits edema (trace bilaterally) and tenderness.       Right shoulder: He exhibits decreased range of motion, tenderness and deformity.  Using walker. Unstable gait.  Lymphadenopathy:    He has no cervical adenopathy.  Neurological: He is alert and oriented to person, place, and time. He has normal reflexes. No cranial nerve deficit. Coordination normal.  08/15/14 MMSE 27/30 09/06/2014  PNCV showed severe bilateral median nerve neuropathy consistent with severe carpal tunnel  syndrome.  Skin: There is erythema (both shins). No  pallor.  Chronic venous stasis changes of both anterior legs.  Multiple open sores of both lower legs. Purple discoloration and scarring of the right medial calf area.  Psychiatric: He has a normal mood and affect. His behavior is normal. Judgment and thought content normal.    Labs reviewed: Basic Metabolic Panel:  Recent Labs  12/30/15 1555 01/28/16 1032 06/10/16 1535  NA 136 140 144  K 4.5 4.1 4.2  CL 90* 97* 99  CO2 28 33* 32*  GLUCOSE 219* 173* 116*  BUN 36* 22 16  CREATININE 1.59* 1.16 1.25  CALCIUM 10.0 9.5 9.2  TSH  --   --  2.66   Liver Function Tests:  Recent Labs  12/02/15 1603 12/10/15 1049 06/10/16 1535  AST '25 23 30  ' ALT '16 18 18  ' ALKPHOS 53 56 45  BILITOT 0.3 0.5 0.4  PROT 7.0 7.1 7.2  ALBUMIN 3.7 3.8 3.7   No results for input(s): LIPASE, AMYLASE in the last 8760 hours. No results for input(s): AMMONIA in the last 8760 hours. CBC:  Recent Labs  12/02/15 1603 01/28/16 1032 06/10/16 1535  WBC 7.5 6.8 9.0  NEUTROABS 3,750 3,264 4,770  HGB 12.4* 13.1* 12.7*  HCT 37.8* 39.6 38.7  MCV 88.1 87.6 88.8  PLT 229 209 258   Lipid Panel: No results for input(s): CHOL, HDL, LDLCALC, TRIG, CHOLHDL, LDLDIRECT in the last 8760 hours. TSH:  Recent Labs  06/10/16 1535  TSH 2.66   A1C: Lab Results  Component Value Date   HGBA1C 6.9 (H) 06/10/2016     Assessment/Plan 1. Chronic pain syndrome -has been prescribed methadone, would like to titrate off medication however due to chronic pain in shoulders and back unsure if this is realistic.  - Ambulatory referral to Pain Clinic -pt also taking tylenol 2000 mg BID, to decrease tylenol to 1300 mg BID and not to exceed the MAX dosing of 1300 mg at one time. 3000 mg of tylenol in 24 hours.  - acetaminophen (TYLENOL) 650 MG CR tablet; Take 2 tablets (1,300 mg total) by mouth 2 (two) times daily.  2. Morbid obesity due to excess calories  ([place]) -attempting to cut back on calories for weight loss.   3. Hereditary and idiopathic peripheral neuropathy Improving on gabapentin.   4. Type 2 diabetes mellitus with diabetic neuropathy, with long-term current use of insulin (HCC) -cont lanuts 22 units daily with dietary modifications.  - gabapentin (NEURONTIN) 100 MG capsule; Take 2 capsules (200 mg total) by mouth 3 (three) times daily.  Dispense: 90 capsule; Refill: 3 - Hemoglobin A1c  5. Edema, unspecified type Improved.   6. Essential hypertension -not controlled, will increase lisinopril to 40 mg daily, to cont metoprolol as well -DASH diet.  - lisinopril (PRINIVIL,ZESTRIL) 40 MG tablet; Take 1 tablet (40 mg total) by mouth daily.  Dispense: 30 tablet; Refill: 2 - CMP with eGFR - CBC with Differential/Platelets  7. CKD (chronic kidney disease) stage 3, GFR 30-59 ml/min -Encourage proper hydration and to avoid NSAIDS (Aleve, Advil, Motrin, Ibuprofen)  - CMP with eGFR  8. Hypercholesterolemia -currently on Lipitor. Ate breakfast ~6 hours ago.  - Lipid Panel  9. Neuropathy, median nerve, unspecified laterality - gabapentin helping but not controlled. Will increase to 2 capsules TID at this time.  -gabapentin (NEURONTIN) 100 MG capsule; Take 2 capsules (200 mg total) by mouth 3 (three) times daily.  Dispense: 90 capsule; Refill: 3  10. Gastroesophageal reflux disease with esophagitis Uncontrolled on protonix BID, will  get GI follow up at this time.  - Ambulatory referral to Gastroenterology  11. Chronic pain of both shoulders -with limited ROM to right shoulder, ongoing for 8 years. Pt was previously seeing dr in [facility] then was referred to Dr Erlinda Hong in [place] who recommended PT however he would like another option. Pt reports PT was not effective. Would like GI referral to be done first before another ortho consult is placed.   To follow up in 4 weeks on medication changes.  Carlos American. Harle Battiest  [facility] [phone] 8 am - 5 pm) [phone] (after hours)

## 2016-10-02 LAB — LIPID PANEL
CHOLESTEROL: 217 mg/dL — AB (ref <200)
HDL: 41 mg/dL (ref >40)
LDL Cholesterol: 109 mg/dL — ABNORMAL HIGH (ref <100)
Total CHOL/HDL Ratio: 5.3 Ratio — ABNORMAL HIGH (ref <5.0)
Triglycerides: 336 mg/dL — ABNORMAL HIGH (ref <150)
VLDL: 67 mg/dL — ABNORMAL HIGH (ref <30)

## 2016-10-02 LAB — COMPLETE METABOLIC PANEL WITH GFR
ALT: 33 U/L (ref 9–46)
AST: 44 U/L — ABNORMAL HIGH (ref 10–35)
Albumin: 3.6 g/dL (ref 3.6–5.1)
Alkaline Phosphatase: 45 U/L (ref 40–115)
BUN: 20 mg/dL (ref 7–25)
CO2: 28 mmol/L (ref 20–31)
Calcium: 9.1 mg/dL (ref 8.6–10.3)
Chloride: 98 mmol/L (ref 98–110)
Creat: 1.14 mg/dL (ref 0.70–1.25)
GFR, Est African American: 78 mL/min (ref >=60)
GFR, Est Non African American: 67 mL/min (ref >=60)
Glucose, Bld: 132 mg/dL — ABNORMAL HIGH (ref 65–99)
Potassium: 4.1 mmol/L (ref 3.5–5.3)
Sodium: 139 mmol/L (ref 135–146)
Total Bilirubin: 0.4 mg/dL (ref 0.2–1.2)
Total Protein: 7 g/dL (ref 6.1–8.1)

## 2016-10-02 LAB — HEMOGLOBIN A1C
Hgb A1c MFr Bld: 7.2 % — ABNORMAL HIGH (ref <5.7)
Mean Plasma Glucose: 160 mg/dL

## 2016-10-07 ENCOUNTER — Other Ambulatory Visit: Payer: Self-pay | Admitting: Nurse Practitioner

## 2016-10-07 MED ORDER — DULOXETINE HCL 60 MG PO CPEP: 60.0000 mg | ORAL_CAPSULE | Freq: Two times a day (BID) | ORAL | 0 refills | Status: DC

## 2016-10-07 NOTE — Telephone Encounter (Signed)
Patient requested Rx to be faxed to pharmacy.  

## 2016-10-29 ENCOUNTER — Ambulatory Visit (INDEPENDENT_AMBULATORY_CARE_PROVIDER_SITE_OTHER): Payer: Medicare Other | Admitting: Nurse Practitioner

## 2016-10-29 ENCOUNTER — Other Ambulatory Visit: Payer: Self-pay | Admitting: Internal Medicine

## 2016-10-29 ENCOUNTER — Encounter: Payer: Self-pay | Admitting: Nurse Practitioner

## 2016-10-29 VITALS — BP 138/86 | HR 84 | Temp 98.0°F | Resp 18 | Ht 69.0 in | Wt 369.6 lb

## 2016-10-29 DIAGNOSIS — I10 Essential (primary) hypertension: Secondary | ICD-10-CM | POA: Diagnosis not present

## 2016-10-29 DIAGNOSIS — G894 Chronic pain syndrome: Secondary | ICD-10-CM | POA: Diagnosis not present

## 2016-10-29 DIAGNOSIS — E1140 Type 2 diabetes mellitus with diabetic neuropathy, unspecified: Secondary | ICD-10-CM

## 2016-10-29 DIAGNOSIS — Z794 Long term (current) use of insulin: Secondary | ICD-10-CM | POA: Diagnosis not present

## 2016-10-29 DIAGNOSIS — G5610 Other lesions of median nerve, unspecified upper limb: Secondary | ICD-10-CM

## 2016-10-29 MED ORDER — GABAPENTIN 300 MG PO CAPS: 300.0000 mg | ORAL_CAPSULE | Freq: Three times a day (TID) | ORAL | 2 refills | Status: DC

## 2016-10-29 MED ORDER — ZOSTER VAC RECOMB ADJUVANTED 50 MCG/0.5ML IM SUSR: 0.5000 mL | Freq: Once | INTRAMUSCULAR | 1 refills | Status: AC

## 2016-10-29 NOTE — Patient Instructions (Signed)
Call and make appt with Gastroenterologist   Dr. Gatha Mayer, MD Gastroenterologist in [place], [facility] Address: [address], [place], [place] [address]  Phone: [phone]   Increase Gabapentin to 300 mg by mouth three times daily for neuropathy  New tablet will be a 300 mg tablet

## 2016-10-29 NOTE — Progress Notes (Signed)
Careteam: Patient Care Team: [name], NP as PCP - General (Geriatric Medicine)  Advanced Directive information Does Patient Have a Medical Advance Directive?: No  Allergies  Allergen Reactions  . Adhesive [Tape] Other (See Comments)    Unknown paper tape ok to use   . Feldene [Piroxicam] Other (See Comments)    unknown  . Latex Other (See Comments)    Rash and itching    Chief Complaint  Patient presents with  . Follow-up    Pt is being seen for a 1 month follow up after medication change. Pt reports that neuropathy is not much better, he is still having a lot of pain.      HPI: Patient is a 66 y.o. male seen in the office today due to medication chanages.  At last visit lisinopril was increase to 40 mg daily, does not take blood pressure at home but never felt like it was an issue.   Neuropathy still bad- gabapentin was increased to 200 mg by mouth TID and at first this was helping but now not so much.  Needing it about every 4 hours but stretches it out to make it work.  conts to take duloxetine 60 mg twice daily Was scheduled to have carpel tunnel surgery but could not afford to have this done. Thinking about rescheduling this now but money still an issue  Did decrease tylenol to 1300 mg twice daily helping joints better before but was taking more than recommended dose.   Referrals placed for pain clinic and GI.  Pt needs to call to make GI appt. Records are under review for pain clinic.   Review of Systems:  Review of Systems  Constitutional: Negative for chills, fever and weight loss.  HENT: Negative for tinnitus.   Respiratory: Negative for cough, sputum production and shortness of breath.   Cardiovascular: Negative for chest pain, palpitations and leg swelling.  Gastrointestinal: Positive for constipation (controlled on medication. ) and heartburn. Negative for abdominal pain and diarrhea.  Genitourinary: Negative for dysuria, frequency and  urgency.  Musculoskeletal: Positive for back pain and joint pain (shoulder pain). Negative for falls and myalgias.  Skin: Negative.   Neurological: Positive for tingling and sensory change. Negative for dizziness and headaches.  Psychiatric/Behavioral: Negative for depression and memory loss. The patient has insomnia (controlled on medication).     Past Medical History:  Diagnosis Date  . Anemia   . Angina   . Anxiety   . Arthritis   . Atrial fibrillation with RVR ([place]) 01/24/2013  . Blood transfusion    last 10' 14- "GI bleed"  . CHF (congestive heart failure) ([place])   . CKD (chronic kidney disease) stage 3, GFR 30-59 ml/min 01/08/2016  . Depression   . Diabetes mellitus ([place]) 09/18/2011  . Diverticulosis   . DJD (degenerative joint disease)   . DM type 2, uncontrolled, with neuropathy ([place]) 12/26/2013  . Edema extremities    bilateral lower extremities-"weepy areas due to fluid retention"  . Fatty liver   . Fundic gland polyps of stomach, benign   . Headache(784.0)   . Hepatitis B   . History of alcohol abuse    last drink in 1993  . Hypertension   . Neuromuscular disorder ([place])   . 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 Obesity   . Obesity, morbid ([place]) 07/30/2015  . Pneumonia    not at present time  . Renal insufficiency   . Shortness of breath   .  Sleep apnea, obstructive    does where bipap. 06-04-14 "doesn't use much now, no mask/tubing now.   Past Surgical History:  Procedure Laterality Date  . ABDOMINAL SURGERY     [phone]  . BACK SURGERY     x 8 -,multiple fusions(cervical to lumbar)  . COLONOSCOPY    . COLONOSCOPY WITH PROPOFOL N/A 06/14/2014   Procedure: COLONOSCOPY WITH PROPOFOL;  Surgeon: Gatha Mayer, MD;  Location: [facility];  Service: Endoscopy;  Laterality: N/A;  . DIAGNOSTIC LAPAROSCOPY    . ESOPHAGOGASTRODUODENOSCOPY N/A 01/03/2013   Procedure: ESOPHAGOGASTRODUODENOSCOPY (EGD);  Surgeon: Arta Silence, MD;  Location: [facility];   Service: Endoscopy;  Laterality: N/A;  . ESOPHAGOGASTRODUODENOSCOPY N/A 01/03/2013   Procedure: ESOPHAGOGASTRODUODENOSCOPY (EGD);  Surgeon: Arta Silence, MD;  Location: [facility];  Service: Endoscopy;  Laterality: N/A;  . ESOPHAGOGASTRODUODENOSCOPY N/A 01/23/2013   Procedure: ESOPHAGOGASTRODUODENOSCOPY (EGD);  Surgeon: Lear Ng, MD;  Location: [facility] ENDOSCOPY;  Service: Endoscopy;  Laterality: N/A;  . ESOPHAGOGASTRODUODENOSCOPY Left 01/27/2013   Procedure: ESOPHAGOGASTRODUODENOSCOPY (EGD);  Surgeon: Lear Ng, MD;  Location: [place];  Service: Endoscopy;  Laterality: Left;  . ESOPHAGOGASTRODUODENOSCOPY N/A 06/14/2014   Procedure: ESOPHAGOGASTRODUODENOSCOPY (EGD);  Surgeon: Gatha Mayer, MD;  Location: [facility];  Service: Endoscopy;  Laterality: N/A;  . ESOPHAGOSCOPY N/A 01/01/2013   Procedure: ESOPHAGOSCOPY;  Surgeon: Jeryl Columbia, MD;  Location: [facility];  Service: Endoscopy;  Laterality: N/A;  . GASTRIC BYPASS  1977   Reversed in 1992 and revision 1994  . HYDROCELE EXCISION  1996   x 2   . KNEE SURGERY Left 2004   Social History:   reports that he has never smoked. He has never used smokeless tobacco. He reports that he does not drink alcohol or use drugs.  Family History  Problem Relation Age of Onset  . Heart disease Father   . Skin cancer Father   . COPD Father   . Hypertension Father   . Stroke Father   . Arthritis Mother   . Osteoporosis Mother   . Diabetes Daughter   . Depression Son   . Heart disease Maternal Uncle   . High blood pressure Sister   . High blood pressure Sister   . High blood pressure Son   . Depression Son   . Arthritis Sister   . Arthritis Sister   . Cancer Maternal Grandmother   . Heart disease Maternal Uncle   . Heart disease Paternal Uncle     Medications: Patient's Medications  New Prescriptions   No medications on file  Previous Medications   ACETAMINOPHEN (TYLENOL) 650 MG CR TABLET    Take 2 tablets (1,300 mg  total) by mouth 2 (two) times daily.   ATORVASTATIN (LIPITOR) 40 MG TABLET    take 1 tablet once daily   DULOXETINE (CYMBALTA) 60 MG CAPSULE    Take 1 capsule (60 mg total) by mouth 2 (two) times daily.   GABAPENTIN (NEURONTIN) 100 MG CAPSULE    Take 2 capsules (200 mg total) by mouth 3 (three) times daily.   GLUCOSE BLOOD (ACCU-CHEK AVIVA PLUS) TEST STRIP    1 each by Other route See admin instructions. Check blood sugar twice daily   LANTUS SOLOSTAR 100 UNIT/ML SOLOSTAR PEN    inject 22-24 UNITS EVERY MORNING   LISINOPRIL (PRINIVIL,ZESTRIL) 40 MG TABLET    Take 1 tablet (40 mg total) by mouth daily.   METFORMIN (GLUCOPHAGE) 1000 MG TABLET    Take 1 tablet (1,000 mg total) by  mouth 2 (two) times daily with a meal.   METHADONE (DOLOPHINE) 10 MG TABLET    Take 1 or 2 tablets by mouth 3 times daily to control pain   METOLAZONE (ZAROXOLYN) 2.5 MG TABLET    One daily to help reduce edema   METOPROLOL TARTRATE (LOPRESSOR) 50 MG TABLET    Take 1 tablet (50 mg total) by mouth 2 (two) times daily.   MULTIPLE VITAMIN (MULITIVITAMIN WITH MINERALS) TABS    Take 1 tablet by mouth daily.   NALOXEGOL OXALATE (MOVANTIK) 25 MG TABS TABLET    Take 1 tablet (25 mg total) by mouth daily.   NYSTATIN CREAM (MYCOSTATIN)    Apply to yeast dermatitis in groin daily   PANTOPRAZOLE (PROTONIX) 40 MG TABLET    TAKE 1 TABLET(40 MG) BY MOUTH TWICE DAILY   POLYETHYLENE GLYCOL (MIRALAX / GLYCOLAX) PACKET    Take 17 g by mouth daily as needed for mild constipation or moderate constipation.   TORSEMIDE (DEMADEX) 100 MG TABLET    Take one tablet by mouth twice daily   TRAZODONE (DESYREL) 100 MG TABLET    TAKE 3 TABLETS BY MOUTH AT BEDTIME FOR REST   TRIAMCINOLONE CREAM (KENALOG) 0.1 %    Apply sparingly to rash up to twice daily  Modified Medications   Modified Medication Previous Medication   ZOSTER VAC RECOMB ADJUVANTED (SHINGRIX) INJECTION Zoster Vac Recomb Adjuvanted (SHINGRIX) injection      Inject 0.5 mLs into the muscle  once.    Inject 0.5 mLs into the muscle once.  Discontinued Medications   No medications on file     Physical Exam:  Vitals:   10/29/16 1515  BP: 138/86  Pulse: 84  Resp: 18  Temp: 98 F (36.7 C)  TempSrc: Oral  SpO2: 95%  Weight: (!) 369 lb 9.6 oz (167.6 kg)  Height: 5\' 9"  (1.753 m)   Body mass index is 54.58 kg/m.  Physical Exam  Constitutional: He is oriented to person, place, and time. He appears well-developed and well-nourished. No distress.  Morbid obesity  HENT:  Right Ear: External ear normal.  Left Ear: External ear normal.  Nose: Nose normal.  Mouth/Throat: Oropharynx is clear and moist. No oropharyngeal exudate.  Eyes: Pupils are equal, round, and reactive to light. Conjunctivae and EOM are normal.  Neck: No JVD present. No tracheal deviation present. No thyromegaly present.  Cardiovascular: Normal rate, regular rhythm and normal heart sounds.  Exam reveals no gallop and no friction rub.   No murmur heard. Pulmonary/Chest: Effort normal and breath sounds normal. No respiratory distress. He has no wheezes. He has no rales. He exhibits no tenderness.  Abdominal: Soft. Bowel sounds are normal. He exhibits no distension and no mass. There is no tenderness.  Musculoskeletal: He exhibits edema (trace bilaterally) and tenderness.       Right shoulder: He exhibits decreased range of motion, tenderness and deformity.  Using walker. Unstable gait.  Lymphadenopathy:    He has no cervical adenopathy.  Neurological: He is alert and oriented to person, place, and time. He has normal reflexes. No cranial nerve deficit. Coordination normal.  08/15/14 MMSE 27/30 09/06/2014  PNCV showed severe bilateral median nerve neuropathy consistent with severe carpal tunnel syndrome.  Skin: There is erythema (both shins). No pallor.  Chronic venous stasis changes of both anterior legs.  Multiple open sores of both lower legs. Purple discoloration and scarring of the right medial calf  area.  Psychiatric: He has a normal mood and  affect. His behavior is normal. Judgment and thought content normal.    Labs reviewed: Basic Metabolic Panel:  Recent Labs  01/28/16 1032 06/10/16 1535 10/01/16 1504  NA 140 144 139  K 4.1 4.2 4.1  CL 97* 99 98  CO2 33* 32* 28  GLUCOSE 173* 116* 132*  BUN 22 16 20   CREATININE 1.16 1.25 1.14  CALCIUM 9.5 9.2 9.1  TSH  --  2.66  --    Liver Function Tests:  Recent Labs  12/10/15 1049 06/10/16 1535 10/01/16 1504  AST 23 30 44*  ALT 18 18 33  ALKPHOS 56 45 45  BILITOT 0.5 0.4 0.4  PROT 7.1 7.2 7.0  ALBUMIN 3.8 3.7 3.6   No results for input(s): LIPASE, AMYLASE in the last 8760 hours. No results for input(s): AMMONIA in the last 8760 hours. CBC:  Recent Labs  01/28/16 1032 06/10/16 1535 10/01/16 1504  WBC 6.8 9.0 7.8  NEUTROABS 3,264 4,770 4,758  HGB 13.1* 12.7* 13.5  HCT 39.6 38.7 40.6  MCV 87.6 88.8 90.0  PLT 209 258 235   Lipid Panel:  Recent Labs  10/01/16 1504  CHOL 217*  HDL 41  LDLCALC 109*  TRIG 336*  CHOLHDL 5.3*   TSH:  Recent Labs  06/10/16 1535  TSH 2.66   A1C: Lab Results  Component Value Date   HGBA1C 7.2 (H) 10/01/2016     Assessment/Plan 1. Type 2 diabetes mellitus with diabetic neuropathy, with long-term current use of insulin (HCC) A1c stable, conts on metformin for diabetic control.  -neuropathy is legs not has bad as in hands.  - gabapentin (NEURONTIN) 300 MG capsule; Take 1 capsule (300 mg total) by mouth 3 (three) times daily.  Dispense: 90 capsule; Refill: 2  2. Neuropathy, median nerve, unspecified laterality -will increase gabapentin at this time to help with pain.  - gabapentin (NEURONTIN) 300 MG capsule; Take 1 capsule (300 mg total) by mouth 3 (three) times daily.  Dispense: 90 capsule; Refill: 2  3. Essential hypertension improved, will cont current regimen  4. Chronic pain syndrome Referral placed and pending for pain clinic, has cut back on tylenol to  appropriate dose. Will refill methadone while awaiting on appt at pain clinic.    Next appt: nov for physical with [facility], sooner if needed Ineze Serrao K. Harle Battiest  [facility] [phone] 8 am - 5 pm) [phone] (after hours)

## 2016-10-30 ENCOUNTER — Other Ambulatory Visit: Payer: Self-pay | Admitting: *Deleted

## 2016-10-30 DIAGNOSIS — G5610 Other lesions of median nerve, unspecified upper limb: Secondary | ICD-10-CM

## 2016-10-30 MED ORDER — METHADONE HCL 10 MG PO TABS: ORAL_TABLET | ORAL | 0 refills | Status: DC

## 2016-10-30 NOTE — Telephone Encounter (Signed)
Patient Requested and will pick up [place]CCS[place] Database Checked.  

## 2016-11-03 ENCOUNTER — Telehealth: Payer: Self-pay

## 2016-11-03 ENCOUNTER — Other Ambulatory Visit: Payer: Self-pay | Admitting: Internal Medicine

## 2016-11-03 MED ORDER — TORSEMIDE 100 MG PO TABS: 100.0000 mg | ORAL_TABLET | Freq: Two times a day (BID) | ORAL | 2 refills | Status: DC

## 2016-11-03 NOTE — Telephone Encounter (Signed)
Patient called requesting refill on fluid pill, 90 day supply to be sent to [facility] on [address].  Patient asked that I remove [facility] from his profile.  Patient also explained that he has [facility] listed as a current pharmacy, only refills Methadone at this location

## 2016-11-04 ENCOUNTER — Other Ambulatory Visit: Payer: Self-pay | Admitting: Internal Medicine

## 2016-11-09 ENCOUNTER — Telehealth: Payer: Self-pay | Admitting: *Deleted

## 2016-11-09 ENCOUNTER — Other Ambulatory Visit: Payer: Self-pay | Admitting: Internal Medicine

## 2016-11-09 NOTE — Telephone Encounter (Signed)
Received fax from [facility] requesting Prior authorization for patient's Methadone 10mg .  Initiated Prior Authorization through [facility]. Will be 48-72 hours for determination. [name] assisted with some of the questions.  [id number] Patient ID: [id number]

## 2016-11-09 NOTE — Telephone Encounter (Signed)
Received fax from [facility] stating Methadone is APPROVED through 03/15/2017 Faxed approval to pharmacy.

## 2016-11-09 NOTE — Telephone Encounter (Signed)
Patient called to state that he had a problem with his Methadone this weekend and we [facility]) was the main problem. Patient felt as if we were in control of his PA initiation.   I tried to explain the PA process to the patient,  1.) RX is written 2.) RX is taken to the pharmacy and the pharmacy will let us know if a Prior Authorization is required (the provider's office does not know a PA is required unless the pharmacy advises)  3.) PA initiated 4.) The Insurance company has 48-72 hours to respond to PA 5.) If PA approved notification is given to the pharmacy with the approval dates. 6.) Patient can go back to the pharmacy and pick-up RX 7.) If PA denied, information is sent to provider for review  Patient constantly interrupted me as I tried to explain this process and states the pharmacy states that we are the problem and the insurance company states we are the problem.  Patient was not receptive to any information that I was providing to him, patient constantly blamed [facility] and states he never had a problem before, patient then abruptly ended the call.   Patient's PCP was verbally informed of this interaction, [name] inquired about patient's Pain Medicine referral (still pending review)

## 2016-11-10 MED ORDER — TRAZODONE HCL 100 MG PO TABS: ORAL_TABLET | ORAL | 0 refills | Status: DC

## 2016-11-10 MED ORDER — DULOXETINE HCL 60 MG PO CPEP: 60.0000 mg | ORAL_CAPSULE | Freq: Two times a day (BID) | ORAL | 0 refills | Status: DC

## 2016-11-10 NOTE — Telephone Encounter (Signed)
Refill request came in from [facility] on [address], however patient would like these refills sent to [facility] on [address].

## 2016-12-07 ENCOUNTER — Other Ambulatory Visit: Payer: Self-pay | Admitting: *Deleted

## 2016-12-07 MED ORDER — METFORMIN HCL 1000 MG PO TABS: ORAL_TABLET | ORAL | 1 refills | Status: DC

## 2016-12-07 NOTE — Telephone Encounter (Signed)
Patient requested Rx to be sent to [facility]

## 2016-12-08 ENCOUNTER — Other Ambulatory Visit: Payer: Self-pay | Admitting: Nurse Practitioner

## 2016-12-09 ENCOUNTER — Other Ambulatory Visit: Payer: Self-pay | Admitting: Nurse Practitioner

## 2016-12-10 ENCOUNTER — Other Ambulatory Visit: Payer: Self-pay | Admitting: *Deleted

## 2016-12-10 DIAGNOSIS — G5610 Other lesions of median nerve, unspecified upper limb: Secondary | ICD-10-CM

## 2016-12-10 MED ORDER — METHADONE HCL 10 MG PO TABS: ORAL_TABLET | ORAL | 0 refills | Status: DC

## 2016-12-10 MED ORDER — TRAZODONE HCL 100 MG PO TABS: ORAL_TABLET | ORAL | 0 refills | Status: DC

## 2016-12-10 NOTE — Telephone Encounter (Signed)
Patient requested Rx and will pick up [facility]

## 2016-12-14 ENCOUNTER — Telehealth: Payer: Self-pay

## 2016-12-14 NOTE — Telephone Encounter (Signed)
I called patient at [name] request to let him know that this office will not be prescribing any more controlled substances to him since he has a scheduled appointment with pain management on January 04, 2017 with Dr. Mirna Mires.    Left a message for patient to call the office.

## 2016-12-15 NOTE — Telephone Encounter (Signed)
I spoke with patient and he verbalized understanding.

## 2016-12-17 ENCOUNTER — Encounter: Payer: Self-pay | Admitting: Nurse Practitioner

## 2016-12-31 ENCOUNTER — Other Ambulatory Visit: Payer: Self-pay | Admitting: Nurse Practitioner

## 2016-12-31 DIAGNOSIS — I10 Essential (primary) hypertension: Secondary | ICD-10-CM

## 2017-01-04 ENCOUNTER — Telehealth: Payer: Self-pay | Admitting: Nurse Practitioner

## 2017-01-04 DIAGNOSIS — G5610 Other lesions of median nerve, unspecified upper limb: Secondary | ICD-10-CM

## 2017-01-04 DIAGNOSIS — G894 Chronic pain syndrome: Secondary | ICD-10-CM

## 2017-01-04 NOTE — Telephone Encounter (Signed)
Fwd to [employer] as FYI, we instructed pt that we would not be prescribing him anymore methadone and that he needed to keep this appt.

## 2017-01-04 NOTE — Telephone Encounter (Signed)
FYI,  I received a call from [facility] from [facility] who stated patient showed up for his appointment today but did not have paperwork completed as he was informed to do and when they told him he would have to reschedule his appointment patient became very rude & belligerent and refused to reschedule. I was informed that the office administrator has marked his chart as not eligible to reschedule due to his belligerence before he left.

## 2017-01-05 ENCOUNTER — Other Ambulatory Visit: Payer: Self-pay | Admitting: Nurse Practitioner

## 2017-01-05 NOTE — Telephone Encounter (Signed)
Please enter new referral as the other one has already been completed.  Thank you

## 2017-01-05 NOTE — Telephone Encounter (Signed)
Referral Placed 

## 2017-01-05 NOTE — Telephone Encounter (Signed)
Patient called and stated that he went to his Pain Clinic appointment yesterday. Stated that he was 3 pages short out of 8 pages of filling out his new patient packet and they made him Reschedule. He stated that they were very rude and short with him. Patient stated that he had to drive 80 mile round trip to go to that appointment.   Patient wants to go to [facility] since he is living in [place] now. Patient would like for you to place a referral so he can get his medication. Please Advise.

## 2017-01-05 NOTE — Telephone Encounter (Signed)
We can place this referral. As stated before we will no longer be providing this medication for him.

## 2017-01-06 ENCOUNTER — Telehealth: Payer: Self-pay | Admitting: *Deleted

## 2017-01-06 NOTE — Telephone Encounter (Signed)
Pt needs to go to his dentist for this or come in for an appt. I can not prescribe an antibiotic without seeing him.

## 2017-01-06 NOTE — Telephone Encounter (Signed)
Patient called and stated that he has an abscess tooth and wants to know if you will prescribe him an antibiotic for it. Patient stated that he does not want to come in because it costs too much and stated that the drive is long. Patient stated he knows it is an abscess due to taking Methadone for so long. Would like antibiotic. Please Advise.

## 2017-01-07 ENCOUNTER — Other Ambulatory Visit: Payer: Self-pay | Admitting: Nurse Practitioner

## 2017-01-07 NOTE — Telephone Encounter (Signed)
Patient stated that he will have to call us back because he already has an appointment today and don't know how long it will take.

## 2017-01-12 ENCOUNTER — Telehealth: Payer: Self-pay | Admitting: *Deleted

## 2017-01-12 NOTE — Telephone Encounter (Signed)
Reviewed with Dr Mariea Clonts, if he will like we can  give a 1 time refill of 90 tablets, this will be it. Will also start the titration and change the Rx to Methadone 10 mg 1 tablet by mouth every 8 hours as needed for pain. #90.

## 2017-01-12 NOTE — Telephone Encounter (Signed)
Patient notified had to leave detailed message. Rx due 01/14/2017. Forwarded message to [facility].

## 2017-01-12 NOTE — Telephone Encounter (Signed)
Patient called and stated that he can't see the pain clinic at [facility] in [facility] until he pays is past due bill of $75.00. Stated that he does not get paid until 01/27/2017 and then an appointment wouldn't be available until the end of November/December. Patient wants to know if you will fill his pain medication until he is able to be seen. Stated that he cannot come off of the Methadone [place]. He stated that he had tried this in the past with no good result. Please Advise.

## 2017-01-14 MED ORDER — METHADONE HCL 10 MG PO TABS: 10.0000 mg | ORAL_TABLET | Freq: Three times a day (TID) | ORAL | 0 refills | Status: DC | PRN

## 2017-01-20 ENCOUNTER — Ambulatory Visit
Admission: RE | Admit: 2017-01-20 | Discharge: 2017-01-20 | Disposition: A | Discharge status: Home / Self Care | Payer: Medicare Other | Source: Ambulatory Visit | Attending: Nurse Practitioner | Admitting: Nurse Practitioner

## 2017-01-20 ENCOUNTER — Ambulatory Visit (INDEPENDENT_AMBULATORY_CARE_PROVIDER_SITE_OTHER): Payer: Medicare Other | Admitting: Nurse Practitioner

## 2017-01-20 ENCOUNTER — Encounter: Payer: Self-pay | Admitting: Nurse Practitioner

## 2017-01-20 VITALS — BP 132/78 | HR 72 | Temp 98.1°F | Resp 18 | Ht 69.0 in | Wt 357.8 lb

## 2017-01-20 DIAGNOSIS — M4802 Spinal stenosis, cervical region: Secondary | ICD-10-CM | POA: Diagnosis not present

## 2017-01-20 DIAGNOSIS — B372 Candidiasis of skin and nail: Secondary | ICD-10-CM

## 2017-01-20 DIAGNOSIS — G5610 Other lesions of median nerve, unspecified upper limb: Secondary | ICD-10-CM | POA: Diagnosis not present

## 2017-01-20 DIAGNOSIS — M5090 Cervical disc disorder, unspecified, unspecified cervical region: Secondary | ICD-10-CM

## 2017-01-20 MED ORDER — NYSTATIN 100000 UNIT/GM EX CREA: TOPICAL_CREAM | CUTANEOUS | 2 refills | Status: DC

## 2017-01-20 MED ORDER — TRIAMCINOLONE ACETONIDE 0.1 % EX CREA: TOPICAL_CREAM | CUTANEOUS | 2 refills | Status: DC

## 2017-01-20 NOTE — Progress Notes (Signed)
Careteam: Patient Care Team: [name], NP as PCP - General (Geriatric Medicine)  Advanced Directive information Does Patient Have a Medical Advance Directive?: No, Would patient like information on creating a medical advance directive?: No - Patient declined  Allergies  Allergen Reactions  . Adhesive [Tape] Other (See Comments)    Unknown paper tape ok to use   . Feldene [Piroxicam] Other (See Comments)    unknown  . Latex Other (See Comments)    Rash and itching    Chief Complaint  Patient presents with  . Acute Visit    Pt is being seen to discuss referrals to ortho for carpal tunnel and neuro for numbness in hands, arms, and legs     HPI: Patient is a 66 y.o. male seen in the office today due to referral. Ready for referral for ortho due to carpal tunnel. Had been diagnosised in the past but could not afford co-pay for surgery. Now he feels like he does not have a choice due to the progression of the disease. Now having increase pain in neck shoulders and arms.  Limited ROM to right arm due to rotator cuff. Attempted therapy but was not very successful. Also has had progressive numbess and tingling down bilateral arms.   Yeast rash under breast that goes and comes. Uses creams to help this controlled.   Had injection in shoulders and knees in the past which was helpful  Review of Systems:  Review of Systems  Constitutional: Negative for chills, fever and weight loss.  HENT: Negative for tinnitus.   Respiratory: Negative for cough, sputum production and shortness of breath.   Cardiovascular: Negative for chest pain, palpitations and leg swelling.  Gastrointestinal: Positive for constipation (controlled on medication. ) and heartburn. Negative for abdominal pain and diarrhea.  Genitourinary: Negative for dysuria, frequency and urgency.  Musculoskeletal: Positive for back pain and joint pain (shoulder pain). Negative for falls, myalgias and neck pain.  Skin:  Positive for rash.  Neurological: Positive for tingling and sensory change. Negative for dizziness and headaches.  Psychiatric/Behavioral: Negative for depression and memory loss. The patient has insomnia (controlled on medication).     Past Medical History:  Diagnosis Date  . Anemia   . Angina   . Anxiety   . Arthritis   . Atrial fibrillation with RVR ([place]) 01/24/2013  . Blood transfusion    last 10' 14- "GI bleed"  . CHF (congestive heart failure) ([place])   . CKD (chronic kidney disease) stage 3, GFR 30-59 ml/min ([facility]) 01/08/2016  . Depression   . Diabetes mellitus ([place]) 09/18/2011  . Diverticulosis   . DJD (degenerative joint disease)   . DM type 2, uncontrolled, with neuropathy ([place]) 12/26/2013  . Edema extremities    bilateral lower extremities-"weepy areas due to fluid retention"  . Fatty liver   . Fundic gland polyps of stomach, benign   . Headache(784.0)   . Hepatitis B   . History of alcohol abuse    last drink in 1993  . Hypertension   . Neuromuscular disorder ([place])   . 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 Obesity   . Obesity, morbid ([place]) 07/30/2015  . Pneumonia    not at present time  . Renal insufficiency   . Shortness of breath   . Sleep apnea, obstructive    does where bipap. 06-04-14 "doesn't use much now, no mask/tubing now.   Past Surgical History:  Procedure Laterality Date  . ABDOMINAL  SURGERY     [phone]  . BACK SURGERY     x 8 -,multiple fusions(cervical to lumbar)  . COLONOSCOPY    . DIAGNOSTIC LAPAROSCOPY    . GASTRIC BYPASS  1977   Reversed in 1992 and revision 1994  . HYDROCELE EXCISION  1996   x 2   . KNEE SURGERY Left 2004   Social History:   reports that  has never smoked. he has never used smokeless tobacco. He reports that he does not drink alcohol or use drugs.  Family History  Problem Relation Age of Onset  . Heart disease Father   . Skin cancer Father   . COPD Father   . Hypertension Father   . Stroke  Father   . Arthritis Mother   . Osteoporosis Mother   . Diabetes Daughter   . Depression Son   . Heart disease Maternal Uncle   . High blood pressure Sister   . High blood pressure Sister   . High blood pressure Son   . Depression Son   . Arthritis Sister   . Arthritis Sister   . Cancer Maternal Grandmother   . Heart disease Maternal Uncle   . Heart disease Paternal Uncle     Medications:   Medication List        Accurate as of 01/20/17  2:30 PM. Always use your most recent med list.          ACCU-CHEK AVIVA PLUS test strip Generic drug:  glucose blood   acetaminophen 650 MG CR tablet Commonly known as:  TYLENOL Take 2 tablets (1,300 mg total) by mouth 2 (two) times daily.   atorvastatin 40 MG tablet Commonly known as:  LIPITOR take 1 tablet once daily   DULoxetine 60 MG capsule Commonly known as:  CYMBALTA TAKE 1 CAPSULE(60 MG) BY MOUTH TWICE DAILY   gabapentin 300 MG capsule Commonly known as:  NEURONTIN Take 1 capsule (300 mg total) by mouth 3 (three) times daily.   LANTUS SOLOSTAR 100 UNIT/ML Solostar Pen Generic drug:  Insulin Glargine inject 22-24 UNITS EVERY MORNING   lisinopril 40 MG tablet Commonly known as:  PRINIVIL,ZESTRIL TAKE 1 TABLET(40 MG) BY MOUTH DAILY   metFORMIN 1000 MG tablet Commonly known as:  GLUCOPHAGE Take one tablet by mouth twice daily with a meal to control blood sugar   methadone 10 MG tablet Commonly known as:  DOLOPHINE Take 1 tablet (10 mg total) by mouth every 8 (eight) hours as needed.   metolazone 2.5 MG tablet Commonly known as:  ZAROXOLYN One daily to help reduce edema   metoprolol tartrate 50 MG tablet Commonly known as:  LOPRESSOR TAKE 1 TABLET(50 MG) BY MOUTH TWICE DAILY   multivitamin with minerals Tabs tablet   naloxegol oxalate 25 MG Tabs tablet Commonly known as:  MOVANTIK Take 1 tablet (25 mg total) by mouth daily.   nystatin cream Commonly known as:  MYCOSTATIN Apply to yeast dermatitis in  groin daily   pantoprazole 40 MG tablet Commonly known as:  PROTONIX TAKE 1 TABLET(40 MG) BY MOUTH TWICE DAILY   polyethylene glycol packet Commonly known as:  MIRALAX / GLYCOLAX   torsemide 100 MG tablet Commonly known as:  DEMADEX Take 1 tablet (100 mg total) by mouth 2 (two) times daily.   traZODone 100 MG tablet Commonly known as:  DESYREL TAKE 3 TABLETS BY MOUTH AT BEDTIME FOR REST   triamcinolone cream 0.1 % Commonly known as:  KENALOG Apply sparingly to rash up  to twice daily        Physical Exam:  There were no vitals filed for this visit. There is no height or weight on file to calculate BMI.  Physical Exam  Constitutional: He is oriented to person, place, and time. He appears well-developed and well-nourished. No distress.  Morbid obesity  HENT:  Right Ear: External ear normal.  Left Ear: External ear normal.  Nose: Nose normal.  Mouth/Throat: Oropharynx is clear and moist. No oropharyngeal exudate.  Eyes: Conjunctivae and EOM are normal. Pupils are equal, round, and reactive to light.  Neck: No JVD present. No tracheal deviation present. No thyromegaly present.  Cardiovascular: Normal rate, regular rhythm and normal heart sounds. Exam reveals no gallop and no friction rub.  No murmur heard. Pulmonary/Chest: Effort normal and breath sounds normal.  Abdominal: Soft. Bowel sounds are normal. There is no tenderness.  Musculoskeletal: He exhibits edema (trace bilaterally) and tenderness.       Right shoulder: He exhibits decreased range of motion, tenderness and deformity.   Scar noted to cervical spine, tenderness on palpation   Lymphadenopathy:    He has no cervical adenopathy.  Neurological: He is alert and oriented to person, place, and time. He has normal reflexes. No cranial nerve deficit. Coordination normal.  08/15/14 MMSE 27/30 09/06/2014  PNCV showed severe bilateral median nerve neuropathy consistent with severe carpal tunnel syndrome. Decrease grip  strength bilaterally and decease sensation   Skin: Rash (noted bilaterally under breast) noted. There is erythema (both shins). No pallor.  Chronic venous stasis changes of both anterior legs.   Psychiatric: He has a normal mood and affect. His behavior is normal. Judgment and thought content normal.    Labs reviewed: Basic Metabolic Panel: Recent Labs    01/28/16 1032 06/10/16 1535 10/01/16 1504  NA 140 144 139  K 4.1 4.2 4.1  CL 97* 99 98  CO2 33* 32* 28  GLUCOSE 173* 116* 132*  BUN 22 16 20   CREATININE 1.16 1.25 1.14  CALCIUM 9.5 9.2 9.1  TSH  --  2.66  --    Liver Function Tests: Recent Labs    06/10/16 1535 10/01/16 1504  AST 30 44*  ALT 18 33  ALKPHOS 45 45  BILITOT 0.4 0.4  PROT 7.2 7.0  ALBUMIN 3.7 3.6   No results for input(s): LIPASE, AMYLASE in the last 8760 hours. No results for input(s): AMMONIA in the last 8760 hours. CBC: Recent Labs    01/28/16 1032 06/10/16 1535 10/01/16 1504  WBC 6.8 9.0 7.8  NEUTROABS 3,264 4,770 4,758  HGB 13.1* 12.7* 13.5  HCT 39.6 38.7 40.6  MCV 87.6 88.8 90.0  PLT 209 258 235   Lipid Panel: Recent Labs    10/01/16 1504  CHOL 217*  HDL 41  LDLCALC 109*  TRIG 336*  CHOLHDL 5.3*   TSH: Recent Labs    06/10/16 1535  TSH 2.66   A1C: Lab Results  Component Value Date   HGBA1C 7.2 (H) 10/01/2016     Assessment/Plan 1. Yeast dermatitis Recurrent yeast infection.  - nystatin cream (MYCOSTATIN); Apply to yeast dermatitis in groin daily  Dispense: 30 g; Refill: 2 - triamcinolone cream (KENALOG) 0.1 %; Apply sparingly to rash up to twice daily  Dispense: 45 g; Refill: 2  2. Median nerve neuropathy, unspecified laterality - known carpel tunnel but did not wish to go through surgery due to cost, now willing to do this. Will rule out cervical spine abnormality with xray but  also will send to ortho at this time. Ambulatory referral to Orthopedics  3. Cervical back pain with evidence of disc disease - past  cervical fusion. Due to progression of numbness and tingling down arm will get xray, may need neurosurgery consult.  DG Cervical Spine Complete; Future,   Next appt: as scheduled.  Carlos American. Harle Battiest  [facility] [phone] 8 am - 5 pm) [phone] (after hours)

## 2017-01-20 NOTE — Patient Instructions (Signed)
To go get xray of back after you leave here.  We will refer to orthopedics Based on xray may refer to neurosurgery as well

## 2017-01-21 ENCOUNTER — Telehealth: Payer: Self-pay | Admitting: *Deleted

## 2017-01-21 DIAGNOSIS — M542 Cervicalgia: Secondary | ICD-10-CM

## 2017-01-21 NOTE — Telephone Encounter (Signed)
Patient called and stated that he received his results from his X-rays and was told he needed an appointment with his Orthopaedic Surgeon. Stated that he called their office and they told him that he needed a referral from PCP.  Patient see's Dr. Arsenio Katz at [facility] in [place] [phone] Fax:[phone]

## 2017-01-22 ENCOUNTER — Telehealth: Payer: Self-pay

## 2017-01-22 MED ORDER — METFORMIN HCL 1000 MG PO TABS: ORAL_TABLET | ORAL | 1 refills | Status: DC

## 2017-01-22 NOTE — Telephone Encounter (Signed)
A fax was received from [facility] on [address] [facility] in [place] that stated patient has not gotten a refill on the lipitor 40 mg.   I called patient to see whether or not he was actually taking the lipitor. Patient stated that he must not have been taking it when I told him the last refill this office made on lipitor was 2016. I then asked if he was getting the lipitor from another provider and he said no.   Patient asked that a refill be sent to [facility] on [address] in [place].   Rx was sent electronically.

## 2017-01-22 NOTE — Telephone Encounter (Signed)
Referral placed. Thank you

## 2017-01-25 ENCOUNTER — Telehealth: Payer: Self-pay | Admitting: *Deleted

## 2017-01-25 NOTE — Telephone Encounter (Signed)
Patient called and left message on clinical intake line stating that yesterday, Sunday around 4:00pm he had a dizzy spell. Stated that he felt like he was standing still and the world was revolving around him and he couldn't put one foot in front of the other one.. Stated that he felt like it was his blood sugar and put a piece of candy in his mouth and it was a little better. Stated that he felt better today but about an hour ago he started feeling dizzy again. Stated that he cannot check his blood sugar because he lost his machine.   I tried to call patient back and had to leave message on voicemail to return call. Awaiting call back.

## 2017-01-26 ENCOUNTER — Ambulatory Visit: Payer: Medicare Other | Admitting: Nurse Practitioner

## 2017-01-26 NOTE — Telephone Encounter (Signed)
Patient scheduled an appointment for 01/26/2017.

## 2017-01-27 ENCOUNTER — Telehealth: Payer: Self-pay | Admitting: *Deleted

## 2017-01-27 DIAGNOSIS — Z794 Long term (current) use of insulin: Principal | ICD-10-CM

## 2017-01-27 DIAGNOSIS — E1140 Type 2 diabetes mellitus with diabetic neuropathy, unspecified: Secondary | ICD-10-CM

## 2017-01-27 DIAGNOSIS — G5610 Other lesions of median nerve, unspecified upper limb: Secondary | ICD-10-CM

## 2017-01-27 MED ORDER — GABAPENTIN 300 MG PO CAPS: ORAL_CAPSULE | ORAL | 0 refills | Status: DC

## 2017-01-27 NOTE — Telephone Encounter (Signed)
Noted. I will discuss this with patient at next OV and lead physician in office.

## 2017-01-27 NOTE — Telephone Encounter (Signed)
Patient called and stated that he needs a refill on his Gabapentin. Patient stated that at his last OV with [name] he discussed increasing his Gabapentin due to the Neuropathy Pain in his arms. Stated that [name] never did say if he could increase so he did it on his own. He stated that he didn't think there would be a problem with it since his Provider didn't tell him to or not when he asked. I asked him how he was taking it and he stated that I should already know this. I told him we have that he should be taking 1 three times daily and he stated exactly but when he is having increased pain he takes 2 three times daily. He stated that [name] should have answered when he asked if they could be increased at his appointment. Please Advise.

## 2017-01-27 NOTE — Telephone Encounter (Signed)
We never discussed this; he needs to consult with [place] prior to increasing medications due to adverse effects. At this time this is okay however after he sees orthopedic surgery and we will need to reduce this back down.

## 2017-01-27 NOTE — Telephone Encounter (Signed)
He stated that he is going to dispute what you said. He stated that even though you are the woman in charge doesn't mean your right. He stated he did mention this to you and he can't help you did not respond to it. Stated that he took you as not responding as he could do what he wanted to.   Faxed Rx to pharmacy.

## 2017-01-29 ENCOUNTER — Ambulatory Visit: Payer: Medicare Other | Admitting: Nurse Practitioner

## 2017-01-29 ENCOUNTER — Ambulatory Visit: Payer: Medicare Other

## 2017-01-29 ENCOUNTER — Other Ambulatory Visit: Payer: Self-pay | Admitting: Nurse Practitioner

## 2017-01-29 DIAGNOSIS — Z794 Long term (current) use of insulin: Principal | ICD-10-CM

## 2017-01-29 DIAGNOSIS — G5610 Other lesions of median nerve, unspecified upper limb: Secondary | ICD-10-CM

## 2017-01-29 DIAGNOSIS — E1140 Type 2 diabetes mellitus with diabetic neuropathy, unspecified: Secondary | ICD-10-CM

## 2017-02-01 ENCOUNTER — Encounter: Payer: Self-pay | Admitting: Nurse Practitioner

## 2017-02-01 ENCOUNTER — Ambulatory Visit: Payer: Medicare Other | Admitting: Nurse Practitioner

## 2017-02-02 ENCOUNTER — Encounter: Payer: Self-pay | Admitting: Nurse Practitioner

## 2017-02-03 ENCOUNTER — Other Ambulatory Visit: Payer: Self-pay | Admitting: Nurse Practitioner

## 2017-02-03 DIAGNOSIS — N529 Male erectile dysfunction, unspecified: Secondary | ICD-10-CM | POA: Insufficient documentation

## 2017-02-03 DIAGNOSIS — H9193 Unspecified hearing loss, bilateral: Secondary | ICD-10-CM | POA: Insufficient documentation

## 2017-02-03 DIAGNOSIS — I10 Essential (primary) hypertension: Secondary | ICD-10-CM | POA: Diagnosis not present

## 2017-02-03 DIAGNOSIS — Z23 Encounter for immunization: Secondary | ICD-10-CM | POA: Diagnosis not present

## 2017-02-09 ENCOUNTER — Other Ambulatory Visit: Payer: Self-pay | Admitting: Nurse Practitioner

## 2017-02-10 DIAGNOSIS — I10 Essential (primary) hypertension: Secondary | ICD-10-CM | POA: Diagnosis not present

## 2017-02-10 DIAGNOSIS — M5090 Cervical disc disorder, unspecified, unspecified cervical region: Secondary | ICD-10-CM | POA: Diagnosis not present

## 2017-02-10 DIAGNOSIS — G894 Chronic pain syndrome: Secondary | ICD-10-CM | POA: Diagnosis not present

## 2017-02-10 DIAGNOSIS — E1169 Type 2 diabetes mellitus with other specified complication: Secondary | ICD-10-CM | POA: Diagnosis not present

## 2017-02-22 ENCOUNTER — Ambulatory Visit: Payer: Self-pay | Admitting: Internal Medicine

## 2017-02-22 ENCOUNTER — Other Ambulatory Visit: Payer: Self-pay | Admitting: *Deleted

## 2017-02-22 DIAGNOSIS — G5610 Other lesions of median nerve, unspecified upper limb: Secondary | ICD-10-CM

## 2017-02-22 DIAGNOSIS — E1140 Type 2 diabetes mellitus with diabetic neuropathy, unspecified: Secondary | ICD-10-CM

## 2017-02-22 DIAGNOSIS — R609 Edema, unspecified: Secondary | ICD-10-CM

## 2017-02-22 DIAGNOSIS — Z794 Long term (current) use of insulin: Secondary | ICD-10-CM

## 2017-02-22 MED ORDER — INSULIN GLARGINE 100 UNIT/ML SOLOSTAR PEN: PEN_INJECTOR | SUBCUTANEOUS | 0 refills | Status: DC

## 2017-02-22 MED ORDER — ATORVASTATIN CALCIUM 40 MG PO TABS: 40.0000 mg | ORAL_TABLET | Freq: Every day | ORAL | 0 refills | Status: DC

## 2017-02-22 MED ORDER — TRAZODONE HCL 100 MG PO TABS: ORAL_TABLET | ORAL | 0 refills | Status: DC

## 2017-02-22 MED ORDER — METOLAZONE 2.5 MG PO TABS: ORAL_TABLET | ORAL | 0 refills | Status: DC

## 2017-02-22 MED ORDER — PANTOPRAZOLE SODIUM 40 MG PO TBEC: DELAYED_RELEASE_TABLET | ORAL | 0 refills | Status: DC

## 2017-02-22 MED ORDER — NALOXEGOL OXALATE 25 MG PO TABS: 25.0000 mg | ORAL_TABLET | Freq: Every day | ORAL | 0 refills | Status: DC

## 2017-02-22 MED ORDER — DULOXETINE HCL 60 MG PO CPEP: ORAL_CAPSULE | ORAL | 0 refills | Status: DC

## 2017-02-22 MED ORDER — METOPROLOL TARTRATE 50 MG PO TABS: ORAL_TABLET | ORAL | 0 refills | Status: DC

## 2017-02-22 MED ORDER — GABAPENTIN 300 MG PO CAPS: ORAL_CAPSULE | ORAL | 0 refills | Status: DC

## 2017-02-22 NOTE — Telephone Encounter (Signed)
Patient has been discharged from practice (discharge letter date 02/02/17). Faxed 30 day Rx's of medications needed to pharmacy. [place].

## 2017-03-05 DIAGNOSIS — I519 Heart disease, unspecified: Secondary | ICD-10-CM | POA: Diagnosis not present

## 2017-03-05 DIAGNOSIS — Z23 Encounter for immunization: Secondary | ICD-10-CM | POA: Diagnosis not present

## 2017-03-05 DIAGNOSIS — Z0000 Encounter for general adult medical examination without abnormal findings: Secondary | ICD-10-CM | POA: Diagnosis not present

## 2017-03-05 DIAGNOSIS — I5189 Other ill-defined heart diseases: Secondary | ICD-10-CM | POA: Diagnosis not present

## 2017-03-05 DIAGNOSIS — M4326 Fusion of spine, lumbar region: Secondary | ICD-10-CM | POA: Diagnosis not present

## 2017-03-20 ENCOUNTER — Other Ambulatory Visit: Payer: Self-pay | Admitting: Nurse Practitioner

## 2017-03-29 ENCOUNTER — Other Ambulatory Visit: Payer: Self-pay | Admitting: Nurse Practitioner

## 2017-04-07 DIAGNOSIS — I5030 Unspecified diastolic (congestive) heart failure: Secondary | ICD-10-CM | POA: Diagnosis not present

## 2017-04-07 DIAGNOSIS — I517 Cardiomegaly: Secondary | ICD-10-CM | POA: Diagnosis not present

## 2017-04-08 DIAGNOSIS — Z981 Arthrodesis status: Secondary | ICD-10-CM | POA: Diagnosis not present

## 2017-04-08 DIAGNOSIS — M5033 Other cervical disc degeneration, cervicothoracic region: Secondary | ICD-10-CM | POA: Diagnosis not present

## 2017-04-08 DIAGNOSIS — M4326 Fusion of spine, lumbar region: Secondary | ICD-10-CM | POA: Diagnosis not present

## 2017-04-08 DIAGNOSIS — M4322 Fusion of spine, cervical region: Secondary | ICD-10-CM | POA: Diagnosis not present

## 2017-04-08 DIAGNOSIS — M5031 Other cervical disc degeneration,  high cervical region: Secondary | ICD-10-CM | POA: Diagnosis not present

## 2017-04-08 DIAGNOSIS — M5134 Other intervertebral disc degeneration, thoracic region: Secondary | ICD-10-CM | POA: Diagnosis not present

## 2017-04-09 DIAGNOSIS — Z5181 Encounter for therapeutic drug level monitoring: Secondary | ICD-10-CM | POA: Diagnosis not present

## 2017-04-09 DIAGNOSIS — M961 Postlaminectomy syndrome, not elsewhere classified: Secondary | ICD-10-CM | POA: Diagnosis not present

## 2017-04-09 DIAGNOSIS — M4326 Fusion of spine, lumbar region: Secondary | ICD-10-CM | POA: Diagnosis not present

## 2017-04-09 DIAGNOSIS — Z79891 Long term (current) use of opiate analgesic: Secondary | ICD-10-CM | POA: Diagnosis not present

## 2017-04-14 ENCOUNTER — Ambulatory Visit: Payer: Self-pay | Admitting: Internal Medicine

## 2017-04-19 ENCOUNTER — Encounter: Payer: Self-pay | Admitting: Nurse Practitioner

## 2017-04-19 DIAGNOSIS — I5030 Unspecified diastolic (congestive) heart failure: Secondary | ICD-10-CM | POA: Diagnosis not present

## 2017-04-19 DIAGNOSIS — I5033 Acute on chronic diastolic (congestive) heart failure: Secondary | ICD-10-CM | POA: Diagnosis not present

## 2017-04-19 DIAGNOSIS — R5383 Other fatigue: Secondary | ICD-10-CM | POA: Diagnosis not present

## 2017-04-19 DIAGNOSIS — G894 Chronic pain syndrome: Secondary | ICD-10-CM | POA: Diagnosis not present

## 2017-04-19 DIAGNOSIS — R140 Abdominal distension (gaseous): Secondary | ICD-10-CM | POA: Diagnosis not present

## 2017-04-19 DIAGNOSIS — K5900 Constipation, unspecified: Secondary | ICD-10-CM | POA: Diagnosis not present

## 2017-04-21 DIAGNOSIS — Z0000 Encounter for general adult medical examination without abnormal findings: Secondary | ICD-10-CM | POA: Diagnosis not present

## 2017-04-21 DIAGNOSIS — E119 Type 2 diabetes mellitus without complications: Secondary | ICD-10-CM | POA: Diagnosis not present

## 2017-04-21 DIAGNOSIS — I10 Essential (primary) hypertension: Secondary | ICD-10-CM | POA: Diagnosis not present

## 2017-04-21 DIAGNOSIS — Z794 Long term (current) use of insulin: Secondary | ICD-10-CM | POA: Diagnosis not present

## 2017-04-21 DIAGNOSIS — L97929 Non-pressure chronic ulcer of unspecified part of left lower leg with unspecified severity: Secondary | ICD-10-CM | POA: Diagnosis not present

## 2017-04-30 DIAGNOSIS — M4326 Fusion of spine, lumbar region: Secondary | ICD-10-CM | POA: Diagnosis not present

## 2017-04-30 DIAGNOSIS — M436 Torticollis: Secondary | ICD-10-CM | POA: Diagnosis not present

## 2017-04-30 DIAGNOSIS — G249 Dystonia, unspecified: Secondary | ICD-10-CM | POA: Diagnosis not present

## 2017-04-30 DIAGNOSIS — M4322 Fusion of spine, cervical region: Secondary | ICD-10-CM | POA: Diagnosis not present

## 2017-05-03 DIAGNOSIS — M436 Torticollis: Secondary | ICD-10-CM | POA: Diagnosis not present

## 2017-05-03 DIAGNOSIS — G249 Dystonia, unspecified: Secondary | ICD-10-CM | POA: Diagnosis not present

## 2017-05-03 DIAGNOSIS — M4322 Fusion of spine, cervical region: Secondary | ICD-10-CM | POA: Diagnosis not present

## 2017-05-03 DIAGNOSIS — M4326 Fusion of spine, lumbar region: Secondary | ICD-10-CM | POA: Diagnosis not present

## 2017-05-04 DIAGNOSIS — Z9889 Other specified postprocedural states: Secondary | ICD-10-CM | POA: Diagnosis not present

## 2017-05-04 DIAGNOSIS — Z79891 Long term (current) use of opiate analgesic: Secondary | ICD-10-CM | POA: Diagnosis not present

## 2017-05-04 DIAGNOSIS — G8929 Other chronic pain: Secondary | ICD-10-CM | POA: Diagnosis not present

## 2017-05-04 DIAGNOSIS — M542 Cervicalgia: Secondary | ICD-10-CM | POA: Diagnosis not present

## 2017-05-04 DIAGNOSIS — Z981 Arthrodesis status: Secondary | ICD-10-CM | POA: Diagnosis not present

## 2017-05-04 DIAGNOSIS — M4722 Other spondylosis with radiculopathy, cervical region: Secondary | ICD-10-CM | POA: Diagnosis not present

## 2017-05-05 DIAGNOSIS — M5412 Radiculopathy, cervical region: Secondary | ICD-10-CM | POA: Diagnosis not present

## 2017-05-20 DIAGNOSIS — M4322 Fusion of spine, cervical region: Secondary | ICD-10-CM | POA: Diagnosis not present

## 2017-05-20 DIAGNOSIS — Z4789 Encounter for other orthopedic aftercare: Secondary | ICD-10-CM | POA: Diagnosis not present

## 2017-05-20 DIAGNOSIS — Z981 Arthrodesis status: Secondary | ICD-10-CM | POA: Diagnosis not present

## 2017-05-27 DIAGNOSIS — M5412 Radiculopathy, cervical region: Secondary | ICD-10-CM | POA: Diagnosis not present

## 2017-05-27 DIAGNOSIS — I4510 Unspecified right bundle-branch block: Secondary | ICD-10-CM | POA: Diagnosis not present

## 2017-05-27 DIAGNOSIS — Z79899 Other long term (current) drug therapy: Secondary | ICD-10-CM | POA: Diagnosis not present

## 2017-05-27 DIAGNOSIS — M542 Cervicalgia: Secondary | ICD-10-CM | POA: Diagnosis not present

## 2017-05-27 DIAGNOSIS — Z79891 Long term (current) use of opiate analgesic: Secondary | ICD-10-CM | POA: Diagnosis not present

## 2017-05-27 DIAGNOSIS — M19011 Primary osteoarthritis, right shoulder: Secondary | ICD-10-CM | POA: Diagnosis not present

## 2017-05-27 DIAGNOSIS — M19012 Primary osteoarthritis, left shoulder: Secondary | ICD-10-CM | POA: Diagnosis not present

## 2017-06-24 ENCOUNTER — Other Ambulatory Visit: Payer: Self-pay | Admitting: Nurse Practitioner

## 2017-06-24 DIAGNOSIS — I10 Essential (primary) hypertension: Secondary | ICD-10-CM

## 2017-07-07 DIAGNOSIS — K5900 Constipation, unspecified: Secondary | ICD-10-CM | POA: Diagnosis not present

## 2017-07-07 DIAGNOSIS — I10 Essential (primary) hypertension: Secondary | ICD-10-CM | POA: Diagnosis not present

## 2017-07-07 DIAGNOSIS — E785 Hyperlipidemia, unspecified: Secondary | ICD-10-CM | POA: Diagnosis not present

## 2017-07-07 DIAGNOSIS — E1169 Type 2 diabetes mellitus with other specified complication: Secondary | ICD-10-CM | POA: Diagnosis not present

## 2017-07-13 DIAGNOSIS — G5623 Lesion of ulnar nerve, bilateral upper limbs: Secondary | ICD-10-CM | POA: Diagnosis not present

## 2017-07-13 DIAGNOSIS — G5613 Other lesions of median nerve, bilateral upper limbs: Secondary | ICD-10-CM | POA: Diagnosis not present

## 2017-07-13 DIAGNOSIS — G5603 Carpal tunnel syndrome, bilateral upper limbs: Secondary | ICD-10-CM | POA: Diagnosis not present

## 2017-07-26 DIAGNOSIS — M542 Cervicalgia: Secondary | ICD-10-CM | POA: Diagnosis not present

## 2017-07-26 DIAGNOSIS — M19012 Primary osteoarthritis, left shoulder: Secondary | ICD-10-CM | POA: Diagnosis not present

## 2017-07-26 DIAGNOSIS — M5412 Radiculopathy, cervical region: Secondary | ICD-10-CM | POA: Diagnosis not present

## 2017-07-26 DIAGNOSIS — M19011 Primary osteoarthritis, right shoulder: Secondary | ICD-10-CM | POA: Diagnosis not present

## 2017-09-08 DIAGNOSIS — G8929 Other chronic pain: Secondary | ICD-10-CM | POA: Diagnosis not present

## 2017-09-08 DIAGNOSIS — M25511 Pain in right shoulder: Secondary | ICD-10-CM | POA: Diagnosis not present

## 2017-09-13 DIAGNOSIS — G5603 Carpal tunnel syndrome, bilateral upper limbs: Secondary | ICD-10-CM | POA: Diagnosis not present

## 2017-09-13 DIAGNOSIS — Z9884 Bariatric surgery status: Secondary | ICD-10-CM | POA: Diagnosis not present

## 2017-09-13 DIAGNOSIS — S46011D Strain of muscle(s) and tendon(s) of the rotator cuff of right shoulder, subsequent encounter: Secondary | ICD-10-CM | POA: Diagnosis not present

## 2017-09-13 DIAGNOSIS — S46011S Strain of muscle(s) and tendon(s) of the rotator cuff of right shoulder, sequela: Secondary | ICD-10-CM | POA: Diagnosis not present

## 2017-09-13 DIAGNOSIS — E119 Type 2 diabetes mellitus without complications: Secondary | ICD-10-CM | POA: Diagnosis not present

## 2017-09-13 DIAGNOSIS — Z794 Long term (current) use of insulin: Secondary | ICD-10-CM | POA: Diagnosis not present

## 2017-10-03 DIAGNOSIS — Z794 Long term (current) use of insulin: Secondary | ICD-10-CM | POA: Diagnosis not present

## 2017-10-03 DIAGNOSIS — I110 Hypertensive heart disease with heart failure: Secondary | ICD-10-CM | POA: Diagnosis not present

## 2017-10-03 DIAGNOSIS — E860 Dehydration: Secondary | ICD-10-CM | POA: Diagnosis not present

## 2017-10-03 DIAGNOSIS — R05 Cough: Secondary | ICD-10-CM | POA: Diagnosis not present

## 2017-10-03 DIAGNOSIS — R0602 Shortness of breath: Secondary | ICD-10-CM | POA: Diagnosis not present

## 2017-10-03 DIAGNOSIS — R52 Pain, unspecified: Secondary | ICD-10-CM | POA: Diagnosis not present

## 2017-10-03 DIAGNOSIS — E119 Type 2 diabetes mellitus without complications: Secondary | ICD-10-CM | POA: Diagnosis not present

## 2017-10-03 DIAGNOSIS — I4510 Unspecified right bundle-branch block: Secondary | ICD-10-CM | POA: Diagnosis not present

## 2017-10-03 DIAGNOSIS — R600 Localized edema: Secondary | ICD-10-CM | POA: Diagnosis not present

## 2017-10-03 DIAGNOSIS — Z79899 Other long term (current) drug therapy: Secondary | ICD-10-CM | POA: Diagnosis not present

## 2017-10-03 DIAGNOSIS — E785 Hyperlipidemia, unspecified: Secondary | ICD-10-CM | POA: Diagnosis not present

## 2017-10-03 DIAGNOSIS — R918 Other nonspecific abnormal finding of lung field: Secondary | ICD-10-CM | POA: Diagnosis not present

## 2017-10-03 DIAGNOSIS — G8929 Other chronic pain: Secondary | ICD-10-CM | POA: Diagnosis not present

## 2017-10-03 DIAGNOSIS — M7989 Other specified soft tissue disorders: Secondary | ICD-10-CM | POA: Diagnosis not present

## 2017-10-03 DIAGNOSIS — I5032 Chronic diastolic (congestive) heart failure: Secondary | ICD-10-CM | POA: Diagnosis not present

## 2017-10-03 DIAGNOSIS — K5900 Constipation, unspecified: Secondary | ICD-10-CM | POA: Diagnosis not present

## 2017-10-03 DIAGNOSIS — E1169 Type 2 diabetes mellitus with other specified complication: Secondary | ICD-10-CM | POA: Diagnosis not present

## 2017-10-03 DIAGNOSIS — R0989 Other specified symptoms and signs involving the circulatory and respiratory systems: Secondary | ICD-10-CM | POA: Diagnosis not present

## 2017-10-03 DIAGNOSIS — R42 Dizziness and giddiness: Secondary | ICD-10-CM | POA: Diagnosis not present

## 2017-10-03 DIAGNOSIS — I499 Cardiac arrhythmia, unspecified: Secondary | ICD-10-CM | POA: Diagnosis not present

## 2017-10-03 DIAGNOSIS — N179 Acute kidney failure, unspecified: Secondary | ICD-10-CM | POA: Diagnosis not present

## 2017-10-04 DIAGNOSIS — I4510 Unspecified right bundle-branch block: Secondary | ICD-10-CM | POA: Diagnosis not present

## 2017-10-04 DIAGNOSIS — R531 Weakness: Secondary | ICD-10-CM | POA: Diagnosis not present

## 2017-10-04 DIAGNOSIS — N179 Acute kidney failure, unspecified: Secondary | ICD-10-CM | POA: Diagnosis not present

## 2017-10-04 DIAGNOSIS — I110 Hypertensive heart disease with heart failure: Secondary | ICD-10-CM | POA: Diagnosis not present

## 2017-10-04 DIAGNOSIS — M791 Myalgia, unspecified site: Secondary | ICD-10-CM | POA: Diagnosis not present

## 2017-10-04 DIAGNOSIS — R931 Abnormal findings on diagnostic imaging of heart and coronary circulation: Secondary | ICD-10-CM | POA: Diagnosis not present

## 2017-10-04 DIAGNOSIS — R42 Dizziness and giddiness: Secondary | ICD-10-CM | POA: Diagnosis not present

## 2017-10-05 DIAGNOSIS — R531 Weakness: Secondary | ICD-10-CM | POA: Diagnosis not present

## 2017-10-05 DIAGNOSIS — N179 Acute kidney failure, unspecified: Secondary | ICD-10-CM | POA: Diagnosis not present

## 2017-10-05 DIAGNOSIS — R42 Dizziness and giddiness: Secondary | ICD-10-CM | POA: Diagnosis not present

## 2017-10-05 DIAGNOSIS — E860 Dehydration: Secondary | ICD-10-CM | POA: Diagnosis not present

## 2017-10-05 DIAGNOSIS — M791 Myalgia, unspecified site: Secondary | ICD-10-CM | POA: Diagnosis not present

## 2017-10-05 DIAGNOSIS — R600 Localized edema: Secondary | ICD-10-CM | POA: Diagnosis not present

## 2017-10-06 DIAGNOSIS — N179 Acute kidney failure, unspecified: Secondary | ICD-10-CM | POA: Diagnosis not present

## 2017-10-06 DIAGNOSIS — I110 Hypertensive heart disease with heart failure: Secondary | ICD-10-CM | POA: Diagnosis not present

## 2017-10-06 DIAGNOSIS — R42 Dizziness and giddiness: Secondary | ICD-10-CM | POA: Diagnosis not present

## 2017-10-06 DIAGNOSIS — R531 Weakness: Secondary | ICD-10-CM | POA: Diagnosis not present

## 2017-10-06 DIAGNOSIS — M791 Myalgia, unspecified site: Secondary | ICD-10-CM | POA: Diagnosis not present

## 2017-10-13 DIAGNOSIS — Z79899 Other long term (current) drug therapy: Secondary | ICD-10-CM | POA: Diagnosis not present

## 2017-10-13 DIAGNOSIS — E1169 Type 2 diabetes mellitus with other specified complication: Secondary | ICD-10-CM | POA: Diagnosis not present

## 2017-10-13 DIAGNOSIS — W19XXXS Unspecified fall, sequela: Secondary | ICD-10-CM | POA: Diagnosis not present

## 2017-10-13 DIAGNOSIS — N179 Acute kidney failure, unspecified: Secondary | ICD-10-CM | POA: Diagnosis not present

## 2017-10-13 DIAGNOSIS — J181 Lobar pneumonia, unspecified organism: Secondary | ICD-10-CM | POA: Diagnosis not present

## 2017-10-24 ENCOUNTER — Other Ambulatory Visit: Payer: Self-pay | Admitting: Nurse Practitioner

## 2017-10-24 DIAGNOSIS — E1140 Type 2 diabetes mellitus with diabetic neuropathy, unspecified: Secondary | ICD-10-CM

## 2017-10-24 DIAGNOSIS — G5610 Other lesions of median nerve, unspecified upper limb: Secondary | ICD-10-CM

## 2017-10-24 DIAGNOSIS — Z794 Long term (current) use of insulin: Principal | ICD-10-CM

## 2017-11-16 DIAGNOSIS — R05 Cough: Secondary | ICD-10-CM | POA: Diagnosis not present

## 2017-11-22 DIAGNOSIS — J019 Acute sinusitis, unspecified: Secondary | ICD-10-CM | POA: Diagnosis not present

## 2017-11-22 DIAGNOSIS — J029 Acute pharyngitis, unspecified: Secondary | ICD-10-CM | POA: Diagnosis not present

## 2017-11-27 ENCOUNTER — Other Ambulatory Visit: Payer: Self-pay | Admitting: Nurse Practitioner

## 2018-01-26 DIAGNOSIS — M4326 Fusion of spine, lumbar region: Secondary | ICD-10-CM | POA: Diagnosis not present

## 2018-01-26 DIAGNOSIS — M961 Postlaminectomy syndrome, not elsewhere classified: Secondary | ICD-10-CM | POA: Diagnosis not present

## 2018-01-26 DIAGNOSIS — M4322 Fusion of spine, cervical region: Secondary | ICD-10-CM | POA: Diagnosis not present

## 2018-01-26 DIAGNOSIS — Z0289 Encounter for other administrative examinations: Secondary | ICD-10-CM | POA: Diagnosis not present

## 2018-02-18 DIAGNOSIS — Z23 Encounter for immunization: Secondary | ICD-10-CM | POA: Diagnosis not present

## 2018-02-18 DIAGNOSIS — J321 Chronic frontal sinusitis: Secondary | ICD-10-CM | POA: Diagnosis not present

## 2018-02-18 DIAGNOSIS — S61202A Unspecified open wound of right middle finger without damage to nail, initial encounter: Secondary | ICD-10-CM | POA: Diagnosis not present

## 2018-05-09 DIAGNOSIS — M79672 Pain in left foot: Secondary | ICD-10-CM | POA: Diagnosis not present

## 2018-05-16 DIAGNOSIS — M79672 Pain in left foot: Secondary | ICD-10-CM | POA: Diagnosis not present

## 2018-05-16 DIAGNOSIS — M7672 Peroneal tendinitis, left leg: Secondary | ICD-10-CM | POA: Diagnosis not present

## 2018-06-02 DIAGNOSIS — J329 Chronic sinusitis, unspecified: Secondary | ICD-10-CM | POA: Diagnosis not present

## 2018-06-02 DIAGNOSIS — J40 Bronchitis, not specified as acute or chronic: Secondary | ICD-10-CM | POA: Diagnosis not present

## 2018-07-18 DIAGNOSIS — E1169 Type 2 diabetes mellitus with other specified complication: Secondary | ICD-10-CM | POA: Diagnosis not present

## 2018-07-18 DIAGNOSIS — M25562 Pain in left knee: Secondary | ICD-10-CM | POA: Diagnosis not present

## 2018-07-26 DIAGNOSIS — G8929 Other chronic pain: Secondary | ICD-10-CM | POA: Diagnosis not present

## 2018-07-26 DIAGNOSIS — M25461 Effusion, right knee: Secondary | ICD-10-CM | POA: Diagnosis not present

## 2018-07-26 DIAGNOSIS — M170 Bilateral primary osteoarthritis of knee: Secondary | ICD-10-CM | POA: Diagnosis not present

## 2018-07-26 DIAGNOSIS — M11261 Other chondrocalcinosis, right knee: Secondary | ICD-10-CM | POA: Diagnosis not present

## 2018-07-26 DIAGNOSIS — M25462 Effusion, left knee: Secondary | ICD-10-CM | POA: Diagnosis not present

## 2018-07-26 DIAGNOSIS — M25561 Pain in right knee: Secondary | ICD-10-CM | POA: Diagnosis not present

## 2018-07-26 DIAGNOSIS — M11262 Other chondrocalcinosis, left knee: Secondary | ICD-10-CM | POA: Diagnosis not present

## 2018-07-26 DIAGNOSIS — M25562 Pain in left knee: Secondary | ICD-10-CM | POA: Diagnosis not present

## 2018-09-27 DIAGNOSIS — M170 Bilateral primary osteoarthritis of knee: Secondary | ICD-10-CM | POA: Diagnosis not present

## 2018-09-27 DIAGNOSIS — M25462 Effusion, left knee: Secondary | ICD-10-CM | POA: Diagnosis not present

## 2018-09-27 DIAGNOSIS — M25461 Effusion, right knee: Secondary | ICD-10-CM | POA: Diagnosis not present

## 2018-09-28 DIAGNOSIS — R141 Gas pain: Secondary | ICD-10-CM | POA: Diagnosis not present

## 2018-09-28 DIAGNOSIS — E1169 Type 2 diabetes mellitus with other specified complication: Secondary | ICD-10-CM | POA: Diagnosis not present

## 2018-09-28 DIAGNOSIS — R1013 Epigastric pain: Secondary | ICD-10-CM | POA: Diagnosis not present

## 2018-09-29 DIAGNOSIS — K8020 Calculus of gallbladder without cholecystitis without obstruction: Secondary | ICD-10-CM | POA: Diagnosis not present

## 2018-10-12 DIAGNOSIS — M170 Bilateral primary osteoarthritis of knee: Secondary | ICD-10-CM | POA: Diagnosis not present

## 2018-10-12 DIAGNOSIS — M19012 Primary osteoarthritis, left shoulder: Secondary | ICD-10-CM | POA: Diagnosis not present

## 2018-10-12 DIAGNOSIS — M19011 Primary osteoarthritis, right shoulder: Secondary | ICD-10-CM | POA: Diagnosis not present

## 2018-10-31 DIAGNOSIS — G4700 Insomnia, unspecified: Secondary | ICD-10-CM | POA: Insufficient documentation

## 2018-10-31 DIAGNOSIS — G8929 Other chronic pain: Secondary | ICD-10-CM | POA: Insufficient documentation

## 2018-11-15 DIAGNOSIS — M25562 Pain in left knee: Secondary | ICD-10-CM | POA: Diagnosis not present

## 2018-11-15 DIAGNOSIS — M170 Bilateral primary osteoarthritis of knee: Secondary | ICD-10-CM | POA: Diagnosis not present

## 2018-11-17 ENCOUNTER — Other Ambulatory Visit: Payer: Self-pay

## 2018-11-17 DIAGNOSIS — R6889 Other general symptoms and signs: Secondary | ICD-10-CM | POA: Diagnosis not present

## 2018-11-17 DIAGNOSIS — Z20822 Contact with and (suspected) exposure to covid-19: Secondary | ICD-10-CM

## 2018-11-18 LAB — NOVEL CORONAVIRUS, NAA: SARS-CoV-2, NAA: NOT DETECTED

## 2018-12-06 ENCOUNTER — Other Ambulatory Visit: Payer: Self-pay

## 2018-12-06 DIAGNOSIS — Z20822 Contact with and (suspected) exposure to covid-19: Secondary | ICD-10-CM

## 2018-12-06 DIAGNOSIS — R6889 Other general symptoms and signs: Secondary | ICD-10-CM | POA: Diagnosis not present

## 2018-12-07 LAB — NOVEL CORONAVIRUS, NAA: SARS-CoV-2, NAA: NOT DETECTED

## 2019-01-20 ENCOUNTER — Emergency Department (HOSPITAL_COMMUNITY): Payer: Medicare Other

## 2019-01-20 ENCOUNTER — Encounter (HOSPITAL_COMMUNITY): Payer: Self-pay | Admitting: Emergency Medicine

## 2019-01-20 ENCOUNTER — Inpatient Hospital Stay (HOSPITAL_COMMUNITY)
Admission: EM | Admit: 2019-01-20 | Discharge: 2019-02-10 | DRG: 853 | Disposition: A | Discharge status: Home Health | Payer: Medicare Other | Attending: Internal Medicine | Admitting: Internal Medicine

## 2019-01-20 ENCOUNTER — Other Ambulatory Visit: Payer: Self-pay

## 2019-01-20 DIAGNOSIS — E119 Type 2 diabetes mellitus without complications: Secondary | ICD-10-CM | POA: Diagnosis present

## 2019-01-20 DIAGNOSIS — K660 Peritoneal adhesions (postprocedural) (postinfection): Secondary | ICD-10-CM | POA: Diagnosis present

## 2019-01-20 DIAGNOSIS — J90 Pleural effusion, not elsewhere classified: Secondary | ICD-10-CM | POA: Diagnosis not present

## 2019-01-20 DIAGNOSIS — J449 Chronic obstructive pulmonary disease, unspecified: Secondary | ICD-10-CM | POA: Diagnosis present

## 2019-01-20 DIAGNOSIS — N179 Acute kidney failure, unspecified: Secondary | ICD-10-CM | POA: Diagnosis present

## 2019-01-20 DIAGNOSIS — I160 Hypertensive urgency: Secondary | ICD-10-CM | POA: Diagnosis present

## 2019-01-20 DIAGNOSIS — E785 Hyperlipidemia, unspecified: Secondary | ICD-10-CM | POA: Diagnosis present

## 2019-01-20 DIAGNOSIS — R748 Abnormal levels of other serum enzymes: Secondary | ICD-10-CM | POA: Diagnosis not present

## 2019-01-20 DIAGNOSIS — Z5331 Laparoscopic surgical procedure converted to open procedure: Secondary | ICD-10-CM

## 2019-01-20 DIAGNOSIS — T8383XA Hemorrhage of genitourinary prosthetic devices, implants and grafts, initial encounter: Secondary | ICD-10-CM | POA: Diagnosis not present

## 2019-01-20 DIAGNOSIS — F329 Major depressive disorder, single episode, unspecified: Secondary | ICD-10-CM | POA: Diagnosis present

## 2019-01-20 DIAGNOSIS — Z452 Encounter for adjustment and management of vascular access device: Secondary | ICD-10-CM | POA: Diagnosis not present

## 2019-01-20 DIAGNOSIS — R1084 Generalized abdominal pain: Secondary | ICD-10-CM | POA: Diagnosis not present

## 2019-01-20 DIAGNOSIS — A419 Sepsis, unspecified organism: Secondary | ICD-10-CM | POA: Diagnosis not present

## 2019-01-20 DIAGNOSIS — G8929 Other chronic pain: Secondary | ICD-10-CM | POA: Diagnosis present

## 2019-01-20 DIAGNOSIS — B181 Chronic viral hepatitis B without delta-agent: Secondary | ICD-10-CM | POA: Diagnosis present

## 2019-01-20 DIAGNOSIS — K851 Biliary acute pancreatitis without necrosis or infection: Secondary | ICD-10-CM | POA: Diagnosis present

## 2019-01-20 DIAGNOSIS — M1712 Unilateral primary osteoarthritis, left knee: Secondary | ICD-10-CM | POA: Diagnosis present

## 2019-01-20 DIAGNOSIS — R6521 Severe sepsis with septic shock: Secondary | ICD-10-CM | POA: Diagnosis present

## 2019-01-20 DIAGNOSIS — R7989 Other specified abnormal findings of blood chemistry: Secondary | ICD-10-CM

## 2019-01-20 DIAGNOSIS — R1114 Bilious vomiting: Secondary | ICD-10-CM

## 2019-01-20 DIAGNOSIS — I129 Hypertensive chronic kidney disease with stage 1 through stage 4 chronic kidney disease, or unspecified chronic kidney disease: Secondary | ICD-10-CM | POA: Diagnosis not present

## 2019-01-20 DIAGNOSIS — Z6841 Body Mass Index (BMI) 40.0 and over, adult: Secondary | ICD-10-CM

## 2019-01-20 DIAGNOSIS — R109 Unspecified abdominal pain: Secondary | ICD-10-CM | POA: Diagnosis not present

## 2019-01-20 DIAGNOSIS — M25562 Pain in left knee: Secondary | ICD-10-CM

## 2019-01-20 DIAGNOSIS — R2689 Other abnormalities of gait and mobility: Secondary | ICD-10-CM | POA: Diagnosis not present

## 2019-01-20 DIAGNOSIS — K289 Gastrojejunal ulcer, unspecified as acute or chronic, without hemorrhage or perforation: Secondary | ICD-10-CM | POA: Diagnosis not present

## 2019-01-20 DIAGNOSIS — Y846 Urinary catheterization as the cause of abnormal reaction of the patient, or of later complication, without mention of misadventure at the time of the procedure: Secondary | ICD-10-CM | POA: Diagnosis not present

## 2019-01-20 DIAGNOSIS — K859 Acute pancreatitis without necrosis or infection, unspecified: Secondary | ICD-10-CM

## 2019-01-20 DIAGNOSIS — E669 Obesity, unspecified: Secondary | ICD-10-CM | POA: Diagnosis not present

## 2019-01-20 DIAGNOSIS — K760 Fatty (change of) liver, not elsewhere classified: Secondary | ICD-10-CM | POA: Diagnosis not present

## 2019-01-20 DIAGNOSIS — N183 Chronic kidney disease, stage 3 unspecified: Secondary | ICD-10-CM | POA: Diagnosis present

## 2019-01-20 DIAGNOSIS — K8062 Calculus of gallbladder and bile duct with acute cholecystitis without obstruction: Secondary | ICD-10-CM | POA: Diagnosis not present

## 2019-01-20 DIAGNOSIS — R0602 Shortness of breath: Secondary | ICD-10-CM | POA: Diagnosis not present

## 2019-01-20 DIAGNOSIS — E7800 Pure hypercholesterolemia, unspecified: Secondary | ICD-10-CM | POA: Diagnosis present

## 2019-01-20 DIAGNOSIS — E876 Hypokalemia: Secondary | ICD-10-CM | POA: Diagnosis present

## 2019-01-20 DIAGNOSIS — K7200 Acute and subacute hepatic failure without coma: Secondary | ICD-10-CM | POA: Diagnosis present

## 2019-01-20 DIAGNOSIS — Z9884 Bariatric surgery status: Secondary | ICD-10-CM

## 2019-01-20 DIAGNOSIS — E1151 Type 2 diabetes mellitus with diabetic peripheral angiopathy without gangrene: Secondary | ICD-10-CM | POA: Diagnosis present

## 2019-01-20 DIAGNOSIS — F419 Anxiety disorder, unspecified: Secondary | ICD-10-CM | POA: Diagnosis present

## 2019-01-20 DIAGNOSIS — E8770 Fluid overload, unspecified: Secondary | ICD-10-CM | POA: Diagnosis not present

## 2019-01-20 DIAGNOSIS — I480 Paroxysmal atrial fibrillation: Secondary | ICD-10-CM | POA: Diagnosis present

## 2019-01-20 DIAGNOSIS — Z23 Encounter for immunization: Secondary | ICD-10-CM | POA: Diagnosis not present

## 2019-01-20 DIAGNOSIS — I5020 Unspecified systolic (congestive) heart failure: Secondary | ICD-10-CM | POA: Diagnosis not present

## 2019-01-20 DIAGNOSIS — D631 Anemia in chronic kidney disease: Secondary | ICD-10-CM | POA: Diagnosis present

## 2019-01-20 DIAGNOSIS — J9601 Acute respiratory failure with hypoxia: Secondary | ICD-10-CM | POA: Diagnosis not present

## 2019-01-20 DIAGNOSIS — A4189 Other specified sepsis: Secondary | ICD-10-CM | POA: Diagnosis not present

## 2019-01-20 DIAGNOSIS — Z794 Long term (current) use of insulin: Secondary | ICD-10-CM

## 2019-01-20 DIAGNOSIS — E871 Hypo-osmolality and hyponatremia: Secondary | ICD-10-CM | POA: Diagnosis not present

## 2019-01-20 DIAGNOSIS — G4733 Obstructive sleep apnea (adult) (pediatric): Secondary | ICD-10-CM | POA: Diagnosis not present

## 2019-01-20 DIAGNOSIS — I509 Heart failure, unspecified: Secondary | ICD-10-CM | POA: Diagnosis not present

## 2019-01-20 DIAGNOSIS — R1011 Right upper quadrant pain: Secondary | ICD-10-CM | POA: Diagnosis not present

## 2019-01-20 DIAGNOSIS — K219 Gastro-esophageal reflux disease without esophagitis: Secondary | ICD-10-CM | POA: Diagnosis present

## 2019-01-20 DIAGNOSIS — Z743 Need for continuous supervision: Secondary | ICD-10-CM | POA: Diagnosis not present

## 2019-01-20 DIAGNOSIS — I5033 Acute on chronic diastolic (congestive) heart failure: Secondary | ICD-10-CM | POA: Diagnosis not present

## 2019-01-20 DIAGNOSIS — K838 Other specified diseases of biliary tract: Secondary | ICD-10-CM | POA: Diagnosis not present

## 2019-01-20 DIAGNOSIS — K8020 Calculus of gallbladder without cholecystitis without obstruction: Secondary | ICD-10-CM | POA: Diagnosis not present

## 2019-01-20 DIAGNOSIS — E1140 Type 2 diabetes mellitus with diabetic neuropathy, unspecified: Secondary | ICD-10-CM | POA: Diagnosis not present

## 2019-01-20 DIAGNOSIS — Z79899 Other long term (current) drug therapy: Secondary | ICD-10-CM

## 2019-01-20 DIAGNOSIS — I4510 Unspecified right bundle-branch block: Secondary | ICD-10-CM | POA: Diagnosis not present

## 2019-01-20 DIAGNOSIS — M109 Gout, unspecified: Secondary | ICD-10-CM

## 2019-01-20 DIAGNOSIS — R935 Abnormal findings on diagnostic imaging of other abdominal regions, including retroperitoneum: Secondary | ICD-10-CM | POA: Diagnosis not present

## 2019-01-20 DIAGNOSIS — K811 Chronic cholecystitis: Secondary | ICD-10-CM | POA: Diagnosis not present

## 2019-01-20 DIAGNOSIS — R159 Full incontinence of feces: Secondary | ICD-10-CM | POA: Diagnosis not present

## 2019-01-20 DIAGNOSIS — R079 Chest pain, unspecified: Secondary | ICD-10-CM | POA: Diagnosis not present

## 2019-01-20 DIAGNOSIS — E1169 Type 2 diabetes mellitus with other specified complication: Secondary | ICD-10-CM | POA: Diagnosis not present

## 2019-01-20 DIAGNOSIS — E1122 Type 2 diabetes mellitus with diabetic chronic kidney disease: Secondary | ICD-10-CM | POA: Diagnosis not present

## 2019-01-20 DIAGNOSIS — K7689 Other specified diseases of liver: Secondary | ICD-10-CM | POA: Diagnosis not present

## 2019-01-20 DIAGNOSIS — D72829 Elevated white blood cell count, unspecified: Secondary | ICD-10-CM

## 2019-01-20 DIAGNOSIS — Z7952 Long term (current) use of systemic steroids: Secondary | ICD-10-CM

## 2019-01-20 DIAGNOSIS — R7881 Bacteremia: Secondary | ICD-10-CM | POA: Diagnosis not present

## 2019-01-20 DIAGNOSIS — I130 Hypertensive heart and chronic kidney disease with heart failure and stage 1 through stage 4 chronic kidney disease, or unspecified chronic kidney disease: Secondary | ICD-10-CM | POA: Diagnosis present

## 2019-01-20 DIAGNOSIS — K810 Acute cholecystitis: Secondary | ICD-10-CM | POA: Diagnosis not present

## 2019-01-20 DIAGNOSIS — R0789 Other chest pain: Secondary | ICD-10-CM | POA: Diagnosis not present

## 2019-01-20 DIAGNOSIS — K831 Obstruction of bile duct: Secondary | ICD-10-CM | POA: Diagnosis not present

## 2019-01-20 DIAGNOSIS — K828 Other specified diseases of gallbladder: Secondary | ICD-10-CM | POA: Diagnosis present

## 2019-01-20 DIAGNOSIS — K5730 Diverticulosis of large intestine without perforation or abscess without bleeding: Secondary | ICD-10-CM | POA: Diagnosis not present

## 2019-01-20 DIAGNOSIS — I10 Essential (primary) hypertension: Secondary | ICD-10-CM | POA: Diagnosis not present

## 2019-01-20 DIAGNOSIS — Z20828 Contact with and (suspected) exposure to other viral communicable diseases: Secondary | ICD-10-CM | POA: Diagnosis present

## 2019-01-20 DIAGNOSIS — R110 Nausea: Secondary | ICD-10-CM | POA: Diagnosis not present

## 2019-01-20 DIAGNOSIS — N189 Chronic kidney disease, unspecified: Secondary | ICD-10-CM | POA: Diagnosis not present

## 2019-01-20 DIAGNOSIS — M7989 Other specified soft tissue disorders: Secondary | ICD-10-CM | POA: Diagnosis not present

## 2019-01-20 DIAGNOSIS — E1165 Type 2 diabetes mellitus with hyperglycemia: Secondary | ICD-10-CM | POA: Diagnosis present

## 2019-01-20 DIAGNOSIS — IMO0002 Reserved for concepts with insufficient information to code with codable children: Secondary | ICD-10-CM | POA: Diagnosis present

## 2019-01-20 DIAGNOSIS — K5909 Other constipation: Secondary | ICD-10-CM | POA: Diagnosis present

## 2019-01-20 DIAGNOSIS — K9189 Other postprocedural complications and disorders of digestive system: Secondary | ICD-10-CM | POA: Diagnosis not present

## 2019-01-20 LAB — COMPREHENSIVE METABOLIC PANEL
ALT: 24 U/L (ref 0–44)
AST: 24 U/L (ref 15–41)
Albumin: 3.6 g/dL (ref 3.5–5.0)
Alkaline Phosphatase: 58 U/L (ref 38–126)
Anion gap: 14 (ref 5–15)
BUN: 14 mg/dL (ref 8–23)
CO2: 22 mmol/L (ref 22–32)
Calcium: 9.1 mg/dL (ref 8.9–10.3)
Chloride: 98 mmol/L (ref 98–111)
Creatinine, Ser: 0.92 mg/dL (ref 0.61–1.24)
GFR calc Af Amer: >60 mL/min (ref >60)
GFR calc non Af Amer: >60 mL/min (ref >60)
Glucose, Bld: 258 mg/dL — ABNORMAL HIGH (ref 70–99)
Potassium: 4.3 mmol/L (ref 3.5–5.1)
Sodium: 134 mmol/L — ABNORMAL LOW (ref 135–145)
Total Bilirubin: 0.8 mg/dL (ref 0.3–1.2)
Total Protein: 7.3 g/dL (ref 6.5–8.1)

## 2019-01-20 LAB — TROPONIN I (HIGH SENSITIVITY)
Troponin I (High Sensitivity): 6 ng/L (ref <18)
Troponin I (High Sensitivity): 6 ng/L (ref <18)

## 2019-01-20 LAB — CBC WITH DIFFERENTIAL/PLATELET
Abs Immature Granulocytes: 0.03 10*3/uL (ref 0.00–0.07)
Basophils Absolute: 0 10*3/uL (ref 0.0–0.1)
Basophils Relative: 0 %
Eosinophils Absolute: 0.1 10*3/uL (ref 0.0–0.5)
Eosinophils Relative: 1 %
HCT: 44.4 % (ref 39.0–52.0)
Hemoglobin: 14.1 g/dL (ref 13.0–17.0)
Immature Granulocytes: 0 %
Lymphocytes Relative: 10 %
Lymphs Abs: 1 10*3/uL (ref 0.7–4.0)
MCH: 28.5 pg (ref 26.0–34.0)
MCHC: 31.8 g/dL (ref 30.0–36.0)
MCV: 89.7 fL (ref 80.0–100.0)
Monocytes Absolute: 0.7 10*3/uL (ref 0.1–1.0)
Monocytes Relative: 7 %
Neutro Abs: 8.9 10*3/uL — ABNORMAL HIGH (ref 1.7–7.7)
Neutrophils Relative %: 82 %
Platelets: 190 10*3/uL (ref 150–400)
RBC: 4.95 MIL/uL (ref 4.22–5.81)
RDW: 15.6 % — ABNORMAL HIGH (ref 11.5–15.5)
WBC: 10.8 10*3/uL — ABNORMAL HIGH (ref 4.0–10.5)
nRBC: 0 % (ref 0.0–0.2)

## 2019-01-20 LAB — PROTIME-INR
INR: 1 (ref 0.8–1.2)
Prothrombin Time: 12.8 seconds (ref 11.4–15.2)

## 2019-01-20 LAB — LIPASE, BLOOD: Lipase: 31 U/L (ref 11–51)

## 2019-01-20 LAB — MAGNESIUM: Magnesium: 1.8 mg/dL (ref 1.7–2.4)

## 2019-01-20 LAB — SARS CORONAVIRUS 2 (TAT 6-24 HRS): SARS Coronavirus 2: NEGATIVE

## 2019-01-20 LAB — CBG MONITORING, ED
Glucose-Capillary: 237 mg/dL — ABNORMAL HIGH (ref 70–99)
Glucose-Capillary: 224 mg/dL — ABNORMAL HIGH (ref 70–99)

## 2019-01-20 LAB — BRAIN NATRIURETIC PEPTIDE: B Natriuretic Peptide: 65.4 pg/mL (ref 0.0–100.0)

## 2019-01-20 MED ORDER — FENTANYL CITRATE (PF) 100 MCG/2ML IJ SOLN
50.0000 ug | Freq: Once | INTRAMUSCULAR | Status: AC
  Administered 2019-01-20: 50 ug via INTRAVENOUS
  Filled 2019-01-20: qty 2

## 2019-01-20 MED ORDER — LISINOPRIL 20 MG PO TABS
40.0000 mg | ORAL_TABLET | Freq: Every day | ORAL | Status: DC
  Administered 2019-01-21: 40 mg via ORAL
  Filled 2019-01-20: qty 2

## 2019-01-20 MED ORDER — TORSEMIDE 100 MG PO TABS: 100.0000 mg | ORAL_TABLET | Freq: Two times a day (BID) | ORAL | Status: DC

## 2019-01-20 MED ORDER — HYDROMORPHONE HCL 1 MG/ML IJ SOLN
1.0000 mg | Freq: Once | INTRAMUSCULAR | Status: AC
  Administered 2019-01-20: 1 mg via INTRAVENOUS
  Filled 2019-01-20: qty 1

## 2019-01-20 MED ORDER — MORPHINE SULFATE (PF) 2 MG/ML IV SOLN
2.0000 mg | Freq: Three times a day (TID) | INTRAVENOUS | Status: DC | PRN
  Administered 2019-01-20 – 2019-01-21 (×2): 2 mg via INTRAVENOUS
  Administered 2019-01-22: 1 mg via INTRAVENOUS
  Filled 2019-01-20 (×5): qty 1

## 2019-01-20 MED ORDER — GABAPENTIN 300 MG PO CAPS
300.0000 mg | ORAL_CAPSULE | Freq: Three times a day (TID) | ORAL | Status: DC | PRN
  Administered 2019-01-21: 300 mg via ORAL
  Filled 2019-01-20: qty 1

## 2019-01-20 MED ORDER — TORSEMIDE 100 MG PO TABS
100.0000 mg | ORAL_TABLET | Freq: Every day | ORAL | Status: DC
  Administered 2019-01-20 – 2019-01-21 (×2): 100 mg via ORAL
  Filled 2019-01-20: qty 1
  Filled 2019-01-20: qty 5

## 2019-01-20 MED ORDER — FAMOTIDINE IN NACL 20-0.9 MG/50ML-% IV SOLN
20.0000 mg | Freq: Once | INTRAVENOUS | Status: AC
  Administered 2019-01-20: 20 mg via INTRAVENOUS
  Filled 2019-01-20: qty 50

## 2019-01-20 MED ORDER — BISACODYL 10 MG RE SUPP: 10.0000 mg | Freq: Every day | RECTAL | Status: DC | PRN

## 2019-01-20 MED ORDER — TRAZODONE HCL 100 MG PO TABS
300.0000 mg | ORAL_TABLET | Freq: Every day | ORAL | Status: DC
  Filled 2019-01-20: qty 6
  Filled 2019-01-20: qty 3

## 2019-01-20 MED ORDER — INSULIN GLARGINE 100 UNIT/ML ~~LOC~~ SOLN
20.0000 [IU] | Freq: Every day | SUBCUTANEOUS | Status: DC
  Administered 2019-01-20 – 2019-02-09 (×21): 20 [IU] via SUBCUTANEOUS
  Filled 2019-01-20 (×24): qty 0.2

## 2019-01-20 MED ORDER — ACETAMINOPHEN 500 MG PO TABS
1000.0000 mg | ORAL_TABLET | Freq: Two times a day (BID) | ORAL | Status: DC
  Administered 2019-01-20 – 2019-01-21 (×2): 1000 mg via ORAL
  Filled 2019-01-20 (×2): qty 2

## 2019-01-20 MED ORDER — LABETALOL HCL 5 MG/ML IV SOLN
10.0000 mg | Freq: Once | INTRAVENOUS | Status: AC
  Administered 2019-01-20: 10 mg via INTRAVENOUS
  Filled 2019-01-20: qty 4

## 2019-01-20 MED ORDER — ONDANSETRON HCL 4 MG/2ML IJ SOLN
4.0000 mg | Freq: Once | INTRAMUSCULAR | Status: AC
  Administered 2019-01-20: 4 mg via INTRAVENOUS
  Filled 2019-01-20: qty 2

## 2019-01-20 MED ORDER — PANTOPRAZOLE SODIUM 40 MG PO TBEC: 40.0000 mg | DELAYED_RELEASE_TABLET | Freq: Every day | ORAL | Status: DC

## 2019-01-20 MED ORDER — METOPROLOL TARTRATE 50 MG PO TABS
50.0000 mg | ORAL_TABLET | Freq: Two times a day (BID) | ORAL | Status: DC
  Administered 2019-01-20 – 2019-01-21 (×2): 50 mg via ORAL
  Filled 2019-01-20: qty 1
  Filled 2019-01-20 (×2): qty 2

## 2019-01-20 MED ORDER — LISINOPRIL 20 MG PO TABS
40.0000 mg | ORAL_TABLET | Freq: Once | ORAL | Status: AC
  Administered 2019-01-20: 17:00:00 40 mg via ORAL
  Filled 2019-01-20: qty 2

## 2019-01-20 MED ORDER — SUCRALFATE 1 GM/10ML PO SUSP
1.0000 g | Freq: Once | ORAL | Status: AC
  Administered 2019-01-20: 1 g via ORAL
  Filled 2019-01-20: qty 10

## 2019-01-20 MED ORDER — ATORVASTATIN CALCIUM 40 MG PO TABS
40.0000 mg | ORAL_TABLET | Freq: Every day | ORAL | Status: DC
  Administered 2019-01-20 – 2019-01-21 (×2): 40 mg via ORAL
  Filled 2019-01-20 (×3): qty 1

## 2019-01-20 MED ORDER — POLYETHYLENE GLYCOL 3350 17 G PO PACK: 17.0000 g | PACK | Freq: Every day | ORAL | Status: DC | PRN

## 2019-01-20 MED ORDER — DULOXETINE HCL 60 MG PO CPEP
60.0000 mg | ORAL_CAPSULE | Freq: Two times a day (BID) | ORAL | Status: DC
  Administered 2019-01-20 – 2019-01-21 (×2): 60 mg via ORAL
  Filled 2019-01-20 (×2): qty 1

## 2019-01-20 MED ORDER — IOHEXOL 300 MG/ML  SOLN
100.0000 mL | Freq: Once | INTRAMUSCULAR | Status: AC | PRN
  Administered 2019-01-20: 100 mL via INTRAVENOUS

## 2019-01-20 MED ORDER — ASPIRIN 81 MG PO CHEW
324.0000 mg | CHEWABLE_TABLET | Freq: Once | ORAL | Status: AC
  Administered 2019-01-20: 324 mg via ORAL
  Filled 2019-01-20: qty 4

## 2019-01-20 MED ORDER — SIMETHICONE 80 MG PO CHEW
80.0000 mg | CHEWABLE_TABLET | Freq: Four times a day (QID) | ORAL | Status: DC
  Administered 2019-01-20 – 2019-02-10 (×75): 80 mg via ORAL
  Filled 2019-01-20 (×78): qty 1

## 2019-01-20 MED ORDER — THIAMINE HCL 100 MG/ML IJ SOLN
100.0000 mg | INTRAMUSCULAR | Status: DC
  Administered 2019-01-20: 19:00:00 100 mg via INTRAVENOUS
  Filled 2019-01-20: qty 2

## 2019-01-20 NOTE — ED Notes (Signed)
ED TO INPATIENT HANDOFF REPORT  ED Nurse Name and Phone #:  [phone]  S Name/Age/Gender Phillip Maldonado 68 y.o. male Room/Bed: 036C/036C  Code Status   Code Status: Prior  Home/SNF/Other Home Patient oriented to: self, place, time and situation Is this baseline? Yes   Triage Complete: Triage complete  Chief Complaint Chest pain  Triage Note Pt here with [facility] with diffuse belly pain and lower chest pain since 430.  Pt is nauseated.  EMS RBBB. Hx of CHF DM and HTN.   Allergies Allergies  Allergen Reactions  . Adhesive [Tape] Other (See Comments)    Unknown paper tape ok to use   . Feldene [Piroxicam] Other (See Comments)    unknown  . Latex Other (See Comments)    Rash and itching    Level of Care/Admitting Diagnosis ED Disposition    ED Disposition Condition Comment   Admit  Hospital Area: [facility] [[id number]]  Level of Care: Med-Surg [16]  Covid Evaluation: Asymptomatic Screening Protocol (No Symptoms)  Diagnosis: Abdominal pain [id number]  Admitting Physician: Laren Everts, [place]  Attending Physician: Laren Everts, ALI Marshal.[place]  PT Class (Do Not Modify): Observation [104]  PT Acc Code (Do Not Modify): Observation [10022]       B Medical/Surgery History Past Medical History:  Diagnosis Date  . Anemia   . Angina   . Anxiety   . Arthritis   . Atrial fibrillation with RVR ([place]) 01/24/2013  . Blood transfusion    last 10' 14- "GI bleed"  . CHF (congestive heart failure) ([place])   . CKD (chronic kidney disease) stage 3, GFR 30-59 ml/min 01/08/2016  . Depression   . Diabetes mellitus ([place]) 09/18/2011  . Diverticulosis   . DJD (degenerative joint disease)   . DM type 2, uncontrolled, with neuropathy ([place]) 12/26/2013  . Edema extremities    bilateral lower extremities-"weepy areas due to fluid retention"  . Fatty liver   . Fundic gland polyps of stomach, benign   . Headache(784.0)   . Hepatitis B   . History of alcohol abuse    last drink in  1993  . Hypertension   . Neuromuscular disorder ([place])   . 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 Obesity   . Obesity, morbid ([place]) 07/30/2015  . Pneumonia    not at present time  . Renal insufficiency   . Shortness of breath   . Sleep apnea, obstructive    does where bipap. 06-04-14 "doesn't use much now, no mask/tubing now.   Past Surgical History:  Procedure Laterality Date  . ABDOMINAL SURGERY     [phone]  . BACK SURGERY     x 8 -,multiple fusions(cervical to lumbar)  . COLONOSCOPY    . COLONOSCOPY WITH PROPOFOL N/A 06/14/2014   Procedure: COLONOSCOPY WITH PROPOFOL;  Surgeon: Gatha Mayer, MD;  Location: [facility];  Service: Endoscopy;  Laterality: N/A;  . DIAGNOSTIC LAPAROSCOPY    . ESOPHAGOGASTRODUODENOSCOPY N/A 01/03/2013   Procedure: ESOPHAGOGASTRODUODENOSCOPY (EGD);  Surgeon: Arta Silence, MD;  Location: [facility]S;  Service: Endoscopy;  Laterality: N/A;  . ESOPHAGOGASTRODUODENOSCOPY N/A 01/03/2013   Procedure: ESOPHAGOGASTRODUODENOSCOPY (EGD);  Surgeon: Arta Silence, MD;  Location: [facility];  Service: Endoscopy;  Laterality: N/A;  . ESOPHAGOGASTRODUODENOSCOPY N/A 01/23/2013   Procedure: ESOPHAGOGASTRODUODENOSCOPY (EGD);  Surgeon: Lear Ng, MD;  Location: [facility];  Service: Endoscopy;  Laterality: N/A;  . ESOPHAGOGASTRODUODENOSCOPY Left 01/27/2013   Procedure: ESOPHAGOGASTRODUODENOSCOPY (EGD);  Surgeon: Lear Ng, MD;  Location:  [facility] OR;  Service: Endoscopy;  Laterality: Left;  . ESOPHAGOGASTRODUODENOSCOPY N/A 06/14/2014   Procedure: ESOPHAGOGASTRODUODENOSCOPY (EGD);  Surgeon: Gatha Mayer, MD;  Location: [facility];  Service: Endoscopy;  Laterality: N/A;  . ESOPHAGOSCOPY N/A 01/01/2013   Procedure: ESOPHAGOSCOPY;  Surgeon: Jeryl Columbia, MD;  Location: [facility];  Service: Endoscopy;  Laterality: N/A;  . GASTRIC BYPASS  1977   Reversed in 1992 and revision 1994  . HYDROCELE EXCISION  1996   x 2   . KNEE SURGERY Left 2004      A IV Location/Drains/Wounds Patient Lines/Drains/Airways Status   Active Line/Drains/Airways    Name:   Placement date:   Placement time:   Site:   Days:   Peripheral IV 01/20/19 Left Forearm   01/20/19    1116    Forearm   less than 1          Intake/Output Last 24 hours  Intake/Output Summary (Last 24 hours) at 01/20/2019 1917 Last data filed at 01/20/2019 1735 Gross per 24 hour  Intake 50 ml  Output -  Net 50 ml    Labs/Imaging Results for orders placed or performed during the hospital encounter of 01/20/19 (from the past 48 hour(s))  Comprehensive metabolic panel     Status: Abnormal   Collection Time: 01/20/19 11:14 AM  Result Value Ref Range   Sodium 134 (L) 135 - 145 mmol/L   Potassium 4.3 3.5 - 5.1 mmol/L   Chloride 98 98 - 111 mmol/L   CO2 22 22 - 32 mmol/L   Glucose, Bld 258 (H) 70 - 99 mg/dL   BUN 14 8 - 23 mg/dL   Creatinine, Ser 0.92 0.61 - 1.24 mg/dL   Calcium 9.1 8.9 - 10.3 mg/dL   Total Protein 7.3 6.5 - 8.1 g/dL   Albumin 3.6 3.5 - 5.0 g/dL   AST 24 15 - 41 U/L   ALT 24 0 - 44 U/L   Alkaline Phosphatase 58 38 - 126 U/L   Total Bilirubin 0.8 0.3 - 1.2 mg/dL   GFR calc non Af Amer >60 >60 mL/min   GFR calc Af Amer >60 >60 mL/min   Anion gap 14 5 - 15    Comment: Performed at [facility], [address] [place], [place] [address]  Magnesium     Status: None   Collection Time: 01/20/19 11:14 AM  Result Value Ref Range   Magnesium 1.8 1.7 - 2.4 mg/dL    Comment: Performed at [facility] [address], [place], [place] [address]  Troponin I (High Sensitivity)     Status: None   Collection Time: 01/20/19 11:14 AM  Result Value Ref Range   Troponin I (High Sensitivity) 6 <18 ng/L    Comment: (NOTE) Elevated high sensitivity troponin I (hsTnI) values and significant  changes across serial measurements may suggest ACS but many other  chronic and acute conditions are known to elevate hsTnI results.  Refer to the "Links" section for  chest pain algorithms and additional  guidance. Performed at [facility], [place] [address] [place], [place] [address]   Brain natriuretic peptide (order if patient c/o SOB ONLY)     Status: None   Collection Time: 01/20/19 11:14 AM  Result Value Ref Range   B Natriuretic Peptide 65.4 0.0 - 100.0 pg/mL    Comment: Performed at [facility] [address] [place], [place] [address]  CBC with Differential/Platelet  Status: Abnormal   Collection Time: 01/20/19 11:14 AM  Result Value Ref Range   WBC 10.8 (H) 4.0 - 10.5 K/uL   RBC 4.95 4.22 - 5.81 MIL/uL   Hemoglobin 14.1 13.0 - 17.0 g/dL   HCT 44.4 39.0 - 52.0 %   MCV 89.7 80.0 - 100.0 fL   MCH 28.5 26.0 - 34.0 pg   MCHC 31.8 30.0 - 36.0 g/dL   RDW 15.6 (H) 11.5 - 15.5 %   Platelets 190 150 - 400 K/uL   nRBC 0.0 0.0 - 0.2 %   Neutrophils Relative % 82 %   Neutro Abs 8.9 (H) 1.7 - 7.7 K/uL   Lymphocytes Relative 10 %   Lymphs Abs 1.0 0.7 - 4.0 K/uL   Monocytes Relative 7 %   Monocytes Absolute 0.7 0.1 - 1.0 K/uL   Eosinophils Relative 1 %   Eosinophils Absolute 0.1 0.0 - 0.5 K/uL   Basophils Relative 0 %   Basophils Absolute 0.0 0.0 - 0.1 K/uL   Immature Granulocytes 0 %   Abs Immature Granulocytes 0.03 0.00 - 0.07 K/uL    Comment: Performed at [facility], [address], [place], [place] [address]  Protime-INR     Status: None   Collection Time: 01/20/19 11:14 AM  Result Value Ref Range   Prothrombin Time 12.8 11.4 - 15.2 seconds   INR 1.0 0.8 - 1.2    Comment: (NOTE) INR goal varies based on device and disease states. Performed at [facility], [place] [address] [place], [place] [address]   Lipase, blood     Status: None   Collection Time: 01/20/19 11:14 AM  Result Value Ref Range   Lipase 31 11 - 51 U/L    Comment: Performed at [facility] [address] [place], [place] [address]  Troponin I (High Sensitivity)     Status: None   Collection Time: 01/20/19 12:25 PM  Result Value  Ref Range   Troponin I (High Sensitivity) 6 <18 ng/L    Comment: (NOTE) Elevated high sensitivity troponin I (hsTnI) values and significant  changes across serial measurements may suggest ACS but many other  chronic and acute conditions are known to elevate hsTnI results.  Refer to the "Links" section for chest pain algorithms and additional  guidance. Performed at [facility], [place] [address] [place], [place] [address]   CBG monitoring, ED     Status: Abnormal   Collection Time: 01/20/19 12:27 PM  Result Value Ref Range   Glucose-Capillary 237 (H) 70 - 99 mg/dL   Ct Abdomen Pelvis W Contrast  Result Date: 01/20/2019 CLINICAL DATA:  Abdominal pain EXAM: CT ABDOMEN AND PELVIS WITH CONTRAST TECHNIQUE: Multidetector CT imaging of the abdomen and pelvis was performed using the standard protocol following bolus administration of intravenous contrast. CONTRAST:  169mL OMNIPAQUE IOHEXOL 300 MG/ML  SOLN COMPARISON:  None. FINDINGS: Lower chest: Subsegmental atelectasis. Hepatobiliary: Stable cyst of the left hepatic lobe. Gallbladder distention is unchanged. Pancreas: Unremarkable. No pancreatic ductal dilatation or surrounding inflammatory changes. Spleen: Unremarkable within limitation of streak artifact surgical clips. Adrenals/Urinary Tract: Adrenals are unremarkable. Small right renal cyst. Partially distended bladder is unremarkable. Stomach/Bowel: Postoperative changes of gastric bypass and reversal per medical record. Colonic diverticulosis without evidence of diverticulitis. Vascular/Lymphatic: No adenopathy. Reproductive: Unremarkable. Other: No ascites. Musculoskeletal: Postoperative changes of posterior lumbar spine fusion. Advanced multilevel degenerative changes. IMPRESSION: No findings to account for reported symptoms. Colonic diverticulosis without evidence of  diverticulitis. Electronically Signed   By: Macy Mis M.D.   On: 01/20/2019 16:26   Dg Chest Portable 1 View  Result  Date: 01/20/2019 CLINICAL DATA:  Chest pain. EXAM: PORTABLE CHEST 1 VIEW COMPARISON:  07/30/2015. FINDINGS: Patient's head obscures the superior mediastinum. Trachea is midline. Heart is enlarged and accentuated by AP technique. Minimal streaky atelectasis or scarring in the left lung base. No airspace consolidation or pleural fluid. IMPRESSION: Streaky atelectasis or scarring in the lower left hemithorax. Electronically Signed   By: Lorin Picket M.D.   On: 01/20/2019 10:51    Pending Labs Unresulted Labs (From admission, onward)    Start     Ordered   01/20/19 1812  SARS CORONAVIRUS 2 (TAT 6-24 HRS) Nasopharyngeal Nasopharyngeal Swab  (Asymptomatic/Tier 2 Patients Labs)  Once,   STAT    Question Answer Comment  Is this test for diagnosis or screening Screening   Symptomatic for COVID-19 as defined by CDC No   Hospitalized for COVID-19 No   Admitted to ICU for COVID-19 No   Previously tested for COVID-19 Yes   Resident in a congregate (group) care setting No   Employed in healthcare setting No      01/20/19 1812   Signed and Held  Hemoglobin A1c  Once,   R    Comments: To assess prior glycemic control    Signed and Held   Signed and Held  HIV Antibody (routine testing w rflx)  (HIV Antibody (Routine testing w reflex) panel)  Once,   R     Signed and Held   Signed and Held  TSH  Once,   R     Signed and Held          Vitals/Pain Today's Vitals   01/20/19 1700 01/20/19 1709 01/20/19 1824 01/20/19 1830  BP: (!) 199/60 (!) 214/99 (!) 184/99   Pulse:      Resp: 16 (!) 27 (!) 24   Temp:      TempSrc:      SpO2:      Weight:      Height:      PainSc:    10-Worst pain ever    Isolation Precautions No active isolations  Medications Medications  torsemide (DEMADEX) tablet 100 mg (100 mg Oral Given 01/20/19 1848)  acetaminophen (TYLENOL) CR tablet 1,300 mg (has no administration in time range)  atorvastatin (LIPITOR) tablet 40 mg (40 mg Oral Given 01/20/19 1847)  lisinopril  (ZESTRIL) tablet 40 mg (0 mg Oral Hold 01/20/19 1856)  metoprolol tartrate (LOPRESSOR) tablet 50 mg (has no administration in time range)  torsemide (DEMADEX) tablet 100 mg (has no administration in time range)  DULoxetine (CYMBALTA) DR capsule 60 mg (has no administration in time range)  traZODone (DESYREL) tablet 300 mg (has no administration in time range)  Insulin Glargine (LANTUS) Solostar Pen 20 Units (has no administration in time range)  pantoprazole (PROTONIX) EC tablet 20 mg (has no administration in time range)  polyethylene glycol (MIRALAX / GLYCOLAX) packet 17 g (has no administration in time range)  gabapentin (NEURONTIN) capsule 300 mg (has no administration in time range)  thiamine (B-1) injection 100 mg (100 mg Intravenous Given 01/20/19 1848)  simethicone (MYLICON) chewable tablet 80 mg (80 mg Oral Given 01/20/19 1847)  bisacodyl (DULCOLAX) suppository 10 mg (has no administration in time range)  morphine 2 MG/ML injection 2 mg (has no administration in time range)  aspirin chewable tablet 324 mg (324 mg Oral Given 01/20/19 1110)  ondansetron (ZOFRAN) injection 4 mg (4 mg Intravenous Given 01/20/19 1109)  fentaNYL (SUBLIMAZE) injection 50 mcg (50 mcg Intravenous Given 01/20/19 1110)  fentaNYL (SUBLIMAZE) injection 50 mcg (50 mcg Intravenous Given 01/20/19 1249)  HYDROmorphone (DILAUDID) injection 1 mg (1 mg Intravenous Given 01/20/19 1416)  iohexol (OMNIPAQUE) 300 MG/ML solution 100 mL (100 mLs Intravenous Contrast Given 01/20/19 1604)  sucralfate (CARAFATE) 1 GM/10ML suspension 1 g (1 g Oral Given 01/20/19 1731)  famotidine (PEPCID) IVPB 20 mg premix (0 mg Intravenous Stopped 01/20/19 1735)  labetalol (NORMODYNE) injection 10 mg (10 mg Intravenous Given 01/20/19 1706)  ondansetron (ZOFRAN) injection 4 mg (4 mg Intravenous Given 01/20/19 1706)  lisinopril (ZESTRIL) tablet 40 mg (40 mg Oral Given 01/20/19 1708)  HYDROmorphone (DILAUDID) injection 1 mg (1 mg Intravenous Given 01/20/19 1820)     Mobility walks Low fall risk   Focused Assessments Cardiac Assessment Handoff:    Lab Results  Component Value Date   CKTOTAL 107 09/15/2013   CKMB 3.0 05/09/2011   TROPONINI <0.30 09/16/2013   No results found for: DDIMER Does the Patient currently have chest pain? No     R Recommendations: See Admitting Provider Note  Report given to:   Additional Notes:

## 2019-01-20 NOTE — ED Notes (Signed)
Jocelyn Lamer [phone]

## 2019-01-20 NOTE — ED Notes (Addendum)
Pt transported to CT ?

## 2019-01-20 NOTE — ED Provider Notes (Signed)
Brief signout note  68 year old male  4:04 PM received signout from [place] - lower right chest pain, epigastric abdominal pain. EKG nonischemic, trop x2 wnl. CT abd pelvis negative. Still having significant pain and hypertension. Given home antihypertensives and additional pain medicine and pepcid.  Plan is to reassess pain control, BP and abdominal exam.   6:00 PM patient still having severe pain, abd has tenderness in RUQ, epigastrum; negative murphy's; no rebound or guarding. Given persistence of pain, will consult hospitalist for admission for observation and pain control  6:20 PM discussed with hospitalist, will admit, requests discuss with surgery - Dr. Laren Everts will admit  6:26 PM reviewed chart further, per note from [facility] in 2014 - "Phillip Maldonado is an 68 y.o. male who was recently admitted and treated for an upper GI bleed. I spoke with the [facility]  about him last week after he endoscoped him.  The anatomy was very confusing but appears at [name] has had 3 surgeries. In the 70s he had a stapling procedure by Dr. Milly Jakob at [facility] and then had revisional surgery at [facility] and then in 1996 had bypass surgery by Stark Klein at [facility] in [place].  A. Sensing and had some problems with multiple back surgeries and a recent marginal ulcer with GI bleeding." Will consult general surgery to see for further recommendations given severity of pain and surgical hx.   7:23 PM  Reviewed case with Dr. Johney Maine with general surgery. Given normal CT scan today, patient unlikely to have any acute surgical needs or acute surgical complications. He requests that the surgery team be contacted in the morning if a formal consult is still desired tomorrow. Updated hospitalist.     Lucrezia Starch, MD 01/20/19 Kathyrn Drown

## 2019-01-20 NOTE — ED Triage Notes (Signed)
Pt here with [facility] with diffuse belly pain and lower chest pain since 430.  Pt is nauseated.  EMS RBBB. Hx of CHF DM and HTN.

## 2019-01-20 NOTE — ED Provider Notes (Signed)
[facility] EMERGENCY DEPARTMENT Provider Note   CSN: [id number] Arrival date & time: 01/20/19  1016     History   Chief Complaint No chief complaint on file.   HPI Phillip Maldonado is a 68 y.o. male.     HPI Patient presents with chest pain, upper abdominal pain. Patient has multiple medical issues including CKD, CHF, A. fib.  He has seemingly been taking his medication as directed. He notes that without clear precipitant, about 4 hours prior to ED arrival the patient developed upper abdominal pain, described as sore, tight, severe.  There was soon thereafter also sternal discomfort. No new dyspnea, no fever.  On there is nausea, and he is vomiting on arrival to the emergency department. EMS providers accompanied the patient, and provide additional historical details, and note that he was hypertensive in route, with EKG showing right bundle branch block, rate 69. It is unclear if he received any medication in route. Currently describes pain as severe, 7/10 in both the sternum and upper abdomen, nonradiating.   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 06-04-14 "doesn't use much now, no mask/tubing now.    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   Depression, major 04/24/2015   Osteoarthritis of both knees 12/26/2014   Neuropathy, median nerve 09/11/2014   Allergic rhinitis 08/29/2014   Polydipsia 08/29/2014   Hereditary and idiopathic peripheral neuropathy 08/15/2014   Pain in joint, shoulder region 08/15/2014   Edema 06/20/2014   Fundic gland polyps of stomach, benign    Colon cancer screening    Anemia 05/16/2014   Intertrigo 04/11/2014   B12 deficiency anemia 04/11/2014   Rash and nonspecific skin eruption 04/11/2014   DM type 2, uncontrolled, 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   PVD (peripheral vascular disease) ([place]) 12/26/2013   Congestive heart disease ([place]) 12/26/2013   Facet arthropathy, lumbar 09/26/2013   Chronic pain syndrome 09/16/2013   Atrial fibrillation with RVR ([place]) 01/24/2013   History of gastric bypass 01/02/2013  Rotator cuff tear arthropathy 12/19/2012   HTN (hypertension) 09/18/2011   Hypercholesterolemia 09/18/2011   OSA (obstructive sleep apnea) 09/18/2011   Constipation 05/09/2011    Past Surgical History:  Procedure Laterality Date   ABDOMINAL SURGERY     [id number]   BACK SURGERY     x 8 -,multiple fusions(cervical to lumbar)   COLONOSCOPY     COLONOSCOPY WITH PROPOFOL N/A 06/14/2014   Procedure: COLONOSCOPY WITH PROPOFOL;  Surgeon: Gatha Mayer, MD;  Location: [facility] END[facility]COPY;  Service: Endoscopy;   Laterality: N/A;   DIAGNOSTIC LAPAROSCOPY     ESOPHAGOGASTRODUODENOSCOPY N/A 01/03/2013   Procedure: ESOPHAGOGASTRODUODENOSCOPY (EGD);  Surgeon: Arta Silence, MD;  Location: [facility] OR[facility];  Service: Endoscopy;  Laterality: N/A;   ESOPHAGOGASTRODUODENOSCOPY N/A 01/03/2013   Procedure: ESOPHAGOGASTRODUODENOSCOPY (EGD);  Surgeon: Arta Silence, MD;  Location: [facility];  Service: Endoscopy;  Laterality: N/A;   ESOPHAGOGASTRODUODENOSCOPY N/A 01/23/2013   Procedure: ESOPHAGOGASTRODUODENOSCOPY (EGD);  Surgeon: Lear Ng, MD;  Location: [facility] END[facility]COPY;  Service: Endoscopy;  Laterality: N/A;   ESOPHAGOGASTRODUODENOSCOPY Left 01/27/2013   Procedure: ESOPHAGOGASTRODUODENOSCOPY (EGD);  Surgeon: Lear Ng, MD;  Location: [facility];  Service: Endoscopy;  Laterality: Left;   ESOPHAGOGASTRODUODENOSCOPY N/A 06/14/2014   Procedure: ESOPHAGOGASTRODUODENOSCOPY (EGD);  Surgeon: Gatha Mayer, MD;  Location: [facility];  Service: Endoscopy;  Laterality: N/A;   ESOPHAGOSCOPY N/A 01/01/2013   Procedure: ESOPHAGOSCOPY;  Surgeon: Jeryl Columbia, MD;  Location: [facility];  Service: Endoscopy;  Laterality: N/A;   GASTRIC BYPASS  1977   Reversed in 1992 and revision Gypsy   x 2    KNEE SURGERY Left 2004        Home Medications    Prior to Admission medications   Medication Sig Start Date End Date Taking? Authorizing Provider  acetaminophen (TYLENOL) 650 MG CR tablet Take 2 tablets (1,300 mg total) by mouth 2 (two) times daily. 10/01/16   [name], NP  atorvastatin (LIPITOR) 40 MG tablet Take 1 tablet (40 mg total) by mouth daily. 02/22/17   [name], NP  DULoxetine (CYMBALTA) 60 MG capsule TAKE 1 CAPSULE(60 MG) BY MOUTH TWICE DAILY 02/22/17   [name], NP  gabapentin (NEURONTIN) 300 MG capsule Take One to Two tablets by mouth three times daily as needed for pain. 02/22/17   [name], NP  glucose blood (ACCU-CHEK AVIVA PLUS) test  strip 1 each by Other route See admin instructions. Check blood sugar twice daily    [provider]  Insulin Glargine (LANTUS SOLOSTAR) 100 UNIT/ML Solostar Pen inject 22-24 UNITS EVERY MORNING 02/22/17   [name], NP  lisinopril (PRINIVIL,ZESTRIL) 40 MG tablet TAKE 1 TABLET(40 MG) BY MOUTH DAILY 12/31/16   [name], NP  metFORMIN (GLUCOPHAGE) 1000 MG tablet Take one tablet by mouth twice daily with a meal to control blood sugar 01/22/17   [name], NP  methadone (DOLOPHINE) 10 MG tablet Take 1 tablet (10 mg total) by mouth every 8 (eight) hours as needed. 01/14/17   Reed, Tiffany L, DO  metolazone (ZAROXOLYN) 2.5 MG tablet One daily to help reduce edema 02/22/17   [name], NP  metoprolol tartrate (LOPRESSOR) 50 MG tablet TAKE 1 TABLET(50 MG) BY MOUTH TWICE DAILY 02/22/17   [name], NP  Multiple Vitamin (MULITIVITAMIN WITH MINERALS) TABS Take 1 tablet by mouth daily.    [provider]  naloxegol oxalate (MOVANTIK) 25 MG TABS tablet Take 1 tablet (  25 mg total) by mouth daily. 02/22/17   [name], NP  nystatin cream (MYCOSTATIN) Apply to yeast dermatitis in groin daily 01/20/17   [name], NP  pantoprazole (PROTONIX) 40 MG tablet TAKE 1 TABLET(40 MG) BY MOUTH TWICE DAILY 02/22/17   [name], NP  polyethylene glycol (MIRALAX / GLYCOLAX) packet Take 17 g by mouth daily as needed for mild constipation or moderate constipation.    [provider]  torsemide (DEMADEX) 100 MG tablet Take 1 tablet (100 mg total) by mouth 2 (two) times daily. 11/03/16   [name], NP  traZODone (DESYREL) 100 MG tablet TAKE 3 TABLETS BY MOUTH AT BEDTIME FOR REST 02/22/17   [name], NP  triamcinolone cream (KENALOG) 0.1 % Apply sparingly to rash up to twice daily 01/20/17   [name], NP    Family History Family History  Problem Relation Age of Onset   Heart disease Father    Skin cancer  Father    COPD Father    Hypertension Father    Stroke Father    Arthritis Mother    Osteoporosis Mother    Diabetes Daughter    Depression Son    Heart disease Maternal Uncle    High blood pressure Sister    High blood pressure Sister    High blood pressure Son    Depression Son    Arthritis Sister    Arthritis Sister    Cancer Maternal Grandmother    Heart disease Maternal Uncle    Heart disease Paternal Uncle     Social History Social History   Tobacco Use   Smoking status: Never Smoker   Smokeless tobacco: Never Used  Substance Use Topics   Alcohol use: No    Alcohol/week: 0.0 standard drinks   Drug use: No     Allergies   Adhesive [tape], Feldene [piroxicam], and Latex   Review of Systems Review of Systems  Constitutional:       Per HPI, otherwise negative  HENT:       Per HPI, otherwise negative  Respiratory:       Per HPI, otherwise negative  Cardiovascular:       Per HPI, otherwise negative  Gastrointestinal: Positive for nausea and vomiting.  Endocrine:       Negative aside from HPI  Genitourinary:       Neg aside from HPI   Musculoskeletal:       Per HPI, otherwise negative  Skin: Negative.   Neurological: Negative for syncope.     Physical Exam Updated Vital Signs BP (!) 209/103    Pulse 97    Temp 99.2 F (37.3 C) (Oral)    Resp (!) 25    Ht 5\' 9"  (1.753 m)    Wt (!) 152 kg    SpO2 93%    BMI 49.47 kg/m   Physical Exam Vitals signs and nursing note reviewed.  Constitutional:      Appearance: He is well-developed. He is obese. He is ill-appearing and diaphoretic.  HENT:     Head: Normocephalic and atraumatic.  Eyes:     Conjunctiva/sclera: Conjunctivae normal.  Cardiovascular:     Rate and Rhythm: Normal rate and regular rhythm.  Pulmonary:     Effort: Pulmonary effort is normal. No respiratory distress.     Breath sounds: No stridor.  Abdominal:     General: There is no distension.     Tenderness: There  is abdominal tenderness.  There is guarding.     Comments: Tenderness about the epigastrium  Skin:    General: Skin is warm.  Neurological:     Mental Status: He is alert and oriented to person, place, and time.      ED Treatments / Results  Labs (all labs ordered are listed, but only abnormal results are displayed) Labs Reviewed  COMPREHENSIVE METABOLIC PANEL - Abnormal; Notable for the following components:      Result Value   Sodium 134 (*)    Glucose, Bld 258 (*)    All other components within normal limits  CBC WITH DIFFERENTIAL/PLATELET - Abnormal; Notable for the following components:   WBC 10.8 (*)    RDW 15.6 (*)    Neutro Abs 8.9 (*)    All other components within normal limits  CBG MONITORING, ED - Abnormal; Notable for the following components:   Glucose-Capillary 237 (*)    All other components within normal limits  MAGNESIUM  BRAIN NATRIURETIC PEPTIDE  PROTIME-INR  LIPASE, BLOOD  TROPONIN I (HIGH SENSITIVITY)  TROPONIN I (HIGH SENSITIVITY)    EMS rhythm strip rate 69, right bundle branch block, T wave inversions inferiorly, abnormal rhythm strip  EKG EKG Interpretation  Date/Time:  Friday January 20 2019 10:31:30 EST Ventricular Rate:  72 PR Interval:    QRS Duration: 143 QT Interval:  431 QTC Calculation: 472 R Axis:   -54 Text Interpretation: Sinus rhythm RBBB and LAFB No significant change since last tracing Abnormal ECG Confirmed by Carmin Muskrat [phone]) on 01/20/2019 11:44:43 AM   Radiology Ct Abdomen Pelvis W Contrast  Result Date: 01/20/2019 CLINICAL DATA:  Abdominal pain EXAM: CT ABDOMEN AND PELVIS WITH CONTRAST TECHNIQUE: Multidetector CT imaging of the abdomen and pelvis was performed using the standard protocol following bolus administration of intravenous contrast. CONTRAST:  132mL OMNIPAQUE IOHEXOL 300 MG/ML  SOLN COMPARISON:  None. FINDINGS: Lower chest: Subsegmental atelectasis. Hepatobiliary: Stable cyst of the left hepatic lobe.  Gallbladder distention is unchanged. Pancreas: Unremarkable. No pancreatic ductal dilatation or surrounding inflammatory changes. Spleen: Unremarkable within limitation of streak artifact surgical clips. Adrenals/Urinary Tract: Adrenals are unremarkable. Small right renal cyst. Partially distended bladder is unremarkable. Stomach/Bowel: Postoperative changes of gastric bypass and reversal per medical record. Colonic diverticulosis without evidence of diverticulitis. Vascular/Lymphatic: No adenopathy. Reproductive: Unremarkable. Other: No ascites. Musculoskeletal: Postoperative changes of posterior lumbar spine fusion. Advanced multilevel degenerative changes. IMPRESSION: No findings to account for reported symptoms. Colonic diverticulosis without evidence of diverticulitis. Electronically Signed   By: Macy Mis M.D.   On: 01/20/2019 16:26   Dg Chest Portable 1 View  Result Date: 01/20/2019 CLINICAL DATA:  Chest pain. EXAM: PORTABLE CHEST 1 VIEW COMPARISON:  07/30/2015. FINDINGS: Patient's head obscures the superior mediastinum. Trachea is midline. Heart is enlarged and accentuated by AP technique. Minimal streaky atelectasis or scarring in the left lung base. No airspace consolidation or pleural fluid. IMPRESSION: Streaky atelectasis or scarring in the lower left hemithorax. Electronically Signed   By: Lorin Picket M.D.   On: 01/20/2019 10:51    Procedures Procedures (including critical care time)  Medications Ordered in ED Medications  sucralfate (CARAFATE) 1 GM/10ML suspension 1 g (has no administration in time range)  famotidine (PEPCID) IVPB 20 mg premix (has no administration in time range)  labetalol (NORMODYNE) injection 10 mg (has no administration in time range)  ondansetron (ZOFRAN) injection 4 mg (has no administration in time range)  lisinopril (ZESTRIL) tablet 40 mg (has no administration in time range)  torsemide (DEMADEX) tablet 100 mg (has no administration in time range)    aspirin chewable tablet 324 mg (324 mg Oral Given 01/20/19 1110)  ondansetron (ZOFRAN) injection 4 mg (4 mg Intravenous Given 01/20/19 1109)  fentaNYL (SUBLIMAZE) injection 50 mcg (50 mcg Intravenous Given 01/20/19 1110)  fentaNYL (SUBLIMAZE) injection 50 mcg (50 mcg Intravenous Given 01/20/19 1249)  HYDROmorphone (DILAUDID) injection 1 mg (1 mg Intravenous Given 01/20/19 1416)  iohexol (OMNIPAQUE) 300 MG/ML solution 100 mL (100 mLs Intravenous Contrast Given 01/20/19 1604)     Initial Impression / Assessment and Plan / ED Course  I have reviewed the triage vital signs and the nursing notes.  Pertinent labs & imaging results that were available during my care of the patient were reviewed by me and considered in my medical decision making (see chart for details).    Repeat exam patient is accompanied by his wife. He continues to have nausea, retching.   Update:, Second troponin normal, blood pressure elevated compared to arrival. 4:51 PM We reviewed CT findings, labs, which are generally reassuring aside from persistent hyperglycemia, mild leukocytosis.  Patient is awake alert, afebrile, but has persistent nausea, epigastric pain. Patient notes that he has not been able to take his medication today secondary to nausea, pain. He is unclear whether he is consistent with his PPI regimen as well. With concern for ongoing discomfort, patient will receive home meds, IV antihypertensives, analgesia, antiemetics. Should the patient improve, with reassuring CT scan, 2 normal troponins, nonischemic EKG, he may be appropriate for discharge per Patient will require additional evaluation, monitoring, management, Dr. Roslynn Amble is aware of the patient.  Final Clinical Impressions(s) / ED Diagnoses   Final diagnoses:  Atypical chest pain  Bilious vomiting with nausea     Carmin Muskrat, MD 01/20/19 1653

## 2019-01-20 NOTE — H&P (Signed)
[facility]                                                                                    Patient Demographics  Phillip Maldonado, is a 68 y.o. male  CSN: [id number]  MRN: [id number]  DOB - 12/26/1950  Admit Date - 01/20/2019  Outpatient Primary MD for the patient is Patient, No Pcp Per   With History of -  Past Medical History:  Diagnosis Date  . Anemia   . Angina   . Anxiety   . Arthritis   . Atrial fibrillation with RVR ([place]) 01/24/2013  . Blood transfusion    last 10' 14- "GI bleed"  . CHF (congestive heart failure) ([place])   . CKD (chronic kidney disease) stage 3, GFR 30-59 ml/min 01/08/2016  . Depression   . Diabetes mellitus ([place]) 09/18/2011  . Diverticulosis   . DJD (degenerative joint disease)   . DM type 2, uncontrolled, with neuropathy ([place]) 12/26/2013  . Edema extremities    bilateral lower extremities-"weepy areas due to fluid retention"  . Fatty liver   . Fundic gland polyps of stomach, benign   . Headache(784.0)   . Hepatitis B   . History of alcohol abuse    last drink in 1993  . Hypertension   . Neuromuscular disorder ([place])   . 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 Obesity   . Obesity, morbid ([place]) 07/30/2015  . Pneumonia    not at present time  . Renal insufficiency   . Shortness of breath   . Sleep apnea, obstructive    does where bipap. 06-04-14 "doesn't use much now, no mask/tubing now.      Past Surgical History:  Procedure Laterality Date  . ABDOMINAL SURGERY     [phone]  . BACK SURGERY     x 8 -,multiple fusions(cervical to lumbar)  . COLONOSCOPY    . COLONOSCOPY WITH PROPOFOL N/A 06/14/2014   Procedure: COLONOSCOPY WITH PROPOFOL;  Surgeon: Gatha Mayer, MD;  Location: [facility] ENDOSCOPY;  Service: Endoscopy;  Laterality: N/A;  . DIAGNOSTIC LAPAROSCOPY    . ESOPHAGOGASTRODUODENOSCOPY N/A 01/03/2013   Procedure: ESOPHAGOGASTRODUODENOSCOPY (EGD);  Surgeon: Arta Silence, MD;  Location: [facility] ORS;   Service: Endoscopy;  Laterality: N/A;  . ESOPHAGOGASTRODUODENOSCOPY N/A 01/03/2013   Procedure: ESOPHAGOGASTRODUODENOSCOPY (EGD);  Surgeon: Arta Silence, MD;  Location: [facility];  Service: Endoscopy;  Laterality: N/A;  . ESOPHAGOGASTRODUODENOSCOPY N/A 01/23/2013   Procedure: ESOPHAGOGASTRODUODENOSCOPY (EGD);  Surgeon: Lear Ng, MD;  Location: [facility] ENDOSCOPY;  Service: Endoscopy;  Laterality: N/A;  . ESOPHAGOGASTRODUODENOSCOPY Left 01/27/2013   Procedure: ESOPHAGOGASTRODUODENOSCOPY (EGD);  Surgeon: Lear Ng, MD;  Location: [facility];  Service: Endoscopy;  Laterality: Left;  . ESOPHAGOGASTRODUODENOSCOPY N/A 06/14/2014   Procedure: ESOPHAGOGASTRODUODENOSCOPY (EGD);  Surgeon: Gatha Mayer, MD;  Location: [facility];  Service: Endoscopy;  Laterality: N/A;  . ESOPHAGOSCOPY N/A 01/01/2013   Procedure: ESOPHAGOSCOPY;  Surgeon: Jeryl Columbia, MD;  Location: [facility];  Service: Endoscopy;  Laterality: N/A;  . GASTRIC BYPASS  1977   Reversed in 1992 and revision 1994  . HYDROCELE EXCISION  1996   x 2   . KNEE  SURGERY Left 2004    in for   Epigastric pain  HPI  Phillip Maldonado  is a 68 y.o. 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 fib, chronic abdominal pain presenting with acute onset of epigastric pain radiating to the right upper chest associated with nausea vomiting and no diarrhea.  The pain was radiating to the right chest and the patient had full work-up in the emergency room including an EKG, troponins CT of abdomen and pelvis with no significant abnormalities noted.  Patient denies any history of blood in his urine or stools.  Patient had subjective fevers early in the morning at 430 when he wake up with the abdominal pain.  Patient is on chronic pain medications but taking MiraLAX as well. Patient denies any cough or shortness of breath. Patient was recently treated for upper GI bleed and  case discussed with Dr. Michail Sermon from GI. Work-up in the emergency room showed hyperglycemia otherwise most of his blood work is full and within the normal range when showed colonic diverticulosis    Review of Systems    In addition to the HPI above,  No Fever-chills, No Headache, No changes with Vision or hearing, No problems swallowing food or Liquids, No Chest pain, Cough or Shortness of Breath, No Blood in stool or Urine, No dysuria, No new skin rashes or bruises, No new joints pains-aches,  No new weakness, tingling, numbness in any extremity, No recent weight gain or loss, No polyuria, polydypsia or polyphagia, No significant Mental Stressors.  A full 10 point Review of Systems was done, except as stated above, all other Review of Systems were negative.   Social History Social History   Tobacco Use  . Smoking status: Never Smoker  . Smokeless tobacco: Never Used  Substance Use Topics  . Alcohol use: No    Alcohol/week: 0.0 standard drinks     Family History Family History  Problem Relation Age of Onset  . Heart disease Father   . Skin cancer Father   . COPD Father   . Hypertension Father   . Stroke Father   . Arthritis Mother   . Osteoporosis Mother   . Diabetes Daughter   . Depression Son   . Heart disease Maternal Uncle   . High blood pressure Sister   . High blood pressure Sister   . High blood pressure Son   . Depression Son   . Arthritis Sister   . Arthritis Sister   . Cancer Maternal Grandmother   . Heart disease Maternal Uncle   . Heart disease Paternal Uncle     Prior to Admission medications   Medication Sig Start Date End Date Taking? Authorizing Provider  acetaminophen (TYLENOL) 650 MG CR tablet Take 2 tablets (1,300 mg total) by mouth 2 (two) times daily. 10/01/16   [name], NP  atorvastatin (LIPITOR) 40 MG tablet Take 1 tablet (40 mg total) by mouth daily. 02/22/17   [name], NP  DULoxetine (CYMBALTA) 60 MG  capsule TAKE 1 CAPSULE(60 MG) BY MOUTH TWICE DAILY 02/22/17   [name], NP  gabapentin (NEURONTIN) 300 MG capsule Take One to Two tablets by mouth three times daily as needed for pain. 02/22/17   [name], NP  glucose blood (ACCU-CHEK AVIVA PLUS) test strip 1 each by Other route See admin instructions. Check blood sugar twice daily    [provider]  Insulin Glargine (LANTUS SOLOSTAR)  100 UNIT/ML Solostar Pen inject 22-24 UNITS EVERY MORNING 02/22/17   [name], NP  lisinopril (PRINIVIL,ZESTRIL) 40 MG tablet TAKE 1 TABLET(40 MG) BY MOUTH DAILY 12/31/16   [name], NP  metFORMIN (GLUCOPHAGE) 1000 MG tablet Take one tablet by mouth twice daily with a meal to control blood sugar 01/22/17   [name], NP  methadone (DOLOPHINE) 10 MG tablet Take 1 tablet (10 mg total) by mouth every 8 (eight) hours as needed. 01/14/17   Reed, Tiffany L, DO  metolazone (ZAROXOLYN) 2.5 MG tablet One daily to help reduce edema 02/22/17   [name], NP  metoprolol tartrate (LOPRESSOR) 50 MG tablet TAKE 1 TABLET(50 MG) BY MOUTH TWICE DAILY 02/22/17   [name], NP  Multiple Vitamin (MULITIVITAMIN WITH MINERALS) TABS Take 1 tablet by mouth daily.    [provider]  naloxegol oxalate (MOVANTIK) 25 MG TABS tablet Take 1 tablet (25 mg total) by mouth daily. 02/22/17   [name], NP  nystatin cream (MYCOSTATIN) Apply to yeast dermatitis in groin daily 01/20/17   [name], NP  pantoprazole (PROTONIX) 40 MG tablet TAKE 1 TABLET(40 MG) BY MOUTH TWICE DAILY 02/22/17   [name], NP  polyethylene glycol (MIRALAX / GLYCOLAX) packet Take 17 g by mouth daily as needed for mild constipation or moderate constipation.    [provider]  torsemide (DEMADEX) 100 MG tablet Take 1 tablet (100 mg total) by mouth 2 (two) times daily. 11/03/16   [name], NP  traZODone (DESYREL) 100 MG tablet TAKE 3 TABLETS BY MOUTH AT  BEDTIME FOR REST 02/22/17   [name], NP  triamcinolone cream (KENALOG) 0.1 % Apply sparingly to rash up to twice daily 01/20/17   [name], NP    Allergies  Allergen Reactions  . Adhesive [Tape] Other (See Comments)    Unknown paper tape ok to use   . Feldene [Piroxicam] Other (See Comments)    unknown  . Latex Other (See Comments)    Rash and itching    Physical Exam  Vitals  Blood pressure (!) 184/99, pulse 97, temperature 99.2 F (37.3 C), temperature source Oral, resp. rate (!) 24, height 5\' 9"  (1.753 m), weight (!) 152 kg, SpO2 93 %.  General appearance, well-developed, in acute pain HEENT no jaundice or pallor, no facial deviation oral thrush Neck supple, no neck vein distention noted Chest clear and resonant Heart normal S1-S2, no murmurs gallops or rubs Abdomen distended above the umbilicus with generalized tenderness around the epigastric area.  Surgical site looks well-healed Extremities no clubbing or cyanosis 1+ edema in lower extremities noted Neuro gross nonfocal, patient moving all extremities  Data Review  CBC Recent Labs  Lab 01/20/19 1114  WBC 10.8*  HGB 14.1  HCT 44.4  PLT 190  MCV 89.7  MCH 28.5  MCHC 31.8  RDW 15.6*  LYMPHSABS 1.0  MONOABS 0.7  EOSABS 0.1  BASOSABS 0.0   ------------------------------------------------------------------------------------------------------------------  Chemistries  Recent Labs  Lab 01/20/19 1114  NA 134*  K 4.3  CL 98  CO2 22  GLUCOSE 258*  BUN 14  CREATININE 0.92  CALCIUM 9.1  MG 1.8  AST 24  ALT 24  ALKPHOS 58  BILITOT 0.8   ------------------------------------------------------------------------------------------------------------------ estimated creatinine clearance is 113.7 mL/min (by C-G formula based on SCr of 0.92 mg/dL). ------------------------------------------------------------------------------------------------------------------ No results for input(s): TSH,  T4TOTAL, T3FREE, THYROIDAB in the last 72 hours.  Invalid input(s): FREET3  Coagulation profile Recent Labs  Lab 01/20/19 1114  INR 1.0   ------------------------------------------------------------------------------------------------------------------- No results for input(s): DDIMER in the last 72 hours. -------------------------------------------------------------------------------------------------------------------  Cardiac Enzymes No results for input(s): CKMB, TROPONINI, MYOGLOBIN in the last 168 hours.  Invalid input(s): CK ------------------------------------------------------------------------------------------------------------------ Invalid input(s): POCBNP   ---------------------------------------------------------------------------------------------------------------  Urinalysis    Component Value Date/Time   COLORURINE YELLOW 09/15/2013 1855   APPEARANCEUR CLOUDY (A) 09/15/2013 1855   LABSPEC 1.020 09/15/2013 1855   PHURINE 7.0 09/15/2013 1855   GLUCOSEU NEGATIVE 09/15/2013 1855   HGBUR NEGATIVE 09/15/2013 1855   BILIRUBINUR Large 12/30/2015 1600   KETONESUR 15 (A) 09/15/2013 1855   PROTEINUR neg 12/30/2015 1600   PROTEINUR NEGATIVE 09/15/2013 1855   UROBILINOGEN 0.2 12/30/2015 1600   UROBILINOGEN 1.0 09/15/2013 1855   NITRITE negative 12/30/2015 1600   NITRITE NEGATIVE 09/15/2013 1855   LEUKOCYTESUR Negative 12/30/2015 1600    ----------------------------------------------------------------------------------------------------------------     Imaging results:   Ct Abdomen Pelvis W Contrast  Result Date: 01/20/2019 CLINICAL DATA:  Abdominal pain EXAM: CT ABDOMEN AND PELVIS WITH CONTRAST TECHNIQUE: Multidetector CT imaging of the abdomen and pelvis was performed using the standard protocol following bolus administration of intravenous contrast. CONTRAST:  170mL OMNIPAQUE IOHEXOL 300 MG/ML  SOLN COMPARISON:  None. FINDINGS: Lower chest: Subsegmental  atelectasis. Hepatobiliary: Stable cyst of the left hepatic lobe. Gallbladder distention is unchanged. Pancreas: Unremarkable. No pancreatic ductal dilatation or surrounding inflammatory changes. Spleen: Unremarkable within limitation of streak artifact surgical clips. Adrenals/Urinary Tract: Adrenals are unremarkable. Small right renal cyst. Partially distended bladder is unremarkable. Stomach/Bowel: Postoperative changes of gastric bypass and reversal per medical record. Colonic diverticulosis without evidence of diverticulitis. Vascular/Lymphatic: No adenopathy. Reproductive: Unremarkable. Other: No ascites. Musculoskeletal: Postoperative changes of posterior lumbar spine fusion. Advanced multilevel degenerative changes. IMPRESSION: No findings to account for reported symptoms. Colonic diverticulosis without evidence of diverticulitis. Electronically Signed   By: Macy Mis M.D.   On: 01/20/2019 16:26   Dg Chest Portable 1 View  Result Date: 01/20/2019 CLINICAL DATA:  Chest pain. EXAM: PORTABLE CHEST 1 VIEW COMPARISON:  07/30/2015. FINDINGS: Patient's head obscures the superior mediastinum. Trachea is midline. Heart is enlarged and accentuated by AP technique. Minimal streaky atelectasis or scarring in the left lung base. No airspace consolidation or pleural fluid. IMPRESSION: Streaky atelectasis or scarring in the lower left hemithorax. Electronically Signed   By: Lorin Picket M.D.   On: 01/20/2019 10:51    My personal review of EKG: Normal sinus rhythm with LAFB at 72 bpm and right bundle branch block  Assessment & Plan  Abdominal pain , NOS We will treat for constipation and gaseous distention Surgery on consult CT of abdomen negative To note is that the patient had gastric bypass in the 70s  Diabetes mellitus type 2 Continue with insulin sliding scale and Lantus  Chronic kidney disease with the last creatinine being 0.92  History of atrial fibrillation Now normal sinus rhythm  with rate controlled at 72 bpm  History of alcoholism and gastric bypass IV thiamine started  History of chronic pain Continue with methadone and as needed morphine    DVT Prophylaxis Lovenox  AM Labs Ordered, also please review Full Orders  Family Communication: Discussed with wife at bedside  Code Status full  Disposition Plan: Home  Time spent in minutes : 48 minutes  Condition GUARDED   @SIGNATURE @

## 2019-01-21 ENCOUNTER — Inpatient Hospital Stay (HOSPITAL_COMMUNITY): Payer: Medicare Other

## 2019-01-21 ENCOUNTER — Encounter (HOSPITAL_COMMUNITY): Payer: Self-pay | Admitting: Surgery

## 2019-01-21 DIAGNOSIS — E1169 Type 2 diabetes mellitus with other specified complication: Secondary | ICD-10-CM

## 2019-01-21 DIAGNOSIS — Z452 Encounter for adjustment and management of vascular access device: Secondary | ICD-10-CM

## 2019-01-21 DIAGNOSIS — A419 Sepsis, unspecified organism: Secondary | ICD-10-CM

## 2019-01-21 DIAGNOSIS — R6521 Severe sepsis with septic shock: Secondary | ICD-10-CM

## 2019-01-21 DIAGNOSIS — E669 Obesity, unspecified: Secondary | ICD-10-CM

## 2019-01-21 DIAGNOSIS — D72829 Elevated white blood cell count, unspecified: Secondary | ICD-10-CM

## 2019-01-21 LAB — URINALYSIS, ROUTINE W REFLEX MICROSCOPIC
Bilirubin Urine: NEGATIVE
Glucose, UA: NEGATIVE mg/dL
Hgb urine dipstick: NEGATIVE
Ketones, ur: NEGATIVE mg/dL
Leukocytes,Ua: NEGATIVE
Nitrite: NEGATIVE
Protein, ur: NEGATIVE mg/dL
Specific Gravity, Urine: 1.013 (ref 1.005–1.030)
pH: 5 (ref 5.0–8.0)

## 2019-01-21 LAB — COMPREHENSIVE METABOLIC PANEL
ALT: 26 U/L (ref 0–44)
ALT: 298 U/L — ABNORMAL HIGH (ref 0–44)
AST: 36 U/L (ref 15–41)
AST: 449 U/L — ABNORMAL HIGH (ref 15–41)
Albumin: 3.9 g/dL (ref 3.5–5.0)
Albumin: 3.4 g/dL — ABNORMAL LOW (ref 3.5–5.0)
Alkaline Phosphatase: 63 U/L (ref 38–126)
Alkaline Phosphatase: 116 U/L (ref 38–126)
Anion gap: 17 — ABNORMAL HIGH (ref 5–15)
Anion gap: 17 — ABNORMAL HIGH (ref 5–15)
BUN: 18 mg/dL (ref 8–23)
BUN: 27 mg/dL — ABNORMAL HIGH (ref 8–23)
CO2: 25 mmol/L (ref 22–32)
CO2: 24 mmol/L (ref 22–32)
Calcium: 9 mg/dL (ref 8.9–10.3)
Calcium: 8.6 mg/dL — ABNORMAL LOW (ref 8.9–10.3)
Chloride: 93 mmol/L — ABNORMAL LOW (ref 98–111)
Chloride: 95 mmol/L — ABNORMAL LOW (ref 98–111)
Creatinine, Ser: 1.18 mg/dL (ref 0.61–1.24)
Creatinine, Ser: 2.25 mg/dL — ABNORMAL HIGH (ref 0.61–1.24)
GFR calc Af Amer: >60 mL/min (ref >60)
GFR calc Af Amer: 34 mL/min — ABNORMAL LOW (ref >60)
GFR calc non Af Amer: >60 mL/min (ref >60)
GFR calc non Af Amer: 29 mL/min — ABNORMAL LOW (ref >60)
Glucose, Bld: 245 mg/dL — ABNORMAL HIGH (ref 70–99)
Glucose, Bld: 265 mg/dL — ABNORMAL HIGH (ref 70–99)
Potassium: 3.7 mmol/L (ref 3.5–5.1)
Potassium: 4.1 mmol/L (ref 3.5–5.1)
Sodium: 135 mmol/L (ref 135–145)
Sodium: 136 mmol/L (ref 135–145)
Total Bilirubin: 1.5 mg/dL — ABNORMAL HIGH (ref 0.3–1.2)
Total Bilirubin: 5.5 mg/dL — ABNORMAL HIGH (ref 0.3–1.2)
Total Protein: 8.2 g/dL — ABNORMAL HIGH (ref 6.5–8.1)
Total Protein: 7.1 g/dL (ref 6.5–8.1)

## 2019-01-21 LAB — PROTIME-INR
INR: 1.4 — ABNORMAL HIGH (ref 0.8–1.2)
Prothrombin Time: 16.8 seconds — ABNORMAL HIGH (ref 11.4–15.2)

## 2019-01-21 LAB — CBC WITH DIFFERENTIAL/PLATELET
Abs Immature Granulocytes: 0.16 10*3/uL — ABNORMAL HIGH (ref 0.00–0.07)
Abs Immature Granulocytes: 0.13 10*3/uL — ABNORMAL HIGH (ref 0.00–0.07)
Basophils Absolute: 0.1 10*3/uL (ref 0.0–0.1)
Basophils Absolute: 0 10*3/uL (ref 0.0–0.1)
Basophils Relative: 0 %
Basophils Relative: 0 %
Eosinophils Absolute: 0 10*3/uL (ref 0.0–0.5)
Eosinophils Absolute: 0.1 10*3/uL (ref 0.0–0.5)
Eosinophils Relative: 0 %
Eosinophils Relative: 0 %
HCT: 49 % (ref 39.0–52.0)
HCT: 43.1 % (ref 39.0–52.0)
Hemoglobin: 15.4 g/dL (ref 13.0–17.0)
Hemoglobin: 13.4 g/dL (ref 13.0–17.0)
Immature Granulocytes: 1 %
Immature Granulocytes: 1 %
Lymphocytes Relative: 9 %
Lymphocytes Relative: 8 %
Lymphs Abs: 1.9 10*3/uL (ref 0.7–4.0)
Lymphs Abs: 1.4 10*3/uL (ref 0.7–4.0)
MCH: 28.7 pg (ref 26.0–34.0)
MCH: 28.1 pg (ref 26.0–34.0)
MCHC: 31.4 g/dL (ref 30.0–36.0)
MCHC: 31.1 g/dL (ref 30.0–36.0)
MCV: 91.4 fL (ref 80.0–100.0)
MCV: 90.4 fL (ref 80.0–100.0)
Monocytes Absolute: 1.9 10*3/uL — ABNORMAL HIGH (ref 0.1–1.0)
Monocytes Absolute: 1.5 10*3/uL — ABNORMAL HIGH (ref 0.1–1.0)
Monocytes Relative: 9 %
Monocytes Relative: 8 %
Neutro Abs: 18.2 10*3/uL — ABNORMAL HIGH (ref 1.7–7.7)
Neutro Abs: 15 10*3/uL — ABNORMAL HIGH (ref 1.7–7.7)
Neutrophils Relative %: 81 %
Neutrophils Relative %: 83 %
Platelets: 279 10*3/uL (ref 150–400)
Platelets: 239 10*3/uL (ref 150–400)
RBC: 5.36 MIL/uL (ref 4.22–5.81)
RBC: 4.77 MIL/uL (ref 4.22–5.81)
RDW: 16.7 % — ABNORMAL HIGH (ref 11.5–15.5)
RDW: 16.8 % — ABNORMAL HIGH (ref 11.5–15.5)
WBC: 22.2 10*3/uL — ABNORMAL HIGH (ref 4.0–10.5)
WBC: 18.1 10*3/uL — ABNORMAL HIGH (ref 4.0–10.5)
nRBC: 0 % (ref 0.0–0.2)
nRBC: 0 % (ref 0.0–0.2)

## 2019-01-21 LAB — MAGNESIUM
Magnesium: 1.8 mg/dL (ref 1.7–2.4)
Magnesium: 1.7 mg/dL (ref 1.7–2.4)

## 2019-01-21 LAB — GLUCOSE, CAPILLARY
Glucose-Capillary: 237 mg/dL — ABNORMAL HIGH (ref 70–99)
Glucose-Capillary: 266 mg/dL — ABNORMAL HIGH (ref 70–99)
Glucose-Capillary: 319 mg/dL — ABNORMAL HIGH (ref 70–99)
Glucose-Capillary: 248 mg/dL — ABNORMAL HIGH (ref 70–99)
Glucose-Capillary: 228 mg/dL — ABNORMAL HIGH (ref 70–99)

## 2019-01-21 LAB — PHOSPHORUS
Phosphorus: 3.2 mg/dL (ref 2.5–4.6)
Phosphorus: 2.4 mg/dL — ABNORMAL LOW (ref 2.5–4.6)

## 2019-01-21 LAB — BLOOD GAS, ARTERIAL
Acid-Base Excess: 3.6 mmol/L — ABNORMAL HIGH (ref 0.0–2.0)
Bicarbonate: 27.3 mmol/L (ref 20.0–28.0)
Drawn by: 308601
O2 Saturation: 95.5 %
Patient temperature: 98.6
pCO2 arterial: 39.7 mmHg (ref 32.0–48.0)
pH, Arterial: 7.452 — ABNORMAL HIGH (ref 7.350–7.450)
pO2, Arterial: 80.3 mmHg — ABNORMAL LOW (ref 83.0–108.0)

## 2019-01-21 LAB — HEMOGLOBIN A1C
Hgb A1c MFr Bld: 8.6 % — ABNORMAL HIGH (ref 4.8–5.6)
Mean Plasma Glucose: 200.12 mg/dL

## 2019-01-21 LAB — RAPID URINE DRUG SCREEN, HOSP PERFORMED
Amphetamines: NOT DETECTED
Barbiturates: NOT DETECTED
Benzodiazepines: NOT DETECTED
Cocaine: NOT DETECTED
Opiates: POSITIVE — AB
Tetrahydrocannabinol: NOT DETECTED

## 2019-01-21 LAB — ACETAMINOPHEN LEVEL: Acetaminophen (Tylenol), Serum: <10 ug/mL — ABNORMAL LOW (ref 10–30)

## 2019-01-21 LAB — LACTIC ACID, PLASMA: Lactic Acid, Venous: 2.1 mmol/L (ref 0.5–1.9)

## 2019-01-21 LAB — HIV ANTIBODY (ROUTINE TESTING W REFLEX): HIV Screen 4th Generation wRfx: NONREACTIVE

## 2019-01-21 LAB — MRSA PCR SCREENING: MRSA by PCR: NEGATIVE

## 2019-01-21 LAB — CK: Total CK: 115 U/L (ref 49–397)

## 2019-01-21 LAB — TSH: TSH: 2.396 u[IU]/mL (ref 0.350–4.500)

## 2019-01-21 LAB — TROPONIN I (HIGH SENSITIVITY)
Troponin I (High Sensitivity): 16 ng/L (ref <18)
Troponin I (High Sensitivity): 16 ng/L (ref <18)

## 2019-01-21 LAB — LIPASE, BLOOD: Lipase: 1054 U/L — ABNORMAL HIGH (ref 11–51)

## 2019-01-21 LAB — AMYLASE: Amylase: 509 U/L — ABNORMAL HIGH (ref 28–100)

## 2019-01-21 MED ORDER — AMLODIPINE BESYLATE 5 MG PO TABS: 5.0000 mg | ORAL_TABLET | Freq: Every day | ORAL | Status: DC

## 2019-01-21 MED ORDER — DEXTROSE-NACL 2.5-0.45 % IV SOLN: INTRAVENOUS | Status: DC

## 2019-01-21 MED ORDER — NOREPINEPHRINE 4 MG/250ML-% IV SOLN
0.0000 ug/min | INTRAVENOUS | Status: DC
  Administered 2019-01-21: 2 ug/min via INTRAVENOUS
  Filled 2019-01-21: qty 250

## 2019-01-21 MED ORDER — SODIUM CHLORIDE 0.9 % IV BOLUS
1000.0000 mL | Freq: Once | INTRAVENOUS | Status: AC
  Administered 2019-01-21: 1000 mL via INTRAVENOUS

## 2019-01-21 MED ORDER — PANTOPRAZOLE SODIUM 40 MG PO TBEC
40.0000 mg | DELAYED_RELEASE_TABLET | Freq: Two times a day (BID) | ORAL | Status: DC
  Administered 2019-01-21: 40 mg via ORAL
  Filled 2019-01-21 (×2): qty 1

## 2019-01-21 MED ORDER — LACTATED RINGERS IV SOLN
INTRAVENOUS | Status: DC
  Administered 2019-01-21 – 2019-01-22 (×3): via INTRAVENOUS

## 2019-01-21 MED ORDER — DEXTROSE-NACL 5-0.45 % IV SOLN
INTRAVENOUS | Status: DC
  Administered 2019-01-21: 02:00:00 via INTRAVENOUS

## 2019-01-21 MED ORDER — PIPERACILLIN-TAZOBACTAM 3.375 G IVPB
3.3750 g | Freq: Three times a day (TID) | INTRAVENOUS | Status: DC
  Administered 2019-01-21 – 2019-01-23 (×6): 3.375 g via INTRAVENOUS
  Filled 2019-01-21 (×7): qty 50

## 2019-01-21 MED ORDER — LOSARTAN POTASSIUM 50 MG PO TABS: 100.0000 mg | ORAL_TABLET | Freq: Every day | ORAL | Status: DC

## 2019-01-21 MED ORDER — SODIUM CHLORIDE 0.9 % IV BOLUS
500.0000 mL | Freq: Once | INTRAVENOUS | Status: AC
  Administered 2019-01-21: 500 mL via INTRAVENOUS

## 2019-01-21 MED ORDER — CHLORHEXIDINE GLUCONATE CLOTH 2 % EX PADS
6.0000 | MEDICATED_PAD | Freq: Every day | CUTANEOUS | Status: DC
  Administered 2019-01-22 – 2019-02-08 (×17): 6 via TOPICAL

## 2019-01-21 MED ORDER — PREGABALIN 75 MG PO CAPS
150.0000 mg | ORAL_CAPSULE | Freq: Three times a day (TID) | ORAL | Status: DC
  Administered 2019-01-21: 150 mg via ORAL
  Filled 2019-01-21: qty 2

## 2019-01-21 MED ORDER — LEVALBUTEROL HCL 0.63 MG/3ML IN NEBU: 0.6300 mg | INHALATION_SOLUTION | Freq: Four times a day (QID) | RESPIRATORY_TRACT | Status: DC | PRN

## 2019-01-21 MED ORDER — VITAMIN B-1 100 MG PO TABS
100.0000 mg | ORAL_TABLET | Freq: Every day | ORAL | Status: DC
  Administered 2019-01-21: 100 mg via ORAL
  Filled 2019-01-21 (×2): qty 1

## 2019-01-21 MED ORDER — INFLUENZA VAC A&B SA ADJ QUAD 0.5 ML IM PRSY
0.5000 mL | PREFILLED_SYRINGE | INTRAMUSCULAR | Status: AC
  Administered 2019-01-23: 0.5 mL via INTRAMUSCULAR
  Filled 2019-01-21: qty 0.5

## 2019-01-21 MED ORDER — LEVALBUTEROL HCL 0.63 MG/3ML IN NEBU
0.6300 mg | INHALATION_SOLUTION | Freq: Four times a day (QID) | RESPIRATORY_TRACT | Status: DC
  Administered 2019-01-21: 0.63 mg via RESPIRATORY_TRACT
  Filled 2019-01-21 (×2): qty 3

## 2019-01-21 MED ORDER — INSULIN ASPART 100 UNIT/ML ~~LOC~~ SOLN
0.0000 [IU] | Freq: Every day | SUBCUTANEOUS | Status: DC
  Administered 2019-01-21 – 2019-01-23 (×3): 2 [IU] via SUBCUTANEOUS
  Administered 2019-01-28: 3 [IU] via SUBCUTANEOUS
  Administered 2019-01-30: 4 [IU] via SUBCUTANEOUS
  Administered 2019-01-31: 3 [IU] via SUBCUTANEOUS
  Administered 2019-02-02 – 2019-02-06 (×2): 2 [IU] via SUBCUTANEOUS

## 2019-01-21 MED ORDER — ENOXAPARIN SODIUM 80 MG/0.8ML ~~LOC~~ SOLN
80.0000 mg | SUBCUTANEOUS | Status: DC
  Administered 2019-01-21 – 2019-01-22 (×2): 80 mg via SUBCUTANEOUS
  Filled 2019-01-21 (×2): qty 0.8

## 2019-01-21 MED ORDER — LACTATED RINGERS IV BOLUS
500.0000 mL | Freq: Once | INTRAVENOUS | Status: AC
  Administered 2019-01-21: 500 mL via INTRAVENOUS

## 2019-01-21 MED ORDER — IPRATROPIUM BROMIDE 0.02 % IN SOLN
0.5000 mg | Freq: Four times a day (QID) | RESPIRATORY_TRACT | Status: DC
  Administered 2019-01-21: 20:00:00 0.5 mg via RESPIRATORY_TRACT
  Filled 2019-01-21 (×2): qty 2.5

## 2019-01-21 MED ORDER — INSULIN ASPART 100 UNIT/ML ~~LOC~~ SOLN
0.0000 [IU] | Freq: Three times a day (TID) | SUBCUTANEOUS | Status: DC
  Administered 2019-01-21: 3 [IU] via SUBCUTANEOUS
  Administered 2019-01-21: 7 [IU] via SUBCUTANEOUS
  Administered 2019-01-21: 5 [IU] via SUBCUTANEOUS
  Administered 2019-01-22 (×3): 2 [IU] via SUBCUTANEOUS
  Administered 2019-01-23: 13:00:00 1 [IU] via SUBCUTANEOUS
  Administered 2019-01-23: 2 [IU] via SUBCUTANEOUS
  Administered 2019-01-23: 1 [IU] via SUBCUTANEOUS
  Administered 2019-01-24: 12:00:00 2 [IU] via SUBCUTANEOUS
  Administered 2019-01-24: 09:00:00 1 [IU] via SUBCUTANEOUS
  Administered 2019-01-24: 3 [IU] via SUBCUTANEOUS
  Administered 2019-01-25: 2 [IU] via SUBCUTANEOUS
  Administered 2019-01-25 (×2): 3 [IU] via SUBCUTANEOUS
  Administered 2019-01-26 (×3): 2 [IU] via SUBCUTANEOUS
  Administered 2019-01-27: 3 [IU] via SUBCUTANEOUS
  Administered 2019-01-28 (×2): 2 [IU] via SUBCUTANEOUS
  Administered 2019-01-28 – 2019-01-29 (×3): 3 [IU] via SUBCUTANEOUS
  Administered 2019-01-29: 09:00:00 2 [IU] via SUBCUTANEOUS
  Administered 2019-01-30: 5 [IU] via SUBCUTANEOUS
  Administered 2019-01-30: 2 [IU] via SUBCUTANEOUS
  Administered 2019-01-30: 7 [IU] via SUBCUTANEOUS
  Administered 2019-01-31: 2 [IU] via SUBCUTANEOUS
  Administered 2019-01-31: 7 [IU] via SUBCUTANEOUS
  Administered 2019-01-31 – 2019-02-02 (×4): 2 [IU] via SUBCUTANEOUS
  Administered 2019-02-02: 18:00:00 5 [IU] via SUBCUTANEOUS
  Administered 2019-02-03: 08:00:00 2 [IU] via SUBCUTANEOUS
  Administered 2019-02-03: 12:00:00 3 [IU] via SUBCUTANEOUS
  Administered 2019-02-03: 5 [IU] via SUBCUTANEOUS
  Administered 2019-02-04: 13:00:00 2 [IU] via SUBCUTANEOUS
  Administered 2019-02-04: 3 [IU] via SUBCUTANEOUS
  Administered 2019-02-05: 2 [IU] via SUBCUTANEOUS
  Administered 2019-02-05: 3 [IU] via SUBCUTANEOUS
  Administered 2019-02-05: 2 [IU] via SUBCUTANEOUS
  Administered 2019-02-06 – 2019-02-07 (×3): 1 [IU] via SUBCUTANEOUS
  Administered 2019-02-07: 2 [IU] via SUBCUTANEOUS
  Administered 2019-02-08: 12:00:00 1 [IU] via SUBCUTANEOUS
  Administered 2019-02-08 – 2019-02-09 (×3): 2 [IU] via SUBCUTANEOUS
  Administered 2019-02-09 (×2): 1 [IU] via SUBCUTANEOUS
  Administered 2019-02-10: 12:00:00 2 [IU] via SUBCUTANEOUS

## 2019-01-21 MED ORDER — ONDANSETRON HCL 4 MG PO TABS: 4.0000 mg | ORAL_TABLET | Freq: Four times a day (QID) | ORAL | Status: DC | PRN

## 2019-01-21 MED ORDER — ENOXAPARIN SODIUM 40 MG/0.4ML ~~LOC~~ SOLN
40.0000 mg | SUBCUTANEOUS | Status: DC
  Administered 2019-01-21: 40 mg via SUBCUTANEOUS
  Filled 2019-01-21: qty 0.4

## 2019-01-21 MED ORDER — DULOXETINE HCL 60 MG PO CPEP
60.0000 mg | ORAL_CAPSULE | Freq: Every day | ORAL | Status: DC
  Administered 2019-01-24 – 2019-02-10 (×18): 60 mg via ORAL
  Filled 2019-01-21 (×11): qty 1
  Filled 2019-01-21: qty 2
  Filled 2019-01-21: qty 1
  Filled 2019-01-21: qty 2
  Filled 2019-01-21 (×4): qty 1
  Filled 2019-01-21 (×2): qty 2

## 2019-01-21 MED ORDER — METOPROLOL SUCCINATE ER 100 MG PO TB24: 100.0000 mg | ORAL_TABLET | Freq: Every day | ORAL | Status: DC

## 2019-01-21 MED ORDER — PANTOPRAZOLE SODIUM 40 MG IV SOLR
40.0000 mg | Freq: Two times a day (BID) | INTRAVENOUS | Status: DC
  Administered 2019-01-21: 40 mg via INTRAVENOUS
  Filled 2019-01-21: qty 40

## 2019-01-21 MED ORDER — TORSEMIDE 20 MG PO TABS: 50.0000 mg | ORAL_TABLET | Freq: Every day | ORAL | Status: DC

## 2019-01-21 MED ORDER — ONDANSETRON HCL 4 MG/2ML IJ SOLN
4.0000 mg | Freq: Four times a day (QID) | INTRAMUSCULAR | Status: DC | PRN
  Administered 2019-01-23 (×2): 4 mg via INTRAVENOUS
  Filled 2019-01-21 (×3): qty 2

## 2019-01-21 NOTE — Progress Notes (Signed)
MD paged regarding pt's hypotension. Bolus administered and meds adjusted per MD order.

## 2019-01-21 NOTE — Progress Notes (Signed)
PROGRESS NOTE    Phillip Maldonado  [name] DOB: September 29, 1950 DOA: 01/20/2019 PCP: Patient, No Pcp Per   Brief Narrative:  HPI per Dr. Merton Border on 01/20/2019  Phillip Maldonado  is a 68 y.o. 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 fib, chronic abdominal pain presenting with acute onset of epigastric pain radiating to the right upper chest associated with nausea vomiting and no diarrhea.  The pain was radiating to the right chest and the patient had full work-up in the emergency room including an EKG, troponins CT of abdomen and pelvis with no significant abnormalities noted.  Patient denies any history of blood in his urine or stools.  Patient had subjective fevers early in the morning at 430 when he wake up with the abdominal pain.  Patient is on chronic pain medications but taking MiraLAX as well. Patient denies any cough or shortness of breath. Patient was recently treated for upper GI bleed and case discussed with Dr. Michail Sermon from GI. Work-up in the emergency room showed hyperglycemia otherwise most of his blood work is full and within the normal range when showed colonic diverticulosis  **Interim History Patient was still complaining of abdominal pain and WBC worsened.  CT was unrevealing but will obtain a right upper quadrant ultrasound as well as an abdominal ultrasound.  General surgery was consulted for further evaluation recommendations  Assessment & Plan:   Active Problems:   Abdominal pain  Right upper quadrant abdominal Pain  with associated nausea and vomiting with concern for Acute Cholecystitis -Admitted to inpatient telemetry -On admission his WBC was 10.8 has now worsened to 22.2 -T bili was initially normal at 0.8 and is worsening to 1.5 -Does have a anion gap now but is not acidotic -CT of the Abdomen and Pelvis showed "No findings to account for reported symptoms. Colonic  diverticulosis without evidence of diverticulitis."  However the report did mention a distended gallbladder -CT Abd and Pelvis reviewd from 09/28/2018 note showed Cholelithiasis but no evidence of Acute Cholecystitis -We will obtain a right upper quadrant ultrasound -We will panculture given patient's history of subjective fevers and will obtain blood cultures x2 urinalysis and urine culture We will start empiric IV Zosyn given concern for infected gallbladder or cholecystitis -Patient is having pain on palpation on the right upper quadrant and had a positive Murphy sign on my examination -Surgery was consulted for further evaluation recommendations We will stop D5 normal saline at a rate of 50 mL's per hour we will start the patient on lactated Ringer's at 75 mL's per hour -Patient has had a gastric bypass in 1970s and recently evaluated for GI bleed by Dr. Michail Sermon of gastroenterology -Continue to treat for constipation and gaseous distention -Check a lipase level as well as an acute hepatitis panel -Placed on a clear liquid diet now and reduce diet from a soft diet -Patient was given 1 mg of IV hydromorphone yesterday and is now on IV morphine 2 mg every 8 as needed for severe pain; also has acetaminophen at 1000 g p.o. twice daily -Start empiric antibiotics with Zosyn continue with antiemetics with Zofran 4 mg p.o./IV every 6 as needed for nausea -Patient was also given some Carafate yesterday and also has simethicone 80 mg p.o. 4 times daily and has a bowel regimen of 17 g p.o. daily as needed MiraLAX  Hyperbilirubinemia -As above -Obtaining a an abdominal ultrasound  -Continue to monitor and trend as  patient's T bili went from 0.8 is now 1.4  -Repeat CMP in a.m.  Uncontrolled Diabetes Mellitus Type 2 complicated by Diabetic Neuropathy -Continue with gabapentin 300 mg p.o. 3 times daily as needed -Hold his Metformin 1000 g p.o. twice daily -His hemoglobin A1c was 8.6 -CBGs have been  ranging from 237-319;  -Continue with sensitive NovoLog sliding scale insulin before meals and at bedtime along with Lantus 20 units subcu daily at 10 PM -May need to consult diabetes education coordinator and will continue to monitor blood sugars carefully  Hypertensive Urgency -Likely in the setting of abdominal pain -Significantly elevated in the setting of pain and likely infection yesterday -Blood pressure is now improved and now on the lower side slightly 100/57 -Continued with lisinopril 40 mg p.o. daily, metoprolol 50 mg p.o. twice daily -Upon further review he has Amlodipine 5 mg p.o. daily and is unclear if he is still taking losartan but he also has losartan and lisinopril on his home MAR  GERD and Hx of GI Bleed -Given famotidine 20 mg IV once yesterday with pantoprazole 40 mg IV every 12  Chronic Kidney Disease Stage 3 -Stable  -BUN/creatinine went from 14/0.92 is now 18/1.18 -Avoid nephrotoxic medications if possible, contrast dyes as well as hypotension We will continue monitor and trend renal function; continue lisinopril 40 mg daily and torsemide has been cut in half from 100 mg p.o. twice daily to 100 mg p.o. daily -Changed D5 NS to LR at 75 mL/hr for now -Repeat CMP in a.m.  CHF -Unknown Type as unable to view ECHO in our system -Appears fairly compensated  -C/w lisinopril 40 mg p.o. daily, metoprolol 51 p.o. twice daily and torsemide 100 mg p.o. twice daily has been reduced down to 200 mg p.o. daily -Strict I's and O's and daily weights We will continue to monitor for signs and symptoms of volume overload as patient is getting IV fluid hydration with lactated Ringer's at 75 mL's per hour  HLD/Hypercholesterolemia -Continue with Atorvastatin 40 mg p.o. nightly  History of Atrial Fibrillation -TSH was 2.396 -Now normal sinus rhythm with rate controlled -Continue to monitor on telemetry  History of Alcoholism and Gastric Bypass -IV Thiamine started at 100 mg  q24h  History of Chronic Pain -Upon further review Patient no longer taking  Methadone 10 mg every 8 hours; -C/w as needed IV morphine 2 mg every 8 hours as needed -Continue with bowel regimen as above and patient also takes Movantik at home which was held  Morbid Obesity -Estimated body mass index is 49.47 kg/m as calculated from the following:   Height as of this encounter: 5\' 9"  (1.753 m).   Weight as of this encounter: 152 kg. -Weight Loss and Dietary Counseling given    DVT prophylaxis: Enoxaparin 40 mg subcu every 24 Code Status: FULL CODE Family Communication: No family present at bedside  Disposition Plan: Pending further improvement and evaluation by general surgery   Consultants:   General Surgery  Procedures:  None   Antimicrobials:  Anti-infectives (From admission, onward)   Start     Dose/Rate Route Frequency Ordered Stop   01/21/19 1400  piperacillin-tazobactam (ZOSYN) IVPB 3.375 g     3.375 g 12.5 mL/hr over 240 Minutes Intravenous Every 8 hours 01/21/19 1325       Subjective: Seen and examined at bedside and was still complaining some right upper quadrant abdominal pain.  Had some nausea but no vomiting.  Denies any lightheadedness or dizziness.  Still did  not feel very well.  No other concerns or complaints at this time.  Objective: Vitals:   01/20/19 2030 01/21/19 0315 01/21/19 0739 01/21/19 1135  BP: (!) 162/116 129/79 (!) 145/92 (!) 100/57  Pulse:  79 95 94  Resp: (!) 24 18 18 18   Temp:  99.1 F (37.3 C) 99.5 F (37.5 C) 99.3 F (37.4 C)  TempSrc:  Oral Oral Oral  SpO2:  95% 96% 96%  Weight:      Height:        Intake/Output Summary (Last 24 hours) at 01/21/2019 1309 Last data filed at 01/21/2019 [id number] Gross per 24 hour  Intake 280.14 ml  Output 450 ml  Net -169.86 ml   Filed Weights   01/20/19 1023  Weight: (!) 152 kg   Examination: Physical Exam:  Constitutional: WN/WD morbidly obese Caucasian male inNAD and appears calm but  does appear uncomfortable  Eyes: Lids and conjunctivae normal, sclerae anicteric  ENMT: External Ears, Nose appear normal. Grossly normal hearing. Mucous membranes are moist.  Neck: Appears normal, supple, no cervical masses, normal ROM, no appreciable thyromegaly; no JVD Respiratory: Diminished to auscultation bilaterally, no wheezing, rales, rhonchi or crackles. Normal respiratory effort and patient is not tachypenic. No accessory muscle use.  Cardiovascular: RRR, no murmurs / rubs / gallops. S1 and S2 auscultated. 1+ LE extremity edema Abdomen: Soft, Tender to palpate and worse in the RUQ, Distended due to body habitus and has a large pannus. Bowel sounds positive x4.  GU: Deferred. Musculoskeletal: No clubbing / cyanosis of digits/nails. No joint deformity upper and lower extremities. Skin: No rashes, lesions, ulcers on a limited skin evaluation. No induration; Warm and dry.  Neurologic: CN 2-12 grossly intact with no focal deficits. Romberg sign and cerebellar reflexes not assessed.  Psychiatric: Normal judgment and insight. Alert and oriented x 3. Normal mood and appropriate affect.   Data Reviewed: I have personally reviewed following labs and imaging studies  CBC: Recent Labs  Lab 01/20/19 1114 01/21/19 0827  WBC 10.8* 22.2*  NEUTROABS 8.9* 18.2*  HGB 14.1 15.4  HCT 44.4 49.0  MCV 89.7 91.4  PLT 190 [id number]   Basic Metabolic Panel: Recent Labs  Lab 01/20/19 1114 01/21/19 0827  NA 134* 135  K 4.3 3.7  CL 98 93*  CO2 22 25  GLUCOSE 258* 245*  BUN 14 18  CREATININE 0.92 1.18  CALCIUM 9.1 9.0  MG 1.8 1.8  PHOS  --  3.2   GFR: Estimated Creatinine Clearance: 88.7 mL/min (by C-G formula based on SCr of 1.18 mg/dL). Liver Function Tests: Recent Labs  Lab 01/20/19 1114 01/21/19 0827  AST 24 36  ALT 24 26  ALKPHOS 58 63  BILITOT 0.8 1.5*  PROT 7.3 8.2*  ALBUMIN 3.6 3.9   Recent Labs  Lab 01/20/19 1114  LIPASE 31   No results for input(s): AMMONIA in the last  168 hours. Coagulation Profile: Recent Labs  Lab 01/20/19 1114  INR 1.0   Cardiac Enzymes: No results for input(s): CKTOTAL, CKMB, CKMBINDEX, TROPONINI in the last 168 hours. BNP (last 3 results) No results for input(s): PROBNP in the last 8760 hours. HbA1C: Recent Labs    01/21/19 0144  HGBA1C 8.6*   CBG: Recent Labs  Lab 01/20/19 1227 01/20/19 2124 01/21/19 0135 01/21/19 0745 01/21/19 1132  GLUCAP 237* 224* 237* 266* 319*   Lipid Profile: No results for input(s): CHOL, HDL, LDLCALC, TRIG, CHOLHDL, LDLDIRECT in the last 72 hours. Thyroid Function Tests: Recent Labs  01/21/19 0144  TSH 2.396   Anemia Panel: No results for input(s): VITAMINB12, FOLATE, FERRITIN, TIBC, IRON, RETICCTPCT in the last 72 hours. Sepsis Labs: No results for input(s): PROCALCITON, LATICACIDVEN in the last 168 hours.  Recent Results (from the past 240 hour(s))  SARS CORONAVIRUS 2 (TAT 6-24 HRS) Nasopharyngeal Nasopharyngeal Swab     Status: None   Collection Time: 01/20/19  6:20 PM   Specimen: Nasopharyngeal Swab  Result Value Ref Range Status   SARS Coronavirus 2 NEGATIVE NEGATIVE Final    Comment: (NOTE) SARS-CoV-2 target nucleic acids are NOT DETECTED. The SARS-CoV-2 RNA is generally detectable in upper and lower respiratory specimens during the acute phase of infection. Negative results do not preclude SARS-CoV-2 infection, do not rule out co-infections with other pathogens, and should not be used as the sole basis for treatment or other patient management decisions. Negative results must be combined with clinical observations, patient history, and epidemiological information. The expected result is Negative. Fact Sheet for Patients: SugarRoll.be Fact Sheet for Healthcare Providers: https://www.woods-mathews.com/ This test is not yet approved or cleared by the [place] FDA and  has been authorized for detection and/or diagnosis of  SARS-CoV-2 by FDA under an Emergency Use Authorization (EUA). This EUA will remain  in effect (meaning this test can be used) for the duration of the COVID-19 declaration under Section 56 4(b)(1) of the Act, 21 U.S.C. section 360bbb-3(b)(1), unless the authorization is terminated or revoked sooner. Performed at [facility], [place] [address], [place], [place] [address]       Radiology Studies: Ct Abdomen Pelvis W Contrast  Result Date: 01/20/2019 CLINICAL DATA:  Abdominal pain EXAM: CT ABDOMEN AND PELVIS WITH CONTRAST TECHNIQUE: Multidetector CT imaging of the abdomen and pelvis was performed using the standard protocol following bolus administration of intravenous contrast. CONTRAST:  123mL OMNIPAQUE IOHEXOL 300 MG/ML  SOLN COMPARISON:  None. FINDINGS: Lower chest: Subsegmental atelectasis. Hepatobiliary: Stable cyst of the left hepatic lobe. Gallbladder distention is unchanged. Pancreas: Unremarkable. No pancreatic ductal dilatation or surrounding inflammatory changes. Spleen: Unremarkable within limitation of streak artifact surgical clips. Adrenals/Urinary Tract: Adrenals are unremarkable. Small right renal cyst. Partially distended bladder is unremarkable. Stomach/Bowel: Postoperative changes of gastric bypass and reversal per medical record. Colonic diverticulosis without evidence of diverticulitis. Vascular/Lymphatic: No adenopathy. Reproductive: Unremarkable. Other: No ascites. Musculoskeletal: Postoperative changes of posterior lumbar spine fusion. Advanced multilevel degenerative changes. IMPRESSION: No findings to account for reported symptoms. Colonic diverticulosis without evidence of diverticulitis. Electronically Signed   By: Macy Mis M.D.   On: 01/20/2019 16:26   Dg Chest Portable 1 View  Result Date: 01/20/2019 CLINICAL DATA:  Chest pain. EXAM: PORTABLE CHEST 1 VIEW COMPARISON:  07/30/2015. FINDINGS: Patient's head obscures the superior mediastinum. Trachea is midline.  Heart is enlarged and accentuated by AP technique. Minimal streaky atelectasis or scarring in the left lung base. No airspace consolidation or pleural fluid. IMPRESSION: Streaky atelectasis or scarring in the lower left hemithorax. Electronically Signed   By: Lorin Picket M.D.   On: 01/20/2019 10:51   Scheduled Meds: . acetaminophen  1,000 mg Oral BID  . atorvastatin  40 mg Oral Daily  . DULoxetine  60 mg Oral BID  . enoxaparin (LOVENOX) injection  40 mg Subcutaneous Q24H  . [START ON 01/22/2019] influenza vaccine adjuvanted  0.5 mL Intramuscular Tomorrow-1000  . insulin aspart  0-5 Units Subcutaneous QHS  . insulin aspart  0-9 Units Subcutaneous TID WC  . insulin glargine  20 Units Subcutaneous  Q2200  . lisinopril  40 mg Oral Daily  . metoprolol tartrate  50 mg Oral BID  . pantoprazole (PROTONIX) IV  40 mg Intravenous Q12H  . simethicone  80 mg Oral QID  . thiamine injection  100 mg Intravenous Q24H  . torsemide  100 mg Oral Daily  . traZODone  300 mg Oral QHS   Continuous Infusions: . dextrose 5 % and 0.45% NaCl 50 mL/hr at 01/21/19 0700  . lactated ringers      LOS: 1 day   Kerney Elbe, DO [facility] PAGER is on [place]  If 7PM-7AM, please contact night-coverage [email or web address] Password [email or web address] 01/21/2019, 1:09 PM

## 2019-01-21 NOTE — Progress Notes (Signed)
MEWS Guidelines - (patients age 68 and over)  Red - At High Risk for Deterioration Yellow - At risk for Deterioration  1. Go to room and assess patient 2. Validate data. Is this patient's baseline? If data confirmed: 3. Is this an acute change? 4. Administer prn meds/treatments as ordered. 5. Note Sepsis score 6. Review goals of care 7. Notify Charge Nurse, RRT nurse and Provider. 8. Ask Provider to come to bedside.  9. Document patient condition/interventions/response. 10. Increase frequency of vital signs and focused assessments to at least q15 minutes x 4, then q30 minutes x2. - If stable, then q1h x3, then q4h x3 and then q8h or dept. routine. - If unstable, contact Provider & RRT nurse. Prepare for possible transfer. 11. Add entry in progress notes using the smart phrase ".MEWS". 1. Go to room and assess patient 2. Validate data. Is this patient's baseline? If data confirmed: 3. Is this an acute change? 4. Administer prn meds/treatments as ordered? 5. Note Sepsis score 6. Review goals of care 7. Notify Charge Nurse and Provider 8. Call RRT nurse as needed. 9. Document patient condition/interventions/response. 10. Increase frequency of vital signs and focused assessments to at least q2h x2. - If stable, then q4h x2 and then q8h or dept. routine. - If unstable, contact Provider & RRT nurse. Prepare for possible transfer. 11. Add entry in progress notes using the smart phrase ".MEWS".  Green - Likely stable Lavender - Comfort Care Only  1. Continue routine/ordered monitoring.  2. Review goals of care. 1. Continue routine/ordered monitoring. 2. Review goals of care.     

## 2019-01-21 NOTE — Progress Notes (Addendum)
Pt's BP trending down despite bolus infusing. Pt lethargic and states "he overall doesn't feel right." Oxygen maintained with 4L [place], slight crackles in lung bases noted. Rapid RN, Geoffery Spruce called. MD paged. See orders per MD. CXR completed, pt transferred to step down unit with rapid RN at bedside. Wife with pt, made aware of transfer.

## 2019-01-21 NOTE — Procedures (Signed)
Arterial Catheter Insertion Procedure Note Phillip Maldonado [id number] 1950/10/04  Procedure: Insertion of Arterial Catheter  Indications: Blood pressure monitoring  Procedure Details Consent: Unable to obtain consent because of emergent medical necessity. Time Out: Verified patient identification, verified procedure, site/side was marked, verified correct patient position, special equipment/implants available, medications/allergies/relevent history reviewed, required imaging and test results available.  Performed  Maximum sterile technique was used including antiseptics, cap, gloves, gown, hand hygiene, mask and sheet. Skin prep: Chlorhexidine; local anesthetic administered 20 gauge catheter was inserted into right radial artery using the Seldinger technique. ULTRASOUND GUIDANCE USED: NO Evaluation Blood flow good; BP tracing good. Complications: No apparent complications.   Phillip Maldonado 01/21/2019

## 2019-01-21 NOTE — Consult Note (Signed)
General Surgery [facility]  Reason for Consult: abdominal pain, rule out acute cholecystitis  Referring Physician: Dr. Alfredia Ferguson, [facility]  Phillip Maldonado is an 68 y.o. male.  HPI: Patient is a 68 year old male admitted yesterday to the medical service with abdominal pain.  Patient has a 2-day history of relatively sharp acute onset of right mid abdominal pain.  This is been persistent.  Patient apparently experienced some nausea and an episode of emesis.  He denies any fever or chills.  Patient underwent evaluation in the emergency department.  White blood cell count was minimally elevated at 10.8.  Patient underwent CT scan of abdomen and pelvis.  This did not demonstrate any acute findings.  The gallbladder was described as distended which was stable without acute inflammatory changes.  Patient was admitted to the medical service.  Today the patient's repeat white blood cell count was elevated at 22,000.  Total bilirubin increased from 0.8-1.5.  Right abdominal pain continued to be present.  General surgery was consulted to evaluate for possible cholecystitis.  Patient underwent an ultrasound examination.  However, due to the patient's body habitus, the gallbladder was poorly visualized and poorly evaluated.  Patient has a complex past medical and surgical history.  He has had 3 operative procedures on his abdomen for gastric bypass with 2 revisions of his gastric bypass procedure performed originally in the 1970s and bypass revisions in the 1980s.  Patient does have a history of ulceration with upper GI bleeding.  Patient had similar type pain during these episodes.  He underwent an upper endoscopy by Dr. Wilford Corner from [facility].  Past Medical History:  Diagnosis Date  . Anemia   . Angina   . Anxiety   . Arthritis   . Atrial fibrillation with RVR ([place]) 01/24/2013  . Blood transfusion    last 10' 14- "GI bleed"  . CHF (congestive heart  failure) ([place])   . CKD (chronic kidney disease) stage 3, GFR 30-59 ml/min 01/08/2016  . Depression   . Diabetes mellitus ([place]) 09/18/2011  . Diverticulosis   . DJD (degenerative joint disease)   . DM type 2, uncontrolled, with neuropathy ([place]) 12/26/2013  . Edema extremities    bilateral lower extremities-"weepy areas due to fluid retention"  . Fatty liver   . Fundic gland polyps of stomach, benign   . Headache(784.0)   . Hepatitis B   . History of alcohol abuse    last drink in 1993  . Hypertension   . Neuromuscular disorder ([place])   . 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 Obesity   . Obesity, morbid ([place]) 07/30/2015  . Pneumonia    not at present time  . Renal insufficiency   . Shortness of breath   . Sleep apnea, obstructive    does where bipap. 06-04-14 "doesn't use much now, no mask/tubing now.    Past Surgical History:  Procedure Laterality Date  . ABDOMINAL SURGERY     [phone]  . BACK SURGERY     x 8 -,multiple fusions(cervical to lumbar)  . COLONOSCOPY    . COLONOSCOPY WITH PROPOFOL N/A 06/14/2014   Procedure: COLONOSCOPY WITH PROPOFOL;  Surgeon: Gatha Mayer, MD;  Location: [facility];  Service: Endoscopy;  Laterality: N/A;  . DIAGNOSTIC LAPAROSCOPY    . ESOPHAGOGASTRODUODENOSCOPY N/A 01/03/2013   Procedure: ESOPHAGOGASTRODUODENOSCOPY (EGD);  Surgeon: Arta Silence, MD;  Location: [facility];  Service: Endoscopy;  Laterality: N/A;  . ESOPHAGOGASTRODUODENOSCOPY N/A 01/03/2013  Procedure: ESOPHAGOGASTRODUODENOSCOPY (EGD);  Surgeon: Arta Silence, MD;  Location: [facility];  Service: Endoscopy;  Laterality: N/A;  . ESOPHAGOGASTRODUODENOSCOPY N/A 01/23/2013   Procedure: ESOPHAGOGASTRODUODENOSCOPY (EGD);  Surgeon: Lear Ng, MD;  Location: [facility] END[facility]COPY;  Service: Endoscopy;  Laterality: N/A;  . ESOPHAGOGASTRODUODENOSCOPY Left 01/27/2013   Procedure: ESOPHAGOGASTRODUODENOSCOPY (EGD);  Surgeon: Lear Ng, MD;  Location: [facility];   Service: Endoscopy;  Laterality: Left;  . ESOPHAGOGASTRODUODENOSCOPY N/A 06/14/2014   Procedure: ESOPHAGOGASTRODUODENOSCOPY (EGD);  Surgeon: Gatha Mayer, MD;  Location: [facility];  Service: Endoscopy;  Laterality: N/A;  . ESOPHAGOSCOPY N/A 01/01/2013   Procedure: ESOPHAGOSCOPY;  Surgeon: Jeryl Columbia, MD;  Location: [facility];  Service: Endoscopy;  Laterality: N/A;  . GASTRIC BYPASS  1977   Reversed in 1992 and revision 1994  . HYDROCELE EXCISION  1996   x 2   . KNEE SURGERY Left 2004    Family History  Problem Relation Age of Onset  . Heart disease Father   . Skin cancer Father   . COPD Father   . Hypertension Father   . Stroke Father   . Arthritis Mother   . Osteoporosis Mother   . Diabetes Daughter   . Depression Son   . Heart disease Maternal Uncle   . High blood pressure Sister   . High blood pressure Sister   . High blood pressure Son   . Depression Son   . Arthritis Sister   . Arthritis Sister   . Cancer Maternal Grandmother   . Heart disease Maternal Uncle   . Heart disease Paternal Uncle     Social History:  reports that he has never smoked. He has never used smokeless tobacco. He reports that he does not drink alcohol or use drugs.  Allergies:  Allergies  Allergen Reactions  . Latex Other (See Comments) and Rash    Rash and itching Other reaction(s): Other (See Comments) Burning Feeling  . Piroxicam Other (See Comments) and Swelling    unknown Other reaction(s): Other (See Comments) unknown Tongue  . Chlorzoxazone     Other reaction(s): Angioedema (ALLERGY/intolerance)  . Adhesive [Tape] Other (See Comments)    Unknown paper tape ok to use     Medications: I have reviewed the patient's current medications.  Results for orders placed or performed during the hospital encounter of 01/20/19 (from the past 48 hour(s))  Comprehensive metabolic panel     Status: Abnormal   Collection Time: 01/20/19 11:14 AM  Result Value Ref Range   Sodium 134 (L) 135  - 145 mmol/L   Potassium 4.3 3.5 - 5.1 mmol/L   Chloride 98 98 - 111 mmol/L   CO2 22 22 - 32 mmol/L   Glucose, Bld 258 (H) 70 - 99 mg/dL   BUN 14 8 - 23 mg/dL   Creatinine, Ser 0.92 0.61 - 1.24 mg/dL   Calcium 9.1 8.9 - 10.3 mg/dL   Total Protein 7.3 6.5 - 8.1 g/dL   Albumin 3.6 3.5 - 5.0 g/dL   AST 24 15 - 41 U/L   ALT 24 0 - 44 U/L   Alkaline Phosphatase 58 38 - 126 U/L   Total Bilirubin 0.8 0.3 - 1.2 mg/dL   GFR calc non Af Amer >60 >60 mL/min   GFR calc Af Amer >60 >60 mL/min   Anion gap 14 5 - 15    Comment: Performed at [facility], [address] [place], [place] [address]  Magnesium     Status:  None   Collection Time: 01/20/19 11:14 AM  Result Value Ref Range   Magnesium 1.8 1.7 - 2.4 mg/dL    Comment: Performed at [facility] [address], [place], [place] [address]  Troponin I (High Sensitivity)     Status: None   Collection Time: 01/20/19 11:14 AM  Result Value Ref Range   Troponin I (High Sensitivity) 6 <18 ng/L    Comment: (NOTE) Elevated high sensitivity troponin I (hsTnI) values and significant  changes across serial measurements may suggest ACS but many other  chronic and acute conditions are known to elevate hsTnI results.  Refer to the "Links" section for chest pain algorithms and additional  guidance. Performed at [facility], [place] [address], [place], [place] [address]   Brain natriuretic peptide (order if patient c/o SOB ONLY)     Status: None   Collection Time: 01/20/19 11:14 AM  Result Value Ref Range   B Natriuretic Peptide 65.4 0.0 - 100.0 pg/mL    Comment: Performed at [facility] [address], [place], [place] [address]  CBC with Differential/Platelet     Status: Abnormal   Collection Time: 01/20/19 11:14 AM  Result Value Ref Range   WBC 10.8 (H) 4.0 - 10.5 K/uL   RBC 4.95 4.22 - 5.81 MIL/uL   Hemoglobin 14.1 13.0 - 17.0 g/dL   HCT 44.4 39.0 - 52.0 %   MCV 89.7 80.0 - 100.0 fL   MCH 28.5 26.0 - 34.0 pg    MCHC 31.8 30.0 - 36.0 g/dL   RDW 15.6 (H) 11.5 - 15.5 %   Platelets 190 150 - 400 K/uL   nRBC 0.0 0.0 - 0.2 %   Neutrophils Relative % 82 %   Neutro Abs 8.9 (H) 1.7 - 7.7 K/uL   Lymphocytes Relative 10 %   Lymphs Abs 1.0 0.7 - 4.0 K/uL   Monocytes Relative 7 %   Monocytes Absolute 0.7 0.1 - 1.0 K/uL   Eosinophils Relative 1 %   Eosinophils Absolute 0.1 0.0 - 0.5 K/uL   Basophils Relative 0 %   Basophils Absolute 0.0 0.0 - 0.1 K/uL   Immature Granulocytes 0 %   Abs Immature Granulocytes 0.03 0.00 - 0.07 K/uL    Comment: Performed at [facility] [address], [place], [place] [address]  Protime-INR     Status: None   Collection Time: 01/20/19 11:14 AM  Result Value Ref Range   Prothrombin Time 12.8 11.4 - 15.2 seconds   INR 1.0 0.8 - 1.2    Comment: (NOTE) INR goal varies based on device and disease states. Performed at [facility], [place] [address] [place], [place] [address]   Lipase, blood     Status: None   Collection Time: 01/20/19 11:14 AM  Result Value Ref Range   Lipase 31 11 - 51 U/L    Comment: Performed at [facility] [address], [place], [place] [address]  Troponin I (High Sensitivity)     Status: None   Collection Time: 01/20/19 12:25 PM  Result Value Ref Range   Troponin I (High Sensitivity) 6 <18 ng/L    Comment: (NOTE) Elevated high sensitivity troponin I (hsTnI) values and significant  changes across serial measurements may suggest ACS but many other  chronic and acute conditions are known to elevate hsTnI results.  Refer to the "Links" section for chest pain algorithms and additional  guidance. Performed at [facility], [place] [address] [place], [place] [address]   CBG monitoring, ED     Status: Abnormal   Collection Time: 01/20/19 12:27 PM  Result Value Ref Range   Glucose-Capillary 237 (H) 70 - 99 mg/dL  SARS CORONAVIRUS 2 (TAT 6-24 HRS) Nasopharyngeal Nasopharyngeal Swab     Status: None   Collection Time: 01/20/19   6:20 PM   Specimen: Nasopharyngeal Swab  Result Value Ref Range   SARS Coronavirus 2 NEGATIVE NEGATIVE    Comment: (NOTE) SARS-CoV-2 target nucleic acids are NOT DETECTED. The SARS-CoV-2 RNA is generally detectable in upper and lower respiratory specimens during the acute phase of infection. Negative results do not preclude SARS-CoV-2 infection, do not rule out co-infections with other pathogens, and should not be used as the sole basis for treatment or other patient management decisions. Negative results must be combined with clinical observations, patient history, and epidemiological information. The expected result is Negative. Fact Sheet for Patients: SugarRoll.be Fact Sheet for Healthcare Providers: https://www.woods-mathews.com/ This test is not yet approved or cleared by the [place] FDA and  has been authorized for detection and/or diagnosis of SARS-CoV-2 by FDA under an Emergency Use Authorization (EUA). This EUA will remain  in effect (meaning this test can be used) for the duration of the COVID-19 declaration under Section 56 4(b)(1) of the Act, 21 U.S.C. section 360bbb-3(b)(1), unless the authorization is terminated or revoked sooner. Performed at [facility], [place] [address] [place], [place] [address]   CBG monitoring, ED     Status: Abnormal   Collection Time: 01/20/19  9:24 PM  Result Value Ref Range   Glucose-Capillary 224 (H) 70 - 99 mg/dL   Comment 1 Notify RN    Comment 2 Document in Chart   Glucose, capillary     Status: Abnormal   Collection Time: 01/21/19  1:35 AM  Result Value Ref Range   Glucose-Capillary 237 (H) 70 - 99 mg/dL  Hemoglobin A1c     Status: Abnormal   Collection Time: 01/21/19  1:44 AM  Result Value Ref Range   Hgb A1c MFr Bld 8.6 (H) 4.8 - 5.6 %    Comment: (NOTE) Pre diabetes:          5.7%-6.4% Diabetes:              >6.4% Glycemic control for   <7.0% adults with diabetes     Mean Plasma Glucose 200.12 mg/dL    Comment: Performed at [facility] [address], [place], [place] [address]  HIV Antibody (routine testing w rflx)     Status: None   Collection Time: 01/21/19  1:44 AM  Result Value Ref Range   HIV Screen 4th Generation wRfx NON REACTIVE NON REACTIVE    Comment: Performed at [facility] [address] [place], [place] [address]  TSH     Status: None   Collection Time: 01/21/19  1:44 AM  Result Value Ref Range   TSH 2.396 0.350 - 4.500 uIU/mL    Comment: Performed by a 3rd Generation assay with a functional sensitivity of <=0.01 uIU/mL. Performed at [facility], [place] [address] [place], [place] [address]   Glucose, capillary     Status: Abnormal   Collection Time: 01/21/19  7:45 AM  Result Value Ref Range   Glucose-Capillary 266 (H) 70 - 99 mg/dL  CBC with Differential/Platelet     Status: Abnormal   Collection Time: 01/21/19  8:27 AM  Result Value Ref Range   WBC 22.2 (H) 4.0 - 10.5 K/uL   RBC 5.36 4.22 - 5.81 MIL/uL   Hemoglobin 15.4 13.0 - 17.0 g/dL   HCT 49.0 39.0 - 52.0 %   MCV 91.4 80.0 - 100.0 fL   MCH 28.7 26.0 - 34.0 pg   MCHC 31.4 30.0 - 36.0 g/dL   RDW 16.7 (H) 11.5 - 15.5 %   Platelets 279 150 - 400 K/uL   nRBC 0.0 0.0 - 0.2 %   Neutrophils Relative % 81 %   Neutro Abs 18.2 (H) 1.7 - 7.7 K/uL   Lymphocytes Relative 9 %   Lymphs Abs 1.9 0.7 - 4.0 K/uL   Monocytes Relative 9 %   Monocytes Absolute 1.9 (H) 0.1 - 1.0 K/uL   Eosinophils Relative 0 %   Eosinophils Absolute 0.0 0.0 - 0.5 K/uL   Basophils Relative 0 %   Basophils Absolute 0.1 0.0 - 0.1 K/uL   Immature Granulocytes 1 %   Abs Immature Granulocytes 0.16 (H) 0.00 - 0.07 K/uL    Comment: Performed at [facility], [place] [address], [place], [place] [address]  Comprehensive metabolic panel     Status: Abnormal   Collection Time: 01/21/19  8:27 AM  Result Value Ref Range   Sodium 135 135 - 145 mmol/L   Potassium 3.7  3.5 - 5.1 mmol/L   Chloride 93 (L) 98 - 111 mmol/L   CO2 25 22 - 32 mmol/L   Glucose, Bld 245 (H) 70 - 99 mg/dL   BUN 18 8 - 23 mg/dL   Creatinine, Ser 1.18 0.61 - 1.24 mg/dL   Calcium 9.0 8.9 - 10.3 mg/dL   Total Protein 8.2 (H) 6.5 - 8.1 g/dL   Albumin 3.9 3.5 - 5.0 g/dL   AST 36 15 - 41 U/L   ALT 26 0 - 44 U/L   Alkaline Phosphatase 63 38 - 126 U/L   Total Bilirubin 1.5 (H) 0.3 - 1.2 mg/dL   GFR calc non Af Amer >60 >60 mL/min   GFR calc Af Amer >60 >60 mL/min   Anion gap 17 (H) 5 - 15    Comment: Performed at [facility], [place] [address] [place], [place] [address]  Magnesium     Status: None   Collection Time: 01/21/19  8:27 AM  Result Value Ref Range   Magnesium 1.8 1.7 - 2.4 mg/dL    Comment: Performed at [facility], [place] [address], [place], [place] [address]  Phosphorus     Status: None   Collection Time: 01/21/19  8:27 AM  Result Value Ref Range   Phosphorus 3.2 2.5 - 4.6 mg/dL    Comment: Performed at [facility], [place] [address], [place], [place] [address]  Glucose, capillary     Status: Abnormal   Collection Time: 01/21/19 11:32 AM  Result Value Ref Range   Glucose-Capillary 319 (H) 70 - 99 mg/dL  Lipase, blood     Status: Abnormal   Collection Time: 01/21/19  1:32 PM  Result Value Ref Range   Lipase 1,054 (H) 11 - 51 U/L    Comment: RESULTS CONFIRMED BY MANUAL DILUTION Performed at [facility], [place] [address], [place], [place] [address]     US Abdomen Complete  Result Date: 01/21/2019 CLINICAL DATA:  Right upper quadrant pain. EXAM: ABDOMEN ULTRASOUND COMPLETE COMPARISON:  CT abdomen pelvis 01/20/2019 FINDINGS: Extremely limited exam due to patient body habitus.  Gallbladder: Poorly visualized. The wall may measure up to 0.5 cm in diameter insert spots. No definite calculi. Negative sonographic Murphy sign. The Common bile duct: Diameter: Not visualized Liver: Diffuse increased  echogenicity of the liver. No definite focal lesion. The portal vein appears patent and in normal direction. IVC: No abnormality visualized. Pancreas: Not visualized. Spleen: Unremarkable.  Poorly visualized. Right Kidney: Length: 14.4 cm.  No definite hydronephrosis. Left Kidney: Length: 13.2 cm.  No definite hydronephrosis. Abdominal aorta: Not visualized. Other findings: None. IMPRESSION: 1. Extremely limited exam due to patient body habitus. Many structures were not visualized at all. 2. Diffusely increased echogenicity of the liver most commonly seen with hepatic steatosis. 3.  Possible gallbladder wall thickening but poorly assessed. Electronically Signed   By: Audie Pinto M.D.   On: 01/21/2019 14:58   Ct Abdomen Pelvis W Contrast  Result Date: 01/20/2019 CLINICAL DATA:  Abdominal pain EXAM: CT ABDOMEN AND PELVIS WITH CONTRAST TECHNIQUE: Multidetector CT imaging of the abdomen and pelvis was performed using the standard protocol following bolus administration of intravenous contrast. CONTRAST:  166mL OMNIPAQUE IOHEXOL 300 MG/ML  SOLN COMPARISON:  None. FINDINGS: Lower chest: Subsegmental atelectasis. Hepatobiliary: Stable cyst of the left hepatic lobe. Gallbladder distention is unchanged. Pancreas: Unremarkable. No pancreatic ductal dilatation or surrounding inflammatory changes. Spleen: Unremarkable within limitation of streak artifact surgical clips. Adrenals/Urinary Tract: Adrenals are unremarkable. Small right renal cyst. Partially distended bladder is unremarkable. Stomach/Bowel: Postoperative changes of gastric bypass and reversal per medical record. Colonic diverticulosis without evidence of diverticulitis. Vascular/Lymphatic: No adenopathy. Reproductive: Unremarkable. Other: No ascites. Musculoskeletal: Postoperative changes of posterior lumbar spine fusion. Advanced multilevel degenerative changes. IMPRESSION: No findings to account for reported symptoms. Colonic diverticulosis without  evidence of diverticulitis. Electronically Signed   By: Macy Mis M.D.   On: 01/20/2019 16:26   Dg Chest Portable 1 View  Result Date: 01/20/2019 CLINICAL DATA:  Chest pain. EXAM: PORTABLE CHEST 1 VIEW COMPARISON:  07/30/2015. FINDINGS: Patient's head obscures the superior mediastinum. Trachea is midline. Heart is enlarged and accentuated by AP technique. Minimal streaky atelectasis or scarring in the left lung base. No airspace consolidation or pleural fluid. IMPRESSION: Streaky atelectasis or scarring in the lower left hemithorax. Electronically Signed   By: Lorin Picket M.D.   On: 01/20/2019 10:51    Review of Systems  Constitutional: Negative for chills, diaphoresis and fever.  HENT: Negative.   Eyes: Negative.   Respiratory: Negative.   Cardiovascular: Negative.   Gastrointestinal: Positive for abdominal pain, nausea and vomiting.  Genitourinary: Negative.   Musculoskeletal: Positive for back pain.  Skin: Negative.   Neurological: Negative.   Endo/Heme/Allergies: Negative.   Psychiatric/Behavioral: Negative.    Blood pressure (!) 100/57, pulse 94, temperature 99.3 F (37.4 C), temperature source Oral, resp. rate 18, height 5\' 9"  (1.753 m), weight (!) 152 kg, SpO2 96 %. Physical Exam  Constitutional: He is oriented to person, place, and time. He appears well-developed and well-nourished. No distress.  HENT:  Head: Normocephalic and atraumatic.  Right Ear: External ear normal.  Left Ear: External ear normal.  Eyes: Pupils are equal, round, and reactive to light. Conjunctivae are normal. No scleral icterus.  Neck: Normal range of motion. Neck supple. No tracheal deviation present. No thyromegaly present.  Cardiovascular: Normal rate, regular rhythm and normal heart sounds.  Respiratory: Effort normal and breath sounds normal. No respiratory distress. He has no wheezes.  GI: Soft. Bowel sounds are normal. He exhibits no distension and no mass. There is  abdominal tenderness  (right mid abdomen and RUQ, mild tenderness LUQ). There is no rebound and no guarding.  Musculoskeletal: Normal range of motion.  Lymphadenopathy:    He has no cervical adenopathy.  Neurological: He is alert and oriented to person, place, and time.  Skin: Skin is warm and dry. He is not diaphoretic.  Psychiatric: He has a normal mood and affect. His behavior is normal.    Assessment/Plan: Abdominal pain, primary right upper quadrant - CT and USN fail to identify evidence of acute cholecystitis, has hx of cholelithiasis on previous studies - LFT's normal - recommend HIDA scan to rule out cystic duct obstruction or acute cholecystitis - ordered - consider GI consultation for possible EGD - patient had similar symptoms with UGIB and ulcers (marginal) related to gastric bypass procedure  No indication for urgent operative intervention.  Will follow with you.  Armandina Gemma, [facility] Office: [place] 01/21/2019, 3:59 PM

## 2019-01-21 NOTE — Progress Notes (Addendum)
The patient started worsening through out the afternoon and his blood pressure started dropping. Due to his low blood pressure we have transferred him to the stepdown unit. A stat X-Ray has also been ordered as the patient was complaining of some dyspnea and was saturating in the low 90's. Nursing placed the patient on 3 Liters. X-Ray is still currently pending. I have informed Dr. Chase Caller of critical care who will sign out to his night team to come evaluate the patient for further evaluation. Patient was given another 500 mL bolus and was started on scheduled Xopenex and Atrovent.  Repeat labs are still pending but WBC showed a slight improvement but his Lipase Level was elevated over 1000.  Likely the patient had the pancreatitis could be pulmonary gallstone possibly.  He is made n.p.o. now and will continue IV fluid hydration.  Nursing reports that patient is little bit more fatigued throughout the day . We have ordered an echocardiogram as well as nuclear medicine HIDA scan for this patient.  Gastroenterology was also consulted given his elevated lipase and abdominal pain.  All the patient's antihypertensives have been held now we will continue to monitor patient's clinical response carefully.

## 2019-01-21 NOTE — H&P (Signed)
NAME:  Phillip Maldonado, MRN:  [id number], DOB:  October 03, 1950, LOS: 1 ADMISSION DATE:  01/20/2019, CONSULTATION DATE:  01/21/2019 REFERRING MD:  TRH, CHIEF COMPLAINT:  Septic shock   Brief History   68 yo M with a history of gastric bypass surgery, GI ulcer, who was admitted to [facility] 11/6 for abdominal pain (RUQ) with unremarkable CT of Abdomen, worsening hypotension this afternoon (11/7) (was given home torsemide and lisinopril this morning) with worsening pain and lipase elevated to >1000, along with small rise in bilirubin.  Given 1.5L IVF so far, started on zosyn, and transferred to step down unit.  PCCM was asked to consult.  GI and general surgery are on board.  Gen surg says no surgical intervention indicated at this time.    Patient transferred to ICU status at 10pm.    History of present illness   Patient says his abdominal pain is worse and he is mentating appropriately but lethargic.  Blood pressure has been in 80s-90s/40s.  He had some increased shortness of breath initially when he started getting sicker today, was placed on 3L nasal cannula.  Now his breathing is improved.   Past Medical History   Past Medical History:  Diagnosis Date  . Anemia   . Angina   . Anxiety   . Arthritis   . Atrial fibrillation with RVR ([place]) 01/24/2013  . Blood transfusion    last 10' 14- "GI bleed"  . CHF (congestive heart failure) ([place])   . CKD (chronic kidney disease) stage 3, GFR 30-59 ml/min 01/08/2016  . Depression   . Diabetes mellitus ([place]) 09/18/2011  . Diverticulosis   . DJD (degenerative joint disease)   . DM type 2, uncontrolled, with neuropathy ([place]) 12/26/2013  . Edema extremities    bilateral lower extremities-"weepy areas due to fluid retention"  . Fatty liver   . Fundic gland polyps of stomach, benign   . Headache(784.0)   . Hepatitis B   . History of alcohol abuse    last drink in 1993  . Hypertension   . Neuromuscular disorder ([place])   . 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 Obesity   . Obesity, morbid ([place]) 07/30/2015  . Pneumonia    not at present time  . Renal insufficiency   . Shortness of breath   . Sleep apnea, obstructive    does where bipap. 06-04-14 "doesn't use much now, no mask/tubing now.     Significant Hospital Events   Transfer to [place] 11/7 for hypotension  Consults:  General surgery PCCM GI  Procedures:    Significant Diagnostic Tests:  CT A/P 11/6: IMPRESSION: No findings to account for reported symptoms. Colonic diverticulosis without evidence of diverticulitis.  RUQ [place] 11/7: IMPRESSION: 1. Extremely limited exam due to patient body habitus. Many structures were not visualized at all.  2. Diffusely increased echogenicity of the liver most commonly seen with hepatic steatosis.  3.  Possible gallbladder wall thickening but poorly assessed.  CXR 11/7: IMPRESSION: Mild right basilar atelectasis. No evidence of acute cardiopulmonary disease.  Micro Data:  Blood culture 11/7 Urine cultures 11/7 SARS COV2 neg 11/6   Antimicrobials:  Zosyn 11/7 started   Interim history/subjective:    Objective   Blood pressure (!) 83/37, pulse 83, temperature (!) 97.4 F (36.3 C), temperature source Oral, resp. rate (!) 30, height 5\' 9"  (1.753 m), weight (!) 161 kg, SpO2 97 %.        Intake/Output Summary (Last 24 hours)  at 01/21/2019 2052 Last data filed at 01/21/2019 1548 Gross per 24 hour  Intake 670.74 ml  Output 450 ml  Net 220.74 ml   Filed Weights   01/20/19 1023 01/21/19 1918  Weight: (!) 152 kg (!) 161 kg    Examination: General: lethargic, arouses easily HENT: no abnormalities Lungs: diminished breath sounds bilaterally, worse on right Cardiovascular: 1+ radial pulses Abdomen: soft, with guarding on palpation in RUQ. Also moderately tender in epigastric region Extremities: trace edema bialterally Neuro: grosslly normal GU: foley in place  Resolved Hospital Problem list     Assessment  & Plan:  # Distributive Shock, likely septic from abdominal source # Pancreatitis # Acute liver injury Concern for cholecystitis, gallstone pancreatitis.  He has had elevated lipase and amylase and acute rise in bilirubin and transaminases.  Had a previous CT yesterday on admission which was largely non-revealing.  He also had torsemide and lisinopril 40mg  this morning - zosyn started, continue - a total of 2L in boluses given since this afternoon, and he has been getting lactated ringers at 75/hr.  Giving another 526mL right now.  - bedside ultrasound showed moderately enlarged RV, no McConnell sign. LV function is adequate.  Formal echo pending - getting lactate, ABG - Arterial line and central line placed.   - Gen surg consulted - starting levophed for MAP goal >65 - acetaminophen d/c'd  # AKI: likely prerenal in the setting of shock and also got torsemide - continue to trend - fluid resuscitation as above  # Hypoxia:  No signs of pulmonary edema on CXR.  Currently stable on minimal supplemental O2, and would continue fluid resuscitation.  Patient says he has a history of CHF, though not able to see a recent Echo.   - continue nasal cannula  # DM: currently glucoses are 200s, - lantus and SSI  # History of gastric bypass   Best practice:  Diet: NPO Pain/Anxiety/Delirium protocol (if indicated): n/a VAP protocol (if indicated): n/a DVT prophylaxis: lovenox 80 sq GI prophylaxis: pantoprazole 40 Glucose control: lantus and SSI Mobility: deferred Code Status: Full Family Communication: talked with wife at bedside Disposition:--> ICU status  Labs   CBC: Recent Labs  Lab 01/20/19 1114 01/21/19 0827 01/21/19 1817  WBC 10.8* 22.2* 18.1*  NEUTROABS 8.9* 18.2* 15.0*  HGB 14.1 15.4 13.4  HCT 44.4 49.0 43.1  MCV 89.7 91.4 90.4  PLT 190 279 [id number]    Basic Metabolic Panel: Recent Labs  Lab 01/20/19 1114 01/21/19 0827 01/21/19 1817  NA 134* 135 136  K 4.3 3.7 4.1  CL  98 93* 95*  CO2 22 25 24   GLUCOSE 258* 245* 265*  BUN 14 18 27*  CREATININE 0.92 1.18 2.25*  CALCIUM 9.1 9.0 8.6*  MG 1.8 1.8 1.7  PHOS  --  3.2 2.4*   GFR: Estimated Creatinine Clearance: 48.1 mL/min (A) (by C-G formula based on SCr of 2.25 mg/dL (H)). Recent Labs  Lab 01/20/19 1114 01/21/19 0827 01/21/19 1817  WBC 10.8* 22.2* 18.1*    Liver Function Tests: Recent Labs  Lab 01/20/19 1114 01/21/19 0827 01/21/19 1817  AST 24 36 449*  ALT 24 26 298*  ALKPHOS 58 63 116  BILITOT 0.8 1.5* 5.5*  PROT 7.3 8.2* 7.1  ALBUMIN 3.6 3.9 3.4*   Recent Labs  Lab 01/20/19 1114 01/21/19 1332 01/21/19 1900  LIPASE 31 1,054*  --   AMYLASE  --   --  509*   No results for input(s): AMMONIA in the  last 168 hours.  ABG    Component Value Date/Time   TCO2 21 01/01/2013 2056     Coagulation Profile: Recent Labs  Lab 01/20/19 1114  INR 1.0    Cardiac Enzymes: No results for input(s): CKTOTAL, CKMB, CKMBINDEX, TROPONINI in the last 168 hours.  HbA1C: Hgb A1c MFr Bld  Date/Time Value Ref Range Status  01/21/2019 01:44 AM 8.6 (H) 4.8 - 5.6 % Final    Comment:    (NOTE) Pre diabetes:          5.7%-6.4% Diabetes:              >6.4% Glycemic control for   <7.0% adults with diabetes   10/01/2016 03:04 PM 7.2 (H) <5.7 % Final    Comment:      For someone without known diabetes, a hemoglobin A1c value of 6.5% or greater indicates that they may have diabetes and this should be confirmed with a follow-up test.   For someone with known diabetes, a value <7% indicates that their diabetes is well controlled and a value greater than or equal to 7% indicates suboptimal control. A1c targets should be individualized based on duration of diabetes, age, comorbid conditions, and other considerations.   Currently, no consensus exists for use of hemoglobin A1c for diagnosis of diabetes for children.       CBG: Recent Labs  Lab 01/20/19 2124 01/21/19 0135 01/21/19 0745  01/21/19 1132 01/21/19 1631  GLUCAP 224* 237* 266* 319* 248*    Review of Systems:     Past Medical History  He,  has a past medical history of Anemia, Angina, Anxiety, Arthritis, Atrial fibrillation with RVR ([place]) (01/24/2013), Blood transfusion, CHF (congestive heart failure) ([place]), CKD (chronic kidney disease) stage 3, GFR 30-59 ml/min (01/08/2016), Depression, Diabetes mellitus ([place]) (09/18/2011), Diverticulosis, DJD (degenerative joint disease), DM type 2, uncontrolled, with neuropathy ([place]) (12/26/2013), Edema extremities, Fatty liver, Fundic gland polyps of stomach, benign, Headache(784.0), Hepatitis B, History of alcohol abuse, Hypertension, Neuromuscular disorder ([place]), Neuropathy, median nerve (09/11/2014), Obesity, Obesity, morbid ([place]) (07/30/2015), Pneumonia, Renal insufficiency, Shortness of breath, and Sleep apnea, obstructive.   Surgical History    Past Surgical History:  Procedure Laterality Date  . ABDOMINAL SURGERY     [phone]  . BACK SURGERY     x 8 -,multiple fusions(cervical to lumbar)  . COLONOSCOPY    . COLONOSCOPY WITH PROPOFOL N/A 06/14/2014   Procedure: COLONOSCOPY WITH PROPOFOL;  Surgeon: Gatha Mayer, MD;  Location: [facility] ENDOSCOPY;  Service: Endoscopy;  Laterality: N/A;  . DIAGNOSTIC LAPAROSCOPY    . ESOPHAGOGASTRODUODENOSCOPY N/A 01/03/2013   Procedure: ESOPHAGOGASTRODUODENOSCOPY (EGD);  Surgeon: Arta Silence, MD;  Location: [facility] ORS;  Service: Endoscopy;  Laterality: N/A;  . ESOPHAGOGASTRODUODENOSCOPY N/A 01/03/2013   Procedure: ESOPHAGOGASTRODUODENOSCOPY (EGD);  Surgeon: Arta Silence, MD;  Location: [facility];  Service: Endoscopy;  Laterality: N/A;  . ESOPHAGOGASTRODUODENOSCOPY N/A 01/23/2013   Procedure: ESOPHAGOGASTRODUODENOSCOPY (EGD);  Surgeon: Lear Ng, MD;  Location[facility]COPY;  Service: Endoscopy;  Laterality: N/A;  . ESOPHAGOGASTRODUODENOSCOPY Left 01/27/2013   Procedure: ESOPHAGOGASTRODUODENOSCOPY (EGD);  Surgeon: Lear Ng, MD;  Location: [facility];  Service: Endoscopy;  Laterality: Left;  . ESOPHAGOGASTRODUODENOSCOPY N/A 06/14/2014   Procedure: ESOPHAGOGASTRODUODENOSCOPY (EGD);  Surgeon: Gatha Mayer, MD;  Location: [facility];  Service: Endoscopy;  Laterality: N/A;  . ESOPHAGOSCOPY N/A 01/01/2013   Procedure: ESOPHAGOSCOPY;  Surgeon: Jeryl Columbia, MD;  Location: [facility];  Service: Endoscopy;  Laterality: N/A;  . GASTRIC BYPASS  1977  Reversed in 1992 and revision 1994  . HYDROCELE EXCISION  1996   x 2   . KNEE SURGERY Left 2004     Social History   reports that he has never smoked. He has never used smokeless tobacco. He reports that he does not drink alcohol or use drugs.   Family History   His family history includes Arthritis in his mother, sister, and sister; COPD in his father; Cancer in his maternal grandmother; Depression in his son and son; Diabetes in his daughter; Heart disease in his father, maternal uncle, maternal uncle, and paternal uncle; High blood pressure in his sister, sister, and son; Hypertension in his father; Osteoporosis in his mother; Skin cancer in his father; Stroke in his father.   Allergies Allergies  Allergen Reactions  . Latex Other (See Comments) and Rash    Rash and itching Other reaction(s): Other (See Comments) Burning Feeling  . Piroxicam Other (See Comments) and Swelling    unknown Other reaction(s): Other (See Comments) unknown Tongue  . Chlorzoxazone     Other reaction(s): Angioedema (ALLERGY/intolerance)  . Adhesive [Tape] Other (See Comments)    Unknown paper tape ok to use      Home Medications  Prior to Admission medications   Medication Sig Start Date End Date Taking? Authorizing Provider  acetaminophen (TYLENOL) 650 MG CR tablet Take 2 tablets (1,300 mg total) by mouth 2 (two) times daily. 10/01/16  Yes [name], NP  amLODipine (NORVASC) 5 MG tablet Take 5 mg by mouth daily. 09/19/18  Yes [provider]  atorvastatin  (LIPITOR) 40 MG tablet Take 1 tablet (40 mg total) by mouth daily. 02/22/17  Yes [name], NP  cholecalciferol (VITAMIN D3) 25 MCG (1000 UT) tablet Take 1,000 Units by mouth daily.   Yes [provider]  DULoxetine (CYMBALTA) 60 MG capsule TAKE 1 CAPSULE(60 MG) BY MOUTH TWICE DAILY Patient taking differently: Take 60 mg by mouth daily. TAKE 1 CAPSULE(60 MG) BY MOUTH TWICE DAILY 02/22/17  Yes [name], NP  Insulin Glargine (LANTUS SOLOSTAR) 100 UNIT/ML Solostar Pen inject 22-24 UNITS EVERY MORNING 02/22/17  Yes [name], NP  losartan (COZAAR) 100 MG tablet Take 100 mg by mouth daily. 11/03/18  Yes [provider]  metFORMIN (GLUCOPHAGE) 1000 MG tablet Take one tablet by mouth twice daily with a meal to control blood sugar 01/22/17  Yes [name], NP  metoprolol succinate (TOPROL-XL) 100 MG 24 hr tablet Take 100 mg by mouth daily. 09/22/18  Yes [provider]  Multiple Vitamin (MULITIVITAMIN WITH MINERALS) TABS Take 1 tablet by mouth daily.   Yes [provider]  pantoprazole (PROTONIX) 40 MG tablet TAKE 1 TABLET(40 MG) BY MOUTH TWICE DAILY Patient taking differently: Take 40 mg by mouth 2 (two) times daily. TAKE 1 TABLET(40 MG) BY MOUTH TWICE DAILY 02/22/17  Yes [name], NP  polyethylene glycol (MIRALAX / GLYCOLAX) packet Take 17 g by mouth daily as needed for mild constipation or moderate constipation.   Yes [provider]  polyvinyl alcohol (LIQUIFILM TEARS) 1.4 % ophthalmic solution Place 1 drop into both eyes as needed for dry eyes.   Yes [provider]  pregabalin (LYRICA) 150 MG capsule Take 150 mg by mouth 3 (three) times daily. 09/22/18  Yes [provider]  torsemide (DEMADEX) 100 MG tablet Take 1 tablet (100 mg total) by mouth 2 (two) times daily. Patient taking differently: Take 50 mg by mouth daily.  11/03/16  Yes [name], NP  gabapentin (NEURONTIN) 300 MG capsule Take  One to Two tablets by mouth three times daily as needed for pain. Patient not taking: Reported on 01/21/2019 02/22/17   [name], NP  glucose blood (ACCU-CHEK AVIVA PLUS) test strip 1 each by Other route See admin instructions. Check blood sugar twice daily    [provider]  lisinopril (PRINIVIL,ZESTRIL) 40 MG tablet TAKE 1 TABLET(40 MG) BY MOUTH DAILY Patient not taking: No sig reported 12/31/16   [name], NP  methadone (DOLOPHINE) 10 MG tablet Take 1 tablet (10 mg total) by mouth every 8 (eight) hours as needed. Patient not taking: Reported on 01/21/2019 01/14/17   Hollace Kinnier L, DO  metolazone (ZAROXOLYN) 2.5 MG tablet One daily to help reduce edema Patient not taking: Reported on 01/21/2019 02/22/17   [name], NP  metoprolol tartrate (LOPRESSOR) 50 MG tablet TAKE 1 TABLET(50 MG) BY MOUTH TWICE DAILY Patient not taking: Reported on 01/21/2019 02/22/17   [name], NP  naloxegol oxalate (MOVANTIK) 25 MG TABS tablet Take 1 tablet (25 mg total) by mouth daily. Patient not taking: Reported on 01/21/2019 02/22/17   [name], NP  nystatin cream (MYCOSTATIN) Apply to yeast dermatitis in groin daily Patient not taking: Reported on 01/21/2019 01/20/17   [name], NP  traZODone (DESYREL) 100 MG tablet TAKE 3 TABLETS BY MOUTH AT BEDTIME FOR REST Patient not taking: Reported on 01/21/2019 02/22/17   [name], NP  triamcinolone cream (KENALOG) 0.1 % Apply sparingly to rash up to twice daily Patient not taking: Reported on 01/21/2019 01/20/17   [name], NP     Critical care time: 22

## 2019-01-21 NOTE — Progress Notes (Signed)
Pharmacy Antibiotic Note  Phillip Maldonado is a 68 y.o. male admitted on 01/20/2019 with Colonic diverticulosis without evidence of diverticulitis. Marland Kitchen  Pharmacy has been consulted for zosyn dosing.  Plan: Zosyn 3.375g IV q8h (4 hour infusion).  LMWH dose increased to 80 mg qday for VTE px w/ BMI > 30 Pharmacy to sign off  Height: 5\' 9"  (175.3 cm) Weight: (!) 335 lb (152 kg) IBW/kg (Calculated) : 70.7  Temp (24hrs), Avg:99.3 F (37.4 C), Min:99.1 F (37.3 C), Max:99.5 F (37.5 C)  Recent Labs  Lab 01/20/19 1114 01/21/19 0827  WBC 10.8* 22.2*  CREATININE 0.92 1.18    Estimated Creatinine Clearance: 88.7 mL/min (by C-G formula based on SCr of 1.18 mg/dL).    Allergies  Allergen Reactions  . Latex Other (See Comments) and Rash    Rash and itching Other reaction(s): Other (See Comments) Burning Feeling  . Piroxicam Other (See Comments) and Swelling    unknown Other reaction(s): Other (See Comments) unknown Tongue  . Chlorzoxazone     Other reaction(s): Angioedema (ALLERGY/intolerance)  . Adhesive [Tape] Other (See Comments)    Unknown paper tape ok to use     Thank you for allowing pharmacy to be a part of this patient's care.  Eudelia Bunch, Pharm.D [phone] 01/21/2019 1:49 PM

## 2019-01-22 ENCOUNTER — Other Ambulatory Visit (HOSPITAL_COMMUNITY): Payer: Medicare Other

## 2019-01-22 ENCOUNTER — Inpatient Hospital Stay (HOSPITAL_COMMUNITY): Payer: Medicare Other

## 2019-01-22 ENCOUNTER — Inpatient Hospital Stay (HOSPITAL_COMMUNITY)
Admit: 2019-01-22 | Discharge: 2019-01-22 | Disposition: A | Discharge status: Home / Self Care | Payer: Medicare Other | Attending: Internal Medicine | Admitting: Internal Medicine

## 2019-01-22 DIAGNOSIS — K859 Acute pancreatitis without necrosis or infection, unspecified: Secondary | ICD-10-CM

## 2019-01-22 DIAGNOSIS — Z452 Encounter for adjustment and management of vascular access device: Secondary | ICD-10-CM

## 2019-01-22 DIAGNOSIS — Z6841 Body Mass Index (BMI) 40.0 and over, adult: Secondary | ICD-10-CM

## 2019-01-22 DIAGNOSIS — E1165 Type 2 diabetes mellitus with hyperglycemia: Secondary | ICD-10-CM

## 2019-01-22 DIAGNOSIS — R0602 Shortness of breath: Secondary | ICD-10-CM

## 2019-01-22 DIAGNOSIS — R7989 Other specified abnormal findings of blood chemistry: Secondary | ICD-10-CM

## 2019-01-22 DIAGNOSIS — K289 Gastrojejunal ulcer, unspecified as acute or chronic, without hemorrhage or perforation: Secondary | ICD-10-CM

## 2019-01-22 DIAGNOSIS — Z9884 Bariatric surgery status: Secondary | ICD-10-CM

## 2019-01-22 DIAGNOSIS — K831 Obstruction of bile duct: Secondary | ICD-10-CM

## 2019-01-22 DIAGNOSIS — R748 Abnormal levels of other serum enzymes: Secondary | ICD-10-CM

## 2019-01-22 DIAGNOSIS — E1140 Type 2 diabetes mellitus with diabetic neuropathy, unspecified: Secondary | ICD-10-CM

## 2019-01-22 LAB — CBC WITH DIFFERENTIAL/PLATELET
Abs Immature Granulocytes: 0.17 10*3/uL — ABNORMAL HIGH (ref 0.00–0.07)
Basophils Absolute: 0.1 10*3/uL (ref 0.0–0.1)
Basophils Relative: 0 %
Eosinophils Absolute: 0 10*3/uL (ref 0.0–0.5)
Eosinophils Relative: 0 %
HCT: 38.1 % — ABNORMAL LOW (ref 39.0–52.0)
Hemoglobin: 12.3 g/dL — ABNORMAL LOW (ref 13.0–17.0)
Immature Granulocytes: 1 %
Lymphocytes Relative: 9 %
Lymphs Abs: 1.9 10*3/uL (ref 0.7–4.0)
MCH: 28.9 pg (ref 26.0–34.0)
MCHC: 32.3 g/dL (ref 30.0–36.0)
MCV: 89.4 fL (ref 80.0–100.0)
Monocytes Absolute: 1.7 10*3/uL — ABNORMAL HIGH (ref 0.1–1.0)
Monocytes Relative: 8 %
Neutro Abs: 16.7 10*3/uL — ABNORMAL HIGH (ref 1.7–7.7)
Neutrophils Relative %: 82 %
Platelets: 239 10*3/uL (ref 150–400)
RBC: 4.26 MIL/uL (ref 4.22–5.81)
RDW: 17 % — ABNORMAL HIGH (ref 11.5–15.5)
WBC: 20.5 10*3/uL — ABNORMAL HIGH (ref 4.0–10.5)
nRBC: 0 % (ref 0.0–0.2)

## 2019-01-22 LAB — HEPATITIS PANEL, ACUTE
HCV Ab: NONREACTIVE
Hep A IgM: NONREACTIVE
Hep B C IgM: NONREACTIVE
Hepatitis B Surface Ag: NONREACTIVE

## 2019-01-22 LAB — COMPREHENSIVE METABOLIC PANEL
ALT: 230 U/L — ABNORMAL HIGH (ref 0–44)
AST: 277 U/L — ABNORMAL HIGH (ref 15–41)
Albumin: 3.1 g/dL — ABNORMAL LOW (ref 3.5–5.0)
Alkaline Phosphatase: 102 U/L (ref 38–126)
Anion gap: 12 (ref 5–15)
BUN: 33 mg/dL — ABNORMAL HIGH (ref 8–23)
CO2: 24 mmol/L (ref 22–32)
Calcium: 7.8 mg/dL — ABNORMAL LOW (ref 8.9–10.3)
Chloride: 97 mmol/L — ABNORMAL LOW (ref 98–111)
Creatinine, Ser: 2.99 mg/dL — ABNORMAL HIGH (ref 0.61–1.24)
GFR calc Af Amer: 24 mL/min — ABNORMAL LOW (ref >60)
GFR calc non Af Amer: 21 mL/min — ABNORMAL LOW (ref >60)
Glucose, Bld: 212 mg/dL — ABNORMAL HIGH (ref 70–99)
Potassium: 3.6 mmol/L (ref 3.5–5.1)
Sodium: 133 mmol/L — ABNORMAL LOW (ref 135–145)
Total Bilirubin: 5.3 mg/dL — ABNORMAL HIGH (ref 0.3–1.2)
Total Protein: 6.6 g/dL (ref 6.5–8.1)

## 2019-01-22 LAB — MAGNESIUM: Magnesium: 1.7 mg/dL (ref 1.7–2.4)

## 2019-01-22 LAB — ECHOCARDIOGRAM COMPLETE
Height: 69 in
Weight: 5679.05 oz

## 2019-01-22 LAB — PHOSPHORUS: Phosphorus: 3.1 mg/dL (ref 2.5–4.6)

## 2019-01-22 LAB — GLUCOSE, CAPILLARY
Glucose-Capillary: 179 mg/dL — ABNORMAL HIGH (ref 70–99)
Glucose-Capillary: 166 mg/dL — ABNORMAL HIGH (ref 70–99)
Glucose-Capillary: 173 mg/dL — ABNORMAL HIGH (ref 70–99)
Glucose-Capillary: 154 mg/dL — ABNORMAL HIGH (ref 70–99)

## 2019-01-22 LAB — LACTIC ACID, PLASMA: Lactic Acid, Venous: 1.3 mmol/L (ref 0.5–1.9)

## 2019-01-22 LAB — LIPASE, BLOOD: Lipase: 219 U/L — ABNORMAL HIGH (ref 11–51)

## 2019-01-22 MED ORDER — SODIUM CHLORIDE 0.9% FLUSH
10.0000 mL | Freq: Two times a day (BID) | INTRAVENOUS | Status: DC
  Administered 2019-01-22 – 2019-02-05 (×13): 10 mL

## 2019-01-22 MED ORDER — MORPHINE SULFATE (PF) 2 MG/ML IV SOLN
2.0000 mg | INTRAVENOUS | Status: DC | PRN
  Administered 2019-01-22 – 2019-01-23 (×2): 4 mg via INTRAVENOUS
  Administered 2019-01-24: 2 mg via INTRAVENOUS
  Filled 2019-01-22 (×2): qty 2
  Filled 2019-01-22: qty 1

## 2019-01-22 MED ORDER — PERFLUTREN LIPID MICROSPHERE
1.0000 mL | INTRAVENOUS | Status: AC | PRN
  Administered 2019-01-22: 09:00:00 3 mL via INTRAVENOUS
  Filled 2019-01-22: qty 10

## 2019-01-22 MED ORDER — MORPHINE SULFATE (PF) 2 MG/ML IV SOLN
2.0000 mg | INTRAVENOUS | Status: DC | PRN
  Administered 2019-01-22 (×2): 2 mg via INTRAVENOUS
  Administered 2019-01-22: 1 mg via INTRAVENOUS
  Filled 2019-01-22 (×2): qty 1

## 2019-01-22 MED ORDER — PANTOPRAZOLE SODIUM 40 MG IV SOLR
40.0000 mg | Freq: Two times a day (BID) | INTRAVENOUS | Status: DC
  Administered 2019-01-22 – 2019-01-29 (×15): 40 mg via INTRAVENOUS
  Filled 2019-01-22 (×15): qty 40

## 2019-01-22 MED ORDER — SODIUM CHLORIDE 0.9% FLUSH: 10.0000 mL | INTRAVENOUS | Status: DC | PRN

## 2019-01-22 MED ORDER — ORAL CARE MOUTH RINSE
15.0000 mL | Freq: Two times a day (BID) | OROMUCOSAL | Status: DC
  Administered 2019-01-22 – 2019-02-09 (×20): 15 mL via OROMUCOSAL

## 2019-01-22 MED ORDER — THIAMINE HCL 100 MG/ML IJ SOLN
100.0000 mg | Freq: Every day | INTRAMUSCULAR | Status: DC
  Administered 2019-01-22 – 2019-01-29 (×8): 100 mg via INTRAVENOUS
  Filled 2019-01-22 (×8): qty 2

## 2019-01-22 MED ORDER — SODIUM CHLORIDE 0.9 % IV BOLUS
1000.0000 mL | Freq: Once | INTRAVENOUS | Status: AC
  Administered 2019-01-22: 1000 mL via INTRAVENOUS

## 2019-01-22 NOTE — Consult Note (Signed)
[facility] Consultation Note  Referring Provider: [facility] Primary Care Physician:  Patient, No Pcp Per  Reason for Consultation:  Abdominal pain, pancreatitis, elevated liver enzymes  HPI: Phillip Maldonado is a 68 y.o. male whom we were asked to see patient for above reasons.  In static state of Gi health until a few days ago, when patient began to have progressively worsening epigastric and right upper quadrant abdominal pain.  Liver enzymes increasing this admission, as did lipase.  Technically difficult studies due to the patient's body habitus, but no obvious biliary ductal dilatation on ultrasound or CT studies.  At present, patient has ongoing upper abdominal pain, but states that it is better than when he came in.  Was transferred yesterday from floor to ICU after having troubles with hypotension requiring pressors.  History multiple gastric reduction-type surgeries, looks most like revised Roux-en-Y gastrojejunostomy (but I don't think here, don't see operative reports), with history of GI bleeding with anastomotic ulcers.  No hematemesis, melena, or hematochezia.  Past Medical History:  Diagnosis Date  . Anemia   . Angina   . Anxiety   . Arthritis   . Atrial fibrillation with RVR ([place]) 01/24/2013  . Blood transfusion    last 10' 14- "GI bleed"  . CHF (congestive heart failure) ([place])   . CKD (chronic kidney disease) stage 3, GFR 30-59 ml/min 01/08/2016  . Depression   . Diabetes mellitus ([place]) 09/18/2011  . Diverticulosis   . DJD (degenerative joint disease)   . DM type 2, uncontrolled, with neuropathy ([place]) 12/26/2013  . Edema extremities    bilateral lower extremities-"weepy areas due to fluid retention"  . Fatty liver   . Fundic gland polyps of stomach, benign   . Headache(784.0)   . Hepatitis B   . History of alcohol abuse    last drink in 1993  . Hypertension   . Neuromuscular disorder ([place])   . 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  Obesity   . Obesity, morbid ([place]) 07/30/2015  . Pneumonia    not at present time  . Renal insufficiency   . Shortness of breath   . Sleep apnea, obstructive    does where bipap. 06-04-14 "doesn't use much now, no mask/tubing now.    Past Surgical History:  Procedure Laterality Date  . ABDOMINAL SURGERY     [phone]  . BACK SURGERY     x 8 -,multiple fusions(cervical to lumbar)  . COLONOSCOPY    . COLONOSCOPY WITH PROPOFOL N/A 06/14/2014   Procedure: COLONOSCOPY WITH PROPOFOL;  Surgeon: Gatha Mayer, MD;  Location: [facility];  Service: Endoscopy;  Laterality: N/A;  . DIAGNOSTIC LAPAROSCOPY    . ESOPHAGOGASTRODUODENOSCOPY N/A 01/03/2013   Procedure: ESOPHAGOGASTRODUODENOSCOPY (EGD);  Surgeon: Arta Silence, MD;  Location: [facility];  Service: Endoscopy;  Laterality: N/A;  . ESOPHAGOGASTRODUODENOSCOPY N/A 01/03/2013   Procedure: ESOPHAGOGASTRODUODENOSCOPY (EGD);  Surgeon: Arta Silence, MD;  Location: [facility];  Service: Endoscopy;  Laterality: N/A;  . ESOPHAGOGASTRODUODENOSCOPY N/A 01/23/2013   Procedure: ESOPHAGOGASTRODUODENOSCOPY (EGD);  Surgeon: Lear Ng, MD;  Location: [facility]COPY;  Service: Endoscopy;  Laterality: N/A;  . ESOPHAGOGASTRODUODENOSCOPY Left 01/27/2013   Procedure: ESOPHAGOGASTRODUODENOSCOPY (EGD);  Surgeon: Lear Ng, MD;  Location: [facility];  Service: Endoscopy;  Laterality: Left;  . ESOPHAGOGASTRODUODENOSCOPY N/A 06/14/2014   Procedure: ESOPHAGOGASTRODUODENOSCOPY (EGD);  Surgeon: Gatha Mayer, MD;  Location: [facility];  Service: Endoscopy;  Laterality: N/A;  . ESOPHAGOSCOPY N/A 01/01/2013   Procedure: ESOPHAGOSCOPY;  Surgeon: Jeryl Columbia, MD;  Location: [facility];  Service: Endoscopy;  Laterality: N/A;  . GASTRIC BYPASS  1977   Reversed in 1992 and revision 1994  . HYDROCELE EXCISION  1996   x 2   . KNEE SURGERY Left 2004    Prior to Admission medications   Medication Sig Start Date End Date Taking? Authorizing Provider   acetaminophen (TYLENOL) 650 MG CR tablet Take 2 tablets (1,300 mg total) by mouth 2 (two) times daily. 10/01/16  Yes [name], NP  amLODipine (NORVASC) 5 MG tablet Take 5 mg by mouth daily. 09/19/18  Yes [provider]  atorvastatin (LIPITOR) 40 MG tablet Take 1 tablet (40 mg total) by mouth daily. 02/22/17  Yes [name], NP  cholecalciferol (VITAMIN D3) 25 MCG (1000 UT) tablet Take 1,000 Units by mouth daily.   Yes [provider]  DULoxetine (CYMBALTA) 60 MG capsule TAKE 1 CAPSULE(60 MG) BY MOUTH TWICE DAILY Patient taking differently: Take 60 mg by mouth daily. TAKE 1 CAPSULE(60 MG) BY MOUTH TWICE DAILY 02/22/17  Yes [name], NP  Insulin Glargine (LANTUS SOLOSTAR) 100 UNIT/ML Solostar Pen inject 22-24 UNITS EVERY MORNING 02/22/17  Yes [name], NP  losartan (COZAAR) 100 MG tablet Take 100 mg by mouth daily. 11/03/18  Yes [provider]  metFORMIN (GLUCOPHAGE) 1000 MG tablet Take one tablet by mouth twice daily with a meal to control blood sugar 01/22/17  Yes [name], NP  metoprolol succinate (TOPROL-XL) 100 MG 24 hr tablet Take 100 mg by mouth daily. 09/22/18  Yes [provider]  Multiple Vitamin (MULITIVITAMIN WITH MINERALS) TABS Take 1 tablet by mouth daily.   Yes [provider]  pantoprazole (PROTONIX) 40 MG tablet TAKE 1 TABLET(40 MG) BY MOUTH TWICE DAILY Patient taking differently: Take 40 mg by mouth 2 (two) times daily. TAKE 1 TABLET(40 MG) BY MOUTH TWICE DAILY 02/22/17  Yes [name], NP  polyethylene glycol (MIRALAX / GLYCOLAX) packet Take 17 g by mouth daily as needed for mild constipation or moderate constipation.   Yes [provider]  polyvinyl alcohol (LIQUIFILM TEARS) 1.4 % ophthalmic solution Place 1 drop into both eyes as needed for dry eyes.   Yes [provider]  pregabalin (LYRICA) 150 MG capsule Take 150 mg by mouth 3 (three) times daily. 09/22/18  Yes  [provider]  torsemide (DEMADEX) 100 MG tablet Take 1 tablet (100 mg total) by mouth 2 (two) times daily. Patient taking differently: Take 50 mg by mouth daily.  11/03/16  Yes [name], NP  gabapentin (NEURONTIN) 300 MG capsule Take One to Two tablets by mouth three times daily as needed for pain. Patient not taking: Reported on 01/21/2019 02/22/17   [name], NP  glucose blood (ACCU-CHEK AVIVA PLUS) test strip 1 each by Other route See admin instructions. Check blood sugar twice daily    [provider]  lisinopril (PRINIVIL,ZESTRIL) 40 MG tablet TAKE 1 TABLET(40 MG) BY MOUTH DAILY Patient not taking: No sig reported 12/31/16   [name], NP  methadone (DOLOPHINE) 10 MG tablet Take 1 tablet (10 mg total) by mouth every 8 (eight) hours as needed. Patient not taking: Reported on 01/21/2019 01/14/17   Hollace Kinnier L, DO  metolazone (ZAROXOLYN) 2.5 MG tablet One daily to help reduce edema Patient not taking: Reported on 01/21/2019 02/22/17   [name], NP  metoprolol tartrate (LOPRESSOR) 50 MG tablet TAKE 1  TABLET(50 MG) BY MOUTH TWICE DAILY Patient not taking: Reported on 01/21/2019 02/22/17   [name], NP  naloxegol oxalate (MOVANTIK) 25 MG TABS tablet Take 1 tablet (25 mg total) by mouth daily. Patient not taking: Reported on 01/21/2019 02/22/17   [name], NP  nystatin cream (MYCOSTATIN) Apply to yeast dermatitis in groin daily Patient not taking: Reported on 01/21/2019 01/20/17   [name], NP  traZODone (DESYREL) 100 MG tablet TAKE 3 TABLETS BY MOUTH AT BEDTIME FOR REST Patient not taking: Reported on 01/21/2019 02/22/17   [name], NP  triamcinolone cream (KENALOG) 0.1 % Apply sparingly to rash up to twice daily Patient not taking: Reported on 01/21/2019 01/20/17   [name], NP    Current Facility-Administered Medications  Medication Dose Route Frequency Provider Last Rate Last Dose  .  bisacodyl (DULCOLAX) suppository 10 mg  10 mg Rectal Daily PRN Merton Border, MD      . Chlorhexidine Gluconate Cloth 2 % PADS 6 each  6 each Topical Daily Raiford Noble [place], DO   6 each at 01/22/19 0105  . DULoxetine (CYMBALTA) DR capsule 60 mg  60 mg Oral Daily Sheikh, Omair Latif, DO      . enoxaparin (LOVENOX) injection 80 mg  80 mg Subcutaneous Q24H Eudelia Bunch, RPH   80 mg at 01/21/19 2225  . influenza vaccine adjuvanted (FLUAD) injection 0.5 mL  0.5 mL Intramuscular Tomorrow-1000 Sheikh, Omair Latif, DO      . insulin aspart (novoLOG) injection 0-5 Units  0-5 Units Subcutaneous QHS Merton Border, MD   2 Units at 01/21/19 2233  . insulin aspart (novoLOG) injection 0-9 Units  0-9 Units Subcutaneous TID WC Merton Border, MD   2 Units at 01/22/19 1234  . insulin glargine (LANTUS) injection 20 Units  20 Units Subcutaneous Q2200 Merton Border, MD   20 Units at 01/21/19 2225  . lactated ringers infusion   Intravenous Continuous Raiford Noble [place], [place] 75 mL/hr at 01/22/19 [id number]    . levalbuterol (XOPENEX) nebulizer solution 0.63 mg  0.63 mg Nebulization Q6H PRN Raiford Noble Latif, DO      . morphine 2 MG/ML injection 2 mg  2 mg Intravenous Q4H PRN Raiford Noble [place], DO   1 mg at 01/22/19 0941  . norepinephrine (LEVOPHED) 4mg  in 273mL premix infusion  0-40 mcg/min Intravenous Titrated Jacalyn Lefevre, MD   Stopped at 01/22/19 [phone]  . ondansetron (ZOFRAN) tablet 4 mg  4 mg Oral Q6H PRN Merton Border, MD       Or  . ondansetron (ZOFRAN) injection 4 mg  4 mg Intravenous Q6H PRN Merton Border, MD      . pantoprazole (PROTONIX) injection 40 mg  40 mg Intravenous Q12H Sheikh, Georgina Quint [place], DO   40 mg at 01/22/19 0935  . piperacillin-tazobactam (ZOSYN) IVPB 3.375 g  3.375 g Intravenous Q8H Eudelia Bunch, RPH   Stopped at 01/22/19 [id number]  . polyethylene glycol (MIRALAX / GLYCOLAX) packet 17 g  17 g Oral Daily PRN Merton Border, MD      . simethicone (MYLICON) chewable tablet 80 mg  80 mg Oral QID Merton Border, MD    80 mg at 01/21/19 1702  . thiamine (B-1) injection 100 mg  100 mg Intravenous Daily Raiford Noble [place], DO   100 mg at 01/22/19 0935    Allergies as of 01/20/2019 - Review Complete 01/20/2019  Allergen Reaction Noted  . Latex Other (See Comments) and Rash 05/08/2011  .  Piroxicam Other (See Comments) and Swelling 05/08/2011  . Chlorzoxazone  05/04/2017  . Adhesive [tape] Other (See Comments) 05/08/2011    Family History  Problem Relation Age of Onset  . Heart disease Father   . Skin cancer Father   . COPD Father   . Hypertension Father   . Stroke Father   . Arthritis Mother   . Osteoporosis Mother   . Diabetes Daughter   . Depression Son   . Heart disease Maternal Uncle   . High blood pressure Sister   . High blood pressure Sister   . High blood pressure Son   . Depression Son   . Arthritis Sister   . Arthritis Sister   . Cancer Maternal Grandmother   . Heart disease Maternal Uncle   . Heart disease Paternal Uncle     Social History   Socioeconomic History  . Marital status: Divorced    Spouse name: Not on file  . Number of children: 6  . Years of education: Not on file  . Highest education level: Not on file  Occupational History  . Occupation: DISABLED  Social Needs  . Financial resource strain: Not on file  . Food insecurity    Worry: Not on file    Inability: Not on file  . Transportation needs    Medical: Not on file    Non-medical: Not on file  Tobacco Use  . Smoking status: Never Smoker  . Smokeless tobacco: Never Used  Substance and Sexual Activity  . Alcohol use: No    Alcohol/week: 0.0 standard drinks  . Drug use: No  . Sexual activity: Not Currently  Lifestyle  . Physical activity    Days per week: Not on file    Minutes per session: Not on file  . Stress: Not on file  Relationships  . Social Herbalist on phone: Not on file    Gets together: Not on file    Attends religious service: Not on file    Active member of club or  organization: Not on file    Attends meetings of clubs or organizations: Not on file    Relationship status: Not on file  . Intimate partner violence    Fear of current or ex partner: Not on file    Emotionally abused: Not on file    Physically abused: Not on file    Forced sexual activity: Not on file  Other Topics Concern  . Not on file  Social History Narrative   Patient is divorced, he has a total of 6 children 2 of which are deceased. He is a retired Conservation officer, historic buildings. History of alcoholism. No alcohol now. 2 caffeinated beverages daily. He moved to [place] in last 1-2 years.    Review of Systems: As per HPI, all others negative  Physical Exam: Vital signs in last 24 hours: Temp:  [97.4 F (36.3 C)-100.1 F (37.8 C)] 97.6 F (36.4 C) (11/08 0800) Pulse Rate:  [81-106] 96 (11/08 1245) Resp:  [18-30] 23 (11/08 1245) BP: (82-127)/(37-73) 110/53 (11/08 1000) SpO2:  [90 %-100 %] 92 % (11/08 1245) Arterial Line BP: (100-118)/(43-64) 118/61 (11/08 1245) Weight:  [161 kg-163.4 kg] 163.4 kg (11/08 1227) Last BM Date: 01/19/19 General:   Alert,  Morbidly obese, uncomfortable-appearing Head:  Normocephalic and atraumatic. Eyes:  Sclera clear, no icterus.   Conjunctiva pink. Ears:  Normal auditory acuity. Nose:  No deformity, discharge,  or lesions. Mouth:  No deformity or lesions.  Oropharynx  pink & moist. Neck:  Supple; no masses or thyromegaly. Lungs:  Clear throughout to auscultation.   No wheezes, crackles, or rhonchi. No acute distress. Heart:  Regular rate and rhythm; no murmurs, clicks, rubs,  or gallops. Abdomen:  Soft, morbidly obese, protuberant, mild epigastric and right upper quadrant tenderness; no peritonitis No masses, hepatosplenomegaly or hernias noted.     Msk:  Symmetrical without gross deformities. Normal posture. Pulses:  Normal pulses noted. Extremities:  Without clubbing or edema. Neurologic:  Alert and  oriented x4;  Diffusely weak, otherwise grossly  normal neurologically. Skin:  Intact without significant lesions or rashes.. Psych:  Alert and cooperative. Normal mood and affect.   Lab Results: Recent Labs    01/21/19 0827 01/21/19 1817 01/22/19 0102  WBC 22.2* 18.1* 20.5*  HGB 15.4 13.4 12.3*  HCT 49.0 43.1 38.1*  PLT 279 239 239   BMET Recent Labs    01/21/19 0827 01/21/19 1817 01/22/19 0102  NA 135 136 133*  K 3.7 4.1 3.6  CL 93* 95* 97*  CO2 25 24 24   GLUCOSE 245* 265* 212*  BUN 18 27* 33*  CREATININE 1.18 2.25* 2.99*  CALCIUM 9.0 8.6* 7.8*   LFT Recent Labs    01/22/19 0102  PROT 6.6  ALBUMIN 3.1*  AST 277*  ALT 230*  ALKPHOS 102  BILITOT 5.3*   PT/INR Recent Labs    01/20/19 1114 01/21/19 2305  LABPROT 12.8 16.8*  INR 1.0 1.4*    Studies/Results: US Abdomen Complete  Result Date: 01/21/2019 CLINICAL DATA:  Right upper quadrant pain. EXAM: ABDOMEN ULTRASOUND COMPLETE COMPARISON:  CT abdomen pelvis 01/20/2019 FINDINGS: Extremely limited exam due to patient body habitus. Gallbladder: Poorly visualized. The wall may measure up to 0.5 cm in diameter insert spots. No definite calculi. Negative sonographic Murphy sign. The Common bile duct: Diameter: Not visualized Liver: Diffuse increased echogenicity of the liver. No definite focal lesion. The portal vein appears patent and in normal direction. IVC: No abnormality visualized. Pancreas: Not visualized. Spleen: Unremarkable.  Poorly visualized. Right Kidney: Length: 14.4 cm.  No definite hydronephrosis. Left Kidney: Length: 13.2 cm.  No definite hydronephrosis. Abdominal aorta: Not visualized. Other findings: None. IMPRESSION: 1. Extremely limited exam due to patient body habitus. Many structures were not visualized at all. 2. Diffusely increased echogenicity of the liver most commonly seen with hepatic steatosis. 3.  Possible gallbladder wall thickening but poorly assessed. Electronically Signed   By: Audie Pinto M.D.   On: 01/21/2019 14:58   Ct  Abdomen Pelvis W Contrast  Result Date: 01/20/2019 CLINICAL DATA:  Abdominal pain EXAM: CT ABDOMEN AND PELVIS WITH CONTRAST TECHNIQUE: Multidetector CT imaging of the abdomen and pelvis was performed using the standard protocol following bolus administration of intravenous contrast. CONTRAST:  136mL OMNIPAQUE IOHEXOL 300 MG/ML  SOLN COMPARISON:  None. FINDINGS: Lower chest: Subsegmental atelectasis. Hepatobiliary: Stable cyst of the left hepatic lobe. Gallbladder distention is unchanged. Pancreas: Unremarkable. No pancreatic ductal dilatation or surrounding inflammatory changes. Spleen: Unremarkable within limitation of streak artifact surgical clips. Adrenals/Urinary Tract: Adrenals are unremarkable. Small right renal cyst. Partially distended bladder is unremarkable. Stomach/Bowel: Postoperative changes of gastric bypass and reversal per medical record. Colonic diverticulosis without evidence of diverticulitis. Vascular/Lymphatic: No adenopathy. Reproductive: Unremarkable. Other: No ascites. Musculoskeletal: Postoperative changes of posterior lumbar spine fusion. Advanced multilevel degenerative changes. IMPRESSION: No findings to account for reported symptoms. Colonic diverticulosis without evidence of diverticulitis. Electronically Signed   By: Macy Mis M.D.   On: 01/20/2019 16:26  Dg Chest Port 1 View  Result Date: 01/21/2019 CLINICAL DATA:  Central line placement EXAM: PORTABLE CHEST 1 VIEW COMPARISON:  January 21, 2019 FINDINGS: There is a right-sided central venous catheter in place, the tip of which is difficult to fully assess secondary to overlapping osseous structures. There is no pneumothorax. There are worsening bibasilar airspace opacities. Slight interval widening of the upper mediastinum is favored to be projectional. The heart size is enlarged. Extensive surgical clips are noted in the left upper quadrant. IMPRESSION: Right-sided central venous catheter as above with no evidence for  right-sided pneumothorax. The tip of the catheter is difficult to evaluate secondary to overlapping osseous structures. Consider repeat radiograph as clinically indicated. Worsening bibasilar airspace disease. Electronically Signed   By: Constance Holster M.D.   On: 01/21/2019 22:45   Dg Chest Port 1 View  Result Date: 01/21/2019 CLINICAL DATA:  Hypertension, shortness of breath EXAM: PORTABLE CHEST 1 VIEW COMPARISON:  01/20/2019 FINDINGS: Mild right basilar atelectasis. Lungs are essentially clear. No pleural effusion or pneumothorax. Cysts Mild cardiomegaly. Cervical spine fixation hardware. IMPRESSION: Mild right basilar atelectasis. No evidence of acute cardiopulmonary disease. Electronically Signed   By: Julian Hy M.D.   On: 01/21/2019 19:23    Impression:  1.  Pancreatitis with elevated LFTs.  Suspected biliary, yet no biliary ductal dilatation on imaging (U/S, CT) and no obvious gallstones either on most recent studies (though quality of films in light of patient's body habitus is rather low). 2.  Hypotension, requiring pressors, sepsis-type syndrome? 3.  Abdominal pain, likely from #1 above. 4.  Morbid obesity with history of gastric surgery.  I can't locate operative report, but based on appearance on Dr. 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 5.  History gastric marginal ulcer bleeding in past.  Hgb normal.  No overt GI tract bleeding.  Plan:  1.  Supportive care:  IVF, pressor support as needed, antibiotics. 2.  MRCP ordered for today.  HIDA also ordered by surgical team. 3.  Follow LFTs. 4.  Patient has followed with both [facility] and [facility] in office for GI care, most recently with Dr. Carlean Purl in [place] in 2016, after patient was dismissed from [facility] in 2015.  I saw patient today because it is a biliary case and I am the biliary-covering physician this weekend.   5.  Pending MRCP findings, if ERCP needed, would need to discuss  with Dr. Rush [facility], if they pick up patient tomorrow) or Dr. Watt Climes [facility], if [facility] follows patient during this admission), both of whom have the most experience in ERCP procedures in patients who have altered gastric anatomy.   LOS: 2 days   Aaryn Parrilla M  01/22/2019, 1:34 PM  Cell [phone] If no answer or after 5 PM call [phone]

## 2019-01-22 NOTE — Progress Notes (Signed)
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       Office: [phone]   Chief Complaint: Abdominal pain  Subjective: Patient in ICU, awake, responsive.  Persistent right mid abdominal pain, more severe than yesterday.  Denies nausea or emesis.  Objective: Vital signs in last 24 hours: Temp:  [97.4 F (36.3 C)-100.1 F (37.8 C)] 97.6 F (36.4 C) (11/08 0800) Pulse Rate:  [81-106] 95 (11/08 1000) Resp:  [18-30] 22 (11/08 1000) BP: (82-127)/(37-73) 110/53 (11/08 1000) SpO2:  [90 %-100 %] 90 % (11/08 1000) Arterial Line BP: (100-115)/(43-64) 106/60 (11/08 1000) Weight:  [161 kg] 161 kg (11/07 1918) Last BM Date: 01/19/19  Intake/Output from previous day: 11/07 0701 - 11/08 0700 In: 2043.3 [I.V.:1633.2; IV Piggyback:410.1] Out: 375 [Urine:375] Intake/Output this shift: Total I/O In: 363 [I.V.:346.8; IV Piggyback:16.2] Out: 50 [Urine:50]  Physical Exam: HEENT - sclerae clear, mucous membranes moist Neck - soft, right IJ line in place Abdomen - soft, obese; tender in right mid abdomen, epigastrium; no mass Neuro - alert & oriented, no  focal deficits  Lab Results:  Recent Labs    01/21/19 1817 01/22/19 0102  WBC 18.1* 20.5*  HGB 13.4 12.3*  HCT 43.1 38.1*  PLT 239 239   BMET Recent Labs    01/21/19 1817 01/22/19 0102  NA 136 133*  K 4.1 3.6  CL 95* 97*  CO2 24 24  GLUCOSE 265* 212*  BUN 27* 33*  CREATININE 2.25* 2.99*  CALCIUM 8.6* 7.8*   PT/INR Recent Labs    01/20/19 1114 01/21/19 2305  LABPROT 12.8 16.8*  INR 1.0 1.4*   Comprehensive Metabolic Panel:    Component Value Date/Time   NA 133 (L) 01/22/2019 0102   NA 136 01/21/2019 1817   NA 139 02/12/2015 1424   NA 142 10/11/2014 0833   K 3.6 01/22/2019 0102   K 4.1 01/21/2019 1817   CL 97 (L) 01/22/2019 0102   CL 95 (L) 01/21/2019 1817   CO2 24 01/22/2019 0102   CO2 24 01/21/2019 1817   BUN 33 (H) 01/22/2019 0102   BUN 27 (H) 01/21/2019 1817   BUN 15 02/12/2015 1424   BUN 18 10/11/2014 0833   CREATININE 2.99 (H) 01/22/2019 0102   CREATININE 2.25 (H) 01/21/2019 1817   CREATININE 1.14 10/01/2016 1504   CREATININE 1.25 06/10/2016 1535   GLUCOSE 212 (H) 01/22/2019 0102   GLUCOSE 265 (H) 01/21/2019 1817   CALCIUM 7.8 (L) 01/22/2019 0102   CALCIUM 8.6 (L) 01/21/2019 1817   AST 277 (H) 01/22/2019 0102   AST 449 (H) 01/21/2019 1817   ALT 230 (H) 01/22/2019 0102   ALT 298 (H) 01/21/2019 1817   ALKPHOS 102 01/22/2019  0102   ALKPHOS 116 01/21/2019 1817   BILITOT 5.3 (H) 01/22/2019 0102   BILITOT 5.5 (H) 01/21/2019 1817   BILITOT 0.4 10/11/2014 0833   BILITOT 0.4 05/16/2014 1305   PROT 6.6 01/22/2019 0102   PROT 7.1 01/21/2019 1817   PROT 6.7 10/11/2014 0833   PROT 7.1 05/16/2014 1305   ALBUMIN 3.1 (L) 01/22/2019 0102   ALBUMIN 3.4 (L) 01/21/2019 1817   ALBUMIN 3.8 10/11/2014 0833   ALBUMIN 4.0 05/16/2014 1305    Studies/Results: US Abdomen Complete  Result Date: 01/21/2019 CLINICAL DATA:  Right upper quadrant pain. EXAM: ABDOMEN ULTRASOUND COMPLETE COMPARISON:  CT abdomen pelvis 01/20/2019 FINDINGS: Extremely limited exam due  to patient body habitus. Gallbladder: Poorly visualized. The wall may measure up to 0.5 cm in diameter insert spots. No definite calculi. Negative sonographic Murphy sign. The Common bile duct: Diameter: Not visualized Liver: Diffuse increased echogenicity of the liver. No definite focal lesion. The portal vein appears patent and in normal direction. IVC: No abnormality visualized. Pancreas: Not visualized. Spleen: Unremarkable.  Poorly visualized. Right Kidney: Length: 14.4 cm.  No definite hydronephrosis. Left Kidney: Length: 13.2 cm.  No definite hydronephrosis. Abdominal aorta: Not visualized. Other findings: None. IMPRESSION: 1. Extremely limited exam due to patient body habitus. Many structures were not visualized at all. 2. Diffusely increased echogenicity of the liver most commonly seen with hepatic steatosis. 3.  Possible gallbladder wall thickening but poorly assessed. Electronically Signed   By: Audie Pinto M.D.   On: 01/21/2019 14:58   Ct Abdomen Pelvis W Contrast  Result Date: 01/20/2019 CLINICAL DATA:  Abdominal pain EXAM: CT ABDOMEN AND PELVIS WITH CONTRAST TECHNIQUE: Multidetector CT imaging of the abdomen and pelvis was performed using the standard protocol following bolus administration of intravenous contrast. CONTRAST:  120mL OMNIPAQUE IOHEXOL 300 MG/ML  SOLN COMPARISON:  None. FINDINGS: Lower chest: Subsegmental atelectasis. Hepatobiliary: Stable cyst of the left hepatic lobe. Gallbladder distention is unchanged. Pancreas: Unremarkable. No pancreatic ductal dilatation or surrounding inflammatory changes. Spleen: Unremarkable within limitation of streak artifact surgical clips. Adrenals/Urinary Tract: Adrenals are unremarkable. Small right renal cyst. Partially distended bladder is unremarkable. Stomach/Bowel: Postoperative changes of gastric bypass and reversal per medical record. Colonic diverticulosis without evidence of diverticulitis. Vascular/Lymphatic: No adenopathy.  Reproductive: Unremarkable. Other: No ascites. Musculoskeletal: Postoperative changes of posterior lumbar spine fusion. Advanced multilevel degenerative changes. IMPRESSION: No findings to account for reported symptoms. Colonic diverticulosis without evidence of diverticulitis. Electronically Signed   By: Macy Mis M.D.   On: 01/20/2019 16:26   Dg Chest Port 1 View  Result Date: 01/21/2019 CLINICAL DATA:  Central line placement EXAM: PORTABLE CHEST 1 VIEW COMPARISON:  January 21, 2019 FINDINGS: There is a right-sided central venous catheter in place, the tip of which is difficult to fully assess secondary to overlapping osseous structures. There is no pneumothorax. There are worsening bibasilar airspace opacities. Slight interval widening of the upper mediastinum is favored to be projectional. The heart size is enlarged. Extensive surgical clips are noted in the left upper quadrant. IMPRESSION: Right-sided central venous catheter as above with no evidence for right-sided pneumothorax. The tip of the catheter is difficult to evaluate secondary to overlapping osseous structures. Consider repeat radiograph as clinically indicated. Worsening bibasilar airspace disease. Electronically Signed   By: Constance Holster M.D.   On: 01/21/2019 22:45   Dg Chest Port 1 View  Result Date: 01/21/2019 CLINICAL DATA:  Hypertension, shortness of breath EXAM: PORTABLE CHEST 1 VIEW COMPARISON:  01/20/2019 FINDINGS: Mild right  basilar atelectasis. Lungs are essentially clear. No pleural effusion or pneumothorax. Cysts Mild cardiomegaly. Cervical spine fixation hardware. IMPRESSION: Mild right basilar atelectasis. No evidence of acute cardiopulmonary disease. Electronically Signed   By: Julian Hy M.D.   On: 01/21/2019 19:23   Dg Chest Portable 1 View  Result Date: 01/20/2019 CLINICAL DATA:  Chest pain. EXAM: PORTABLE CHEST 1 VIEW COMPARISON:  07/30/2015. FINDINGS: Patient's head obscures the superior mediastinum.  Trachea is midline. Heart is enlarged and accentuated by AP technique. Minimal streaky atelectasis or scarring in the left lung base. No airspace consolidation or pleural fluid. IMPRESSION: Streaky atelectasis or scarring in the lower left hemithorax. Electronically Signed   By: Lorin Picket M.D.   On: 01/20/2019 10:51      Armandina Gemma 01/22/2019  Patient ID: Phillip Maldonado, male   DOB: 04/24/1950, 68 y.o.   MRN: [id number]

## 2019-01-22 NOTE — Progress Notes (Signed)
Patient transported to [facility] for MRCP via [facility]. [facility] was given report. Wife remains at bedside

## 2019-01-22 NOTE — Procedures (Signed)
Central Venous Catheter Insertion Procedure Note Phillip Maldonado [id number] 1950/12/09  Procedure: Insertion of Central Venous Catheter Indications: Drug and/or fluid administration  Procedure Details Consent: Verbally consented for procedure Time Out: Verified patient identification, verified procedure, site/side was marked, verified correct patient position, special equipment/implants available, medications/allergies/relevent history reviewed, required imaging and test results available.  Performed  Maximum sterile technique was used including antiseptics, cap, gloves, gown, hand hygiene, mask and sheet. Skin prep: Chlorhexidine; local anesthetic administered A antimicrobial bonded/coated triple lumen catheter was placed in the right internal jugular vein using the Seldinger technique.  Evaluation Blood flow good Complications: No apparent complications Patient did tolerate procedure well. Chest X-ray ordered to verify placement.  CXR: normal.  Phillip Maldonado T Phillip Maldonado 01/22/2019, 5:57 AM

## 2019-01-22 NOTE — Progress Notes (Addendum)
Chart was reviewed this morning and patient decompensated overnight and ended up becoming ICU status around 10 PM.  Critical care was consulted and a central line and arterial line was placed and patient was started on pressors due to septic shock likely from an abdominal source.  Patient is now in acute renal failure as well as having abnormal LFTs and hyperbilirubinemia.  Possibly from an obstructive gall stone.  GI and general surgery were both consulted.  I spoke with Dr. Chase Caller of [facility] who will manage the patient today if the patient is not off of Pressors by 10:00 AM but if is off of Pressors by then [facility] to manage today.

## 2019-01-22 NOTE — Progress Notes (Signed)
CRITICAL VALUE ALERT  Critical Value:  Lactic Acid 2.1  Date & Time Notied:  01/22/2019 0015  Provider Notified: [facility]  Orders Received/Actions taken: No new orders at this time

## 2019-01-22 NOTE — Progress Notes (Signed)
  Echocardiogram 2D Echocardiogram has been performed.  Phillip Maldonado 01/22/2019, 8:54 AM

## 2019-01-22 NOTE — Progress Notes (Signed)
[facility] Progress Note Patient Name: HUIE BOBIER DOB: Dec 17, 1950 MRN: [id number]   Date of Service  01/22/2019  HPI/Events of Note  Multiple issues: 1. Hypotension - BP = 82/40 with MAP = 53 and 2. Lactic Acid = 2.1. Hgb = 13.4.   eICU Interventions  Will order" 1. CVP monitoring now and Q 4 hours.  2. Bolus with 0.9 NaCl 1 liter IV over 1 hour now.      Intervention Category Major Interventions: Acid-Base disturbance - evaluation and management  Sommer,Steven Eugene 01/22/2019, 12:47 AM

## 2019-01-22 NOTE — Progress Notes (Signed)
   D/w Dr Alfredia Ferguson - and RN   - patient came off pressors this am  - on  3LC - [facility]  Per [facility] - CCM can sign off     SIGNATURE    Dr. Brand Males, M.D., F.C.C.P,  Pulmonary and Critical Care Medicine Staff Physician, [place] Director - Interstitial Lung Disease  Program  Pulmonary [place] at [place], [place], [address]  Pager: [phone], If no answer or between  15:00h - 7:00h: call [phone] Telephone: [phone]  2:00 PM 01/22/2019

## 2019-01-22 NOTE — Progress Notes (Signed)
PROGRESS NOTE    Phillip Maldonado  [name] DOB: January 24, 1951 DOA: 01/20/2019 PCP: Patient, No Pcp Per   Brief Narrative:  HPI per Dr. Merton Border on 01/20/2019  Phillip Maldonado  is a 68 y.o. 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 fib, chronic abdominal pain presenting with acute onset of epigastric pain radiating to the right upper chest associated with nausea vomiting and no diarrhea.  The pain was radiating to the right chest and the patient had full work-up in the emergency room including an EKG, troponins CT of abdomen and pelvis with no significant abnormalities noted.  Patient denies any history of blood in his urine or stools.  Patient had subjective fevers early in the morning at 430 when he wake up with the abdominal pain.  Patient is on chronic pain medications but taking MiraLAX as well. Patient denies any cough or shortness of breath. Patient was recently treated for upper GI bleed and case discussed with Dr. Michail Sermon from GI. Work-up in the emergency room showed hyperglycemia otherwise most of his blood work is full and within the normal range when showed colonic diverticulosis  **Interim History Patient was still complaining of abdominal pain and WBC worsened.  CT was unrevealing but will obtain a right upper quadrant ultrasound as well as an abdominal ultrasound.  General surgery was consulted for further evaluation recommendations.  Throughout the day yesterday progressively started worsening and ended up going to the ICU and subsequently ended up in septic shock.  A central line was placed last night and was an arterial line in his given pressors.  Patient had acute liver injury as well as an acute kidney injury in the setting of his shock.  LFTs started elevating and lipase elevated to and likely suspected the patient had a gallstone pancreatitis secondary to obstructive pathologies.  Is jaundiced  a little bit this morning.  Critical care is signed off the case now that he is off of pressors.  We will be obtaining a MRCP and have general surgery as well as gastroenterology weigh in further.  Patient is no longer requiring pressors at this time but blood pressure is on the lower side and will continue to monitor carefully.  He is now also having an acute kidney injury on CKD stage III and acute liver injury.  Assessment & Plan:   Active Problems:   History of gastric bypass   History of marginal ulcers   DM type 2, uncontrolled, with neuropathy (HCC)   CKD (chronic kidney disease) stage 3, GFR 30-59 ml/min   Abdominal pain   Septic shock (HCC)   Encounter for central line placement   BMI 50.0-59.9, adult (HCC)   Elevated LFTs   Elevated amylase and lipase   Septic shock in the setting of right upper quadrant abdominal pain associated nausea vomiting and concern for acute cholecystitis and ? choledocholithiasis with obstructive jaundice and Gallstone pancreatitis -Decompensated yesterday and was transferred to the [facility] admission his WBC was 10.8 and had worsened to 22.2 and then slowly improved to 18.1 but is now 20,500 -Lactic acid level has trended down is now 1.3 -Given 2-1/2 L of fluid boluses and will be continued on maintenance IV fluid -Was made n.p.o. given his pancreatitis and his lipase level trended up to 1054 and is now down to 219 -Start empiric antibiotics with IV Zosyn yesterday -CT of the Abdomen and Pelvis showed "No findings to account for reported  symptoms. Colonic diverticulosis without evidence of diverticulitis."  However the report did mention a distended gallbladder -CT Abd and Pelvis reviewd from 09/28/2018 note showed Cholelithiasis but no evidence of Acute Cholecystitis -Right upper quadrant ultrasound showed "Extremely limited exam due to patient body habitus. Many structures were not visualized at all.  Diffusely increased echogenicity of the liver most  commonly seen with hepatic steatosis.  Possible gallbladder wall thickening but poorly assessed." -I have asked general surgery to consult and they did not feel that was a surgical issue yesterday but now they feel that they could have possible acute cholecystitis and suspected a gallstone and a common bile duct -Dr. Harlow Asa recommends and if MRI demonstrates acute cholecystitis he may require percutaneous cholecystostomy tube given his leukocytosis and pancreatitis as this is not optimal timing for intervention at present -Patient was pancultured yesterday -IV fluids were started lactated Ringer's at 71 mL's per hour we will continue -Subsequently patient went into shock and had to have a central line placed and started on pressors with Levophed which has now been weaned off.  He was given 2-1/2 L of fluid boluses yesterday -Diet was made n.p.o. given his acute pancreatitis -Pain control with IV morphine 2 mg every 4 hours -Patient was still having abdominal pain on palpation in the right upper quadrant had positive Murphy sign on my examination yesterday -MRCP done and showed "Motion degraded images. Mild peripancreatic fluid along the pancreatic head/uncinate process, likely related to the patient's known acute pancreatitis. No drainable fluid collection/pseudocyst. Cholelithiasis. Mild gallbladder wall thickening, possibly reflecting secondary inflammatory changes given acute pancreatitis. No intrahepatic or extrahepatic ductal dilatation. Common duct measures 4 mm. No choledocholithiasis is seen." -I suspect the patient may have had an obstructive biliary stone which is now passed given that trending down of his LFTs and T bili as well as a trending down of his lipase -Gastroenterology was consulted for further evaluation and Dr. Paulita Fujita recommends continuing supportive care with IV fluids and pressor support as needed -Continue with antibiotics with IV Zosyn -Dr. Paulita Fujita also recommends continue to  follow LFTs and recommends may be an ERCP pending the MRCP findings but do not know if they will actually pursue an CP at this point and will defer to them -Continue monitor temperature curve as well as daily CMP  Acute Respiratory Failure with Hypoxia in the setting of septic shock from likely above -Continuous pulse oximetry and maintain O2 saturation greater than 90% -Continue supplemental oxygen via nasal cannula and wean O2 as tolerated -Started Xopenex and Atrovent breathing treatments -Pulmonary has signed off the case  Hyperbilirubinemia, Abnormal LFTs and Obstructive jaundice -As above -Obtained a an abdominal ultrasound as above -Acute hepatitis panel was negative -Patient's AST went from 24 and trended all the way up to 449 and ALT trended up from 24 and went elevated to 98 -Now AST is 277 and trended down and now ALT is now 230 -Continue to monitor and trend as patient's T bili went from 0.8 is now 5.5 is now trending down at 5.3 -MRCP as above and showed no choledocholithiasis -Continue monitor and trend hepatic function panel and repeat CMP in a.m.  Uncontrolled Diabetes Mellitus Type 2 complicated by Diabetic Neuropathy -Continue with gabapentin 300 mg p.o. 3 times daily as needed -Hold his Metformin 1000 g p.o. twice daily -His hemoglobin A1c was 8.6 -CBGs have been ranging from 166-319;  -Continue with sensitive NovoLog sliding scale insulin before meals and at bedtime along with Lantus 20  units subcu daily at 10 PM -May need to consult diabetes education coordinator and will continue to monitor blood sugars carefully  Hypertensive Urgency, improved and now he became hypotensive in the setting of shock -Hypertensive urgency likely likely in the setting of abdominal pain but then became hypotensive in the setting of shock -Hold all antihypertensives at this time given his low blood pressure and he had just come off of pressors this morning  GERD and Hx of GI  Bleed -Given famotidine 20 mg IV once yesterday with pantoprazole 40 mg IV every 12h  AKI on chronic Kidney Disease Stage 3 -Stable  -BUN/creatinine went from 14/0.92 is now significantly worsened to 33/2.99 in the setting of hypotension -Avoid nephrotoxic medications if possible, contrast dyes as well as hypotension -Patient's nephrotoxic medications have been held and antihypertensives have been held -Changed D5 NS to LR at 75 mL/hr for now and will continue; patient received 2-1/2 L fluid boluses since yesterday -Repeat CMP in a.m.  Chronic diastolic CHF -Unknown Type as unable to view ECHO in our system -Appears fairly compensated and appears slightly dry now -Held lisinopril 40 mg p.o. daily, metoprolol 51 p.o. twice daily and torsemide 100 mg p.o. twice daily has been reduced down to 200 mg p.o. daily given his hypotension and shock -Strict I's and O's and daily weights -We will continue to monitor for signs and symptoms of volume overload as patient is getting IV fluid hydration with lactated Ringer's at 75 mL's per hour -Repeat echocardiogram showed an EF of 60 to 65% with grade 1 diastolic dysfunction  HLD/Hypercholesterolemia -Held atorvastatin 40 mg p.o. nightly given his Hepatic dysfunction  History of Atrial Fibrillation -TSH was 2.396 -Now normal sinus rhythm with rate controlled -Continue to monitor on telemetry  History of Alcoholism and Gastric Bypass -IV Thiamine started at 100 mg q24h  History of Chronic Pain -Upon further review Patient no longer taking  Methadone 10 mg every 8 hours; -C/w as needed IV morphine 2 mg every 8 hours as needed -Continue with bowel regimen as above and patient also takes Movantik at home which was held  Morbid Obesity -Estimated body mass index is 53.2 kg/m as calculated from the following:   Height as of this encounter: 5\' 9"  (1.753 m).   Weight as of this encounter: 163.4 kg. -Weight Loss and Dietary Counseling given      DVT prophylaxis: Enoxaparin 40 mg subcu every 24 Code Status: FULL CODE Family Communication: No family present at bedside  Disposition Plan: Pending further improvement and evaluation by general surgery, gastroenterology improved back to baseline   Consultants:   General Surgery  Gastroenterology  Pulmonary  Procedures:  Central line placed and patient underwent MRCP  ECHOCARDIOGRAM IMPRESSIONS    1. Left ventricular ejection fraction, by visual estimation, is 60 to 65%. The left ventricle has normal function. There is no left ventricular hypertrophy.  2. Left ventricular diastolic parameters are consistent with Grade I diastolic dysfunction (impaired relaxation).  3. Global right ventricle has normal systolic function.The right ventricular size is normal. No increase in right ventricular wall thickness.  4. Left atrial size was mildly dilated.  5. Right atrial size was normal.  6. Moderate mitral annular calcification.  7. The mitral valve is normal in structure. No evidence of mitral valve regurgitation. No evidence of mitral stenosis.  8. The tricuspid valve is normal in structure. Tricuspid valve regurgitation is not demonstrated.  9. The aortic valve is tricuspid. Aortic valve regurgitation is not  visualized. Mild aortic valve sclerosis without stenosis. 10. The pulmonic valve was normal in structure. Pulmonic valve regurgitation is not visualized. 11. The inferior vena cava is normal in size with greater than 50% respiratory variability, suggesting right atrial pressure of 3 mmHg. 12. The interatrial septum was not well visualized.  FINDINGS  Left Ventricle: Left ventricular ejection fraction, by visual estimation, is 60 to 65%. The left ventricle has normal function. There is no left ventricular hypertrophy. Left ventricular diastolic parameters are consistent with Grade I diastolic  dysfunction (impaired relaxation). Normal left atrial pressure.  Right  Ventricle: The right ventricular size is normal. No increase in right ventricular wall thickness. Global RV systolic function is has normal systolic function.  Left Atrium: Left atrial size was mildly dilated.  Right Atrium: Right atrial size was normal in size  Pericardium: There is no evidence of pericardial effusion.  Mitral Valve: The mitral valve is normal in structure. There is mild thickening of the mitral valve leaflet(s). There is mild calcification of the mitral valve leaflet(s). Moderate mitral annular calcification. No evidence of mitral valve stenosis by  observation. No evidence of mitral valve regurgitation.  Tricuspid Valve: The tricuspid valve is normal in structure. Tricuspid valve regurgitation is not demonstrated.  Aortic Valve: The aortic valve is tricuspid. Aortic valve regurgitation is not visualized. Mild aortic valve sclerosis is present, with no evidence of aortic valve stenosis.  Pulmonic Valve: The pulmonic valve was normal in structure. Pulmonic valve regurgitation is not visualized.  Aorta: The aortic root, ascending aorta and aortic arch are all structurally normal, with no evidence of dilitation or obstruction.  Venous: The inferior vena cava is normal in size with greater than 50% respiratory variability, suggesting right atrial pressure of 3 mmHg.  IAS/Shunts: The interatrial septum was not well visualized. No ventricular septal defect is seen or detected. There is no evidence of an atrial septal defect.     LEFT VENTRICLE PLAX 2D LVIDd:         5.70 cm  Diastology LVIDs:         3.30 cm  LV e' lateral:   5.87 cm/s LV PW:         1.00 cm  LV E/e' lateral: 13.1 LV IVS:        1.00 cm  LV e' medial:    5.22 cm/s LVOT diam:     2.10 cm  LV E/e' medial:  14.8 LV SV:         116 ml LV SV Index:   40.08 LVOT Area:     3.46 cm    RIGHT VENTRICLE RV S prime:     17.70 cm/s TAPSE (M-mode): 2.9 cm  LEFT ATRIUM             Index LA diam:         4.20 cm 1.59 cm/m LA Vol (A2C):   42.2 ml 16.01 ml/m LA Vol (A4C):   52.0 ml 19.72 ml/m LA Biplane Vol: 51.6 ml 19.57 ml/m  AORTIC VALVE LVOT Vmax:   157.00 cm/s LVOT Vmean:  102.000 cm/s LVOT VTI:    0.220 m   AORTA Ao Root diam: 3.50 cm  MITRAL VALVE MV Area (PHT): 3.46 cm              SHUNTS MV PHT:        63.51 msec            Systemic VTI:  0.22 m MV Decel Time: 219 msec  Systemic Diam: 2.10 cm MV E velocity: 77.10 cm/s  103 cm/s MV A velocity: 121.00 cm/s 70.3 cm/s MV E/A ratio:  0.64        1.5   Antimicrobials:  Anti-infectives (From admission, onward)   Start     Dose/Rate Route Frequency Ordered Stop   01/21/19 1400  piperacillin-tazobactam (ZOSYN) IVPB 3.375 g     3.375 g 12.5 mL/hr over 240 Minutes Intravenous Every 8 hours 01/21/19 1325       Subjective: Seen and examined at bedside and he was little somnolent when I saw him this morning but was complaining of some abdominal pain.  Still felt "rough" and had a difficult night.  No nausea or vomiting currently.  No other concerns reported at this time  Objective: Vitals:   01/22/19 0945 01/22/19 1000 01/22/19 1227 01/22/19 1245  BP: (!) 89/54 (!) 110/53    Pulse: 93 95  96  Resp: (!) 28 (!) 22  (!) 23  Temp:      TempSrc:      SpO2: 92% 90%  92%  Weight:   (!) 163.4 kg   Height:        Intake/Output Summary (Last 24 hours) at 01/22/2019 1420 Last data filed at 01/22/2019 1354 Gross per 24 hour  Intake 2406.29 ml  Output 325 ml  Net 2081.29 ml   Filed Weights   01/20/19 1023 01/21/19 1918 01/22/19 1227  Weight: (!) 152 kg (!) 161 kg (!) 163.4 kg   Examination: Physical Exam:  Constitutional: WN/WD morbidly obese Caucasian male in some mild distress Eyes:  Lids and conjunctivae normal, sclerae is icteric  ENMT: External Ears, Nose appear normal. Grossly normal hearing.  Neck: Appears normal, supple, no cervical masses, normal ROM, no appreciable thyromegaly; no  JVD Respiratory: Diminished to auscultation bilaterally, no wheezing, rales, rhonchi or crackles. Normal respiratory effort and patient is not tachypenic. No accessory muscle use. Wearing 3 Liters of Supplemental O2 Cardiovascular: Tachycardic Rate but regular rhythm, no murmurs / rubs / gallops. S1 and S2 auscultated. 1+ extremity edema.  Abdomen: Soft, Tender to palpate, Distended. Bowel sounds positive x4.  GU: Deferred. Musculoskeletal: No clubbing / cyanosis of digits/nails. No joint deformity upper and lower extremities.  Skin: No rashes, lesions, ulcers on a limited skin evaluation. No induration; Warm and dry.  Neurologic: CN 2-12 grossly intact with no focal deficits. Romberg sign and cerebellar reflexes not assessed.  Psychiatric: Normal judgment and insight. Alert and oriented x 3. Slightly anxious mood and appropriate affect.   Data Reviewed: I have personally reviewed following labs and imaging studies  CBC: Recent Labs  Lab 01/20/19 1114 01/21/19 0827 01/21/19 1817 01/22/19 0102  WBC 10.8* 22.2* 18.1* 20.5*  NEUTROABS 8.9* 18.2* 15.0* 16.7*  HGB 14.1 15.4 13.4 12.3*  HCT 44.4 49.0 43.1 38.1*  MCV 89.7 91.4 90.4 89.4  PLT 190 279 239 [id number]   Basic Metabolic Panel: Recent Labs  Lab 01/20/19 1114 01/21/19 0827 01/21/19 1817 01/22/19 0102  NA 134* 135 136 133*  K 4.3 3.7 4.1 3.6  CL 98 93* 95* 97*  CO2 22 25 24 24   GLUCOSE 258* 245* 265* 212*  BUN 14 18 27* 33*  CREATININE 0.92 1.18 2.25* 2.99*  CALCIUM 9.1 9.0 8.6* 7.8*  MG 1.8 1.8 1.7 1.7  PHOS  --  3.2 2.4* 3.1   GFR: Estimated Creatinine Clearance: 36.6 mL/min (A) (by C-G formula based on SCr of 2.99 mg/dL (H)). Liver Function Tests: Recent Labs  Lab 01/20/19 1114 01/21/19 0827 01/21/19 1817 01/22/19 0102  AST 24 36 449* 277*  ALT 24 26 298* 230*  ALKPHOS 58 63 116 102  BILITOT 0.8 1.5* 5.5* 5.3*  PROT 7.3 8.2* 7.1 6.6  ALBUMIN 3.6 3.9 3.4* 3.1*   Recent Labs  Lab 01/20/19 1114 01/21/19 1332  01/21/19 1900 01/22/19 0822  LIPASE 31 1,054*  --  219*  AMYLASE  --   --  509*  --    No results for input(s): AMMONIA in the last 168 hours. Coagulation Profile: Recent Labs  Lab 01/20/19 1114 01/21/19 2305  INR 1.0 1.4*   Cardiac Enzymes: Recent Labs  Lab 01/21/19 2305  CKTOTAL 115   BNP (last 3 results) No results for input(s): PROBNP in the last 8760 hours. HbA1C: Recent Labs    01/21/19 0144  HGBA1C 8.6*   CBG: Recent Labs  Lab 01/21/19 1132 01/21/19 1631 01/21/19 2206 01/22/19 0747 01/22/19 1147  GLUCAP 319* 248* 228* 179* 166*   Lipid Profile: No results for input(s): CHOL, HDL, LDLCALC, TRIG, CHOLHDL, LDLDIRECT in the last 72 hours. Thyroid Function Tests: Recent Labs    01/21/19 0144  TSH 2.396   Anemia Panel: No results for input(s): VITAMINB12, FOLATE, FERRITIN, TIBC, IRON, RETICCTPCT in the last 72 hours. Sepsis Labs: Recent Labs  Lab 01/21/19 2305 01/22/19 0102  LATICACIDVEN 2.1* 1.3    Recent Results (from the past 240 hour(s))  SARS CORONAVIRUS 2 (TAT 6-24 HRS) Nasopharyngeal Nasopharyngeal Swab     Status: None   Collection Time: 01/20/19  6:20 PM   Specimen: Nasopharyngeal Swab  Result Value Ref Range Status   SARS Coronavirus 2 NEGATIVE NEGATIVE Final    Comment: (NOTE) SARS-CoV-2 target nucleic acids are NOT DETECTED. The SARS-CoV-2 RNA is generally detectable in upper and lower respiratory specimens during the acute phase of infection. Negative results do not preclude SARS-CoV-2 infection, do not rule out co-infections with other pathogens, and should not be used as the sole basis for treatment or other patient management decisions. Negative results must be combined with clinical observations, patient history, and epidemiological information. The expected result is Negative. Fact Sheet for Patients: SugarRoll.be Fact Sheet for Healthcare  Providers: https://www.woods-mathews.com/ This test is not yet approved or cleared by the [place] FDA and  has been authorized for detection and/or diagnosis of SARS-CoV-2 by FDA under an Emergency Use Authorization (EUA). This EUA will remain  in effect (meaning this test can be used) for the duration of the COVID-19 declaration under Section 56 4(b)(1) of the Act, 21 U.S.C. section 360bbb-3(b)(1), unless the authorization is terminated or revoked sooner. Performed at [facility], [place] [address], [place], [place] [address]   Culture, blood (routine x 2)     Status: None (Preliminary result)   Collection Time: 01/21/19  1:32 PM   Specimen: BLOOD  Result Value Ref Range Status   Specimen Description   Final    BLOOD RIGHT ANTECUBITAL Performed at [facility], [place] [address] [place], [place] [address]    Special Requests   Final    BOTTLES DRAWN AEROBIC AND ANAEROBIC Blood Culture adequate volume Performed at [facility] [address], [place], [place] [address]    Culture   Final    NO GROWTH < 12 HOURS Performed at [facility] [address] [place], [place] [address]    Report Status PENDING  Incomplete  Culture, blood (routine x 2)  Status: None (Preliminary result)   Collection Time: 01/21/19  1:33 PM   Specimen: BLOOD  Result Value Ref Range Status   Specimen Description   Final    BLOOD RIGHT HAND Performed at [facility] [address], [place], [place] [address]    Special Requests   Final    BOTTLES DRAWN AEROBIC AND ANAEROBIC Blood Culture adequate volume Performed at [facility] [address], [place], [place] [address]    Culture   Final    NO GROWTH < 12 HOURS Performed at [facility] [address] [place], [place] [address]    Report Status PENDING  Incomplete  MRSA PCR Screening     Status: None   Collection Time: 01/21/19  7:28 PM    Specimen: Nasal Mucosa; Nasopharyngeal  Result Value Ref Range Status   MRSA by PCR NEGATIVE NEGATIVE Final    Comment:        The GeneXpert MRSA Assay (FDA approved for NASAL specimens only), is one component of a comprehensive MRSA colonization surveillance program. It is not intended to diagnose MRSA infection nor to guide or monitor treatment for MRSA infections. Performed at [facility], [place] [address], [place],  [address]       Radiology Studies: US Abdomen Complete  Result Date: 01/21/2019 CLINICAL DATA:  Right upper quadrant pain. EXAM: ABDOMEN ULTRASOUND COMPLETE COMPARISON:  CT abdomen pelvis 01/20/2019 FINDINGS: Extremely limited exam due to patient body habitus. Gallbladder: Poorly visualized. The wall may measure up to 0.5 cm in diameter insert spots. No definite calculi. Negative sonographic Murphy sign. The Common bile duct: Diameter: Not visualized Liver: Diffuse increased echogenicity of the liver. No definite focal lesion. The portal vein appears patent and in normal direction. IVC: No abnormality visualized. Pancreas: Not visualized. Spleen: Unremarkable.  Poorly visualized. Right Kidney: Length: 14.4 cm.  No definite hydronephrosis. Left Kidney: Length: 13.2 cm.  No definite hydronephrosis. Abdominal aorta: Not visualized. Other findings: None. IMPRESSION: 1. Extremely limited exam due to patient body habitus. Many structures were not visualized at all. 2. Diffusely increased echogenicity of the liver most commonly seen with hepatic steatosis. 3.  Possible gallbladder wall thickening but poorly assessed. Electronically Signed   By: Audie Pinto M.D.   On: 01/21/2019 14:58   Ct Abdomen Pelvis W Contrast  Result Date: 01/20/2019 CLINICAL DATA:  Abdominal pain EXAM: CT ABDOMEN AND PELVIS WITH CONTRAST TECHNIQUE: Multidetector CT imaging of the abdomen and pelvis was performed using the standard protocol following bolus administration of  intravenous contrast. CONTRAST:  134mL OMNIPAQUE IOHEXOL 300 MG/ML  SOLN COMPARISON:  None. FINDINGS: Lower chest: Subsegmental atelectasis. Hepatobiliary: Stable cyst of the left hepatic lobe. Gallbladder distention is unchanged. Pancreas: Unremarkable. No pancreatic ductal dilatation or surrounding inflammatory changes. Spleen: Unremarkable within limitation of streak artifact surgical clips. Adrenals/Urinary Tract: Adrenals are unremarkable. Small right renal cyst. Partially distended bladder is unremarkable. Stomach/Bowel: Postoperative changes of gastric bypass and reversal per medical record. Colonic diverticulosis without evidence of diverticulitis. Vascular/Lymphatic: No adenopathy. Reproductive: Unremarkable. Other: No ascites. Musculoskeletal: Postoperative changes of posterior lumbar spine fusion. Advanced multilevel degenerative changes. IMPRESSION: No findings to account for reported symptoms. Colonic diverticulosis without evidence of diverticulitis. Electronically Signed   By: Macy Mis M.D.   On: 01/20/2019 16:26   Dg Chest Port 1 View  Result Date: 01/22/2019 CLINICAL DATA:  Shortness of breath EXAM: PORTABLE CHEST 1 VIEW COMPARISON:  01/21/2019 FINDINGS: Bilateral lower lobe atelectasis. No  pleural effusion or pneumothorax. Cardiomegaly. Right IJ venous catheter terminates at the cavoatrial junction. IMPRESSION: Right IJ venous catheter terminates at the cavoatrial junction. Bilateral lower lobe atelectasis. Electronically Signed   By: Julian Hy M.D.   On: 01/22/2019 14:00   Dg Chest Port 1 View  Result Date: 01/21/2019 CLINICAL DATA:  Central line placement EXAM: PORTABLE CHEST 1 VIEW COMPARISON:  January 21, 2019 FINDINGS: There is a right-sided central venous catheter in place, the tip of which is difficult to fully assess secondary to overlapping osseous structures. There is no pneumothorax. There are worsening bibasilar airspace opacities. Slight interval widening of the  upper mediastinum is favored to be projectional. The heart size is enlarged. Extensive surgical clips are noted in the left upper quadrant. IMPRESSION: Right-sided central venous catheter as above with no evidence for right-sided pneumothorax. The tip of the catheter is difficult to evaluate secondary to overlapping osseous structures. Consider repeat radiograph as clinically indicated. Worsening bibasilar airspace disease. Electronically Signed   By: Constance Holster M.D.   On: 01/21/2019 22:45   Dg Chest Port 1 View  Result Date: 01/21/2019 CLINICAL DATA:  Hypertension, shortness of breath EXAM: PORTABLE CHEST 1 VIEW COMPARISON:  01/20/2019 FINDINGS: Mild right basilar atelectasis. Lungs are essentially clear. No pleural effusion or pneumothorax. Cysts Mild cardiomegaly. Cervical spine fixation hardware. IMPRESSION: Mild right basilar atelectasis. No evidence of acute cardiopulmonary disease. Electronically Signed   By: Julian Hy M.D.   On: 01/21/2019 19:23   Scheduled Meds:  Chlorhexidine Gluconate Cloth  6 each Topical Daily   DULoxetine  60 mg Oral Daily   enoxaparin (LOVENOX) injection  80 mg Subcutaneous Q24H   influenza vaccine adjuvanted  0.5 mL Intramuscular Tomorrow-1000   insulin aspart  0-5 Units Subcutaneous QHS   insulin aspart  0-9 Units Subcutaneous TID WC   insulin glargine  20 Units Subcutaneous Q2200   pantoprazole (PROTONIX) IV  40 mg Intravenous Q12H   simethicone  80 mg Oral QID   thiamine injection  100 mg Intravenous Daily   Continuous Infusions:  lactated ringers 75 mL/hr at 01/22/19 0752   norepinephrine (LEVOPHED) Adult infusion Stopped (01/22/19 0806)   piperacillin-tazobactam (ZOSYN)  IV 3.375 g (01/22/19 1352)    LOS: 2 days   The patient is critically ill with multiple organ system failure and requires high complexity decision making for assessment and support, frequent evaluation and titration of therapies, and application of advanced  monitoring technologies and extensive interpretation of multiple databases  CRITICAL CARE TIME: 43 Minutes of nonconsecutive minutes devoted to patient care services described in this note including but not limited to reviewing chart, seeing patient, coordinating care, updating family and discussing with consultants  Kerney Elbe, DO [facility] PAGER is on [place]  If 7PM-7AM, please contact night-coverage [email or web address] Password [email or web address] 01/22/2019, 2:20 PM

## 2019-01-23 ENCOUNTER — Inpatient Hospital Stay (HOSPITAL_COMMUNITY): Payer: Medicare Other

## 2019-01-23 ENCOUNTER — Encounter (HOSPITAL_COMMUNITY): Payer: Self-pay | Admitting: Nephrology

## 2019-01-23 ENCOUNTER — Other Ambulatory Visit (HOSPITAL_COMMUNITY): Payer: Medicare Other

## 2019-01-23 DIAGNOSIS — N179 Acute kidney failure, unspecified: Secondary | ICD-10-CM

## 2019-01-23 LAB — CBC WITH DIFFERENTIAL/PLATELET
Abs Immature Granulocytes: 0.07 10*3/uL (ref 0.00–0.07)
Basophils Absolute: 0 10*3/uL (ref 0.0–0.1)
Basophils Relative: 0 %
Eosinophils Absolute: 0.1 10*3/uL (ref 0.0–0.5)
Eosinophils Relative: 1 %
HCT: 36.2 % — ABNORMAL LOW (ref 39.0–52.0)
Hemoglobin: 11.2 g/dL — ABNORMAL LOW (ref 13.0–17.0)
Immature Granulocytes: 1 %
Lymphocytes Relative: 8 %
Lymphs Abs: 1.1 10*3/uL (ref 0.7–4.0)
MCH: 28.6 pg (ref 26.0–34.0)
MCHC: 30.9 g/dL (ref 30.0–36.0)
MCV: 92.3 fL (ref 80.0–100.0)
Monocytes Absolute: 1.1 10*3/uL — ABNORMAL HIGH (ref 0.1–1.0)
Monocytes Relative: 8 %
Neutro Abs: 11.8 10*3/uL — ABNORMAL HIGH (ref 1.7–7.7)
Neutrophils Relative %: 82 %
Platelets: 178 10*3/uL (ref 150–400)
RBC: 3.92 MIL/uL — ABNORMAL LOW (ref 4.22–5.81)
RDW: 17.2 % — ABNORMAL HIGH (ref 11.5–15.5)
WBC: 14.1 10*3/uL — ABNORMAL HIGH (ref 4.0–10.5)
nRBC: 0 % (ref 0.0–0.2)

## 2019-01-23 LAB — URINALYSIS, ROUTINE W REFLEX MICROSCOPIC: RBC / HPF: >50 RBC/hpf — ABNORMAL HIGH (ref 0–5)

## 2019-01-23 LAB — SODIUM, URINE, RANDOM: Sodium, Ur: 65 mmol/L

## 2019-01-23 LAB — GLUCOSE, CAPILLARY
Glucose-Capillary: 134 mg/dL — ABNORMAL HIGH (ref 70–99)
Glucose-Capillary: 146 mg/dL — ABNORMAL HIGH (ref 70–99)
Glucose-Capillary: 198 mg/dL — ABNORMAL HIGH (ref 70–99)
Glucose-Capillary: 201 mg/dL — ABNORMAL HIGH (ref 70–99)

## 2019-01-23 LAB — RAPID URINE DRUG SCREEN, HOSP PERFORMED
Amphetamines: NOT DETECTED
Barbiturates: NOT DETECTED
Benzodiazepines: NOT DETECTED
Cocaine: NOT DETECTED
Opiates: POSITIVE — AB
Tetrahydrocannabinol: NOT DETECTED

## 2019-01-23 LAB — URINE CULTURE: Culture: 40000 — AB

## 2019-01-23 LAB — COMPREHENSIVE METABOLIC PANEL
ALT: 130 U/L — ABNORMAL HIGH (ref 0–44)
AST: 102 U/L — ABNORMAL HIGH (ref 15–41)
Albumin: 2.6 g/dL — ABNORMAL LOW (ref 3.5–5.0)
Alkaline Phosphatase: 92 U/L (ref 38–126)
Anion gap: 11 (ref 5–15)
BUN: 52 mg/dL — ABNORMAL HIGH (ref 8–23)
CO2: 25 mmol/L (ref 22–32)
Calcium: 8 mg/dL — ABNORMAL LOW (ref 8.9–10.3)
Chloride: 99 mmol/L (ref 98–111)
Creatinine, Ser: 5.25 mg/dL — ABNORMAL HIGH (ref 0.61–1.24)
GFR calc Af Amer: 12 mL/min — ABNORMAL LOW (ref >60)
GFR calc non Af Amer: 10 mL/min — ABNORMAL LOW (ref >60)
Glucose, Bld: 152 mg/dL — ABNORMAL HIGH (ref 70–99)
Potassium: 4 mmol/L (ref 3.5–5.1)
Sodium: 135 mmol/L (ref 135–145)
Total Bilirubin: 2.3 mg/dL — ABNORMAL HIGH (ref 0.3–1.2)
Total Protein: 6.3 g/dL — ABNORMAL LOW (ref 6.5–8.1)

## 2019-01-23 LAB — LIPID PANEL
Cholesterol: 111 mg/dL (ref 0–200)
HDL: 29 mg/dL — ABNORMAL LOW (ref >40)
LDL Cholesterol: 55 mg/dL (ref 0–99)
Total CHOL/HDL Ratio: 3.8 RATIO
Triglycerides: 136 mg/dL (ref <150)
VLDL: 27 mg/dL (ref 0–40)

## 2019-01-23 LAB — PHOSPHORUS: Phosphorus: 3.7 mg/dL (ref 2.5–4.6)

## 2019-01-23 LAB — CREATININE, URINE, RANDOM: Creatinine, Urine: 114.02 mg/dL

## 2019-01-23 LAB — OSMOLALITY, URINE: Osmolality, Ur: 302 mOsm/kg (ref 300–900)

## 2019-01-23 LAB — LIPASE, BLOOD: Lipase: 79 U/L — ABNORMAL HIGH (ref 11–51)

## 2019-01-23 LAB — MAGNESIUM: Magnesium: 1.8 mg/dL (ref 1.7–2.4)

## 2019-01-23 MED ORDER — TRAZODONE HCL 150 MG PO TABS
300.0000 mg | ORAL_TABLET | Freq: Every day | ORAL | Status: DC
  Administered 2019-01-23 – 2019-02-09 (×18): 300 mg via ORAL
  Filled 2019-01-23 (×2): qty 6
  Filled 2019-01-23 (×6): qty 2
  Filled 2019-01-23: qty 6
  Filled 2019-01-23 (×4): qty 2
  Filled 2019-01-23 (×2): qty 6
  Filled 2019-01-23 (×2): qty 2
  Filled 2019-01-23: qty 6
  Filled 2019-01-23: qty 2
  Filled 2019-01-23 (×2): qty 6

## 2019-01-23 MED ORDER — SODIUM CHLORIDE 0.9 % IV BOLUS
1000.0000 mL | Freq: Once | INTRAVENOUS | Status: AC
  Administered 2019-01-23: 1000 mL via INTRAVENOUS

## 2019-01-23 MED ORDER — PIPERACILLIN-TAZOBACTAM IN DEX 2-0.25 GM/50ML IV SOLN
2.2500 g | Freq: Three times a day (TID) | INTRAVENOUS | Status: DC
  Filled 2019-01-23: qty 50

## 2019-01-23 MED ORDER — METRONIDAZOLE IN NACL 5-0.79 MG/ML-% IV SOLN
500.0000 mg | Freq: Three times a day (TID) | INTRAVENOUS | Status: DC
  Administered 2019-01-23 – 2019-01-31 (×23): 500 mg via INTRAVENOUS
  Filled 2019-01-23 (×23): qty 100

## 2019-01-23 MED ORDER — SODIUM CHLORIDE 0.9 % IV SOLN
2.0000 g | INTRAVENOUS | Status: DC
  Administered 2019-01-23 – 2019-01-30 (×8): 2 g via INTRAVENOUS
  Filled 2019-01-23 (×2): qty 20
  Filled 2019-01-23 (×3): qty 2
  Filled 2019-01-23 (×3): qty 20

## 2019-01-23 MED ORDER — TECHNETIUM TC 99M MEBROFENIN IV KIT
6.8000 | PACK | Freq: Once | INTRAVENOUS | Status: AC | PRN
  Administered 2019-01-23: 6.8 via INTRAVENOUS

## 2019-01-23 MED ORDER — HEPARIN SODIUM (PORCINE) 5000 UNIT/ML IJ SOLN
5000.0000 [IU] | Freq: Three times a day (TID) | INTRAMUSCULAR | Status: DC
  Administered 2019-01-23 – 2019-02-02 (×29): 5000 [IU] via SUBCUTANEOUS
  Filled 2019-01-23 (×29): qty 1

## 2019-01-23 MED ORDER — ENOXAPARIN SODIUM 40 MG/0.4ML ~~LOC~~ SOLN: 40.0000 mg | SUBCUTANEOUS | Status: DC

## 2019-01-23 NOTE — Progress Notes (Signed)
Progress Note: General Surgery Service   Chief Complaint/Subjective: Pain moderate, no nausea   Objective: Vital signs in last 24 hours: Temp:  [97.8 F (36.6 C)-99 F (37.2 C)] 97.8 F (36.6 C) (11/09 0823) Pulse Rate:  [92-108] 95 (11/09 0800) Resp:  [13-28] 20 (11/09 0800) BP: (104-155)/(45-136) 155/54 (11/09 0300) SpO2:  [88 %-94 %] 94 % (11/09 0800) Arterial Line BP: (106-136)/(60-82) 123/60 (11/09 0800) Weight:  [163.4 kg] 163.4 kg (11/08 1227) Last BM Date: 01/19/19  Intake/Output from previous day: 11/08 0701 - 11/09 0700 In: 1884.4 [I.V.:1778.8; IV Piggyback:105.6] Out: 150 [Urine:150] Intake/Output this shift: Total I/O In: 511.9 [I.V.:461.9; IV Piggyback:49.9] Out: 150 [Urine:150]  Gen: NAD  Resp: nonlabored  Card: RRR  Abd: epigastric tenderness  Lab Results: CBC  Recent Labs    01/22/19 0102 01/23/19 0340  WBC 20.5* 14.1*  HGB 12.3* 11.2*  HCT 38.1* 36.2*  PLT 239 178   BMET Recent Labs    01/22/19 0102 01/23/19 0340  NA 133* 135  K 3.6 4.0  CL 97* 99  CO2 24 25  GLUCOSE 212* 152*  BUN 33* 52*  CREATININE 2.99* 5.25*  CALCIUM 7.8* 8.0*   PT/INR Recent Labs    01/21/19 2305  LABPROT 16.8*  INR 1.4*   ABG Recent Labs    01/21/19 2250  PHART 7.452*  HCO3 27.3    Studies/Results:  Anti-infectives: Anti-infectives (From admission, onward)   Start     Dose/Rate Route Frequency Ordered Stop   01/23/19 1400  piperacillin-tazobactam (ZOSYN) IVPB 2.25 g  Status:  Discontinued     2.25 g 100 mL/hr over 30 Minutes Intravenous Every 8 hours 01/23/19 0717 01/23/19 0947   01/23/19 1400  cefTRIAXone (ROCEPHIN) 2 g in sodium chloride 0.9 % 100 mL IVPB     2 g 200 mL/hr over 30 Minutes Intravenous Every 24 hours 01/23/19 0947     01/23/19 1400  metroNIDAZOLE (FLAGYL) IVPB 500 mg     500 mg 100 mL/hr over 60 Minutes Intravenous Every 8 hours 01/23/19 0947     01/21/19 1400  piperacillin-tazobactam (ZOSYN) IVPB 3.375 g  Status:   Discontinued     3.375 g 12.5 mL/hr over 240 Minutes Intravenous Every 8 hours 01/21/19 1325 01/23/19 0717      Medications: Scheduled Meds: . Chlorhexidine Gluconate Cloth  6 each Topical Daily  . DULoxetine  60 mg Oral Daily  . heparin injection (subcutaneous)  5,000 Units Subcutaneous Q8H  . insulin aspart  0-5 Units Subcutaneous QHS  . insulin aspart  0-9 Units Subcutaneous TID WC  . insulin glargine  20 Units Subcutaneous Q2200  . mouth rinse  15 mL Mouth Rinse BID  . pantoprazole (PROTONIX) IV  40 mg Intravenous Q12H  . simethicone  80 mg Oral QID  . sodium chloride flush  10-40 mL Intracatheter Q12H  . thiamine injection  100 mg Intravenous Daily   Continuous Infusions: . cefTRIAXone (ROCEPHIN)  IV    . lactated ringers 100 mL/hr at 01/23/19 1033  . metronidazole    . norepinephrine (LEVOPHED) Adult infusion Stopped (01/22/19 0801)   PRN Meds:.bisacodyl, levalbuterol, morphine injection, ondansetron **OR** ondansetron (ZOFRAN) IV, polyethylene glycol, sodium chloride flush  Assessment/Plan:  Pancreatitis - + gallstones, quick elevation, first occurrence all point towards biliary nature. -will continue to follow clinically -would likely benefit from cholecystectomy once resolved given some signs of gallbladder inflammation, + stones, no other suspected causes of pancreatitis -Primary team getting HIDA today for concern of primary cholecystitis,  agree with Gerkin, if this needed immediate therapy during pancreatitis would lead to IR drain. Hopefully with decreasing WBC, this will not be the case  S/P open gastric bypass with multiple revisions, UGI in 2014 shows contrast moving through stomach. No dilation of proximal small bowel on CT to think this an outflow issue    LOS: 3 days   Mickeal Skinner, MD [facility]

## 2019-01-23 NOTE — Progress Notes (Signed)
[facility] Progress Note  Phillip Maldonado 68 y.o. 10-30-1950  CC: Abdominal pain, pancreatitis, abnormal LFTs   Subjective: Abdominal pain has improved compared to admission.  Denies nausea vomiting.  ROS : Complaining of generalized weakness.  Afebrile.   Objective: Vital signs in last 24 hours: Vitals:   01/23/19 0800 01/23/19 0823  BP:    Pulse: 95   Resp: 20   Temp:  97.8 F (36.6 C)  SpO2: 94%     Physical Exam: General.  Resting comfortably.  Not in acute distress morbidly obese Abdomen.  Mild epigastric and right upper quadrant tenderness to palpation, soft, obese abdomen, bowel sounds present Lower extremity.  Chronic edematous changes noted Psych.  Mood and affect normal Neuro.  Alert/oriented x3   Lab Results: Recent Labs    01/22/19 0102 01/23/19 0340  NA 133* 135  K 3.6 4.0  CL 97* 99  CO2 24 25  GLUCOSE 212* 152*  BUN 33* 52*  CREATININE 2.99* 5.25*  CALCIUM 7.8* 8.0*  MG 1.7 1.8  PHOS 3.1 3.7   Recent Labs    01/22/19 0102 01/23/19 0340  AST 277* 102*  ALT 230* 130*  ALKPHOS 102 92  BILITOT 5.3* 2.3*  PROT 6.6 6.3*  ALBUMIN 3.1* 2.6*   Recent Labs    01/22/19 0102 01/23/19 0340  WBC 20.5* 14.1*  NEUTROABS 16.7* 11.8*  HGB 12.3* 11.2*  HCT 38.1* 36.2*  MCV 89.4 92.3  PLT 239 178   Recent Labs    01/20/19 1114 01/21/19 2305  LABPROT 12.8 16.8*  INR 1.0 1.4*      Assessment/Plan: -Abdominal pain with pancreatitis.  MRI MRCP negative for biliary obstruction.  Showed mild reactive gallbladder wall thickening.  Patient does not drink alcohol.  Ultrasound negative for gallbladder stones but MRIs did showed small gallstones. -Abnormal LFTs.  Trending down. ??  Passed gallstone. 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 -Patient has been dismissed from [facility].  Currently followed by Dr. Carlean Purl    Recommendations -------------------------- -Hepatitis panel negative.  MRI MRCP negative for biliary obstruction but did showed small layering gallstones.  Possible biliary pancreatitis from passage of gallstone. -LFTs improving.  Check lipid profile -Follow HIDA scan -GI will follow     Otis Brace MD, FACP 01/23/2019, 9:10 AM  Contact #  [phone]

## 2019-01-23 NOTE — Progress Notes (Signed)
Pt had not voided since shift change so this RN bladder scanned the pt. The bladder scan said there was 0 mLs in his bladder. Then this morning his labs showed that his creatinine jumped up from  2.99 to 5.25. This RN notified the night coverage MD who put in orders to give the pt a 1000 liter fluid bolus of NS. This RN will continue to monitor the pts intake and output.

## 2019-01-23 NOTE — Consult Note (Addendum)
Renal Service Consult Note [facility]  Phillip Maldonado 01/23/2019 [name] Requesting Physician:  Dr Alfredia Ferguson  Reason for Consult:  Renal failure HPI: The patient is a 68 y.o. year-old with hx of HTN, hep B, hx etoh abuse, DM2 on insulin, depressoin, CHF, atrial fib who presented with chest and abd pain /  n/v on 01/20/19. Recently treated for GIB.  CT abd was neg. Pt got worse and went to ICU for septic shock.  Had acute liver injury and lipase ^. Came off pressors, was felt to have had possibly gallstone pancreatitis. Surgery has been following, and GI. Pt has AKI w/ rising creat 0.9 admit > 2.99 yest and 5.25 today.  UOP down < 500/d.  CPK wnl.  Asked to see for AKI.    BP's when pt went to ICU were 80's at the lowest.  BP's today 170 /80.  abd pain is better. They put foley in today, somewhat bloody urine. No hx of renal disease.   Acei on 11/7, then dc'd Torsemide po 11/6- 11.7 , then dc'd  +100 cc IV contrast on 11/6 w/ CT scan  CXR 11/9 - no active disease   ROS  denies CP  no joint pain   no HA  no blurry vision  no rash  no diarrhea     Past Medical History  Past Medical History:  Diagnosis Date  . Anemia   . Angina   . Anxiety   . Arthritis   . Atrial fibrillation with RVR ([place]) 01/24/2013  . Blood transfusion    last 10' 14- "GI bleed"  . CHF (congestive heart failure) ([place])   . CKD (chronic kidney disease) stage 3, GFR 30-59 ml/min 01/08/2016  . Depression   . Diabetes mellitus ([place]) 09/18/2011  . Diverticulosis   . DJD (degenerative joint disease)   . DM type 2, uncontrolled, with neuropathy ([place]) 12/26/2013  . Edema extremities    bilateral lower extremities-"weepy areas due to fluid retention"  . Fatty liver   . Fundic gland polyps of stomach, benign   . Headache(784.0)   . Hepatitis B   . History of alcohol abuse    last drink in 1993  . Hypertension   . Neuromuscular disorder ([place])   . 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 Obesity   . Obesity, morbid ([place]) 07/30/2015  . Pneumonia    not at present time  . Renal insufficiency   . Shortness of breath   . Sleep apnea, obstructive    does where bipap. 06-04-14 "doesn't use much now, no mask/tubing now.   Past Surgical History  Past Surgical History:  Procedure Laterality Date  . ABDOMINAL SURGERY     [phone]  . BACK SURGERY     x 8 -,multiple fusions(cervical to lumbar)  . COLONOSCOPY    . COLONOSCOPY WITH PROPOFOL N/A 06/14/2014   Procedure: COLONOSCOPY WITH PROPOFOL;  Surgeon: Gatha Mayer, MD;  Location: [facility] ENDOSCOPY;  Service: Endoscopy;  Laterality: N/A;  . DIAGNOSTIC LAPAROSCOPY    . ESOPHAGOGASTRODUODENOSCOPY N/A 01/03/2013   Procedure: ESOPHAGOGASTRODUODENOSCOPY (EGD);  Surgeon: Arta Silence, MD;  Location: [facility] ORS;  Service: Endoscopy;  Laterality: N/A;  . ESOPHAGOGASTRODUODENOSCOPY N/A 01/03/2013   Procedure: ESOPHAGOGASTRODUODENOSCOPY (EGD);  Surgeon: Arta Silence, MD;  Location: [facility];  Service: Endoscopy;  Laterality: N/A;  . ESOPHAGOGASTRODUODENOSCOPY N/A 01/23/2013   Procedure: ESOPHAGOGASTRODUODENOSCOPY (EGD);  Surgeon: Lear Ng, MD;  Location: [facility] ENDOSCOPY;  Service: Endoscopy;  Laterality: N/A;  .  ESOPHAGOGASTRODUODENOSCOPY Left 01/27/2013   Procedure: ESOPHAGOGASTRODUODENOSCOPY (EGD);  Surgeon: Lear Ng, MD;  Location: [facility];  Service: Endoscopy;  Laterality: Left;  . ESOPHAGOGASTRODUODENOSCOPY N/A 06/14/2014   Procedure: ESOPHAGOGASTRODUODENOSCOPY (EGD);  Surgeon: Gatha Mayer, MD;  Location: [facility];  Service: Endoscopy;  Laterality: N/A;  . ESOPHAGOSCOPY N/A 01/01/2013   Procedure: ESOPHAGOSCOPY;  Surgeon: Jeryl Columbia, MD;  Location: [facility];  Service: Endoscopy;  Laterality: N/A;  . GASTRIC BYPASS  1977   Reversed in 1992 and revision 1994  . HYDROCELE EXCISION  1996   x 2   . KNEE SURGERY Left 2004   Family History  Family History  Problem Relation Age of Onset  .  Heart disease Father   . Skin cancer Father   . COPD Father   . Hypertension Father   . Stroke Father   . Arthritis Mother   . Osteoporosis Mother   . Diabetes Daughter   . Depression Son   . Heart disease Maternal Uncle   . High blood pressure Sister   . High blood pressure Sister   . High blood pressure Son   . Depression Son   . Arthritis Sister   . Arthritis Sister   . Cancer Maternal Grandmother   . Heart disease Maternal Uncle   . Heart disease Paternal Uncle    Social History  reports that he has never smoked. He has never used smokeless tobacco. He reports that he does not drink alcohol or use drugs. Allergies  Allergies  Allergen Reactions  . Latex Other (See Comments) and Rash    Rash and itching Other reaction(s): Other (See Comments) Burning Feeling  . Piroxicam Other (See Comments) and Swelling    unknown Other reaction(s): Other (See Comments) unknown Tongue  . Chlorzoxazone     Other reaction(s): Angioedema (ALLERGY/intolerance)  . Adhesive [Tape] Other (See Comments)    Unknown paper tape ok to use    Home medications Prior to Admission medications   Medication Sig Start Date End Date Taking? Authorizing Provider  acetaminophen (TYLENOL) 650 MG CR tablet Take 2 tablets (1,300 mg total) by mouth 2 (two) times daily. 10/01/16  Yes [name], NP  amLODipine (NORVASC) 5 MG tablet Take 5 mg by mouth daily. 09/19/18  Yes [provider]  atorvastatin (LIPITOR) 40 MG tablet Take 1 tablet (40 mg total) by mouth daily. 02/22/17  Yes [name], NP  cholecalciferol (VITAMIN D3) 25 MCG (1000 UT) tablet Take 1,000 Units by mouth daily.   Yes [provider]  DULoxetine (CYMBALTA) 60 MG capsule TAKE 1 CAPSULE(60 MG) BY MOUTH TWICE DAILY Patient taking differently: Take 60 mg by mouth daily. TAKE 1 CAPSULE(60 MG) BY MOUTH TWICE DAILY 02/22/17  Yes [name], NP  Insulin Glargine (LANTUS SOLOSTAR) 100 UNIT/ML Solostar Pen inject  22-24 UNITS EVERY MORNING 02/22/17  Yes [name], NP  losartan (COZAAR) 100 MG tablet Take 100 mg by mouth daily. 11/03/18  Yes [provider]  metFORMIN (GLUCOPHAGE) 1000 MG tablet Take one tablet by mouth twice daily with a meal to control blood sugar 01/22/17  Yes [name], NP  metoprolol succinate (TOPROL-XL) 100 MG 24 hr tablet Take 100 mg by mouth daily. 09/22/18  Yes [provider]  Multiple Vitamin (MULITIVITAMIN WITH MINERALS) TABS Take 1 tablet by mouth daily.   Yes [provider]  pantoprazole (PROTONIX) 40 MG tablet TAKE 1 TABLET(40 MG) BY MOUTH TWICE DAILY Patient taking differently:  Take 40 mg by mouth 2 (two) times daily. TAKE 1 TABLET(40 MG) BY MOUTH TWICE DAILY 02/22/17  Yes [name], NP  polyethylene glycol (MIRALAX / GLYCOLAX) packet Take 17 g by mouth daily as needed for mild constipation or moderate constipation.   Yes [provider]  polyvinyl alcohol (LIQUIFILM TEARS) 1.4 % ophthalmic solution Place 1 drop into both eyes as needed for dry eyes.   Yes [provider]  pregabalin (LYRICA) 150 MG capsule Take 150 mg by mouth 3 (three) times daily. 09/22/18  Yes [provider]  torsemide (DEMADEX) 100 MG tablet Take 1 tablet (100 mg total) by mouth 2 (two) times daily. Patient taking differently: Take 50 mg by mouth daily.  11/03/16  Yes [name], NP  gabapentin (NEURONTIN) 300 MG capsule Take One to Two tablets by mouth three times daily as needed for pain. Patient not taking: Reported on 01/21/2019 02/22/17   [name], NP  glucose blood (ACCU-CHEK AVIVA PLUS) test strip 1 each by Other route See admin instructions. Check blood sugar twice daily    [provider]  lisinopril (PRINIVIL,ZESTRIL) 40 MG tablet TAKE 1 TABLET(40 MG) BY MOUTH DAILY Patient not taking: No sig reported 12/31/16   [name], NP  methadone (DOLOPHINE) 10 MG tablet Take 1 tablet (10 mg  total) by mouth every 8 (eight) hours as needed. Patient not taking: Reported on 01/21/2019 01/14/17   Hollace Kinnier L, DO  metolazone (ZAROXOLYN) 2.5 MG tablet One daily to help reduce edema Patient not taking: Reported on 01/21/2019 02/22/17   [name], NP  metoprolol tartrate (LOPRESSOR) 50 MG tablet TAKE 1 TABLET(50 MG) BY MOUTH TWICE DAILY Patient not taking: Reported on 01/21/2019 02/22/17   [name], NP  naloxegol oxalate (MOVANTIK) 25 MG TABS tablet Take 1 tablet (25 mg total) by mouth daily. Patient not taking: Reported on 01/21/2019 02/22/17   [name], NP  nystatin cream (MYCOSTATIN) Apply to yeast dermatitis in groin daily Patient not taking: Reported on 01/21/2019 01/20/17   [name], NP  traZODone (DESYREL) 100 MG tablet TAKE 3 TABLETS BY MOUTH AT BEDTIME FOR REST Patient not taking: Reported on 01/21/2019 02/22/17   [name], NP  triamcinolone cream (KENALOG) 0.1 % Apply sparingly to rash up to twice daily Patient not taking: Reported on 01/21/2019 01/20/17   [name], NP   Liver Function Tests Recent Labs  Lab 01/21/19 1817 01/22/19 0102 01/23/19 0340  AST 449* 277* 102*  ALT 298* 230* 130*  ALKPHOS 116 102 92  BILITOT 5.5* 5.3* 2.3*  PROT 7.1 6.6 6.3*  ALBUMIN 3.4* 3.1* 2.6*   Recent Labs  Lab 01/21/19 1332 01/21/19 1900 01/22/19 0822 01/23/19 0500  LIPASE 1,054*  --  219* 79*  AMYLASE  --  509*  --   --    CBC Recent Labs  Lab 01/21/19 1817 01/22/19 0102 01/23/19 0340  WBC 18.1* 20.5* 14.1*  NEUTROABS 15.0* 16.7* 11.8*  HGB 13.4 12.3* 11.2*  HCT 43.1 38.1* 36.2*  MCV 90.4 89.4 92.3  PLT 239 239 0000000   Basic Metabolic Panel Recent Labs  Lab 01/20/19 1114 01/21/19 0827 01/21/19 1817 01/22/19 0102 01/23/19 0340  NA 134* 135 136 133* 135  K 4.3 3.7 4.1 3.6 4.0  CL 98 93* 95* 97* 99  CO2 22 25 24 24 25   GLUCOSE 258* 245* 265* 212* 152*  BUN 14 18 27* 33* 52*  CREATININE  0.92 1.18 2.25* 2.99*  5.25*  CALCIUM 9.1 9.0 8.6* 7.8* 8.0*  PHOS  --  3.2 2.4* 3.1 3.7   Iron/TIBC/Ferritin/ %Sat    Component Value Date/Time   IRON 35 (L) 09/16/2013 0243   TIBC 397 09/16/2013 0243   FERRITIN 11 (L) 09/16/2013 0243   IRONPCTSAT 9 (L) 09/16/2013 0243    Vitals:   01/23/19 0600 01/23/19 0800 01/23/19 0823 01/23/19 1238  BP:      Pulse: (!) 102 95    Resp: 19 20    Temp:   97.8 F (36.6 C) 98.7 F (37.1 C)  TempSrc:   Oral Oral  SpO2: 94% 94%    Weight:      Height:        Exam Gen alert, on distress No rash, cyanosis or gangrene Sclera anicteric, throat clear  +JVD Chest clear bilat to bases, no rales or wheezing RRR no MRG Abd soft ntnd no mass or ascites +bs GU normal male w foley in place defe MS no joint effusions or deformity Ext mild bilat UE edema, no LE edema Neuro is alert, Ox 3 , nf       Home meds:   - metoprolol 50 bid/ metolazone 2.5 qd/ lisinopril 40 qd/ torsemide 100 bid/ amlodipine 10/ losartan 100/ amlodipine  - methadone 10 tid prn/ naloxegol oxalate 25 qd  - insulin glargine 20 hs/ ? Metformin   - gabapentin 300 tid prn/ duloxetine 60 bid/ trazodone 300 hs/ ? lyrica  - pantoprazole 40 qd/ atorvastatin 40     Assessment/ Plan: 1. Acute renal failure - combination of IV contrast/ hypotension-shock and ARB/ ACEi on board. Creat rising rapidly, no signs of rhabdo. Pt looks good, BP's are back to normal and no uremic signs.  Supportive care, dc IVF"s, get renal US, UA and lytes. Will follow.  2. Septic shock/ abd pain - suspected gallstone pancreatitis, much better 3. HTN -long hx 4. Chronic pain 5. Depression  6. Chronic CHF - no signs pulm edema  Yet 7. Vol excess- up 4-5 kg since admit, tolerating well at this time    Kelly Splinter  MD 01/23/2019, 3:53 PM

## 2019-01-23 NOTE — Progress Notes (Signed)
PROGRESS NOTE    Phillip Maldonado  [id number] DOB: [birth year] DOA: 01/20/2019 PCP: Patient, No Pcp Per   Brief Narrative:  HPI per Dr. Merton Border on 01/20/2019  Phillip Maldonado  is a 68 y.o. 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 fib, chronic abdominal pain presenting with acute onset of epigastric pain radiating to the right upper chest associated with nausea vomiting and no diarrhea.  The pain was radiating to the right chest and the patient had full work-up in the emergency room including an EKG, troponins CT of abdomen and pelvis with no significant abnormalities noted.  Patient denies any history of blood in his urine or stools.  Patient had subjective fevers early in the morning at 430 when he wake up with the abdominal pain.  Patient is on chronic pain medications but taking MiraLAX as well. Patient denies any cough or shortness of breath. Patient was recently treated for upper GI bleed and case discussed with Dr. Michail Sermon from GI. Work-up in the emergency room showed hyperglycemia otherwise most of his blood work is full and within the normal range when showed colonic diverticulosis  **Interim History Patient was still complaining of abdominal pain and WBC worsened.  CT was unrevealing but will obtain a right upper quadrant ultrasound as well as an abdominal ultrasound.  General surgery was consulted for further evaluation recommendations.  Throughout the day yesterday progressively started worsening and ended up going to the ICU and subsequently ended up in septic shock.  A central line was placed last night and was an arterial line in his given pressors.  Patient had acute liver injury as well as an acute kidney injury in the setting of his shock and because of his contrast.  Renal function has now worsened and nephrology was consulted for further evaluation.    LFTs started elevating and lipase  elevated to and likely suspected the patient had a gallstone pancreatitis secondary to obstructive pathologies is now trending down.  Was jaundiced a little bit yesterday morning but this is trending down.  Critical care is signed off the case now that he is off of pressors.  We will be obtaining a MRCP and have general surgery as well as gastroenterology weigh in further.  Patient is no longer requiring pressors at this time but blood pressure is on the lower side and will continue to monitor carefully.  He is now also having an acute kidney injury on CKD stage III and acute liver injury.  Patient attempted to go for HIDA scan was unsuccessful.  Foley catheter was placed and he had some hematuria.  Will monitor strict I's and O's and daily weight appreciate renal evaluation. Will watch Strict I's and O's and Daily Weights.  Assessment & Plan:   Principal Problem:   Acute pancreatitis Active Problems:   History of gastric bypass   History of marginal ulcers   DM type 2, uncontrolled, with neuropathy (HCC)   CKD (chronic kidney disease) stage 3, GFR 30-59 ml/min   Abdominal pain   Septic shock (HCC)   Encounter for central line placement   BMI 50.0-59.9, adult (HCC)   Elevated LFTs   Elevated amylase and lipase   Septic shock in the setting of right upper quadrant abdominal pain associated nausea vomiting and concern for acute cholecystitis and ? choledocholithiasis with obstructive jaundice and Gallstone pancreatitis -Decompensated yesterday and was transferred to the [facility] admission his WBC was 10.8 and  had worsened to 22.2 and then slowly improved to 18.1 but is now 20,500 -Lactic acid level has trended down is now 1.3 -Given 2-1/2 L of fluid boluses and will be continued on maintenance IV fluid and was increased to 145mL but has now been stopped by nephrology -Was made n.p.o. given his pancreatitis and his lipase level trended up to 1054 and is now down to 219 -Start empiric antibiotics  with IV Zosyn changed as below -CT of the Abdomen and Pelvis showed "No findings to account for reported symptoms. Colonic diverticulosis without evidence of diverticulitis."  However the report did mention a distended gallbladder -CT Abd and Pelvis reviewd from 09/28/2018 note showed Cholelithiasis but no evidence of Acute Cholecystitis -Right upper quadrant ultrasound showed "Extremely limited exam due to patient body habitus. Many structures were not visualized at all.  Diffusely increased echogenicity of the liver most commonly seen with hepatic steatosis.  Possible gallbladder wall thickening but poorly assessed." -I have asked general surgery to consult and they did not feel that was a surgical issue yesterday but now they feel that they could have possible acute cholecystitis and suspected a gallstone and a common bile duct -Dr. Harlow Asa recommends and if MRI demonstrates acute cholecystitis he may require percutaneous cholecystostomy tube given his leukocytosis and pancreatitis as this is not optimal timing for intervention at present -Patient was pancultured yesterday -IV fluids were started lactated Ringer's at 90 mL's per hour we will continue -Subsequently patient went into shock and had to have a central line placed and started on pressors with Levophed which has now been weaned off.  He was given 2-1/2 L of fluid boluses yesterday -Diet was made n.p.o. given his acute pancreatitis -Pain control with IV morphine 2 mg every 4 hours -Patient was still having abdominal pain on palpation in the right upper quadrant had positive Murphy sign on my examination yesterday -MRCP done and showed "Motion degraded images. Mild peripancreatic fluid along the pancreatic head/uncinate process, likely related to the patient's known acute pancreatitis. No drainable fluid collection/pseudocyst. Cholelithiasis. Mild gallbladder wall thickening, possibly reflecting secondary inflammatory changes given acute  pancreatitis. No intrahepatic or extrahepatic ductal dilatation. Common duct measures 4 mm. No choledocholithiasis is seen." -I suspect the patient may have had an obstructive biliary stone which is now passed given that trending down of his LFTs and T bili as well as a trending down of his lipase -Gastroenterology was consulted for further evaluation and Dr. Paulita Fujita recommends continuing supportive care with IV fluids and pressor support as needed -Continued with antibiotics with IV Zosyn and changed to IV ceftriaxone and Flagyl -LFTs are trending down MRCP done as above -Continue monitor temperature curve as well as daily CMP  Acute Respiratory Failure with Hypoxia in the setting of septic shock from likely above -Continuous pulse oximetry and maintain O2 saturation greater than 90% -Continue supplemental oxygen via nasal cannula and wean O2 as tolerated -Started Xopenex and Atrovent breathing treatments -Pulmonary has signed off the case -Chest x-ray showed "Right IJ line stable position. Stable cardiomegaly. Bibasilar atelectasis/infiltrates again noted without interim change." -Continue to monitor respiratory status carefully  Hyperbilirubinemia, Abnormal LFTs and Obstructive jaundice -As above and now improving and likely patient passed p.o. -Obtained a an abdominal ultrasound as above -Acute hepatitis panel was negative -Patient's AST went from 24 and trended all the way up to 449 and ALT trended up from 24 and went elevated to 98 -Now AST is 102 and trended down and now ALT  is now 130 -Continue to monitor and trend as patient's T bili went from 0.8 is now 5.5 is now trending down at 2.8 -MRCP as above and showed no choledocholithiasis -Continue monitor and trend hepatic function panel and repeat CMP in a.m.  Uncontrolled Diabetes Mellitus Type 2 complicated by Diabetic Neuropathy -Continue with gabapentin 300 mg p.o. 3 times daily as needed -Hold his Metformin 1000 g p.o. twice  daily -His hemoglobin A1c was 8.6 -CBGs have been ranging from 134-190;  -Continue with sensitive NovoLog sliding scale insulin before meals and at bedtime along with Lantus 20 units subcu daily at 10 PM -May need to consult diabetes education coordinator and will continue to monitor blood sugars carefully  Hypertensive Urgency, improved and now he became hypotensive in the setting of shock -Hypertensive urgency likely likely in the setting of abdominal pain but then became hypotensive in the setting of shock -Hold all antihypertensives at this time given his low blood pressure and he had just come off of pressors this morning  GERD and Hx of GI Bleed -Given famotidine 20 mg IV once yesterday with pantoprazole 40 mg IV every 12h  AKI on chronic Kidney Disease Stage 3, worsening -BUN/creatinine went from 14/0.92 is now significantly worsened to 52/5.25 in the setting of hypotension, as well as IV contrast diabetes mellitus antibiotics with IV Zosyn -Avoid nephrotoxic medications if possible, contrast dyes as well as hypotension -Patient's nephrotoxic medications have been held and antihypertensives have been held -Changed D5 NS to LR at 75 mL/hr increased to 100 mL/h; patient received 2-1/2 L fluid boluses since yesterday; IV fluid has now been received continue by nephrology -Placed a Foley catheter, check a.m. renal ultrasound and urine electrolytes -Nephrology consulted for further evaluation recommendations -Patient was anuric overnight so nephrology was consulted and Foley catheter was placed but now he has some hematuria as well -Repeat CMP in a.m.  Hematuria -Urinalysis and urine culture sent; in the setting of traumatic injury from Foley catheter insertion -Urinalysis was turbid and red in color and unable to be tested -Urinalysis showed rare bacteria, rare greater than 50 RBCs per high-power field and 11-20 WBCs -We will likely need to speak with urology  Chronic diastolic  CHF -Unknown Type as unable to view ECHO in our system -Appears fairly compensated and appears slightly dry now -Held lisinopril 40 mg p.o. daily, metoprolol 51 p.o. twice daily and torsemide 100 mg p.o. twice daily has been reduced down to 200 mg p.o. daily given his hypotension and shock -Strict I's and O's and daily weights; +4.302 liters -We will continue to monitor for signs and symptoms of volume overload as patient is getting IV fluid hydration with lactated Ringer's at 6 mL's per hour -Repeat echocardiogram showed an EF of 60 to 65% with grade 1 diastolic dysfunction -Currently does not have any signs of pulmonary edema -Repeat chest x-ray in a.m as the first 1 showed a right IJ stable position with stable cardiomegaly and bibasilar atelectasis and infiltrates noted without interim  HLD/Hypercholesterolemia -Held atorvastatin 40 mg p.o. nightly given his Hepatic dysfunction  History of Atrial Fibrillation -TSH was 2.396 -Now normal sinus rhythm with rate controlled -Continue to monitor on telemetry  History of Alcoholism and Gastric Bypass -IV Thiamine started at 100 mg q24h  History of Chronic Pain -Upon further review Patient no longer taking  Methadone 10 mg every 8 hours; -C/w as needed IV morphine 2 mg every 8 hours as needed and this was changed to 2  to 4 mg every 4 hours -Continue with bowel regimen as above and patient also takes Movantik at home which was held  Morbid Obesity -Estimated body mass index is 53.2 kg/m as calculated from the following:   Height as of this encounter: 5\' 9"  (1.753 m).   Weight as of this encounter: 163.4 kg. -Weight Loss and Dietary Counseling given   DVT prophylaxis: Enoxaparin 40 mg subcu every 24 Code Status: FULL CODE Family Communication: No family present at bedside  Disposition Plan: Pending further improvement and evaluation by general surgery, gastroenterology improved back to baseline   Consultants:   General  Surgery  Gastroenterology  Pulmonary  Nephrology  Procedures:  Central line placed and patient underwent MRCP  ECHOCARDIOGRAM IMPRESSIONS    1. Left ventricular ejection fraction, by visual estimation, is 60 to 65%. The left ventricle has normal function. There is no left ventricular hypertrophy.  2. Left ventricular diastolic parameters are consistent with Grade I diastolic dysfunction (impaired relaxation).  3. Global right ventricle has normal systolic function.The right ventricular size is normal. No increase in right ventricular wall thickness.  4. Left atrial size was mildly dilated.  5. Right atrial size was normal.  6. Moderate mitral annular calcification.  7. The mitral valve is normal in structure. No evidence of mitral valve regurgitation. No evidence of mitral stenosis.  8. The tricuspid valve is normal in structure. Tricuspid valve regurgitation is not demonstrated.  9. The aortic valve is tricuspid. Aortic valve regurgitation is not visualized. Mild aortic valve sclerosis without stenosis. 10. The pulmonic valve was normal in structure. Pulmonic valve regurgitation is not visualized. 11. The inferior vena cava is normal in size with greater than 50% respiratory variability, suggesting right atrial pressure of 3 mmHg. 12. The interatrial septum was not well visualized.  FINDINGS  Left Ventricle: Left ventricular ejection fraction, by visual estimation, is 60 to 65%. The left ventricle has normal function. There is no left ventricular hypertrophy. Left ventricular diastolic parameters are consistent with Grade I diastolic  dysfunction (impaired relaxation). Normal left atrial pressure.  Right Ventricle: The right ventricular size is normal. No increase in right ventricular wall thickness. Global RV systolic function is has normal systolic function.  Left Atrium: Left atrial size was mildly dilated.  Right Atrium: Right atrial size was normal in  size  Pericardium: There is no evidence of pericardial effusion.  Mitral Valve: The mitral valve is normal in structure. There is mild thickening of the mitral valve leaflet(s). There is mild calcification of the mitral valve leaflet(s). Moderate mitral annular calcification. No evidence of mitral valve stenosis by  observation. No evidence of mitral valve regurgitation.  Tricuspid Valve: The tricuspid valve is normal in structure. Tricuspid valve regurgitation is not demonstrated.  Aortic Valve: The aortic valve is tricuspid. Aortic valve regurgitation is not visualized. Mild aortic valve sclerosis is present, with no evidence of aortic valve stenosis.  Pulmonic Valve: The pulmonic valve was normal in structure. Pulmonic valve regurgitation is not visualized.  Aorta: The aortic root, ascending aorta and aortic arch are all structurally normal, with no evidence of dilitation or obstruction.  Venous: The inferior vena cava is normal in size with greater than 50% respiratory variability, suggesting right atrial pressure of 3 mmHg.  IAS/Shunts: The interatrial septum was not well visualized. No ventricular septal defect is seen or detected. There is no evidence of an atrial septal defect.     LEFT VENTRICLE PLAX 2D LVIDd:  5.70 cm  Diastology LVIDs:         3.30 cm  LV e' lateral:   5.87 cm/s LV PW:         1.00 cm  LV E/e' lateral: 13.1 LV IVS:        1.00 cm  LV e' medial:    5.22 cm/s LVOT diam:     2.10 cm  LV E/e' medial:  14.8 LV SV:         116 ml LV SV Index:   40.08 LVOT Area:     3.46 cm    RIGHT VENTRICLE RV S prime:     17.70 cm/s TAPSE (M-mode): 2.9 cm  LEFT ATRIUM             Index LA diam:        4.20 cm 1.59 cm/m LA Vol (A2C):   42.2 ml 16.01 ml/m LA Vol (A4C):   52.0 ml 19.72 ml/m LA Biplane Vol: 51.6 ml 19.57 ml/m  AORTIC VALVE LVOT Vmax:   157.00 cm/s LVOT Vmean:  102.000 cm/s LVOT VTI:    0.220 m   AORTA Ao Root diam: 3.50  cm  MITRAL VALVE MV Area (PHT): 3.46 cm              SHUNTS MV PHT:        63.51 msec            Systemic VTI:  0.22 m MV Decel Time: 219 msec              Systemic Diam: 2.10 cm MV E velocity: 77.10 cm/s  103 cm/s MV A velocity: 121.00 cm/s 70.3 cm/s MV E/A ratio:  0.64        1.5   Antimicrobials:  Anti-infectives (From admission, onward)   Start     Dose/Rate Route Frequency Ordered Stop   01/23/19 1400  piperacillin-tazobactam (ZOSYN) IVPB 2.25 g  Status:  Discontinued     2.25 g 100 mL/hr over 30 Minutes Intravenous Every 8 hours 01/23/19 0717 01/23/19 0947   01/23/19 1400  cefTRIAXone (ROCEPHIN) 2 g in sodium chloride 0.9 % 100 mL IVPB     2 g 200 mL/hr over 30 Minutes Intravenous Every 24 hours 01/23/19 0947     01/23/19 1400  metroNIDAZOLE (FLAGYL) IVPB 500 mg     500 mg 100 mL/hr over 60 Minutes Intravenous Every 8 hours 01/23/19 0947     01/21/19 1400  piperacillin-tazobactam (ZOSYN) IVPB 3.375 g  Status:  Discontinued     3.375 g 12.5 mL/hr over 240 Minutes Intravenous Every 8 hours 01/21/19 1325 01/23/19 0717     Subjective: Seen and examined at bedside and he was complaining of some abdominal pain.  Was little anxious.  No nausea or vomiting but ended up having some hematuria later on in the a.m. after Foley was placed.  Went for his HIDA scan was unsuccessful given his anxiety.  Had a little anuria overnight so Foley cath was placed and fluids were increased and nephrology was consulted.  Denies any other complaints or concerns at time.  Objective: Vitals:   01/23/19 1300 01/23/19 1400 01/23/19 1500 01/23/19 1600  BP: (!) 145/66 140/63 133/63 (!) 170/70  Pulse: 94 86 81 93  Resp: 20 18 (!) 23 20  Temp:    98.4 F (36.9 C)  TempSrc:    Oral  SpO2: 95% 96% 95% 96%  Weight:      Height:  Intake/Output Summary (Last 24 hours) at 01/23/2019 1837 Last data filed at 01/23/2019 1600 Gross per 24 hour  Intake 2715.84 ml  Output 325 ml  Net 2390.84 ml    Filed Weights   01/20/19 1023 01/21/19 1918 01/22/19 1227  Weight: (!) 152 kg (!) 161 kg (!) 163.4 kg   Examination: Physical Exam:  Constitutional: WN/WD morbidly obese Caucasian male in NAD and appears uncomfortable Eyes: Lids and conjunctivae normal, sclerae anicteric  ENMT: External Ears, Nose appear normal. Grossly normal hearing. Mucous membranes are moist. Neck: Appears normal, supple, no cervical masses, normal ROM, no appreciable thyromegaly; Has a Right IJ CVC Respiratory: Diminished to auscultation bilaterally, no wheezing, rales, rhonchi or crackles. Normal respiratory effort and patient is not tachypenic. No accessory muscle use. Wearing 4 Liters of Supplemental O2 via [place] Cardiovascular: Mildly Tachcyardic, no murmurs / rubs / gallops. S1 and S2 auscultated. 1+ extremity edema.  Abdomen: Soft, Tender to palpate, Distended. No masses palpated. No appreciable hepatosplenomegaly. Bowel sounds positive.  GU: Deferred. Musculoskeletal: No clubbing / cyanosis of digits/nails. No joint deformity upper and lower extremities.  Skin: No rashes, lesions, ulcers on a limited skin evaluation.. No induration; Warm and dry.  Neurologic: CN 2-12 grossly intact with no focal deficits. Romberg sign  and cerebellar reflexes not assessed.  Psychiatric: Normal judgment and insight. Alert and oriented x 3. Anxious mood and appropriate affect.    Data Reviewed: I have personally reviewed following labs and imaging studies  CBC: Recent Labs  Lab 01/20/19 1114 01/21/19 0827 01/21/19 1817 01/22/19 0102 01/23/19 0340  WBC 10.8* 22.2* 18.1* 20.5* 14.1*  NEUTROABS 8.9* 18.2* 15.0* 16.7* 11.8*  HGB 14.1 15.4 13.4 12.3* 11.2*  HCT 44.4 49.0 43.1 38.1* 36.2*  MCV 89.7 91.4 90.4 89.4 92.3  PLT 190 279 239 239 0000000   Basic Metabolic Panel: Recent Labs  Lab 01/20/19 1114 01/21/19 0827 01/21/19 1817 01/22/19 0102 01/23/19 0340  NA 134* 135 136 133* 135  K 4.3 3.7 4.1 3.6 4.0  CL 98 93*  95* 97* 99  CO2 22 25 24 24 25   GLUCOSE 258* 245* 265* 212* 152*  BUN 14 18 27* 33* 52*  CREATININE 0.92 1.18 2.25* 2.99* 5.25*  CALCIUM 9.1 9.0 8.6* 7.8* 8.0*  MG 1.8 1.8 1.7 1.7 1.8  PHOS  --  3.2 2.4* 3.1 3.7   GFR: Estimated Creatinine Clearance: 20.8 mL/min (A) (by C-G formula based on SCr of 5.25 mg/dL (H)). Liver Function Tests: Recent Labs  Lab 01/20/19 1114 01/21/19 0827 01/21/19 1817 01/22/19 0102 01/23/19 0340  AST 24 36 449* 277* 102*  ALT 24 26 298* 230* 130*  ALKPHOS 58 63 116 102 92  BILITOT 0.8 1.5* 5.5* 5.3* 2.3*  PROT 7.3 8.2* 7.1 6.6 6.3*  ALBUMIN 3.6 3.9 3.4* 3.1* 2.6*   Recent Labs  Lab 01/20/19 1114 01/21/19 1332 01/21/19 1900 01/22/19 0822 01/23/19 0500  LIPASE 31 1,054*  --  219* 79*  AMYLASE  --   --  509*  --   --    No results for input(s): AMMONIA in the last 168 hours. Coagulation Profile: Recent Labs  Lab 01/20/19 1114 01/21/19 2305  INR 1.0 1.4*   Cardiac Enzymes: Recent Labs  Lab 01/21/19 2305  CKTOTAL 115   BNP (last 3 results) No results for input(s): PROBNP in the last 8760 hours. HbA1C: Recent Labs    01/21/19 0144  HGBA1C 8.6*   CBG: Recent Labs  Lab 01/22/19 1732 01/22/19 2120 01/23/19 0757  01/23/19 1208 01/23/19 1647  GLUCAP 173* 154* 134* 146* 198*   Lipid Profile: Recent Labs    01/23/19 0340  CHOL 111  HDL 29*  LDLCALC 55  TRIG 136  CHOLHDL 3.8   Thyroid Function Tests: Recent Labs    01/21/19 0144  TSH 2.396   Anemia Panel: No results for input(s): VITAMINB12, FOLATE, FERRITIN, TIBC, IRON, RETICCTPCT in the last 72 hours. Sepsis Labs: Recent Labs  Lab 01/21/19 2305 01/22/19 0102  LATICACIDVEN 2.1* 1.3    Recent Results (from the past 240 hour(s))  SARS CORONAVIRUS 2 (TAT 6-24 HRS) Nasopharyngeal Nasopharyngeal Swab     Status: None   Collection Time: 01/20/19  6:20 PM   Specimen: Nasopharyngeal Swab  Result Value Ref Range Status   SARS Coronavirus 2 NEGATIVE NEGATIVE Final     Comment: (NOTE) SARS-CoV-2 target nucleic acids are NOT DETECTED. The SARS-CoV-2 RNA is generally detectable in upper and lower respiratory specimens during the acute phase of infection. Negative results do not preclude SARS-CoV-2 infection, do not rule out co-infections with other pathogens, and should not be used as the sole basis for treatment or other patient management decisions. Negative results must be combined with clinical observations, patient history, and epidemiological information. The expected result is Negative. Fact Sheet for Patients: SugarRoll.be Fact Sheet for Healthcare Providers: https://www.woods-mathews.com/ This test is not yet approved or cleared by the [place] FDA and  has been authorized for detection and/or diagnosis of SARS-CoV-2 by FDA under an Emergency Use Authorization (EUA). This EUA will remain  in effect (meaning this test can be used) for the duration of the COVID-19 declaration under Section 56 4(b)(1) of the Act, 21 U.S.C. section 360bbb-3(b)(1), unless the authorization is terminated or revoked sooner. Performed at [facility], [place] [address], [place], [place] [address]   Culture, Urine     Status: Abnormal   Collection Time: 01/21/19  1:03 PM   Specimen: Urine, Random  Result Value Ref Range Status   Specimen Description   Final    URINE, RANDOM Performed at [facility] [address], [place], [place] [address]    Special Requests   Final    NONE Performed at [facility], [place] [address], [place], [place] [address]    Culture (A)  Final    40,000 COLONIES/mL MULTIPLE SPECIES PRESENT, SUGGEST RECOLLECTION   Report Status 01/23/2019 FINAL  Final  Culture, blood (routine x 2)     Status: None (Preliminary result)   Collection Time: 01/21/19  1:32 PM   Specimen: BLOOD  Result Value Ref Range Status   Specimen Description   Final    BLOOD  RIGHT ANTECUBITAL Performed at [facility], [place] [address] [place], [place] [address]    Special Requests   Final    BOTTLES DRAWN AEROBIC AND ANAEROBIC Blood Culture adequate volume Performed at [facility] [address], [place], [place] [address]    Culture   Final    NO GROWTH 2 DAYS Performed at [facility] [address], [place], [place] [address]    Report Status PENDING  Incomplete  Culture, blood (routine x 2)     Status: None (Preliminary result)   Collection Time: 01/21/19  1:33 PM   Specimen: BLOOD  Result Value Ref Range Status   Specimen Description   Final    BLOOD RIGHT HAND Performed at [facility] [address] [place],  [place] [address]    Special Requests   Final    BOTTLES DRAWN AEROBIC AND ANAEROBIC Blood Culture adequate volume Performed at [facility] [address] [place], [place] [address]    Culture   Final    NO GROWTH 2 DAYS Performed at [facility] [address], [place], [place] [address]    Report Status PENDING  Incomplete  MRSA PCR Screening     Status: None   Collection Time: 01/21/19  7:28 PM   Specimen: Nasal Mucosa; Nasopharyngeal  Result Value Ref Range Status   MRSA by PCR NEGATIVE NEGATIVE Final    Comment:        The GeneXpert MRSA Assay (FDA approved for NASAL specimens only), is one component of a comprehensive MRSA colonization surveillance program. It is not intended to diagnose MRSA infection nor to guide or monitor treatment for MRSA infections. Performed at [facility], [place] [address], [place], [place] [address]       Radiology Studies: US Renal  Result Date: 01/23/2019 CLINICAL DATA:  Acute kidney injury EXAM: RENAL / URINARY TRACT ULTRASOUND COMPLETE COMPARISON:  Ultrasound abdomen 01/21/2019 FINDINGS: Right Kidney: Renal measurements: 14.3 x 7.8 x 7.6 cm = volume: 439 mL. Negative for obstruction or  mass. Limited visualization due to body habitus. Left Kidney: Renal measurements: 12.5 x 7.9 x 5.5 cm = volume: 285 mL. Negative for obstruction or mass. Limited evaluation left kidney due to body habitus. Bladder: Foley catheter.  Urinary bladder empty. Other: None. IMPRESSION: Limited visualization of the kidneys due to body habitus. No obstruction. Normal renal size. Urinary bladder empty with Foley catheter. Electronically Signed   By: Franchot Gallo M.D.   On: 01/23/2019 16:26   Mr Abdomen Mrcp Wo Contrast  Result Date: 01/22/2019 CLINICAL DATA:  Acute pancreatitis, abdominal pain, jaundice, cholelithiasis, abnormal LFTs EXAM: MRI ABDOMEN WITHOUT CONTRAST  (INCLUDING MRCP) TECHNIQUE: Multiplanar multisequence MR imaging of the abdomen was performed. Heavily T2-weighted images of the biliary and pancreatic ducts were obtained, and three-dimensional MRCP images were rendered by post processing. COMPARISON:  Abdominal process dated 01/21/2019. CT abdomen/pelvis dated 01/20/2019. FINDINGS: Motion degraded images. Lower chest: Mild bilateral lower lobe atelectasis. Hepatobiliary: 3.4 cm bilobed cyst in the left hepatic lobe. Mild hepatic steatosis. Layering small gallstones (series 5/image 28). Mild gallbladder wall thickening. No intrahepatic or extrahepatic ductal dilatation. Common duct measures 4 mm (series 4/image 29 and 30). No choledocholithiasis is seen. Pancreas: Mild peripancreatic fluid along the pancreatic head/uncinate process, related to the patient's known acute pancreatitis. No drainable fluid collection/pseudocyst. Spleen:  Within normal limits. Adrenals/Urinary Tract:  Adrenal glands are within normal limits. Small bilateral renal cysts, measuring up to 1.6 cm in the right lower kidney (series 12/image 4). Left kidney is within normal limits. No hydronephrosis. Stomach/Bowel: Stomach and visualized bowel are grossly unremarkable. Vascular/Lymphatic:  No evidence of abdominal aortic aneurysm. No  suspicious abdominal lymphadenopathy. Other:  No abdominal ascites. Musculoskeletal: Degenerative changes of the visualized thoracolumbar spine. Status post PLIF at L1-4. IMPRESSION: Motion degraded images. Mild peripancreatic fluid along the pancreatic head/uncinate process, likely related to the patient's known acute pancreatitis. No drainable fluid collection/pseudocyst. Cholelithiasis. Mild gallbladder wall thickening, possibly reflecting secondary inflammatory changes given acute pancreatitis. No intrahepatic or extrahepatic ductal dilatation. Common duct measures 4 mm. No choledocholithiasis is seen. Additional ancillary findings as above. Electronically Signed   By: Julian Hy M.D.   On: 01/22/2019 16:47   Dg Chest [facility]  Result Date: 01/23/2019 CLINICAL DATA:  Shortness of breath. EXAM: PORTABLE CHEST 1 VIEW COMPARISON:  01/22/2019. FINDINGS: Right IJ line noted with tip in stable position. Cardiomegaly. Bibasilar atelectasis/infiltrates again noted without interim change. No prominent pleural effusion. No pneumothorax. Prior cervical spine fusion. IMPRESSION: 1.  Right IJ line stable position. 2.  Stable cardiomegaly. 3. Bibasilar atelectasis/infiltrates again noted without interim change. Electronically Signed   By: Marcello Moores  Register   On: 01/23/2019 06:55   Dg Chest Port 1 View  Result Date: 01/22/2019 CLINICAL DATA:  Shortness of breath EXAM: PORTABLE CHEST 1 VIEW COMPARISON:  01/21/2019 FINDINGS: Bilateral lower lobe atelectasis. No pleural effusion or pneumothorax. Cardiomegaly. Right IJ venous catheter terminates at the cavoatrial junction. IMPRESSION: Right IJ venous catheter terminates at the cavoatrial junction. Bilateral lower lobe atelectasis. Electronically Signed   By: Julian Hy M.D.   On: 01/22/2019 14:00   Dg Chest Port 1 View  Result Date: 01/21/2019 CLINICAL DATA:  Central line placement EXAM: PORTABLE CHEST 1 VIEW COMPARISON:  January 21, 2019 FINDINGS:  There is a right-sided central venous catheter in place, the tip of which is difficult to fully assess secondary to overlapping osseous structures. There is no pneumothorax. There are worsening bibasilar airspace opacities. Slight interval widening of the upper mediastinum is favored to be projectional. The heart size is enlarged. Extensive surgical clips are noted in the left upper quadrant. IMPRESSION: Right-sided central venous catheter as above with no evidence for right-sided pneumothorax. The tip of the catheter is difficult to evaluate secondary to overlapping osseous structures. Consider repeat radiograph as clinically indicated. Worsening bibasilar airspace disease. Electronically Signed   By: Constance Holster M.D.   On: 01/21/2019 22:45   Dg Chest Port 1 View  Result Date: 01/21/2019 CLINICAL DATA:  Hypertension, shortness of breath EXAM: PORTABLE CHEST 1 VIEW COMPARISON:  01/20/2019 FINDINGS: Mild right basilar atelectasis. Lungs are essentially clear. No pleural effusion or pneumothorax. Cysts Mild cardiomegaly. Cervical spine fixation hardware. IMPRESSION: Mild right basilar atelectasis. No evidence of acute cardiopulmonary disease. Electronically Signed   By: Julian Hy M.D.   On: 01/21/2019 19:23   Scheduled Meds:  Chlorhexidine Gluconate Cloth  6 each Topical Daily   DULoxetine  60 mg Oral Daily   heparin injection (subcutaneous)  5,000 Units Subcutaneous Q8H   insulin aspart  0-5 Units Subcutaneous QHS   insulin aspart  0-9 Units Subcutaneous TID WC   insulin glargine  20 Units Subcutaneous Q2200   mouth rinse  15 mL Mouth Rinse BID   pantoprazole (PROTONIX) IV  40 mg Intravenous Q12H   simethicone  80 mg Oral QID   sodium chloride flush  10-40 mL Intracatheter Q12H   thiamine injection  100 mg Intravenous Daily   Continuous Infusions:  cefTRIAXone (ROCEPHIN)  IV Stopped (01/23/19 1352)   metronidazole Stopped (01/23/19 1459)   norepinephrine (LEVOPHED)  Adult infusion Stopped (01/22/19 0801)    LOS: 3 days   The patient is critically ill with multiple organ system failure and requires high complexity decision making for assessment and support, frequent evaluation and titration of therapies, and application of advanced monitoring technologies and extensive interpretation of multiple databases  CRITICAL CARE TIME: 32 Minutes of nonconsecutive minutes devoted to patient care services described in this note including but not limited to reviewing chart, seeing patient, coordinating care, updating family and discussing with consultants  Kerney Elbe, DO [facility] PAGER is on AMION  If 7PM-7AM, please contact night-coverage [email or web address] Password [facility] 01/23/2019, 6:37  PM

## 2019-01-23 NOTE — Progress Notes (Signed)
PHARMACY NOTE:  ANTIMICROBIAL RENAL DOSAGE ADJUSTMENT  Current antimicrobial regimen includes a mismatch between antimicrobial dosage and estimated renal function.  As per policy approved by the Pharmacy & Therapeutics and Medical Executive Committees, the antimicrobial dosage will be adjusted accordingly.  Current antimicrobial dosage:  Zosyn 3.375 gm IV q8h (infuse over 4 hrs)  Indication: intra-abdominal infection  Renal Function:  Estimated Creatinine Clearance: 20.8 mL/min (A) (by C-G formula based on SCr of 5.25 mg/dL (H)). Low urine output []      On intermittent HD, scheduled: []      On CRRT    Antimicrobial dosage has been changed to:  Zosyn 2.25 gm IV q8h over 30 min   Thank you for allowing pharmacy to be a part of this patient's care.  Lynelle Doctor, [facility] 01/23/2019 7:14 AM

## 2019-01-24 ENCOUNTER — Inpatient Hospital Stay (HOSPITAL_COMMUNITY): Payer: Medicare Other

## 2019-01-24 DIAGNOSIS — R7881 Bacteremia: Secondary | ICD-10-CM

## 2019-01-24 LAB — GLUCOSE, CAPILLARY
Glucose-Capillary: 135 mg/dL — ABNORMAL HIGH (ref 70–99)
Glucose-Capillary: 156 mg/dL — ABNORMAL HIGH (ref 70–99)
Glucose-Capillary: 213 mg/dL — ABNORMAL HIGH (ref 70–99)
Glucose-Capillary: 199 mg/dL — ABNORMAL HIGH (ref 70–99)

## 2019-01-24 LAB — CBC WITH DIFFERENTIAL/PLATELET
Abs Immature Granulocytes: 0.05 10*3/uL (ref 0.00–0.07)
Basophils Absolute: 0 10*3/uL (ref 0.0–0.1)
Basophils Relative: 0 %
Eosinophils Absolute: 0.2 10*3/uL (ref 0.0–0.5)
Eosinophils Relative: 2 %
HCT: 34.8 % — ABNORMAL LOW (ref 39.0–52.0)
Hemoglobin: 10.9 g/dL — ABNORMAL LOW (ref 13.0–17.0)
Immature Granulocytes: 0 %
Lymphocytes Relative: 11 %
Lymphs Abs: 1.2 10*3/uL (ref 0.7–4.0)
MCH: 28.3 pg (ref 26.0–34.0)
MCHC: 31.3 g/dL (ref 30.0–36.0)
MCV: 90.4 fL (ref 80.0–100.0)
Monocytes Absolute: 1 10*3/uL (ref 0.1–1.0)
Monocytes Relative: 8 %
Neutro Abs: 9.1 10*3/uL — ABNORMAL HIGH (ref 1.7–7.7)
Neutrophils Relative %: 79 %
Platelets: 197 10*3/uL (ref 150–400)
RBC: 3.85 MIL/uL — ABNORMAL LOW (ref 4.22–5.81)
RDW: 16.6 % — ABNORMAL HIGH (ref 11.5–15.5)
WBC: 11.6 10*3/uL — ABNORMAL HIGH (ref 4.0–10.5)
nRBC: 0 % (ref 0.0–0.2)

## 2019-01-24 LAB — BLOOD CULTURE ID PANEL (REFLEXED)

## 2019-01-24 LAB — PHOSPHORUS: Phosphorus: 4 mg/dL (ref 2.5–4.6)

## 2019-01-24 LAB — COMPREHENSIVE METABOLIC PANEL
ALT: 87 U/L — ABNORMAL HIGH (ref 0–44)
AST: 54 U/L — ABNORMAL HIGH (ref 15–41)
Albumin: 2.4 g/dL — ABNORMAL LOW (ref 3.5–5.0)
Alkaline Phosphatase: 107 U/L (ref 38–126)
Anion gap: 15 (ref 5–15)
BUN: 63 mg/dL — ABNORMAL HIGH (ref 8–23)
CO2: 22 mmol/L (ref 22–32)
Calcium: 8.1 mg/dL — ABNORMAL LOW (ref 8.9–10.3)
Chloride: 97 mmol/L — ABNORMAL LOW (ref 98–111)
Creatinine, Ser: 6.69 mg/dL — ABNORMAL HIGH (ref 0.61–1.24)
GFR calc Af Amer: 9 mL/min — ABNORMAL LOW (ref >60)
GFR calc non Af Amer: 8 mL/min — ABNORMAL LOW (ref >60)
Glucose, Bld: 161 mg/dL — ABNORMAL HIGH (ref 70–99)
Potassium: 4 mmol/L (ref 3.5–5.1)
Sodium: 134 mmol/L — ABNORMAL LOW (ref 135–145)
Total Bilirubin: 1.3 mg/dL — ABNORMAL HIGH (ref 0.3–1.2)
Total Protein: 6.4 g/dL — ABNORMAL LOW (ref 6.5–8.1)

## 2019-01-24 LAB — MAGNESIUM: Magnesium: 1.9 mg/dL (ref 1.7–2.4)

## 2019-01-24 LAB — LIPASE, BLOOD: Lipase: 38 U/L (ref 11–51)

## 2019-01-24 LAB — URINE CULTURE: Culture: NO GROWTH

## 2019-01-24 MED ORDER — MORPHINE SULFATE (PF) 4 MG/ML IV SOLN
4.0000 mg | INTRAVENOUS | Status: DC | PRN
  Administered 2019-01-25 – 2019-01-26 (×5): 4 mg via INTRAVENOUS
  Filled 2019-01-24 (×4): qty 1

## 2019-01-24 MED ORDER — SUCRALFATE 1 GM/10ML PO SUSP
1.0000 g | Freq: Once | ORAL | Status: AC
  Administered 2019-01-24: 1 g via ORAL
  Filled 2019-01-24: qty 10

## 2019-01-24 NOTE — Progress Notes (Signed)
[facility] Progress Note  Phillip Maldonado 68 y.o. November 02, 1950  CC: Abdominal pain, pancreatitis, abnormal LFTs   Subjective: Sitting comfortably in the chair.  Abdominal pain continues to improve.  Patient refused HIDA scan yesterday after going down for testing because of abdominal discomfort and around 2 hours of testing requirement.  ROS : Complaining of generalized weakness.  Afebrile.   Objective: Vital signs in last 24 hours: Vitals:   01/24/19 0800 01/24/19 0900  BP: (!) 140/57 (!) 155/82  Pulse: 80 85  Resp:    Temp: 98 F (36.7 C)   SpO2: 95% 95%    Physical Exam: General.  Resting comfortably.  Not in acute distress morbidly obese Abdomen.  Mild epigastric and right upper quadrant tenderness to palpation, soft, obese abdomen, bowel sounds present Lower extremity.  Chronic edematous changes noted Psych.  Mood and affect normal Neuro.  Alert/oriented x3   Lab Results: Recent Labs    01/23/19 0340 01/24/19 0150  NA 135 134*  K 4.0 4.0  CL 99 97*  CO2 25 22  GLUCOSE 152* 161*  BUN 52* 63*  CREATININE 5.25* 6.69*  CALCIUM 8.0* 8.1*  MG 1.8 1.9  PHOS 3.7 4.0   Recent Labs    01/23/19 0340 01/24/19 0150  AST 102* 54*  ALT 130* 87*  ALKPHOS 92 107  BILITOT 2.3* 1.3*  PROT 6.3* 6.4*  ALBUMIN 2.6* 2.4*   Recent Labs    01/23/19 0340 01/24/19 0150  WBC 14.1* 11.6*  NEUTROABS 11.8* 9.1*  HGB 11.2* 10.9*  HCT 36.2* 34.8*  MCV 92.3 90.4  PLT 178 197   Recent Labs    01/21/19 2305  LABPROT 16.8*  INR 1.4*      Assessment/Plan: -Abdominal pain with pancreatitis.  MRI MRCP negative for biliary obstruction.  Showed mild reactive gallbladder wall thickening.  Patient does not drink alcohol.  Ultrasound negative for gallbladder stones but MRIs did showed small gallstones. -Abnormal LFTs.  Trending down. ??  Passed gallstone. 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 -Patient has been dismissed from [facility].  Currently followed by Dr. Carlean Purl   Recommendations -------------------------- -Hepatitis panel negative.  MRI MRCP negative for biliary obstruction but did showed small layering gallstones.  Possible biliary pancreatitis from passage of gallstone. -LFTs improving.  Lipid panel normal. -Patient was not able to complete HIDA scan. -No further inpatient GI work-up planned.   -Slowly advance diet as tolerated.  Follow-up with Dr. Arelia Longest in 2 to 4 weeks after discharge. -GI will sign off.  Call us back if needed     Otis Brace MD, [place] 01/24/2019, 10:26 AM  Contact #  [phone]

## 2019-01-24 NOTE — Progress Notes (Signed)
[facility] Progress Note  Subjective: no new c/o, UOP 525 yesterday , creat up 6.5  Vitals:   01/24/19 1200 01/24/19 1300 01/24/19 1400 01/24/19 1700  BP: 140/81 (!) 152/78 (!) 193/89   Pulse: 82 86 88   Resp: (!) 23 18 20    Temp: 97.8 F (36.6 C)   98.2 F (36.8 C)  TempSrc: Oral   Oral  SpO2: 91% 94% 94%   Weight:      Height:        Inpatient medications: . Chlorhexidine Gluconate Cloth  6 each Topical Daily  . DULoxetine  60 mg Oral Daily  . heparin injection (subcutaneous)  5,000 Units Subcutaneous Q8H  . insulin aspart  0-5 Units Subcutaneous QHS  . insulin aspart  0-9 Units Subcutaneous TID WC  . insulin glargine  20 Units Subcutaneous Q2200  . mouth rinse  15 mL Mouth Rinse BID  . pantoprazole (PROTONIX) IV  40 mg Intravenous Q12H  . simethicone  80 mg Oral QID  . sodium chloride flush  10-40 mL Intracatheter Q12H  . thiamine injection  100 mg Intravenous Daily  . traZODone  300 mg Oral QHS   . cefTRIAXone (ROCEPHIN)  IV Stopped (01/24/19 1349)  . metronidazole Stopped (01/24/19 1456)   bisacodyl, levalbuterol, morphine injection, ondansetron **OR** ondansetron (ZOFRAN) IV, polyethylene glycol, sodium chloride flush    Exam: Gen alert, on distress No rash, cyanosis or gangrene Sclera anicteric, throat clear  +JVD Chest clear bilat to bases, no rales or wheezing RRR no MRG Abd soft ntnd no mass or ascites +bs GU normal male w foley in place defe MS no joint effusions or deformity Ext mild bilat UE edema, no LE edema Neuro is alert, Ox 3 , nf       Home meds:   - metoprolol 50 bid/ metolazone 2.5 qd/ lisinopril 40 qd/ torsemide 100 bid/ amlodipine 10/ losartan 100/ amlodipine  - methadone 10 tid prn/ naloxegol oxalate 25 qd  - insulin glargine 20 hs/ ? Metformin   - gabapentin 300 tid prn/ duloxetine 60 bid/ trazodone 300 hs/ ? lyrica  - pantoprazole 40 qd/ atorvastatin 40   UA turbid, red, >50 rbc and 11-20 wbc   UN 65  UCr  114  Renal US > IMPRESSION:    Limited visualization of the kidneys due to body habitus. No obstruction. Normal renal size. Urinary bladder empty with Foley catheter.  Assessment/ Plan: 1. Acute renal failure - combination of IV contrast/ hypotension-shock and ARB/ ACEi on board. Creat rise today is not as large, mid 6's. No gross uremic signs, no indication for RRT at this time. Hopefully will recover shortly. Cont supportive care, will follow.  2. Septic shock/ abd pain - suspected gallstone pancreatitis 3. HTN -long hx 4. Chronic pain 5. Depression 6. Chronic CHF - no signs pulm edema, lungs are clear 7. Vol excess- up 4-5 kg since admit, tolerating well at this time      Kelly Splinter 01/24/2019, 6:06 PM  Iron/TIBC/Ferritin/ %Sat    Component Value Date/Time   IRON 35 (L) 09/16/2013 0243   TIBC 397 09/16/2013 0243   FERRITIN 11 (L) 09/16/2013 0243   IRONPCTSAT 9 (L) 09/16/2013 0243   Recent Labs  Lab 01/21/19 2305  01/24/19 0150  NA  --    < > 134*  K  --    < > 4.0  CL  --    < > 97*  CO2  --    < >  22  GLUCOSE  --    < > 161*  BUN  --    < > 63*  CREATININE  --    < > 6.69*  CALCIUM  --    < > 8.1*  PHOS  --    < > 4.0  ALBUMIN  --    < > 2.4*  INR 1.4*  --   --    < > = values in this interval not displayed.   Recent Labs  Lab 01/24/19 0150  AST 54*  ALT 87*  ALKPHOS 107  BILITOT 1.3*  PROT 6.4*   Recent Labs  Lab 01/24/19 0150  WBC 11.6*  HGB 10.9*  HCT 34.8*  PLT 197

## 2019-01-24 NOTE — Consult Note (Signed)
    CC:  Subjective: He says his abdominal pain is better , but his renal function is worse.  Tolerating clears, waiting for HIDA scan.  Objective: Vital signs in last 24 hours: Temp:  [97.8 F (36.6 C)-98.9 F (37.2 C)] 97.8 F (36.6 C) (11/10 1200) Pulse Rate:  [76-93] 82 (11/10 1200) Resp:  [8-26] 23 (11/10 1200) BP: (127-184)/(57-102) 140/81 (11/10 1200) SpO2:  [88 %-97 %] 91 % (11/10 1200) Weight:  [165.5 kg] 165.5 kg (11/10 0500) Last BM Date: 01/24/19 Afebrile vital signs are stable. Creatinine up markedly 6.69. Bilirubin has dropped from 5.5-1.3 today WBC improving HIDA -  scan pending Intake/Output from previous day: 11/09 0701 - 11/10 0700 In: 1294.5 [I.V.:944.5; IV Piggyback:349.9] Out: 525 [Urine:525] Intake/Output this shift: Total I/O In: 100 [IV Piggyback:100] Out: 150 [Urine:150]  General appearance: alert, cooperative and no distress Resp: clear to auscultation bilaterally GI: Morbidly obese, less tender passing gas, positive BM. Some ongoing discomfort RUQ, but much better.   Lab Results:  Recent Labs    01/23/19 0340 01/24/19 0150  WBC 14.1* 11.6*  HGB 11.2* 10.9*  HCT 36.2* 34.8*  PLT 178 197    BMET Recent Labs    01/23/19 0340 01/24/19 0150  NA 135 134*  K 4.0 4.0  CL 99 97*  CO2 25 22  GLUCOSE 152* 161*  BUN 52* 63*  CREATININE 5.25* 6.69*  CALCIUM 8.0* 8.1*   PT/INR Recent Labs    01/21/19 2305  LABPROT 16.8*  INR 1.4*    Recent Labs  Lab 01/21/19 0827 01/21/19 1817 01/22/19 0102 01/23/19 0340 01/24/19 0150  AST 36 449* 277* 102* 54*  ALT 26 298* 230* 130* 87*  ALKPHOS 63 116 102 92 107  BILITOT 1.5* 5.5* 5.3* 2.3* 1.3*  PROT 8.2* 7.1 6.6 6.3* 6.4*  ALBUMIN 3.9 3.4* 3.1* 2.6* 2.4*     Lipase     Component Value Date/Time   LIPASE 38 01/24/2019 0150     Medications: . Chlorhexidine Gluconate Cloth  6 each Topical Daily  . DULoxetine  60 mg Oral Daily  . heparin injection (subcutaneous)  5,000 Units  Subcutaneous Q8H  . insulin aspart  0-5 Units Subcutaneous QHS  . insulin aspart  0-9 Units Subcutaneous TID WC  . insulin glargine  20 Units Subcutaneous Q2200  . mouth rinse  15 mL Mouth Rinse BID  . pantoprazole (PROTONIX) IV  40 mg Intravenous Q12H  . simethicone  80 mg Oral QID  . sodium chloride flush  10-40 mL Intracatheter Q12H  . thiamine injection  100 mg Intravenous Daily  . traZODone  300 mg Oral QHS   . cefTRIAXone (ROCEPHIN)  IV Stopped (01/24/19 1349)  . metronidazole 500 mg (01/24/19 1356)   Assessment/Plan Sepsis/shock Respiratory failure with hypoxia Elevated LFTs - T Bil 5.5(11/7) >> 1.3 (11/10) Uncontrolled type 2 diabetes with diabetic neuropathy Hypertension GERD/Hx GI bleed AKI: creatinine  0.92(11/6) >>6.69(11/10) Hx AF Hx alcoholism/Gastric bypass Morbid obesity BMI 53.8  Pancreatitis, abdominal pain, cholelithiasis and jaundice  -MRI 11/8 shows marked peripancreatic fluid along with pain head related pancreatitis, cholelithiasis with mild gallbladder wall thickening intrahepatic or extrahepatic dilatation 4 mm  -HIDA scan pending  FEN: Clear liquid ID: Zosyn 11/7-11/9;  Rocephin/Flagyl 11/9  >> day 2 [id number] Follow up:  TBD  Plan:  Await HIDA and will follow with you.         LOS: 4 days    Rosary Filosa 01/24/2019 Please see Amion

## 2019-01-24 NOTE — Progress Notes (Signed)
PROGRESS NOTE    Phillip Maldonado  [name] DOB: [birth year] DOA: 01/20/2019 PCP: Patient, No Pcp Per   Brief Narrative:  HPI per Dr. Merton Border on 01/20/2019  Phillip Maldonado  is a 68 y.o. 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 fib, chronic abdominal pain presenting with acute onset of epigastric pain radiating to the right upper chest associated with nausea vomiting and no diarrhea.  The pain was radiating to the right chest and the patient had full work-up in the emergency room including an EKG, troponins CT of abdomen and pelvis with no significant abnormalities noted.  Patient denies any history of blood in his urine or stools.  Patient had subjective fevers early in the morning at 430 when he wake up with the abdominal pain.  Patient is on chronic pain medications but taking MiraLAX as well. Patient denies any cough or shortness of breath. Patient was recently treated for upper GI bleed and case discussed with Dr. Michail Sermon from GI. Work-up in the emergency room showed hyperglycemia otherwise most of his blood work is full and within the normal range when showed colonic diverticulosis  **Interim History Patient was still complaining of abdominal pain and WBC worsened.  CT was unrevealing but will obtain a right upper quadrant ultrasound as well as an abdominal ultrasound.  General surgery was consulted for further evaluation recommendations.  Throughout the day yesterday progressively started worsening and ended up going to the ICU and subsequently ended up in septic shock.  A central line was placed last night and was an arterial line in his given pressors.  Patient had acute liver injury as well as an acute kidney injury in the setting of his shock and because of his contrast.  Renal function has now worsened and nephrology was consulted for further evaluation.    LFTs started elevating and lipase  elevated to and likely suspected the patient had a gallstone pancreatitis secondary to obstructive pathologies is now trending down.  Was jaundiced a little bit yesterday morning but this is trending down.  Critical care is signed off the case now that he is off of pressors.  We will be obtaining a MRCP and have general surgery as well as gastroenterology weigh in further.  Patient is no longer requiring pressors at this time but blood pressure is on the lower side and will continue to monitor carefully.  He is now also having an acute kidney injury on CKD stage III and acute liver injury.  Patient attempted to go for HIDA scan was unsuccessful due to pain and anxiety.  Foley catheter was placed and he had some hematuria and now he is oligouric.  Will monitor strict I's and O's and daily weight appreciate renal evaluation. Will watch Strict I's and O's and Daily Weights.  Renal function is worsening and blood cultures grew out gram-negative bacteremia which is not speciated yet.  Gastroenterology signed off given his improvement in his LFTs and hepatic function as they have no further inpatient GI workup planned.  WBC is improving and general surgery recommends no plans for cholecystectomy this week but could possibly do it prior to discharge if he improves  Assessment & Plan:   Principal Problem:   Acute pancreatitis Active Problems:   History of gastric bypass   History of marginal ulcers   DM type 2, uncontrolled, with neuropathy (HCC)   CKD (chronic kidney disease) stage 3, GFR 30-59 ml/min  Abdominal pain   Septic shock ([place])   Encounter for central line placement   BMI 50.0-59.9, adult (HCC)   Elevated LFTs   Elevated amylase and lipase   Septic shock in the setting of right upper quadrant abdominal pain associated nausea vomiting and concern for acute cholecystitis and ? choledocholithiasis with obstructive jaundice and Gallstone pancreatitis as well as GNR Bacteremia  -Decompensated  yesterday and was transferred to the ICU- -On admission his WBC was 10.8 and had worsened to 22.2 and then slowly improved and is now 11.6 -Lactic acid level has trended down is now 1.3 -Given 2-1/2 L of fluid boluses and will be continued on maintenance IV fluid and was increased to 180mL but has now been stopped by nephrology -Was made n.p.o. given his pancreatitis and his lipase level trended up to 1054 and is now down to 38 -Start empiric antibiotics with IV Zosyn changed as below to IV Ceftriaxone  -CT of the Abdomen and Pelvis showed "No findings to account for reported symptoms. Colonic diverticulosis without evidence of diverticulitis."  However the report did mention a distended gallbladder -CT Abd and Pelvis reviewd from 09/28/2018 note showed Cholelithiasis but no evidence of Acute Cholecystitis -Right upper quadrant ultrasound showed "Extremely limited exam due to patient body habitus. Many structures were not visualized at all.  Diffusely increased echogenicity of the liver most commonly seen with hepatic steatosis.  Possible gallbladder wall thickening but poorly assessed." -I have asked general surgery to consult and they did not feel that was a surgical issue yesterday but now they feel that they could have possible acute cholecystitis and suspected a gallstone and a common bile duct -Dr. Harlow Asa recommends and if MRI demonstrates acute cholecystitis he may require percutaneous cholecystostomy tube given his leukocytosis and pancreatitis as this is not optimal timing for intervention at present -Patient was pancultured and Blood Cx Growing GNR with speciation still pending  -IV fluids were started lactated Ringer's at 75 mL's per hour and were increased to 100 mL/hr and stopped by Nephrology  -Subsequently patient went into shock and had to have a central line placed and started on pressors with Levophed which has now been weaned off.  He was given 2-1/2 L of fluid boluses yesterday -Diet  was made n.p.o. given his acute pancreatitis but advanced to CLD now -Pain control with IV morphine 2 mg every 4 hours -Patient was still having abdominal pain on palpation in the right upper quadrant had positive Murphy sign on my examination yesterday -MRCP done and showed "Motion degraded images. Mild peripancreatic fluid along the pancreatic head/uncinate process, likely related to the patient's known acute pancreatitis. No drainable fluid collection/pseudocyst. Cholelithiasis. Mild gallbladder wall thickening, possibly reflecting secondary inflammatory changes given acute pancreatitis. No intrahepatic or extrahepatic ductal dilatation. Common duct measures 4 mm. No choledocholithiasis is seen." -HIDA not able to be done due to patient tolerance.  -I suspect the patient may have had an obstructive biliary stone which is now passed given that trending down of his LFTs and T bili as well as a trending down of his lipase -Gastroenterology was consulted for further evaluation and  recommends continuing supportive care with IV fluids and pressor support as needed and have now signed off  -Continued with antibiotics with IV Zosyn and changed to IV Ceftriaxone and Flagyl -LFTs are trending down MRCP done as above -Surgery recommending likely outpatient cholecystectomy but could likely be done this admission if he improved -Continue monitor temperature curve as well as  daily CMP  Acute Respiratory Failure with Hypoxia in the setting of septic shock from likely above -Continuous pulse oximetry and maintain O2 saturation greater than 90% -Continue supplemental oxygen via nasal cannula and wean O2 as tolerated -Started Xopenex and Atrovent breathing treatments -Pulmonary has signed off the case -Chest x-ray showed "Bibasilar atelectasis/infiltrates again noted. No interim change. Small left pleural effusion cannot be excluded on today's exam. Stable cardiomegaly.  Interim removal of right IJ  line." -Continue to monitor respiratory status carefully and repeat CXR in AM   Hyperbilirubinemia, Abnormal LFTs and Obstructive jaundice -As above and now improving and likely patient passed p.o. -Obtained a an abdominal ultrasound as above -Acute hepatitis panel was negative -Patient's AST went from 24 and trended all the way up to 449 and ALT trended up from 24 and went elevated to 98 -Now AST is 54and trended down and now ALT is now 87 -Continue to monitor and trend as patient's T bili went from 0.8 is now 5.5 is now trending down at 1.3 -MRCP as above and showed no choledocholithiasis -Continue monitor and trend hepatic function panel and repeat CMP in a.m.  Uncontrolled Diabetes Mellitus Type 2 complicated by Diabetic Neuropathy -Continue with gabapentin 300 mg p.o. 3 times daily as needed -Hold his Metformin 1000 g p.o. twice daily -His hemoglobin A1c was 8.6 -CBGs have been ranging from 146-213;  -Continue with sensitive NovoLog sliding scale insulin before meals and at bedtime along with Lantus 20 units subcu daily at 10 PM -May need to consult diabetes education coordinator and will continue to monitor blood sugars carefully  Hypertensive Urgency, improved and now he became hypotensive in the setting of shock -Hypertensive urgency likely likely in the setting of abdominal pain but then became hypotensive in the setting of shock -Hold all antihypertensives at this time given his low blood pressure and he had just come off of pressors   GERD and Hx of GI Bleed -Given famotidine 20 mg IV once yesterday with pantoprazole 40 mg IV every 12h  AKI on chronic Kidney Disease Stage 3, worsening -BUN/creatinine went from 14/0.92 is now significantly worsened to 63/6.69 in the setting of hypotension, as well as IV contrast diabetes mellitus antibiotics with IV Zosyn -Avoid nephrotoxic medications if possible, contrast dyes as well as hypotension -Patient's nephrotoxic medications have  been held and antihypertensives have been held -Changed D5 NS to LR at 75 mL/hr increased to 100 mL/h but this was stopped by nephrology; patient received 2-1/2 L fluid boluses since yesterday; IV fluid has now been received continue by nephrology -Placed a Foley catheter, check a.m. renal ultrasound and urine electrolytes -Nephrology consulted for further evaluation recommendations given the patient is now oliguric and patient may require some dialysis -Patient was anuric the night before so nephrology was consulted and Foley catheter was placed but now he has some hematuria as well -Repeat CMP in a.m and appreciate further nephrology recommendations  Hematuria -Urinalysis and urine culture sent; in the setting of traumatic injury from Foley catheter insertion -Urinalysis was turbid and red in color and unable to be tested -Urinalysis showed rare bacteria, rare greater than 50 RBCs per high-power field and 11-20 WBCs -We will likely need to speak with urology but patient is making minimal urine so we will hold off for now  Chronic diastolic CHF -Unknown Type as unable to view ECHO in our system -Appears fairly compensated and appears slightly dry now -Held lisinopril 40 mg p.o. daily, metoprolol 51 p.o. twice  daily and torsemide 100 mg p.o. twice daily has been reduced down to 200 mg p.o. daily given his hypotension and shock -Strict I's and O's and daily weights; + 4.302 L liters as patient's IVF stopped yesterday -We will continue to monitor for signs and symptoms of volume overload as patient is getting IV fluid hydration with lactated Ringer's at 31 mL's per hour -Repeat echocardiogram showed an EF of 60 to 65% with grade 1 diastolic dysfunction -Currently does not have any signs of pulmonary edema -Repeat chest x-ray in a.m to evaluate for volume overload  HLD/Hypercholesterolemia -Held atorvastatin 40 mg p.o. nightly given his Hepatic dysfunction but may be able to resume given  improvement in LFTs in a.m.  History of Atrial Fibrillation -TSH was 2.396 -Now normal sinus rhythm with rate controlled -Continue to monitor on telemetry  History of Alcoholism and Gastric Bypass -IV Thiamine started at 100 mg q24h  History of Chronic Pain -Upon further review Patient no longer taking  Methadone 10 mg every 8 hours; -C/w as needed IV morphine 2 mg every 8 hours as needed and this was changed to 2 to 4 mg every 4 hours -Continue with bowel regimen as above and patient also takes Movantik at home which was held  Morbid Obesity -Estimated body mass index is 53.88 kg/m as calculated from the following:   Height as of this encounter: 5\' 9"  (1.753 m).   Weight as of this encounter: 165.5 kg. -Weight Loss and Dietary Counseling given   Hyponatremia -Mild with a sodium of 134 Continue monitor and trend and may need to be started back on IV fluids but will defer this to nephrology -Repeat CMP in a.m.  Normocytic Anemia -Likely in the setting of renal disease and AKI next-likely was hemoconcentrated on admission but blood count has fallen and now 10.9/34.8  -check anemia panel in a.m. -Continue to monitor for signs and symptoms of bleeding; currently had some mild hematuria but not making very much urine echo-repeat CBC in a.m.  DVT prophylaxis: Enoxaparin 40 mg subcu every 24 Code Status: FULL CODE Family Communication: No family present at bedside  Disposition Plan: Pending further improvement and evaluation by general surgery, gastroenterology improved back to baseline with urination and improvement in renal function  Consultants:   General Surgery  Gastroenterology  Pulmonary  Nephrology  Procedures:  Central line placed and patient underwent MRCP  ECHOCARDIOGRAM IMPRESSIONS    1. Left ventricular ejection fraction, by visual estimation, is 60 to 65%. The left ventricle has normal function. There is no left ventricular hypertrophy.  2. Left  ventricular diastolic parameters are consistent with Grade I diastolic dysfunction (impaired relaxation).  3. Global right ventricle has normal systolic function.The right ventricular size is normal. No increase in right ventricular wall thickness.  4. Left atrial size was mildly dilated.  5. Right atrial size was normal.  6. Moderate mitral annular calcification.  7. The mitral valve is normal in structure. No evidence of mitral valve regurgitation. No evidence of mitral stenosis.  8. The tricuspid valve is normal in structure. Tricuspid valve regurgitation is not demonstrated.  9. The aortic valve is tricuspid. Aortic valve regurgitation is not visualized. Mild aortic valve sclerosis without stenosis. 10. The pulmonic valve was normal in structure. Pulmonic valve regurgitation is not visualized. 11. The inferior vena cava is normal in size with greater than 50% respiratory variability, suggesting right atrial pressure of 3 mmHg. 12. The interatrial septum was not well visualized.  FINDINGS  Left  Ventricle: Left ventricular ejection fraction, by visual estimation, is 60 to 65%. The left ventricle has normal function. There is no left ventricular hypertrophy. Left ventricular diastolic parameters are consistent with Grade I diastolic  dysfunction (impaired relaxation). Normal left atrial pressure.  Right Ventricle: The right ventricular size is normal. No increase in right ventricular wall thickness. Global RV systolic function is has normal systolic function.  Left Atrium: Left atrial size was mildly dilated.  Right Atrium: Right atrial size was normal in size  Pericardium: There is no evidence of pericardial effusion.  Mitral Valve: The mitral valve is normal in structure. There is mild thickening of the mitral valve leaflet(s). There is mild calcification of the mitral valve leaflet(s). Moderate mitral annular calcification. No evidence of mitral valve stenosis by  observation. No  evidence of mitral valve regurgitation.  Tricuspid Valve: The tricuspid valve is normal in structure. Tricuspid valve regurgitation is not demonstrated.  Aortic Valve: The aortic valve is tricuspid. Aortic valve regurgitation is not visualized. Mild aortic valve sclerosis is present, with no evidence of aortic valve stenosis.  Pulmonic Valve: The pulmonic valve was normal in structure. Pulmonic valve regurgitation is not visualized.  Aorta: The aortic root, ascending aorta and aortic arch are all structurally normal, with no evidence of dilitation or obstruction.  Venous: The inferior vena cava is normal in size with greater than 50% respiratory variability, suggesting right atrial pressure of 3 mmHg.  IAS/Shunts: The interatrial septum was not well visualized. No ventricular septal defect is seen or detected. There is no evidence of an atrial septal defect.     LEFT VENTRICLE PLAX 2D LVIDd:         5.70 cm  Diastology LVIDs:         3.30 cm  LV e' lateral:   5.87 cm/s LV PW:         1.00 cm  LV E/e' lateral: 13.1 LV IVS:        1.00 cm  LV e' medial:    5.22 cm/s LVOT diam:     2.10 cm  LV E/e' medial:  14.8 LV SV:         116 ml LV SV Index:   40.08 LVOT Area:     3.46 cm    RIGHT VENTRICLE RV S prime:     17.70 cm/s TAPSE (M-mode): 2.9 cm  LEFT ATRIUM             Index LA diam:        4.20 cm 1.59 cm/m LA Vol (A2C):   42.2 ml 16.01 ml/m LA Vol (A4C):   52.0 ml 19.72 ml/m LA Biplane Vol: 51.6 ml 19.57 ml/m  AORTIC VALVE LVOT Vmax:   157.00 cm/s LVOT Vmean:  102.000 cm/s LVOT VTI:    0.220 m   AORTA Ao Root diam: 3.50 cm  MITRAL VALVE MV Area (PHT): 3.46 cm              SHUNTS MV PHT:        63.51 msec            Systemic VTI:  0.22 m MV Decel Time: 219 msec              Systemic Diam: 2.10 cm MV E velocity: 77.10 cm/s  103 cm/s MV A velocity: 121.00 cm/s 70.3 cm/s MV E/A ratio:  0.64        1.5   Antimicrobials:  Anti-infectives (From  admission, onward)  Start     Dose/Rate Route Frequency Ordered Stop   01/23/19 1400  piperacillin-tazobactam (ZOSYN) IVPB 2.25 g  Status:  Discontinued     2.25 g 100 mL/hr over 30 Minutes Intravenous Every 8 hours 01/23/19 0717 01/23/19 0947   01/23/19 1400  cefTRIAXone (ROCEPHIN) 2 g in sodium chloride 0.9 % 100 mL IVPB     2 g 200 mL/hr over 30 Minutes Intravenous Every 24 hours 01/23/19 0947     01/23/19 1400  metroNIDAZOLE (FLAGYL) IVPB 500 mg     500 mg 100 mL/hr over 60 Minutes Intravenous Every 8 hours 01/23/19 0947     01/21/19 1400  piperacillin-tazobactam (ZOSYN) IVPB 3.375 g  Status:  Discontinued     3.375 g 12.5 mL/hr over 240 Minutes Intravenous Every 8 hours 01/21/19 1325 01/23/19 0717     Subjective: Seen and examined at bedside and he was sitting in the chair at bedside and still complaining about some abdominal pain but states is much better than what it was.  Worried about his renal function and inability to urinate very much.  No nausea or vomiting.  Was anxious today.  No other concerns or complaints at this time.   Objective: Vitals:   01/24/19 1100 01/24/19 1200 01/24/19 1300 01/24/19 1400  BP: 138/71 140/81 (!) 152/78 (!) 193/89  Pulse: 76 82 86 88  Resp:  (!) 23 18 20   Temp:  97.8 F (36.6 C)    TempSrc:  Oral    SpO2: 94% 91% 94% 94%  Weight:      Height:        Intake/Output Summary (Last 24 hours) at 01/24/2019 1800 Last data filed at 01/24/2019 1600 Gross per 24 hour  Intake 400.07 ml  Output 400 ml  Net 0.07 ml   Filed Weights   01/21/19 1918 01/22/19 1227 01/24/19 0500  Weight: (!) 161 kg (!) 163.4 kg (!) 165.5 kg   Examination: Physical Exam:  Constitutional: WN/WD morbidly obese Caucasian male currently NAD and appears anxious sitting in a chair bedside Eyes: Lids and conjunctivae normal, sclerae anicteric  ENMT: External Ears, Nose appear normal. Grossly normal hearing. Mucous membranes are moist. Neck: Appears normal, supple, no  cervical masses, normal ROM, no appreciable thyromegaly; No JVD and CVC is removed  Respiratory: Diminished to auscultation bilaterally, no wheezing, rales, rhonchi or crackles. Normal respiratory effort and patient is not tachypenic. No accessory muscle use.  Wearing 3 L of supplemental oxygen via nasal cannula Cardiovascular: RRR, no murmurs / rubs / gallops. S1 and S2 auscultated.  Trace to 1+ extremity edema. Abdomen: Soft, tender to palpation, really distended secondary body habitus. Bowel sounds positive x4.  GU: Deferred.  Has a Foley catheter in place with minimal urine and just slightly blood-tinged Musculoskeletal: No clubbing / cyanosis of digits/nails. No joint deformity upper and lower extremities.  Skin: No rashes, lesions, ulcers on a limited skin evalution. No induration; Warm and dry.  Neurologic: CN 2-12 grossly intact with no focal deficits. Romberg sign and cerebellar reflexes not assessed.  Psychiatric: Normal judgment and insight. Alert and oriented x 3. Anxious mood and appropriate affect.   Data Reviewed: I have personally reviewed following labs and imaging studies  CBC: Recent Labs  Lab 01/21/19 0827 01/21/19 1817 01/22/19 0102 01/23/19 0340 01/24/19 0150  WBC 22.2* 18.1* 20.5* 14.1* 11.6*  NEUTROABS 18.2* 15.0* 16.7* 11.8* 9.1*  HGB 15.4 13.4 12.3* 11.2* 10.9*  HCT 49.0 43.1 38.1* 36.2* 34.8*  MCV 91.4 90.4 89.4  92.3 90.4  PLT 279 239 239 178 [id number]   Basic Metabolic Panel: Recent Labs  Lab 01/21/19 0827 01/21/19 1817 01/22/19 0102 01/23/19 0340 01/24/19 0150  NA 135 136 133* 135 134*  K 3.7 4.1 3.6 4.0 4.0  CL 93* 95* 97* 99 97*  CO2 25 24 24 25 22   GLUCOSE 245* 265* 212* 152* 161*  BUN 18 27* 33* 52* 63*  CREATININE 1.18 2.25* 2.99* 5.25* 6.69*  CALCIUM 9.0 8.6* 7.8* 8.0* 8.1*  MG 1.8 1.7 1.7 1.8 1.9  PHOS 3.2 2.4* 3.1 3.7 4.0   GFR: Estimated Creatinine Clearance: 16.5 mL/min (A) (by C-G formula based on SCr of 6.69 mg/dL (H)). Liver Function  Tests: Recent Labs  Lab 01/21/19 0827 01/21/19 1817 01/22/19 0102 01/23/19 0340 01/24/19 0150  AST 36 449* 277* 102* 54*  ALT 26 298* 230* 130* 87*  ALKPHOS 63 116 102 92 107  BILITOT 1.5* 5.5* 5.3* 2.3* 1.3*  PROT 8.2* 7.1 6.6 6.3* 6.4*  ALBUMIN 3.9 3.4* 3.1* 2.6* 2.4*   Recent Labs  Lab 01/20/19 1114 01/21/19 1332 01/21/19 1900 01/22/19 0822 01/23/19 0500 01/24/19 0150  LIPASE 31 1,054*  --  219* 79* 38  AMYLASE  --   --  509*  --   --   --    No results for input(s): AMMONIA in the last 168 hours. Coagulation Profile: Recent Labs  Lab 01/20/19 1114 01/21/19 2305  INR 1.0 1.4*   Cardiac Enzymes: Recent Labs  Lab 01/21/19 2305  CKTOTAL 115   BNP (last 3 results) No results for input(s): PROBNP in the last 8760 hours. HbA1C: No results for input(s): HGBA1C in the last 72 hours. CBG: Recent Labs  Lab 01/23/19 1647 01/23/19 2137 01/24/19 0747 01/24/19 1140 01/24/19 1620  GLUCAP 198* 201* 135* 156* 213*   Lipid Profile: Recent Labs    01/23/19 0340  CHOL 111  HDL 29*  LDLCALC 55  TRIG 136  CHOLHDL 3.8   Thyroid Function Tests: No results for input(s): TSH, T4TOTAL, FREET4, T3FREE, THYROIDAB in the last 72 hours. Anemia Panel: No results for input(s): VITAMINB12, FOLATE, FERRITIN, TIBC, IRON, RETICCTPCT in the last 72 hours. Sepsis Labs: Recent Labs  Lab 01/21/19 2305 01/22/19 0102  LATICACIDVEN 2.1* 1.3    Recent Results (from the past 240 hour(s))  SARS CORONAVIRUS 2 (TAT 6-24 HRS) Nasopharyngeal Nasopharyngeal Swab     Status: None   Collection Time: 01/20/19  6:20 PM   Specimen: Nasopharyngeal Swab  Result Value Ref Range Status   SARS Coronavirus 2 NEGATIVE NEGATIVE Final    Comment: (NOTE) SARS-CoV-2 target nucleic acids are NOT DETECTED. The SARS-CoV-2 RNA is generally detectable in upper and lower respiratory specimens during the acute phase of infection. Negative results do not preclude SARS-CoV-2 infection, do not rule  out co-infections with other pathogens, and should not be used as the sole basis for treatment or other patient management decisions. Negative results must be combined with clinical observations, patient history, and epidemiological information. The expected result is Negative. Fact Sheet for Patients: SugarRoll.be Fact Sheet for Healthcare Providers: https://www.woods-mathews.com/ This test is not yet approved or cleared by the [place] FDA and  has been authorized for detection and/or diagnosis of SARS-CoV-2 by FDA under an Emergency Use Authorization (EUA). This EUA will remain  in effect (meaning this test can be used) for the duration of the COVID-19 declaration under Section 56 4(b)(1) of the Act, 21 U.S.C. section 360bbb-3(b)(1), unless the authorization is terminated or  revoked sooner. Performed at [facility], [place] [address], [place], [place] [address]   Culture, Urine     Status: Abnormal   Collection Time: 01/21/19  1:03 PM   Specimen: Urine, Random  Result Value Ref Range Status   Specimen Description   Final    URINE, RANDOM Performed at [place] [address], [place], [place] [address]    Special Requests   Final    NONE Performed at [facility], [place] [address], [place], [place] [address]    Culture (A)  Final    40,000 COLONIES/mL MULTIPLE SPECIES PRESENT, SUGGEST RECOLLECTION   Report Status 01/23/2019 FINAL  Final  Culture, blood (routine x 2)     Status: None (Preliminary result)   Collection Time: 01/21/19  1:32 PM   Specimen: BLOOD  Result Value Ref Range Status   Specimen Description   Final    BLOOD RIGHT ANTECUBITAL Performed at [facility], [place] [address], [place], [place] [address]    Special Requests   Final    BOTTLES DRAWN AEROBIC AND ANAEROBIC Blood Culture adequate volume Performed at [place] [address],  [place], [place] [address]    Culture   Final    NO GROWTH 3 DAYS Performed at [facility], [place] [address], [place], [place] [address]    Report Status PENDING  Incomplete  Culture, blood (routine x 2)     Status: None (Preliminary result)   Collection Time: 01/21/19  1:33 PM   Specimen: BLOOD  Result Value Ref Range Status   Specimen Description   Final    BLOOD RIGHT HAND Performed at [place] [address], [place], [place] [address]    Special Requests   Final    BOTTLES DRAWN AEROBIC AND ANAEROBIC Blood Culture adequate volume Performed at [place] [address], [place], [place] [address]    Culture  Setup Time   Final    ANAEROBIC BOTTLE ONLY GRAM NEGATIVE RODS CRITICAL RESULT CALLED TO, READ BACK BY AND VERIFIED WITH: Irwin Brakeman [id number] 01/24/2019 T. Rosalia Hammers    Culture   Final    CULTURE REINCUBATED FOR BETTER GROWTH Performed at [facility], [place] [address] [place], [place] [address]    Report Status PENDING  Incomplete  Blood Culture ID Panel (Reflexed)     Status: None   Collection Time: 01/21/19  1:33 PM  Result Value Ref Range Status   Enterococcus species NOT DETECTED NOT DETECTED Final   Listeria monocytogenes NOT DETECTED NOT DETECTED Final   Staphylococcus species NOT DETECTED NOT DETECTED Final   Staphylococcus aureus (BCID) NOT DETECTED NOT DETECTED Final   Streptococcus species NOT DETECTED NOT DETECTED Final   Streptococcus agalactiae NOT DETECTED NOT DETECTED Final   Streptococcus pneumoniae NOT DETECTED NOT DETECTED Final   Streptococcus pyogenes NOT DETECTED NOT DETECTED Final   Acinetobacter baumannii NOT DETECTED NOT DETECTED Final   Enterobacteriaceae species NOT DETECTED NOT DETECTED Final   Enterobacter cloacae complex NOT DETECTED NOT DETECTED Final   Escherichia coli NOT DETECTED NOT DETECTED Final   Klebsiella oxytoca NOT DETECTED NOT DETECTED Final   Klebsiella pneumoniae NOT  DETECTED NOT DETECTED Final   Proteus species NOT DETECTED NOT DETECTED Final   Serratia marcescens NOT DETECTED NOT DETECTED Final   Haemophilus influenzae NOT DETECTED NOT DETECTED Final   Neisseria meningitidis NOT DETECTED NOT DETECTED Final  Pseudomonas aeruginosa NOT DETECTED NOT DETECTED Final   Candida albicans NOT DETECTED NOT DETECTED Final   Candida glabrata NOT DETECTED NOT DETECTED Final   Candida krusei NOT DETECTED NOT DETECTED Final   Candida parapsilosis NOT DETECTED NOT DETECTED Final   Candida tropicalis NOT DETECTED NOT DETECTED Final    Comment: Performed at [facility], [place] [address] [place], [place] [address]  MRSA PCR Screening     Status: None   Collection Time: 01/21/19  7:28 PM   Specimen: Nasal Mucosa; Nasopharyngeal  Result Value Ref Range Status   MRSA by PCR NEGATIVE NEGATIVE Final    Comment:        The GeneXpert MRSA Assay (FDA approved for NASAL specimens only), is one component of a comprehensive MRSA colonization surveillance program. It is not intended to diagnose MRSA infection nor to guide or monitor treatment for MRSA infections. Performed at [facility], [place] [address], [place], [place] [address]   Culture, Urine     Status: None   Collection Time: 01/23/19  2:02 PM   Specimen: Urine, Random  Result Value Ref Range Status   Specimen Description   Final    URINE, RANDOM Performed at [place] [address], [place], [place] [address]    Special Requests   Final    NONE Performed at [facility], [place] [address], [place], [place] [address]    Culture   Final    NO GROWTH Performed at [facility], [place] [address], [place], [place] [address]    Report Status 01/24/2019 FINAL  Final      Radiology Studies: US Renal  Result Date: 01/23/2019 CLINICAL DATA:  Acute kidney injury EXAM: RENAL / URINARY TRACT ULTRASOUND COMPLETE COMPARISON:  Ultrasound abdomen  01/21/2019 FINDINGS: Right Kidney: Renal measurements: 14.3 x 7.8 x 7.6 cm = volume: 439 mL. Negative for obstruction or mass. Limited visualization due to body habitus. Left Kidney: Renal measurements: 12.5 x 7.9 x 5.5 cm = volume: 285 mL. Negative for obstruction or mass. Limited evaluation left kidney due to body habitus. Bladder: Foley catheter.  Urinary bladder empty. Other: None. IMPRESSION: Limited visualization of the kidneys due to body habitus. No obstruction. Normal renal size. Urinary bladder empty with Foley catheter. Electronically Signed   By: Franchot Gallo M.D.   On: 01/23/2019 16:26   Dg Chest Port 1 View  Result Date: 01/24/2019 CLINICAL DATA:  Shortness of breath. EXAM: PORTABLE CHEST 1 VIEW COMPARISON:  01/23/2019. FINDINGS: Interim removal of right IJ line. Stable cardiomegaly. Bibasilar atelectasis/infiltrates., no change from prior exam. Small left pleural effusion cannot be excluded on today's exam. No pneumothorax. Prior cervical spine fusion. Surgical clips upper abdomen. IMPRESSION: 1. Bibasilar atelectasis/infiltrates again noted. No interim change. Small left pleural effusion cannot be excluded on today's exam. 2.  Stable cardiomegaly. 3.  Interim removal of right IJ line. Electronically Signed   By: Marcello Moores  Register   On: 01/24/2019 05:43   Dg Chest Port 1 View  Result Date: 01/23/2019 CLINICAL DATA:  Shortness of breath. EXAM: PORTABLE CHEST 1 VIEW COMPARISON:  01/22/2019. FINDINGS: Right IJ line noted with tip in stable position. Cardiomegaly. Bibasilar atelectasis/infiltrates again noted without interim change. No prominent pleural effusion. No pneumothorax. Prior cervical spine fusion. IMPRESSION: 1.  Right IJ line stable position. 2.  Stable cardiomegaly. 3. Bibasilar atelectasis/infiltrates again noted without interim change. Electronically Signed   By: Marcello Moores  Register   On: 01/23/2019  06:55   Scheduled Meds:  Chlorhexidine Gluconate Cloth  6 each Topical Daily    DULoxetine  60 mg Oral Daily   heparin injection (subcutaneous)  5,000 Units Subcutaneous Q8H   insulin aspart  0-5 Units Subcutaneous QHS   insulin aspart  0-9 Units Subcutaneous TID WC   insulin glargine  20 Units Subcutaneous Q2200   mouth rinse  15 mL Mouth Rinse BID   pantoprazole (PROTONIX) IV  40 mg Intravenous Q12H   simethicone  80 mg Oral QID   sodium chloride flush  10-40 mL Intracatheter Q12H   thiamine injection  100 mg Intravenous Daily   traZODone  300 mg Oral QHS   Continuous Infusions:  cefTRIAXone (ROCEPHIN)  IV Stopped (01/24/19 1349)   metronidazole Stopped (01/24/19 1456)    LOS: 4 days   Kerney Elbe, DO [facility] PAGER is on AMION  If 7PM-7AM, please contact night-coverage [email or web address] Password [email or web address] 01/24/2019, 6:00 PM

## 2019-01-24 NOTE — Progress Notes (Signed)
PHARMACY - PHYSICIAN COMMUNICATION CRITICAL VALUE ALERT - BLOOD CULTURE IDENTIFICATION (BCID)  Phillip Maldonado is an 68 y.o. male who presented to [facility] on 01/20/2019 with a chief complaint of abdominal pain.  Assessment:  Anaerobic bottle of 1 set + GNR- nothing detected on BCID.  Already on empiric antibiotics for IAI.    Name of physician (or Provider) Contacted: Bodenheimer  Current antibiotics: Rocephin 2gm IV q24h + Flagyl 500mg  IV q8h  Changes to prescribed antibiotics recommended:  Patient is on recommended antibiotics - No changes needed  Results for orders placed or performed during the hospital encounter of 01/20/19  Blood Culture ID Panel (Reflexed) (Collected: 01/21/2019  1:33 PM)  Result Value Ref Range   Enterococcus species NOT DETECTED NOT DETECTED   Listeria monocytogenes NOT DETECTED NOT DETECTED   Staphylococcus species NOT DETECTED NOT DETECTED   Staphylococcus aureus (BCID) NOT DETECTED NOT DETECTED   Streptococcus species NOT DETECTED NOT DETECTED   Streptococcus agalactiae NOT DETECTED NOT DETECTED   Streptococcus pneumoniae NOT DETECTED NOT DETECTED   Streptococcus pyogenes NOT DETECTED NOT DETECTED   Acinetobacter baumannii NOT DETECTED NOT DETECTED   Enterobacteriaceae species NOT DETECTED NOT DETECTED   Enterobacter cloacae complex NOT DETECTED NOT DETECTED   Escherichia coli NOT DETECTED NOT DETECTED   Klebsiella oxytoca NOT DETECTED NOT DETECTED   Klebsiella pneumoniae NOT DETECTED NOT DETECTED   Proteus species NOT DETECTED NOT DETECTED   Serratia marcescens NOT DETECTED NOT DETECTED   Haemophilus influenzae NOT DETECTED NOT DETECTED   Neisseria meningitidis NOT DETECTED NOT DETECTED   Pseudomonas aeruginosa NOT DETECTED NOT DETECTED   Candida albicans NOT DETECTED NOT DETECTED   Candida glabrata NOT DETECTED NOT DETECTED   Candida krusei NOT DETECTED NOT DETECTED   Candida parapsilosis NOT DETECTED NOT DETECTED   Candida tropicalis NOT  DETECTED NOT DETECTED    Netta Cedars, PharmD, BCPS 01/24/2019  5:22 AM

## 2019-01-24 NOTE — Progress Notes (Signed)
[facility] night shift.  The patient had a neck pain radiating though his chest. He was helped to the commode by the staff and stated that he thinks he may have gotten a transient panic reaction. His pain improved with morphine IV. EKG was non-ischemic. His first troponin result is normal. Will be following second troponin measurement. Increase frequency and dose of morphine given the patient's weight.   Tennis Must, MD

## 2019-01-25 ENCOUNTER — Inpatient Hospital Stay (HOSPITAL_COMMUNITY): Payer: Medicare Other

## 2019-01-25 DIAGNOSIS — N183 Chronic kidney disease, stage 3 unspecified: Secondary | ICD-10-CM

## 2019-01-25 DIAGNOSIS — K851 Biliary acute pancreatitis without necrosis or infection: Secondary | ICD-10-CM

## 2019-01-25 LAB — COMPREHENSIVE METABOLIC PANEL
ALT: 65 U/L — ABNORMAL HIGH (ref 0–44)
AST: 37 U/L (ref 15–41)
Albumin: 2.6 g/dL — ABNORMAL LOW (ref 3.5–5.0)
Alkaline Phosphatase: 122 U/L (ref 38–126)
Anion gap: 15 (ref 5–15)
BUN: 75 mg/dL — ABNORMAL HIGH (ref 8–23)
CO2: 21 mmol/L — ABNORMAL LOW (ref 22–32)
Calcium: 8.3 mg/dL — ABNORMAL LOW (ref 8.9–10.3)
Chloride: 98 mmol/L (ref 98–111)
Creatinine, Ser: 8.26 mg/dL — ABNORMAL HIGH (ref 0.61–1.24)
GFR calc Af Amer: 7 mL/min — ABNORMAL LOW (ref >60)
GFR calc non Af Amer: 6 mL/min — ABNORMAL LOW (ref >60)
Glucose, Bld: 175 mg/dL — ABNORMAL HIGH (ref 70–99)
Potassium: 4.3 mmol/L (ref 3.5–5.1)
Sodium: 134 mmol/L — ABNORMAL LOW (ref 135–145)
Total Bilirubin: 1.2 mg/dL (ref 0.3–1.2)
Total Protein: 6.8 g/dL (ref 6.5–8.1)

## 2019-01-25 LAB — CBC WITH DIFFERENTIAL/PLATELET
Abs Immature Granulocytes: 0.07 10*3/uL (ref 0.00–0.07)
Basophils Absolute: 0 10*3/uL (ref 0.0–0.1)
Basophils Relative: 0 %
Eosinophils Absolute: 0.2 10*3/uL (ref 0.0–0.5)
Eosinophils Relative: 2 %
HCT: 36.7 % — ABNORMAL LOW (ref 39.0–52.0)
Hemoglobin: 11.5 g/dL — ABNORMAL LOW (ref 13.0–17.0)
Immature Granulocytes: 1 %
Lymphocytes Relative: 10 %
Lymphs Abs: 1.2 10*3/uL (ref 0.7–4.0)
MCH: 28.6 pg (ref 26.0–34.0)
MCHC: 31.3 g/dL (ref 30.0–36.0)
MCV: 91.3 fL (ref 80.0–100.0)
Monocytes Absolute: 1 10*3/uL (ref 0.1–1.0)
Monocytes Relative: 9 %
Neutro Abs: 8.7 10*3/uL — ABNORMAL HIGH (ref 1.7–7.7)
Neutrophils Relative %: 78 %
Platelets: 214 10*3/uL (ref 150–400)
RBC: 4.02 MIL/uL — ABNORMAL LOW (ref 4.22–5.81)
RDW: 16.6 % — ABNORMAL HIGH (ref 11.5–15.5)
WBC: 11.2 10*3/uL — ABNORMAL HIGH (ref 4.0–10.5)
nRBC: 0 % (ref 0.0–0.2)

## 2019-01-25 LAB — PHOSPHORUS: Phosphorus: 4.8 mg/dL — ABNORMAL HIGH (ref 2.5–4.6)

## 2019-01-25 LAB — GLUCOSE, CAPILLARY
Glucose-Capillary: 152 mg/dL — ABNORMAL HIGH (ref 70–99)
Glucose-Capillary: 234 mg/dL — ABNORMAL HIGH (ref 70–99)
Glucose-Capillary: 201 mg/dL — ABNORMAL HIGH (ref 70–99)

## 2019-01-25 LAB — MAGNESIUM: Magnesium: 2 mg/dL (ref 1.7–2.4)

## 2019-01-25 LAB — TROPONIN I (HIGH SENSITIVITY)
Troponin I (High Sensitivity): 12 ng/L (ref <18)
Troponin I (High Sensitivity): 14 ng/L (ref <18)

## 2019-01-25 MED ORDER — HEPARIN SODIUM (PORCINE) 1000 UNIT/ML IJ SOLN
3000.0000 [IU] | Freq: Once | INTRAMUSCULAR | Status: AC
  Administered 2019-01-25: 2400 [IU] via INTRAVENOUS
  Filled 2019-01-25: qty 3

## 2019-01-25 MED ORDER — CHLORHEXIDINE GLUCONATE CLOTH 2 % EX PADS
6.0000 | MEDICATED_PAD | Freq: Every day | CUTANEOUS | Status: DC
  Administered 2019-01-27 – 2019-01-30 (×3): 6 via TOPICAL

## 2019-01-25 MED ORDER — HEPARIN SODIUM (PORCINE) 1000 UNIT/ML IJ SOLN
INTRAMUSCULAR | Status: AC
  Administered 2019-01-25: 1000 [IU]
  Filled 2019-01-25: qty 2

## 2019-01-25 MED ORDER — MORPHINE SULFATE (PF) 4 MG/ML IV SOLN
INTRAVENOUS | Status: AC
  Filled 2019-01-25: qty 1

## 2019-01-25 NOTE — Progress Notes (Addendum)
Phillip Maldonado Progress Note  Subjective: creat up to 8, uop only 500 cc, pt states "tight" in hands and feet, had some CP last night, denies SOB.   Vitals:   01/25/19 0800 01/25/19 0900 01/25/19 1000 01/25/19 1200  BP: 138/60 125/61 (!) 132/58   Pulse: 83 85 91   Resp:  19    Temp: 98.7 F (37.1 C)   97.9 F (36.6 C)  TempSrc: Oral   Oral  SpO2: 96% 96% 97%   Weight:      Height:        Inpatient medications: . Chlorhexidine Gluconate Cloth  6 each Topical Daily  . DULoxetine  60 mg Oral Daily  . heparin injection (subcutaneous)  5,000 Units Subcutaneous Q8H  . insulin aspart  0-5 Units Subcutaneous QHS  . insulin aspart  0-9 Units Subcutaneous TID WC  . insulin glargine  20 Units Subcutaneous Q2200  . mouth rinse  15 mL Mouth Rinse BID  . pantoprazole (PROTONIX) IV  40 mg Intravenous Q12H  . simethicone  80 mg Oral QID  . sodium chloride flush  10-40 mL Intracatheter Q12H  . thiamine injection  100 mg Intravenous Daily  . traZODone  300 mg Oral QHS   . cefTRIAXone (ROCEPHIN)  IV Stopped (01/25/19 1404)  . metronidazole 500 mg (01/25/19 1405)   bisacodyl, levalbuterol, morphine injection, ondansetron **OR** ondansetron (ZOFRAN) IV, polyethylene glycol, sodium chloride flush    Exam: Gen alert, on distress No rash, cyanosis or gangrene Sclera anicteric, throat clear  +JVD Chest mild rales bibasilar RRR no MRG Abd soft ntnd no mass or ascites +bs GU normal male w foley in place defe MS no joint effusions or deformity Ext mild bilat UE edema, no LE edema Neuro is alert, Ox 3 , nf       Home meds:   - metoprolol 50 bid/ metolazone 2.5 qd/ lisinopril 40 qd/ torsemide 100 bid/ amlodipine 10/ losartan 100/ amlodipine  - methadone 10 tid prn/ naloxegol oxalate 25 qd  - insulin glargine 20 hs/ ? Metformin   - gabapentin 300 tid prn/ duloxetine 60 bid/ trazodone 300 hs/ ? lyrica  - pantoprazole 40 qd/ atorvastatin 40   UA turbid, red, >50 rbc and  11-20 wbc   UN 65  UCr 114  Renal US > IMPRESSION:    Limited visualization of the kidneys due to body habitus. No obstruction. Normal renal size. Urinary bladder empty with Foley catheter.  Assessment/ Plan: 1. Acute renal failure - combination of IV contrast/ hypotension-shock and ARB/ ACEi on board. Creat worsening, sig vol excess on exam.  I feel will need temporary dialysis. Will see if can get temp cath placed.  Would like to dialyze later tonight ideally if possible. Pt placement thinks there may be a tele bed later this evening. Orders written.  2. Septic shock/ abd pain - suspected gallstone pancreatitis 3. HTN -long hx 4. Chronic pain 5. Depression 6. Chronic CHF - no signs pulm edema, lungs are clear 7. Vol excess      Rob Benicia Bergevin 01/25/2019, 2:46 PM  Iron/TIBC/Ferritin/ %Sat    Component Value Date/Time   IRON 35 (L) 09/16/2013 0243   TIBC 397 09/16/2013 0243   FERRITIN 11 (L) 09/16/2013 0243   IRONPCTSAT 9 (L) 09/16/2013 0243   Recent Labs  Lab 01/21/19 2305  01/25/19 0143  NA  --    < > 134*  K  --    < > 4.3  CL  --    < >  98  CO2  --    < > 21*  GLUCOSE  --    < > 175*  BUN  --    < > 75*  CREATININE  --    < > 8.26*  CALCIUM  --    < > 8.3*  PHOS  --    < > 4.8*  ALBUMIN  --    < > 2.6*  INR 1.4*  --   --    < > = values in this interval not displayed.   Recent Labs  Lab 01/25/19 0143  AST 37  ALT 65*  ALKPHOS 122  BILITOT 1.2  PROT 6.8   Recent Labs  Lab 01/25/19 0143  WBC 11.2*  HGB 11.5*  HCT 36.7*  PLT 214

## 2019-01-25 NOTE — Progress Notes (Signed)
Pt transported to dialysis by transporter. Will continue to monitor pt. Ranelle Oyster, RN

## 2019-01-25 NOTE — Progress Notes (Addendum)
Report given to [facility] and receiving Rn 5W. Patient given 4mg  Morphine for knee pain prior to transport. Patient with no other complaints at this current time. Spouse at bedside. Transferring to [facility] for HD.

## 2019-01-25 NOTE — Progress Notes (Signed)
Patient transferred from [facility] via [facility]. Received report from Helene Kelp, Therapist, sports at [facility]. Patient A&Ox4, on 2L/[place], and able to voice needs. Skin assessed by two RNs, no signs of breakdown noted, bruising scattered. Foley in place. Will continue to monitor and treat per MD orders.

## 2019-01-25 NOTE — Procedures (Signed)
Central Venous dialysis Catheter Insertion Procedure Note HENOCK BANAGA [id number] 02/04/1951  Procedure: Insertion of Central Venous Catheter Indications: dialysis  Procedure Details Consent: Risks of procedure as well as the alternatives and risks of each were explained to the (patient/caregiver).  Consent for procedure obtained. Time Out: Verified patient identification, verified procedure, site/side was marked, verified correct patient position, special equipment/implants available, medications/allergies/relevent history reviewed, required imaging and test results available.  Performed  -real time [place] used to ID and cannulate vessel   Maximum sterile technique was used including antiseptics, cap, gloves, gown, hand hygiene, mask and sheet. Skin prep: Chlorhexidine; local anesthetic administered A antimicrobial bonded/coated triple lumen catheter was placed in the right internal jugular vein using the Seldinger technique.  Evaluation Blood flow good Complications: No apparent complications Patient did tolerate procedure well. Chest X-ray ordered to verify placement.  CXR: pending.  Clementeen Graham 01/25/2019, 4:15 PM  Erick Colace ACNP-BC [place] Pager # [phone] OR # [phone] if no answer

## 2019-01-25 NOTE — Progress Notes (Signed)
PROGRESS NOTE  Phillip Maldonado  DOB: [birth year]  PCP: Patient, No Pcp Per [id number]  DOA: 01/20/2019  LOS: 5 days   Brief narrative: Phillip Maldonado is a 68 y.o. male with PMH of HTN, HLD, DM2, morbid obesity, sleep apnea, A. fib, CHF, CKD 3, chronic anemia, chronic hepatitis B, fatty liver, anxiety, depression, degenerative joint disease, history of Roux-en-Y gastrojejunostomy and a recent hospitalization for GI bleeding. Patient presented to the ED on 01/20/2019 with complaint of progressively worsening epigastric and right upper quadrant abdominal pain.  Work-up in the ED was nonrevealing, normal creatinine, normal liver enzymes, normal lipase CT scan abdomen and ultrasound did not show any biliary duct dilatation, or any other acute abdominal pathology. He was initially admitted to the floors under hospitalist medicine service for gallstone pancreatitis. Required transfer to ICU on 11/7 for septic shock. Subsequent blood work showed elevated liver enzymes, lipase and creatinine. MRCP was obtained which showed acute pancreatitis, cholelithiasis, no CBD obstruction. The patient was suspected to have gallstone pancreatitis.   General surgery was consulted.  Patient was not medically stable enough for surgical intervention. He was eventually weaned off pressors, hemodynamically stabilized and transferred out of ICU. Lipase peaked at 1054 and started to trend down. Liver enzymes also improved down to normal. However, creatinine continued to trend up and has peaked today at 8.26. Likely heading to dialysis. As per discussion with nephrologist Dr. Jonnie Finner, will plan to transfer patient to [facility] for possible need of hemodialysis.  Subjective: Patient was seen and examined this morning.  Morbidly obese elderly Caucasian male.  Sitting up in chair.  Feels very tired.  Could not sleep well last night.  Alert, awake, oriented x3, on O2 2 to 3 L by nasal cannula.  Seem to have  anasarca as well.  Assessment/Plan: Septic shock likely secondary to acute cholecystitis Acute gallstone pancreatitis Elevated liver enzymes -Patient was not septic on presentation but probably had evolving sepsis.  Within 48 hours, WBC serviced, liver enzymes and lipase level increased, patient became hypotensive requiring transfer to ICU for pressors.  Subsequently improved and transferred out of ICU. -Patient probably had obstructive biliary stent which spontaneously passed.  -Liver enzymes, lipase level as well as WBC count subsequently improved. -Diet advanced to regular consistency. -Previous hospitalist note suggest gram-negative rod in blood which I do not see. -Currently on IV Rocephin and IV Flagyl which we will continue. -Medically unstable for cholecystectomy at this time.  Surgery recommends outpatient follow-up.  Acute kidney injury -Presented with normal creatinine.  With septic shock, creatinine increased and continued to worsen despite resolution of shock and aggressive IV hydration. Creatinine up at 8.26 today. -Likely heading to dialysis.  Nephrology Dr. Jonnie Finner is following.  Acute respiratory failure with hypoxia -History of COPD, CHF, OSA but not on oxygen supplementation at home. -Currently on low-flow oxygen by nasal pillow.  Expected improved with fluid removal if dialyzed. -Continue to wean down.  Cardiovascular issues: HTN, HLD, A. fib, CHF -Home meds include metoprolol, amlodipine, losartan, torsemide, lisinopril, Lipitor -Currently not on any blood pressure medicines.  Blood pressure remains stable. -Lipitor on hold because of liver injury.  Type 2 diabetes mellitus -A1c 8.6 -Home meds include Metformin, Lantus. -Continue to hold Metformin.  Currently on Lantus 20 units subcu daily along with sliding scale insulin and Accu-Cheks.  May need to adjust insulin with change in renal function.  GERD/ History of GI bleed -Continue PPI  Morbid obesity /sleep  apnea -Body mass index  is 53.88 kg/m. Patient has been advised to make an attempt to improve diet and exercise patterns to aid in weight loss.  -Does not use CPAP at home.  Mobility: Encourage ambulation DVT prophylaxis:  Heparin subcu Code Status:   Code Status: Full Code  Family Communication:  Expected Discharge:  Transferred to [facility] today  Consultants:  Nephrology  General surgery/GI signed  Procedures:  None  Antimicrobials: Anti-infectives (From admission, onward)   Start     Dose/Rate Route Frequency Ordered Stop   01/23/19 1400  piperacillin-tazobactam (ZOSYN) IVPB 2.25 g  Status:  Discontinued     2.25 g 100 mL/hr over 30 Minutes Intravenous Every 8 hours 01/23/19 0717 01/23/19 0947   01/23/19 1400  cefTRIAXone (ROCEPHIN) 2 g in sodium chloride 0.9 % 100 mL IVPB     2 g 200 mL/hr over 30 Minutes Intravenous Every 24 hours 01/23/19 0947     01/23/19 1400  metroNIDAZOLE (FLAGYL) IVPB 500 mg     500 mg 100 mL/hr over 60 Minutes Intravenous Every 8 hours 01/23/19 0947     01/21/19 1400  piperacillin-tazobactam (ZOSYN) IVPB 3.375 g  Status:  Discontinued     3.375 g 12.5 mL/hr over 240 Minutes Intravenous Every 8 hours 01/21/19 1325 01/23/19 0717      Diet Order            Diet clear liquid Room service appropriate? Yes; Fluid consistency: Thin  Diet effective now              Infusions:  . cefTRIAXone (ROCEPHIN)  IV Stopped (01/24/19 1349)  . metronidazole Stopped (01/25/19 [id number])    Scheduled Meds: . Chlorhexidine Gluconate Cloth  6 each Topical Daily  . DULoxetine  60 mg Oral Daily  . heparin injection (subcutaneous)  5,000 Units Subcutaneous Q8H  . insulin aspart  0-5 Units Subcutaneous QHS  . insulin aspart  0-9 Units Subcutaneous TID WC  . insulin glargine  20 Units Subcutaneous Q2200  . mouth rinse  15 mL Mouth Rinse BID  . pantoprazole (PROTONIX) IV  40 mg Intravenous Q12H  . simethicone  80 mg Oral QID  . sodium chloride flush  10-40 mL  Intracatheter Q12H  . thiamine injection  100 mg Intravenous Daily  . traZODone  300 mg Oral QHS    PRN meds: bisacodyl, levalbuterol, morphine injection, ondansetron **OR** ondansetron (ZOFRAN) IV, polyethylene glycol, sodium chloride flush   Objective: Vitals:   01/25/19 0900 01/25/19 1000  BP: 125/61 (!) 132/58  Pulse: 85 91  Resp: 19   Temp:    SpO2: 96% 97%    Intake/Output Summary (Last 24 hours) at 01/25/2019 1141 Last data filed at 01/25/2019 1000 Gross per 24 hour  Intake 640.07 ml  Output 525 ml  Net 115.07 ml   Filed Weights   01/22/19 1227 01/24/19 0500 01/25/19 0500  Weight: (!) 163.4 kg (!) 165.5 kg (!) 165.5 kg   Weight change: 0 kg Body mass index is 53.88 kg/m.   Physical Exam: General exam: Appears stressed, tired Skin: No rashes, lesions or ulcers. HEENT: Atraumatic, normocephalic, supple neck, no obvious bleeding Lungs: Diminished air entry in both bases, no crackles CVS: Regular rate and rhythm, no murmur GI/Abd soft, distended from obesity, bowel sound present CNS: Alert, awake, oriented x3 Psychiatry: Sad affect, anxious Extremities: Pedal edema 1-2+ bilaterally, no calf tenderness  Data Review: I have personally reviewed the laboratory data and studies available.  Recent Labs  Lab 01/21/19 1817 01/22/19 0102  01/23/19 0340 01/24/19 0150 01/25/19 0143  WBC 18.1* 20.5* 14.1* 11.6* 11.2*  NEUTROABS 15.0* 16.7* 11.8* 9.1* 8.7*  HGB 13.4 12.3* 11.2* 10.9* 11.5*  HCT 43.1 38.1* 36.2* 34.8* 36.7*  MCV 90.4 89.4 92.3 90.4 91.3  PLT 239 239 178 197 214   Recent Labs  Lab 01/21/19 1817 01/22/19 0102 01/23/19 0340 01/24/19 0150 01/25/19 0143  NA 136 133* 135 134* 134*  K 4.1 3.6 4.0 4.0 4.3  CL 95* 97* 99 97* 98  CO2 24 24 25 22  21*  GLUCOSE 265* 212* 152* 161* 175*  BUN 27* 33* 52* 63* 75*  CREATININE 2.25* 2.99* 5.25* 6.69* 8.26*  CALCIUM 8.6* 7.8* 8.0* 8.1* 8.3*  MG 1.7 1.7 1.8 1.9 2.0  PHOS 2.4* 3.1 3.7 4.0 4.8*    Terrilee Croak, MD  [facility] 01/25/2019

## 2019-01-26 LAB — BASIC METABOLIC PANEL
Anion gap: 17 — ABNORMAL HIGH (ref 5–15)
BUN: 54 mg/dL — ABNORMAL HIGH (ref 8–23)
CO2: 20 mmol/L — ABNORMAL LOW (ref 22–32)
Calcium: 8.3 mg/dL — ABNORMAL LOW (ref 8.9–10.3)
Chloride: 99 mmol/L (ref 98–111)
Creatinine, Ser: 7.31 mg/dL — ABNORMAL HIGH (ref 0.61–1.24)
GFR calc Af Amer: 8 mL/min — ABNORMAL LOW (ref >60)
GFR calc non Af Amer: 7 mL/min — ABNORMAL LOW (ref >60)
Glucose, Bld: 187 mg/dL — ABNORMAL HIGH (ref 70–99)
Potassium: 4.3 mmol/L (ref 3.5–5.1)
Sodium: 136 mmol/L (ref 135–145)

## 2019-01-26 LAB — CULTURE, BLOOD (ROUTINE X 2)
Culture: NO GROWTH
Special Requests: ADEQUATE
Special Requests: ADEQUATE

## 2019-01-26 LAB — GLUCOSE, CAPILLARY
Glucose-Capillary: 134 mg/dL — ABNORMAL HIGH (ref 70–99)
Glucose-Capillary: 159 mg/dL — ABNORMAL HIGH (ref 70–99)
Glucose-Capillary: 190 mg/dL — ABNORMAL HIGH (ref 70–99)
Glucose-Capillary: 194 mg/dL — ABNORMAL HIGH (ref 70–99)
Glucose-Capillary: 195 mg/dL — ABNORMAL HIGH (ref 70–99)

## 2019-01-26 LAB — SYNOVIAL CELL COUNT + DIFF, W/ CRYSTALS
Eosinophils-Synovial: 0 % (ref 0–1)
Lymphocytes-Synovial Fld: 4 % (ref 0–20)
Monocyte-Macrophage-Synovial Fluid: 34 % — ABNORMAL LOW (ref 50–90)
Neutrophil, Synovial: 59 % — ABNORMAL HIGH (ref 0–25)
WBC, Synovial: 460 /mm3 — ABNORMAL HIGH (ref 0–200)

## 2019-01-26 MED ORDER — METHYLPREDNISOLONE ACETATE 80 MG/ML IJ SUSP
80.0000 mg | Freq: Once | INTRAMUSCULAR | Status: AC
  Administered 2019-01-26: 80 mg via INTRA_ARTICULAR
  Filled 2019-01-26: qty 1

## 2019-01-26 MED ORDER — CHLORHEXIDINE GLUCONATE CLOTH 2 % EX PADS
6.0000 | MEDICATED_PAD | Freq: Every day | CUTANEOUS | Status: DC
  Administered 2019-01-29 – 2019-01-30 (×2): 6 via TOPICAL

## 2019-01-26 MED ORDER — HYDROMORPHONE HCL 1 MG/ML IJ SOLN
1.0000 mg | INTRAMUSCULAR | Status: DC | PRN
  Administered 2019-01-27 – 2019-02-01 (×17): 1 mg via INTRAVENOUS
  Filled 2019-01-26 (×15): qty 1

## 2019-01-26 MED ORDER — HYDROXYZINE HCL 25 MG PO TABS
25.0000 mg | ORAL_TABLET | Freq: Three times a day (TID) | ORAL | Status: DC | PRN
  Administered 2019-02-02: 25 mg via ORAL
  Filled 2019-01-26: qty 1

## 2019-01-26 MED ORDER — BUPIVACAINE HCL (PF) 0.5 % IJ SOLN
10.0000 mL | Freq: Once | INTRAMUSCULAR | Status: AC
  Administered 2019-01-26: 10 mL
  Filled 2019-01-26: qty 10

## 2019-01-26 NOTE — Consult Note (Signed)
Reason for Consult:Left knee effusion Referring Physician: M Kevn Teater is an 68 y.o. male.  HPI: Phillip Maldonado has a hx/o severe knee OA. He was set to have TKA at [facility] but was admitted here with GB disease and developed AKI necessitating HD. Over the last 2-3d he has had increasing knee pain and swelling that is preventing ambulation. He has had arthrocentesis and steroid injection in the past with good results.  Past Medical History:  Diagnosis Date  . Anemia   . Angina   . Anxiety   . Arthritis   . Atrial fibrillation with RVR ([place]) 01/24/2013  . Blood transfusion    last 10' 14- "GI bleed"  . CHF (congestive heart failure) ([place])   . CKD (chronic kidney disease) stage 3, GFR 30-59 ml/min 01/08/2016  . Depression   . Diabetes mellitus ([place]) 09/18/2011  . Diverticulosis   . DJD (degenerative joint disease)   . DM type 2, uncontrolled, with neuropathy ([place]) 12/26/2013  . Edema extremities    bilateral lower extremities-"weepy areas due to fluid retention"  . Fatty liver   . Fundic gland polyps of stomach, benign   . Headache(784.0)   . Hepatitis B   . History of alcohol abuse    last drink in 1993  . Hypertension   . Neuromuscular disorder ([place])   . 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 Obesity   . Obesity, morbid ([place]) 07/30/2015  . Pneumonia    not at present time  . Renal insufficiency   . Shortness of breath   . Sleep apnea, obstructive    does where bipap. 06-04-14 "doesn't use much now, no mask/tubing now.    Past Surgical History:  Procedure Laterality Date  . ABDOMINAL SURGERY     [phone]  . BACK SURGERY     x 8 -,multiple fusions(cervical to lumbar)  . COLONOSCOPY    . COLONOSCOPY WITH PROPOFOL N/A 06/14/2014   Procedure: COLONOSCOPY WITH PROPOFOL;  Surgeon: Gatha Mayer, MD;  Location: [facility] ENDOSCOPY;  Service: Endoscopy;  Laterality: N/A;  . DIAGNOSTIC LAPAROSCOPY    . ESOPHAGOGASTRODUODENOSCOPY N/A 01/03/2013   Procedure:  ESOPHAGOGASTRODUODENOSCOPY (EGD);  Surgeon: Arta Silence, MD;  Location: [facility];  Service: Endoscopy;  Laterality: N/A;  . ESOPHAGOGASTRODUODENOSCOPY N/A 01/03/2013   Procedure: ESOPHAGOGASTRODUODENOSCOPY (EGD);  Surgeon: Arta Silence, MD;  Location: [facility];  Service: Endoscopy;  Laterality: N/A;  . ESOPHAGOGASTRODUODENOSCOPY N/A 01/23/2013   Procedure: ESOPHAGOGASTRODUODENOSCOPY (EGD);  Surgeon: Lear Ng, MD;  Location: [facility] ENDOSCOPY;  Service: Endoscopy;  Laterality: N/A;  . ESOPHAGOGASTRODUODENOSCOPY Left 01/27/2013   Procedure: ESOPHAGOGASTRODUODENOSCOPY (EGD);  Surgeon: Lear Ng, MD;  Location: [facility];  Service: Endoscopy;  Laterality: Left;  . ESOPHAGOGASTRODUODENOSCOPY N/A 06/14/2014   Procedure: ESOPHAGOGASTRODUODENOSCOPY (EGD);  Surgeon: Gatha Mayer, MD;  Location: [facility];  Service: Endoscopy;  Laterality: N/A;  . ESOPHAGOSCOPY N/A 01/01/2013   Procedure: ESOPHAGOSCOPY;  Surgeon: Jeryl Columbia, MD;  Location: [facility];  Service: Endoscopy;  Laterality: N/A;  . GASTRIC BYPASS  1977   Reversed in 1992 and revision 1994  . HYDROCELE EXCISION  1996   x 2   . KNEE SURGERY Left 2004    Family History  Problem Relation Age of Onset  . Heart disease Father   . Skin cancer Father   . COPD Father   . Hypertension Father   . Stroke Father   . Arthritis Mother   . Osteoporosis Mother   . Diabetes Daughter   .  Depression Son   . Heart disease Maternal Uncle   . High blood pressure Sister   . High blood pressure Sister   . High blood pressure Son   . Depression Son   . Arthritis Sister   . Arthritis Sister   . Cancer Maternal Grandmother   . Heart disease Maternal Uncle   . Heart disease Paternal Uncle     Social History:  reports that he has never smoked. He has never used smokeless tobacco. He reports that he does not drink alcohol or use drugs.  Allergies:  Allergies  Allergen Reactions  . Latex Other (See Comments) and Rash    Rash and  itching Other reaction(s): Other (See Comments) Burning Feeling  . Piroxicam Other (See Comments) and Swelling    unknown Other reaction(s): Other (See Comments) unknown Tongue  . Chlorzoxazone     Other reaction(s): Angioedema (ALLERGY/intolerance)  . Adhesive [Tape] Other (See Comments)    Unknown paper tape ok to use     Medications: I have reviewed the patient's current medications.  Results for orders placed or performed during the hospital encounter of 01/20/19 (from the past 48 hour(s))  Glucose, capillary     Status: Abnormal   Collection Time: 01/24/19  4:20 PM  Result Value Ref Range   Glucose-Capillary 213 (H) 70 - 99 mg/dL   Comment 1 Notify RN    Comment 2 Document in Chart   Glucose, capillary     Status: Abnormal   Collection Time: 01/24/19  9:14 PM  Result Value Ref Range   Glucose-Capillary 199 (H) 70 - 99 mg/dL  Troponin I (High Sensitivity)     Status: None   Collection Time: 01/24/19 11:33 PM  Result Value Ref Range   Troponin I (High Sensitivity) 14 <18 ng/L    Comment: (NOTE) Elevated high sensitivity troponin I (hsTnI) values and significant  changes across serial measurements may suggest ACS but many other  chronic and acute conditions are known to elevate hsTnI results.  Refer to the "Links" section for chest pain algorithms and additional  guidance. Performed at [facility], [place] [address], [place], [place] [address]   Magnesium     Status: None   Collection Time: 01/25/19  1:43 AM  Result Value Ref Range   Magnesium 2.0 1.7 - 2.4 mg/dL    Comment: Performed at [facility], [place] [address], [place], [place] [address]  Phosphorus     Status: Abnormal   Collection Time: 01/25/19  1:43 AM  Result Value Ref Range   Phosphorus 4.8 (H) 2.5 - 4.6 mg/dL    Comment: Performed at [facility], [place] [address] [place], [place] [address]  Comprehensive metabolic panel     Status: Abnormal    Collection Time: 01/25/19  1:43 AM  Result Value Ref Range   Sodium 134 (L) 135 - 145 mmol/L   Potassium 4.3 3.5 - 5.1 mmol/L   Chloride 98 98 - 111 mmol/L   CO2 21 (L) 22 - 32 mmol/L   Glucose, Bld 175 (H) 70 - 99 mg/dL   BUN 75 (H) 8 - 23 mg/dL   Creatinine, Ser 8.26 (H) 0.61 - 1.24 mg/dL   Calcium 8.3 (L) 8.9 - 10.3 mg/dL   Total Protein 6.8 6.5 - 8.1 g/dL   Albumin 2.6 (L) 3.5 - 5.0 g/dL   AST 37 15 - 41 U/L   ALT 65 (H) 0 - 44 U/L   Alkaline  Phosphatase 122 38 - 126 U/L   Total Bilirubin 1.2 0.3 - 1.2 mg/dL   GFR calc non Af Amer 6 (L) >60 mL/min   GFR calc Af Amer 7 (L) >60 mL/min   Anion gap 15 5 - 15    Comment: Performed at [facility], [place] [address], [place], [place] [address]  CBC with Differential/Platelet     Status: Abnormal   Collection Time: 01/25/19  1:43 AM  Result Value Ref Range   WBC 11.2 (H) 4.0 - 10.5 K/uL   RBC 4.02 (L) 4.22 - 5.81 MIL/uL   Hemoglobin 11.5 (L) 13.0 - 17.0 g/dL   HCT 36.7 (L) 39.0 - 52.0 %   MCV 91.3 80.0 - 100.0 fL   MCH 28.6 26.0 - 34.0 pg   MCHC 31.3 30.0 - 36.0 g/dL   RDW 16.6 (H) 11.5 - 15.5 %   Platelets 214 150 - 400 K/uL   nRBC 0.0 0.0 - 0.2 %   Neutrophils Relative % 78 %   Neutro Abs 8.7 (H) 1.7 - 7.7 K/uL   Lymphocytes Relative 10 %   Lymphs Abs 1.2 0.7 - 4.0 K/uL   Monocytes Relative 9 %   Monocytes Absolute 1.0 0.1 - 1.0 K/uL   Eosinophils Relative 2 %   Eosinophils Absolute 0.2 0.0 - 0.5 K/uL   Basophils Relative 0 %   Basophils Absolute 0.0 0.0 - 0.1 K/uL   Immature Granulocytes 1 %   Abs Immature Granulocytes 0.07 0.00 - 0.07 K/uL    Comment: Performed at [facility], [place] [address] [place], [place] [address]  Troponin I (High Sensitivity)     Status: None   Collection Time: 01/25/19  1:43 AM  Result Value Ref Range   Troponin I (High Sensitivity) 12 <18 ng/L    Comment: (NOTE) Elevated high sensitivity troponin I (hsTnI) values and significant  changes across serial  measurements may suggest ACS but many other  chronic and acute conditions are known to elevate hsTnI results.  Refer to the "Links" section for chest pain algorithms and additional  guidance. Performed at [facility], [place] [address] [place], [place] [address]   Glucose, capillary     Status: Abnormal   Collection Time: 01/25/19  8:04 AM  Result Value Ref Range   Glucose-Capillary 152 (H) 70 - 99 mg/dL  Glucose, capillary     Status: Abnormal   Collection Time: 01/25/19 12:18 PM  Result Value Ref Range   Glucose-Capillary 234 (H) 70 - 99 mg/dL  Glucose, capillary     Status: Abnormal   Collection Time: 01/25/19  4:44 PM  Result Value Ref Range   Glucose-Capillary 201 (H) 70 - 99 mg/dL  Glucose, capillary     Status: Abnormal   Collection Time: 01/26/19 12:00 AM  Result Value Ref Range   Glucose-Capillary 134 (H) 70 - 99 mg/dL   Comment 1 Document in Chart   Basic metabolic panel     Status: Abnormal   Collection Time: 01/26/19  4:37 AM  Result Value Ref Range   Sodium 136 135 - 145 mmol/L   Potassium 4.3 3.5 - 5.1 mmol/L   Chloride 99 98 - 111 mmol/L   CO2 20 (L) 22 - 32 mmol/L   Glucose, Bld 187 (H) 70 - 99 mg/dL   BUN 54 (H) 8 - 23 mg/dL   Creatinine, Ser 7.31 (H) 0.61 - 1.24 mg/dL   Calcium 8.3 (L) 8.9 - 10.3  mg/dL   GFR calc non Af Amer 7 (L) >60 mL/min   GFR calc Af Amer 8 (L) >60 mL/min   Anion gap 17 (H) 5 - 15    Comment: Performed at [place] [address] [place],  [address]  Glucose, capillary     Status: Abnormal   Collection Time: 01/26/19  8:15 AM  Result Value Ref Range   Glucose-Capillary 159 (H) 70 - 99 mg/dL  Glucose, capillary     Status: Abnormal   Collection Time: 01/26/19 12:23 PM  Result Value Ref Range   Glucose-Capillary 195 (H) 70 - 99 mg/dL    Dg Chest Port 1 View  Result Date: 01/25/2019 CLINICAL DATA:  Central line placement EXAM: PORTABLE CHEST 1 VIEW COMPARISON:  January 25, 2019 FINDINGS: A  new central line is been placed terminating near the caval atrial junction. Platelike opacity in the medial right lung base is probably at least partially explained by atelectasis. There is a left-sided pleural effusion with underlying opacity. No pneumothorax. The cardiomediastinal silhouette is stable. IMPRESSION: 1. The new right central line is in good position.  No pneumothorax. 2. Probable atelectasis in the medial right lung base. 3. Small left pleural effusion with underlying opacity. Recommend follow-up to resolution. Electronically Signed   By: Dorise Bullion III M.D   On: 01/25/2019 16:40   Dg Chest Port 1 View  Result Date: 01/25/2019 CLINICAL DATA:  Shortness of breath. EXAM: PORTABLE CHEST 1 VIEW COMPARISON:  01/24/2019 FINDINGS: Patient's head/chin overlies the medial aspect of the lung apices. Lungs are hypoinflated with persistent linear density right base likely atelectasis. Persistent opacification in the left base without significant change likely small amount of left pleural fluid with atelectasis, although infection is possible. Cardiomediastinal silhouette and remainder of the exam is unchanged. IMPRESSION: Stable left base opacification likely small effusion with atelectasis, although infection is possible. Stable linear density right base likely atelectasis. Electronically Signed   By: Marin Olp M.D.   On: 01/25/2019 07:17    Review of Systems  Constitutional: Negative for chills, fever and weight loss.  HENT: Negative for ear discharge, ear pain, hearing loss and tinnitus.   Eyes: Negative for blurred vision, double vision, photophobia and pain.  Respiratory: Negative for cough, sputum production and shortness of breath.   Cardiovascular: Negative for chest pain.  Gastrointestinal: Negative for abdominal pain, nausea and vomiting.  Genitourinary: Negative for dysuria, flank pain, frequency and urgency.  Musculoskeletal: Positive for joint pain (Left knee). Negative for  back pain, falls, myalgias and neck pain.  Neurological: Negative for dizziness, tingling, sensory change, focal weakness, loss of consciousness and headaches.  Endo/Heme/Allergies: Does not bruise/bleed easily.  Psychiatric/Behavioral: Negative for depression, memory loss and substance abuse. The patient is not nervous/anxious.    Blood pressure 131/63, pulse 87, temperature 98 F (36.7 C), temperature source Oral, resp. rate 18, height 5\' 9"  (1.753 m), weight (!) 165.7 kg, SpO2 90 %. Physical Exam  Constitutional: He appears well-developed and well-nourished. No distress.  HENT:  Head: Normocephalic and atraumatic.  Eyes: Conjunctivae are normal. Right eye exhibits no discharge. Left eye exhibits no discharge. No scleral icterus.  Neck: Normal range of motion.  Cardiovascular: Normal rate and regular rhythm.  Respiratory: Effort normal. No respiratory distress.  Musculoskeletal:     Comments: LLE No traumatic wounds, ecchymosis, or rash  Mod TTP, mod effusion  No ankle effusion  Sens DPN, SPN, TN intact  Motor EHL, ext, flex, evers 5/5  DP 2+, PT 1+, No significant edema  Neurological: He is alert.  Skin: Skin is warm and dry. He is not diaphoretic.  Psychiatric: He has a normal mood and affect. His behavior is normal.    Assessment/Plan: Left knee effusion -- Will aspirate and inject. Will need to f/u with joint surgeon at discharge. Multiple medical problems including gastric bypass in the 70s, repeated, hypertension, diabetes mellitus, A. fib, chronic abdominal pain -- per primary service    Lisette Abu, PA-C Orthopedic Surgery [phone] 01/26/2019, 2:27 PM

## 2019-01-26 NOTE — Progress Notes (Signed)
PROGRESS NOTE    Phillip Maldonado  [name] DOB: Dec 28, 1950 DOA: 01/20/2019 PCP: Patient, No Pcp Per   Brief Narrative:  HPI on 01/20/2019 by Dr. Merton Border Phillip Maldonado  is a 68 y.o. 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 fib, chronic abdominal pain presenting with acute onset of epigastric pain radiating to the right upper chest associated with nausea vomiting and no diarrhea.  The pain was radiating to the right chest and the patient had full work-up in the emergency room including an EKG, troponins CT of abdomen and pelvis with no significant abnormalities noted.  Patient denies any history of blood in his urine or stools.  Patient had subjective fevers early in the morning at 430 when he wake up with the abdominal pain.  Patient is on chronic pain medications but taking MiraLAX as well. Patient denies any cough or shortness of breath. Patient was recently treated for upper GI bleed and case discussed with Dr. Michail Sermon from GI. Work-up in the emergency room showed hyperglycemia otherwise most of his blood work is full and within the normal range when showed colonic diverticulosis  Interim History Patient mated for gallstone pancreatitis.  However he was noted to have septic shock on 01/21/2019 and was transferred to the ICU.  Patient was eventually weaned off of pressors and transferred back to the floor.  He did have MRCP which showed acute pancreatitis, cholelithiasis, no CBD obstruction.  Patient suspected to have gallstone pancreatitis.  General surgery consulted and appreciated, however patient not medically stable for surgical intervention at this time.  Patient was also noted to have worsening creatinine, and nephrology has been seeing him.  Patient needing temporary hemodialysis.  He was then transferred to [facility]. Assessment & Plan   Septic shock likely secondary to acute cholecystitis/ Acute  gallstone pancreatitis -Was noted to be mildly tachycardic and tachypneic on admission, however not febrile, without leukocytosis, and blood pressure was normal at that time -However over the next 24 hours, patient did develop leukocytosis of 22, became hypotensive and required ICU admission for pressors -Patient was also noted to have an elevated lipase as well as LFTs, however these did improve as hospitalization continued. -MRCP showed mild peripancreatic fluid along with pancreatic head/uncinate process likely related to acute pancreatitis.  No fluid collection or pseudocyst.  Cholelithiasis. -HIDA scan was ordered, however refused by patient -Gastroenterology consulted and appreciated, recommended follow-up with Dr. Arelia Longest in 2 to 4 weeks -General surgery was consulted and appreciated, patient was not a surgical candidate at this time given his medical issues.  May consider surgery next week versus outpatient. -Continue ceftriaxone and Flagyl -Blood cultures 01/21/2019: Aerobic bottle only with gram-negative rods  Acute kidney injury -Upon presentation, patient's creatinine was normal -However he developed AKI in the setting of septic shock.  Despite IV hydration, creatinine peaked to 8.26 -Renal ultrasound showed no obstruction -Urine culture showed no growth -Nephrology consulted and appreciated -PCCM placed central venous catheter -Patient underwent hemodialysis  Acute respiratory failure with hypoxia -History of COPD, heart failure, OSA, however not on oxygen supplementation at home -Patient was requiring supplemental oxygen however has now been weaned off, and currently on room air maintaining oxygen saturations in the 90s  Essential hypertension -BP meds currently held  Hyperlipidemia -Statin on hold given elevated LFTs  Acute diastolic CHF -Patient does appear to be mildly overloaded, suspect secondary to IV fluids given for acute kidney injury and sepsis -  Echocardiogram  on 01/22/2019 showed an EF of [id number], grade 1 diastolic dysfunction -Suspect fluid status will improve with temporary hemodialysis  Diabetes mellitus, type II -Metformin only held -Hemoglobin A1c 8.6  GERD/history of GI bleed -Continue PPI  Morbid obesity/sleep apnea -BMI 53.95 -Patient to follow-up with his PCP to discuss lifestyle modifications -patient does not use CPAP at home  Left knee pain -Patient was supposed to have some type of procedure few months ago however this was delayed as his wife had Covid. -Orthopedics consulted for possible steroid injection.  DVT Prophylaxis Heparin  Code Status: Full  Family Communication: None at bedside  Disposition Plan: Admitted.  Currently pending improvement in renal function as well as possible surgical intervention this admission.  Disposition to be determined.  Consultants PCCM General surgery Nephrology Gastroenterology Orthopedic surgery  Procedures  Echocardiogram Insertion of central venous catheter Renal ultrasound MRCP  Antibiotics   Anti-infectives (From admission, onward)   Start     Dose/Rate Route Frequency Ordered Stop   01/23/19 1400  piperacillin-tazobactam (ZOSYN) IVPB 2.25 g  Status:  Discontinued     2.25 g 100 mL/hr over 30 Minutes Intravenous Every 8 hours 01/23/19 0717 01/23/19 0947   01/23/19 1400  cefTRIAXone (ROCEPHIN) 2 g in sodium chloride 0.9 % 100 mL IVPB     2 g 200 mL/hr over 30 Minutes Intravenous Every 24 hours 01/23/19 0947     01/23/19 1400  metroNIDAZOLE (FLAGYL) IVPB 500 mg     500 mg 100 mL/hr over 60 Minutes Intravenous Every 8 hours 01/23/19 0947     01/21/19 1400  piperacillin-tazobactam (ZOSYN) IVPB 3.375 g  Status:  Discontinued     3.375 g 12.5 mL/hr over 240 Minutes Intravenous Every 8 hours 01/21/19 1325 01/23/19 [id number]      Subjective:   Phillip Maldonado seen and examined today.  Patient wishes he could eat something.  Denies current nausea or vomiting, abdominal pain,  dizziness or headache, shortness of breath, chest pain.  Does complain of left knee pain and states he was supposed to have surgery on this.  Does feel this is joints and extremities are swollen.  Objective:   Vitals:   01/25/19 2333 01/26/19 0001 01/26/19 0500 01/26/19 0526  BP: 110/84 (!) 141/75  131/63  Pulse: 100 98  87  Resp: 18     Temp: 98.3 F (36.8 C) 97.6 F (36.4 C)  98 F (36.7 C)  TempSrc: Oral Oral  Oral  SpO2: 97% 93%  90%  Weight: (!) 166 kg  (!) 165.7 kg   Height:        Intake/Output Summary (Last 24 hours) at 01/26/2019 1157 Last data filed at 01/26/2019 [id number] Gross per 24 hour  Intake 320 ml  Output 2550 ml  Net -2230 ml   Filed Weights   01/25/19 2050 01/25/19 2333 01/26/19 0500  Weight: (!) 168 kg (!) 166 kg (!) 165.7 kg    Exam  General: Well developed, well nourished, NAD, appears stated age  50: NCAT, mucous membranes moist.   Neck: Supple, mild JVD (however difficult to see given body habitus)  Cardiovascular: S1 S2 auscultated, RRR, no murmur  Respiratory: Diminished however clear breath sounds, no wheezing  Abdomen: Soft, obese, nontender, nondistended, + bowel sounds  Extremities: warm dry without cyanosis clubbing. Mild UE/LE edema. TTP of left knee  Neuro: AAOx3, nonfocal  Psych: Pleasant, appropriate mood and affect   Data Reviewed: I have personally reviewed following labs and imaging studies  CBC:  Recent Labs  Lab 01/21/19 1817 01/22/19 0102 01/23/19 0340 01/24/19 0150 01/25/19 0143  WBC 18.1* 20.5* 14.1* 11.6* 11.2*  NEUTROABS 15.0* 16.7* 11.8* 9.1* 8.7*  HGB 13.4 12.3* 11.2* 10.9* 11.5*  HCT 43.1 38.1* 36.2* 34.8* 36.7*  MCV 90.4 89.4 92.3 90.4 91.3  PLT 239 239 178 197 [id number]   Basic Metabolic Panel: Recent Labs  Lab 01/21/19 1817 01/22/19 0102 01/23/19 0340 01/24/19 0150 01/25/19 0143 01/26/19 0437  NA 136 133* 135 134* 134* 136  K 4.1 3.6 4.0 4.0 4.3 4.3  CL 95* 97* 99 97* 98 99  CO2 24 24 25 22  21*  20*  GLUCOSE 265* 212* 152* 161* 175* 187*  BUN 27* 33* 52* 63* 75* 54*  CREATININE 2.25* 2.99* 5.25* 6.69* 8.26* 7.31*  CALCIUM 8.6* 7.8* 8.0* 8.1* 8.3* 8.3*  MG 1.7 1.7 1.8 1.9 2.0  --   PHOS 2.4* 3.1 3.7 4.0 4.8*  --    GFR: Estimated Creatinine Clearance: 15.1 mL/min (A) (by C-G formula based on SCr of 7.31 mg/dL (H)). Liver Function Tests: Recent Labs  Lab 01/21/19 1817 01/22/19 0102 01/23/19 0340 01/24/19 0150 01/25/19 0143  AST 449* 277* 102* 54* 37  ALT 298* 230* 130* 87* 65*  ALKPHOS 116 102 92 107 122  BILITOT 5.5* 5.3* 2.3* 1.3* 1.2  PROT 7.1 6.6 6.3* 6.4* 6.8  ALBUMIN 3.4* 3.1* 2.6* 2.4* 2.6*   Recent Labs  Lab 01/20/19 1114 01/21/19 1332 01/21/19 1900 01/22/19 0822 01/23/19 0500 01/24/19 0150  LIPASE 31 1,054*  --  219* 79* 38  AMYLASE  --   --  509*  --   --   --    No results for input(s): AMMONIA in the last 168 hours. Coagulation Profile: Recent Labs  Lab 01/20/19 1114 01/21/19 2305  INR 1.0 1.4*   Cardiac Enzymes: Recent Labs  Lab 01/21/19 2305  CKTOTAL 115   BNP (last 3 results) No results for input(s): PROBNP in the last 8760 hours. HbA1C: No results for input(s): HGBA1C in the last 72 hours. CBG: Recent Labs  Lab 01/25/19 0804 01/25/19 1218 01/25/19 1644 01/26/19 0000 01/26/19 0815  GLUCAP 152* 234* 201* 134* 159*   Lipid Profile: No results for input(s): CHOL, HDL, LDLCALC, TRIG, CHOLHDL, LDLDIRECT in the last 72 hours. Thyroid Function Tests: No results for input(s): TSH, T4TOTAL, FREET4, T3FREE, THYROIDAB in the last 72 hours. Anemia Panel: No results for input(s): VITAMINB12, FOLATE, FERRITIN, TIBC, IRON, RETICCTPCT in the last 72 hours. Urine analysis:    Component Value Date/Time   COLORURINE RED (A) 01/23/2019 1402   APPEARANCEUR TURBID (A) 01/23/2019 1402   LABSPEC  01/23/2019 1402    TEST NOT REPORTED DUE TO COLOR INTERFERENCE OF URINE PIGMENT   PHURINE  01/23/2019 1402    TEST NOT REPORTED DUE TO COLOR  INTERFERENCE OF URINE PIGMENT   GLUCOSEU (A) 01/23/2019 1402    TEST NOT REPORTED DUE TO COLOR INTERFERENCE OF URINE PIGMENT   HGBUR (A) 01/23/2019 1402    TEST NOT REPORTED DUE TO COLOR INTERFERENCE OF URINE PIGMENT   BILIRUBINUR (A) 01/23/2019 1402    TEST NOT REPORTED DUE TO COLOR INTERFERENCE OF URINE PIGMENT   BILIRUBINUR Large 12/30/2015 1600   KETONESUR (A) 01/23/2019 1402    TEST NOT REPORTED DUE TO COLOR INTERFERENCE OF URINE PIGMENT   PROTEINUR (A) 01/23/2019 1402    TEST NOT REPORTED DUE TO COLOR INTERFERENCE OF URINE PIGMENT   UROBILINOGEN 0.2 12/30/2015 1600   UROBILINOGEN 1.0 09/15/2013  1855   NITRITE (A) 01/23/2019 1402    TEST NOT REPORTED DUE TO COLOR INTERFERENCE OF URINE PIGMENT   LEUKOCYTESUR (A) 01/23/2019 1402    TEST NOT REPORTED DUE TO COLOR INTERFERENCE OF URINE PIGMENT   Sepsis Labs: @LABRCNTIP (procalcitonin:4,lacticidven:4)  ) Recent Results (from the past 240 hour(s))  SARS CORONAVIRUS 2 (TAT 6-24 HRS) Nasopharyngeal Nasopharyngeal Swab     Status: None   Collection Time: 01/20/19  6:20 PM   Specimen: Nasopharyngeal Swab  Result Value Ref Range Status   SARS Coronavirus 2 NEGATIVE NEGATIVE Final    Comment: (NOTE) SARS-CoV-2 target nucleic acids are NOT DETECTED. The SARS-CoV-2 RNA is generally detectable in upper and lower respiratory specimens during the acute phase of infection. Negative results do not preclude SARS-CoV-2 infection, do not rule out co-infections with other pathogens, and should not be used as the sole basis for treatment or other patient management decisions. Negative results must be combined with clinical observations, patient history, and epidemiological information. The expected result is Negative. Fact Sheet for Patients: SugarRoll.be Fact Sheet for Healthcare Providers: https://www.woods-mathews.com/ This test is not yet approved or cleared by the [place] FDA and  has been  authorized for detection and/or diagnosis of SARS-CoV-2 by FDA under an Emergency Use Authorization (EUA). This EUA will remain  in effect (meaning this test can be used) for the duration of the COVID-19 declaration under Section 56 4(b)(1) of the Act, 21 U.S.C. section 360bbb-3(b)(1), unless the authorization is terminated or revoked sooner. Performed at [facility], [place] [address] [place], [place] [address]   Culture, Urine     Status: Abnormal   Collection Time: 01/21/19  1:03 PM   Specimen: Urine, Random  Result Value Ref Range Status   Specimen Description   Final    URINE, RANDOM Performed at [place] [address], [place], [place] [address]    Special Requests   Final    NONE Performed at [facility], [place] [address], [place], [place] [address]    Culture (A)  Final    40,000 COLONIES/mL MULTIPLE SPECIES PRESENT, SUGGEST RECOLLECTION   Report Status 01/23/2019 FINAL  Final  Culture, blood (routine x 2)     Status: None   Collection Time: 01/21/19  1:32 PM   Specimen: BLOOD  Result Value Ref Range Status   Specimen Description   Final    BLOOD RIGHT ANTECUBITAL Performed at [facility], [place] [address], [place], [place] [address]    Special Requests   Final    BOTTLES DRAWN AEROBIC AND ANAEROBIC Blood Culture adequate volume Performed at [place] [address], [place], [place] [address]    Culture   Final    NO GROWTH 5 DAYS Performed at [facility], [place] [address], [place], [place] [address]    Report Status 01/26/2019 FINAL  Final  Culture, blood (routine x 2)     Status: None (Preliminary result)   Collection Time: 01/21/19  1:33 PM   Specimen: BLOOD  Result Value Ref Range Status   Specimen Description   Final    BLOOD RIGHT HAND Performed at [place] [address] [place], [place] [address]    Special Requests   Final    BOTTLES DRAWN  AEROBIC AND ANAEROBIC Blood Culture adequate volume Performed at [place] [address], [place],  [address]    Culture  Setup Time  Final    ANAEROBIC BOTTLE ONLY GRAM NEGATIVE RODS CRITICAL RESULT CALLED TO, READ BACK BY AND VERIFIED WITH: Irwin Brakeman [id number] 01/24/2019 T. TYSOR    Culture   Final    CULTURE REINCUBATED FOR BETTER GROWTH Performed at [facility], [place] [address] [place], [place] [address]    Report Status PENDING  Incomplete  Blood Culture ID Panel (Reflexed)     Status: None   Collection Time: 01/21/19  1:33 PM  Result Value Ref Range Status   Enterococcus species NOT DETECTED NOT DETECTED Final   Listeria monocytogenes NOT DETECTED NOT DETECTED Final   Staphylococcus species NOT DETECTED NOT DETECTED Final   Staphylococcus aureus (BCID) NOT DETECTED NOT DETECTED Final   Streptococcus species NOT DETECTED NOT DETECTED Final   Streptococcus agalactiae NOT DETECTED NOT DETECTED Final   Streptococcus pneumoniae NOT DETECTED NOT DETECTED Final   Streptococcus pyogenes NOT DETECTED NOT DETECTED Final   Acinetobacter baumannii NOT DETECTED NOT DETECTED Final   Enterobacteriaceae species NOT DETECTED NOT DETECTED Final   Enterobacter cloacae complex NOT DETECTED NOT DETECTED Final   Escherichia coli NOT DETECTED NOT DETECTED Final   Klebsiella oxytoca NOT DETECTED NOT DETECTED Final   Klebsiella pneumoniae NOT DETECTED NOT DETECTED Final   Proteus species NOT DETECTED NOT DETECTED Final   Serratia marcescens NOT DETECTED NOT DETECTED Final   Haemophilus influenzae NOT DETECTED NOT DETECTED Final   Neisseria meningitidis NOT DETECTED NOT DETECTED Final   Pseudomonas aeruginosa NOT DETECTED NOT DETECTED Final   Candida albicans NOT DETECTED NOT DETECTED Final   Candida glabrata NOT DETECTED NOT DETECTED Final   Candida krusei NOT DETECTED NOT DETECTED Final   Candida parapsilosis NOT DETECTED NOT DETECTED Final   Candida  tropicalis NOT DETECTED NOT DETECTED Final    Comment: Performed at [facility], [place] [address], [place], [place] [address]  MRSA PCR Screening     Status: None   Collection Time: 01/21/19  7:28 PM   Specimen: Nasal Mucosa; Nasopharyngeal  Result Value Ref Range Status   MRSA by PCR NEGATIVE NEGATIVE Final    Comment:        The GeneXpert MRSA Assay (FDA approved for NASAL specimens only), is one component of a comprehensive MRSA colonization surveillance program. It is not intended to diagnose MRSA infection nor to guide or monitor treatment for MRSA infections. Performed at [facility], [place] [address] [place], [place], [place] [address]   Culture, Urine     Status: None   Collection Time: 01/23/19  2:02 PM   Specimen: Urine, Random  Result Value Ref Range Status   Specimen Description   Final    URINE, RANDOM Performed at [place] [address] [place], [place] [address]    Special Requests   Final    NONE Performed at [facility], [place] [address] [place], [place] [address]    Culture   Final    NO GROWTH Performed at [facility], [place] [address], [place], [place] [address]    Report Status 01/24/2019 FINAL  Final      Radiology Studies: Dg Chest Port 1 View  Result Date: 01/25/2019 CLINICAL DATA:  Central line placement EXAM: PORTABLE CHEST 1 VIEW COMPARISON:  January 25, 2019 FINDINGS: A new central line is been placed terminating near the caval atrial junction. Platelike opacity in the medial right lung base is probably at least partially explained by atelectasis. There is  a left-sided pleural effusion with underlying opacity. No pneumothorax. The cardiomediastinal silhouette is stable. IMPRESSION: 1. The new right central line is in good position.  No pneumothorax. 2. Probable atelectasis in the medial right lung base. 3. Small left pleural effusion with underlying opacity. Recommend follow-up to  resolution. Electronically Signed   By: Dorise Bullion III M.D   On: 01/25/2019 16:40   Dg Chest Port 1 View  Result Date: 01/25/2019 CLINICAL DATA:  Shortness of breath. EXAM: PORTABLE CHEST 1 VIEW COMPARISON:  01/24/2019 FINDINGS: Patient's head/chin overlies the medial aspect of the lung apices. Lungs are hypoinflated with persistent linear density right base likely atelectasis. Persistent opacification in the left base without significant change likely small amount of left pleural fluid with atelectasis, although infection is possible. Cardiomediastinal silhouette and remainder of the exam is unchanged. IMPRESSION: Stable left base opacification likely small effusion with atelectasis, although infection is possible. Stable linear density right base likely atelectasis. Electronically Signed   By: Marin Olp M.D.   On: 01/25/2019 07:17     Scheduled Meds: . bupivacaine  10 mL Infiltration Once  . Chlorhexidine Gluconate Cloth  6 each Topical Daily  . Chlorhexidine Gluconate Cloth  6 each Topical Q0600  . DULoxetine  60 mg Oral Daily  . heparin injection (subcutaneous)  5,000 Units Subcutaneous Q8H  . insulin aspart  0-5 Units Subcutaneous QHS  . insulin aspart  0-9 Units Subcutaneous TID WC  . insulin glargine  20 Units Subcutaneous Q2200  . mouth rinse  15 mL Mouth Rinse BID  . methylPREDNISolone acetate  80 mg Intra-articular Once  . pantoprazole (PROTONIX) IV  40 mg Intravenous Q12H  . simethicone  80 mg Oral QID  . sodium chloride flush  10-40 mL Intracatheter Q12H  . thiamine injection  100 mg Intravenous Daily  . traZODone  300 mg Oral QHS   Continuous Infusions: . cefTRIAXone (ROCEPHIN)  IV Stopped (01/25/19 1404)  . metronidazole 500 mg (01/26/19 0926)     LOS: 6 days   Time Spent in minutes   45 minutes (greater than 50% of time spent with patient face to face, as well as reviewing records, calling consults, and formulating a plan)   Cristal Ford D.O. on  01/26/2019 at 11:57 AM  Between 7am to 7pm - Please see pager noted on [email or web address]  After 7pm go to [email or web address]  And look for the night coverage person covering for me after hours  [facility] Office  [phone]

## 2019-01-26 NOTE — Procedures (Signed)
Procedure: Left knee aspiration and injection  Indication: Left knee effusion(s)  Surgeon: Silvestre Gunner, PA-C  Assist: None  Anesthesia: Topical refirgerant  EBL: None  Complications: None  Findings: After risks/benefits explained patient desires to undergo procedure. Consent obtained and time out performed. The left knee was sterilely prepped and aspirated. 123ml clear yellow fluid obtained. 29ml 0.5% Marcaine and 80mg  depo-medrol instilled. Pt tolerated the procedure well.    Lisette Abu, PA-C Orthopedic Surgery [phone]

## 2019-01-26 NOTE — Progress Notes (Signed)
[facility] Progress Note  Subjective: had 2.5 hr HD last night w 2L off, 500 cc UOP yest. Today is groggy after pain meds, but Ox 3. creaT 7.8  Vitals:   01/25/19 2333 01/26/19 0001 01/26/19 0500 01/26/19 0526  BP: 110/84 (!) 141/75  131/63  Pulse: 100 98  87  Resp: 18     Temp: 98.3 F (36.8 C) 97.6 F (36.4 C)  98 F (36.7 C)  TempSrc: Oral Oral  Oral  SpO2: 97% 93%  90%  Weight: (!) 166 kg  (!) 165.7 kg   Height:        Inpatient medications: . Chlorhexidine Gluconate Cloth  6 each Topical Daily  . Chlorhexidine Gluconate Cloth  6 each Topical Q0600  . [START ON 01/27/2019] Chlorhexidine Gluconate Cloth  6 each Topical Q0600  . DULoxetine  60 mg Oral Daily  . heparin injection (subcutaneous)  5,000 Units Subcutaneous Q8H  . insulin aspart  0-5 Units Subcutaneous QHS  . insulin aspart  0-9 Units Subcutaneous TID WC  . insulin glargine  20 Units Subcutaneous Q2200  . mouth rinse  15 mL Mouth Rinse BID  . pantoprazole (PROTONIX) IV  40 mg Intravenous Q12H  . simethicone  80 mg Oral QID  . sodium chloride flush  10-40 mL Intracatheter Q12H  . thiamine injection  100 mg Intravenous Daily  . traZODone  300 mg Oral QHS   . cefTRIAXone (ROCEPHIN)  IV 2 g (01/26/19 1254)  . metronidazole 500 mg (01/26/19 0926)   bisacodyl, HYDROmorphone (DILAUDID) injection, hydrOXYzine, levalbuterol, ondansetron **OR** ondansetron (ZOFRAN) IV, polyethylene glycol, sodium chloride flush    Exam: Gen alert, on distress No rash, cyanosis or gangrene Sclera anicteric, throat clear  +JVD Chest mild rales bibasilar RRR no MRG Abd soft ntnd no mass or ascites +bs GU normal male w foley in place defe MS no joint effusions or deformity Ext mild bilat UE edema, no LE edema Neuro is alert, Ox 3 , nf       Home meds:   - metoprolol 50 bid/ metolazone 2.5 qd/ lisinopril 40 qd/ torsemide 100 bid/ amlodipine 10/ losartan 100/ amlodipine  - methadone 10 tid prn/ naloxegol  oxalate 25 qd  - insulin glargine 20 hs/ ? Metformin   - gabapentin 300 tid prn/ duloxetine 60 bid/ trazodone 300 hs/ ? lyrica  - pantoprazole 40 qd/ atorvastatin 40   UA turbid, red, >50 rbc and 11-20 wbc   UN 65  UCr 114  Renal US > IMPRESSION:    Limited visualization of the kidneys due to body habitus. No obstruction. Normal renal size. Urinary bladder empty with Foley catheter.  Assessment/ Plan: 1. Acute renal failure - combination of IV contrast/ hypotension-shock w/  ARB/ ACEi on board. Creat worsening, sig vol excess on exam.  I feel will need temporary dialysis. Got HD last night for worsening azotemia and vol overload. Stable today, UOP okay, not great. Will plan next HD tomorrow am, UF 2-3 L as tol.  2. Septic shock/ abd pain - resolved, suspected gallstone pancreatitis 3. HTN -long hx 4. Chronic pain 5. Depression 6. Chronic CHF - no signs pulm edema, lungs are clear by CXR 7. Vol excess - up 13kg from admit      Phillip Maldonado 01/26/2019, 2:42 PM  Iron/TIBC/Ferritin/ %Sat    Component Value Date/Time   IRON 35 (L) 09/16/2013 0243   TIBC 397 09/16/2013 0243   FERRITIN 11 (L) 09/16/2013 0243   IRONPCTSAT 9 (  L) 09/16/2013 0243   Recent Labs  Lab 01/21/19 2305  01/25/19 0143 01/26/19 0437  NA  --    < > 134* 136  K  --    < > 4.3 4.3  CL  --    < > 98 99  CO2  --    < > 21* 20*  GLUCOSE  --    < > 175* 187*  BUN  --    < > 75* 54*  CREATININE  --    < > 8.26* 7.31*  CALCIUM  --    < > 8.3* 8.3*  PHOS  --    < > 4.8*  --   ALBUMIN  --    < > 2.6*  --   INR 1.4*  --   --   --    < > = values in this interval not displayed.   Recent Labs  Lab 01/25/19 0143  AST 37  ALT 65*  ALKPHOS 122  BILITOT 1.2  PROT 6.8   Recent Labs  Lab 01/25/19 0143  WBC 11.2*  HGB 11.5*  HCT 36.7*  PLT 214

## 2019-01-26 NOTE — Care Management Important Message (Signed)
Important Message  Patient Details  Name: Phillip Maldonado MRN: [id number] Date of Birth: 1950-08-29   Medicare Important Message Given:  Yes     Orbie Pyo 01/26/2019, 3:42 PM

## 2019-01-26 NOTE — Progress Notes (Signed)
Pt arrived back to 5w30 from dialysis. Will continue to monitor pt. Ranelle Oyster, RN

## 2019-01-26 NOTE — Progress Notes (Signed)
Noted of patient transfer to [facility]. Continue pancreatitis management per primary. No plans for cholecystectomy this week. Will follow up next week to discuss cholecystectomy this admission (once pancreatitis resolved) versus outpatient surgery.

## 2019-01-26 NOTE — Progress Notes (Signed)
Chaplain is aware of need for AD. Notary is out today. Chaplain will revisit this task Friday November 13th 2020. Chaplain remains available as needs arise.   Chaplain Resident, Evelene Croon, M Div

## 2019-01-27 ENCOUNTER — Encounter (HOSPITAL_COMMUNITY): Payer: Self-pay | Admitting: Nephrology

## 2019-01-27 DIAGNOSIS — M25562 Pain in left knee: Secondary | ICD-10-CM

## 2019-01-27 DIAGNOSIS — M109 Gout, unspecified: Secondary | ICD-10-CM

## 2019-01-27 LAB — COMPREHENSIVE METABOLIC PANEL
ALT: 32 U/L (ref 0–44)
AST: 20 U/L (ref 15–41)
Albumin: 2 g/dL — ABNORMAL LOW (ref 3.5–5.0)
Alkaline Phosphatase: 115 U/L (ref 38–126)
Anion gap: 16 — ABNORMAL HIGH (ref 5–15)
BUN: 70 mg/dL — ABNORMAL HIGH (ref 8–23)
CO2: 19 mmol/L — ABNORMAL LOW (ref 22–32)
Calcium: 8.3 mg/dL — ABNORMAL LOW (ref 8.9–10.3)
Chloride: 98 mmol/L (ref 98–111)
Creatinine, Ser: 8.4 mg/dL — ABNORMAL HIGH (ref 0.61–1.24)
GFR calc Af Amer: 7 mL/min — ABNORMAL LOW (ref >60)
GFR calc non Af Amer: 6 mL/min — ABNORMAL LOW (ref >60)
Glucose, Bld: 194 mg/dL — ABNORMAL HIGH (ref 70–99)
Potassium: 4.6 mmol/L (ref 3.5–5.1)
Sodium: 133 mmol/L — ABNORMAL LOW (ref 135–145)
Total Bilirubin: 1.2 mg/dL (ref 0.3–1.2)
Total Protein: 6.8 g/dL (ref 6.5–8.1)

## 2019-01-27 LAB — CBC
HCT: 35 % — ABNORMAL LOW (ref 39.0–52.0)
Hemoglobin: 11.2 g/dL — ABNORMAL LOW (ref 13.0–17.0)
MCH: 28.4 pg (ref 26.0–34.0)
MCHC: 32 g/dL (ref 30.0–36.0)
MCV: 88.8 fL (ref 80.0–100.0)
Platelets: 271 10*3/uL (ref 150–400)
RBC: 3.94 MIL/uL — ABNORMAL LOW (ref 4.22–5.81)
RDW: 16.6 % — ABNORMAL HIGH (ref 11.5–15.5)
WBC: 11.2 10*3/uL — ABNORMAL HIGH (ref 4.0–10.5)
nRBC: 0 % (ref 0.0–0.2)

## 2019-01-27 LAB — HEPATITIS C ANTIBODY: HCV Ab: NONREACTIVE

## 2019-01-27 LAB — GLUCOSE, CAPILLARY
Glucose-Capillary: 170 mg/dL — ABNORMAL HIGH (ref 70–99)
Glucose-Capillary: 152 mg/dL — ABNORMAL HIGH (ref 70–99)
Glucose-Capillary: 227 mg/dL — ABNORMAL HIGH (ref 70–99)
Glucose-Capillary: 193 mg/dL — ABNORMAL HIGH (ref 70–99)

## 2019-01-27 LAB — HEPATITIS B SURFACE ANTIGEN: Hepatitis B Surface Ag: NONREACTIVE

## 2019-01-27 LAB — HEPATITIS B CORE ANTIBODY, TOTAL: Hep B Core Total Ab: NONREACTIVE

## 2019-01-27 MED ORDER — HYDROMORPHONE HCL 1 MG/ML IJ SOLN
INTRAMUSCULAR | Status: AC
  Filled 2019-01-27: qty 1

## 2019-01-27 MED ORDER — LABETALOL HCL 5 MG/ML IV SOLN
10.0000 mg | Freq: Once | INTRAVENOUS | Status: AC
  Administered 2019-01-27: 10 mg via INTRAVENOUS
  Filled 2019-01-27: qty 4

## 2019-01-27 MED ORDER — HEPARIN SODIUM (PORCINE) 1000 UNIT/ML DIALYSIS
2000.0000 [IU] | Freq: Once | INTRAMUSCULAR | Status: AC
  Administered 2019-01-27: 2000 [IU] via INTRAVENOUS_CENTRAL

## 2019-01-27 MED ORDER — HEPARIN SODIUM (PORCINE) 1000 UNIT/ML DIALYSIS
3000.0000 [IU] | INTRAMUSCULAR | Status: DC | PRN
  Administered 2019-02-01: 17:00:00 2400 [IU] via INTRAVENOUS_CENTRAL
  Filled 2019-01-27: qty 3

## 2019-01-27 MED ORDER — HEPARIN SODIUM (PORCINE) 1000 UNIT/ML IJ SOLN
INTRAMUSCULAR | Status: AC
  Filled 2019-01-27: qty 2

## 2019-01-27 NOTE — Progress Notes (Signed)
Chaplain called RN to see if AD paperwork for Phillip Maldonado had been filled out. RN informed Chaplain that Phillip Maldonado would be in HD all day today. Chaplain informed nurse that notary will be back in the office on Monday Nov. 16th and Chaplain will again attempt to complete and notarize Brad's AD at that time. Chaplain remains available for support as needs arise.   Chaplain Resident, Evelene Croon, M Div

## 2019-01-27 NOTE — Progress Notes (Signed)
Phillip Maldonado, Phillip Maldonado   Brief Narrative:  HPI on 01/20/2019 by Dr. Merton Border Brantly Freer  is a 68 y.o. 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 fib, chronic abdominal pain presenting with acute onset of epigastric pain radiating to the right upper chest associated with nausea vomiting and Phillip diarrhea.  The pain was radiating to the right chest and the Maldonado had full work-up in the emergency room including an EKG, troponins CT of abdomen and pelvis with Phillip significant abnormalities noted.  Maldonado denies any history of blood in his urine or stools.  Maldonado had subjective fevers early in the morning at 430 when he wake up with the abdominal pain.  Maldonado is on chronic pain medications but taking MiraLAX as well. Maldonado denies any cough or shortness of breath. Maldonado was recently treated for upper GI bleed and case discussed with Dr. Michail Sermon from GI. Work-up in the emergency room showed hyperglycemia otherwise most of his blood work is full and within the normal range when showed colonic diverticulosis  Interim History Maldonado mated for gallstone pancreatitis.  However he was noted to have septic shock on 01/21/2019 and was transferred to the ICU.  Maldonado was eventually weaned off of pressors and transferred back to the floor.  He did have MRCP which showed acute pancreatitis, cholelithiasis, Phillip CBD obstruction.  Maldonado suspected to have gallstone pancreatitis.  General surgery consulted and appreciated, however Maldonado not medically stable for surgical intervention at this time.  Maldonado was also noted to have worsening creatinine, and nephrology has been seeing him.  Maldonado needing temporary hemodialysis.  He was then transferred to [facility]. Assessment & Plan   Septic shock likely secondary to acute cholecystitis/ Acute  gallstone pancreatitis -Was noted to be mildly tachycardic and tachypneic on admission, however not febrile, without leukocytosis, and blood pressure was normal at that time -However over the next 24 hours, Maldonado did develop leukocytosis of 22, became hypotensive and required ICU admission for pressors -Maldonado was also noted to have an elevated lipase as well as LFTs, however these did improve as hospitalization continued. -MRCP showed mild peripancreatic fluid along with pancreatic head/uncinate process likely related to acute pancreatitis.  Phillip fluid collection or pseudocyst.  Cholelithiasis. -HIDA scan was ordered, however refused by Maldonado -Gastroenterology consulted and appreciated, recommended follow-up with Dr. Arelia Longest in 2 to 4 weeks -General surgery was consulted and appreciated, Maldonado was not a surgical candidate at this time given his medical issues.  May consider surgery next week versus outpatient. -Continue ceftriaxone and Flagyl -Blood cultures 01/21/2019: anaerobic bottle only with gram-negative rods- bacteroides vulgatus -placed Maldonado on clear liquids and tolerated well, will advance him to full liquids  Acute kidney injury -Upon presentation, Maldonado's creatinine was normal -However he developed AKI in the setting of septic shock.  Despite IV hydration, creatinine peaked to 8.26. Today creatinine 8.4 -Renal ultrasound showed Phillip obstruction -Urine culture showed Phillip growth -Nephrology consulted and appreciated -PCCM placed central venous catheter -Maldonado dialyzing today  Acute respiratory failure with hypoxia -History of COPD, heart failure, OSA, however not on oxygen supplementation at home -Maldonado was requiring supplemental oxygen however has now been weaned off, and currently on room air maintaining oxygen saturations in the 90s  Essential hypertension -BP meds currently held  Hyperlipidemia -Statin on hold given elevated LFTs  Acute diastolic CHF -  Maldonado  does appear to be mildly overloaded, suspect secondary to IV fluids given for acute kidney injury and sepsis -Echocardiogram on 01/22/2019 showed an EF of [id number], grade 1 diastolic dysfunction -Suspect fluid status will improve with temporary hemodialysis  Diabetes mellitus, type II -Metformin held -Continue ISS and lantus with CBG monitoring  -Hemoglobin A1c 8.6  GERD/history of GI bleed -Continue PPI  Morbid obesity/sleep apnea -BMI 53.95 -Maldonado to follow-up with his PCP to discuss lifestyle modifications -Maldonado does not use CPAP at home  Left knee pain -Maldonado was supposed to have some type of procedure few months ago however this was delayed as his wife had Covid. -Orthopedics consulted and appreciated, status post left knee aspiration and injection (under 90 mL of clear fluid was obtained, 6 mL of 0.5% Marcaine and 80 mg of Depo-Medrol injected)  DVT Prophylaxis Heparin  Code Status: Full  Family Communication: None at bedside  Disposition Plan: Admitted.  Currently pending improvement in renal function as well as possible surgical intervention this admission.  Disposition to be determined.  Consultants PCCM General surgery Nephrology Gastroenterology Orthopedic surgery  Procedures  Echocardiogram Insertion of central venous catheter Renal ultrasound MRCP Left knee aspiration and injection  Antibiotics   Anti-infectives (From admission, onward)   Start     Dose/Rate Route Frequency Ordered Stop   01/23/19 1400  piperacillin-tazobactam (ZOSYN) IVPB 2.25 g  Status:  Discontinued     2.25 g 100 mL/hr over 30 Minutes Intravenous Every 8 hours 01/23/19 0717 01/23/19 0947   01/23/19 1400  cefTRIAXone (ROCEPHIN) 2 g in sodium chloride 0.9 % 100 mL IVPB     2 g 200 mL/hr over 30 Minutes Intravenous Every 24 hours 01/23/19 0947     01/23/19 1400  metroNIDAZOLE (FLAGYL) IVPB 500 mg     500 mg 100 mL/hr over 60 Minutes Intravenous Every 8 hours 01/23/19 0947      01/21/19 1400  piperacillin-tazobactam (ZOSYN) IVPB 3.375 g  Status:  Discontinued     3.375 g 12.5 mL/hr over 240 Minutes Intravenous Every 8 hours 01/21/19 1325 01/23/19 [id number]      Subjective:   [name] seen and examined today.  Maldonado would like to eat something.  Currently denies chest pain, shortness of breath, abdominal pain, nausea or vomiting, diarrhea constipation, dizziness or headache.  Continues to have left knee pain but feels it is improved.   Objective:   Vitals:   01/27/19 0945 01/27/19 1000 01/27/19 1015 01/27/19 1030  BP: 132/80 (!) 144/88 (!) 146/82 122/81  Pulse: 80 80 80 82  Resp: 20 20 18 19   Temp:      TempSrc:      SpO2: 90%     Weight:      Height:        Intake/Output Summary (Last 24 hours) at 01/27/2019 1038 Last data filed at 01/27/2019 0802 Gross Maldonado 24 hour  Intake 700 ml  Output 1100 ml  Net -400 ml   Filed Weights   01/25/19 2333 01/26/19 0500 01/27/19 0835  Weight: (!) 166 kg (!) 165.7 kg (!) 164.2 kg   Exam  General: Well developed, well nourished, NAD, appears stated age  8: NCAT, mucous membranes moist.   Cardiovascular: S1 S2 auscultated, RRR, Phillip murmur  Respiratory: Diminished breath sounds however clear, Phillip wheezing  Abdomen: Soft, obese, nontender, nondistended, + bowel sounds  Extremities: warm dry without cyanosis clubbing.  Mild upper and lower extremity edema.  TTP- left knee.  Neuro: AAOx3, nonfocal  Psych: Pleasant, appropriate mood and affect   Data Reviewed: I have personally reviewed following labs and imaging studies  CBC: Recent Labs  Lab 01/21/19 1817 01/22/19 0102 01/23/19 0340 01/24/19 0150 01/25/19 0143 01/27/19 0429  WBC 18.1* 20.5* 14.1* 11.6* 11.2* 11.2*  NEUTROABS 15.0* 16.7* 11.8* 9.1* 8.7*  --   HGB 13.4 12.3* 11.2* 10.9* 11.5* 11.2*  HCT 43.1 38.1* 36.2* 34.8* 36.7* 35.0*  MCV 90.4 89.4 92.3 90.4 91.3 88.8  PLT 239 239 178 197 214 [id number]   Basic Metabolic Panel: Recent  Labs  Lab 01/21/19 1817 01/22/19 0102 01/23/19 0340 01/24/19 0150 01/25/19 0143 01/26/19 0437 01/27/19 0429  NA 136 133* 135 134* 134* 136 133*  K 4.1 3.6 4.0 4.0 4.3 4.3 4.6  CL 95* 97* 99 97* 98 99 98  CO2 24 24 25 22  21* 20* 19*  GLUCOSE 265* 212* 152* 161* 175* 187* 194*  BUN 27* 33* 52* 63* 75* 54* 70*  CREATININE 2.25* 2.99* 5.25* 6.69* 8.26* 7.31* 8.40*  CALCIUM 8.6* 7.8* 8.0* 8.1* 8.3* 8.3* 8.3*  MG 1.7 1.7 1.8 1.9 2.0  --   --   PHOS 2.4* 3.1 3.7 4.0 4.8*  --   --    GFR: Estimated Creatinine Clearance: 13 mL/min (A) (by C-G formula based on SCr of 8.4 mg/dL (H)). Liver Function Tests: Recent Labs  Lab 01/22/19 0102 01/23/19 0340 01/24/19 0150 01/25/19 0143 01/27/19 0429  AST 277* 102* 54* 37 20  ALT 230* 130* 87* 65* 32  ALKPHOS 102 92 107 122 115  BILITOT 5.3* 2.3* 1.3* 1.2 1.2  PROT 6.6 6.3* 6.4* 6.8 6.8  ALBUMIN 3.1* 2.6* 2.4* 2.6* 2.0*   Recent Labs  Lab 01/20/19 1114 01/21/19 1332 01/21/19 1900 01/22/19 0822 01/23/19 0500 01/24/19 0150  LIPASE 31 1,054*  --  219* 79* 38  AMYLASE  --   --  509*  --   --   --    Phillip results for input(s): AMMONIA in the last 168 hours. Coagulation Profile: Recent Labs  Lab 01/20/19 1114 01/21/19 2305  INR 1.0 1.4*   Cardiac Enzymes: Recent Labs  Lab 01/21/19 2305  CKTOTAL 115   BNP (last 3 results) Phillip results for input(s): PROBNP in the last 8760 hours. HbA1C: Phillip results for input(s): HGBA1C in the last 72 hours. CBG: Recent Labs  Lab 01/26/19 0815 01/26/19 1223 01/26/19 1704 01/26/19 2114 01/27/19 0758  GLUCAP 159* 195* 194* 190* 170*   Lipid Profile: Phillip results for input(s): CHOL, HDL, LDLCALC, TRIG, CHOLHDL, LDLDIRECT in the last 72 hours. Thyroid Function Tests: Phillip results for input(s): TSH, T4TOTAL, FREET4, T3FREE, THYROIDAB in the last 72 hours. Anemia Panel: Phillip results for input(s): VITAMINB12, FOLATE, FERRITIN, TIBC, IRON, RETICCTPCT in the last 72 hours. Urine analysis:     Component Value Date/Time   COLORURINE RED (A) 01/23/2019 1402   APPEARANCEUR TURBID (A) 01/23/2019 1402   LABSPEC  01/23/2019 1402    TEST NOT REPORTED DUE TO COLOR INTERFERENCE OF URINE PIGMENT   PHURINE  01/23/2019 1402    TEST NOT REPORTED DUE TO COLOR INTERFERENCE OF URINE PIGMENT   GLUCOSEU (A) 01/23/2019 1402    TEST NOT REPORTED DUE TO COLOR INTERFERENCE OF URINE PIGMENT   HGBUR (A) 01/23/2019 1402    TEST NOT REPORTED DUE TO COLOR INTERFERENCE OF URINE PIGMENT   BILIRUBINUR (A) 01/23/2019 1402    TEST NOT REPORTED DUE TO COLOR INTERFERENCE OF URINE PIGMENT   BILIRUBINUR Large 12/30/2015 1600  KETONESUR (A) 01/23/2019 1402    TEST NOT REPORTED DUE TO COLOR INTERFERENCE OF URINE PIGMENT   PROTEINUR (A) 01/23/2019 1402    TEST NOT REPORTED DUE TO COLOR INTERFERENCE OF URINE PIGMENT   UROBILINOGEN 0.2 12/30/2015 1600   UROBILINOGEN 1.0 09/15/2013 1855   NITRITE (A) 01/23/2019 1402    TEST NOT REPORTED DUE TO COLOR INTERFERENCE OF URINE PIGMENT   LEUKOCYTESUR (A) 01/23/2019 1402    TEST NOT REPORTED DUE TO COLOR INTERFERENCE OF URINE PIGMENT   Sepsis Labs: @LABRCNTIP (procalcitonin:4,lacticidven:4)  ) Recent Results (from the past 240 hour(s))  SARS CORONAVIRUS 2 (TAT 6-24 HRS) Nasopharyngeal Nasopharyngeal Swab     Status: None   Collection Time: 01/20/19  6:20 PM   Specimen: Nasopharyngeal Swab  Result Value Ref Range Status   SARS Coronavirus 2 NEGATIVE NEGATIVE Final    Comment: (NOTE) SARS-CoV-2 target nucleic acids are NOT DETECTED. The SARS-CoV-2 RNA is generally detectable in upper and lower respiratory specimens during the acute phase of infection. Negative results do not preclude SARS-CoV-2 infection, do not rule out co-infections with other pathogens, and should not be used as the sole basis for treatment or other Maldonado management decisions. Negative results must be combined with clinical observations, Maldonado history, and epidemiological information. The  expected result is Negative. Fact Sheet for Patients: SugarRoll.be Fact Sheet for Healthcare Providers: https://www.woods-mathews.com/ This test is not yet approved or cleared by the [place] FDA and  has been authorized for detection and/or diagnosis of SARS-CoV-2 by FDA under an Emergency Use Authorization (EUA). This EUA will remain  in effect (meaning this test can be used) for the duration of the COVID-19 declaration under Section 56 4(b)(1) of the Act, 21 U.S.C. section 360bbb-3(b)(1), unless the authorization is terminated or revoked sooner. Performed at [facility], [place] [address], [place], [place] [address]   Culture, Urine     Status: Abnormal   Collection Time: 01/21/19  1:03 PM   Specimen: Urine, Random  Result Value Ref Range Status   Specimen Description   Final    URINE, RANDOM Performed at [place] [address], [place], [place] [address]    Special Requests   Final    NONE Performed at [facility], [place] [address] [place], [place] [address]    Culture (A)  Final    40,000 COLONIES/mL MULTIPLE SPECIES PRESENT, SUGGEST RECOLLECTION   Report Status 01/23/2019 FINAL  Final  Culture, blood (routine x 2)     Status: None   Collection Time: 01/21/19  1:32 PM   Specimen: BLOOD  Result Value Ref Range Status   Specimen Description   Final    BLOOD RIGHT ANTECUBITAL Performed at [facility], [place] [address] [place], [place] [address]    Special Requests   Final    BOTTLES DRAWN AEROBIC AND ANAEROBIC Blood Culture adequate volume Performed at [place] [address], [place], [place] [address]    Culture   Final    Phillip GROWTH 5 DAYS Performed at [facility], [place] [address] [place],  [address]    Report Status 01/26/2019 FINAL  Final  Culture, blood (routine x 2)     Status: Abnormal   Collection Time: 01/21/19  1:33 PM    Specimen: BLOOD  Result Value Ref Range Status   Specimen Description   Final    BLOOD RIGHT HAND Performed at [place]  [address] [place], [place] [address]    Special Requests   Final    BOTTLES DRAWN AEROBIC AND ANAEROBIC Blood Culture adequate volume Performed at [place] [address], [place], [place] [address]    Culture  Setup Time   Final    ANAEROBIC BOTTLE ONLY GRAM NEGATIVE RODS CRITICAL RESULT CALLED TO, READ BACK BY AND VERIFIED WITH: Irwin Brakeman [id number] 01/24/2019 T. TYSOR    Culture (A)  Final    BACTEROIDES VULGATUS BETA LACTAMASE POSITIVE Performed at [facility], [place] [address] [place], [place] [address]    Report Status 01/26/2019 FINAL  Final  Blood Culture ID Panel (Reflexed)     Status: None   Collection Time: 01/21/19  1:33 PM  Result Value Ref Range Status   Enterococcus species NOT DETECTED NOT DETECTED Final   Listeria monocytogenes NOT DETECTED NOT DETECTED Final   Staphylococcus species NOT DETECTED NOT DETECTED Final   Staphylococcus aureus (BCID) NOT DETECTED NOT DETECTED Final   Streptococcus species NOT DETECTED NOT DETECTED Final   Streptococcus agalactiae NOT DETECTED NOT DETECTED Final   Streptococcus pneumoniae NOT DETECTED NOT DETECTED Final   Streptococcus pyogenes NOT DETECTED NOT DETECTED Final   Acinetobacter baumannii NOT DETECTED NOT DETECTED Final   Enterobacteriaceae species NOT DETECTED NOT DETECTED Final   Enterobacter cloacae complex NOT DETECTED NOT DETECTED Final   Escherichia coli NOT DETECTED NOT DETECTED Final   Klebsiella oxytoca NOT DETECTED NOT DETECTED Final   Klebsiella pneumoniae NOT DETECTED NOT DETECTED Final   Proteus species NOT DETECTED NOT DETECTED Final   Serratia marcescens NOT DETECTED NOT DETECTED Final   Haemophilus influenzae NOT DETECTED NOT DETECTED Final   Neisseria meningitidis NOT DETECTED NOT DETECTED Final   Pseudomonas aeruginosa  NOT DETECTED NOT DETECTED Final   Candida albicans NOT DETECTED NOT DETECTED Final   Candida glabrata NOT DETECTED NOT DETECTED Final   Candida krusei NOT DETECTED NOT DETECTED Final   Candida parapsilosis NOT DETECTED NOT DETECTED Final   Candida tropicalis NOT DETECTED NOT DETECTED Final    Comment: Performed at [facility], [address], [place], [place] [address]  MRSA PCR Screening     Status: None   Collection Time: 01/21/19  7:28 PM   Specimen: Nasal Mucosa; Nasopharyngeal  Result Value Ref Range Status   MRSA by PCR NEGATIVE NEGATIVE Final    Comment:        The GeneXpert MRSA Assay (FDA approved for NASAL specimens only), is one component of a comprehensive MRSA colonization surveillance program. It is not intended to diagnose MRSA infection nor to guide or monitor treatment for MRSA infections. Performed at [facility], [place] [address], [place], [place] [address]   Culture, Urine     Status: None   Collection Time: 01/23/19  2:02 PM   Specimen: Urine, Random  Result Value Ref Range Status   Specimen Description   Final    URINE, RANDOM Performed at [place] [address] [place], [place] [address]    Special Requests   Final    NONE Performed at [facility], [place] [address], [place], [place] [address]    Culture   Final    Phillip GROWTH Performed at [facility], [place] [address], [place], [place] [address]    Report Status 01/24/2019 FINAL  Final  Body fluid culture     Status: None (Preliminary result)   Collection Time: 01/26/19  12:43 PM   Specimen: Body Fluid  Result Value Ref Range Status   Specimen Description FLUID  Final   Special Requests SYNOVIAL KNEE  Final   Gram Stain   Final    WBC PRESENT,BOTH PMN AND MONONUCLEAR Phillip ORGANISMS SEEN CYTOSPIN SMEAR Performed at [facility], [address], [place], [place] [address]    Culture PENDING  Incomplete   Report Status  PENDING  Incomplete      Radiology Studies: Dg Chest Port 1 View  Result Date: 01/25/2019 CLINICAL DATA:  Central line placement EXAM: PORTABLE CHEST 1 VIEW COMPARISON:  January 25, 2019 FINDINGS: A new central line is been placed terminating near the caval atrial junction. Platelike opacity in the medial right lung base is probably at least partially explained by atelectasis. There is a left-sided pleural effusion with underlying opacity. Phillip pneumothorax. The cardiomediastinal silhouette is stable. IMPRESSION: 1. The new right central line is in good position.  Phillip pneumothorax. 2. Probable atelectasis in the medial right lung base. 3. Small left pleural effusion with underlying opacity. Recommend follow-up to resolution. Electronically Signed   By: Dorise Bullion III M.D   On: 01/25/2019 16:40     Scheduled Meds: . Chlorhexidine Gluconate Cloth  6 each Topical Daily  . Chlorhexidine Gluconate Cloth  6 each Topical Q0600  . Chlorhexidine Gluconate Cloth  6 each Topical Q0600  . DULoxetine  60 mg Oral Daily  . heparin injection (subcutaneous)  5,000 Units Subcutaneous Q8H  . insulin aspart  0-5 Units Subcutaneous QHS  . insulin aspart  0-9 Units Subcutaneous TID WC  . insulin glargine  20 Units Subcutaneous Q2200  . mouth rinse  15 mL Mouth Rinse BID  . pantoprazole (PROTONIX) IV  40 mg Intravenous Q12H  . simethicone  80 mg Oral QID  . sodium chloride flush  10-40 mL Intracatheter Q12H  . thiamine injection  100 mg Intravenous Daily  . traZODone  300 mg Oral QHS   Continuous Infusions: . cefTRIAXone (ROCEPHIN)  IV 2 g (01/26/19 1254)  . metronidazole 500 mg (01/27/19 0131)     LOS: 7 days   Time Spent in minutes   45 minutes (greater than 50% of time spent with Maldonado face to face, as well as reviewing records, calling consults, and formulating a plan)   Cristal Ford D.O. on 01/27/2019 at 10:38 AM  Between 7am to 7pm - Please see pager noted on [email or web address]  After 7pm go  to [email or web address]  And look for the night coverage person covering for me after hours  [facility] Office  [phone]

## 2019-01-27 NOTE — Progress Notes (Signed)
[facility] Progress Note  Subjective: creat up slightly 8.3 this am. On HD now.   Vitals:   01/27/19 1115 01/27/19 1130 01/27/19 1135 01/27/19 1200  BP: 138/74 138/80 (!) 156/84 140/65  Pulse: 77 74 72 79  Resp: 18 (!) 21 (!) 22 20  Temp:   98.4 F (36.9 C) 97.8 F (36.6 C)  TempSrc:   Axillary Oral  SpO2:   92% 93%  Weight:   (!) 162.2 kg (!) 161.5 kg  Height:        Inpatient medications: . Chlorhexidine Gluconate Cloth  6 each Topical Daily  . Chlorhexidine Gluconate Cloth  6 each Topical Q0600  . Chlorhexidine Gluconate Cloth  6 each Topical Q0600  . DULoxetine  60 mg Oral Daily  . heparin injection (subcutaneous)  5,000 Units Subcutaneous Q8H  . insulin aspart  0-5 Units Subcutaneous QHS  . insulin aspart  0-9 Units Subcutaneous TID WC  . insulin glargine  20 Units Subcutaneous Q2200  . mouth rinse  15 mL Mouth Rinse BID  . pantoprazole (PROTONIX) IV  40 mg Intravenous Q12H  . simethicone  80 mg Oral QID  . sodium chloride flush  10-40 mL Intracatheter Q12H  . thiamine injection  100 mg Intravenous Daily  . traZODone  300 mg Oral QHS   . cefTRIAXone (ROCEPHIN)  IV 2 g (01/27/19 1253)  . metronidazole 500 mg (01/27/19 1641)   bisacodyl, [START ON 01/28/2019] heparin, HYDROmorphone (DILAUDID) injection, hydrOXYzine, levalbuterol, ondansetron **OR** ondansetron (ZOFRAN) IV, polyethylene glycol, sodium chloride flush    Exam: Gen alert, on distress No rash, cyanosis or gangrene Sclera anicteric, throat clear  +JVD Chest mild rales bibasilar RRR no MRG Abd soft ntnd no mass or ascites +bs GU normal male w foley in place defe MS no joint effusions or deformity Ext mild bilat UE edema, no LE edema Neuro is alert, Ox 3 , nf       Home meds:   - metoprolol 50 bid/ metolazone 2.5 qd/ lisinopril 40 qd/ torsemide 100 bid/ amlodipine 10/ losartan 100/ amlodipine  - methadone 10 tid prn/ naloxegol oxalate 25 qd  - insulin glargine 20 hs/ ? Metformin    - gabapentin 300 tid prn/ duloxetine 60 bid/ trazodone 300 hs/ ? lyrica  - pantoprazole 40 qd/ atorvastatin 40   UA turbid, red, >50 rbc and 11-20 wbc   UN 65  UCr 114  Renal US > IMPRESSION:    Limited visualization of the kidneys due to body habitus. No obstruction. Normal renal size. Urinary bladder empty with Foley catheter.  Assessment/ Plan: 1. Acute renal failure - combination of IV contrast/ hypotension-shock w/  ARB/ ACEi on board. Creat worsened and pt had 1st HD Wed night and 2nd HD ongoing this am.  UOP picked up 1000 cc yest, will hold HD over the weekend and look for sx's of recovery.  2. Septic shock/ abd pain - resolved, suspected gallstone pancreatitis 3. HTN -long hx 4. Chronic pain 5. Depression 6. Chronic CHF - lungs clear by latest CXR 7. Vol excess - sig LE edema, lungs ok today      Rob Tifanie Gardiner 01/27/2019, 5:02 PM  Iron/TIBC/Ferritin/ %Sat    Component Value Date/Time   IRON 35 (L) 09/16/2013 0243   TIBC 397 09/16/2013 0243   FERRITIN 11 (L) 09/16/2013 0243   IRONPCTSAT 9 (L) 09/16/2013 0243   Recent Labs  Lab 01/21/19 2305  01/25/19 0143  01/27/19 0429  NA  --    < >  134*   < > 133*  K  --    < > 4.3   < > 4.6  CL  --    < > 98   < > 98  CO2  --    < > 21*   < > 19*  GLUCOSE  --    < > 175*   < > 194*  BUN  --    < > 75*   < > 70*  CREATININE  --    < > 8.26*   < > 8.40*  CALCIUM  --    < > 8.3*   < > 8.3*  PHOS  --    < > 4.8*  --   --   ALBUMIN  --    < > 2.6*  --  2.0*  INR 1.4*  --   --   --   --    < > = values in this interval not displayed.   Recent Labs  Lab 01/27/19 0429  AST 20  ALT 32  ALKPHOS 115  BILITOT 1.2  PROT 6.8   Recent Labs  Lab 01/27/19 0429  WBC 11.2*  HGB 11.2*  HCT 35.0*  PLT 271

## 2019-01-28 DIAGNOSIS — M109 Gout, unspecified: Secondary | ICD-10-CM

## 2019-01-28 DIAGNOSIS — M25562 Pain in left knee: Secondary | ICD-10-CM

## 2019-01-28 LAB — RENAL FUNCTION PANEL
Albumin: 2.1 g/dL — ABNORMAL LOW (ref 3.5–5.0)
Anion gap: 16 — ABNORMAL HIGH (ref 5–15)
BUN: 57 mg/dL — ABNORMAL HIGH (ref 8–23)
CO2: 21 mmol/L — ABNORMAL LOW (ref 22–32)
Calcium: 8.4 mg/dL — ABNORMAL LOW (ref 8.9–10.3)
Chloride: 98 mmol/L (ref 98–111)
Creatinine, Ser: 6.74 mg/dL — ABNORMAL HIGH (ref 0.61–1.24)
GFR calc Af Amer: 9 mL/min — ABNORMAL LOW (ref >60)
GFR calc non Af Amer: 8 mL/min — ABNORMAL LOW (ref >60)
Glucose, Bld: 188 mg/dL — ABNORMAL HIGH (ref 70–99)
Phosphorus: 6.1 mg/dL — ABNORMAL HIGH (ref 2.5–4.6)
Potassium: 4.3 mmol/L (ref 3.5–5.1)
Sodium: 135 mmol/L (ref 135–145)

## 2019-01-28 LAB — GLUCOSE, CAPILLARY
Glucose-Capillary: 202 mg/dL — ABNORMAL HIGH (ref 70–99)
Glucose-Capillary: 185 mg/dL — ABNORMAL HIGH (ref 70–99)
Glucose-Capillary: 190 mg/dL — ABNORMAL HIGH (ref 70–99)
Glucose-Capillary: 296 mg/dL — ABNORMAL HIGH (ref 70–99)

## 2019-01-28 LAB — HEPATITIS B SURFACE ANTIBODY, QUANTITATIVE: Hep B S AB Quant (Post): <3.1 m[IU]/mL — ABNORMAL LOW (ref >9.9)

## 2019-01-28 MED ORDER — METOPROLOL SUCCINATE ER 100 MG PO TB24
100.0000 mg | ORAL_TABLET | Freq: Every day | ORAL | Status: DC
  Administered 2019-01-28 – 2019-02-10 (×14): 100 mg via ORAL
  Filled 2019-01-28 (×15): qty 1

## 2019-01-28 MED ORDER — AMLODIPINE BESYLATE 5 MG PO TABS
5.0000 mg | ORAL_TABLET | Freq: Every day | ORAL | Status: DC
  Administered 2019-01-28 – 2019-02-10 (×14): 5 mg via ORAL
  Filled 2019-01-28 (×14): qty 1

## 2019-01-28 MED ORDER — OXYCODONE-ACETAMINOPHEN 5-325 MG PO TABS
1.0000 | ORAL_TABLET | Freq: Four times a day (QID) | ORAL | Status: DC | PRN
  Administered 2019-01-28 – 2019-02-01 (×12): 2 via ORAL
  Administered 2019-02-02: 1 via ORAL
  Filled 2019-01-28 (×2): qty 2
  Filled 2019-01-28: qty 1
  Filled 2019-01-28 (×3): qty 2
  Filled 2019-01-28: qty 1
  Filled 2019-01-28 (×7): qty 2
  Filled 2019-01-28: qty 1

## 2019-01-28 MED ORDER — PREDNISONE 20 MG PO TABS
40.0000 mg | ORAL_TABLET | Freq: Every day | ORAL | Status: DC
  Administered 2019-01-28 – 2019-01-30 (×3): 40 mg via ORAL
  Filled 2019-01-28 (×3): qty 2

## 2019-01-28 NOTE — Progress Notes (Signed)
[facility] Progress Note  Subjective: creat 6.5 today, had 2nd Hd yesterday. C/o swollen arms > legs, no SOB, eating solid food now   Vitals:   01/27/19 1200 01/27/19 2146 01/28/19 0611 01/28/19 0626  BP: 140/65 (!) 154/84 (!) 182/87 (!) 169/87  Pulse: 79 83 78   Resp: 20 15 (!) 21   Temp: 97.8 F (36.6 C) 97.8 F (36.6 C) 97.8 F (36.6 C)   TempSrc: Oral Oral Oral   SpO2: 93% 91% 92%   Weight: (!) 161.5 kg     Height:        Inpatient medications: . amLODipine  5 mg Oral Daily  . Chlorhexidine Gluconate Cloth  6 each Topical Daily  . Chlorhexidine Gluconate Cloth  6 each Topical Q0600  . Chlorhexidine Gluconate Cloth  6 each Topical Q0600  . DULoxetine  60 mg Oral Daily  . heparin injection (subcutaneous)  5,000 Units Subcutaneous Q8H  . insulin aspart  0-5 Units Subcutaneous QHS  . insulin aspart  0-9 Units Subcutaneous TID WC  . insulin glargine  20 Units Subcutaneous Q2200  . mouth rinse  15 mL Mouth Rinse BID  . metoprolol succinate  100 mg Oral Daily  . pantoprazole (PROTONIX) IV  40 mg Intravenous Q12H  . simethicone  80 mg Oral QID  . sodium chloride flush  10-40 mL Intracatheter Q12H  . thiamine injection  100 mg Intravenous Daily  . traZODone  300 mg Oral QHS   . cefTRIAXone (ROCEPHIN)  IV 2 g (01/27/19 1253)  . metronidazole 500 mg (01/28/19 0819)   bisacodyl, heparin, HYDROmorphone (DILAUDID) injection, hydrOXYzine, levalbuterol, ondansetron **OR** ondansetron (ZOFRAN) IV, polyethylene glycol, sodium chloride flush    Exam: Gen alert, on distress No rash, cyanosis or gangrene Sclera anicteric, throat clear  +JVD Chest R clear, L base bronch BS RRR no MRG Abd soft ntnd no mass or ascites +bs GU normal male w foley in place defe MS no joint effusions or deformity Ext bilat LE edema 2+ and UE 2+ Neuro is alert, Ox 3 , nf       Home meds:   - metoprolol 50 bid/ metolazone 2.5 qd/ lisinopril 40 qd/ torsemide 100 bid/ amlodipine 10/  losartan 100/ amlodipine  - methadone 10 tid prn/ naloxegol oxalate 25 qd  - insulin glargine 20 hs/ ? Metformin   - gabapentin 300 tid prn/ duloxetine 60 bid/ trazodone 300 hs/ ? lyrica  - pantoprazole 40 qd/ atorvastatin 40   UA turbid, red, >50 rbc and 11-20 wbc   UN 65  UCr 114  Renal US > IMPRESSION:    Limited visualization of the kidneys due to body habitus. No obstruction. Normal renal size. Urinary bladder empty with Foley catheter.  Assessment/ Plan: 1. Acute renal failure - combination of IV contrast/ hypotension-shock w/  ARB/ ACEi on board. Creat worsened and he got HD on 11/1 and on 11/13. Will hold HD for the next 1-2 days to see if pt recovering. He is vol overloaded so not giving IVF's.  2. Septic shock/ abd pain - resolved, suspected gallstone pancreatitis 3. HTN -long hx 4. Chronic pain 5. Depression 6. Chronic CHF - lungs clear by latest CXR 7. Vol excess - as above, tolerating well      Rob Neveen Daponte 01/28/2019, 12:54 PM  Iron/TIBC/Ferritin/ %Sat    Component Value Date/Time   IRON 35 (L) 09/16/2013 0243   TIBC 397 09/16/2013 0243   FERRITIN 11 (L) 09/16/2013 0243   IRONPCTSAT  9 (L) 09/16/2013 0243   Recent Labs  Lab 01/21/19 2305  01/28/19 0428  NA  --    < > 135  K  --    < > 4.3  CL  --    < > 98  CO2  --    < > 21*  GLUCOSE  --    < > 188*  BUN  --    < > 57*  CREATININE  --    < > 6.74*  CALCIUM  --    < > 8.4*  PHOS  --    < > 6.1*  ALBUMIN  --    < > 2.1*  INR 1.4*  --   --    < > = values in this interval not displayed.   Recent Labs  Lab 01/27/19 0429  AST 20  ALT 32  ALKPHOS 115  BILITOT 1.2  PROT 6.8   Recent Labs  Lab 01/27/19 0429  WBC 11.2*  HGB 11.2*  HCT 35.0*  PLT 271

## 2019-01-28 NOTE — Progress Notes (Addendum)
PROGRESS NOTE    Phillip Maldonado  [name] DOB: [birth year] DOA: 01/20/2019 PCP: Patient, No Pcp Per   Brief Narrative:  HPI on 01/20/2019 by Dr. Merton Border Larome Shadbolt  is a 68 y.o. 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 fib, chronic abdominal pain presenting with acute onset of epigastric pain radiating to the right upper chest associated with nausea vomiting and no diarrhea.  The pain was radiating to the right chest and the patient had full work-up in the emergency room including an EKG, troponins CT of abdomen and pelvis with no significant abnormalities noted.  Patient denies any history of blood in his urine or stools.  Patient had subjective fevers early in the morning at 430 when he wake up with the abdominal pain.  Patient is on chronic pain medications but taking MiraLAX as well. Patient denies any cough or shortness of breath. Patient was recently treated for upper GI bleed and case discussed with Dr. Michail Sermon from GI. Work-up in the emergency room showed hyperglycemia otherwise most of his blood work is full and within the normal range when showed colonic diverticulosis  Interim History Patient mated for gallstone pancreatitis.  However he was noted to have septic shock on 01/21/2019 and was transferred to the ICU.  Patient was eventually weaned off of pressors and transferred back to the floor.  He did have MRCP which showed acute pancreatitis, cholelithiasis, no CBD obstruction.  Patient suspected to have gallstone pancreatitis.  General surgery consulted and appreciated, however patient not medically stable for surgical intervention at this time.  Patient was also noted to have worsening creatinine, and nephrology has been seeing him.  Patient needing temporary hemodialysis.  He was then transferred to [facility]. Assessment & Plan   Septic shock likely secondary to acute cholecystitis/ Acute  gallstone pancreatitis -Was noted to be mildly tachycardic and tachypneic on admission, however not febrile, without leukocytosis, and blood pressure was normal at that time -However over the next 24 hours, patient did develop leukocytosis of 22, became hypotensive and required ICU admission for pressors -Patient was also noted to have an elevated lipase as well as LFTs, however these did improve as hospitalization continued. -MRCP showed mild peripancreatic fluid along with pancreatic head/uncinate process likely related to acute pancreatitis.  No fluid collection or pseudocyst.  Cholelithiasis. -HIDA scan was ordered, however refused by patient -Gastroenterology consulted and appreciated, recommended follow-up with Dr. Arelia Longest in 2 to 4 weeks -General surgery was consulted and appreciated, patient was not a surgical candidate at this time given his medical issues.  May consider surgery next week versus outpatient. -Continue ceftriaxone and Flagyl -Blood cultures 01/21/2019: anaerobic bottle only with gram-negative rods- bacteroides vulgatus -Tolerated full liquids, will place on soft diet today  Acute kidney injury -Upon presentation, patient's creatinine was normal -However he developed AKI in the setting of septic shock.  Despite IV hydration, creatinine peaked to 8.26. Today creatinine 6.74-however patient dialyzed on 01/27/2019 -Renal ultrasound showed no obstruction -Urine culture showed no growth -Nephrology consulted and appreciated -PCCM placed central venous catheter  Acute respiratory failure with hypoxia -History of COPD, heart failure, OSA, however not on oxygen supplementation at home -Patient was requiring supplemental oxygen however has now been weaned off, and currently on room air maintaining oxygen saturations in the 90s  Essential hypertension -BP elevated today -will restart amlodipine today  Hyperlipidemia -Statin on hold given elevated LFTs  Acute diastolic CHF  -  Patient does appear to be mildly overloaded, suspect secondary to IV fluids given for acute kidney injury and sepsis -Echocardiogram on 01/22/2019 showed an EF of [id number], grade 1 diastolic dysfunction -Suspect fluid status will improve with temporary hemodialysis  Diabetes mellitus, type II -Metformin held -Continue ISS and lantus with CBG monitoring  -Hemoglobin A1c 8.6  GERD/history of GI bleed -Continue PPI  Morbid obesity/sleep apnea -BMI 53.95 -Patient to follow-up with his PCP to discuss lifestyle modifications -patient does not use CPAP at home  Left knee pain -Patient was supposed to have some type of procedure few months ago however this was delayed as his wife had Covid. -Orthopedics consulted and appreciated, status post left knee aspiration and injection (under 90 mL of clear fluid was obtained, 6 mL of 0.5% Marcaine and 80 mg of Depo-Medrol injected)  DVT Prophylaxis Heparin  Code Status: Full  Family Communication: None at bedside  Disposition Plan: Admitted.  Currently pending improvement in renal function as well as possible surgical intervention this admission.  Disposition to be determined.  Consultants PCCM General surgery Nephrology Gastroenterology Orthopedic surgery  Procedures  Echocardiogram Insertion of central venous catheter Renal ultrasound MRCP Left knee aspiration and injection  Antibiotics   Anti-infectives (From admission, onward)   Start     Dose/Rate Route Frequency Ordered Stop   01/23/19 1400  piperacillin-tazobactam (ZOSYN) IVPB 2.25 g  Status:  Discontinued     2.25 g 100 mL/hr over 30 Minutes Intravenous Every 8 hours 01/23/19 0717 01/23/19 0947   01/23/19 1400  cefTRIAXone (ROCEPHIN) 2 g in sodium chloride 0.9 % 100 mL IVPB     2 g 200 mL/hr over 30 Minutes Intravenous Every 24 hours 01/23/19 0947     01/23/19 1400  metroNIDAZOLE (FLAGYL) IVPB 500 mg     500 mg 100 mL/hr over 60 Minutes Intravenous Every 8 hours 01/23/19  0947     01/21/19 1400  piperacillin-tazobactam (ZOSYN) IVPB 3.375 g  Status:  Discontinued     3.375 g 12.5 mL/hr over 240 Minutes Intravenous Every 8 hours 01/21/19 1325 01/23/19 [id number]      Subjective:   Phillip Maldonado seen and examined today.  Patient with no complaints this morning.  Denies chest pain, shortness of breath, abdominal pain, nausea vomiting, diarrhea or constipation, dizziness or headache.  States that he felt he had to go to the bathroom yesterday.  Feels left knee pain is also improving.  Objective:   Vitals:   01/27/19 1200 01/27/19 2146 01/28/19 0611 01/28/19 0626  BP: 140/65 (!) 154/84 (!) 182/87 (!) 169/87  Pulse: 79 83 78   Resp: 20 15 (!) 21   Temp: 97.8 F (36.6 C) 97.8 F (36.6 C) 97.8 F (36.6 C)   TempSrc: Oral Oral Oral   SpO2: 93% 91% 92%   Weight: (!) 161.5 kg     Height:        Intake/Output Summary (Last 24 hours) at 01/28/2019 0954 Last data filed at 01/28/2019 [id number] Gross per 24 hour  Intake 250 ml  Output 2375 ml  Net -2125 ml   Filed Weights   01/27/19 0835 01/27/19 1135 01/27/19 1200  Weight: (!) 164.2 kg (!) 162.2 kg (!) 161.5 kg   Exam  General: Well developed, well nourished, NAD, appears stated age  HEENT: NCAT, mucous membranes moist.   Cardiovascular: S1 S2 auscultated, RRR, no murmur  Respiratory: Clear to auscultation bilaterally with equal chest rise  Abdomen: Soft, obese, nontender, nondistended, + bowel sounds  Extremities:  warm dry without cyanosis clubbing. Left knee mildly TTP  Neuro: AAOx3, nonfocal  Psych: Pleasant, appropriate mood and affect   Data Reviewed: I have personally reviewed following labs and imaging studies  CBC: Recent Labs  Lab 01/21/19 1817 01/22/19 0102 01/23/19 0340 01/24/19 0150 01/25/19 0143 01/27/19 0429  WBC 18.1* 20.5* 14.1* 11.6* 11.2* 11.2*  NEUTROABS 15.0* 16.7* 11.8* 9.1* 8.7*  --   HGB 13.4 12.3* 11.2* 10.9* 11.5* 11.2*  HCT 43.1 38.1* 36.2* 34.8* 36.7* 35.0*   MCV 90.4 89.4 92.3 90.4 91.3 88.8  PLT 239 239 178 197 214 [id number]   Basic Metabolic Panel: Recent Labs  Lab 01/21/19 1817 01/22/19 0102 01/23/19 0340 01/24/19 0150 01/25/19 0143 01/26/19 0437 01/27/19 0429 01/28/19 0428  NA 136 133* 135 134* 134* 136 133* 135  K 4.1 3.6 4.0 4.0 4.3 4.3 4.6 4.3  CL 95* 97* 99 97* 98 99 98 98  CO2 24 24 25 22  21* 20* 19* 21*  GLUCOSE 265* 212* 152* 161* 175* 187* 194* 188*  BUN 27* 33* 52* 63* 75* 54* 70* 57*  CREATININE 2.25* 2.99* 5.25* 6.69* 8.26* 7.31* 8.40* 6.74*  CALCIUM 8.6* 7.8* 8.0* 8.1* 8.3* 8.3* 8.3* 8.4*  MG 1.7 1.7 1.8 1.9 2.0  --   --   --   PHOS 2.4* 3.1 3.7 4.0 4.8*  --   --  6.1*   GFR: Estimated Creatinine Clearance: 16.1 mL/min (A) (by C-G formula based on SCr of 6.74 mg/dL (H)). Liver Function Tests: Recent Labs  Lab 01/22/19 0102 01/23/19 0340 01/24/19 0150 01/25/19 0143 01/27/19 0429 01/28/19 0428  AST 277* 102* 54* 37 20  --   ALT 230* 130* 87* 65* 32  --   ALKPHOS 102 92 107 122 115  --   BILITOT 5.3* 2.3* 1.3* 1.2 1.2  --   PROT 6.6 6.3* 6.4* 6.8 6.8  --   ALBUMIN 3.1* 2.6* 2.4* 2.6* 2.0* 2.1*   Recent Labs  Lab 01/21/19 1332 01/21/19 1900 01/22/19 0822 01/23/19 0500 01/24/19 0150  LIPASE 1,054*  --  219* 79* 38  AMYLASE  --  509*  --   --   --    No results for input(s): AMMONIA in the last 168 hours. Coagulation Profile: Recent Labs  Lab 01/21/19 2305  INR 1.4*   Cardiac Enzymes: Recent Labs  Lab 01/21/19 2305  CKTOTAL 115   BNP (last 3 results) No results for input(s): PROBNP in the last 8760 hours. HbA1C: No results for input(s): HGBA1C in the last 72 hours. CBG: Recent Labs  Lab 01/27/19 0758 01/27/19 1206 01/27/19 1723 01/27/19 2149 01/28/19 0714  GLUCAP 170* 152* 227* 193* 202*   Lipid Profile: No results for input(s): CHOL, HDL, LDLCALC, TRIG, CHOLHDL, LDLDIRECT in the last 72 hours. Thyroid Function Tests: No results for input(s): TSH, T4TOTAL, FREET4, T3FREE, THYROIDAB  in the last 72 hours. Anemia Panel: No results for input(s): VITAMINB12, FOLATE, FERRITIN, TIBC, IRON, RETICCTPCT in the last 72 hours. Urine analysis:    Component Value Date/Time   COLORURINE RED (A) 01/23/2019 1402   APPEARANCEUR TURBID (A) 01/23/2019 1402   LABSPEC  01/23/2019 1402    TEST NOT REPORTED DUE TO COLOR INTERFERENCE OF URINE PIGMENT   PHURINE  01/23/2019 1402    TEST NOT REPORTED DUE TO COLOR INTERFERENCE OF URINE PIGMENT   GLUCOSEU (A) 01/23/2019 1402    TEST NOT REPORTED DUE TO COLOR INTERFERENCE OF URINE PIGMENT   HGBUR (A) 01/23/2019 1402  TEST NOT REPORTED DUE TO COLOR INTERFERENCE OF URINE PIGMENT   BILIRUBINUR (A) 01/23/2019 1402    TEST NOT REPORTED DUE TO COLOR INTERFERENCE OF URINE PIGMENT   BILIRUBINUR Large 12/30/2015 1600   KETONESUR (A) 01/23/2019 1402    TEST NOT REPORTED DUE TO COLOR INTERFERENCE OF URINE PIGMENT   PROTEINUR (A) 01/23/2019 1402    TEST NOT REPORTED DUE TO COLOR INTERFERENCE OF URINE PIGMENT   UROBILINOGEN 0.2 12/30/2015 1600   UROBILINOGEN 1.0 09/15/2013 1855   NITRITE (A) 01/23/2019 1402    TEST NOT REPORTED DUE TO COLOR INTERFERENCE OF URINE PIGMENT   LEUKOCYTESUR (A) 01/23/2019 1402    TEST NOT REPORTED DUE TO COLOR INTERFERENCE OF URINE PIGMENT   Sepsis Labs: @LABRCNTIP (procalcitonin:4,lacticidven:4)  ) Recent Results (from the past 240 hour(s))  SARS CORONAVIRUS 2 (TAT 6-24 HRS) Nasopharyngeal Nasopharyngeal Swab     Status: None   Collection Time: 01/20/19  6:20 PM   Specimen: Nasopharyngeal Swab  Result Value Ref Range Status   SARS Coronavirus 2 NEGATIVE NEGATIVE Final    Comment: (NOTE) SARS-CoV-2 target nucleic acids are NOT DETECTED. The SARS-CoV-2 RNA is generally detectable in upper and lower respiratory specimens during the acute phase of infection. Negative results do not preclude SARS-CoV-2 infection, do not rule out co-infections with other pathogens, and should not be used as the sole basis for  treatment or other patient management decisions. Negative results must be combined with clinical observations, patient history, and epidemiological information. The expected result is Negative. Fact Sheet for Patients: SugarRoll.be Fact Sheet for Healthcare Providers: https://www.woods-mathews.com/ This test is not yet approved or cleared by the [place] FDA and  has been authorized for detection and/or diagnosis of SARS-CoV-2 by FDA under an Emergency Use Authorization (EUA). This EUA will remain  in effect (meaning this test can be used) for the duration of the COVID-19 declaration under Section 56 4(b)(1) of the Act, 21 U.S.C. section 360bbb-3(b)(1), unless the authorization is terminated or revoked sooner. Performed at [facility], [place] [address] [place],  [place] [address]   Culture, Urine     Status: Abnormal   Collection Time: 01/21/19  1:03 PM   Specimen: Urine, Random  Result Value Ref Range Status   Specimen Description   Final    URINE, RANDOM Performed at [place] [address], [place], [place] [address]    Special Requests   Final    NONE Performed at [facility], [place] [address] [place], [place] [address]    Culture (A)  Final    40,000 COLONIES/mL MULTIPLE SPECIES PRESENT, SUGGEST RECOLLECTION   Report Status 01/23/2019 FINAL  Final  Culture, blood (routine x 2)     Status: None   Collection Time: 01/21/19  1:32 PM   Specimen: BLOOD  Result Value Ref Range Status   Specimen Description   Final    BLOOD RIGHT ANTECUBITAL Performed at [facility], [place] [address], [place], [place] [address]    Special Requests   Final    BOTTLES DRAWN AEROBIC AND ANAEROBIC Blood Culture adequate volume Performed at [place] [address], [place], [place] [address]    Culture   Final    NO GROWTH 5 DAYS Performed at [facility], [place] [address], [place], [place] [address]    Report Status 01/26/2019 FINAL  Final  Culture, blood (routine x 2)     Status: Abnormal   Collection  Time: 01/21/19  1:33 PM   Specimen: BLOOD  Result Value Ref Range Status   Specimen Description   Final    BLOOD RIGHT HAND Performed at [place] [address] [place], [place] [address]    Special Requests   Final    BOTTLES DRAWN AEROBIC AND ANAEROBIC Blood Culture adequate volume Performed at [facility] [address], [place], [place] [address]    Culture  Setup Time   Final    ANAEROBIC BOTTLE ONLY GRAM NEGATIVE RODS CRITICAL RESULT CALLED TO, READ BACK BY AND VERIFIED WITH: Irwin Brakeman [id number] 01/24/2019 T. TYSOR    Culture (A)  Final    BACTEROIDES VULGATUS BETA LACTAMASE POSITIVE Performed at [facility], [place] [address], [place], [place] [address]    Report Status 01/26/2019 FINAL  Final  Blood Culture ID Panel (Reflexed)     Status: None   Collection Time: 01/21/19  1:33 PM  Result Value Ref Range Status   Enterococcus species NOT DETECTED NOT DETECTED Final   Listeria monocytogenes NOT DETECTED NOT DETECTED Final   Staphylococcus species NOT DETECTED NOT DETECTED Final   Staphylococcus aureus (BCID) NOT DETECTED NOT DETECTED Final   Streptococcus species NOT DETECTED NOT DETECTED Final   Streptococcus agalactiae NOT DETECTED NOT DETECTED Final   Streptococcus pneumoniae NOT DETECTED NOT DETECTED Final   Streptococcus pyogenes NOT DETECTED NOT DETECTED Final   Acinetobacter baumannii NOT DETECTED NOT DETECTED Final   Enterobacteriaceae species NOT DETECTED NOT DETECTED Final   Enterobacter cloacae complex NOT DETECTED NOT DETECTED Final   Escherichia coli NOT DETECTED NOT DETECTED Final   Klebsiella oxytoca NOT DETECTED NOT DETECTED Final   Klebsiella pneumoniae NOT DETECTED NOT DETECTED Final   Proteus species NOT DETECTED NOT DETECTED Final   Serratia marcescens NOT  DETECTED NOT DETECTED Final   Haemophilus influenzae NOT DETECTED NOT DETECTED Final   Neisseria meningitidis NOT DETECTED NOT DETECTED Final   Pseudomonas aeruginosa NOT DETECTED NOT DETECTED Final   Candida albicans NOT DETECTED NOT DETECTED Final   Candida glabrata NOT DETECTED NOT DETECTED Final   Candida krusei NOT DETECTED NOT DETECTED Final   Candida parapsilosis NOT DETECTED NOT DETECTED Final   Candida tropicalis NOT DETECTED NOT DETECTED Final    Comment: Performed at [facility], [address], [place], [place] [address]  MRSA PCR Screening     Status: None   Collection Time: 01/21/19  7:28 PM   Specimen: Nasal Mucosa; Nasopharyngeal  Result Value Ref Range Status   MRSA by PCR NEGATIVE NEGATIVE Final    Comment:        The GeneXpert MRSA Assay (FDA approved for NASAL specimens only), is one component of a comprehensive MRSA colonization surveillance program. It is not intended to diagnose MRSA infection nor to guide or monitor treatment for MRSA infections. Performed at [facility], [place] [address] [place], [place] [address]   Culture, Urine     Status: None   Collection Time: 01/23/19  2:02 PM   Specimen: Urine, Random  Result Value Ref Range Status   Specimen Description   Final    URINE, RANDOM Performed at [place] [address], [place], [place] [address]    Special Requests   Final    NONE Performed at [facility], [place] [address] [place], [place] [address]    Culture   Final    NO GROWTH Performed at  [facility], [place] [address], [place], [place] [address]    Report Status 01/24/2019 FINAL  Final  Body fluid culture     Status: None (Preliminary result)   Collection Time: 01/26/19 12:43 PM   Specimen: Body Fluid  Result Value Ref Range Status   Specimen Description FLUID  Final   Special Requests SYNOVIAL KNEE  Final   Gram Stain   Final    WBC PRESENT,BOTH PMN AND  MONONUCLEAR NO ORGANISMS SEEN CYTOSPIN SMEAR    Culture   Final    NO GROWTH < 24 HOURS Performed at [facility], [place] [address], [place], [place] [address]    Report Status PENDING  Incomplete      Radiology Studies: No results found.   Scheduled Meds: . amLODipine  5 mg Oral Daily  . Chlorhexidine Gluconate Cloth  6 each Topical Daily  . Chlorhexidine Gluconate Cloth  6 each Topical Q0600  . Chlorhexidine Gluconate Cloth  6 each Topical Q0600  . DULoxetine  60 mg Oral Daily  . heparin injection (subcutaneous)  5,000 Units Subcutaneous Q8H  . insulin aspart  0-5 Units Subcutaneous QHS  . insulin aspart  0-9 Units Subcutaneous TID WC  . insulin glargine  20 Units Subcutaneous Q2200  . mouth rinse  15 mL Mouth Rinse BID  . metoprolol succinate  100 mg Oral Daily  . pantoprazole (PROTONIX) IV  40 mg Intravenous Q12H  . simethicone  80 mg Oral QID  . sodium chloride flush  10-40 mL Intracatheter Q12H  . thiamine injection  100 mg Intravenous Daily  . traZODone  300 mg Oral QHS   Continuous Infusions: . cefTRIAXone (ROCEPHIN)  IV 2 g (01/27/19 1253)  . metronidazole 500 mg (01/28/19 0819)     LOS: 8 days   Time Spent in minutes   30 minutes  Saleem Coccia D.O. on 01/28/2019 at 9:54 AM  Between 7am to 7pm - Please see pager noted on [email or web address]  After 7pm go to [email or web address]  And look for the night coverage person covering for me after hours  [facility] Office  [phone]

## 2019-01-29 ENCOUNTER — Inpatient Hospital Stay (HOSPITAL_COMMUNITY): Payer: Medicare Other

## 2019-01-29 DIAGNOSIS — M7989 Other specified soft tissue disorders: Secondary | ICD-10-CM

## 2019-01-29 LAB — RENAL FUNCTION PANEL
Albumin: 2 g/dL — ABNORMAL LOW (ref 3.5–5.0)
Anion gap: 15 (ref 5–15)
BUN: 76 mg/dL — ABNORMAL HIGH (ref 8–23)
CO2: 21 mmol/L — ABNORMAL LOW (ref 22–32)
Calcium: 8.5 mg/dL — ABNORMAL LOW (ref 8.9–10.3)
Chloride: 100 mmol/L (ref 98–111)
Creatinine, Ser: 7.33 mg/dL — ABNORMAL HIGH (ref 0.61–1.24)
GFR calc Af Amer: 8 mL/min — ABNORMAL LOW (ref >60)
GFR calc non Af Amer: 7 mL/min — ABNORMAL LOW (ref >60)
Glucose, Bld: 226 mg/dL — ABNORMAL HIGH (ref 70–99)
Phosphorus: 7.9 mg/dL — ABNORMAL HIGH (ref 2.5–4.6)
Potassium: 4.5 mmol/L (ref 3.5–5.1)
Sodium: 136 mmol/L (ref 135–145)

## 2019-01-29 LAB — BODY FLUID CULTURE: Culture: NO GROWTH

## 2019-01-29 LAB — GLUCOSE, CAPILLARY
Glucose-Capillary: 189 mg/dL — ABNORMAL HIGH (ref 70–99)
Glucose-Capillary: 206 mg/dL — ABNORMAL HIGH (ref 70–99)
Glucose-Capillary: 223 mg/dL — ABNORMAL HIGH (ref 70–99)
Glucose-Capillary: 195 mg/dL — ABNORMAL HIGH (ref 70–99)

## 2019-01-29 MED ORDER — PANTOPRAZOLE SODIUM 40 MG PO TBEC
40.0000 mg | DELAYED_RELEASE_TABLET | Freq: Every day | ORAL | Status: DC
  Administered 2019-01-30 – 2019-02-10 (×12): 40 mg via ORAL
  Filled 2019-01-29 (×12): qty 1

## 2019-01-29 NOTE — Progress Notes (Signed)
VASCULAR LAB PRELIMINARY  PRELIMINARY  PRELIMINARY  PRELIMINARY  Right upper extremity venous duplex completed.    Preliminary report:  See CV proc for preliminary results.   Aariona Momon, RVT 01/29/2019, 12:00 PM

## 2019-01-29 NOTE — Progress Notes (Addendum)
PROGRESS NOTE    Phillip Maldonado  [id number] DOB: 1950-04-09 DOA: 01/20/2019 PCP: Patient, No Pcp Per   Brief Narrative:  HPI on 01/20/2019 by Dr. Merton Border [name]  is a 68 y.o. 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 fib, chronic abdominal pain presenting with acute onset of epigastric pain radiating to the right upper chest associated with nausea vomiting and no diarrhea.  The pain was radiating to the right chest and the patient had full work-up in the emergency room including an EKG, troponins CT of abdomen and pelvis with no significant abnormalities noted.  Patient denies any history of blood in his urine or stools.  Patient had subjective fevers early in the morning at 430 when he wake up with the abdominal pain.  Patient is on chronic pain medications but taking MiraLAX as well. Patient denies any cough or shortness of breath. Patient was recently treated for upper GI bleed and case discussed with Dr. Michail Sermon from GI. Work-up in the emergency room showed hyperglycemia otherwise most of his blood work is full and within the normal range when showed colonic diverticulosis  Interim History Patient mated for gallstone pancreatitis.  However he was noted to have septic shock on 01/21/2019 and was transferred to the ICU.  Patient was eventually weaned off of pressors and transferred back to the floor.  He did have MRCP which showed acute pancreatitis, cholelithiasis, no CBD obstruction.  Patient suspected to have gallstone pancreatitis.  General surgery consulted and appreciated, however patient not medically stable for surgical intervention at this time.  Patient was also noted to have worsening creatinine, and nephrology has been seeing him.  Patient needing temporary hemodialysis.  He was then transferred to [facility]. Assessment & Plan   Septic shock likely secondary to acute cholecystitis/ Acute  gallstone pancreatitis -Was noted to be mildly tachycardic and tachypneic on admission, however not febrile, without leukocytosis, and blood pressure was normal at that time -However over the next 24 hours, patient did develop leukocytosis of 22, became hypotensive and required ICU admission for pressors -Patient was also noted to have an elevated lipase as well as LFTs, however these did improve as hospitalization continued. -MRCP showed mild peripancreatic fluid along with pancreatic head/uncinate process likely related to acute pancreatitis.  No fluid collection or pseudocyst.  Cholelithiasis. -HIDA scan was ordered, however refused by patient -Gastroenterology consulted and appreciated, recommended follow-up with Dr. Arelia Longest in 2 to 4 weeks -General surgery was consulted and appreciated, patient was not a surgical candidate at this time given his medical issues.  May consider surgery next week versus outpatient. -Continue ceftriaxone and Flagyl -Blood cultures 01/21/2019: anaerobic bottle only with gram-negative rods- bacteroides vulgatus -Tolerated soft diet   Acute kidney injury -Upon presentation, patient's creatinine was normal -However he developed AKI in the setting of septic shock.  Despite IV hydration, creatinine peaked to 8.26.  -Renal ultrasound showed no obstruction -Urine culture showed no growth -Nephrology consulted and appreciated -PCCM placed central venous catheter -Creatinine currently 7.33  Acute respiratory failure with hypoxia -History of COPD, heart failure, OSA, however not on oxygen supplementation at home -Patient was requiring supplemental oxygen however has now been weaned off, and currently on room air maintaining oxygen saturations in the 90s  Essential hypertension -BP improved -continue amlodipine   Hyperlipidemia -Statin on hold given elevated LFTs  Acute diastolic CHF -Patient does appear to be mildly overloaded, suspect secondary to IV  fluids  given for acute kidney injury and sepsis -Echocardiogram on 01/22/2019 showed an EF of [id number], grade 1 diastolic dysfunction -Suspect fluid status will improve with temporary hemodialysis  Diabetes mellitus, type II -Metformin held -Continue ISS and lantus with CBG monitoring  -Hemoglobin A1c 8.6  GERD/history of GI bleed -Continue PPI- switch to oral  Morbid obesity/sleep apnea -BMI 53.95 -Patient to follow-up with his PCP to discuss lifestyle modifications -patient does not use CPAP at home  Left knee pain -Patient was supposed to have some type of procedure few months ago however this was delayed as his wife had Covid. -Orthopedics consulted and appreciated, status post left knee aspiration and injection (under 90 mL of clear fluid was obtained, 6 mL of 0.5% Marcaine and 80 mg of Depo-Medrol injected)  Right UE edema -possibly due to overload from IVF -will obtain UE doppler to rule out DVT -have asked for patient to elevate arm  DVT Prophylaxis Heparin  Code Status: Full  Family Communication: None at bedside  Disposition Plan: Admitted.  Currently pending improvement in renal function as well as possible surgical intervention this admission.  Disposition to be determined.  Consultants PCCM General surgery Nephrology Gastroenterology Orthopedic surgery  Procedures  Echocardiogram Insertion of central venous catheter Renal ultrasound MRCP Left knee aspiration and injection  Antibiotics   Anti-infectives (From admission, onward)   Start     Dose/Rate Route Frequency Ordered Stop   01/23/19 1400  piperacillin-tazobactam (ZOSYN) IVPB 2.25 g  Status:  Discontinued     2.25 g 100 mL/hr over 30 Minutes Intravenous Every 8 hours 01/23/19 0717 01/23/19 0947   01/23/19 1400  cefTRIAXone (ROCEPHIN) 2 g in sodium chloride 0.9 % 100 mL IVPB     2 g 200 mL/hr over 30 Minutes Intravenous Every 24 hours 01/23/19 0947     01/23/19 1400  metroNIDAZOLE (FLAGYL) IVPB 500 mg      500 mg 100 mL/hr over 60 Minutes Intravenous Every 8 hours 01/23/19 0947     01/21/19 1400  piperacillin-tazobactam (ZOSYN) IVPB 3.375 g  Status:  Discontinued     3.375 g 12.5 mL/hr over 240 Minutes Intravenous Every 8 hours 01/21/19 1325 01/23/19 [id number]      Subjective:   Phillip Maldonado seen and examined today.  No new complaints this morning.  Denies chest pain, shortness of breath, abdominal pain, nausea or vomiting, diarrhea or constipation, dizziness or headache.  Continues to have some knee pain but feels it is improved.  Objective:   Vitals:   01/28/19 1300 01/28/19 2236 01/29/19 0518 01/29/19 0622  BP: (!) 164/87 (!) 162/88  (!) 145/80  Pulse: 79 66  65  Resp: 20 20  17   Temp: 97.8 F (36.6 C) (!) 97.5 F (36.4 C)  (!) 97.5 F (36.4 C)  TempSrc: Oral Oral  Oral  SpO2: 97% 93%  93%  Weight:   (!) 163.3 kg   Height:        Intake/Output Summary (Last 24 hours) at 01/29/2019 1048 Last data filed at 01/29/2019 0406 Gross per 24 hour  Intake 680 ml  Output 1350 ml  Net -670 ml   Filed Weights   01/27/19 1135 01/27/19 1200 01/29/19 0518  Weight: (!) 162.2 kg (!) 161.5 kg (!) 163.3 kg   Exam  General: Well developed, well nourished, NAD, appears stated age  76: NCAT, mucous membranes moist.   Cardiovascular: S1 S2 auscultated, RRR, no murmur  Respiratory: Clear to auscultation bilaterally  Abdomen: Soft, obese, nontender,  nondistended, + bowel sounds  Extremities: warm dry without cyanosis clubbing. RUE edema. LE edema improving   Neuro: AAOx3, cranial nerves grossly intact.  Psych: Pleasant, appropriate mood and affect  Data Reviewed: I have personally reviewed following labs and imaging studies  CBC: Recent Labs  Lab 01/23/19 0340 01/24/19 0150 01/25/19 0143 01/27/19 0429  WBC 14.1* 11.6* 11.2* 11.2*  NEUTROABS 11.8* 9.1* 8.7*  --   HGB 11.2* 10.9* 11.5* 11.2*  HCT 36.2* 34.8* 36.7* 35.0*  MCV 92.3 90.4 91.3 88.8  PLT 178 197 214 [id number]    Basic Metabolic Panel: Recent Labs  Lab 01/23/19 0340 01/24/19 0150 01/25/19 0143 01/26/19 0437 01/27/19 0429 01/28/19 0428 01/29/19 0329  NA 135 134* 134* 136 133* 135 136  K 4.0 4.0 4.3 4.3 4.6 4.3 4.5  CL 99 97* 98 99 98 98 100  CO2 25 22 21* 20* 19* 21* 21*  GLUCOSE 152* 161* 175* 187* 194* 188* 226*  BUN 52* 63* 75* 54* 70* 57* 76*  CREATININE 5.25* 6.69* 8.26* 7.31* 8.40* 6.74* 7.33*  CALCIUM 8.0* 8.1* 8.3* 8.3* 8.3* 8.4* 8.5*  MG 1.8 1.9 2.0  --   --   --   --   PHOS 3.7 4.0 4.8*  --   --  6.1* 7.9*   GFR: Estimated Creatinine Clearance: 14.9 mL/min (A) (by C-G formula based on SCr of 7.33 mg/dL (H)). Liver Function Tests: Recent Labs  Lab 01/23/19 0340 01/24/19 0150 01/25/19 0143 01/27/19 0429 01/28/19 0428 01/29/19 0329  AST 102* 54* 37 20  --   --   ALT 130* 87* 65* 32  --   --   ALKPHOS 92 107 122 115  --   --   BILITOT 2.3* 1.3* 1.2 1.2  --   --   PROT 6.3* 6.4* 6.8 6.8  --   --   ALBUMIN 2.6* 2.4* 2.6* 2.0* 2.1* 2.0*   Recent Labs  Lab 01/23/19 0500 01/24/19 0150  LIPASE 79* 38   No results for input(s): AMMONIA in the last 168 hours. Coagulation Profile: No results for input(s): INR, PROTIME in the last 168 hours. Cardiac Enzymes: No results for input(s): CKTOTAL, CKMB, CKMBINDEX, TROPONINI in the last 168 hours. BNP (last 3 results) No results for input(s): PROBNP in the last 8760 hours. HbA1C: No results for input(s): HGBA1C in the last 72 hours. CBG: Recent Labs  Lab 01/28/19 0714 01/28/19 1253 01/28/19 1703 01/28/19 2239 01/29/19 0832  GLUCAP 202* 185* 190* 296* 189*   Lipid Profile: No results for input(s): CHOL, HDL, LDLCALC, TRIG, CHOLHDL, LDLDIRECT in the last 72 hours. Thyroid Function Tests: No results for input(s): TSH, T4TOTAL, FREET4, T3FREE, THYROIDAB in the last 72 hours. Anemia Panel: No results for input(s): VITAMINB12, FOLATE, FERRITIN, TIBC, IRON, RETICCTPCT in the last 72 hours. Urine analysis:    Component  Value Date/Time   COLORURINE RED (A) 01/23/2019 1402   APPEARANCEUR TURBID (A) 01/23/2019 1402   LABSPEC  01/23/2019 1402    TEST NOT REPORTED DUE TO COLOR INTERFERENCE OF URINE PIGMENT   PHURINE  01/23/2019 1402    TEST NOT REPORTED DUE TO COLOR INTERFERENCE OF URINE PIGMENT   GLUCOSEU (A) 01/23/2019 1402    TEST NOT REPORTED DUE TO COLOR INTERFERENCE OF URINE PIGMENT   HGBUR (A) 01/23/2019 1402    TEST NOT REPORTED DUE TO COLOR INTERFERENCE OF URINE PIGMENT   BILIRUBINUR (A) 01/23/2019 1402    TEST NOT REPORTED DUE TO COLOR INTERFERENCE OF URINE PIGMENT  BILIRUBINUR Large 12/30/2015 1600   KETONESUR (A) 01/23/2019 1402    TEST NOT REPORTED DUE TO COLOR INTERFERENCE OF URINE PIGMENT   PROTEINUR (A) 01/23/2019 1402    TEST NOT REPORTED DUE TO COLOR INTERFERENCE OF URINE PIGMENT   UROBILINOGEN 0.2 12/30/2015 1600   UROBILINOGEN 1.0 09/15/2013 1855   NITRITE (A) 01/23/2019 1402    TEST NOT REPORTED DUE TO COLOR INTERFERENCE OF URINE PIGMENT   LEUKOCYTESUR (A) 01/23/2019 1402    TEST NOT REPORTED DUE TO COLOR INTERFERENCE OF URINE PIGMENT   Sepsis Labs: @LABRCNTIP (procalcitonin:4,lacticidven:4)  ) Recent Results (from the past 240 hour(s))  SARS CORONAVIRUS 2 (TAT 6-24 HRS) Nasopharyngeal Nasopharyngeal Swab     Status: None   Collection Time: 01/20/19  6:20 PM   Specimen: Nasopharyngeal Swab  Result Value Ref Range Status   SARS Coronavirus 2 NEGATIVE NEGATIVE Final    Comment: (NOTE) SARS-CoV-2 target nucleic acids are NOT DETECTED. The SARS-CoV-2 RNA is generally detectable in upper and lower respiratory specimens during the acute phase of infection. Negative results do not preclude SARS-CoV-2 infection, do not rule out co-infections with other pathogens, and should not be used as the sole basis for treatment or other patient management decisions. Negative results must be combined with clinical observations, patient history, and epidemiological information. The expected  result is Negative. Fact Sheet for Patients: SugarRoll.be Fact Sheet for Healthcare Providers: https://www.woods-mathews.com/ This test is not yet approved or cleared by the [place] FDA and  has been authorized for detection and/or diagnosis of SARS-CoV-2 by FDA under an Emergency Use Authorization (EUA). This EUA will remain  in effect (meaning this test can be used) for the duration of the COVID-19 declaration under Section 56 4(b)(1) of the Act, 21 U.S.C. section 360bbb-3(b)(1), unless the authorization is terminated or revoked sooner. Performed at [facility], [place] [address], [place], [place] [address]   Culture, Urine     Status: Abnormal   Collection Time: 01/21/19  1:03 PM   Specimen: Urine, Random  Result Value Ref Range Status   Specimen Description   Final    URINE, RANDOM Performed at [place] [address], [place], [place] [address]    Special Requests   Final    NONE Performed at [facility], [place] [address], [place], [place] [address]    Culture (A)  Final    40,000 COLONIES/mL MULTIPLE SPECIES PRESENT, SUGGEST RECOLLECTION   Report Status 01/23/2019 FINAL  Final  Culture, blood (routine x 2)     Status: None   Collection Time: 01/21/19  1:32 PM   Specimen: BLOOD  Result Value Ref Range Status   Specimen Description   Final    BLOOD RIGHT ANTECUBITAL Performed at [facility], [place] [address] [place],  [address]    Special Requests   Final    BOTTLES DRAWN AEROBIC AND ANAEROBIC Blood Culture adequate volume Performed at [place] [address] [place],  [address]    Culture   Final    NO GROWTH 5 DAYS Performed at [facility], [place] [address], [place],  [address]    Report Status 01/26/2019 FINAL  Final  Culture, blood (routine x 2)     Status: Abnormal   Collection Time: 01/21/19  1:33 PM    Specimen: BLOOD  Result Value Ref Range Status   Specimen Description   Final    BLOOD RIGHT HAND Performed at [facility]  [place] [address] [place], [place] [address]    Special Requests   Final    BOTTLES DRAWN AEROBIC AND ANAEROBIC Blood Culture adequate volume Performed at [place] [address] [place], [place] [address]    Culture  Setup Time   Final    ANAEROBIC BOTTLE ONLY GRAM NEGATIVE RODS CRITICAL RESULT CALLED TO, READ BACK BY AND VERIFIED WITH: Irwin Brakeman [id number] 01/24/2019 T. TYSOR    Culture (A)  Final    BACTEROIDES VULGATUS BETA LACTAMASE POSITIVE Performed at [facility], [place] [address] [place], [place] [address]    Report Status 01/26/2019 FINAL  Final  Blood Culture ID Panel (Reflexed)     Status: None   Collection Time: 01/21/19  1:33 PM  Result Value Ref Range Status   Enterococcus species NOT DETECTED NOT DETECTED Final   Listeria monocytogenes NOT DETECTED NOT DETECTED Final   Staphylococcus species NOT DETECTED NOT DETECTED Final   Staphylococcus aureus (BCID) NOT DETECTED NOT DETECTED Final   Streptococcus species NOT DETECTED NOT DETECTED Final   Streptococcus agalactiae NOT DETECTED NOT DETECTED Final   Streptococcus pneumoniae NOT DETECTED NOT DETECTED Final   Streptococcus pyogenes NOT DETECTED NOT DETECTED Final   Acinetobacter baumannii NOT DETECTED NOT DETECTED Final   Enterobacteriaceae species NOT DETECTED NOT DETECTED Final   Enterobacter cloacae complex NOT DETECTED NOT DETECTED Final   Escherichia coli NOT DETECTED NOT DETECTED Final   Klebsiella oxytoca NOT DETECTED NOT DETECTED Final   Klebsiella pneumoniae NOT DETECTED NOT DETECTED Final   Proteus species NOT DETECTED NOT DETECTED Final   Serratia marcescens NOT DETECTED NOT DETECTED Final   Haemophilus influenzae NOT DETECTED NOT DETECTED Final   Neisseria meningitidis NOT DETECTED NOT DETECTED Final   Pseudomonas aeruginosa  NOT DETECTED NOT DETECTED Final   Candida albicans NOT DETECTED NOT DETECTED Final   Candida glabrata NOT DETECTED NOT DETECTED Final   Candida krusei NOT DETECTED NOT DETECTED Final   Candida parapsilosis NOT DETECTED NOT DETECTED Final   Candida tropicalis NOT DETECTED NOT DETECTED Final    Comment: Performed at [facility], [address] [place], [place] [address]  MRSA PCR Screening     Status: None   Collection Time: 01/21/19  7:28 PM   Specimen: Nasal Mucosa; Nasopharyngeal  Result Value Ref Range Status   MRSA by PCR NEGATIVE NEGATIVE Final    Comment:        The GeneXpert MRSA Assay (FDA approved for NASAL specimens only), is one component of a comprehensive MRSA colonization surveillance program. It is not intended to diagnose MRSA infection nor to guide or monitor treatment for MRSA infections. Performed at [place], [place] [address], [place], [place] [address]   Culture, Urine     Status: None   Collection Time: 01/23/19  2:02 PM   Specimen: Urine, Random  Result Value Ref Range Status   Specimen Description   Final    URINE, RANDOM Performed at [place] [address], [place], [place] [address]    Special Requests   Final    NONE Performed at [facility], [place] [address] [place], [place] [address]    Culture   Final    NO GROWTH Performed at [facility], [place] [address] [place] [address], [place], [place] [address]    Report Status 01/24/2019 FINAL  Final  Body fluid culture     Status: None  Collection Time: 01/26/19 12:43 PM   Specimen: Body Fluid  Result Value Ref Range Status   Specimen Description FLUID  Final   Special Requests SYNOVIAL KNEE  Final   Gram Stain   Final    WBC PRESENT,BOTH PMN AND MONONUCLEAR NO ORGANISMS SEEN CYTOSPIN SMEAR    Culture   Final    NO GROWTH 3 DAYS Performed at [facility], [address] [place], [place] [address]    Report Status 01/29/2019  FINAL  Final      Radiology Studies: No results found.   Scheduled Meds: . amLODipine  5 mg Oral Daily  . Chlorhexidine Gluconate Cloth  6 each Topical Daily  . Chlorhexidine Gluconate Cloth  6 each Topical Q0600  . Chlorhexidine Gluconate Cloth  6 each Topical Q0600  . DULoxetine  60 mg Oral Daily  . heparin injection (subcutaneous)  5,000 Units Subcutaneous Q8H  . insulin aspart  0-5 Units Subcutaneous QHS  . insulin aspart  0-9 Units Subcutaneous TID WC  . insulin glargine  20 Units Subcutaneous Q2200  . mouth rinse  15 mL Mouth Rinse BID  . metoprolol succinate  100 mg Oral Daily  . pantoprazole (PROTONIX) IV  40 mg Intravenous Q12H  . predniSONE  40 mg Oral Q breakfast  . simethicone  80 mg Oral QID  . sodium chloride flush  10-40 mL Intracatheter Q12H  . thiamine injection  100 mg Intravenous Daily  . traZODone  300 mg Oral QHS   Continuous Infusions: . cefTRIAXone (ROCEPHIN)  IV 2 g (01/28/19 1308)  . metronidazole 500 mg (01/29/19 0802)     LOS: 9 days   Time Spent in minutes   45 minutes  Hugo Lybrand D.O. on 01/29/2019 at 10:48 AM  Between 7am to 7pm - Please see pager noted on [email or web address]  After 7pm go to [email or web address]  And look for the night coverage person covering for me after hours  [facility] Office  [phone]

## 2019-01-29 NOTE — Progress Notes (Signed)
[facility] Progress Note  Subjective: creat up 7.3 this am, 1.2 L urine output yesterday, no new c/o's. Alert, no sob or N/V.   Vitals:   01/28/19 1300 01/28/19 2236 01/29/19 0518 01/29/19 0622  BP: (!) 164/87 (!) 162/88  (!) 145/80  Pulse: 79 66  65  Resp: 20 20  17   Temp: 97.8 F (36.6 C) (!) 97.5 F (36.4 C)  (!) 97.5 F (36.4 C)  TempSrc: Oral Oral  Oral  SpO2: 97% 93%  93%  Weight:   (!) 163.3 kg   Height:        Inpatient medications: . amLODipine  5 mg Oral Daily  . Chlorhexidine Gluconate Cloth  6 each Topical Daily  . Chlorhexidine Gluconate Cloth  6 each Topical Q0600  . Chlorhexidine Gluconate Cloth  6 each Topical Q0600  . DULoxetine  60 mg Oral Daily  . heparin injection (subcutaneous)  5,000 Units Subcutaneous Q8H  . insulin aspart  0-5 Units Subcutaneous QHS  . insulin aspart  0-9 Units Subcutaneous TID WC  . insulin glargine  20 Units Subcutaneous Q2200  . mouth rinse  15 mL Mouth Rinse BID  . metoprolol succinate  100 mg Oral Daily  . [START ON 01/30/2019] pantoprazole  40 mg Oral Daily  . predniSONE  40 mg Oral Q breakfast  . simethicone  80 mg Oral QID  . sodium chloride flush  10-40 mL Intracatheter Q12H  . thiamine injection  100 mg Intravenous Daily  . traZODone  300 mg Oral QHS   . cefTRIAXone (ROCEPHIN)  IV 2 g (01/28/19 1308)  . metronidazole 500 mg (01/29/19 0802)   bisacodyl, heparin, HYDROmorphone (DILAUDID) injection, hydrOXYzine, levalbuterol, ondansetron **OR** ondansetron (ZOFRAN) IV, oxyCODONE-acetaminophen, polyethylene glycol, sodium chloride flush    Exam: Gen alert, on distress No rash, cyanosis or gangrene Sclera anicteric, throat clear  +JVD Chest R clear, L base bronch BS RRR no MRG Abd soft ntnd no mass or ascites +bs GU normal male w foley in place defe MS no joint effusions or deformity Ext bilat LE edema 2+ and UE 2+ Neuro is alert, Ox 3 , nf       Home meds:   - metoprolol 50 bid/ metolazone  2.5 qd/ lisinopril 40 qd/ torsemide 100 bid/ amlodipine 10/ losartan 100/ amlodipine  - methadone 10 tid prn/ naloxegol oxalate 25 qd  - insulin glargine 20 hs/ ? Metformin   - gabapentin 300 tid prn/ duloxetine 60 bid/ trazodone 300 hs/ ? lyrica  - pantoprazole 40 qd/ atorvastatin 40   UA turbid, red, >50 rbc and 11-20 wbc   UN 65  UCr 114  Renal US > IMPRESSION:    Limited visualization of the kidneys due to body habitus. No obstruction. Normal renal size. Urinary bladder empty with Foley catheter.  Assessment/ Plan: 1. Acute renal failure - combination of IV contrast/ hypotension-shock w/  ARB/ACEi on board. Creat worsened and is now sp HD x 2. Cont to hold HD to see if recovering. No need HD today. He is vol overloaded so IVF's dc'd.  2. Septic shock/ abd pain - resolved, suspected gallstone pancreatitis, taking solid food now. Any procedures on hold for #1.  3. HTN -long hx 4. Chronic pain 5. Depression 6. Chronic CHF - lungs clear by latest CXR 7. Vol excess - peripheral edema 2+, as above, tolerating well      Rob Jordanny Waddington 01/29/2019, 12:03 PM  Iron/TIBC/Ferritin/ %Sat    Component Value Date/Time  IRON 35 (L) 09/16/2013 0243   TIBC 397 09/16/2013 0243   FERRITIN 11 (L) 09/16/2013 0243   IRONPCTSAT 9 (L) 09/16/2013 0243   Recent Labs  Lab 01/29/19 0329  NA 136  K 4.5  CL 100  CO2 21*  GLUCOSE 226*  BUN 76*  CREATININE 7.33*  CALCIUM 8.5*  PHOS 7.9*  ALBUMIN 2.0*   Recent Labs  Lab 01/27/19 0429  AST 20  ALT 32  ALKPHOS 115  BILITOT 1.2  PROT 6.8   Recent Labs  Lab 01/27/19 0429  WBC 11.2*  HGB 11.2*  HCT 35.0*  PLT 271

## 2019-01-30 LAB — HEMOGLOBIN AND HEMATOCRIT, BLOOD
HCT: 39.8 % (ref 39.0–52.0)
Hemoglobin: 12.5 g/dL — ABNORMAL LOW (ref 13.0–17.0)

## 2019-01-30 LAB — GLUCOSE, CAPILLARY
Glucose-Capillary: 188 mg/dL — ABNORMAL HIGH (ref 70–99)
Glucose-Capillary: 255 mg/dL — ABNORMAL HIGH (ref 70–99)
Glucose-Capillary: 313 mg/dL — ABNORMAL HIGH (ref 70–99)
Glucose-Capillary: 311 mg/dL — ABNORMAL HIGH (ref 70–99)

## 2019-01-30 LAB — RENAL FUNCTION PANEL
Albumin: 2 g/dL — ABNORMAL LOW (ref 3.5–5.0)
Anion gap: 18 — ABNORMAL HIGH (ref 5–15)
BUN: 93 mg/dL — ABNORMAL HIGH (ref 8–23)
CO2: 19 mmol/L — ABNORMAL LOW (ref 22–32)
Calcium: 8.5 mg/dL — ABNORMAL LOW (ref 8.9–10.3)
Chloride: 99 mmol/L (ref 98–111)
Creatinine, Ser: 7.57 mg/dL — ABNORMAL HIGH (ref 0.61–1.24)
GFR calc Af Amer: 8 mL/min — ABNORMAL LOW (ref >60)
GFR calc non Af Amer: 7 mL/min — ABNORMAL LOW (ref >60)
Glucose, Bld: 211 mg/dL — ABNORMAL HIGH (ref 70–99)
Phosphorus: 8.8 mg/dL — ABNORMAL HIGH (ref 2.5–4.6)
Potassium: 4.1 mmol/L (ref 3.5–5.1)
Sodium: 136 mmol/L (ref 135–145)

## 2019-01-30 MED ORDER — VITAMIN B-1 100 MG PO TABS
100.0000 mg | ORAL_TABLET | Freq: Every day | ORAL | Status: DC
  Administered 2019-01-30 – 2019-02-10 (×12): 100 mg via ORAL
  Filled 2019-01-30 (×12): qty 1

## 2019-01-30 MED ORDER — FUROSEMIDE 10 MG/ML IJ SOLN
80.0000 mg | Freq: Once | INTRAMUSCULAR | Status: AC
  Administered 2019-01-30: 80 mg via INTRAVENOUS
  Filled 2019-01-30: qty 8

## 2019-01-30 MED ORDER — PREDNISONE 20 MG PO TABS
20.0000 mg | ORAL_TABLET | Freq: Every day | ORAL | Status: DC
  Administered 2019-01-31 – 2019-02-03 (×4): 20 mg via ORAL
  Filled 2019-01-30 (×4): qty 1

## 2019-01-30 NOTE — Progress Notes (Signed)
PROGRESS NOTE    Phillip Maldonado  [name] DOB: Oct 04, 1950 DOA: 01/20/2019 PCP: Patient, No Pcp Per   Brief Narrative:  HPI on 01/20/2019 by Dr. Merton Border Phillip Maldonado  is a 68 y.o. 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 fib, chronic abdominal pain presenting with acute onset of epigastric pain radiating to the right upper chest associated with nausea vomiting and no diarrhea.  The pain was radiating to the right chest and the patient had full work-up in the emergency room including an EKG, troponins CT of abdomen and pelvis with no significant abnormalities noted.  Patient denies any history of blood in his urine or stools.  Patient had subjective fevers early in the morning at 430 when he wake up with the abdominal pain.  Patient is on chronic pain medications but taking MiraLAX as well. Patient denies any cough or shortness of breath. Patient was recently treated for upper GI bleed and case discussed with Dr. Michail Sermon from GI. Work-up in the emergency room showed hyperglycemia otherwise most of his blood work is full and within the normal range when showed colonic diverticulosis  Interim History Patient mated for gallstone pancreatitis.  However he was noted to have septic shock on 01/21/2019 and was transferred to the ICU.  Patient was eventually weaned off of pressors and transferred back to the floor.  He did have MRCP which showed acute pancreatitis, cholelithiasis, no CBD obstruction.  Patient suspected to have gallstone pancreatitis.  General surgery consulted and appreciated, however patient not medically stable for surgical intervention at this time.  Patient was also noted to have worsening creatinine, and nephrology has been seeing him.  Patient needing temporary hemodialysis.  He was then transferred to [facility]. Assessment & Plan   Septic shock likely secondary to acute cholecystitis/ Acute  gallstone pancreatitis -Was noted to be mildly tachycardic and tachypneic on admission, however not febrile, without leukocytosis, and blood pressure was normal at that time -However over the next 24 hours, patient did develop leukocytosis of 22, became hypotensive and required ICU admission for pressors -Patient was also noted to have an elevated lipase as well as LFTs, however these did improve as hospitalization continued. -MRCP showed mild peripancreatic fluid along with pancreatic head/uncinate process likely related to acute pancreatitis.  No fluid collection or pseudocyst.  Cholelithiasis. -HIDA scan was ordered, however refused by patient -Gastroenterology consulted and appreciated, recommended follow-up with Dr. Arelia Longest in 2 to 4 weeks  -Continue ceftriaxone and Flagyl -Blood cultures 01/21/2019: anaerobic bottle only with gram-negative rods- bacteroides vulgatus -Patient complaining of some mild abdominal pain this morning, will transition back to clear liquid diet. -General surgery still following, and feels the patient may be a candidate for surgery later this week  Acute kidney injury -Upon presentation, patient's creatinine was normal -However he developed AKI in the setting of septic shock.  Despite IV hydration, creatinine peaked to 8.26.  -Renal ultrasound showed no obstruction -Urine culture showed no growth -Nephrology consulted and appreciated -PCCM placed central venous catheter -Creatinine currently 7.57 -Discussed with nephrology, holding off on hemodialysis today however patient does appear to be volume overloaded  Acute respiratory failure with hypoxia -History of COPD, heart failure, OSA, however not on oxygen supplementation at home -Patient was requiring supplemental oxygen however has now been weaned off, and currently on room air maintaining oxygen saturations in the 90s  Essential hypertension -BP improved -continue amlodipine   Hyperlipidemia -Statin on  hold given elevated LFTs  Acute diastolic CHF -Patient does appear to be mildly overloaded, suspect secondary to IV fluids given for acute kidney injury and sepsis -Echocardiogram on 01/22/2019 showed an EF of [id number], grade 1 diastolic dysfunction -Suspect fluid status will improve with temporary hemodialysis  Diabetes mellitus, type II -Metformin held -Continue ISS and lantus with CBG monitoring  -Hemoglobin A1c 8.6  GERD/history of GI bleed -Continue PPI- switch to oral  Morbid obesity/sleep apnea -BMI 53.95 -Patient to follow-up with his PCP to discuss lifestyle modifications -patient does not use CPAP at home  Left knee pain -Patient was supposed to have some type of procedure few months ago however this was delayed as his wife had Covid. -Orthopedics consulted and appreciated, status post left knee aspiration and injection (under 90 mL of clear fluid was obtained, 6 mL of 0.5% Marcaine and 80 mg of Depo-Medrol injected)  Right UE edema -possibly due to overload from IVF -Upper extremity Doppler was negative for right DVT -have asked for patient to elevate arm  DVT Prophylaxis Heparin  Code Status: Full  Family Communication: None at bedside  Disposition Plan: Admitted.  Currently pending improvement in renal function as well as possible surgical intervention this admission.  Disposition to be determined.  Consultants PCCM General surgery Nephrology Gastroenterology Orthopedic surgery  Procedures  Echocardiogram Insertion of central venous catheter Renal ultrasound MRCP Left knee aspiration and injection Right upper extremity Doppler  Antibiotics   Anti-infectives (From admission, onward)   Start     Dose/Rate Route Frequency Ordered Stop   01/23/19 1400  piperacillin-tazobactam (ZOSYN) IVPB 2.25 g  Status:  Discontinued     2.25 g 100 mL/hr over 30 Minutes Intravenous Every 8 hours 01/23/19 0717 01/23/19 0947   01/23/19 1400  cefTRIAXone (ROCEPHIN) 2 g in  sodium chloride 0.9 % 100 mL IVPB     2 g 200 mL/hr over 30 Minutes Intravenous Every 24 hours 01/23/19 0947     01/23/19 1400  metroNIDAZOLE (FLAGYL) IVPB 500 mg     500 mg 100 mL/hr over 60 Minutes Intravenous Every 8 hours 01/23/19 0947     01/21/19 1400  piperacillin-tazobactam (ZOSYN) IVPB 3.375 g  Status:  Discontinued     3.375 g 12.5 mL/hr over 240 Minutes Intravenous Every 8 hours 01/21/19 1325 01/23/19 [id number]      Subjective:   Phillip Maldonado seen and examined today.  Patient currently having mild abdominal pain.  Able to have bowel movement today.  Denies current chest pain or shortness of breath, nausea or vomiting, dizziness or headache.  He is to complain of right hand swelling.  Objective:   Vitals:   01/29/19 0622 01/29/19 1710 01/29/19 2108 01/30/19 0537  BP: (!) 145/80 (!) 172/89 (!) 145/83 (!) 149/75  Pulse: 65 64 62 61  Resp: 17 20 16 16   Temp: (!) 97.5 F (36.4 C) (!) 97.4 F (36.3 C) (!) 97.5 F (36.4 C) 98.7 F (37.1 C)  TempSrc: Oral Oral Oral   SpO2: 93% 92% 95% 93%  Weight:    (!) 163.3 kg  Height:        Intake/Output Summary (Last 24 hours) at 01/30/2019 1125 Last data filed at 01/30/2019 0900 Gross per 24 hour  Intake 920 ml  Output 2050 ml  Net -1130 ml   Filed Weights   01/27/19 1200 01/29/19 0518 01/30/19 0537  Weight: (!) 161.5 kg (!) 163.3 kg (!) 163.3 kg   Exam  General: Well developed, ill-appearing, NAD  HEENT: NCAT, mucous membranes moist.   Cardiovascular: S1 S2 auscultated, RRR, no murmur  Respiratory: Clear to auscultation bilaterally   Abdomen: Soft, nontender, nondistended, + bowel sounds  Extremities: warm dry without cyanosis clubbing.  RUE edema, B/L LE edema  Neuro: AAOx3, nonfocal  Psych: Appropriate mood and affect, pleasant  Data Reviewed: I have personally reviewed following labs and imaging studies  CBC: Recent Labs  Lab 01/24/19 0150 01/25/19 0143 01/27/19 0429 01/30/19 0306  WBC 11.6* 11.2*  11.2*  --   NEUTROABS 9.1* 8.7*  --   --   HGB 10.9* 11.5* 11.2* 12.5*  HCT 34.8* 36.7* 35.0* 39.8  MCV 90.4 91.3 88.8  --   PLT 197 214 271  --    Basic Metabolic Panel: Recent Labs  Lab 01/24/19 0150 01/25/19 0143 01/26/19 0437 01/27/19 0429 01/28/19 0428 01/29/19 0329 01/30/19 0306  NA 134* 134* 136 133* 135 136 136  K 4.0 4.3 4.3 4.6 4.3 4.5 4.1  CL 97* 98 99 98 98 100 99  CO2 22 21* 20* 19* 21* 21* 19*  GLUCOSE 161* 175* 187* 194* 188* 226* 211*  BUN 63* 75* 54* 70* 57* 76* 93*  CREATININE 6.69* 8.26* 7.31* 8.40* 6.74* 7.33* 7.57*  CALCIUM 8.1* 8.3* 8.3* 8.3* 8.4* 8.5* 8.5*  MG 1.9 2.0  --   --   --   --   --   PHOS 4.0 4.8*  --   --  6.1* 7.9* 8.8*   GFR: Estimated Creatinine Clearance: 14.4 mL/min (A) (by C-G formula based on SCr of 7.57 mg/dL (H)). Liver Function Tests: Recent Labs  Lab 01/24/19 0150 01/25/19 0143 01/27/19 0429 01/28/19 0428 01/29/19 0329 01/30/19 0306  AST 54* 37 20  --   --   --   ALT 87* 65* 32  --   --   --   ALKPHOS 107 122 115  --   --   --   BILITOT 1.3* 1.2 1.2  --   --   --   PROT 6.4* 6.8 6.8  --   --   --   ALBUMIN 2.4* 2.6* 2.0* 2.1* 2.0* 2.0*   Recent Labs  Lab 01/24/19 0150  LIPASE 38   No results for input(s): AMMONIA in the last 168 hours. Coagulation Profile: No results for input(s): INR, PROTIME in the last 168 hours. Cardiac Enzymes: No results for input(s): CKTOTAL, CKMB, CKMBINDEX, TROPONINI in the last 168 hours. BNP (last 3 results) No results for input(s): PROBNP in the last 8760 hours. HbA1C: No results for input(s): HGBA1C in the last 72 hours. CBG: Recent Labs  Lab 01/28/19 2239 01/29/19 0832 01/29/19 1152 01/29/19 1712 01/29/19 2109  GLUCAP 296* 189* 206* 223* 195*   Lipid Profile: No results for input(s): CHOL, HDL, LDLCALC, TRIG, CHOLHDL, LDLDIRECT in the last 72 hours. Thyroid Function Tests: No results for input(s): TSH, T4TOTAL, FREET4, T3FREE, THYROIDAB in the last 72 hours. Anemia  Panel: No results for input(s): VITAMINB12, FOLATE, FERRITIN, TIBC, IRON, RETICCTPCT in the last 72 hours. Urine analysis:    Component Value Date/Time   COLORURINE RED (A) 01/23/2019 1402   APPEARANCEUR TURBID (A) 01/23/2019 1402   LABSPEC  01/23/2019 1402    TEST NOT REPORTED DUE TO COLOR INTERFERENCE OF URINE PIGMENT   PHURINE  01/23/2019 1402    TEST NOT REPORTED DUE TO COLOR INTERFERENCE OF URINE PIGMENT   GLUCOSEU (A) 01/23/2019 1402    TEST NOT REPORTED DUE TO COLOR INTERFERENCE OF URINE PIGMENT  HGBUR (A) 01/23/2019 1402    TEST NOT REPORTED DUE TO COLOR INTERFERENCE OF URINE PIGMENT   BILIRUBINUR (A) 01/23/2019 1402    TEST NOT REPORTED DUE TO COLOR INTERFERENCE OF URINE PIGMENT   BILIRUBINUR Large 12/30/2015 1600   KETONESUR (A) 01/23/2019 1402    TEST NOT REPORTED DUE TO COLOR INTERFERENCE OF URINE PIGMENT   PROTEINUR (A) 01/23/2019 1402    TEST NOT REPORTED DUE TO COLOR INTERFERENCE OF URINE PIGMENT   UROBILINOGEN 0.2 12/30/2015 1600   UROBILINOGEN 1.0 09/15/2013 1855   NITRITE (A) 01/23/2019 1402    TEST NOT REPORTED DUE TO COLOR INTERFERENCE OF URINE PIGMENT   LEUKOCYTESUR (A) 01/23/2019 1402    TEST NOT REPORTED DUE TO COLOR INTERFERENCE OF URINE PIGMENT   Sepsis Labs: @LABRCNTIP (procalcitonin:4,lacticidven:4)  ) Recent Results (from the past 240 hour(s))  SARS CORONAVIRUS 2 (TAT 6-24 HRS) Nasopharyngeal Nasopharyngeal Swab     Status: None   Collection Time: 01/20/19  6:20 PM   Specimen: Nasopharyngeal Swab  Result Value Ref Range Status   SARS Coronavirus 2 NEGATIVE NEGATIVE Final    Comment: (NOTE) SARS-CoV-2 target nucleic acids are NOT DETECTED. The SARS-CoV-2 RNA is generally detectable in upper and lower respiratory specimens during the acute phase of infection. Negative results do not preclude SARS-CoV-2 infection, do not rule out co-infections with other pathogens, and should not be used as the sole basis for treatment or other patient management  decisions. Negative results must be combined with clinical observations, patient history, and epidemiological information. The expected result is Negative. Fact Sheet for Patients: SugarRoll.be Fact Sheet for Healthcare Providers: https://www.woods-mathews.com/ This test is not yet approved or cleared by the [place] FDA and  has been authorized for detection and/or diagnosis of SARS-CoV-2 by FDA under an Emergency Use Authorization (EUA). This EUA will remain  in effect (meaning this test can be used) for the duration of the COVID-19 declaration under Section 56 4(b)(1) of the Act, 21 U.S.C. section 360bbb-3(b)(1), unless the authorization is terminated or revoked sooner. Performed at [facility], [place] [address] [place], [place] [address]   Culture, Urine     Status: Abnormal   Collection Time: 01/21/19  1:03 PM   Specimen: Urine, Random  Result Value Ref Range Status   Specimen Description   Final    URINE, RANDOM Performed at [facility] [address], [place], [place] [address]    Special Requests   Final    NONE Performed at [facility], [place] [address] [place], [place] [address]    Culture (A)  Final    40,000 COLONIES/mL MULTIPLE SPECIES PRESENT, SUGGEST RECOLLECTION   Report Status 01/23/2019 FINAL  Final  Culture, blood (routine x 2)     Status: None   Collection Time: 01/21/19  1:32 PM   Specimen: BLOOD  Result Value Ref Range Status   Specimen Description   Final    BLOOD RIGHT ANTECUBITAL Performed at [facility], [place] [address], [place], [place] [address]    Special Requests   Final    BOTTLES DRAWN AEROBIC AND ANAEROBIC Blood Culture adequate volume Performed at [place] [address], [place], [place] [address]    Culture   Final    NO GROWTH 5 DAYS Performed at [facility], [place] [address], [place], [place] [address]     Report Status 01/26/2019 FINAL  Final  Culture, blood (routine x 2)  Status: Abnormal   Collection Time: 01/21/19  1:33 PM   Specimen: BLOOD  Result Value Ref Range Status   Specimen Description   Final    BLOOD RIGHT HAND Performed at [place] [address], [place], [place] [address]    Special Requests   Final    BOTTLES DRAWN AEROBIC AND ANAEROBIC Blood Culture adequate volume Performed at [place] [address] [place], [place] [address]    Culture  Setup Time   Final    ANAEROBIC BOTTLE ONLY GRAM NEGATIVE RODS CRITICAL RESULT CALLED TO, READ BACK BY AND VERIFIED WITH: Irwin Brakeman [id number] 01/24/2019 T. TYSOR    Culture (A)  Final    BACTEROIDES VULGATUS BETA LACTAMASE POSITIVE Performed at [facility], [place] [address] [place], [place] [address]    Report Status 01/26/2019 FINAL  Final  Blood Culture ID Panel (Reflexed)     Status: None   Collection Time: 01/21/19  1:33 PM  Result Value Ref Range Status   Enterococcus species NOT DETECTED NOT DETECTED Final   Listeria monocytogenes NOT DETECTED NOT DETECTED Final   Staphylococcus species NOT DETECTED NOT DETECTED Final   Staphylococcus aureus (BCID) NOT DETECTED NOT DETECTED Final   Streptococcus species NOT DETECTED NOT DETECTED Final   Streptococcus agalactiae NOT DETECTED NOT DETECTED Final   Streptococcus pneumoniae NOT DETECTED NOT DETECTED Final   Streptococcus pyogenes NOT DETECTED NOT DETECTED Final   Acinetobacter baumannii NOT DETECTED NOT DETECTED Final   Enterobacteriaceae species NOT DETECTED NOT DETECTED Final   Enterobacter cloacae complex NOT DETECTED NOT DETECTED Final   Escherichia coli NOT DETECTED NOT DETECTED Final   Klebsiella oxytoca NOT DETECTED NOT DETECTED Final   Klebsiella pneumoniae NOT DETECTED NOT DETECTED Final   Proteus species NOT DETECTED NOT DETECTED Final   Serratia marcescens NOT DETECTED NOT DETECTED Final    Haemophilus influenzae NOT DETECTED NOT DETECTED Final   Neisseria meningitidis NOT DETECTED NOT DETECTED Final   Pseudomonas aeruginosa NOT DETECTED NOT DETECTED Final   Candida albicans NOT DETECTED NOT DETECTED Final   Candida glabrata NOT DETECTED NOT DETECTED Final   Candida krusei NOT DETECTED NOT DETECTED Final   Candida parapsilosis NOT DETECTED NOT DETECTED Final   Candida tropicalis NOT DETECTED NOT DETECTED Final    Comment: Performed at [facility], [address], [place], [place] [address]  MRSA PCR Screening     Status: None   Collection Time: 01/21/19  7:28 PM   Specimen: Nasal Mucosa; Nasopharyngeal  Result Value Ref Range Status   MRSA by PCR NEGATIVE NEGATIVE Final    Comment:        The GeneXpert MRSA Assay (FDA approved for NASAL specimens only), is one component of a comprehensive MRSA colonization surveillance program. It is not intended to diagnose MRSA infection nor to guide or monitor treatment for MRSA infections. Performed at [facility], [place] [address], [place], [place] [address]   Culture, Urine     Status: None   Collection Time: 01/23/19  2:02 PM   Specimen: Urine, Random  Result Value Ref Range Status   Specimen Description   Final    URINE, RANDOM Performed at [place] [address] [place], [place] [address]    Special Requests   Final    NONE Performed at [facility], [place] [address] [place], [place] [address]    Culture   Final  NO GROWTH Performed at [facility], [place] [address] [place], [place] [address]    Report Status 01/24/2019 FINAL  Final  Body fluid culture     Status: None   Collection Time: 01/26/19 12:43 PM   Specimen: Body Fluid  Result Value Ref Range Status   Specimen Description FLUID  Final   Special Requests SYNOVIAL KNEE  Final   Gram Stain   Final    WBC PRESENT,BOTH PMN AND MONONUCLEAR NO ORGANISMS SEEN CYTOSPIN SMEAR     Culture   Final    NO GROWTH 3 DAYS Performed at [facility], [address] [place],  [address]    Report Status 01/29/2019 FINAL  Final      Radiology Studies: Vas [place] Upper Extremity Venous Duplex  Result Date: 01/29/2019 UPPER VENOUS STUDY  Indications: Edema Limitations: Line in right neck. Comparison Study: No prior study on file Performing Technologist: Sharion Dove RVS  Examination Guidelines: A complete evaluation includes B-mode imaging, spectral Doppler, color Doppler, and power Doppler as needed of all accessible portions of each vessel. Bilateral testing is considered an integral part of a complete examination. Limited examinations for reoccurring indications may be performed as noted.  Right Findings: +----------+------------+---------+-----------+----------+-------+ RIGHT     CompressiblePhasicitySpontaneousPropertiesSummary +----------+------------+---------+-----------+----------+-------+ IJV           Full       Yes       Yes                      +----------+------------+---------+-----------+----------+-------+ Subclavian    Full       Yes       Yes                      +----------+------------+---------+-----------+----------+-------+ Axillary      Full       Yes       Yes                      +----------+------------+---------+-----------+----------+-------+ Brachial      Full       Yes       Yes                      +----------+------------+---------+-----------+----------+-------+ Radial        Full                                          +----------+------------+---------+-----------+----------+-------+ Ulnar         Full                                          +----------+------------+---------+-----------+----------+-------+ Cephalic      Full                                          +----------+------------+---------+-----------+----------+-------+ Basilic       Full                                           +----------+------------+---------+-----------+----------+-------+  Left Findings: +----------+------------+---------+-----------+----------+-------+ LEFT      CompressiblePhasicitySpontaneousPropertiesSummary +----------+------------+---------+-----------+----------+-------+ Subclavian    Full       Yes       Yes                      +----------+------------+---------+-----------+----------+-------+  Summary:  Right: No evidence of deep vein thrombosis in the upper extremity. No evidence of superficial vein thrombosis in the upper extremity.  Left: No evidence of thrombosis in the subclavian.  *See table(s) above for measurements and observations.  Diagnosing physician: Monica Martinez MD Electronically signed by Monica Martinez MD on 01/29/2019 at 2:36:24 PM.    Final      Scheduled Meds: . amLODipine  5 mg Oral Daily  . Chlorhexidine Gluconate Cloth  6 each Topical Daily  . DULoxetine  60 mg Oral Daily  . furosemide  80 mg Intravenous Once  . heparin injection (subcutaneous)  5,000 Units Subcutaneous Q8H  . insulin aspart  0-5 Units Subcutaneous QHS  . insulin aspart  0-9 Units Subcutaneous TID WC  . insulin glargine  20 Units Subcutaneous Q2200  . mouth rinse  15 mL Mouth Rinse BID  . metoprolol succinate  100 mg Oral Daily  . pantoprazole  40 mg Oral Daily  . predniSONE  40 mg Oral Q breakfast  . simethicone  80 mg Oral QID  . sodium chloride flush  10-40 mL Intracatheter Q12H  . thiamine  100 mg Oral Daily  . traZODone  300 mg Oral QHS   Continuous Infusions: . cefTRIAXone (ROCEPHIN)  IV 2 g (01/29/19 1228)  . metronidazole 500 mg (01/30/19 0738)     LOS: 10 days   Time Spent in minutes   45 minutes  Giavonna Pflum D.O. on 01/30/2019 at 11:25 AM  Between 7am to 7pm - Please see pager noted on [email or web address]  After 7pm go to [email or web address]  And look for the night coverage person covering for me after hours  [facility] Office   [phone]

## 2019-01-30 NOTE — Progress Notes (Signed)
[facility] Progress Note     Subjective: CC: pancreatitis Patient reports some mild abdominal pain but better than before. Reports he ate some spaghetti over the weekend which irritated him slightly. Had some nausea after this but no emesis. Bowels are working. Held off on dialysis over the weekend. Making good urine.   Objective: Vital signs in last 24 hours: Temp:  [97.4 F (36.3 C)-98.7 F (37.1 C)] 98.7 F (37.1 C) (11/16 0537) Pulse Rate:  [61-64] 61 (11/16 0537) Resp:  [16-20] 16 (11/16 0537) BP: (145-172)/(75-89) 149/75 (11/16 0537) SpO2:  [92 %-95 %] 93 % (11/16 0537) Weight:  [163.3 kg] 163.3 kg (11/16 0537) Last BM Date: 01/29/19  Intake/Output from previous day: 11/15 0701 - 11/16 0700 In: 800 [P.O.:600; IV Piggyback:200] Out: 1750 [Urine:1750] Intake/Output this shift: Total I/O In: 240 [P.O.:240] Out: 300 [Urine:300]  PE: Gen:  Alert, NAD, pleasant Card:  Regular rate and rhythm Pulm:  Normal effort, clear to auscultation bilaterally Abd: Soft, non-tender, diastasis, +BS, supraumbilical midline surgical scar  Skin: warm and dry, no rashes  Psych: A&Ox3   Lab Results:  Recent Labs    01/30/19 0306  HGB 12.5*  HCT 39.8   BMET Recent Labs    01/29/19 0329 01/30/19 0306  NA 136 136  K 4.5 4.1  CL 100 99  CO2 21* 19*  GLUCOSE 226* 211*  BUN 76* 93*  CREATININE 7.33* 7.57*  CALCIUM 8.5* 8.5*   PT/INR No results for input(s): LABPROT, INR in the last 72 hours. CMP     Component Value Date/Time   NA 136 01/30/2019 0306   NA 139 02/12/2015 1424   K 4.1 01/30/2019 0306   CL 99 01/30/2019 0306   CO2 19 (L) 01/30/2019 0306   GLUCOSE 211 (H) 01/30/2019 0306   BUN 93 (H) 01/30/2019 0306   BUN 15 02/12/2015 1424   CREATININE 7.57 (H) 01/30/2019 0306   CREATININE 1.14 10/01/2016 1504   CALCIUM 8.5 (L) 01/30/2019 0306   PROT 6.8 01/27/2019 0429   PROT 6.7 10/11/2014 0833   ALBUMIN 2.0 (L) 01/30/2019 0306   ALBUMIN 3.8 10/11/2014  0833   AST 20 01/27/2019 0429   ALT 32 01/27/2019 0429   ALKPHOS 115 01/27/2019 0429   BILITOT 1.2 01/27/2019 0429   BILITOT 0.4 10/11/2014 0833   GFRNONAA 7 (L) 01/30/2019 0306   GFRNONAA 67 10/01/2016 1504   GFRAA 8 (L) 01/30/2019 0306   GFRAA 78 10/01/2016 1504   Lipase     Component Value Date/Time   LIPASE 38 01/24/2019 0150       Studies/Results: Vas [place] Upper Extremity Venous Duplex  Result Date: 01/29/2019 UPPER VENOUS STUDY  Indications: Edema Limitations: Line in right neck. Comparison Study: No prior study on file Performing Technologist: Sharion Dove RVS  Examination Guidelines: A complete evaluation includes B-mode imaging, spectral Doppler, color Doppler, and power Doppler as needed of all accessible portions of each vessel. Bilateral testing is considered an integral part of a complete examination. Limited examinations for reoccurring indications may be performed as noted.  Right Findings: +----------+------------+---------+-----------+----------+-------+ RIGHT     CompressiblePhasicitySpontaneousPropertiesSummary +----------+------------+---------+-----------+----------+-------+ IJV           Full       Yes       Yes                      +----------+------------+---------+-----------+----------+-------+ Subclavian    Full       Yes  Yes                      +----------+------------+---------+-----------+----------+-------+ Axillary      Full       Yes       Yes                      +----------+------------+---------+-----------+----------+-------+ Brachial      Full       Yes       Yes                      +----------+------------+---------+-----------+----------+-------+ Radial        Full                                          +----------+------------+---------+-----------+----------+-------+ Ulnar         Full                                          +----------+------------+---------+-----------+----------+-------+  Cephalic      Full                                          +----------+------------+---------+-----------+----------+-------+ Basilic       Full                                          +----------+------------+---------+-----------+----------+-------+  Left Findings: +----------+------------+---------+-----------+----------+-------+ LEFT      CompressiblePhasicitySpontaneousPropertiesSummary +----------+------------+---------+-----------+----------+-------+ Subclavian    Full       Yes       Yes                      +----------+------------+---------+-----------+----------+-------+  Summary:  Right: No evidence of deep vein thrombosis in the upper extremity. No evidence of superficial vein thrombosis in the upper extremity.  Left: No evidence of thrombosis in the subclavian.  *See table(s) above for measurements and observations.  Diagnosing physician: Monica Martinez MD Electronically signed by Monica Martinez MD on 01/29/2019 at 2:36:24 PM.    Final     Anti-infectives: Anti-infectives (From admission, onward)   Start     Dose/Rate Route Frequency Ordered Stop   01/23/19 1400  piperacillin-tazobactam (ZOSYN) IVPB 2.25 g  Status:  Discontinued     2.25 g 100 mL/hr over 30 Minutes Intravenous Every 8 hours 01/23/19 0717 01/23/19 0947   01/23/19 1400  cefTRIAXone (ROCEPHIN) 2 g in sodium chloride 0.9 % 100 mL IVPB     2 g 200 mL/hr over 30 Minutes Intravenous Every 24 hours 01/23/19 0947     01/23/19 1400  metroNIDAZOLE (FLAGYL) IVPB 500 mg     500 mg 100 mL/hr over 60 Minutes Intravenous Every 8 hours 01/23/19 0947     01/21/19 1400  piperacillin-tazobactam (ZOSYN) IVPB 3.375 g  Status:  Discontinued     3.375 g 12.5 mL/hr over 240 Minutes Intravenous Every 8 hours 01/21/19 1325 01/23/19 0717       Assessment/Plan Sepsis/shock Respiratory failure with hypoxia Elevated LFTs - LFTs and Tbili all normalized on  11/13 Uncontrolled type 2 diabetes with diabetic  neuropathy Hypertension GERD/Hx GI bleed AKI: creatinine  7.57, making urine, intermittent HD per renal  Hx AF Hx alcoholism/Gastric bypass Morbid obesity BMI 53.8  Pancreatitis, abdominal pain, cholelithiasis and jaundice - MRI 11/8 shows marked peripancreatic fluid along with pain head related pancreatitis, cholelithiasis with mild gallbladder wall thickening intrahepatic or extrahepatic dilatation 4 mm - patient pain improving and lipase normalized - no jaundice  - I think may be able to consider lap chole this week - will discuss with MD today    FEN: Clear liquid ID: Zosyn 11/7-11/9;  Rocephin/Flagyl 11/9  >>  DVT: SQ heparin Follow up:  TBD   LOS: 10 days    Brigid Re , [facility] Surgery 01/30/2019, 9:53 AM Please see Amion for pager number during day hours 7:00am-4:30pm

## 2019-01-30 NOTE — Progress Notes (Signed)
[facility] Progress Note  Subjective: Rate of rise slowing, up to 7.5.  Still with a good bit of volume on.    Vitals:   01/29/19 0622 01/29/19 1710 01/29/19 2108 01/30/19 0537  BP: (!) 145/80 (!) 172/89 (!) 145/83 (!) 149/75  Pulse: 65 64 62 61  Resp: 17 20 16 16   Temp: (!) 97.5 F (36.4 C) (!) 97.4 F (36.3 C) (!) 97.5 F (36.4 C) 98.7 F (37.1 C)  TempSrc: Oral Oral Oral   SpO2: 93% 92% 95% 93%  Weight:    (!) 163.3 kg  Height:        Inpatient medications: . amLODipine  5 mg Oral Daily  . Chlorhexidine Gluconate Cloth  6 each Topical Daily  . DULoxetine  60 mg Oral Daily  . furosemide  80 mg Intravenous Once  . heparin injection (subcutaneous)  5,000 Units Subcutaneous Q8H  . insulin aspart  0-5 Units Subcutaneous QHS  . insulin aspart  0-9 Units Subcutaneous TID WC  . insulin glargine  20 Units Subcutaneous Q2200  . mouth rinse  15 mL Mouth Rinse BID  . metoprolol succinate  100 mg Oral Daily  . pantoprazole  40 mg Oral Daily  . [START ON 01/31/2019] predniSONE  20 mg Oral Q breakfast  . simethicone  80 mg Oral QID  . sodium chloride flush  10-40 mL Intracatheter Q12H  . thiamine  100 mg Oral Daily  . traZODone  300 mg Oral QHS   . cefTRIAXone (ROCEPHIN)  IV 2 g (01/29/19 1228)  . metronidazole 500 mg (01/30/19 0738)   bisacodyl, heparin, HYDROmorphone (DILAUDID) injection, hydrOXYzine, levalbuterol, ondansetron **OR** ondansetron (ZOFRAN) IV, oxyCODONE-acetaminophen, polyethylene glycol, sodium chloride flush    Exam: GEN: NAD, sitting in bed HEENT: EOMI PERRL MMM NECK: R IJ nontunneled HD cath in place CV: RRR no m/r/g PULM: clear bilaterally no c/w/r ABD: obese EXT: 2+ anasarca     Home meds:   - metoprolol 50 bid/ metolazone 2.5 qd/ lisinopril 40 qd/ torsemide 100 bid/ amlodipine 10/ losartan 100/ amlodipine  - methadone 10 tid prn/ naloxegol oxalate 25 qd  - insulin glargine 20 hs/ ? Metformin   - gabapentin 300 tid prn/ duloxetine  60 bid/ trazodone 300 hs/ ? lyrica  - pantoprazole 40 qd/ atorvastatin 40   UA turbid, red, >50 rbc and 11-20 wbc   UN 65  UCr 114  Renal US > IMPRESSION:    Limited visualization of the kidneys due to body habitus. No obstruction. Normal renal size. Urinary bladder empty with Foley catheter.  Assessment/ Plan: 1. Acute renal failure - combination of IV contrast/ hypotension-shock w/  ARB/ACEi on board. Creat worsened and is now sp HD x 2. Rate of rise slowing- will give Lasix challenge today and see what kind of UOP he has- if improved then can hold off, if not then will do HD tomorrow 11/17. 2. Septic shock/ abd pain - resolved, suspected gallstone pancreatitis, taking solid food now. Any procedures on hold for #1.  3. HTN -long hx 4. Chronic pain 5. Depression 6. Chronic CHF - lungs clear by latest CXR 7. Vol excess - peripheral edema 2+, as above, tolerating well      Madelon Lips MD 01/30/2019, 11:39 AM  Iron/TIBC/Ferritin/ %Sat    Component Value Date/Time   IRON 35 (L) 09/16/2013 0243   TIBC 397 09/16/2013 0243   FERRITIN 11 (L) 09/16/2013 0243   IRONPCTSAT 9 (L) 09/16/2013 0243   Recent Labs  Lab 01/30/19 0306  NA 136  K 4.1  CL 99  CO2 19*  GLUCOSE 211*  BUN 93*  CREATININE 7.57*  CALCIUM 8.5*  PHOS 8.8*  ALBUMIN 2.0*   Recent Labs  Lab 01/27/19 0429  AST 20  ALT 32  ALKPHOS 115  BILITOT 1.2  PROT 6.8   Recent Labs  Lab 01/27/19 0429 01/30/19 0306  WBC 11.2*  --   HGB 11.2* 12.5*  HCT 35.0* 39.8  PLT 271  --

## 2019-01-30 NOTE — Progress Notes (Signed)
Inpatient Diabetes Program Recommendations  AACE/ADA: New Consensus Statement on Inpatient Glycemic Control (2015)  Target Ranges:  Prepandial:   less than 140 mg/dL      Peak postprandial:   less than 180 mg/dL (1-2 hours)      Critically ill patients:  140 - 180 mg/dL   Lab Results  Component Value Date   GLUCAP 195 (H) 01/29/2019   HGBA1C 8.6 (H) 01/21/2019    Review of Glycemic Control Results for Phillip Maldonado, Phillip "BRAD" (MRN [id number]) as of 01/30/2019 09:50  Ref. Range 01/29/2019 08:32 01/29/2019 11:52 01/29/2019 17:12 01/29/2019 21:09  Glucose-Capillary Latest Ref Range: 70 - 99 mg/dL 189 (H) 206 (H) 223 (H) 195 (H)   Diabetes history: DM 2 Outpatient Diabetes medications: Lantus 20 units, Metformin 1000 mg bid Current orders for Inpatient glycemic control:  Lantus 20 units Novolog 0-9 units tid + hs  PO prednisone 40 mg Daily  Inpatient Diabetes Program Recommendations:    Glucose trends still elevated. If steroids remain at current dose, pt may benefit from increasing Lantus to 22 units.  Thanks, Phillip Headings RN, MSN, BC-ADM Inpatient Diabetes Coordinator Team Pager [phone] (8a-5p)

## 2019-01-31 LAB — RENAL FUNCTION PANEL
Albumin: 2.1 g/dL — ABNORMAL LOW (ref 3.5–5.0)
Anion gap: 17 — ABNORMAL HIGH (ref 5–15)
BUN: 104 mg/dL — ABNORMAL HIGH (ref 8–23)
CO2: 19 mmol/L — ABNORMAL LOW (ref 22–32)
Calcium: 8.5 mg/dL — ABNORMAL LOW (ref 8.9–10.3)
Chloride: 101 mmol/L (ref 98–111)
Creatinine, Ser: 7.3 mg/dL — ABNORMAL HIGH (ref 0.61–1.24)
GFR calc Af Amer: 8 mL/min — ABNORMAL LOW (ref >60)
GFR calc non Af Amer: 7 mL/min — ABNORMAL LOW (ref >60)
Glucose, Bld: 215 mg/dL — ABNORMAL HIGH (ref 70–99)
Phosphorus: 9.5 mg/dL — ABNORMAL HIGH (ref 2.5–4.6)
Potassium: 4 mmol/L (ref 3.5–5.1)
Sodium: 137 mmol/L (ref 135–145)

## 2019-01-31 LAB — GLUCOSE, CAPILLARY
Glucose-Capillary: 159 mg/dL — ABNORMAL HIGH (ref 70–99)
Glucose-Capillary: 180 mg/dL — ABNORMAL HIGH (ref 70–99)
Glucose-Capillary: 317 mg/dL — ABNORMAL HIGH (ref 70–99)
Glucose-Capillary: 270 mg/dL — ABNORMAL HIGH (ref 70–99)

## 2019-01-31 MED ORDER — SODIUM CHLORIDE 0.9 % IV SOLN
3.0000 g | INTRAVENOUS | Status: DC
  Administered 2019-01-31: 3 g via INTRAVENOUS
  Filled 2019-01-31: qty 8

## 2019-01-31 MED ORDER — SODIUM CHLORIDE 0.9 % IV SOLN
3.0000 g | Freq: Two times a day (BID) | INTRAVENOUS | Status: DC
  Administered 2019-01-31 – 2019-02-07 (×14): 3 g via INTRAVENOUS
  Filled 2019-01-31: qty 3
  Filled 2019-01-31: qty 8
  Filled 2019-01-31 (×3): qty 3
  Filled 2019-01-31 (×3): qty 8
  Filled 2019-01-31 (×2): qty 3
  Filled 2019-01-31: qty 8
  Filled 2019-01-31: qty 3
  Filled 2019-01-31: qty 8
  Filled 2019-01-31: qty 3
  Filled 2019-01-31: qty 8

## 2019-01-31 MED ORDER — FUROSEMIDE 10 MG/ML IJ SOLN
80.0000 mg | Freq: Once | INTRAMUSCULAR | Status: AC
  Administered 2019-01-31: 80 mg via INTRAVENOUS
  Filled 2019-01-31: qty 8

## 2019-01-31 NOTE — Progress Notes (Signed)
[facility] Progress Note    Assessment/ Plan:   1. Acute renal failure - combination of IV contrast/ hypotension-shock w/  ARB/ACEi on board. Creat worsened and is now sp HD x 2, last Friday 11/13. Evidence of incipient recovery- will give another 80 IV Lasix today and assess UOP/ Cr trend  2. Septic shock/ abd pain - resolved, suspected gallstone pancreatitis, surgery consulted, will need cholecystectomy.  On Rocephin/ Flagyl.    3. Chronic pain  4. Depression  5. Chronic CHF - lungs clear by latest CXR   Subjective:    Seen in room.  Robust UOP with 80 IV lasix x 1 yesterday, Cr just a little down.  Still with some nausea/ abd pain.    Objective:   BP (!) 149/76   Pulse (!) 58   Temp 98.8 F (37.1 C)   Resp 14   Ht 5\' 9"  (1.753 m)   Wt (!) 161 kg   SpO2 95%   BMI 52.42 kg/m   Intake/Output Summary (Last 24 hours) at 01/31/2019 1011 Last data filed at 01/31/2019 0557 Gross per 24 hour  Intake 780 ml  Output 2625 ml  Net -1845 ml   Weight change: -2.3 kg  Physical Exam: Gen: NAD, lying in bed CVS: RRR no m/r/g Resp: clear bilaterally no c/w/r Abd: soft, RUQ tenderness, hypoactive BS Ext: 2+ anasarca, improved Dialysis Access: R IJ nontunneled HD cath  Imaging: Vas [place] Upper Extremity Venous Duplex  Result Date: 01/29/2019 UPPER VENOUS STUDY  Indications: Edema Limitations: Line in right neck. Comparison Study: No prior study on file Performing Technologist: Sharion Dove RVS  Examination Guidelines: A complete evaluation includes B-mode imaging, spectral Doppler, color Doppler, and power Doppler as needed of all accessible portions of each vessel. Bilateral testing is considered an integral part of a complete examination. Limited examinations for reoccurring indications may be performed as noted.  Right Findings: +----------+------------+---------+-----------+----------+-------+ RIGHT     CompressiblePhasicitySpontaneousPropertiesSummary  +----------+------------+---------+-----------+----------+-------+ IJV           Full       Yes       Yes                      +----------+------------+---------+-----------+----------+-------+ Subclavian    Full       Yes       Yes                      +----------+------------+---------+-----------+----------+-------+ Axillary      Full       Yes       Yes                      +----------+------------+---------+-----------+----------+-------+ Brachial      Full       Yes       Yes                      +----------+------------+---------+-----------+----------+-------+ Radial        Full                                          +----------+------------+---------+-----------+----------+-------+ Ulnar         Full                                          +----------+------------+---------+-----------+----------+-------+  Cephalic      Full                                          +----------+------------+---------+-----------+----------+-------+ Basilic       Full                                          +----------+------------+---------+-----------+----------+-------+  Left Findings: +----------+------------+---------+-----------+----------+-------+ LEFT      CompressiblePhasicitySpontaneousPropertiesSummary +----------+------------+---------+-----------+----------+-------+ Subclavian    Full       Yes       Yes                      +----------+------------+---------+-----------+----------+-------+  Summary:  Right: No evidence of deep vein thrombosis in the upper extremity. No evidence of superficial vein thrombosis in the upper extremity.  Left: No evidence of thrombosis in the subclavian.  *See table(s) above for measurements and observations.  Diagnosing physician: Monica Martinez MD Electronically signed by Monica Martinez MD on 01/29/2019 at 2:36:24 PM.    Final     Labs: BMET Recent Labs  Lab 01/25/19 0143 01/26/19 [id number]  01/27/19 0429 01/28/19 0428 01/29/19 0329 01/30/19 0306 01/31/19 0350  NA 134* 136 133* 135 136 136 137  K 4.3 4.3 4.6 4.3 4.5 4.1 4.0  CL 98 99 98 98 100 99 101  CO2 21* 20* 19* 21* 21* 19* 19*  GLUCOSE 175* 187* 194* 188* 226* 211* 215*  BUN 75* 54* 70* 57* 76* 93* 104*  CREATININE 8.26* 7.31* 8.40* 6.74* 7.33* 7.57* 7.30*  CALCIUM 8.3* 8.3* 8.3* 8.4* 8.5* 8.5* 8.5*  PHOS 4.8*  --   --  6.1* 7.9* 8.8* 9.5*   CBC Recent Labs  Lab 01/25/19 0143 01/27/19 0429 01/30/19 0306  WBC 11.2* 11.2*  --   NEUTROABS 8.7*  --   --   HGB 11.5* 11.2* 12.5*  HCT 36.7* 35.0* 39.8  MCV 91.3 88.8  --   PLT 214 271  --     Medications:    . amLODipine  5 mg Oral Daily  . Chlorhexidine Gluconate Cloth  6 each Topical Daily  . DULoxetine  60 mg Oral Daily  . furosemide  80 mg Intravenous Once  . heparin injection (subcutaneous)  5,000 Units Subcutaneous Q8H  . insulin aspart  0-5 Units Subcutaneous QHS  . insulin aspart  0-9 Units Subcutaneous TID WC  . insulin glargine  20 Units Subcutaneous Q2200  . mouth rinse  15 mL Mouth Rinse BID  . metoprolol succinate  100 mg Oral Daily  . pantoprazole  40 mg Oral Daily  . predniSONE  20 mg Oral Q breakfast  . simethicone  80 mg Oral QID  . sodium chloride flush  10-40 mL Intracatheter Q12H  . thiamine  100 mg Oral Daily  . traZODone  300 mg Oral QHS      Madelon Lips MD 01/31/2019, 10:11 AM

## 2019-01-31 NOTE — Progress Notes (Addendum)
PROGRESS NOTE    CARSEN SHODA  [name] DOB: [birth year] DOA: 01/20/2019 PCP: Patient, No Pcp Per   Brief Narrative:  HPI on 01/20/2019 by Dr. Merton Border Esdras Marcoe  is a 68 y.o. 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 fib, chronic abdominal pain presenting with acute onset of epigastric pain radiating to the right upper chest associated with nausea vomiting and no diarrhea.  The pain was radiating to the right chest and the patient had full work-up in the emergency room including an EKG, troponins CT of abdomen and pelvis with no significant abnormalities noted.  Patient denies any history of blood in his urine or stools.  Patient had subjective fevers early in the morning at 430 when he wake up with the abdominal pain.  Patient is on chronic pain medications but taking MiraLAX as well. Patient denies any cough or shortness of breath. Patient was recently treated for upper GI bleed and case discussed with Dr. Michail Sermon from GI. Work-up in the emergency room showed hyperglycemia otherwise most of his blood work is full and within the normal range when showed colonic diverticulosis  Interim History Patient mated for gallstone pancreatitis.  However he was noted to have septic shock on 01/21/2019 and was transferred to the ICU.  Patient was eventually weaned off of pressors and transferred back to the floor.  He did have MRCP which showed acute pancreatitis, cholelithiasis, no CBD obstruction.  Patient suspected to have gallstone pancreatitis.  General surgery consulted and appreciated, however patient not medically stable for surgical intervention at this time.  Patient was also noted to have worsening creatinine, and nephrology has been seeing him.  Patient needing temporary hemodialysis.  He was then transferred to [facility]. Pending possible lab chole later this week.  Assessment & Plan   Septic shock  likely secondary to acute cholecystitis/ Acute gallstone pancreatitis -Was noted to be mildly tachycardic and tachypneic on admission, however not febrile, without leukocytosis, and blood pressure was normal at that time -However over the next 24 hours, patient did develop leukocytosis of 22, became hypotensive and required ICU admission for pressors -Patient was also noted to have an elevated lipase as well as LFTs, however these did improve as hospitalization continued. -MRCP showed mild peripancreatic fluid along with pancreatic head/uncinate process likely related to acute pancreatitis.  No fluid collection or pseudocyst.  Cholelithiasis. -HIDA scan was ordered, however refused by patient -Gastroenterology consulted and appreciated, recommended follow-up with Dr. Arelia Longest in 2 to 4 weeks  -was on ceftriaxone and Flagyl- transitioned to unasyn -Blood cultures 01/21/2019: anaerobic bottle only with gram-negative rods- bacteroides vulgatus -given abdominal pain, transitioned back to liquid diet -General surgery still following, and feels the patient may be a candidate for surgery later this week  Acute kidney injury -Upon presentation, patient's creatinine was normal -However he developed AKI in the setting of septic shock.  Despite IV hydration, creatinine peaked to 8.26.  -Renal ultrasound showed no obstruction -Urine culture showed no growth -Nephrology consulted and appreciated -PCCM placed central venous catheter -Creatinine currently 7.30 -Discussed with nephrology, holding off on hemodialysis today however patient does appear to be volume overloaded -Given IV lasix on 11/16, and another dose   Acute respiratory failure with hypoxia -History of COPD, heart failure, OSA, however not on oxygen supplementation at home -Patient was requiring supplemental oxygen however has now been weaned off, and currently on room air maintaining oxygen saturations in the  90s  Essential hypertension -BP  improved -continue amlodipine   Hyperlipidemia -Statin on hold given elevated LFTs  Acute diastolic CHF -Patient does appear to be mildly overloaded, suspect secondary to IV fluids given for acute kidney injury and sepsis -Echocardiogram on 01/22/2019 showed an EF of [id number], grade 1 diastolic dysfunction -Suspect fluid status will improve with temporary hemodialysis  Diabetes mellitus, type II -Metformin held -Continue ISS and lantus with CBG monitoring  -Hemoglobin A1c 8.6  GERD/history of GI bleed -Continue PPI- switch to oral  Morbid obesity/sleep apnea -BMI 53.95 -Patient to follow-up with his PCP to discuss lifestyle modifications -patient does not use CPAP at home  Left knee pain -Patient was supposed to have some type of procedure few months ago however this was delayed as his wife had Covid. -Orthopedics consulted and appreciated, status post left knee aspiration and injection (under 90 mL of clear fluid was obtained, 6 mL of 0.5% Marcaine and 80 mg of Depo-Medrol injected)  Right UE edema -possibly due to overload from IVF -Upper extremity Doppler was negative for right DVT -have asked for patient to elevate arm  DVT Prophylaxis Heparin  Code Status: Full  Family Communication: None at bedside  Disposition Plan: Admitted.  Currently pending improvement in renal function as well as possible surgical intervention this admission.  Disposition to be determined.  Consultants PCCM General surgery Nephrology Gastroenterology Orthopedic surgery  Procedures  Echocardiogram Insertion of central venous catheter Renal ultrasound MRCP Left knee aspiration and injection Right upper extremity Doppler  Antibiotics   Anti-infectives (From admission, onward)   Start     Dose/Rate Route Frequency Ordered Stop   01/31/19 2200  Ampicillin-Sulbactam (UNASYN) 3 g in sodium chloride 0.9 % 100 mL IVPB     3 g 200 mL/hr over 30 Minutes Intravenous Every 12 hours 01/31/19  1051     01/31/19 1000  Ampicillin-Sulbactam (UNASYN) 3 g in sodium chloride 0.9 % 100 mL IVPB  Status:  Discontinued     3 g 200 mL/hr over 30 Minutes Intravenous Every 24 hours 01/31/19 0859 01/31/19 1051   01/23/19 1400  piperacillin-tazobactam (ZOSYN) IVPB 2.25 g  Status:  Discontinued     2.25 g 100 mL/hr over 30 Minutes Intravenous Every 8 hours 01/23/19 0717 01/23/19 0947   01/23/19 1400  cefTRIAXone (ROCEPHIN) 2 g in sodium chloride 0.9 % 100 mL IVPB  Status:  Discontinued     2 g 200 mL/hr over 30 Minutes Intravenous Every 24 hours 01/23/19 0947 01/31/19 0857   01/23/19 1400  metroNIDAZOLE (FLAGYL) IVPB 500 mg  Status:  Discontinued     500 mg 100 mL/hr over 60 Minutes Intravenous Every 8 hours 01/23/19 0947 01/31/19 0857   01/21/19 1400  piperacillin-tazobactam (ZOSYN) IVPB 3.375 g  Status:  Discontinued     3.375 g 12.5 mL/hr over 240 Minutes Intravenous Every 8 hours 01/21/19 1325 01/23/19 [id number]      Subjective:   [name] seen and examined today.  Patient currently having mild abdominal pain.  Able to have bowel movement today.  Denies current chest pain or shortness of breath, nausea or vomiting, dizziness or headache.  He is to complain of right hand swelling.  Objective:   Vitals:   01/30/19 1418 01/30/19 2114 01/31/19 0512 01/31/19 0901  BP: (!) 146/70 129/80 (!) 160/90 (!) 149/76  Pulse: 62 62 (!) 58   Resp: 18 16 14    Temp: 98.5 F (36.9 C) (!) 97.4 F (36.3 C) 98.8 F (37.1 C)  TempSrc: Oral Oral    SpO2: 93% 98% 95%   Weight:   (!) 161 kg   Height:        Intake/Output Summary (Last 24 hours) at 01/31/2019 1257 Last data filed at 01/31/2019 1153 Gross per 24 hour  Intake 1160 ml  Output 4225 ml  Net -3065 ml   Filed Weights   01/29/19 0518 01/30/19 0537 01/31/19 0512  Weight: (!) 163.3 kg (!) 163.3 kg (!) 161 kg   Exam  General: Well developed, acutely ill appearing, NAD  HEENT: NCAT, mucous membranes moist.   Neck: Supple, no JVD,  no masses  Cardiovascular: S1 S2 auscultated, RRR, no murmur  Respiratory: Clear to auscultation bilaterally with equal chest rise  Abdomen: Soft, tender, nondistended, + bowel sounds  Extremities: warm dry without cyanosis clubbing. RUE edema , B/L LE edema  Neuro: AAOx3, nonfocal  Psych: pleasant, appropriate mood and affect   Data Reviewed: I have personally reviewed following labs and imaging studies  CBC: Recent Labs  Lab 01/25/19 0143 01/27/19 0429 01/30/19 0306  WBC 11.2* 11.2*  --   NEUTROABS 8.7*  --   --   HGB 11.5* 11.2* 12.5*  HCT 36.7* 35.0* 39.8  MCV 91.3 88.8  --   PLT 214 271  --    Basic Metabolic Panel: Recent Labs  Lab 01/25/19 0143  01/27/19 0429 01/28/19 0428 01/29/19 0329 01/30/19 0306 01/31/19 0350  NA 134*   < > 133* 135 136 136 137  K 4.3   < > 4.6 4.3 4.5 4.1 4.0  CL 98   < > 98 98 100 99 101  CO2 21*   < > 19* 21* 21* 19* 19*  GLUCOSE 175*   < > 194* 188* 226* 211* 215*  BUN 75*   < > 70* 57* 76* 93* 104*  CREATININE 8.26*   < > 8.40* 6.74* 7.33* 7.57* 7.30*  CALCIUM 8.3*   < > 8.3* 8.4* 8.5* 8.5* 8.5*  MG 2.0  --   --   --   --   --   --   PHOS 4.8*  --   --  6.1* 7.9* 8.8* 9.5*   < > = values in this interval not displayed.   GFR: Estimated Creatinine Clearance: 14.8 mL/min (A) (by C-G formula based on SCr of 7.3 mg/dL (H)). Liver Function Tests: Recent Labs  Lab 01/25/19 0143 01/27/19 0429 01/28/19 0428 01/29/19 0329 01/30/19 0306 01/31/19 0350  AST 37 20  --   --   --   --   ALT 65* 32  --   --   --   --   ALKPHOS 122 115  --   --   --   --   BILITOT 1.2 1.2  --   --   --   --   PROT 6.8 6.8  --   --   --   --   ALBUMIN 2.6* 2.0* 2.1* 2.0* 2.0* 2.1*   No results for input(s): LIPASE, AMYLASE in the last 168 hours. No results for input(s): AMMONIA in the last 168 hours. Coagulation Profile: No results for input(s): INR, PROTIME in the last 168 hours. Cardiac Enzymes: No results for input(s): CKTOTAL, CKMB,  CKMBINDEX, TROPONINI in the last 168 hours. BNP (last 3 results) No results for input(s): PROBNP in the last 8760 hours. HbA1C: No results for input(s): HGBA1C in the last 72 hours. CBG: Recent Labs  Lab 01/30/19 1227 01/30/19 1631 01/30/19 2116 01/31/19  [id number] 01/31/19 1234  GLUCAP 255* 313* 311* 159* 180*   Lipid Profile: No results for input(s): CHOL, HDL, LDLCALC, TRIG, CHOLHDL, LDLDIRECT in the last 72 hours. Thyroid Function Tests: No results for input(s): TSH, T4TOTAL, FREET4, T3FREE, THYROIDAB in the last 72 hours. Anemia Panel: No results for input(s): VITAMINB12, FOLATE, FERRITIN, TIBC, IRON, RETICCTPCT in the last 72 hours. Urine analysis:    Component Value Date/Time   COLORURINE RED (A) 01/23/2019 1402   APPEARANCEUR TURBID (A) 01/23/2019 1402   LABSPEC  01/23/2019 1402    TEST NOT REPORTED DUE TO COLOR INTERFERENCE OF URINE PIGMENT   PHURINE  01/23/2019 1402    TEST NOT REPORTED DUE TO COLOR INTERFERENCE OF URINE PIGMENT   GLUCOSEU (A) 01/23/2019 1402    TEST NOT REPORTED DUE TO COLOR INTERFERENCE OF URINE PIGMENT   HGBUR (A) 01/23/2019 1402    TEST NOT REPORTED DUE TO COLOR INTERFERENCE OF URINE PIGMENT   BILIRUBINUR (A) 01/23/2019 1402    TEST NOT REPORTED DUE TO COLOR INTERFERENCE OF URINE PIGMENT   BILIRUBINUR Large 12/30/2015 1600   KETONESUR (A) 01/23/2019 1402    TEST NOT REPORTED DUE TO COLOR INTERFERENCE OF URINE PIGMENT   PROTEINUR (A) 01/23/2019 1402    TEST NOT REPORTED DUE TO COLOR INTERFERENCE OF URINE PIGMENT   UROBILINOGEN 0.2 12/30/2015 1600   UROBILINOGEN 1.0 09/15/2013 1855   NITRITE (A) 01/23/2019 1402    TEST NOT REPORTED DUE TO COLOR INTERFERENCE OF URINE PIGMENT   LEUKOCYTESUR (A) 01/23/2019 1402    TEST NOT REPORTED DUE TO COLOR INTERFERENCE OF URINE PIGMENT   Sepsis Labs: @LABRCNTIP (procalcitonin:4,lacticidven:4)  ) Recent Results (from the past 240 hour(s))  Culture, Urine     Status: Abnormal   Collection Time: 01/21/19   1:03 PM   Specimen: Urine, Random  Result Value Ref Range Status   Specimen Description   Final    URINE, RANDOM Performed at [facility], [place] [address] [place], [place] [address]    Special Requests   Final    NONE Performed at [facility], [place] [address] [place], [place] [address]    Culture (A)  Final    40,000 COLONIES/mL MULTIPLE SPECIES PRESENT, SUGGEST RECOLLECTION   Report Status 01/23/2019 FINAL  Final  Culture, blood (routine x 2)     Status: None   Collection Time: 01/21/19  1:32 PM   Specimen: BLOOD  Result Value Ref Range Status   Specimen Description   Final    BLOOD RIGHT ANTECUBITAL Performed at [facility], [place] [address], [place], [place] [address]    Special Requests   Final    BOTTLES DRAWN AEROBIC AND ANAEROBIC Blood Culture adequate volume Performed at [place] [address] [place], [place] [address]    Culture   Final    NO GROWTH 5 DAYS Performed at [facility], [place] [address] [place], [place] [address]    Report Status 01/26/2019 FINAL  Final  Culture, blood (routine x 2)     Status: Abnormal   Collection Time: 01/21/19  1:33 PM   Specimen: BLOOD  Result Value Ref Range Status   Specimen Description   Final    BLOOD RIGHT HAND Performed at [place] [address], [place], [place] [address]    Special Requests   Final    BOTTLES DRAWN AEROBIC AND ANAEROBIC Blood Culture adequate volume Performed at [facility], [address] [place], [place] [address]    Culture  Setup Time   Final    ANAEROBIC BOTTLE ONLY GRAM NEGATIVE RODS CRITICAL RESULT CALLED TO, READ BACK BY AND VERIFIED WITH: Irwin Brakeman 0520 01/24/2019 T. TYSOR    Culture (A)  Final    BACTEROIDES VULGATUS BETA LACTAMASE POSITIVE Performed at [facility], [place] [address] [place], [place] [address]    Report Status 01/26/2019 FINAL  Final   Blood Culture ID Panel (Reflexed)     Status: None   Collection Time: 01/21/19  1:33 PM  Result Value Ref Range Status   Enterococcus species NOT DETECTED NOT DETECTED Final   Listeria monocytogenes NOT DETECTED NOT DETECTED Final   Staphylococcus species NOT DETECTED NOT DETECTED Final   Staphylococcus aureus (BCID) NOT DETECTED NOT DETECTED Final   Streptococcus species NOT DETECTED NOT DETECTED Final   Streptococcus agalactiae NOT DETECTED NOT DETECTED Final   Streptococcus pneumoniae NOT DETECTED NOT DETECTED Final   Streptococcus pyogenes NOT DETECTED NOT DETECTED Final   Acinetobacter baumannii NOT DETECTED NOT DETECTED Final   Enterobacteriaceae species NOT DETECTED NOT DETECTED Final   Enterobacter cloacae complex NOT DETECTED NOT DETECTED Final   Escherichia coli NOT DETECTED NOT DETECTED Final   Klebsiella oxytoca NOT DETECTED NOT DETECTED Final   Klebsiella pneumoniae NOT DETECTED NOT DETECTED Final   Proteus species NOT DETECTED NOT DETECTED Final   Serratia marcescens NOT DETECTED NOT DETECTED Final   Haemophilus influenzae NOT DETECTED NOT DETECTED Final   Neisseria meningitidis NOT DETECTED NOT DETECTED Final   Pseudomonas aeruginosa NOT DETECTED NOT DETECTED Final   Candida albicans NOT DETECTED NOT DETECTED Final   Candida glabrata NOT DETECTED NOT DETECTED Final   Candida krusei NOT DETECTED NOT DETECTED Final   Candida parapsilosis NOT DETECTED NOT DETECTED Final   Candida tropicalis NOT DETECTED NOT DETECTED Final    Comment: Performed at [facility], [address], [place], [place] [address]  MRSA PCR Screening     Status: None   Collection Time: 01/21/19  7:28 PM   Specimen: Nasal Mucosa; Nasopharyngeal  Result Value Ref Range Status   MRSA by PCR NEGATIVE NEGATIVE Final    Comment:        The GeneXpert MRSA Assay (FDA approved for NASAL specimens only), is one component of a comprehensive MRSA colonization surveillance program. It is not intended  to diagnose MRSA infection nor to guide or monitor treatment for MRSA infections. Performed at [facility], [place] [address] [place], [place] [address]   Culture, Urine     Status: None   Collection Time: 01/23/19  2:02 PM   Specimen: Urine, Random  Result Value Ref Range Status   Specimen Description   Final    URINE, RANDOM Performed at [place] [address] [place], [place] [address]    Special Requests   Final    NONE Performed at [facility], [place] [address] [place], [place] [address]    Culture   Final    NO GROWTH Performed at [facility], [place] [address] [place], [place] [address]    Report Status 01/24/2019 FINAL  Final  Body fluid culture     Status: None   Collection Time: 01/26/19 12:43 PM   Specimen: Body Fluid  Result Value Ref Range Status   Specimen Description FLUID  Final   Special Requests SYNOVIAL KNEE  Final   Gram Stain  Final    WBC PRESENT,BOTH PMN AND MONONUCLEAR NO ORGANISMS SEEN CYTOSPIN SMEAR    Culture   Final    NO GROWTH 3 DAYS Performed at [facility], [place] [address], [place], [place] [address]    Report Status 01/29/2019 FINAL  Final      Radiology Studies: No results found.   Scheduled Meds: . amLODipine  5 mg Oral Daily  . Chlorhexidine Gluconate Cloth  6 each Topical Daily  . DULoxetine  60 mg Oral Daily  . heparin injection (subcutaneous)  5,000 Units Subcutaneous Q8H  . insulin aspart  0-5 Units Subcutaneous QHS  . insulin aspart  0-9 Units Subcutaneous TID WC  . insulin glargine  20 Units Subcutaneous Q2200  . mouth rinse  15 mL Mouth Rinse BID  . metoprolol succinate  100 mg Oral Daily  . pantoprazole  40 mg Oral Daily  . predniSONE  20 mg Oral Q breakfast  . simethicone  80 mg Oral QID  . sodium chloride flush  10-40 mL Intracatheter Q12H  . thiamine  100 mg Oral Daily  . traZODone  300 mg Oral QHS   Continuous Infusions: .  ampicillin-sulbactam (UNASYN) IV       LOS: 11 days   Time Spent in minutes   30 minutes  Sylas Twombly D.O. on 01/31/2019 at 12:57 PM  Between 7am to 7pm - Please see pager noted on [email or web address]  After 7pm go to [email or web address]  And look for the night coverage person covering for me after hours  [facility] Office  [phone]

## 2019-01-31 NOTE — Progress Notes (Signed)
Inpatient Diabetes Program Recommendations  AACE/ADA: New Consensus Statement on Inpatient Glycemic Control (2015)  Target Ranges:  Prepandial:   less than 140 mg/dL      Peak postprandial:   less than 180 mg/dL (1-2 hours)      Critically ill patients:  140 - 180 mg/dL   Lab Results  Component Value Date   GLUCAP 159 (H) 01/31/2019   HGBA1C 8.6 (H) 01/21/2019    Review of Glycemic Control Results for SUMTER, GOZMAN" (MRN [id number]) as of 01/31/2019 10:32  Ref. Range 01/30/2019 08:41 01/30/2019 12:27 01/30/2019 16:31 01/30/2019 21:16 01/31/2019 08:28  Glucose-Capillary Latest Ref Range: 70 - 99 mg/dL 188 (H) 255 (H) 313 (H) 311 (H) 159 (H)   Diabetes history: DM 2 Outpatient Diabetes medications: Lantus 20 units, Metformin 1000 mg bid Current orders for Inpatient glycemic control:  Lantus 20 units Novolog 0-9 units tid + hs  PO Prednisone 20 mg Daily  Inpatient Diabetes Program Recommendations:    Fasting glucose better with steroid taper. Pt may benefit from meal coverage if glucose increases after meals.  Novolog 2 units tid if eating >50%.  Thanks, Tama Headings RN, MSN, BC-ADM Inpatient Diabetes Coordinator Team Pager [phone] (8a-5p)

## 2019-01-31 NOTE — Progress Notes (Signed)
[facility] Progress Note     Subjective: CC: feels "yucky" Just not feeling very well this AM. Still having some epigastric abdominal pain. Denies nausea this AM.  Nephrology plans to hold off on further dialysis for now.   Objective: Vital signs in last 24 hours: Temp:  [97.4 F (36.3 C)-98.8 F (37.1 C)] 98.8 F (37.1 C) (11/17 0512) Pulse Rate:  [58-62] 58 (11/17 0512) Resp:  [14-18] 14 (11/17 0512) BP: (129-160)/(70-90) 149/76 (11/17 0901) SpO2:  [93 %-98 %] 95 % (11/17 0512) Weight:  [161 kg] 161 kg (11/17 0512) Last BM Date: 01/30/19  Intake/Output from previous day: 11/16 0701 - 11/17 0700 In: 1020 [P.O.:720; IV Piggyback:300] Out: 2925 [Urine:2925] Intake/Output this shift: No intake/output data recorded.  PE: Gen:  Alert, NAD, pleasant Card:  Regular rate and rhythm Pulm:  Normal effort, clear to auscultation bilaterally Abd: Soft, ttp in epigastrium with more guarding today, slightly distended, +BS Skin: warm and dry, no rashes  Psych: A&Ox3   Lab Results:  Recent Labs    01/30/19 0306  HGB 12.5*  HCT 39.8   BMET Recent Labs    01/30/19 0306 01/31/19 0350  NA 136 137  K 4.1 4.0  CL 99 101  CO2 19* 19*  GLUCOSE 211* 215*  BUN 93* 104*  CREATININE 7.57* 7.30*  CALCIUM 8.5* 8.5*   PT/INR No results for input(s): LABPROT, INR in the last 72 hours. CMP     Component Value Date/Time   NA 137 01/31/2019 0350   NA 139 02/12/2015 1424   K 4.0 01/31/2019 0350   CL 101 01/31/2019 0350   CO2 19 (L) 01/31/2019 0350   GLUCOSE 215 (H) 01/31/2019 0350   BUN 104 (H) 01/31/2019 0350   BUN 15 02/12/2015 1424   CREATININE 7.30 (H) 01/31/2019 0350   CREATININE 1.14 10/01/2016 1504   CALCIUM 8.5 (L) 01/31/2019 0350   PROT 6.8 01/27/2019 0429   PROT 6.7 10/11/2014 0833   ALBUMIN 2.1 (L) 01/31/2019 0350   ALBUMIN 3.8 10/11/2014 0833   AST 20 01/27/2019 0429   ALT 32 01/27/2019 0429   ALKPHOS 115 01/27/2019 0429   BILITOT 1.2 01/27/2019  0429   BILITOT 0.4 10/11/2014 0833   GFRNONAA 7 (L) 01/31/2019 0350   GFRNONAA 67 10/01/2016 1504   GFRAA 8 (L) 01/31/2019 0350   GFRAA 78 10/01/2016 1504   Lipase     Component Value Date/Time   LIPASE 38 01/24/2019 0150       Studies/Results: Vas [place] Upper Extremity Venous Duplex  Result Date: 01/29/2019 UPPER VENOUS STUDY  Indications: Edema Limitations: Line in right neck. Comparison Study: No prior study on file Performing Technologist: Sharion Dove RVS  Examination Guidelines: A complete evaluation includes B-mode imaging, spectral Doppler, color Doppler, and power Doppler as needed of all accessible portions of each vessel. Bilateral testing is considered an integral part of a complete examination. Limited examinations for reoccurring indications may be performed as noted.  Right Findings: +----------+------------+---------+-----------+----------+-------+ RIGHT     CompressiblePhasicitySpontaneousPropertiesSummary +----------+------------+---------+-----------+----------+-------+ IJV           Full       Yes       Yes                      +----------+------------+---------+-----------+----------+-------+ Subclavian    Full       Yes       Yes                      +----------+------------+---------+-----------+----------+-------+  Axillary      Full       Yes       Yes                      +----------+------------+---------+-----------+----------+-------+ Brachial      Full       Yes       Yes                      +----------+------------+---------+-----------+----------+-------+ Radial        Full                                          +----------+------------+---------+-----------+----------+-------+ Ulnar         Full                                          +----------+------------+---------+-----------+----------+-------+ Cephalic      Full                                           +----------+------------+---------+-----------+----------+-------+ Basilic       Full                                          +----------+------------+---------+-----------+----------+-------+  Left Findings: +----------+------------+---------+-----------+----------+-------+ LEFT      CompressiblePhasicitySpontaneousPropertiesSummary +----------+------------+---------+-----------+----------+-------+ Subclavian    Full       Yes       Yes                      +----------+------------+---------+-----------+----------+-------+  Summary:  Right: No evidence of deep vein thrombosis in the upper extremity. No evidence of superficial vein thrombosis in the upper extremity.  Left: No evidence of thrombosis in the subclavian.  *See table(s) above for measurements and observations.  Diagnosing physician: Monica Martinez MD Electronically signed by Monica Martinez MD on 01/29/2019 at 2:36:24 PM.    Final     Anti-infectives: Anti-infectives (From admission, onward)   Start     Dose/Rate Route Frequency Ordered Stop   01/31/19 1000  Ampicillin-Sulbactam (UNASYN) 3 g in sodium chloride 0.9 % 100 mL IVPB     3 g 200 mL/hr over 30 Minutes Intravenous Every 24 hours 01/31/19 0859     01/23/19 1400  piperacillin-tazobactam (ZOSYN) IVPB 2.25 g  Status:  Discontinued     2.25 g 100 mL/hr over 30 Minutes Intravenous Every 8 hours 01/23/19 0717 01/23/19 0947   01/23/19 1400  cefTRIAXone (ROCEPHIN) 2 g in sodium chloride 0.9 % 100 mL IVPB  Status:  Discontinued     2 g 200 mL/hr over 30 Minutes Intravenous Every 24 hours 01/23/19 0947 01/31/19 0857   01/23/19 1400  metroNIDAZOLE (FLAGYL) IVPB 500 mg  Status:  Discontinued     500 mg 100 mL/hr over 60 Minutes Intravenous Every 8 hours 01/23/19 0947 01/31/19 0857   01/21/19 1400  piperacillin-tazobactam (ZOSYN) IVPB 3.375 g  Status:  Discontinued     3.375 g 12.5 mL/hr over 240 Minutes Intravenous Every 8 hours 01/21/19 1325 01/23/19 0717  Assessment/Plan Sepsis/shock Respiratory failure with hypoxia Elevated LFTs- LFTs and Tbili all normalized on 11/13 Uncontrolled type 2 diabetes with diabetic neuropathy Hypertension GERD/Hx GI bleed AKI: creatinine7.3, making urine, intermittent HD per renal  Hx AF Hx alcoholism/Gastric bypass Morbid obesity BMI 53.8  Pancreatitis, abdominal pain, cholelithiasis and jaundice - MRI 11/8 shows marked peripancreatic fluid along with pain head related pancreatitis, cholelithiasis with mild gallbladder wall thickening intrahepatic or extrahepatic dilatation 4 mm - patient pain still present and a little more distended this AM - no jaundice  - Will continue to follow to assess surgical readiness    [id number] 11/7-11/9; Rocephin/Flagyl 11/9 >> DVT: SQ heparin Follow up: TBD  LOS: 11 days    Brigid Re , [facility] Surgery 01/31/2019, 9:20 AM Please see Amion for pager number during day hours 7:00am-4:30pm

## 2019-02-01 LAB — GLUCOSE, CAPILLARY
Glucose-Capillary: 173 mg/dL — ABNORMAL HIGH (ref 70–99)
Glucose-Capillary: 155 mg/dL — ABNORMAL HIGH (ref 70–99)
Glucose-Capillary: 181 mg/dL — ABNORMAL HIGH (ref 70–99)
Glucose-Capillary: 137 mg/dL — ABNORMAL HIGH (ref 70–99)

## 2019-02-01 LAB — CBC
HCT: 41.7 % (ref 39.0–52.0)
Hemoglobin: 13.2 g/dL (ref 13.0–17.0)
MCH: 28.6 pg (ref 26.0–34.0)
MCHC: 31.7 g/dL (ref 30.0–36.0)
MCV: 90.5 fL (ref 80.0–100.0)
Platelets: 409 10*3/uL — ABNORMAL HIGH (ref 150–400)
RBC: 4.61 MIL/uL (ref 4.22–5.81)
RDW: 16.6 % — ABNORMAL HIGH (ref 11.5–15.5)
WBC: 18.4 10*3/uL — ABNORMAL HIGH (ref 4.0–10.5)
nRBC: 0 % (ref 0.0–0.2)

## 2019-02-01 LAB — RENAL FUNCTION PANEL
Albumin: 2.3 g/dL — ABNORMAL LOW (ref 3.5–5.0)
Anion gap: 19 — ABNORMAL HIGH (ref 5–15)
BUN: 110 mg/dL — ABNORMAL HIGH (ref 8–23)
CO2: 19 mmol/L — ABNORMAL LOW (ref 22–32)
Calcium: 8.6 mg/dL — ABNORMAL LOW (ref 8.9–10.3)
Chloride: 101 mmol/L (ref 98–111)
Creatinine, Ser: 7.01 mg/dL — ABNORMAL HIGH (ref 0.61–1.24)
GFR calc Af Amer: 9 mL/min — ABNORMAL LOW (ref >60)
GFR calc non Af Amer: 7 mL/min — ABNORMAL LOW (ref >60)
Glucose, Bld: 164 mg/dL — ABNORMAL HIGH (ref 70–99)
Phosphorus: 9.6 mg/dL — ABNORMAL HIGH (ref 2.5–4.6)
Potassium: 3.8 mmol/L (ref 3.5–5.1)
Sodium: 139 mmol/L (ref 135–145)

## 2019-02-01 MED ORDER — HEPARIN SODIUM (PORCINE) 1000 UNIT/ML IJ SOLN
INTRAMUSCULAR | Status: AC
  Administered 2019-02-01: 2400 [IU] via INTRAVENOUS_CENTRAL
  Filled 2019-02-01: qty 3

## 2019-02-01 MED ORDER — FUROSEMIDE 10 MG/ML IJ SOLN
80.0000 mg | Freq: Once | INTRAMUSCULAR | Status: AC
  Administered 2019-02-01: 80 mg via INTRAVENOUS
  Filled 2019-02-01: qty 8

## 2019-02-01 MED ORDER — HYDROMORPHONE HCL 1 MG/ML IJ SOLN
INTRAMUSCULAR | Status: AC
  Administered 2019-02-01: 1 mg via INTRAVENOUS
  Filled 2019-02-01: qty 1

## 2019-02-01 NOTE — Progress Notes (Signed)
Patient ID: Phillip Maldonado, male   DOB: 22-Jul-1950, 68 y.o.   MRN: [id number]  PROGRESS NOTE    Phillip Maldonado  [name] DOB: 05/08/1950 DOA: 01/20/2019 PCP: Patient, No Pcp Per   Brief Narrative:  68 year old male with history of gastric bypass in the 70s, repeated; hypertension, diabetes mellitus type 2, paroxysmal A. fib, chronic abdominal pain, recent treatment for upper GI bleeding presented with acute abdominal pain.  He was admitted for gallstone pancreatitis.  He was noted to have septic shock on 01/21/2019 and was transferred to ICU.  He was eventually weaned off of pressors and transferred back to [facility] service.  MRCP showed acute pancreatitis, cholelithiasis, no CBD obstruction.  General surgery was consulted.  He was also found to have worsening creatinine.  Nephrology was consulted.  He needed temporary hemodialysis.  Assessment & Plan:   Septic shock: Present/evolving on admission Acute cholecystitis Acute gallstone pancreatitis -Initially required ICU care and vasopressors.  After blood pressure improved, he was transferred to the floor under [facility] care. -MRCP showed mild peripancreatic fluid along pancreatic head/uncinate process related to acute pancreatitis.  No fluid collection or pseudocyst.  Cholelithiasis -HIDA scan was ordered: Patient refused -GI was consulted: Recommended outpatient follow-up with Dr. Autumn Patty in 2 to 4 weeks -Was on Rocephin and Flagyl: We will transition to Unasyn -Blood cultures on 01/21/2019: Anaerobic bottle only with gram-negative rods: Bacteroides vulgatus -General surgery following: Probable plan for cholecystectomy tomorrow  Acute kidney injury on chronic kidney disease stage III -developed AKI in the setting of septic shock.  Creatinine peaked to 8.26 -Renal ultrasound showed no obstruction.  Urine culture showed no growth.   -Nephrology following : Status post HD x2, last HD was on 01/27/2019.  Currently HD on hold.  Getting  intravenous Lasix.  Follow urine output.  Acute respiratory failure with hypoxia -Due to combination of COPD/heart failure/OSA -Not on oxygen at home -Currently on room air  Essential hypertension -Blood pressure currently stable.  Continue amlodipine and metoprolol  Hyperlipidemia -Statin on hold due to elevated LFTs  Chronic diastolic CHF -Echo showed EF of 60 to 65% with grade 1 diastolic dysfunction -Nephrology following and managing volume.  Continue Lasix Intermittently.  Strict input and output and daily weights.  Diabetes mellitus type 2 uncontrolled with hyperglycemia -Continue Lantus.  Continue CBGs with SSI.  Left knee pain --Patient was supposed to have some type of procedure few months ago however this was delayed as his wife had Covid. -Orthopedics consulted and appreciated, status post left knee aspiration and injection (under 90 mL of clear fluid was obtained, 6 mL of 0.5% Marcaine and 80 mg of Depo-Medrol injected)  GERD/history of GI bleed -Continue PPI  History of paroxysmal A. Fib -Currently rate controlled and in sinus rhythm.  Not on anticoagulation as an outpatient.  Continue metoprolol.  Morbid obesity  -outpatient follow-up  Generalized deconditioning -Plan will need PT eval once surgery is done   DVT prophylaxis: Heparin Code Status: Full Family Communication: Spoke to patient at bedside Disposition Plan: Depends on clinical outcome  Consultants: PCCM/general surgery/nephrology/gastroenterology's orthopedic surgery  Procedures:  Echo IMPRESSIONS    1. Left ventricular ejection fraction, by visual estimation, is 60 to 65%. The left ventricle has normal function. There is no left ventricular hypertrophy.  2. Left ventricular diastolic parameters are consistent with Grade I diastolic dysfunction (impaired relaxation).  3. Global right ventricle has normal systolic function.The right ventricular size is normal. No increase in right  ventricular wall thickness.  4. Left atrial size was mildly dilated.  5. Right atrial size was normal.  6. Moderate mitral annular calcification.  7. The mitral valve is normal in structure. No evidence of mitral valve regurgitation. No evidence of mitral stenosis.  8. The tricuspid valve is normal in structure. Tricuspid valve regurgitation is not demonstrated.  9. The aortic valve is tricuspid. Aortic valve regurgitation is not visualized. Mild aortic valve sclerosis without stenosis. 10. The pulmonic valve was normal in structure. Pulmonic valve regurgitation is not visualized. 11. The inferior vena cava is normal in size with greater than 50% respiratory variability, suggesting right atrial pressure of 3 mmHg. 12. The interatrial septum was not well visualized.  Left knee aspiration and injection  Insertion of right IJ nontunneled HD cath   Antimicrobials:  Anti-infectives (From admission, onward)   Start     Dose/Rate Route Frequency Ordered Stop   01/31/19 2200  Ampicillin-Sulbactam (UNASYN) 3 g in sodium chloride 0.9 % 100 mL IVPB     3 g 200 mL/hr over 30 Minutes Intravenous Every 12 hours 01/31/19 1051     01/31/19 1000  Ampicillin-Sulbactam (UNASYN) 3 g in sodium chloride 0.9 % 100 mL IVPB  Status:  Discontinued     3 g 200 mL/hr over 30 Minutes Intravenous Every 24 hours 01/31/19 0859 01/31/19 1051   01/23/19 1400  piperacillin-tazobactam (ZOSYN) IVPB 2.25 g  Status:  Discontinued     2.25 g 100 mL/hr over 30 Minutes Intravenous Every 8 hours 01/23/19 0717 01/23/19 0947   01/23/19 1400  cefTRIAXone (ROCEPHIN) 2 g in sodium chloride 0.9 % 100 mL IVPB  Status:  Discontinued     2 g 200 mL/hr over 30 Minutes Intravenous Every 24 hours 01/23/19 0947 01/31/19 0857   01/23/19 1400  metroNIDAZOLE (FLAGYL) IVPB 500 mg  Status:  Discontinued     500 mg 100 mL/hr over 60 Minutes Intravenous Every 8 hours 01/23/19 0947 01/31/19 0857   01/21/19 1400  piperacillin-tazobactam (ZOSYN)  IVPB 3.375 g  Status:  Discontinued     3.375 g 12.5 mL/hr over 240 Minutes Intravenous Every 8 hours 01/21/19 1325 01/23/19 0717      Subjective: Patient seen and examined at bedside.  Complains of mild abdominal pain.  Poor historian.  No overnight fever, nausea or vomiting.  Objective: Vitals:   01/31/19 0901 01/31/19 1401 01/31/19 2122 02/01/19 0514  BP: (!) 149/76 123/66 (!) 142/83 136/85  Pulse:  66 62 (!) 56  Resp:  15 16 14   Temp:  97.6 F (36.4 C) 98 F (36.7 C) 97.9 F (36.6 C)  TempSrc:  Oral Oral Oral  SpO2:  96% 96% 96%  Weight:    (!) 158.8 kg  Height:        Intake/Output Summary (Last 24 hours) at 02/01/2019 1112 Last data filed at 02/01/2019 0515 Gross per 24 hour  Intake 920 ml  Output 4100 ml  Net -3180 ml   Filed Weights   01/30/19 0537 01/31/19 0512 02/01/19 0514  Weight: (!) 163.3 kg (!) 161 kg (!) 158.8 kg    Examination:  General exam: Looks chronically ill.  No distress. Respiratory system: Bilateral decreased breath sounds at bases with basilar crackles Cardiovascular system: S1 & S2 heard, Rate controlled Gastrointestinal system: Abdomen is nondistended, soft and mild right upper quadrant tenderness present. Normal bowel sounds heard. Extremities: No cyanosis, clubbing; trace bilateral lower extremity edema.  Right upper extremity edema present Central nervous system: Alert and oriented.  Poor historian.  No focal neurological deficits. Moving extremities Skin: No rashes, lesions or ulcers Psychiatry: Flat affect.   Data Reviewed: I have personally reviewed following labs and imaging studies  CBC: Recent Labs  Lab 01/27/19 0429 01/30/19 0306 02/01/19 0450  WBC 11.2*  --  18.4*  HGB 11.2* 12.5* 13.2  HCT 35.0* 39.8 41.7  MCV 88.8  --  90.5  PLT 271  --  [id number]   Basic Metabolic Panel: Recent Labs  Lab 01/28/19 0428 01/29/19 0329 01/30/19 0306 01/31/19 0350 02/01/19 0450  NA 135 136 136 137 139  K 4.3 4.5 4.1 4.0 3.8  CL  98 100 99 101 101  CO2 21* 21* 19* 19* 19*  GLUCOSE 188* 226* 211* 215* 164*  BUN 57* 76* 93* 104* 110*  CREATININE 6.74* 7.33* 7.57* 7.30* 7.01*  CALCIUM 8.4* 8.5* 8.5* 8.5* 8.6*  PHOS 6.1* 7.9* 8.8* 9.5* 9.6*   GFR: Estimated Creatinine Clearance: 15.3 mL/min (A) (by C-G formula based on SCr of 7.01 mg/dL (H)). Liver Function Tests: Recent Labs  Lab 01/27/19 0429 01/28/19 0428 01/29/19 0329 01/30/19 0306 01/31/19 0350 02/01/19 0450  AST 20  --   --   --   --   --   ALT 32  --   --   --   --   --   ALKPHOS 115  --   --   --   --   --   BILITOT 1.2  --   --   --   --   --   PROT 6.8  --   --   --   --   --   ALBUMIN 2.0* 2.1* 2.0* 2.0* 2.1* 2.3*   No results for input(s): LIPASE, AMYLASE in the last 168 hours. No results for input(s): AMMONIA in the last 168 hours. Coagulation Profile: No results for input(s): INR, PROTIME in the last 168 hours. Cardiac Enzymes: No results for input(s): CKTOTAL, CKMB, CKMBINDEX, TROPONINI in the last 168 hours. BNP (last 3 results) No results for input(s): PROBNP in the last 8760 hours. HbA1C: No results for input(s): HGBA1C in the last 72 hours. CBG: Recent Labs  Lab 01/31/19 0828 01/31/19 1234 01/31/19 1638 01/31/19 2124 02/01/19 0803  GLUCAP 159* 180* 317* 270* 173*   Lipid Profile: No results for input(s): CHOL, HDL, LDLCALC, TRIG, CHOLHDL, LDLDIRECT in the last 72 hours. Thyroid Function Tests: No results for input(s): TSH, T4TOTAL, FREET4, T3FREE, THYROIDAB in the last 72 hours. Anemia Panel: No results for input(s): VITAMINB12, FOLATE, FERRITIN, TIBC, IRON, RETICCTPCT in the last 72 hours. Sepsis Labs: No results for input(s): PROCALCITON, LATICACIDVEN in the last 168 hours.  Recent Results (from the past 240 hour(s))  Culture, Urine     Status: None   Collection Time: 01/23/19  2:02 PM   Specimen: Urine, Random  Result Value Ref Range Status   Specimen Description   Final    URINE, RANDOM Performed at [facility] [address], [place], [place] [address]    Special Requests   Final    NONE Performed at [facility], [place] [address], [place], [place] [address]    Culture   Final    NO GROWTH Performed at [facility], [place] [address] [place], [place] [address]    Report Status 01/24/2019 FINAL  Final  Body fluid culture     Status: None   Collection Time: 01/26/19 12:43 PM   Specimen: Body Fluid  Result Value  Ref Range Status   Specimen Description FLUID  Final   Special Requests SYNOVIAL KNEE  Final   Gram Stain   Final    WBC PRESENT,BOTH PMN AND MONONUCLEAR NO ORGANISMS SEEN CYTOSPIN SMEAR    Culture   Final    NO GROWTH 3 DAYS Performed at [facility], [address], [place], [place] [address]    Report Status 01/29/2019 FINAL  Final         Radiology Studies: No results found.      Scheduled Meds: . amLODipine  5 mg Oral Daily  . Chlorhexidine Gluconate Cloth  6 each Topical Daily  . DULoxetine  60 mg Oral Daily  . furosemide  80 mg Intravenous Once  . heparin injection (subcutaneous)  5,000 Units Subcutaneous Q8H  . insulin aspart  0-5 Units Subcutaneous QHS  . insulin aspart  0-9 Units Subcutaneous TID WC  . insulin glargine  20 Units Subcutaneous Q2200  . mouth rinse  15 mL Mouth Rinse BID  . metoprolol succinate  100 mg Oral Daily  . pantoprazole  40 mg Oral Daily  . predniSONE  20 mg Oral Q breakfast  . simethicone  80 mg Oral QID  . sodium chloride flush  10-40 mL Intracatheter Q12H  . thiamine  100 mg Oral Daily  . traZODone  300 mg Oral QHS   Continuous Infusions: . ampicillin-sulbactam (UNASYN) IV Stopped (01/31/19 2327)          Aline August, MD [facility] 02/01/2019, 11:12 AM

## 2019-02-01 NOTE — Progress Notes (Signed)
[facility]/Trauma Progress Note      Assessment/Plan Sepsis/shock Respiratory failure with hypoxia Elevated LFTs-LFTs and Tbili all normalized on 11/13 Uncontrolled type 2 diabetes with diabetic neuropathy Hypertension GERD/Hx GI bleed AKI: intermittent HD per renal Hx AF Hx alcoholism/Gastric bypass Morbid obesity BMI 53.8  Pancreatitis, abdominal pain, cholelithiasis and jaundice - MRI 11/8 shows marked peripancreatic fluid along with pain head related pancreatitis, cholelithiasis with mild gallbladder wall thickening intrahepatic or extrahepatic dilatation 4 mm - no jaundice   FEN:CLD, NPO at midnight [id number] 11/7-11/9; Rocephin/Flagyl 11/9-11/17; Unasyn 11/17>> [id number] Follow up: TBD  Plan: possible OR tomorrow pending abdominal exam, NPO at midnight   LOS: 12 days    Subjective: CC: abdominal bloating  Pt is not having much pain but feels very full and bloated of his upper abdomen. No issues overnight.   Objective: Vital signs in last 24 hours: Temp:  [97.6 F (36.4 C)-98 F (36.7 C)] 97.9 F (36.6 C) (11/18 0514) Pulse Rate:  [56-66] 56 (11/18 0514) Resp:  [14-16] 14 (11/18 0514) BP: (123-149)/(66-85) 136/85 (11/18 0514) SpO2:  [96 %] 96 % (11/18 0514) Weight:  [158.8 kg] 158.8 kg (11/18 0514) Last BM Date: 01/31/19  Intake/Output from previous day: 11/17 0701 - 11/18 0700 In: 1160 [P.O.:960; IV Piggyback:200] Out: 4100 [Urine:4100] Intake/Output this shift: No intake/output data recorded.  PE:  Gen:  Alert, NAD, pleasant Pulm:  rate and effort normal Abd: Soft, obese, very mild TTP epigastrium today, slightly distended Skin: warm and dry Psych: A&Ox3   Anti-infectives: Anti-infectives (From admission, onward)   Start     Dose/Rate Route Frequency Ordered Stop   01/31/19 2200  Ampicillin-Sulbactam (UNASYN) 3 g in sodium chloride 0.9 % 100 mL IVPB     3 g 200 mL/hr over 30 Minutes Intravenous Every 12 hours  01/31/19 1051     01/31/19 1000  Ampicillin-Sulbactam (UNASYN) 3 g in sodium chloride 0.9 % 100 mL IVPB  Status:  Discontinued     3 g 200 mL/hr over 30 Minutes Intravenous Every 24 hours 01/31/19 0859 01/31/19 1051   01/23/19 1400  piperacillin-tazobactam (ZOSYN) IVPB 2.25 g  Status:  Discontinued     2.25 g 100 mL/hr over 30 Minutes Intravenous Every 8 hours 01/23/19 0717 01/23/19 0947   01/23/19 1400  cefTRIAXone (ROCEPHIN) 2 g in sodium chloride 0.9 % 100 mL IVPB  Status:  Discontinued     2 g 200 mL/hr over 30 Minutes Intravenous Every 24 hours 01/23/19 0947 01/31/19 0857   01/23/19 1400  metroNIDAZOLE (FLAGYL) IVPB 500 mg  Status:  Discontinued     500 mg 100 mL/hr over 60 Minutes Intravenous Every 8 hours 01/23/19 0947 01/31/19 0857   01/21/19 1400  piperacillin-tazobactam (ZOSYN) IVPB 3.375 g  Status:  Discontinued     3.375 g 12.5 mL/hr over 240 Minutes Intravenous Every 8 hours 01/21/19 1325 01/23/19 0717      Lab Results:  Recent Labs    01/30/19 0306 02/01/19 0450  WBC  --  18.4*  HGB 12.5* 13.2  HCT 39.8 41.7  PLT  --  409*   BMET Recent Labs    01/31/19 0350 02/01/19 0450  NA 137 139  K 4.0 3.8  CL 101 101  CO2 19* 19*  GLUCOSE 215* 164*  BUN 104* 110*  CREATININE 7.30* 7.01*  CALCIUM 8.5* 8.6*   PT/INR No results for input(s): LABPROT, INR in the last 72 hours. CMP     Component Value Date/Time  NA 139 02/01/2019 0450   NA 139 02/12/2015 1424   K 3.8 02/01/2019 0450   CL 101 02/01/2019 0450   CO2 19 (L) 02/01/2019 0450   GLUCOSE 164 (H) 02/01/2019 0450   BUN 110 (H) 02/01/2019 0450   BUN 15 02/12/2015 1424   CREATININE 7.01 (H) 02/01/2019 0450   CREATININE 1.14 10/01/2016 1504   CALCIUM 8.6 (L) 02/01/2019 0450   PROT 6.8 01/27/2019 0429   PROT 6.7 10/11/2014 0833   ALBUMIN 2.3 (L) 02/01/2019 0450   ALBUMIN 3.8 10/11/2014 0833   AST 20 01/27/2019 0429   ALT 32 01/27/2019 0429   ALKPHOS 115 01/27/2019 0429   BILITOT 1.2 01/27/2019 0429    BILITOT 0.4 10/11/2014 0833   GFRNONAA 7 (L) 02/01/2019 0450   GFRNONAA 67 10/01/2016 1504   GFRAA 9 (L) 02/01/2019 0450   GFRAA 78 10/01/2016 1504   Lipase     Component Value Date/Time   LIPASE 38 01/24/2019 0150    Studies/Results: No results found.   Kalman Drape, PA-C [facility] Surgery Please see amion for pager for the following: Myna Hidalgo, W, & Friday 7:00am - 4:30pm Thursdays 7:00am -11:30am

## 2019-02-01 NOTE — Progress Notes (Signed)
[facility] Progress Note    Assessment/ Plan:   1. Acute renal failure - combination of IV contrast/ hypotension-shock w/  ARB/ACEi on board. Creat worsened and is now sp HD x 2, last Friday 11/13. Evidence of incipient recovery and is making good UOP on Lasix- however BUN up and he has some sleepiness/ abd pain/ bloating--> will give a short session of HD today without fluid removal (just cleaning) to remove uremia from the equation and optimize labs for surgery tomorrow.  Continue IV Lasix  2. Septic shock/ abd pain - resolved, suspected gallstone pancreatitis, surgery consulted--> to OR tomorrow.  On Rocephin/ Flagyl.    3. Chronic pain  4. Depression  5. Chronic CHF-- some volume overload which is improved with IV Lasix   Subjective:    Seen in room.  Good UOP but feels awful today, WBC ct up to 18.4.  BUN at 110.  Cr inching downwards.  Noted plans for lap chole and cholangiogram tomorrow.     Objective:   BP 136/85 (BP Location: Left Arm)   Pulse (!) 56   Temp 97.9 F (36.6 C) (Oral)   Resp 14   Ht 5\' 9"  (1.753 m)   Wt (!) 158.8 kg   SpO2 96%   BMI 51.70 kg/m   Intake/Output Summary (Last 24 hours) at 02/01/2019 1014 Last data filed at 02/01/2019 0515 Gross per 24 hour  Intake 920 ml  Output 4100 ml  Net -3180 ml   Weight change: -2.2 kg  Physical Exam: Gen: appears uncomfortable CVS: RRR no m/r/g Resp: clear bilaterally no c/w/r Abd: soft, RUQ tenderness a little worse, hypoactive BS Ext: 1+ anasarca, improved Dialysis Access: R IJ nontunneled HD cath  Imaging: No results found.  Labs: BMET Recent Labs  Lab 01/26/19 0437 01/27/19 0429 01/28/19 0428 01/29/19 0329 01/30/19 0306 01/31/19 0350 02/01/19 0450  NA 136 133* 135 136 136 137 139  K 4.3 4.6 4.3 4.5 4.1 4.0 3.8  CL 99 98 98 100 99 101 101  CO2 20* 19* 21* 21* 19* 19* 19*  GLUCOSE 187* 194* 188* 226* 211* 215* 164*  BUN 54* 70* 57* 76* 93* 104* 110*  CREATININE 7.31*  8.40* 6.74* 7.33* 7.57* 7.30* 7.01*  CALCIUM 8.3* 8.3* 8.4* 8.5* 8.5* 8.5* 8.6*  PHOS  --   --  6.1* 7.9* 8.8* 9.5* 9.6*   CBC Recent Labs  Lab 01/27/19 0429 01/30/19 0306 02/01/19 0450  WBC 11.2*  --  18.4*  HGB 11.2* 12.5* 13.2  HCT 35.0* 39.8 41.7  MCV 88.8  --  90.5  PLT 271  --  409*    Medications:    . amLODipine  5 mg Oral Daily  . Chlorhexidine Gluconate Cloth  6 each Topical Daily  . DULoxetine  60 mg Oral Daily  . heparin injection (subcutaneous)  5,000 Units Subcutaneous Q8H  . insulin aspart  0-5 Units Subcutaneous QHS  . insulin aspart  0-9 Units Subcutaneous TID WC  . insulin glargine  20 Units Subcutaneous Q2200  . mouth rinse  15 mL Mouth Rinse BID  . metoprolol succinate  100 mg Oral Daily  . pantoprazole  40 mg Oral Daily  . predniSONE  20 mg Oral Q breakfast  . simethicone  80 mg Oral QID  . sodium chloride flush  10-40 mL Intracatheter Q12H  . thiamine  100 mg Oral Daily  . traZODone  300 mg Oral QHS      Madelon Lips MD 02/01/2019,  10:14 AM

## 2019-02-01 NOTE — Anesthesia Preprocedure Evaluation (Addendum)
Anesthesia Evaluation  Patient identified by MRN, date of birth, ID band Patient awake    Reviewed: Allergy & Precautions, NPO status , Patient's Chart, lab work & pertinent test results  History of Anesthesia Complications Negative for: history of anesthetic complications  Airway Mallampati: III  TM Distance: >3 FB Neck ROM: Full    Dental  (+) Missing, Poor Dentition, Dental Advisory Given   Pulmonary sleep apnea ,    Pulmonary exam normal        Cardiovascular hypertension, Pt. on medications and Pt. on home beta blockers + Peripheral Vascular Disease  Normal cardiovascular exam+ dysrhythmias Atrial Fibrillation    '20 TTE - EF 60 to 65%. Grade I diastolic dysfunction (impaired relaxation). LA was mildly dilated. Mild aortic valve sclerosis without stenosis.    Neuro/Psych  Headaches, PSYCHIATRIC DISORDERS Anxiety Depression  Neuromuscular disease (upper extremity neuropathy)    GI/Hepatic negative GI ROS, (+) Hepatitis -, B  Endo/Other  diabetes, Type 2, Oral Hypoglycemic Agents, Insulin DependentMorbid obesity  Renal/GU CRF, ARF and DialysisRenal disease     Musculoskeletal  (+) Arthritis ,   Abdominal (+) + obese,   Peds  Hematology negative hematology ROS (+)   Anesthesia Other Findings   Reproductive/Obstetrics                            Anesthesia Physical Anesthesia Plan  ASA: III  Anesthesia Plan: General   Post-op Pain Management:    Induction: Intravenous  PONV Risk Score and Plan: 4 or greater and Treatment may vary due to age or medical condition, Ondansetron and Dexamethasone  Airway Management Planned: Oral ETT  Additional Equipment: None  Intra-op Plan:   Post-operative Plan: Extubation in OR  Informed Consent: I have reviewed the patients History and Physical, chart, labs and discussed the procedure including the risks, benefits and alternatives for the  proposed anesthesia with the patient or authorized representative who has indicated his/her understanding and acceptance.     Dental advisory given  Plan Discussed with: CRNA and Anesthesiologist  Anesthesia Plan Comments:        Anesthesia Quick Evaluation

## 2019-02-01 NOTE — Progress Notes (Signed)
RN requested Trazodone 300 mg from pharmacy. Pharmacy sent 150 mg of Trazodone. Contacted pharmacy about the additional 150 mg of Trazodone. Pharmacy sent another dose of Trazodone 300 mg. Leaving an extra   dose of Trazodone 150 mg. Will place in the credit box in pyxis room 2.

## 2019-02-02 ENCOUNTER — Inpatient Hospital Stay (HOSPITAL_COMMUNITY): Payer: Medicare Other | Admitting: Anesthesiology

## 2019-02-02 ENCOUNTER — Encounter (HOSPITAL_COMMUNITY)
Admission: EM | Disposition: A | Discharge status: Home Health | Payer: Self-pay | Source: Home / Self Care | Attending: Internal Medicine

## 2019-02-02 HISTORY — PX: CHOLECYSTECTOMY: SHX55

## 2019-02-02 LAB — MAGNESIUM: Magnesium: 2 mg/dL (ref 1.7–2.4)

## 2019-02-02 LAB — COMPREHENSIVE METABOLIC PANEL
ALT: 12 U/L (ref 0–44)
AST: 14 U/L — ABNORMAL LOW (ref 15–41)
Albumin: 2.4 g/dL — ABNORMAL LOW (ref 3.5–5.0)
Alkaline Phosphatase: 81 U/L (ref 38–126)
Anion gap: 14 (ref 5–15)
BUN: 64 mg/dL — ABNORMAL HIGH (ref 8–23)
CO2: 23 mmol/L (ref 22–32)
Calcium: 8.3 mg/dL — ABNORMAL LOW (ref 8.9–10.3)
Chloride: 102 mmol/L (ref 98–111)
Creatinine, Ser: 4.74 mg/dL — ABNORMAL HIGH (ref 0.61–1.24)
GFR calc Af Amer: 14 mL/min — ABNORMAL LOW (ref >60)
GFR calc non Af Amer: 12 mL/min — ABNORMAL LOW (ref >60)
Glucose, Bld: 158 mg/dL — ABNORMAL HIGH (ref 70–99)
Potassium: 3.6 mmol/L (ref 3.5–5.1)
Sodium: 139 mmol/L (ref 135–145)
Total Bilirubin: 0.7 mg/dL (ref 0.3–1.2)
Total Protein: 6.6 g/dL (ref 6.5–8.1)

## 2019-02-02 LAB — GLUCOSE, CAPILLARY
Glucose-Capillary: 151 mg/dL — ABNORMAL HIGH (ref 70–99)
Glucose-Capillary: 181 mg/dL — ABNORMAL HIGH (ref 70–99)
Glucose-Capillary: 202 mg/dL — ABNORMAL HIGH (ref 70–99)
Glucose-Capillary: 223 mg/dL — ABNORMAL HIGH (ref 70–99)
Glucose-Capillary: 245 mg/dL — ABNORMAL HIGH (ref 70–99)
Glucose-Capillary: 274 mg/dL — ABNORMAL HIGH (ref 70–99)

## 2019-02-02 LAB — CBC
HCT: 39.4 % (ref 39.0–52.0)
Hemoglobin: 12.3 g/dL — ABNORMAL LOW (ref 13.0–17.0)
MCH: 28.7 pg (ref 26.0–34.0)
MCHC: 31.2 g/dL (ref 30.0–36.0)
MCV: 91.8 fL (ref 80.0–100.0)
Platelets: 449 10*3/uL — ABNORMAL HIGH (ref 150–400)
RBC: 4.29 MIL/uL (ref 4.22–5.81)
RDW: 16.6 % — ABNORMAL HIGH (ref 11.5–15.5)
WBC: 27.7 10*3/uL — ABNORMAL HIGH (ref 4.0–10.5)
nRBC: 0 % (ref 0.0–0.2)

## 2019-02-02 LAB — CBC WITH DIFFERENTIAL/PLATELET
Abs Immature Granulocytes: 0.7 10*3/uL — ABNORMAL HIGH (ref 0.00–0.07)
Basophils Absolute: 0.1 10*3/uL (ref 0.0–0.1)
Basophils Relative: 1 %
Eosinophils Absolute: 0.4 10*3/uL (ref 0.0–0.5)
Eosinophils Relative: 2 %
HCT: 40.8 % (ref 39.0–52.0)
Hemoglobin: 12.8 g/dL — ABNORMAL LOW (ref 13.0–17.0)
Immature Granulocytes: 4 %
Lymphocytes Relative: 10 %
Lymphs Abs: 1.8 10*3/uL (ref 0.7–4.0)
MCH: 28.1 pg (ref 26.0–34.0)
MCHC: 31.4 g/dL (ref 30.0–36.0)
MCV: 89.5 fL (ref 80.0–100.0)
Monocytes Absolute: 1 10*3/uL (ref 0.1–1.0)
Monocytes Relative: 5 %
Neutro Abs: 14.8 10*3/uL — ABNORMAL HIGH (ref 1.7–7.7)
Neutrophils Relative %: 78 %
Platelets: 353 10*3/uL (ref 150–400)
RBC: 4.56 MIL/uL (ref 4.22–5.81)
RDW: 16.5 % — ABNORMAL HIGH (ref 11.5–15.5)
WBC: 18.9 10*3/uL — ABNORMAL HIGH (ref 4.0–10.5)
nRBC: 0 % (ref 0.0–0.2)

## 2019-02-02 SURGERY — LAPAROSCOPIC CHOLECYSTECTOMY WITH INTRAOPERATIVE CHOLANGIOGRAM
Anesthesia: General | Site: Abdomen

## 2019-02-02 MED ORDER — ONDANSETRON HCL 4 MG/2ML IJ SOLN
INTRAMUSCULAR | Status: AC
  Filled 2019-02-02: qty 2

## 2019-02-02 MED ORDER — ONDANSETRON HCL 4 MG/2ML IJ SOLN
INTRAMUSCULAR | Status: DC | PRN
  Administered 2019-02-02: 4 mg via INTRAVENOUS

## 2019-02-02 MED ORDER — KETAMINE HCL 50 MG/5ML IJ SOSY
PREFILLED_SYRINGE | INTRAMUSCULAR | Status: AC
  Filled 2019-02-02: qty 5

## 2019-02-02 MED ORDER — MIDAZOLAM HCL 5 MG/5ML IJ SOLN
INTRAMUSCULAR | Status: DC | PRN
  Administered 2019-02-02: 1 mg via INTRAVENOUS

## 2019-02-02 MED ORDER — LIDOCAINE 2% (20 MG/ML) 5 ML SYRINGE
INTRAMUSCULAR | Status: DC | PRN
  Administered 2019-02-02: 40 mg via INTRAVENOUS

## 2019-02-02 MED ORDER — PHENYLEPHRINE 40 MCG/ML (10ML) SYRINGE FOR IV PUSH (FOR BLOOD PRESSURE SUPPORT)
PREFILLED_SYRINGE | INTRAVENOUS | Status: AC
  Filled 2019-02-02: qty 10

## 2019-02-02 MED ORDER — ROCURONIUM BROMIDE 10 MG/ML (PF) SYRINGE
PREFILLED_SYRINGE | INTRAVENOUS | Status: AC
  Filled 2019-02-02: qty 10

## 2019-02-02 MED ORDER — PROPOFOL 10 MG/ML IV BOLUS
INTRAVENOUS | Status: DC | PRN
  Administered 2019-02-02: 100 mg via INTRAVENOUS

## 2019-02-02 MED ORDER — EPHEDRINE SULFATE-NACL 50-0.9 MG/10ML-% IV SOSY
PREFILLED_SYRINGE | INTRAVENOUS | Status: DC | PRN
  Administered 2019-02-02 (×2): 5 mg via INTRAVENOUS

## 2019-02-02 MED ORDER — HEMOSTATIC AGENTS (NO CHARGE) OPTIME
TOPICAL | Status: DC | PRN
  Administered 2019-02-02 (×2): 1 via TOPICAL

## 2019-02-02 MED ORDER — DEXTROSE 5 % IV SOLN
INTRAVENOUS | Status: DC | PRN
  Administered 2019-02-02: 09:00:00 3 g via INTRAVENOUS

## 2019-02-02 MED ORDER — ALBUMIN HUMAN 5 % IV SOLN
INTRAVENOUS | Status: DC | PRN
  Administered 2019-02-02: 10:00:00 via INTRAVENOUS

## 2019-02-02 MED ORDER — MIDAZOLAM HCL 2 MG/2ML IJ SOLN
INTRAMUSCULAR | Status: AC
  Filled 2019-02-02: qty 2

## 2019-02-02 MED ORDER — HEPARIN SODIUM (PORCINE) 5000 UNIT/ML IJ SOLN
5000.0000 [IU] | Freq: Three times a day (TID) | INTRAMUSCULAR | Status: AC
  Administered 2019-02-03 – 2019-02-06 (×12): 5000 [IU] via SUBCUTANEOUS
  Filled 2019-02-02 (×12): qty 1

## 2019-02-02 MED ORDER — PHENYLEPHRINE HCL-NACL 10-0.9 MG/250ML-% IV SOLN
INTRAVENOUS | Status: DC | PRN
  Administered 2019-02-02: 40 ug/min via INTRAVENOUS

## 2019-02-02 MED ORDER — EPHEDRINE 5 MG/ML INJ
INTRAVENOUS | Status: AC
  Filled 2019-02-02: qty 10

## 2019-02-02 MED ORDER — FENTANYL CITRATE (PF) 100 MCG/2ML IJ SOLN: 25.0000 ug | INTRAMUSCULAR | Status: DC | PRN

## 2019-02-02 MED ORDER — KETAMINE HCL 10 MG/ML IJ SOLN
INTRAMUSCULAR | Status: DC | PRN
  Administered 2019-02-02: 30 mg via INTRAVENOUS

## 2019-02-02 MED ORDER — DEXAMETHASONE SODIUM PHOSPHATE 10 MG/ML IJ SOLN
INTRAMUSCULAR | Status: DC | PRN
  Administered 2019-02-02: 4 mg via INTRAVENOUS

## 2019-02-02 MED ORDER — FENTANYL CITRATE (PF) 250 MCG/5ML IJ SOLN
INTRAMUSCULAR | Status: AC
  Filled 2019-02-02: qty 5

## 2019-02-02 MED ORDER — HYDROMORPHONE 1 MG/ML IV SOLN
INTRAVENOUS | Status: AC
  Filled 2019-02-02: qty 30

## 2019-02-02 MED ORDER — 0.9 % SODIUM CHLORIDE (POUR BTL) OPTIME
TOPICAL | Status: DC | PRN
  Administered 2019-02-02 (×3): 1000 mL

## 2019-02-02 MED ORDER — SODIUM CHLORIDE 0.9 % IV SOLN
INTRAVENOUS | Status: DC
  Administered 2019-02-02 – 2019-02-03 (×3): via INTRAVENOUS

## 2019-02-02 MED ORDER — OXYCODONE HCL 5 MG/5ML PO SOLN: 5.0000 mg | Freq: Once | ORAL | Status: DC | PRN

## 2019-02-02 MED ORDER — FENTANYL CITRATE (PF) 100 MCG/2ML IJ SOLN
INTRAMUSCULAR | Status: DC | PRN
  Administered 2019-02-02 (×2): 50 ug via INTRAVENOUS
  Administered 2019-02-02: 100 ug via INTRAVENOUS

## 2019-02-02 MED ORDER — SODIUM CHLORIDE 0.9 % IV SOLN
INTRAVENOUS | Status: DC
  Administered 2019-02-02 (×2): via INTRAVENOUS

## 2019-02-02 MED ORDER — OXYCODONE HCL 5 MG PO TABS: 5.0000 mg | ORAL_TABLET | Freq: Once | ORAL | Status: DC | PRN

## 2019-02-02 MED ORDER — HYDROMORPHONE HCL 1 MG/ML IJ SOLN
INTRAMUSCULAR | Status: AC
  Filled 2019-02-02: qty 0.5

## 2019-02-02 MED ORDER — BUPIVACAINE-EPINEPHRINE 0.5% -1:200000 IJ SOLN
INTRAMUSCULAR | Status: AC
  Filled 2019-02-02: qty 1

## 2019-02-02 MED ORDER — HYDROMORPHONE 1 MG/ML IV SOLN
INTRAVENOUS | Status: DC
  Administered 2019-02-02: 30 mg via INTRAVENOUS
  Administered 2019-02-03: 13.8 mg via INTRAVENOUS
  Administered 2019-02-03: 1.8 mg via INTRAVENOUS
  Administered 2019-02-04: 2.7 mg via INTRAVENOUS
  Administered 2019-02-04: 2.4 mg via INTRAVENOUS
  Administered 2019-02-04: 30 mg via INTRAVENOUS
  Filled 2019-02-02: qty 30

## 2019-02-02 MED ORDER — CEFAZOLIN SODIUM 1 G IJ SOLR
INTRAMUSCULAR | Status: AC
  Filled 2019-02-02: qty 30

## 2019-02-02 MED ORDER — NALOXONE HCL 0.4 MG/ML IJ SOLN: 0.4000 mg | INTRAMUSCULAR | Status: DC | PRN

## 2019-02-02 MED ORDER — PHENYLEPHRINE 40 MCG/ML (10ML) SYRINGE FOR IV PUSH (FOR BLOOD PRESSURE SUPPORT)
PREFILLED_SYRINGE | INTRAVENOUS | Status: DC | PRN
  Administered 2019-02-02: 80 ug via INTRAVENOUS

## 2019-02-02 MED ORDER — LIDOCAINE 2% (20 MG/ML) 5 ML SYRINGE
INTRAMUSCULAR | Status: AC
  Filled 2019-02-02: qty 5

## 2019-02-02 MED ORDER — SUCCINYLCHOLINE CHLORIDE 200 MG/10ML IV SOSY
PREFILLED_SYRINGE | INTRAVENOUS | Status: AC
  Filled 2019-02-02: qty 10

## 2019-02-02 MED ORDER — ROCURONIUM BROMIDE 50 MG/5ML IV SOSY
PREFILLED_SYRINGE | INTRAVENOUS | Status: DC | PRN
  Administered 2019-02-02 (×3): 20 mg via INTRAVENOUS
  Administered 2019-02-02: 50 mg via INTRAVENOUS
  Administered 2019-02-02: 20 mg via INTRAVENOUS

## 2019-02-02 MED ORDER — HYDROMORPHONE HCL 1 MG/ML IJ SOLN
INTRAMUSCULAR | Status: DC | PRN
  Administered 2019-02-02: 0.5 mg via INTRAVENOUS

## 2019-02-02 MED ORDER — SODIUM CHLORIDE 0.9 % IR SOLN
Status: DC | PRN
  Administered 2019-02-02: 1000 mL

## 2019-02-02 MED ORDER — PROPOFOL 10 MG/ML IV BOLUS
INTRAVENOUS | Status: AC
  Filled 2019-02-02: qty 20

## 2019-02-02 MED ORDER — DEXAMETHASONE SODIUM PHOSPHATE 10 MG/ML IJ SOLN
INTRAMUSCULAR | Status: AC
  Filled 2019-02-02: qty 1

## 2019-02-02 MED ORDER — SUGAMMADEX SODIUM 500 MG/5ML IV SOLN
INTRAVENOUS | Status: AC
  Filled 2019-02-02: qty 5

## 2019-02-02 MED ORDER — SODIUM CHLORIDE 0.9% FLUSH: 9.0000 mL | INTRAVENOUS | Status: DC | PRN

## 2019-02-02 MED ORDER — SUGAMMADEX SODIUM 200 MG/2ML IV SOLN
INTRAVENOUS | Status: DC | PRN
  Administered 2019-02-02: 500 mg via INTRAVENOUS

## 2019-02-02 MED ORDER — ONDANSETRON HCL 4 MG/2ML IJ SOLN: 4.0000 mg | Freq: Once | INTRAMUSCULAR | Status: DC | PRN

## 2019-02-02 MED ORDER — DIPHENHYDRAMINE HCL 50 MG/ML IJ SOLN: 12.5000 mg | Freq: Four times a day (QID) | INTRAMUSCULAR | Status: DC | PRN

## 2019-02-02 MED ORDER — ONDANSETRON HCL 4 MG/2ML IJ SOLN: 4.0000 mg | Freq: Four times a day (QID) | INTRAMUSCULAR | Status: DC | PRN

## 2019-02-02 MED ORDER — STERILE WATER FOR IRRIGATION IR SOLN
Status: DC | PRN
  Administered 2019-02-02: 1000 mL

## 2019-02-02 MED ORDER — DIPHENHYDRAMINE HCL 12.5 MG/5ML PO ELIX: 12.5000 mg | ORAL_SOLUTION | Freq: Four times a day (QID) | ORAL | Status: DC | PRN

## 2019-02-02 SURGICAL SUPPLY — 60 items
APPLIER CLIP 5 13 M/L LIGAMAX5 (MISCELLANEOUS) ×4
BIOPATCH RED 1 DISK 7.0 (GAUZE/BANDAGES/DRESSINGS) ×3 IMPLANT
BIOPATCH RED 1IN DISK 7.0MM (GAUZE/BANDAGES/DRESSINGS) ×1
BLADE CLIPPER SURG (BLADE) IMPLANT
CANISTER SUCT 3000ML PPV (MISCELLANEOUS) ×4 IMPLANT
CHLORAPREP W/TINT 26 (MISCELLANEOUS) ×4 IMPLANT
CLIP APPLIE 5 13 M/L LIGAMAX5 (MISCELLANEOUS) ×2 IMPLANT
CLIP VESOCCLUDE LG 6/CT (CLIP) ×8 IMPLANT
COVER MAYO STAND STRL (DRAPES) IMPLANT
COVER SURGICAL LIGHT HANDLE (MISCELLANEOUS) ×4 IMPLANT
COVER WAND RF STERILE (DRAPES) ×4 IMPLANT
DERMABOND ADVANCED (GAUZE/BANDAGES/DRESSINGS) ×2
DERMABOND ADVANCED .7 DNX12 (GAUZE/BANDAGES/DRESSINGS) ×2 IMPLANT
DRAPE C-ARM 42X120 X-RAY (DRAPES) IMPLANT
DRSG OPSITE POSTOP 4X8 (GAUZE/BANDAGES/DRESSINGS) ×4 IMPLANT
DRSG TEGADERM 2-3/8X2-3/4 SM (GAUZE/BANDAGES/DRESSINGS) ×4 IMPLANT
ELECT BLADE 4.0 EZ CLEAN MEGAD (MISCELLANEOUS) ×4
ELECT BLADE 6.5 EXT (BLADE) ×4 IMPLANT
ELECT REM PT RETURN 9FT ADLT (ELECTROSURGICAL) ×4
ELECTRODE BLDE 4.0 EZ CLN MEGD (MISCELLANEOUS) ×2 IMPLANT
ELECTRODE REM PT RTRN 9FT ADLT (ELECTROSURGICAL) ×2 IMPLANT
GAUZE SPONGE 2X2 8PLY STRL LF (GAUZE/BANDAGES/DRESSINGS) ×2 IMPLANT
GLOVE SURG SIGNA 7.5 PF LTX (GLOVE) IMPLANT
GLOVE SURG SS PI 6.5 STRL IVOR (GLOVE) ×4 IMPLANT
GLOVE SURG SS PI 7.5 STRL IVOR (GLOVE) ×4 IMPLANT
GOWN STRL REUS W/ TWL LRG LVL3 (GOWN DISPOSABLE) ×4 IMPLANT
GOWN STRL REUS W/ TWL XL LVL3 (GOWN DISPOSABLE) ×2 IMPLANT
GOWN STRL REUS W/TWL LRG LVL3 (GOWN DISPOSABLE) ×4
GOWN STRL REUS W/TWL XL LVL3 (GOWN DISPOSABLE) ×2
KIT BASIN OR (CUSTOM PROCEDURE TRAY) ×4 IMPLANT
KIT TURNOVER KIT B (KITS) ×4 IMPLANT
NS IRRIG 1000ML POUR BTL (IV SOLUTION) ×4 IMPLANT
PAD ARMBOARD 7.5X6 YLW CONV (MISCELLANEOUS) ×4 IMPLANT
POUCH SPECIMEN RETRIEVAL 10MM (ENDOMECHANICALS) IMPLANT
SCISSORS LAP 5X35 DISP (ENDOMECHANICALS) ×4 IMPLANT
SET CHOLANGIOGRAPH 5 50 .035 (SET/KITS/TRAYS/PACK) IMPLANT
SET IRRIG TUBING LAPAROSCOPIC (IRRIGATION / IRRIGATOR) ×4 IMPLANT
SET TUBE SMOKE EVAC HIGH FLOW (TUBING) ×4 IMPLANT
SLEEVE ENDOPATH XCEL 5M (ENDOMECHANICALS) ×8 IMPLANT
SLEEVE SUCTION 125 (MISCELLANEOUS) ×4 IMPLANT
SLEEVE SUCTION CATH 165 (SLEEVE) ×4 IMPLANT
SPECIMEN JAR SMALL (MISCELLANEOUS) ×4 IMPLANT
SPONGE GAUZE 2X2 STER 10/PKG (GAUZE/BANDAGES/DRESSINGS) ×2
SPONGE LAP 18X18 RF (DISPOSABLE) ×4 IMPLANT
STAPLER VISISTAT 35W (STAPLE) ×4 IMPLANT
SUT ETHILON 2 0 FS 18 (SUTURE) ×4 IMPLANT
SUT MNCRL AB 4-0 PS2 18 (SUTURE) ×4 IMPLANT
SUT PDS AB 1 CTX 36 (SUTURE) ×16 IMPLANT
SUT SILK 2 0 TIES 10X30 (SUTURE) ×4 IMPLANT
SUT SILK 2 0SH CR/8 30 (SUTURE) ×4 IMPLANT
SUT SILK 3 0 (SUTURE) ×2
SUT SILK 3-0 18XBRD TIE 12 (SUTURE) ×2 IMPLANT
TOWEL GREEN STERILE (TOWEL DISPOSABLE) IMPLANT
TOWEL GREEN STERILE FF (TOWEL DISPOSABLE) ×4 IMPLANT
TRAY LAPAROSCOPIC MC (CUSTOM PROCEDURE TRAY) ×4 IMPLANT
TROCAR BLADELESS 11MM (ENDOMECHANICALS) IMPLANT
TROCAR XCEL BLUNT TIP 100MML (ENDOMECHANICALS) ×4 IMPLANT
TROCAR XCEL NON-BLD 11X100MML (ENDOMECHANICALS) ×4 IMPLANT
TROCAR XCEL NON-BLD 5MMX100MML (ENDOMECHANICALS) ×4 IMPLANT
WATER STERILE IRR 1000ML POUR (IV SOLUTION) ×4 IMPLANT

## 2019-02-02 NOTE — Progress Notes (Signed)
Patient had 275 cc of bloody drainage from JP drain since about 1300. Surgery MD on call paged. Received order for CBC this evening. Will continue to monitor.

## 2019-02-02 NOTE — Progress Notes (Signed)
Patient ID: Phillip Maldonado, male   DOB: 1951-02-09, 68 y.o.   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 fib, chronic abdominal pain, recent treatment for upper GI bleeding presented with acute abdominal pain.  He was admitted for gallstone pancreatitis.  He was noted to have septic shock on 01/21/2019 and was transferred to ICU.  He was eventually weaned off of pressors and transferred back to [facility] service.  MRCP showed acute pancreatitis, cholelithiasis, no CBD obstruction.  General surgery was consulted.  He was also found to have worsening creatinine.  Nephrology was consulted.  He needed temporary hemodialysis.  Assessment & Plan:   Sepsis/septic shock: Present on admission Acute cholecystitis Acute gallstone pancreatitis -Initially required ICU care and vasopressors.  After blood pressure improved, he was transferred to the floor under [facility] care. -MRCP showed mild peripancreatic fluid along pancreatic head/uncinate process related to acute pancreatitis.  No fluid collection or pseudocyst.  Cholelithiasis -HIDA scan was ordered: Patient refused -GI was consulted: Recommended outpatient follow-up with Dr. Carlean Purl in 2 to 4 weeks -Was on Rocephin and Flagyl: Currently on Unasyn -Blood cultures on 01/21/2019: Anaerobic bottle only with gram-negative rods: Bacteroides vulgatus -General surgery following: plan for cholecystectomy today  Acute kidney injury on chronic kidney disease stage III -developed AKI in the setting of septic shock.  Creatinine peaked to 8.26 -Renal ultrasound showed no obstruction.  Urine culture showed no growth.   -Nephrology following : Status post HD x2, last HD was on 01/27/2019.  Currently HD on hold.  Getting intravenous Lasix  intermittently.  Follow urine output. -Creatinine 4.74 today.  Acute respiratory failure with hypoxia -Due to combination of COPD/heart failure/OSA -Not on oxygen at home -Currently on room air  Essential hypertension -Blood pressure currently stable.  Continue amlodipine and metoprolol  Hyperlipidemia -Statin on hold due to elevated LFTs  Chronic diastolic CHF -Echo showed EF of 60 to 65% with grade 1 diastolic dysfunction -Nephrology following and managing volume.  Strict input and output and daily weights.  Diabetes mellitus type 2 uncontrolled with hyperglycemia -Continue Lantus.  Continue CBGs with SSI.  Left knee pain --Patient was supposed to have some type of procedure few months ago however this was delayed as his wife had Covid. -Orthopedics consulted and appreciated, status post left knee aspiration and injection (under 90 mL of clear fluid was obtained, 6 mL of 0.5% Marcaine and 80 mg of Depo-Medrol injected)  GERD/history of GI bleed -Continue PPI  History of paroxysmal A. Fib -Currently rate controlled and in sinus rhythm.  Not on anticoagulation as an outpatient.  Continue metoprolol.  Morbid obesity  -outpatient follow-up  Generalized deconditioning -will need PT eval once surgery is done   DVT prophylaxis: Heparin Code Status: Full Family Communication: Spoke to patient at bedside Disposition Plan: Depends on clinical outcome  Consultants: PCCM/general surgery/nephrology/gastroenterology's orthopedic surgery  Procedures:  Echo IMPRESSIONS    1. Left ventricular ejection fraction, by visual estimation, is 60 to 65%. The left ventricle has normal function. There is no left ventricular hypertrophy.  2. Left ventricular diastolic parameters are consistent with Grade I diastolic dysfunction (impaired relaxation).  3. Global right ventricle has normal systolic function.The right ventricular size is normal. No increase in right ventricular wall  thickness.  4. Left atrial  size was mildly dilated.  5. Right atrial size was normal.  6. Moderate mitral annular calcification.  7. The mitral valve is normal in structure. No evidence of mitral valve regurgitation. No evidence of mitral stenosis.  8. The tricuspid valve is normal in structure. Tricuspid valve regurgitation is not demonstrated.  9. The aortic valve is tricuspid. Aortic valve regurgitation is not visualized. Mild aortic valve sclerosis without stenosis. 10. The pulmonic valve was normal in structure. Pulmonic valve regurgitation is not visualized. 11. The inferior vena cava is normal in size with greater than 50% respiratory variability, suggesting right atrial pressure of 3 mmHg. 12. The interatrial septum was not well visualized.  Left knee aspiration and injection  Insertion of right IJ nontunneled HD cath   Antimicrobials:  Anti-infectives (From admission, onward)   Start     Dose/Rate Route Frequency Ordered Stop   01/31/19 2200  Ampicillin-Sulbactam (UNASYN) 3 g in sodium chloride 0.9 % 100 mL IVPB     3 g 200 mL/hr over 30 Minutes Intravenous Every 12 hours 01/31/19 1051     01/31/19 1000  Ampicillin-Sulbactam (UNASYN) 3 g in sodium chloride 0.9 % 100 mL IVPB  Status:  Discontinued     3 g 200 mL/hr over 30 Minutes Intravenous Every 24 hours 01/31/19 0859 01/31/19 1051   01/23/19 1400  piperacillin-tazobactam (ZOSYN) IVPB 2.25 g  Status:  Discontinued     2.25 g 100 mL/hr over 30 Minutes Intravenous Every 8 hours 01/23/19 0717 01/23/19 0947   01/23/19 1400  cefTRIAXone (ROCEPHIN) 2 g in sodium chloride 0.9 % 100 mL IVPB  Status:  Discontinued     2 g 200 mL/hr over 30 Minutes Intravenous Every 24 hours 01/23/19 0947 01/31/19 0857   01/23/19 1400  metroNIDAZOLE (FLAGYL) IVPB 500 mg  Status:  Discontinued     500 mg 100 mL/hr over 60 Minutes Intravenous Every 8 hours 01/23/19 0947 01/31/19 0857   01/21/19 1400  piperacillin-tazobactam (ZOSYN) IVPB 3.375 g   Status:  Discontinued     3.375 g 12.5 mL/hr over 240 Minutes Intravenous Every 8 hours 01/21/19 1325 01/23/19 0717      Subjective: Patient seen and examined at bedside.  Poor historian.  Still has intermittent abdominal pain.  No overnight fever, vomiting or worsening shortness of breath reported.  Objective: Vitals:   02/01/19 1640 02/01/19 2155 02/02/19 0327 02/02/19 0549  BP: 126/68 131/79  (!) 151/93  Pulse: 73 67  62  Resp: 19 16  16   Temp: 98.3 F (36.8 C) 98.5 F (36.9 C)  (!) 97.4 F (36.3 C)  TempSrc: Oral Oral  Oral  SpO2: 94% 96%  94%  Weight:   (!) 156.8 kg   Height:        Intake/Output Summary (Last 24 hours) at 02/02/2019 0809 Last data filed at 02/02/2019 [id number] Gross per 24 hour  Intake 542 ml  Output 2700 ml  Net -2158 ml   Filed Weights   02/01/19 1234 02/01/19 1611 02/02/19 0327  Weight: (!) 157.8 kg (!) 155.9 kg (!) 156.8 kg    Examination:  General exam: No acute distress.  Looks chronically ill Respiratory system: Bilateral decreased breath sounds mostly at the bases.  Some scattered crackles  cardiovascular system: Rate controlled, S1-S2 heard Gastrointestinal system: Abdomen is nondistended, soft and mild right upper quadrant tenderness present. Normal bowel sounds heard. Extremities: No cyanosis; trace bilateral lower extremity edema.  Right upper extremity edema present  Data Reviewed: I have personally reviewed  following labs and imaging studies  CBC: Recent Labs  Lab 01/27/19 0429 01/30/19 0306 02/01/19 0450 02/02/19 0444  WBC 11.2*  --  18.4* 18.9*  NEUTROABS  --   --   --  14.8*  HGB 11.2* 12.5* 13.2 12.8*  HCT 35.0* 39.8 41.7 40.8  MCV 88.8  --  90.5 89.5  PLT 271  --  409* 0000000   Basic Metabolic Panel: Recent Labs  Lab 01/28/19 0428 01/29/19 0329 01/30/19 0306 01/31/19 0350 02/01/19 0450 02/02/19 0444  NA 135 136 136 137 139 139  K 4.3 4.5 4.1 4.0 3.8 3.6  CL 98 100 99 101 101 102  CO2 21* 21* 19* 19* 19* 23   GLUCOSE 188* 226* 211* 215* 164* 158*  BUN 57* 76* 93* 104* 110* 64*  CREATININE 6.74* 7.33* 7.57* 7.30* 7.01* 4.74*  CALCIUM 8.4* 8.5* 8.5* 8.5* 8.6* 8.3*  MG  --   --   --   --   --  2.0  PHOS 6.1* 7.9* 8.8* 9.5* 9.6*  --    GFR: Estimated Creatinine Clearance: 22.5 mL/min (A) (by C-G formula based on SCr of 4.74 mg/dL (H)). Liver Function Tests: Recent Labs  Lab 01/27/19 0429  01/29/19 0329 01/30/19 0306 01/31/19 0350 02/01/19 0450 02/02/19 0444  AST 20  --   --   --   --   --  14*  ALT 32  --   --   --   --   --  12  ALKPHOS 115  --   --   --   --   --  81  BILITOT 1.2  --   --   --   --   --  0.7  PROT 6.8  --   --   --   --   --  6.6  ALBUMIN 2.0*   < > 2.0* 2.0* 2.1* 2.3* 2.4*   < > = values in this interval not displayed.   No results for input(s): LIPASE, AMYLASE in the last 168 hours. No results for input(s): AMMONIA in the last 168 hours. Coagulation Profile: No results for input(s): INR, PROTIME in the last 168 hours. Cardiac Enzymes: No results for input(s): CKTOTAL, CKMB, CKMBINDEX, TROPONINI in the last 168 hours. BNP (last 3 results) No results for input(s): PROBNP in the last 8760 hours. HbA1C: No results for input(s): HGBA1C in the last 72 hours. CBG: Recent Labs  Lab 02/01/19 0803 02/01/19 1144 02/01/19 1755 02/01/19 2309 02/02/19 0745  GLUCAP 173* 155* 181* 137* 151*   Lipid Profile: No results for input(s): CHOL, HDL, LDLCALC, TRIG, CHOLHDL, LDLDIRECT in the last 72 hours. Thyroid Function Tests: No results for input(s): TSH, T4TOTAL, FREET4, T3FREE, THYROIDAB in the last 72 hours. Anemia Panel: No results for input(s): VITAMINB12, FOLATE, FERRITIN, TIBC, IRON, RETICCTPCT in the last 72 hours. Sepsis Labs: No results for input(s): PROCALCITON, LATICACIDVEN in the last 168 hours.  Recent Results (from the past 240 hour(s))  Culture, Urine     Status: None   Collection Time: 01/23/19  2:02 PM   Specimen: Urine, Random  Result Value Ref  Range Status   Specimen Description   Final    URINE, RANDOM Performed at [facility] [address], [place], [place] [address]    Special Requests   Final    NONE Performed at [facility], [place] [address], [place], [place] [address]    Culture   Final    NO  GROWTH Performed at [facility], [place] [address] [place], [place] [address]    Report Status 01/24/2019 FINAL  Final  Body fluid culture     Status: None   Collection Time: 01/26/19 12:43 PM   Specimen: Body Fluid  Result Value Ref Range Status   Specimen Description FLUID  Final   Special Requests SYNOVIAL KNEE  Final   Gram Stain   Final    WBC PRESENT,BOTH PMN AND MONONUCLEAR NO ORGANISMS SEEN CYTOSPIN SMEAR    Culture   Final    NO GROWTH 3 DAYS Performed at [facility], [address], [place], [place] [address]    Report Status 01/29/2019 FINAL  Final         Radiology Studies: No results found.      Scheduled Meds: . amLODipine  5 mg Oral Daily  . Chlorhexidine Gluconate Cloth  6 each Topical Daily  . DULoxetine  60 mg Oral Daily  . heparin injection (subcutaneous)  5,000 Units Subcutaneous Q8H  . insulin aspart  0-5 Units Subcutaneous QHS  . insulin aspart  0-9 Units Subcutaneous TID WC  . insulin glargine  20 Units Subcutaneous Q2200  . mouth rinse  15 mL Mouth Rinse BID  . metoprolol succinate  100 mg Oral Daily  . pantoprazole  40 mg Oral Daily  . predniSONE  20 mg Oral Q breakfast  . simethicone  80 mg Oral QID  . sodium chloride flush  10-40 mL Intracatheter Q12H  . thiamine  100 mg Oral Daily  . traZODone  300 mg Oral QHS   Continuous Infusions: . ampicillin-sulbactam (UNASYN) IV 3 g (02/02/19 0135)          Aline August, MD [facility] 02/02/2019, 8:09 AM

## 2019-02-02 NOTE — Anesthesia Procedure Notes (Signed)
Procedure Name: Intubation Date/Time: 02/02/2019 9:04 AM Performed by: Orlie Dakin, CRNA Pre-anesthesia Checklist: Patient identified, Emergency Drugs available, Suction available and Patient being monitored Patient Re-evaluated:Patient Re-evaluated prior to induction Oxygen Delivery Method: Circle system utilized Preoxygenation: Pre-oxygenation with 100% oxygen Induction Type: IV induction Ventilation: Mask ventilation without difficulty and Oral airway inserted - appropriate to patient size Laryngoscope Size: Sabra Heck and 3 Grade View: Grade I Tube type: Oral Tube size: 7.5 mm Number of attempts: 1 Airway Equipment and Method: Stylet Placement Confirmation: ETT inserted through vocal cords under direct vision,  positive ETCO2 and breath sounds checked- equal and bilateral Secured at: 23 cm Tube secured with: Tape Dental Injury: Teeth and Oropharynx as per pre-operative assessment  Comments: Very very poor dentition, upper and lower front teeth, many missing, broken off, sharp edges.  Noted scant amount of blood on tongue with DL,? Due to oral airway for mask vent.

## 2019-02-02 NOTE — Progress Notes (Signed)
Patient ID: Phillip Maldonado, male   DOB: 1950/10/02, 68 y.o.   MRN: [id number]   Pre Procedure note for inpatients:   Phillip Maldonado has been scheduled for Procedure(s): LAPAROSCOPIC CHOLECYSTECTOMY WITH INTRAOPERATIVE CHOLANGIOGRAM (N/A) today. The various methods of treatment have been discussed with the patient. After consideration of the risks, benefits and treatment options the patient has consented to the planned procedure.   The patient has been seen and labs reviewed. There are no changes in the patient's condition to prevent proceeding with the planned procedure today.  Recent labs:  Lab Results  Component Value Date   WBC 18.9 (H) 02/02/2019   HGB 12.8 (L) 02/02/2019   HCT 40.8 02/02/2019   PLT 353 02/02/2019   GLUCOSE 158 (H) 02/02/2019   CHOL 111 01/23/2019   TRIG 136 01/23/2019   HDL 29 (L) 01/23/2019   LDLCALC 55 01/23/2019   ALT 12 02/02/2019   AST 14 (L) 02/02/2019   NA 139 02/02/2019   K 3.6 02/02/2019   CL 102 02/02/2019   CREATININE 4.74 (H) 02/02/2019   BUN 64 (H) 02/02/2019   CO2 23 02/02/2019   TSH 2.396 01/21/2019   PSA 0.4 12/30/2015   INR 1.4 (H) 01/21/2019   HGBA1C 8.6 (H) 01/21/2019    Coralie Keens, MD 02/02/2019 7:55 AM

## 2019-02-02 NOTE — Anesthesia Postprocedure Evaluation (Signed)
Anesthesia Post Note  Patient: Phillip Maldonado  Procedure(s) Performed: attempted LAPAROSCOPIC CHOLECYSTECTOMY (N/A Abdomen) Cholecystectomy (Abdomen)     Patient location during evaluation: PACU Anesthesia Type: General Level of consciousness: awake and alert Pain management: pain level controlled Vital Signs Assessment: post-procedure vital signs reviewed and stable Respiratory status: spontaneous breathing, nonlabored ventilation, respiratory function stable and patient connected to nasal cannula oxygen Cardiovascular status: blood pressure returned to baseline and stable Postop Assessment: no apparent nausea or vomiting Anesthetic complications: no    Last Vitals:  Vitals:   02/02/19 1158 02/02/19 1200  BP: (!) 113/50   Pulse: 68   Resp: (!) 21   Temp:  36.6 C  SpO2: 94%                    Audry Pili

## 2019-02-02 NOTE — Transfer of Care (Signed)
Immediate Anesthesia Transfer of Care Note  Patient: Phillip Maldonado  Procedure(s) Performed: attempted LAPAROSCOPIC CHOLECYSTECTOMY (N/A Abdomen) Cholecystectomy (Abdomen)  Patient Location: PACU  Anesthesia Type:General  Level of Consciousness: awake and patient cooperative  Airway & Oxygen Therapy: Patient Spontanous Breathing and Patient connected to face mask oxygen  Post-op Assessment: Report given to RN and Post -op Vital signs reviewed and stable  Post vital signs: Reviewed and stable  Last Vitals:  Vitals Value Taken Time  BP    Temp    Pulse    Resp    SpO2      Last Pain:  Vitals:   02/02/19 0549  TempSrc: Oral  PainSc:       Patients Stated Pain Goal: 4 ([id number])  Complications: No apparent anesthesia complications

## 2019-02-02 NOTE — Progress Notes (Signed)
[facility] ROUNDING NOTE   Subjective:   This is a 63 gentleman history of gastric bypass surgery, hypertension diabetes mellitus type 2 proximal atrial fibrillation chronic abdominal pain recent admission for upper GI bleed with acute abdominal pain.  Admitted with gallstone pancreatitis and septic shock 01/21/2019 transferred to the ICU.  Eventually weaned off pressors.  02/02/2019 underwent laparoscopic converted to open cholecystectomy.  Appreciate assistance of Dr. Mercy Riding  Creatinine has been slow to improve and underwent hemodialysis 02/01/2019 through nontunneled HD catheter right.  Ultrafiltration 1 L.  Urine output 1.3 L weight down to 156.8 kg  Sodium 139 potassium 3.6 chloride 102 CO2 23 BUN 64 creatinine 4.74 glucose 158 albumin 2.4 liver enzymes within normal range WBC 18.9 hemoglobin 12.8 platelets 353  Norvasc 5 mg daily, Cymbalta 60 mg daily, Dilaudid IV every 4 hours, insulin sliding scale, glargine 20 units daily, metoprolol 100 mg daily, Protonix 40 mg daily, prednisone 20 mg daily, thiamine 100 mg daily, trazodone 300 mg nightly  Unasyn 3 g every 12 hours IV   Objective:  Vital signs in last 24 hours:  Temp:  [97.4 F (36.3 C)-98.5 F (36.9 C)] 97.9 F (36.6 C) (11/19 1200) Pulse Rate:  [62-78] 68 (11/19 1158) Resp:  [16-22] 21 (11/19 1158) BP: (106-151)/(46-93) 113/50 (11/19 1158) SpO2:  [88 %-96 %] 94 % (11/19 1158) Weight:  [155.9 kg-156.8 kg] 156.8 kg (11/19 0327)  Weight change: -1 kg Filed Weights   02/01/19 1234 02/01/19 1611 02/02/19 0327  Weight: (!) 157.8 kg (!) 155.9 kg (!) 156.8 kg    Intake/Output: I/O last 3 completed shifts: In: [id number] [P.O.:542; IV Piggyback:100] Out: [id number] [Urine:2550; Other:1000]   Intake/Output this shift:  Total I/O In: 950 [I.V.:700; IV Piggyback:250] Out: 1560 [Urine:1075; Drains:85; Blood:400]  Gen: appears stable nondistressed CVS: RRR no m/r/g Resp: clear bilaterally no c/w/r Abd: soft,   hypoactive BS drains abdominal appropriately tender Ext: 1+ anasarca, improved Dialysis Access: R IJ nontunneled HD cath   Basic Metabolic Panel: Recent Labs  Lab 01/28/19 0428 01/29/19 0329 01/30/19 0306 01/31/19 0350 02/01/19 0450 02/02/19 0444  NA 135 136 136 137 139 139  K 4.3 4.5 4.1 4.0 3.8 3.6  CL 98 100 99 101 101 102  CO2 21* 21* 19* 19* 19* 23  GLUCOSE 188* 226* 211* 215* 164* 158*  BUN 57* 76* 93* 104* 110* 64*  CREATININE 6.74* 7.33* 7.57* 7.30* 7.01* 4.74*  CALCIUM 8.4* 8.5* 8.5* 8.5* 8.6* 8.3*  MG  --   --   --   --   --  2.0  PHOS 6.1* 7.9* 8.8* 9.5* 9.6*  --     Liver Function Tests: Recent Labs  Lab 01/27/19 0429  01/29/19 0329 01/30/19 0306 01/31/19 0350 02/01/19 0450 02/02/19 0444  AST 20  --   --   --   --   --  14*  ALT 32  --   --   --   --   --  12  ALKPHOS 115  --   --   --   --   --  81  BILITOT 1.2  --   --   --   --   --  0.7  PROT 6.8  --   --   --   --   --  6.6  ALBUMIN 2.0*   < > 2.0* 2.0* 2.1* 2.3* 2.4*   < > = values in this interval not displayed.   No results for input(s):  LIPASE, AMYLASE in the last 168 hours. No results for input(s): AMMONIA in the last 168 hours.  CBC: Recent Labs  Lab 01/27/19 0429 01/30/19 0306 02/01/19 0450 02/02/19 0444  WBC 11.2*  --  18.4* 18.9*  NEUTROABS  --   --   --  14.8*  HGB 11.2* 12.5* 13.2 12.8*  HCT 35.0* 39.8 41.7 40.8  MCV 88.8  --  90.5 89.5  PLT 271  --  409* 353    Cardiac Enzymes: No results for input(s): CKTOTAL, CKMB, CKMBINDEX, TROPONINI in the last 168 hours.  BNP: Invalid input(s): POCBNP  CBG: Recent Labs  Lab 02/01/19 1755 02/01/19 2309 02/02/19 0745 02/02/19 1113 02/02/19 1306  GLUCAP 181* 137* 151* 181* 274*    Microbiology: Results for orders placed or performed during the hospital encounter of 01/20/19  SARS CORONAVIRUS 2 (TAT 6-24 HRS) Nasopharyngeal Nasopharyngeal Swab     Status: None   Collection Time: 01/20/19  6:20 PM   Specimen:  Nasopharyngeal Swab  Result Value Ref Range Status   SARS Coronavirus 2 NEGATIVE NEGATIVE Final    Comment: (NOTE) SARS-CoV-2 target nucleic acids are NOT DETECTED. The SARS-CoV-2 RNA is generally detectable in upper and lower respiratory specimens during the acute phase of infection. Negative results do not preclude SARS-CoV-2 infection, do not rule out co-infections with other pathogens, and should not be used as the sole basis for treatment or other patient management decisions. Negative results must be combined with clinical observations, patient history, and epidemiological information. The expected result is Negative. Fact Sheet for Patients: SugarRoll.be Fact Sheet for Healthcare Providers: https://www.woods-mathews.com/ This test is not yet approved or cleared by the [place] FDA and  has been authorized for detection and/or diagnosis of SARS-CoV-2 by FDA under an Emergency Use Authorization (EUA). This EUA will remain  in effect (meaning this test can be used) for the duration of the COVID-19 declaration under Section 56 4(b)(1) of the Act, 21 U.S.C. section 360bbb-3(b)(1), unless the authorization is terminated or revoked sooner. Performed at [facility], [place] [address] [place], [place] [address]   Culture, Urine     Status: Abnormal   Collection Time: 01/21/19  1:03 PM   Specimen: Urine, Random  Result Value Ref Range Status   Specimen Description   Final    URINE, RANDOM Performed at [place] [address] [place], [place] [address]    Special Requests   Final    NONE Performed at [facility], [place] [address], [place], [place] [address]    Culture (A)  Final    40,000 COLONIES/mL MULTIPLE SPECIES PRESENT, SUGGEST RECOLLECTION   Report Status 01/23/2019 FINAL  Final  Culture, blood (routine x 2)     Status: None   Collection Time: 01/21/19  1:32 PM   Specimen:  BLOOD  Result Value Ref Range Status   Specimen Description   Final    BLOOD RIGHT ANTECUBITAL Performed at [facility], [place] [address], [place], [place] [address]    Special Requests   Final    BOTTLES DRAWN AEROBIC AND ANAEROBIC Blood Culture adequate volume Performed at [place] [address] [place], [place] [address]    Culture   Final    NO GROWTH 5 DAYS Performed at [facility], [place] [address], [place] [place] [employer], [place] [address]    Report Status 01/26/2019 FINAL  Final  Culture, blood (routine x 2)     Status:  Abnormal   Collection Time: 01/21/19  1:33 PM   Specimen: BLOOD  Result Value Ref Range Status   Specimen Description   Final    BLOOD RIGHT HAND Performed at [place] [address] [place], [place] [address]    Special Requests   Final    BOTTLES DRAWN AEROBIC AND ANAEROBIC Blood Culture adequate volume Performed at [place] [address] [place], [place] [address]    Culture  Setup Time   Final    ANAEROBIC BOTTLE ONLY GRAM NEGATIVE RODS CRITICAL RESULT CALLED TO, READ BACK BY AND VERIFIED WITH: Irwin Brakeman [id number] 01/24/2019 T. TYSOR    Culture (A)  Final    BACTEROIDES VULGATUS BETA LACTAMASE POSITIVE Performed at [facility], [place] [address], [place],  [place] [address]    Report Status 01/26/2019 FINAL  Final  Blood Culture ID Panel (Reflexed)     Status: None   Collection Time: 01/21/19  1:33 PM  Result Value Ref Range Status   Enterococcus species NOT DETECTED NOT DETECTED Final   Listeria monocytogenes NOT DETECTED NOT DETECTED Final   Staphylococcus species NOT DETECTED NOT DETECTED Final   Staphylococcus aureus (BCID) NOT DETECTED NOT DETECTED Final   Streptococcus species NOT DETECTED NOT DETECTED Final   Streptococcus agalactiae NOT DETECTED NOT DETECTED Final   Streptococcus pneumoniae NOT DETECTED NOT DETECTED Final   Streptococcus pyogenes NOT  DETECTED NOT DETECTED Final   Acinetobacter baumannii NOT DETECTED NOT DETECTED Final   Enterobacteriaceae species NOT DETECTED NOT DETECTED Final   Enterobacter cloacae complex NOT DETECTED NOT DETECTED Final   Escherichia coli NOT DETECTED NOT DETECTED Final   Klebsiella oxytoca NOT DETECTED NOT DETECTED Final   Klebsiella pneumoniae NOT DETECTED NOT DETECTED Final   Proteus species NOT DETECTED NOT DETECTED Final   Serratia marcescens NOT DETECTED NOT DETECTED Final   Haemophilus influenzae NOT DETECTED NOT DETECTED Final   Neisseria meningitidis NOT DETECTED NOT DETECTED Final   Pseudomonas aeruginosa NOT DETECTED NOT DETECTED Final   Candida albicans NOT DETECTED NOT DETECTED Final   Candida glabrata NOT DETECTED NOT DETECTED Final   Candida krusei NOT DETECTED NOT DETECTED Final   Candida parapsilosis NOT DETECTED NOT DETECTED Final   Candida tropicalis NOT DETECTED NOT DETECTED Final    Comment: Performed at [facility], [address] [place], [place] [address]  MRSA PCR Screening     Status: None   Collection Time: 01/21/19  7:28 PM   Specimen: Nasal Mucosa; Nasopharyngeal  Result Value Ref Range Status   MRSA by PCR NEGATIVE NEGATIVE Final    Comment:        The GeneXpert MRSA Assay (FDA approved for NASAL specimens only), is one component of a comprehensive MRSA colonization surveillance program. It is not intended to diagnose MRSA infection nor to guide or monitor treatment for MRSA infections. Performed at [facility], [place] [address] [place], [place] [address]   Culture, Urine     Status: None   Collection Time: 01/23/19  2:02 PM   Specimen: Urine, Random  Result Value Ref Range Status   Specimen Description   Final    URINE, RANDOM Performed at [place] [address], [place], [place] [address]    Special Requests   Final    NONE Performed at [facility], [place] [address]  [place], [place] [address]    Culture   Final  NO GROWTH Performed at [facility], [place] [address], [place], [place] [address]    Report Status 01/24/2019 FINAL  Final  Body fluid culture     Status: None   Collection Time: 01/26/19 12:43 PM   Specimen: Body Fluid  Result Value Ref Range Status   Specimen Description FLUID  Final   Special Requests SYNOVIAL KNEE  Final   Gram Stain   Final    WBC PRESENT,BOTH PMN AND MONONUCLEAR NO ORGANISMS SEEN CYTOSPIN SMEAR    Culture   Final    NO GROWTH 3 DAYS Performed at [facility], [address], [place], [place] [address]    Report Status 01/29/2019 FINAL  Final    Coagulation Studies: No results for input(s): LABPROT, INR in the last 72 hours.  Urinalysis: No results for input(s): COLORURINE, LABSPEC, PHURINE, GLUCOSEU, HGBUR, BILIRUBINUR, KETONESUR, PROTEINUR, UROBILINOGEN, NITRITE, LEUKOCYTESUR in the last 72 hours.  Invalid input(s): APPERANCEUR    Imaging: No results found.   Medications:   . sodium chloride 50 mL/hr at 02/02/19 1308  . ampicillin-sulbactam (UNASYN) IV 3 g (02/02/19 0135)   . amLODipine  5 mg Oral Daily  . Chlorhexidine Gluconate Cloth  6 each Topical Daily  . DULoxetine  60 mg Oral Daily  . [START ON 02/03/2019] heparin injection (subcutaneous)  5,000 Units Subcutaneous Q8H  . HYDROmorphone   Intravenous Q4H  . HYDROmorphone      . insulin aspart  0-5 Units Subcutaneous QHS  . insulin aspart  0-9 Units Subcutaneous TID WC  . insulin glargine  20 Units Subcutaneous Q2200  . mouth rinse  15 mL Mouth Rinse BID  . metoprolol succinate  100 mg Oral Daily  . pantoprazole  40 mg Oral Daily  . predniSONE  20 mg Oral Q breakfast  . simethicone  80 mg Oral QID  . sodium chloride flush  10-40 mL Intracatheter Q12H  . thiamine  100 mg Oral Daily  . traZODone  300 mg Oral QHS   bisacodyl, diphenhydrAMINE **OR** diphenhydrAMINE, hydrOXYzine, levalbuterol, naloxone **AND** sodium chloride  flush, ondansetron **OR** ondansetron (ZOFRAN) IV, ondansetron (ZOFRAN) IV, oxyCODONE-acetaminophen, polyethylene glycol, sodium chloride flush  Assessment/ Plan:   Acute kidney injury combination of IV contrast hypotension shock and use of ACE inhibitors.  Status post hemodialysis x2 Friday, 01/27/2019 and Wednesday, 02/01/2019.  Making urine continue to follow for recovery.  Continue to avoid nephrotoxins.     Septic shock gallstone pancreatitis status post open cholecystectomy.  Hypertension/volume.  Received Lasix 80 mg 01/30/2019 01/31/2019 02/01/2019.  Continue to follow for recovery appears to be nonoliguric.  Did receive 1 L removed with dialysis treatment 02/01/2019.  Sepsis blood cultures + 01/21/2019 anaerobic [place] for gram-negative rods Bacteroides vulgaris currently on Unasyn 3 g every 12 hours.  May need to decrease dose due to renal insufficiency.  Acute respiratory failure with hypoxia currently on room air history of COPD.  He is now on oral steroids  Left knee pain status post aspiration with Depo-Medrol injection.  Diabetes mellitus as per primary service  History of paroxysmal atrial fibrillation now in sinus rhythm not on anticoagulation as an outpatient continues on beta-blockers  Continues on oral thiamine prophylaxis  Depression stable with antidepressants  Gastroesophageal reflux disease on PPI therapy   LOS: [place] @TODAY @1 :10 PM

## 2019-02-02 NOTE — Op Note (Signed)
Phillip Maldonado 02/02/2019   Pre-op Diagnosis: gallstones pancreatitis     Post-op Diagnosis: same  Procedure(s): LAPAROSCOPIC CONVERTED TO OPEN CHOLECYSTECTOMY  Surgeon(s): Coralie Keens, MD Georganna Skeans, MD  Anesthesia: General  Staff:  Circulator: Cyd Silence, RN; Riojas, Marita [place], RN Physician Assistant: Vivia Ewing, PA-C Scrub Person: Irven Easterly, Vida Roller, Jahmesha S  Estimated Blood Loss: 400 cc               Specimens: sent to path  Procedure: The patient was brought to the operating room identifies correct patient.  He was placed upon the operating table general anesthesia was induced.  His abdomen was then prepped and draped in usual sterile fashion.  I made a small vertical incision above the umbilicus with a scalpel.  I carried this down to the fascia which was then opened with the scalpel.  Hemostat was then used to pass to the peritoneal cavity under direct vision.  A 0 Vicryl pursing suture was placed around the fascial opening.  The [facility] port was placed the opening and insufflation of the abdomen was begun.  The patient had extensive adhesions of omentum and colon to the abdominal wall from his prior surgeries.  I placed a 5 mm trocar under direct vision in the left midabdomen.  I then undertook extensive lysis adhesions freeing up the omentum and colon from the abdominal wall.  He is morbidly obese with a large abdomen.  I placed another 5 mm trocar closer to the midline and more superior to this one under direct vision.  I then continued the lysis of adhesions.  I can finally free up all the adhesions and identified the liver.  There were extensive adhesions to the liver itself.  I was able to place a trocar in the epigastrium and another in the right upper quadrant under direct vision.  I then placed a 11 mm trocar in the patient's right mid abdomen as well.  We moved the camera to this port.  At this point, the  gallbladder can still not be visualized given the extensive adhesions including the right colon at the hepatic flexure to the undersurface of the liver.  At this point, the decision made to convert to an open procedure.  I created a right upper quadrant subcostal incision with a scalpel.  I carried this down through the fascia and muscle layers electrocautery and then open the peritoneum the entire length of the incision.  All trochars were removed.  I closed the 0 Vicryl the umbilicus taken in place closing the fascial defect there.  The Bookwalter retractor was then used to retract around the incision.  I could then identify the gallbladder which surprisingly was the same color the liver.  It was acutely distended and quite large.  We entered the gallbladder as we were trying to take a dome down from the liver.  I was then able to finger fractured the gallbladder posteriorly and bring it up off the liver bed.  We then dissected down toward the base the gallbladder.  Given his morbid obesity and fatty liver, this was quite difficult.  I finally appeared to achieve a critical window around what appeared to be the cystic duct.  I tied this off proximally with 2 separate 2-0 silk sutures and then cut above this.  We then identified what appeared to be a cystic artery and clipped with surgical clips and then completely transected the remaining gallbladder.  The gallbladder was  then sent to pathology for evaluation.  We then copiously irrigated the right upper quadrant with normal saline.  I placed several pieces of surgical snow in the gallbladder fossa and hemostasis appeared to be achieved.  I could not visualize any evidence of bile leak.  We then placed a 19 [place] Blake drain through a separate right upper quadrant incision under direct vision and placed this into the area of the gallbladder fossa.  This was sutured in place with nylon suture.  At this point all retractors were removed.  Hemostasis appeared to be  achieved.  I then closed the posterior fascia with a running #1 ED a suture and then closed the anterior fascia with a running #1 PDS suture as well.  The skin was then irrigated closed with skin staples.  All other incisions were irrigated and closed with staples well.  The patient appeared to tolerate the procedure fairly well.  All sponge, needle, and instrument counts were correct at the end of the procedure.  The patient was then extubated in the operating room and taken in stable addition to the recovery room.          Coralie Keens   Date: 02/02/2019  Time: 10:53 AM

## 2019-02-03 ENCOUNTER — Encounter (HOSPITAL_COMMUNITY): Payer: Self-pay | Admitting: Surgery

## 2019-02-03 LAB — GLUCOSE, CAPILLARY
Glucose-Capillary: 170 mg/dL — ABNORMAL HIGH (ref 70–99)
Glucose-Capillary: 166 mg/dL — ABNORMAL HIGH (ref 70–99)
Glucose-Capillary: 212 mg/dL — ABNORMAL HIGH (ref 70–99)
Glucose-Capillary: 255 mg/dL — ABNORMAL HIGH (ref 70–99)
Glucose-Capillary: 129 mg/dL — ABNORMAL HIGH (ref 70–99)

## 2019-02-03 LAB — COMPREHENSIVE METABOLIC PANEL
ALT: 18 U/L (ref 0–44)
AST: 31 U/L (ref 15–41)
Albumin: 2.5 g/dL — ABNORMAL LOW (ref 3.5–5.0)
Alkaline Phosphatase: 69 U/L (ref 38–126)
Anion gap: 14 (ref 5–15)
BUN: 74 mg/dL — ABNORMAL HIGH (ref 8–23)
CO2: 22 mmol/L (ref 22–32)
Calcium: 8.6 mg/dL — ABNORMAL LOW (ref 8.9–10.3)
Chloride: 103 mmol/L (ref 98–111)
Creatinine, Ser: 4.69 mg/dL — ABNORMAL HIGH (ref 0.61–1.24)
GFR calc Af Amer: 14 mL/min — ABNORMAL LOW (ref >60)
GFR calc non Af Amer: 12 mL/min — ABNORMAL LOW (ref >60)
Glucose, Bld: 185 mg/dL — ABNORMAL HIGH (ref 70–99)
Potassium: 4 mmol/L (ref 3.5–5.1)
Sodium: 139 mmol/L (ref 135–145)
Total Bilirubin: 0.8 mg/dL (ref 0.3–1.2)
Total Protein: 6.5 g/dL (ref 6.5–8.1)

## 2019-02-03 LAB — CBC WITH DIFFERENTIAL/PLATELET
Abs Immature Granulocytes: 0.47 10*3/uL — ABNORMAL HIGH (ref 0.00–0.07)
Basophils Absolute: 0.1 10*3/uL (ref 0.0–0.1)
Basophils Relative: 0 %
Eosinophils Absolute: 0.2 10*3/uL (ref 0.0–0.5)
Eosinophils Relative: 1 %
HCT: 35.6 % — ABNORMAL LOW (ref 39.0–52.0)
Hemoglobin: 11.2 g/dL — ABNORMAL LOW (ref 13.0–17.0)
Immature Granulocytes: 2 %
Lymphocytes Relative: 8 %
Lymphs Abs: 1.6 10*3/uL (ref 0.7–4.0)
MCH: 28.7 pg (ref 26.0–34.0)
MCHC: 31.5 g/dL (ref 30.0–36.0)
MCV: 91.3 fL (ref 80.0–100.0)
Monocytes Absolute: 1 10*3/uL (ref 0.1–1.0)
Monocytes Relative: 5 %
Neutro Abs: 16.3 10*3/uL — ABNORMAL HIGH (ref 1.7–7.7)
Neutrophils Relative %: 84 %
Platelets: 324 10*3/uL (ref 150–400)
RBC: 3.9 MIL/uL — ABNORMAL LOW (ref 4.22–5.81)
RDW: 16.5 % — ABNORMAL HIGH (ref 11.5–15.5)
WBC: 19.6 10*3/uL — ABNORMAL HIGH (ref 4.0–10.5)
nRBC: 0 % (ref 0.0–0.2)

## 2019-02-03 MED ORDER — FUROSEMIDE 10 MG/ML IJ SOLN: 80.0000 mg | Freq: Once | INTRAMUSCULAR | Status: DC

## 2019-02-03 MED ORDER — PREDNISONE 10 MG PO TABS
10.0000 mg | ORAL_TABLET | Freq: Every day | ORAL | Status: DC
  Administered 2019-02-04 – 2019-02-05 (×2): 10 mg via ORAL
  Filled 2019-02-03 (×2): qty 1

## 2019-02-03 NOTE — Evaluation (Signed)
Physical Therapy Evaluation Patient Details Name: ABDIEL KATER MRN: [id number] DOB: 14-Jan-1951 Today's Date: 02/03/2019   History of Present Illness  68 year old male with history of gastric bypass in the 70s, repeated; hypertension, diabetes mellitus type 2, paroxysmal A. fib, chronic abdominal pain, recent treatment for upper GI bleeding presented with acute abdominal pain.  He was admitted for gallstone pancreatitis.  He was noted to have septic shock on 01/21/2019 and was transferred to ICU.  He was eventually weaned off of pressors and transferred back to [facility] service.  MRCP showed acute pancreatitis, cholelithiasis, no CBD obstruction.  General surgery was consulted.  He was also found to have worsening creatinine.  Nephrology was consulted.  He needed temporary hemodialysis.  He underwent cholecystectomy on 02/02/2019.    Clinical Impression  Pt was able to get OOB and walk 20 feet today.  O2 sats stayed >95% on room air.  Pt should progress nicely.  He may not need HH services but with his sore right shoulder and sore left knee - want to make sure he gets on a walking program at home to continue steady healing. Pt has no steps at home.  Pt and wife agreeable to plan.  Acute PT will continue to follow him while in the hospital.      Follow Up Recommendations Home health PT;Supervision for mobility/OOB    Equipment Recommendations  None recommended by PT    Recommendations for Other Services       Precautions / Restrictions Precautions Precaution Comments: abdominal incision, bad right shoulder, bad left knee, B carpal tunnel Restrictions Weight Bearing Restrictions: No      Mobility  Bed Mobility Overal bed mobility: Needs Assistance Bed Mobility: Supine to Sit     Supine to sit: HOB elevated;+2 for physical assistance;Mod assist     General bed mobility comments: HOB up - needed assist to lift trunk - esp since pain in right shoulder  Transfers Overall transfer  level: Needs assistance Equipment used: Rolling walker (2 wheeled) Transfers: Sit to/from Stand Sit to Stand: Min assist;From elevated surface;+2 physical assistance         General transfer comment: pt needed cues for hand placement.  he stood from high surface  will need more help from lower chair  Ambulation/Gait Ambulation/Gait assistance: Min assist;+2 safety/equipment Gait Distance (Feet): 20 Feet Assistive device: Rolling walker (2 wheeled) Gait Pattern/deviations: Trunk flexed;Shuffle;Step-through pattern     General Gait Details: pt walked in straight line and chair pulled behind.  Stairs            Wheelchair Mobility    Modified Rankin (Stroke Patients Only)       Balance Overall balance assessment: Mild deficits observed, not formally tested                                           Pertinent Vitals/Pain Pain Assessment: 0-10 Pain Score: 6  Pain Location: right side of abdomen Pain Descriptors / Indicators: Aching;Discomfort;Operative site guarding Pain Intervention(s): Limited activity within patient's tolerance;Monitored during session;Repositioned    Home Living Family/patient expects to be discharged to:: Private residence Living Arrangements: Spouse/significant other Available Help at Discharge: Family Type of Home: Apartment Home Access: Level entry     Home Layout: One level Home Equipment: Cane - single point;Walker - 2 wheels      Prior Function Level of Independence: Independent with  assistive device(s)         Comments: pt reports he walked with a cane due to his sore knee.  denies falls     Hand Dominance        Extremity/Trunk Assessment        Lower Extremity Assessment Lower Extremity Assessment: Generalized weakness;LLE deficits/detail LLE Deficits / Details: was going to have TKR but got cancelled    Cervical / Trunk Assessment Cervical / Trunk Assessment: Normal  Communication    Communication: No difficulties  Cognition Arousal/Alertness: Lethargic;Suspect due to medications Behavior During Therapy: [facility] for tasks assessed/performed Overall Cognitive Status: Within Functional Limits for tasks assessed                                 General Comments: jp drain intact throughout session      General Comments General comments (skin integrity, edema, etc.): nursing assisted with walking.  O2 sats stayed over 95% on room air. Pt lethargic due to pain meds    Exercises     Assessment/Plan    PT Assessment Patient needs continued PT services  PT Problem List Decreased strength;Decreased mobility;Decreased knowledge of precautions;Decreased activity tolerance;Cardiopulmonary status limiting activity;Decreased skin integrity;Decreased knowledge of use of DME;Pain       PT Treatment Interventions DME instruction;Therapeutic activities;Gait training;Therapeutic exercise;Patient/family education;Functional mobility training    PT Goals (Current goals can be found in the Care Plan section)  Acute Rehab PT Goals Patient Stated Goal: to get home soon PT Goal Formulation: With patient/family Time For Goal Achievement: 02/17/19 Potential to Achieve Goals: Good    Frequency Min 3X/week   Barriers to discharge        Co-evaluation               AM-PAC PT "6 Clicks" Mobility  Outcome Measure Help needed turning from your back to your side while in a flat bed without using bedrails?: A Lot Help needed moving from lying on your back to sitting on the side of a flat bed without using bedrails?: A Lot Help needed moving to and from a bed to a chair (including a wheelchair)?: A Lot Help needed standing up from a chair using your arms (e.g., wheelchair or bedside chair)?: A Lot Help needed to walk in hospital room?: A Lot Help needed climbing 3-5 steps with a railing? : Total 6 Click Score: 11    End of Session   Activity Tolerance: Patient  tolerated treatment well;No increased pain Patient left: in chair;with nursing/sitter in room;with call bell/phone within reach Nurse Communication: Mobility status;Precautions PT Visit Diagnosis: Unsteadiness on feet (R26.81);Other abnormalities of gait and mobility (R26.89);Muscle weakness (generalized) (M62.81);Pain    Time: [id number] PT Time Calculation (min) (ACUTE ONLY): 33 min   Charges:   PT Evaluation $PT Eval Low Complexity: 1 Low PT Treatments $Gait Training: 8-22 mins        02/03/2019   Rande Lawman, PT   Loyal Buba 02/03/2019, 1:05 PM

## 2019-02-03 NOTE — Progress Notes (Signed)
Patient ID: Phillip Maldonado, male   DOB: [birth year], 68 y.o.   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 fib, chronic abdominal pain, recent treatment for upper GI bleeding presented with acute abdominal pain.  He was admitted for gallstone pancreatitis.  He was noted to have septic shock on 01/21/2019 and was transferred to ICU.  He was eventually weaned off of pressors and transferred back to [facility] service.  MRCP showed acute pancreatitis, cholelithiasis, no CBD obstruction.  General surgery was consulted.  He was also found to have worsening creatinine.  Nephrology was consulted.  He needed temporary hemodialysis.  He underwent cholecystectomy on 02/02/2019.  Assessment & Plan:   Sepsis/septic shock: Present on admission Acute cholecystitis Acute gallstone pancreatitis -Initially required ICU care and vasopressors.  After blood pressure improved, he was transferred to the floor under [facility] care. -MRCP showed mild peripancreatic fluid along pancreatic head/uncinate process related to acute pancreatitis.  No fluid collection or pseudocyst.  Cholelithiasis -HIDA scan was ordered: Patient refused -GI was consulted: Recommended outpatient follow-up with Dr. Carlean Purl in 2 to 4 weeks -Was on Rocephin and Flagyl: Currently on Unasyn -Blood cultures on 01/21/2019: Anaerobic bottle only with gram-negative rods: Bacteroides vulgatus -Status post laparoscopic converted to open cholecystectomy on 02/02/2019.  Diet/pain management/wound care as per general surgery recommendations. -PT eval  Leukocytosis -Monitor  Acute kidney injury on chronic kidney disease stage III -developed AKI in the setting of septic shock.  Creatinine peaked to 8.26 -Renal ultrasound showed no obstruction.   Urine culture showed no growth.   -Nephrology following : Status post HD x2, last HD was on 01/27/2019.  Currently HD on hold.  Getting intravenous Lasix intermittently.  Follow urine output. -Creatinine 4.69 today.  Acute respiratory failure with hypoxia -Due to combination of COPD/heart failure/OSA -Not on oxygen at home -Currently on 2 L nasal cannula.  Incentive spirometry.  Essential hypertension -Blood pressure currently stable.  Continue amlodipine and metoprolol  Hyperlipidemia -Statin on hold due to elevated LFTs  Chronic diastolic CHF -Echo showed EF of 60 to 65% with grade 1 diastolic dysfunction -Nephrology following and managing volume.  Strict input and output and daily weights.  Diabetes mellitus type 2 uncontrolled with hyperglycemia -Continue Lantus.  Continue CBGs with SSI.  Left knee pain --Patient was supposed to have some type of procedure few months ago however this was delayed as his wife had Covid. -Orthopedics consulted and appreciated, status post left knee aspiration and injection (under 90 mL of clear fluid was obtained, 6 mL of 0.5% Marcaine and 80 mg of Depo-Medrol injected)  GERD/history of GI bleed -Continue PPI  History of paroxysmal A. Fib -Currently rate controlled and in sinus rhythm.  Not on anticoagulation as an outpatient.  Continue metoprolol.  Morbid obesity  -outpatient follow-up  Generalized deconditioning -PT eval   DVT prophylaxis: Heparin Code Status: Full Family Communication: Spoke to patient at bedside Disposition Plan: Depends on further general surgery recommendations and PT eval  Consultants: PCCM/general surgery/nephrology/gastroenterology's orthopedic surgery  Procedures:  Echo IMPRESSIONS    1. Left ventricular ejection fraction, by visual estimation, is 60 to 65%. The left ventricle has normal function. There is no left ventricular hypertrophy.  2. Left ventricular diastolic parameters are consistent with  Grade I diastolic dysfunction (impaired  relaxation).  3. Global right ventricle has normal systolic function.The right ventricular size is normal. No increase in right ventricular wall thickness.  4. Left atrial size was mildly dilated.  5. Right atrial size was normal.  6. Moderate mitral annular calcification.  7. The mitral valve is normal in structure. No evidence of mitral valve regurgitation. No evidence of mitral stenosis.  8. The tricuspid valve is normal in structure. Tricuspid valve regurgitation is not demonstrated.  9. The aortic valve is tricuspid. Aortic valve regurgitation is not visualized. Mild aortic valve sclerosis without stenosis. 10. The pulmonic valve was normal in structure. Pulmonic valve regurgitation is not visualized. 11. The inferior vena cava is normal in size with greater than 50% respiratory variability, suggesting right atrial pressure of 3 mmHg. 12. The interatrial septum was not well visualized.  Left knee aspiration and injection  Insertion of right IJ nontunneled HD cath  laparoscopic converted to open cholecystectomy on 02/02/2019   Antimicrobials:  Anti-infectives (From admission, onward)   Start     Dose/Rate Route Frequency Ordered Stop   01/31/19 2200  Ampicillin-Sulbactam (UNASYN) 3 g in sodium chloride 0.9 % 100 mL IVPB     3 g 200 mL/hr over 30 Minutes Intravenous Every 12 hours 01/31/19 1051     01/31/19 1000  Ampicillin-Sulbactam (UNASYN) 3 g in sodium chloride 0.9 % 100 mL IVPB  Status:  Discontinued     3 g 200 mL/hr over 30 Minutes Intravenous Every 24 hours 01/31/19 0859 01/31/19 1051   01/23/19 1400  piperacillin-tazobactam (ZOSYN) IVPB 2.25 g  Status:  Discontinued     2.25 g 100 mL/hr over 30 Minutes Intravenous Every 8 hours 01/23/19 0717 01/23/19 0947   01/23/19 1400  cefTRIAXone (ROCEPHIN) 2 g in sodium chloride 0.9 % 100 mL IVPB  Status:  Discontinued     2 g 200 mL/hr over 30 Minutes Intravenous Every 24 hours 01/23/19 0947  01/31/19 0857   01/23/19 1400  metroNIDAZOLE (FLAGYL) IVPB 500 mg  Status:  Discontinued     500 mg 100 mL/hr over 60 Minutes Intravenous Every 8 hours 01/23/19 0947 01/31/19 0857   01/21/19 1400  piperacillin-tazobactam (ZOSYN) IVPB 3.375 g  Status:  Discontinued     3.375 g 12.5 mL/hr over 240 Minutes Intravenous Every 8 hours 01/21/19 1325 01/23/19 0717      Subjective: Patient seen and examined at bedside.  Poor historian.  Denies worsening abdominal pain.  No overnight fever, nausea or vomiting.  Sleepy, wakes up slightly. Objective: Vitals:   02/02/19 1953 02/02/19 2010 02/03/19 0444 02/03/19 0449  BP: (!) 125/95 128/88 (!) 143/72   Pulse: 70  66   Resp: 16  16   Temp: (!) 97.4 F (36.3 C) 98 F (36.7 C) 97.7 F (36.5 C)   TempSrc: Oral  Oral   SpO2: 99%  96%   Weight:    (!) 155.1 kg  Height:        Intake/Output Summary (Last 24 hours) at 02/03/2019 0755 Last data filed at 02/03/2019 0600 Gross per 24 hour  Intake 2090 ml  Output 1760 ml  Net 330 ml   Filed Weights   02/01/19 1611 02/02/19 0327 02/03/19 0449  Weight: (!) 155.9 kg (!) 156.8 kg (!) 155.1 kg    Examination:  General exam: No distress.  Looks chronically ill.  Sleepy, wakes up slightly Respiratory system: Bilateral decreased breath sounds mostly at the bases with scattered crackles.  No wheezing  cardiovascular system: S1-S2 heard,  rate controlled Gastrointestinal system: Abdomen is nondistended, soft and dressing present with JP drain and surrounding tenderness. Normal bowel sounds heard. Extremities: No cyanosis; trace bilateral lower extremity edema.  Right upper extremity edema present  Data Reviewed: I have personally reviewed following labs and imaging studies  CBC: Recent Labs  Lab 01/30/19 0306 02/01/19 0450 02/02/19 0444 02/02/19 2206 02/03/19 0353  WBC  --  18.4* 18.9* 27.7* 19.6*  NEUTROABS  --   --  14.8*  --  16.3*  HGB 12.5* 13.2 12.8* 12.3* 11.2*  HCT 39.8 41.7 40.8 39.4  35.6*  MCV  --  90.5 89.5 91.8 91.3  PLT  --  409* 353 449* 0000000   Basic Metabolic Panel: Recent Labs  Lab 01/28/19 0428 01/29/19 0329 01/30/19 0306 01/31/19 0350 02/01/19 0450 02/02/19 0444 02/03/19 0353  NA 135 136 136 137 139 139 139  K 4.3 4.5 4.1 4.0 3.8 3.6 4.0  CL 98 100 99 101 101 102 103  CO2 21* 21* 19* 19* 19* 23 22  GLUCOSE 188* 226* 211* 215* 164* 158* 185*  BUN 57* 76* 93* 104* 110* 64* 74*  CREATININE 6.74* 7.33* 7.57* 7.30* 7.01* 4.74* 4.69*  CALCIUM 8.4* 8.5* 8.5* 8.5* 8.6* 8.3* 8.6*  MG  --   --   --   --   --  2.0  --   PHOS 6.1* 7.9* 8.8* 9.5* 9.6*  --   --    GFR: Estimated Creatinine Clearance: 22.6 mL/min (A) (by C-G formula based on SCr of 4.69 mg/dL (H)). Liver Function Tests: Recent Labs  Lab 01/30/19 0306 01/31/19 0350 02/01/19 0450 02/02/19 0444 02/03/19 0353  AST  --   --   --  14* 31  ALT  --   --   --  12 18  ALKPHOS  --   --   --  81 69  BILITOT  --   --   --  0.7 0.8  PROT  --   --   --  6.6 6.5  ALBUMIN 2.0* 2.1* 2.3* 2.4* 2.5*   No results for input(s): LIPASE, AMYLASE in the last 168 hours. No results for input(s): AMMONIA in the last 168 hours. Coagulation Profile: No results for input(s): INR, PROTIME in the last 168 hours. Cardiac Enzymes: No results for input(s): CKTOTAL, CKMB, CKMBINDEX, TROPONINI in the last 168 hours. BNP (last 3 results) No results for input(s): PROBNP in the last 8760 hours. HbA1C: No results for input(s): HGBA1C in the last 72 hours. CBG: Recent Labs  Lab 02/02/19 1719 02/02/19 1951 02/02/19 2356 02/03/19 0446 02/03/19 0746  GLUCAP 245* 223* 202* 170* 166*   Lipid Profile: No results for input(s): CHOL, HDL, LDLCALC, TRIG, CHOLHDL, LDLDIRECT in the last 72 hours. Thyroid Function Tests: No results for input(s): TSH, T4TOTAL, FREET4, T3FREE, THYROIDAB in the last 72 hours. Anemia Panel: No results for input(s): VITAMINB12, FOLATE, FERRITIN, TIBC, IRON, RETICCTPCT in the last 72 hours.  Sepsis Labs: No results for input(s): PROCALCITON, LATICACIDVEN in the last 168 hours.  Recent Results (from the past 240 hour(s))  Body fluid culture     Status: None   Collection Time: 01/26/19 12:43 PM   Specimen: Body Fluid  Result Value Ref Range Status   Specimen Description FLUID  Final   Special Requests SYNOVIAL KNEE  Final   Gram Stain   Final    WBC PRESENT,BOTH PMN AND MONONUCLEAR NO ORGANISMS SEEN CYTOSPIN SMEAR    Culture   Final  NO GROWTH 3 DAYS Performed at [facility], [place] [address], [place], [place] [address]    Report Status 01/29/2019 FINAL  Final         Radiology Studies: No results found.      Scheduled Meds: . amLODipine  5 mg Oral Daily  . Chlorhexidine Gluconate Cloth  6 each Topical Daily  . DULoxetine  60 mg Oral Daily  . heparin injection (subcutaneous)  5,000 Units Subcutaneous Q8H  . HYDROmorphone   Intravenous Q4H  . insulin aspart  0-5 Units Subcutaneous QHS  . insulin aspart  0-9 Units Subcutaneous TID WC  . insulin glargine  20 Units Subcutaneous Q2200  . mouth rinse  15 mL Mouth Rinse BID  . metoprolol succinate  100 mg Oral Daily  . pantoprazole  40 mg Oral Daily  . predniSONE  20 mg Oral Q breakfast  . simethicone  80 mg Oral QID  . sodium chloride flush  10-40 mL Intracatheter Q12H  . thiamine  100 mg Oral Daily  . traZODone  300 mg Oral QHS   Continuous Infusions: . sodium chloride 50 mL/hr at 02/03/19 0459  . ampicillin-sulbactam (UNASYN) IV 3 g (02/03/19 0449)          Aline August, MD [facility] 02/03/2019, 7:55 AM

## 2019-02-03 NOTE — TOC Initial Note (Signed)
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 fib, chronic abdominal pain, recent treatment for upper GI bleeding presented with acute abdominal pain. Patient presents with a high hospital readmission risk score. TOC team following for potential discharge needs.     Barriers to Discharge: Continued Medical Work up   Patient Goals and CMS Choice        Expected Discharge Plan and Services         Living arrangements for the past 2 months: Single Family Home                                      Prior Living Arrangements/Services Living arrangements for the past 2 months: Single Family Home Lives with:: Spouse Patient language and need for interpreter reviewed:: Yes        Need for Family Participation in Patient Care: No (Comment) Care giver support system in place?: Yes (comment)   Criminal Activity/Legal Involvement Pertinent to Current Situation/Hospitalization: No - Comment as needed  Activities of Daily Living Home Assistive Devices/Equipment: Eyeglasses, Environmental consultant (specify type), Cane (specify quad or straight) ADL Screening (condition at time of admission) Patient's cognitive ability adequate to safely complete daily activities?: No Is the patient deaf or have difficulty hearing?: No Does the patient have difficulty seeing, even when wearing glasses/contacts?: No Does the patient have difficulty concentrating, remembering, or making decisions?: No Patient able to express need for assistance with ADLs?: Yes Does the patient have difficulty dressing or bathing?: Yes Independently performs ADLs?:  No Does the patient have difficulty walking or climbing stairs?: Yes Weakness of Legs: Both Weakness of Arms/Hands: None  Permission Sought/Granted                  Emotional Assessment Appearance:: Appears stated age     Orientation: : Oriented to Self, Oriented to Place, Oriented to  Time, Oriented to Situation Alcohol / Substance Use: Not Applicable Psych Involvement: No (comment)  Admission diagnosis:  Atypical chest pain [R07.89] Bilious vomiting with nausea [R11.14] Abdominal pain [R10.9] Patient Active Problem List   Diagnosis Date Noted  . Left knee pain 01/27/2019  . Gouty arthritis 01/27/2019  . BMI 50.0-59.9, adult ([place]) 01/22/2019  . Elevated LFTs 01/22/2019  . Elevated amylase and lipase 01/22/2019  . Acute pancreatitis 01/22/2019  . Septic shock ([place])   . Encounter for central line placement   . Abdominal pain 01/20/2019  . Malaise 06/10/2016  . Dysuria 06/10/2016  . CKD (chronic kidney disease) stage 3, GFR 30-59 ml/min 01/08/2016  . History of recent fall 08/06/2015  . Falls frequently 08/06/2015  . Obesity, morbid ([place]) 07/30/2015  . Pain in joint, ankle and foot 07/23/2015  . Encounter for long-term methadone use 04/26/2015  . Depression, major 04/24/2015  . Osteoarthritis of both knees 12/26/2014  . Neuropathy, median nerve 09/11/2014  . Allergic rhinitis 08/29/2014  . Polydipsia 08/29/2014  . Hereditary and idiopathic peripheral neuropathy 08/15/2014  . Pain in joint, shoulder region 08/15/2014  .  Edema 06/20/2014  . Fundic gland polyps of stomach, benign   . Colon cancer screening   . Anemia 05/16/2014  . Intertrigo 04/11/2014  . B12 deficiency anemia 04/11/2014  . Rash and nonspecific skin eruption 04/11/2014  . DM type 2, uncontrolled, with neuropathy ([place]) 12/26/2013  . PVD (peripheral vascular disease) ([place]) 12/26/2013  . Congestive heart disease ([place]) 12/26/2013  . Facet arthropathy, lumbar 09/26/2013  . Chronic pain syndrome  09/16/2013  . History of marginal ulcers 01/31/2013  . Atrial fibrillation with RVR ([place]) 01/24/2013  . History of gastric bypass 01/02/2013  . Rotator cuff tear arthropathy 12/19/2012  . HTN (hypertension) 09/18/2011  . Hypercholesterolemia 09/18/2011  . OSA (obstructive sleep apnea) 09/18/2011  . Constipation 05/09/2011   PCP:  Patient, No Pcp Per Pharmacy:   [facility] [place], [place] - [address] [place] [address] Phone: [phone] Fax: [phone]     Social Determinants of Health (SDOH) Interventions    Readmission Risk Interventions No flowsheet data found.

## 2019-02-03 NOTE — Progress Notes (Signed)
[facility] Progress Note    Assessment/ Plan:   1. Acute renal failure - combination of IV contrast/ hypotension-shock w/  ARB/ACEi on board. Creat worsened and is now sp HD x 2, last Friday 11/13. Evidence of incipient recovery and is making good UOP on Lasix- however BUN up and he has some sleepiness/ abd pain/ bloating--> got last session of HD 11/18, will continue to watch for signs of recovery.  Robust UOP off Lasix, will hold for today and will resume tomorrow.  Would recommend tapering prednisone if able as well as this can drive up azotemia.  2. Septic shock/ abd pain - resolved, suspected gallstone pancreatitis, surgery consulted--> to OR tomorrow.  On Unasyn  3. Chronic pain  4. Depression  5. Chronic CHF-- some volume overload which is improved with IV Lasix   Subjective:    Had lap converted to open ccy yesterday.  On PCA.  Has good UOP and Cr inching downwards.     Objective:   BP (!) 145/76 (BP Location: Right Arm)   Pulse 65   Temp (!) 97.5 F (36.4 C) (Oral)   Resp 20   Ht 5\' 9"  (1.753 m)   Wt (!) 155.1 kg   SpO2 96%   BMI 50.49 kg/m   Intake/Output Summary (Last 24 hours) at 02/03/2019 1006 Last data filed at 02/03/2019 0600 Gross per 24 hour  Intake 1590 ml  Output 1760 ml  Net -170 ml   Weight change: -2.7 kg  Physical Exam: Gen: appears uncomfortable CVS: RRR no m/r/g Resp: clear bilaterally no c/w/r Abd: soft, lap ports and open ccy incision c/d/i Ext: 1+ anasarca, improved Dialysis Access: R IJ nontunneled HD cath  Imaging: No results found.  Labs: BMET Recent Labs  Lab 01/28/19 0428 01/29/19 0329 01/30/19 0306 01/31/19 0350 02/01/19 0450 02/02/19 0444 02/03/19 0353  NA 135 136 136 137 139 139 139  K 4.3 4.5 4.1 4.0 3.8 3.6 4.0  CL 98 100 99 101 101 102 103  CO2 21* 21* 19* 19* 19* 23 22  GLUCOSE 188* 226* 211* 215* 164* 158* 185*  BUN 57* 76* 93* 104* 110* 64* 74*  CREATININE 6.74* 7.33* 7.57* 7.30* 7.01*  4.74* 4.69*  CALCIUM 8.4* 8.5* 8.5* 8.5* 8.6* 8.3* 8.6*  PHOS 6.1* 7.9* 8.8* 9.5* 9.6*  --   --    CBC Recent Labs  Lab 02/01/19 0450 02/02/19 0444 02/02/19 2206 02/03/19 0353  WBC 18.4* 18.9* 27.7* 19.6*  NEUTROABS  --  14.8*  --  16.3*  HGB 13.2 12.8* 12.3* 11.2*  HCT 41.7 40.8 39.4 35.6*  MCV 90.5 89.5 91.8 91.3  PLT 409* 353 449* 324    Medications:    . amLODipine  5 mg Oral Daily  . Chlorhexidine Gluconate Cloth  6 each Topical Daily  . DULoxetine  60 mg Oral Daily  . heparin injection (subcutaneous)  5,000 Units Subcutaneous Q8H  . HYDROmorphone   Intravenous Q4H  . insulin aspart  0-5 Units Subcutaneous QHS  . insulin aspart  0-9 Units Subcutaneous TID WC  . insulin glargine  20 Units Subcutaneous Q2200  . mouth rinse  15 mL Mouth Rinse BID  . metoprolol succinate  100 mg Oral Daily  . pantoprazole  40 mg Oral Daily  . predniSONE  20 mg Oral Q breakfast  . simethicone  80 mg Oral QID  . sodium chloride flush  10-40 mL Intracatheter Q12H  . thiamine  100 mg Oral Daily  .  traZODone  300 mg Oral QHS      Madelon Lips MD 02/03/2019, 10:06 AM

## 2019-02-03 NOTE — Progress Notes (Signed)
[facility] Progress Note  1 Day Post-Op   Assessment/Plan Sepsis/shock Respiratory failure with hypoxia Elevated LFTs-LFTs and Tbili all normalized on 11/13 Uncontrolled type 2 diabetes with diabetic neuropathy Hypertension GERD/Hx GI bleed AKI: intermittent HD per renal Hx AF Hx alcoholism/Gastric bypass Morbid obesity BMI 53.8  Pancreatitis, abdominal pain, cholelithiasis and jaundice - S/P laparoscopic converted to open cholecystectomy, Dr. Ninfa Linden, 11/19 - continue PCA, advance diet as tolerated, ambulate, IS  FEN:CLD [id number] 11/7-11/9; Rocephin/Flagyl 11/9-11/17; Unasyn 11/17>> [id number] Follow up: TBD  Plan: advance diet as tolerated. Monitor Hgb and drain output. Ambulate.    LOS: 14 days    Subjective: CC: abdominal pain  Pt is sleepy but wakes. He denies issues overnight. He denies nausea or vomiting. He endorses flatus.   Objective: Vital signs in last 24 hours: Temp:  [97.4 F (36.3 C)-98 F (36.7 C)] 97.7 F (36.5 C) (11/20 0444) Pulse Rate:  [66-70] 66 (11/20 0444) Resp:  [16-22] 16 (11/20 0444) BP: (110-143)/(50-95) 143/72 (11/20 0444) SpO2:  [88 %-100 %] 96 % (11/20 0444) Weight:  [155.1 kg] 155.1 kg (11/20 0449) Last BM Date: 01/31/19  Intake/Output from previous day: 11/19 0701 - 11/20 0700 In: 2090 [P.O.:440; I.V.:975; IV Piggyback:350] Out: 2660 [Urine:2115; Drains:145; Blood:400] Intake/Output this shift: No intake/output data recorded.  PE:  Gen: Alert, NAD, pleasant Pulm: rate and effort normal Abd: Soft, obese,slightlydistended, normal BS mixed with tinkling, RUQ incision with honeycomb is bloody but no active bleeding noted. Port site incisions C/D/I. JP drain with sanguinous drainage. TTP RUQ. No peritonitis.  Skin: warm and dry Psych: A&Ox3    Anti-infectives: Anti-infectives (From admission, onward)   Start     Dose/Rate Route Frequency Ordered Stop   01/31/19 2200   Ampicillin-Sulbactam (UNASYN) 3 g in sodium chloride 0.9 % 100 mL IVPB     3 g 200 mL/hr over 30 Minutes Intravenous Every 12 hours 01/31/19 1051     01/31/19 1000  Ampicillin-Sulbactam (UNASYN) 3 g in sodium chloride 0.9 % 100 mL IVPB  Status:  Discontinued     3 g 200 mL/hr over 30 Minutes Intravenous Every 24 hours 01/31/19 0859 01/31/19 1051   01/23/19 1400  piperacillin-tazobactam (ZOSYN) IVPB 2.25 g  Status:  Discontinued     2.25 g 100 mL/hr over 30 Minutes Intravenous Every 8 hours 01/23/19 0717 01/23/19 0947   01/23/19 1400  cefTRIAXone (ROCEPHIN) 2 g in sodium chloride 0.9 % 100 mL IVPB  Status:  Discontinued     2 g 200 mL/hr over 30 Minutes Intravenous Every 24 hours 01/23/19 0947 01/31/19 0857   01/23/19 1400  metroNIDAZOLE (FLAGYL) IVPB 500 mg  Status:  Discontinued     500 mg 100 mL/hr over 60 Minutes Intravenous Every 8 hours 01/23/19 0947 01/31/19 0857   01/21/19 1400  piperacillin-tazobactam (ZOSYN) IVPB 3.375 g  Status:  Discontinued     3.375 g 12.5 mL/hr over 240 Minutes Intravenous Every 8 hours 01/21/19 1325 01/23/19 0717      Lab Results:  Recent Labs    02/02/19 2206 02/03/19 0353  WBC 27.7* 19.6*  HGB 12.3* 11.2*  HCT 39.4 35.6*  PLT 449* 324   BMET Recent Labs    02/02/19 0444 02/03/19 0353  NA 139 139  K 3.6 4.0  CL 102 103  CO2 23 22  GLUCOSE 158* 185*  BUN 64* 74*  CREATININE 4.74* 4.69*  CALCIUM 8.3* 8.6*   PT/INR No results for input(s): LABPROT, INR in the last 72 hours.  CMP     Component Value Date/Time   NA 139 02/03/2019 0353   NA 139 02/12/2015 1424   K 4.0 02/03/2019 0353   CL 103 02/03/2019 0353   CO2 22 02/03/2019 0353   GLUCOSE 185 (H) 02/03/2019 0353   BUN 74 (H) 02/03/2019 0353   BUN 15 02/12/2015 1424   CREATININE 4.69 (H) 02/03/2019 0353   CREATININE 1.14 10/01/2016 1504   CALCIUM 8.6 (L) 02/03/2019 0353   PROT 6.5 02/03/2019 0353   PROT 6.7 10/11/2014 0833   ALBUMIN 2.5 (L) 02/03/2019 0353   ALBUMIN 3.8  10/11/2014 0833   AST 31 02/03/2019 0353   ALT 18 02/03/2019 0353   ALKPHOS 69 02/03/2019 0353   BILITOT 0.8 02/03/2019 0353   BILITOT 0.4 10/11/2014 0833   GFRNONAA 12 (L) 02/03/2019 0353   GFRNONAA 67 10/01/2016 1504   GFRAA 14 (L) 02/03/2019 0353   GFRAA 78 10/01/2016 1504   Lipase     Component Value Date/Time   LIPASE 38 01/24/2019 0150    Studies/Results: No results found.   Kalman Drape, PA-C [facility] Please see amion for pager for the following: Myna Hidalgo, W, & Friday 7:00am - 4:30pm Thursdays 7:00am -11:30am

## 2019-02-04 LAB — CBC WITH DIFFERENTIAL/PLATELET
Abs Immature Granulocytes: 0.26 10*3/uL — ABNORMAL HIGH (ref 0.00–0.07)
Basophils Absolute: 0 10*3/uL (ref 0.0–0.1)
Basophils Relative: 0 %
Eosinophils Absolute: 0.4 10*3/uL (ref 0.0–0.5)
Eosinophils Relative: 3 %
HCT: 33.6 % — ABNORMAL LOW (ref 39.0–52.0)
Hemoglobin: 10.6 g/dL — ABNORMAL LOW (ref 13.0–17.0)
Immature Granulocytes: 2 %
Lymphocytes Relative: 12 %
Lymphs Abs: 1.7 10*3/uL (ref 0.7–4.0)
MCH: 28.5 pg (ref 26.0–34.0)
MCHC: 31.5 g/dL (ref 30.0–36.0)
MCV: 90.3 fL (ref 80.0–100.0)
Monocytes Absolute: 0.9 10*3/uL (ref 0.1–1.0)
Monocytes Relative: 6 %
Neutro Abs: 11 10*3/uL — ABNORMAL HIGH (ref 1.7–7.7)
Neutrophils Relative %: 77 %
Platelets: 295 10*3/uL (ref 150–400)
RBC: 3.72 MIL/uL — ABNORMAL LOW (ref 4.22–5.81)
RDW: 16.4 % — ABNORMAL HIGH (ref 11.5–15.5)
WBC: 14.2 10*3/uL — ABNORMAL HIGH (ref 4.0–10.5)
nRBC: 0 % (ref 0.0–0.2)

## 2019-02-04 LAB — COMPREHENSIVE METABOLIC PANEL
ALT: 15 U/L (ref 0–44)
AST: 23 U/L (ref 15–41)
Albumin: 2.3 g/dL — ABNORMAL LOW (ref 3.5–5.0)
Alkaline Phosphatase: 68 U/L (ref 38–126)
Anion gap: 12 (ref 5–15)
BUN: 65 mg/dL — ABNORMAL HIGH (ref 8–23)
CO2: 20 mmol/L — ABNORMAL LOW (ref 22–32)
Calcium: 8.4 mg/dL — ABNORMAL LOW (ref 8.9–10.3)
Chloride: 107 mmol/L (ref 98–111)
Creatinine, Ser: 3.87 mg/dL — ABNORMAL HIGH (ref 0.61–1.24)
GFR calc Af Amer: 18 mL/min — ABNORMAL LOW (ref >60)
GFR calc non Af Amer: 15 mL/min — ABNORMAL LOW (ref >60)
Glucose, Bld: 155 mg/dL — ABNORMAL HIGH (ref 70–99)
Potassium: 3.7 mmol/L (ref 3.5–5.1)
Sodium: 139 mmol/L (ref 135–145)
Total Bilirubin: 0.9 mg/dL (ref 0.3–1.2)
Total Protein: 6.2 g/dL — ABNORMAL LOW (ref 6.5–8.1)

## 2019-02-04 LAB — MAGNESIUM: Magnesium: 2 mg/dL (ref 1.7–2.4)

## 2019-02-04 LAB — GLUCOSE, CAPILLARY
Glucose-Capillary: 108 mg/dL — ABNORMAL HIGH (ref 70–99)
Glucose-Capillary: 162 mg/dL — ABNORMAL HIGH (ref 70–99)
Glucose-Capillary: 207 mg/dL — ABNORMAL HIGH (ref 70–99)
Glucose-Capillary: 143 mg/dL — ABNORMAL HIGH (ref 70–99)

## 2019-02-04 MED ORDER — NALOXONE HCL 0.4 MG/ML IJ SOLN: 0.4000 mg | INTRAMUSCULAR | Status: DC | PRN

## 2019-02-04 MED ORDER — ONDANSETRON HCL 4 MG/2ML IJ SOLN: 4.0000 mg | Freq: Four times a day (QID) | INTRAMUSCULAR | Status: DC | PRN

## 2019-02-04 MED ORDER — SODIUM CHLORIDE 0.9% FLUSH: 9.0000 mL | INTRAVENOUS | Status: DC | PRN

## 2019-02-04 MED ORDER — ACETAMINOPHEN 325 MG PO TABS: 650.0000 mg | ORAL_TABLET | Freq: Four times a day (QID) | ORAL | Status: DC | PRN

## 2019-02-04 MED ORDER — DIPHENHYDRAMINE HCL 12.5 MG/5ML PO ELIX: 12.5000 mg | ORAL_SOLUTION | Freq: Four times a day (QID) | ORAL | Status: DC | PRN

## 2019-02-04 MED ORDER — HYDROMORPHONE 1 MG/ML IV SOLN
INTRAVENOUS | Status: DC
  Administered 2019-02-04: 30 mg via INTRAVENOUS
  Administered 2019-02-05: 15 mg via INTRAVENOUS
  Administered 2019-02-05: 6 mg via INTRAVENOUS
  Filled 2019-02-04: qty 30

## 2019-02-04 MED ORDER — DIPHENHYDRAMINE HCL 50 MG/ML IJ SOLN: 12.5000 mg | Freq: Four times a day (QID) | INTRAMUSCULAR | Status: DC | PRN

## 2019-02-04 MED ORDER — OXYCODONE HCL 5 MG PO TABS
5.0000 mg | ORAL_TABLET | Freq: Four times a day (QID) | ORAL | Status: DC | PRN
  Administered 2019-02-04 – 2019-02-07 (×9): 5 mg via ORAL
  Filled 2019-02-04 (×9): qty 1

## 2019-02-04 NOTE — Progress Notes (Signed)
[facility] Progress Note    Assessment/ Plan:   1. Acute renal failure - combination of IV contrast/ hypotension-shock w/  ARB/ACEi on board. Creat worsened and is now sp HD x 2, last Friday 11/13. Evidence of incipient recovery and is making good UOP on Lasix- however BUN up and he has some sleepiness/ abd pain/ bloating--> got last session of HD 11/18, will continue to watch for signs of recovery.  Robust UOP off Lasix, will continue to hold.  If Cr downtrending and UOP still great tomorrow, will remove HD cath.   1. Septic shock/ abd pain - resolved, suspected gallstone pancreatitis, surgery consulted--> had ccy 11/19, on Unasyn  2. Chronic pain  3. Depression  4. Chronic CHF-- some volume overload which is improved with IV Lasix   Subjective:    3L urine yesterday with no IV Lasix.  Cr downtrending.  Pt sitting up in chair this AM, eating liquid tray, feeling better.     Objective:   BP (!) 141/76 (BP Location: Left Arm)   Pulse 68   Temp 97.8 F (36.6 C) (Oral)   Resp 20   Ht 5\' 9"  (1.753 m)   Wt (!) 156.3 kg   SpO2 98%   BMI 50.89 kg/m   Intake/Output Summary (Last 24 hours) at 02/04/2019 [id number] Last data filed at 02/04/2019 0700 Gross per 24 hour  Intake 3180.4 ml  Output 3683 ml  Net -502.6 ml   Weight change: 3.7 kg  Physical Exam: Gen: NAD, sitting up in chair CVS: RRR no m/r/g Resp: clear bilaterally no c/w/r Abd: soft, lap ports and open ccy incision c/d/i Ext: 1+ anasarca, improved, can see skin texture of legs now Dialysis Access: R IJ nontunneled HD cath  Imaging: No results found.  Labs: BMET Recent Labs  Lab 01/29/19 0329 01/30/19 0306 01/31/19 0350 02/01/19 0450 02/02/19 0444 02/03/19 0353 02/04/19 0254  NA 136 136 137 139 139 139 139  K 4.5 4.1 4.0 3.8 3.6 4.0 3.7  CL 100 99 101 101 102 103 107  CO2 21* 19* 19* 19* 23 22 20*  GLUCOSE 226* 211* 215* 164* 158* 185* 155*  BUN 76* 93* 104* 110* 64* 74* 65*   CREATININE 7.33* 7.57* 7.30* 7.01* 4.74* 4.69* 3.87*  CALCIUM 8.5* 8.5* 8.5* 8.6* 8.3* 8.6* 8.4*  PHOS 7.9* 8.8* 9.5* 9.6*  --   --   --    CBC Recent Labs  Lab 02/02/19 0444 02/02/19 2206 02/03/19 0353 02/04/19 0254  WBC 18.9* 27.7* 19.6* 14.2*  NEUTROABS 14.8*  --  16.3* 11.0*  HGB 12.8* 12.3* 11.2* 10.6*  HCT 40.8 39.4 35.6* 33.6*  MCV 89.5 91.8 91.3 90.3  PLT 353 449* 324 295    Medications:    . amLODipine  5 mg Oral Daily  . Chlorhexidine Gluconate Cloth  6 each Topical Daily  . DULoxetine  60 mg Oral Daily  . heparin injection (subcutaneous)  5,000 Units Subcutaneous Q8H  . HYDROmorphone   Intravenous Q4H  . insulin aspart  0-5 Units Subcutaneous QHS  . insulin aspart  0-9 Units Subcutaneous TID WC  . insulin glargine  20 Units Subcutaneous Q2200  . mouth rinse  15 mL Mouth Rinse BID  . metoprolol succinate  100 mg Oral Daily  . pantoprazole  40 mg Oral Daily  . predniSONE  10 mg Oral Q breakfast  . simethicone  80 mg Oral QID  . sodium chloride flush  10-40 mL Intracatheter Q12H  .  thiamine  100 mg Oral Daily  . traZODone  300 mg Oral QHS      Madelon Lips MD 02/04/2019, 9:52 AM

## 2019-02-04 NOTE — Progress Notes (Signed)
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 fib, chronic abdominal pain, recent treatment for upper GI bleeding presented with acute abdominal pain.  He was admitted for gallstone pancreatitis.  He was noted to have septic shock on 01/21/2019 and was transferred to ICU.  He was eventually weaned off of pressors and transferred back to [facility] service.  MRCP showed acute pancreatitis, cholelithiasis, no CBD obstruction.  General surgery was consulted.  He was also found to have worsening creatinine.  Nephrology was consulted.  He needed temporary hemodialysis.  He underwent cholecystectomy on 02/02/2019.    PT Comments    Patient seen for mobility progression. Pt able to ambulate 20 ft X 2 trials with min A and RW. Pt with flat affect and verbalized minimally during session. Pt will continue to benefit from further skilled PT services to maximize independence and safety with mobility.     Follow Up Recommendations  Home health PT;Supervision for mobility/OOB     Equipment Recommendations  None recommended by PT    Recommendations for Other Services       Precautions / Restrictions Precautions Precaution Comments: abdominal incision, bad right shoulder, bad left knee, B carpal tunnel Restrictions Weight Bearing Restrictions: No    Mobility  Bed Mobility               General bed mobility comments: Pt OOB in chair upon arrival   Transfers Overall transfer level: Needs assistance Equipment used: Rolling walker (2 wheeled) Transfers: Sit to/from Stand Sit to Stand: Min assist;Mod assist         General transfer comment: assist to power up into standing from recliner and BSC  Ambulation/Gait Ambulation/Gait assistance: Min assist Gait Distance  (Feet): (20 ft X 2 trials; pt needed to have a BM) Assistive device: Rolling walker (2 wheeled) Gait Pattern/deviations: Trunk flexed;Shuffle;Step-through pattern     General Gait Details: cues for upright posture   Stairs             Wheelchair Mobility    Modified Rankin (Stroke Patients Only)       Balance Overall balance assessment: Mild deficits observed, not formally tested                                          Cognition Arousal/Alertness: Lethargic;Suspect due to medications Behavior During Therapy: Flat affect Overall Cognitive Status: Within Functional Limits for tasks assessed                                 General Comments: pt verbalized very little during session       Exercises      General Comments        Pertinent Vitals/Pain Pain Assessment: Faces Faces Pain Scale: Hurts little more Pain Location: generalized with mobility Pain Descriptors / Indicators: Discomfort;Sore Pain Intervention(s): Limited activity within patient's tolerance;Monitored during session;Repositioned    Home Living                      Prior Function            PT Goals (current goals can now be  found in the care plan section) Progress towards PT goals: Progressing toward goals    Frequency    Min 3X/week      PT Plan Current plan remains appropriate    Co-evaluation              AM-PAC PT "6 Clicks" Mobility   Outcome Measure  Help needed turning from your back to your side while in a flat bed without using bedrails?: A Lot Help needed moving from lying on your back to sitting on the side of a flat bed without using bedrails?: A Lot Help needed moving to and from a bed to a chair (including a wheelchair)?: A Lot Help needed standing up from a chair using your arms (e.g., wheelchair or bedside chair)?: A Lot Help needed to walk in hospital room?: A Lot Help needed climbing 3-5 steps with a railing? :  Total 6 Click Score: 11    End of Session Equipment Utilized During Treatment: Gait belt Activity Tolerance: Patient tolerated treatment well;No increased pain Patient left: in chair;with call bell/phone within reach;with family/visitor present Nurse Communication: Mobility status PT Visit Diagnosis: Unsteadiness on feet (R26.81);Other abnormalities of gait and mobility (R26.89);Muscle weakness (generalized) (M62.81);Pain     Time: 1459-1530 PT Time Calculation (min) (ACUTE ONLY): 31 min  Charges:  $Gait Training: 23-37 mins                     Earney Navy, PTA Acute Rehabilitation Services Pager: [phone] Office: [phone]     Darliss Cheney 02/04/2019, 4:57 PM

## 2019-02-04 NOTE — Progress Notes (Signed)
Progress Note: General Surgery Service   Chief Complaint/Subjective: Pain controlled, tolerating liquids  Objective: Vital signs in last 24 hours: Temp:  [97.6 F (36.4 C)-97.9 F (36.6 C)] 97.8 F (36.6 C) (11/21 0822) Pulse Rate:  [65-77] 68 (11/21 0822) Resp:  [14-24] 20 (11/21 0804) BP: (120-162)/(69-93) 141/76 (11/21 0822) SpO2:  [92 %-98 %] 98 % (11/21 0822) Weight:  [156.3 kg-158.8 kg] 156.3 kg (11/21 0651) Last BM Date: 02/03/19  Intake/Output from previous day: 11/20 0701 - 11/21 0700 In: 3660.4 [P.O.:1560; I.V.:1733.5; IV Piggyback:276.9] Out: 3683 [Urine:3325; Drains:358] Intake/Output this shift: No intake/output data recorded.  Gen: NAd  Resp: nonlabored  Card: RRR  Abd: soft, ATTP, incisions c/d/i, drain with red/brown drainage  Lab Results: CBC  Recent Labs    02/03/19 0353 02/04/19 0254  WBC 19.6* 14.2*  HGB 11.2* 10.6*  HCT 35.6* 33.6*  PLT 324 295   BMET Recent Labs    02/03/19 0353 02/04/19 0254  NA 139 139  K 4.0 3.7  CL 103 107  CO2 22 20*  GLUCOSE 185* 155*  BUN 74* 65*  CREATININE 4.69* 3.87*  CALCIUM 8.6* 8.4*   PT/INR No results for input(s): LABPROT, INR in the last 72 hours. ABG No results for input(s): PHART, HCO3 in the last 72 hours.  Invalid input(s): PCO2, PO2  Anti-infectives: Anti-infectives (From admission, onward)   Start     Dose/Rate Route Frequency Ordered Stop   01/31/19 2200  Ampicillin-Sulbactam (UNASYN) 3 g in sodium chloride 0.9 % 100 mL IVPB     3 g 200 mL/hr over 30 Minutes Intravenous Every 12 hours 01/31/19 1051     01/31/19 1000  Ampicillin-Sulbactam (UNASYN) 3 g in sodium chloride 0.9 % 100 mL IVPB  Status:  Discontinued     3 g 200 mL/hr over 30 Minutes Intravenous Every 24 hours 01/31/19 0859 01/31/19 1051   01/23/19 1400  piperacillin-tazobactam (ZOSYN) IVPB 2.25 g  Status:  Discontinued     2.25 g 100 mL/hr over 30 Minutes Intravenous Every 8 hours 01/23/19 0717 01/23/19 0947   01/23/19  1400  cefTRIAXone (ROCEPHIN) 2 g in sodium chloride 0.9 % 100 mL IVPB  Status:  Discontinued     2 g 200 mL/hr over 30 Minutes Intravenous Every 24 hours 01/23/19 0947 01/31/19 0857   01/23/19 1400  metroNIDAZOLE (FLAGYL) IVPB 500 mg  Status:  Discontinued     500 mg 100 mL/hr over 60 Minutes Intravenous Every 8 hours 01/23/19 0947 01/31/19 0857   01/21/19 1400  piperacillin-tazobactam (ZOSYN) IVPB 3.375 g  Status:  Discontinued     3.375 g 12.5 mL/hr over 240 Minutes Intravenous Every 8 hours 01/21/19 1325 01/23/19 0717      Medications: Scheduled Meds: . amLODipine  5 mg Oral Daily  . Chlorhexidine Gluconate Cloth  6 each Topical Daily  . DULoxetine  60 mg Oral Daily  . heparin injection (subcutaneous)  5,000 Units Subcutaneous Q8H  . HYDROmorphone   Intravenous Q4H  . insulin aspart  0-5 Units Subcutaneous QHS  . insulin aspart  0-9 Units Subcutaneous TID WC  . insulin glargine  20 Units Subcutaneous Q2200  . mouth rinse  15 mL Mouth Rinse BID  . metoprolol succinate  100 mg Oral Daily  . pantoprazole  40 mg Oral Daily  . predniSONE  10 mg Oral Q breakfast  . simethicone  80 mg Oral QID  . sodium chloride flush  10-40 mL Intracatheter Q12H  . thiamine  100 mg Oral  Daily  . traZODone  300 mg Oral QHS   Continuous Infusions: . sodium chloride Stopped (02/04/19 0636)  . ampicillin-sulbactam (UNASYN) IV 200 mL/hr at 02/04/19 0700   PRN Meds:.bisacodyl, diphenhydrAMINE **OR** diphenhydrAMINE, hydrOXYzine, levalbuterol, naloxone **AND** sodium chloride flush, ondansetron **OR** ondansetron (ZOFRAN) IV, ondansetron (ZOFRAN) IV, oxyCODONE-acetaminophen, polyethylene glycol, sodium chloride flush  Assessment/Plan: s/p Procedure(s): attempted LAPAROSCOPIC CHOLECYSTECTOMY Cholecystectomy 02/02/2019  Pancreatitis, abdominal pain, cholelithiasis and jaundice - S/P laparoscopic converted to open cholecystectomy, Dr. Ninfa Linden, 11/19 - continue PCA, remove basal and add oral  medications, advance diet, ambulate, IS  FEN: carb mod diet [id number] 11/7-11/9; Rocephin/Flagyl 11/9-11/17; Unasyn 11/17>> [id number] Follow up: TBD   LOS: 15 days   Mickeal Skinner, MD [facility]

## 2019-02-04 NOTE — Progress Notes (Signed)
Patient ID: Phillip Maldonado, male   DOB: August 20, 1950, 68 y.o.   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 fib, chronic abdominal pain, recent treatment for upper GI bleeding presented with acute abdominal pain.  He was admitted for gallstone pancreatitis.  He was noted to have septic shock on 01/21/2019 and was transferred to ICU.  He was eventually weaned off of pressors and transferred back to [facility] service.  MRCP showed acute pancreatitis, cholelithiasis, no CBD obstruction.  General surgery was consulted.  He was also found to have worsening creatinine.  Nephrology was consulted.  He needed temporary hemodialysis.  He underwent cholecystectomy on 02/02/2019.  Assessment & Plan:   Sepsis/septic shock: Present on admission Acute cholecystitis Acute gallstone pancreatitis -Initially required ICU care and vasopressors.  After blood pressure improved, he was transferred to the floor under [facility] care. -MRCP showed mild peripancreatic fluid along pancreatic head/uncinate process related to acute pancreatitis.  No fluid collection or pseudocyst.  Cholelithiasis -HIDA scan was ordered: Patient refused -GI was consulted: Recommended outpatient follow-up with Dr. Carlean Purl in 2 to 4 weeks -Was on Rocephin and Flagyl: Currently on Unasyn -Blood cultures on 01/21/2019: Anaerobic bottle only with gram-negative rods: Bacteroides vulgatus -Status post laparoscopic converted to open cholecystectomy on 02/02/2019.  Diet/pain management/wound care/duration of antibiotic treatment as per general surgery recommendations. -PT recommends home health PT.  Leukocytosis -Improving.  Monitor  Acute kidney injury on chronic kidney disease stage III -developed AKI in the setting of septic shock.   Creatinine peaked to 8.26 -Renal ultrasound showed no obstruction.  Urine culture showed no growth.   -Nephrology following : Status post HD x2, last HD was on 01/27/2019.  Currently HD on hold.  Follow urine output. -Creatinine 3.87 today.  Acute respiratory failure with hypoxia -Due to combination of COPD/heart failure/OSA -Not on oxygen at home -Currently on room air.  Incentive spirometry.  Essential hypertension -Blood pressure currently stable.  Continue amlodipine and metoprolol  Hyperlipidemia -Statin on hold due to elevated LFTs  Chronic diastolic CHF -Echo showed EF of 60 to 65% with grade 1 diastolic dysfunction -Nephrology following and managing volume.  Strict input and output and daily weights.  Diabetes mellitus type 2 uncontrolled with hyperglycemia -Continue Lantus.  Continue CBGs with SSI.  Left knee pain --Patient was supposed to have some type of procedure few months ago however this was delayed as his wife had Covid. -Orthopedics consulted and appreciated, status post left knee aspiration and injection (under 90 mL of clear fluid was obtained, 6 mL of 0.5% Marcaine and 80 mg of Depo-Medrol injected)  GERD/history of GI bleed -Continue PPI  History of paroxysmal A. Fib -Currently rate controlled and in sinus rhythm.  Not on anticoagulation as an outpatient.  Continue metoprolol.  Morbid obesity  -outpatient follow-up  Generalized deconditioning -PT eval   DVT prophylaxis: Heparin Code Status: Full Family Communication: Spoke to patient at bedside Disposition Plan: Home with home health PT once cleared by general surgery Consultants: PCCM/general surgery/nephrology/gastroenterology's orthopedic surgery  Procedures:  Echo IMPRESSIONS    1. Left ventricular ejection fraction, by visual estimation, is 60 to 65%. The left ventricle has normal function. There is no left ventricular hypertrophy.  2. Left ventricular diastolic parameters are  consistent with Grade I diastolic dysfunction (  impaired relaxation).  3. Global right ventricle has normal systolic function.The right ventricular size is normal. No increase in right ventricular wall thickness.  4. Left atrial size was mildly dilated.  5. Right atrial size was normal.  6. Moderate mitral annular calcification.  7. The mitral valve is normal in structure. No evidence of mitral valve regurgitation. No evidence of mitral stenosis.  8. The tricuspid valve is normal in structure. Tricuspid valve regurgitation is not demonstrated.  9. The aortic valve is tricuspid. Aortic valve regurgitation is not visualized. Mild aortic valve sclerosis without stenosis. 10. The pulmonic valve was normal in structure. Pulmonic valve regurgitation is not visualized. 11. The inferior vena cava is normal in size with greater than 50% respiratory variability, suggesting right atrial pressure of 3 mmHg. 12. The interatrial septum was not well visualized.  Left knee aspiration and injection  Insertion of right IJ nontunneled HD cath  laparoscopic converted to open cholecystectomy on 02/02/2019   Antimicrobials:  Anti-infectives (From admission, onward)   Start     Dose/Rate Route Frequency Ordered Stop   01/31/19 2200  Ampicillin-Sulbactam (UNASYN) 3 g in sodium chloride 0.9 % 100 mL IVPB     3 g 200 mL/hr over 30 Minutes Intravenous Every 12 hours 01/31/19 1051     01/31/19 1000  Ampicillin-Sulbactam (UNASYN) 3 g in sodium chloride 0.9 % 100 mL IVPB  Status:  Discontinued     3 g 200 mL/hr over 30 Minutes Intravenous Every 24 hours 01/31/19 0859 01/31/19 1051   01/23/19 1400  piperacillin-tazobactam (ZOSYN) IVPB 2.25 g  Status:  Discontinued     2.25 g 100 mL/hr over 30 Minutes Intravenous Every 8 hours 01/23/19 0717 01/23/19 0947   01/23/19 1400  cefTRIAXone (ROCEPHIN) 2 g in sodium chloride 0.9 % 100 mL IVPB  Status:  Discontinued     2 g 200 mL/hr over 30 Minutes Intravenous Every 24 hours  01/23/19 0947 01/31/19 0857   01/23/19 1400  metroNIDAZOLE (FLAGYL) IVPB 500 mg  Status:  Discontinued     500 mg 100 mL/hr over 60 Minutes Intravenous Every 8 hours 01/23/19 0947 01/31/19 0857   01/21/19 1400  piperacillin-tazobactam (ZOSYN) IVPB 3.375 g  Status:  Discontinued     3.375 g 12.5 mL/hr over 240 Minutes Intravenous Every 8 hours 01/21/19 1325 01/23/19 0717      Subjective: Patient seen and examined at bedside.  Feels slightly better.  Denies overnight worsening abdominal pain.  No overnight fever or vomiting.   Objective: Vitals:   02/04/19 0349 02/04/19 0351 02/04/19 0651 02/04/19 0804  BP: (!) 162/81     Pulse: 68     Resp: 18 17 16 20   Temp: 97.8 F (36.6 C)     TempSrc: Oral     SpO2:  95% 96% 96%  Weight: (!) 158.8 kg  (!) 156.3 kg   Height:        Intake/Output Summary (Last 24 hours) at 02/04/2019 0808 Last data filed at 02/04/2019 0655 Gross per 24 hour  Intake 3310.8 ml  Output 3683 ml  Net -372.2 ml   Filed Weights   02/03/19 0449 02/04/19 0349 02/04/19 0651  Weight: (!) 155.1 kg (!) 158.8 kg (!) 156.3 kg    Examination:  General exam: No acute distress.  Looks chronically ill.   Respiratory system: Bilateral decreased breath sounds mostly at the bases with some crackles cardiovascular system: Rate controlled, S1-S2 heard Gastrointestinal system: Abdomen is nondistended, soft and dressing present with JP  drain and surrounding tenderness. Normal bowel sounds heard. Extremities: No cyanosis; trace bilateral lower extremity edema.  Right upper extremity edema present  Data Reviewed: I have personally reviewed following labs and imaging studies  CBC: Recent Labs  Lab 02/01/19 0450 02/02/19 0444 02/02/19 2206 02/03/19 0353 02/04/19 0254  WBC 18.4* 18.9* 27.7* 19.6* 14.2*  NEUTROABS  --  14.8*  --  16.3* 11.0*  HGB 13.2 12.8* 12.3* 11.2* 10.6*  HCT 41.7 40.8 39.4 35.6* 33.6*  MCV 90.5 89.5 91.8 91.3 90.3  PLT 409* 353 449* 324 [id number]    Basic Metabolic Panel: Recent Labs  Lab 01/29/19 0329 01/30/19 0306 01/31/19 0350 02/01/19 0450 02/02/19 0444 02/03/19 0353 02/04/19 0254  NA 136 136 137 139 139 139 139  K 4.5 4.1 4.0 3.8 3.6 4.0 3.7  CL 100 99 101 101 102 103 107  CO2 21* 19* 19* 19* 23 22 20*  GLUCOSE 226* 211* 215* 164* 158* 185* 155*  BUN 76* 93* 104* 110* 64* 74* 65*  CREATININE 7.33* 7.57* 7.30* 7.01* 4.74* 4.69* 3.87*  CALCIUM 8.5* 8.5* 8.5* 8.6* 8.3* 8.6* 8.4*  MG  --   --   --   --  2.0  --  2.0  PHOS 7.9* 8.8* 9.5* 9.6*  --   --   --    GFR: Estimated Creatinine Clearance: 27.5 mL/min (A) (by C-G formula based on SCr of 3.87 mg/dL (H)). Liver Function Tests: Recent Labs  Lab 01/31/19 0350 02/01/19 0450 02/02/19 0444 02/03/19 0353 02/04/19 0254  AST  --   --  14* 31 23  ALT  --   --  12 18 15   ALKPHOS  --   --  81 69 68  BILITOT  --   --  0.7 0.8 0.9  PROT  --   --  6.6 6.5 6.2*  ALBUMIN 2.1* 2.3* 2.4* 2.5* 2.3*   No results for input(s): LIPASE, AMYLASE in the last 168 hours. No results for input(s): AMMONIA in the last 168 hours. Coagulation Profile: No results for input(s): INR, PROTIME in the last 168 hours. Cardiac Enzymes: No results for input(s): CKTOTAL, CKMB, CKMBINDEX, TROPONINI in the last 168 hours. BNP (last 3 results) No results for input(s): PROBNP in the last 8760 hours. HbA1C: No results for input(s): HGBA1C in the last 72 hours. CBG: Recent Labs  Lab 02/03/19 0746 02/03/19 1156 02/03/19 1713 02/03/19 2120 02/04/19 0802  GLUCAP 166* 212* 255* 129* 108*   Lipid Profile: No results for input(s): CHOL, HDL, LDLCALC, TRIG, CHOLHDL, LDLDIRECT in the last 72 hours. Thyroid Function Tests: No results for input(s): TSH, T4TOTAL, FREET4, T3FREE, THYROIDAB in the last 72 hours. Anemia Panel: No results for input(s): VITAMINB12, FOLATE, FERRITIN, TIBC, IRON, RETICCTPCT in the last 72 hours. Sepsis Labs: No results for input(s): PROCALCITON, LATICACIDVEN in the last 168  hours.  Recent Results (from the past 240 hour(s))  Body fluid culture     Status: None   Collection Time: 01/26/19 12:43 PM   Specimen: Body Fluid  Result Value Ref Range Status   Specimen Description FLUID  Final   Special Requests SYNOVIAL KNEE  Final   Gram Stain   Final    WBC PRESENT,BOTH PMN AND MONONUCLEAR NO ORGANISMS SEEN CYTOSPIN SMEAR    Culture   Final    NO GROWTH 3 DAYS Performed at [facility], [address], [place], [place] [address]    Report Status 01/29/2019 FINAL  Final  Radiology Studies: No results found.      Scheduled Meds: . amLODipine  5 mg Oral Daily  . Chlorhexidine Gluconate Cloth  6 each Topical Daily  . DULoxetine  60 mg Oral Daily  . furosemide  80 mg Intravenous Once  . heparin injection (subcutaneous)  5,000 Units Subcutaneous Q8H  . HYDROmorphone   Intravenous Q4H  . insulin aspart  0-5 Units Subcutaneous QHS  . insulin aspart  0-9 Units Subcutaneous TID WC  . insulin glargine  20 Units Subcutaneous Q2200  . mouth rinse  15 mL Mouth Rinse BID  . metoprolol succinate  100 mg Oral Daily  . pantoprazole  40 mg Oral Daily  . predniSONE  10 mg Oral Q breakfast  . simethicone  80 mg Oral QID  . sodium chloride flush  10-40 mL Intracatheter Q12H  . thiamine  100 mg Oral Daily  . traZODone  300 mg Oral QHS   Continuous Infusions: . sodium chloride 50 mL/hr at 02/04/19 0300  . ampicillin-sulbactam (UNASYN) IV 3 g (02/04/19 [id number])          Aline August, MD [facility] 02/04/2019, 8:08 AM

## 2019-02-05 LAB — CBC WITH DIFFERENTIAL/PLATELET
Abs Immature Granulocytes: 0.26 10*3/uL — ABNORMAL HIGH (ref 0.00–0.07)
Basophils Absolute: 0.1 10*3/uL (ref 0.0–0.1)
Basophils Relative: 1 %
Eosinophils Absolute: 0.5 10*3/uL (ref 0.0–0.5)
Eosinophils Relative: 3 %
HCT: 36.9 % — ABNORMAL LOW (ref 39.0–52.0)
Hemoglobin: 11.2 g/dL — ABNORMAL LOW (ref 13.0–17.0)
Immature Granulocytes: 2 %
Lymphocytes Relative: 14 %
Lymphs Abs: 2 10*3/uL (ref 0.7–4.0)
MCH: 28.5 pg (ref 26.0–34.0)
MCHC: 30.4 g/dL (ref 30.0–36.0)
MCV: 93.9 fL (ref 80.0–100.0)
Monocytes Absolute: 0.9 10*3/uL (ref 0.1–1.0)
Monocytes Relative: 6 %
Neutro Abs: 11.2 10*3/uL — ABNORMAL HIGH (ref 1.7–7.7)
Neutrophils Relative %: 74 %
Platelets: 407 10*3/uL — ABNORMAL HIGH (ref 150–400)
RBC: 3.93 MIL/uL — ABNORMAL LOW (ref 4.22–5.81)
RDW: 16.1 % — ABNORMAL HIGH (ref 11.5–15.5)
WBC: 14.9 10*3/uL — ABNORMAL HIGH (ref 4.0–10.5)
nRBC: 0 % (ref 0.0–0.2)

## 2019-02-05 LAB — COMPREHENSIVE METABOLIC PANEL
ALT: 15 U/L (ref 0–44)
AST: 20 U/L (ref 15–41)
Albumin: 2.5 g/dL — ABNORMAL LOW (ref 3.5–5.0)
Alkaline Phosphatase: 69 U/L (ref 38–126)
Anion gap: 10 (ref 5–15)
BUN: 53 mg/dL — ABNORMAL HIGH (ref 8–23)
CO2: 23 mmol/L (ref 22–32)
Calcium: 8.9 mg/dL (ref 8.9–10.3)
Chloride: 107 mmol/L (ref 98–111)
Creatinine, Ser: 3.23 mg/dL — ABNORMAL HIGH (ref 0.61–1.24)
GFR calc Af Amer: 22 mL/min — ABNORMAL LOW (ref >60)
GFR calc non Af Amer: 19 mL/min — ABNORMAL LOW (ref >60)
Glucose, Bld: 155 mg/dL — ABNORMAL HIGH (ref 70–99)
Potassium: 3.9 mmol/L (ref 3.5–5.1)
Sodium: 140 mmol/L (ref 135–145)
Total Bilirubin: 0.8 mg/dL (ref 0.3–1.2)
Total Protein: 6.8 g/dL (ref 6.5–8.1)

## 2019-02-05 LAB — GLUCOSE, CAPILLARY
Glucose-Capillary: 179 mg/dL — ABNORMAL HIGH (ref 70–99)
Glucose-Capillary: 215 mg/dL — ABNORMAL HIGH (ref 70–99)
Glucose-Capillary: 187 mg/dL — ABNORMAL HIGH (ref 70–99)
Glucose-Capillary: 147 mg/dL — ABNORMAL HIGH (ref 70–99)

## 2019-02-05 LAB — MAGNESIUM: Magnesium: 1.8 mg/dL (ref 1.7–2.4)

## 2019-02-05 MED ORDER — POTASSIUM CHLORIDE 20 MEQ PO PACK
20.0000 meq | PACK | Freq: Once | ORAL | Status: AC
  Administered 2019-02-05: 20 meq via ORAL
  Filled 2019-02-05: qty 1

## 2019-02-05 MED ORDER — FUROSEMIDE 10 MG/ML IJ SOLN
40.0000 mg | Freq: Once | INTRAMUSCULAR | Status: AC
  Administered 2019-02-05: 40 mg via INTRAVENOUS
  Filled 2019-02-05: qty 4

## 2019-02-05 NOTE — Progress Notes (Signed)
Patient ID: Phillip Maldonado, male   DOB: [birth year], 68 y.o.   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 fib, chronic abdominal pain, recent treatment for upper GI bleeding presented with acute abdominal pain.  He was admitted for gallstone pancreatitis.  He was noted to have septic shock on 01/21/2019 and was transferred to ICU.  He was eventually weaned off of pressors and transferred back to [facility] service.  MRCP showed acute pancreatitis, cholelithiasis, no CBD obstruction.  General surgery was consulted.  He was also found to have worsening creatinine.  Nephrology was consulted.  He needed temporary hemodialysis.  He underwent cholecystectomy on 02/02/2019.  Assessment & Plan:   Sepsis/septic shock: Present on admission Acute cholecystitis Acute gallstone pancreatitis -Initially required ICU care and vasopressors.  After blood pressure improved, he was transferred to the floor under [facility] care. -MRCP showed mild peripancreatic fluid along pancreatic head/uncinate process related to acute pancreatitis.  No fluid collection or pseudocyst.  Cholelithiasis -HIDA scan was ordered: Patient refused -GI was consulted: Recommended outpatient follow-up with Dr. Carlean Purl in 2 to 4 weeks -Was on Rocephin and Flagyl: Currently on Unasyn -Blood cultures on 01/21/2019: Anaerobic bottle only with gram-negative rods: Bacteroides vulgatus -Status post laparoscopic converted to open cholecystectomy on 02/02/2019.  Diet/pain management/wound care/duration of antibiotic treatment as per general surgery recommendations. -PT recommends home health PT. -Currently afebrile and hemodynamically stable.  Leukocytosis -Monitor.  Acute kidney injury on chronic kidney disease stage III -developed  AKI in the setting of septic shock.  Creatinine peaked to 8.26 -Renal ultrasound showed no obstruction.  Urine culture showed no growth.   -Nephrology following : Status post HD x2, last HD was on 01/27/2019.  Currently HD on hold.  Follow urine output. -Creatinine 3.23 today.  No indication for hemodialysis currently.  Acute respiratory failure with hypoxia -Due to combination of COPD/heart failure/OSA -Not on oxygen at home -Currently on room air.  Incentive spirometry.  Essential hypertension -Blood pressure currently stable.  Continue amlodipine and metoprolol  Hyperlipidemia -Statin on hold due to elevated LFTs  Chronic diastolic CHF -Echo showed EF of 60 to 65% with grade 1 diastolic dysfunction -Nephrology following and managing volume.  Strict input and output and daily weights.  Diabetes mellitus type 2 uncontrolled with hyperglycemia -Continue Lantus.  Continue CBGs with SSI.  Left knee pain --Patient was supposed to have some type of procedure few months ago however this was delayed as his wife had Covid. -Orthopedics consulted and appreciated, status post left knee aspiration and injection (under 90 mL of clear fluid was obtained, 6 mL of 0.5% Marcaine and 80 mg of Depo-Medrol injected)  GERD/history of GI bleed -Continue PPI  History of paroxysmal A. Fib -Currently rate controlled and in sinus rhythm.  Not on anticoagulation as an outpatient.  Continue metoprolol.  Morbid obesity  -outpatient follow-up  Generalized deconditioning -PT recommends home health PT   DVT prophylaxis: Heparin Code Status: Full Family Communication: Spoke to patient at bedside Disposition Plan: Home with home health PT once cleared by general surgery Consultants: PCCM/general surgery/nephrology/gastroenterology's orthopedic surgery  Procedures:  Echo IMPRESSIONS    1. Left ventricular ejection fraction, by visual estimation, is 60 to 65%. The left ventricle has normal  function. There is no left ventricular hypertrophy.  2. Left ventricular diastolic parameters are consistent with Grade I diastolic dysfunction (impaired relaxation).  3. Global right ventricle has normal systolic function.The right ventricular size is normal. No increase in right ventricular wall thickness.  4. Left atrial size was mildly dilated.  5. Right atrial size was normal.  6. Moderate mitral annular calcification.  7. The mitral valve is normal in structure. No evidence of mitral valve regurgitation. No evidence of mitral stenosis.  8. The tricuspid valve is normal in structure. Tricuspid valve regurgitation is not demonstrated.  9. The aortic valve is tricuspid. Aortic valve regurgitation is not visualized. Mild aortic valve sclerosis without stenosis. 10. The pulmonic valve was normal in structure. Pulmonic valve regurgitation is not visualized. 11. The inferior vena cava is normal in size with greater than 50% respiratory variability, suggesting right atrial pressure of 3 mmHg. 12. The interatrial septum was not well visualized.  Left knee aspiration and injection  Insertion of right IJ nontunneled HD cath  laparoscopic converted to open cholecystectomy on 02/02/2019   Antimicrobials:  Anti-infectives (From admission, onward)   Start     Dose/Rate Route Frequency Ordered Stop   01/31/19 2200  Ampicillin-Sulbactam (UNASYN) 3 g in sodium chloride 0.9 % 100 mL IVPB     3 g 200 mL/hr over 30 Minutes Intravenous Every 12 hours 01/31/19 1051     01/31/19 1000  Ampicillin-Sulbactam (UNASYN) 3 g in sodium chloride 0.9 % 100 mL IVPB  Status:  Discontinued     3 g 200 mL/hr over 30 Minutes Intravenous Every 24 hours 01/31/19 0859 01/31/19 1051   01/23/19 1400  piperacillin-tazobactam (ZOSYN) IVPB 2.25 g  Status:  Discontinued     2.25 g 100 mL/hr over 30 Minutes Intravenous Every 8 hours 01/23/19 0717 01/23/19 0947   01/23/19 1400  cefTRIAXone (ROCEPHIN) 2 g in sodium chloride 0.9  % 100 mL IVPB  Status:  Discontinued     2 g 200 mL/hr over 30 Minutes Intravenous Every 24 hours 01/23/19 0947 01/31/19 0857   01/23/19 1400  metroNIDAZOLE (FLAGYL) IVPB 500 mg  Status:  Discontinued     500 mg 100 mL/hr over 60 Minutes Intravenous Every 8 hours 01/23/19 0947 01/31/19 0857   01/21/19 1400  piperacillin-tazobactam (ZOSYN) IVPB 3.375 g  Status:  Discontinued     3.375 g 12.5 mL/hr over 240 Minutes Intravenous Every 8 hours 01/21/19 1325 01/23/19 0717      Subjective: Patient seen and examined at bedside.  Denies worsening shortness of breath, fever, worsening abdominal pain.  Objective: Vitals:   02/05/19 0003 02/05/19 0414 02/05/19 0531 02/05/19 0738  BP: (!) 141/69 116/74    Pulse: 72 83    Resp: 18 18 16 16   Temp: (!) 97.4 F (36.3 C) 97.7 F (36.5 C)    TempSrc: Oral Oral    SpO2: 94% 95% 95% 96%  Weight:   (!) 153.4 kg   Height:        Intake/Output Summary (Last 24 hours) at 02/05/2019 0749 Last data filed at 02/05/2019 0400 Gross per 24 hour  Intake -  Output 2650 ml  Net -2650 ml   Filed Weights   02/04/19 0349 02/04/19 0651 02/05/19 0531  Weight: (!) 158.8 kg (!) 156.3 kg (!) 153.4 kg    Examination:  General exam: No acute distress.  Looks chronically ill.   Respiratory system: Bilateral decreased breath sounds mostly at the bases with scattered rales, no wheezing  cardiovascular system: S1-S2 heard, rate controlled Gastrointestinal system:  Abdomen is nondistended, soft and dressing present with JP drain and surrounding tenderness. Normal bowel sounds heard. Extremities: No cyanosis; trace bilateral lower extremity edema.  Right upper extremity edema present  Data Reviewed: I have personally reviewed following labs and imaging studies  CBC: Recent Labs  Lab 02/02/19 0444 02/02/19 2206 02/03/19 0353 02/04/19 0254 02/05/19 0455  WBC 18.9* 27.7* 19.6* 14.2* 14.9*  NEUTROABS 14.8*  --  16.3* 11.0* 11.2*  HGB 12.8* 12.3* 11.2* 10.6*  11.2*  HCT 40.8 39.4 35.6* 33.6* 36.9*  MCV 89.5 91.8 91.3 90.3 93.9  PLT 353 449* 324 295 [id number]   Basic Metabolic Panel: Recent Labs  Lab 01/30/19 0306 01/31/19 0350 02/01/19 0450 02/02/19 0444 02/03/19 0353 02/04/19 0254 02/05/19 0455  NA 136 137 139 139 139 139 140  K 4.1 4.0 3.8 3.6 4.0 3.7 3.9  CL 99 101 101 102 103 107 107  CO2 19* 19* 19* 23 22 20* 23  GLUCOSE 211* 215* 164* 158* 185* 155* 155*  BUN 93* 104* 110* 64* 74* 65* 53*  CREATININE 7.57* 7.30* 7.01* 4.74* 4.69* 3.87* 3.23*  CALCIUM 8.5* 8.5* 8.6* 8.3* 8.6* 8.4* 8.9  MG  --   --   --  2.0  --  2.0 1.8  PHOS 8.8* 9.5* 9.6*  --   --   --   --    GFR: Estimated Creatinine Clearance: 32.6 mL/min (A) (by C-G formula based on SCr of 3.23 mg/dL (H)). Liver Function Tests: Recent Labs  Lab 02/01/19 0450 02/02/19 0444 02/03/19 0353 02/04/19 0254 02/05/19 0455  AST  --  14* 31 23 20   ALT  --  12 18 15 15   ALKPHOS  --  81 69 68 69  BILITOT  --  0.7 0.8 0.9 0.8  PROT  --  6.6 6.5 6.2* 6.8  ALBUMIN 2.3* 2.4* 2.5* 2.3* 2.5*   No results for input(s): LIPASE, AMYLASE in the last 168 hours. No results for input(s): AMMONIA in the last 168 hours. Coagulation Profile: No results for input(s): INR, PROTIME in the last 168 hours. Cardiac Enzymes: No results for input(s): CKTOTAL, CKMB, CKMBINDEX, TROPONINI in the last 168 hours. BNP (last 3 results) No results for input(s): PROBNP in the last 8760 hours. HbA1C: No results for input(s): HGBA1C in the last 72 hours. CBG: Recent Labs  Lab 02/03/19 2120 02/04/19 0802 02/04/19 1151 02/04/19 1722 02/04/19 2117  GLUCAP 129* 108* 162* 207* 143*   Lipid Profile: No results for input(s): CHOL, HDL, LDLCALC, TRIG, CHOLHDL, LDLDIRECT in the last 72 hours. Thyroid Function Tests: No results for input(s): TSH, T4TOTAL, FREET4, T3FREE, THYROIDAB in the last 72 hours. Anemia Panel: No results for input(s): VITAMINB12, FOLATE, FERRITIN, TIBC, IRON, RETICCTPCT in the last  72 hours. Sepsis Labs: No results for input(s): PROCALCITON, LATICACIDVEN in the last 168 hours.  Recent Results (from the past 240 hour(s))  Body fluid culture     Status: None   Collection Time: 01/26/19 12:43 PM   Specimen: Body Fluid  Result Value Ref Range Status   Specimen Description FLUID  Final   Special Requests SYNOVIAL KNEE  Final   Gram Stain   Final    WBC PRESENT,BOTH PMN AND MONONUCLEAR NO ORGANISMS SEEN CYTOSPIN SMEAR    Culture   Final    NO GROWTH 3 DAYS Performed at [facility], [address], [place], [place] [address]    Report Status 01/29/2019 FINAL  Final  Radiology Studies: No results found.      Scheduled Meds: . amLODipine  5 mg Oral Daily  . Chlorhexidine Gluconate Cloth  6 each Topical Daily  . DULoxetine  60 mg Oral Daily  . heparin injection (subcutaneous)  5,000 Units Subcutaneous Q8H  . HYDROmorphone   Intravenous Q4H  . insulin aspart  0-5 Units Subcutaneous QHS  . insulin aspart  0-9 Units Subcutaneous TID WC  . insulin glargine  20 Units Subcutaneous Q2200  . mouth rinse  15 mL Mouth Rinse BID  . metoprolol succinate  100 mg Oral Daily  . pantoprazole  40 mg Oral Daily  . predniSONE  10 mg Oral Q breakfast  . simethicone  80 mg Oral QID  . sodium chloride flush  10-40 mL Intracatheter Q12H  . thiamine  100 mg Oral Daily  . traZODone  300 mg Oral QHS   Continuous Infusions: . ampicillin-sulbactam (UNASYN) IV 3 g (02/05/19 0528)          Aline August, MD [facility] 02/05/2019, 7:49 AM

## 2019-02-05 NOTE — Progress Notes (Signed)
Pt's PCA order discontinued. RN and Erin RN wasted 6 ml of dilaudid in stericycle.

## 2019-02-05 NOTE — Progress Notes (Signed)
3 Days Post-Op   Subjective/Chief Complaint: Pt with min pain. tol PO    Objective: Vital signs in last 24 hours: Temp:  [97.4 F (36.3 C)-97.8 F (36.6 C)] 97.7 F (36.5 C) (11/22 0414) Pulse Rate:  [68-83] 83 (11/22 0414) Resp:  [16-20] 16 (11/22 0738) BP: (116-149)/(69-79) 116/74 (11/22 0414) SpO2:  [94 %-98 %] 96 % (11/22 0738) Weight:  [153.4 kg] 153.4 kg (11/22 0531) Last BM Date: 02/03/19  Intake/Output from previous day: 11/21 0701 - 11/22 0700 In: -  Out: 2650 [Urine:2400; Drains:250] Dr output down from 358cc Intake/Output this shift: No intake/output data recorded.  Constitutional: No acute distress, conversant, appears states age. Eyes: Anicteric sclerae, moist conjunctiva, no lid lag Lungs: Clear to auscultation bilaterally, normal respiratory effort CV: regular rate and rhythm, no murmurs, no peripheral edema, pedal pulses 2+ GI: Soft, no masses or hepatosplenomegaly, non-tender to palpation, JP bilious Skin: No rashes, palpation reveals normal turgor Psychiatric: appropriate judgment and insight, oriented to person, place, and time   Lab Results:  Recent Labs    02/04/19 0254 02/05/19 0455  WBC 14.2* 14.9*  HGB 10.6* 11.2*  HCT 33.6* 36.9*  PLT 295 407*   BMET Recent Labs    02/04/19 0254 02/05/19 0455  NA 139 140  K 3.7 3.9  CL 107 107  CO2 20* 23  GLUCOSE 155* 155*  BUN 65* 53*  CREATININE 3.87* 3.23*  CALCIUM 8.4* 8.9   Anti-infectives: Anti-infectives (From admission, onward)   Start     Dose/Rate Route Frequency Ordered Stop   01/31/19 2200  Ampicillin-Sulbactam (UNASYN) 3 g in sodium chloride 0.9 % 100 mL IVPB     3 g 200 mL/hr over 30 Minutes Intravenous Every 12 hours 01/31/19 1051     01/31/19 1000  Ampicillin-Sulbactam (UNASYN) 3 g in sodium chloride 0.9 % 100 mL IVPB  Status:  Discontinued     3 g 200 mL/hr over 30 Minutes Intravenous Every 24 hours 01/31/19 0859 01/31/19 1051   01/23/19 1400  piperacillin-tazobactam  (ZOSYN) IVPB 2.25 g  Status:  Discontinued     2.25 g 100 mL/hr over 30 Minutes Intravenous Every 8 hours 01/23/19 0717 01/23/19 0947   01/23/19 1400  cefTRIAXone (ROCEPHIN) 2 g in sodium chloride 0.9 % 100 mL IVPB  Status:  Discontinued     2 g 200 mL/hr over 30 Minutes Intravenous Every 24 hours 01/23/19 0947 01/31/19 0857   01/23/19 1400  metroNIDAZOLE (FLAGYL) IVPB 500 mg  Status:  Discontinued     500 mg 100 mL/hr over 60 Minutes Intravenous Every 8 hours 01/23/19 0947 01/31/19 0857   01/21/19 1400  piperacillin-tazobactam (ZOSYN) IVPB 3.375 g  Status:  Discontinued     3.375 g 12.5 mL/hr over 240 Minutes Intravenous Every 8 hours 01/21/19 1325 01/23/19 0717      Assessment/Plan: s/p Procedure(s): attempted LAPAROSCOPIC CHOLECYSTECTOMY Cholecystectomy 02/02/2019  Pancreatitis, abdominal pain, cholelithiasis and jaundice -S/P laparoscopic converted to open cholecystectomy, Dr. Ninfa Linden, 11/19 - D/C PCA,  encourage oral medications, ambulate, IS  FEN: carb mod diet [id number] 11/7-11/9; Rocephin/Flagyl 11/9-11/17; Unasyn 11/17>> [id number] Follow up: TBD   LOS: 16 days    Ralene Ok 02/05/2019

## 2019-02-05 NOTE — Progress Notes (Signed)
[facility] Progress Note    Assessment/ Plan:   1. Acute renal failure - combination of IV contrast/ hypotension-shock w/  ARB/ACEi on board. Creat worsened and is now sp HD x 2, last Friday 11/13. Evidence of incipient recovery and is making good UOP on Lasix- however BUN up and he has some sleepiness/ abd pain/ bloating--> got last session of HD 11/18, will continue to watch for signs of recovery.  Robust UOP off Lasix but decreased a little yesterday to 2.6L, will remove HD cath today and give a little Lasix 40 IV.    1. Septic shock/ abd pain - resolved, suspected gallstone pancreatitis, surgery consulted--> had ccy 11/19, on Unasyn  2. Chronic pain  3. Depression  4. Chronic CHF-- some volume overload which is improved    Subjective:    2.6L yesterday, weights down.  Cr down.  No need for further dialysis.     Objective:   BP 116/74 (BP Location: Left Arm)   Pulse 83   Temp 97.7 F (36.5 C) (Oral)   Resp 15   Ht 5\' 9"  (1.753 m)   Wt (!) 153.4 kg   SpO2 92%   BMI 49.94 kg/m   Intake/Output Summary (Last 24 hours) at 02/05/2019 0930 Last data filed at 02/05/2019 0400 Gross per 24 hour  Intake -  Output 2650 ml  Net -2650 ml   Weight change: -5.4 kg  Physical Exam: Gen: NAD, sitting up in chair CVS: RRR no m/r/g Resp: clear bilaterally no c/w/r Abd: soft, lap ports and open ccy incision c/d/i Ext: 1+ anasarca, improved, can see skin texture of legs now along with hands Dialysis Access: R IJ nontunneled HD cath  Imaging: No results found.  Labs: BMET Recent Labs  Lab 01/30/19 0306 01/31/19 0350 02/01/19 0450 02/02/19 0444 02/03/19 0353 02/04/19 0254 02/05/19 0455  NA 136 137 139 139 139 139 140  K 4.1 4.0 3.8 3.6 4.0 3.7 3.9  CL 99 101 101 102 103 107 107  CO2 19* 19* 19* 23 22 20* 23  GLUCOSE 211* 215* 164* 158* 185* 155* 155*  BUN 93* 104* 110* 64* 74* 65* 53*  CREATININE 7.57* 7.30* 7.01* 4.74* 4.69* 3.87* 3.23*  CALCIUM  8.5* 8.5* 8.6* 8.3* 8.6* 8.4* 8.9  PHOS 8.8* 9.5* 9.6*  --   --   --   --    CBC Recent Labs  Lab 02/02/19 0444 02/02/19 2206 02/03/19 0353 02/04/19 0254 02/05/19 0455  WBC 18.9* 27.7* 19.6* 14.2* 14.9*  NEUTROABS 14.8*  --  16.3* 11.0* 11.2*  HGB 12.8* 12.3* 11.2* 10.6* 11.2*  HCT 40.8 39.4 35.6* 33.6* 36.9*  MCV 89.5 91.8 91.3 90.3 93.9  PLT 353 449* 324 295 407*    Medications:    . amLODipine  5 mg Oral Daily  . Chlorhexidine Gluconate Cloth  6 each Topical Daily  . DULoxetine  60 mg Oral Daily  . heparin injection (subcutaneous)  5,000 Units Subcutaneous Q8H  . insulin aspart  0-5 Units Subcutaneous QHS  . insulin aspart  0-9 Units Subcutaneous TID WC  . insulin glargine  20 Units Subcutaneous Q2200  . mouth rinse  15 mL Mouth Rinse BID  . metoprolol succinate  100 mg Oral Daily  . pantoprazole  40 mg Oral Daily  . predniSONE  10 mg Oral Q breakfast  . simethicone  80 mg Oral QID  . sodium chloride flush  10-40 mL Intracatheter Q12H  . thiamine  100 mg Oral  Daily  . traZODone  300 mg Oral QHS      Madelon Lips MD 02/05/2019, 9:30 AM

## 2019-02-06 DIAGNOSIS — K838 Other specified diseases of biliary tract: Secondary | ICD-10-CM

## 2019-02-06 DIAGNOSIS — K9189 Other postprocedural complications and disorders of digestive system: Secondary | ICD-10-CM

## 2019-02-06 DIAGNOSIS — R1011 Right upper quadrant pain: Secondary | ICD-10-CM

## 2019-02-06 LAB — CBC WITH DIFFERENTIAL/PLATELET
Abs Immature Granulocytes: 0.17 10*3/uL — ABNORMAL HIGH (ref 0.00–0.07)
Basophils Absolute: 0.1 10*3/uL (ref 0.0–0.1)
Basophils Relative: 0 %
Eosinophils Absolute: 0.2 10*3/uL (ref 0.0–0.5)
Eosinophils Relative: 2 %
HCT: 32 % — ABNORMAL LOW (ref 39.0–52.0)
Hemoglobin: 10.1 g/dL — ABNORMAL LOW (ref 13.0–17.0)
Immature Granulocytes: 1 %
Lymphocytes Relative: 12 %
Lymphs Abs: 1.8 10*3/uL (ref 0.7–4.0)
MCH: 29.1 pg (ref 26.0–34.0)
MCHC: 31.6 g/dL (ref 30.0–36.0)
MCV: 92.2 fL (ref 80.0–100.0)
Monocytes Absolute: 1 10*3/uL (ref 0.1–1.0)
Monocytes Relative: 7 %
Neutro Abs: 11.8 10*3/uL — ABNORMAL HIGH (ref 1.7–7.7)
Neutrophils Relative %: 78 %
Platelets: 345 10*3/uL (ref 150–400)
RBC: 3.47 MIL/uL — ABNORMAL LOW (ref 4.22–5.81)
RDW: 16 % — ABNORMAL HIGH (ref 11.5–15.5)
WBC: 15 10*3/uL — ABNORMAL HIGH (ref 4.0–10.5)
nRBC: 0 % (ref 0.0–0.2)

## 2019-02-06 LAB — COMPREHENSIVE METABOLIC PANEL
ALT: 23 U/L (ref 0–44)
AST: 35 U/L (ref 15–41)
Albumin: 2.4 g/dL — ABNORMAL LOW (ref 3.5–5.0)
Alkaline Phosphatase: 86 U/L (ref 38–126)
Anion gap: 11 (ref 5–15)
BUN: 48 mg/dL — ABNORMAL HIGH (ref 8–23)
CO2: 21 mmol/L — ABNORMAL LOW (ref 22–32)
Calcium: 8.8 mg/dL — ABNORMAL LOW (ref 8.9–10.3)
Chloride: 107 mmol/L (ref 98–111)
Creatinine, Ser: 2.91 mg/dL — ABNORMAL HIGH (ref 0.61–1.24)
GFR calc Af Amer: 25 mL/min — ABNORMAL LOW (ref >60)
GFR calc non Af Amer: 21 mL/min — ABNORMAL LOW (ref >60)
Glucose, Bld: 156 mg/dL — ABNORMAL HIGH (ref 70–99)
Potassium: 3.7 mmol/L (ref 3.5–5.1)
Sodium: 139 mmol/L (ref 135–145)
Total Bilirubin: 1 mg/dL (ref 0.3–1.2)
Total Protein: 6.5 g/dL (ref 6.5–8.1)

## 2019-02-06 LAB — GLUCOSE, CAPILLARY
Glucose-Capillary: 144 mg/dL — ABNORMAL HIGH (ref 70–99)
Glucose-Capillary: 140 mg/dL — ABNORMAL HIGH (ref 70–99)
Glucose-Capillary: 207 mg/dL — ABNORMAL HIGH (ref 70–99)
Glucose-Capillary: 137 mg/dL — ABNORMAL HIGH (ref 70–99)

## 2019-02-06 LAB — MAGNESIUM: Magnesium: 1.7 mg/dL (ref 1.7–2.4)

## 2019-02-06 LAB — SURGICAL PATHOLOGY

## 2019-02-06 MED ORDER — SODIUM CHLORIDE 0.9% FLUSH
10.0000 mL | Freq: Two times a day (BID) | INTRAVENOUS | Status: DC
  Administered 2019-02-06 – 2019-02-07 (×2): 10 mL

## 2019-02-06 MED ORDER — SODIUM CHLORIDE 0.9% FLUSH: 10.0000 mL | INTRAVENOUS | Status: DC | PRN

## 2019-02-06 NOTE — TOC Progression Note (Addendum)
Transition of Care [facility]) - Progression Note    Patient Details  Name: Phillip Maldonado MRN: [id number] Date of Birth: March 13, 1951  Transition of Care [facility]) CM/SW [place], LCSW Phone Number: 02/06/2019, 12:36 PM  Clinical Narrative:    CSW received consult for possible home health services at time of discharge. CSW spoke with patient regarding PT recommendation of Home Health PT at time of discharge. Patient appeared very lethargic and requested CSW contact his wife. CSW spoke with patient's wife. She reported that she and the patient were recently married in June and her daughter recently passed away. She stated that patient would like home health services with [facility] as long as patient is able to progress and move with a walker. He has a walker and cane at home. CSW sent referral for review (PT/OT/RN/aide/SW) and patient has been accepted by [facility]. CSW provided Medicare [place] ratings list. Patient's spouse is checking to see who his PCP is as it is not listed. No further questions reported at this time. CSW to continue to follow and assist with discharge planning needs.    Expected Discharge Plan: [place] Barriers to Discharge: Continued Medical Work up  Expected Discharge Plan and Services Expected Discharge Plan: [place] In-house Referral: NA Discharge Planning Services: CM Consult Post Acute Care Choice: [place] arrangements for the past 2 months: Single Family Home                           HH Arranged: RN, PT, OT, Nurse's Aide, Social Work [employer] Agency: [facility] [employer] [facility] [employer] Date [place]: 02/06/19 Time [place]: 1234 Representative spoke with at [place]: [place] ([place]) Interventions    Readmission Risk Interventions Readmission Risk Prevention Plan 02/06/2019  Transportation Screening Complete  PCP or Specialist  Appt within 5-7 Days Complete  Home Care Screening Complete  Medication Review (RN CM) Complete  Some recent data might be hidden

## 2019-02-06 NOTE — Progress Notes (Signed)
[facility] Progress Note    Assessment/ Plan:   1. Acute renal failure - combination of IV contrast/ hypotension-shock w/  ARB/ACEi on board. Creat worsened and s/p Evidence of incipient recovery and is making good UOP on Lasix- however BUN up and he had some sleepiness/ abd pain/ bloating--> got last session of HD 11/18 - HD catheter is out.  S/p lasix IV on 11/22  - Continue supportive care     1. Septic shock/ abd pain - resolved, suspected gallstone pancreatitis, surgery consulted--> had ccy 11/19, on Unasyn  2. Chronic pain - per primary team   3. Depression- per primary team   4. Chronic diastolic CHF-- volume overload is improved   5. Normocytic anemia - no acute indication for PRBC's  Subjective:    Had 2.8 liters UOP over 11/22.  Received lasix IV on 11/22 x 1. Dialysis catheter removed yesterday  Review of systems:  Denies shortness of breath  Denies chest pain  Denies n/v but reports abd pain   Objective:   BP (!) 148/86 (BP Location: Right Arm)   Pulse 81   Temp 98.2 F (36.8 C) (Oral)   Resp 16   Ht 5\' 9"  (1.753 m)   Wt (!) 153 kg   SpO2 95%   BMI 49.81 kg/m   Intake/Output Summary (Last 24 hours) at 02/06/2019 1018 Last data filed at 02/06/2019 [id number] Gross per 24 hour  Intake 933.14 ml  Output 2815 ml  Net -1881.86 ml   Weight change: -0.4 kg  Physical Exam: Gen: adult male in bed in NAD at rest CVS: RRR no m/r/g Resp: clear bilaterally no c/w/r Abd: soft, lap ports and open ccy incision c/d/i; tenderness RUQ Ext: 1+ edema Dialysis Access: has been removed   Imaging: No results found.  Labs: BMET Recent Labs  Lab 01/31/19 0350 02/01/19 0450 02/02/19 0444 02/03/19 0353 02/04/19 0254 02/05/19 0455 02/06/19 0717  NA 137 139 139 139 139 140 139  K 4.0 3.8 3.6 4.0 3.7 3.9 3.7  CL 101 101 102 103 107 107 107  CO2 19* 19* 23 22 20* 23 21*  GLUCOSE 215* 164* 158* 185* 155* 155* 156*  BUN 104* 110* 64* 74* 65* 53* 48*   CREATININE 7.30* 7.01* 4.74* 4.69* 3.87* 3.23* 2.91*  CALCIUM 8.5* 8.6* 8.3* 8.6* 8.4* 8.9 8.8*  PHOS 9.5* 9.6*  --   --   --   --   --    CBC Recent Labs  Lab 02/03/19 0353 02/04/19 0254 02/05/19 0455 02/06/19 0717  WBC 19.6* 14.2* 14.9* 15.0*  NEUTROABS 16.3* 11.0* 11.2* 11.8*  HGB 11.2* 10.6* 11.2* 10.1*  HCT 35.6* 33.6* 36.9* 32.0*  MCV 91.3 90.3 93.9 92.2  PLT 324 295 407* 345    Medications:    . amLODipine  5 mg Oral Daily  . Chlorhexidine Gluconate Cloth  6 each Topical Daily  . DULoxetine  60 mg Oral Daily  . heparin injection (subcutaneous)  5,000 Units Subcutaneous Q8H  . insulin aspart  0-5 Units Subcutaneous QHS  . insulin aspart  0-9 Units Subcutaneous TID WC  . insulin glargine  20 Units Subcutaneous Q2200  . mouth rinse  15 mL Mouth Rinse BID  . metoprolol succinate  100 mg Oral Daily  . pantoprazole  40 mg Oral Daily  . simethicone  80 mg Oral QID  . sodium chloride flush  10-40 mL Intracatheter Q12H  . sodium chloride flush  10-40 mL Intracatheter Q12H  .  thiamine  100 mg Oral Daily  . traZODone  300 mg Oral QHS    Claudia Desanctis 02/06/2019, 10:18 AM

## 2019-02-06 NOTE — Progress Notes (Signed)
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 fib, chronic abdominal pain, recent treatment for upper GI bleeding presented with acute abdominal pain.  He was admitted for gallstone pancreatitis.  He was noted to have septic shock on 01/21/2019 and was transferred to ICU.  He was eventually weaned off of pressors and transferred back to [facility] service.  MRCP showed acute pancreatitis, cholelithiasis, no CBD obstruction.  General surgery was consulted.  He was also found to have worsening creatinine.  Nephrology was consulted.  He needed temporary hemodialysis.  He underwent cholecystectomy on 02/02/2019.    PT Comments    Patient seen for mobility progression.  Patient in bed upon arrival, appears drowsy, and c/o abdominal pain. Pt initially declined participating but then agreeable to attempt OOB transfer. Pt requires min A for bed mobility, functional transfers, and gait distance of 6 ft using RW. Pt up in recliner end of session wrapped in heated blankets as he was cold and shivering throughout session. Pt verbalized very little and with soft voice. Vitals take by NT upon arrival and VSS prior to mobilizing. Continue to progress as tolerated.    Follow Up Recommendations  Home health PT;Supervision for mobility/OOB     Equipment Recommendations  None recommended by PT    Recommendations for Other Services       Precautions / Restrictions Precautions Precaution Comments: JP drain, abdominal incision, limited ROM R shoulder, B carpal tunnel Restrictions Weight Bearing Restrictions: No    Mobility  Bed Mobility Overal bed mobility: Needs Assistance Bed Mobility: Supine to Sit     Supine to sit: Min assist;HOB elevated     General bed mobility comments:  use of rail; assist to elevate trunk into sitting  Transfers Overall transfer level: Needs assistance Equipment used: Rolling walker (2 wheeled) Transfers: Sit to/from Stand Sit to Stand: Min assist         General transfer comment: cues for hand placement; assist to power up from EOB and BSC  Ambulation/Gait Ambulation/Gait assistance: Min guard Gait Distance (Feet): 6 Feet Assistive device: Rolling walker (2 wheeled) Gait Pattern/deviations: Trunk flexed;Step-through pattern;Decreased stride length Gait velocity: decreased   General Gait Details: cues for posture; min guard for safety; distance limited by pain and fatigue   Stairs             Wheelchair Mobility    Modified Rankin (Stroke Patients Only)       Balance Overall balance assessment: Needs assistance Sitting-balance support: Single extremity supported;Feet supported Sitting balance-Leahy Scale: Fair     Standing balance support: Bilateral upper extremity supported;During functional activity Standing balance-Leahy Scale: Poor                              Cognition Arousal/Alertness: Awake/alert Behavior During Therapy: Flat affect Overall Cognitive Status: Within Functional Limits for tasks assessed                                 General Comments: pt verbalized very little during session and with soft voice      Exercises      General Comments General comments (skin integrity, edema, etc.): HR and SpO2 WNL on RA  Pertinent Vitals/Pain Pain Assessment: Faces Faces Pain Scale: Hurts even more Pain Location: abdomen Pain Descriptors / Indicators: Discomfort;Guarding Pain Intervention(s): Limited activity within patient's tolerance;Monitored during session;Repositioned    Home Living                      Prior Function            PT Goals (current goals can now be found in the care plan section) Progress towards PT goals: Progressing toward  goals    Frequency    Min 3X/week      PT Plan Current plan remains appropriate    Co-evaluation              AM-PAC PT "6 Clicks" Mobility   Outcome Measure  Help needed turning from your back to your side while in a flat bed without using bedrails?: A Little Help needed moving from lying on your back to sitting on the side of a flat bed without using bedrails?: A Lot Help needed moving to and from a bed to a chair (including a wheelchair)?: A Little Help needed standing up from a chair using your arms (e.g., wheelchair or bedside chair)?: A Little Help needed to walk in hospital room?: A Lot Help needed climbing 3-5 steps with a railing? : Total 6 Click Score: 14    End of Session Equipment Utilized During Treatment: Gait belt Activity Tolerance: Patient limited by fatigue;Patient limited by pain Patient left: in chair;with call bell/phone within reach Nurse Communication: Mobility status PT Visit Diagnosis: Unsteadiness on feet (R26.81);Other abnormalities of gait and mobility (R26.89);Muscle weakness (generalized) (M62.81);Pain     Time: 1205-1238 PT Time Calculation (min) (ACUTE ONLY): 33 min  Charges:  $Gait Training: 8-22 mins $Therapeutic Activity: 8-22 mins                     Earney Navy, PTA Acute Rehabilitation Services Pager: [phone] Office: [phone]     Darliss Cheney 02/06/2019, 4:22 PM

## 2019-02-06 NOTE — Plan of Care (Signed)
  Problem: Nutrition: Goal: Adequate nutrition will be maintained Outcome: Progressing   Problem: Pain Managment: Goal: General experience of comfort will improve Outcome: Progressing   

## 2019-02-06 NOTE — Progress Notes (Signed)
[facility] Progress Note  4 Days Post-Op  Subjective: CC: abdominal pain Patient lethargic this AM but arousable and responsive. Reports he just feels tired. Having some generalized abdominal pain but worst pain is in RUQ. Denies nausea. Tolerating diet and having bowel function  Objective: Vital signs in last 24 hours: Temp:  [97.4 F (36.3 C)-98.5 F (36.9 C)] 98.2 F (36.8 C) (11/23 0520) Pulse Rate:  [73-81] 81 (11/23 0520) Resp:  [16-18] 16 (11/23 0520) BP: (138-148)/(77-86) 148/86 (11/23 0520) SpO2:  [95 %-98 %] 95 % (11/23 0520) Weight:  [153 kg] 153 kg (11/23 0520) Last BM Date: 02/05/19  Intake/Output from previous day: 11/22 0701 - 11/23 0700 In: 1413.1 [P.O.:960; IV Piggyback:323.1] Out: 2975 [Urine:2800; Drains:175] Intake/Output this shift: No intake/output data recorded.  PE: Gen:  Lethargic, arouses easily Card:  Regular rate and rhythm Pulm:  Normal effort, clear to auscultation bilaterally Abd: Soft, appropriately ttp, non-distended, +BS. Incisions c/d/i with staples present, drain in RUQ with frank bile  Skin: warm and dry, no rashes  Psych: A&Ox3   Lab Results:  Recent Labs    02/05/19 0455 02/06/19 0717  WBC 14.9* 15.0*  HGB 11.2* 10.1*  HCT 36.9* 32.0*  PLT 407* 345   BMET Recent Labs    02/04/19 0254 02/05/19 0455  NA 139 140  K 3.7 3.9  CL 107 107  CO2 20* 23  GLUCOSE 155* 155*  BUN 65* 53*  CREATININE 3.87* 3.23*  CALCIUM 8.4* 8.9   PT/INR No results for input(s): LABPROT, INR in the last 72 hours. CMP     Component Value Date/Time   NA 140 02/05/2019 0455   NA 139 02/12/2015 1424   K 3.9 02/05/2019 0455   CL 107 02/05/2019 0455   CO2 23 02/05/2019 0455   GLUCOSE 155 (H) 02/05/2019 0455   BUN 53 (H) 02/05/2019 0455   BUN 15 02/12/2015 1424   CREATININE 3.23 (H) 02/05/2019 0455   CREATININE 1.14 10/01/2016 1504   CALCIUM 8.9 02/05/2019 0455   PROT 6.8 02/05/2019 0455   PROT 6.7 10/11/2014 0833   ALBUMIN  2.5 (L) 02/05/2019 0455   ALBUMIN 3.8 10/11/2014 0833   AST 20 02/05/2019 0455   ALT 15 02/05/2019 0455   ALKPHOS 69 02/05/2019 0455   BILITOT 0.8 02/05/2019 0455   BILITOT 0.4 10/11/2014 0833   GFRNONAA 19 (L) 02/05/2019 0455   GFRNONAA 67 10/01/2016 1504   GFRAA 22 (L) 02/05/2019 0455   GFRAA 78 10/01/2016 1504   Lipase     Component Value Date/Time   LIPASE 38 01/24/2019 0150       Studies/Results: No results found.  Anti-infectives: Anti-infectives (From admission, onward)   Start     Dose/Rate Route Frequency Ordered Stop   01/31/19 2200  Ampicillin-Sulbactam (UNASYN) 3 g in sodium chloride 0.9 % 100 mL IVPB     3 g 200 mL/hr over 30 Minutes Intravenous Every 12 hours 01/31/19 1051     01/31/19 1000  Ampicillin-Sulbactam (UNASYN) 3 g in sodium chloride 0.9 % 100 mL IVPB  Status:  Discontinued     3 g 200 mL/hr over 30 Minutes Intravenous Every 24 hours 01/31/19 0859 01/31/19 1051   01/23/19 1400  piperacillin-tazobactam (ZOSYN) IVPB 2.25 g  Status:  Discontinued     2.25 g 100 mL/hr over 30 Minutes Intravenous Every 8 hours 01/23/19 0717 01/23/19 0947   01/23/19 1400  cefTRIAXone (ROCEPHIN) 2 g in sodium chloride 0.9 % 100 mL IVPB  Status:  Discontinued     2 g 200 mL/hr over 30 Minutes Intravenous Every 24 hours 01/23/19 0947 01/31/19 0857   01/23/19 1400  metroNIDAZOLE (FLAGYL) IVPB 500 mg  Status:  Discontinued     500 mg 100 mL/hr over 60 Minutes Intravenous Every 8 hours 01/23/19 0947 01/31/19 0857   01/21/19 1400  piperacillin-tazobactam (ZOSYN) IVPB 3.375 g  Status:  Discontinued     3.375 g 12.5 mL/hr over 240 Minutes Intravenous Every 8 hours 01/21/19 1325 01/23/19 0717       Assessment/Plan Sepsis/shock Respiratory failure with hypoxia Elevated LFTs-LFTs and Tbili all normalized on 11/13 Uncontrolled type 2 diabetes with diabetic neuropathy Hypertension GERD/Hx GI bleed AKI: intermittent HD per renal Hx AF Hx alcoholism/Gastric  bypass Morbid obesity BMI 53.8  Pancreatitis, abdominal pain, cholelithiasis and jaundice S/P laparoscopic converted to open cholecystectomy, Dr. Ninfa Linden, 11/19 - POD#4 - WBC 15, afebrile - drain is frankly bilious, 175 cc out in last 24h - will discuss with MD, likely needs ERCP but hx of gastric bypass may complicate this  - tolerating diet and having bowel function - continue to mobilize as able  FEN:CM diet [id number] 11/7-11/9; Rocephin/Flagyl 11/9-11/17; Unasyn 11/17>> [id number] Follow up: TBD   LOS: 17 days    Brigid Re , [facility] 02/06/2019, 8:39 AM Please see Amion for pager number during day hours 7:00am-4:30pm

## 2019-02-06 NOTE — Progress Notes (Addendum)
Patient ID: Phillip Maldonado, male   DOB: [birth year], 68 y.o.   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 fib, chronic abdominal pain, recent treatment for upper GI bleeding presented with acute abdominal pain.  He was admitted for gallstone pancreatitis.  He was noted to have septic shock on 01/21/2019 and was transferred to ICU.  He was eventually weaned off of pressors and transferred back to [facility] service.  MRCP showed acute pancreatitis, cholelithiasis, no CBD obstruction.  General surgery was consulted.  He was also found to have worsening creatinine.  Nephrology was consulted.  He needed temporary hemodialysis.  He underwent cholecystectomy on 02/02/2019.  Assessment & Plan:   Sepsis/septic shock: Present on admission Acute cholecystitis Acute gallstone pancreatitis -Initially required ICU care and vasopressors.  After blood pressure improved, he was transferred to the floor under [place] care. -MRCP showed mild peripancreatic fluid along pancreatic head/uncinate process related to acute pancreatitis.  No fluid collection or pseudocyst.  Cholelithiasis -HIDA scan was ordered: Patient refused -GI was consulted: Recommended outpatient follow-up with Dr. Carlean Purl in 2 to 4 weeks -Was on Rocephin and Flagyl: Currently still on Unasyn -Blood cultures on 01/21/2019: Anaerobic bottle only with gram-negative rods: Bacteroides vulgatus -Status post laparoscopic converted to open cholecystectomy on 02/02/2019.  Diet/pain management/wound care/duration of antibiotic treatment as per general surgery recommendations. -PT recommends home health PT. -Currently afebrile and hemodynamically stable.  Leukocytosis -Persistent.  Monitor.  Acute kidney injury on chronic kidney disease  stage III -developed AKI in the setting of septic shock.  Creatinine peaked to 8.26 -Renal ultrasound showed no obstruction.  Urine culture showed no growth.   -Nephrology following : Status post HD x2, last HD was on 01/27/2019.  Currently HD on hold.  Follow urine output. -Creatinine 2.91 today.  No indication for hemodialysis currently.  Acute respiratory failure with hypoxia -Due to combination of COPD/heart failure/OSA -Not on oxygen at home -Currently on room air.  Incentive spirometry.  Essential hypertension -Blood pressure currently stable.  Continue amlodipine and metoprolol  Hyperlipidemia -Statin on hold due to elevated LFTs  Chronic diastolic CHF -Echo showed EF of 60 to 65% with grade 1 diastolic dysfunction -Nephrology following and managing volume.  Strict input and output and daily weights.  Diabetes mellitus type 2 uncontrolled with hyperglycemia -Continue Lantus.  Continue CBGs with SSI.  Left knee pain --Patient was supposed to have some type of procedure few months ago however this was delayed as his wife had Covid. -Orthopedics consulted and appreciated, status post left knee aspiration and injection (under 90 mL of clear fluid was obtained, 6 mL of 0.5% Marcaine and 80 mg of Depo-Medrol injected)  GERD/history of GI bleed -Continue PPI  History of paroxysmal A. Fib -Currently rate controlled and in sinus rhythm.  Not on anticoagulation as an outpatient.  Continue metoprolol.  Morbid obesity  -outpatient follow-up  Generalized deconditioning -PT recommends home health PT   DVT prophylaxis: Heparin Code Status: Full Family Communication: Spoke to patient at bedside Disposition Plan: Home with home health PT once cleared by general surgery Consultants: PCCM/general surgery/nephrology/gastroenterology's orthopedic surgery  Procedures:  Echo IMPRESSIONS    1. Left ventricular ejection fraction, by visual estimation, is 60 to 65%. The left  ventricle has normal function. There is no left  ventricular hypertrophy.  2. Left ventricular diastolic parameters are consistent with Grade I diastolic dysfunction (impaired relaxation).  3. Global right ventricle has normal systolic function.The right ventricular size is normal. No increase in right ventricular wall thickness.  4. Left atrial size was mildly dilated.  5. Right atrial size was normal.  6. Moderate mitral annular calcification.  7. The mitral valve is normal in structure. No evidence of mitral valve regurgitation. No evidence of mitral stenosis.  8. The tricuspid valve is normal in structure. Tricuspid valve regurgitation is not demonstrated.  9. The aortic valve is tricuspid. Aortic valve regurgitation is not visualized. Mild aortic valve sclerosis without stenosis. 10. The pulmonic valve was normal in structure. Pulmonic valve regurgitation is not visualized. 11. The inferior vena cava is normal in size with greater than 50% respiratory variability, suggesting right atrial pressure of 3 mmHg. 12. The interatrial septum was not well visualized.  Left knee aspiration and injection  Insertion of right IJ nontunneled HD cath  laparoscopic converted to open cholecystectomy on 02/02/2019   Antimicrobials:  Anti-infectives (From admission, onward)   Start     Dose/Rate Route Frequency Ordered Stop   01/31/19 2200  Ampicillin-Sulbactam (UNASYN) 3 g in sodium chloride 0.9 % 100 mL IVPB     3 g 200 mL/hr over 30 Minutes Intravenous Every 12 hours 01/31/19 1051     01/31/19 1000  Ampicillin-Sulbactam (UNASYN) 3 g in sodium chloride 0.9 % 100 mL IVPB  Status:  Discontinued     3 g 200 mL/hr over 30 Minutes Intravenous Every 24 hours 01/31/19 0859 01/31/19 1051   01/23/19 1400  piperacillin-tazobactam (ZOSYN) IVPB 2.25 g  Status:  Discontinued     2.25 g 100 mL/hr over 30 Minutes Intravenous Every 8 hours 01/23/19 0717 01/23/19 0947   01/23/19 1400  cefTRIAXone (ROCEPHIN) 2 g in  sodium chloride 0.9 % 100 mL IVPB  Status:  Discontinued     2 g 200 mL/hr over 30 Minutes Intravenous Every 24 hours 01/23/19 0947 01/31/19 0857   01/23/19 1400  metroNIDAZOLE (FLAGYL) IVPB 500 mg  Status:  Discontinued     500 mg 100 mL/hr over 60 Minutes Intravenous Every 8 hours 01/23/19 0947 01/31/19 0857   01/21/19 1400  piperacillin-tazobactam (ZOSYN) IVPB 3.375 g  Status:  Discontinued     3.375 g 12.5 mL/hr over 240 Minutes Intravenous Every 8 hours 01/21/19 1325 01/23/19 0717      Subjective: Patient seen and examined at bedside.  Poor historian.  Sleepy, wakes up only   very slightly, hardly answers any questions.   Objective: Vitals:   02/05/19 0836 02/05/19 1301 02/05/19 2117 02/06/19 0520  BP:  (!) 143/84 138/77 (!) 148/86  Pulse:  73 74 81  Resp: 15 18 16 16   Temp:  (!) 97.4 F (36.3 C) 98.5 F (36.9 C) 98.2 F (36.8 C)  TempSrc:   Oral Oral  SpO2: 92% 98% 95% 95%  Weight:    (!) 153 kg  Height:        Intake/Output Summary (Last 24 hours) at 02/06/2019 0750 Last data filed at 02/06/2019 [id number] Gross per 24 hour  Intake 1413.14 ml  Output 2975 ml  Net -1561.86 ml   Filed Weights   02/04/19 0651 02/05/19 0531 02/06/19 0520  Weight: (!) 156.3 kg (!) 153.4 kg (!) 153 kg    Examination:  General exam: No distress.  Looks chronically ill.  Sleepy, wakes up only very slightly, hardly answers any questions. Respiratory  system: Bilateral decreased breath sounds mostly at the bases but some scattered crackles  cardiovascular system: Rate controlled, S1-S2 heard Gastrointestinal system: Abdomen is nondistended, soft and dressing present with mild tenderness. Normal bowel sounds heard. Extremities: No cyanosis; trace bilateral lower extremity edema.  Right upper extremity edema present  Data Reviewed: I have personally reviewed following labs and imaging studies  CBC: Recent Labs  Lab 02/02/19 0444 02/02/19 2206 02/03/19 0353 02/04/19 0254 02/05/19 0455   WBC 18.9* 27.7* 19.6* 14.2* 14.9*  NEUTROABS 14.8*  --  16.3* 11.0* 11.2*  HGB 12.8* 12.3* 11.2* 10.6* 11.2*  HCT 40.8 39.4 35.6* 33.6* 36.9*  MCV 89.5 91.8 91.3 90.3 93.9  PLT 353 449* 324 295 [id number]   Basic Metabolic Panel: Recent Labs  Lab 01/31/19 0350 02/01/19 0450 02/02/19 0444 02/03/19 0353 02/04/19 0254 02/05/19 0455  NA 137 139 139 139 139 140  K 4.0 3.8 3.6 4.0 3.7 3.9  CL 101 101 102 103 107 107  CO2 19* 19* 23 22 20* 23  GLUCOSE 215* 164* 158* 185* 155* 155*  BUN 104* 110* 64* 74* 65* 53*  CREATININE 7.30* 7.01* 4.74* 4.69* 3.87* 3.23*  CALCIUM 8.5* 8.6* 8.3* 8.6* 8.4* 8.9  MG  --   --  2.0  --  2.0 1.8  PHOS 9.5* 9.6*  --   --   --   --    GFR: Estimated Creatinine Clearance: 32.5 mL/min (A) (by C-G formula based on SCr of 3.23 mg/dL (H)). Liver Function Tests: Recent Labs  Lab 02/01/19 0450 02/02/19 0444 02/03/19 0353 02/04/19 0254 02/05/19 0455  AST  --  14* 31 23 20   ALT  --  12 18 15 15   ALKPHOS  --  81 69 68 69  BILITOT  --  0.7 0.8 0.9 0.8  PROT  --  6.6 6.5 6.2* 6.8  ALBUMIN 2.3* 2.4* 2.5* 2.3* 2.5*   No results for input(s): LIPASE, AMYLASE in the last 168 hours. No results for input(s): AMMONIA in the last 168 hours. Coagulation Profile: No results for input(s): INR, PROTIME in the last 168 hours. Cardiac Enzymes: No results for input(s): CKTOTAL, CKMB, CKMBINDEX, TROPONINI in the last 168 hours. BNP (last 3 results) No results for input(s): PROBNP in the last 8760 hours. HbA1C: No results for input(s): HGBA1C in the last 72 hours. CBG: Recent Labs  Lab 02/04/19 2117 02/05/19 0802 02/05/19 1158 02/05/19 1602 02/05/19 2118  GLUCAP 143* 179* 215* 187* 147*   Lipid Profile: No results for input(s): CHOL, HDL, LDLCALC, TRIG, CHOLHDL, LDLDIRECT in the last 72 hours. Thyroid Function Tests: No results for input(s): TSH, T4TOTAL, FREET4, T3FREE, THYROIDAB in the last 72 hours. Anemia Panel: No results for input(s): VITAMINB12, FOLATE,  FERRITIN, TIBC, IRON, RETICCTPCT in the last 72 hours. Sepsis Labs: No results for input(s): PROCALCITON, LATICACIDVEN in the last 168 hours.  No results found for this or any previous visit (from the past 240 hour(s)).       Radiology Studies: No results found.      Scheduled Meds: . amLODipine  5 mg Oral Daily  . Chlorhexidine Gluconate Cloth  6 each Topical Daily  . DULoxetine  60 mg Oral Daily  . heparin injection (subcutaneous)  5,000 Units Subcutaneous Q8H  . insulin aspart  0-5 Units Subcutaneous QHS  . insulin aspart  0-9 Units Subcutaneous TID WC  . insulin glargine  20 Units Subcutaneous Q2200  . mouth rinse  15 mL Mouth Rinse BID  . metoprolol  succinate  100 mg Oral Daily  . pantoprazole  40 mg Oral Daily  . simethicone  80 mg Oral QID  . sodium chloride flush  10-40 mL Intracatheter Q12H  . sodium chloride flush  10-40 mL Intracatheter Q12H  . thiamine  100 mg Oral Daily  . traZODone  300 mg Oral QHS   Continuous Infusions: . ampicillin-sulbactam (UNASYN) IV 3 g (02/06/19 [id number])          Aline August, MD [facility] 02/06/2019, 7:50 AM

## 2019-02-06 NOTE — Consult Note (Addendum)
Referring Provider:    CCS       Primary Care Physician:  Patient, No Pcp Per Primary Gastroenterologist:   Silvano Rusk, MD          Reason for Consultation:    Possible bile leak               ASSESSMENT /  PLAN    1. 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 He is s/p open cholecystectomy on 02/02/19. Now with bilious output in drain. Suspect bile duct leak.  -Based on EGD findings / photos from March 2016 patient has had a revision of Roux-en-Y bypass. Therefore we should be able to reach the  papilla  -Patient will be scheduled for ERCP for treatment of presumed bile duct leak.. Given co-morbidities he is at increased risk for procedures. The risks / benefits of ERCP were explained to the patient and his wife. Patient agrees to proceed.  -of note, Indocin suppositories usually given post ERCP to prevent post procedure pancreatitis / worsening pancreatitis. Not ordered as Piroxicam caused tongue swelling  -Will stop sq Heparin after tonight's dose.   2. AKI on CKD requiring temporary HD. Improving.   3. Hx of bleeding marginal ulcer in 2014.  -continue PPI     Attending Physician Note   I have taken a history, examined the patient and reviewed the chart. I agree with the Advanced Practitioner's note, impression and recommendations.   Bile leak following open cholecystectomy on 02/02/2019  Prior Roux-en-Y bypass with revision per 05/2014 EGD findings  Recent pancreatitis, resolved History of marginal ulcer with bleed in 2014  ERCP with biliary stent tomorrow. Altered UGI tract anatomy could impact ERCP success. Pt at increased risk of pancreatitis, cardiopulmonary complications. He and his wife understand the standard ERCP risks, benefits and his increased risks. The patient consents to proceed.   Lucio Edward, MD [facility]    HPI:     Phillip Maldonado is a 68 y.o. male with pmh significant for but  not limited to DM, diastolic CHF, CKD, AFIB, fundic gland polyps, remote gastric bypass,  revision surgeries, upper GI bleed secondary to marginal ulcer (2014). obesity, chronic constipation.   Patient admitted 01/20/19 with right mid abdominal pain , lethargy, hypotension, leukocytosis.  Condition deteriorated, lipase found to be > 1000. Developed AKI on CKD secondary to hypotension, ACEI / ARB, IV contrast. Required a few sessions of hemodialysis . Liver tests mildly elevated.  Known history of cholelithiasis.  CT scan and [place] negative for biliary duct dilation. CCS consulted. Transferred to ICU for pressors. [facility] was on biliary backup, saw patient 01/22/19 for pancreatitis and ordered MRI.   01/22/19 - MRI.  Negative for biliary obstruction but did confirm cholelithiasis, mild gallbladder wall thickening and acute pancreatitis without fluid collections / pseudocysts.   02/02/19-  Had open cholecystectomy   02/06/19 - increased abdominal pain. Drain output is bilious. GI consulted for possible ERCP. Liver tests normal except for low albumin.   [name] is having generalized abdominal pain, worse in RUQ. He has nausea but feels it is mostly secondary to intense abdominal pain. His mouth is burning. No heartburn. Takes protonix at home. No other GI complaints at present. Sitting in bedside chair, wife feeding him. Tolerating diet.     Past Medical History:  Diagnosis Date  . Anemia   . Angina   . Anxiety   . Arthritis   .  Atrial fibrillation with RVR ([place]) 01/24/2013  . Blood transfusion    last 10' 14- "GI bleed"  . CHF (congestive heart failure) ([place])   . CKD (chronic kidney disease) stage 3, GFR 30-59 ml/min 01/08/2016  . Depression   . Diabetes mellitus ([place]) 09/18/2011  . Diverticulosis   . DJD (degenerative joint disease)   . DM type 2, uncontrolled, with neuropathy ([place]) 12/26/2013  . Edema extremities    bilateral lower extremities-"weepy areas due to fluid retention"  .  Fatty liver   . Fundic gland polyps of stomach, benign   . Headache(784.0)   . Hepatitis B   . History of alcohol abuse    last drink in 1993  . Hypertension   . Neuromuscular disorder ([place])   . 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 Obesity   . Obesity, morbid ([place]) 07/30/2015  . Pneumonia    not at present time  . Renal insufficiency   . Shortness of breath   . Sleep apnea, obstructive    does where bipap. 06-04-14 "doesn't use much now, no mask/tubing now.    Past Surgical History:  Procedure Laterality Date  . ABDOMINAL SURGERY     [phone]  . BACK SURGERY     x 8 -,multiple fusions(cervical to lumbar)  . CHOLECYSTECTOMY N/A 02/02/2019   Procedure: attempted LAPAROSCOPIC CHOLECYSTECTOMY;  Surgeon: Coralie Keens, MD;  Location: [facility];  Service: General;  Laterality: N/A;  . CHOLECYSTECTOMY  02/02/2019   Procedure: Cholecystectomy;  Surgeon: Coralie Keens, MD;  Location: [place];  Service: General;;  . COLONOSCOPY    . COLONOSCOPY WITH PROPOFOL N/A 06/14/2014   Procedure: COLONOSCOPY WITH PROPOFOL;  Surgeon: Gatha Mayer, MD;  Location: [facility];  Service: Endoscopy;  Laterality: N/A;  . DIAGNOSTIC LAPAROSCOPY    . ESOPHAGOGASTRODUODENOSCOPY N/A 01/03/2013   Procedure: ESOPHAGOGASTRODUODENOSCOPY (EGD);  Surgeon: Arta Silence, MD;  Location: [facility];  Service: Endoscopy;  Laterality: N/A;  . ESOPHAGOGASTRODUODENOSCOPY N/A 01/03/2013   Procedure: ESOPHAGOGASTRODUODENOSCOPY (EGD);  Surgeon: Arta Silence, MD;  Location: [facility];  Service: Endoscopy;  Laterality: N/A;  . ESOPHAGOGASTRODUODENOSCOPY N/A 01/23/2013   Procedure: ESOPHAGOGASTRODUODENOSCOPY (EGD);  Surgeon: Lear Ng, MD;  Location: [facility];  Service: Endoscopy;  Laterality: N/A;  . ESOPHAGOGASTRODUODENOSCOPY Left 01/27/2013   Procedure: ESOPHAGOGASTRODUODENOSCOPY (EGD);  Surgeon: Lear Ng, MD;  Location: [place];  Service: Endoscopy;  Laterality: Left;  .  ESOPHAGOGASTRODUODENOSCOPY N/A 06/14/2014   Procedure: ESOPHAGOGASTRODUODENOSCOPY (EGD);  Surgeon: Gatha Mayer, MD;  Location: [facility];  Service: Endoscopy;  Laterality: N/A;  . ESOPHAGOSCOPY N/A 01/01/2013   Procedure: ESOPHAGOSCOPY;  Surgeon: Jeryl Columbia, MD;  Location: [place];  Service: Endoscopy;  Laterality: N/A;  . GASTRIC BYPASS  1977   Reversed in 1992 and revision 1994  . HYDROCELE EXCISION  1996   x 2   . KNEE SURGERY Left 2004    Prior to Admission medications   Medication Sig Start Date End Date Taking? Authorizing Provider  acetaminophen (TYLENOL) 650 MG CR tablet Take 2 tablets (1,300 mg total) by mouth 2 (two) times daily. 10/01/16  Yes [name], NP  amLODipine (NORVASC) 5 MG tablet Take 5 mg by mouth daily. 09/19/18  Yes [provider]  atorvastatin (LIPITOR) 40 MG tablet Take 1 tablet (40 mg total) by mouth daily. 02/22/17  Yes [name], NP  cholecalciferol (VITAMIN D3) 25 MCG (1000 UT) tablet Take 1,000 Units by mouth daily.   Yes [provider]  DULoxetine (CYMBALTA) 60 MG capsule TAKE 1 CAPSULE(60 MG) BY MOUTH TWICE DAILY Patient taking differently: Take 60 mg by mouth daily. TAKE 1 CAPSULE(60 MG) BY MOUTH TWICE DAILY 02/22/17  Yes [name], NP  Insulin Glargine (LANTUS SOLOSTAR) 100 UNIT/ML Solostar Pen inject 22-24 UNITS EVERY MORNING 02/22/17  Yes [name], NP  losartan (COZAAR) 100 MG tablet Take 100 mg by mouth daily. 11/03/18  Yes [provider]  metFORMIN (GLUCOPHAGE) 1000 MG tablet Take one tablet by mouth twice daily with a meal to control blood sugar 01/22/17  Yes [name], NP  metoprolol succinate (TOPROL-XL) 100 MG 24 hr tablet Take 100 mg by mouth daily. 09/22/18  Yes [provider]  Multiple Vitamin (MULITIVITAMIN WITH MINERALS) TABS Take 1 tablet by mouth daily.   Yes [provider]  pantoprazole (PROTONIX) 40 MG tablet TAKE 1 TABLET(40 MG) BY MOUTH TWICE  DAILY Patient taking differently: Take 40 mg by mouth 2 (two) times daily. TAKE 1 TABLET(40 MG) BY MOUTH TWICE DAILY 02/22/17  Yes [name], NP  polyethylene glycol (MIRALAX / GLYCOLAX) packet Take 17 g by mouth daily as needed for mild constipation or moderate constipation.   Yes [provider]  polyvinyl alcohol (LIQUIFILM TEARS) 1.4 % ophthalmic solution Place 1 drop into both eyes as needed for dry eyes.   Yes [provider]  pregabalin (LYRICA) 150 MG capsule Take 150 mg by mouth 3 (three) times daily. 09/22/18  Yes [provider]  torsemide (DEMADEX) 100 MG tablet Take 1 tablet (100 mg total) by mouth 2 (two) times daily. Patient taking differently: Take 50 mg by mouth daily.  11/03/16  Yes [name], NP  gabapentin (NEURONTIN) 300 MG capsule Take One to Two tablets by mouth three times daily as needed for pain. Patient not taking: Reported on 01/21/2019 02/22/17   [name], NP  glucose blood (ACCU-CHEK AVIVA PLUS) test strip 1 each by Other route See admin instructions. Check blood sugar twice daily    [provider]  lisinopril (PRINIVIL,ZESTRIL) 40 MG tablet TAKE 1 TABLET(40 MG) BY MOUTH DAILY Patient not taking: No sig reported 12/31/16   [name], NP  methadone (DOLOPHINE) 10 MG tablet Take 1 tablet (10 mg total) by mouth every 8 (eight) hours as needed. Patient not taking: Reported on 01/21/2019 01/14/17   Hollace Kinnier L, DO  metolazone (ZAROXOLYN) 2.5 MG tablet One daily to help reduce edema Patient not taking: Reported on 01/21/2019 02/22/17   [name], NP  metoprolol tartrate (LOPRESSOR) 50 MG tablet TAKE 1 TABLET(50 MG) BY MOUTH TWICE DAILY Patient not taking: Reported on 01/21/2019 02/22/17   [name], NP  naloxegol oxalate (MOVANTIK) 25 MG TABS tablet Take 1 tablet (25 mg total) by mouth daily. Patient not taking: Reported on 01/21/2019 02/22/17   [name], NP  nystatin cream  (MYCOSTATIN) Apply to yeast dermatitis in groin daily Patient not taking: Reported on 01/21/2019 01/20/17   [name], NP  traZODone (DESYREL) 100 MG tablet TAKE 3 TABLETS BY MOUTH AT BEDTIME FOR REST Patient not taking: Reported on 01/21/2019 02/22/17   [name], NP  triamcinolone cream (KENALOG) 0.1 % Apply sparingly to rash up to twice daily Patient not taking: Reported on 01/21/2019 01/20/17   [name], NP    Current Facility-Administered Medications  Medication Dose Route Frequency Provider Last Rate Last Dose  . acetaminophen (TYLENOL) tablet 650 mg  650 mg Oral Q6H PRN Kinsinger, Arta Bruce, MD      . amLODipine (NORVASC) tablet 5 mg  5 mg Oral Daily Rayburn, Kelly A, PA-C   5 mg at 02/06/19 0911  . Ampicillin-Sulbactam (UNASYN) 3 g in sodium chloride 0.9 % 100 mL IVPB  3 g Intravenous Q12H Rayburn, Kelly A, PA-C 200 mL/hr at 02/06/19 0512 3 g at 02/06/19 0512  . bisacodyl (DULCOLAX) suppository 10 mg  10 mg Rectal Daily PRN Rayburn, Floyce Stakes, PA-C      . Chlorhexidine Gluconate Cloth 2 % PADS 6 each  6 each Topical Daily Rayburn, Kelly A, PA-C   6 each at 02/06/19 0936  . DULoxetine (CYMBALTA) DR capsule 60 mg  60 mg Oral Daily Rayburn, Kelly A, PA-C   60 mg at 02/06/19 0911  . heparin injection 5,000 Units  5,000 Units Subcutaneous Q8H Rayburn, Kelly A, PA-C   5,000 Units at 02/06/19 [id number]  . hydrOXYzine (ATARAX/VISTARIL) tablet 25 mg  25 mg Oral TID PRN Rayburn, Claiborne Billings A, PA-C   25 mg at 02/02/19 2132  . insulin aspart (novoLOG) injection 0-5 Units  0-5 Units Subcutaneous QHS Rayburn, Kelly A, PA-C   2 Units at 02/02/19 2143  . insulin aspart (novoLOG) injection 0-9 Units  0-9 Units Subcutaneous TID WC Rayburn, Kelly A, PA-C   1 Units at 02/06/19 0911  . insulin glargine (LANTUS) injection 20 Units  20 Units Subcutaneous Q2200 Rayburn, Kelly A, PA-C   20 Units at 02/05/19 2132  . levalbuterol (XOPENEX) nebulizer solution 0.63 mg  0.63 mg Nebulization Q6H PRN  Rayburn, Kelly A, PA-C      . MEDLINE mouth rinse  15 mL Mouth Rinse BID Rayburn, Kelly A, PA-C   15 mL at 02/06/19 0936  . metoprolol succinate (TOPROL-XL) 24 hr tablet 100 mg  100 mg Oral Daily Rayburn, Kelly A, PA-C   100 mg at 02/06/19 0911  . ondansetron (ZOFRAN) tablet 4 mg  4 mg Oral Q6H PRN Rayburn, Kelly A, PA-C       Or  . ondansetron (ZOFRAN) injection 4 mg  4 mg Intravenous Q6H PRN Rayburn, Kelly A, PA-C   4 mg at 01/23/19 0731  . oxyCODONE (Oxy IR/ROXICODONE) immediate release tablet 5 mg  5 mg Oral Q6H PRN Kinsinger, Arta Bruce, MD   5 mg at 02/06/19 0512  . pantoprazole (PROTONIX) EC tablet 40 mg  40 mg Oral Daily Rayburn, Kelly A, PA-C   40 mg at 02/06/19 0911  . polyethylene glycol (MIRALAX / GLYCOLAX) packet 17 g  17 g Oral Daily PRN Rayburn, Floyce Stakes, PA-C      . simethicone (MYLICON) chewable tablet 80 mg  80 mg Oral QID Rayburn, Kelly A, PA-C   80 mg at 02/06/19 0911  . sodium chloride flush (NS) 0.9 % injection 10-40 mL  10-40 mL Intracatheter Q12H Rayburn, Kelly A, PA-C   10 mL at 02/05/19 0846  . sodium chloride flush (NS) 0.9 % injection 10-40 mL  10-40 mL Intracatheter PRN Rayburn, Kelly A, PA-C      . sodium chloride flush (NS) 0.9 % injection 10-40 mL  10-40 mL Intracatheter Q12H Starla Link, Kshitiz, MD   10 mL at 02/06/19 0936  . sodium chloride flush (NS) 0.9 % injection 10-40 mL  10-40 mL Intracatheter PRN Starla Link, Kshitiz, MD      . thiamine (VITAMIN B-1) tablet 100 mg  100 mg Oral Daily Rayburn, Kelly A, PA-C   100 mg at 02/06/19  [id number]  . traZODone (DESYREL) tablet 300 mg  300 mg Oral QHS Rayburn, Kelly A, PA-C   300 mg at 02/05/19 2131    Allergies as of 01/20/2019 - Review Complete 01/20/2019  Allergen Reaction Noted  . Latex Other (See Comments) and Rash 05/08/2011  . Piroxicam Other (See Comments) and Swelling 05/08/2011  . Chlorzoxazone  05/04/2017  . Adhesive [tape] Other (See Comments) 05/08/2011    Family History  Problem Relation Age of Onset  . Heart  disease Father   . Skin cancer Father   . COPD Father   . Hypertension Father   . Stroke Father   . Arthritis Mother   . Osteoporosis Mother   . Diabetes Daughter   . Depression Son   . Heart disease Maternal Uncle   . High blood pressure Sister   . High blood pressure Sister   . High blood pressure Son   . Depression Son   . Arthritis Sister   . Arthritis Sister   . Cancer Maternal Grandmother   . Heart disease Maternal Uncle   . Heart disease Paternal Uncle     Social History   Socioeconomic History  . Marital status: Divorced    Spouse name: Not on file  . Number of children: 6  . Years of education: Not on file  . Highest education level: Not on file  Occupational History  . Occupation: DISABLED  Social Needs  . Financial resource strain: Not on file  . Food insecurity    Worry: Not on file    Inability: Not on file  . Transportation needs    Medical: Not on file    Non-medical: Not on file  Tobacco Use  . Smoking status: Never Smoker  . Smokeless tobacco: Never Used  Substance and Sexual Activity  . Alcohol use: No    Alcohol/week: 0.0 standard drinks  . Drug use: No  . Sexual activity: Not Currently  Lifestyle  . Physical activity    Days per week: Not on file    Minutes per session: Not on file  . Stress: Not on file  Relationships  . Social Herbalist on phone: Not on file    Gets together: Not on file    Attends religious service: Not on file    Active member of club or organization: Not on file    Attends meetings of clubs or organizations: Not on file    Relationship status: Not on file  . Intimate partner violence    Fear of current or ex partner: Not on file    Emotionally abused: Not on file    Physically abused: Not on file    Forced sexual activity: Not on file  Other Topics Concern  . Not on file  Social History Narrative   Patient is divorced, he has a total of 6 children 2 of which are deceased. He is a retired  Conservation officer, historic buildings. History of alcoholism. No alcohol now. 2 caffeinated beverages daily. He moved to [place] in last 1-2 years.    Review of Systems: Positive for burning tongue. No odynophagia. All other systems reviewed and negative except where noted in HPI.  Physical Exam: Vital signs in last 24 hours: Temp:  [98.2 F (36.8 C)-98.5 F (36.9 C)] 98.2 F (36.8 C) (11/23 1207) Pulse Rate:  [74-81] 76 (11/23 1207) Resp:  [16] 16 (11/23 1207) BP: (138-155)/(77-88) 155/88 (11/23 1207) SpO2:  [95 %-97 %] 97 % (11/23 1207) Weight:  [  153 kg] 153 kg (11/23 0520) Last BM Date: 02/05/19 General:   Alert, obese male in NAD Psych:  Pleasant, cooperative. Normal mood and affect. Eyes:  Pupils equal, sclera clear, no icterus.   Conjunctiva pink. Ears:  Normal auditory acuity. Nose:  No deformity, discharge,  or lesions. Neck:  Supple; no masses Lungs:  Clear throughout to auscultation.   No wheezes, crackles, or rhonchi.  Heart:  Regular rate and rhythm; 1+ BLE edema Abdomen:  Soft, obese,  Mod RUQ tenderness. A few BS.    Rectal:  Deferred  Msk:  Symmetrical without gross deformities. . Neurologic:  Alert and  oriented x4;  grossly normal neurologically. Skin:  Intact without significant lesions or rashes.   Intake/Output from previous day: 11/22 0701 - 11/23 0700 In: 1413.1 [P.O.:960; IV Piggyback:323.1] Out: 2975 [Urine:2800; Drains:175] Intake/Output this shift: Total I/O In: 0  Out: 540 [Urine:400; Drains:140]  Lab Results: Recent Labs    02/04/19 0254 02/05/19 0455 02/06/19 0717  WBC 14.2* 14.9* 15.0*  HGB 10.6* 11.2* 10.1*  HCT 33.6* 36.9* 32.0*  PLT 295 407* 345   BMET Recent Labs    02/04/19 0254 02/05/19 0455 02/06/19 0717  NA 139 140 139  K 3.7 3.9 3.7  CL 107 107 107  CO2 20* 23 21*  GLUCOSE 155* 155* 156*  BUN 65* 53* 48*  CREATININE 3.87* 3.23* 2.91*  CALCIUM 8.4* 8.9 8.8*   LFT Recent Labs    02/06/19 0717  PROT 6.5  ALBUMIN 2.4*  AST 35   ALT 23  ALKPHOS 86  BILITOT 1.0   PT/INR No results for input(s): LABPROT, INR in the last 72 hours. Hepatitis Panel No results for input(s): HEPBSAG, HCVAB, HEPAIGM, HEPBIGM in the last 72 hours.   . CBC Latest Ref Rng & Units 02/06/2019 02/05/2019 02/04/2019  WBC 4.0 - 10.5 K/uL 15.0(H) 14.9(H) 14.2(H)  Hemoglobin 13.0 - 17.0 g/dL 10.1(L) 11.2(L) 10.6(L)  Hematocrit 39.0 - 52.0 % 32.0(L) 36.9(L) 33.6(L)  Platelets 150 - 400 K/uL 345 407(H) 295    . CMP Latest Ref Rng & Units 02/06/2019 02/05/2019 02/04/2019  Glucose 70 - 99 mg/dL 156(H) 155(H) 155(H)  BUN 8 - 23 mg/dL 48(H) 53(H) 65(H)  Creatinine 0.61 - 1.24 mg/dL 2.91(H) 3.23(H) 3.87(H)  Sodium 135 - 145 mmol/L 139 140 139  Potassium 3.5 - 5.1 mmol/L 3.7 3.9 3.7  Chloride 98 - 111 mmol/L 107 107 107  CO2 22 - 32 mmol/L 21(L) 23 20(L)  Calcium 8.9 - 10.3 mg/dL 8.8(L) 8.9 8.4(L)  Total Protein 6.5 - 8.1 g/dL 6.5 6.8 6.2(L)  Total Bilirubin 0.3 - 1.2 mg/dL 1.0 0.8 0.9  Alkaline Phos 38 - 126 U/L 86 69 68  AST 15 - 41 U/L 35 20 23  ALT 0 - 44 U/L 23 15 15    Studies/Results: No results found.  Principal Problem:   Acute pancreatitis Active Problems:   History of gastric bypass   History of marginal ulcers   DM type 2, uncontrolled, with neuropathy ([facility])   CKD (chronic kidney disease) stage 3, GFR 30-59 ml/min   Abdominal pain   Septic shock ([facility])   Encounter for central line placement   BMI 50.0-59.9, adult ([facility])   Elevated LFTs   Elevated amylase and lipase   Left knee pain   Gouty arthritis    Tye Savoy, NP-C @  02/06/2019, 1:56 PM

## 2019-02-06 NOTE — H&P (View-Only) (Signed)
Referring Provider:    CCS       Primary Care Physician:  Patient, No Pcp Per Primary Gastroenterologist:   Silvano Rusk, MD          Reason for Consultation:    Possible bile leak               ASSESSMENT /  PLAN    1. 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 He is s/p open cholecystectomy on 02/02/19. Now with bilious output in drain. Suspect bile duct leak.  -Based on EGD findings / photos from March 2016 patient has had a revision of Roux-en-Y bypass. Therefore we should be able to reach the  papilla  -Patient will be scheduled for ERCP for treatment of presumed bile duct leak.. Given co-morbidities he is at increased risk for procedures. The risks / benefits of ERCP were explained to the patient and his wife. Patient agrees to proceed.  -of note, Indocin suppositories usually given post ERCP to prevent post procedure pancreatitis / worsening pancreatitis. Not ordered as Piroxicam caused tongue swelling  -Will stop sq Heparin after tonight's dose.   2. AKI on CKD requiring temporary HD. Improving.   3. Hx of bleeding marginal ulcer in 2014.  -continue PPI     Attending Physician Note   I have taken a history, examined the patient and reviewed the chart. I agree with the Advanced Practitioner's note, impression and recommendations.   Bile leak following open cholecystectomy on 02/02/2019  Prior Roux-en-Y bypass with revision per 05/2014 EGD findings  Recent pancreatitis, resolved History of marginal ulcer with bleed in 2014  ERCP with biliary stent tomorrow. Altered UGI tract anatomy could impact ERCP success. Pt at increased risk of pancreatitis, cardiopulmonary complications. He and his wife understand the standard ERCP risks, benefits and his increased risks. The patient consents to proceed.   Lucio Edward, MD [facility]    HPI:     ARNE MASSENGILL is a 68 y.o. male with pmh significant for but  not limited to DM, diastolic CHF, CKD, AFIB, fundic gland polyps, remote gastric bypass,  revision surgeries, upper GI bleed secondary to marginal ulcer (2014). obesity, chronic constipation.   Patient admitted 01/20/19 with right mid abdominal pain , lethargy, hypotension, leukocytosis.  Condition deteriorated, lipase found to be > 1000. Developed AKI on CKD secondary to hypotension, ACEI / ARB, IV contrast. Required a few sessions of hemodialysis . Liver tests mildly elevated.  Known history of cholelithiasis.  CT scan and [place] negative for biliary duct dilation. CCS consulted. Transferred to ICU for pressors. [facility] was on biliary backup, saw patient 01/22/19 for pancreatitis and ordered MRI.   01/22/19 - MRI.  Negative for biliary obstruction but did confirm cholelithiasis, mild gallbladder wall thickening and acute pancreatitis without fluid collections / pseudocysts.   02/02/19-  Had open cholecystectomy   02/06/19 - increased abdominal pain. Drain output is bilious. GI consulted for possible ERCP. Liver tests normal except for low albumin.   Mr. Edgar is having generalized abdominal pain, worse in RUQ. He has nausea but feels it is mostly secondary to intense abdominal pain. His mouth is burning. No heartburn. Takes protonix at home. No other GI complaints at present. Sitting in bedside chair, wife feeding him. Tolerating diet.     Past Medical History:  Diagnosis Date  . Anemia   . Angina   . Anxiety   . Arthritis   .  Atrial fibrillation with RVR ([place]) 01/24/2013  . Blood transfusion    last 10' 14- "GI bleed"  . CHF (congestive heart failure) ([place])   . CKD (chronic kidney disease) stage 3, GFR 30-59 ml/min 01/08/2016  . Depression   . Diabetes mellitus ([place]) 09/18/2011  . Diverticulosis   . DJD (degenerative joint disease)   . DM type 2, uncontrolled, with neuropathy ([place]) 12/26/2013  . Edema extremities    bilateral lower extremities-"weepy areas due to fluid retention"  .  Fatty liver   . Fundic gland polyps of stomach, benign   . Headache(784.0)   . Hepatitis B   . History of alcohol abuse    last drink in 1993  . Hypertension   . Neuromuscular disorder ([place])   . 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 Obesity   . Obesity, morbid ([place]) 07/30/2015  . Pneumonia    not at present time  . Renal insufficiency   . Shortness of breath   . Sleep apnea, obstructive    does where bipap. 06-04-14 "doesn't use much now, no mask/tubing now.    Past Surgical History:  Procedure Laterality Date  . ABDOMINAL SURGERY     [phone]  . BACK SURGERY     x 8 -,multiple fusions(cervical to lumbar)  . CHOLECYSTECTOMY N/A 02/02/2019   Procedure: attempted LAPAROSCOPIC CHOLECYSTECTOMY;  Surgeon: Coralie Keens, MD;  Location: [facility];  Service: General;  Laterality: N/A;  . CHOLECYSTECTOMY  02/02/2019   Procedure: Cholecystectomy;  Surgeon: Coralie Keens, MD;  Location: [facility];  Service: General;;  . COLONOSCOPY    . COLONOSCOPY WITH PROPOFOL N/A 06/14/2014   Procedure: COLONOSCOPY WITH PROPOFOL;  Surgeon: Gatha Mayer, MD;  Location: [facility];  Service: Endoscopy;  Laterality: N/A;  . DIAGNOSTIC LAPAROSCOPY    . ESOPHAGOGASTRODUODENOSCOPY N/A 01/03/2013   Procedure: ESOPHAGOGASTRODUODENOSCOPY (EGD);  Surgeon: Arta Silence, MD;  Location: [facility];  Service: Endoscopy;  Laterality: N/A;  . ESOPHAGOGASTRODUODENOSCOPY N/A 01/03/2013   Procedure: ESOPHAGOGASTRODUODENOSCOPY (EGD);  Surgeon: Arta Silence, MD;  Location: [facility];  Service: Endoscopy;  Laterality: N/A;  . ESOPHAGOGASTRODUODENOSCOPY N/A 01/23/2013   Procedure: ESOPHAGOGASTRODUODENOSCOPY (EGD);  Surgeon: Lear Ng, MD;  Location: [facility];  Service: Endoscopy;  Laterality: N/A;  . ESOPHAGOGASTRODUODENOSCOPY Left 01/27/2013   Procedure: ESOPHAGOGASTRODUODENOSCOPY (EGD);  Surgeon: Lear Ng, MD;  Location: [facility];  Service: Endoscopy;  Laterality: Left;  .  ESOPHAGOGASTRODUODENOSCOPY N/A 06/14/2014   Procedure: ESOPHAGOGASTRODUODENOSCOPY (EGD);  Surgeon: Gatha Mayer, MD;  Location: [facility];  Service: Endoscopy;  Laterality: N/A;  . ESOPHAGOSCOPY N/A 01/01/2013   Procedure: ESOPHAGOSCOPY;  Surgeon: Jeryl Columbia, MD;  Location: [place] [facility];  Service: Endoscopy;  Laterality: N/A;  . GASTRIC BYPASS  1977   Reversed in 1992 and revision 1994  . HYDROCELE EXCISION  1996   x 2   . KNEE SURGERY Left 2004    Prior to Admission medications   Medication Sig Start Date End Date Taking? Authorizing Provider  acetaminophen (TYLENOL) 650 MG CR tablet Take 2 tablets (1,300 mg total) by mouth 2 (two) times daily. 10/01/16  Yes [name], NP  amLODipine (NORVASC) 5 MG tablet Take 5 mg by mouth daily. 09/19/18  Yes [provider]  atorvastatin (LIPITOR) 40 MG tablet Take 1 tablet (40 mg total) by mouth daily. 02/22/17  Yes [name], NP  cholecalciferol (VITAMIN D3) 25 MCG (1000 UT) tablet Take 1,000 Units by mouth daily.   Yes [provider]  DULoxetine (CYMBALTA) 60 MG capsule TAKE 1 CAPSULE(60 MG) BY MOUTH TWICE DAILY Patient taking differently: Take 60 mg by mouth daily. TAKE 1 CAPSULE(60 MG) BY MOUTH TWICE DAILY 02/22/17  Yes [name], NP  Insulin Glargine (LANTUS SOLOSTAR) 100 UNIT/ML Solostar Pen inject 22-24 UNITS EVERY MORNING 02/22/17  Yes [name], NP  losartan (COZAAR) 100 MG tablet Take 100 mg by mouth daily. 11/03/18  Yes [provider]  metFORMIN (GLUCOPHAGE) 1000 MG tablet Take one tablet by mouth twice daily with a meal to control blood sugar 01/22/17  Yes [name], NP  metoprolol succinate (TOPROL-XL) 100 MG 24 hr tablet Take 100 mg by mouth daily. 09/22/18  Yes [provider]  Multiple Vitamin (MULITIVITAMIN WITH MINERALS) TABS Take 1 tablet by mouth daily.   Yes [provider]  pantoprazole (PROTONIX) 40 MG tablet TAKE 1 TABLET(40 MG) BY MOUTH TWICE  DAILY Patient taking differently: Take 40 mg by mouth 2 (two) times daily. TAKE 1 TABLET(40 MG) BY MOUTH TWICE DAILY 02/22/17  Yes [name], NP  polyethylene glycol (MIRALAX / GLYCOLAX) packet Take 17 g by mouth daily as needed for mild constipation or moderate constipation.   Yes [provider]  polyvinyl alcohol (LIQUIFILM TEARS) 1.4 % ophthalmic solution Place 1 drop into both eyes as needed for dry eyes.   Yes [provider]  pregabalin (LYRICA) 150 MG capsule Take 150 mg by mouth 3 (three) times daily. 09/22/18  Yes [provider]  torsemide (DEMADEX) 100 MG tablet Take 1 tablet (100 mg total) by mouth 2 (two) times daily. Patient taking differently: Take 50 mg by mouth daily.  11/03/16  Yes [name], NP  gabapentin (NEURONTIN) 300 MG capsule Take One to Two tablets by mouth three times daily as needed for pain. Patient not taking: Reported on 01/21/2019 02/22/17   [name], NP  glucose blood (ACCU-CHEK AVIVA PLUS) test strip 1 each by Other route See admin instructions. Check blood sugar twice daily    [provider]  lisinopril (PRINIVIL,ZESTRIL) 40 MG tablet TAKE 1 TABLET(40 MG) BY MOUTH DAILY Patient not taking: No sig reported 12/31/16   [name], NP  methadone (DOLOPHINE) 10 MG tablet Take 1 tablet (10 mg total) by mouth every 8 (eight) hours as needed. Patient not taking: Reported on 01/21/2019 01/14/17   Hollace Kinnier L, DO  metolazone (ZAROXOLYN) 2.5 MG tablet One daily to help reduce edema Patient not taking: Reported on 01/21/2019 02/22/17   [name], NP  metoprolol tartrate (LOPRESSOR) 50 MG tablet TAKE 1 TABLET(50 MG) BY MOUTH TWICE DAILY Patient not taking: Reported on 01/21/2019 02/22/17   [name], NP  naloxegol oxalate (MOVANTIK) 25 MG TABS tablet Take 1 tablet (25 mg total) by mouth daily. Patient not taking: Reported on 01/21/2019 02/22/17   [name], NP  nystatin cream  (MYCOSTATIN) Apply to yeast dermatitis in groin daily Patient not taking: Reported on 01/21/2019 01/20/17   [name], NP  traZODone (DESYREL) 100 MG tablet TAKE 3 TABLETS BY MOUTH AT BEDTIME FOR REST Patient not taking: Reported on 01/21/2019 02/22/17   [name], NP  triamcinolone cream (KENALOG) 0.1 % Apply sparingly to rash up to twice daily Patient not taking: Reported on 01/21/2019 01/20/17   [name], NP    Current Facility-Administered Medications  Medication Dose Route Frequency Provider Last Rate Last Dose  . acetaminophen (TYLENOL) tablet 650 mg  650 mg Oral Q6H PRN Kinsinger, Arta Bruce, MD      . amLODipine (NORVASC) tablet 5 mg  5 mg Oral Daily Rayburn, Kelly A, PA-C   5 mg at 02/06/19 0911  . Ampicillin-Sulbactam (UNASYN) 3 g in sodium chloride 0.9 % 100 mL IVPB  3 g Intravenous Q12H Rayburn, Kelly A, PA-C 200 mL/hr at 02/06/19 0512 3 g at 02/06/19 0512  . bisacodyl (DULCOLAX) suppository 10 mg  10 mg Rectal Daily PRN Rayburn, Floyce Stakes, PA-C      . Chlorhexidine Gluconate Cloth 2 % PADS 6 each  6 each Topical Daily Rayburn, Kelly A, PA-C   6 each at 02/06/19 0936  . DULoxetine (CYMBALTA) DR capsule 60 mg  60 mg Oral Daily Rayburn, Kelly A, PA-C   60 mg at 02/06/19 0911  . heparin injection 5,000 Units  5,000 Units Subcutaneous Q8H Rayburn, Kelly A, PA-C   5,000 Units at 02/06/19 [id number]  . hydrOXYzine (ATARAX/VISTARIL) tablet 25 mg  25 mg Oral TID PRN Rayburn, Claiborne Billings A, PA-C   25 mg at 02/02/19 2132  . insulin aspart (novoLOG) injection 0-5 Units  0-5 Units Subcutaneous QHS Rayburn, Kelly A, PA-C   2 Units at 02/02/19 2143  . insulin aspart (novoLOG) injection 0-9 Units  0-9 Units Subcutaneous TID WC Rayburn, Kelly A, PA-C   1 Units at 02/06/19 0911  . insulin glargine (LANTUS) injection 20 Units  20 Units Subcutaneous Q2200 Rayburn, Kelly A, PA-C   20 Units at 02/05/19 2132  . levalbuterol (XOPENEX) nebulizer solution 0.63 mg  0.63 mg Nebulization Q6H PRN  Rayburn, Kelly A, PA-C      . MEDLINE mouth rinse  15 mL Mouth Rinse BID Rayburn, Kelly A, PA-C   15 mL at 02/06/19 0936  . metoprolol succinate (TOPROL-XL) 24 hr tablet 100 mg  100 mg Oral Daily Rayburn, Kelly A, PA-C   100 mg at 02/06/19 0911  . ondansetron (ZOFRAN) tablet 4 mg  4 mg Oral Q6H PRN Rayburn, Kelly A, PA-C       Or  . ondansetron (ZOFRAN) injection 4 mg  4 mg Intravenous Q6H PRN Rayburn, Kelly A, PA-C   4 mg at 01/23/19 0731  . oxyCODONE (Oxy IR/ROXICODONE) immediate release tablet 5 mg  5 mg Oral Q6H PRN Kinsinger, Arta Bruce, MD   5 mg at 02/06/19 0512  . pantoprazole (PROTONIX) EC tablet 40 mg  40 mg Oral Daily Rayburn, Kelly A, PA-C   40 mg at 02/06/19 0911  . polyethylene glycol (MIRALAX / GLYCOLAX) packet 17 g  17 g Oral Daily PRN Rayburn, Floyce Stakes, PA-C      . simethicone (MYLICON) chewable tablet 80 mg  80 mg Oral QID Rayburn, Kelly A, PA-C   80 mg at 02/06/19 0911  . sodium chloride flush (NS) 0.9 % injection 10-40 mL  10-40 mL Intracatheter Q12H Rayburn, Kelly A, PA-C   10 mL at 02/05/19 0846  . sodium chloride flush (NS) 0.9 % injection 10-40 mL  10-40 mL Intracatheter PRN Rayburn, Kelly A, PA-C      . sodium chloride flush (NS) 0.9 % injection 10-40 mL  10-40 mL Intracatheter Q12H Starla Link, Kshitiz, MD   10 mL at 02/06/19 0936  . sodium chloride flush (NS) 0.9 % injection 10-40 mL  10-40 mL Intracatheter PRN Starla Link, Kshitiz, MD      . thiamine (VITAMIN B-1) tablet 100 mg  100 mg Oral Daily Rayburn, Kelly A, PA-C   100 mg at 02/06/19  [id number]  . traZODone (DESYREL) tablet 300 mg  300 mg Oral QHS Rayburn, Kelly A, PA-C   300 mg at 02/05/19 2131    Allergies as of 01/20/2019 - Review Complete 01/20/2019  Allergen Reaction Noted  . Latex Other (See Comments) and Rash 05/08/2011  . Piroxicam Other (See Comments) and Swelling 05/08/2011  . Chlorzoxazone  05/04/2017  . Adhesive [tape] Other (See Comments) 05/08/2011    Family History  Problem Relation Age of Onset  . Heart  disease Father   . Skin cancer Father   . COPD Father   . Hypertension Father   . Stroke Father   . Arthritis Mother   . Osteoporosis Mother   . Diabetes Daughter   . Depression Son   . Heart disease Maternal Uncle   . High blood pressure Sister   . High blood pressure Sister   . High blood pressure Son   . Depression Son   . Arthritis Sister   . Arthritis Sister   . Cancer Maternal Grandmother   . Heart disease Maternal Uncle   . Heart disease Paternal Uncle     Social History   Socioeconomic History  . Marital status: Divorced    Spouse name: Not on file  . Number of children: 6  . Years of education: Not on file  . Highest education level: Not on file  Occupational History  . Occupation: DISABLED  Social Needs  . Financial resource strain: Not on file  . Food insecurity    Worry: Not on file    Inability: Not on file  . Transportation needs    Medical: Not on file    Non-medical: Not on file  Tobacco Use  . Smoking status: Never Smoker  . Smokeless tobacco: Never Used  Substance and Sexual Activity  . Alcohol use: No    Alcohol/week: 0.0 standard drinks  . Drug use: No  . Sexual activity: Not Currently  Lifestyle  . Physical activity    Days per week: Not on file    Minutes per session: Not on file  . Stress: Not on file  Relationships  . Social Herbalist on phone: Not on file    Gets together: Not on file    Attends religious service: Not on file    Active member of club or organization: Not on file    Attends meetings of clubs or organizations: Not on file    Relationship status: Not on file  . Intimate partner violence    Fear of current or ex partner: Not on file    Emotionally abused: Not on file    Physically abused: Not on file    Forced sexual activity: Not on file  Other Topics Concern  . Not on file  Social History Narrative   Patient is divorced, he has a total of 6 children 2 of which are deceased. He is a retired  Conservation officer, historic buildings. History of alcoholism. No alcohol now. 2 caffeinated beverages daily. He moved to [place] in last 1-2 years.    Review of Systems: Positive for burning tongue. No odynophagia. All other systems reviewed and negative except where noted in HPI.  Physical Exam: Vital signs in last 24 hours: Temp:  [98.2 F (36.8 C)-98.5 F (36.9 C)] 98.2 F (36.8 C) (11/23 1207) Pulse Rate:  [74-81] 76 (11/23 1207) Resp:  [16] 16 (11/23 1207) BP: (138-155)/(77-88) 155/88 (11/23 1207) SpO2:  [95 %-97 %] 97 % (11/23 1207) Weight:  [  153 kg] 153 kg (11/23 0520) Last BM Date: 02/05/19 General:   Alert, obese male in NAD Psych:  Pleasant, cooperative. Normal mood and affect. Eyes:  Pupils equal, sclera clear, no icterus.   Conjunctiva pink. Ears:  Normal auditory acuity. Nose:  No deformity, discharge,  or lesions. Neck:  Supple; no masses Lungs:  Clear throughout to auscultation.   No wheezes, crackles, or rhonchi.  Heart:  Regular rate and rhythm; 1+ BLE edema Abdomen:  Soft, obese,  Mod RUQ tenderness. A few BS.    Rectal:  Deferred  Msk:  Symmetrical without gross deformities. . Neurologic:  Alert and  oriented x4;  grossly normal neurologically. Skin:  Intact without significant lesions or rashes.   Intake/Output from previous day: 11/22 0701 - 11/23 0700 In: 1413.1 [P.O.:960; IV Piggyback:323.1] Out: 2975 [Urine:2800; Drains:175] Intake/Output this shift: Total I/O In: 0  Out: 540 [Urine:400; Drains:140]  Lab Results: Recent Labs    02/04/19 0254 02/05/19 0455 02/06/19 0717  WBC 14.2* 14.9* 15.0*  HGB 10.6* 11.2* 10.1*  HCT 33.6* 36.9* 32.0*  PLT 295 407* 345   BMET Recent Labs    02/04/19 0254 02/05/19 0455 02/06/19 0717  NA 139 140 139  K 3.7 3.9 3.7  CL 107 107 107  CO2 20* 23 21*  GLUCOSE 155* 155* 156*  BUN 65* 53* 48*  CREATININE 3.87* 3.23* 2.91*  CALCIUM 8.4* 8.9 8.8*   LFT Recent Labs    02/06/19 0717  PROT 6.5  ALBUMIN 2.4*  AST 35   ALT 23  ALKPHOS 86  BILITOT 1.0   PT/INR No results for input(s): LABPROT, INR in the last 72 hours. Hepatitis Panel No results for input(s): HEPBSAG, HCVAB, HEPAIGM, HEPBIGM in the last 72 hours.   . CBC Latest Ref Rng & Units 02/06/2019 02/05/2019 02/04/2019  WBC 4.0 - 10.5 K/uL 15.0(H) 14.9(H) 14.2(H)  Hemoglobin 13.0 - 17.0 g/dL 10.1(L) 11.2(L) 10.6(L)  Hematocrit 39.0 - 52.0 % 32.0(L) 36.9(L) 33.6(L)  Platelets 150 - 400 K/uL 345 407(H) 295    . CMP Latest Ref Rng & Units 02/06/2019 02/05/2019 02/04/2019  Glucose 70 - 99 mg/dL 156(H) 155(H) 155(H)  BUN 8 - 23 mg/dL 48(H) 53(H) 65(H)  Creatinine 0.61 - 1.24 mg/dL 2.91(H) 3.23(H) 3.87(H)  Sodium 135 - 145 mmol/L 139 140 139  Potassium 3.5 - 5.1 mmol/L 3.7 3.9 3.7  Chloride 98 - 111 mmol/L 107 107 107  CO2 22 - 32 mmol/L 21(L) 23 20(L)  Calcium 8.9 - 10.3 mg/dL 8.8(L) 8.9 8.4(L)  Total Protein 6.5 - 8.1 g/dL 6.5 6.8 6.2(L)  Total Bilirubin 0.3 - 1.2 mg/dL 1.0 0.8 0.9  Alkaline Phos 38 - 126 U/L 86 69 68  AST 15 - 41 U/L 35 20 23  ALT 0 - 44 U/L 23 15 15    Studies/Results: No results found.  Principal Problem:   Acute pancreatitis Active Problems:   History of gastric bypass   History of marginal ulcers   DM type 2, uncontrolled, with neuropathy ([facility])   CKD (chronic kidney disease) stage 3, GFR 30-59 ml/min   Abdominal pain   Septic shock ([facility])   Encounter for central line placement   BMI 50.0-59.9, adult (HCC)   Elevated LFTs   Elevated amylase and lipase   Left knee pain   Gouty arthritis    Tye Savoy, NP-C @  02/06/2019, 1:56 PM

## 2019-02-07 ENCOUNTER — Inpatient Hospital Stay (HOSPITAL_COMMUNITY): Payer: Medicare Other | Admitting: Anesthesiology

## 2019-02-07 ENCOUNTER — Encounter (HOSPITAL_COMMUNITY): Payer: Self-pay | Admitting: *Deleted

## 2019-02-07 ENCOUNTER — Encounter (HOSPITAL_COMMUNITY)
Admission: EM | Disposition: A | Discharge status: Home Health | Payer: Self-pay | Source: Home / Self Care | Attending: Internal Medicine

## 2019-02-07 ENCOUNTER — Inpatient Hospital Stay (HOSPITAL_COMMUNITY): Payer: Medicare Other

## 2019-02-07 DIAGNOSIS — K838 Other specified diseases of biliary tract: Secondary | ICD-10-CM

## 2019-02-07 HISTORY — PX: ERCP: SHX5425

## 2019-02-07 HISTORY — PX: BILIARY STENT PLACEMENT: SHX5538

## 2019-02-07 LAB — COMPREHENSIVE METABOLIC PANEL
ALT: 32 U/L (ref 0–44)
AST: 41 U/L (ref 15–41)
Albumin: 2.5 g/dL — ABNORMAL LOW (ref 3.5–5.0)
Alkaline Phosphatase: 93 U/L (ref 38–126)
Anion gap: 12 (ref 5–15)
BUN: 39 mg/dL — ABNORMAL HIGH (ref 8–23)
CO2: 21 mmol/L — ABNORMAL LOW (ref 22–32)
Calcium: 9 mg/dL (ref 8.9–10.3)
Chloride: 110 mmol/L (ref 98–111)
Creatinine, Ser: 2.84 mg/dL — ABNORMAL HIGH (ref 0.61–1.24)
GFR calc Af Amer: 25 mL/min — ABNORMAL LOW (ref >60)
GFR calc non Af Amer: 22 mL/min — ABNORMAL LOW (ref >60)
Glucose, Bld: 142 mg/dL — ABNORMAL HIGH (ref 70–99)
Potassium: 3.1 mmol/L — ABNORMAL LOW (ref 3.5–5.1)
Sodium: 143 mmol/L (ref 135–145)
Total Bilirubin: 0.7 mg/dL (ref 0.3–1.2)
Total Protein: 6.9 g/dL (ref 6.5–8.1)

## 2019-02-07 LAB — CBC WITH DIFFERENTIAL/PLATELET
Abs Immature Granulocytes: 0.1 10*3/uL — ABNORMAL HIGH (ref 0.00–0.07)
Basophils Absolute: 0.1 10*3/uL (ref 0.0–0.1)
Basophils Relative: 0 %
Eosinophils Absolute: 0.2 10*3/uL (ref 0.0–0.5)
Eosinophils Relative: 2 %
HCT: 32.5 % — ABNORMAL LOW (ref 39.0–52.0)
Hemoglobin: 10.1 g/dL — ABNORMAL LOW (ref 13.0–17.0)
Immature Granulocytes: 1 %
Lymphocytes Relative: 12 %
Lymphs Abs: 1.6 10*3/uL (ref 0.7–4.0)
MCH: 28.9 pg (ref 26.0–34.0)
MCHC: 31.1 g/dL (ref 30.0–36.0)
MCV: 93.1 fL (ref 80.0–100.0)
Monocytes Absolute: 0.8 10*3/uL (ref 0.1–1.0)
Monocytes Relative: 6 %
Neutro Abs: 9.9 10*3/uL — ABNORMAL HIGH (ref 1.7–7.7)
Neutrophils Relative %: 79 %
Platelets: 328 10*3/uL (ref 150–400)
RBC: 3.49 MIL/uL — ABNORMAL LOW (ref 4.22–5.81)
RDW: 15.9 % — ABNORMAL HIGH (ref 11.5–15.5)
WBC: 12.6 10*3/uL — ABNORMAL HIGH (ref 4.0–10.5)
nRBC: 0 % (ref 0.0–0.2)

## 2019-02-07 LAB — GLUCOSE, CAPILLARY
Glucose-Capillary: 125 mg/dL — ABNORMAL HIGH (ref 70–99)
Glucose-Capillary: 175 mg/dL — ABNORMAL HIGH (ref 70–99)
Glucose-Capillary: 163 mg/dL — ABNORMAL HIGH (ref 70–99)

## 2019-02-07 LAB — MAGNESIUM: Magnesium: 1.7 mg/dL (ref 1.7–2.4)

## 2019-02-07 SURGERY — ERCP, WITH INTERVENTION IF INDICATED
Anesthesia: General

## 2019-02-07 MED ORDER — PHENYLEPHRINE 40 MCG/ML (10ML) SYRINGE FOR IV PUSH (FOR BLOOD PRESSURE SUPPORT)
PREFILLED_SYRINGE | INTRAVENOUS | Status: DC | PRN
  Administered 2019-02-07: 80 ug via INTRAVENOUS

## 2019-02-07 MED ORDER — OXYCODONE HCL 5 MG PO TABS
5.0000 mg | ORAL_TABLET | Freq: Four times a day (QID) | ORAL | Status: DC | PRN
  Administered 2019-02-07 – 2019-02-08 (×3): 10 mg via ORAL
  Filled 2019-02-07 (×3): qty 2

## 2019-02-07 MED ORDER — SODIUM CHLORIDE 0.9 % IV SOLN
3.0000 g | Freq: Three times a day (TID) | INTRAVENOUS | Status: DC
  Administered 2019-02-08 – 2019-02-10 (×8): 3 g via INTRAVENOUS
  Filled 2019-02-07 (×2): qty 3
  Filled 2019-02-07 (×5): qty 8
  Filled 2019-02-07 (×2): qty 3
  Filled 2019-02-07: qty 8
  Filled 2019-02-07: qty 3

## 2019-02-07 MED ORDER — LIDOCAINE 2% (20 MG/ML) 5 ML SYRINGE
INTRAMUSCULAR | Status: DC | PRN
  Administered 2019-02-07: 60 mg via INTRAVENOUS

## 2019-02-07 MED ORDER — FENTANYL CITRATE (PF) 100 MCG/2ML IJ SOLN
INTRAMUSCULAR | Status: DC | PRN
  Administered 2019-02-07: 100 ug via INTRAVENOUS

## 2019-02-07 MED ORDER — ONDANSETRON HCL 4 MG/2ML IJ SOLN
INTRAMUSCULAR | Status: DC | PRN
  Administered 2019-02-07: 4 mg via INTRAVENOUS

## 2019-02-07 MED ORDER — DEXAMETHASONE SODIUM PHOSPHATE 10 MG/ML IJ SOLN
INTRAMUSCULAR | Status: DC | PRN
  Administered 2019-02-07: 4 mg via INTRAVENOUS

## 2019-02-07 MED ORDER — INDOMETHACIN 50 MG RE SUPP
RECTAL | Status: AC
  Filled 2019-02-07: qty 2

## 2019-02-07 MED ORDER — SODIUM CHLORIDE 0.9 % IV SOLN
INTRAVENOUS | Status: DC | PRN
  Administered 2019-02-07: 25 mL

## 2019-02-07 MED ORDER — MIDAZOLAM HCL 5 MG/5ML IJ SOLN
INTRAMUSCULAR | Status: DC | PRN
  Administered 2019-02-07: 2 mg via INTRAVENOUS

## 2019-02-07 MED ORDER — GLUCAGON HCL RDNA (DIAGNOSTIC) 1 MG IJ SOLR
INTRAMUSCULAR | Status: AC
  Filled 2019-02-07: qty 1

## 2019-02-07 MED ORDER — POTASSIUM CHLORIDE CRYS ER 20 MEQ PO TBCR
20.0000 meq | EXTENDED_RELEASE_TABLET | Freq: Once | ORAL | Status: AC
  Administered 2019-02-07: 20 meq via ORAL
  Filled 2019-02-07: qty 1

## 2019-02-07 MED ORDER — ROCURONIUM BROMIDE 10 MG/ML (PF) SYRINGE
PREFILLED_SYRINGE | INTRAVENOUS | Status: DC | PRN
  Administered 2019-02-07: 40 mg via INTRAVENOUS
  Administered 2019-02-07: 60 mg via INTRAVENOUS

## 2019-02-07 MED ORDER — SODIUM CHLORIDE 0.9 % IV SOLN
INTRAVENOUS | Status: DC | PRN
  Administered 2019-02-07: 12:00:00 via INTRAVENOUS

## 2019-02-07 MED ORDER — ACETAMINOPHEN 500 MG PO TABS
1000.0000 mg | ORAL_TABLET | Freq: Three times a day (TID) | ORAL | Status: DC
  Administered 2019-02-07 – 2019-02-10 (×9): 1000 mg via ORAL
  Filled 2019-02-07 (×9): qty 2

## 2019-02-07 MED ORDER — CIPROFLOXACIN IN D5W 400 MG/200ML IV SOLN
INTRAVENOUS | Status: AC
  Filled 2019-02-07: qty 200

## 2019-02-07 MED ORDER — PROPOFOL 10 MG/ML IV BOLUS
INTRAVENOUS | Status: DC | PRN
  Administered 2019-02-07 (×4): 50 mg via INTRAVENOUS

## 2019-02-07 MED ORDER — BOOST / RESOURCE BREEZE PO LIQD CUSTOM
1.0000 | Freq: Three times a day (TID) | ORAL | Status: DC
  Administered 2019-02-07 – 2019-02-09 (×3): 1 via ORAL

## 2019-02-07 MED ORDER — METHOCARBAMOL 500 MG PO TABS
500.0000 mg | ORAL_TABLET | Freq: Three times a day (TID) | ORAL | Status: DC
  Administered 2019-02-07 (×2): 500 mg via ORAL
  Filled 2019-02-07 (×3): qty 1

## 2019-02-07 MED ORDER — SUGAMMADEX SODIUM 200 MG/2ML IV SOLN
INTRAVENOUS | Status: DC | PRN
  Administered 2019-02-07: 200 mg via INTRAVENOUS

## 2019-02-07 NOTE — Op Note (Signed)
[facility] Patient Name: Phillip Maldonado Procedure Date : 02/07/2019 MRN: [id number] Attending MD: Ladene Artist , MD Date of Birth: 1950-09-15 CSN: [id number] Age: 68 Admit Type: Inpatient Procedure:                ERCP Indications:              Bile leak Providers:                Pricilla Riffle. Fuller Plan, MD, Baird Cancer, RN, Cherylynn Ridges, Technician, Lina Sar, Technician,                            Claybon Jabs CRNA, CRNA Referring MD:             Victorino Sparrow. Kae Heller, MD Medicines:                General Anesthesia Complications:            No immediate complications. Estimated Blood Loss:     Estimated blood loss: none. Procedure:                Pre-Anesthesia Assessment:                           - Prior to the procedure, a History and Physical                            was performed, and patient medications and                            allergies were reviewed. The patient's tolerance of                            previous anesthesia was also reviewed. The risks                            and benefits of the procedure and the sedation                            options and risks were discussed with the patient.                            All questions were answered, and informed consent                            was obtained. Prior Anticoagulants: The patient has                            taken no previous anticoagulant or antiplatelet                            agents. ASA Grade Assessment: III - A patient with  severe systemic disease. After reviewing the risks                            and benefits, the patient was deemed in                            satisfactory condition to undergo the procedure.                           After obtaining informed consent, the scope was                            passed under direct vision. Throughout the                            procedure, the patient's blood  pressure, pulse, and                            oxygen saturations were monitored continuously. The                            [id number] Olympus duodenoscope was                            introduced through the mouth, and used to inject                            contrast into and used to inject contrast into the                            bile duct. The ERCP was accomplished without                            difficulty. The patient tolerated the procedure                            well. Scope In: Scope Out: Findings:      A scout film of the abdomen was obtained. RUQ drain was seen in the area       of the right upper quadrant of the abdomen. The scope was advanced to a       normal major papilla in the descending duodenum. Examination of the       pharynx, larynx and associated structures, and upper GI tract was       normal. The major papilla was normal. A straight Roadrunner wire was       passed into the biliary tree after several passes into the PD. 2 wire       techinque was used briefly however the PD wire fell out. The CBD was       then wire cannulated. The short-nosed traction sphincterotome was passed       over the guidewire and the bile duct was then deeply cannulated.       Contrast was injected. I personally interpreted the bile duct images.       There was limted flow of contrast through the  ducts due to extravation.       Extravasation of contrast originating from the cystic duct was observed.       The intrahepatic ducts were not filled. The CBD was partially filled       without abnormalities noted other than the leak. A cholecystectomy had       been performed. One 8.5 Fr by 7 cm plastic stent with a single external       flap and a single internal flap was placed 6 cm into the common bile       duct. Bile flowed through the stent. The stent was in good position. Impression:               - A bile leak was found at the cystic duct.                            - Prior cholecystectomy.                           - One plastic stent was placed into the common bile                            duct. Recommendation:           - Return patient to hospital ward for ongoing care.                           - Observe patient's clinical course following                            today's ERCP with therapeutic intervention.                           - Repeat ERCP in about 8 weeks for biliary stent                            removal. Procedure Code(s):        --- Professional ---                           [phone], Endoscopic retrograde                            cholangiopancreatography (ERCP); with placement of                            endoscopic stent into biliary or pancreatic duct,                            including pre- and post-dilation and guide wire                            passage, when performed, including sphincterotomy,                            when performed, each stent Diagnosis Code(s):        --- Professional ---  K83.9, Disease of biliary tract, unspecified                           Z90.49, Acquired absence of other specified parts                            of digestive tract                           K83.8, Other specified diseases of biliary tract CPT copyright 2019 [employer]. All rights reserved. The codes documented in this report are preliminary and upon coder review may  be revised to meet current compliance requirements. Ladene Artist, MD 02/07/2019 1:11:48 PM This report has been signed electronically. Number of Addenda: 0

## 2019-02-07 NOTE — Progress Notes (Signed)
Patient ID: Phillip Maldonado, male   DOB: 07/05/1950, 68 y.o.   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 fib, chronic abdominal pain, recent treatment for upper GI bleeding presented with acute abdominal pain.  He was admitted for gallstone pancreatitis.  He was noted to have septic shock on 01/21/2019 and was transferred to ICU.  He was eventually weaned off of pressors and transferred back to [facility] service.  MRCP showed acute pancreatitis, cholelithiasis, no CBD obstruction.  General surgery was consulted.  He was also found to have worsening creatinine.  Nephrology was consulted.  He needed temporary hemodialysis.  He underwent cholecystectomy on 02/02/2019.  Assessment & Plan:   Sepsis/septic shock: Present on admission Acute cholecystitis Acute gallstone pancreatitis -Initially required ICU care and vasopressors.  After blood pressure improved, he was transferred to the floor under [facility] care. -MRCP showed mild peripancreatic fluid along pancreatic head/uncinate process related to acute pancreatitis.  No fluid collection or pseudocyst.  Cholelithiasis -HIDA scan was ordered: Patient refused -GI was consulted: Recommended outpatient follow-up with Dr. Carlean Purl in 2 to 4 weeks -Was on Rocephin and Flagyl: Currently still on Unasyn -Blood cultures on 01/21/2019: Anaerobic bottle only with gram-negative rods: Bacteroides vulgatus -Status post laparoscopic converted to open cholecystectomy on 02/02/2019.  Diet/pain management/wound care/duration of antibiotic treatment as per general surgery recommendations. -PT recommends home health PT. -Currently afebrile and hemodynamically stable. -GI was consulted on 02/06/2019 by general surgery for increased JP drainage with concern  for bile leak.  For possible ERCP by GI today.  Leukocytosis -Persistent.  Monitor.  Acute kidney injury on chronic kidney disease stage III -developed AKI in the setting of septic shock.  Creatinine peaked to 8.26 -Renal ultrasound showed no obstruction.  Urine culture showed no growth.   -Nephrology following : Status post HD x2, last HD was on 01/27/2019.  Currently HD on hold.  Follow urine output. -Creatinine pending today.  No indication for hemodialysis currently.  Acute respiratory failure with hypoxia -Due to combination of COPD/heart failure/OSA -Not on oxygen at home -Currently on room air.  Incentive spirometry.  Essential hypertension -Blood pressure currently stable.  Continue amlodipine and metoprolol  Hyperlipidemia -Statin on hold due to elevated LFTs  Chronic diastolic CHF -Echo showed EF of 60 to 65% with grade 1 diastolic dysfunction -Nephrology following and managing volume.  Strict input and output and daily weights.  Diabetes mellitus type 2 uncontrolled with hyperglycemia -Continue Lantus.  Continue CBGs with SSI.  Left knee pain --Patient was supposed to have some type of procedure few months ago however this was delayed as his wife had Covid. -Orthopedics consulted and appreciated, status post left knee aspiration and injection (under 90 mL of clear fluid was obtained, 6 mL of 0.5% Marcaine and 80 mg of Depo-Medrol injected)  GERD/history of GI bleed -Continue PPI  History of paroxysmal A. Fib -Currently rate controlled and in sinus rhythm.  Not on anticoagulation as an outpatient.  Continue metoprolol.  Morbid obesity  -outpatient follow-up  Generalized deconditioning -PT recommends home health PT   DVT prophylaxis: Heparin Code Status: Full Family Communication: Spoke to patient at bedside Disposition Plan: Home with home health PT once cleared by general surgery/GI Consultants: PCCM/general surgery/nephrology/gastroenterology's orthopedic  surgery  Procedures:  Echo IMPRESSIONS  1. Left ventricular ejection fraction, by visual estimation, is 60 to 65%. The left ventricle has normal function. There is no left ventricular hypertrophy.  2. Left ventricular diastolic parameters are consistent with Grade I diastolic dysfunction (impaired relaxation).  3. Global right ventricle has normal systolic function.The right ventricular size is normal. No increase in right ventricular wall thickness.  4. Left atrial size was mildly dilated.  5. Right atrial size was normal.  6. Moderate mitral annular calcification.  7. The mitral valve is normal in structure. No evidence of mitral valve regurgitation. No evidence of mitral stenosis.  8. The tricuspid valve is normal in structure. Tricuspid valve regurgitation is not demonstrated.  9. The aortic valve is tricuspid. Aortic valve regurgitation is not visualized. Mild aortic valve sclerosis without stenosis. 10. The pulmonic valve was normal in structure. Pulmonic valve regurgitation is not visualized. 11. The inferior vena cava is normal in size with greater than 50% respiratory variability, suggesting right atrial pressure of 3 mmHg. 12. The interatrial septum was not well visualized.  Left knee aspiration and injection  Insertion of right IJ nontunneled HD cath  laparoscopic converted to open cholecystectomy on 02/02/2019   Antimicrobials:  Anti-infectives (From admission, onward)   Start     Dose/Rate Route Frequency Ordered Stop   01/31/19 2200  Ampicillin-Sulbactam (UNASYN) 3 g in sodium chloride 0.9 % 100 mL IVPB     3 g 200 mL/hr over 30 Minutes Intravenous Every 12 hours 01/31/19 1051     01/31/19 1000  Ampicillin-Sulbactam (UNASYN) 3 g in sodium chloride 0.9 % 100 mL IVPB  Status:  Discontinued     3 g 200 mL/hr over 30 Minutes Intravenous Every 24 hours 01/31/19 0859 01/31/19 1051   01/23/19 1400  piperacillin-tazobactam (ZOSYN) IVPB 2.25 g  Status:  Discontinued      2.25 g 100 mL/hr over 30 Minutes Intravenous Every 8 hours 01/23/19 0717 01/23/19 0947   01/23/19 1400  cefTRIAXone (ROCEPHIN) 2 g in sodium chloride 0.9 % 100 mL IVPB  Status:  Discontinued     2 g 200 mL/hr over 30 Minutes Intravenous Every 24 hours 01/23/19 0947 01/31/19 0857   01/23/19 1400  metroNIDAZOLE (FLAGYL) IVPB 500 mg  Status:  Discontinued     500 mg 100 mL/hr over 60 Minutes Intravenous Every 8 hours 01/23/19 0947 01/31/19 0857   01/21/19 1400  piperacillin-tazobactam (ZOSYN) IVPB 3.375 g  Status:  Discontinued     3.375 g 12.5 mL/hr over 240 Minutes Intravenous Every 8 hours 01/21/19 1325 01/23/19 0717      Subjective: Patient seen and examined at bedside.  Poor historian.  Complains of increasing abdominal pain.  No overnight fever or vomiting.  Nursing staff reports increasing JP drainage.   Objective: Vitals:   02/06/19 1207 02/06/19 2212 02/07/19 0541 02/07/19 0542  BP: (!) 155/88 (!) 161/78 (!) 150/68   Pulse: 76 79 73   Resp: 16  18   Temp: 98.2 F (36.8 C) 98.2 F (36.8 C) 98.9 F (37.2 C)   TempSrc: Oral Oral Oral   SpO2: 97% 97% 99%   Weight:    (!) 150.1 kg  Height:        Intake/Output Summary (Last 24 hours) at 02/07/2019 0800 Last data filed at 02/07/2019 0630 Gross per 24 hour  Intake 746.63 ml  Output 2580 ml  Net -1833.37 ml   Filed Weights   02/05/19 0531 02/06/19 0520 02/07/19 0542  Weight: (!) 153.4 kg (!) 153 kg Marland Kitchen)  150.1 kg    Examination:  General exam: No acute distress.  Looks chronically ill.  Answers only a few questions.  Poor historian.   Respiratory system: Bilateral decreased breath sounds mostly at the bases with scattered crackles.  No wheezing  cardiovascular system: S1-S2 heard, rate controlled Gastrointestinal system: Abdomen is nondistended, soft and dressing present with mild tenderness. Normal bowel sounds heard. Extremities: No cyanosis; trace bilateral lower extremity edema.  Right upper extremity edema present   Data Reviewed: I have personally reviewed following labs and imaging studies  CBC: Recent Labs  Lab 02/02/19 0444 02/02/19 2206 02/03/19 0353 02/04/19 0254 02/05/19 0455 02/06/19 0717  WBC 18.9* 27.7* 19.6* 14.2* 14.9* 15.0*  NEUTROABS 14.8*  --  16.3* 11.0* 11.2* 11.8*  HGB 12.8* 12.3* 11.2* 10.6* 11.2* 10.1*  HCT 40.8 39.4 35.6* 33.6* 36.9* 32.0*  MCV 89.5 91.8 91.3 90.3 93.9 92.2  PLT 353 449* 324 295 407* [id number]   Basic Metabolic Panel: Recent Labs  Lab 02/01/19 0450 02/02/19 0444 02/03/19 0353 02/04/19 0254 02/05/19 0455 02/06/19 0717  NA 139 139 139 139 140 139  K 3.8 3.6 4.0 3.7 3.9 3.7  CL 101 102 103 107 107 107  CO2 19* 23 22 20* 23 21*  GLUCOSE 164* 158* 185* 155* 155* 156*  BUN 110* 64* 74* 65* 53* 48*  CREATININE 7.01* 4.74* 4.69* 3.87* 3.23* 2.91*  CALCIUM 8.6* 8.3* 8.6* 8.4* 8.9 8.8*  MG  --  2.0  --  2.0 1.8 1.7  PHOS 9.6*  --   --   --   --   --    GFR: Estimated Creatinine Clearance: 35.7 mL/min (A) (by C-G formula based on SCr of 2.91 mg/dL (H)). Liver Function Tests: Recent Labs  Lab 02/02/19 0444 02/03/19 0353 02/04/19 0254 02/05/19 0455 02/06/19 0717  AST 14* 31 23 20  35  ALT 12 18 15 15 23   ALKPHOS 81 69 68 69 86  BILITOT 0.7 0.8 0.9 0.8 1.0  PROT 6.6 6.5 6.2* 6.8 6.5  ALBUMIN 2.4* 2.5* 2.3* 2.5* 2.4*   No results for input(s): LIPASE, AMYLASE in the last 168 hours. No results for input(s): AMMONIA in the last 168 hours. Coagulation Profile: No results for input(s): INR, PROTIME in the last 168 hours. Cardiac Enzymes: No results for input(s): CKTOTAL, CKMB, CKMBINDEX, TROPONINI in the last 168 hours. BNP (last 3 results) No results for input(s): PROBNP in the last 8760 hours. HbA1C: No results for input(s): HGBA1C in the last 72 hours. CBG: Recent Labs  Lab 02/05/19 2118 02/06/19 0803 02/06/19 1204 02/06/19 1639 02/06/19 2222  GLUCAP 147* 144* 140* 207* 137*   Lipid Profile: No results for input(s): CHOL, HDL, LDLCALC,  TRIG, CHOLHDL, LDLDIRECT in the last 72 hours. Thyroid Function Tests: No results for input(s): TSH, T4TOTAL, FREET4, T3FREE, THYROIDAB in the last 72 hours. Anemia Panel: No results for input(s): VITAMINB12, FOLATE, FERRITIN, TIBC, IRON, RETICCTPCT in the last 72 hours. Sepsis Labs: No results for input(s): PROCALCITON, LATICACIDVEN in the last 168 hours.  No results found for this or any previous visit (from the past 240 hour(s)).       Radiology Studies: No results found.      Scheduled Meds: . amLODipine  5 mg Oral Daily  . Chlorhexidine Gluconate Cloth  6 each Topical Daily  . DULoxetine  60 mg Oral Daily  . insulin aspart  0-5 Units Subcutaneous QHS  . insulin aspart  0-9 Units Subcutaneous TID WC  . insulin glargine  20 Units Subcutaneous Q2200  . mouth rinse  15 mL Mouth Rinse BID  . metoprolol succinate  100 mg Oral Daily  . pantoprazole  40 mg Oral Daily  . simethicone  80 mg Oral QID  . sodium chloride flush  10-40 mL Intracatheter Q12H  . thiamine  100 mg Oral Daily  . traZODone  300 mg Oral QHS   Continuous Infusions: . ampicillin-sulbactam (UNASYN) IV 3 g (02/07/19 [id number])          Aline August, MD [facility] 02/07/2019, 8:00 AM

## 2019-02-07 NOTE — Progress Notes (Signed)
[facility] Progress Note    Assessment/ Plan:   1. Acute renal failure - combination of IV contrast/ hypotension-shock w/  ARB/ACEi on board. Creat worsened and s/p Evidence of incipient recovery and is making good UOP on Lasix- however BUN up and he had some sleepiness/ abd pain/ bloating--> got last session of HD 11/18 - HD catheter is out.  S/p lasix IV on 11/22  - Continue supportive care   - replete K - For today will hold diuretics    1. Septic shock/ abd pain - resolved, suspected gallstone pancreatitis, surgery consulted--> had ccy 11/19, on Unasyn  2. Chronic pain - per primary team   3. Depression- per primary team   4. Chronic diastolic CHF-- volume overload is improved.  Note not compliant with home torsemide - he reports taking 50 mg daily rather than 100 mg ; also reported as being on metolazone  5. Normocytic anemia - no acute indication for PRBC's  Subjective:    Had ERCP with stent placement this morning.  Had 2.1 liters UOP over 11/23.  Patient states he is prescribed torsemide 100 mg BID but he has only been taking torsemide 50 mg daily because he has to go to the bathroom so frequently.  He is on potassium at home but cannot confirm his dose.  He's tired from his procedure.  Review of systems:   Denies shortness of breath  Denies chest pain  Denies n/v but reports abd pain   Objective:   BP (!) 141/82 (BP Location: Right Arm)   Pulse 75   Temp 98.3 F (36.8 C) (Oral)   Resp 20   Ht 5\' 9"  (1.753 m)   Wt (!) 150.1 kg   SpO2 96%   BMI 48.87 kg/m   Intake/Output Summary (Last 24 hours) at 02/07/2019 1507 Last data filed at 02/07/2019 1434 Gross per 24 hour  Intake 866.63 ml  Output 2550 ml  Net -1683.37 ml   Weight change: -2.9 kg  Physical Exam: Gen: adult male in bed in NAD at rest   CVS: RRR no m/r/g Resp: clear bilaterally no c/w/r Abd: soft, tenderness RUQ  Obese habitus Ext: no  Edema lower extremities Dialysis Access:  has been removed   Imaging: Dg Ercp  Result Date: 02/07/2019 CLINICAL DATA:  68 year old male with bile leak. EXAM: ERCP TECHNIQUE: Multiple spot images obtained with the fluoroscopic device and submitted for interpretation post-procedure. FLUOROSCOPY TIME:  Fluoroscopy Time:  1 minutes 59 seconds COMPARISON:  None. FINDINGS: A total of 8 intraoperative saved images are submitted for review. The images demonstrate a flexible endoscope in the descending duodenum with wire cannulation of the common bile duct. Cholangiogram was subsequently performed. There is extravasation of contrast material from the region of the cystic duct stump into the peritoneal space adjacent to a surgical drain. On the final images, a plastic biliary stent has been placed. IMPRESSION: 1. Positive for bile leak. 2. Placement of a plastic biliary stent. These images were submitted for radiologic interpretation only. Please see the procedural report for the amount of contrast and the fluoroscopy time utilized. Electronically Signed   By: Jacqulynn Cadet M.D.   On: 02/07/2019 13:32    Labs: BMET Recent Labs  Lab 02/01/19 0450 02/02/19 0444 02/03/19 0353 02/04/19 0254 02/05/19 0455 02/06/19 0717 02/07/19 0858  NA 139 139 139 139 140 139 143  K 3.8 3.6 4.0 3.7 3.9 3.7 3.1*  CL 101 102 103 107 107 107 110  CO2  19* 23 22 20* 23 21* 21*  GLUCOSE 164* 158* 185* 155* 155* 156* 142*  BUN 110* 64* 74* 65* 53* 48* 39*  CREATININE 7.01* 4.74* 4.69* 3.87* 3.23* 2.91* 2.84*  CALCIUM 8.6* 8.3* 8.6* 8.4* 8.9 8.8* 9.0  PHOS 9.6*  --   --   --   --   --   --    CBC Recent Labs  Lab 02/04/19 0254 02/05/19 0455 02/06/19 0717 02/07/19 0858  WBC 14.2* 14.9* 15.0* 12.6*  NEUTROABS 11.0* 11.2* 11.8* 9.9*  HGB 10.6* 11.2* 10.1* 10.1*  HCT 33.6* 36.9* 32.0* 32.5*  MCV 90.3 93.9 92.2 93.1  PLT 295 407* 345 328    Medications:    . acetaminophen  1,000 mg Oral Q8H  . amLODipine  5 mg Oral Daily  . Chlorhexidine Gluconate  Cloth  6 each Topical Daily  . DULoxetine  60 mg Oral Daily  . insulin aspart  0-5 Units Subcutaneous QHS  . insulin aspart  0-9 Units Subcutaneous TID WC  . insulin glargine  20 Units Subcutaneous Q2200  . mouth rinse  15 mL Mouth Rinse BID  . methocarbamol  500 mg Oral TID  . metoprolol succinate  100 mg Oral Daily  . pantoprazole  40 mg Oral Daily  . simethicone  80 mg Oral QID  . sodium chloride flush  10-40 mL Intracatheter Q12H  . thiamine  100 mg Oral Daily  . traZODone  300 mg Oral QHS    Claudia Desanctis 02/07/2019, 3:07 PM

## 2019-02-07 NOTE — Interval H&P Note (Signed)
History and Physical Interval Note:  02/07/2019 12:04 PM  Phillip Maldonado  has presented today for surgery, with the diagnosis of bile duct leak.  The various methods of treatment have been discussed with the patient and family. After consideration of risks, benefits and other options for treatment, the patient has consented to  Procedure(s): ENDOSCOPIC RETROGRADE CHOLANGIOPANCREATOGRAPHY (ERCP) (N/A) as a surgical intervention.  The patient's history has been reviewed, patient examined, no change in status, stable for surgery.  I have reviewed the patient's chart and labs.  Questions were answered to the patient's satisfaction.     Pricilla Riffle. Fuller Plan

## 2019-02-07 NOTE — Progress Notes (Signed)
Notified Kyere, NP that pt's gauze around JP drain to right upper quadrant is saturated with bile drainage. Changed gauze to site. JP drain is still draining bile. Emptied 75cc of bile drainage. Will continue to monitor pt. Ranelle Oyster, RN

## 2019-02-07 NOTE — Progress Notes (Addendum)
Patient back from Endo. Alert and oriented x 4; no complaints of pain or other distress. JP drain in place draining about 60 ml. VS stable; wife at bedside. Will continue to monitor.

## 2019-02-07 NOTE — Transfer of Care (Signed)
Immediate Anesthesia Transfer of Care Note  Patient: Phillip Maldonado  Procedure(s) Performed: ENDOSCOPIC RETROGRADE CHOLANGIOPANCREATOGRAPHY (ERCP) (N/A ) BILIARY STENT PLACEMENT  Patient Location: Endoscopy Unit  Anesthesia Type:General  Level of Consciousness: drowsy and patient cooperative  Airway & Oxygen Therapy: Patient Spontanous Breathing and Patient connected to nasal cannula oxygen  Post-op Assessment: Report given to RN and Post -op Vital signs reviewed and stable  Post vital signs: Reviewed and stable  Last Vitals:  Vitals Value Taken Time  BP    Temp    Pulse    Resp    SpO2      Last Pain:  Vitals:   02/07/19 1129  TempSrc: Temporal  PainSc: 0-No pain      Patients Stated Pain Goal: 4 ([id number])  Complications: No apparent anesthesia complications

## 2019-02-07 NOTE — Progress Notes (Signed)
Notified GI doctor with [facility] oncall doctor that pt having bile drainage from around JP drain saturating the gauze with bile.JP drain is still draining bile too. Pt is scheduled for ERCP this am. MD stated that the ERCP was the treatment for this. MD gave no new orders. Will continue to monitor pt. Hassan Buckler

## 2019-02-07 NOTE — Anesthesia Preprocedure Evaluation (Addendum)
Anesthesia Evaluation  Patient identified by MRN, date of birth, ID band Patient awake    Reviewed: Allergy & Precautions, NPO status , Patient's Chart, lab work & pertinent test results  Airway Mallampati: III  TM Distance: >3 FB Neck ROM: Full    Dental  (+) Missing, Poor Dentition   Pulmonary sleep apnea ,    Pulmonary exam normal breath sounds clear to auscultation       Cardiovascular hypertension, Pt. on medications and Pt. on home beta blockers + Peripheral Vascular Disease and +CHF  Normal cardiovascular exam+ dysrhythmias Atrial Fibrillation  Rhythm:Regular Rate:Normal  ECG: rate 84.  Normal sinus rhythm Left axis deviation Right bundle branch block Left ventricular hypertrophy  ECHO: 1. Left ventricular ejection fraction, by visual estimation, is 60 to 65%. The left ventricle has normal function. There is no left ventricular hypertrophy. 2. Left ventricular diastolic parameters are consistent with Grade I diastolic dysfunction (impaired relaxation). 3. Global right ventricle has normal systolic function.The right ventricular size is normal. No increase in right ventricular wall thickness. 4. Left atrial size was mildly dilated. 5. Right atrial size was normal. 6. Moderate mitral annular calcification. 7. The mitral valve is normal in structure. No evidence of mitral valve regurgitation. No evidence of mitral stenosis. 8. The tricuspid valve is normal in structure. Tricuspid valve regurgitation is not demonstrated. 9. The aortic valve is tricuspid. Aortic valve regurgitation is not visualized. Mild aortic valve sclerosis without stenosis. 10. The pulmonic valve was normal in structure. Pulmonic valve regurgitation is not visualized. 11. The inferior vena cava is normal in size with greater than 50% respiratory variability, suggesting right atrial pressure of 3 mmHg. 12. The interatrial septum was not well  visualized.   Neuro/Psych  Headaches, PSYCHIATRIC DISORDERS Anxiety Depression  Neuromuscular disease    GI/Hepatic negative GI ROS, (+) Hepatitis -  Endo/Other  diabetes, Oral Hypoglycemic Agents, Insulin DependentMorbid obesity  Renal/GU CRFRenal disease     Musculoskeletal  (+) Arthritis ,   Abdominal (+) + obese,   Peds  Hematology  (+) anemia , HLD   Anesthesia Other Findings bile duct leak  Reproductive/Obstetrics                            Anesthesia Physical Anesthesia Plan  ASA: III  Anesthesia Plan: General   Post-op Pain Management:    Induction: Intravenous  PONV Risk Score and Plan: 2 and Treatment may vary due to age or medical condition, Ondansetron and Dexamethasone  Airway Management Planned: Oral ETT  Additional Equipment:   Intra-op Plan:   Post-operative Plan: Extubation in OR  Informed Consent: I have reviewed the patients History and Physical, chart, labs and discussed the procedure including the risks, benefits and alternatives for the proposed anesthesia with the patient or authorized representative who has indicated his/her understanding and acceptance.     Dental advisory given  Plan Discussed with: CRNA  Anesthesia Plan Comments:        Anesthesia Quick Evaluation

## 2019-02-07 NOTE — Progress Notes (Signed)
[facility] Progress Note  5 Days Post-Op  Subjective: CC: pain Patient reports pain in RUQ and leakage from drain site. Seems tired and overall just doesn't feel well.   Objective: Vital signs in last 24 hours: Temp:  [98.2 F (36.8 C)-98.9 F (37.2 C)] 98.9 F (37.2 C) (11/24 0541) Pulse Rate:  [73-79] 73 (11/24 0541) Resp:  [16-18] 18 (11/24 0541) BP: (150-161)/(68-88) 150/68 (11/24 0541) SpO2:  [97 %-99 %] 99 % (11/24 0541) Weight:  [150.1 kg] 150.1 kg (11/24 0542) Last BM Date: 02/04/19  Intake/Output from previous day: 11/23 0701 - 11/24 0700 In: 746.6 [P.O.:480; IV Piggyback:266.6] Out: 2580 [Urine:2125; Drains:455] Intake/Output this shift: No intake/output data recorded.  PE: Gen:  Lethargic, arouses easily Card:  Regular rate and rhythm Pulm:  Normal effort, clear to auscultation bilaterally Abd: Soft, appropriately ttp, non-distended, +BS. Incisions c/d/i with staples present, drain in RUQ with frank bile  Skin: warm and dry, no rashes  Psych: A&Ox3   Lab Results:  Recent Labs    02/06/19 0717 02/07/19 0858  WBC 15.0* 12.6*  HGB 10.1* 10.1*  HCT 32.0* 32.5*  PLT 345 328   BMET Recent Labs    02/05/19 0455 02/06/19 0717  NA 140 139  K 3.9 3.7  CL 107 107  CO2 23 21*  GLUCOSE 155* 156*  BUN 53* 48*  CREATININE 3.23* 2.91*  CALCIUM 8.9 8.8*   PT/INR No results for input(s): LABPROT, INR in the last 72 hours. CMP     Component Value Date/Time   NA 139 02/06/2019 0717   NA 139 02/12/2015 1424   K 3.7 02/06/2019 0717   CL 107 02/06/2019 0717   CO2 21 (L) 02/06/2019 0717   GLUCOSE 156 (H) 02/06/2019 0717   BUN 48 (H) 02/06/2019 0717   BUN 15 02/12/2015 1424   CREATININE 2.91 (H) 02/06/2019 0717   CREATININE 1.14 10/01/2016 1504   CALCIUM 8.8 (L) 02/06/2019 0717   PROT 6.5 02/06/2019 0717   PROT 6.7 10/11/2014 0833   ALBUMIN 2.4 (L) 02/06/2019 0717   ALBUMIN 3.8 10/11/2014 0833   AST 35 02/06/2019 0717   ALT 23  02/06/2019 0717   ALKPHOS 86 02/06/2019 0717   BILITOT 1.0 02/06/2019 0717   BILITOT 0.4 10/11/2014 0833   GFRNONAA 21 (L) 02/06/2019 0717   GFRNONAA 67 10/01/2016 1504   GFRAA 25 (L) 02/06/2019 0717   GFRAA 78 10/01/2016 1504   Lipase     Component Value Date/Time   LIPASE 38 01/24/2019 0150       Studies/Results: No results found.  Anti-infectives: Anti-infectives (From admission, onward)   Start     Dose/Rate Route Frequency Ordered Stop   01/31/19 2200  Ampicillin-Sulbactam (UNASYN) 3 g in sodium chloride 0.9 % 100 mL IVPB     3 g 200 mL/hr over 30 Minutes Intravenous Every 12 hours 01/31/19 1051     01/31/19 1000  Ampicillin-Sulbactam (UNASYN) 3 g in sodium chloride 0.9 % 100 mL IVPB  Status:  Discontinued     3 g 200 mL/hr over 30 Minutes Intravenous Every 24 hours 01/31/19 0859 01/31/19 1051   01/23/19 1400  piperacillin-tazobactam (ZOSYN) IVPB 2.25 g  Status:  Discontinued     2.25 g 100 mL/hr over 30 Minutes Intravenous Every 8 hours 01/23/19 0717 01/23/19 0947   01/23/19 1400  cefTRIAXone (ROCEPHIN) 2 g in sodium chloride 0.9 % 100 mL IVPB  Status:  Discontinued     2 g 200 mL/hr over  30 Minutes Intravenous Every 24 hours 01/23/19 0947 01/31/19 0857   01/23/19 1400  metroNIDAZOLE (FLAGYL) IVPB 500 mg  Status:  Discontinued     500 mg 100 mL/hr over 60 Minutes Intravenous Every 8 hours 01/23/19 0947 01/31/19 0857   01/21/19 1400  piperacillin-tazobactam (ZOSYN) IVPB 3.375 g  Status:  Discontinued     3.375 g 12.5 mL/hr over 240 Minutes Intravenous Every 8 hours 01/21/19 1325 01/23/19 0717       Assessment/Plan Sepsis/shock Respiratory failure with hypoxia Elevated LFTs-LFTs and Tbili all normalized on 11/13 Uncontrolled type 2 diabetes with diabetic neuropathy Hypertension GERD/Hx GI bleed AKI: intermittent HD per renal Hx AF Hx alcoholism/Gastric bypass Morbid obesity BMI 53.8  Pancreatitis, abdominal pain, cholelithiasis and jaundice S/P  laparoscopic converted to open cholecystectomy, Dr. Ninfa Linden, 11/19 - POD#5 - WBC 12, afebrile - drain is frankly bilious, 455 cc out in last 24h - GI taking for ERCP today  - tolerating diet and having bowel function - continue to mobilize as able  FEN:CM diet [id number] 11/7-11/9; Rocephin/Flagyl 11/9-11/17; Unasyn 11/17>> [id number] Follow up: TBD  LOS: 18 days    Brigid Re , [facility] 02/07/2019, 10:08 AM Please see Amion for pager number during day hours 7:00am-4:30pm

## 2019-02-07 NOTE — Anesthesia Postprocedure Evaluation (Signed)
Anesthesia Post Note  Patient: Phillip Maldonado  Procedure(s) Performed: ENDOSCOPIC RETROGRADE CHOLANGIOPANCREATOGRAPHY (ERCP) (N/A ) BILIARY STENT PLACEMENT     Patient location during evaluation: Endoscopy Anesthesia Type: General Level of consciousness: awake and alert Pain management: pain level controlled Vital Signs Assessment: post-procedure vital signs reviewed and stable Respiratory status: spontaneous breathing, nonlabored ventilation, respiratory function stable and patient connected to nasal cannula oxygen Cardiovascular status: blood pressure returned to baseline and stable Postop Assessment: no apparent nausea or vomiting Anesthetic complications: no    Last Vitals:  Vitals:   02/07/19 1355 02/07/19 1434  BP: (!) 151/79 (!) 141/82  Pulse: 72 75  Resp: 20 20  Temp:  36.8 C  SpO2: 98% 96%    Last Pain:  Vitals:   02/07/19 1434  TempSrc: Oral  PainSc: 0-No pain                 Ryan P Ellender

## 2019-02-07 NOTE — Progress Notes (Signed)
PT Cancellation Note  Patient Details Name: Phillip Maldonado MRN: [id number] DOB: 1951/01/16   Cancelled Treatment:    Reason Eval/Treat Not Completed: Patient declined, no reason specified Patient declined participating in therapy at this time despite encouragement. PT will continue to follow acutely.   Earney Navy, PTA Acute Rehabilitation Services Pager: [phone] Office: [phone]   02/07/2019, 9:11 AM

## 2019-02-07 NOTE — Anesthesia Procedure Notes (Signed)
Procedure Name: Intubation Date/Time: 02/07/2019 12:10 PM Performed by: Moshe Salisbury, CRNA Pre-anesthesia Checklist: Patient identified, Emergency Drugs available, Suction available and Patient being monitored Patient Re-evaluated:Patient Re-evaluated prior to induction Oxygen Delivery Method: Circle System Utilized Preoxygenation: Pre-oxygenation with 100% oxygen Induction Type: IV induction Ventilation: Mask ventilation with difficulty and Oral airway inserted - appropriate to patient size Laryngoscope Size: Mac and 4 Grade View: Grade I Tube type: Oral Tube size: 7.5 mm Number of attempts: 1 Airway Equipment and Method: Stylet Placement Confirmation: ETT inserted through vocal cords under direct vision,  positive ETCO2 and breath sounds checked- equal and bilateral Secured at: 22 cm Tube secured with: Tape Dental Injury: Teeth and Oropharynx as per pre-operative assessment

## 2019-02-07 NOTE — Progress Notes (Addendum)
Initial Nutrition Assessment  DOCUMENTATION CODES:   Morbid obesity  INTERVENTION:  Boost Breeze TID (each supplement provides 250 calories and 9 grams of protein)  MVI daily  NUTRITION DIAGNOSIS:   Increased nutrient needs related to acute illness(pancreatitis) as evidenced by estimated needs   GOAL:   Patient will meet greater than or equal to 90% of their needs  MONITOR:   PO intake, Supplement acceptance, Labs, Weight trends, Skin, I & O's  REASON FOR ASSESSMENT:   NPO/Clear Liquid Diet    ASSESSMENT:   68 y.o. 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 fib, CHF, DM type 2, fatty liver, obesity, chronic abdominal pain presenting with acute onset of epigastric pain radiating to the right upper chest, admitted for acute pancreatitis.  Pt s/p ERCp on 11/24. Pt was lethargic from procedure, hx obtained from pt and wife. Patient reports being apprehensive to eat since admission because of post prandial abdominal pain, reports to eating "very little" since being in the hospital. Appetite was good PTA. Typical meal pattern: Breakfast- grits and eggs; Lunch- sandwich or some other "lunch food"; Dinner- spaghetti or other food cooked at home. Wife admits to doing most of the cooking at home and states "that's going to change now with what he's allowed to eat." Pt and wife were educated on choosing foods lower in fat post cholecystectomy, and encouraged to have pt continue taking MVI daily. Pt and wife were interested in supplementation suggested by RD and understood importance of maintaining calorie and protein intake while inpatient. Per documented weight history, weights are trending downward, RD will continue to monitor.  Labs and medications reviewed.   NUTRITION - FOCUSED PHYSICAL EXAM:    Most Recent Value  Orbital Region  No depletion  Upper Arm Region  No depletion  Thoracic and Lumbar Region  No  depletion  Buccal Region  No depletion  Temple Region  No depletion  Clavicle Bone Region  No depletion  Clavicle and Acromion Bone Region  No depletion  Scapular Bone Region  No depletion  Dorsal Hand  No depletion  Patellar Region  No depletion  Anterior Thigh Region  No depletion  Posterior Calf Region  No depletion  Edema (RD Assessment)  Mild  Hair  Reviewed  Eyes  Reviewed  Mouth  Reviewed  Skin  Reviewed  Nails  Reviewed       Diet Order:   Diet Order            Diet clear liquid Room service appropriate? No; Fluid consistency: Thin  Diet effective now              EDUCATION NEEDS:   Education needs have been addressed  Skin:  Skin Assessment: Reviewed RN Assessment  Last BM:  11/23  Height:   Ht Readings from Last 1 Encounters:  02/07/19 5\' 9"  (1.753 m)    Weight:   Wt Readings from Last 1 Encounters:  02/07/19 (!) 150.1 kg    Ideal Body Weight:  72.7 kg  BMI:  Body mass index is 48.87 kg/m.  Estimated Nutritional Needs:   Kcal:  2100-2300  Protein:  115-125 grams  Fluid:  >/= 2 L  Meda Klinefelter, Dietetic Intern

## 2019-02-07 NOTE — Progress Notes (Signed)
Patient going to Endo. Alert and oriented x 4; no acute distress noted; VS stable.

## 2019-02-07 NOTE — Progress Notes (Signed)
PHARMACY NOTE:  ANTIMICROBIAL RENAL DOSAGE ADJUSTMENT  Current antimicrobial regimen includes a mismatch between antimicrobial dosage and estimated renal function.  As per policy approved by the Pharmacy & Therapeutics and Medical Executive Committees, the antimicrobial dosage will be adjusted accordingly.  Current antimicrobial dosage:  Unasyn 3gm IV q12h  Indication: Acute gallstone pancreatitis  Renal Function:   Estimated Creatinine Clearance: 36.6 mL/min (A) (by C-G formula based on SCr of 2.84 mg/dL (H)).     Antimicrobial dosage has been changed to:  Unasyn 3gm IV q8h  Additional comments:   Thank you for allowing pharmacy to be a part of this patient's care.  Hildred Laser, PharmD Clinical Pharmacist **Pharmacist phone directory can now be found on [email or web address] ([email or web address]).  Listed under [place].

## 2019-02-08 ENCOUNTER — Encounter (HOSPITAL_COMMUNITY): Payer: Self-pay | Admitting: Gastroenterology

## 2019-02-08 LAB — CBC WITH DIFFERENTIAL/PLATELET
Abs Immature Granulocytes: 0.09 10*3/uL — ABNORMAL HIGH (ref 0.00–0.07)
Basophils Absolute: 0.1 10*3/uL (ref 0.0–0.1)
Basophils Relative: 0 %
Eosinophils Absolute: 0.2 10*3/uL (ref 0.0–0.5)
Eosinophils Relative: 2 %
HCT: 31.5 % — ABNORMAL LOW (ref 39.0–52.0)
Hemoglobin: 10 g/dL — ABNORMAL LOW (ref 13.0–17.0)
Immature Granulocytes: 1 %
Lymphocytes Relative: 12 %
Lymphs Abs: 1.5 10*3/uL (ref 0.7–4.0)
MCH: 28.7 pg (ref 26.0–34.0)
MCHC: 31.7 g/dL (ref 30.0–36.0)
MCV: 90.5 fL (ref 80.0–100.0)
Monocytes Absolute: 0.7 10*3/uL (ref 0.1–1.0)
Monocytes Relative: 6 %
Neutro Abs: 10.1 10*3/uL — ABNORMAL HIGH (ref 1.7–7.7)
Neutrophils Relative %: 79 %
Platelets: 267 10*3/uL (ref 150–400)
RBC: 3.48 MIL/uL — ABNORMAL LOW (ref 4.22–5.81)
RDW: 15.8 % — ABNORMAL HIGH (ref 11.5–15.5)
WBC: 12.7 10*3/uL — ABNORMAL HIGH (ref 4.0–10.5)
nRBC: 0 % (ref 0.0–0.2)

## 2019-02-08 LAB — GLUCOSE, CAPILLARY
Glucose-Capillary: 145 mg/dL — ABNORMAL HIGH (ref 70–99)
Glucose-Capillary: 122 mg/dL — ABNORMAL HIGH (ref 70–99)
Glucose-Capillary: 196 mg/dL — ABNORMAL HIGH (ref 70–99)
Glucose-Capillary: 154 mg/dL — ABNORMAL HIGH (ref 70–99)

## 2019-02-08 LAB — COMPREHENSIVE METABOLIC PANEL
ALT: 37 U/L (ref 0–44)
AST: 37 U/L (ref 15–41)
Albumin: 2.3 g/dL — ABNORMAL LOW (ref 3.5–5.0)
Alkaline Phosphatase: 92 U/L (ref 38–126)
Anion gap: 11 (ref 5–15)
BUN: 32 mg/dL — ABNORMAL HIGH (ref 8–23)
CO2: 21 mmol/L — ABNORMAL LOW (ref 22–32)
Calcium: 8.5 mg/dL — ABNORMAL LOW (ref 8.9–10.3)
Chloride: 111 mmol/L (ref 98–111)
Creatinine, Ser: 2.09 mg/dL — ABNORMAL HIGH (ref 0.61–1.24)
GFR calc Af Amer: 37 mL/min — ABNORMAL LOW (ref >60)
GFR calc non Af Amer: 32 mL/min — ABNORMAL LOW (ref >60)
Glucose, Bld: 146 mg/dL — ABNORMAL HIGH (ref 70–99)
Potassium: 3.2 mmol/L — ABNORMAL LOW (ref 3.5–5.1)
Sodium: 143 mmol/L (ref 135–145)
Total Bilirubin: 0.8 mg/dL (ref 0.3–1.2)
Total Protein: 6.7 g/dL (ref 6.5–8.1)

## 2019-02-08 LAB — MAGNESIUM: Magnesium: 1.6 mg/dL — ABNORMAL LOW (ref 1.7–2.4)

## 2019-02-08 LAB — LIPASE, BLOOD: Lipase: 878 U/L — ABNORMAL HIGH (ref 11–51)

## 2019-02-08 MED ORDER — MORPHINE SULFATE (PF) 2 MG/ML IV SOLN
2.0000 mg | Freq: Once | INTRAVENOUS | Status: AC
  Administered 2019-02-08: 2 mg via INTRAVENOUS
  Filled 2019-02-08: qty 1

## 2019-02-08 MED ORDER — MORPHINE SULFATE (PF) 2 MG/ML IV SOLN
2.0000 mg | INTRAVENOUS | Status: DC | PRN
  Administered 2019-02-08 – 2019-02-10 (×8): 2 mg via INTRAVENOUS
  Filled 2019-02-08 (×8): qty 1

## 2019-02-08 MED ORDER — OXYCODONE HCL 5 MG PO TABS
5.0000 mg | ORAL_TABLET | ORAL | Status: DC | PRN
  Administered 2019-02-08 – 2019-02-10 (×5): 10 mg via ORAL
  Filled 2019-02-08 (×5): qty 2

## 2019-02-08 MED ORDER — POTASSIUM CHLORIDE CRYS ER 20 MEQ PO TBCR
40.0000 meq | EXTENDED_RELEASE_TABLET | ORAL | Status: DC
  Administered 2019-02-08: 15:00:00 40 meq via ORAL
  Filled 2019-02-08: qty 2

## 2019-02-08 MED ORDER — MORPHINE SULFATE (PF) 2 MG/ML IV SOLN
INTRAVENOUS | Status: AC
  Administered 2019-02-08: 2 mg via INTRAVENOUS
  Filled 2019-02-08: qty 1

## 2019-02-08 MED ORDER — METHOCARBAMOL 750 MG PO TABS
750.0000 mg | ORAL_TABLET | Freq: Three times a day (TID) | ORAL | Status: DC
  Administered 2019-02-08 – 2019-02-10 (×7): 750 mg via ORAL
  Filled 2019-02-08 (×7): qty 1

## 2019-02-08 MED ORDER — FUROSEMIDE 10 MG/ML IJ SOLN
40.0000 mg | Freq: Once | INTRAMUSCULAR | Status: AC
  Administered 2019-02-08: 11:00:00 40 mg via INTRAVENOUS
  Filled 2019-02-08: qty 4

## 2019-02-08 MED ORDER — MAGNESIUM SULFATE 2 GM/50ML IV SOLN
2.0000 g | Freq: Once | INTRAVENOUS | Status: AC
  Administered 2019-02-08: 15:00:00 2 g via INTRAVENOUS
  Filled 2019-02-08: qty 50

## 2019-02-08 MED ORDER — MORPHINE SULFATE (PF) 2 MG/ML IV SOLN: 2.0000 mg | INTRAVENOUS | Status: DC | PRN

## 2019-02-08 NOTE — Progress Notes (Signed)
Nutrition Follow-up  DOCUMENTATION CODES:   Morbid obesity  INTERVENTION:  Continue Boost Breeze TID (each supplement provides 250 calories and 9 grams of protein)  Continue MVI daily  NUTRITION DIAGNOSIS:   Increased nutrient needs related to acute illness(pancreatitis) as evidenced by estimated needs; ongoing  GOAL:   Patient will meet greater than or equal to 90% of their needs- progressing  MONITOR:   PO intake, Supplement acceptance, Labs, Weight trends, Skin, I & O's  REASON FOR ASSESSMENT:   NPO/Clear Liquid Diet    ASSESSMENT:   68 y.o. 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 fib, CHF, DM type 2, fatty liver, obesity, chronic abdominal pain presenting with acute onset of epigastric pain radiating to the right upper chest, admitted for acute pancreatitis.  Pt was experiencing significant pain in RUQ at time of assessment. States that he is afraid to eat or drink anything because he experiences pain. Had an episode of bowel incontinence overnight and reported feeling better afterwards. Received pain med during visit, and stated that he was beginning to feel relief. Encouraged pt to drink boost breeze supplements over ice to increase palatability, and to try and drink supplement after administration of pain medication to reduce instance of pain postprandially. Pt and wife also mentioned that they have been putting a white powder fiber/protein supplement in his coffee, which they could not remember the name of.  Labs and Medications reviewed.    Diet Order:   Diet Order            Diet clear liquid Room service appropriate? No; Fluid consistency: Thin  Diet effective now              EDUCATION NEEDS:   Education needs have been addressed  Skin:  Skin Assessment: Reviewed RN Assessment  Last BM:  11/25  Height:   Ht Readings from Last 1 Encounters:  02/07/19 5\' 9"  (1.753 m)     Weight:   Wt Readings from Last 1 Encounters:  02/07/19 (!) 150.1 kg    Ideal Body Weight:  72.7 kg  BMI:  Body mass index is 48.87 kg/m.  Estimated Nutritional Needs:   Kcal:  2100-2300  Protein:  115-125 grams  Fluid:  >/= 2 L  Meda Klinefelter, Dietetic Intern

## 2019-02-08 NOTE — Progress Notes (Addendum)
[facility] Progress Note  1 Day Post-Op  Subjective: CC-  Abdominal pain worse after ERCP yesterday. Pain is mostly in upper abdomen. Denies n/v. No appetite. Incontinent of loose stool over night. TMAX 99.5, VSS Labs pending  Objective: Vital signs in last 24 hours: Temp:  [97.4 F (36.3 C)-99.5 F (37.5 C)] 99.5 F (37.5 C) (11/25 0810) Pulse Rate:  [66-85] 66 (11/25 0810) Resp:  [17-23] 20 (11/25 0810) BP: (131-158)/(57-84) 131/63 (11/25 0810) SpO2:  [94 %-99 %] 97 % (11/25 0810) Weight:  [150.1 kg] 150.1 kg (11/24 1129) Last BM Date: 02/04/19  Intake/Output from previous day: 11/24 0701 - 11/25 0700 In: 1295 [P.O.:420; I.V.:600; IV Piggyback:200] Out: 2170 [Urine:2030; Drains:140] Intake/Output this shift: Total I/O In: -  Out: 300 [Urine:200; Drains:100]  PE: [id number], NAD, drowsy Card: Regular rate and rhythm Pulm: Normal rate and effort, clear to auscultation bilaterally Abd: Soft,appropriately ttp in RUQ and around drain, non-distended,+BS. Incisions c/d/i with staples intact and no erythema or drainage, drain in RUQ with frank bile Skin: warm and dry, face flushed  Psych: A&Ox3   Lab Results:  Recent Labs    02/06/19 0717 02/07/19 0858  WBC 15.0* 12.6*  HGB 10.1* 10.1*  HCT 32.0* 32.5*  PLT 345 328   BMET Recent Labs    02/06/19 0717 02/07/19 0858  NA 139 143  K 3.7 3.1*  CL 107 110  CO2 21* 21*  GLUCOSE 156* 142*  BUN 48* 39*  CREATININE 2.91* 2.84*  CALCIUM 8.8* 9.0   PT/INR No results for input(s): LABPROT, INR in the last 72 hours. CMP     Component Value Date/Time   NA 143 02/07/2019 0858   NA 139 02/12/2015 1424   K 3.1 (L) 02/07/2019 0858   CL 110 02/07/2019 0858   CO2 21 (L) 02/07/2019 0858   GLUCOSE 142 (H) 02/07/2019 0858   BUN 39 (H) 02/07/2019 0858   BUN 15 02/12/2015 1424   CREATININE 2.84 (H) 02/07/2019 0858   CREATININE 1.14 10/01/2016 1504   CALCIUM 9.0 02/07/2019 0858   PROT 6.9 02/07/2019  0858   PROT 6.7 10/11/2014 0833   ALBUMIN 2.5 (L) 02/07/2019 0858   ALBUMIN 3.8 10/11/2014 0833   AST 41 02/07/2019 0858   ALT 32 02/07/2019 0858   ALKPHOS 93 02/07/2019 0858   BILITOT 0.7 02/07/2019 0858   BILITOT 0.4 10/11/2014 0833   GFRNONAA 22 (L) 02/07/2019 0858   GFRNONAA 67 10/01/2016 1504   GFRAA 25 (L) 02/07/2019 0858   GFRAA 78 10/01/2016 1504   Lipase     Component Value Date/Time   LIPASE 38 01/24/2019 0150       Studies/Results: Dg Ercp  Result Date: 02/07/2019 CLINICAL DATA:  68 year old male with bile leak. EXAM: ERCP TECHNIQUE: Multiple spot images obtained with the fluoroscopic device and submitted for interpretation post-procedure. FLUOROSCOPY TIME:  Fluoroscopy Time:  1 minutes 59 seconds COMPARISON:  None. FINDINGS: A total of 8 intraoperative saved images are submitted for review. The images demonstrate a flexible endoscope in the descending duodenum with wire cannulation of the common bile duct. Cholangiogram was subsequently performed. There is extravasation of contrast material from the region of the cystic duct stump into the peritoneal space adjacent to a surgical drain. On the final images, a plastic biliary stent has been placed. IMPRESSION: 1. Positive for bile leak. 2. Placement of a plastic biliary stent. These images were submitted for radiologic interpretation only. Please see the procedural report for the amount of  contrast and the fluoroscopy time utilized. Electronically Signed   By: Jacqulynn Cadet M.D.   On: 02/07/2019 13:32    Anti-infectives: Anti-infectives (From admission, onward)   Start     Dose/Rate Route Frequency Ordered Stop   02/08/19 0022  Ampicillin-Sulbactam (UNASYN) 3 g in sodium chloride 0.9 % 100 mL IVPB     3 g 200 mL/hr over 30 Minutes Intravenous Every 8 hours 02/07/19 1712     01/31/19 2200  Ampicillin-Sulbactam (UNASYN) 3 g in sodium chloride 0.9 % 100 mL IVPB  Status:  Discontinued     3 g 200 mL/hr over 30 Minutes  Intravenous Every 12 hours 01/31/19 1051 02/07/19 1712   01/31/19 1000  Ampicillin-Sulbactam (UNASYN) 3 g in sodium chloride 0.9 % 100 mL IVPB  Status:  Discontinued     3 g 200 mL/hr over 30 Minutes Intravenous Every 24 hours 01/31/19 0859 01/31/19 1051   01/23/19 1400  piperacillin-tazobactam (ZOSYN) IVPB 2.25 g  Status:  Discontinued     2.25 g 100 mL/hr over 30 Minutes Intravenous Every 8 hours 01/23/19 0717 01/23/19 0947   01/23/19 1400  cefTRIAXone (ROCEPHIN) 2 g in sodium chloride 0.9 % 100 mL IVPB  Status:  Discontinued     2 g 200 mL/hr over 30 Minutes Intravenous Every 24 hours 01/23/19 0947 01/31/19 0857   01/23/19 1400  metroNIDAZOLE (FLAGYL) IVPB 500 mg  Status:  Discontinued     500 mg 100 mL/hr over 60 Minutes Intravenous Every 8 hours 01/23/19 0947 01/31/19 0857   01/21/19 1400  piperacillin-tazobactam (ZOSYN) IVPB 3.375 g  Status:  Discontinued     3.375 g 12.5 mL/hr over 240 Minutes Intravenous Every 8 hours 01/21/19 1325 01/23/19 0717       Assessment/Plan Sepsis/shock Respiratory failure with hypoxia Elevated LFTs-LFTs and Tbili all normalized on 11/13 Uncontrolled type 2 diabetes with diabetic neuropathy Hypertension GERD/Hx GI bleed AKI: intermittent HD per renal Hx AF Hx alcoholism/Gastric bypass Morbid obesity BMI 53.8  Pancreatitis, abdominal pain, cholelithiasis and jaundice S/P laparoscopic converted to open cholecystectomy, Dr. Ninfa Linden, 11/19 -POD#6 - s/p ERCP 11/24 stent placement for cystic duct leak - drain output remains high volume and bilious - TMAX 99.5, labs pending - continue clear liquids, encourage mobilization - increase robaxin to 750mg  TID, oxy 5-10 q4 hours, and morphine for breakthrough pain  FEN:CLD [id number] 11/7-11/9; Rocephin/Flagyl 11/9-11/17; Unasyn 11/17>> [id number] Foley: in place Follow up:  Dr. Ninfa Linden, GI   LOS: 19 days    Wellington Hampshire, [facility] 02/08/2019, 9:12 AM Please  see Amion for pager number during day hours 7:00am-4:30pm

## 2019-02-08 NOTE — Progress Notes (Addendum)
Daily Rounding Note  02/08/2019, 12:51 PM  LOS: 19 days   SUBJECTIVE:   Chief complaint:   Bile leak post open cholecystectomy. Still having pain in his upper abdomen especially on the right.  The bile drain started leaking last night.   Drain output 455 mL 2 days ago, 140 mL yesterday, 100 mL so far today. Large, incontinent soft/liquid stool last night.  Smaller soft brown stool this morning. Nauseated, does not throw up.  Able to eat a small amount of clear liquid trays.  OBJECTIVE:         Vital signs in last 24 hours:    Temp:  [97.4 F (36.3 C)-99.5 F (37.5 C)] 99.5 F (37.5 C) (11/25 0810) Pulse Rate:  [66-85] 66 (11/25 0810) Resp:  [18-23] 20 (11/25 0810) BP: (131-155)/(57-82) 131/63 (11/25 0810) SpO2:  [94 %-99 %] 97 % (11/25 0810) Last BM Date: 02/08/19 Filed Weights   02/06/19 0520 02/07/19 0542 02/07/19 1129  Weight: (!) 153 kg (!) 150.1 kg (!) 150.1 kg   General: Morbidly obese, looks acutely and chronically unwell. Heart: RRR Chest: Diminished breath sounds but clear bilaterally. Abdomen: Obese.  Soft.  Active bowel sounds.  Dark bile in drain. Extremities: Nonpitting lower extremity swelling. Neuro/Psych: Alert.  Oriented x3.  Intake/Output from previous day: 11/24 0701 - 11/25 0700 In: 1295 [P.O.:420; I.V.:600; IV Piggyback:200] Out: 2170 [Urine:2030; Drains:140]  Intake/Output this shift: Total I/O In: 200 [P.O.:200] Out: 1000 [Urine:900; Drains:100]  Lab Results: Recent Labs    02/06/19 0717 02/07/19 0858 02/08/19 1051  WBC 15.0* 12.6* 12.7*  HGB 10.1* 10.1* 10.0*  HCT 32.0* 32.5* 31.5*  PLT 345 328 267   BMET Recent Labs    02/06/19 0717 02/07/19 0858 02/08/19 1051  NA 139 143 143  K 3.7 3.1* 3.2*  CL 107 110 111  CO2 21* 21* 21*  GLUCOSE 156* 142* 146*  BUN 48* 39* 32*  CREATININE 2.91* 2.84* 2.09*  CALCIUM 8.8* 9.0 8.5*   LFT Recent Labs    02/06/19 0717  02/07/19 0858 02/08/19 1051  PROT 6.5 6.9 6.7  ALBUMIN 2.4* 2.5* 2.3*  AST 35 41 37  ALT 23 32 37  ALKPHOS 86 93 92  BILITOT 1.0 0.7 0.8   PT/INR No results for input(s): LABPROT, INR in the last 72 hours. Hepatitis Panel No results for input(s): HEPBSAG, HCVAB, HEPAIGM, HEPBIGM in the last 72 hours.  Studies/Results: Dg Ercp  Result Date: 02/07/2019 CLINICAL DATA:  68 year old male with bile leak. EXAM: ERCP TECHNIQUE: Multiple spot images obtained with the fluoroscopic device and submitted for interpretation post-procedure. FLUOROSCOPY TIME:  Fluoroscopy Time:  1 minutes 59 seconds COMPARISON:  None. FINDINGS: A total of 8 intraoperative saved images are submitted for review. The images demonstrate a flexible endoscope in the descending duodenum with wire cannulation of the common bile duct. Cholangiogram was subsequently performed. There is extravasation of contrast material from the region of the cystic duct stump into the peritoneal space adjacent to a surgical drain. On the final images, a plastic biliary stent has been placed. IMPRESSION: 1. Positive for bile leak. 2. Placement of a plastic biliary stent. These images were submitted for radiologic interpretation only. Please see the procedural report for the amount of contrast and the fluoroscopy time utilized. Electronically Signed   By: Jacqulynn Cadet M.D.   On: 02/07/2019 13:32    ASSESMENT:   *    Bile leak.  LFTs normal.  02/02/19 open cholecystectomy.   02/07/2019 ERCP. bile leak noted at cystic duct.  8.5 Fr 7 cm plastic stent placed to CBD.  *   Roux-en Y gastric bypass  *   Septic shock due to suspected gall stone pancreatitis.  Shock resolved. On Unasyn since 11/17.  Previous Rocephin/Flagyl. Lipase 878 today, 1054 on 11/7, 38 on 11/10, raising concern for post ERCP pancreatitis.      *    AKI.  Has required temporary hemodialysis thru 11/19 but now making good urine and HD catheter removed.     PLAN   *    Will need repeat ERCP in 2 months for reassessment and likely removal of stent.  Will arrange follow-up with Dr. Carlean Purl  *   Hopefully with the stent in place, the bilious drainage will subside and surgeons can pull the JP drain.   Azucena Freed  02/08/2019, 12:51 PM Phone [phone]    Attending Physician Note   I have taken an interval history, reviewed the chart and examined the patient. I agree with the Advanced Practitioner's note, impression and recommendations.   Bile leak post cholecystectomy  ERCP with biliary stent placed yesterday and bile drainage has decreased   Trend biliary drainage - anticipate steady decrease with stent in place   ERCP with stent removal in about 2 months  GI available as needed  Lucio Edward, MD [facility]

## 2019-02-08 NOTE — Progress Notes (Signed)
[facility] Progress Note    Assessment/ Plan:   1. Acute renal failure - combination of IV contrast/ hypotension-shock w/  ARB/ACEi on board. Creat worsened and s/p Evidence of incipient recovery and is making good UOP on Lasix- however BUN up and he had some sleepiness/ abd pain/ bloating--> got last session of HD 11/18.  HD catheter is out.  S/p lasix IV on 11/22  - Continue supportive care   - Will follow up today's labs and note that daily labs are ordered - Voiding trial - remove foley catheter  - lasix 40 mg IV once on 11/25  2. Septic shock/ abd pain - resolved, suspected gallstone pancreatitis, surgery consulted--> had ccy 11/19, on Unasyn - given renal failure would recommend reducing unasyn dosing frequency to every 12 hours if ok with primary team   3. Chronic pain - per primary team   4. Depression- per primary team   5. Chronic diastolic CHF-- volume overload is improved.  Note not compliant with home torsemide - he reports taking 50 mg daily rather than 100 mg ; also reported as being on metolazone    6.  Normocytic anemia - no acute indication for PRBC's    Subjective:    Had ERCP with stent placement yesterday - had had abd pain today.  Had 2 liters UOP over 11/24.  Today's labs haven't been drawn - RN is calling lab.  States his abd feels swollen and tight   Review of systems:  Denies shortness of breath  Denies chest pain  Denies n/v but reports abd pain Drinking water   Objective:   BP 131/63 (BP Location: Right Arm)   Pulse 66   Temp 99.5 F (37.5 C) (Oral)   Resp 20   Ht 5\' 9"  (1.753 m)   Wt (!) 150.1 kg   SpO2 97%   BMI 48.87 kg/m   Intake/Output Summary (Last 24 hours) at 02/08/2019 1031 Last data filed at 02/08/2019 0920 Gross per 24 hour  Intake 1495 ml  Output 2470 ml  Net -975 ml   Weight change: 0 kg  Physical Exam: Gen: adult male in bed in NAD at rest    CVS: RRR no m/r/g Resp: clear bilaterally no c/w/r Abd:  soft, tenderness RUQ dist with Obese habitus Ext: no  Edema lower extremities Dialysis Access: has been removed   Imaging: Dg Ercp  Result Date: 02/07/2019 CLINICAL DATA:  68 year old male with bile leak. EXAM: ERCP TECHNIQUE: Multiple spot images obtained with the fluoroscopic device and submitted for interpretation post-procedure. FLUOROSCOPY TIME:  Fluoroscopy Time:  1 minutes 59 seconds COMPARISON:  None. FINDINGS: A total of 8 intraoperative saved images are submitted for review. The images demonstrate a flexible endoscope in the descending duodenum with wire cannulation of the common bile duct. Cholangiogram was subsequently performed. There is extravasation of contrast material from the region of the cystic duct stump into the peritoneal space adjacent to a surgical drain. On the final images, a plastic biliary stent has been placed. IMPRESSION: 1. Positive for bile leak. 2. Placement of a plastic biliary stent. These images were submitted for radiologic interpretation only. Please see the procedural report for the amount of contrast and the fluoroscopy time utilized. Electronically Signed   By: Jacqulynn Cadet M.D.   On: 02/07/2019 13:32    Labs: BMET Recent Labs  Lab 02/02/19 0444 02/03/19 0353 02/04/19 0254 02/05/19 0455 02/06/19 0717 02/07/19 0858  NA 139 139 139 140 139 143  K 3.6 4.0 3.7 3.9 3.7 3.1*  CL 102 103 107 107 107 110  CO2 23 22 20* 23 21* 21*  GLUCOSE 158* 185* 155* 155* 156* 142*  BUN 64* 74* 65* 53* 48* 39*  CREATININE 4.74* 4.69* 3.87* 3.23* 2.91* 2.84*  CALCIUM 8.3* 8.6* 8.4* 8.9 8.8* 9.0   CBC Recent Labs  Lab 02/04/19 0254 02/05/19 0455 02/06/19 0717 02/07/19 0858  WBC 14.2* 14.9* 15.0* 12.6*  NEUTROABS 11.0* 11.2* 11.8* 9.9*  HGB 10.6* 11.2* 10.1* 10.1*  HCT 33.6* 36.9* 32.0* 32.5*  MCV 90.3 93.9 92.2 93.1  PLT 295 407* 345 328    Medications:    . acetaminophen  1,000 mg Oral Q8H  . amLODipine  5 mg Oral Daily  . Chlorhexidine  Gluconate Cloth  6 each Topical Daily  . DULoxetine  60 mg Oral Daily  . feeding supplement  1 Container Oral TID BM  . insulin aspart  0-5 Units Subcutaneous QHS  . insulin aspart  0-9 Units Subcutaneous TID WC  . insulin glargine  20 Units Subcutaneous Q2200  . mouth rinse  15 mL Mouth Rinse BID  . methocarbamol  750 mg Oral TID  . metoprolol succinate  100 mg Oral Daily  . pantoprazole  40 mg Oral Daily  . simethicone  80 mg Oral QID  . thiamine  100 mg Oral Daily  . traZODone  300 mg Oral QHS    Claudia Desanctis 02/08/2019, 10:31 AM

## 2019-02-08 NOTE — Progress Notes (Signed)
Patient out of bed during this shift in the chair for about 3 hrs. Able to move around; able to void after Foley was removed. Will continue to monitor.

## 2019-02-08 NOTE — Progress Notes (Signed)
Foley was D/C per MD order. Will continue to monitor.

## 2019-02-08 NOTE — Progress Notes (Signed)
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 fib, chronic abdominal pain, recent treatment for upper GI bleeding presented with acute abdominal pain.  He was admitted for gallstone pancreatitis.  He was noted to have septic shock on 01/21/2019 and was transferred to ICU.  He was eventually weaned off of pressors and transferred back to [facility] service.  MRCP showed acute pancreatitis, cholelithiasis, no CBD obstruction.  General surgery was consulted.  He was also found to have worsening creatinine.  Nephrology was consulted.  He needed temporary hemodialysis.  He underwent cholecystectomy on 02/02/2019.    PT Comments    Pt initially reluctant, but ultimately agreeable to ambulation with RW a short distance in room.  Encouraged standing every 30 mins from chair for pressure relief on his bottom and ankle pumps and LAQs for circulation (due to pt prefers his legs to be dependent).  Pt's dressing and JP bulb had to be changed and emptied due to copious amount of leaking fluid.  PT will continue to follow acutely for safe mobility progression.  Encourage pt to walk twice to the door next session.   Follow Up Recommendations  Home health PT;Supervision for mobility/OOB     Equipment Recommendations  None recommended by PT    Recommendations for Other Services   NA     Precautions / Restrictions Precautions Precautions: Other (comment) Precaution Comments: JP drain, abdominal incision, bad right shoulder, bad left knee, B carpal tunnel    Mobility  Bed Mobility               General bed mobility comments: Pt was OOB in chair.  Per RN he has a spot (pressure non open) on his sacrum, encouraged him to stand every 30 mins to give his bottom some relief.    Transfers Overall transfer level: Needs assistance Equipment used: Rolling walker (2 wheeled) Transfers: Sit to/from Stand Sit to Stand: Min guard         General transfer comment: Min guard assist for safety during transition from recliner chair and BSC.   Ambulation/Gait Ambulation/Gait assistance: Min guard Gait Distance (Feet): 20 Feet Assistive device: Rolling walker (2 wheeled) Gait Pattern/deviations: Shuffle;Step-through pattern;Trunk flexed Gait velocity: decreased   General Gait Details: Pt with flexed trunk posture (pre morbid due to h/o significant spine surgery), per wife that is as straight as he gets, shuffling pattern, short distance due to nausea and pain.  Did not want to go initially, so I could not convince him to go again after seated rest.           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        Exercises General Exercises - Lower Extremity Ankle Circles/Pumps: AROM;Both;20 reps Long Arc Quad: AROM;Both;10 reps    General Comments General comments (skin integrity, edema, etc.): JP drain site continues to leak copious amount of serisanguanous fluid.  RN changed dressing during our session and empited JP bulb. Should probably check next session before gait into hallway to make sure he is not dripping down the hallway.        Pertinent Vitals/Pain Pain Assessment: Faces Faces Pain Scale: Hurts even more Pain Location: abdomen Pain Descriptors / Indicators: Discomfort;Guarding Pain Intervention(s): Limited activity within patient's tolerance;Monitored during session;Repositioned;RN gave pain meds during session           PT Goals (current goals can now be found in the care plan section) Acute Rehab PT Goals Patient Stated Goal: to get home soon Progress towards PT goals: Progressing toward goals    Frequency    Min 3X/week      PT Plan Current plan remains appropriate       AM-PAC PT "6 Clicks" Mobility   Outcome Measure  Help needed turning from your back to your side while in a flat bed without using bedrails?: A Little Help needed moving from lying on your back to sitting on the side of a flat bed without using bedrails?: A Little Help needed moving to and from a bed to a chair (including a wheelchair)?: A Little Help needed standing up from a chair using your arms (e.g., wheelchair or bedside chair)?: A Little Help needed to walk in hospital room?: A Little Help needed climbing 3-5 steps with a railing? : A Lot 6 Click Score: 17    End of Session   Activity Tolerance: Patient limited by pain Patient left: in chair;with call bell/phone within reach;with family/visitor present   PT Visit Diagnosis: Unsteadiness on feet (R26.81);Other abnormalities of gait and mobility (R26.89);Muscle weakness (generalized) (M62.81);Pain Pain - Right/Left: Right Pain - part of body: (abdomen)     Time: 1418-1503(did not charge for extra time spent building rapport/talking) PT Time Calculation (min) (ACUTE ONLY): 45 min  Charges:  $Therapeutic Activity: 8-22 mins                    Verdene Lennert, PT, DPT  Acute Rehabilitation [phone] pager #[phone] office  @ [facility]: [phone]   02/08/2019, 3:25 PM

## 2019-02-08 NOTE — Progress Notes (Signed)
Patient ID: Phillip Maldonado, male   DOB: 1950/11/03, 68 y.o.   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 fib, chronic abdominal pain, recent treatment for upper GI bleeding presented with acute abdominal pain.  He was admitted for gallstone pancreatitis.  He was noted to have septic shock on 01/21/2019 and was transferred to ICU.  He was eventually weaned off of pressors and transferred back to [facility] service.  MRCP showed acute pancreatitis, cholelithiasis, no CBD obstruction.  General surgery was consulted.  He was also found to have worsening creatinine.  Nephrology was consulted.  He needed temporary hemodialysis.  He underwent cholecystectomy on 02/02/2019.  He underwent ERCP and plastic stent placement into CBD on 02/07/2019 for bile leak.  Assessment & Plan:   Sepsis/septic shock: Present on admission Acute cholecystitis Acute gallstone pancreatitis -Initially required ICU care and vasopressors.  After blood pressure improved, he was transferred to the floor under [facility] care. -MRCP showed mild peripancreatic fluid along pancreatic head/uncinate process related to acute pancreatitis.  No fluid collection or pseudocyst.  Cholelithiasis -HIDA scan was ordered: Patient refused -GI was consulted: Recommended outpatient follow-up with Dr. Carlean Purl in 2 to 4 weeks -Was on Rocephin and Flagyl: Currently still on Unasyn -Blood cultures on 01/21/2019: Anaerobic bottle only with gram-negative rods: Bacteroides vulgatus -Status post laparoscopic converted to open cholecystectomy on 02/02/2019.  Diet/pain management/wound care/duration of antibiotic treatment as per general surgery recommendations. -PT recommends home health PT. -Currently afebrile and hemodynamically stable.  Bile  leak -GI was consulted on 02/06/2019 by general surgery for increased JP drainage with concern for bile leak.  Status post ERCP and 1 plastic stent placement into the CBD on 02/07/2019 by GI.  Patient will need repeat ERCP in about 8 weeks for biliary stent removal. -Patient complains of increasing abdominal pain this morning.  Will check lipase to rule out ERCP induced pancreatitis.  Leukocytosis -Labs pending for today.  Monitor.  Acute kidney injury on chronic kidney disease stage III -developed AKI in the setting of septic shock.  Creatinine peaked to 8.26 -Renal ultrasound showed no obstruction.  Urine culture showed no growth.   -Nephrology following : Status post HD x2, last HD was on 01/27/2019.  Currently HD on hold.  Follow urine output. -Creatinine pending today.  No indication for hemodialysis currently.  Acute respiratory failure with hypoxia -Due to combination of COPD/heart failure/OSA -Not on oxygen at home -Currently on room air.  Incentive spirometry.  Essential hypertension -Blood pressure currently stable.  Continue amlodipine and metoprolol  Hyperlipidemia -Statin on hold due to elevated LFTs  Chronic diastolic CHF -Echo showed EF of 60 to 65% with grade 1 diastolic dysfunction -Nephrology following and managing volume.  Strict input and output and daily weights.  Diabetes mellitus type 2 uncontrolled with hyperglycemia -Continue Lantus.  Continue CBGs with SSI.  Left knee pain --Patient was supposed to have some type of procedure few months ago however this was delayed as his wife had Covid. -Orthopedics consulted and appreciated, status post left knee aspiration and injection (under 90 mL of clear fluid was obtained, 6 mL of 0.5% Marcaine and 80 mg of Depo-Medrol injected)  GERD/history of GI bleed -Continue PPI  History of paroxysmal A. Fib -Currently rate controlled and in sinus rhythm.  Not on anticoagulation as an outpatient.  Continue metoprolol.   Morbid obesity  -outpatient follow-up  Generalized deconditioning -PT recommends home health PT   DVT prophylaxis: Heparin Code Status: Full Family Communication: Spoke to patient at bedside Disposition Plan: Home with home health PT once cleared by general surgery/GI Consultants: PCCM/general surgery/nephrology/gastroenterology's orthopedic surgery  Procedures:  Echo IMPRESSIONS    1. Left ventricular ejection fraction, by visual estimation, is 60 to 65%. The left ventricle has normal function. There is no left ventricular hypertrophy.  2. Left ventricular diastolic parameters are consistent with Grade I diastolic dysfunction (impaired relaxation).  3. Global right ventricle has normal systolic function.The right ventricular size is normal. No increase in right ventricular wall thickness.  4. Left atrial size was mildly dilated.  5. Right atrial size was normal.  6. Moderate mitral annular calcification.  7. The mitral valve is normal in structure. No evidence of mitral valve regurgitation. No evidence of mitral stenosis.  8. The tricuspid valve is normal in structure. Tricuspid valve regurgitation is not demonstrated.  9. The aortic valve is tricuspid. Aortic valve regurgitation is not visualized. Mild aortic valve sclerosis without stenosis. 10. The pulmonic valve was normal in structure. Pulmonic valve regurgitation is not visualized. 11. The inferior vena cava is normal in size with greater than 50% respiratory variability, suggesting right atrial pressure of 3 mmHg. 12. The interatrial septum was not well visualized.  Left knee aspiration and injection  Insertion of right IJ nontunneled HD cath  laparoscopic converted to open cholecystectomy on 02/02/2019  ERCP on 02/07/2019 Impression:               - A bile leak was found at the cystic duct.                           - Prior cholecystectomy.                           - One plastic stent was placed into the common  bile                            duct. Recommendation:           - Return patient to hospital ward for ongoing care.                           - Observe patient's clinical course following                            today's ERCP with therapeutic intervention.                           - Repeat ERCP in about 8 weeks for biliary stent                            removal.  Antimicrobials:  Anti-infectives (From admission, onward)   Start     Dose/Rate Route Frequency Ordered Stop   02/08/19 0022  Ampicillin-Sulbactam (UNASYN) 3 g in sodium chloride 0.9 % 100 mL IVPB     3 g 200 mL/hr over 30 Minutes Intravenous Every 8 hours 02/07/19 1712     01/31/19 2200  Ampicillin-Sulbactam (UNASYN) 3 g  in sodium chloride 0.9 % 100 mL IVPB  Status:  Discontinued     3 g 200 mL/hr over 30 Minutes Intravenous Every 12 hours 01/31/19 1051 02/07/19 1712   01/31/19 1000  Ampicillin-Sulbactam (UNASYN) 3 g in sodium chloride 0.9 % 100 mL IVPB  Status:  Discontinued     3 g 200 mL/hr over 30 Minutes Intravenous Every 24 hours 01/31/19 0859 01/31/19 1051   01/23/19 1400  piperacillin-tazobactam (ZOSYN) IVPB 2.25 g  Status:  Discontinued     2.25 g 100 mL/hr over 30 Minutes Intravenous Every 8 hours 01/23/19 0717 01/23/19 0947   01/23/19 1400  cefTRIAXone (ROCEPHIN) 2 g in sodium chloride 0.9 % 100 mL IVPB  Status:  Discontinued     2 g 200 mL/hr over 30 Minutes Intravenous Every 24 hours 01/23/19 0947 01/31/19 0857   01/23/19 1400  metroNIDAZOLE (FLAGYL) IVPB 500 mg  Status:  Discontinued     500 mg 100 mL/hr over 60 Minutes Intravenous Every 8 hours 01/23/19 0947 01/31/19 0857   01/21/19 1400  piperacillin-tazobactam (ZOSYN) IVPB 3.375 g  Status:  Discontinued     3.375 g 12.5 mL/hr over 240 Minutes Intravenous Every 8 hours 01/21/19 1325 01/23/19 0717      Subjective: Patient seen and examined at bedside.  Poor historian.  Complains of increasing abdominal pain.   No overnight fever or vomiting.  No  worsening shortness of breath.  Objective: Vitals:   02/07/19 1434 02/07/19 2018 02/07/19 2107 02/08/19 0518  BP: (!) 141/82 140/82 132/76 (!) 135/57  Pulse: 75 71 72 66  Resp: 20 20 20 18   Temp: 98.3 F (36.8 C) (!) 97.4 F (36.3 C) 97.6 F (36.4 C) 98.6 F (37 C)  TempSrc: Oral Oral Oral Oral  SpO2: 96% 94% 98% 97%  Weight:      Height:        Intake/Output Summary (Last 24 hours) at 02/08/2019 0756 Last data filed at 02/08/2019 0334 Gross per 24 hour  Intake 1295 ml  Output 2170 ml  Net -875 ml   Filed Weights   02/06/19 0520 02/07/19 0542 02/07/19 1129  Weight: (!) 153 kg (!) 150.1 kg (!) 150.1 kg    Examination:  General exam: Looks to be in mild distress because of abdominal pain.  Looks chronically ill.   Poor historian.   Respiratory system: Bilateral decreased breath sounds mostly at the bases, no wheezing.  Some scattered crackles  cardiovascular system: Rate controlled, S1-S2 heard Gastrointestinal system: Abdomen is nondistended, soft and dressing present with mild tenderness. Normal bowel sounds heard. Extremities: No cyanosis; trace bilateral lower extremity edema present  Data Reviewed: I have personally reviewed following labs and imaging studies  CBC: Recent Labs  Lab 02/03/19 0353 02/04/19 0254 02/05/19 0455 02/06/19 0717 02/07/19 0858  WBC 19.6* 14.2* 14.9* 15.0* 12.6*  NEUTROABS 16.3* 11.0* 11.2* 11.8* 9.9*  HGB 11.2* 10.6* 11.2* 10.1* 10.1*  HCT 35.6* 33.6* 36.9* 32.0* 32.5*  MCV 91.3 90.3 93.9 92.2 93.1  PLT 324 295 407* 345 [id number]   Basic Metabolic Panel: Recent Labs  Lab 02/02/19 0444 02/03/19 0353 02/04/19 0254 02/05/19 0455 02/06/19 0717 02/07/19 0858  NA 139 139 139 140 139 143  K 3.6 4.0 3.7 3.9 3.7 3.1*  CL 102 103 107 107 107 110  CO2 23 22 20* 23 21* 21*  GLUCOSE 158* 185* 155* 155* 156* 142*  BUN 64* 74* 65* 53* 48* 39*  CREATININE 4.74* 4.69* 3.87* 3.23* 2.91* 2.84*  CALCIUM 8.3* 8.6* 8.4* 8.9 8.8* 9.0  MG 2.0  --   2.0 1.8 1.7 1.7   GFR: Estimated Creatinine Clearance: 36.6 mL/min (A) (by C-G formula based on SCr of 2.84 mg/dL (H)). Liver Function Tests: Recent Labs  Lab 02/03/19 0353 02/04/19 0254 02/05/19 0455 02/06/19 0717 02/07/19 0858  AST 31 23 20  35 41  ALT 18 15 15 23  32  ALKPHOS 69 68 69 86 93  BILITOT 0.8 0.9 0.8 1.0 0.7  PROT 6.5 6.2* 6.8 6.5 6.9  ALBUMIN 2.5* 2.3* 2.5* 2.4* 2.5*   No results for input(s): LIPASE, AMYLASE in the last 168 hours. No results for input(s): AMMONIA in the last 168 hours. Coagulation Profile: No results for input(s): INR, PROTIME in the last 168 hours. Cardiac Enzymes: No results for input(s): CKTOTAL, CKMB, CKMBINDEX, TROPONINI in the last 168 hours. BNP (last 3 results) No results for input(s): PROBNP in the last 8760 hours. HbA1C: No results for input(s): HGBA1C in the last 72 hours. CBG: Recent Labs  Lab 02/06/19 1639 02/06/19 2222 02/07/19 0802 02/07/19 1709 02/07/19 2314  GLUCAP 207* 137* 125* 175* 163*   Lipid Profile: No results for input(s): CHOL, HDL, LDLCALC, TRIG, CHOLHDL, LDLDIRECT in the last 72 hours. Thyroid Function Tests: No results for input(s): TSH, T4TOTAL, FREET4, T3FREE, THYROIDAB in the last 72 hours. Anemia Panel: No results for input(s): VITAMINB12, FOLATE, FERRITIN, TIBC, IRON, RETICCTPCT in the last 72 hours. Sepsis Labs: No results for input(s): PROCALCITON, LATICACIDVEN in the last 168 hours.  No results found for this or any previous visit (from the past 240 hour(s)).       Radiology Studies: Dg Ercp  Result Date: 02/07/2019 CLINICAL DATA:  68 year old male with bile leak. EXAM: ERCP TECHNIQUE: Multiple spot images obtained with the fluoroscopic device and submitted for interpretation post-procedure. FLUOROSCOPY TIME:  Fluoroscopy Time:  1 minutes 59 seconds COMPARISON:  None. FINDINGS: A total of 8 intraoperative saved images are submitted for review. The images demonstrate a flexible endoscope in  the descending duodenum with wire cannulation of the common bile duct. Cholangiogram was subsequently performed. There is extravasation of contrast material from the region of the cystic duct stump into the peritoneal space adjacent to a surgical drain. On the final images, a plastic biliary stent has been placed. IMPRESSION: 1. Positive for bile leak. 2. Placement of a plastic biliary stent. These images were submitted for radiologic interpretation only. Please see the procedural report for the amount of contrast and the fluoroscopy time utilized. Electronically Signed   By: Jacqulynn Cadet M.D.   On: 02/07/2019 13:32        Scheduled Meds: . acetaminophen  1,000 mg Oral Q8H  . amLODipine  5 mg Oral Daily  . Chlorhexidine Gluconate Cloth  6 each Topical Daily  . DULoxetine  60 mg Oral Daily  . feeding supplement  1 Container Oral TID BM  . insulin aspart  0-5 Units Subcutaneous QHS  . insulin aspart  0-9 Units Subcutaneous TID WC  . insulin glargine  20 Units Subcutaneous Q2200  . mouth rinse  15 mL Mouth Rinse BID  . methocarbamol  500 mg Oral TID  . metoprolol succinate  100 mg Oral Daily  . pantoprazole  40 mg Oral Daily  . simethicone  80 mg Oral QID  . sodium chloride flush  10-40 mL Intracatheter Q12H  . thiamine  100 mg Oral Daily  . traZODone  300 mg Oral QHS   Continuous Infusions: . ampicillin-sulbactam (  UNASYN) IV 3 g (02/08/19 0055)          Aline August, MD [facility] 02/08/2019, 7:56 AM

## 2019-02-09 LAB — BASIC METABOLIC PANEL
Anion gap: 10 (ref 5–15)
BUN: 26 mg/dL — ABNORMAL HIGH (ref 8–23)
CO2: 21 mmol/L — ABNORMAL LOW (ref 22–32)
Calcium: 8.5 mg/dL — ABNORMAL LOW (ref 8.9–10.3)
Chloride: 111 mmol/L (ref 98–111)
Creatinine, Ser: 1.9 mg/dL — ABNORMAL HIGH (ref 0.61–1.24)
GFR calc Af Amer: 41 mL/min — ABNORMAL LOW (ref >60)
GFR calc non Af Amer: 36 mL/min — ABNORMAL LOW (ref >60)
Glucose, Bld: 150 mg/dL — ABNORMAL HIGH (ref 70–99)
Potassium: 3.3 mmol/L — ABNORMAL LOW (ref 3.5–5.1)
Sodium: 142 mmol/L (ref 135–145)

## 2019-02-09 LAB — GLUCOSE, CAPILLARY
Glucose-Capillary: 140 mg/dL — ABNORMAL HIGH (ref 70–99)
Glucose-Capillary: 146 mg/dL — ABNORMAL HIGH (ref 70–99)
Glucose-Capillary: 152 mg/dL — ABNORMAL HIGH (ref 70–99)
Glucose-Capillary: 157 mg/dL — ABNORMAL HIGH (ref 70–99)

## 2019-02-09 LAB — CBC WITH DIFFERENTIAL/PLATELET
Abs Immature Granulocytes: 0.1 10*3/uL — ABNORMAL HIGH (ref 0.00–0.07)
Basophils Absolute: 0 10*3/uL (ref 0.0–0.1)
Basophils Relative: 0 %
Eosinophils Absolute: 0.4 10*3/uL (ref 0.0–0.5)
Eosinophils Relative: 2 %
HCT: 32.5 % — ABNORMAL LOW (ref 39.0–52.0)
Hemoglobin: 10.4 g/dL — ABNORMAL LOW (ref 13.0–17.0)
Immature Granulocytes: 1 %
Lymphocytes Relative: 12 %
Lymphs Abs: 1.8 10*3/uL (ref 0.7–4.0)
MCH: 28.8 pg (ref 26.0–34.0)
MCHC: 32 g/dL (ref 30.0–36.0)
MCV: 90 fL (ref 80.0–100.0)
Monocytes Absolute: 0.9 10*3/uL (ref 0.1–1.0)
Monocytes Relative: 6 %
Neutro Abs: 12 10*3/uL — ABNORMAL HIGH (ref 1.7–7.7)
Neutrophils Relative %: 79 %
Platelets: 288 10*3/uL (ref 150–400)
RBC: 3.61 MIL/uL — ABNORMAL LOW (ref 4.22–5.81)
RDW: 15.4 % (ref 11.5–15.5)
WBC: 15.2 10*3/uL — ABNORMAL HIGH (ref 4.0–10.5)
nRBC: 0 % (ref 0.0–0.2)

## 2019-02-09 LAB — MAGNESIUM: Magnesium: 1.8 mg/dL (ref 1.7–2.4)

## 2019-02-09 MED ORDER — POTASSIUM CHLORIDE CRYS ER 20 MEQ PO TBCR
20.0000 meq | EXTENDED_RELEASE_TABLET | Freq: Once | ORAL | Status: AC
  Administered 2019-02-09: 16:00:00 20 meq via ORAL
  Filled 2019-02-09: qty 1

## 2019-02-09 MED ORDER — TORSEMIDE 20 MG PO TABS
50.0000 mg | ORAL_TABLET | ORAL | Status: DC
  Administered 2019-02-10: 08:00:00 50 mg via ORAL
  Filled 2019-02-09: qty 3

## 2019-02-09 NOTE — Discharge Instructions (Signed)
Surgical [facility] Surgical drains are used to remove extra fluid that normally builds up in a surgical wound after surgery. A surgical drain helps to heal a surgical wound. Different kinds of surgical drains include:  Active drains. These drains use suction to pull drainage away from the surgical wound. Drainage flows through a tube to a container outside of the body. With these drains, you need to keep the bulb or the drainage container flat (compressed) at all times, except while you empty it. Flattening the bulb or container creates suction.  Passive drains. These drains allow fluid to drain naturally, by gravity. Drainage flows through a tube to a bandage (dressing) or a container outside of the body. Passive drains do not need to be emptied. A drain is placed during surgery. Right after surgery, drainage is usually bright red and a little thicker than water. The drainage may gradually turn yellow or pink and become thinner. It is likely that your health care provider will remove the drain when the drainage stops or when the amount decreases to 1-2 Tbsp (15-30 mL) during a 24-hour period. Supplies needed:  Tape.  Germ-free cleaning solution (sterile saline).  Cotton swabs.  Split gauze drain sponge: 4 x 4 inches (10 x 10 cm).  Gauze square: 4 x 4 inches (10 x 10 cm). How to care for your surgical drain Care for your drain as told by your health care provider. This is important to help prevent infection. If your drain is placed at your back, or any other hard-to-reach area, ask another person to assist you in performing the following tasks: General care  Keep the skin around the drain dry and covered with a dressing at all times.  Check your drain area every day for signs of infection. Check for: ? Redness, swelling, or pain. ? Pus or a bad smell. ? Cloudy drainage. ? Tenderness or pressure at the drain exit site. Changing the dressing Follow instructions from your health care  provider about how to change your dressing. Change your dressing at least once a day. Change it more often if needed to keep the dressing dry. Make sure you: 1. Gather your supplies. 2. Wash your hands with soap and water before you change your dressing. If soap and water are not available, use hand sanitizer. 3. Remove the old dressing. Avoid using scissors to do that. 4. Wash your hands with soap and water again after removing the old dressing. 5. Use sterile saline to clean your skin around the drain. You may need to use a cotton swab to clean the skin. 6. Place the tube through the slit in a drain sponge. Place the drain sponge so that it covers your wound. 7. Place the gauze square or another drain sponge on top of the drain sponge that is on the wound. Make sure the tube is between those layers. 8. Tape the dressing to your skin. 9. Tape the drainage tube to your skin 1-2 inches (2.5-5 cm) below the place where the tube enters your body. Taping keeps the tube from pulling on any stitches (sutures) that you have. 10. Wash your hands with soap and water. 11. Write down the color of your drainage and how often you change your dressing. How to empty your active drain  1. Make sure that you have a measuring cup that you can empty your drainage into. 2. Wash your hands with soap and water. If soap and water are not available, use hand sanitizer. 3.  Loosen any pins or clips that hold the tube in place. °4. If your health care provider tells you to strip the tube to prevent clots and tube blockages: °? Hold the tube at the skin with one hand. Use your other hand to pinch the tubing with your thumb and first finger. °? Gently move your fingers down the tube while squeezing very lightly. This clears any drainage, clots, or tissue from the tube. °? You may need to do this several times each day to keep the tube clear. Do not pull on the tube. °5. Open the bulb cap or the drain plug. Do not touch the  inside of the cap or the bottom of the plug. °6. Turn the device upside down and gently squeeze. °7. Empty all of the drainage into the measuring cup. °8. Compress the bulb or the container and replace the cap or the plug. To compress the bulb or the container, squeeze it firmly in the middle while you close the cap or plug the container. °9. Write down the amount of drainage that you have in each 24-hour period. If you have less than 2 Tbsp (30 mL) of drainage during 24 hours, contact your health care provider. °10. Flush the drainage down the toilet. °11. Wash your hands with soap and water. °Contact a health care provider if: °· You have redness, swelling, or pain around your drain area. °· You have pus or a bad smell coming from your drain area. °· You have a fever or chills. °· The skin around your drain is warm to the touch. °· The amount of drainage that you have is increasing instead of decreasing. °· You have drainage that is cloudy. °· There is a sudden stop or a sudden decrease in the amount of drainage that you have. °· Your drain tube falls out. °· Your active drain does not stay compressed after you empty it. °Summary °· Surgical drains are used to remove extra fluid that normally builds up in a surgical wound after surgery. °· Different kinds of surgical drains include active drains and passive drains. Active drains use suction to pull drainage away from the surgical wound, and passive drains allow fluid to drain naturally. °· It is important to care for your drain to prevent infection. If your drain is placed at your back, or any other hard-to-reach area, ask another person to assist you. °· Contact your health care provider if you have redness, swelling, or pain around your drain area. °This information is not intended to replace advice given to you by your health care provider. Make sure you discuss any questions you have with your health care provider. °Document Released: 02/28/2000 Document  Revised: 04/06/2018 Document Reviewed: 04/06/2018 °[employer]vier Patient Education © 2020 [employer] ° °

## 2019-02-09 NOTE — Progress Notes (Signed)
Patient ID: Phillip Maldonado, male   DOB: 1950-10-24, 68 y.o.   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 fib, chronic abdominal pain, recent treatment for upper GI bleeding presented with acute abdominal pain.  He was admitted for gallstone pancreatitis.  He was noted to have septic shock on 01/21/2019 and was transferred to ICU.  He was eventually weaned off of pressors and transferred back to [facility] service.  MRCP showed acute pancreatitis, cholelithiasis, no CBD obstruction.  General surgery was consulted.  He was also found to have worsening creatinine.  Nephrology was consulted.  He needed temporary hemodialysis.  He underwent cholecystectomy on 02/02/2019.  He underwent ERCP and plastic stent placement into CBD on 02/07/2019 for bile leak.  Assessment & Plan:   Sepsis/septic shock: Present on admission Acute cholecystitis Acute gallstone pancreatitis -Initially required ICU care and vasopressors.  After blood pressure improved, he was transferred to the floor under [place] care. -MRCP showed mild peripancreatic fluid along pancreatic head/uncinate process related to acute pancreatitis.  No fluid collection or pseudocyst.  Cholelithiasis -HIDA scan was ordered: Patient refused -GI was consulted: Recommended outpatient follow-up with Dr. Carlean Purl in 2 to 4 weeks -Was on Rocephin and Flagyl: Currently still on Unasyn -Blood cultures on 01/21/2019: Anaerobic bottle only with gram-negative rods: Bacteroides vulgatus -Status post laparoscopic converted to open cholecystectomy on 02/02/2019.  Diet/pain management/wound care/duration of antibiotic treatment as per general surgery recommendations. -PT recommends home health PT. -Currently afebrile and hemodynamically stable.  Bile  leak -GI was consulted on 02/06/2019 by general surgery for increased JP drainage with concern for bile leak.  Status post ERCP and 1 plastic stent placement into the CBD on 02/07/2019 by GI.  Patient will need repeat ERCP in about 8 weeks for biliary stent removal.  GI signed off on 02/08/2019.  Leukocytosis -Worsening.  Monitor.  Acute kidney injury on chronic kidney disease stage III -developed AKI in the setting of septic shock.  Creatinine peaked to 8.26 -Renal ultrasound showed no obstruction.  Urine culture showed no growth.   -Nephrology following : Status post HD x2, last HD was on 01/27/2019.  Currently HD on hold.  Follow urine output. -Creatinine 1.90.   Hypokalemia -Replace.  Repeat a.m. labs  Acute respiratory failure with hypoxia -Due to combination of COPD/heart failure/OSA -Not on oxygen at home -Currently on room air.  Incentive spirometry.  Essential hypertension -Blood pressure currently stable.  Continue amlodipine and metoprolol  Hyperlipidemia -Statin on hold due to elevated LFTs  Chronic diastolic CHF -Echo showed EF of 60 to 65% with grade 1 diastolic dysfunction -Nephrology following and managing volume.  Strict input and output and daily weights.  Diabetes mellitus type 2 uncontrolled with hyperglycemia -Continue Lantus.  Continue CBGs with SSI.  Left knee pain --Patient was supposed to have some type of procedure few months ago however this was delayed as his wife had Covid. -Orthopedics consulted and appreciated, status post left knee aspiration and injection (under 90 mL of clear fluid was obtained, 6 mL of 0.5% Marcaine and 80 mg of Depo-Medrol injected)  GERD/history of GI bleed -Continue PPI  History of paroxysmal A. Fib -Currently rate controlled and in sinus rhythm.  Not on anticoagulation as an outpatient.  Continue metoprolol.  Morbid obesity  -outpatient  follow-up  Generalized deconditioning -PT recommends home health PT   DVT  prophylaxis: Heparin Code Status: Full Family Communication: Spoke to patient at bedside Disposition Plan: Home with home health PT once cleared by general surgery in 1 to 2 days Consultants: PCCM/general surgery/nephrology/gastroenterology/ orthopedic surgery  Procedures:  Echo IMPRESSIONS    1. Left ventricular ejection fraction, by visual estimation, is 60 to 65%. The left ventricle has normal function. There is no left ventricular hypertrophy.  2. Left ventricular diastolic parameters are consistent with Grade I diastolic dysfunction (impaired relaxation).  3. Global right ventricle has normal systolic function.The right ventricular size is normal. No increase in right ventricular wall thickness.  4. Left atrial size was mildly dilated.  5. Right atrial size was normal.  6. Moderate mitral annular calcification.  7. The mitral valve is normal in structure. No evidence of mitral valve regurgitation. No evidence of mitral stenosis.  8. The tricuspid valve is normal in structure. Tricuspid valve regurgitation is not demonstrated.  9. The aortic valve is tricuspid. Aortic valve regurgitation is not visualized. Mild aortic valve sclerosis without stenosis. 10. The pulmonic valve was normal in structure. Pulmonic valve regurgitation is not visualized. 11. The inferior vena cava is normal in size with greater than 50% respiratory variability, suggesting right atrial pressure of 3 mmHg. 12. The interatrial septum was not well visualized.  Left knee aspiration and injection  Insertion of right IJ nontunneled HD cath  laparoscopic converted to open cholecystectomy on 02/02/2019  ERCP on 02/07/2019 Impression:               - A bile leak was found at the cystic duct.                           - Prior cholecystectomy.                           - One plastic stent was placed into the common bile                            duct. Recommendation:           - Return patient to hospital ward  for ongoing care.                           - Observe patient's clinical course following                            today's ERCP with therapeutic intervention.                           - Repeat ERCP in about 8 weeks for biliary stent                            removal.  Antimicrobials:  Anti-infectives (From admission, onward)   Start     Dose/Rate Route Frequency Ordered Stop   02/08/19 0022  Ampicillin-Sulbactam (UNASYN) 3 g in sodium chloride 0.9 % 100 mL IVPB     3 g 200 mL/hr over 30 Minutes Intravenous Every 8 hours 02/07/19 1712     01/31/19 2200  Ampicillin-Sulbactam (UNASYN) 3 g in sodium chloride 0.9 % 100 mL IVPB  Status:  Discontinued     3 g 200 mL/hr over 30 Minutes Intravenous Every 12 hours 01/31/19 1051 02/07/19 1712   01/31/19 1000  Ampicillin-Sulbactam (UNASYN) 3 g in sodium chloride 0.9 % 100 mL IVPB  Status:  Discontinued     3 g 200 mL/hr over 30 Minutes Intravenous Every 24 hours 01/31/19 0859 01/31/19 1051   01/23/19 1400  piperacillin-tazobactam (ZOSYN) IVPB 2.25 g  Status:  Discontinued     2.25 g 100 mL/hr over 30 Minutes Intravenous Every 8 hours 01/23/19 0717 01/23/19 0947   01/23/19 1400  cefTRIAXone (ROCEPHIN) 2 g in sodium chloride 0.9 % 100 mL IVPB  Status:  Discontinued     2 g 200 mL/hr over 30 Minutes Intravenous Every 24 hours 01/23/19 0947 01/31/19 0857   01/23/19 1400  metroNIDAZOLE (FLAGYL) IVPB 500 mg  Status:  Discontinued     500 mg 100 mL/hr over 60 Minutes Intravenous Every 8 hours 01/23/19 0947 01/31/19 0857   01/21/19 1400  piperacillin-tazobactam (ZOSYN) IVPB 3.375 g  Status:  Discontinued     3.375 g 12.5 mL/hr over 240 Minutes Intravenous Every 8 hours 01/21/19 1325 01/23/19 0717      Subjective: Patient seen and examined at bedside.  Poor historian.  Denies fever, worsening shortness of breath, vomiting.  Feels that his abdominal pain is improving.   Objective: Vitals:   02/08/19 1317 02/08/19 2102 02/09/19 0500 02/09/19 0510   BP: 115/72 (!) 125/49  123/66  Pulse: 69 63  70  Resp: 18     Temp: 97.6 F (36.4 C) 98.4 F (36.9 C)  97.7 F (36.5 C)  TempSrc: Oral   Oral  SpO2: 100% 98%  98%  Weight:   (!) 149.6 kg   Height:        Intake/Output Summary (Last 24 hours) at 02/09/2019 0837 Last data filed at 02/09/2019 [id number] Gross per 24 hour  Intake 1005 ml  Output 2280 ml  Net -1275 ml   Filed Weights   02/07/19 0542 02/07/19 1129 02/09/19 0500  Weight: (!) 150.1 kg (!) 150.1 kg (!) 149.6 kg    Examination:  General exam: No acute distress.  Looks chronically ill.   Poor historian.   Respiratory system: Bilateral decreased breath sounds mostly at the bases with some scattered crackles  cardiovascular system: S1-S2 heard, rate controlled Gastrointestinal system: Abdomen is nondistended, soft and dressing present with mild tenderness. Normal bowel sounds heard. Extremities: No cyanosis; trace bilateral lower extremity edema present  Data Reviewed: I have personally reviewed following labs and imaging studies  CBC: Recent Labs  Lab 02/05/19 0455 02/06/19 0717 02/07/19 0858 02/08/19 1051 02/09/19 0245  WBC 14.9* 15.0* 12.6* 12.7* 15.2*  NEUTROABS 11.2* 11.8* 9.9* 10.1* 12.0*  HGB 11.2* 10.1* 10.1* 10.0* 10.4*  HCT 36.9* 32.0* 32.5* 31.5* 32.5*  MCV 93.9 92.2 93.1 90.5 90.0  PLT 407* 345 328 267 [id number]   Basic Metabolic Panel: Recent Labs  Lab 02/05/19 0455 02/06/19 0717 02/07/19 0858 02/08/19 1051 02/09/19 0245  NA 140 139 143 143 142  K 3.9 3.7 3.1* 3.2* 3.3*  CL 107 107 110 111 111  CO2 23 21* 21* 21* 21*  GLUCOSE 155* 156* 142* 146* 150*  BUN 53* 48* 39* 32* 26*  CREATININE 3.23* 2.91* 2.84* 2.09* 1.90*  CALCIUM 8.9 8.8* 9.0 8.5* 8.5*  MG 1.8 1.7 1.7 1.6* 1.8   GFR: Estimated Creatinine Clearance: 54.6 mL/min (A) (by C-G formula based on SCr of 1.9 mg/dL (H)).  Liver Function Tests: Recent Labs  Lab 02/04/19 0254 02/05/19 0455 02/06/19 0717 02/07/19 0858 02/08/19 1051   AST 23 20 35 41 37  ALT 15 15 23  32 37  ALKPHOS 68 69 86 93 92  BILITOT 0.9 0.8 1.0 0.7 0.8  PROT 6.2* 6.8 6.5 6.9 6.7  ALBUMIN 2.3* 2.5* 2.4* 2.5* 2.3*   Recent Labs  Lab 02/08/19 1051  LIPASE 878*   No results for input(s): AMMONIA in the last 168 hours. Coagulation Profile: No results for input(s): INR, PROTIME in the last 168 hours. Cardiac Enzymes: No results for input(s): CKTOTAL, CKMB, CKMBINDEX, TROPONINI in the last 168 hours. BNP (last 3 results) No results for input(s): PROBNP in the last 8760 hours. HbA1C: No results for input(s): HGBA1C in the last 72 hours. CBG: Recent Labs  Lab 02/08/19 0815 02/08/19 1119 02/08/19 1711 02/08/19 2104 02/09/19 0752  GLUCAP 145* 122* 196* 154* 140*   Lipid Profile: No results for input(s): CHOL, HDL, LDLCALC, TRIG, CHOLHDL, LDLDIRECT in the last 72 hours. Thyroid Function Tests: No results for input(s): TSH, T4TOTAL, FREET4, T3FREE, THYROIDAB in the last 72 hours. Anemia Panel: No results for input(s): VITAMINB12, FOLATE, FERRITIN, TIBC, IRON, RETICCTPCT in the last 72 hours. Sepsis Labs: No results for input(s): PROCALCITON, LATICACIDVEN in the last 168 hours.  No results found for this or any previous visit (from the past 240 hour(s)).       Radiology Studies: Dg Ercp  Result Date: 02/07/2019 CLINICAL DATA:  68 year old male with bile leak. EXAM: ERCP TECHNIQUE: Multiple spot images obtained with the fluoroscopic device and submitted for interpretation post-procedure. FLUOROSCOPY TIME:  Fluoroscopy Time:  1 minutes 59 seconds COMPARISON:  None. FINDINGS: A total of 8 intraoperative saved images are submitted for review. The images demonstrate a flexible endoscope in the descending duodenum with wire cannulation of the common bile duct. Cholangiogram was subsequently performed. There is extravasation of contrast material from the region of the cystic duct stump into the peritoneal space adjacent to a surgical drain. On  the final images, a plastic biliary stent has been placed. IMPRESSION: 1. Positive for bile leak. 2. Placement of a plastic biliary stent. These images were submitted for radiologic interpretation only. Please see the procedural report for the amount of contrast and the fluoroscopy time utilized. Electronically Signed   By: Jacqulynn Cadet M.D.   On: 02/07/2019 13:32        Scheduled Meds: . acetaminophen  1,000 mg Oral Q8H  . amLODipine  5 mg Oral Daily  . Chlorhexidine Gluconate Cloth  6 each Topical Daily  . DULoxetine  60 mg Oral Daily  . feeding supplement  1 Container Oral TID BM  . insulin aspart  0-5 Units Subcutaneous QHS  . insulin aspart  0-9 Units Subcutaneous TID WC  . insulin glargine  20 Units Subcutaneous Q2200  . mouth rinse  15 mL Mouth Rinse BID  . methocarbamol  750 mg Oral TID  . metoprolol succinate  100 mg Oral Daily  . pantoprazole  40 mg Oral Daily  . simethicone  80 mg Oral QID  . thiamine  100 mg Oral Daily  . traZODone  300 mg Oral QHS   Continuous Infusions: . ampicillin-sulbactam (UNASYN) IV 3 g (02/09/19 [id number])          Aline August, MD [facility] 02/09/2019, 8:37 AM

## 2019-02-09 NOTE — Progress Notes (Signed)
[facility] Progress Note  2 Days Post-Op  Subjective: CC-  Nausea resolved.  Wants pumpkin pie.    Objective: Vital signs in last 24 hours: Temp:  [97.6 F (36.4 C)-98.4 F (36.9 C)] 97.7 F (36.5 C) (11/26 0510) Pulse Rate:  [63-70] 70 (11/26 0510) Resp:  [18] 18 (11/25 1317) BP: (115-125)/(49-72) 123/66 (11/26 0510) SpO2:  [98 %-100 %] 98 % (11/26 0510) Weight:  [149.6 kg] 149.6 kg (11/26 0500) Last BM Date: 02/08/19  Intake/Output from previous day: 11/25 0701 - 11/26 0700 In: 1005 [P.O.:620; IV Piggyback:250] Out: 2280 [Urine:1800; Drains:480] Intake/Output this shift: Total I/O In: -  Out: 325 [Urine:325]  PE: [id number], NAD Pulm: Normal rate and effort Abd: Soft,appropriately ttp in RUQ and around drain, non-distended,+BS. Incisions c/d/i with staples intact and no erythema or drainage, drain in RUQ with frank bile Skin: warm and dry Psych: A&Ox3   Lab Results:  Recent Labs    02/08/19 1051 02/09/19 0245  WBC 12.7* 15.2*  HGB 10.0* 10.4*  HCT 31.5* 32.5*  PLT 267 288   BMET Recent Labs    02/08/19 1051 02/09/19 0245  NA 143 142  K 3.2* 3.3*  CL 111 111  CO2 21* 21*  GLUCOSE 146* 150*  BUN 32* 26*  CREATININE 2.09* 1.90*  CALCIUM 8.5* 8.5*   PT/INR No results for input(s): LABPROT, INR in the last 72 hours. CMP     Component Value Date/Time   NA 142 02/09/2019 0245   NA 139 02/12/2015 1424   K 3.3 (L) 02/09/2019 0245   CL 111 02/09/2019 0245   CO2 21 (L) 02/09/2019 0245   GLUCOSE 150 (H) 02/09/2019 0245   BUN 26 (H) 02/09/2019 0245   BUN 15 02/12/2015 1424   CREATININE 1.90 (H) 02/09/2019 0245   CREATININE 1.14 10/01/2016 1504   CALCIUM 8.5 (L) 02/09/2019 0245   PROT 6.7 02/08/2019 1051   PROT 6.7 10/11/2014 0833   ALBUMIN 2.3 (L) 02/08/2019 1051   ALBUMIN 3.8 10/11/2014 0833   AST 37 02/08/2019 1051   ALT 37 02/08/2019 1051   ALKPHOS 92 02/08/2019 1051   BILITOT 0.8 02/08/2019 1051   BILITOT 0.4 10/11/2014  0833   GFRNONAA 36 (L) 02/09/2019 0245   GFRNONAA 67 10/01/2016 1504   GFRAA 41 (L) 02/09/2019 0245   GFRAA 78 10/01/2016 1504   Lipase     Component Value Date/Time   LIPASE 878 (H) 02/08/2019 1051       Studies/Results: Dg Ercp  Result Date: 02/07/2019 CLINICAL DATA:  68 year old male with bile leak. EXAM: ERCP TECHNIQUE: Multiple spot images obtained with the fluoroscopic device and submitted for interpretation post-procedure. FLUOROSCOPY TIME:  Fluoroscopy Time:  1 minutes 59 seconds COMPARISON:  None. FINDINGS: A total of 8 intraoperative saved images are submitted for review. The images demonstrate a flexible endoscope in the descending duodenum with wire cannulation of the common bile duct. Cholangiogram was subsequently performed. There is extravasation of contrast material from the region of the cystic duct stump into the peritoneal space adjacent to a surgical drain. On the final images, a plastic biliary stent has been placed. IMPRESSION: 1. Positive for bile leak. 2. Placement of a plastic biliary stent. These images were submitted for radiologic interpretation only. Please see the procedural report for the amount of contrast and the fluoroscopy time utilized. Electronically Signed   By: Jacqulynn Cadet M.D.   On: 02/07/2019 13:32    Anti-infectives: Anti-infectives (From admission, onward)   Start  Dose/Rate Route Frequency Ordered Stop   02/08/19 0022  Ampicillin-Sulbactam (UNASYN) 3 g in sodium chloride 0.9 % 100 mL IVPB     3 g 200 mL/hr over 30 Minutes Intravenous Every 8 hours 02/07/19 1712     01/31/19 2200  Ampicillin-Sulbactam (UNASYN) 3 g in sodium chloride 0.9 % 100 mL IVPB  Status:  Discontinued     3 g 200 mL/hr over 30 Minutes Intravenous Every 12 hours 01/31/19 1051 02/07/19 1712   01/31/19 1000  Ampicillin-Sulbactam (UNASYN) 3 g in sodium chloride 0.9 % 100 mL IVPB  Status:  Discontinued     3 g 200 mL/hr over 30 Minutes Intravenous Every 24 hours  01/31/19 0859 01/31/19 1051   01/23/19 1400  piperacillin-tazobactam (ZOSYN) IVPB 2.25 g  Status:  Discontinued     2.25 g 100 mL/hr over 30 Minutes Intravenous Every 8 hours 01/23/19 0717 01/23/19 0947   01/23/19 1400  cefTRIAXone (ROCEPHIN) 2 g in sodium chloride 0.9 % 100 mL IVPB  Status:  Discontinued     2 g 200 mL/hr over 30 Minutes Intravenous Every 24 hours 01/23/19 0947 01/31/19 0857   01/23/19 1400  metroNIDAZOLE (FLAGYL) IVPB 500 mg  Status:  Discontinued     500 mg 100 mL/hr over 60 Minutes Intravenous Every 8 hours 01/23/19 0947 01/31/19 0857   01/21/19 1400  piperacillin-tazobactam (ZOSYN) IVPB 3.375 g  Status:  Discontinued     3.375 g 12.5 mL/hr over 240 Minutes Intravenous Every 8 hours 01/21/19 1325 01/23/19 0717       Assessment/Plan Sepsis/shock Respiratory failure with hypoxia Elevated LFTs-LFTs and Tbili all normalized on 11/13 Uncontrolled type 2 diabetes with diabetic neuropathy Hypertension GERD/Hx GI bleed AKI: intermittent HD per renal Hx AF Hx alcoholism/Gastric bypass Morbid obesity BMI 53.8  Pancreatitis, abdominal pain, cholelithiasis and jaundice S/P laparoscopic converted to open cholecystectomy, Dr. Ninfa Linden, 11/19 -POD#7 - s/p ERCP 11/24 stent placement for cystic duct leak - drain output remains high volume and bilious - advance diet.   -  robaxin to 750mg  TID, oxy 5-10 q4 hours, and morphine for breakthrough pain  [id number] diet  [id number] 11/7-11/9; Rocephin/Flagyl 11/9-11/17; Unasyn 11/17>> [id number] Foley: in place Follow up:  Dr. Ninfa Linden, GI  Possibly home tomorrow with drain.     LOS: 20 days

## 2019-02-09 NOTE — Progress Notes (Addendum)
[facility] Progress Note    Assessment/ Plan:   1. Acute renal failure - combination of IV contrast/ hypotension-shock w/  ARB/ACEi on board. Creat worsened and s/p Evidence of incipient recovery and is making good UOP on Lasix- however BUN up and he had some sleepiness/ abd pain/ bloating--> got last session of HD 11/18.  HD catheter is out.  S/p lasix IV on 11/22.  Baseline Cr appears near 1.1 mas had mild increases to 1.6 in the past - Continue supportive care   - Diuretics as below.   - Please do not resume the lisinopril on discharge given his AKI  2. Septic shock/ abd pain - shock resolved, suspected gallstone pancreatitis, surgery consulted--> had ccy 11/19, on Unasyn. AKI improving   3. Chronic pain - per primary team   4. Depression- per primary team   5. Chronic diastolic CHF-- volume overload is improved.  Note not compliant with home torsemide - he reports taking 50 mg daily rather than 100 mg ; also reported as being on metolazone.  For discharge planning: would recommend torsemide 50 mg once on MWF.  Encouraged him to take daily if worsening swelling or shortness of breath.  Would not continue metolazone.  Would then provide 10 meq daily potassium  6.  Normocytic anemia - no acute indication for PRBC's    From a renal standpoint stable for discharge.  Will sign off.  Will set up follow-up at [facility] in 7-10 days.  He confirms [phone].  Subjective:   Had 1.8 L uop over 11/25.  Got lasix 40 mg IV once.  "doing a lot better today". Thinks he will go home tomorrow.  Review of systems: Denies shortness of breath  Denies chest pain  Denies n/v but reports abd pain   Objective:   BP 123/66 (BP Location: Right Arm)   Pulse 70   Temp 97.7 F (36.5 C) (Oral)   Resp 18   Ht 5\' 9"  (1.753 m)   Wt (!) 149.6 kg   SpO2 98%   BMI 48.70 kg/m   Intake/Output Summary (Last 24 hours) at 02/09/2019 1355 Last data filed at 02/09/2019 1006 Gross per  24 hour  Intake 805 ml  Output 1685 ml  Net -880 ml   Weight change: -0.5 kg  Physical Exam: Gen: adult male in bed in NAD at rest CVS: RRR no m/r/g Resp: clear bilaterally normal work of breathing Abd: soft, Obese habitus drain in place Ext: 1+ Edema lower extremities Neuro - alert and oriented x 3 Psych - normal mood and affect  Imaging: No results found.  Labs: BMET Recent Labs  Lab 02/03/19 0353 02/04/19 0254 02/05/19 0455 02/06/19 0717 02/07/19 0858 02/08/19 1051 02/09/19 0245  NA 139 139 140 139 143 143 142  K 4.0 3.7 3.9 3.7 3.1* 3.2* 3.3*  CL 103 107 107 107 110 111 111  CO2 22 20* 23 21* 21* 21* 21*  GLUCOSE 185* 155* 155* 156* 142* 146* 150*  BUN 74* 65* 53* 48* 39* 32* 26*  CREATININE 4.69* 3.87* 3.23* 2.91* 2.84* 2.09* 1.90*  CALCIUM 8.6* 8.4* 8.9 8.8* 9.0 8.5* 8.5*   CBC Recent Labs  Lab 02/06/19 0717 02/07/19 0858 02/08/19 1051 02/09/19 0245  WBC 15.0* 12.6* 12.7* 15.2*  NEUTROABS 11.8* 9.9* 10.1* 12.0*  HGB 10.1* 10.1* 10.0* 10.4*  HCT 32.0* 32.5* 31.5* 32.5*  MCV 92.2 93.1 90.5 90.0  PLT 345 328 267 288    Medications:    .  acetaminophen  1,000 mg Oral Q8H  . amLODipine  5 mg Oral Daily  . Chlorhexidine Gluconate Cloth  6 each Topical Daily  . DULoxetine  60 mg Oral Daily  . feeding supplement  1 Container Oral TID BM  . insulin aspart  0-5 Units Subcutaneous QHS  . insulin aspart  0-9 Units Subcutaneous TID WC  . insulin glargine  20 Units Subcutaneous Q2200  . mouth rinse  15 mL Mouth Rinse BID  . methocarbamol  750 mg Oral TID  . metoprolol succinate  100 mg Oral Daily  . pantoprazole  40 mg Oral Daily  . simethicone  80 mg Oral QID  . thiamine  100 mg Oral Daily  . traZODone  300 mg Oral QHS    Claudia Desanctis 02/09/2019, 1:55 PM

## 2019-02-10 LAB — CBC WITH DIFFERENTIAL/PLATELET
Abs Immature Granulocytes: 0.08 10*3/uL — ABNORMAL HIGH (ref 0.00–0.07)
Basophils Absolute: 0.1 10*3/uL (ref 0.0–0.1)
Basophils Relative: 0 %
Eosinophils Absolute: 0.4 10*3/uL (ref 0.0–0.5)
Eosinophils Relative: 3 %
HCT: 30.9 % — ABNORMAL LOW (ref 39.0–52.0)
Hemoglobin: 9.9 g/dL — ABNORMAL LOW (ref 13.0–17.0)
Immature Granulocytes: 1 %
Lymphocytes Relative: 14 %
Lymphs Abs: 1.8 10*3/uL (ref 0.7–4.0)
MCH: 29.1 pg (ref 26.0–34.0)
MCHC: 32 g/dL (ref 30.0–36.0)
MCV: 90.9 fL (ref 80.0–100.0)
Monocytes Absolute: 0.8 10*3/uL (ref 0.1–1.0)
Monocytes Relative: 6 %
Neutro Abs: 10 10*3/uL — ABNORMAL HIGH (ref 1.7–7.7)
Neutrophils Relative %: 76 %
Platelets: 277 10*3/uL (ref 150–400)
RBC: 3.4 MIL/uL — ABNORMAL LOW (ref 4.22–5.81)
RDW: 15.4 % (ref 11.5–15.5)
WBC: 13.2 10*3/uL — ABNORMAL HIGH (ref 4.0–10.5)
nRBC: 0 % (ref 0.0–0.2)

## 2019-02-10 LAB — BASIC METABOLIC PANEL
Anion gap: 12 (ref 5–15)
BUN: 21 mg/dL (ref 8–23)
CO2: 20 mmol/L — ABNORMAL LOW (ref 22–32)
Calcium: 8.8 mg/dL — ABNORMAL LOW (ref 8.9–10.3)
Chloride: 111 mmol/L (ref 98–111)
Creatinine, Ser: 1.54 mg/dL — ABNORMAL HIGH (ref 0.61–1.24)
GFR calc Af Amer: 53 mL/min — ABNORMAL LOW (ref >60)
GFR calc non Af Amer: 46 mL/min — ABNORMAL LOW (ref >60)
Glucose, Bld: 132 mg/dL — ABNORMAL HIGH (ref 70–99)
Potassium: 3.4 mmol/L — ABNORMAL LOW (ref 3.5–5.1)
Sodium: 143 mmol/L (ref 135–145)

## 2019-02-10 LAB — GLUCOSE, CAPILLARY
Glucose-Capillary: 106 mg/dL — ABNORMAL HIGH (ref 70–99)
Glucose-Capillary: 156 mg/dL — ABNORMAL HIGH (ref 70–99)

## 2019-02-10 LAB — MAGNESIUM: Magnesium: 1.7 mg/dL (ref 1.7–2.4)

## 2019-02-10 MED ORDER — POTASSIUM CHLORIDE ER 10 MEQ PO TBCR: 10.0000 meq | EXTENDED_RELEASE_TABLET | Freq: Every day | ORAL | 0 refills | Status: DC

## 2019-02-10 MED ORDER — OXYCODONE HCL 5 MG PO TABS: 5.0000 mg | ORAL_TABLET | ORAL | 0 refills | Status: AC | PRN

## 2019-02-10 MED ORDER — ACETAMINOPHEN 500 MG PO TABS: 1000.0000 mg | ORAL_TABLET | Freq: Three times a day (TID) | ORAL | 0 refills | Status: DC

## 2019-02-10 MED ORDER — TRAZODONE HCL 300 MG PO TABS: 300.0000 mg | ORAL_TABLET | Freq: Every day | ORAL | 0 refills | Status: AC

## 2019-02-10 MED ORDER — DULOXETINE HCL 60 MG PO CPEP: 60.0000 mg | ORAL_CAPSULE | Freq: Every day | ORAL | 0 refills | Status: AC

## 2019-02-10 MED ORDER — THIAMINE HCL 100 MG PO TABS: 100.0000 mg | ORAL_TABLET | Freq: Every day | ORAL | 0 refills | Status: AC

## 2019-02-10 MED ORDER — TORSEMIDE 10 MG PO TABS: 50.0000 mg | ORAL_TABLET | ORAL | 0 refills | Status: DC

## 2019-02-10 MED ORDER — SIMETHICONE 80 MG PO CHEW: 80.0000 mg | CHEWABLE_TABLET | Freq: Four times a day (QID) | ORAL | 0 refills | Status: DC

## 2019-02-10 MED ORDER — BISACODYL 10 MG RE SUPP: 10.0000 mg | Freq: Every day | RECTAL | 0 refills | Status: DC | PRN

## 2019-02-10 MED ORDER — INSULIN GLARGINE 100 UNIT/ML ~~LOC~~ SOLN: 20.0000 [IU] | Freq: Every day | SUBCUTANEOUS | 11 refills | Status: DC

## 2019-02-10 NOTE — Progress Notes (Signed)
Patient ID: Phillip Maldonado, male   DOB: Jun 19, 1950, 68 y.o.   MRN: [id number]  3 Days Post-Op  Subjective: No complaints  Objective: Vital signs in last 24 hours: Temp:  [98.3 F (36.8 C)-98.9 F (37.2 C)] 98.3 F (36.8 C) (11/27 0459) Pulse Rate:  [65-72] 65 (11/27 0459) Resp:  [18-20] 20 (11/27 0459) BP: (129-144)/(68-79) 144/79 (11/27 0459) SpO2:  [99 %-100 %] 100 % (11/27 0459) Weight:  [149.7 kg] 149.7 kg (11/27 0500) Last BM Date: 02/09/19  Intake/Output from previous day: 11/26 0701 - 11/27 0700 In: -  Out: 805 [Urine:325; Drains:480] Intake/Output this shift: Total I/O In: 90 [Other:90] Out: -   General appearance: alert and cooperative Resp: clear to auscultation bilaterally Cardio: regular rate and rhythm GI: soft, mild tenderness. incision ok  Lab Results:  Recent Labs    02/09/19 0245 02/10/19 0257  WBC 15.2* 13.2*  HGB 10.4* 9.9*  HCT 32.5* 30.9*  PLT 288 277   BMET Recent Labs    02/09/19 0245 02/10/19 0257  NA 142 143  K 3.3* 3.4*  CL 111 111  CO2 21* 20*  GLUCOSE 150* 132*  BUN 26* 21  CREATININE 1.90* 1.54*  CALCIUM 8.5* 8.8*   PT/INR No results for input(s): LABPROT, INR in the last 72 hours. ABG No results for input(s): PHART, HCO3 in the last 72 hours.  Invalid input(s): PCO2, PO2  Studies/Results: No results found.  Anti-infectives: Anti-infectives (From admission, onward)   Start     Dose/Rate Route Frequency Ordered Stop   02/08/19 0022  Ampicillin-Sulbactam (UNASYN) 3 g in sodium chloride 0.9 % 100 mL IVPB     3 g 200 mL/hr over 30 Minutes Intravenous Every 8 hours 02/07/19 1712     01/31/19 2200  Ampicillin-Sulbactam (UNASYN) 3 g in sodium chloride 0.9 % 100 mL IVPB  Status:  Discontinued     3 g 200 mL/hr over 30 Minutes Intravenous Every 12 hours 01/31/19 1051 02/07/19 1712   01/31/19 1000  Ampicillin-Sulbactam (UNASYN) 3 g in sodium chloride 0.9 % 100 mL IVPB  Status:  Discontinued     3 g 200 mL/hr over 30  Minutes Intravenous Every 24 hours 01/31/19 0859 01/31/19 1051   01/23/19 1400  piperacillin-tazobactam (ZOSYN) IVPB 2.25 g  Status:  Discontinued     2.25 g 100 mL/hr over 30 Minutes Intravenous Every 8 hours 01/23/19 0717 01/23/19 0947   01/23/19 1400  cefTRIAXone (ROCEPHIN) 2 g in sodium chloride 0.9 % 100 mL IVPB  Status:  Discontinued     2 g 200 mL/hr over 30 Minutes Intravenous Every 24 hours 01/23/19 0947 01/31/19 0857   01/23/19 1400  metroNIDAZOLE (FLAGYL) IVPB 500 mg  Status:  Discontinued     500 mg 100 mL/hr over 60 Minutes Intravenous Every 8 hours 01/23/19 0947 01/31/19 0857   01/21/19 1400  piperacillin-tazobactam (ZOSYN) IVPB 3.375 g  Status:  Discontinued     3.375 g 12.5 mL/hr over 240 Minutes Intravenous Every 8 hours 01/21/19 1325 01/23/19 0717      Assessment/Plan: s/p Procedure(s): ENDOSCOPIC RETROGRADE CHOLANGIOPANCREATOGRAPHY (ERCP) BILIARY STENT PLACEMENT Advance diet Discharge  LOS: 21 days    Autumn Messing III 02/10/2019

## 2019-02-10 NOTE — TOC Transition Note (Signed)
Transition of Care [facility]) - CM/SW Discharge Note   Patient Details  Name: Phillip Maldonado MRN: [id number] Date of Birth: Jul 29, 1950  [facility]) CM/SW Contact:  Benard Halsted, LCSW Phone Number: 02/10/2019, 10:27 AM   Clinical Narrative:    CSW notified [facility] of patient's potential discharge today. Patient's wife provided his PCP info: Dr. Starr Lake with [facility] [place] contacted [employer] for delivery of 3in1 bedside commode to patient's room. No other needs identified at this time.    Final next level of care: [place] Barriers to Discharge: No Barriers Identified   Patient Goals and CMS Choice Patient states their goals for this hospitalization and ongoing recovery are:: Get stronger CMS Medicare.gov Compare Post Acute Care list provided to:: Patient Choice offered to / list presented to : Patient, Spouse  Discharge Placement                       Discharge Plan and Services In-house Referral: NA Discharge Planning Services: CM Consult Post Acute Care Choice: Home Health          DME Arranged: 3-N-1 DME Agency: [employer] Date DME Agency Contacted: 02/10/19 Time DME Agency Contacted: 57 Representative spoke with at DME Agency: [place]: RN, PT, OT, Nurse's Aide, Social Work [employer] Agency: [facility] [employer] [facility] [employer] Date [place]: 02/06/19 Time [place]: 1234 Representative spoke with at [place]: [place] Interventions     Readmission Risk Interventions Readmission Risk Prevention Plan 02/06/2019  Transportation Screening Complete  PCP or Specialist Appt within 5-7 Days Complete  Home Care Screening Complete  Medication Review (RN CM) Complete  Some recent data might be hidden

## 2019-02-10 NOTE — Discharge Summary (Signed)
Physician Discharge Summary  Phillip Maldonado [name] DOB: 11/11/1950 DOA: 01/20/2019  PCP: Patient, No Pcp Per  Admit date: 01/20/2019 Discharge date: 02/10/2019   Code Status: Full Code  Admitted From: Home Discharged to: [facility]: PT/OT/RN Equipment/Devices: 3 in1 Discharge Condition: Stable  Recommendations for Outpatient Follow-up   1. Follow up with PCP in 1 week 2. Please follow up BMP/CBC  3. Follow-up on renal function, patient needs nephrology appointment in 7 days.  ACE/ARB held at discharge 4. Started on Lantus 20 units nightly, Metformin held at discharge.  Reevaluate as outpatient 5. Monitor for recurrent signs of infection discontinued antibiotic at discharge after discussion with ID 6. Will need close follow-up with primary care, nephro and GI  Hospital Summary  68 year old male with history of gastric bypass in the 70s, repeated; hypertension, diabetes mellitus type 2, paroxysmal A. fib, chronic abdominal pain, recent treatment for upper GI bleeding presented with acute abdominal pain.  He was admitted for gallstone pancreatitis.  He was noted to have septic shock on 01/21/2019 and was transferred to ICU.  He was eventually weaned off of pressors and transferred back to [name] service.  MRCP showed acute pancreatitis, cholelithiasis, no CBD obstruction.  General surgery was consulted.  He was also found to have worsening creatinine.  Nephrology was consulted.  He needed temporary hemodialysis.  He underwent cholecystectomy on 02/02/2019.  He underwent ERCP and plastic stent placement into CBD on 02/07/2019 for bile leak.  Had improvement in symptoms and able to tolerate p.o. intake 11/27.  Discharged in stable condition  A & P   Principal Problem:   Acute pancreatitis Active Problems:   History of gastric bypass   History of marginal ulcers   DM type 2, uncontrolled, with neuropathy (HCC)   CKD (chronic kidney disease) stage 3, GFR 30-59 ml/min    Abdominal pain   Septic shock (HCC)   Encounter for central line placement   BMI 50.0-59.9, adult (HCC)   Elevated LFTs   Elevated amylase and lipase   Left knee pain   Gouty arthritis   Bile duct leak   Sepsis/septic shock secondary to acute cholecystitis, gallstone pancreatitis with Bacteroides bacteremia: Present on admission, currently resolved Acute cholecystitis Acute gallstone pancreatitis -Initially required ICU care and vasopressors.  After blood pressure improved, he was transferred to the floor under [place] care. -MRCP showed mild peripancreatic fluid along pancreatic head/uncinate process related to acute pancreatitis.  No fluid collection or pseudocyst.  Cholelithiasis -HIDA scan was ordered: Patient refused -GI was consulted: Recommended outpatient follow-up with Dr. Carlean Purl in 2 to 4 weeks -Blood cultures on 01/21/2019: Anaerobic bottle only with gram-negative rods: Bacteroides vulgatus -Status post laparoscopic converted to open cholecystectomy on 02/02/2019.  -PT recommends home health PT. -Completed 20 days antibiotics, currently afebrile and hemodynamically stable on room air.  Still with improving leukocytosis, currently 13.  Discussed with ID and patient is okay to be discharged without antibiotics at this time.  Bile leak -GI was consulted on 02/06/2019 by general surgery for increased JP drainage with concern for bile leak.  Status post ERCP and 1 plastic stent placement into the CBD on 02/07/2019 by GI.  Patient will need repeat ERCP in about 8 weeks for biliary stent removal.  GI signed off on 02/08/2019.  Leukocytosis -Improving, follow-up CBC outpatient -Antibiotics as above  Acute kidney injury on chronic kidney disease stage III -developed AKI in the setting of septic shock.  Creatinine peaked to 8.26--> 1.54 -Renal ultrasound showed  no obstruction.  Urine culture showed no growth.   -Nephrology on board: Status post HD x2, last HD was on 01/27/2019.   Currently HD on hold.  -Hold ACE/ARB -Follow-up with nephrology outpatient -Torsemide 50 mg Monday Wednesday Friday with extra dose if worsening swelling or shortness of breath.  Hold metolazone.  Potassium 10 mEq daily  Hypokalemia -Potassium 10 MEQ daily as above  Acute respiratory failure with hypoxia -Due to combination of COPD/heart failure/OSA -Not on oxygen at home -Currently on room air.  Incentive spirometry.  Essential hypertension -Blood pressure currently stable.  Continue amlodipine and metoprolol -Hold ACE/ARB at discharge  Hyperlipidemia -Statin on hold due to elevated LFTs -LFTs improved, can restart at discharge  Chronic diastolic CHF -Echo showed EF of 60 to 65% with grade 1 diastolic dysfunction -Nephrology managed volume: Torsemide 50 mg Monday Wednesday Friday with extra dose if worsening swelling or shortness of breath, hold metolazone as above  Diabetes mellitus type 2 uncontrolled with hyperglycemia -Continue Lantus 20 units nightly -Metformin held in setting of AKI, follow-up  Left knee pain --Patient was supposed to have some type of procedure few months ago however this was delayed as his wife had Covid. -Orthopedics consulted and appreciated, status post left knee aspiration and injection (under 90 mL of clear fluid was obtained, 6 mL of 0.5% Marcaine and 80 mg of Depo-Medrol injected)  GERD/history of GI bleed -Continue PPI  History of paroxysmal A. Fib -Currently rate controlled and in sinus rhythm.  Not on anticoagulation as an outpatient.  Continue metoprolol.  Morbid obesity  -outpatient follow-up  Generalized deconditioning -PT recommends home health PT    Consultants  . Nephro . Ortho . General surgery . PCCM  Procedures  Left knee aspiration and injection  Insertion of right IJ nontunneled HD cath  laparoscopic converted to open cholecystectomy on 02/02/2019  ERCP on 02/07/2019 Impression: - A  bile leak was found at the cystic duct. - Prior cholecystectomy. - One plastic stent was placed into the common bile  duct.  Antibiotics   Anti-infectives (From admission, onward)   Start     Dose/Rate Route Frequency Ordered Stop   02/08/19 0022  Ampicillin-Sulbactam (UNASYN) 3 g in sodium chloride 0.9 % 100 mL IVPB     3 g 200 mL/hr over 30 Minutes Intravenous Every 8 hours 02/07/19 1712     01/31/19 2200  Ampicillin-Sulbactam (UNASYN) 3 g in sodium chloride 0.9 % 100 mL IVPB  Status:  Discontinued     3 g 200 mL/hr over 30 Minutes Intravenous Every 12 hours 01/31/19 1051 02/07/19 1712   01/31/19 1000  Ampicillin-Sulbactam (UNASYN) 3 g in sodium chloride 0.9 % 100 mL IVPB  Status:  Discontinued     3 g 200 mL/hr over 30 Minutes Intravenous Every 24 hours 01/31/19 0859 01/31/19 1051   01/23/19 1400  piperacillin-tazobactam (ZOSYN) IVPB 2.25 g  Status:  Discontinued     2.25 g 100 mL/hr over 30 Minutes Intravenous Every 8 hours 01/23/19 0717 01/23/19 0947   01/23/19 1400  cefTRIAXone (ROCEPHIN) 2 g in sodium chloride 0.9 % 100 mL IVPB  Status:  Discontinued     2 g 200 mL/hr over 30 Minutes Intravenous Every 24 hours 01/23/19 0947 01/31/19 0857   01/23/19 1400  metroNIDAZOLE (FLAGYL) IVPB 500 mg  Status:  Discontinued     500 mg 100 mL/hr over 60 Minutes Intravenous Every 8 hours 01/23/19 0947 01/31/19 0857   01/21/19 1400  piperacillin-tazobactam (ZOSYN) IVPB 3.375  g  Status:  Discontinued     3.375 g 12.5 mL/hr over 240 Minutes Intravenous Every 8 hours 01/21/19 1325 01/23/19 0717       Subjective  Patient seen and examined at bedside no acute distress and resting comfortably.  No events overnight.  Tolerating diet. In good spirits and anticipating discharge.   Denies any chest pain, shortness of breath, fever, nausea, vomiting, urinary or bowel complaints. Otherwise ROS negative   Objective    Discharge Exam: Vitals:   02/09/19 2101 02/10/19 0459  BP: 129/68 (!) 144/79  Pulse: 72 65  Resp: 19 20  Temp: 98.3 F (36.8 C) 98.3 F (36.8 C)  SpO2: 99% 100%   Vitals:   02/09/19 1556 02/09/19 2101 02/10/19 0459 02/10/19 0500  BP: (!) 141/76 129/68 (!) 144/79   Pulse: 66 72 65   Resp: 18 19 20    Temp: 98.9 F (37.2 C) 98.3 F (36.8 C) 98.3 F (36.8 C)   TempSrc: Oral     SpO2: 100% 99% 100%   Weight:    (!) 149.7 kg  Height:        Physical Exam Vitals signs and nursing note reviewed.  Constitutional:      Appearance: Normal appearance. He is obese. He is not ill-appearing or diaphoretic.  HENT:     Head: Normocephalic and atraumatic.     Nose: Nose normal.     Mouth/Throat:     Mouth: Mucous membranes are moist.  Eyes:     Extraocular Movements: Extraocular movements intact.  Neck:     Musculoskeletal: Normal range of motion. No neck rigidity.  Cardiovascular:     Rate and Rhythm: Normal rate and regular rhythm.  Pulmonary:     Effort: Pulmonary effort is normal.     Breath sounds: Normal breath sounds.  Abdominal:     General: Abdomen is flat.     Palpations: Abdomen is soft.     Comments: Right upper quadrant drain with biliary drainage and without purulent discharge.  Musculoskeletal: Normal range of motion.        General: No swelling.  Neurological:     General: No focal deficit present.     Mental Status: He is alert. Mental status is at baseline.  Psychiatric:        Mood and Affect: Mood normal.        Behavior: Behavior normal.       The results of significant diagnostics from this hospitalization (including imaging, microbiology, ancillary and laboratory) are listed below for reference.     Microbiology: No results found for this or any previous visit (from the past 240 hour(s)).   Labs: BNP (last 3 results) Recent Labs    01/20/19 1114  BNP [id number]   Basic Metabolic Panel: Recent Labs  Lab 02/06/19 0717 02/07/19 0858  02/08/19 1051 02/09/19 0245 02/10/19 0257  NA 139 143 143 142 143  K 3.7 3.1* 3.2* 3.3* 3.4*  CL 107 110 111 111 111  CO2 21* 21* 21* 21* 20*  GLUCOSE 156* 142* 146* 150* 132*  BUN 48* 39* 32* 26* 21  CREATININE 2.91* 2.84* 2.09* 1.90* 1.54*  CALCIUM 8.8* 9.0 8.5* 8.5* 8.8*  MG 1.7 1.7 1.6* 1.8 1.7   Liver Function Tests: Recent Labs  Lab 02/04/19 0254 02/05/19 0455 02/06/19 0717 02/07/19 0858 02/08/19 1051  AST 23 20 35 41 37  ALT 15 15 23  32 37  ALKPHOS 68 69 86 93 92  BILITOT 0.9 0.8 1.0  0.7 0.8  PROT 6.2* 6.8 6.5 6.9 6.7  ALBUMIN 2.3* 2.5* 2.4* 2.5* 2.3*   Recent Labs  Lab 02/08/19 1051  LIPASE 878*   No results for input(s): AMMONIA in the last 168 hours. CBC: Recent Labs  Lab 02/06/19 0717 02/07/19 0858 02/08/19 1051 02/09/19 0245 02/10/19 0257  WBC 15.0* 12.6* 12.7* 15.2* 13.2*  NEUTROABS 11.8* 9.9* 10.1* 12.0* 10.0*  HGB 10.1* 10.1* 10.0* 10.4* 9.9*  HCT 32.0* 32.5* 31.5* 32.5* 30.9*  MCV 92.2 93.1 90.5 90.0 90.9  PLT 345 328 267 288 277   Cardiac Enzymes: No results for input(s): CKTOTAL, CKMB, CKMBINDEX, TROPONINI in the last 168 hours. BNP: Invalid input(s): POCBNP CBG: Recent Labs  Lab 02/09/19 1152 02/09/19 1736 02/09/19 2103 02/10/19 0750 02/10/19 1155  GLUCAP 146* 152* 157* 106* 156*   D-Dimer No results for input(s): DDIMER in the last 72 hours. Hgb A1c No results for input(s): HGBA1C in the last 72 hours. Lipid Profile No results for input(s): CHOL, HDL, LDLCALC, TRIG, CHOLHDL, LDLDIRECT in the last 72 hours. Thyroid function studies No results for input(s): TSH, T4TOTAL, T3FREE, THYROIDAB in the last 72 hours.  Invalid input(s): FREET3 Anemia work up No results for input(s): VITAMINB12, FOLATE, FERRITIN, TIBC, IRON, RETICCTPCT in the last 72 hours. Urinalysis    Component Value Date/Time   COLORURINE RED (A) 01/23/2019 1402   APPEARANCEUR TURBID (A) 01/23/2019 1402   LABSPEC  01/23/2019 1402    TEST NOT REPORTED DUE  TO COLOR INTERFERENCE OF URINE PIGMENT   PHURINE  01/23/2019 1402    TEST NOT REPORTED DUE TO COLOR INTERFERENCE OF URINE PIGMENT   GLUCOSEU (A) 01/23/2019 1402    TEST NOT REPORTED DUE TO COLOR INTERFERENCE OF URINE PIGMENT   HGBUR (A) 01/23/2019 1402    TEST NOT REPORTED DUE TO COLOR INTERFERENCE OF URINE PIGMENT   BILIRUBINUR (A) 01/23/2019 1402    TEST NOT REPORTED DUE TO COLOR INTERFERENCE OF URINE PIGMENT   BILIRUBINUR Large 12/30/2015 1600   KETONESUR (A) 01/23/2019 1402    TEST NOT REPORTED DUE TO COLOR INTERFERENCE OF URINE PIGMENT   PROTEINUR (A) 01/23/2019 1402    TEST NOT REPORTED DUE TO COLOR INTERFERENCE OF URINE PIGMENT   UROBILINOGEN 0.2 12/30/2015 1600   UROBILINOGEN 1.0 09/15/2013 1855   NITRITE (A) 01/23/2019 1402    TEST NOT REPORTED DUE TO COLOR INTERFERENCE OF URINE PIGMENT   LEUKOCYTESUR (A) 01/23/2019 1402    TEST NOT REPORTED DUE TO COLOR INTERFERENCE OF URINE PIGMENT   Sepsis Labs Invalid input(s): PROCALCITONIN,  WBC,  LACTICIDVEN Microbiology No results found for this or any previous visit (from the past 240 hour(s)).  Discharge Instructions     Discharge Instructions    Diet - low sodium heart healthy   Complete by: As directed    Discharge instructions   Complete by: As directed    You were seen and examined in the hospital for septic shock from an infection as well as pancreatitis and you had surgery. You also had worsening kidney function while hospitalized and required dialysis temoporarily. You were cared for by a hospitalist, as well as multiple specialists.  Upon Discharge:  -Start Lantus 20u nightly -Stop taking losartan or lisinopril at home until otherwise notified, as well as gabapenting, lyrica, lopressor and metformin until you discuss with your primary care doctor -Follow up with the kidney doctor in the next week -you will need to see the GI doctor in about 8 weeks  Make an appointment  with your primary care physician within 7  days Get lab work prior to your follow up appointment with your PCP Bring all home medications to your appointment to review Request that your primary physician go over all hospital tests and procedures/radiological results at the follow up.   Please get all hospital records sent to your physician by signing a hospital release before you go home.     Read the complete instructions along with all the possible side effects for all the medicines you take and that have been prescribed to you. Take any new medicines after you have completely understood and accept all the possible adverse reactions/side effects.   If you have any questions about your discharge medications or the care you received while you were in the hospital, you can call the unit and asked to speak with the hospitalist on call. Once you are discharged, your primary care physician will handle any further medical issues. Please note that NO REFILLS for any discharge medications will be authorized, as it is imperative that you return to your primary care physician (or establish a relationship with a primary care physician if you do not have one) for your aftercare needs so that they can reassess your need for medications and monitor your lab values.   Do not drive, operate heavy machinery, perform activities at heights, swimming or participation in water activities or provide baby sitting services if your were admitted for loss of consciousness/seizures or if you are on sedating medications including, but not limited to benzodiazepines, sleep medications, narcotic pain medications, etc., until you have been cleared to do so by a medical doctor.   Do not take more than prescribed medications.   Wear a seat belt while driving.  If you have smoked or chewed Tobacco in the last 2 years please stop smoking; also stop any regular Alcohol and/or any Recreational drug use including marijuana.  If you experience worsening of your admission  symptoms or develop shortness of breath, chest pain, suicidal or homicidal thoughts or experience a life threatening emergency, you must seek medical attention immediately by calling 911 or calling your PCP immediately.   Increase activity slowly   Complete by: As directed    Increase activity slowly   Complete by: As directed      Allergies as of 02/10/2019      Reactions   Chlorzoxazone Other (See Comments)   Other reaction(s): Angioedema (ALLERGY/intolerance)   Latex Other (See Comments), Rash   Rash and itching Other reaction(s): Other (See Comments) Burning Feeling   Piroxicam Other (See Comments), Swelling   unknown Other reaction(s): Other (See Comments) unknown Tongue   Adhesive [tape] Other (See Comments)   Unknown paper tape ok to use       Medication List    STOP taking these medications   acetaminophen 650 MG CR tablet Commonly known as: TYLENOL Replaced by: acetaminophen 500 MG tablet   gabapentin 300 MG capsule Commonly known as: NEURONTIN   Insulin Glargine 100 UNIT/ML Solostar Pen Commonly known as: Lantus SoloStar Replaced by: insulin glargine 100 UNIT/ML injection   lisinopril 40 MG tablet Commonly known as: ZESTRIL   losartan 100 MG tablet Commonly known as: COZAAR   metFORMIN 1000 MG tablet Commonly known as: GLUCOPHAGE   methadone 10 MG tablet Commonly known as: DOLOPHINE   metolazone 2.5 MG tablet Commonly known as: ZAROXOLYN   metoprolol tartrate 50 MG tablet Commonly known as: LOPRESSOR   naloxegol oxalate 25 MG Tabs tablet Commonly  known as: Movantik   nystatin cream Commonly known as: MYCOSTATIN   pregabalin 150 MG capsule Commonly known as: LYRICA   triamcinolone cream 0.1 % Commonly known as: KENALOG     TAKE these medications   Accu-Chek Aviva Plus test strip Generic drug: glucose blood 1 each by Other route See admin instructions. Check blood sugar twice daily   acetaminophen 500 MG tablet Commonly known as:  TYLENOL Take 2 tablets (1,000 mg total) by mouth every 8 (eight) hours. Replaces: acetaminophen 650 MG CR tablet   amLODipine 5 MG tablet Commonly known as: NORVASC Take 5 mg by mouth daily.   atorvastatin 40 MG tablet Commonly known as: LIPITOR Take 1 tablet (40 mg total) by mouth daily.   bisacodyl 10 MG suppository Commonly known as: DULCOLAX Place 1 suppository (10 mg total) rectally daily as needed for moderate constipation.   cholecalciferol 25 MCG (1000 UT) tablet Commonly known as: VITAMIN D3 Take 1,000 Units by mouth daily.   DULoxetine 60 MG capsule Commonly known as: CYMBALTA Take 1 capsule (60 mg total) by mouth daily. Start taking on: February 11, 2019 What changed:   how much to take  how to take this  when to take this  additional instructions   insulin glargine 100 UNIT/ML injection Commonly known as: LANTUS Inject 0.2 mLs (20 Units total) into the skin daily at 10 pm. Replaces: Insulin Glargine 100 UNIT/ML Solostar Pen   metoprolol succinate 100 MG 24 hr tablet Commonly known as: TOPROL-XL Take 100 mg by mouth daily.   multivitamin with minerals Tabs tablet Take 1 tablet by mouth daily.   oxyCODONE 5 MG immediate release tablet Commonly known as: Oxy IR/ROXICODONE Take 1-2 tablets (5-10 mg total) by mouth every 4 (four) hours as needed for up to 3 days for severe pain or breakthrough pain (5mg  moderate, 10mg  severe).   pantoprazole 40 MG tablet Commonly known as: PROTONIX TAKE 1 TABLET(40 MG) BY MOUTH TWICE DAILY What changed:   how much to take  how to take this  when to take this   polyethylene glycol 17 g packet Commonly known as: MIRALAX / GLYCOLAX Take 17 g by mouth daily as needed for mild constipation or moderate constipation.   polyvinyl alcohol 1.4 % ophthalmic solution Commonly known as: LIQUIFILM TEARS Place 1 drop into both eyes as needed for dry eyes.   potassium chloride 10 MEQ tablet Commonly known as:  KLOR-CON Take 1 tablet (10 mEq total) by mouth daily.   simethicone 80 MG chewable tablet Commonly known as: MYLICON Chew 1 tablet (80 mg total) by mouth 4 (four) times daily.   thiamine 100 MG tablet Take 1 tablet (100 mg total) by mouth daily. Start taking on: February 11, 2019   torsemide 10 MG tablet Commonly known as: DEMADEX Take 5 tablets (50 mg total) by mouth every Monday, Wednesday, and Friday. Start taking on: February 13, 2019 What changed:   medication strength  how much to take  when to take this   trazodone 300 MG tablet Commonly known as: DESYREL Take 1 tablet (300 mg total) by mouth at bedtime. What changed:   medication strength  how much to take  how to take this  when to take this  additional instructions            Durable Medical Equipment  (From admission, onward)         Start     Ordered   02/10/19 0901  DME 3-in-1  Once     02/10/19 0904         Follow-up Information    [facility] Follow up.   Specialty: Home Health Services Why: [place] arranged (PT/OT/RN/aide/SW)/  Contact information: [address] [place] [address] [phone]        Coralie Keens, MD Follow up in 2 week(s).   Specialty: General Surgery Contact information: [address] [place] [address] [phone]        [facility] Follow up.   Why: 3in1 bedside commode to be delivered to room prior to discharge.          Allergies  Allergen Reactions  . Chlorzoxazone Other (See Comments)    Other reaction(s): Angioedema (ALLERGY/intolerance)  . Latex Other (See Comments) and Rash    Rash and itching Other reaction(s): Other (See Comments) Burning Feeling  . Piroxicam Other (See Comments) and Swelling    unknown Other reaction(s): Other (See Comments) unknown Tongue  . Adhesive [Tape] Other (See Comments)    Unknown paper tape ok to use     Time coordinating discharge: Over 30  minutes   SIGNED:   Harold Hedge, D.O. [facility] Pager: [phone]  02/10/2019, 12:19 PM

## 2019-02-10 NOTE — Progress Notes (Signed)
Phillip Maldonado to be D/C'd Home per MD order.  Discussed with the patient and all questions fully answered.  VSS, Skin clean, dry and intact without evidence of skin break down, no evidence of skin tears noted. IV catheter discontinued intact. Site without signs and symptoms of complications. Dressing and pressure applied.  An After Visit Summary was printed and given to the patient and patient's wife.  D/c education completed with patient/family including follow up instructions, medication list, d/c activities limitations if indicated, with other d/c instructions as indicated by MD - patient able to verbalize understanding, all questions fully answered.   Patient instructed to return to ED, call 911, or call MD for any changes in condition.   Patient escorted via [place], and D/C home via private auto.  [name] 02/10/2019 3:38 PM

## 2019-02-10 NOTE — Progress Notes (Signed)
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 fib, chronic abdominal pain, recent treatment for upper GI bleeding presented with acute abdominal pain.  He was admitted for gallstone pancreatitis.  He was noted to have septic shock on 01/21/2019 and was transferred to ICU.  He was eventually weaned off of pressors and transferred back to [facility] service.  MRCP showed acute pancreatitis, cholelithiasis, no CBD obstruction.  General surgery was consulted.  He was also found to have worsening creatinine.  Nephrology was consulted.  He needed temporary hemodialysis.  He underwent cholecystectomy on 02/02/2019.    PT Comments    Pt making steady progress with functional mobility. Plan is to d/c home today with family support. Pt would continue to benefit from skilled physical therapy services at this time while admitted and after d/c to address the below listed limitations in order to improve overall safety and independence with functional mobility.    Follow Up Recommendations  Home health PT;Supervision for mobility/OOB     Equipment Recommendations  None recommended by PT    Recommendations for Other Services       Precautions / Restrictions Precautions Precautions: Other (comment) Precaution Comments: JP drain, abdominal incision Restrictions Weight Bearing Restrictions: No    Mobility  Bed Mobility Overal bed mobility: Needs Assistance Bed Mobility: Supine to Sit     Supine to sit: Supervision     General bed mobility comments: for safety  Transfers Overall transfer level: Needs assistance Equipment used: Rolling walker (2 wheeled) Transfers: Sit to/from Stand Sit to Stand: Supervision         General transfer comment: supervision to stand  from EOB for lower body dressing with with present in preparation for d/c home  Ambulation/Gait             General Gait Details: declined as he is d/c'ing home today   Stairs             Wheelchair Mobility    Modified Rankin (Stroke Patients Only)       Balance Overall balance assessment: Needs assistance Sitting-balance support: Feet supported;No upper extremity supported Sitting balance-Leahy Scale: Good     Standing balance support: Bilateral upper extremity supported Standing balance-Leahy Scale: Poor                              Cognition Arousal/Alertness: Awake/alert Behavior During Therapy: WFL for tasks assessed/performed Overall Cognitive Status: Within Functional Limits for tasks assessed                                        Exercises      General Comments        Pertinent Vitals/Pain Pain Assessment: Faces Faces Pain Scale: Hurts a little bit Pain Location: abdomen Pain Descriptors / Indicators: Sore Pain Intervention(s): Monitored during session;Repositioned    Home Living                      Prior Function            PT Goals (current goals can now be found in the care plan section) Acute Rehab PT  Goals PT Goal Formulation: With patient/family Time For Goal Achievement: 02/17/19 Potential to Achieve Goals: Good Progress towards PT goals: Progressing toward goals    Frequency    Min 3X/week      PT Plan Current plan remains appropriate    Co-evaluation              AM-PAC PT "6 Clicks" Mobility   Outcome Measure  Help needed turning from your back to your side while in a flat bed without using bedrails?: None Help needed moving from lying on your back to sitting on the side of a flat bed without using bedrails?: None Help needed moving to and from a bed to a chair (including a wheelchair)?: A Little Help needed standing up from a chair using your arms (e.g., wheelchair  or bedside chair)?: A Little Help needed to walk in hospital room?: A Little Help needed climbing 3-5 steps with a railing? : A Lot 6 Click Score: 19    End of Session   Activity Tolerance: Patient tolerated treatment well Patient left: in bed;with call bell/phone within reach;with family/visitor present Nurse Communication: Mobility status PT Visit Diagnosis: Unsteadiness on feet (R26.81);Other abnormalities of gait and mobility (R26.89);Muscle weakness (generalized) (M62.81);Pain Pain - part of body: (abdomen)     Time: [id number] PT Time Calculation (min) (ACUTE ONLY): 26 min  Charges:  $Therapeutic Activity: 23-37 mins                     Anastasio Champion, DPT  Acute Rehabilitation Services Pager [phone] Office [place] 02/10/2019, 4:00 PM

## 2019-02-14 ENCOUNTER — Telehealth: Payer: Self-pay

## 2019-02-14 DIAGNOSIS — M170 Bilateral primary osteoarthritis of knee: Secondary | ICD-10-CM | POA: Diagnosis not present

## 2019-02-14 DIAGNOSIS — G4733 Obstructive sleep apnea (adult) (pediatric): Secondary | ICD-10-CM | POA: Diagnosis not present

## 2019-02-14 DIAGNOSIS — I5032 Chronic diastolic (congestive) heart failure: Secondary | ICD-10-CM | POA: Diagnosis not present

## 2019-02-14 DIAGNOSIS — K219 Gastro-esophageal reflux disease without esophagitis: Secondary | ICD-10-CM | POA: Diagnosis not present

## 2019-02-14 DIAGNOSIS — E1142 Type 2 diabetes mellitus with diabetic polyneuropathy: Secondary | ICD-10-CM | POA: Diagnosis not present

## 2019-02-14 DIAGNOSIS — D631 Anemia in chronic kidney disease: Secondary | ICD-10-CM | POA: Diagnosis not present

## 2019-02-14 DIAGNOSIS — G709 Myoneural disorder, unspecified: Secondary | ICD-10-CM | POA: Diagnosis not present

## 2019-02-14 DIAGNOSIS — D519 Vitamin B12 deficiency anemia, unspecified: Secondary | ICD-10-CM | POA: Diagnosis not present

## 2019-02-14 DIAGNOSIS — Z48815 Encounter for surgical aftercare following surgery on the digestive system: Secondary | ICD-10-CM | POA: Diagnosis not present

## 2019-02-14 DIAGNOSIS — K5790 Diverticulosis of intestine, part unspecified, without perforation or abscess without bleeding: Secondary | ICD-10-CM | POA: Diagnosis not present

## 2019-02-14 DIAGNOSIS — N183 Chronic kidney disease, stage 3 unspecified: Secondary | ICD-10-CM | POA: Diagnosis not present

## 2019-02-14 DIAGNOSIS — E785 Hyperlipidemia, unspecified: Secondary | ICD-10-CM | POA: Diagnosis not present

## 2019-02-14 DIAGNOSIS — I480 Paroxysmal atrial fibrillation: Secondary | ICD-10-CM | POA: Diagnosis not present

## 2019-02-14 DIAGNOSIS — K760 Fatty (change of) liver, not elsewhere classified: Secondary | ICD-10-CM | POA: Diagnosis not present

## 2019-02-14 DIAGNOSIS — K5909 Other constipation: Secondary | ICD-10-CM | POA: Diagnosis not present

## 2019-02-14 DIAGNOSIS — I130 Hypertensive heart and chronic kidney disease with heart failure and stage 1 through stage 4 chronic kidney disease, or unspecified chronic kidney disease: Secondary | ICD-10-CM | POA: Diagnosis not present

## 2019-02-14 DIAGNOSIS — E1122 Type 2 diabetes mellitus with diabetic chronic kidney disease: Secondary | ICD-10-CM | POA: Diagnosis not present

## 2019-02-14 DIAGNOSIS — G894 Chronic pain syndrome: Secondary | ICD-10-CM | POA: Diagnosis not present

## 2019-02-14 DIAGNOSIS — J449 Chronic obstructive pulmonary disease, unspecified: Secondary | ICD-10-CM | POA: Diagnosis not present

## 2019-02-14 NOTE — Telephone Encounter (Signed)
-----   Message from Gatha Mayer, MD sent at 02/13/2019 12:59 PM EST ----- Regarding: RE: obtain fup appt for pt s/p biliary stent Muzamil Harker,  Please schedule this patient to see me in first part of January and I will then determine a date for stent removal.  CEG ----- Message ----- From: Vena Rua, PA-C Sent: 02/08/2019   1:24 PM EST To: Ladene Artist, MD, Gatha Mayer, MD Subject: obtain fup appt for pt s/p biliary stent       Just wanted to get this message sent as patient may possibly go home before we are able to schedule an appointment for him. Developed a bile leak after open cholecystectomy.  Dr. Fuller Plan placed a plastic stent at [facility] on 11/24  Thank you

## 2019-02-14 NOTE — Telephone Encounter (Signed)
Left message for patient to call back  

## 2019-02-15 DIAGNOSIS — E1122 Type 2 diabetes mellitus with diabetic chronic kidney disease: Secondary | ICD-10-CM | POA: Diagnosis not present

## 2019-02-15 DIAGNOSIS — I130 Hypertensive heart and chronic kidney disease with heart failure and stage 1 through stage 4 chronic kidney disease, or unspecified chronic kidney disease: Secondary | ICD-10-CM | POA: Diagnosis not present

## 2019-02-15 DIAGNOSIS — G4733 Obstructive sleep apnea (adult) (pediatric): Secondary | ICD-10-CM | POA: Diagnosis not present

## 2019-02-15 DIAGNOSIS — D631 Anemia in chronic kidney disease: Secondary | ICD-10-CM | POA: Diagnosis not present

## 2019-02-15 DIAGNOSIS — G709 Myoneural disorder, unspecified: Secondary | ICD-10-CM | POA: Diagnosis not present

## 2019-02-15 DIAGNOSIS — E785 Hyperlipidemia, unspecified: Secondary | ICD-10-CM | POA: Diagnosis not present

## 2019-02-15 DIAGNOSIS — D519 Vitamin B12 deficiency anemia, unspecified: Secondary | ICD-10-CM | POA: Diagnosis not present

## 2019-02-15 DIAGNOSIS — Z48815 Encounter for surgical aftercare following surgery on the digestive system: Secondary | ICD-10-CM | POA: Diagnosis not present

## 2019-02-15 DIAGNOSIS — I5032 Chronic diastolic (congestive) heart failure: Secondary | ICD-10-CM | POA: Diagnosis not present

## 2019-02-15 DIAGNOSIS — K760 Fatty (change of) liver, not elsewhere classified: Secondary | ICD-10-CM | POA: Diagnosis not present

## 2019-02-15 DIAGNOSIS — K5909 Other constipation: Secondary | ICD-10-CM | POA: Diagnosis not present

## 2019-02-15 DIAGNOSIS — K219 Gastro-esophageal reflux disease without esophagitis: Secondary | ICD-10-CM | POA: Diagnosis not present

## 2019-02-15 DIAGNOSIS — N183 Chronic kidney disease, stage 3 unspecified: Secondary | ICD-10-CM | POA: Diagnosis not present

## 2019-02-15 DIAGNOSIS — E1142 Type 2 diabetes mellitus with diabetic polyneuropathy: Secondary | ICD-10-CM | POA: Diagnosis not present

## 2019-02-15 DIAGNOSIS — I480 Paroxysmal atrial fibrillation: Secondary | ICD-10-CM | POA: Diagnosis not present

## 2019-02-15 DIAGNOSIS — M170 Bilateral primary osteoarthritis of knee: Secondary | ICD-10-CM | POA: Diagnosis not present

## 2019-02-15 DIAGNOSIS — J449 Chronic obstructive pulmonary disease, unspecified: Secondary | ICD-10-CM | POA: Diagnosis not present

## 2019-02-15 DIAGNOSIS — G894 Chronic pain syndrome: Secondary | ICD-10-CM | POA: Diagnosis not present

## 2019-02-15 DIAGNOSIS — K5790 Diverticulosis of intestine, part unspecified, without perforation or abscess without bleeding: Secondary | ICD-10-CM | POA: Diagnosis not present

## 2019-02-16 DIAGNOSIS — I5032 Chronic diastolic (congestive) heart failure: Secondary | ICD-10-CM | POA: Diagnosis not present

## 2019-02-16 DIAGNOSIS — J449 Chronic obstructive pulmonary disease, unspecified: Secondary | ICD-10-CM | POA: Diagnosis not present

## 2019-02-16 DIAGNOSIS — K219 Gastro-esophageal reflux disease without esophagitis: Secondary | ICD-10-CM | POA: Diagnosis not present

## 2019-02-16 DIAGNOSIS — I480 Paroxysmal atrial fibrillation: Secondary | ICD-10-CM | POA: Diagnosis not present

## 2019-02-16 DIAGNOSIS — K760 Fatty (change of) liver, not elsewhere classified: Secondary | ICD-10-CM | POA: Diagnosis not present

## 2019-02-16 DIAGNOSIS — M170 Bilateral primary osteoarthritis of knee: Secondary | ICD-10-CM | POA: Diagnosis not present

## 2019-02-16 DIAGNOSIS — Z48815 Encounter for surgical aftercare following surgery on the digestive system: Secondary | ICD-10-CM | POA: Diagnosis not present

## 2019-02-16 DIAGNOSIS — K5790 Diverticulosis of intestine, part unspecified, without perforation or abscess without bleeding: Secondary | ICD-10-CM | POA: Diagnosis not present

## 2019-02-16 DIAGNOSIS — D631 Anemia in chronic kidney disease: Secondary | ICD-10-CM | POA: Diagnosis not present

## 2019-02-16 DIAGNOSIS — E1122 Type 2 diabetes mellitus with diabetic chronic kidney disease: Secondary | ICD-10-CM | POA: Diagnosis not present

## 2019-02-16 DIAGNOSIS — G894 Chronic pain syndrome: Secondary | ICD-10-CM | POA: Diagnosis not present

## 2019-02-16 DIAGNOSIS — K5909 Other constipation: Secondary | ICD-10-CM | POA: Diagnosis not present

## 2019-02-16 DIAGNOSIS — N183 Chronic kidney disease, stage 3 unspecified: Secondary | ICD-10-CM | POA: Diagnosis not present

## 2019-02-16 DIAGNOSIS — E1142 Type 2 diabetes mellitus with diabetic polyneuropathy: Secondary | ICD-10-CM | POA: Diagnosis not present

## 2019-02-16 DIAGNOSIS — I130 Hypertensive heart and chronic kidney disease with heart failure and stage 1 through stage 4 chronic kidney disease, or unspecified chronic kidney disease: Secondary | ICD-10-CM | POA: Diagnosis not present

## 2019-02-16 DIAGNOSIS — G4733 Obstructive sleep apnea (adult) (pediatric): Secondary | ICD-10-CM | POA: Diagnosis not present

## 2019-02-16 DIAGNOSIS — G709 Myoneural disorder, unspecified: Secondary | ICD-10-CM | POA: Diagnosis not present

## 2019-02-16 DIAGNOSIS — E785 Hyperlipidemia, unspecified: Secondary | ICD-10-CM | POA: Diagnosis not present

## 2019-02-16 DIAGNOSIS — D519 Vitamin B12 deficiency anemia, unspecified: Secondary | ICD-10-CM | POA: Diagnosis not present

## 2019-02-17 ENCOUNTER — Other Ambulatory Visit: Payer: Self-pay | Admitting: *Deleted

## 2019-02-17 NOTE — Patient Outreach (Signed)
[facility]) Care Management  02/17/2019  Phillip Maldonado [birth year] [id number]   RED ON EMMI ALERT - General Discharge Day # 4 Date: 02/15/2019 Red Alert Reason: lost interest in things & feeling sad/anxious/hopeless   Outreach attempt #1, successful.  Referral received from care management assistant as member was recently discharged from hospital and triggered red on EMMI program for above noted reason.  Call placed to member, identity verified.  This care manager introduced self and stated purpose of call.  Noted that member does not have PCP listed, inquired about current PCP, now being seen at [facility] with [facility].  Made aware that he is not eligible for program due to PCP not being in network.  However this care manager went on to assess alert.  He report this has been a rough year dealing with the pandemic and now his recent surgery.  He got married this summer but then his wife developed Covid.  She has since recovered but she had a daughter that was out of town that passed away of the virus.  They have both had a hard time dealing with it but state he's "taking it a day at a time, dealing with it the best way I can."  Denies the need for social work involvement as he has one assigned trough [facility].  Denies any urgent concerns at this time.   Plan: RN CM will not open case as member is not eligible for services.  Valente David, [place], MSN [place] [phone]

## 2019-02-20 DIAGNOSIS — I129 Hypertensive chronic kidney disease with stage 1 through stage 4 chronic kidney disease, or unspecified chronic kidney disease: Secondary | ICD-10-CM | POA: Diagnosis not present

## 2019-02-20 DIAGNOSIS — I5032 Chronic diastolic (congestive) heart failure: Secondary | ICD-10-CM | POA: Diagnosis not present

## 2019-02-20 DIAGNOSIS — E1122 Type 2 diabetes mellitus with diabetic chronic kidney disease: Secondary | ICD-10-CM | POA: Diagnosis not present

## 2019-02-20 DIAGNOSIS — D631 Anemia in chronic kidney disease: Secondary | ICD-10-CM | POA: Diagnosis not present

## 2019-02-20 DIAGNOSIS — G4733 Obstructive sleep apnea (adult) (pediatric): Secondary | ICD-10-CM | POA: Diagnosis not present

## 2019-02-20 DIAGNOSIS — Z48815 Encounter for surgical aftercare following surgery on the digestive system: Secondary | ICD-10-CM | POA: Diagnosis not present

## 2019-02-20 DIAGNOSIS — I480 Paroxysmal atrial fibrillation: Secondary | ICD-10-CM | POA: Diagnosis not present

## 2019-02-20 DIAGNOSIS — N179 Acute kidney failure, unspecified: Secondary | ICD-10-CM | POA: Diagnosis not present

## 2019-02-20 DIAGNOSIS — K5790 Diverticulosis of intestine, part unspecified, without perforation or abscess without bleeding: Secondary | ICD-10-CM | POA: Diagnosis not present

## 2019-02-20 DIAGNOSIS — G894 Chronic pain syndrome: Secondary | ICD-10-CM | POA: Diagnosis not present

## 2019-02-20 DIAGNOSIS — K5909 Other constipation: Secondary | ICD-10-CM | POA: Diagnosis not present

## 2019-02-20 DIAGNOSIS — M170 Bilateral primary osteoarthritis of knee: Secondary | ICD-10-CM | POA: Diagnosis not present

## 2019-02-20 DIAGNOSIS — E785 Hyperlipidemia, unspecified: Secondary | ICD-10-CM | POA: Diagnosis not present

## 2019-02-20 DIAGNOSIS — E1142 Type 2 diabetes mellitus with diabetic polyneuropathy: Secondary | ICD-10-CM | POA: Diagnosis not present

## 2019-02-20 DIAGNOSIS — I130 Hypertensive heart and chronic kidney disease with heart failure and stage 1 through stage 4 chronic kidney disease, or unspecified chronic kidney disease: Secondary | ICD-10-CM | POA: Diagnosis not present

## 2019-02-20 DIAGNOSIS — E876 Hypokalemia: Secondary | ICD-10-CM | POA: Diagnosis not present

## 2019-02-20 DIAGNOSIS — G709 Myoneural disorder, unspecified: Secondary | ICD-10-CM | POA: Diagnosis not present

## 2019-02-20 DIAGNOSIS — K219 Gastro-esophageal reflux disease without esophagitis: Secondary | ICD-10-CM | POA: Diagnosis not present

## 2019-02-20 DIAGNOSIS — J449 Chronic obstructive pulmonary disease, unspecified: Secondary | ICD-10-CM | POA: Diagnosis not present

## 2019-02-20 DIAGNOSIS — D519 Vitamin B12 deficiency anemia, unspecified: Secondary | ICD-10-CM | POA: Diagnosis not present

## 2019-02-20 DIAGNOSIS — K760 Fatty (change of) liver, not elsewhere classified: Secondary | ICD-10-CM | POA: Diagnosis not present

## 2019-02-20 DIAGNOSIS — N183 Chronic kidney disease, stage 3 unspecified: Secondary | ICD-10-CM | POA: Diagnosis not present

## 2019-02-21 DIAGNOSIS — K219 Gastro-esophageal reflux disease without esophagitis: Secondary | ICD-10-CM | POA: Diagnosis not present

## 2019-02-21 DIAGNOSIS — I480 Paroxysmal atrial fibrillation: Secondary | ICD-10-CM | POA: Diagnosis not present

## 2019-02-21 DIAGNOSIS — D631 Anemia in chronic kidney disease: Secondary | ICD-10-CM | POA: Diagnosis not present

## 2019-02-21 DIAGNOSIS — G894 Chronic pain syndrome: Secondary | ICD-10-CM | POA: Diagnosis not present

## 2019-02-21 DIAGNOSIS — G709 Myoneural disorder, unspecified: Secondary | ICD-10-CM | POA: Diagnosis not present

## 2019-02-21 DIAGNOSIS — K5909 Other constipation: Secondary | ICD-10-CM | POA: Diagnosis not present

## 2019-02-21 DIAGNOSIS — Z48815 Encounter for surgical aftercare following surgery on the digestive system: Secondary | ICD-10-CM | POA: Diagnosis not present

## 2019-02-21 DIAGNOSIS — I130 Hypertensive heart and chronic kidney disease with heart failure and stage 1 through stage 4 chronic kidney disease, or unspecified chronic kidney disease: Secondary | ICD-10-CM | POA: Diagnosis not present

## 2019-02-21 DIAGNOSIS — E1122 Type 2 diabetes mellitus with diabetic chronic kidney disease: Secondary | ICD-10-CM | POA: Diagnosis not present

## 2019-02-21 DIAGNOSIS — D519 Vitamin B12 deficiency anemia, unspecified: Secondary | ICD-10-CM | POA: Diagnosis not present

## 2019-02-21 DIAGNOSIS — M170 Bilateral primary osteoarthritis of knee: Secondary | ICD-10-CM | POA: Diagnosis not present

## 2019-02-21 DIAGNOSIS — K5790 Diverticulosis of intestine, part unspecified, without perforation or abscess without bleeding: Secondary | ICD-10-CM | POA: Diagnosis not present

## 2019-02-21 DIAGNOSIS — N183 Chronic kidney disease, stage 3 unspecified: Secondary | ICD-10-CM | POA: Diagnosis not present

## 2019-02-21 DIAGNOSIS — E785 Hyperlipidemia, unspecified: Secondary | ICD-10-CM | POA: Diagnosis not present

## 2019-02-21 DIAGNOSIS — E1142 Type 2 diabetes mellitus with diabetic polyneuropathy: Secondary | ICD-10-CM | POA: Diagnosis not present

## 2019-02-21 DIAGNOSIS — J449 Chronic obstructive pulmonary disease, unspecified: Secondary | ICD-10-CM | POA: Diagnosis not present

## 2019-02-21 DIAGNOSIS — I5032 Chronic diastolic (congestive) heart failure: Secondary | ICD-10-CM | POA: Diagnosis not present

## 2019-02-21 DIAGNOSIS — G4733 Obstructive sleep apnea (adult) (pediatric): Secondary | ICD-10-CM | POA: Diagnosis not present

## 2019-02-21 DIAGNOSIS — K760 Fatty (change of) liver, not elsewhere classified: Secondary | ICD-10-CM | POA: Diagnosis not present

## 2019-02-22 DIAGNOSIS — I480 Paroxysmal atrial fibrillation: Secondary | ICD-10-CM | POA: Diagnosis not present

## 2019-02-22 DIAGNOSIS — M170 Bilateral primary osteoarthritis of knee: Secondary | ICD-10-CM | POA: Diagnosis not present

## 2019-02-22 DIAGNOSIS — E785 Hyperlipidemia, unspecified: Secondary | ICD-10-CM | POA: Diagnosis not present

## 2019-02-22 DIAGNOSIS — K219 Gastro-esophageal reflux disease without esophagitis: Secondary | ICD-10-CM | POA: Diagnosis not present

## 2019-02-22 DIAGNOSIS — N183 Chronic kidney disease, stage 3 unspecified: Secondary | ICD-10-CM | POA: Diagnosis not present

## 2019-02-22 DIAGNOSIS — I5032 Chronic diastolic (congestive) heart failure: Secondary | ICD-10-CM | POA: Diagnosis not present

## 2019-02-22 DIAGNOSIS — K5909 Other constipation: Secondary | ICD-10-CM | POA: Diagnosis not present

## 2019-02-22 DIAGNOSIS — E1142 Type 2 diabetes mellitus with diabetic polyneuropathy: Secondary | ICD-10-CM | POA: Diagnosis not present

## 2019-02-22 DIAGNOSIS — D519 Vitamin B12 deficiency anemia, unspecified: Secondary | ICD-10-CM | POA: Diagnosis not present

## 2019-02-22 DIAGNOSIS — K5790 Diverticulosis of intestine, part unspecified, without perforation or abscess without bleeding: Secondary | ICD-10-CM | POA: Diagnosis not present

## 2019-02-22 DIAGNOSIS — K760 Fatty (change of) liver, not elsewhere classified: Secondary | ICD-10-CM | POA: Diagnosis not present

## 2019-02-22 DIAGNOSIS — G709 Myoneural disorder, unspecified: Secondary | ICD-10-CM | POA: Diagnosis not present

## 2019-02-22 DIAGNOSIS — G4733 Obstructive sleep apnea (adult) (pediatric): Secondary | ICD-10-CM | POA: Diagnosis not present

## 2019-02-22 DIAGNOSIS — D631 Anemia in chronic kidney disease: Secondary | ICD-10-CM | POA: Diagnosis not present

## 2019-02-22 DIAGNOSIS — J449 Chronic obstructive pulmonary disease, unspecified: Secondary | ICD-10-CM | POA: Diagnosis not present

## 2019-02-22 DIAGNOSIS — I130 Hypertensive heart and chronic kidney disease with heart failure and stage 1 through stage 4 chronic kidney disease, or unspecified chronic kidney disease: Secondary | ICD-10-CM | POA: Diagnosis not present

## 2019-02-22 DIAGNOSIS — E1122 Type 2 diabetes mellitus with diabetic chronic kidney disease: Secondary | ICD-10-CM | POA: Diagnosis not present

## 2019-02-22 DIAGNOSIS — Z48815 Encounter for surgical aftercare following surgery on the digestive system: Secondary | ICD-10-CM | POA: Diagnosis not present

## 2019-02-22 DIAGNOSIS — G894 Chronic pain syndrome: Secondary | ICD-10-CM | POA: Diagnosis not present

## 2019-02-23 DIAGNOSIS — Z48815 Encounter for surgical aftercare following surgery on the digestive system: Secondary | ICD-10-CM | POA: Diagnosis not present

## 2019-02-23 DIAGNOSIS — D519 Vitamin B12 deficiency anemia, unspecified: Secondary | ICD-10-CM | POA: Diagnosis not present

## 2019-02-23 DIAGNOSIS — I5032 Chronic diastolic (congestive) heart failure: Secondary | ICD-10-CM | POA: Diagnosis not present

## 2019-02-23 DIAGNOSIS — N183 Chronic kidney disease, stage 3 unspecified: Secondary | ICD-10-CM | POA: Diagnosis not present

## 2019-02-23 DIAGNOSIS — I480 Paroxysmal atrial fibrillation: Secondary | ICD-10-CM | POA: Diagnosis not present

## 2019-02-23 DIAGNOSIS — E1122 Type 2 diabetes mellitus with diabetic chronic kidney disease: Secondary | ICD-10-CM | POA: Diagnosis not present

## 2019-02-23 DIAGNOSIS — E785 Hyperlipidemia, unspecified: Secondary | ICD-10-CM | POA: Diagnosis not present

## 2019-02-23 DIAGNOSIS — E1142 Type 2 diabetes mellitus with diabetic polyneuropathy: Secondary | ICD-10-CM | POA: Diagnosis not present

## 2019-02-23 DIAGNOSIS — G4733 Obstructive sleep apnea (adult) (pediatric): Secondary | ICD-10-CM | POA: Diagnosis not present

## 2019-02-23 DIAGNOSIS — G894 Chronic pain syndrome: Secondary | ICD-10-CM | POA: Diagnosis not present

## 2019-02-23 DIAGNOSIS — K5909 Other constipation: Secondary | ICD-10-CM | POA: Diagnosis not present

## 2019-02-23 DIAGNOSIS — I130 Hypertensive heart and chronic kidney disease with heart failure and stage 1 through stage 4 chronic kidney disease, or unspecified chronic kidney disease: Secondary | ICD-10-CM | POA: Diagnosis not present

## 2019-02-23 DIAGNOSIS — G709 Myoneural disorder, unspecified: Secondary | ICD-10-CM | POA: Diagnosis not present

## 2019-02-23 DIAGNOSIS — J449 Chronic obstructive pulmonary disease, unspecified: Secondary | ICD-10-CM | POA: Diagnosis not present

## 2019-02-23 DIAGNOSIS — K5790 Diverticulosis of intestine, part unspecified, without perforation or abscess without bleeding: Secondary | ICD-10-CM | POA: Diagnosis not present

## 2019-02-23 DIAGNOSIS — D631 Anemia in chronic kidney disease: Secondary | ICD-10-CM | POA: Diagnosis not present

## 2019-02-23 DIAGNOSIS — K760 Fatty (change of) liver, not elsewhere classified: Secondary | ICD-10-CM | POA: Diagnosis not present

## 2019-02-23 DIAGNOSIS — K219 Gastro-esophageal reflux disease without esophagitis: Secondary | ICD-10-CM | POA: Diagnosis not present

## 2019-02-23 DIAGNOSIS — M170 Bilateral primary osteoarthritis of knee: Secondary | ICD-10-CM | POA: Diagnosis not present

## 2019-02-24 NOTE — Telephone Encounter (Signed)
Patient has been scheduled for 03/29/18 10:50

## 2019-02-27 DIAGNOSIS — I5032 Chronic diastolic (congestive) heart failure: Secondary | ICD-10-CM | POA: Diagnosis not present

## 2019-02-27 DIAGNOSIS — I480 Paroxysmal atrial fibrillation: Secondary | ICD-10-CM | POA: Diagnosis not present

## 2019-02-27 DIAGNOSIS — N183 Chronic kidney disease, stage 3 unspecified: Secondary | ICD-10-CM | POA: Diagnosis not present

## 2019-02-27 DIAGNOSIS — K5909 Other constipation: Secondary | ICD-10-CM | POA: Diagnosis not present

## 2019-02-27 DIAGNOSIS — E1142 Type 2 diabetes mellitus with diabetic polyneuropathy: Secondary | ICD-10-CM | POA: Diagnosis not present

## 2019-02-27 DIAGNOSIS — E785 Hyperlipidemia, unspecified: Secondary | ICD-10-CM | POA: Diagnosis not present

## 2019-02-27 DIAGNOSIS — K219 Gastro-esophageal reflux disease without esophagitis: Secondary | ICD-10-CM | POA: Diagnosis not present

## 2019-02-27 DIAGNOSIS — E1122 Type 2 diabetes mellitus with diabetic chronic kidney disease: Secondary | ICD-10-CM | POA: Diagnosis not present

## 2019-02-27 DIAGNOSIS — D519 Vitamin B12 deficiency anemia, unspecified: Secondary | ICD-10-CM | POA: Diagnosis not present

## 2019-02-27 DIAGNOSIS — G894 Chronic pain syndrome: Secondary | ICD-10-CM | POA: Diagnosis not present

## 2019-02-27 DIAGNOSIS — J449 Chronic obstructive pulmonary disease, unspecified: Secondary | ICD-10-CM | POA: Diagnosis not present

## 2019-02-27 DIAGNOSIS — M170 Bilateral primary osteoarthritis of knee: Secondary | ICD-10-CM | POA: Diagnosis not present

## 2019-02-27 DIAGNOSIS — I130 Hypertensive heart and chronic kidney disease with heart failure and stage 1 through stage 4 chronic kidney disease, or unspecified chronic kidney disease: Secondary | ICD-10-CM | POA: Diagnosis not present

## 2019-02-27 DIAGNOSIS — K760 Fatty (change of) liver, not elsewhere classified: Secondary | ICD-10-CM | POA: Diagnosis not present

## 2019-02-27 DIAGNOSIS — D631 Anemia in chronic kidney disease: Secondary | ICD-10-CM | POA: Diagnosis not present

## 2019-02-27 DIAGNOSIS — Z48815 Encounter for surgical aftercare following surgery on the digestive system: Secondary | ICD-10-CM | POA: Diagnosis not present

## 2019-02-27 DIAGNOSIS — K5790 Diverticulosis of intestine, part unspecified, without perforation or abscess without bleeding: Secondary | ICD-10-CM | POA: Diagnosis not present

## 2019-02-27 DIAGNOSIS — G709 Myoneural disorder, unspecified: Secondary | ICD-10-CM | POA: Diagnosis not present

## 2019-02-27 DIAGNOSIS — G4733 Obstructive sleep apnea (adult) (pediatric): Secondary | ICD-10-CM | POA: Diagnosis not present

## 2019-02-28 DIAGNOSIS — N183 Chronic kidney disease, stage 3 unspecified: Secondary | ICD-10-CM | POA: Diagnosis not present

## 2019-02-28 DIAGNOSIS — D519 Vitamin B12 deficiency anemia, unspecified: Secondary | ICD-10-CM | POA: Diagnosis not present

## 2019-02-28 DIAGNOSIS — Z48815 Encounter for surgical aftercare following surgery on the digestive system: Secondary | ICD-10-CM | POA: Diagnosis not present

## 2019-02-28 DIAGNOSIS — G709 Myoneural disorder, unspecified: Secondary | ICD-10-CM | POA: Diagnosis not present

## 2019-02-28 DIAGNOSIS — K5790 Diverticulosis of intestine, part unspecified, without perforation or abscess without bleeding: Secondary | ICD-10-CM | POA: Diagnosis not present

## 2019-02-28 DIAGNOSIS — K219 Gastro-esophageal reflux disease without esophagitis: Secondary | ICD-10-CM | POA: Diagnosis not present

## 2019-02-28 DIAGNOSIS — I5032 Chronic diastolic (congestive) heart failure: Secondary | ICD-10-CM | POA: Diagnosis not present

## 2019-02-28 DIAGNOSIS — D631 Anemia in chronic kidney disease: Secondary | ICD-10-CM | POA: Diagnosis not present

## 2019-02-28 DIAGNOSIS — J449 Chronic obstructive pulmonary disease, unspecified: Secondary | ICD-10-CM | POA: Diagnosis not present

## 2019-02-28 DIAGNOSIS — I480 Paroxysmal atrial fibrillation: Secondary | ICD-10-CM | POA: Diagnosis not present

## 2019-02-28 DIAGNOSIS — G894 Chronic pain syndrome: Secondary | ICD-10-CM | POA: Diagnosis not present

## 2019-02-28 DIAGNOSIS — M170 Bilateral primary osteoarthritis of knee: Secondary | ICD-10-CM | POA: Diagnosis not present

## 2019-02-28 DIAGNOSIS — I130 Hypertensive heart and chronic kidney disease with heart failure and stage 1 through stage 4 chronic kidney disease, or unspecified chronic kidney disease: Secondary | ICD-10-CM | POA: Diagnosis not present

## 2019-02-28 DIAGNOSIS — E785 Hyperlipidemia, unspecified: Secondary | ICD-10-CM | POA: Diagnosis not present

## 2019-02-28 DIAGNOSIS — K5909 Other constipation: Secondary | ICD-10-CM | POA: Diagnosis not present

## 2019-02-28 DIAGNOSIS — K760 Fatty (change of) liver, not elsewhere classified: Secondary | ICD-10-CM | POA: Diagnosis not present

## 2019-02-28 DIAGNOSIS — E1142 Type 2 diabetes mellitus with diabetic polyneuropathy: Secondary | ICD-10-CM | POA: Diagnosis not present

## 2019-02-28 DIAGNOSIS — E1122 Type 2 diabetes mellitus with diabetic chronic kidney disease: Secondary | ICD-10-CM | POA: Diagnosis not present

## 2019-02-28 DIAGNOSIS — G4733 Obstructive sleep apnea (adult) (pediatric): Secondary | ICD-10-CM | POA: Diagnosis not present

## 2019-03-02 DIAGNOSIS — I480 Paroxysmal atrial fibrillation: Secondary | ICD-10-CM | POA: Diagnosis not present

## 2019-03-02 DIAGNOSIS — G709 Myoneural disorder, unspecified: Secondary | ICD-10-CM | POA: Diagnosis not present

## 2019-03-02 DIAGNOSIS — D519 Vitamin B12 deficiency anemia, unspecified: Secondary | ICD-10-CM | POA: Diagnosis not present

## 2019-03-02 DIAGNOSIS — K5790 Diverticulosis of intestine, part unspecified, without perforation or abscess without bleeding: Secondary | ICD-10-CM | POA: Diagnosis not present

## 2019-03-02 DIAGNOSIS — Z48815 Encounter for surgical aftercare following surgery on the digestive system: Secondary | ICD-10-CM | POA: Diagnosis not present

## 2019-03-02 DIAGNOSIS — K219 Gastro-esophageal reflux disease without esophagitis: Secondary | ICD-10-CM | POA: Diagnosis not present

## 2019-03-02 DIAGNOSIS — M170 Bilateral primary osteoarthritis of knee: Secondary | ICD-10-CM | POA: Diagnosis not present

## 2019-03-02 DIAGNOSIS — I5032 Chronic diastolic (congestive) heart failure: Secondary | ICD-10-CM | POA: Diagnosis not present

## 2019-03-02 DIAGNOSIS — K5909 Other constipation: Secondary | ICD-10-CM | POA: Diagnosis not present

## 2019-03-02 DIAGNOSIS — G894 Chronic pain syndrome: Secondary | ICD-10-CM | POA: Diagnosis not present

## 2019-03-02 DIAGNOSIS — I130 Hypertensive heart and chronic kidney disease with heart failure and stage 1 through stage 4 chronic kidney disease, or unspecified chronic kidney disease: Secondary | ICD-10-CM | POA: Diagnosis not present

## 2019-03-02 DIAGNOSIS — J449 Chronic obstructive pulmonary disease, unspecified: Secondary | ICD-10-CM | POA: Diagnosis not present

## 2019-03-02 DIAGNOSIS — E1142 Type 2 diabetes mellitus with diabetic polyneuropathy: Secondary | ICD-10-CM | POA: Diagnosis not present

## 2019-03-02 DIAGNOSIS — E1122 Type 2 diabetes mellitus with diabetic chronic kidney disease: Secondary | ICD-10-CM | POA: Diagnosis not present

## 2019-03-02 DIAGNOSIS — D631 Anemia in chronic kidney disease: Secondary | ICD-10-CM | POA: Diagnosis not present

## 2019-03-02 DIAGNOSIS — E785 Hyperlipidemia, unspecified: Secondary | ICD-10-CM | POA: Diagnosis not present

## 2019-03-02 DIAGNOSIS — K760 Fatty (change of) liver, not elsewhere classified: Secondary | ICD-10-CM | POA: Diagnosis not present

## 2019-03-02 DIAGNOSIS — G4733 Obstructive sleep apnea (adult) (pediatric): Secondary | ICD-10-CM | POA: Diagnosis not present

## 2019-03-02 DIAGNOSIS — N183 Chronic kidney disease, stage 3 unspecified: Secondary | ICD-10-CM | POA: Diagnosis not present

## 2019-03-03 DIAGNOSIS — K760 Fatty (change of) liver, not elsewhere classified: Secondary | ICD-10-CM | POA: Diagnosis not present

## 2019-03-03 DIAGNOSIS — E1122 Type 2 diabetes mellitus with diabetic chronic kidney disease: Secondary | ICD-10-CM | POA: Diagnosis not present

## 2019-03-03 DIAGNOSIS — G4733 Obstructive sleep apnea (adult) (pediatric): Secondary | ICD-10-CM | POA: Diagnosis not present

## 2019-03-03 DIAGNOSIS — I480 Paroxysmal atrial fibrillation: Secondary | ICD-10-CM | POA: Diagnosis not present

## 2019-03-03 DIAGNOSIS — E1142 Type 2 diabetes mellitus with diabetic polyneuropathy: Secondary | ICD-10-CM | POA: Diagnosis not present

## 2019-03-03 DIAGNOSIS — Z48815 Encounter for surgical aftercare following surgery on the digestive system: Secondary | ICD-10-CM | POA: Diagnosis not present

## 2019-03-03 DIAGNOSIS — G894 Chronic pain syndrome: Secondary | ICD-10-CM | POA: Diagnosis not present

## 2019-03-03 DIAGNOSIS — G709 Myoneural disorder, unspecified: Secondary | ICD-10-CM | POA: Diagnosis not present

## 2019-03-03 DIAGNOSIS — K219 Gastro-esophageal reflux disease without esophagitis: Secondary | ICD-10-CM | POA: Diagnosis not present

## 2019-03-03 DIAGNOSIS — M170 Bilateral primary osteoarthritis of knee: Secondary | ICD-10-CM | POA: Diagnosis not present

## 2019-03-03 DIAGNOSIS — N183 Chronic kidney disease, stage 3 unspecified: Secondary | ICD-10-CM | POA: Diagnosis not present

## 2019-03-03 DIAGNOSIS — K5790 Diverticulosis of intestine, part unspecified, without perforation or abscess without bleeding: Secondary | ICD-10-CM | POA: Diagnosis not present

## 2019-03-03 DIAGNOSIS — K5909 Other constipation: Secondary | ICD-10-CM | POA: Diagnosis not present

## 2019-03-03 DIAGNOSIS — I130 Hypertensive heart and chronic kidney disease with heart failure and stage 1 through stage 4 chronic kidney disease, or unspecified chronic kidney disease: Secondary | ICD-10-CM | POA: Diagnosis not present

## 2019-03-03 DIAGNOSIS — E785 Hyperlipidemia, unspecified: Secondary | ICD-10-CM | POA: Diagnosis not present

## 2019-03-03 DIAGNOSIS — D631 Anemia in chronic kidney disease: Secondary | ICD-10-CM | POA: Diagnosis not present

## 2019-03-03 DIAGNOSIS — I5032 Chronic diastolic (congestive) heart failure: Secondary | ICD-10-CM | POA: Diagnosis not present

## 2019-03-03 DIAGNOSIS — J449 Chronic obstructive pulmonary disease, unspecified: Secondary | ICD-10-CM | POA: Diagnosis not present

## 2019-03-03 DIAGNOSIS — D519 Vitamin B12 deficiency anemia, unspecified: Secondary | ICD-10-CM | POA: Diagnosis not present

## 2019-03-06 DIAGNOSIS — M170 Bilateral primary osteoarthritis of knee: Secondary | ICD-10-CM | POA: Diagnosis not present

## 2019-03-06 DIAGNOSIS — K760 Fatty (change of) liver, not elsewhere classified: Secondary | ICD-10-CM | POA: Diagnosis not present

## 2019-03-06 DIAGNOSIS — G894 Chronic pain syndrome: Secondary | ICD-10-CM | POA: Diagnosis not present

## 2019-03-06 DIAGNOSIS — J449 Chronic obstructive pulmonary disease, unspecified: Secondary | ICD-10-CM | POA: Diagnosis not present

## 2019-03-06 DIAGNOSIS — K219 Gastro-esophageal reflux disease without esophagitis: Secondary | ICD-10-CM | POA: Diagnosis not present

## 2019-03-06 DIAGNOSIS — E1142 Type 2 diabetes mellitus with diabetic polyneuropathy: Secondary | ICD-10-CM | POA: Diagnosis not present

## 2019-03-06 DIAGNOSIS — Z48815 Encounter for surgical aftercare following surgery on the digestive system: Secondary | ICD-10-CM | POA: Diagnosis not present

## 2019-03-06 DIAGNOSIS — D519 Vitamin B12 deficiency anemia, unspecified: Secondary | ICD-10-CM | POA: Diagnosis not present

## 2019-03-06 DIAGNOSIS — K5909 Other constipation: Secondary | ICD-10-CM | POA: Diagnosis not present

## 2019-03-06 DIAGNOSIS — D631 Anemia in chronic kidney disease: Secondary | ICD-10-CM | POA: Diagnosis not present

## 2019-03-06 DIAGNOSIS — G4733 Obstructive sleep apnea (adult) (pediatric): Secondary | ICD-10-CM | POA: Diagnosis not present

## 2019-03-06 DIAGNOSIS — E785 Hyperlipidemia, unspecified: Secondary | ICD-10-CM | POA: Diagnosis not present

## 2019-03-06 DIAGNOSIS — I480 Paroxysmal atrial fibrillation: Secondary | ICD-10-CM | POA: Diagnosis not present

## 2019-03-06 DIAGNOSIS — K5790 Diverticulosis of intestine, part unspecified, without perforation or abscess without bleeding: Secondary | ICD-10-CM | POA: Diagnosis not present

## 2019-03-06 DIAGNOSIS — E1122 Type 2 diabetes mellitus with diabetic chronic kidney disease: Secondary | ICD-10-CM | POA: Diagnosis not present

## 2019-03-06 DIAGNOSIS — I130 Hypertensive heart and chronic kidney disease with heart failure and stage 1 through stage 4 chronic kidney disease, or unspecified chronic kidney disease: Secondary | ICD-10-CM | POA: Diagnosis not present

## 2019-03-06 DIAGNOSIS — G709 Myoneural disorder, unspecified: Secondary | ICD-10-CM | POA: Diagnosis not present

## 2019-03-06 DIAGNOSIS — N183 Chronic kidney disease, stage 3 unspecified: Secondary | ICD-10-CM | POA: Diagnosis not present

## 2019-03-06 DIAGNOSIS — I5032 Chronic diastolic (congestive) heart failure: Secondary | ICD-10-CM | POA: Diagnosis not present

## 2019-03-07 DIAGNOSIS — I130 Hypertensive heart and chronic kidney disease with heart failure and stage 1 through stage 4 chronic kidney disease, or unspecified chronic kidney disease: Secondary | ICD-10-CM | POA: Diagnosis not present

## 2019-03-07 DIAGNOSIS — G4733 Obstructive sleep apnea (adult) (pediatric): Secondary | ICD-10-CM | POA: Diagnosis not present

## 2019-03-07 DIAGNOSIS — N183 Chronic kidney disease, stage 3 unspecified: Secondary | ICD-10-CM | POA: Diagnosis not present

## 2019-03-07 DIAGNOSIS — K219 Gastro-esophageal reflux disease without esophagitis: Secondary | ICD-10-CM | POA: Diagnosis not present

## 2019-03-07 DIAGNOSIS — G894 Chronic pain syndrome: Secondary | ICD-10-CM | POA: Diagnosis not present

## 2019-03-07 DIAGNOSIS — K5790 Diverticulosis of intestine, part unspecified, without perforation or abscess without bleeding: Secondary | ICD-10-CM | POA: Diagnosis not present

## 2019-03-07 DIAGNOSIS — M170 Bilateral primary osteoarthritis of knee: Secondary | ICD-10-CM | POA: Diagnosis not present

## 2019-03-07 DIAGNOSIS — I480 Paroxysmal atrial fibrillation: Secondary | ICD-10-CM | POA: Diagnosis not present

## 2019-03-07 DIAGNOSIS — D631 Anemia in chronic kidney disease: Secondary | ICD-10-CM | POA: Diagnosis not present

## 2019-03-07 DIAGNOSIS — K5909 Other constipation: Secondary | ICD-10-CM | POA: Diagnosis not present

## 2019-03-07 DIAGNOSIS — E1142 Type 2 diabetes mellitus with diabetic polyneuropathy: Secondary | ICD-10-CM | POA: Diagnosis not present

## 2019-03-07 DIAGNOSIS — E1122 Type 2 diabetes mellitus with diabetic chronic kidney disease: Secondary | ICD-10-CM | POA: Diagnosis not present

## 2019-03-07 DIAGNOSIS — K760 Fatty (change of) liver, not elsewhere classified: Secondary | ICD-10-CM | POA: Diagnosis not present

## 2019-03-07 DIAGNOSIS — I5032 Chronic diastolic (congestive) heart failure: Secondary | ICD-10-CM | POA: Diagnosis not present

## 2019-03-07 DIAGNOSIS — G709 Myoneural disorder, unspecified: Secondary | ICD-10-CM | POA: Diagnosis not present

## 2019-03-07 DIAGNOSIS — J449 Chronic obstructive pulmonary disease, unspecified: Secondary | ICD-10-CM | POA: Diagnosis not present

## 2019-03-07 DIAGNOSIS — D519 Vitamin B12 deficiency anemia, unspecified: Secondary | ICD-10-CM | POA: Diagnosis not present

## 2019-03-07 DIAGNOSIS — Z48815 Encounter for surgical aftercare following surgery on the digestive system: Secondary | ICD-10-CM | POA: Diagnosis not present

## 2019-03-07 DIAGNOSIS — E785 Hyperlipidemia, unspecified: Secondary | ICD-10-CM | POA: Diagnosis not present

## 2019-03-08 DIAGNOSIS — E1169 Type 2 diabetes mellitus with other specified complication: Secondary | ICD-10-CM | POA: Diagnosis not present

## 2019-03-08 DIAGNOSIS — I5032 Chronic diastolic (congestive) heart failure: Secondary | ICD-10-CM | POA: Diagnosis not present

## 2019-03-08 DIAGNOSIS — R531 Weakness: Secondary | ICD-10-CM | POA: Diagnosis not present

## 2019-03-08 DIAGNOSIS — R7989 Other specified abnormal findings of blood chemistry: Secondary | ICD-10-CM | POA: Diagnosis not present

## 2019-03-09 DIAGNOSIS — Z48815 Encounter for surgical aftercare following surgery on the digestive system: Secondary | ICD-10-CM | POA: Diagnosis not present

## 2019-03-09 DIAGNOSIS — K5790 Diverticulosis of intestine, part unspecified, without perforation or abscess without bleeding: Secondary | ICD-10-CM | POA: Diagnosis not present

## 2019-03-09 DIAGNOSIS — N183 Chronic kidney disease, stage 3 unspecified: Secondary | ICD-10-CM | POA: Diagnosis not present

## 2019-03-09 DIAGNOSIS — G894 Chronic pain syndrome: Secondary | ICD-10-CM | POA: Diagnosis not present

## 2019-03-09 DIAGNOSIS — I480 Paroxysmal atrial fibrillation: Secondary | ICD-10-CM | POA: Diagnosis not present

## 2019-03-09 DIAGNOSIS — K760 Fatty (change of) liver, not elsewhere classified: Secondary | ICD-10-CM | POA: Diagnosis not present

## 2019-03-09 DIAGNOSIS — D631 Anemia in chronic kidney disease: Secondary | ICD-10-CM | POA: Diagnosis not present

## 2019-03-09 DIAGNOSIS — E1122 Type 2 diabetes mellitus with diabetic chronic kidney disease: Secondary | ICD-10-CM | POA: Diagnosis not present

## 2019-03-09 DIAGNOSIS — K219 Gastro-esophageal reflux disease without esophagitis: Secondary | ICD-10-CM | POA: Diagnosis not present

## 2019-03-09 DIAGNOSIS — I130 Hypertensive heart and chronic kidney disease with heart failure and stage 1 through stage 4 chronic kidney disease, or unspecified chronic kidney disease: Secondary | ICD-10-CM | POA: Diagnosis not present

## 2019-03-09 DIAGNOSIS — E1142 Type 2 diabetes mellitus with diabetic polyneuropathy: Secondary | ICD-10-CM | POA: Diagnosis not present

## 2019-03-09 DIAGNOSIS — D519 Vitamin B12 deficiency anemia, unspecified: Secondary | ICD-10-CM | POA: Diagnosis not present

## 2019-03-09 DIAGNOSIS — E785 Hyperlipidemia, unspecified: Secondary | ICD-10-CM | POA: Diagnosis not present

## 2019-03-09 DIAGNOSIS — M170 Bilateral primary osteoarthritis of knee: Secondary | ICD-10-CM | POA: Diagnosis not present

## 2019-03-09 DIAGNOSIS — K5909 Other constipation: Secondary | ICD-10-CM | POA: Diagnosis not present

## 2019-03-09 DIAGNOSIS — G4733 Obstructive sleep apnea (adult) (pediatric): Secondary | ICD-10-CM | POA: Diagnosis not present

## 2019-03-09 DIAGNOSIS — I5032 Chronic diastolic (congestive) heart failure: Secondary | ICD-10-CM | POA: Diagnosis not present

## 2019-03-09 DIAGNOSIS — J449 Chronic obstructive pulmonary disease, unspecified: Secondary | ICD-10-CM | POA: Diagnosis not present

## 2019-03-09 DIAGNOSIS — G709 Myoneural disorder, unspecified: Secondary | ICD-10-CM | POA: Diagnosis not present

## 2019-03-14 DIAGNOSIS — G894 Chronic pain syndrome: Secondary | ICD-10-CM | POA: Diagnosis not present

## 2019-03-14 DIAGNOSIS — N183 Chronic kidney disease, stage 3 unspecified: Secondary | ICD-10-CM | POA: Diagnosis not present

## 2019-03-14 DIAGNOSIS — E1142 Type 2 diabetes mellitus with diabetic polyneuropathy: Secondary | ICD-10-CM | POA: Diagnosis not present

## 2019-03-14 DIAGNOSIS — J449 Chronic obstructive pulmonary disease, unspecified: Secondary | ICD-10-CM | POA: Diagnosis not present

## 2019-03-14 DIAGNOSIS — E1122 Type 2 diabetes mellitus with diabetic chronic kidney disease: Secondary | ICD-10-CM | POA: Diagnosis not present

## 2019-03-14 DIAGNOSIS — K760 Fatty (change of) liver, not elsewhere classified: Secondary | ICD-10-CM | POA: Diagnosis not present

## 2019-03-14 DIAGNOSIS — D519 Vitamin B12 deficiency anemia, unspecified: Secondary | ICD-10-CM | POA: Diagnosis not present

## 2019-03-14 DIAGNOSIS — E785 Hyperlipidemia, unspecified: Secondary | ICD-10-CM | POA: Diagnosis not present

## 2019-03-14 DIAGNOSIS — M170 Bilateral primary osteoarthritis of knee: Secondary | ICD-10-CM | POA: Diagnosis not present

## 2019-03-14 DIAGNOSIS — K219 Gastro-esophageal reflux disease without esophagitis: Secondary | ICD-10-CM | POA: Diagnosis not present

## 2019-03-14 DIAGNOSIS — K5909 Other constipation: Secondary | ICD-10-CM | POA: Diagnosis not present

## 2019-03-14 DIAGNOSIS — G4733 Obstructive sleep apnea (adult) (pediatric): Secondary | ICD-10-CM | POA: Diagnosis not present

## 2019-03-14 DIAGNOSIS — G709 Myoneural disorder, unspecified: Secondary | ICD-10-CM | POA: Diagnosis not present

## 2019-03-14 DIAGNOSIS — Z48815 Encounter for surgical aftercare following surgery on the digestive system: Secondary | ICD-10-CM | POA: Diagnosis not present

## 2019-03-14 DIAGNOSIS — I480 Paroxysmal atrial fibrillation: Secondary | ICD-10-CM | POA: Diagnosis not present

## 2019-03-14 DIAGNOSIS — I5032 Chronic diastolic (congestive) heart failure: Secondary | ICD-10-CM | POA: Diagnosis not present

## 2019-03-14 DIAGNOSIS — D631 Anemia in chronic kidney disease: Secondary | ICD-10-CM | POA: Diagnosis not present

## 2019-03-14 DIAGNOSIS — K5790 Diverticulosis of intestine, part unspecified, without perforation or abscess without bleeding: Secondary | ICD-10-CM | POA: Diagnosis not present

## 2019-03-14 DIAGNOSIS — I130 Hypertensive heart and chronic kidney disease with heart failure and stage 1 through stage 4 chronic kidney disease, or unspecified chronic kidney disease: Secondary | ICD-10-CM | POA: Diagnosis not present

## 2019-03-15 DIAGNOSIS — K5790 Diverticulosis of intestine, part unspecified, without perforation or abscess without bleeding: Secondary | ICD-10-CM | POA: Diagnosis not present

## 2019-03-15 DIAGNOSIS — E1122 Type 2 diabetes mellitus with diabetic chronic kidney disease: Secondary | ICD-10-CM | POA: Diagnosis not present

## 2019-03-15 DIAGNOSIS — I480 Paroxysmal atrial fibrillation: Secondary | ICD-10-CM | POA: Diagnosis not present

## 2019-03-15 DIAGNOSIS — N183 Chronic kidney disease, stage 3 unspecified: Secondary | ICD-10-CM | POA: Diagnosis not present

## 2019-03-15 DIAGNOSIS — G4733 Obstructive sleep apnea (adult) (pediatric): Secondary | ICD-10-CM | POA: Diagnosis not present

## 2019-03-15 DIAGNOSIS — I130 Hypertensive heart and chronic kidney disease with heart failure and stage 1 through stage 4 chronic kidney disease, or unspecified chronic kidney disease: Secondary | ICD-10-CM | POA: Diagnosis not present

## 2019-03-15 DIAGNOSIS — D519 Vitamin B12 deficiency anemia, unspecified: Secondary | ICD-10-CM | POA: Diagnosis not present

## 2019-03-15 DIAGNOSIS — G894 Chronic pain syndrome: Secondary | ICD-10-CM | POA: Diagnosis not present

## 2019-03-15 DIAGNOSIS — K5909 Other constipation: Secondary | ICD-10-CM | POA: Diagnosis not present

## 2019-03-15 DIAGNOSIS — I5032 Chronic diastolic (congestive) heart failure: Secondary | ICD-10-CM | POA: Diagnosis not present

## 2019-03-15 DIAGNOSIS — Z48815 Encounter for surgical aftercare following surgery on the digestive system: Secondary | ICD-10-CM | POA: Diagnosis not present

## 2019-03-15 DIAGNOSIS — G709 Myoneural disorder, unspecified: Secondary | ICD-10-CM | POA: Diagnosis not present

## 2019-03-15 DIAGNOSIS — E785 Hyperlipidemia, unspecified: Secondary | ICD-10-CM | POA: Diagnosis not present

## 2019-03-15 DIAGNOSIS — E1142 Type 2 diabetes mellitus with diabetic polyneuropathy: Secondary | ICD-10-CM | POA: Diagnosis not present

## 2019-03-15 DIAGNOSIS — M170 Bilateral primary osteoarthritis of knee: Secondary | ICD-10-CM | POA: Diagnosis not present

## 2019-03-15 DIAGNOSIS — K760 Fatty (change of) liver, not elsewhere classified: Secondary | ICD-10-CM | POA: Diagnosis not present

## 2019-03-15 DIAGNOSIS — J449 Chronic obstructive pulmonary disease, unspecified: Secondary | ICD-10-CM | POA: Diagnosis not present

## 2019-03-15 DIAGNOSIS — D631 Anemia in chronic kidney disease: Secondary | ICD-10-CM | POA: Diagnosis not present

## 2019-03-15 DIAGNOSIS — K219 Gastro-esophageal reflux disease without esophagitis: Secondary | ICD-10-CM | POA: Diagnosis not present

## 2019-03-16 DIAGNOSIS — G4733 Obstructive sleep apnea (adult) (pediatric): Secondary | ICD-10-CM | POA: Diagnosis not present

## 2019-03-16 DIAGNOSIS — J449 Chronic obstructive pulmonary disease, unspecified: Secondary | ICD-10-CM | POA: Diagnosis not present

## 2019-03-16 DIAGNOSIS — G894 Chronic pain syndrome: Secondary | ICD-10-CM | POA: Diagnosis not present

## 2019-03-16 DIAGNOSIS — K760 Fatty (change of) liver, not elsewhere classified: Secondary | ICD-10-CM | POA: Diagnosis not present

## 2019-03-16 DIAGNOSIS — D631 Anemia in chronic kidney disease: Secondary | ICD-10-CM | POA: Diagnosis not present

## 2019-03-16 DIAGNOSIS — K219 Gastro-esophageal reflux disease without esophagitis: Secondary | ICD-10-CM | POA: Diagnosis not present

## 2019-03-16 DIAGNOSIS — K5790 Diverticulosis of intestine, part unspecified, without perforation or abscess without bleeding: Secondary | ICD-10-CM | POA: Diagnosis not present

## 2019-03-16 DIAGNOSIS — I480 Paroxysmal atrial fibrillation: Secondary | ICD-10-CM | POA: Diagnosis not present

## 2019-03-16 DIAGNOSIS — D519 Vitamin B12 deficiency anemia, unspecified: Secondary | ICD-10-CM | POA: Diagnosis not present

## 2019-03-16 DIAGNOSIS — E1142 Type 2 diabetes mellitus with diabetic polyneuropathy: Secondary | ICD-10-CM | POA: Diagnosis not present

## 2019-03-16 DIAGNOSIS — M170 Bilateral primary osteoarthritis of knee: Secondary | ICD-10-CM | POA: Diagnosis not present

## 2019-03-16 DIAGNOSIS — I5032 Chronic diastolic (congestive) heart failure: Secondary | ICD-10-CM | POA: Diagnosis not present

## 2019-03-16 DIAGNOSIS — Z48815 Encounter for surgical aftercare following surgery on the digestive system: Secondary | ICD-10-CM | POA: Diagnosis not present

## 2019-03-16 DIAGNOSIS — N183 Chronic kidney disease, stage 3 unspecified: Secondary | ICD-10-CM | POA: Diagnosis not present

## 2019-03-16 DIAGNOSIS — E1122 Type 2 diabetes mellitus with diabetic chronic kidney disease: Secondary | ICD-10-CM | POA: Diagnosis not present

## 2019-03-16 DIAGNOSIS — I130 Hypertensive heart and chronic kidney disease with heart failure and stage 1 through stage 4 chronic kidney disease, or unspecified chronic kidney disease: Secondary | ICD-10-CM | POA: Diagnosis not present

## 2019-03-16 DIAGNOSIS — E785 Hyperlipidemia, unspecified: Secondary | ICD-10-CM | POA: Diagnosis not present

## 2019-03-16 DIAGNOSIS — G709 Myoneural disorder, unspecified: Secondary | ICD-10-CM | POA: Diagnosis not present

## 2019-03-16 DIAGNOSIS — K5909 Other constipation: Secondary | ICD-10-CM | POA: Diagnosis not present

## 2019-03-20 DIAGNOSIS — N189 Chronic kidney disease, unspecified: Secondary | ICD-10-CM | POA: Diagnosis not present

## 2019-03-20 DIAGNOSIS — D631 Anemia in chronic kidney disease: Secondary | ICD-10-CM | POA: Diagnosis not present

## 2019-03-20 DIAGNOSIS — E876 Hypokalemia: Secondary | ICD-10-CM | POA: Diagnosis not present

## 2019-03-20 DIAGNOSIS — I5032 Chronic diastolic (congestive) heart failure: Secondary | ICD-10-CM | POA: Diagnosis not present

## 2019-03-20 DIAGNOSIS — N179 Acute kidney failure, unspecified: Secondary | ICD-10-CM | POA: Diagnosis not present

## 2019-03-20 DIAGNOSIS — I129 Hypertensive chronic kidney disease with stage 1 through stage 4 chronic kidney disease, or unspecified chronic kidney disease: Secondary | ICD-10-CM | POA: Diagnosis not present

## 2019-03-21 DIAGNOSIS — K5790 Diverticulosis of intestine, part unspecified, without perforation or abscess without bleeding: Secondary | ICD-10-CM | POA: Diagnosis not present

## 2019-03-21 DIAGNOSIS — M170 Bilateral primary osteoarthritis of knee: Secondary | ICD-10-CM | POA: Diagnosis not present

## 2019-03-21 DIAGNOSIS — K5909 Other constipation: Secondary | ICD-10-CM | POA: Diagnosis not present

## 2019-03-21 DIAGNOSIS — E1142 Type 2 diabetes mellitus with diabetic polyneuropathy: Secondary | ICD-10-CM | POA: Diagnosis not present

## 2019-03-21 DIAGNOSIS — J449 Chronic obstructive pulmonary disease, unspecified: Secondary | ICD-10-CM | POA: Diagnosis not present

## 2019-03-21 DIAGNOSIS — G709 Myoneural disorder, unspecified: Secondary | ICD-10-CM | POA: Diagnosis not present

## 2019-03-21 DIAGNOSIS — I480 Paroxysmal atrial fibrillation: Secondary | ICD-10-CM | POA: Diagnosis not present

## 2019-03-21 DIAGNOSIS — E1122 Type 2 diabetes mellitus with diabetic chronic kidney disease: Secondary | ICD-10-CM | POA: Diagnosis not present

## 2019-03-21 DIAGNOSIS — E785 Hyperlipidemia, unspecified: Secondary | ICD-10-CM | POA: Diagnosis not present

## 2019-03-21 DIAGNOSIS — G4733 Obstructive sleep apnea (adult) (pediatric): Secondary | ICD-10-CM | POA: Diagnosis not present

## 2019-03-21 DIAGNOSIS — D631 Anemia in chronic kidney disease: Secondary | ICD-10-CM | POA: Diagnosis not present

## 2019-03-21 DIAGNOSIS — I5032 Chronic diastolic (congestive) heart failure: Secondary | ICD-10-CM | POA: Diagnosis not present

## 2019-03-21 DIAGNOSIS — G894 Chronic pain syndrome: Secondary | ICD-10-CM | POA: Diagnosis not present

## 2019-03-21 DIAGNOSIS — D519 Vitamin B12 deficiency anemia, unspecified: Secondary | ICD-10-CM | POA: Diagnosis not present

## 2019-03-21 DIAGNOSIS — K219 Gastro-esophageal reflux disease without esophagitis: Secondary | ICD-10-CM | POA: Diagnosis not present

## 2019-03-21 DIAGNOSIS — I130 Hypertensive heart and chronic kidney disease with heart failure and stage 1 through stage 4 chronic kidney disease, or unspecified chronic kidney disease: Secondary | ICD-10-CM | POA: Diagnosis not present

## 2019-03-21 DIAGNOSIS — Z48815 Encounter for surgical aftercare following surgery on the digestive system: Secondary | ICD-10-CM | POA: Diagnosis not present

## 2019-03-21 DIAGNOSIS — N183 Chronic kidney disease, stage 3 unspecified: Secondary | ICD-10-CM | POA: Diagnosis not present

## 2019-03-21 DIAGNOSIS — K760 Fatty (change of) liver, not elsewhere classified: Secondary | ICD-10-CM | POA: Diagnosis not present

## 2019-03-22 DIAGNOSIS — N183 Chronic kidney disease, stage 3 unspecified: Secondary | ICD-10-CM | POA: Diagnosis not present

## 2019-03-22 DIAGNOSIS — K219 Gastro-esophageal reflux disease without esophagitis: Secondary | ICD-10-CM | POA: Diagnosis not present

## 2019-03-22 DIAGNOSIS — K5909 Other constipation: Secondary | ICD-10-CM | POA: Diagnosis not present

## 2019-03-22 DIAGNOSIS — E1122 Type 2 diabetes mellitus with diabetic chronic kidney disease: Secondary | ICD-10-CM | POA: Diagnosis not present

## 2019-03-22 DIAGNOSIS — E1142 Type 2 diabetes mellitus with diabetic polyneuropathy: Secondary | ICD-10-CM | POA: Diagnosis not present

## 2019-03-22 DIAGNOSIS — G894 Chronic pain syndrome: Secondary | ICD-10-CM | POA: Diagnosis not present

## 2019-03-22 DIAGNOSIS — K760 Fatty (change of) liver, not elsewhere classified: Secondary | ICD-10-CM | POA: Diagnosis not present

## 2019-03-22 DIAGNOSIS — D631 Anemia in chronic kidney disease: Secondary | ICD-10-CM | POA: Diagnosis not present

## 2019-03-22 DIAGNOSIS — E785 Hyperlipidemia, unspecified: Secondary | ICD-10-CM | POA: Diagnosis not present

## 2019-03-22 DIAGNOSIS — M170 Bilateral primary osteoarthritis of knee: Secondary | ICD-10-CM | POA: Diagnosis not present

## 2019-03-22 DIAGNOSIS — I130 Hypertensive heart and chronic kidney disease with heart failure and stage 1 through stage 4 chronic kidney disease, or unspecified chronic kidney disease: Secondary | ICD-10-CM | POA: Diagnosis not present

## 2019-03-22 DIAGNOSIS — Z48815 Encounter for surgical aftercare following surgery on the digestive system: Secondary | ICD-10-CM | POA: Diagnosis not present

## 2019-03-22 DIAGNOSIS — I5032 Chronic diastolic (congestive) heart failure: Secondary | ICD-10-CM | POA: Diagnosis not present

## 2019-03-22 DIAGNOSIS — J449 Chronic obstructive pulmonary disease, unspecified: Secondary | ICD-10-CM | POA: Diagnosis not present

## 2019-03-22 DIAGNOSIS — I480 Paroxysmal atrial fibrillation: Secondary | ICD-10-CM | POA: Diagnosis not present

## 2019-03-22 DIAGNOSIS — D519 Vitamin B12 deficiency anemia, unspecified: Secondary | ICD-10-CM | POA: Diagnosis not present

## 2019-03-22 DIAGNOSIS — G4733 Obstructive sleep apnea (adult) (pediatric): Secondary | ICD-10-CM | POA: Diagnosis not present

## 2019-03-22 DIAGNOSIS — K5790 Diverticulosis of intestine, part unspecified, without perforation or abscess without bleeding: Secondary | ICD-10-CM | POA: Diagnosis not present

## 2019-03-22 DIAGNOSIS — G709 Myoneural disorder, unspecified: Secondary | ICD-10-CM | POA: Diagnosis not present

## 2019-03-23 DIAGNOSIS — G629 Polyneuropathy, unspecified: Secondary | ICD-10-CM | POA: Diagnosis not present

## 2019-03-23 DIAGNOSIS — R531 Weakness: Secondary | ICD-10-CM | POA: Diagnosis not present

## 2019-03-24 DIAGNOSIS — K5909 Other constipation: Secondary | ICD-10-CM | POA: Diagnosis not present

## 2019-03-24 DIAGNOSIS — I480 Paroxysmal atrial fibrillation: Secondary | ICD-10-CM | POA: Diagnosis not present

## 2019-03-24 DIAGNOSIS — M170 Bilateral primary osteoarthritis of knee: Secondary | ICD-10-CM | POA: Diagnosis not present

## 2019-03-24 DIAGNOSIS — K760 Fatty (change of) liver, not elsewhere classified: Secondary | ICD-10-CM | POA: Diagnosis not present

## 2019-03-24 DIAGNOSIS — D631 Anemia in chronic kidney disease: Secondary | ICD-10-CM | POA: Diagnosis not present

## 2019-03-24 DIAGNOSIS — D519 Vitamin B12 deficiency anemia, unspecified: Secondary | ICD-10-CM | POA: Diagnosis not present

## 2019-03-24 DIAGNOSIS — E1142 Type 2 diabetes mellitus with diabetic polyneuropathy: Secondary | ICD-10-CM | POA: Diagnosis not present

## 2019-03-24 DIAGNOSIS — G709 Myoneural disorder, unspecified: Secondary | ICD-10-CM | POA: Diagnosis not present

## 2019-03-24 DIAGNOSIS — G4733 Obstructive sleep apnea (adult) (pediatric): Secondary | ICD-10-CM | POA: Diagnosis not present

## 2019-03-24 DIAGNOSIS — Z48815 Encounter for surgical aftercare following surgery on the digestive system: Secondary | ICD-10-CM | POA: Diagnosis not present

## 2019-03-24 DIAGNOSIS — E1122 Type 2 diabetes mellitus with diabetic chronic kidney disease: Secondary | ICD-10-CM | POA: Diagnosis not present

## 2019-03-24 DIAGNOSIS — J449 Chronic obstructive pulmonary disease, unspecified: Secondary | ICD-10-CM | POA: Diagnosis not present

## 2019-03-24 DIAGNOSIS — K219 Gastro-esophageal reflux disease without esophagitis: Secondary | ICD-10-CM | POA: Diagnosis not present

## 2019-03-24 DIAGNOSIS — I5032 Chronic diastolic (congestive) heart failure: Secondary | ICD-10-CM | POA: Diagnosis not present

## 2019-03-24 DIAGNOSIS — E785 Hyperlipidemia, unspecified: Secondary | ICD-10-CM | POA: Diagnosis not present

## 2019-03-24 DIAGNOSIS — I130 Hypertensive heart and chronic kidney disease with heart failure and stage 1 through stage 4 chronic kidney disease, or unspecified chronic kidney disease: Secondary | ICD-10-CM | POA: Diagnosis not present

## 2019-03-24 DIAGNOSIS — K5790 Diverticulosis of intestine, part unspecified, without perforation or abscess without bleeding: Secondary | ICD-10-CM | POA: Diagnosis not present

## 2019-03-24 DIAGNOSIS — G894 Chronic pain syndrome: Secondary | ICD-10-CM | POA: Diagnosis not present

## 2019-03-24 DIAGNOSIS — N183 Chronic kidney disease, stage 3 unspecified: Secondary | ICD-10-CM | POA: Diagnosis not present

## 2019-03-27 DIAGNOSIS — I130 Hypertensive heart and chronic kidney disease with heart failure and stage 1 through stage 4 chronic kidney disease, or unspecified chronic kidney disease: Secondary | ICD-10-CM | POA: Diagnosis not present

## 2019-03-27 DIAGNOSIS — G894 Chronic pain syndrome: Secondary | ICD-10-CM | POA: Diagnosis not present

## 2019-03-27 DIAGNOSIS — E1122 Type 2 diabetes mellitus with diabetic chronic kidney disease: Secondary | ICD-10-CM | POA: Diagnosis not present

## 2019-03-27 DIAGNOSIS — K219 Gastro-esophageal reflux disease without esophagitis: Secondary | ICD-10-CM | POA: Diagnosis not present

## 2019-03-27 DIAGNOSIS — E1142 Type 2 diabetes mellitus with diabetic polyneuropathy: Secondary | ICD-10-CM | POA: Diagnosis not present

## 2019-03-27 DIAGNOSIS — D631 Anemia in chronic kidney disease: Secondary | ICD-10-CM | POA: Diagnosis not present

## 2019-03-27 DIAGNOSIS — J449 Chronic obstructive pulmonary disease, unspecified: Secondary | ICD-10-CM | POA: Diagnosis not present

## 2019-03-27 DIAGNOSIS — Z48815 Encounter for surgical aftercare following surgery on the digestive system: Secondary | ICD-10-CM | POA: Diagnosis not present

## 2019-03-27 DIAGNOSIS — M170 Bilateral primary osteoarthritis of knee: Secondary | ICD-10-CM | POA: Diagnosis not present

## 2019-03-27 DIAGNOSIS — G4733 Obstructive sleep apnea (adult) (pediatric): Secondary | ICD-10-CM | POA: Diagnosis not present

## 2019-03-27 DIAGNOSIS — D519 Vitamin B12 deficiency anemia, unspecified: Secondary | ICD-10-CM | POA: Diagnosis not present

## 2019-03-27 DIAGNOSIS — K760 Fatty (change of) liver, not elsewhere classified: Secondary | ICD-10-CM | POA: Diagnosis not present

## 2019-03-27 DIAGNOSIS — N183 Chronic kidney disease, stage 3 unspecified: Secondary | ICD-10-CM | POA: Diagnosis not present

## 2019-03-27 DIAGNOSIS — K5909 Other constipation: Secondary | ICD-10-CM | POA: Diagnosis not present

## 2019-03-27 DIAGNOSIS — K5790 Diverticulosis of intestine, part unspecified, without perforation or abscess without bleeding: Secondary | ICD-10-CM | POA: Diagnosis not present

## 2019-03-27 DIAGNOSIS — G709 Myoneural disorder, unspecified: Secondary | ICD-10-CM | POA: Diagnosis not present

## 2019-03-27 DIAGNOSIS — I480 Paroxysmal atrial fibrillation: Secondary | ICD-10-CM | POA: Diagnosis not present

## 2019-03-27 DIAGNOSIS — I5032 Chronic diastolic (congestive) heart failure: Secondary | ICD-10-CM | POA: Diagnosis not present

## 2019-03-27 DIAGNOSIS — E785 Hyperlipidemia, unspecified: Secondary | ICD-10-CM | POA: Diagnosis not present

## 2019-03-29 DIAGNOSIS — M170 Bilateral primary osteoarthritis of knee: Secondary | ICD-10-CM | POA: Diagnosis not present

## 2019-03-29 DIAGNOSIS — I130 Hypertensive heart and chronic kidney disease with heart failure and stage 1 through stage 4 chronic kidney disease, or unspecified chronic kidney disease: Secondary | ICD-10-CM | POA: Diagnosis not present

## 2019-03-29 DIAGNOSIS — I480 Paroxysmal atrial fibrillation: Secondary | ICD-10-CM | POA: Diagnosis not present

## 2019-03-29 DIAGNOSIS — N183 Chronic kidney disease, stage 3 unspecified: Secondary | ICD-10-CM | POA: Diagnosis not present

## 2019-03-29 DIAGNOSIS — E1122 Type 2 diabetes mellitus with diabetic chronic kidney disease: Secondary | ICD-10-CM | POA: Diagnosis not present

## 2019-03-29 DIAGNOSIS — G4733 Obstructive sleep apnea (adult) (pediatric): Secondary | ICD-10-CM | POA: Diagnosis not present

## 2019-03-29 DIAGNOSIS — J449 Chronic obstructive pulmonary disease, unspecified: Secondary | ICD-10-CM | POA: Diagnosis not present

## 2019-03-29 DIAGNOSIS — K760 Fatty (change of) liver, not elsewhere classified: Secondary | ICD-10-CM | POA: Diagnosis not present

## 2019-03-29 DIAGNOSIS — D631 Anemia in chronic kidney disease: Secondary | ICD-10-CM | POA: Diagnosis not present

## 2019-03-29 DIAGNOSIS — E785 Hyperlipidemia, unspecified: Secondary | ICD-10-CM | POA: Diagnosis not present

## 2019-03-29 DIAGNOSIS — E1142 Type 2 diabetes mellitus with diabetic polyneuropathy: Secondary | ICD-10-CM | POA: Diagnosis not present

## 2019-03-29 DIAGNOSIS — K5790 Diverticulosis of intestine, part unspecified, without perforation or abscess without bleeding: Secondary | ICD-10-CM | POA: Diagnosis not present

## 2019-03-29 DIAGNOSIS — D519 Vitamin B12 deficiency anemia, unspecified: Secondary | ICD-10-CM | POA: Diagnosis not present

## 2019-03-29 DIAGNOSIS — K5909 Other constipation: Secondary | ICD-10-CM | POA: Diagnosis not present

## 2019-03-29 DIAGNOSIS — G894 Chronic pain syndrome: Secondary | ICD-10-CM | POA: Diagnosis not present

## 2019-03-29 DIAGNOSIS — I5032 Chronic diastolic (congestive) heart failure: Secondary | ICD-10-CM | POA: Diagnosis not present

## 2019-03-29 DIAGNOSIS — G709 Myoneural disorder, unspecified: Secondary | ICD-10-CM | POA: Diagnosis not present

## 2019-03-29 DIAGNOSIS — K219 Gastro-esophageal reflux disease without esophagitis: Secondary | ICD-10-CM | POA: Diagnosis not present

## 2019-03-29 DIAGNOSIS — Z48815 Encounter for surgical aftercare following surgery on the digestive system: Secondary | ICD-10-CM | POA: Diagnosis not present

## 2019-03-30 ENCOUNTER — Ambulatory Visit: Payer: Medicare Other | Admitting: Internal Medicine

## 2019-03-30 ENCOUNTER — Telehealth: Payer: Self-pay

## 2019-03-30 NOTE — Telephone Encounter (Signed)
No show letter mailed out to patient.

## 2019-03-31 ENCOUNTER — Ambulatory Visit: Payer: Medicare Other | Admitting: Internal Medicine

## 2019-03-31 ENCOUNTER — Encounter: Payer: Self-pay | Admitting: Internal Medicine

## 2019-03-31 ENCOUNTER — Other Ambulatory Visit: Payer: Self-pay

## 2019-03-31 VITALS — BP 110/68 | HR 60 | Temp 98.1°F | Ht 69.0 in | Wt 346.2 lb

## 2019-03-31 DIAGNOSIS — K838 Other specified diseases of biliary tract: Secondary | ICD-10-CM | POA: Diagnosis not present

## 2019-03-31 DIAGNOSIS — K9189 Other postprocedural complications and disorders of digestive system: Secondary | ICD-10-CM | POA: Diagnosis not present

## 2019-03-31 NOTE — Progress Notes (Signed)
Phillip Maldonado 69 y.o. [birth year] [id number]  Assessment & Plan:   Encounter Diagnosis  Name Primary?  . Bile leak, postoperative Yes    Arrange for stent removal w/ ERCP at hospital trying for next week The risks and benefits as well as alternatives of endoscopic procedure(s) have been discussed and reviewed. All questions answered. The patient agrees to proceed.     Subjective:   Chief Complaint: bile leak f/u needs stent removal  HPI Here w/ son He is s/p open cholecystectomy, sepsis and bile leak treated by plastic biliary stent in November 2020. Biliary drain was removed about 3 weeks ago. He feels well o/w. Unfortunately his wife is being admitted for depression after her daughter died from Covid - she has been very despondent since then. Allergies  Allergen Reactions  . Chlorzoxazone Other (See Comments)    Other reaction(s): Angioedema (ALLERGY/intolerance)  . Latex Other (See Comments) and Rash    Rash and itching Other reaction(s): Other (See Comments) Burning Feeling  . Piroxicam Other (See Comments) and Swelling    unknown Other reaction(s): Other (See Comments) unknown Tongue  . Adhesive [Tape] Other (See Comments)    Unknown paper tape ok to use    Current Meds  Medication Sig  . acetaminophen (TYLENOL) 500 MG tablet Take 2 tablets (1,000 mg total) by mouth every 8 (eight) hours.  Marland Kitchen amLODipine (NORVASC) 5 MG tablet Take 5 mg by mouth daily.  Marland Kitchen atorvastatin (LIPITOR) 40 MG tablet Take 1 tablet (40 mg total) by mouth daily.  . bisacodyl (DULCOLAX) 5 MG EC tablet Take 5 mg by mouth daily as needed for moderate constipation.  . cholecalciferol (VITAMIN D3) 25 MCG (1000 UT) tablet Take 1,000 Units by mouth daily.  . DULoxetine (CYMBALTA) 60 MG capsule Take 1 capsule (60 mg total) by mouth daily.  Marland Kitchen glucose blood (ACCU-CHEK AVIVA PLUS) test strip 1 each by Other route See admin instructions. Check blood sugar twice daily  . insulin glargine  (LANTUS) 100 UNIT/ML injection Inject 0.2 mLs (20 Units total) into the skin daily at 10 pm.  . metoprolol succinate (TOPROL-XL) 100 MG 24 hr tablet Take 100 mg by mouth daily.  . Multiple Vitamin (MULITIVITAMIN WITH MINERALS) TABS Take 1 tablet by mouth daily.  . pantoprazole (PROTONIX) 40 MG tablet TAKE 1 TABLET(40 MG) BY MOUTH TWICE DAILY (Patient taking differently: Take 40 mg by mouth 2 (two) times daily. TAKE 1 TABLET(40 MG) BY MOUTH TWICE DAILY)  . polycarbophil (FIBERCON) 625 MG tablet Take 625 mg by mouth as needed.  . torsemide (DEMADEX) 10 MG tablet Take 5 tablets (50 mg total) by mouth every Monday, Wednesday, and Friday. (Patient taking differently: Take 100 mg by mouth daily. )  . traZODone (DESYREL) 300 MG tablet Take 1 tablet (300 mg total) by mouth at bedtime.   Past Medical History:  Diagnosis Date  . Anemia   . Angina   . Anxiety   . Arthritis   . Atrial fibrillation with RVR ([place]) 01/24/2013  . Blood transfusion    last 10' 14- "GI bleed"  . CHF (congestive heart failure) ([place])   . CKD (chronic kidney disease) stage 3, GFR 30-59 ml/min 01/08/2016  . Depression   . Diabetes mellitus ([place]) 09/18/2011  . Diverticulosis   . DJD (degenerative joint disease)   . DM type 2, uncontrolled, with neuropathy ([place]) 12/26/2013  . Edema extremities    bilateral lower extremities-"weepy areas due to fluid retention"  . Fatty liver   .  Fundic gland polyps of stomach, benign   . Headache(784.0)   . Hepatitis B   . History of alcohol abuse    last drink in 1993  . Hypertension   . Neuromuscular disorder ([place])   . 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 Obesity   . Obesity, morbid ([place]) 07/30/2015  . Pneumonia    not at present time  . Renal insufficiency   . Shortness of breath   . Sleep apnea, obstructive    does where bipap. 06-04-14 "doesn't use much now, no mask/tubing now.   Past Surgical History:  Procedure Laterality Date  . ABDOMINAL SURGERY      [phone]  . BACK SURGERY     x 8 -,multiple fusions(cervical to lumbar)  . BILIARY STENT PLACEMENT  02/07/2019   Procedure: BILIARY STENT PLACEMENT;  Surgeon: Ladene Artist, MD;  Location: [facility] ENDOSCOPY;  Service: Endoscopy;;  . CHOLECYSTECTOMY N/A 02/02/2019   Procedure: attempted LAPAROSCOPIC CHOLECYSTECTOMY;  Surgeon: Coralie Keens, MD;  Location: [place];  Service: General;  Laterality: N/A;  . CHOLECYSTECTOMY  02/02/2019   Procedure: Cholecystectomy;  Surgeon: Coralie Keens, MD;  Location: [place];  Service: General;;  . COLONOSCOPY    . COLONOSCOPY WITH PROPOFOL N/A 06/14/2014   Procedure: COLONOSCOPY WITH PROPOFOL;  Surgeon: Gatha Mayer, MD;  Location: [facility] ENDOSCOPY;  Service: Endoscopy;  Laterality: N/A;  . DIAGNOSTIC LAPAROSCOPY    . ERCP N/A 02/07/2019   Procedure: ENDOSCOPIC RETROGRADE CHOLANGIOPANCREATOGRAPHY (ERCP);  Surgeon: Ladene Artist, MD;  Location: [facility] ENDOSCOPY;  Service: Endoscopy;  Laterality: N/A;  . ESOPHAGOGASTRODUODENOSCOPY N/A 01/03/2013   Procedure: ESOPHAGOGASTRODUODENOSCOPY (EGD);  Surgeon: Arta Silence, MD;  Location: [facility] ORS;  Service: Endoscopy;  Laterality: N/A;  . ESOPHAGOGASTRODUODENOSCOPY N/A 01/03/2013   Procedure: ESOPHAGOGASTRODUODENOSCOPY (EGD);  Surgeon: Arta Silence, MD;  Location: [facility] END[facility];  Service: Endoscopy;  Laterality: N/A;  . ESOPHAGOGASTRODUODENOSCOPY N/A 01/23/2013   Procedure: ESOPHAGOGASTRODUODENOSCOPY (EGD);  Surgeon: Lear Ng, MD;  Location[facility] ENDOSCOPY;  Service: Endoscopy;  Laterality: N/A;  . ESOPHAGOGASTRODUODENOSCOPY Left 01/27/2013   Procedure: ESOPHAGOGASTRODUODENOSCOPY (EGD);  Surgeon: Lear Ng, MD;  Location: [place];  Service: Endoscopy;  Laterality: Left;  . ESOPHAGOGASTRODUODENOSCOPY N/A 06/14/2014   Procedure: ESOPHAGOGASTRODUODENOSCOPY (EGD);  Surgeon: Gatha Mayer, MD;  Location: [facility] ENDOSCOPY;  Service: Endoscopy;  Laterality: N/A;  . ESOPHAGOSCOPY N/A 01/01/2013   Procedure:  ESOPHAGOSCOPY;  Surgeon: Jeryl Columbia, MD;  Location: [place];  Service: Endoscopy;  Laterality: N/A;  . GASTRIC BYPASS  1977   Reversed in 1992 and revision 1994  . HYDROCELE EXCISION  1996   x 2   . KNEE SURGERY Left 2004   Social History   Social History Narrative   Patient is divorced, he has a total of 6 children 2 of which are deceased. He is a retired Conservation officer, historic buildings. History of alcoholism. No alcohol now. 2 caffeinated beverages daily.    Married for 3rd time   family history includes Arthritis in his mother, sister, and sister; COPD in his father; Cancer in his maternal grandmother; Depression in his son and son; Diabetes in his daughter; Heart disease in his father, maternal uncle, maternal uncle, and paternal uncle; High blood pressure in his sister, sister, and son; Hypertension in his father; Osteoporosis in his mother; Skin cancer in his father; Stroke in his father.   Review of Systems As above Uses a walker  Objective:   Physical Exam BP 110/68   Pulse 60   Temp  98.1 F (36.7 C)   Ht 5\' 9"  (1.753 m)   Wt (!) 346 lb 4 oz (157.1 kg)   BMI 51.13 kg/m  Morbidly obese wm NAD Lungs CTA Cor S1s2

## 2019-03-31 NOTE — H&P (View-Only) (Signed)
Phillip Maldonado 69 y.o. Oct 22, 1950 [id number]  Assessment & Plan:   Encounter Diagnosis  Name Primary?  . Bile leak, postoperative Yes    Arrange for stent removal w/ ERCP at hospital trying for next week The risks and benefits as well as alternatives of endoscopic procedure(s) have been discussed and reviewed. All questions answered. The patient agrees to proceed.     Subjective:   Chief Complaint: bile leak f/u needs stent removal  HPI Here w/ son He is s/p open cholecystectomy, sepsis and bile leak treated by plastic biliary stent in November 2020. Biliary drain was removed about 3 weeks ago. He feels well o/w. Unfortunately his wife is being admitted for depression after her daughter died from Covid - she has been very despondent since then. Allergies  Allergen Reactions  . Chlorzoxazone Other (See Comments)    Other reaction(s): Angioedema (ALLERGY/intolerance)  . Latex Other (See Comments) and Rash    Rash and itching Other reaction(s): Other (See Comments) Burning Feeling  . Piroxicam Other (See Comments) and Swelling    unknown Other reaction(s): Other (See Comments) unknown Tongue  . Adhesive [Tape] Other (See Comments)    Unknown paper tape ok to use    Current Meds  Medication Sig  . acetaminophen (TYLENOL) 500 MG tablet Take 2 tablets (1,000 mg total) by mouth every 8 (eight) hours.  Marland Kitchen amLODipine (NORVASC) 5 MG tablet Take 5 mg by mouth daily.  Marland Kitchen atorvastatin (LIPITOR) 40 MG tablet Take 1 tablet (40 mg total) by mouth daily.  . bisacodyl (DULCOLAX) 5 MG EC tablet Take 5 mg by mouth daily as needed for moderate constipation.  . cholecalciferol (VITAMIN D3) 25 MCG (1000 UT) tablet Take 1,000 Units by mouth daily.  . DULoxetine (CYMBALTA) 60 MG capsule Take 1 capsule (60 mg total) by mouth daily.  Marland Kitchen glucose blood (ACCU-CHEK AVIVA PLUS) test strip 1 each by Other route See admin instructions. Check blood sugar twice daily  . insulin glargine  (LANTUS) 100 UNIT/ML injection Inject 0.2 mLs (20 Units total) into the skin daily at 10 pm.  . metoprolol succinate (TOPROL-XL) 100 MG 24 hr tablet Take 100 mg by mouth daily.  . Multiple Vitamin (MULITIVITAMIN WITH MINERALS) TABS Take 1 tablet by mouth daily.  . pantoprazole (PROTONIX) 40 MG tablet TAKE 1 TABLET(40 MG) BY MOUTH TWICE DAILY (Patient taking differently: Take 40 mg by mouth 2 (two) times daily. TAKE 1 TABLET(40 MG) BY MOUTH TWICE DAILY)  . polycarbophil (FIBERCON) 625 MG tablet Take 625 mg by mouth as needed.  . torsemide (DEMADEX) 10 MG tablet Take 5 tablets (50 mg total) by mouth every Monday, Wednesday, and Friday. (Patient taking differently: Take 100 mg by mouth daily. )  . traZODone (DESYREL) 300 MG tablet Take 1 tablet (300 mg total) by mouth at bedtime.   Past Medical History:  Diagnosis Date  . Anemia   . Angina   . Anxiety   . Arthritis   . Atrial fibrillation with RVR ([place]) 01/24/2013  . Blood transfusion    last 10' 14- "GI bleed"  . CHF (congestive heart failure) ([place])   . CKD (chronic kidney disease) stage 3, GFR 30-59 ml/min 01/08/2016  . Depression   . Diabetes mellitus ([place]) 09/18/2011  . Diverticulosis   . DJD (degenerative joint disease)   . DM type 2, uncontrolled, with neuropathy ([place]) 12/26/2013  . Edema extremities    bilateral lower extremities-"weepy areas due to fluid retention"  . Fatty liver   .  Fundic gland polyps of stomach, benign   . Headache(784.0)   . Hepatitis B   . History of alcohol abuse    last drink in 1993  . Hypertension   . Neuromuscular disorder ([place])   . 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 Obesity   . Obesity, morbid ([place]) 07/30/2015  . Pneumonia    not at present time  . Renal insufficiency   . Shortness of breath   . Sleep apnea, obstructive    does where bipap. 06-04-14 "doesn't use much now, no mask/tubing now.   Past Surgical History:  Procedure Laterality Date  . ABDOMINAL SURGERY      [phone]  . BACK SURGERY     x 8 -,multiple fusions(cervical to lumbar)  . BILIARY STENT PLACEMENT  02/07/2019   Procedure: BILIARY STENT PLACEMENT;  Surgeon: Ladene Artist, MD;  Location: [facility] ENDOSCOPY;  Service: Endoscopy;;  . CHOLECYSTECTOMY N/A 02/02/2019   Procedure: attempted LAPAROSCOPIC CHOLECYSTECTOMY;  Surgeon: Coralie Keens, MD;  Location: [place];  Service: General;  Laterality: N/A;  . CHOLECYSTECTOMY  02/02/2019   Procedure: Cholecystectomy;  Surgeon: Coralie Keens, MD;  Location: [place];  Service: General;;  . COLONOSCOPY    . COLONOSCOPY WITH PROPOFOL N/A 06/14/2014   Procedure: COLONOSCOPY WITH PROPOFOL;  Surgeon: Gatha Mayer, MD;  [facility] ENDOSCOPY;  Service: Endoscopy;  Laterality: N/A;  . DIAGNOSTIC LAPAROSCOPY    . ERCP N/A 02/07/2019   Procedure: ENDOSCOPIC RETROGRADE CHOLANGIOPANCREATOGRAPHY (ERCP);  Surgeon: Ladene Artist, MD;  Location[facility] ENDOSCOPY;  Service: Endoscopy;  Laterality: N/A;  . ESOPHAGOGASTRODUODENOSCOPY N/A 01/03/2013   Procedure: ESOPHAGOGASTRODUODENOSCOPY (EGD);  Surgeon: Arta Silence, MD;  Location: [facility];  Service: Endoscopy;  Laterality: N/A;  . ESOPHAGOGASTRODUODENOSCOPY N/A 01/03/2013   Procedure: ESOPHAGOGASTRODUODENOSCOPY (EGD);  Surgeon: Arta Silence, MD;  Location: [facility];  Service: Endoscopy;  Laterality: N/A;  . ESOPHAGOGASTRODUODENOSCOPY N/A 01/23/2013   Procedure: ESOPHAGOGASTRODUODENOSCOPY (EGD);  Surgeon: Lear Ng, MD;  Location[facility] ENDOSCOPY;  Service: Endoscopy;  Laterality: N/A;  . ESOPHAGOGASTRODUODENOSCOPY Left 01/27/2013   Procedure: ESOPHAGOGASTRODUODENOSCOPY (EGD);  Surgeon: Lear Ng, MD;  Location: [place];  Service: Endoscopy;  Laterality: Left;  . ESOPHAGOGASTRODUODENOSCOPY N/A 06/14/2014   Procedure: ESOPHAGOGASTRODUODENOSCOPY (EGD);  Surgeon: Gatha Mayer, MD;  Location: [facility];  Service: Endoscopy;  Laterality: N/A;  . ESOPHAGOSCOPY N/A 01/01/2013   Procedure:  ESOPHAGOSCOPY;  Surgeon: Jeryl Columbia, MD;  Location: [place];  Service: Endoscopy;  Laterality: N/A;  . GASTRIC BYPASS  1977   Reversed in 1992 and revision 1994  . HYDROCELE EXCISION  1996   x 2   . KNEE SURGERY Left 2004   Social History   Social History Narrative   Patient is divorced, he has a total of 6 children 2 of which are deceased. He is a retired Conservation officer, historic buildings. History of alcoholism. No alcohol now. 2 caffeinated beverages daily.    Married for 3rd time   family history includes Arthritis in his mother, sister, and sister; COPD in his father; Cancer in his maternal grandmother; Depression in his son and son; Diabetes in his daughter; Heart disease in his father, maternal uncle, maternal uncle, and paternal uncle; High blood pressure in his sister, sister, and son; Hypertension in his father; Osteoporosis in his mother; Skin cancer in his father; Stroke in his father.   Review of Systems As above Uses a walker  Objective:   Physical Exam BP 110/68   Pulse 60   Temp  98.1 F (36.7 C)   Ht 5\' 9"  (1.753 m)   Wt (!) 346 lb 4 oz (157.1 kg)   BMI 51.13 kg/m  Morbidly obese wm NAD Lungs CTA Cor S1s2

## 2019-03-31 NOTE — Patient Instructions (Signed)
You have been scheduled for an ERCP. Please follow written instructions given to you at your visit today. If you use inhalers (even only as needed), please bring them with you on the day of your procedure.    I appreciate the opportunity to care for you. [name], [facility]

## 2019-04-03 DIAGNOSIS — I480 Paroxysmal atrial fibrillation: Secondary | ICD-10-CM | POA: Diagnosis not present

## 2019-04-03 DIAGNOSIS — K219 Gastro-esophageal reflux disease without esophagitis: Secondary | ICD-10-CM | POA: Diagnosis not present

## 2019-04-03 DIAGNOSIS — G4733 Obstructive sleep apnea (adult) (pediatric): Secondary | ICD-10-CM | POA: Diagnosis not present

## 2019-04-03 DIAGNOSIS — D631 Anemia in chronic kidney disease: Secondary | ICD-10-CM | POA: Diagnosis not present

## 2019-04-03 DIAGNOSIS — K5909 Other constipation: Secondary | ICD-10-CM | POA: Diagnosis not present

## 2019-04-03 DIAGNOSIS — E785 Hyperlipidemia, unspecified: Secondary | ICD-10-CM | POA: Diagnosis not present

## 2019-04-03 DIAGNOSIS — K5790 Diverticulosis of intestine, part unspecified, without perforation or abscess without bleeding: Secondary | ICD-10-CM | POA: Diagnosis not present

## 2019-04-03 DIAGNOSIS — E1142 Type 2 diabetes mellitus with diabetic polyneuropathy: Secondary | ICD-10-CM | POA: Diagnosis not present

## 2019-04-03 DIAGNOSIS — K760 Fatty (change of) liver, not elsewhere classified: Secondary | ICD-10-CM | POA: Diagnosis not present

## 2019-04-03 DIAGNOSIS — Z48815 Encounter for surgical aftercare following surgery on the digestive system: Secondary | ICD-10-CM | POA: Diagnosis not present

## 2019-04-03 DIAGNOSIS — N183 Chronic kidney disease, stage 3 unspecified: Secondary | ICD-10-CM | POA: Diagnosis not present

## 2019-04-03 DIAGNOSIS — I5032 Chronic diastolic (congestive) heart failure: Secondary | ICD-10-CM | POA: Diagnosis not present

## 2019-04-03 DIAGNOSIS — D519 Vitamin B12 deficiency anemia, unspecified: Secondary | ICD-10-CM | POA: Diagnosis not present

## 2019-04-03 DIAGNOSIS — G709 Myoneural disorder, unspecified: Secondary | ICD-10-CM | POA: Diagnosis not present

## 2019-04-03 DIAGNOSIS — J449 Chronic obstructive pulmonary disease, unspecified: Secondary | ICD-10-CM | POA: Diagnosis not present

## 2019-04-03 DIAGNOSIS — E1122 Type 2 diabetes mellitus with diabetic chronic kidney disease: Secondary | ICD-10-CM | POA: Diagnosis not present

## 2019-04-03 DIAGNOSIS — M170 Bilateral primary osteoarthritis of knee: Secondary | ICD-10-CM | POA: Diagnosis not present

## 2019-04-03 DIAGNOSIS — I130 Hypertensive heart and chronic kidney disease with heart failure and stage 1 through stage 4 chronic kidney disease, or unspecified chronic kidney disease: Secondary | ICD-10-CM | POA: Diagnosis not present

## 2019-04-03 DIAGNOSIS — G894 Chronic pain syndrome: Secondary | ICD-10-CM | POA: Diagnosis not present

## 2019-04-03 NOTE — Progress Notes (Signed)
Pre-op endo call completed  

## 2019-04-04 ENCOUNTER — Inpatient Hospital Stay (HOSPITAL_COMMUNITY): Admission: RE | Admit: 2019-04-04 | Payer: Medicare Other | Source: Ambulatory Visit

## 2019-04-05 ENCOUNTER — Other Ambulatory Visit (HOSPITAL_COMMUNITY)
Admission: RE | Admit: 2019-04-05 | Discharge: 2019-04-05 | Disposition: A | Discharge status: Home / Self Care | Payer: Medicare Other | Source: Ambulatory Visit | Attending: Internal Medicine | Admitting: Internal Medicine

## 2019-04-05 DIAGNOSIS — Z01812 Encounter for preprocedural laboratory examination: Secondary | ICD-10-CM | POA: Insufficient documentation

## 2019-04-05 DIAGNOSIS — Z20822 Contact with and (suspected) exposure to covid-19: Secondary | ICD-10-CM | POA: Insufficient documentation

## 2019-04-05 LAB — SARS CORONAVIRUS 2 (TAT 6-24 HRS): SARS Coronavirus 2: NEGATIVE

## 2019-04-06 DIAGNOSIS — K5909 Other constipation: Secondary | ICD-10-CM | POA: Diagnosis not present

## 2019-04-06 DIAGNOSIS — K760 Fatty (change of) liver, not elsewhere classified: Secondary | ICD-10-CM | POA: Diagnosis not present

## 2019-04-06 DIAGNOSIS — M170 Bilateral primary osteoarthritis of knee: Secondary | ICD-10-CM | POA: Diagnosis not present

## 2019-04-06 DIAGNOSIS — D519 Vitamin B12 deficiency anemia, unspecified: Secondary | ICD-10-CM | POA: Diagnosis not present

## 2019-04-06 DIAGNOSIS — J449 Chronic obstructive pulmonary disease, unspecified: Secondary | ICD-10-CM | POA: Diagnosis not present

## 2019-04-06 DIAGNOSIS — D631 Anemia in chronic kidney disease: Secondary | ICD-10-CM | POA: Diagnosis not present

## 2019-04-06 DIAGNOSIS — N183 Chronic kidney disease, stage 3 unspecified: Secondary | ICD-10-CM | POA: Diagnosis not present

## 2019-04-06 DIAGNOSIS — E1142 Type 2 diabetes mellitus with diabetic polyneuropathy: Secondary | ICD-10-CM | POA: Diagnosis not present

## 2019-04-06 DIAGNOSIS — K219 Gastro-esophageal reflux disease without esophagitis: Secondary | ICD-10-CM | POA: Diagnosis not present

## 2019-04-06 DIAGNOSIS — E785 Hyperlipidemia, unspecified: Secondary | ICD-10-CM | POA: Diagnosis not present

## 2019-04-06 DIAGNOSIS — G709 Myoneural disorder, unspecified: Secondary | ICD-10-CM | POA: Diagnosis not present

## 2019-04-06 DIAGNOSIS — I130 Hypertensive heart and chronic kidney disease with heart failure and stage 1 through stage 4 chronic kidney disease, or unspecified chronic kidney disease: Secondary | ICD-10-CM | POA: Diagnosis not present

## 2019-04-06 DIAGNOSIS — K5790 Diverticulosis of intestine, part unspecified, without perforation or abscess without bleeding: Secondary | ICD-10-CM | POA: Diagnosis not present

## 2019-04-06 DIAGNOSIS — G4733 Obstructive sleep apnea (adult) (pediatric): Secondary | ICD-10-CM | POA: Diagnosis not present

## 2019-04-06 DIAGNOSIS — I480 Paroxysmal atrial fibrillation: Secondary | ICD-10-CM | POA: Diagnosis not present

## 2019-04-06 DIAGNOSIS — I5032 Chronic diastolic (congestive) heart failure: Secondary | ICD-10-CM | POA: Diagnosis not present

## 2019-04-06 DIAGNOSIS — E1122 Type 2 diabetes mellitus with diabetic chronic kidney disease: Secondary | ICD-10-CM | POA: Diagnosis not present

## 2019-04-06 DIAGNOSIS — G894 Chronic pain syndrome: Secondary | ICD-10-CM | POA: Diagnosis not present

## 2019-04-06 DIAGNOSIS — Z48815 Encounter for surgical aftercare following surgery on the digestive system: Secondary | ICD-10-CM | POA: Diagnosis not present

## 2019-04-07 ENCOUNTER — Other Ambulatory Visit: Payer: Self-pay

## 2019-04-07 ENCOUNTER — Ambulatory Visit (HOSPITAL_COMMUNITY)
Admission: RE | Admit: 2019-04-07 | Discharge: 2019-04-07 | Disposition: A | Discharge status: Home / Self Care | Payer: Medicare Other | Attending: Internal Medicine | Admitting: Internal Medicine

## 2019-04-07 ENCOUNTER — Encounter (HOSPITAL_COMMUNITY)
Admission: RE | Disposition: A | Discharge status: Home / Self Care | Payer: Self-pay | Source: Home / Self Care | Attending: Internal Medicine

## 2019-04-07 ENCOUNTER — Ambulatory Visit (HOSPITAL_COMMUNITY): Payer: Medicare Other | Admitting: Certified Registered Nurse Anesthetist

## 2019-04-07 ENCOUNTER — Ambulatory Visit (HOSPITAL_COMMUNITY): Payer: Medicare Other

## 2019-04-07 ENCOUNTER — Encounter (HOSPITAL_COMMUNITY): Payer: Self-pay | Admitting: Internal Medicine

## 2019-04-07 DIAGNOSIS — E1151 Type 2 diabetes mellitus with diabetic peripheral angiopathy without gangrene: Secondary | ICD-10-CM | POA: Insufficient documentation

## 2019-04-07 DIAGNOSIS — N183 Chronic kidney disease, stage 3 unspecified: Secondary | ICD-10-CM | POA: Diagnosis not present

## 2019-04-07 DIAGNOSIS — E785 Hyperlipidemia, unspecified: Secondary | ICD-10-CM | POA: Insufficient documentation

## 2019-04-07 DIAGNOSIS — I130 Hypertensive heart and chronic kidney disease with heart failure and stage 1 through stage 4 chronic kidney disease, or unspecified chronic kidney disease: Secondary | ICD-10-CM | POA: Diagnosis not present

## 2019-04-07 DIAGNOSIS — R109 Unspecified abdominal pain: Secondary | ICD-10-CM

## 2019-04-07 DIAGNOSIS — K317 Polyp of stomach and duodenum: Secondary | ICD-10-CM | POA: Diagnosis not present

## 2019-04-07 DIAGNOSIS — G4733 Obstructive sleep apnea (adult) (pediatric): Secondary | ICD-10-CM | POA: Diagnosis not present

## 2019-04-07 DIAGNOSIS — Z9884 Bariatric surgery status: Secondary | ICD-10-CM | POA: Diagnosis not present

## 2019-04-07 DIAGNOSIS — K9189 Other postprocedural complications and disorders of digestive system: Secondary | ICD-10-CM

## 2019-04-07 DIAGNOSIS — F419 Anxiety disorder, unspecified: Secondary | ICD-10-CM | POA: Diagnosis not present

## 2019-04-07 DIAGNOSIS — E1122 Type 2 diabetes mellitus with diabetic chronic kidney disease: Secondary | ICD-10-CM | POA: Insufficient documentation

## 2019-04-07 DIAGNOSIS — F329 Major depressive disorder, single episode, unspecified: Secondary | ICD-10-CM | POA: Insufficient documentation

## 2019-04-07 DIAGNOSIS — I509 Heart failure, unspecified: Secondary | ICD-10-CM | POA: Insufficient documentation

## 2019-04-07 DIAGNOSIS — Z79899 Other long term (current) drug therapy: Secondary | ICD-10-CM | POA: Diagnosis not present

## 2019-04-07 DIAGNOSIS — Z794 Long term (current) use of insulin: Secondary | ICD-10-CM | POA: Diagnosis not present

## 2019-04-07 DIAGNOSIS — Z6841 Body Mass Index (BMI) 40.0 and over, adult: Secondary | ICD-10-CM | POA: Diagnosis not present

## 2019-04-07 DIAGNOSIS — Z8719 Personal history of other diseases of the digestive system: Secondary | ICD-10-CM | POA: Diagnosis not present

## 2019-04-07 DIAGNOSIS — Y658 Other specified misadventures during surgical and medical care: Secondary | ICD-10-CM | POA: Insufficient documentation

## 2019-04-07 DIAGNOSIS — Z4659 Encounter for fitting and adjustment of other gastrointestinal appliance and device: Secondary | ICD-10-CM | POA: Diagnosis not present

## 2019-04-07 DIAGNOSIS — K9171 Accidental puncture and laceration of a digestive system organ or structure during a digestive system procedure: Secondary | ICD-10-CM | POA: Insufficient documentation

## 2019-04-07 DIAGNOSIS — I4891 Unspecified atrial fibrillation: Secondary | ICD-10-CM | POA: Insufficient documentation

## 2019-04-07 DIAGNOSIS — K838 Other specified diseases of biliary tract: Secondary | ICD-10-CM

## 2019-04-07 HISTORY — PX: ERCP: SHX5425

## 2019-04-07 HISTORY — PX: GASTROINTESTINAL STENT REMOVAL: SHX6384

## 2019-04-07 LAB — GLUCOSE, CAPILLARY: Glucose-Capillary: 140 mg/dL — ABNORMAL HIGH (ref 70–99)

## 2019-04-07 SURGERY — ERCP, WITH INTERVENTION IF INDICATED
Anesthesia: General

## 2019-04-07 MED ORDER — SUGAMMADEX SODIUM 500 MG/5ML IV SOLN
INTRAVENOUS | Status: DC | PRN
  Administered 2019-04-07: 500 mg via INTRAVENOUS

## 2019-04-07 MED ORDER — ROCURONIUM BROMIDE 10 MG/ML (PF) SYRINGE
PREFILLED_SYRINGE | INTRAVENOUS | Status: DC | PRN
  Administered 2019-04-07: 70 mg via INTRAVENOUS

## 2019-04-07 MED ORDER — SODIUM CHLORIDE 0.9 % IV SOLN: INTRAVENOUS | Status: DC

## 2019-04-07 MED ORDER — INDOMETHACIN 50 MG RE SUPP
RECTAL | Status: AC
  Filled 2019-04-07: qty 1

## 2019-04-07 MED ORDER — DEXAMETHASONE SODIUM PHOSPHATE 10 MG/ML IJ SOLN
INTRAMUSCULAR | Status: DC | PRN
  Administered 2019-04-07: 10 mg via INTRAVENOUS

## 2019-04-07 MED ORDER — FENTANYL CITRATE (PF) 100 MCG/2ML IJ SOLN
INTRAMUSCULAR | Status: AC
  Filled 2019-04-07: qty 2

## 2019-04-07 MED ORDER — MIDAZOLAM HCL 2 MG/2ML IJ SOLN
INTRAMUSCULAR | Status: AC
  Filled 2019-04-07: qty 2

## 2019-04-07 MED ORDER — ONDANSETRON HCL 4 MG/2ML IJ SOLN
INTRAMUSCULAR | Status: DC | PRN
  Administered 2019-04-07: 4 mg via INTRAVENOUS

## 2019-04-07 MED ORDER — INDOMETHACIN 50 MG RE SUPP
RECTAL | Status: DC | PRN
  Administered 2019-04-07: 100 mg via RECTAL

## 2019-04-07 MED ORDER — LIDOCAINE 2% (20 MG/ML) 5 ML SYRINGE
INTRAMUSCULAR | Status: DC | PRN
  Administered 2019-04-07: 60 mg via INTRAVENOUS

## 2019-04-07 MED ORDER — FENTANYL CITRATE (PF) 100 MCG/2ML IJ SOLN
INTRAMUSCULAR | Status: DC | PRN
  Administered 2019-04-07: 100 ug via INTRAVENOUS

## 2019-04-07 MED ORDER — PANTOPRAZOLE SODIUM 40 MG PO TBEC: 40.0000 mg | DELAYED_RELEASE_TABLET | Freq: Every day | ORAL | 3 refills | Status: DC

## 2019-04-07 MED ORDER — LACTATED RINGERS IV SOLN
INTRAVENOUS | Status: DC
  Administered 2019-04-07: 1000 mL via INTRAVENOUS

## 2019-04-07 MED ORDER — SODIUM CHLORIDE 0.9 % IV SOLN
INTRAVENOUS | Status: DC | PRN
  Administered 2019-04-07: 30 mL

## 2019-04-07 MED ORDER — MIDAZOLAM HCL 5 MG/5ML IJ SOLN
INTRAMUSCULAR | Status: DC | PRN
  Administered 2019-04-07: 2 mg via INTRAVENOUS

## 2019-04-07 MED ORDER — GLUCAGON HCL RDNA (DIAGNOSTIC) 1 MG IJ SOLR
INTRAMUSCULAR | Status: AC
  Filled 2019-04-07: qty 1

## 2019-04-07 NOTE — Discharge Instructions (Signed)
I removed the stent - no leak.  In the procedure I did irritate the lining of the stomach pouch slightly but that should heal up I think.  I appreciate the opportunity to care for you. Gatha Mayer, MD, FACG   YOU HAD AN ENDOSCOPIC PROCEDURE TODAY: Refer to the procedure report and other information in the discharge instructions given to you for any specific questions about what was found during the examination. If this information does not answer your questions, please call Dr. Celesta Aver office at [phone] to clarify.   YOU SHOULD EXPECT: Some feelings of bloating in the abdomen. Passage of more gas than usual. Walking can help get rid of the air that was put into your GI tract during the procedure and reduce the bloating. If you had a lower endoscopy (such as a colonoscopy or flexible sigmoidoscopy) you may notice spotting of blood in your stool or on the toilet paper. Some abdominal soreness may be present for a day or two, also.  DIET:   Liquids only today and nothing creamy or greasy/fatty.  Normal diet tomorrow.   ACTIVITY: Your care partner should take you home directly after the procedure. You should plan to take it easy, moving slowly for the rest of the day. You can resume normal activity the day after the procedure however YOU SHOULD NOT DRIVE, use power tools, machinery or perform tasks that involve climbing or major physical exertion for 24 hours (because of the sedation medicines used during the test).   SYMPTOMS TO REPORT IMMEDIATELY: A gastroenterologist can be reached at any hour. Please call [phone]  for any of the following symptoms:   Following upper endoscopy (EGD, EUS, ERCP, esophageal dilation) Vomiting of blood or coffee ground material  New, significant abdominal pain  New, significant chest pain or pain under the shoulder blades  Painful or persistently difficult swallowing  New shortness of breath  Black, tarry-looking or red, bloody stools

## 2019-04-07 NOTE — Anesthesia Postprocedure Evaluation (Signed)
Anesthesia Post Note  Patient: Phillip Maldonado  Procedure(s) Performed: ENDOSCOPIC RETROGRADE CHOLANGIOPANCREATOGRAPHY (ERCP) (N/A ) GASTROINTESTINAL STENT REMOVAL (N/A )     Patient location during evaluation: Phase II Anesthesia Type: General Level of consciousness: awake and sedated Pain management: pain level controlled Vital Signs Assessment: post-procedure vital signs reviewed and stable Respiratory status: spontaneous breathing Cardiovascular status: stable Postop Assessment: no apparent nausea or vomiting Anesthetic complications: no    Last Vitals:  Vitals:   04/07/19 1310 04/07/19 1320  BP: (!) 186/81 (!) 188/81  Pulse: 76 74  Resp: 19 (!) 21  Temp:    SpO2: 92% 93%    Last Pain:  Vitals:   04/07/19 1300  TempSrc:   PainSc: 0-No pain   Pain Goal:                   Huston Foley

## 2019-04-07 NOTE — Anesthesia Procedure Notes (Signed)
Procedure Name: Intubation Date/Time: 04/07/2019 12:14 PM Performed by: Gerald Leitz, CRNA Pre-anesthesia Checklist: Patient identified, Patient being monitored, Timeout performed, Emergency Drugs available and Suction available Patient Re-evaluated:Patient Re-evaluated prior to induction Oxygen Delivery Method: Circle system utilized Preoxygenation: Pre-oxygenation with 100% oxygen Induction Type: IV induction Ventilation: Mask ventilation without difficulty Laryngoscope Size: Mac and 3 Grade View: Grade I Tube type: Oral Tube size: 7.5 mm Number of attempts: 1 Placement Confirmation: ETT inserted through vocal cords under direct vision,  positive ETCO2 and breath sounds checked- equal and bilateral Secured at: 23 cm Tube secured with: Tape Dental Injury: Teeth and Oropharynx as per pre-operative assessment

## 2019-04-07 NOTE — Anesthesia Preprocedure Evaluation (Signed)
Anesthesia Evaluation  Patient identified by MRN, date of birth, ID band Patient awake    Reviewed: Allergy & Precautions, NPO status , Patient's Chart, lab work & pertinent test results  Airway Mallampati: III  TM Distance: >3 FB Neck ROM: Full    Dental  (+) Missing, Poor Dentition   Pulmonary sleep apnea ,    Pulmonary exam normal breath sounds clear to auscultation       Cardiovascular hypertension, Pt. on medications and Pt. on home beta blockers + Peripheral Vascular Disease and +CHF  Normal cardiovascular exam+ dysrhythmias Atrial Fibrillation  Rhythm:Regular Rate:Normal  ECG: rate 84.  Normal sinus rhythm Left axis deviation Right bundle branch block Left ventricular hypertrophy  ECHO: 1. Left ventricular ejection fraction, by visual estimation, is 60 to 65%. The left ventricle has normal function. There is no left ventricular hypertrophy. 2. Left ventricular diastolic parameters are consistent with Grade I diastolic dysfunction (impaired relaxation). 3. Global right ventricle has normal systolic function.The right ventricular size is normal. No increase in right ventricular wall thickness. 4. Left atrial size was mildly dilated. 5. Right atrial size was normal. 6. Moderate mitral annular calcification. 7. The mitral valve is normal in structure. No evidence of mitral valve regurgitation. No evidence of mitral stenosis. 8. The tricuspid valve is normal in structure. Tricuspid valve regurgitation is not demonstrated. 9. The aortic valve is tricuspid. Aortic valve regurgitation is not visualized. Mild aortic valve sclerosis without stenosis. 10. The pulmonic valve was normal in structure. Pulmonic valve regurgitation is not visualized. 11. The inferior vena cava is normal in size with greater than 50% respiratory variability, suggesting right atrial pressure of 3 mmHg. 12. The interatrial septum was not well  visualized.   Neuro/Psych  Headaches, PSYCHIATRIC DISORDERS Anxiety Depression  Neuromuscular disease    GI/Hepatic negative GI ROS, (+) Hepatitis -  Endo/Other  diabetes, Poorly Controlled, Type 2, Oral Hypoglycemic Agents, Insulin DependentMorbid obesity  Renal/GU CRFRenal disease         Musculoskeletal  (+) Arthritis ,   Abdominal (+) + obese,   Peds  Hematology HLD   Anesthesia Other Findings Study Result  Result status: Final result   ECHOCARDIOGRAM REPORT       Patient Name:   Phillip Maldonado Date of Exam: 01/22/2019 Medical Rec #:  [id number]          Height:       69.0 in Accession #:    [id number]         Weight:       354.9 lb Date of Birth:  08/16/1950          BSA:          2.64 m Patient Age:    69 years           BP:           127/68 mmHg Patient Gender: M                  HR:           95 bpm. Exam Location:  Inpatient  Procedure: 2D Echo, Cardiac Doppler, Color Doppler and Intracardiac            Opacification Agent  Indications:    Dyspnea 786.09   History:        Patient has prior history of Echocardiogram examinations, most                 recent 01/25/2013. Arrythmias:Atrial Fibrillation  Risk                 Factors:Hypertension, Diabetes, Dyslipidemia and Non-Smoker.                 PVD.   Sonographer:    Paulita Fujita RDCS Referring Phys: [id number] [facility] LATIF [place]    1. Left ventricular ejection fraction, by visual estimation, is 60 to 65%. The left ventricle has normal function. There is no left ventricular hypertrophy.  2. Left ventricular diastolic parameters are consistent with Grade I diastolic dysfunction (impaired relaxation).  3. Global right ventricle has normal systolic function.The right ventricular size is normal. No increase in right ventricular wall thickness.  4. Left atrial size was mildly dilated.  5. Right atrial size was normal.  6. Moderate mitral annular calcification.  7. The mitral valve is  normal in structure. No evidence of mitral valve regurgitation. No evidence of mitral stenosis.  8. The tricuspid valve is normal in structure. Tricuspid valve regurgitation is not demonstrated.  9. The aortic valve is tricuspid. Aortic valve regurgitation is not visualized. Mild aortic valve sclerosis without stenosis. 10. The pulmonic valve was normal in structure. Pulmonic valve regurgitation is not visualized. 11. The inferior vena cava is normal in size with greater than 50% respiratory variability, suggesting right atrial pressure of 3 mmHg. 12. The interatrial septum was not well visualized.  FINDINGS  Left Ventricle: Left ventricular ejection fraction, by visual estimation, is 60 to 65%. The left ventricle has normal function. There is no left ventricular hypertrophy. Left ventricular diastolic parameters are consistent with Grade I diastolic  dysfunction (impaired relaxation). Normal left atrial pressure.  Right Ventricle: The right ventricular size is normal. No increase in right ventricular wall thickness. Global RV systolic function is has normal systolic function.  Left Atrium: Left atrial size was mildly dilated.  Right Atrium: Right atrial size was normal in size  Pericardium: There is no evidence of pericardial effusion.  Mitral Valve: The mitral valve is normal in structure. There is mild thickening of the mitral valve leaflet(s). There is mild calcification of the mitral valve leaflet(s). Moderate mitral annular calcification. No evidence of mitral valve stenosis by  observation. No evidence of mitral valve regurgitation.  Tricuspid Valve: The tricuspid valve is normal in structure. Tricuspid valve regurgitation is not demonstrated.  Aortic Valve: The aortic valve is tricuspid. Aortic valve regurgitation is not visualized. Mild aortic valve sclerosis is present, with no evidence of aortic valve stenosis.  Pulmonic Valve: The pulmonic valve was normal in structure.  Pulmonic valve regurgitation is not visualized.  Aorta: The aortic root, ascending aorta and aortic arch are all structurally normal, with no evidence of dilitation or obstruction.  Venous: The inferior vena cava is normal in size with greater than 50% respiratory variability, suggesting right atrial pressure of 3 mmHg.  IAS/Shunts: The interatrial septum was not well visualized. No ventricular septal defect is seen or detected. There is no evidence of an atrial septal defect.     LEFT VENTRICLE PLAX 2D LVIDd:         5.70 cm  Diastology LVIDs:         3.30 cm  LV e' lateral:   5.87 cm/s LV PW:         1.00 cm  LV E/e' lateral: 13.1 LV IVS:        1.00 cm  LV e' medial:    5.22 cm/s LVOT diam:     2.10 cm  LV E/e' medial:  14.8 LV SV:         116 ml LV SV Index:   40.08 LVOT Area:     3.46 cm    RIGHT VENTRICLE RV S prime:     17.70 cm/s TAPSE (M-mode): 2.9 cm  LEFT ATRIUM             Index LA diam:        4.20 cm 1.59 cm/m LA Vol (A2C):   42.2 ml 16.01 ml/m LA Vol (A4C):   52.0 ml 19.72 ml/m LA Biplane Vol: 51.6 ml 19.57 ml/m  AORTIC VALVE LVOT Vmax:   157.00 cm/s LVOT Vmean:  102.000 cm/s LVOT VTI:    0.220 m   AORTA Ao Root diam: 3.50 cm  MITRAL VALVE MV Area (PHT): 3.46 cm              SHUNTS MV PHT:        63.51 msec            Systemic VTI:  0.22 m MV Decel Time: 219 msec              Systemic Diam: 2.10 cm MV E velocity: 77.10 cm/s  103 cm/s MV A velocity: 121.00 cm/s 70.3 cm/s MV E/A ratio:  0.64        1.5    Jenkins Rouge MD Electronically signed by Jenkins Rouge MD Signature Date/Time: 01/22/2019/10:40:03 AM       Final   Syngo Images  Show images for ECHOCARDIOGRAM COMPLETE Images on [employer]  Show images for [name] "Brad" Performing Technologist/Nurse  Performing Technologist/Nurse: Michiel Cowboy, RDCS Reason for Exam Priority: Routine Not on file Patient Data  Height  69 in  BP  116/63  mmHg               Surgical History  Surgical History   No past medical history on file.  Other Surgical History   Procedure Laterality Date Comment Source ABDOMINAL SURGERY   [phone]  BACK SURGERY   x 8 -,multiple fusions(cervical to lumbar)  BILIARY STENT PLACEMENT  02/07/2019 Procedure: BILIARY STENT PLACEMENT; Surgeon: Ladene Artist, MD; Location: [facility] ENDOSCOPY; Service: Endoscopy;;  CHOLECYSTECTOMY N/A 02/02/2019 Procedure: attempted LAPAROSCOPIC CHOLECYSTECTOMY; Surgeon: Coralie Keens, MD; Location: [place]; Service: General; Laterality: N/A;  CHOLECYSTECTOMY  02/02/2019 Procedure: Cholecystectomy; Surgeon: Coralie Keens, MD; Location: [place]; Service: General;;  COLONOSCOPY     COLONOSCOPY WITH PROPOFOL N/A 06/14/2014 Procedure: COLONOSCOPY WITH PROPOFOL; Surgeon: Gatha Mayer, MD; Location: [facility] ENDOSCOPY; Service: Endoscopy; Laterality: N/A;  DIAGNOSTIC LAPAROSCOPY     ERCP N/A 02/07/2019 Procedure: ENDOSCOPIC RETROGRADE CHOLANGIOPANCREATOGRAPHY (ERCP); Surgeon: Ladene Artist, MD; Location: [facility] END[facility]COPY; Service: Endoscopy; Laterality: N/A;  ESOPHAGOGASTRODUODENOSCOPY N/A 01/03/2013 Procedure: ESOPHAGOGASTRODUODENOSCOPY (EGD); Surgeon: Arta Silence, MD; Location: [facility] ORS; Service: Endoscopy; Laterality: N/A;  ESOPHAGOGASTRODUODENOSCOPY N/A 01/03/2013 Procedure: ESOPHAGOGASTRODUODENOSCOPY (EGD); Surgeon: Arta Silence, MD; Location: [facility]; Service: Endoscopy; Laterality: N/A;  ESOPHAGOGASTRODUODENOSCOPY N/A 01/23/2013 Procedure: ESOPHAGOGASTRODUODENOSCOPY (EGD); Surgeon: Lear Ng, MD; Location: [facility] ENDOSCOPY; Service: Endoscopy; Laterality: N/A;  ESOPHAGOGASTRODUODENOSCOPY Left 01/27/2013 Procedure: ESOPHAGOGASTRODUODENOSCOPY (EGD); Surgeon: Lear Ng, MD; Location: [place]; Service: Endoscopy; Laterality: Left;  ESOPHAGOGASTRODUODENOSCOPY N/A 06/14/2014 Procedure: ESOPHAGOGASTRODUODENOSCOPY  (EGD); Surgeon: Gatha Mayer, MD; Location: [facility] ENDOSCOPY; Service: Endoscopy; Laterality: N/A;  ESOPHAGOSCOPY N/A 01/01/2013 Procedure: ESOPHAGOSCOPY; Surgeon: Jeryl Columbia, MD; Location: [place]; Service: Endoscopy; Laterality: N/A;  GASTRIC BYPASS  1977 Reversed in 1992 and revision [place] x 2  KNEE SURGERY Left 2004    Implants   No active implants to display in this view. Encounter-Level Documents - 01/20/2019:  Scan on 02/13/2019 10:03 AM by Default, Provider, MD Document on 02/10/2019 2:09 PM by [name], RN: IP After Visit Summary Scan on 01/30/2019 2:30 PM by Default, Provider, MD Document on 01/25/2019 12:48 AM by Jenell Milliner, NT: ED PB Billing Extract Scan on 01/23/2019 4:22 PM by Default, Provider, MD Scan on 01/22/2019 9:15 AM by Default, Provider, MD Scan on 02/10/2019 9:51 AM by Default, Provider, MD Scan on 02/09/2019 12:01 AM by Default, Provider, MD Scan on 01/21/2019 1:05 PM by Default, Provider, MD Electronic signature on 01/20/2019 11:12 PM - 1 of 7 e-signatures recorded Scan on 01/20/2019 10:35 AM by Chalmers Guest J: 01/20/2019   Order-Level Documents - 01/20/2019:  Scan on 02/07/2019 1:12 PM by Ladene Artist, MD Scan on 01/29/2019 2:38 PM by Default, Provider, MD Scan on 01/22/2019 10:40 AM by Default, Provider, MD   Resulted by:  Signed Date/Time  Phone Pager Josue Hector 01/22/2019 10:40 AM [phone]  External Result Report   External Result Report      Reproductive/Obstetrics                             Anesthesia Physical  Anesthesia Plan  ASA: III  Anesthesia Plan: General   Post-op Pain Management:    Induction: Intravenous  PONV Risk Score and Plan: Propofol infusion, Treatment may vary due to age or medical condition and Ondansetron  Airway Management Planned: Oral ETT  Additional Equipment: None  Intra-op Plan:   Post-operative Plan: Extubation in  OR  Informed Consent: I have reviewed the patients History and Physical, chart, labs and discussed the procedure including the risks, benefits and alternatives for the proposed anesthesia with the patient or authorized representative who has indicated his/her understanding and acceptance.     Dental advisory given  Plan Discussed with: CRNA  Anesthesia Plan Comments:         Anesthesia Quick Evaluation

## 2019-04-07 NOTE — Transfer of Care (Signed)
Immediate Anesthesia Transfer of Care Note  Patient: Phillip Maldonado  Procedure(s) Performed: Procedure(s): ENDOSCOPIC RETROGRADE CHOLANGIOPANCREATOGRAPHY (ERCP) (N/A) GASTROINTESTINAL STENT REMOVAL (N/A)  Patient Location: PACU and Endoscopy Unit  Anesthesia Type:General  Level of Consciousness: awake, alert  and oriented  Airway & Oxygen Therapy: Patient Spontanous Breathing and Patient connected to nasal cannula oxygen  Post-op Assessment: Report given to RN and Post -op Vital signs reviewed and stable  Post vital signs: Reviewed and stable  Last Vitals:  Vitals:   04/07/19 1146 04/07/19 1256  BP: (!) 175/83 (!) 177/95  Pulse: 72 78  Resp: 18 20  Temp: 36.5 C 36.6 C  SpO2: A999333 [id number]    Complications: No apparent anesthesia complications

## 2019-04-07 NOTE — Interval H&P Note (Signed)
History and Physical Interval Note:  04/07/2019 12:03 PM  Phillip Maldonado  has presented today for surgery, with the diagnosis of bile leak postopertive.  The various methods of treatment have been discussed with the patient and family. After consideration of risks, benefits and other options for treatment, the patient has consented to  Procedure(s): ENDOSCOPIC RETROGRADE CHOLANGIOPANCREATOGRAPHY (ERCP) (N/A) GASTROINTESTINAL STENT REMOVAL (N/A) as a surgical intervention.  The patient's history has been reviewed, patient examined, no change in status, stable for surgery.  I have reviewed the patient's chart and labs.  Questions were answered to the patient's satisfaction.     Silvano Rusk

## 2019-04-07 NOTE — Op Note (Signed)
[facility] Patient Name: Phillip Maldonado Procedure Date: 04/07/2019 MRN: [id number] Attending MD: Gatha Mayer , MD Date of Birth: 1950/12/21 CSN: [id number] Age: 69 Admit Type: Outpatient Procedure:                ERCP Indications:              Bile leak, Follow-up of bile leak, Biliary stent                            removal Providers:                Gatha Mayer, MD, Baird Cancer, RN, Elmer Ramp.                            Tilden Dome, RN, William Dalton, Technician, Theodora Blow, Technician Referring MD:             Coralie Keens, MD Medicines:                General Anesthesia Complications:            No immediate complications. Estimated blood loss:                            Minimal. Estimated Blood Loss:     Estimated blood loss was minimal. Procedure:                Pre-Anesthesia Assessment:                           - Prior to the procedure, a History and Physical                            was performed, and patient medications and                            allergies were reviewed. The patient's tolerance of                            previous anesthesia was also reviewed. The risks                            and benefits of the procedure and the sedation                            options and risks were discussed with the patient.                            All questions were answered, and informed consent                            was obtained. Prior Anticoagulants: The patient has                            taken no previous  anticoagulant or antiplatelet                            agents. ASA Grade Assessment: III - A patient with                            severe systemic disease. After reviewing the risks                            and benefits, the patient was deemed in                            satisfactory condition to undergo the procedure.                           After obtaining informed consent, the scope was                             passed under direct vision. Throughout the                            procedure, the patient's blood pressure, pulse, and                            oxygen saturations were monitored continuously. The                            [id number] Olympus duodenoscope was                            introduced through the mouth, and used to inject                            contrast into and used to inject contrast into the                            bile duct. The [id number]) Olympus                            gastroscope was introduced through the and used to                            inject contrast into. The ERCP was somewhat                            difficult due to post-surgical anatomy. The patient                            tolerated the procedure well. Scope In: Scope Out: Findings:      A biliary stent was visible on the scout film. The esophagus was       successfully intubated under direct vision. The scope was advanced to a       normal major papilla in the descending duodenum without detailed  examination of the pharynx, larynx and associated structures, and upper       GI tract. Inititally the scope lodged and mildly traumatized a pouch       lateral to GE junction. I inserted a gastroscope after ERCP complete and       confirmed this. Saw known fundic gland polyps in a stomach s/p bariatric       revision.      ERCP findings:      1) plastic stent extining papilla - grasped and removed with snare      2) cholangiogeram performed - normal biliary tree no leak Impression:               - One stent was removed from the biliary tree.                           - A bile leak was not seen. It has healed                           - mild trauma to proximal stomach (pouch) with                            slight bleeding - see above                           - fundic gland polyps in stomach Moderate Sedation:      Not Applicable - Patient  had care per Anesthesia. Recommendation:           - Patient has a contact number available for                            emergencies. The signs and symptoms of potential                            delayed complications were discussed with the                            patient. Return to normal activities tomorrow.                            Written discharge instructions were provided to the                            patient.                           - Continue present medications. Procedure Code(s):        --- Professional ---                           [phone], Endoscopic retrograde                            cholangiopancreatography (ERCP); with removal of                            foreign body(s) or stent(s) from biliary/pancreatic  duct(s) Diagnosis Code(s):        --- Professional ---                           Z46.59, Encounter for fitting and adjustment of                            other gastrointestinal appliance and device                           K83.8, Other specified diseases of biliary tract CPT copyright 2019 [employer] Association. All rights reserved. The codes documented in this report are preliminary and upon coder review may  be revised to meet current compliance requirements. Gatha Mayer, MD 04/07/2019 1:04:12 PM This report has been signed electronically. Number of Addenda: 0

## 2019-04-10 ENCOUNTER — Encounter: Payer: Self-pay | Admitting: *Deleted

## 2019-04-11 DIAGNOSIS — D631 Anemia in chronic kidney disease: Secondary | ICD-10-CM | POA: Diagnosis not present

## 2019-04-11 DIAGNOSIS — E1122 Type 2 diabetes mellitus with diabetic chronic kidney disease: Secondary | ICD-10-CM | POA: Diagnosis not present

## 2019-04-11 DIAGNOSIS — E785 Hyperlipidemia, unspecified: Secondary | ICD-10-CM | POA: Diagnosis not present

## 2019-04-11 DIAGNOSIS — I130 Hypertensive heart and chronic kidney disease with heart failure and stage 1 through stage 4 chronic kidney disease, or unspecified chronic kidney disease: Secondary | ICD-10-CM | POA: Diagnosis not present

## 2019-04-11 DIAGNOSIS — J449 Chronic obstructive pulmonary disease, unspecified: Secondary | ICD-10-CM | POA: Diagnosis not present

## 2019-04-11 DIAGNOSIS — I5032 Chronic diastolic (congestive) heart failure: Secondary | ICD-10-CM | POA: Diagnosis not present

## 2019-04-11 DIAGNOSIS — G709 Myoneural disorder, unspecified: Secondary | ICD-10-CM | POA: Diagnosis not present

## 2019-04-11 DIAGNOSIS — K760 Fatty (change of) liver, not elsewhere classified: Secondary | ICD-10-CM | POA: Diagnosis not present

## 2019-04-11 DIAGNOSIS — E1142 Type 2 diabetes mellitus with diabetic polyneuropathy: Secondary | ICD-10-CM | POA: Diagnosis not present

## 2019-04-11 DIAGNOSIS — N183 Chronic kidney disease, stage 3 unspecified: Secondary | ICD-10-CM | POA: Diagnosis not present

## 2019-04-11 DIAGNOSIS — K5790 Diverticulosis of intestine, part unspecified, without perforation or abscess without bleeding: Secondary | ICD-10-CM | POA: Diagnosis not present

## 2019-04-11 DIAGNOSIS — K5909 Other constipation: Secondary | ICD-10-CM | POA: Diagnosis not present

## 2019-04-11 DIAGNOSIS — I480 Paroxysmal atrial fibrillation: Secondary | ICD-10-CM | POA: Diagnosis not present

## 2019-04-11 DIAGNOSIS — G894 Chronic pain syndrome: Secondary | ICD-10-CM | POA: Diagnosis not present

## 2019-04-11 DIAGNOSIS — K219 Gastro-esophageal reflux disease without esophagitis: Secondary | ICD-10-CM | POA: Diagnosis not present

## 2019-04-11 DIAGNOSIS — D519 Vitamin B12 deficiency anemia, unspecified: Secondary | ICD-10-CM | POA: Diagnosis not present

## 2019-04-11 DIAGNOSIS — Z48815 Encounter for surgical aftercare following surgery on the digestive system: Secondary | ICD-10-CM | POA: Diagnosis not present

## 2019-04-11 DIAGNOSIS — M170 Bilateral primary osteoarthritis of knee: Secondary | ICD-10-CM | POA: Diagnosis not present

## 2019-04-11 DIAGNOSIS — G4733 Obstructive sleep apnea (adult) (pediatric): Secondary | ICD-10-CM | POA: Diagnosis not present

## 2019-04-18 ENCOUNTER — Ambulatory Visit: Payer: Medicare Other | Admitting: Internal Medicine

## 2019-06-26 DIAGNOSIS — G5603 Carpal tunnel syndrome, bilateral upper limbs: Secondary | ICD-10-CM | POA: Diagnosis not present

## 2019-06-26 DIAGNOSIS — Z9889 Other specified postprocedural states: Secondary | ICD-10-CM | POA: Diagnosis not present

## 2019-06-27 DIAGNOSIS — G5603 Carpal tunnel syndrome, bilateral upper limbs: Secondary | ICD-10-CM | POA: Insufficient documentation

## 2019-07-05 DIAGNOSIS — I739 Peripheral vascular disease, unspecified: Secondary | ICD-10-CM | POA: Diagnosis not present

## 2019-07-05 DIAGNOSIS — Z9189 Other specified personal risk factors, not elsewhere classified: Secondary | ICD-10-CM | POA: Diagnosis not present

## 2019-07-05 DIAGNOSIS — Z01812 Encounter for preprocedural laboratory examination: Secondary | ICD-10-CM | POA: Diagnosis not present

## 2019-07-10 DIAGNOSIS — M65262 Calcific tendinitis, left lower leg: Secondary | ICD-10-CM | POA: Diagnosis not present

## 2019-07-10 DIAGNOSIS — M170 Bilateral primary osteoarthritis of knee: Secondary | ICD-10-CM | POA: Diagnosis not present

## 2019-07-10 DIAGNOSIS — M25462 Effusion, left knee: Secondary | ICD-10-CM | POA: Diagnosis not present

## 2019-07-10 DIAGNOSIS — M25461 Effusion, right knee: Secondary | ICD-10-CM | POA: Diagnosis not present

## 2019-07-10 DIAGNOSIS — M65261 Calcific tendinitis, right lower leg: Secondary | ICD-10-CM | POA: Diagnosis not present

## 2019-07-11 DIAGNOSIS — G4700 Insomnia, unspecified: Secondary | ICD-10-CM | POA: Diagnosis not present

## 2019-07-11 DIAGNOSIS — Z79899 Other long term (current) drug therapy: Secondary | ICD-10-CM | POA: Diagnosis not present

## 2019-07-11 DIAGNOSIS — N183 Chronic kidney disease, stage 3 unspecified: Secondary | ICD-10-CM | POA: Diagnosis not present

## 2019-07-11 DIAGNOSIS — E1140 Type 2 diabetes mellitus with diabetic neuropathy, unspecified: Secondary | ICD-10-CM | POA: Diagnosis not present

## 2019-07-11 DIAGNOSIS — Z794 Long term (current) use of insulin: Secondary | ICD-10-CM | POA: Diagnosis not present

## 2019-07-11 DIAGNOSIS — G5601 Carpal tunnel syndrome, right upper limb: Secondary | ICD-10-CM | POA: Diagnosis not present

## 2019-07-11 DIAGNOSIS — Z9119 Patient's noncompliance with other medical treatment and regimen: Secondary | ICD-10-CM | POA: Diagnosis not present

## 2019-07-11 DIAGNOSIS — E1122 Type 2 diabetes mellitus with diabetic chronic kidney disease: Secondary | ICD-10-CM | POA: Diagnosis not present

## 2019-07-11 DIAGNOSIS — K219 Gastro-esophageal reflux disease without esophagitis: Secondary | ICD-10-CM | POA: Diagnosis not present

## 2019-07-11 DIAGNOSIS — I130 Hypertensive heart and chronic kidney disease with heart failure and stage 1 through stage 4 chronic kidney disease, or unspecified chronic kidney disease: Secondary | ICD-10-CM | POA: Diagnosis not present

## 2019-07-11 DIAGNOSIS — G4733 Obstructive sleep apnea (adult) (pediatric): Secondary | ICD-10-CM | POA: Diagnosis not present

## 2019-07-11 DIAGNOSIS — I5032 Chronic diastolic (congestive) heart failure: Secondary | ICD-10-CM | POA: Diagnosis not present

## 2019-07-11 DIAGNOSIS — Z9989 Dependence on other enabling machines and devices: Secondary | ICD-10-CM | POA: Diagnosis not present

## 2019-07-25 DIAGNOSIS — G5601 Carpal tunnel syndrome, right upper limb: Secondary | ICD-10-CM | POA: Diagnosis not present

## 2019-07-25 DIAGNOSIS — Z4789 Encounter for other orthopedic aftercare: Secondary | ICD-10-CM | POA: Diagnosis not present

## 2019-08-23 DIAGNOSIS — Z9889 Other specified postprocedural states: Secondary | ICD-10-CM | POA: Diagnosis not present

## 2019-08-23 DIAGNOSIS — G5603 Carpal tunnel syndrome, bilateral upper limbs: Secondary | ICD-10-CM | POA: Diagnosis not present

## 2019-09-12 DIAGNOSIS — E1165 Type 2 diabetes mellitus with hyperglycemia: Secondary | ICD-10-CM | POA: Diagnosis not present

## 2019-09-12 DIAGNOSIS — E1169 Type 2 diabetes mellitus with other specified complication: Secondary | ICD-10-CM | POA: Diagnosis not present

## 2019-09-12 DIAGNOSIS — G63 Polyneuropathy in diseases classified elsewhere: Secondary | ICD-10-CM | POA: Diagnosis not present

## 2019-09-12 DIAGNOSIS — E785 Hyperlipidemia, unspecified: Secondary | ICD-10-CM | POA: Diagnosis not present

## 2019-09-12 DIAGNOSIS — Z794 Long term (current) use of insulin: Secondary | ICD-10-CM | POA: Diagnosis not present

## 2019-09-12 DIAGNOSIS — N183 Chronic kidney disease, stage 3 unspecified: Secondary | ICD-10-CM | POA: Diagnosis not present

## 2019-09-20 DIAGNOSIS — R202 Paresthesia of skin: Secondary | ICD-10-CM | POA: Diagnosis not present

## 2019-09-20 DIAGNOSIS — R200 Anesthesia of skin: Secondary | ICD-10-CM | POA: Diagnosis not present

## 2019-09-20 DIAGNOSIS — E1165 Type 2 diabetes mellitus with hyperglycemia: Secondary | ICD-10-CM | POA: Diagnosis not present

## 2019-09-20 DIAGNOSIS — R0989 Other specified symptoms and signs involving the circulatory and respiratory systems: Secondary | ICD-10-CM | POA: Diagnosis not present

## 2019-10-04 DIAGNOSIS — G5603 Carpal tunnel syndrome, bilateral upper limbs: Secondary | ICD-10-CM | POA: Diagnosis not present

## 2020-04-23 DIAGNOSIS — L219 Seborrheic dermatitis, unspecified: Secondary | ICD-10-CM | POA: Insufficient documentation

## 2020-04-23 DIAGNOSIS — I872 Venous insufficiency (chronic) (peripheral): Secondary | ICD-10-CM | POA: Insufficient documentation

## 2020-05-17 IMAGING — DX DG CHEST 1V PORT
1 series · 1 of 1 positions shown · non-contrast
Comparison: 01/23/2019.

CLINICAL DATA: Shortness of breath.

EXAM:
PORTABLE CHEST 1 VIEW

[chest ap]
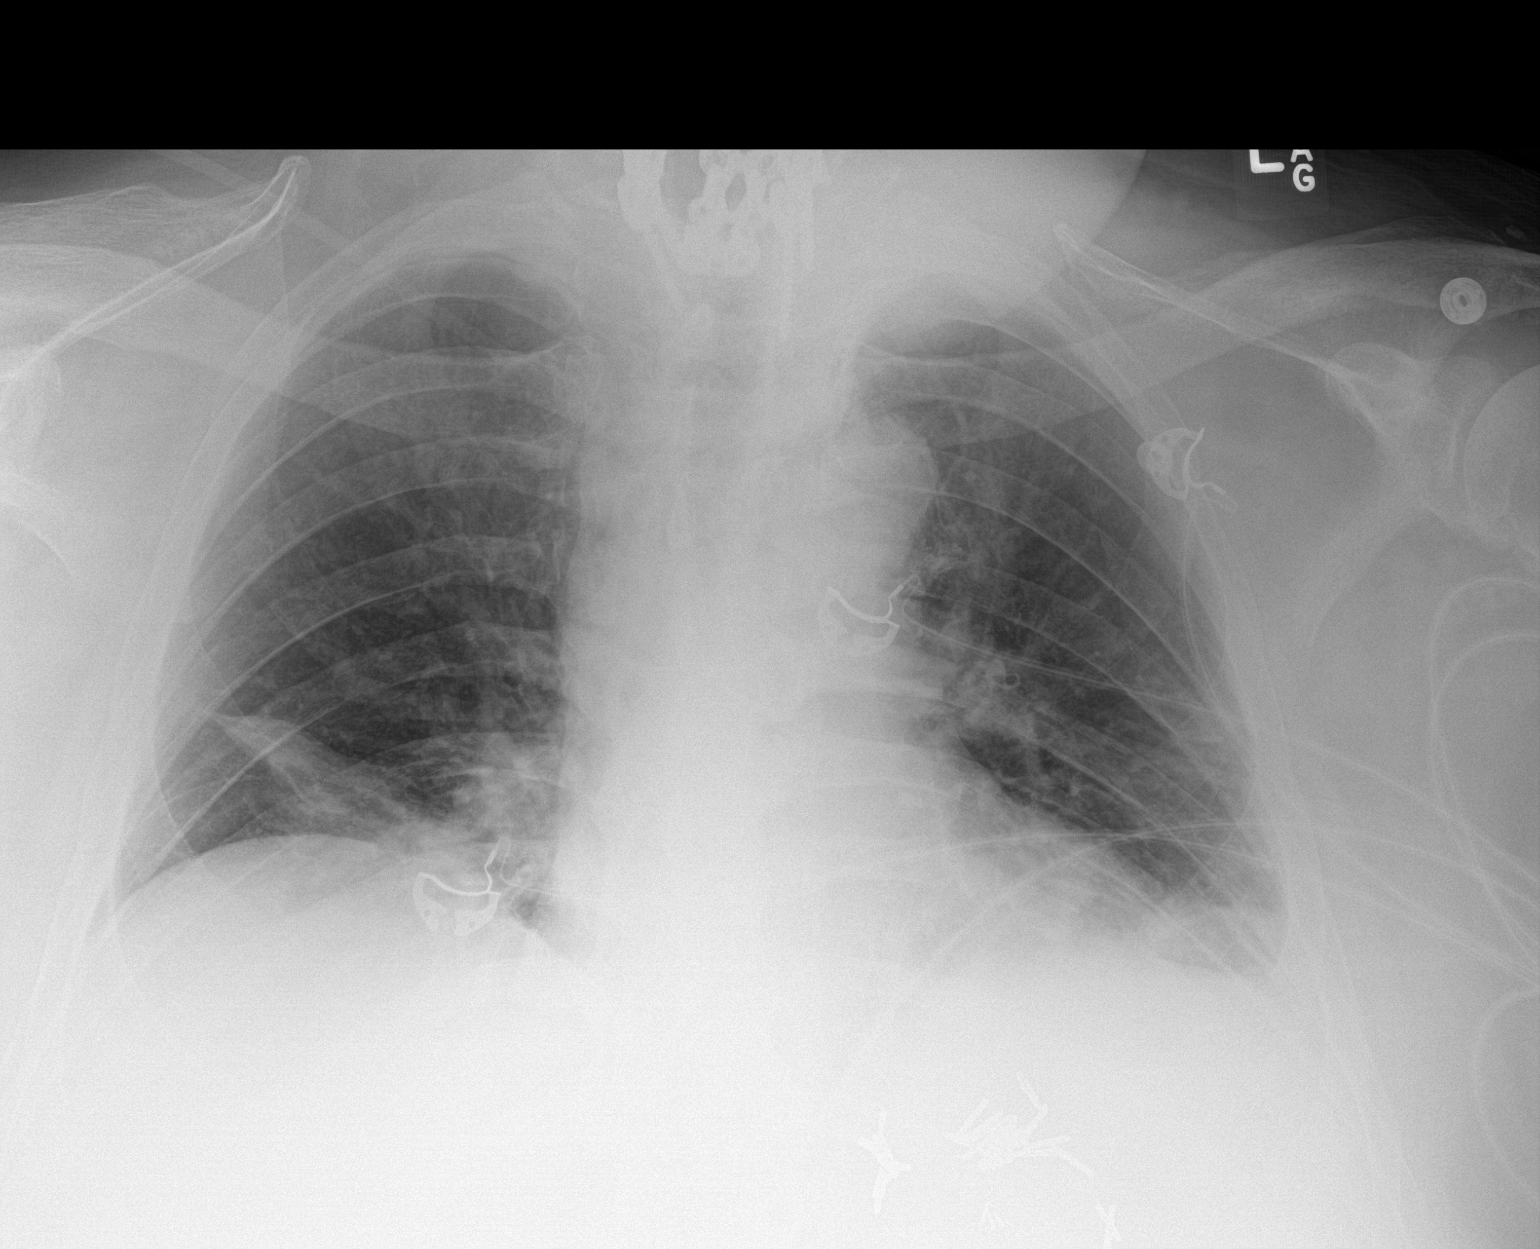

[1 of 1 positions shown; findings below may reference images not displayed]

FINDINGS: Interim removal of right IJ line. Stable cardiomegaly. Bibasilar
atelectasis/infiltrates., no change from prior exam. Small left
pleural effusion cannot be excluded on today's exam. No
pneumothorax. Prior cervical spine fusion. Surgical clips upper
abdomen.
IMPRESSION: 1. Bibasilar atelectasis/infiltrates again noted. No interim change.
Small left pleural effusion cannot be excluded on today's exam.

2.  Stable cardiomegaly.

3.  Interim removal of right IJ line.

## 2020-07-29 IMAGING — RF DG ERCP WO/W SPHINCTEROTOMY
1 series · 3 of 3 positions shown · non-contrast
Comparison: 02/07/2019

CLINICAL DATA: 68-year-old male with a history of bile leak

EXAM:
ERCP
TECHNIQUE: Multiple spot images obtained with the fluoroscopic device and
submitted for interpretation post-procedure.
FLUOROSCOPY TIME:  Fluoroscopy Time:  1 minutes 39 seconds

[Series 1: run · 3 of 3 slices shown]
[im 1/3]
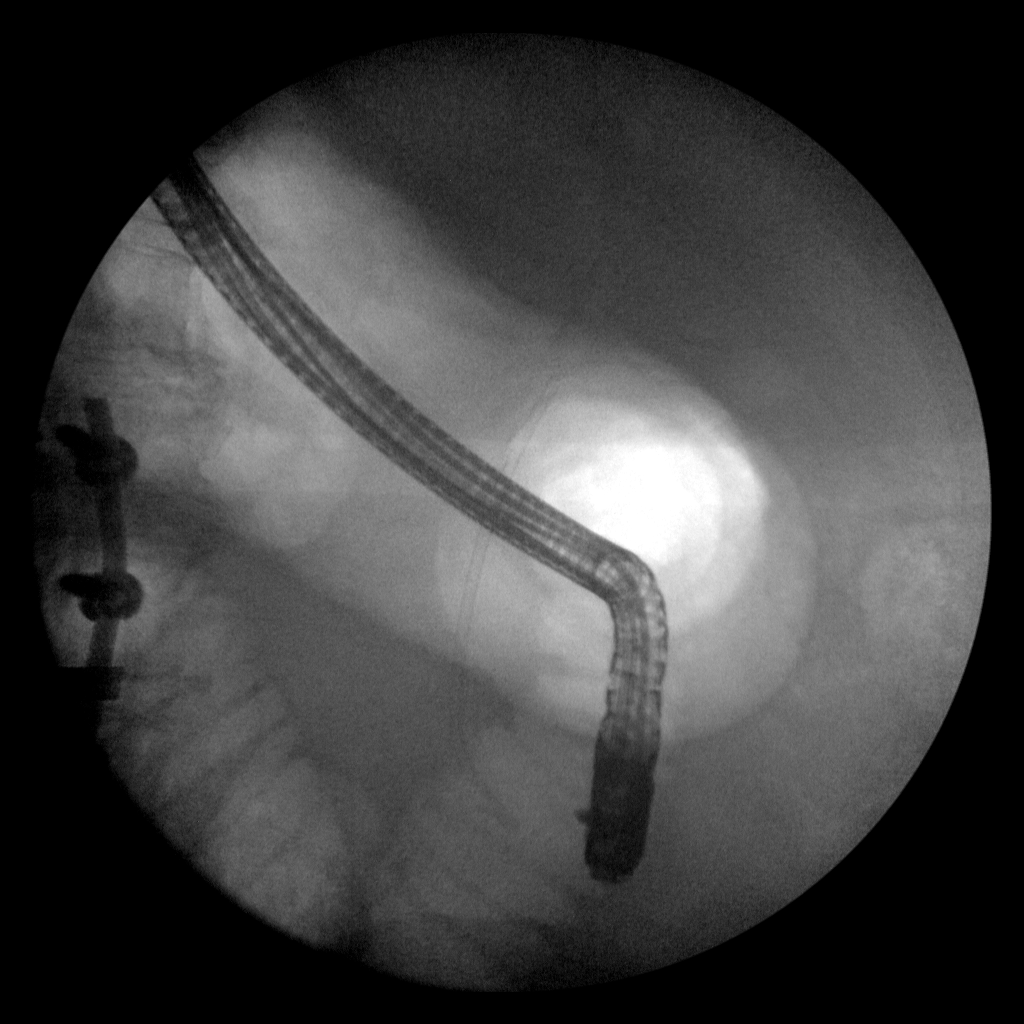
[im 2/3]
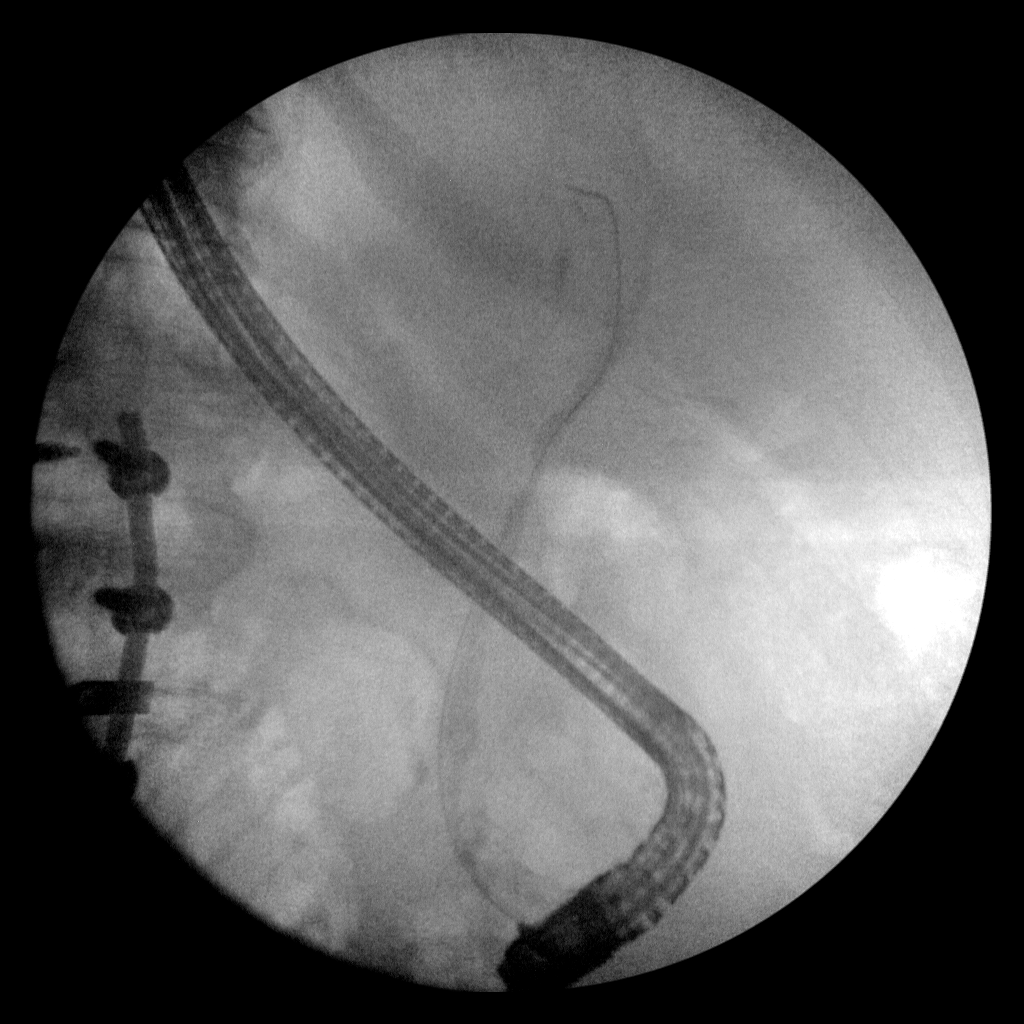
[im 3/3]
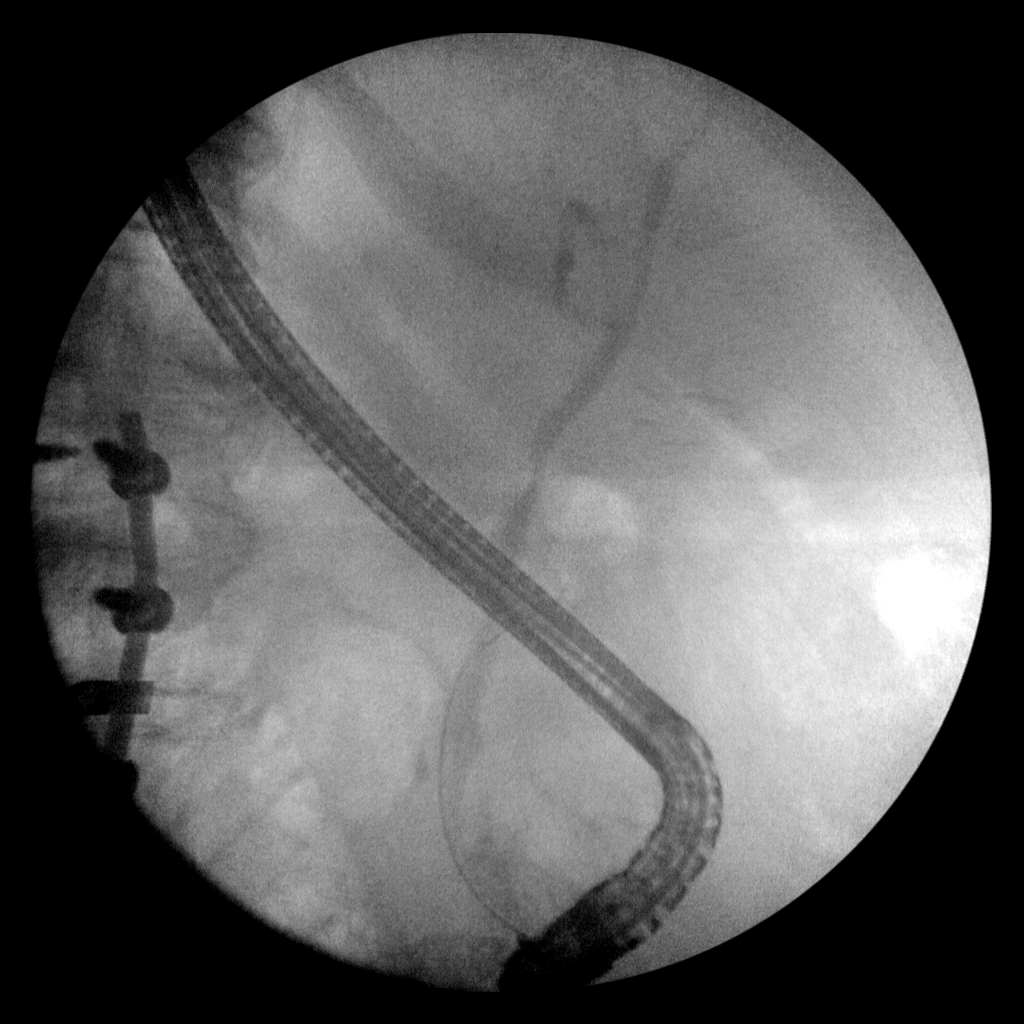

[3 of 3 positions shown; findings below may reference images not displayed]

FINDINGS: Limited intraoperative fluoroscopic spot images during ERCP.

Initial image demonstrates endoscope projecting over the upper
abdomen with plastic biliary stent in position.

Subsequently there is removal of the stent and cannulation with a
safety wire. Partial opacification of the extrahepatic biliary
ducts.

No definite extraluminal contrast.
IMPRESSION: Limited images during ERCP demonstrates retrieval of plastic biliary
stent, as above. Please refer to the dictated operative report for
full details of intraoperative findings and procedure.

## 2022-01-21 ENCOUNTER — Encounter (HOSPITAL_COMMUNITY): Payer: Self-pay | Admitting: Emergency Medicine

## 2022-01-21 ENCOUNTER — Inpatient Hospital Stay (HOSPITAL_COMMUNITY)
Admission: EM | Admit: 2022-01-21 | Discharge: 2022-01-24 | DRG: 321 | Disposition: A | Discharge status: Home / Self Care | Payer: Medicare Other | Attending: Cardiovascular Disease | Admitting: Cardiovascular Disease

## 2022-01-21 ENCOUNTER — Other Ambulatory Visit: Payer: Self-pay

## 2022-01-21 ENCOUNTER — Emergency Department (HOSPITAL_COMMUNITY): Payer: Medicare Other

## 2022-01-21 DIAGNOSIS — I252 Old myocardial infarction: Secondary | ICD-10-CM

## 2022-01-21 DIAGNOSIS — Z91040 Latex allergy status: Secondary | ICD-10-CM

## 2022-01-21 DIAGNOSIS — I214 Non-ST elevation (NSTEMI) myocardial infarction: Secondary | ICD-10-CM | POA: Diagnosis not present

## 2022-01-21 DIAGNOSIS — K219 Gastro-esophageal reflux disease without esophagitis: Secondary | ICD-10-CM | POA: Diagnosis present

## 2022-01-21 DIAGNOSIS — E1140 Type 2 diabetes mellitus with diabetic neuropathy, unspecified: Secondary | ICD-10-CM | POA: Diagnosis present

## 2022-01-21 DIAGNOSIS — F32A Depression, unspecified: Secondary | ICD-10-CM | POA: Diagnosis present

## 2022-01-21 DIAGNOSIS — I130 Hypertensive heart and chronic kidney disease with heart failure and stage 1 through stage 4 chronic kidney disease, or unspecified chronic kidney disease: Secondary | ICD-10-CM | POA: Diagnosis present

## 2022-01-21 DIAGNOSIS — I509 Heart failure, unspecified: Secondary | ICD-10-CM

## 2022-01-21 DIAGNOSIS — G8929 Other chronic pain: Secondary | ICD-10-CM | POA: Diagnosis present

## 2022-01-21 DIAGNOSIS — Z955 Presence of coronary angioplasty implant and graft: Secondary | ICD-10-CM

## 2022-01-21 DIAGNOSIS — Z794 Long term (current) use of insulin: Secondary | ICD-10-CM

## 2022-01-21 DIAGNOSIS — E7800 Pure hypercholesterolemia, unspecified: Secondary | ICD-10-CM | POA: Diagnosis present

## 2022-01-21 DIAGNOSIS — I5043 Acute on chronic combined systolic (congestive) and diastolic (congestive) heart failure: Secondary | ICD-10-CM

## 2022-01-21 DIAGNOSIS — I4891 Unspecified atrial fibrillation: Secondary | ICD-10-CM | POA: Diagnosis present

## 2022-01-21 DIAGNOSIS — G4733 Obstructive sleep apnea (adult) (pediatric): Secondary | ICD-10-CM | POA: Diagnosis present

## 2022-01-21 DIAGNOSIS — Z8261 Family history of arthritis: Secondary | ICD-10-CM

## 2022-01-21 DIAGNOSIS — I452 Bifascicular block: Secondary | ICD-10-CM | POA: Diagnosis present

## 2022-01-21 DIAGNOSIS — I5033 Acute on chronic diastolic (congestive) heart failure: Secondary | ICD-10-CM | POA: Diagnosis present

## 2022-01-21 DIAGNOSIS — M1990 Unspecified osteoarthritis, unspecified site: Secondary | ICD-10-CM | POA: Diagnosis present

## 2022-01-21 DIAGNOSIS — Z7984 Long term (current) use of oral hypoglycemic drugs: Secondary | ICD-10-CM

## 2022-01-21 DIAGNOSIS — I2510 Atherosclerotic heart disease of native coronary artery without angina pectoris: Secondary | ICD-10-CM | POA: Diagnosis present

## 2022-01-21 DIAGNOSIS — Z6841 Body Mass Index (BMI) 40.0 and over, adult: Secondary | ICD-10-CM

## 2022-01-21 DIAGNOSIS — M549 Dorsalgia, unspecified: Secondary | ICD-10-CM | POA: Diagnosis present

## 2022-01-21 DIAGNOSIS — Z888 Allergy status to other drugs, medicaments and biological substances status: Secondary | ICD-10-CM

## 2022-01-21 DIAGNOSIS — E1122 Type 2 diabetes mellitus with diabetic chronic kidney disease: Secondary | ICD-10-CM | POA: Diagnosis present

## 2022-01-21 DIAGNOSIS — E876 Hypokalemia: Secondary | ICD-10-CM | POA: Diagnosis not present

## 2022-01-21 DIAGNOSIS — K760 Fatty (change of) liver, not elsewhere classified: Secondary | ICD-10-CM | POA: Diagnosis present

## 2022-01-21 DIAGNOSIS — N182 Chronic kidney disease, stage 2 (mild): Secondary | ICD-10-CM | POA: Diagnosis present

## 2022-01-21 DIAGNOSIS — I10 Essential (primary) hypertension: Secondary | ICD-10-CM | POA: Diagnosis present

## 2022-01-21 DIAGNOSIS — Z79899 Other long term (current) drug therapy: Secondary | ICD-10-CM

## 2022-01-21 LAB — CBC
HCT: 46.3 % (ref 39.0–52.0)
Hemoglobin: 14.9 g/dL (ref 13.0–17.0)
MCH: 31.6 pg (ref 26.0–34.0)
MCHC: 32.2 g/dL (ref 30.0–36.0)
MCV: 98.1 fL (ref 80.0–100.0)
Platelets: 200 10*3/uL (ref 150–400)
RBC: 4.72 MIL/uL (ref 4.22–5.81)
RDW: 14.2 % (ref 11.5–15.5)
WBC: 8.7 10*3/uL (ref 4.0–10.5)
nRBC: 0 % (ref 0.0–0.2)

## 2022-01-21 LAB — BASIC METABOLIC PANEL
Anion gap: 14 (ref 5–15)
BUN: 17 mg/dL (ref 8–23)
CO2: 28 mmol/L (ref 22–32)
Calcium: 9.6 mg/dL (ref 8.9–10.3)
Chloride: 100 mmol/L (ref 98–111)
Creatinine, Ser: 1.14 mg/dL (ref 0.61–1.24)
GFR, Estimated: >60 mL/min (ref >60)
Glucose, Bld: 174 mg/dL — ABNORMAL HIGH (ref 70–99)
Potassium: 3.9 mmol/L (ref 3.5–5.1)
Sodium: 142 mmol/L (ref 135–145)

## 2022-01-21 LAB — BRAIN NATRIURETIC PEPTIDE: B Natriuretic Peptide: 49.4 pg/mL (ref 0.0–100.0)

## 2022-01-21 LAB — TROPONIN I (HIGH SENSITIVITY): Troponin I (High Sensitivity): 2264 ng/L (ref <18)

## 2022-01-21 MED ORDER — HEPARIN (PORCINE) 25000 UT/250ML-% IV SOLN
14.0000 [IU]/kg/h | INTRAVENOUS | Status: DC
  Administered 2022-01-22: 14 [IU]/kg/h via INTRAVENOUS
  Filled 2022-01-21: qty 250

## 2022-01-21 MED ORDER — NITROGLYCERIN IN D5W 200-5 MCG/ML-% IV SOLN
0.0000 ug/min | INTRAVENOUS | Status: DC
  Administered 2022-01-22: 5 ug/min via INTRAVENOUS
  Filled 2022-01-21: qty 250

## 2022-01-21 MED ORDER — ASPIRIN 81 MG PO CHEW
324.0000 mg | CHEWABLE_TABLET | Freq: Once | ORAL | Status: AC
  Administered 2022-01-22: 324 mg via ORAL
  Filled 2022-01-21: qty 4

## 2022-01-21 MED ORDER — SODIUM CHLORIDE 0.9 % IV SOLN: INTRAVENOUS | Status: DC

## 2022-01-21 NOTE — ED Triage Notes (Signed)
Patient presents to the ED with chest pain that began around 4:30am that woke him out of sleep this morning.  Patient states pain has continued and now feels like pressure on the center of his chest and radiates to his right arm and up the right side of his neck.  Patient states he has history of diabetes and CHF.  Denies history of heart attack.  Patient reports some shortness of breath.  Patient is speaking in full and clear sentences.  Patient is alert and oriented x 4.  Patient is not in obvious distress at this time.

## 2022-01-21 NOTE — ED Provider Triage Note (Signed)
Emergency Medicine Provider Triage Evaluation Note  TAUNO FALOTICO , a 71 y.o. male  was evaluated in triage.  Pt complains of chest pain which began around 5 AM this morning, reporting severe substernal sharp pain and pressure like someone is pushing down on his chest.  Reports he felt short of breath with this.  Has had some shortness of breath along with headache since his symptoms began.  Prior history of CHF, does not feel that he is fluid overloaded at this time.  Review of Systems  Positive: Chest pain, sob Negative: Fever, cough  Physical Exam  BP (!) 169/98 (BP Location: Left Arm)   Pulse 80   Temp 99 F (37.2 C) (Oral)   Resp 16   Ht '5\' 8"'$  (1.727 m)   Wt (!) 181.4 kg   SpO2 94%   BMI 60.82 kg/m  Gen:   Awake, no distress   Resp:  Normal effort  MSK:   Moves extremities without difficulty  Other:  Lungs are difficult to auscultate due to body habitus, no pain with palpation of his chest.  Medical Decision Making  Medically screening exam initiated at 4:46 PM.  Appropriate orders placed.  HOSAM MCFETRIDGE was informed that the remainder of the evaluation will be completed by another provider, this initial triage assessment does not replace that evaluation, and the importance of remaining in the ED until their evaluation is complete.     Janeece Fitting, PA-C 01/21/22 1649

## 2022-01-21 NOTE — ED Provider Notes (Signed)
[facility] EMERGENCY DEPARTMENT Provider Note   CSN: [id number] Arrival date & time: 01/21/22  1613     History  Chief Complaint  Patient presents with   Chest Pain    Phillip Maldonado is a 71 y.o. male.  HPI     This is a 70 year old male with history of diabetes, hypertension, hyperlipidemia, CHF who presents with chest pain.  Patient reports onset of chest pain that woke him up at 4:30 AM this morning.  He reports a pressure-like chest pain that radiates into his arm and has since begun to radiate into his jaw.  He is fairly sedentary so has not had a significant relationship with exertion; however, he states he has developed shortness of breath with exertion.  He is having ongoing pain at this time.  Currently he rates his pain an 8 out of 10.  He took a baby aspirin this morning.  Home Medications Prior to Admission medications   Medication Sig Start Date End Date Taking? Authorizing Provider  carvedilol (COREG) 12.5 MG tablet Take 12.5 mg by mouth 2 (two) times daily.   Yes [provider]  acetaminophen (TYLENOL) 500 MG tablet Take 2 tablets (1,000 mg total) by mouth every 8 (eight) hours. Patient taking differently: Take 1,000 mg by mouth 2 (two) times daily.  02/10/19   Harold Hedge, MD  amLODipine (NORVASC) 5 MG tablet Take 5 mg by mouth daily. 09/19/18   [provider]  atorvastatin (LIPITOR) 40 MG tablet Take 1 tablet (40 mg total) by mouth daily. 02/22/17   [name], NP  cholecalciferol (VITAMIN D3) 25 MCG (1000 UT) tablet Take 1,000 Units by mouth daily.    [provider]  docusate sodium (COLACE) 100 MG capsule Take 100 mg by mouth 2 (two) times daily as needed for mild constipation.    [provider]  DULoxetine (CYMBALTA) 60 MG capsule Take 1 capsule (60 mg total) by mouth daily. 02/11/19 03/30/20  Harold Hedge, MD  glucose blood (ACCU-CHEK AVIVA PLUS) test strip 1 each by Other route See admin  instructions. Check blood sugar twice daily    [provider]  insulin glargine (LANTUS) 100 UNIT/ML injection Inject 0.2 mLs (20 Units total) into the skin daily at 10 pm. Patient taking differently: Inject 20 Units into the skin at bedtime.  02/10/19   Harold Hedge, MD  metFORMIN (GLUCOPHAGE) 1000 MG tablet Take 1,000 mg by mouth 2 (two) times daily.    [provider]  metoprolol succinate (TOPROL-XL) 100 MG 24 hr tablet Take 100 mg by mouth daily. 09/22/18   [provider]  Multiple Vitamin (MULITIVITAMIN WITH MINERALS) TABS Take 1 tablet by mouth daily.    [provider]  pantoprazole (PROTONIX) 40 MG tablet Take 1 tablet (40 mg total) by mouth daily before breakfast. 04/07/19   Gatha Mayer, MD  pregabalin (LYRICA) 150 MG capsule Take 150 mg by mouth 3 (three) times daily.    [provider]  Thiamine HCl (VITAMIN B1 PO) Take 1 tablet by mouth daily.    [provider]  torsemide (DEMADEX) 10 MG tablet Take 5 tablets (50 mg total) by mouth every Monday, Wednesday, and Friday. 02/13/19 03/30/20  Harold Hedge, MD  torsemide (DEMADEX) 100 MG tablet Take 100 mg by mouth daily. 04/02/19   [provider]  traZODone (DESYREL) 300 MG tablet Take 1 tablet (300 mg total) by mouth at bedtime. 02/10/19 03/30/20  Harold Hedge, MD  Wheat Dextrin (BENEFIBER PO) Take 12.6 g by mouth daily.    [provider]      Allergies    Chlorzoxazone, Piroxicam, Latex, and Adhesive [tape]    Review of Systems   Review of Systems  Constitutional:  Negative for fever.  Respiratory:  Negative for shortness of breath.   Cardiovascular:  Positive for chest pain. Negative for leg swelling.  All other systems reviewed and are negative.   Physical Exam Updated Vital Signs BP (!) 143/112 (BP Location: Right Arm)   Pulse 82   Temp 98.4 F (36.9 C) (Oral)   Resp 18   Ht 1.727 m ('5\' 8"'$ )   Wt (!) 181.4 kg   SpO2 95%   BMI 60.82 kg/m   Physical Exam Vitals and nursing note reviewed.  Constitutional:      Appearance: He is well-developed.     Comments: Morbidly obese, nontoxic-appearing  HENT:     Head: Normocephalic and atraumatic.  Eyes:     Pupils: Pupils are equal, round, and reactive to light.  Cardiovascular:     Rate and Rhythm: Normal rate and regular rhythm.     Heart sounds: Normal heart sounds. No murmur heard. Pulmonary:     Effort: Pulmonary effort is normal. No respiratory distress.     Breath sounds: Normal breath sounds. No wheezing.  Abdominal:     General: Bowel sounds are normal.     Palpations: Abdomen is soft.     Tenderness: There is no abdominal tenderness. There is no rebound.     Comments: Extensive scarring over the abdomen  Musculoskeletal:     Cervical back: Neck supple.  Lymphadenopathy:     Cervical: No cervical adenopathy.  Skin:    General: Skin is warm and dry.  Neurological:     Mental Status: He is alert and oriented to person, place, and time.  Psychiatric:        Mood and Affect: Mood normal.     ED Results / Procedures / Treatments   Labs (all labs ordered are listed, but only abnormal results are displayed) Labs Reviewed  BASIC METABOLIC PANEL - Abnormal; Notable for the following components:      Result Value   Glucose, Bld 174 (*)    All other components within normal limits  TROPONIN I (HIGH SENSITIVITY) - Abnormal; Notable for the following components:   Troponin I (High Sensitivity) 2,264 (*)    All other components within normal limits  CBC  BRAIN NATRIURETIC PEPTIDE  HEMOGLOBIN A1C  PROTIME-INR  APTT  LIPID PANEL  TROPONIN I (HIGH SENSITIVITY)    EKG EKG Interpretation  Date/Time:  Wednesday January 21 2022 16:16:18 EST Ventricular Rate:  85 PR Interval:  208 QRS Duration: 142 QT Interval:  412 QTC Calculation: 490 R Axis:   -54 Text Interpretation: Normal sinus rhythm Right bundle branch block Left anterior fascicular block  Bifascicular  block  Abnormal ECG When compared with ECG of 24-Jan-2019 23:23, PREVIOUS ECG IS PRESENT Confirmed by Thayer Jew [phone]) on 01/21/2022 11:06:28 PM  Radiology DG Chest 1 View  Result Date: 01/21/2022 CLINICAL DATA:  Chest pain EXAM: CHEST  1 VIEW COMPARISON:  01/25/2019 FINDINGS: The patient's head and chin obscure parts of the apices. Streaky atelectasis or scarring left base. No focal consolidation or effusion. Stable cardiomediastinal silhouette with aortic atherosclerosis. No pneumothorax. Multiple clips in the left upper quadrant IMPRESSION: No active disease. Streaky atelectasis or scarring at the left  base. Electronically Signed   By: Donavan Foil M.D.   On: 01/21/2022 17:14    Procedures .Critical Care  Performed by: Merryl Hacker, MD Authorized by: Merryl Hacker, MD   Critical care provider statement:    Critical care time (minutes):  45   Critical care was necessary to treat or prevent imminent or life-threatening deterioration of the following conditions:  Cardiac failure (NSTEMI)   Critical care was time spent personally by me on the following activities:  Development of treatment plan with patient or surrogate, discussions with consultants, evaluation of patient's response to treatment, examination of patient, ordering and review of laboratory studies, ordering and review of radiographic studies, ordering and performing treatments and interventions, pulse oximetry, re-evaluation of patient's condition and review of old charts     Medications Ordered in ED Medications  0.9 %  sodium chloride infusion (has no administration in time range)  aspirin chewable tablet 324 mg (has no administration in time range)  heparin ADULT infusion 100 units/mL (25000 units/2101m) (has no administration in time range)  nitroGLYCERIN 50 mg in dextrose 5 % 250 mL (0.2 mg/mL) infusion (has no administration in time range)    ED Course/ Medical Decision Making/ A&P                            Medical Decision Making Amount and/or Complexity of Data Reviewed Labs: ordered.  Risk OTC drugs. Prescription drug management. Decision regarding hospitalization.   This patient presents to the ED for concern of chest pain, this involves an extensive number of treatment options, and is a complaint that carries with it a high risk of complications and morbidity.  I considered the following differential and admission for this acute, potentially life threatening condition.  The differential diagnosis includes primary ACS, CHF, pneumonia, pneumothorax, PE  MDM:    This is a 71year old male who presents with chest pain.  He is having ongoing chest pain.  He is overall nontoxic.  Blood pressure 143/112.  EKG shows no change from prior with existing right bundle branch block.  Labs obtained and reviewed from triage.  Initial troponin significantly elevated greater than 2200.  Given ongoing pain, risk factors, and description, highly suspect ACS.  Meets NSTEMI criteria.  Full dose aspirin, heparin, nitroglycerin ordered given ongoing chest discomfort.  Have repeated EKG.  Other labs include a normal BNP, no significant metabolic derangements.  Chest x-ray without obvious pleural effusion or pneumothorax.  Cardiology consulted for concern for NSTEMI.  (Labs, imaging, consults)  Labs: I Ordered, and personally interpreted labs.  The pertinent results include: Troponin 2264.  Imaging Studies ordered: I ordered imaging studies including chest x-ray I independently visualized and interpreted imaging. I agree with the radiologist interpretation  Additional history obtained from wife at bedside.  External records from outside source obtained and reviewed including prior stress test in 2015  Cardiac Monitoring: The patient was maintained on a cardiac monitor.  I personally viewed and interpreted the cardiac monitored which showed an underlying rhythm of: NSR  Reevaluation: After the  interventions noted above, I reevaluated the patient and found that they have :stayed the same  Social Determinants of Health:  lives independently  Disposition: Admit  Co morbidities that complicate the patient evaluation  Past Medical History:  Diagnosis Date   Anemia    Angina    Anxiety    Arthritis    Atrial fibrillation with RVR ([facility] 01/24/2013  Blood transfusion    last 10' 14- "GI bleed"   CHF (congestive heart failure) (HCC)    CKD (chronic kidney disease) stage 3, GFR 30-59 ml/min ([place]) 01/08/2016   Depression    Diabetes mellitus ([place]) 09/18/2011   Diverticulosis    DJD (degenerative joint disease)    DM type 2, uncontrolled, 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 06-04-14 "doesn't use much now, no mask/tubing now.     Medicines Meds ordered this encounter  Medications   0.9 %  sodium chloride infusion   aspirin chewable tablet 324 mg   heparin ADULT infusion 100 units/mL (25000 units/234m)   nitroGLYCERIN 50 mg in dextrose 5 % 250 mL (0.2 mg/mL) infusion    I have reviewed the patients home medicines and have made adjustments as needed  Problem List / ED Course: Problem List Items Addressed This Visit   None Visit Diagnoses     [facility]STEMI (non-ST elevated myocardial infarction) ([facility]    -  Primary   Relevant Medications   aspirin chewable tablet 324 mg   heparin ADULT infusion 100 units/mL (25000 units/2546m   nitroGLYCERIN 50 mg in dextrose 5 % 250 mL (0.2 mg/mL)  infusion   carvedilol (COREG) 12.5 MG tablet                   Final Clinical Impression(s) / ED Diagnoses Final diagnoses:  [facility] elevated [facility]) [facility]   Rx / DC Orders ED Discharge Orders     None         HoMerryl HackerMD 01/21/22 2350

## 2022-01-22 ENCOUNTER — Other Ambulatory Visit (HOSPITAL_COMMUNITY): Payer: Self-pay

## 2022-01-22 ENCOUNTER — Observation Stay (HOSPITAL_COMMUNITY): Payer: Medicare Other

## 2022-01-22 ENCOUNTER — Inpatient Hospital Stay (HOSPITAL_COMMUNITY)
Admission: EM | Disposition: A | Discharge status: Home / Self Care | Payer: Self-pay | Source: Home / Self Care | Attending: Cardiovascular Disease

## 2022-01-22 ENCOUNTER — Encounter (HOSPITAL_COMMUNITY): Payer: Self-pay | Admitting: Internal Medicine

## 2022-01-22 DIAGNOSIS — I4891 Unspecified atrial fibrillation: Secondary | ICD-10-CM | POA: Diagnosis present

## 2022-01-22 DIAGNOSIS — I2510 Atherosclerotic heart disease of native coronary artery without angina pectoris: Secondary | ICD-10-CM | POA: Diagnosis present

## 2022-01-22 DIAGNOSIS — Z6841 Body Mass Index (BMI) 40.0 and over, adult: Secondary | ICD-10-CM | POA: Diagnosis not present

## 2022-01-22 DIAGNOSIS — I452 Bifascicular block: Secondary | ICD-10-CM | POA: Diagnosis present

## 2022-01-22 DIAGNOSIS — I214 Non-ST elevation (NSTEMI) myocardial infarction: Secondary | ICD-10-CM

## 2022-01-22 DIAGNOSIS — I10 Essential (primary) hypertension: Secondary | ICD-10-CM

## 2022-01-22 DIAGNOSIS — N182 Chronic kidney disease, stage 2 (mild): Secondary | ICD-10-CM | POA: Diagnosis present

## 2022-01-22 DIAGNOSIS — I252 Old myocardial infarction: Secondary | ICD-10-CM | POA: Diagnosis not present

## 2022-01-22 DIAGNOSIS — I5033 Acute on chronic diastolic (congestive) heart failure: Secondary | ICD-10-CM

## 2022-01-22 DIAGNOSIS — I130 Hypertensive heart and chronic kidney disease with heart failure and stage 1 through stage 4 chronic kidney disease, or unspecified chronic kidney disease: Secondary | ICD-10-CM | POA: Diagnosis present

## 2022-01-22 DIAGNOSIS — I5043 Acute on chronic combined systolic (congestive) and diastolic (congestive) heart failure: Secondary | ICD-10-CM | POA: Diagnosis not present

## 2022-01-22 DIAGNOSIS — E7800 Pure hypercholesterolemia, unspecified: Secondary | ICD-10-CM

## 2022-01-22 DIAGNOSIS — M549 Dorsalgia, unspecified: Secondary | ICD-10-CM | POA: Diagnosis present

## 2022-01-22 DIAGNOSIS — G4733 Obstructive sleep apnea (adult) (pediatric): Secondary | ICD-10-CM

## 2022-01-22 DIAGNOSIS — K760 Fatty (change of) liver, not elsewhere classified: Secondary | ICD-10-CM | POA: Diagnosis present

## 2022-01-22 DIAGNOSIS — Z79899 Other long term (current) drug therapy: Secondary | ICD-10-CM | POA: Diagnosis not present

## 2022-01-22 DIAGNOSIS — Z7984 Long term (current) use of oral hypoglycemic drugs: Secondary | ICD-10-CM | POA: Diagnosis not present

## 2022-01-22 DIAGNOSIS — E876 Hypokalemia: Secondary | ICD-10-CM | POA: Diagnosis not present

## 2022-01-22 DIAGNOSIS — E1122 Type 2 diabetes mellitus with diabetic chronic kidney disease: Secondary | ICD-10-CM | POA: Diagnosis present

## 2022-01-22 DIAGNOSIS — K219 Gastro-esophageal reflux disease without esophagitis: Secondary | ICD-10-CM | POA: Diagnosis present

## 2022-01-22 DIAGNOSIS — E1140 Type 2 diabetes mellitus with diabetic neuropathy, unspecified: Secondary | ICD-10-CM | POA: Diagnosis present

## 2022-01-22 DIAGNOSIS — Z794 Long term (current) use of insulin: Secondary | ICD-10-CM | POA: Diagnosis not present

## 2022-01-22 DIAGNOSIS — M1990 Unspecified osteoarthritis, unspecified site: Secondary | ICD-10-CM | POA: Diagnosis present

## 2022-01-22 DIAGNOSIS — F32A Depression, unspecified: Secondary | ICD-10-CM | POA: Diagnosis present

## 2022-01-22 DIAGNOSIS — G8929 Other chronic pain: Secondary | ICD-10-CM | POA: Diagnosis present

## 2022-01-22 DIAGNOSIS — I5031 Acute diastolic (congestive) heart failure: Secondary | ICD-10-CM

## 2022-01-22 HISTORY — PX: LEFT HEART CATH AND CORONARY ANGIOGRAPHY: CATH118249

## 2022-01-22 HISTORY — DX: Non-ST elevation (NSTEMI) myocardial infarction: I21.4

## 2022-01-22 HISTORY — PX: CORONARY STENT INTERVENTION: CATH118234

## 2022-01-22 LAB — HIV ANTIBODY (ROUTINE TESTING W REFLEX): HIV Screen 4th Generation wRfx: NONREACTIVE

## 2022-01-22 LAB — LIPID PANEL
Cholesterol: 130 mg/dL (ref 0–200)
HDL: 37 mg/dL — ABNORMAL LOW (ref >40)
LDL Cholesterol: 26 mg/dL (ref 0–99)
Total CHOL/HDL Ratio: 3.5 RATIO
Triglycerides: 335 mg/dL — ABNORMAL HIGH (ref <150)
VLDL: 67 mg/dL — ABNORMAL HIGH (ref 0–40)

## 2022-01-22 LAB — BASIC METABOLIC PANEL
Anion gap: 12 (ref 5–15)
BUN: 17 mg/dL (ref 8–23)
CO2: 28 mmol/L (ref 22–32)
Calcium: 9.3 mg/dL (ref 8.9–10.3)
Chloride: 100 mmol/L (ref 98–111)
Creatinine, Ser: 1.04 mg/dL (ref 0.61–1.24)
GFR, Estimated: >60 mL/min (ref >60)
Glucose, Bld: 235 mg/dL — ABNORMAL HIGH (ref 70–99)
Potassium: 3.6 mmol/L (ref 3.5–5.1)
Sodium: 140 mmol/L (ref 135–145)

## 2022-01-22 LAB — CBC
HCT: 44.4 % (ref 39.0–52.0)
Hemoglobin: 14.3 g/dL (ref 13.0–17.0)
MCH: 31.6 pg (ref 26.0–34.0)
MCHC: 32.2 g/dL (ref 30.0–36.0)
MCV: 98.2 fL (ref 80.0–100.0)
Platelets: 214 10*3/uL (ref 150–400)
RBC: 4.52 MIL/uL (ref 4.22–5.81)
RDW: 14.2 % (ref 11.5–15.5)
WBC: 9.5 10*3/uL (ref 4.0–10.5)
nRBC: 0 % (ref 0.0–0.2)

## 2022-01-22 LAB — ECHOCARDIOGRAM COMPLETE
Area-P 1/2: 5.93 cm2
Height: 68 in
MV M vel: 1.04 m/s
MV Peak grad: 4.3 mmHg
Weight: 6400 oz

## 2022-01-22 LAB — HEPARIN LEVEL (UNFRACTIONATED): Heparin Unfractionated: <0.1 IU/mL — ABNORMAL LOW (ref 0.30–0.70)

## 2022-01-22 LAB — GLUCOSE, CAPILLARY
Glucose-Capillary: 234 mg/dL — ABNORMAL HIGH (ref 70–99)
Glucose-Capillary: 229 mg/dL — ABNORMAL HIGH (ref 70–99)
Glucose-Capillary: 274 mg/dL — ABNORMAL HIGH (ref 70–99)

## 2022-01-22 LAB — POCT ACTIVATED CLOTTING TIME
Activated Clotting Time: 281 seconds
Activated Clotting Time: 281 seconds

## 2022-01-22 LAB — CBG MONITORING, ED: Glucose-Capillary: 233 mg/dL — ABNORMAL HIGH (ref 70–99)

## 2022-01-22 LAB — HEMOGLOBIN A1C
Hgb A1c MFr Bld: 8.1 % — ABNORMAL HIGH (ref 4.8–5.6)
Mean Plasma Glucose: 185.77 mg/dL

## 2022-01-22 LAB — APTT: aPTT: 27 seconds (ref 24–36)

## 2022-01-22 LAB — TROPONIN I (HIGH SENSITIVITY): Troponin I (High Sensitivity): 6155 ng/L (ref <18)

## 2022-01-22 LAB — PROTIME-INR
INR: 1 (ref 0.8–1.2)
Prothrombin Time: 13.5 seconds (ref 11.4–15.2)

## 2022-01-22 LAB — MAGNESIUM: Magnesium: 1.7 mg/dL (ref 1.7–2.4)

## 2022-01-22 SURGERY — LEFT HEART CATH AND CORONARY ANGIOGRAPHY
Anesthesia: LOCAL

## 2022-01-22 MED ORDER — SODIUM CHLORIDE 0.9 % IV SOLN: 250.0000 mL | INTRAVENOUS | Status: DC | PRN

## 2022-01-22 MED ORDER — HEPARIN (PORCINE) IN NACL 1000-0.9 UT/500ML-% IV SOLN
INTRAVENOUS | Status: AC
  Filled 2022-01-22: qty 500

## 2022-01-22 MED ORDER — VERAPAMIL HCL 2.5 MG/ML IV SOLN
INTRAVENOUS | Status: AC
  Filled 2022-01-22: qty 2

## 2022-01-22 MED ORDER — INSULIN ASPART 100 UNIT/ML IJ SOLN
0.0000 [IU] | Freq: Three times a day (TID) | INTRAMUSCULAR | Status: DC
  Administered 2022-01-22 (×2): 5 [IU] via SUBCUTANEOUS
  Administered 2022-01-23: 8 [IU] via SUBCUTANEOUS
  Administered 2022-01-23: 5 [IU] via SUBCUTANEOUS
  Administered 2022-01-23: 8 [IU] via SUBCUTANEOUS
  Administered 2022-01-24: 5 [IU] via SUBCUTANEOUS

## 2022-01-22 MED ORDER — TIROFIBAN HCL IN NACL 5-0.9 MG/100ML-% IV SOLN
INTRAVENOUS | Status: AC
  Filled 2022-01-22: qty 100

## 2022-01-22 MED ORDER — LABETALOL HCL 5 MG/ML IV SOLN: 10.0000 mg | INTRAVENOUS | Status: AC | PRN

## 2022-01-22 MED ORDER — FUROSEMIDE 10 MG/ML IJ SOLN
INTRAMUSCULAR | Status: AC
  Filled 2022-01-22: qty 4

## 2022-01-22 MED ORDER — LIDOCAINE HCL (PF) 1 % IJ SOLN
INTRAMUSCULAR | Status: DC | PRN
  Administered 2022-01-22: 2 mL

## 2022-01-22 MED ORDER — HEPARIN SODIUM (PORCINE) 1000 UNIT/ML IJ SOLN
INTRAMUSCULAR | Status: DC | PRN
  Administered 2022-01-22: 4000 [IU] via INTRAVENOUS
  Administered 2022-01-22 (×2): 8000 [IU] via INTRAVENOUS

## 2022-01-22 MED ORDER — SODIUM CHLORIDE 0.9 % IV SOLN: INTRAVENOUS | Status: AC

## 2022-01-22 MED ORDER — ONDANSETRON HCL 4 MG/2ML IJ SOLN: 4.0000 mg | Freq: Four times a day (QID) | INTRAMUSCULAR | Status: DC | PRN

## 2022-01-22 MED ORDER — TRAZODONE HCL 100 MG PO TABS
300.0000 mg | ORAL_TABLET | Freq: Every day | ORAL | Status: DC
  Administered 2022-01-22 – 2022-01-23 (×2): 300 mg via ORAL
  Filled 2022-01-22 (×2): qty 3

## 2022-01-22 MED ORDER — ACETAMINOPHEN 325 MG PO TABS
650.0000 mg | ORAL_TABLET | ORAL | Status: DC | PRN
  Administered 2022-01-22 (×2): 650 mg via ORAL
  Filled 2022-01-22 (×2): qty 2

## 2022-01-22 MED ORDER — TICAGRELOR 90 MG PO TABS
90.0000 mg | ORAL_TABLET | Freq: Two times a day (BID) | ORAL | Status: DC
  Administered 2022-01-22 – 2022-01-24 (×4): 90 mg via ORAL
  Filled 2022-01-22 (×4): qty 1

## 2022-01-22 MED ORDER — FUROSEMIDE 10 MG/ML IJ SOLN
40.0000 mg | Freq: Two times a day (BID) | INTRAMUSCULAR | Status: DC
  Administered 2022-01-22 – 2022-01-24 (×4): 40 mg via INTRAVENOUS
  Filled 2022-01-22 (×4): qty 4

## 2022-01-22 MED ORDER — HEPARIN BOLUS VIA INFUSION
3000.0000 [IU] | Freq: Once | INTRAVENOUS | Status: AC
  Administered 2022-01-22: 3000 [IU] via INTRAVENOUS
  Filled 2022-01-22: qty 3000

## 2022-01-22 MED ORDER — SODIUM CHLORIDE 0.9% FLUSH
3.0000 mL | Freq: Two times a day (BID) | INTRAVENOUS | Status: DC
  Administered 2022-01-23 – 2022-01-24 (×2): 3 mL via INTRAVENOUS

## 2022-01-22 MED ORDER — DULOXETINE HCL 60 MG PO CPEP
60.0000 mg | ORAL_CAPSULE | Freq: Every day | ORAL | Status: DC
  Administered 2022-01-22 – 2022-01-24 (×3): 60 mg via ORAL
  Filled 2022-01-22: qty 2
  Filled 2022-01-22 (×2): qty 1

## 2022-01-22 MED ORDER — HYDROMORPHONE HCL 1 MG/ML IJ SOLN
0.2500 mg | Freq: Once | INTRAMUSCULAR | Status: AC
  Administered 2022-01-22: 0.25 mg via INTRAVENOUS
  Filled 2022-01-22: qty 1

## 2022-01-22 MED ORDER — SODIUM CHLORIDE 0.9% FLUSH: 3.0000 mL | INTRAVENOUS | Status: DC | PRN

## 2022-01-22 MED ORDER — TIROFIBAN (AGGRASTAT) BOLUS VIA INFUSION
INTRAVENOUS | Status: DC | PRN
  Administered 2022-01-22: 3825 ug via INTRAVENOUS

## 2022-01-22 MED ORDER — MIDAZOLAM HCL 2 MG/2ML IJ SOLN
INTRAMUSCULAR | Status: AC
  Filled 2022-01-22: qty 2

## 2022-01-22 MED ORDER — ICOSAPENT ETHYL 1 G PO CAPS
2.0000 g | ORAL_CAPSULE | Freq: Two times a day (BID) | ORAL | Status: DC
  Administered 2022-01-22 – 2022-01-24 (×4): 2 g via ORAL
  Filled 2022-01-22 (×6): qty 2

## 2022-01-22 MED ORDER — TICAGRELOR 90 MG PO TABS
ORAL_TABLET | ORAL | Status: AC
  Filled 2022-01-22: qty 2

## 2022-01-22 MED ORDER — CARVEDILOL 12.5 MG PO TABS
12.5000 mg | ORAL_TABLET | Freq: Two times a day (BID) | ORAL | Status: DC
  Administered 2022-01-22 – 2022-01-24 (×4): 12.5 mg via ORAL
  Filled 2022-01-22 (×4): qty 1

## 2022-01-22 MED ORDER — MORPHINE SULFATE (PF) 2 MG/ML IV SOLN: 2.0000 mg | Freq: Three times a day (TID) | INTRAVENOUS | Status: DC | PRN

## 2022-01-22 MED ORDER — SODIUM CHLORIDE 0.9% FLUSH
3.0000 mL | Freq: Two times a day (BID) | INTRAVENOUS | Status: DC
  Administered 2022-01-22: 3 mL via INTRAVENOUS

## 2022-01-22 MED ORDER — MIDAZOLAM HCL 2 MG/2ML IJ SOLN
INTRAMUSCULAR | Status: DC | PRN
  Administered 2022-01-22: 1 mg via INTRAVENOUS

## 2022-01-22 MED ORDER — HEPARIN (PORCINE) 25000 UT/250ML-% IV SOLN
1900.0000 [IU]/h | INTRAVENOUS | Status: DC
  Administered 2022-01-22: 1600 [IU]/h via INTRAVENOUS

## 2022-01-22 MED ORDER — PANTOPRAZOLE SODIUM 40 MG PO TBEC
40.0000 mg | DELAYED_RELEASE_TABLET | Freq: Every day | ORAL | Status: DC
  Administered 2022-01-22 – 2022-01-24 (×3): 40 mg via ORAL
  Filled 2022-01-22 (×3): qty 1

## 2022-01-22 MED ORDER — FENTANYL CITRATE (PF) 100 MCG/2ML IJ SOLN
INTRAMUSCULAR | Status: AC
  Filled 2022-01-22: qty 2

## 2022-01-22 MED ORDER — SODIUM CHLORIDE 0.9 % WEIGHT BASED INFUSION: 1.0000 mL/kg/h | INTRAVENOUS | Status: DC

## 2022-01-22 MED ORDER — PERFLUTREN LIPID MICROSPHERE
1.0000 mL | INTRAVENOUS | Status: AC | PRN
  Administered 2022-01-22: 4 mL via INTRAVENOUS

## 2022-01-22 MED ORDER — IOHEXOL 350 MG/ML SOLN
INTRAVENOUS | Status: DC | PRN
  Administered 2022-01-22: 125 mL

## 2022-01-22 MED ORDER — PREGABALIN 75 MG PO CAPS
150.0000 mg | ORAL_CAPSULE | Freq: Three times a day (TID) | ORAL | Status: DC
  Administered 2022-01-22 – 2022-01-24 (×7): 150 mg via ORAL
  Filled 2022-01-22 (×7): qty 2

## 2022-01-22 MED ORDER — HEPARIN (PORCINE) IN NACL 1000-0.9 UT/500ML-% IV SOLN
INTRAVENOUS | Status: DC | PRN
  Administered 2022-01-22 (×2): 500 mL

## 2022-01-22 MED ORDER — VERAPAMIL HCL 2.5 MG/ML IV SOLN
INTRAVENOUS | Status: DC | PRN
  Administered 2022-01-22: 10 mL via INTRA_ARTERIAL

## 2022-01-22 MED ORDER — ASPIRIN 81 MG PO TBEC
81.0000 mg | DELAYED_RELEASE_TABLET | Freq: Every day | ORAL | Status: DC
  Administered 2022-01-23 – 2022-01-24 (×2): 81 mg via ORAL
  Filled 2022-01-22 (×2): qty 1

## 2022-01-22 MED ORDER — SODIUM CHLORIDE 0.9 % IV SOLN: INTRAVENOUS | Status: DC

## 2022-01-22 MED ORDER — ATORVASTATIN CALCIUM 40 MG PO TABS: 40.0000 mg | ORAL_TABLET | Freq: Every day | ORAL | Status: DC

## 2022-01-22 MED ORDER — INSULIN ASPART 100 UNIT/ML IJ SOLN
0.0000 [IU] | Freq: Every day | INTRAMUSCULAR | Status: DC
  Administered 2022-01-22 – 2022-01-23 (×2): 2 [IU] via SUBCUTANEOUS

## 2022-01-22 MED ORDER — SODIUM CHLORIDE 0.9 % WEIGHT BASED INFUSION: 3.0000 mL/kg/h | INTRAVENOUS | Status: DC

## 2022-01-22 MED ORDER — FUROSEMIDE 10 MG/ML IJ SOLN
120.0000 mg | Freq: Once | INTRAVENOUS | Status: AC
  Administered 2022-01-22: 120 mg via INTRAVENOUS
  Filled 2022-01-22: qty 10

## 2022-01-22 MED ORDER — ATORVASTATIN CALCIUM 80 MG PO TABS
80.0000 mg | ORAL_TABLET | Freq: Every day | ORAL | Status: DC
  Administered 2022-01-22 – 2022-01-24 (×3): 80 mg via ORAL
  Filled 2022-01-22: qty 2
  Filled 2022-01-22 (×2): qty 1

## 2022-01-22 MED ORDER — OXYCODONE HCL 5 MG PO TABS
5.0000 mg | ORAL_TABLET | Freq: Four times a day (QID) | ORAL | Status: DC | PRN
  Administered 2022-01-22: 5 mg via ORAL
  Filled 2022-01-22: qty 1

## 2022-01-22 MED ORDER — FENTANYL CITRATE (PF) 100 MCG/2ML IJ SOLN
INTRAMUSCULAR | Status: DC | PRN
  Administered 2022-01-22 (×3): 50 ug via INTRAVENOUS

## 2022-01-22 MED ORDER — TICAGRELOR 90 MG PO TABS
ORAL_TABLET | ORAL | Status: DC | PRN
  Administered 2022-01-22: 180 mg via ORAL

## 2022-01-22 MED ORDER — METOPROLOL TARTRATE 5 MG/5ML IV SOLN
5.0000 mg | Freq: Once | INTRAVENOUS | Status: AC
  Administered 2022-01-22: 5 mg via INTRAVENOUS
  Filled 2022-01-22: qty 5

## 2022-01-22 MED ORDER — HYDRALAZINE HCL 20 MG/ML IJ SOLN: 10.0000 mg | INTRAMUSCULAR | Status: AC | PRN

## 2022-01-22 MED ORDER — HEPARIN SODIUM (PORCINE) 1000 UNIT/ML IJ SOLN
INTRAMUSCULAR | Status: AC
  Filled 2022-01-22: qty 10

## 2022-01-22 MED ORDER — LIDOCAINE HCL (PF) 1 % IJ SOLN
INTRAMUSCULAR | Status: AC
  Filled 2022-01-22: qty 30

## 2022-01-22 MED ORDER — AMLODIPINE BESYLATE 5 MG PO TABS
5.0000 mg | ORAL_TABLET | Freq: Every day | ORAL | Status: DC
  Administered 2022-01-22 – 2022-01-24 (×3): 5 mg via ORAL
  Filled 2022-01-22 (×3): qty 1

## 2022-01-22 MED ORDER — ORAL CARE MOUTH RINSE: 15.0000 mL | OROMUCOSAL | Status: DC | PRN

## 2022-01-22 SURGICAL SUPPLY — 22 items
BALL SAPPHIRE NC24 3.25X8 (BALLOONS) ×1
BALLN MINITREK OTW 2.0X12 (BALLOONS) ×1
BALLOON MINITREK OTW 2.0X12 (BALLOONS) IMPLANT
BALLOON SAPPHIRE NC24 3.25X8 (BALLOONS) IMPLANT
BAND ZEPHYR COMPRESS 30 LONG (HEMOSTASIS) IMPLANT
CATH 5FR JL3.5 JR4 ANG PIG MP (CATHETERS) IMPLANT
CATH VISTA GUIDE 6FR XBLAD3.5 (CATHETERS) IMPLANT
GLIDESHEATH SLEND SS 6F .021 (SHEATH) IMPLANT
GUIDEWIRE INQWIRE 1.5J.035X260 (WIRE) IMPLANT
INQWIRE 1.5J .035X260CM (WIRE) ×1
KIT ENCORE 26 ADVANTAGE (KITS) IMPLANT
KIT ESSENTIALS PG (KITS) IMPLANT
KIT HEART LEFT (KITS) ×1 IMPLANT
PACK CARDIAC CATHETERIZATION (CUSTOM PROCEDURE TRAY) ×1 IMPLANT
SHEATH PROBE COVER 6X72 (BAG) IMPLANT
STENT SYNERGY XD 3.0X16 (Permanent Stent) IMPLANT
SYNERGY XD 3.0X16 (Permanent Stent) ×1 IMPLANT
TRANSDUCER W/STOPCOCK (MISCELLANEOUS) ×1 IMPLANT
TUBING CIL FLEX 10 FLL-RA (TUBING) ×1 IMPLANT
WIRE COUGAR XT STRL 190CM (WIRE) IMPLANT
WIRE HI TORQ WHISPER MS 190CM (WIRE) IMPLANT
WIRE HI TORQ WHISPER MS 300CM (WIRE) IMPLANT

## 2022-01-22 NOTE — Progress Notes (Signed)
Removed TR band from right radial. Site is a level 0. Placed gauze and Tegaderm to site. Educated to leave in place 24 hrs. Pt verbalized understanding.

## 2022-01-22 NOTE — Progress Notes (Signed)
[facility] for heparin Indication:  NSTEMI  Allergies  Allergen Reactions   Chlorzoxazone Other (See Comments)    Other reaction(s): Angioedema (ALLERGY/intolerance)   Piroxicam Shortness Of Breath and Swelling    Tongue   Latex Other (See Comments) and Rash    Rash and itching Other reaction(s): Other (See Comments) Burning Feeling   Adhesive [Tape] Other (See Comments)    welts Unknown paper tape ok to use     Patient Measurements: Height: '5\' 8"'$  (172.7 cm) Weight: (!) 181.4 kg (400 lb) IBW/kg (Calculated) : 68.4 Heparin Dosing Weight: 115kg  Vital Signs: Temp: 98 F (36.7 C) (11/09 1055) Temp Source: Oral (11/09 1055) BP: 166/88 (11/09 1100) Pulse Rate: 90 (11/09 1100)  Labs: Recent Labs    01/21/22 1651 01/22/22 0030 01/22/22 0350 01/22/22 0900  HGB 14.9  --  14.3  --   HCT 46.3  --  44.4  --   PLT 200  --  214  --   APTT  --  27  --   --   LABPROT  --  13.5  --   --   INR  --  1.0  --   --   HEPARINUNFRC  --   --   --  <0.10*  CREATININE 1.14  --  1.04  --   TROPONINIHS 2,264* 6,155*  --   --      Estimated Creatinine Clearance: 106.2 mL/min (by C-G formula based on SCr of 1.04 mg/dL).   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) 07/30/2015   Pneumonia    not at present time   Renal insufficiency    Shortness of breath    Sleep apnea, obstructive    does where bipap. 06-04-14 "doesn't use much now, no mask/tubing now.    Assessment: 71yo male c/o CP radiating to RUE/neck associated w/ some SOB. No anticoagulation prior to admission. Pharmacy consulted to dose heparin.  Heparin level undetectable on 1600 units/hr. No signs of bleeding with CBC stable.  Goal of Therapy:  Heparin level 0.3-0.7 units/ml Monitor platelets by anticoagulation protocol: Yes   Plan:  Bolus heparin 3000 units IV once Increase heparin to 1900 units/hr Check 8hr heparin level at 2000 Monitor heparin levels and CBC. F/u plans for [facility] after cath  Erskine Speed, PharmD Clinical Pharmacist 01/22/2022,11:47 AM

## 2022-01-22 NOTE — Progress Notes (Signed)
Heart Failure Navigator Progress Note  Following this hospitalization to assess for HV TOC readiness.   ECHO (low normal) Left Heart Cath planned 01/22/22  Earnestine Leys, BSN, RN Heart Failure Nurse Navigator Secure Chat Only

## 2022-01-22 NOTE — Progress Notes (Signed)
Echocardiogram 2D Echocardiogram has been performed.  Phillip Maldonado 01/22/2022, 11:05 AM

## 2022-01-22 NOTE — ED Notes (Signed)
Consent for cath obtained.

## 2022-01-22 NOTE — Interval H&P Note (Signed)
History and Physical Interval Note:  01/22/2022 2:07 PM  Phillip Maldonado  has presented today for surgery, with the diagnosis of nstemi.  The various methods of treatment have been discussed with the patient and family. After consideration of risks, benefits and other options for treatment, the patient has consented to  Procedure(s): LEFT HEART CATH AND CORONARY ANGIOGRAPHY (N/A) as a surgical intervention.  The patient's history has been reviewed, patient examined, no change in status, stable for surgery.  I have reviewed the patient's chart and labs.  Questions were answered to the patient's satisfaction.    Cath Lab Visit (complete for each Cath Lab visit)  Clinical Evaluation Leading to the Procedure:   ACS: Yes.    Non-ACS:    Anginal Classification: CCS III  Anti-ischemic medical therapy: Maximal Therapy (2 or more classes of medications)  Non-Invasive Test Results: No non-invasive testing performed  Prior CABG: No previous CABG        Phillip Maldonado

## 2022-01-22 NOTE — Progress Notes (Signed)
Patient transferred from cath lab at 1710hrs. C/o feeling SOB, on [place] 6L oxygen and sating 95%.  Patient anxious about wife not being able to be reached, wife contacted and updated on patient's condition.  Right radial site with level zero.  Reviewed post cath instructions with patient.  IV lasix '40mg'$  ordered and given.

## 2022-01-22 NOTE — H&P (Signed)
Cardiology Admission History and Physical   Patient ID: Phillip Maldonado MRN: [id number]; DOB: [birth year]   Admission date: 01/21/2022  PCP:  Glennie Hawk, [facility] Prov[facility] Cardiologist:  None        Chief Complaint: Chest pain  Patient Profile:   Phillip Maldonado is a 71 y.o. male with HFpEF, type 2 diabetes, hypertension, hyperlipidemia, obesity who is being seen 01/22/2022 for the evaluation of chest pain.  History of Present Illness:   Phillip Maldonado reports waking up at 4:30 AM yesterday morning with acute onset substernal deep chest discomfort that radiated to the left side of the chest as well as the right jaw.  He reports sitting up from bed and waiting for the chest pain to subside but it persisted for roughly 2 hours.  After roughly 2 hours the chest pain began to subside and he was feeling slightly better.  Later on in the day his chest pain returned and he ultimately decided to present to the emergency department for evaluation.  At the time of my evaluation his chest pain was 8 out of 10.  He reports no history of acute coronary syndrome similar chest pain.  He reports a few weeks of worsening lower extremity edema, orthopnea, and dyspnea with exertion.  He reports taking his home diuretic although did miss his dose on Wednesday.   Past Medical History:  Diagnosis Date   Anemia    Angina    Anxiety    Arthritis    Atrial fibrillation with RVR ([place]) 01/24/2013   Blood transfusion    last 10' 14- "GI bleed"   CHF (congestive heart failure) (HCC)    CKD (chronic kidney disease) stage 3, GFR 30-59 ml/min ([place]) 01/08/2016   Depression    Diabetes mellitus ([place]) 09/18/2011   Diverticulosis    DJD (degenerative joint disease)    DM type 2, uncontrolled, 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   Edema extremities    bilateral lower extremities-"weepy areas due to fluid retention"   Fatty liver    Fundic gland polyps of stomach, benign     Headache(784.0)    Hepatitis B    History of alcohol abuse    last drink in 1993   Hypertension    Neuromuscular disorder ([place])    Neuropathy, median nerve 09/11/2014   Bilateral   NSTEMI (non-ST elevated myocardial infarction) ([place]) 01/22/2022   Obesity    Obesity, morbid ([place]) 07/30/2015   Pneumonia    not at present time   Renal insufficiency    Shortness of breath    Sleep apnea, obstructive    does where bipap. 06-04-14 "doesn't use much now, no mask/tubing now.    Past Surgical History:  Procedure Laterality Date   ABDOMINAL SURGERY     [phone]   BACK SURGERY     x 8 -,multiple fusions(cervical to lumbar)   BILIARY STENT PLACEMENT  02/07/2019   Procedure: BILIARY STENT PLACEMENT;  Surgeon: Ladene Artist, MD;  Location: [facility] ENDOSCOPY;  Service: Endoscopy;;   CHOLECYSTECTOMY N/A 02/02/2019   Procedure: attempted LAPAROSCOPIC CHOLECYSTECTOMY;  Surgeon: Coralie Keens, MD;  Location: [place];  Service: General;  Laterality: N/A;   CHOLECYSTECTOMY  02/02/2019   Procedure: Cholecystectomy;  Surgeon: Coralie Keens, MD;  Location: [place];  Service: General;;   COLONOSCOPY     COLONOSCOPY WITH PROPOFOL N/A 06/14/2014   Procedure: COLONOSCOPY WITH PROPOFOL;  Surgeon: Gatha Mayer, MD;  Location: [facility] ENDOSCOPY;  Service: Endoscopy;  Laterality: N/A;   DIAGNOSTIC LAPAROSCOPY     ERCP N/A 02/07/2019   Procedure: ENDOSCOPIC RETROGRADE CHOLANGIOPANCREATOGRAPHY (ERCP);  Surgeon: Ladene Artist, MD;  Location: [facility] ENDOSCOPY;  Service: Endoscopy;  Laterality: N/A;   ERCP N/A 04/07/2019   Procedure: ENDOSCOPIC RETROGRADE CHOLANGIOPANCREATOGRAPHY (ERCP);  Surgeon: Gatha Mayer, MD;  Location: [facility];  Service: Endoscopy;  Laterality: N/A;   ESOPHAGOGASTRODUODENOSCOPY N/A 01/03/2013   Procedure: ESOPHAGOGASTRODUODENOSCOPY (EGD);  Surgeon: Arta Silence, MD;  Location: [facility] ORS;  Service: Endoscopy;  Laterality: N/A;   ESOPHAGOGASTRODUODENOSCOPY N/A 01/03/2013   Procedure:  ESOPHAGOGASTRODUODENOSCOPY (EGD);  Surgeon: Arta Silence, MD;  Location: [facility] ENDOSCOPY;  Service: Endoscopy;  Laterality: N/A;   ESOPHAGOGASTRODUODENOSCOPY N/A 01/23/2013   Procedure: ESOPHAGOGASTRODUODENOSCOPY (EGD);  Surgeon: Lear Ng, MD;  Location: [facility] ENDOSCOPY;  Service: Endoscopy;  Laterality: N/A;   ESOPHAGOGASTRODUODENOSCOPY Left 01/27/2013   Procedure: ESOPHAGOGASTRODUODENOSCOPY (EGD);  Surgeon: Lear Ng, MD;  Location: [place];  Service: Endoscopy;  Laterality: Left;   ESOPHAGOGASTRODUODENOSCOPY N/A 06/14/2014   Procedure: ESOPHAGOGASTRODUODENOSCOPY (EGD);  Surgeon: Gatha Mayer, MD;  Location: [facility];  Service: Endoscopy;  Laterality: N/A;   ESOPHAGOSCOPY N/A 01/01/2013   Procedure: ESOPHAGOSCOPY;  Surgeon: Jeryl Columbia, MD;  Location: [place];  Service: Endoscopy;  Laterality: N/A;   GASTRIC BYPASS  1977   Reversed in 1992 and revision [place] N/A 04/07/2019   Procedure: GASTROINTESTINAL STENT REMOVAL;  Surgeon: Gatha Mayer, MD;  Location: [facility] ENDOSCOPY;  Service: Endoscopy;  Laterality: N/A;   HYDROCELE EXCISION  1996   x 2    KNEE SURGERY Left 2004     Medications Prior to Admission: Prior to Admission medications   Medication Sig Start Date End Date Taking? Authorizing Provider  carvedilol (COREG) 12.5 MG tablet Take 12.5 mg by mouth 2 (two) times daily.   Yes [provider]  DULoxetine (CYMBALTA) 60 MG capsule Take 1 capsule (60 mg total) by mouth daily. 02/11/19 01/22/23 Yes Harold Hedge, MD  insulin glargine (LANTUS) 100 UNIT/ML injection Inject 0.2 mLs (20 Units total) into the skin daily at 10 pm. Patient taking differently: Inject 30 Units into the skin at bedtime. 02/10/19  Yes Harold Hedge, MD  VICTOZA 18 MG/3ML SOPN Inject 1.8 mg into the skin daily.   Yes [provider]  acetaminophen (TYLENOL) 500 MG tablet Take 2 tablets (1,000 mg total) by mouth every 8 (eight) hours. Patient taking  differently: Take 1,000 mg by mouth 2 (two) times daily.  02/10/19   Harold Hedge, MD  amLODipine (NORVASC) 5 MG tablet Take 5 mg by mouth daily. 09/19/18   [provider]  atorvastatin (LIPITOR) 40 MG tablet Take 1 tablet (40 mg total) by mouth daily. 02/22/17   [name], NP  cholecalciferol (VITAMIN D3) 25 MCG (1000 UT) tablet Take 1,000 Units by mouth daily.    [provider]  docusate sodium (COLACE) 100 MG capsule Take 100 mg by mouth 2 (two) times daily as needed for mild constipation.    [provider]  glucose blood (ACCU-CHEK AVIVA PLUS) test strip 1 each by Other route See admin instructions. Check blood sugar twice daily    [provider]  metFORMIN (GLUCOPHAGE) 1000 MG tablet Take 1,000 mg by mouth 2 (two) times daily.    [provider]  Multiple Vitamin (MULITIVITAMIN WITH MINERALS) TABS Take 1 tablet by mouth daily.    [provider]  pantoprazole (PROTONIX) 40 MG tablet  Take 1 tablet (40 mg total) by mouth daily before breakfast. 04/07/19   Gatha Mayer, MD  pregabalin (LYRICA) 150 MG capsule Take 150 mg by mouth 3 (three) times daily.    [provider]  Thiamine HCl (VITAMIN B1 PO) Take 1 tablet by mouth daily.    [provider]  torsemide (DEMADEX) 10 MG tablet Take 5 tablets (50 mg total) by mouth every Monday, Wednesday, and Friday. 02/13/19 03/30/20  Harold Hedge, MD  torsemide (DEMADEX) 100 MG tablet Take 100 mg by mouth daily. 04/02/19   [provider]  traZODone (DESYREL) 300 MG tablet Take 1 tablet (300 mg total) by mouth at bedtime. 02/10/19 01/22/23  Harold Hedge, MD  Wheat Dextrin (BENEFIBER PO) Take 12.6 g by mouth daily.    [provider]     Allergies:    Allergies  Allergen Reactions   Chlorzoxazone Other (See Comments)    Other reaction(s): Angioedema (ALLERGY/intolerance)   Piroxicam Shortness Of Breath and Swelling    Tongue   Latex Other (See  Comments) and Rash    Rash and itching Other reaction(s): Other (See Comments) Burning Feeling   Adhesive [Tape] Other (See Comments)    welts Unknown paper tape ok to use     Social History:   Social History   Socioeconomic History   Marital status: Married    Spouse name: Not on file   Number of children: 6   Years of education: Not on file   Highest education level: Not on file  Occupational History   Occupation: DISABLED  Tobacco Use   Smoking status: Never   Smokeless tobacco: Never  Vaping Use   Vaping Use: Never used  Substance and Sexual Activity   Alcohol use: No    Alcohol/week: 0.0 standard drinks of alcohol   Drug use: No   Sexual activity: Not Currently  Other Topics Concern   Not on file  Social History Narrative   Patient is divorced, he has a total of 6 children 2 of which are deceased. He is a retired Conservation officer, historic buildings. History of alcoholism. No alcohol now. 2 caffeinated beverages daily.    Married for 3rd time   Social Determinants of Radio broadcast assistant Strain: Not on file  Food Insecurity: Not on file  Transportation Needs: Not on file  Physical Activity: Not on file  Stress: Not on file  Social Connections: Not on file  Intimate Partner Violence: Not on file    Family History:   The patient's family history includes Arthritis in his mother, sister, and sister; COPD in his father; Cancer in his maternal grandmother; Depression in his son and son; Diabetes in his daughter; Heart disease in his father, maternal uncle, maternal uncle, and paternal uncle; High blood pressure in his sister, sister, and son; Hypertension in his father; Osteoporosis in his mother; Skin cancer in his father; Stroke in his father.    ROS:  Please see the history of present illness.  All other ROS reviewed and negative.     Physical Exam/Data:   Vitals:   01/21/22 1640 01/21/22 2107 01/21/22 2357 01/22/22 0032  BP:  (!) 143/112 (!) 182/100 (!) 195/86   Pulse:  82 94 95  Resp:  18 (!) 25 (!) 26  Temp:  98.4 F (36.9 C)    TempSrc:  Oral    SpO2:  95% 94% 98%  Weight: (!) 181.4 kg     Height: '5\' 8"'$  (1.727  m)      No intake or output data in the 24 hours ending 01/22/22 0053    01/21/2022    4:40 PM 04/07/2019   11:46 AM 03/31/2019    4:06 PM  Last 3 Weights  Weight (lbs) 400 lb 325 lb 346 lb 4 oz  Weight (kg) 181.439 kg 147.419 kg 157.058 kg     Body mass index is 60.82 kg/m.  General:  Well nourished, well developed, in no acute distress HEENT: normal Neck: Elevated JVD Vascular: No carotid bruits; Distal pulses 2+ bilaterally   Cardiac:  normal S1, S2; RRR; no murmur Lungs:  clear to auscultation bilaterally, no wheezing, rhonchi or rales  Abd: soft, nontender, no hepatomegaly  Ext: 3+ edema bilateral lower EXTR to knees Musculoskeletal:  No deformities, BUE and BLE strength normal and equal Skin: warm and dry  Neuro:  CNs 2-12 intact, no focal abnormalities noted Psych:  Normal affect    EKG:  The ECG that was done and was personally reviewed and demonstrates sinus rhythm, first-degree AV block, right bundle branch block, left anterior fascicular block  Relevant CV Studies: Last ECHO with preserved EF and no significant valvular abnormalities  Laboratory Data:  High Sensitivity Troponin:   Recent Labs  Lab 01/21/22 1651  TROPONINIHS 2,264*      Chemistry Recent Labs  Lab 01/21/22 1651  NA 142  K 3.9  CL 100  CO2 28  GLUCOSE 174*  BUN 17  CREATININE 1.14  CALCIUM 9.6  GFRNONAA >60  ANIONGAP 14    No results for input(s): "PROT", "ALBUMIN", "AST", "ALT", "ALKPHOS", "BILITOT" in the last 168 hours. Lipids No results for input(s): "CHOL", "TRIG", "HDL", "LABVLDL", "LDLCALC", "CHOLHDL" in the last 168 hours. Hematology Recent Labs  Lab 01/21/22 1651  WBC 8.7  RBC 4.72  HGB 14.9  HCT 46.3  MCV 98.1  MCH 31.6  MCHC 32.2  RDW 14.2  PLT 200   Thyroid No results for input(s): "TSH", "FREET4" in  the last 168 hours. BNP Recent Labs  Lab 01/21/22 1658  BNP 49.4    DDimer No results for input(s): "DDIMER" in the last 168 hours.   Radiology/Studies:  DG Chest 1 View  Result Date: 01/21/2022 CLINICAL DATA:  Chest pain EXAM: CHEST  1 VIEW COMPARISON:  01/25/2019 FINDINGS: The patient's head and chin obscure parts of the apices. Streaky atelectasis or scarring left base. No focal consolidation or effusion. Stable cardiomediastinal silhouette with aortic atherosclerosis. No pneumothorax. Multiple clips in the left upper quadrant IMPRESSION: No active disease. Streaky atelectasis or scarring at the left base. Electronically Signed   By: Donavan Foil M.D.   On: 01/21/2022 17:14     Assessment and Plan:   NSTEMI Symptoms of acute onset chest pain with markedly elevated troponin and ECG with ischemic changes. -Aspirin 81 daily -Nitro gtt -titrate up until chest pain-free -5 mg IV metoprolol to decrease rate-pressure product acutely -Continue home carvedilol -Continue home atorvastatin -Follow-up lipid panel, goal LDL less than 55 -Coronary angiogram in AM -Complete echocardiogram -ECG with any acute change  Acute on chronic HFpEF (diastolic heart failure) Reports progressive volume overload symptoms over the past few weeks with increased lower extremity edema and JVD on exam. -120 IV Lasix for acute decongestion and to allow him to lay flat for angiogram -Transition to home torsemide when better compensated -Start SGLT2i tomorrow -Initiate spironolactone prior to discharge  T2DM -Holding home metformin -Holding home insulin while patient is n.p.o.  Hypertension -Continue  Amlodipine, Coreg  Hyperlipidemia -See above  Class III obesity -Consider referral for bariatric surgery  7.   Depression -Continue duloxetine   Risk Assessment/Risk Scores:    TIMI Risk Score for Unstable Angina or Non-ST Elevation MI:   The patient's TIMI risk score is 5, which indicates a 26%  risk of all cause mortality, new or recurrent myocardial infarction or need for urgent revascularization in the next 14 days.  New York Heart Association (NYHA) Functional Class NYHA Class II  CHA2DS2-VASc Score =   4  This indicates a 4 % annual risk of stroke. The patient's score is based upon: CHF, HTN, Age 10, T2DM        Severity of Illness: The appropriate patient status for this patient is OBSERVATION. Observation status is judged to be reasonable and necessary in order to provide the required intensity of service to ensure the patient's safety. The patient's presenting symptoms, physical exam findings, and initial radiographic and laboratory data in the context of their medical condition is felt to place them at decreased risk for further clinical deterioration. Furthermore, it is anticipated that the patient will be medically stable for discharge from the hospital within 2 midnights of admission.    For questions or updates, please contact [place] Please consult [email or web address] for contact info under     Signed, Loren Racer, MD  01/22/2022 12:53 AM

## 2022-01-22 NOTE — H&P (View-Only) (Signed)
Rounding Note    Patient Name: Phillip Maldonado Date of Encounter: 01/22/2022  [place] Cardiologist: None   Subjective   No recurrent chest pain.  Breathing has improved.  Reports he has titanium rod from his hip to neck thus unable to lay flat completely without discomfort.  Inpatient Medications    Scheduled Meds:  amLODipine  5 mg Oral Daily   [START ON 01/23/2022] aspirin EC  81 mg Oral Daily   atorvastatin  40 mg Oral Daily   carvedilol  12.5 mg Oral BID   DULoxetine  60 mg Oral Daily   pantoprazole  40 mg Oral QAC breakfast   pregabalin  150 mg Oral TID   trazodone  300 mg Oral QHS   Continuous Infusions:  sodium chloride 10 mL/hr at 01/22/22 0033   heparin 1,600 Units/hr (01/22/22 0248)   nitroGLYCERIN 50 mcg/min (01/22/22 0312)   PRN Meds: acetaminophen, ondansetron (ZOFRAN) IV   Vital Signs    Vitals:   01/22/22 0245 01/22/22 0250 01/22/22 0607 01/22/22 0645  BP: (!) 188/137  114/79   Pulse: 83 81 85   Resp: (!) 23 (!) 24 (!) 27   Temp:    98 F (36.7 C)  TempSrc:      SpO2:  93% 93%   Weight:      Height:        Intake/Output Summary (Last 24 hours) at 01/22/2022 0737 Last data filed at 01/22/2022 0234 Gross per 24 hour  Intake 8.84 ml  Output --  Net 8.84 ml      01/21/2022    4:40 PM 04/07/2019   11:46 AM 03/31/2019    4:06 PM  Last 3 Weights  Weight (lbs) 400 lb 325 lb 346 lb 4 oz  Weight (kg) 181.439 kg 147.419 kg 157.058 kg      Telemetry    SR - Personally Reviewed  ECG    Sinus rhythm, RBBB - Personally Reviewed  Physical Exam   GEN: No acute distress.   Neck: No JVD Cardiac: RRR, no murmurs, rubs, or gallops.  Respiratory: Clear to auscultation bilaterally. GI: Soft, nontender, non-distended  MS: No edema; No deformity. Neuro:  Nonfocal  Psych: Normal affect   Labs    High Sensitivity Troponin:   Recent Labs  Lab 01/21/22 1651 01/22/22 0030  TROPONINIHS 2,264* 6,155*     Chemistry Recent Labs   Lab 01/21/22 1651 01/22/22 0350  NA 142 140  K 3.9 3.6  CL 100 100  CO2 28 28  GLUCOSE 174* 235*  BUN 17 17  CREATININE 1.14 1.04  CALCIUM 9.6 9.3  GFRNONAA >60 >60  ANIONGAP 14 12    Lipids  Recent Labs  Lab 01/22/22 0030  CHOL 130  TRIG 335*  HDL 37*  LDLCALC 26  CHOLHDL 3.5    Hematology Recent Labs  Lab 01/21/22 1651 01/22/22 0350  WBC 8.7 9.5  RBC 4.72 4.52  HGB 14.9 14.3  HCT 46.3 44.4  MCV 98.1 98.2  MCH 31.6 31.6  MCHC 32.2 32.2  RDW 14.2 14.2  PLT 200 214   Thyroid No results for input(s): "TSH", "FREET4" in the last 168 hours.  BNP Recent Labs  Lab 01/21/22 1658  BNP 49.4    DDimer No results for input(s): "DDIMER" in the last 168 hours.   Radiology    DG Chest 1 View  Result Date: 01/21/2022 CLINICAL DATA:  Chest pain EXAM: CHEST  1 VIEW COMPARISON:  01/25/2019 FINDINGS:  The patient's head and chin obscure parts of the apices. Streaky atelectasis or scarring left base. No focal consolidation or effusion. Stable cardiomediastinal silhouette with aortic atherosclerosis. No pneumothorax. Multiple clips in the left upper quadrant IMPRESSION: No active disease. Streaky atelectasis or scarring at the left base. Electronically Signed   By: Donavan Foil M.D.   On: 01/21/2022 17:14    Cardiac Studies   Pending echo and cath   Patient Profile     71 y.o. male history of chronic diastolic heart failure, diabetes mellitus, hypertension, hyperlipidemia who presented for chest pain and found to have elevated troponin.  Assessment & Plan    NSTEMI -Chest pain-free on nitroglycerin drip and heparin. Last HS-troponin 6155.  -Patient reports he cannot lay flat completely without discomfort due to titanium rod in his hip to neck -Continue aspirin, Coreg and Lipitor  The patient understands that risks include but are not limited to stroke (1 in 1000), death (1 in 1000), kidney failure [usually temporary] (1 in 500), bleeding (1 in 200), allergic reaction  [possibly serious] (1 in 200), and agrees to proceed.     2. HFpEF -Reported lower extremity edema and orthopnea -At least 2 L of output after IV Lasix -Likely additional diuresis -Pending echocardiogram -Continue Coreg  3.  Hypertension -Continue Norvasc and carvedilol   4.  Lipidemia 01/22/2022: Cholesterol 130; HDL 37; LDL Cholesterol 26; Triglycerides 335; VLDL 67  -Continue Lipitor  For questions or updates, please contact [place] Please consult [email or web address] for contact info under        SignedLeanor Kail, PA  01/22/2022, 7:37 AM

## 2022-01-22 NOTE — ED Notes (Signed)
Pt moved to hospital bed at this time.

## 2022-01-22 NOTE — Progress Notes (Signed)
   Heart Failure Stewardship Pharmacist Progress Note   PCP: Saner, Janean Sark, MD PCP-Cardiologist: None    HPI:  Phillip Maldonado is a 71 year old male with PMH of  HFpEF, OSA, atrial fibrillation, type 2 diabetes, hypertension, hyperlipidemia, obesity who presented with chest pain radiating to the left side of his chest and jaw found to have NSTEMI. Previous echocardiogram 01/22/19 showed LVEF of 60-65% with grade 1 diastolic dysfunction. Echocardiogram completed on 11/9 was technically difficult given body habitus, read as low normal with grade I diastolic dysfunction. Coronary angiography scheduled for today.   Patient reports shortness of breath has improved with mn. No urine output charted, however RN reports about 3L urine output overnight. Urine color is yellow. Patient requiring O2 currently with none PTA. Volume status is difficult to assess visually given body habitus.   Current HF Medications: Diuretic: Lasix 120 mg IV x 1 Beta Blocker: Coreg 12.5 mg BID  Prior to admission HF Medications: Diuretic: torsemide 100 mg daily Beta blocker: Coreg 12.5 mg BID ACE/ARB/ARNI: losartan 50 mg BID  Pertinent Lab Values: Serum creatinine 1.04, BUN 17, Potassium 3.6, Sodium 140, BNP 49.4, Magnesium 1.7, A1c 8.1   Vital Signs: Weight: none charted Blood pressure: 150-180/80s  Heart rate: 80s  I/O: -3L yesterday  Medication Assistance / Insurance Benefits Check: Does the patient have prescription insurance?  Yes Type of insurance plan: Medicare  Does the patient qualify for medication assistance through manufacturers or grants?   Yes Eligible grants and/or patient assistance programs: pending Medication assistance applications in progress: pending  Medication assistance applications approved: pending Approved medication assistance renewals will be completed by: pending  Outpatient Pharmacy:  Prior to admission outpatient pharmacy: [address] [facility] Is the patient willing  to use [place] pharmacy at discharge? Yes Is the patient willing to transition their outpatient pharmacy to utilize a [facility] outpatient pharmacy?   Pending    Assessment: 1. Acute on chronic diastolic CHF (LVEF 24-40% in 2020). NYHA class III symptoms. - Patient has had good urine output after Lasix 120 mg IV. Would likely benefit from scheduled diuresis, but will defer further doses until after cath. Strict I/Os and daily weights.  - The addition of SGLT2i is ideal given HFpEF. Can consider spironolactone after cath. - BP remains up on NTG drip, can consider restarting PTA ARB when creatinine is stable after cath.   Plan: 1) Medication changes recommended at this time: - Start SGLT2i for HFpEF - Give Mg 4g and potassium 40 meq once - Follow cath results  2) Patient assistance: - Farxiga copay $0, Jardiance copay $0, Entresto $0  3)  Education  - To be completed prior to discharge  Thank you for allowing pharmacy to participate in this patient's care.  Reatha Harps, PharmD PGY2 Pharmacy Resident 01/22/2022 7:20 AM Check [email or web address] for unit specific pharmacy number

## 2022-01-22 NOTE — Progress Notes (Signed)
ANTICOAGULATION CONSULT NOTE - Initial Consult  Pharmacy Consult for heparin Indication:  NSTEMI  Allergies  Allergen Reactions   Chlorzoxazone Other (See Comments)    Other reaction(s): Angioedema (ALLERGY/intolerance)   Piroxicam Shortness Of Breath and Swelling    Tongue   Latex Other (See Comments) and Rash    Rash and itching Other reaction(s): Other (See Comments) Burning Feeling   Adhesive [Tape] Other (See Comments)    welts Unknown paper tape ok to use     Patient Measurements: Height: '5\' 8"'$  (172.7 cm) Weight: (!) 181.4 kg (400 lb) IBW/kg (Calculated) : 68.4 Heparin Dosing Weight: 115kg  Vital Signs: Temp: 97.7 F (36.5 C) (11/09 0059) Temp Source: Oral (11/09 0059) BP: 186/89 (11/09 0145) Pulse Rate: 89 (11/09 0145)  Labs: Recent Labs    01/21/22 1651 01/22/22 0030  HGB 14.9  --   HCT 46.3  --   PLT 200  --   APTT  --  27  LABPROT  --  13.5  INR  --  1.0  CREATININE 1.14  --   TROPONINIHS 2,264* 6,155*    Estimated Creatinine Clearance: 96.9 mL/min (by C-G formula based on SCr of 1.14 mg/dL).   Medical History: Past Medical History:  Diagnosis Date   Anemia    Angina    Anxiety    Arthritis    Atrial fibrillation with RVR ([place]) 01/24/2013   Blood transfusion    last 10' 14- "GI bleed"   CHF (congestive heart failure) (HCC)    CKD (chronic kidney disease) stage 3, GFR 30-59 ml/min ([place]) 01/08/2016   Depression    Diabetes mellitus ([place]) 09/18/2011   Diverticulosis    DJD (degenerative joint disease)    DM type 2, uncontrolled, 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   Edema extremities    bilateral lower extremities-"weepy areas due to fluid retention"   Fatty liver    Fundic gland polyps of stomach, benign    Headache(784.0)    Hepatitis B    History of alcohol abuse    last drink in 1993   Hypertension    Neuromuscular disorder ([place])    Neuropathy, 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 06-04-14 "doesn't use much now, no mask/tubing now.    Assessment: 71yo male c/o CP radiating to RUE/neck associated w/ some SOB, troponins elevated and rising >> to start heparin.  Goal of Therapy:  Heparin level 0.3-0.7 units/ml Monitor platelets by anticoagulation protocol: Yes   Plan:  EDP started heparin infusion at 1600 units/hr. Monitor heparin levels and CBC.  Wynona Neat, PharmD, BCPS  01/22/2022,2:31 AM

## 2022-01-22 NOTE — Progress Notes (Addendum)
Rounding Note    Patient Name: Phillip Maldonado Date of Encounter: 01/22/2022  Will Cardiologist: None   Subjective   No recurrent chest pain.  Breathing has improved.  Reports he has titanium rod from his hip to neck thus unable to lay flat completely without discomfort.  Inpatient Medications    Scheduled Meds:  amLODipine  5 mg Oral Daily   [START ON 01/23/2022] aspirin EC  81 mg Oral Daily   atorvastatin  40 mg Oral Daily   carvedilol  12.5 mg Oral BID   DULoxetine  60 mg Oral Daily   pantoprazole  40 mg Oral QAC breakfast   pregabalin  150 mg Oral TID   trazodone  300 mg Oral QHS   Continuous Infusions:  sodium chloride 10 mL/hr at 01/22/22 0033   heparin 1,600 Units/hr (01/22/22 0248)   nitroGLYCERIN 50 mcg/min (01/22/22 0312)   PRN Meds: acetaminophen, ondansetron (ZOFRAN) IV   Vital Signs    Vitals:   01/22/22 0245 01/22/22 0250 01/22/22 0607 01/22/22 0645  BP: (!) 188/137  114/79   Pulse: 83 81 85   Resp: (!) 23 (!) 24 (!) 27   Temp:    98 F (36.7 C)  TempSrc:      SpO2:  93% 93%   Weight:      Height:        Intake/Output Summary (Last 24 hours) at 01/22/2022 0737 Last data filed at 01/22/2022 0234 Gross per 24 hour  Intake 8.84 ml  Output --  Net 8.84 ml      01/21/2022    4:40 PM 04/07/2019   11:46 AM 03/31/2019    4:06 PM  Last 3 Weights  Weight (lbs) 400 lb 325 lb 346 lb 4 oz  Weight (kg) 181.439 kg 147.419 kg 157.058 kg      Telemetry    SR - Personally Reviewed  ECG    Sinus rhythm, RBBB - Personally Reviewed  Physical Exam   GEN: No acute distress.   Neck: No JVD Cardiac: RRR, no murmurs, rubs, or gallops.  Respiratory: Clear to auscultation bilaterally. GI: Soft, nontender, non-distended  MS: No edema; No deformity. Neuro:  Nonfocal  Psych: Normal affect   Labs    High Sensitivity Troponin:   Recent Labs  Lab 01/21/22 1651 01/22/22 0030  TROPONINIHS 2,264* 6,155*     Chemistry Recent Labs   Lab 01/21/22 1651 01/22/22 0350  NA 142 140  K 3.9 3.6  CL 100 100  CO2 28 28  GLUCOSE 174* 235*  BUN 17 17  CREATININE 1.14 1.04  CALCIUM 9.6 9.3  GFRNONAA >60 >60  ANIONGAP 14 12    Lipids  Recent Labs  Lab 01/22/22 0030  CHOL 130  TRIG 335*  HDL 37*  LDLCALC 26  CHOLHDL 3.5    Hematology Recent Labs  Lab 01/21/22 1651 01/22/22 0350  WBC 8.7 9.5  RBC 4.72 4.52  HGB 14.9 14.3  HCT 46.3 44.4  MCV 98.1 98.2  MCH 31.6 31.6  MCHC 32.2 32.2  RDW 14.2 14.2  PLT 200 214   Thyroid No results for input(s): "TSH", "FREET4" in the last 168 hours.  BNP Recent Labs  Lab 01/21/22 1658  BNP 49.4    DDimer No results for input(s): "DDIMER" in the last 168 hours.   Radiology    DG Chest 1 View  Result Date: 01/21/2022 CLINICAL DATA:  Chest pain EXAM: CHEST  1 VIEW COMPARISON:  01/25/2019 FINDINGS:  The patient's head and chin obscure parts of the apices. Streaky atelectasis or scarring left base. No focal consolidation or effusion. Stable cardiomediastinal silhouette with aortic atherosclerosis. No pneumothorax. Multiple clips in the left upper quadrant IMPRESSION: No active disease. Streaky atelectasis or scarring at the left base. Electronically Signed   By: Donavan Foil M.D.   On: 01/21/2022 17:14    Cardiac Studies   Pending echo and cath   Patient Profile     71 y.o. male history of chronic diastolic heart failure, diabetes mellitus, hypertension, hyperlipidemia who presented for chest pain and found to have elevated troponin.  Assessment & Plan    NSTEMI -Chest pain-free on nitroglycerin drip and heparin. Last HS-troponin 6155.  -Patient reports he cannot lay flat completely without discomfort due to titanium rod in his hip to neck -Continue aspirin, Coreg and Lipitor  The patient understands that risks include but are not limited to stroke (1 in 1000), death (1 in 1000), kidney failure [usually temporary] (1 in 500), bleeding (1 in 200), allergic reaction  [possibly serious] (1 in 200), and agrees to proceed.     2. HFpEF -Reported lower extremity edema and orthopnea -At least 2 L of output after IV Lasix -Likely additional diuresis -Pending echocardiogram -Continue Coreg  3.  Hypertension -Continue Norvasc and carvedilol   4.  Lipidemia 01/22/2022: Cholesterol 130; HDL 37; LDL Cholesterol 26; Triglycerides 335; VLDL 67  -Continue Lipitor  For questions or updates, please contact [place] Please consult [email or web address] for contact info under        SignedLeanor Kail, PA  01/22/2022, 7:37 AM

## 2022-01-23 ENCOUNTER — Other Ambulatory Visit (HOSPITAL_COMMUNITY): Payer: Self-pay

## 2022-01-23 ENCOUNTER — Encounter (HOSPITAL_COMMUNITY): Payer: Self-pay | Admitting: Cardiovascular Disease

## 2022-01-23 DIAGNOSIS — I214 Non-ST elevation (NSTEMI) myocardial infarction: Secondary | ICD-10-CM | POA: Diagnosis not present

## 2022-01-23 DIAGNOSIS — Z6841 Body Mass Index (BMI) 40.0 and over, adult: Secondary | ICD-10-CM | POA: Diagnosis not present

## 2022-01-23 DIAGNOSIS — I10 Essential (primary) hypertension: Secondary | ICD-10-CM | POA: Diagnosis not present

## 2022-01-23 DIAGNOSIS — I5033 Acute on chronic diastolic (congestive) heart failure: Secondary | ICD-10-CM | POA: Diagnosis not present

## 2022-01-23 LAB — CBC
HCT: 43.8 % (ref 39.0–52.0)
HCT: 42.8 % (ref 39.0–52.0)
Hemoglobin: 14.2 g/dL (ref 13.0–17.0)
Hemoglobin: 14.1 g/dL (ref 13.0–17.0)
MCH: 31.8 pg (ref 26.0–34.0)
MCH: 31.9 pg (ref 26.0–34.0)
MCHC: 32.4 g/dL (ref 30.0–36.0)
MCHC: 32.9 g/dL (ref 30.0–36.0)
MCV: 98 fL (ref 80.0–100.0)
MCV: 96.8 fL (ref 80.0–100.0)
Platelets: 188 10*3/uL (ref 150–400)
Platelets: 186 10*3/uL (ref 150–400)
RBC: 4.47 MIL/uL (ref 4.22–5.81)
RBC: 4.42 MIL/uL (ref 4.22–5.81)
RDW: 14.6 % (ref 11.5–15.5)
RDW: 14.4 % (ref 11.5–15.5)
WBC: 9.8 10*3/uL (ref 4.0–10.5)
WBC: 9.2 10*3/uL (ref 4.0–10.5)
nRBC: 0 % (ref 0.0–0.2)
nRBC: 0 % (ref 0.0–0.2)

## 2022-01-23 LAB — BASIC METABOLIC PANEL
Anion gap: 15 (ref 5–15)
BUN: 15 mg/dL (ref 8–23)
CO2: 28 mmol/L (ref 22–32)
Calcium: 9.2 mg/dL (ref 8.9–10.3)
Chloride: 98 mmol/L (ref 98–111)
Creatinine, Ser: 1.15 mg/dL (ref 0.61–1.24)
GFR, Estimated: >60 mL/min (ref >60)
Glucose, Bld: 204 mg/dL — ABNORMAL HIGH (ref 70–99)
Potassium: 3.2 mmol/L — ABNORMAL LOW (ref 3.5–5.1)
Sodium: 141 mmol/L (ref 135–145)

## 2022-01-23 LAB — MAGNESIUM: Magnesium: 1.6 mg/dL — ABNORMAL LOW (ref 1.7–2.4)

## 2022-01-23 LAB — GLUCOSE, CAPILLARY
Glucose-Capillary: 288 mg/dL — ABNORMAL HIGH (ref 70–99)
Glucose-Capillary: 223 mg/dL — ABNORMAL HIGH (ref 70–99)
Glucose-Capillary: 280 mg/dL — ABNORMAL HIGH (ref 70–99)
Glucose-Capillary: 246 mg/dL — ABNORMAL HIGH (ref 70–99)

## 2022-01-23 LAB — CREATININE, SERUM
Creatinine, Ser: 1.15 mg/dL (ref 0.61–1.24)
GFR, Estimated: >60 mL/min (ref >60)

## 2022-01-23 LAB — TSH: TSH: 1.433 u[IU]/mL (ref 0.350–4.500)

## 2022-01-23 LAB — LIPOPROTEIN A (LPA): Lipoprotein (a): 143.2 nmol/L — ABNORMAL HIGH (ref <75.0)

## 2022-01-23 MED ORDER — SPIRONOLACTONE 25 MG PO TABS
25.0000 mg | ORAL_TABLET | Freq: Every day | ORAL | 3 refills | Status: DC
  Filled 2022-01-23: qty 90, 90d supply, fill #0

## 2022-01-23 MED ORDER — GERHARDT'S BUTT CREAM
TOPICAL_CREAM | CUTANEOUS | Status: DC | PRN
  Filled 2022-01-23: qty 1

## 2022-01-23 MED ORDER — DAPAGLIFLOZIN PROPANEDIOL 10 MG PO TABS
10.0000 mg | ORAL_TABLET | Freq: Every day | ORAL | 6 refills | Status: DC
  Filled 2022-01-23: qty 30, 30d supply, fill #0

## 2022-01-23 MED ORDER — DAPAGLIFLOZIN PROPANEDIOL 10 MG PO TABS
10.0000 mg | ORAL_TABLET | Freq: Every day | ORAL | Status: DC
  Administered 2022-01-23 – 2022-01-24 (×2): 10 mg via ORAL
  Filled 2022-01-23 (×2): qty 1

## 2022-01-23 MED ORDER — SACUBITRIL-VALSARTAN 24-26 MG PO TABS
1.0000 | ORAL_TABLET | Freq: Two times a day (BID) | ORAL | Status: DC
  Administered 2022-01-24: 1 via ORAL
  Filled 2022-01-23: qty 1

## 2022-01-23 MED ORDER — MAGNESIUM SULFATE 2 GM/50ML IV SOLN
2.0000 g | Freq: Once | INTRAVENOUS | Status: AC
  Administered 2022-01-23: 2 g via INTRAVENOUS
  Filled 2022-01-23: qty 50

## 2022-01-23 MED ORDER — POTASSIUM CHLORIDE CRYS ER 20 MEQ PO TBCR
40.0000 meq | EXTENDED_RELEASE_TABLET | Freq: Two times a day (BID) | ORAL | Status: AC
  Administered 2022-01-23 (×2): 40 meq via ORAL
  Filled 2022-01-23 (×2): qty 2

## 2022-01-23 MED ORDER — ASPIRIN 81 MG PO TBEC
81.0000 mg | DELAYED_RELEASE_TABLET | Freq: Every day | ORAL | 12 refills | Status: DC
  Filled 2022-01-23: qty 30, 30d supply, fill #0

## 2022-01-23 MED ORDER — SPIRONOLACTONE 25 MG PO TABS
25.0000 mg | ORAL_TABLET | Freq: Every day | ORAL | Status: DC
  Administered 2022-01-23 – 2022-01-24 (×2): 25 mg via ORAL
  Filled 2022-01-23 (×2): qty 1

## 2022-01-23 MED ORDER — ENOXAPARIN SODIUM 40 MG/0.4ML IJ SOSY
40.0000 mg | PREFILLED_SYRINGE | INTRAMUSCULAR | Status: DC
  Administered 2022-01-23: 40 mg via SUBCUTANEOUS
  Filled 2022-01-23: qty 0.4

## 2022-01-23 MED ORDER — ICOSAPENT ETHYL 1 G PO CAPS
2.0000 g | ORAL_CAPSULE | Freq: Two times a day (BID) | ORAL | 3 refills | Status: DC
  Filled 2022-01-23: qty 120, 30d supply, fill #0

## 2022-01-23 MED ORDER — LOSARTAN POTASSIUM 50 MG PO TABS
50.0000 mg | ORAL_TABLET | Freq: Every day | ORAL | Status: DC
  Administered 2022-01-23 – 2022-01-24 (×2): 50 mg via ORAL
  Filled 2022-01-23 (×2): qty 1

## 2022-01-23 MED ORDER — TICAGRELOR 90 MG PO TABS
90.0000 mg | ORAL_TABLET | Freq: Two times a day (BID) | ORAL | 11 refills | Status: DC
  Filled 2022-01-23: qty 60, 30d supply, fill #0

## 2022-01-23 MED FILL — Tirofiban HCl in NaCl 0.9% IV Soln 5 MG/100ML (Base Equiv): INTRAVENOUS | Qty: 100 | Status: AC

## 2022-01-23 NOTE — Progress Notes (Signed)
Cardiology Progress Note  Patient ID: Phillip Maldonado MRN: [id number] DOB: 07/07/1950 Date of Encounter: 01/23/2022  Primary Cardiologist: None  Subjective   Chief Complaint: none.   HPI: Denies chest pain.  LVEDP elevated at time of heart cath.  Needs diuresis.  ROS:  All other ROS reviewed and negative. Pertinent positives noted in the HPI.     Inpatient Medications  Scheduled Meds:  amLODipine  5 mg Oral Daily   aspirin EC  81 mg Oral Daily   atorvastatin  80 mg Oral Daily   carvedilol  12.5 mg Oral BID   DULoxetine  60 mg Oral Daily   furosemide  40 mg Intravenous Q12H   icosapent Ethyl  2 g Oral BID   insulin aspart  0-15 Units Subcutaneous TID WC   insulin aspart  0-5 Units Subcutaneous QHS   pantoprazole  40 mg Oral QAC breakfast   potassium chloride  40 mEq Oral BID   pregabalin  150 mg Oral TID   sodium chloride flush  3 mL Intravenous Q12H   spironolactone  25 mg Oral Daily   ticagrelor  90 mg Oral BID   trazodone  300 mg Oral QHS   Continuous Infusions:  sodium chloride 10 mL/hr at 01/22/22 0348   sodium chloride Stopped (01/22/22 1614)   sodium chloride     magnesium sulfate bolus IVPB     PRN Meds: sodium chloride, acetaminophen, morphine injection, ondansetron (ZOFRAN) IV, mouth rinse, oxyCODONE, sodium chloride flush   Vital Signs   Vitals:   01/22/22 2014 01/22/22 2255 01/23/22 0524 01/23/22 0739  BP: (!) 169/91 (!) 151/79 (!) 160/89 (!) 145/70  Pulse: 85 81 82 81  Resp: '20 18 18 17  '$ Temp: 98.4 F (36.9 C) 98.3 F (36.8 C) 98.6 F (37 C) 97.6 F (36.4 C)  TempSrc: Oral Oral Oral Oral  SpO2: 97% 96% 96% 97%  Weight:   (!) 181.3 kg   Height:        Intake/Output Summary (Last 24 hours) at 01/23/2022 1040 Last data filed at 01/23/2022 0748 Gross per 24 hour  Intake 2083.1 ml  Output 1450 ml  Net 633.1 ml      01/23/2022    5:24 AM 01/21/2022    4:40 PM 04/07/2019   11:46 AM  Last 3 Weights  Weight (lbs) 399 lb 11.2 oz 400 lb  325 lb  Weight (kg) 181.303 kg 181.439 kg 147.419 kg      Telemetry  Overnight telemetry shows sinus rhythm in the 80s, which I personally reviewed.   ECG  The most recent ECG shows sinus rhythm, right bundle branch block and left anterior fascicular block, which I personally reviewed.   Physical Exam   Vitals:   01/22/22 2014 01/22/22 2255 01/23/22 0524 01/23/22 0739  BP: (!) 169/91 (!) 151/79 (!) 160/89 (!) 145/70  Pulse: 85 81 82 81  Resp: '20 18 18 17  '$ Temp: 98.4 F (36.9 C) 98.3 F (36.8 C) 98.6 F (37 C) 97.6 F (36.4 C)  TempSrc: Oral Oral Oral Oral  SpO2: 97% 96% 96% 97%  Weight:   (!) 181.3 kg   Height:        Intake/Output Summary (Last 24 hours) at 01/23/2022 1040 Last data filed at 01/23/2022 0748 Gross per 24 hour  Intake 2083.1 ml  Output 1450 ml  Net 633.1 ml       01/23/2022    5:24 AM 01/21/2022    4:40 PM 04/07/2019   11:46  AM  Last 3 Weights  Weight (lbs) 399 lb 11.2 oz 400 lb 325 lb  Weight (kg) 181.303 kg 181.439 kg 147.419 kg    Body mass index is 60.77 kg/m.  General: Obese male Head: Atraumatic, normal size  Eyes: PEERLA, EOMI  Neck: Supple, no JVD Endocrine: No thryomegaly Cardiac: Normal S1, S2; RRR; no murmurs, rubs, or gallops Lungs: Diminished breath sounds bilaterally Abd: Soft, nontender, no hepatomegaly  Ext: 1+ pitting edema Musculoskeletal: No deformities, BUE and BLE strength normal and equal Skin: Warm and dry, no rashes   Neuro: Alert and oriented to person, place, time, and situation, CNII-XII grossly intact, no focal deficits  Psych: Normal mood and affect   Labs  High Sensitivity Troponin:   Recent Labs  Lab 01/21/22 1651 01/22/22 0030  TROPONINIHS 2,264* 6,155*     Cardiac EnzymesNo results for input(s): "TROPONINI" in the last 168 hours. No results for input(s): "TROPIPOC" in the last 168 hours.  Chemistry Recent Labs  Lab 01/21/22 1651 01/22/22 0350 01/23/22 0154  NA 142 140 141  K 3.9 3.6 3.2*  CL 100  100 98  CO2 '28 28 28  '$ GLUCOSE 174* 235* 204*  BUN '17 17 15  '$ CREATININE 1.14 1.04 1.15  CALCIUM 9.6 9.3 9.2  GFRNONAA >60 >60 >60  ANIONGAP '14 12 15    '$ Hematology Recent Labs  Lab 01/21/22 1651 01/22/22 0350 01/23/22 0154  WBC 8.7 9.5 9.8  RBC 4.72 4.52 4.47  HGB 14.9 14.3 14.2  HCT 46.3 44.4 43.8  MCV 98.1 98.2 98.0  MCH 31.6 31.6 31.8  MCHC 32.2 32.2 32.4  RDW 14.2 14.2 14.6  PLT 200 214 188   BNP Recent Labs  Lab 01/21/22 1658  BNP 49.4    DDimer No results for input(s): "DDIMER" in the last 168 hours.   Radiology  CARDIAC CATHETERIZATION  Result Date: 01/22/2022   Prox RCA lesion is 30% stenosed.   1st Mrg lesion is 100% stenosed.   Dist LAD lesion is 30% stenosed.   A drug-eluting stent was successfully placed using a SYNERGY XD 3.0X16.   Post intervention, there is a 0% residual stenosis. Mild non-obstructive disease in the mid and distal LAD Large Circumflex with occluded large first obtuse marginal branch. Successful PTCA/DES x 1 obtuse marginal branch Large dominant RCA with mild mid vessel stenosis LVEDP=24 mmHg Recommendations: Will continue DAPT with ASA and Brilinta for one year. Continue beta blocker and statin. I will give one dose of IV Lasix tonight.   ECHOCARDIOGRAM COMPLETE  Result Date: 01/22/2022    ECHOCARDIOGRAM REPORT   Patient Name:   Phillip Maldonado Date of Exam: 01/22/2022 Medical Rec #:  [id number]          Height:       68.0 in Accession #:    [id number]         Weight:       400.0 lb Date of Birth:  [birth year]          BSA:          2.744 m Patient Age:    71 years           BP:           151/80 mmHg Patient Gender: M                  HR:           85 bpm. Exam Location:  Inpatient Procedure: 2D Echo, Intracardiac Opacification  Agent, Cardiac Doppler and Color            Doppler Indications:    NSTEMI I21.4  History:        Patient has prior history of Echocardiogram examinations, most                 recent 01/22/2019. CHF, NSTEMI,  Arrythmias:Atrial Fibrillation,                 Signs/Symptoms:Shortness of Breath and Chest Pain; Risk                 Factors:Hypertension, Dyslipidemia, Sleep Apnea, Diabetes and                 Non-Smoker.  Sonographer:    Greer Pickerel Referring Phys: [id number] [place] A SULEIMAN  Sonographer Comments: Technically difficult study due to poor echo windows, Technically challenging study due to limited acoustic windows, no subcostal window, patient is obese, suboptimal parasternal window and suboptimal apical window. Image acquisition challenging due to patient body habitus and Image acquisition challenging due to respiratory motion. Patient could not roll or lie flat. IMPRESSIONS  1. Normal to low normal EF endocardium not well seen even with definity Cannot r/o lateral wall hypokinesis . Left ventricular ejection fraction, by estimation, is Not well seen%. The left ventricle has normal function. The left ventricle has no regional wall motion abnormalities. Left ventricular diastolic parameters are consistent with Grade I diastolic dysfunction (impaired relaxation).  2. Right ventricular systolic function was not well visualized. The right ventricular size is not well visualized. There is normal pulmonary artery systolic pressure.  3. The mitral valve is abnormal. Trivial mitral valve regurgitation. No evidence of mitral stenosis.  4. The aortic valve is tricuspid. Aortic valve regurgitation is not visualized. No aortic stenosis is present.  5. 4.3 cm my measurement . Aortic dilatation noted. There is moderate dilatation of the aortic root, measuring 43 mm.  6. The inferior vena cava is normal in size with greater than 50% respiratory variability, suggesting right atrial pressure of 3 mmHg. FINDINGS  Left Ventricle: Normal to low normal EF endocardium not well seen even with definity Cannot r/o lateral wall hypokinesis. Left ventricular ejection fraction, by estimation, is Not well seen%. The left ventricle has normal  function. The left ventricle has no regional wall motion abnormalities. Definity contrast agent was given IV to delineate the left ventricular endocardial borders. The left ventricular internal cavity size was normal in size. There is no left ventricular hypertrophy. Left ventricular diastolic parameters are consistent with Grade I diastolic dysfunction (impaired relaxation). Right Ventricle: The right ventricular size is not well visualized. Right vetricular wall thickness was not assessed. Right ventricular systolic function was not well visualized. There is normal pulmonary artery systolic pressure. The tricuspid regurgitant velocity is 1.14 m/s, and with an assumed right atrial pressure of 8 mmHg, the estimated right ventricular systolic pressure is 44.0 mmHg. Left Atrium: Left atrial size was normal in size. Right Atrium: Right atrial size was normal in size. Pericardium: There is no evidence of pericardial effusion. Mitral Valve: The mitral valve is abnormal. Mild mitral annular calcification. Trivial mitral valve regurgitation. No evidence of mitral valve stenosis. Tricuspid Valve: The tricuspid valve is normal in structure. Tricuspid valve regurgitation is not demonstrated. No evidence of tricuspid stenosis. Aortic Valve: The aortic valve is tricuspid. Aortic valve regurgitation is not visualized. No aortic stenosis is present. Pulmonic Valve: The pulmonic valve was normal in structure. Pulmonic valve regurgitation is  not visualized. No evidence of pulmonic stenosis. Aorta: 4.3 cm my measurement. Aortic dilatation noted. There is moderate dilatation of the aortic root, measuring 43 mm. Venous: The inferior vena cava is normal in size with greater than 50% respiratory variability, suggesting right atrial pressure of 3 mmHg. IAS/Shunts: The interatrial septum was not well visualized.  LEFT VENTRICLE PLAX 2D LVOT diam:     2.50 cm   Diastology LV SV:         78        LV e' medial:    3.92 cm/s LV SV Index:    28        LV E/e' medial:  13.5 LVOT Area:     4.91 cm  LV e' lateral:   8.70 cm/s                          LV E/e' lateral: 6.1  RIGHT VENTRICLE RV S prime:     16.00 cm/s TAPSE (M-mode): 1.9 cm LEFT ATRIUM           Index        RIGHT ATRIUM           Index LA Vol (A4C): 97.5 ml 35.53 ml/m  RA Area:     16.20 cm                                    RA Volume:   41.10 ml  14.98 ml/m  AORTIC VALVE LVOT Vmax:   87.40 cm/s LVOT Vmean:  74.300 cm/s LVOT VTI:    0.159 m  AORTA Ao Root diam: 4.50 cm Ao Asc diam:  4.30 cm MITRAL VALVE                TRICUSPID VALVE MV Area (PHT): 5.93 cm     TR Peak grad:   5.2 mmHg MV Decel Time: 128 msec     TR Vmax:        114.00 cm/s MR Peak grad: 4.3 mmHg MR Mean grad: 3.0 mmHg      SHUNTS MR Vmax:      104.00 cm/s   Systemic VTI:  0.16 m MR Vmean:     85.6 cm/s     Systemic Diam: 2.50 cm MV E velocity: 52.90 cm/s MV A velocity: 103.00 cm/s MV E/A ratio:  0.51 Jenkins Rouge MD Electronically signed by Jenkins Rouge MD Signature Date/Time: 01/22/2022/12:08:00 PM    Final    DG Chest 1 View  Result Date: 01/21/2022 CLINICAL DATA:  Chest pain EXAM: CHEST  1 VIEW COMPARISON:  01/25/2019 FINDINGS: The patient's head and chin obscure parts of the apices. Streaky atelectasis or scarring left base. No focal consolidation or effusion. Stable cardiomediastinal silhouette with aortic atherosclerosis. No pneumothorax. Multiple clips in the left upper quadrant IMPRESSION: No active disease. Streaky atelectasis or scarring at the left base. Electronically Signed   By: Donavan Foil M.D.   On: 01/21/2022 17:14    Cardiac Studies  [facility] 01/22/2022   Prox RCA lesion is 30% stenosed.   1st Mrg lesion is 100% stenosed.   Dist LAD lesion is 30% stenosed.   A drug-eluting stent was successfully placed using a SYNERGY XD 3.0X16.   Post intervention, there is a 0% residual stenosis.   Mild non-obstructive disease in the mid and distal LAD Large Circumflex with occluded large first obtuse  marginal branch.  Successful PTCA/DES x 1 obtuse marginal branch Large dominant RCA with mild mid vessel stenosis LVEDP=24 mmHg  TTE 01/22/2022  1. Normal to low normal EF endocardium not well seen even with definity  Cannot r/o lateral wall hypokinesis . Left ventricular ejection fraction,  by estimation, is Not well seen%. The left ventricle has normal function.  The left ventricle has no  regional wall motion abnormalities. Left ventricular diastolic parameters  are consistent with Grade I diastolic dysfunction (impaired relaxation).   2. Right ventricular systolic function was not well visualized. The right  ventricular size is not well visualized. There is normal pulmonary artery  systolic pressure.   3. The mitral valve is abnormal. Trivial mitral valve regurgitation. No  evidence of mitral stenosis.   4. The aortic valve is tricuspid. Aortic valve regurgitation is not  visualized. No aortic stenosis is present.   5. 4.3 cm my measurement . Aortic dilatation noted. There is moderate  dilatation of the aortic root, measuring 43 mm.   6. The inferior vena cava is normal in size with greater than 50%  respiratory variability, suggesting right atrial pressure of 3 mmHg.   Patient Profile  71 year old male with HFpEF, diabetes, hypertension, hyperlipidemia who was admitted on 01/21/2022 for non-STEMI.   Assessment & Plan   #Non-STEMI -Left heart catheterization yesterday with occluded OM branch.  Status post PCI. -No chest pain.  Right radial cath site clean and dry.  EKG looks good. -Continue aspirin and Brilinta. -Lipitor 80.  Added Vascepa. -Continue carvedilol. -Globally LV function appears preserved.  #Acute HFpEF -LVEDP elevated.  Continue with Lasix today.  40 mg IV twice daily. -Replace potassium and magnesium. -Add Aldactone 25 mg daily. -Consider SGLT2 inhibitor however given morbid obesity may be high risk for fungal UTI. -Anticipate discharge tomorrow.  We will go  ahead and arrange follow-up.  #Hypertension -Coreg 12.5 mg twice daily, amlodipine 5 mg daily, add back losartan 50 mg daily.  I have added Aldactone 25 mg daily to help with fluid as well. -Weight loss recommended.  #DM -Sliding scale insulin while in-house.  #Chronic back pain -Tylenol.  Oxycodone as needed.  #Morbid obesity #OSA -Weight loss recommended.  FEN -No intravenous fluids -Diet heart healthy -DVT PPx Lovenox -Code: Full -Disposition: Anticipate discharge tomorrow.   For questions or updates, please contact [place] Please consult [email or web address] for contact info under        Signed, [place] T. Audie Box, MD, [place]  01/23/2022 10:40 AM

## 2022-01-23 NOTE — Progress Notes (Signed)
CARDIAC REHAB PHASE I   PRE:  Rate/Rhythm: 84 NSR  BP:  Sitting: 145/70      SaO2: 96 RA  MODE:  Ambulation: 50 ft   POST:  Rate/Rhythm: 98 NSR  BP:  Sitting: 154/97      SaO2: 94 RA  Pt ambulated 33f with RW and contact guard assistance, pt walks with anterior lean with face down d/t pain in neck and hip. Pt denies CP and SOB, RPE 11. Pt was educated on wt restrictions, no baths/daily wash-ups, site care, S/S of infection, ex guidelines (as able), S/S to stop exercising, ntg use and calling 911, heart healthy diet, low na diet, daily weights, and CRPII. [facility]   JChristen Bame MS EP 9:40 AM 01/23/2022

## 2022-01-23 NOTE — Care Management (Signed)
  Transition of Care [facility]) Screening Note   Patient Details  Name: Phillip Maldonado Date of Birth: Apr 05, 1950   Transition of Care [facility]) CM/SW Contact:    Bethena Roys, RN Phone Number: 01/23/2022, 1:03 PM    Transition of Care Department [facility]) has reviewed the patient and no TOC needs have been identified at this time. We will continue to monitor patient advancement through interdisciplinary progression rounds. If new patient transition needs arise, please place a TOC consult.

## 2022-01-23 NOTE — Progress Notes (Signed)
Cardiac meds sent to [facility].

## 2022-01-23 NOTE — Progress Notes (Addendum)
   Heart Failure Stewardship Pharmacist Progress Note   PCP: Saner, Janean Sark, MD PCP-Cardiologist: None    HPI:  Phillip Maldonado is a 71 year old male with PMH of  HFpEF, OSA, atrial fibrillation, type 2 diabetes, hypertension, hyperlipidemia, obesity who presented with chest pain radiating to the left side of his chest and jaw found to have NSTEMI. Previous echocardiogram 01/22/19 showed LVEF of 60-65% with grade 1 diastolic dysfunction. Echocardiogram completed on 11/9 was technically difficult given body habitus, read as low normal with grade I diastolic dysfunction. Left heart cath on 11/9 showed 100% stenosis of the 1st obtuse marginal branch, treated with DES. LVEDP was 24 mmHg.  Patient reports feeling well today. He is no longner requiring oxygen and is sitting up in bed. He denies shortness of breath at rest or moving throughout the room.   Current HF Medications: Diuretic: Lasix 120 mg IV x 1 Beta Blocker: Coreg 12.5 mg BID  Prior to admission HF Medications: Diuretic: torsemide 100 mg daily Beta blocker: Coreg 12.5 mg BID ACE/ARB/ARNI: losartan 50 mg BID  Pertinent Lab Values: Serum creatinine 1.15, BUN 15, Potassium 3.2, Sodium 141, BNP 49.4, Magnesium 1.6, A1c 8.1   Vital Signs: Weight: none charted Blood pressure: 150-180/80s  Heart rate: 80s  I/O: net +0.1L yesterday  Medication Assistance / Insurance Benefits Check: Does the patient have prescription insurance?  Yes Type of insurance plan: Medicare  Does the patient qualify for medication assistance through manufacturers or grants?   Yes Eligible grants and/or patient assistance programs: pending Medication assistance applications in progress: pending  Medication assistance applications approved: pending Approved medication assistance renewals will be completed by: pending  Outpatient Pharmacy:  Prior to admission outpatient pharmacy: [address] [facility] Is the patient willing to use [place] pharmacy at  discharge? Yes Is the patient willing to transition their outpatient pharmacy to utilize a [facility] outpatient pharmacy?   Pending    Assessment: 1. Acute on chronic diastolic CHF (LVEF 65-46% in 2020). NYHA class III symptoms. - Patient has had good urine output after Lasix 120 mg IV. Would likely benefit from scheduled diuresis. Strict I/Os and daily weights.  - Creatinine is stable after cath. BP remains elevated. Consider starting SGLT2i. Given elevated BP, patient would benefit from resuming PTA ARB for HTN/ACS. Consider spironolactone for HFpEF afterwards if repeat creatinine is stable.   Plan: 1) Medication changes recommended at this time: - Start Farxiga 10 mg daily  - Start spironolactone 25 mg daily - Agree with restarting losartan at 50 mg daily - Give Mg and K  2) Patient assistance: - Farxiga copay $0, Jardiance copay $0, Entresto $0  3)  Education  - Patient has been educated on current HF medications and potential additions to HF medication regimen - Patient verbalizes understanding that over the next few months, these medication doses may change and more medications may be added to optimize HF regimen - Patient has been educated on basic disease state pathophysiology and goals of therapy - Patient was educated on maintaining proper hygiene with the use of SGLT2i and to alert MD if any s/s of UTI present  Thank you for allowing pharmacy to participate in this patient's care.  Reatha Harps, PharmD PGY2 Pharmacy Resident 01/23/2022 7:40 AM Check [email or web address] for unit specific pharmacy number

## 2022-01-23 NOTE — Inpatient Diabetes Management (Signed)
Inpatient Diabetes Program Recommendations  AACE/ADA: New Consensus Statement on Inpatient Glycemic Control (2015)  Target Ranges:  Prepandial:   less than 140 mg/dL      Peak postprandial:   less than 180 mg/dL (1-2 hours)      Critically ill patients:  140 - 180 mg/dL   Lab Results  Component Value Date   GLUCAP 288 (H) 01/23/2022   HGBA1C 8.1 (H) 01/22/2022    Review of Glycemic Control  Latest Reference Range & Units 01/22/22 11:34 01/22/22 16:28 01/22/22 17:06 01/22/22 21:34 01/23/22 07:40  Glucose-Capillary 70 - 99 mg/dL 233 (H) 234 (H) 229 (H) 274 (H) 288 (H)   Diabetes history: DM 2 Outpatient Diabetes medications: Lantus 30 units qhs, Metformin 1000 mg bid, Victoza 1.8 mg Daily Current orders for Inpatient glycemic control:  Novolog 0-15 units tid + hs  A1c 8.1% on 11/9 Glucose trends: 229-288  Inpatient Diabetes Program Recommendations:    Glucose trends higher than inpatient goal  -   Consider adding a portion of pts home basal insulin Semglee 15 units )1/2 home dose)  Thanks,  Tama Headings RN, MSN, BC-ADM Inpatient Diabetes Coordinator Team Pager [phone] (8a-5p)

## 2022-01-23 NOTE — Progress Notes (Signed)
Heart Failure Nurse Navigator Progress Note  PCP: Saner, Janean Sark, MD PCP-Cardiologist: None Admission Diagnosis: NSTEMI Admitted from: Home  Presentation:   Phillip Maldonado presented with chest pain, that began around 4:30 am and awoke him from sleep, radiated to right arm and right side of neck. BLE edema, Patient is fairly secondary , uses a electric wheel chair most of the time. B, P 143/112, HR 82, Troponin 6,155, BMI 60.82. IV Heparin and Nitroglycerin started. CXR without obvious pleural effusion or pneumothorax. Recent ECG shows sinus rhythm, right bundle branch block and left anterior fascicular block. [place] 11/9 showed : Mild non-obstructive disease in the mid and distal LAD Large Circumflex with occluded large first obtuse marginal branch.  Successful PTCA/DES x 1 obtuse marginal branch Large dominant RCA with mild mid vessel stenosis LVEDP=24 mmHg.   Patient and his wife were educated on the sign and symptoms of heart failure, testing procedure, daily weights, when to call his doctor or go to the ED, Diet/ fluid restrictions, taking all his medications as prescribed, attending all medical appointments. Both patient and his wife participated and asked questions, then verbalized their understanding. A hospital HF TOC appointment was scheduled for 02/02/2022 @ 12 noon.    ECHO/ LVEF: 60-65 % HFpEF G1DD  Clinical Course:  Past Medical History:  Diagnosis Date   Anemia    Angina    Anxiety    Arthritis    Atrial fibrillation with RVR ([place]) 01/24/2013   Blood transfusion    last 10' 14- "GI bleed"   CHF (congestive heart failure) (HCC)    CKD (chronic kidney disease) stage 3, GFR 30-59 ml/min ([place]) 01/08/2016   Depression    Diabetes mellitus ([place]) 09/18/2011   Diverticulosis    DJD (degenerative joint disease)    DM type 2, uncontrolled, 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   Edema extremities    bilateral lower extremities-"weepy areas due to fluid retention"   Fatty liver     Fundic gland polyps of stomach, benign    Headache(784.0)    Hepatitis B    History of alcohol abuse    last drink in 1993   Hypertension    Neuromuscular disorder ([place])    Neuropathy, median nerve 09/11/2014   Bilateral   NSTEMI (non-ST elevated myocardial infarction) ([place]) 01/22/2022   Obesity    Obesity, morbid ([place]) 07/30/2015   Pneumonia    not at present time   Renal insufficiency    Shortness of breath    Sleep apnea, obstructive    does where bipap. 06-04-14 "doesn't use much now, no mask/tubing now.     Social History   Socioeconomic History   Marital status: Married    Spouse name: Jocelyn Lamer   Number of children: 6   Years of education: Not on file   Highest education level: Master's degree (e.g., MA, MS, MEng, MEd, MSW, MBA)  Occupational History   Occupation: DISABLED  Tobacco Use   Smoking status: Never   Smokeless tobacco: Never  Vaping Use   Vaping Use: Never used  Substance and Sexual Activity   Alcohol use: No    Alcohol/week: 0.0 standard drinks of alcohol   Drug use: No   Sexual activity: Not Currently  Other Topics Concern   Not on file  Social History Narrative   Patient is divorced, he has a total of 6 children 2 of which are deceased. He is a retired Conservation officer, historic buildings. History of alcoholism. No alcohol now. 2 caffeinated beverages daily.  Married for 3rd time   Social Determinants of Health   Financial Resource Strain: Low Risk  (01/23/2022)   Overall Financial Resource Strain (CARDIA)    Difficulty of Paying Living Expenses: Not hard at all  Food Insecurity: No Food Insecurity (01/22/2022)   Hunger Vital Sign    Worried About Running Out of Food in the Last Year: Never true    Ran Out of Food in the Last Year: Never true  Transportation Needs: No Transportation Needs (01/22/2022)   PRAPARE - Hydrologist (Medical): No    Lack of Transportation (Non-Medical): No  Physical Activity: Not on file  Stress: Not on file   Social Connections: Not on file   Education Assessment and Provision:  Detailed education and instructions provided on heart failure disease management including the following:  Signs and symptoms of Heart Failure When to call the physician Importance of daily weights Low sodium diet Fluid restriction Medication management Anticipated future follow-up appointments  Patient education given on each of the above topics.  Patient acknowledges understanding via teach back method and acceptance of all instructions.  Education Materials:  "Living Better With Heart Failure" Booklet, HF zone tool, & Daily Weight Tracker Tool.  Patient has scale at home: No, spoke with CSM about getting him a bariatric scale thru his insurance company.  Patient has pill box at home: yes    High Risk Criteria for Readmission and/or Poor Patient Outcomes: Heart failure hospital admissions (last 6 months): 0  No Show rate: 25 % Difficult social situation: No Demonstrates medication adherence: No, reported he has run out of medications because his prescription expired.  Primary Language: English Literacy level: Reading, writing, and comprehension  Barriers of Care:   Medication compliance  Diet/ fluid restrictions ( Soda, MT.Dew) Daily weights ( CSM to help with his INS to get a bariatric scale ) Continued Education on HF   Considerations/Referrals:   Referral made to Heart Failure Pharmacist Stewardship: yes Referral made to Heart Failure CSW/NCM TOC: Yes Referral made to Heart & Vascular TOC clinic: Yes, 02/02/2022 @ 12 noon.   Items for Follow-up on DC/TOC: Medication compliance Diet/ fluid restrictions ( soda, MT. Dew) Daily weights ( CSM to help with his INS to get a bariatric scale) Continued HF Education   Earnestine Leys, BSN, RN Heart Failure Leisure centre manager Chat Only

## 2022-01-23 NOTE — TOC Initial Note (Signed)
Transition of Care [facility]) - Initial/Assessment Note    Patient Details  Name: Phillip Maldonado MRN: [id number] Date of Birth: 12/29/1950  [facility] CM/SW Contact:    Erenest Rasher, RN Phone Number: [phone] 01/23/2022, 2:51 PM  Clinical Narrative:                  TOC CM spoke to pt and lives in home with wife. States he received his Ecard in the mail that has $325 to use during the month but was not sure what the benefit entailed. Explained he can use to purchase a scale for home. States he will follow up. Explained if he does not use the benefit he will lose each month and it will start new the next month. Has RW, scooter and cane at home. Will continue to follow for dc needs.      Expected Discharge Plan: Home/Self Care Barriers to Discharge: Continued Medical Work up   Patient Goals and CMS Choice        Expected Discharge Plan and Services Expected Discharge Plan: Home/Self Care   Discharge Planning Services: CM Consult   Living arrangements for the past 2 months: Single Family Home                                      Prior Living Arrangements/Services Living arrangements for the past 2 months: Single Family Home Lives with:: Spouse Patient language and need for interpreter reviewed:: Yes Do you feel safe going back to the place where you live?: Yes      Need for Family Participation in Patient Care: No (Comment) Care giver support system in place?: Yes (comment) Current home services: DME (walker, cane, scooter) Criminal Activity/Legal Involvement Pertinent to Current Situation/Hospitalization: No - Comment as needed  Activities of Daily Living Home Assistive Devices/Equipment: Environmental consultant (specify type), Other (Comment), Cane (specify quad or straight) ADL Screening (condition at time of admission) Patient's cognitive ability adequate to safely complete daily activities?: Yes Is the patient deaf or have difficulty hearing?:  Yes Does the patient have difficulty seeing, even when wearing glasses/contacts?: No Does the patient have difficulty concentrating, remembering, or making decisions?: No Patient able to express need for assistance with ADLs?: Yes Does the patient have difficulty dressing or bathing?: Yes Independently performs ADLs?: No Communication: Independent Dressing (OT): Needs assistance Is this a change from baseline?: Change from baseline, expected to last <3days Feeding: Independent Bathing: Independent Toileting: Needs assistance Is this a change from baseline?: Change from baseline, expected to last <3 days Walks in Home: Needs assistance Is this a change from baseline?: Change from baseline, expected to last <3 days Does the patient have difficulty walking or climbing stairs?: Yes Weakness of Legs: Both Weakness of Arms/Hands: None  Permission Sought/Granted Permission sought to share information with : Case Manager, Family Supports, PCP Permission granted to share information with : Yes, Verbal Permission Granted  Share Information with NAME: Vickie Speegle     Permission granted to share info w Relationship: wife  Permission granted to share info w Contact Information: [phone]  Emotional Assessment       Orientation: : Oriented to Self, Oriented to Place, Oriented to  Time, Oriented to Situation   Psych Involvement: No (comment)  Admission diagnosis:  NSTEMI (non-ST elevated myocardial infarction) ([place]) [I21.4] NSTEMI (non-ST elevation myocardial infarction) [facility]) [I21.4] Patient Active Problem List  Diagnosis Date Noted   NSTEMI (non-ST elevated myocardial infarction) ([place]) 01/22/2022   Acute on chronic heart failure with preserved ejection fraction (HFpEF) ([place]) 01/22/2022   NSTEMI (non-ST elevation myocardial infarction) ([place]) 01/22/2022   Bile duct leak    Left knee pain 01/27/2019   Gouty arthritis 01/27/2019   BMI 50.0-59.9, adult ([place]) 01/22/2019   Elevated  LFTs 01/22/2019   Elevated amylase and lipase 01/22/2019   Acute pancreatitis 01/22/2019   Septic shock ([place])    Encounter for central line placement    Abdominal pain 01/20/2019   Malaise 06/10/2016   Dysuria 06/10/2016   CKD (chronic kidney disease) stage 3, GFR 30-59 ml/min ([place]) 01/08/2016   History of recent fall 08/06/2015   Falls frequently 08/06/2015   Obesity, morbid ([place]) 07/30/2015   Pain in joint, ankle and foot 07/23/2015   Encounter for long-term methadone use 04/26/2015   Depression, major 04/24/2015   Osteoarthritis of both knees 12/26/2014   Neuropathy, median nerve 09/11/2014   Allergic rhinitis 08/29/2014   Polydipsia 08/29/2014   Hereditary and idiopathic peripheral neuropathy 08/15/2014   Pain in joint, shoulder region 08/15/2014   Edema 06/20/2014   Fundic gland polyps of stomach, benign    Colon cancer screening    Anemia 05/16/2014   Intertrigo 04/11/2014   B12 deficiency anemia 04/11/2014   Rash and nonspecific skin eruption 04/11/2014   DM type 2, uncontrolled, with neuropathy ([place]) 12/26/2013   PVD (peripheral vascular disease) ([place]) 12/26/2013   Congestive heart disease ([place]) 12/26/2013   Facet arthropathy, lumbar 09/26/2013   Chronic pain syndrome 09/16/2013   History of marginal ulcers 01/31/2013   Atrial fibrillation with RVR ([place]) 01/24/2013   History of gastric bypass 01/02/2013   Rotator cuff tear arthropathy 12/19/2012   HTN (hypertension) 09/18/2011   Hypercholesterolemia 09/18/2011   OSA (obstructive sleep apnea) 09/18/2011   Constipation 05/09/2011   PCP:  Glennie Hawk, MD Pharmacy:   [facility] [place], [place] [address] [facility] [address] [place] [address] Phone: [phone] Fax: [phone]     Social Determinants of Health (SDOH) Interventions Alcohol Usage Interventions: Intervention Not Indicated (Score <7) Financial Strain Interventions:  Intervention Not Indicated  Readmission Risk Interventions     No data to display

## 2022-01-23 NOTE — TOC Benefit Eligibility Note (Signed)
Patient Teacher, English as a foreign language completed.    The patient is currently admitted and upon discharge could be taking Brilinta 90 mg.  The current 30 day co-pay is $0.00.   The patient is insured through [employer], [place] Patient Advocate Specialist [place] Patient Advocate Team Direct Number: [phone]  Fax: [phone]

## 2022-01-24 DIAGNOSIS — I214 Non-ST elevation (NSTEMI) myocardial infarction: Secondary | ICD-10-CM | POA: Diagnosis not present

## 2022-01-24 DIAGNOSIS — I5043 Acute on chronic combined systolic (congestive) and diastolic (congestive) heart failure: Secondary | ICD-10-CM | POA: Diagnosis not present

## 2022-01-24 LAB — BASIC METABOLIC PANEL
Anion gap: 15 (ref 5–15)
BUN: 21 mg/dL (ref 8–23)
CO2: 24 mmol/L (ref 22–32)
Calcium: 8.9 mg/dL (ref 8.9–10.3)
Chloride: 98 mmol/L (ref 98–111)
Creatinine, Ser: 1.24 mg/dL (ref 0.61–1.24)
GFR, Estimated: >60 mL/min (ref >60)
Glucose, Bld: 293 mg/dL — ABNORMAL HIGH (ref 70–99)
Potassium: 3.8 mmol/L (ref 3.5–5.1)
Sodium: 137 mmol/L (ref 135–145)

## 2022-01-24 LAB — GLUCOSE, CAPILLARY: Glucose-Capillary: 239 mg/dL — ABNORMAL HIGH (ref 70–99)

## 2022-01-24 MED ORDER — POTASSIUM CHLORIDE CRYS ER 10 MEQ PO TBCR: 10.0000 meq | EXTENDED_RELEASE_TABLET | Freq: Two times a day (BID) | ORAL | Status: DC

## 2022-01-24 MED ORDER — ATORVASTATIN CALCIUM 80 MG PO TABS: 80.0000 mg | ORAL_TABLET | Freq: Every day | ORAL | 3 refills | Status: DC

## 2022-01-24 MED ORDER — DAPAGLIFLOZIN PROPANEDIOL 10 MG PO TABS: 10.0000 mg | ORAL_TABLET | Freq: Every day | ORAL | 6 refills | Status: DC

## 2022-01-24 MED ORDER — POTASSIUM CHLORIDE CRYS ER 10 MEQ PO TBCR: 10.0000 meq | EXTENDED_RELEASE_TABLET | Freq: Two times a day (BID) | ORAL | 2 refills | Status: DC

## 2022-01-24 MED ORDER — SPIRONOLACTONE 25 MG PO TABS: 25.0000 mg | ORAL_TABLET | Freq: Every day | ORAL | 3 refills | Status: DC

## 2022-01-24 MED ORDER — ICOSAPENT ETHYL 1 G PO CAPS: 2.0000 g | ORAL_CAPSULE | Freq: Two times a day (BID) | ORAL | 3 refills | Status: DC

## 2022-01-24 MED ORDER — TORSEMIDE 20 MG PO TABS
50.0000 mg | ORAL_TABLET | Freq: Two times a day (BID) | ORAL | Status: DC
  Administered 2022-01-24: 50 mg via ORAL
  Filled 2022-01-24: qty 3

## 2022-01-24 MED ORDER — SACUBITRIL-VALSARTAN 24-26 MG PO TABS: 1.0000 | ORAL_TABLET | Freq: Two times a day (BID) | ORAL | 3 refills | Status: DC

## 2022-01-24 MED ORDER — POTASSIUM CHLORIDE CRYS ER 20 MEQ PO TBCR
40.0000 meq | EXTENDED_RELEASE_TABLET | Freq: Once | ORAL | Status: AC
  Administered 2022-01-24: 40 meq via ORAL
  Filled 2022-01-24: qty 2

## 2022-01-24 MED ORDER — ASPIRIN 81 MG PO TBEC: 81.0000 mg | DELAYED_RELEASE_TABLET | Freq: Every day | ORAL | 3 refills | Status: AC

## 2022-01-24 MED ORDER — TORSEMIDE 10 MG PO TABS: 50.0000 mg | ORAL_TABLET | Freq: Two times a day (BID) | ORAL | 3 refills | Status: DC

## 2022-01-24 MED ORDER — TICAGRELOR 90 MG PO TABS: 90.0000 mg | ORAL_TABLET | Freq: Two times a day (BID) | ORAL | 11 refills | Status: DC

## 2022-01-24 NOTE — Progress Notes (Signed)
Rounding Note    Patient Name: Phillip Maldonado Date of Encounter: 01/24/2022  [place] Cardiologist:  O'Neal   Subjective   71 year old gentleman with a history of morbid obesity, coronary artery disease, hyperlipidemia, hypertension.  Heart catheterization several days ago revealed an occluded obtuse marginal.  He is status post PCI.  He is not having any episodes of chest pain.  He was found to have an elevated LVEDP at the time of cath.  IV Lasix has been added.  Spironolactone has been added.  He is hypokalemic today.  We will give him potassium chloride 40 mill equivalents today with 10 mill equivalents p.o. twice daily.  He is also on spironolactone.  He will need a basic metabolic profile drawn within a week or so.    Inpatient Medications    Scheduled Meds:  amLODipine  5 mg Oral Daily   aspirin EC  81 mg Oral Daily   atorvastatin  80 mg Oral Daily   carvedilol  12.5 mg Oral BID   dapagliflozin propanediol  10 mg Oral Daily   DULoxetine  60 mg Oral Daily   enoxaparin (LOVENOX) injection  40 mg Subcutaneous Q24H   furosemide  40 mg Intravenous Q12H   icosapent Ethyl  2 g Oral BID   insulin aspart  0-15 Units Subcutaneous TID WC   insulin aspart  0-5 Units Subcutaneous QHS   losartan  50 mg Oral Daily   pantoprazole  40 mg Oral QAC breakfast   pregabalin  150 mg Oral TID   sacubitril-valsartan  1 tablet Oral BID   sodium chloride flush  3 mL Intravenous Q12H   spironolactone  25 mg Oral Daily   ticagrelor  90 mg Oral BID   trazodone  300 mg Oral QHS   Continuous Infusions:  sodium chloride 10 mL/hr at 01/22/22 0348   sodium chloride Stopped (01/22/22 1614)   sodium chloride     PRN Meds: sodium chloride, acetaminophen, Gerhardt's butt cream, ondansetron (ZOFRAN) IV, mouth rinse, oxyCODONE, sodium chloride flush   Vital Signs    Vitals:   01/23/22 1619 01/23/22 2053 01/24/22 0010 01/24/22 0457  BP: 130/71 135/75 121/66 (!) 151/71   Pulse: 73 69 70   Resp: '17 18 18 17  '$ Temp: 98.1 F (36.7 C) 98.2 F (36.8 C) 97.6 F (36.4 C) 97.8 F (36.6 C)  TempSrc: Oral Oral Oral Oral  SpO2: 93% 94%  94%  Weight:    (!) 181.9 kg  Height:        Intake/Output Summary (Last 24 hours) at 01/24/2022 0731 Last data filed at 01/23/2022 2230 Gross per 24 hour  Intake 757.13 ml  Output --  Net 757.13 ml      01/24/2022    4:57 AM 01/23/2022    5:24 AM 01/21/2022    4:40 PM  Last 3 Weights  Weight (lbs) 401 lb 0.3 oz 399 lb 11.2 oz 400 lb  Weight (kg) 181.9 kg 181.303 kg 181.439 kg      Telemetry      Personally Reviewed  ECG     - Personally Reviewed  Physical Exam    GEN:  morbidly obese male,  NAD .   Neck: No JVD Cardiac: RRR, no murmurs, rubs, or gallops.  Respiratory: Clear to auscultation bilaterally. GI: Soft, nontender, non-distended  MS: No edema;R radial cath site looks good , good pulses  Neuro:  Nonfocal  Psych: Normal affect   Labs    High Sensitivity  Troponin:   Recent Labs  Lab 01/21/22 1651 01/22/22 0030  TROPONINIHS 2,264* 6,155*     Chemistry Recent Labs  Lab 01/21/22 1651 01/22/22 0350 01/23/22 0154 01/23/22 1119  NA 142 140 141  --   K 3.9 3.6 3.2*  --   CL 100 100 98  --   CO2 '28 28 28  '$ --   GLUCOSE 174* 235* 204*  --   BUN '17 17 15  '$ --   CREATININE 1.14 1.04 1.15 1.15  CALCIUM 9.6 9.3 9.2  --   MG  --  1.7 1.6*  --   GFRNONAA >60 >60 >60 >60  ANIONGAP '14 12 15  '$ --     Lipids  Recent Labs  Lab 01/22/22 0030  CHOL 130  TRIG 335*  HDL 37*  LDLCALC 26  CHOLHDL 3.5    Hematology Recent Labs  Lab 01/22/22 0350 01/23/22 0154 01/23/22 1119  WBC 9.5 9.8 9.2  RBC 4.52 4.47 4.42  HGB 14.3 14.2 14.1  HCT 44.4 43.8 42.8  MCV 98.2 98.0 96.8  MCH 31.6 31.8 31.9  MCHC 32.2 32.4 32.9  RDW 14.2 14.6 14.4  PLT 214 188 186   Thyroid  Recent Labs  Lab 01/23/22 0154  TSH 1.433    BNP Recent Labs  Lab 01/21/22 1658  BNP 49.4    DDimer No results for  input(s): "DDIMER" in the last 168 hours.   Radiology    CARDIAC CATHETERIZATION  Result Date: 01/22/2022   Prox RCA lesion is 30% stenosed.   1st Mrg lesion is 100% stenosed.   Dist LAD lesion is 30% stenosed.   A drug-eluting stent was successfully placed using a SYNERGY XD 3.0X16.   Post intervention, there is a 0% residual stenosis. Mild non-obstructive disease in the mid and distal LAD Large Circumflex with occluded large first obtuse marginal branch. Successful PTCA/DES x 1 obtuse marginal branch Large dominant RCA with mild mid vessel stenosis LVEDP=24 mmHg Recommendations: Will continue DAPT with ASA and Brilinta for one year. Continue beta blocker and statin. I will give one dose of IV Lasix tonight.   ECHOCARDIOGRAM COMPLETE  Result Date: 01/22/2022    ECHOCARDIOGRAM REPORT   Patient Name:   Phillip Maldonado Date of Exam: 01/22/2022 Medical Rec #:  [id number]          Height:       68.0 in Accession #:    [id number]         Weight:       400.0 lb Date of Birth:  1950-09-21          BSA:          2.744 m Patient Age:    89 years           BP:           151/80 mmHg Patient Gender: M                  HR:           85 bpm. Exam Location:  Inpatient Procedure: 2D Echo, Intracardiac Opacification Agent, Cardiac Doppler and Color            Doppler Indications:    NSTEMI I21.4  History:        Patient has prior history of Echocardiogram examinations, most                 recent 01/22/2019. CHF, NSTEMI, Arrythmias:Atrial Fibrillation,  Signs/Symptoms:Shortness of Breath and Chest Pain; Risk                 Factors:Hypertension, Dyslipidemia, Sleep Apnea, Diabetes and                 Non-Smoker.  Sonographer:    Greer Pickerel Referring Phys: [id number] Goulds A SULEIMAN  Sonographer Comments: Technically difficult study due to poor echo windows, Technically challenging study due to limited acoustic windows, no subcostal window, patient is obese, suboptimal parasternal window and suboptimal apical  window. Image acquisition challenging due to patient body habitus and Image acquisition challenging due to respiratory motion. Patient could not roll or lie flat. IMPRESSIONS  1. Normal to low normal EF endocardium not well seen even with definity Cannot r/o lateral wall hypokinesis . Left ventricular ejection fraction, by estimation, is Not well seen%. The left ventricle has normal function. The left ventricle has no regional wall motion abnormalities. Left ventricular diastolic parameters are consistent with Grade I diastolic dysfunction (impaired relaxation).  2. Right ventricular systolic function was not well visualized. The right ventricular size is not well visualized. There is normal pulmonary artery systolic pressure.  3. The mitral valve is abnormal. Trivial mitral valve regurgitation. No evidence of mitral stenosis.  4. The aortic valve is tricuspid. Aortic valve regurgitation is not visualized. No aortic stenosis is present.  5. 4.3 cm my measurement . Aortic dilatation noted. There is moderate dilatation of the aortic root, measuring 43 mm.  6. The inferior vena cava is normal in size with greater than 50% respiratory variability, suggesting right atrial pressure of 3 mmHg. FINDINGS  Left Ventricle: Normal to low normal EF endocardium not well seen even with definity Cannot r/o lateral wall hypokinesis. Left ventricular ejection fraction, by estimation, is Not well seen%. The left ventricle has normal function. The left ventricle has no regional wall motion abnormalities. Definity contrast agent was given IV to delineate the left ventricular endocardial borders. The left ventricular internal cavity size was normal in size. There is no left ventricular hypertrophy. Left ventricular diastolic parameters are consistent with Grade I diastolic dysfunction (impaired relaxation). Right Ventricle: The right ventricular size is not well visualized. Right vetricular wall thickness was not assessed. Right  ventricular systolic function was not well visualized. There is normal pulmonary artery systolic pressure. The tricuspid regurgitant velocity is 1.14 m/s, and with an assumed right atrial pressure of 8 mmHg, the estimated right ventricular systolic pressure is 35.3 mmHg. Left Atrium: Left atrial size was normal in size. Right Atrium: Right atrial size was normal in size. Pericardium: There is no evidence of pericardial effusion. Mitral Valve: The mitral valve is abnormal. Mild mitral annular calcification. Trivial mitral valve regurgitation. No evidence of mitral valve stenosis. Tricuspid Valve: The tricuspid valve is normal in structure. Tricuspid valve regurgitation is not demonstrated. No evidence of tricuspid stenosis. Aortic Valve: The aortic valve is tricuspid. Aortic valve regurgitation is not visualized. No aortic stenosis is present. Pulmonic Valve: The pulmonic valve was normal in structure. Pulmonic valve regurgitation is not visualized. No evidence of pulmonic stenosis. Aorta: 4.3 cm my measurement. Aortic dilatation noted. There is moderate dilatation of the aortic root, measuring 43 mm. Venous: The inferior vena cava is normal in size with greater than 50% respiratory variability, suggesting right atrial pressure of 3 mmHg. IAS/Shunts: The interatrial septum was not well visualized.  LEFT VENTRICLE PLAX 2D LVOT diam:     2.50 cm   Diastology LV SV:  78        LV e' medial:    3.92 cm/s LV SV Index:   28        LV E/e' medial:  13.5 LVOT Area:     4.91 cm  LV e' lateral:   8.70 cm/s                          LV E/e' lateral: 6.1  RIGHT VENTRICLE RV S prime:     16.00 cm/s TAPSE (M-mode): 1.9 cm LEFT ATRIUM           Index        RIGHT ATRIUM           Index LA Vol (A4C): 97.5 ml 35.53 ml/m  RA Area:     16.20 cm                                    RA Volume:   41.10 ml  14.98 ml/m  AORTIC VALVE LVOT Vmax:   87.40 cm/s LVOT Vmean:  74.300 cm/s LVOT VTI:    0.159 m  AORTA Ao Root diam: 4.50 cm Ao  Asc diam:  4.30 cm MITRAL VALVE                TRICUSPID VALVE MV Area (PHT): 5.93 cm     TR Peak grad:   5.2 mmHg MV Decel Time: 128 msec     TR Vmax:        114.00 cm/s MR Peak grad: 4.3 mmHg MR Mean grad: 3.0 mmHg      SHUNTS MR Vmax:      104.00 cm/s   Systemic VTI:  0.16 m MR Vmean:     85.6 cm/s     Systemic Diam: 2.50 cm MV E velocity: 52.90 cm/s MV A velocity: 103.00 cm/s MV E/A ratio:  0.51 Jenkins Rouge MD Electronically signed by Jenkins Rouge MD Signature Date/Time: 01/22/2022/12:08:00 PM    Final     Cardiac Studies      Patient Profile     71 y.o. male  with morbid obesity, CAD , chronic diastoic chf   Assessment & Plan    1.  Chronic diastolic congestive heart failure. He has morbid obesity.  He tells like he eats a fairly unlimited diet.  Strongly encouraged him to work on eliminating salt and preserved meats out of his diet.  We will stop the IV Lasix.  Started on torsemide 50 mg twice a day.  He is on spironolactone 25 mg a day.  We will give him potassium chloride 10 mill equivalents twice a day.  He will need a basic metabolic profile within the week.  He is on  Coreg, Farxiga, Losartan, torsemide, Spironolactone For his CHF    2.  Coronary artery disease: He status post stenting of an obtuse marginal.  He is not having any angina. Cont ASA 81 mg a day , Brilinta 90 BID   3.  Morbid obesity :   advised continued weight loss .    For questions or updates, please contact [place] Please consult [email or web address] for contact info under        Signed, Mertie Moores, MD  01/24/2022, 7:31 AM

## 2022-01-24 NOTE — Progress Notes (Signed)
[facility] notified of dc order. Patient and wife oriented , verbalized understanding of dc instructions. All belongings given to patient and wife.

## 2022-01-24 NOTE — Progress Notes (Signed)
CARDIAC REHAB PHASE I        Pt feeling well this morning. Reviewed education provided yesterday. All questions and concerns addressed. Plan for home today. Pt is eager to begin [facility] program.    [phone]  Vanessa Barbara, RN BSN 01/24/2022 9:40 AM

## 2022-01-24 NOTE — Discharge Summary (Cosign Needed Addendum)
Discharge Summary    Patient ID: Phillip Maldonado MRN: [id number]; DOB: 07/09/1950  Admit date: 01/21/2022 Discharge date: 01/24/2022  PCP:  Phillip Maldonado, [facility] Providers Cardiologist: New to Dr. Audie Box     Discharge Diagnoses    Principal Problem:   NSTEMI (non-ST elevated myocardial infarction) [facility]) Active Problems:   HTN (hypertension)   Hypercholesterolemia   OSA (obstructive sleep apnea)   Atrial fibrillation with RVR ([place])   Congestive heart disease (HCC)   BMI 50.0-59.9, adult ([place])   Acute on chronic heart failure with preserved ejection fraction (HFpEF) ([place])   NSTEMI (non-ST elevation myocardial infarction) [facility])    Diagnostic Studies/Procedures    LHC on 01/22/22:  Fluoro time: 20.6 (min) DAP: 841660 (mGycm2) Cumulative Air Kerma: 2165 (mGy)    Prox RCA lesion is 30% stenosed.   1st Mrg lesion is 100% stenosed.   Dist LAD lesion is 30% stenosed.   A drug-eluting stent was successfully placed using a SYNERGY XD 3.0X16.   Post intervention, there is a 0% residual stenosis.   Mild non-obstructive disease in the mid and distal LAD Large Circumflex with occluded large first obtuse marginal branch.  Successful PTCA/DES x 1 obtuse marginal branch Large dominant RCA with mild mid vessel stenosis LVEDP=24 mmHg   Recommendations: Will continue DAPT with ASA and Brilinta for one year. Continue beta blocker and statin. I will give one dose of IV Lasix tonight.    Diagnostic Dominance: Right  Intervention    Echo from 01/22/22:  1. Normal to low normal EF endocardium not well seen even with definity  Cannot r/o lateral wall hypokinesis . Left ventricular ejection fraction,  by estimation, is Not well seen%. The left ventricle has normal function.  The left ventricle has no  regional wall motion abnormalities. Left ventricular diastolic parameters  are consistent with Grade I diastolic dysfunction (impaired relaxation).    2. Right ventricular systolic function was not well visualized. The right  ventricular size is not well visualized. There is normal pulmonary artery  systolic pressure.   3. The mitral valve is abnormal. Trivial mitral valve regurgitation. No  evidence of mitral stenosis.   4. The aortic valve is tricuspid. Aortic valve regurgitation is not  visualized. No aortic stenosis is present.   5. 4.3 cm my measurement . Aortic dilatation noted. There is moderate  dilatation of the aortic root, measuring 43 mm.   6. The inferior vena cava is normal in size with greater than 50%  respiratory variability, suggesting right atrial pressure of 3 mmHg.     History of Present Illness      Per admission H&P on 01/22/22:   Phillip Maldonado is a 71 y.o. male with HFpEF, type 2 diabetes, hypertension, hyperlipidemia, obesity who was seen 01/22/2022 for the evaluation of chest pain.   [name] reported waking up at 4:30 AM 01/21/22 morning with acute onset substernal deep chest discomfort that radiated to the left side of the chest as well as the right jaw.  He reported sitting up from bed and waiting for the chest pain to subside but it persisted for roughly 2 hours.  After roughly 2 hours the chest pain began to subside and he was feeling slightly better.  Later on in the day his chest pain returned and he ultimately decided to present to the emergency department for evaluation.  At the time of admission evaluation his chest pain was 8 out of 10.  He reported no history of acute coronary syndrome similar chest pain.   He reported a few weeks of worsening lower extremity edema, orthopnea, and dyspnea with exertion.  He reported taking his home diuretic although did miss his dose on Wednesday.    Hospital Course     Consultants: N/A   Non-STEMI - presented with chest pain - Hs trop 2264 >6155 - EKG with sinus rhythm, right bundle branch block and left anterior fascicular block  - TSH WNL; A1C 8.1%;  LDL 26, tri 335 - LHC with Large Circumflex with occluded large first OM, successful PTCA/DES x 1 OM; LVEDP 24 mmHg - Echo with LVEF low normal  - Medical therapy: continue aspirin and Brilinta for 1 year; continue coreg 12.54m BID; Continue lipitor 829mdaily; added Vascepa 2g BID this admission. [place]  - Hold metformin 48 hours post cath, resume on 11/12 - post cath care discussed  - follow up arranged on 02/11/22, follow up with HF impact clinic on 02/02/22   Acute HFmrEF -LVEDP elevated 24 mmHg on LHC and Echo with low normal LVEF/see report.   -diuresed with IV Lasix here, recommended transition to  torsemide 50 mg BID today per Dr NaAcie Fredricksonappears was on torsemide 10067maily PTA) - GDMT: continue PTA coreg; started Farxiga 57m57mAldactone 25 mg, Entresto 24/26mg57m this admission; will stop PTA Losartan today; new script sent to [facility], confirmed they have supply for entresto; will need BMP check at next follow up appointment with HF impact clinic  - low Na diet, weight loss measures  Hypertension -discharge on regimen including Coreg 12.5 mg twice daily, amlodipine 5 mg daily, Aldactone 25 mg daily, Entresto 24/26mg 19m and torsemide 50mg B46m trend BP, follow up at office    DM - A1C 8.1%, resume metformin 11/12, continue PTA lantus and victoza, follow up with PCP   Chronic back pain Morbid obesity OSA GERD - no change of chronic meds      Did the patient have an acute coronary syndrome (MI, NSTEMI, STEMI, etc) this admission?:  Yes                               AHA/ACC Clinical Performance & Quality Measures: Aspirin prescribed? - Yes ADP Receptor Inhibitor (Plavix/Clopidogrel, Brilinta/Ticagrelor or Effient/Prasugrel) prescribed (includes medically managed patients)? - Yes Beta Blocker prescribed? - Yes High Intensity Statin (Lipitor 40-80mg or36mstor 20-40mg) pr66mibed? -  Yes EF assessed during THIS hospitalization? - Yes For EF <40%, was ACEI/ARB prescribed? - Yes For EF <40%, Aldosterone Antagonist (Spironolactone or Eplerenone) prescribed? - Yes Cardiac Rehab Phase II ordered (including medically managed patients)? - Yes       The patient will be scheduled for a TOC follow up appointment in 9 days.    Discharge Vitals Blood pressure (!) 151/71, pulse 66, temperature 97.8 F (36.6 C), temperature source Oral, resp. rate 17, height _0  (1.727 m), weight (!) 181.9 kg, SpO2 91 %.  Filed Weights   01/21/22 1640 01/23/22 0524 01/24/22 0457  Weight: (!) 181.4 kg (!) 181.3 kg (!) 181.9 kg   See attending rounding note for exam   Labs & Radiologic Studies    CBC Recent Labs    01/23/22 0154 01/23/22 1119  WBC 9.8 9.2  HGB 14.2 14.1  HCT 43.8 42.8  MCV 98.0 96.8  PLT 188  976   Basic Metabolic Panel Recent Labs    01/22/22 0350 01/23/22 0154 01/23/22 1119 01/24/22 0807  NA 140 141  --  137  K 3.6 3.2*  --  3.8  CL 100 98  --  98  CO2 28 28  --  24  GLUCOSE 235* 204*  --  293*  BUN 17 15  --  21  CREATININE 1.04 1.15 1.15 1.24  CALCIUM 9.3 9.2  --  8.9  MG 1.7 1.6*  --   --    Liver Function Tests No results for input(s): "AST", "ALT", "ALKPHOS", "BILITOT", "PROT", "ALBUMIN" in the last 72 hours. No results for input(s): "LIPASE", "AMYLASE" in the last 72 hours. High Sensitivity Troponin:   Recent Labs  Lab 01/21/22 1651 01/22/22 0030  TROPONINIHS 2,264* 6,155*    BNP Invalid input(s): "POCBNP" D-Dimer No results for input(s): "DDIMER" in the last 72 hours. Hemoglobin A1C Recent Labs    01/22/22 0030  HGBA1C 8.1*   Fasting Lipid Panel Recent Labs    01/22/22 0030  CHOL 130  HDL 37*  LDLCALC 26  TRIG 335*  CHOLHDL 3.5   Thyroid Function Tests Recent Labs    01/23/22 0154  TSH 1.433   _____________  CARDIAC CATHETERIZATION  Result Date: 01/22/2022   Prox RCA lesion is 30% stenosed.   1st Mrg lesion is  100% stenosed.   Dist LAD lesion is 30% stenosed.   A drug-eluting stent was successfully placed using a SYNERGY XD 3.0X16.   Post intervention, there is a 0% residual stenosis. Mild non-obstructive disease in the mid and distal LAD Large Circumflex with occluded large first obtuse marginal branch. Successful PTCA/DES x 1 obtuse marginal branch Large dominant RCA with mild mid vessel stenosis LVEDP=24 mmHg Recommendations: Will continue DAPT with ASA and Brilinta for one year. Continue beta blocker and statin. I will give one dose of IV Lasix tonight.   ECHOCARDIOGRAM COMPLETE  Result Date: 01/22/2022    ECHOCARDIOGRAM REPORT   Patient Name:   Phillip Maldonado Date of Exam: 01/22/2022 Medical Rec #:  [id number]          Height:       68.0 in Accession #:    [id number]         Weight:       400.0 lb Date of Birth:  [birth year]          BSA:          2.744 m Patient Age:    52 years           BP:           151/80 mmHg Patient Gender: M                  HR:           85 bpm. Exam Location:  Inpatient Procedure: 2D Echo, Intracardiac Opacification Agent, Cardiac Doppler and Color            Doppler Indications:    NSTEMI I21.4  History:        Patient has prior history of Echocardiogram examinations, most                 recent 01/22/2019. CHF, NSTEMI, Arrythmias:Atrial Fibrillation,                 Signs/Symptoms:Shortness of Breath and Chest Pain; Risk  Factors:Hypertension, Dyslipidemia, Sleep Apnea, Diabetes and                 Non-Smoker.  Sonographer:    Greer Pickerel Referring Phys: [id number] [place] A SULEIMAN  Sonographer Comments: Technically difficult study due to poor echo windows, Technically challenging study due to limited acoustic windows, no subcostal window, patient is obese, suboptimal parasternal window and suboptimal apical window. Image acquisition challenging due to patient body habitus and Image acquisition challenging due to respiratory motion. Patient could not roll or lie flat.  IMPRESSIONS  1. Normal to low normal EF endocardium not well seen even with definity Cannot r/o lateral wall hypokinesis . Left ventricular ejection fraction, by estimation, is Not well seen%. The left ventricle has normal function. The left ventricle has no regional wall motion abnormalities. Left ventricular diastolic parameters are consistent with Grade I diastolic dysfunction (impaired relaxation).  2. Right ventricular systolic function was not well visualized. The right ventricular size is not well visualized. There is normal pulmonary artery systolic pressure.  3. The mitral valve is abnormal. Trivial mitral valve regurgitation. No evidence of mitral stenosis.  4. The aortic valve is tricuspid. Aortic valve regurgitation is not visualized. No aortic stenosis is present.  5. 4.3 cm my measurement . Aortic dilatation noted. There is moderate dilatation of the aortic root, measuring 43 mm.  6. The inferior vena cava is normal in size with greater than 50% respiratory variability, suggesting right atrial pressure of 3 mmHg. FINDINGS  Left Ventricle: Normal to low normal EF endocardium not well seen even with definity Cannot r/o lateral wall hypokinesis. Left ventricular ejection fraction, by estimation, is Not well seen%. The left ventricle has normal function. The left ventricle has no regional wall motion abnormalities. Definity contrast agent was given IV to delineate the left ventricular endocardial borders. The left ventricular internal cavity size was normal in size. There is no left ventricular hypertrophy. Left ventricular diastolic parameters are consistent with Grade I diastolic dysfunction (impaired relaxation). Right Ventricle: The right ventricular size is not well visualized. Right vetricular wall thickness was not assessed. Right ventricular systolic function was not well visualized. There is normal pulmonary artery systolic pressure. The tricuspid regurgitant velocity is 1.14 m/s, and with an  assumed right atrial pressure of 8 mmHg, the estimated right ventricular systolic pressure is 22.5 mmHg. Left Atrium: Left atrial size was normal in size. Right Atrium: Right atrial size was normal in size. Pericardium: There is no evidence of pericardial effusion. Mitral Valve: The mitral valve is abnormal. Mild mitral annular calcification. Trivial mitral valve regurgitation. No evidence of mitral valve stenosis. Tricuspid Valve: The tricuspid valve is normal in structure. Tricuspid valve regurgitation is not demonstrated. No evidence of tricuspid stenosis. Aortic Valve: The aortic valve is tricuspid. Aortic valve regurgitation is not visualized. No aortic stenosis is present. Pulmonic Valve: The pulmonic valve was normal in structure. Pulmonic valve regurgitation is not visualized. No evidence of pulmonic stenosis. Aorta: 4.3 cm my measurement. Aortic dilatation noted. There is moderate dilatation of the aortic root, measuring 43 mm. Venous: The inferior vena cava is normal in size with greater than 50% respiratory variability, suggesting right atrial pressure of 3 mmHg. IAS/Shunts: The interatrial septum was not well visualized.  LEFT VENTRICLE PLAX 2D LVOT diam:     2.50 cm   Diastology LV SV:         78        LV e' medial:    3.92 cm/s LV SV Index:  28        LV E/e' medial:  13.5 LVOT Area:     4.91 cm  LV e' lateral:   8.70 cm/s                          LV E/e' lateral: 6.1  RIGHT VENTRICLE RV S prime:     16.00 cm/s TAPSE (M-mode): 1.9 cm LEFT ATRIUM           Index        RIGHT ATRIUM           Index LA Vol (A4C): 97.5 ml 35.53 ml/m  RA Area:     16.20 cm                                    RA Volume:   41.10 ml  14.98 ml/m  AORTIC VALVE LVOT Vmax:   87.40 cm/s LVOT Vmean:  74.300 cm/s LVOT VTI:    0.159 m  AORTA Ao Root diam: 4.50 cm Ao Asc diam:  4.30 cm MITRAL VALVE                TRICUSPID VALVE MV Area (PHT): 5.93 cm     TR Peak grad:   5.2 mmHg MV Decel Time: 128 msec     TR Vmax:        114.00  cm/s MR Peak grad: 4.3 mmHg MR Mean grad: 3.0 mmHg      SHUNTS MR Vmax:      104.00 cm/s   Systemic VTI:  0.16 m MR Vmean:     85.6 cm/s     Systemic Diam: 2.50 cm MV E velocity: 52.90 cm/s MV A velocity: 103.00 cm/s MV E/A ratio:  0.51 Jenkins Rouge MD Electronically signed by Jenkins Rouge MD Signature Date/Time: 01/22/2022/12:08:00 PM    Final    DG Chest 1 View  Result Date: 01/21/2022 CLINICAL DATA:  Chest pain EXAM: CHEST  1 VIEW COMPARISON:  01/25/2019 FINDINGS: The patient's head and chin obscure parts of the apices. Streaky atelectasis or scarring left base. No focal consolidation or effusion. Stable cardiomediastinal silhouette with aortic atherosclerosis. No pneumothorax. Multiple clips in the left upper quadrant IMPRESSION: No active disease. Streaky atelectasis or scarring at the left base. Electronically Signed   By: Donavan Foil M.D.   On: 01/21/2022 17:14   Disposition   Patient is seen by Dr Acie Fredrickson today and deemed stable for discharge. He is agreeable with medication change and follow up plan. Discharge instruction reviewed. Pt is being discharged home today in good condition.  Follow-up Plans & Appointments     Follow-up Information     [facility] Follow up on 02/02/2022.   Specialty: Cardiovascular Intensive Care Why: _0  for follow up Contact information: [address] [id number] [place] [address] [phone]        Warren Lacy, PA-C Follow up on 02/11/2022.   Specialty: Internal Medicine Why: at 8:25am for cardiology follow up appointment Contact information: [address] [place] [address] [phone]                Discharge Instructions     Amb Referral to Cardiac Rehabilitation   Complete by: As directed    Diagnosis:  Coronary Stents NSTEMI     After initial evaluation and assessments completed: Virtual  Based Care may be provided alone or in conjunction with Phase 2  Cardiac Rehab based on patient barriers.: Yes   Intensive Cardiac Rehabilitation (ICR) [place] location only OR Traditional Cardiac Rehabilitation (TCR) *If criteria for ICR are not met will enroll in TCR [facility] only): Yes   Diet - low sodium heart healthy   Complete by: As directed    Discharge instructions   Complete by: As directed    [place].  PLEASE ATTEND ALL SCHEDULED FOLLOW-UP APPOINTMENTS.   Activity: Increase activity slowly as tolerated. You may shower, but no soaking baths (or swimming) for 1 week. No driving for 24 hours. No lifting over 5 lbs for 1 week. No sexual activity for 1 week.   You May Return to Work: in 1 week (if applicable)  Wound Care: You may wash cath site gently with soap and water. Keep cath site clean and dry. If you notice pain, swelling, bleeding or pus at your cath site, please call [phone].   PLEASE DO NOT MISS ANY DOSES OF YOUR ASPIRIN/BRILINTA!!!!!   Also keep a log of you blood pressures and bring back to your follow up appt. Please call the office with any questions.   Patients taking blood thinners should generally stay away from medicines like ibuprofen, Advil, Motrin, naproxen, and Aleve due to risk of stomach bleeding. You may take Tylenol as directed or talk to your primary doctor about alternatives.  PLEASE ENSURE THAT YOU DO NOT RUN OUT OF YOUR ASPIRIN/BRILINTA. This medication is very important to remain on for at least one year. IF you have issues obtaining this medication due to cost please CALL the office 3-5 business days prior to running out in order to prevent missing doses of this medication.    Radial Site Care  Refer to this sheet in the next few weeks. These instructions provide you with information on caring for yourself after your procedure. Your caregiver may also give you more specific instructions. Your treatment has been planned according to  current medical practices, but problems sometimes occur. Call your caregiver if you have any problems or questions after your procedure.  HOME CARE INSTRUCTIONS You may shower the day after the procedure. Remove the bandage (dressing) and gently wash the site with plain soap and water. Gently pat the site dry.  Do not apply powder or lotion to the site.  Do not submerge the affected site in water for 3 to 5 days.  Inspect the site at least twice daily.  Do not flex or bend the affected arm for 24 hours.  No lifting over 5 pounds (2.3 kg) for 5 days after your procedure.  Do not drive home if you are discharged the same day of the procedure. Have someone else drive you.  You may drive 24 hours after the procedure unless otherwise instructed by your caregiver.   What to expect: Any bruising will usually fade within 1 to 2 weeks.  Blood that collects in the tissue (hematoma) may be painful to the touch. It should usually decrease in size and tenderness within 1 to 2 weeks.   SEEK IMMEDIATE MEDICAL CARE IF: You have unusual pain at the radial site.  You have redness, warmth, swelling, or pain at the radial site.  You have drainage (other than a small amount of blood on the dressing).  You have chills.  You have a fever or persistent symptoms for more  than 72 hours.  You have a fever and your symptoms suddenly get worse.  Your arm becomes pale, cool, tingly, or numb.  You have heavy bleeding from the site. Hold pressure on the site.   Increase activity slowly   Complete by: As directed         Discharge Medications   Allergies as of 01/24/2022       Reactions   Chlorzoxazone Other (See Comments)   Other reaction(s): Angioedema (ALLERGY/intolerance)   Piroxicam Shortness Of Breath, Swelling   Tongue   Latex Other (See Comments), Rash   Rash and itching Other reaction(s): Other (See Comments) Burning Feeling   Adhesive [tape] Other (See Comments)   welts Unknown paper tape ok  to use         Medication List     STOP taking these medications    acetaminophen 500 MG tablet Commonly known as: TYLENOL   losartan 50 MG tablet Commonly known as: COZAAR   sildenafil 50 MG tablet Commonly known as: VIAGRA       TAKE these medications    Accu-Chek Aviva Plus test strip Generic drug: glucose blood 1 each by Other route See admin instructions. Check blood sugar twice daily   amLODipine 5 MG tablet Commonly known as: NORVASC Take 5 mg by mouth daily.   aspirin EC 81 MG tablet Take 1 tablet (81 mg total) by mouth daily. Swallow whole.   atorvastatin 80 MG tablet Commonly known as: LIPITOR Take 1 tablet (80 mg total) by mouth daily. Start taking on: January 25, 2022 What changed:  medication strength how much to take   carvedilol 12.5 MG tablet Commonly known as: COREG Take 12.5 mg by mouth 2 (two) times daily.   Centrum Silver 50+Men Tabs Take 1 tablet by mouth daily.   cholecalciferol 25 MCG (1000 UNIT) tablet Commonly known as: VITAMIN D3 Take 1,000 Units by mouth daily.   dapagliflozin propanediol 10 MG Tabs tablet Commonly known as: FARXIGA Take 1 tablet (10 mg total) by mouth daily.   DULoxetine 60 MG capsule Commonly known as: CYMBALTA Take 1 capsule (60 mg total) by mouth daily.   icosapent Ethyl 1 g capsule Commonly known as: VASCEPA Take 2 capsules (2 g total) by mouth 2 (two) times daily.   insulin glargine 100 UNIT/ML injection Commonly known as: LANTUS Inject 0.2 mLs (20 Units total) into the skin daily at 10 pm. What changed:  how much to take when to take this   melatonin 3 MG Tabs tablet Take 3 mg by mouth at bedtime.   metFORMIN 1000 MG tablet Commonly known as: GLUCOPHAGE Take 1,000 mg by mouth 2 (two) times daily.   pantoprazole 40 MG tablet Commonly known as: PROTONIX Take 1 tablet (40 mg total) by mouth daily before breakfast.   potassium chloride 10 MEQ tablet Commonly known as: KLOR-CON M Take  1 tablet (10 mEq total) by mouth 2 (two) times daily.   pregabalin 200 MG capsule Commonly known as: LYRICA Take 200 mg by mouth 3 (three) times daily.   sacubitril-valsartan 24-26 MG Commonly known as: ENTRESTO Take 1 tablet by mouth 2 (two) times daily.   silver sulfADIAZINE 1 % cream Commonly known as: SILVADENE Apply 1 Application topically 2 (two) times daily. Mix at home 1:1 with triamcinolone and apply 2 times a day to upper back, chest, under breast, groin areas and legs.   spironolactone 25 MG tablet Commonly known as: ALDACTONE Take 1 tablet (25 mg total)  by mouth daily.   ticagrelor 90 MG Tabs tablet Commonly known as: BRILINTA Take 1 tablet (90 mg total) by mouth 2 (two) times daily.   torsemide 10 MG tablet Commonly known as: DEMADEX Take 5 tablets (50 mg total) by mouth 2 (two) times daily. What changed:  medication strength how much to take when to take this   trazodone 300 MG tablet Commonly known as: DESYREL Take 1 tablet (300 mg total) by mouth at bedtime.   triamcinolone cream 0.1 % Commonly known as: KENALOG Apply 1 Application topically 2 (two) times daily. Mix at home 1:1 with triamcinolone and apply 2 times a day to upper back, chest, under breast, groin areas and legs.   Victoza 18 MG/3ML Sopn Generic drug: liraglutide Inject 1.8 mg into the skin daily.           Outstanding Labs/Studies    Duration of Discharge Encounter   Greater than 30 minutes including physician time.  Signed, Margie Billet, NP 01/24/2022, 10:05 AM   Attending Note:   The patient was seen and examined.  Agree with assessment and plan as noted above.  Changes made to the above note as needed.  Patient seen and independently examined with Margie Billet, PA.   We discussed all aspects of the encounter. I agree with the assessment and plan as stated above.     1.  Chronic diastolic congestive heart failure. He has morbid obesity.  He tells like he eats a fairly  unlimited diet.  Strongly encouraged him to work on eliminating salt and preserved meats out of his diet.   We will stop the IV Lasix.  Started on torsemide 50 mg twice a day.  He is on spironolactone 25 mg a day.  We will give him potassium chloride 10 mill equivalents twice a day.  He will need a basic metabolic profile within the week.   He is on  Coreg, Farxiga, Losartan, torsemide, Spironolactone For his CHF      2.  Coronary artery disease: He status post stenting of an obtuse marginal.  He is not having any angina. Cont ASA 81 mg a day , Brilinta 90 BID    3.  Morbid obesity :   advised continued weight loss .     I have spent a total of 40 minutes with patient reviewing hospital  notes , telemetry, EKGs, labs and examining patient as well as establishing an assessment and plan that was discussed with the patient.  > 50% of time was spent in direct patient care.    Thayer Headings, Brooke Bonito., MD, [facility] 01/25/2022, 6:16 AM [address],  [place] Pager [phone]

## 2022-01-25 DIAGNOSIS — I5043 Acute on chronic combined systolic (congestive) and diastolic (congestive) heart failure: Secondary | ICD-10-CM

## 2022-02-02 ENCOUNTER — Ambulatory Visit (HOSPITAL_COMMUNITY)
Admit: 2022-02-02 | Discharge: 2022-02-02 | Disposition: A | Discharge status: Home / Self Care | Payer: Medicare Other | Attending: Cardiology | Admitting: Cardiology

## 2022-02-02 ENCOUNTER — Telehealth (HOSPITAL_COMMUNITY): Payer: Self-pay | Admitting: *Deleted

## 2022-02-02 VITALS — BP 110/80 | HR 74 | Wt 389.4 lb

## 2022-02-02 DIAGNOSIS — Z9861 Coronary angioplasty status: Secondary | ICD-10-CM

## 2022-02-02 DIAGNOSIS — I2510 Atherosclerotic heart disease of native coronary artery without angina pectoris: Secondary | ICD-10-CM | POA: Insufficient documentation

## 2022-02-02 DIAGNOSIS — E785 Hyperlipidemia, unspecified: Secondary | ICD-10-CM | POA: Insufficient documentation

## 2022-02-02 DIAGNOSIS — I252 Old myocardial infarction: Secondary | ICD-10-CM | POA: Insufficient documentation

## 2022-02-02 DIAGNOSIS — I5032 Chronic diastolic (congestive) heart failure: Secondary | ICD-10-CM | POA: Diagnosis not present

## 2022-02-02 DIAGNOSIS — Z794 Long term (current) use of insulin: Secondary | ICD-10-CM | POA: Insufficient documentation

## 2022-02-02 DIAGNOSIS — Z7902 Long term (current) use of antithrombotics/antiplatelets: Secondary | ICD-10-CM | POA: Insufficient documentation

## 2022-02-02 DIAGNOSIS — I130 Hypertensive heart and chronic kidney disease with heart failure and stage 1 through stage 4 chronic kidney disease, or unspecified chronic kidney disease: Secondary | ICD-10-CM | POA: Diagnosis present

## 2022-02-02 DIAGNOSIS — E1122 Type 2 diabetes mellitus with diabetic chronic kidney disease: Secondary | ICD-10-CM | POA: Insufficient documentation

## 2022-02-02 DIAGNOSIS — R634 Abnormal weight loss: Secondary | ICD-10-CM | POA: Insufficient documentation

## 2022-02-02 DIAGNOSIS — Z79899 Other long term (current) drug therapy: Secondary | ICD-10-CM | POA: Insufficient documentation

## 2022-02-02 LAB — BASIC METABOLIC PANEL
Anion gap: 13 (ref 5–15)
BUN: 22 mg/dL (ref 8–23)
CO2: 23 mmol/L (ref 22–32)
Calcium: 9.7 mg/dL (ref 8.9–10.3)
Chloride: 104 mmol/L (ref 98–111)
Creatinine, Ser: 1.4 mg/dL — ABNORMAL HIGH (ref 0.61–1.24)
GFR, Estimated: 54 mL/min — ABNORMAL LOW (ref >60)
Glucose, Bld: 152 mg/dL — ABNORMAL HIGH (ref 70–99)
Potassium: 4 mmol/L (ref 3.5–5.1)
Sodium: 140 mmol/L (ref 135–145)

## 2022-02-02 LAB — BRAIN NATRIURETIC PEPTIDE: B Natriuretic Peptide: 99.4 pg/mL (ref 0.0–100.0)

## 2022-02-02 MED ORDER — NITROGLYCERIN 0.4 MG SL SUBL: 0.4000 mg | SUBLINGUAL_TABLET | SUBLINGUAL | 2 refills | Status: DC | PRN

## 2022-02-02 NOTE — Patient Instructions (Addendum)
EKG done today.  Labs done today. We will contact you only if your labs are abnormal.  Nitroglycerin has been sent into the pharmacy for you.   No other medication changes were made. Please continue all current medications as prescribed.  Thank you for allowing [place] to provide your heart failure care after your recent hospitalization. Please follow-up with General Cardiology.

## 2022-02-02 NOTE — Progress Notes (Signed)
HEART & VASCULAR TRANSITION OF CARE CONSULT NOTE     Referring Physician: Dr. Acie Fredrickson Primary Care: Saner, Janean Sark, MD Primary Cardiologist: Dr. Audie Box    HPI: Referred to clinic by Dr. Acie Fredrickson for heart failure consultation.   71 y/o male w/ HFpEF, type 2 diabetes, hypertension and hyperlipidemia admitted 11/23 w/ NSTEMI. Echo low normal LVEF, ~50%. RV not well visualized. Cath w/ 100% occluded 1st marginal off large LCx (culprit), treated w/ PCI + DES. Only mild distal LAD and mild prox RCA disease. LVEDP elevated on cath, 24 mmgh. Diuresed w/ IV lasix. Transitioned to PO torsemide and GDMT initiated. Also of note, Hgb A1c 8.1, Farxiga added. LDL 26. TG 335 (started on Vascepa + atorva 80). Referred to [facility] clinic at discharge. D/w wt 401 lb.   Presents today for follow-up. Here w/ wife. Reports doing well. No CP. Denies resting dyspnea. No orthopnea/PND. Still w/ mild DOE, NYHA Class II, c/w prior baseline.   Wt way down, from 401>>389 lb in clinic today. ? Accuracy of charted discharge wt.   Reports full med compliance. Tolerating well w/o side effects. BP 110/80. EKG NSR w/ 1st deg AVB.    Cardiac Testing  2D Echo 01/22/22 Normal to low normal EF endocardium not well seen even with definity Cannot r/o lateral wall hypokinesis . Left ventricular ejection fraction, by estimation, is Not well seen%. The left ventricle has normal function. The left ventricle has no regional wall motion abnormalities. Left ventricular diastolic parameters are consistent with Grade I diastolic dysfunction (impaired relaxation). 1. Right ventricular systolic function was not well visualized. The right ventricular size is not well visualized. There is normal pulmonary artery systolic pressure. 2. The mitral valve is abnormal. Trivial mitral valve regurgitation. No evidence of mitral stenosis. 3. The aortic valve is tricuspid. Aortic valve regurgitation is not visualized. No aortic stenosis is  present. 4. 4.3 cm my measurement . Aortic dilatation noted. There is moderate dilatation of the aortic root, measuring 43 mm. 5. The inferior vena cava is normal in size with greater than 50% respiratory variability, suggesting right atrial pressure of 3 mmHg.    LHC 01/22/22    Prox RCA lesion is 30% stenosed.   1st Mrg lesion is 100% stenosed.   Dist LAD lesion is 30% stenosed.   A drug-eluting stent was successfully placed using a SYNERGY XD 3.0X16.   Post intervention, there is a 0% residual stenosis.   Mild non-obstructive disease in the mid and distal LAD Large Circumflex with occluded large first obtuse marginal branch.  Successful PTCA/DES x 1 obtuse marginal branch Large dominant RCA with mild mid vessel stenosis LVEDP=24 mmHg   Recommendations: Will continue DAPT with ASA and Brilinta for one year. Continue beta blocker and statin. I will give one dose of IV Lasix tonight.     Review of Systems: [y] = yes, '[ ]'$  = no   General: Weight gain '[ ]'$ ; Weight loss '[ ]'$ ; Anorexia '[ ]'$ ; Fatigue '[ ]'$ ; Fever '[ ]'$ ; Chills '[ ]'$ ; Weakness '[ ]'$   Cardiac: Chest pain/pressure '[ ]'$ ; Resting SOB '[ ]'$ ; Exertional SOB '[ ]'$ ; Orthopnea '[ ]'$ ; Pedal Edema '[ ]'$ ; Palpitations '[ ]'$ ; Syncope '[ ]'$ ; Presyncope '[ ]'$ ; Paroxysmal nocturnal dyspnea'[ ]'$   Pulmonary: Cough '[ ]'$ ; Wheezing'[ ]'$ ; Hemoptysis'[ ]'$ ; Sputum '[ ]'$ ; Snoring '[ ]'$   GI: Vomiting'[ ]'$ ; Dysphagia'[ ]'$ ; Melena'[ ]'$ ; Hematochezia '[ ]'$ ; Heartburn'[ ]'$ ; Abdominal pain '[ ]'$ ; Constipation '[ ]'$ ; Diarrhea '[ ]'$ ; BRBPR '[ ]'$   GU: Hematuria'[ ]'$ ; Dysuria '[ ]'$ ; Nocturia'[ ]'$   Vascular: Pain in legs with walking '[ ]'$ ; Pain in feet with lying flat '[ ]'$ ; Non-healing sores '[ ]'$ ; Stroke '[ ]'$ ; TIA '[ ]'$ ; Slurred speech '[ ]'$ ;  Neuro: Headaches'[ ]'$ ; Vertigo'[ ]'$ ; Seizures'[ ]'$ ; Paresthesias'[ ]'$ ;Blurred vision '[ ]'$ ; Diplopia '[ ]'$ ; Vision changes '[ ]'$   Ortho/Skin: Arthritis '[ ]'$ ; Joint pain '[ ]'$ ; Muscle pain '[ ]'$ ; Joint swelling '[ ]'$ ; Back Pain '[ ]'$ ; Rash '[ ]'$   Psych: Depression'[ ]'$ ; Anxiety'[ ]'$   Heme: Bleeding problems '[ ]'$ ;  Clotting disorders '[ ]'$ ; Anemia '[ ]'$   Endocrine: Diabetes [ Y]; Thyroid dysfunction'[ ]'$    Past Medical History:  Diagnosis Date   Anemia    Angina    Anxiety    Arthritis    Atrial fibrillation with RVR ([place]) 01/24/2013   Blood transfusion    last 10' 14- "GI bleed"   CHF (congestive heart failure) (HCC)    CKD (chronic kidney disease) stage 3, GFR 30-59 ml/min ([place]) 01/08/2016   Depression    Diabetes mellitus ([place]) 09/18/2011   Diverticulosis    DJD (degenerative joint disease)    DM type 2, uncontrolled, 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   Edema extremities    bilateral lower extremities-"weepy areas due to fluid retention"   Fatty liver    Fundic gland polyps of stomach, benign    Headache(784.0)    Hepatitis B    History of alcohol abuse    last drink in 1993   Hypertension    Neuromuscular disorder ([place])    Neuropathy, 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 06-04-14 "doesn't use much now, no mask/tubing now.    Current Outpatient Medications  Medication Sig Dispense Refill   amLODipine (NORVASC) 5 MG tablet Take 5 mg by mouth daily.     aspirin EC 81 MG tablet Take 1 tablet (81 mg total) by mouth daily. Swallow whole. 30 tablet 3   atorvastatin (LIPITOR) 80 MG tablet Take 1 tablet (80 mg total) by mouth daily. 30 tablet 3   carvedilol (COREG) 12.5 MG tablet Take 12.5 mg by mouth 2 (two) times daily.     cholecalciferol (VITAMIN D3) 25 MCG (1000 UT) tablet Take 1,000 Units by mouth daily.     dapagliflozin propanediol (FARXIGA) 10 MG TABS tablet Take 1 tablet (10 mg total) by mouth daily. 30 tablet 6   DULoxetine (CYMBALTA) 60 MG capsule Take 1 capsule (60 mg total) by mouth daily. 30 capsule 0   glucose blood (ACCU-CHEK AVIVA PLUS) test  strip 1 each by Other route See admin instructions. Check blood sugar twice daily     icosapent Ethyl (VASCEPA) 1 g capsule Take 2 capsules (2 g total) by mouth 2 (two) times daily. 120 capsule 3   insulin glargine (LANTUS) 100 UNIT/ML injection Inject 0.2 mLs (20 Units total) into the skin daily at 10 pm. (Patient taking differently: Inject 30 Units into the skin at bedtime.) 10 mL 11   melatonin 3 MG TABS tablet Take 3 mg by mouth at bedtime.     metFORMIN (GLUCOPHAGE) 1000 MG tablet Take 1,000 mg by mouth 2 (two) times daily.     Multiple Vitamins-Minerals (CENTRUM SILVER  50+MEN) TABS Take 1 tablet by mouth daily.     pantoprazole (PROTONIX) 40 MG tablet Take 1 tablet (40 mg total) by mouth daily before breakfast. 90 tablet 3   potassium chloride (KLOR-CON M) 10 MEQ tablet Take 1 tablet (10 mEq total) by mouth 2 (two) times daily. 60 tablet 2   pregabalin (LYRICA) 200 MG capsule Take 200 mg by mouth 3 (three) times daily.     sacubitril-valsartan (ENTRESTO) 24-26 MG Take 1 tablet by mouth 2 (two) times daily. 60 tablet 3   silver sulfADIAZINE (SILVADENE) 1 % cream Apply 1 Application topically 2 (two) times daily. Mix at home 1:1 with triamcinolone and apply 2 times a day to upper back, chest, under breast, groin areas and legs.     spironolactone (ALDACTONE) 25 MG tablet Take 1 tablet (25 mg total) by mouth daily. 90 tablet 3   ticagrelor (BRILINTA) 90 MG TABS tablet Take 1 tablet (90 mg total) by mouth 2 (two) times daily. 60 tablet 11   torsemide (DEMADEX) 10 MG tablet Take 5 tablets (50 mg total) by mouth 2 (two) times daily. 60 tablet 3   traZODone (DESYREL) 300 MG tablet Take 1 tablet (300 mg total) by mouth at bedtime. 30 tablet 0   triamcinolone cream (KENALOG) 0.1 % Apply 1 Application topically 2 (two) times daily. Mix at home 1:1 with triamcinolone and apply 2 times a day to upper back, chest, under breast, groin areas and legs.     VICTOZA 18 MG/3ML SOPN Inject 1.8 mg into the skin  daily.     No current facility-administered medications for this encounter.    Allergies  Allergen Reactions   Chlorzoxazone Other (See Comments)    Other reaction(s): Angioedema (ALLERGY/intolerance)   Piroxicam Shortness Of Breath and Swelling    Tongue   Latex Other (See Comments) and Rash    Rash and itching Other reaction(s): Other (See Comments) Burning Feeling   Adhesive [Tape] Other (See Comments)    welts Unknown paper tape ok to use       Social History   Socioeconomic History   Marital status: Married    Spouse name: Jocelyn Lamer   Number of children: 6   Years of education: Not on file   Highest education level: Master's degree (e.g., MA, MS, MEng, MEd, MSW, MBA)  Occupational History   Occupation: DISABLED  Tobacco Use   Smoking status: Never   Smokeless tobacco: Never  Vaping Use   Vaping Use: Never used  Substance and Sexual Activity   Alcohol use: No    Alcohol/week: 0.0 standard drinks of alcohol   Drug use: No   Sexual activity: Not Currently  Other Topics Concern   Not on file  Social History Narrative   Patient is divorced, he has a total of 6 children 2 of which are deceased. He is a retired Conservation officer, historic buildings. History of alcoholism. No alcohol now. 2 caffeinated beverages daily.    Married for 3rd time   Social Determinants of Health   Financial Resource Strain: Low Risk  (01/23/2022)   Overall Financial Resource Strain (CARDIA)    Difficulty of Paying Living Expenses: Not hard at all  Food Insecurity: No Food Insecurity (01/22/2022)   Hunger Vital Sign    Worried About Running Out of Food in the Last Year: Never true    Ran Out of Food in the Last Year: Never true  Transportation Needs: No Transportation Needs (01/22/2022)   PRAPARE - Transportation  Lack of Transportation (Medical): No    Lack of Transportation (Non-Medical): No  Physical Activity: Not on file  Stress: Not on file  Social Connections: Not on file  Intimate Partner  Violence: Not At Risk (01/22/2022)   Humiliation, Afraid, Rape, and Kick questionnaire    Fear of Current or Ex-Partner: No    Emotionally Abused: No    Physically Abused: No    Sexually Abused: No      Family History  Problem Relation Age of Onset   Heart disease Father    Skin cancer Father    COPD Father    Hypertension Father    Stroke Father    Arthritis Mother    Osteoporosis Mother    Diabetes Daughter    Depression Son    Heart disease Maternal Uncle    High blood pressure Sister    High blood pressure Sister    High blood pressure Son    Depression Son    Arthritis Sister    Arthritis Sister    Cancer Maternal Grandmother    Heart disease Maternal Uncle    Heart disease Paternal Uncle     Vitals:   02/02/22 1210  BP: 110/80  Pulse: 74  SpO2: 95%  Weight: (!) 176.6 kg (389 lb 6.4 oz)    PHYSICAL EXAM: General:  Well appearing, obese. No respiratory difficulty HEENT: normal Neck: supple. no JVD. Carotids 2+ bilat; no bruits. No lymphadenopathy or thryomegaly appreciated. Cor: PMI nondisplaced. Regular rate & rhythm. No rubs, gallops or murmurs. Lungs: clear Abdomen: obese, soft, nontender, nondistended. No hepatosplenomegaly. No bruits or masses. Good bowel sounds. Large panus  Extremities: no cyanosis, clubbing, rash, edema Neuro: alert & oriented x 3, cranial nerves grossly intact. moves all 4 extremities w/o difficulty. Affect pleasant.  ECG: NSR 1st deg AVB 76 bpm    ASSESSMENT & PLAN:  1. HFpEF - Echo 11/23 EF low normal, ~50% - NYHA Class II, confounded by obesity/ deconditioning  - Volume appears ok on exam. Check BNP  - Continue Farxgia 10 mg  - Entresto 24-26 mg bid  - Spironolactone 25 mg daily  - Coreg 12.5 mg bid  - Torsemide 50 mg bid. Given wt significant wt loss, If SCr elevated, will reduce to once daily   - check BMP and BNP today    2. CAD - s/p recent NSTEMI 11/23 Mild non-obstructive disease in the mid and distal  LAD Large Circumflex with occluded large first obtuse marginal branch.  Successful PTCA/DES x 1 obtuse marginal branch Large dominant RCA with mild mid vessel stenosis - Stable w/o angina - DAPT w/ ASA + Brilinta - Statin. Atorva 80 mg   3. DLD - TG 335, LDL 26 - continue atorva 80 mg - continue Vascepa 2g bid - cardiology to follow lipids  4. Type 2DM  - poorly controlled, Hgb A1c 8.1 - continue insulin + Farxiga  - on statin  - management per PCP   5. HTN - controlled on current regimen - GDMT per above    NYHA II GDMT  Diuretic- torsemide 50 mg bid  BB- Coreg 12.5 mg bid  Ace/ARB/ARNI Entresto 24-26 mg bid  MRA Spironolactone 25 mg daily  SGLT2i Farxiga 10 mg daily   Keep F/u w/ Dr. Kathalene Frames team.  Scheduled 11/29.   Referred to HFSW (PCP, Medications, Transportation, ETOH Abuse, Drug Abuse, Insurance, Financial ): No  Refer to Pharmacy: No Refer to Home Health: No Refer to Advanced Heart Failure Clinic:  No  Refer to General Cardiology: No   Follow up: keep f/u w/ gen cards 11/29

## 2022-02-02 NOTE — Telephone Encounter (Signed)
Call attempted to confirm [facility] TOC appt 12 noon on 02/02/22. HIPPA appropriate VM left with callback number.   Earnestine Leys, BSN, Clinical cytogeneticist Only

## 2022-02-09 NOTE — Progress Notes (Signed)
CKD 2 - GFR >60 (correction for Consult note)

## 2022-02-10 NOTE — Progress Notes (Unsigned)
Cardiology Office Note:    Date:  02/11/2022   ID:  Phillip Maldonado, DOB 1950/04/19, MRN [id number]  PCP:  Glennie Hawk, [place] Cardiologist: Evalina Field, MD   Reason for visit: Hospital follow-up  History of Present Illness:    Phillip Maldonado is a 71 y.o. male with a hx of diastolic heart failure, diabetes, hypertension, hyperlipidemia and obesity who was hospitalized earlier this month for acute substernal chest pain radiating to the jaw.  He also reported few weeks of worsening LE edema, orthopnea and dyspnea on exertion. + NSTEMI.  LHC with Large Circumflex with occluded large first OM, successful PTCA/DES x 1 OM.  Echo with LVEF low normal.  Given IV Lasix and transitioned to torsemide 50 mg twice daily (from torsemide '100mg'$  daily).  Losartan discontinued and started on Farxiga, Aldactone and Entresto.  He saw HF clinic 11/202/23.  Patient noted mild DOE.  Pt had lost weight.  BNP normal.  Cr slightly elevated at 1.4 from 1.2.    Today, patient comes in with his wife.  He is doing well without further chest pain.  He mentions mild shortness of breath with significant body position changes such as turning positions in bed or when he first lays down.  He is able to ambulate in the house.  He states he needs a new walker.  He used a scooter to get to the office today.  He mentions he has bad knees.  From a heart failure standpoint, he has had great improvement in LE edema.  He is now able to get compression stockings on.  He denies PND.  He does not lay flat due to comfort.  Has mild lightheadedness after AM medications.  He denies bleeding issues with aspirin and Brilinta.  He is interested in cardiac rehab.    Past Medical History:  Diagnosis Date   Anemia    Angina    Anxiety    Arthritis    Atrial fibrillation with RVR ([place]) 01/24/2013   Blood transfusion    last 10' 14- "GI bleed"   CHF (congestive heart failure) (HCC)    CKD (chronic kidney  disease) stage 3, GFR 30-59 ml/min ([place]) 01/08/2016   Depression    Diabetes mellitus ([place]) 09/18/2011   Diverticulosis    DJD (degenerative joint disease)    DM type 2, uncontrolled, 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   Edema extremities    bilateral lower extremities-"weepy areas due to fluid retention"   Fatty liver    Fundic gland polyps of stomach, benign    Headache(784.0)    Hepatitis B    History of alcohol abuse    last drink in 1993   Hypertension    Neuromuscular disorder ([place])    Neuropathy, 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 06-04-14 "doesn't use much now, no mask/tubing now.    Past Surgical History:  Procedure Laterality Date   ABDOMINAL SURGERY     [phone]   BACK SURGERY     x 8 -,multiple fusions(cervical to lumbar)   BILIARY STENT PLACEMENT  02/07/2019   Procedure: BILIARY STENT PLACEMENT;  Surgeon: Ladene Artist, MD;  Location: [facility] ENDOSCOPY;  Service: Endoscopy;;  CHOLECYSTECTOMY N/A 02/02/2019   Procedure: attempted LAPAROSCOPIC CHOLECYSTECTOMY;  Surgeon: Coralie Keens, MD;  Location: [place];  Service: General;  Laterality: N/A;   CHOLECYSTECTOMY  02/02/2019   Procedure: Cholecystectomy;  Surgeon: Coralie Keens, MD;  Location: [place];  Service: General;;   COLONOSCOPY     COLONOSCOPY WITH PROPOFOL N/A 06/14/2014   Procedure: COLONOSCOPY WITH PROPOFOL;  Surgeon: Gatha Mayer, MD;  Location: [facility] ENDOSCOPY;  Service: Endoscopy;  Laterality: N/A;   CORONARY STENT INTERVENTION N/A 01/22/2022   Procedure: CORONARY STENT INTERVENTION;  Surgeon: Burnell Blanks, MD;  Location: [place] CV LAB;  Service: Cardiovascular;  Laterality: N/A;   DIAGNOSTIC LAPAROSCOPY     ERCP N/A 02/07/2019   Procedure:  ENDOSCOPIC RETROGRADE CHOLANGIOPANCREATOGRAPHY (ERCP);  Surgeon: Ladene Artist, MD;  Location: [facility] ENDOSCOPY;  Service: Endoscopy;  Laterality: N/A;   ERCP N/A 04/07/2019   Procedure: ENDOSCOPIC RETROGRADE CHOLANGIOPANCREATOGRAPHY (ERCP);  Surgeon: Gatha Mayer, MD;  Location: [facility] ENDOSCOPY;  Service: Endoscopy;  Laterality: N/A;   ESOPHAGOGASTRODUODENOSCOPY N/A 01/03/2013   Procedure: ESOPHAGOGASTRODUODENOSCOPY (EGD);  Surgeon: Arta Silence, MD;  Location: [facility] ORS;  Service: Endoscopy;  Laterality: N/A;   ESOPHAGOGASTRODUODENOSCOPY N/A 01/03/2013   Procedure: ESOPHAGOGASTRODUODENOSCOPY (EGD);  Surgeon: Arta Silence, MD;  Location: [facility] ENDOSCOPY;  Service: Endoscopy;  Laterality: N/A;   ESOPHAGOGASTRODUODENOSCOPY N/A 01/23/2013   Procedure: ESOPHAGOGASTRODUODENOSCOPY (EGD);  Surgeon: Lear Ng, MD;  Location: [facility] ENDOSCOPY;  Service: Endoscopy;  Laterality: N/A;   ESOPHAGOGASTRODUODENOSCOPY Left 01/27/2013   Procedure: ESOPHAGOGASTRODUODENOSCOPY (EGD);  Surgeon: Lear Ng, MD;  Location: [place];  Service: Endoscopy;  Laterality: Left;   ESOPHAGOGASTRODUODENOSCOPY N/A 06/14/2014   Procedure: ESOPHAGOGASTRODUODENOSCOPY (EGD);  Surgeon: Gatha Mayer, MD;  Location: [facility] ENDOSCOPY;  Service: Endoscopy;  Laterality: N/A;   ESOPHAGOSCOPY N/A 01/01/2013   Procedure: ESOPHAGOSCOPY;  Surgeon: Jeryl [place], MD;  Location: [place];  Service: Endoscopy;  Laterality: N/A;   GASTRIC BYPASS  1977   Reversed in 1992 and revision [place] N/A 04/07/2019   Procedure: GASTROINTESTINAL STENT REMOVAL;  Surgeon: Gatha Mayer, MD;  Location: [facility] ENDOSCOPY;  Service: Endoscopy;  Laterality: N/A;   HYDROCELE EXCISION  1996   x 2    KNEE SURGERY Left 2004   LEFT HEART CATH AND CORONARY ANGIOGRAPHY N/A 01/22/2022   Procedure: LEFT HEART CATH AND CORONARY ANGIOGRAPHY;  Surgeon: Burnell Blanks, MD;  Location: [place] CV LAB;  Service: Cardiovascular;   Laterality: N/A;    Current Medications: Current Meds  Medication Sig   amLODipine (NORVASC) 5 MG tablet Take 5 mg by mouth daily.   aspirin EC 81 MG tablet Take 1 tablet (81 mg total) by mouth daily. Swallow whole.   atorvastatin (LIPITOR) 80 MG tablet Take 1 tablet (80 mg total) by mouth daily.   carvedilol (COREG) 12.5 MG tablet Take 12.5 mg by mouth 2 (two) times daily.   cholecalciferol (VITAMIN D3) 25 MCG (1000 UT) tablet Take 1,000 Units by mouth daily.   dapagliflozin propanediol (FARXIGA) 10 MG TABS tablet Take 1 tablet (10 mg total) by mouth daily.   DULoxetine (CYMBALTA) 60 MG capsule Take 1 capsule (60 mg total) by mouth daily.   glucose blood (ACCU-CHEK AVIVA PLUS) test strip 1 each by Other route See admin instructions. Check blood sugar twice daily   icosapent Ethyl (VASCEPA) 1 g capsule Take 2 capsules (2 g total) by mouth 2 (two) times daily.   insulin glargine (LANTUS) 100 UNIT/ML injection Inject 0.2 mLs (  20 Units total) into the skin daily at 10 pm. (Patient taking differently: Inject 30 Units into the skin at bedtime.)   melatonin 3 MG TABS tablet Take 3 mg by mouth at bedtime.   metFORMIN (GLUCOPHAGE) 1000 MG tablet Take 1,000 mg by mouth 2 (two) times daily.   Multiple Vitamins-Minerals (CENTRUM SILVER 50+MEN) TABS Take 1 tablet by mouth daily.   nitroGLYCERIN (NITROSTAT) 0.4 MG SL tablet Place 1 tablet (0.4 mg total) under the tongue every 5 (five) minutes as needed for chest pain.   pantoprazole (PROTONIX) 40 MG tablet Take 1 tablet (40 mg total) by mouth daily before breakfast.   potassium chloride (KLOR-CON M) 10 MEQ tablet Take 1 tablet (10 mEq total) by mouth 2 (two) times daily.   pregabalin (LYRICA) 200 MG capsule Take 200 mg by mouth 3 (three) times daily.   sacubitril-valsartan (ENTRESTO) 24-26 MG Take 1 tablet by mouth 2 (two) times daily.   silver sulfADIAZINE (SILVADENE) 1 % cream Apply 1 Application topically 2 (two) times daily. Mix at home 1:1 with  triamcinolone and apply 2 times a day to upper back, chest, under breast, groin areas and legs.   spironolactone (ALDACTONE) 25 MG tablet Take 1 tablet (25 mg total) by mouth daily.   ticagrelor (BRILINTA) 90 MG TABS tablet Take 1 tablet (90 mg total) by mouth 2 (two) times daily.   torsemide (DEMADEX) 10 MG tablet Take 5 tablets (50 mg total) by mouth 2 (two) times daily.   traZODone (DESYREL) 300 MG tablet Take 1 tablet (300 mg total) by mouth at bedtime.   triamcinolone cream (KENALOG) 0.1 % Apply 1 Application topically 2 (two) times daily. Mix at home 1:1 with triamcinolone and apply 2 times a day to upper back, chest, under breast, groin areas and legs.   VICTOZA 18 MG/3ML SOPN Inject 1.8 mg into the skin daily.     Allergies:   Chlorzoxazone, Piroxicam, Latex, and Adhesive [tape]   Social History   Socioeconomic History   Marital status: Married    Spouse name: Jocelyn Lamer   Number of children: 6   Years of education: Not on file   Highest education level: Master's degree (e.g., MA, MS, MEng, MEd, MSW, MBA)  Occupational History   Occupation: DISABLED  Tobacco Use   Smoking status: Never   Smokeless tobacco: Never  Vaping Use   Vaping Use: Never used  Substance and Sexual Activity   Alcohol use: No    Alcohol/week: 0.0 standard drinks of alcohol   Drug use: No   Sexual activity: Not Currently  Other Topics Concern   Not on file  Social History Narrative   Patient is divorced, he has a total of 6 children 2 of which are deceased. He is a retired Conservation officer, historic buildings. History of alcoholism. No alcohol now. 2 caffeinated beverages daily.    Married for 3rd time   Social Determinants of Health   Financial Resource Strain: Low Risk  (01/23/2022)   Overall Financial Resource Strain (CARDIA)    Difficulty of Paying Living Expenses: Not hard at all  Food Insecurity: No Food Insecurity (01/22/2022)   Hunger Vital Sign    Worried About Running Out of Food in the Last Year: Never true     Ran Out of Food in the Last Year: Never true  Transportation Needs: No Transportation Needs (01/22/2022)   PRAPARE - Hydrologist (Medical): No    Lack of Transportation (Non-Medical): No  Physical Activity:  Not on file  Stress: Not on file  Social Connections: Not on file     Family History: The patient's family history includes Arthritis in his mother, sister, and sister; COPD in his father; Cancer in his maternal grandmother; Depression in his son and son; Diabetes in his daughter; Heart disease in his father, maternal uncle, maternal uncle, and paternal uncle; High blood pressure in his sister, sister, and son; Hypertension in his father; Osteoporosis in his mother; Skin cancer in his father; Stroke in his father.  ROS:   Please see the history of present illness.     EKGs/Labs/Other Studies Reviewed:    Recent Labs: 01/23/2022: Hemoglobin 14.1; Magnesium 1.6; Platelets 186; TSH 1.433 02/02/2022: B Natriuretic Peptide 99.4; BUN 22; Creatinine, Ser 1.40; Potassium 4.0; Sodium 140   Recent Lipid Panel Lab Results  Component Value Date/Time   CHOL 130 01/22/2022 12:30 AM   CHOL 223 (H) 02/12/2015 02:24 PM   TRIG 335 (H) 01/22/2022 12:30 AM   HDL 37 (L) 01/22/2022 12:30 AM   HDL 38 (L) 02/12/2015 02:24 PM   LDLCALC 26 01/22/2022 12:30 AM   LDLCALC 116 (H) 02/12/2015 02:24 PM    Physical Exam:    VS:  BP 134/80 (BP Location: Right Arm, Patient Position: Sitting, Cuff Size: Large)   Pulse 82   Ht '5\' 11"'$  (1.803 m)   Wt (!) 390 lb (176.9 kg)   BMI 54.39 kg/m    No data found.       Wt Readings from Last 3 Encounters:  02/11/22 (!) 390 lb (176.9 kg)  02/02/22 (!) 389 lb 6.4 oz (176.6 kg)  01/24/22 (!) 401 lb 0.3 oz (181.9 kg)     GEN:  Well nourished, well developed in no acute distress; obese HEENT: Normal NECK: No JVD; No carotid bruits CARDIAC: RRR, no murmurs, rubs, gallops RESPIRATORY:  Clear to auscultation without rales,  wheezing or rhonchi  ABDOMEN: Soft, non-tender, non-distended MUSCULOSKELETAL: No edema; wearing compression stockings SKIN: Warm and dry NEUROLOGIC:  Alert and oriented PSYCHIATRIC:  Normal affect     ASSESSMENT AND PLAN   Coronary artery disease with no angina NSTEMI 01/2022 -LHC with Large Circumflex with occluded large first OM, successful PTCA/DES x 1 OM  -Echo with EF low normal -Continue aspirin and Brilinta x 1 year -Continue beta-blocker, Entresto, Farxiga, Lipitor 80 mg daily -Lower A1c (8.1%) -PCP manages -[facility] -to help with cardiac risk, diabetes, shortness of breath and osteoarthritis. -Encourage cardiac rehab.  Chronic diastolic heart failure, euvolemic -Echo oh 01/2022 with low normal EF, ~50% -Continue torsemide '50mg'$  twice daily. -With addition of Farxiga and Aldactone, change from losartan to [place], --recommend checking BMET in 2 weeks. -Do think with weight loss, we can down titrate his medications.  Hypertension, well-controlled -Continue current meds. -Hopefully with cardiac rehab and weight loss, we can cut back on some of his medications (example amlodipine).   -Goal BP is <130/80.  Recommend DASH diet (high in vegetables, fruits, low-fat dairy products, whole grains, poultry, fish, and nuts and low in sweets, sugar-sweetened beverages, and red meats), salt restriction and increase physical activity.  Hyperlipidemia with goal LDL <70 -Vascepa added 01/2022 for high trigs 335 -Continue Lipitor 80 mg daily. -Discussed cholesterol lowering diets - Mediterranean diet, DASH diet, vegetarian diet, low-carbohydrate diet and avoidance of trans fats.  Discussed healthier choice substitutes.  Nuts, high-fiber foods, and fiber supplements may also improve lipids.    Obesity -Discussed how  even a 5-10% weight loss can have cardiovascular benefits.   -Recommend cardiac rehab. -[facility].  Disposition - Follow-up in 3 months to Dr. Audie Box.   Medication Adjustments/Labs and Tests Ordered: Current medicines are reviewed at length with the patient today.  Concerns regarding medicines are outlined above.  Orders Placed This Encounter  Procedures   Basic metabolic panel   Amb Ref to Medical Weight Management   No orders of the defined types were placed in this encounter.   Patient Instructions  Medication Instructions:  No Changes *If you need a refill on your cardiac medications before your next appointment, please call your pharmacy*   Lab Work: BMET 2 weeks If you have labs (blood work) drawn today and your tests are completely normal, you will receive your results only by: [place] (if you have MyChart) OR A paper copy in the mail If you have any lab test that is abnormal or we need to change your treatment, we will call you to review the results.   Testing/Procedures: No Testing   Follow-Up: At [facility], you and your health needs are our priority.  As part of our continuing mission to provide you with exceptional heart care, we have created designated Provider Care Teams.  These Care Teams include your primary Cardiologist (physician) and Advanced Practice Providers (APPs -  Physician Assistants and Nurse Practitioners) who all work together to provide you with the care you need, when you need it.  We recommend signing up for the patient portal called "MyChart".  Sign up information is provided on this After Visit Summary.  MyChart is used to connect with patients for Virtual Visits (Telemedicine).  Patients are able to view lab/test results, encounter notes, upcoming appointments, etc.  Non-urgent messages can be sent to your provider as well.   To learn more about what you can do with MyChart, go to [email or web address].    Your next appointment:   3 month(s)  The format for your next appointment:   In  Person  Provider:  Evalina Field, MD       Signed, Warren Lacy, PA-C  02/11/2022 12:56 PM    [place]

## 2022-02-11 ENCOUNTER — Encounter (INDEPENDENT_AMBULATORY_CARE_PROVIDER_SITE_OTHER): Payer: Self-pay

## 2022-02-11 ENCOUNTER — Ambulatory Visit: Payer: Medicare Other | Attending: Physician Assistant | Admitting: Physician Assistant

## 2022-02-11 VITALS — BP 134/80 | HR 82 | Ht 71.0 in | Wt 390.0 lb

## 2022-02-11 DIAGNOSIS — I5032 Chronic diastolic (congestive) heart failure: Secondary | ICD-10-CM | POA: Diagnosis not present

## 2022-02-11 DIAGNOSIS — I2510 Atherosclerotic heart disease of native coronary artery without angina pectoris: Secondary | ICD-10-CM

## 2022-02-11 DIAGNOSIS — I10 Essential (primary) hypertension: Secondary | ICD-10-CM

## 2022-02-11 DIAGNOSIS — E785 Hyperlipidemia, unspecified: Secondary | ICD-10-CM

## 2022-02-11 DIAGNOSIS — Z9861 Coronary angioplasty status: Secondary | ICD-10-CM

## 2022-02-11 NOTE — Patient Instructions (Signed)
Medication Instructions:  No Changes *If you need a refill on your cardiac medications before your next appointment, please call your pharmacy*   Lab Work: BMET 2 weeks If you have labs (blood work) drawn today and your tests are completely normal, you will receive your results only by: [place] (if you have MyChart) OR A paper copy in the mail If you have any lab test that is abnormal or we need to change your treatment, we will call you to review the results.   Testing/Procedures: No Testing   Follow-Up: At [facility], you and your health needs are our priority.  As part of our continuing mission to provide you with exceptional heart care, we have created designated Provider Care Teams.  These Care Teams include your primary Cardiologist (physician) and Advanced Practice Providers (APPs -  Physician Assistants and Nurse Practitioners) who all work together to provide you with the care you need, when you need it.  We recommend signing up for the patient portal called "MyChart".  Sign up information is provided on this After Visit Summary.  MyChart is used to connect with patients for Virtual Visits (Telemedicine).  Patients are able to view lab/test results, encounter notes, upcoming appointments, etc.  Non-urgent messages can be sent to your provider as well.   To learn more about what you can do with MyChart, go to [email or web address].    Your next appointment:   3 month(s)  The format for your next appointment:   In Person  Provider:  Evalina Field, MD

## 2022-02-21 ENCOUNTER — Other Ambulatory Visit: Payer: Self-pay | Admitting: Cardiology

## 2022-02-28 ENCOUNTER — Other Ambulatory Visit: Payer: Self-pay | Admitting: Home Health

## 2022-03-19 ENCOUNTER — Telehealth (HOSPITAL_COMMUNITY): Payer: Self-pay

## 2022-03-19 NOTE — Telephone Encounter (Signed)
Pt insurance is active and benefits verified through [facility] Medicare Co-pay 0, DED 0/0 met, out of pocket $3,600/0 met, co-insurance 0%. no pre-authorization required. Passport, 03/19/2022_0 :46pm, REF# [id number]   How many CR sessions are covered? (72 sessions for ICR)72 Is this a lifetime maximum or an annual maximum? annual Has the member used any of these services to date? no Is there a time limit (weeks/months) on start of program and/or program completion? no

## 2022-03-19 NOTE — Telephone Encounter (Signed)
Called patient to see if he was interested in participating in the Cardiac Rehab Program. Patient stated yes. Patient will come in for orientation on 03/25/2419:15PM and will attend the 1:45pm exercise class.

## 2022-03-20 ENCOUNTER — Telehealth (HOSPITAL_COMMUNITY): Payer: Self-pay

## 2022-03-23 ENCOUNTER — Encounter (HOSPITAL_COMMUNITY): Payer: Self-pay | Admitting: *Deleted

## 2022-03-23 ENCOUNTER — Telehealth (HOSPITAL_COMMUNITY): Payer: Self-pay | Admitting: *Deleted

## 2022-03-23 NOTE — Telephone Encounter (Signed)
Spoke with the patient. Completed health history. Confirmed appointment. Asked patient to check CBG before coming to orientation.Phillip Maldonado Gave RN BSN

## 2022-03-24 ENCOUNTER — Inpatient Hospital Stay (HOSPITAL_COMMUNITY): Admission: RE | Admit: 2022-03-24 | Payer: Medicare Other | Source: Ambulatory Visit

## 2022-03-30 ENCOUNTER — Ambulatory Visit (HOSPITAL_COMMUNITY): Payer: Medicare Other

## 2022-04-01 ENCOUNTER — Ambulatory Visit (HOSPITAL_COMMUNITY): Payer: Medicare Other

## 2022-04-03 ENCOUNTER — Ambulatory Visit (HOSPITAL_COMMUNITY): Payer: Medicare Other

## 2022-04-06 ENCOUNTER — Ambulatory Visit (HOSPITAL_COMMUNITY): Payer: Medicare Other

## 2022-04-08 ENCOUNTER — Ambulatory Visit (HOSPITAL_COMMUNITY): Payer: Medicare Other

## 2022-04-10 ENCOUNTER — Ambulatory Visit (HOSPITAL_COMMUNITY): Payer: Medicare Other

## 2022-04-13 ENCOUNTER — Ambulatory Visit (HOSPITAL_COMMUNITY): Payer: Medicare Other

## 2022-04-15 ENCOUNTER — Ambulatory Visit (HOSPITAL_COMMUNITY): Payer: Medicare Other

## 2022-04-17 ENCOUNTER — Ambulatory Visit (HOSPITAL_COMMUNITY): Payer: Medicare Other

## 2022-04-20 ENCOUNTER — Ambulatory Visit (HOSPITAL_COMMUNITY): Payer: Medicare Other

## 2022-04-22 ENCOUNTER — Ambulatory Visit (HOSPITAL_COMMUNITY): Payer: Medicare Other

## 2022-04-23 ENCOUNTER — Other Ambulatory Visit: Payer: Self-pay | Admitting: Home Health

## 2022-04-24 ENCOUNTER — Ambulatory Visit (HOSPITAL_COMMUNITY): Payer: Medicare Other

## 2022-04-27 ENCOUNTER — Ambulatory Visit (HOSPITAL_COMMUNITY): Payer: Medicare Other

## 2022-04-28 ENCOUNTER — Other Ambulatory Visit: Payer: Self-pay | Admitting: Cardiovascular Disease

## 2022-04-29 ENCOUNTER — Ambulatory Visit (HOSPITAL_COMMUNITY): Payer: Medicare Other

## 2022-04-29 ENCOUNTER — Other Ambulatory Visit: Payer: Self-pay

## 2022-04-29 MED ORDER — SILDENAFIL CITRATE 50 MG PO TABS: ORAL_TABLET | ORAL | 1 refills | Status: DC

## 2022-04-29 NOTE — Progress Notes (Signed)
Nitroglycerin discontinued per provider due to patient stating he no longer takes. Viagra refill sent to desired pharmacy.

## 2022-05-01 ENCOUNTER — Ambulatory Visit (HOSPITAL_COMMUNITY): Payer: Medicare Other

## 2022-05-04 ENCOUNTER — Ambulatory Visit (HOSPITAL_COMMUNITY): Payer: Medicare Other

## 2022-05-04 ENCOUNTER — Encounter (HOSPITAL_COMMUNITY): Payer: Self-pay

## 2022-05-04 ENCOUNTER — Telehealth (HOSPITAL_COMMUNITY): Payer: Self-pay

## 2022-05-04 NOTE — Telephone Encounter (Signed)
Attempted to call patient in regards to Cardiac Rehab - LM on VM  Mailed letter 

## 2022-05-06 ENCOUNTER — Ambulatory Visit (HOSPITAL_COMMUNITY): Payer: Medicare Other

## 2022-05-08 ENCOUNTER — Ambulatory Visit (HOSPITAL_COMMUNITY): Payer: Medicare Other

## 2022-05-11 ENCOUNTER — Ambulatory Visit (HOSPITAL_COMMUNITY): Payer: Medicare Other

## 2022-05-13 ENCOUNTER — Ambulatory Visit (HOSPITAL_COMMUNITY): Payer: Medicare Other

## 2022-05-14 ENCOUNTER — Encounter (INDEPENDENT_AMBULATORY_CARE_PROVIDER_SITE_OTHER): Payer: Self-pay | Admitting: Internal Medicine

## 2022-05-14 ENCOUNTER — Ambulatory Visit (INDEPENDENT_AMBULATORY_CARE_PROVIDER_SITE_OTHER): Payer: Medicare Other | Admitting: Internal Medicine

## 2022-05-14 VITALS — BP 112/61 | HR 70 | Temp 97.5°F | Ht 66.0 in | Wt 384.0 lb

## 2022-05-14 DIAGNOSIS — E1169 Type 2 diabetes mellitus with other specified complication: Secondary | ICD-10-CM | POA: Diagnosis not present

## 2022-05-14 DIAGNOSIS — I10 Essential (primary) hypertension: Secondary | ICD-10-CM

## 2022-05-14 DIAGNOSIS — Z6841 Body Mass Index (BMI) 40.0 and over, adult: Secondary | ICD-10-CM

## 2022-05-14 DIAGNOSIS — K760 Fatty (change of) liver, not elsewhere classified: Secondary | ICD-10-CM | POA: Diagnosis not present

## 2022-05-14 DIAGNOSIS — G4733 Obstructive sleep apnea (adult) (pediatric): Secondary | ICD-10-CM | POA: Diagnosis not present

## 2022-05-14 DIAGNOSIS — Z7984 Long term (current) use of oral hypoglycemic drugs: Secondary | ICD-10-CM

## 2022-05-14 DIAGNOSIS — Z0289 Encounter for other administrative examinations: Secondary | ICD-10-CM

## 2022-05-14 NOTE — Assessment & Plan Note (Signed)
We reviewed weight, biometrics, associated medical conditions and contributing factors with patient. She would benefit from weight loss therapy via a modified calorie nutritional plan tailored to their REE (resting energy expenditure) which will be determined by indirect calorimetry.  We will also assess for cardiometabolic risk and nutritional derangements via fasting serologies at her next appointment.  We have to calculate protein intake considering he has stage III 3A chronic kidney disease.

## 2022-05-14 NOTE — Assessment & Plan Note (Signed)
On CPAP with reported good compliance. Continue PAP therapy. Losing 15% or more of body weight may improve AHI.

## 2022-05-14 NOTE — Assessment & Plan Note (Signed)
I reviewed past imaging he does have evidence of hepatic steatosis on ultrasound.  He also has sleep apnea and therefore is at risk for hepatic impairment in the future.  Losing 15% of body weight may improve condition.  We will also counseled him on nutrition at his intake appointment.

## 2022-05-14 NOTE — Assessment & Plan Note (Signed)
His most recent A1c was 8.1 which is close to goal for age and comorbid conditions.  He could not confirm if he was taking Victoza.  He is on SGLT-2, metformin and rapid acting insulin.  I feel like his medications need to be reconciled.  Lab Results  Component Value Date   HGBA1C 8.1 (H) 01/22/2022   HGBA1C 8.6 (H) 01/21/2019   HGBA1C 7.2 (H) 10/01/2016   Lab Results  Component Value Date   LDLCALC 26 01/22/2022   CREATININE 1.40 (H) 02/02/2022   Losing 10 to 15% of body weight may improve condition.  We also have to monitor closely for hypoglycemia when he starts changing macronutrient distribution.

## 2022-05-14 NOTE — Assessment & Plan Note (Signed)
Blood pressure at goal for age and risk category.  On amlodipine 10 mg, carvedilol 12.5 mg twice a day without adverse effects.  Most recent renal parameters stage 3A CKD.  Continue current regimen.  Losing 10% of body weight may improve blood pressure control.

## 2022-05-14 NOTE — Progress Notes (Signed)
Office: [phone]  /  Fax: [phone]   Initial Visit  Phillip Maldonado was seen in clinic today to evaluate for obesity. He is interested in losing weight to improve overall health and reduce the risk of weight related complications. He presents today to review program treatment options, initial physical assessment, and evaluation.     He was referred by: Specialist Cardiologist  When asked what else they would like to accomplish? He states: Adopt healthier eating patterns, Improve energy levels and physical activity, Improve existing medical conditions, Reduce risk for a surgery, and Improve quality of life  When asked how has your weight affected you? He states: Contributed to medical problems, Contributed to orthopedic problems or mobility issues, Having fatigue, and Having poor endurance  Some associated conditions: Hypertension, Arthritis:knees, Hyperlipidemia, OSA, Diabetes, Heart disease, Lung disease, and Kidney disease  Contributing factors: Family history, Disruption of circadian rhythm, Nutritional, Medications, Reduced physical activity, and Eating patterns  Weight promoting medications identified: Psychotropic medications  Current nutrition plan: None  Current level of physical activity: Limited due to chronic pain or orthopedic problems  Current or previous pharmacotherapy: GLP-1  Response to medication: Other: no weight change   Past medical history includes:   Past Medical History:  Diagnosis Date   Anemia    Angina    Anxiety    Arthritis    Atrial fibrillation with RVR ([place]) 01/24/2013   Blood transfusion    last 10' 14- "GI bleed"   CHF (congestive heart failure) (HCC)    CKD (chronic kidney disease) stage 3, GFR 30-59 ml/min ([place]) 01/08/2016   Depression    Diabetes mellitus ([place]) 09/18/2011   Diverticulosis    DJD (degenerative joint disease)    DM type 2, uncontrolled, 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   Edema extremities    bilateral lower  extremities-"weepy areas due to fluid retention"   Fatty liver    Fundic gland polyps of stomach, benign    Headache(784.0)    Hepatitis B    History of alcohol abuse    last drink in 1993   Hypertension    Neuromuscular disorder ([place])    Neuropathy, 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 06-04-14 "doesn't use much now, no mask/tubing now.     Objective:   BP 112/61   Pulse 70   Temp (!) 97.5 F (36.4 C)   Ht '5\' 6"'$  (1.676 m)   Wt (!) 384 lb (174.2 kg)   SpO2 94%   BMI 61.98 kg/m  He was weighed on the bioimpedance scale: Body mass index is 61.98 kg/m.  Peak Weight:412 ,  Weight trend over the last 12 months: Increasing  General:  Alert, oriented and cooperative. Patient is in no acute distress.  Respiratory: Normal respiratory effort, no problems with respiration noted  Extremities: Normal range of motion.    Mental Status: Normal mood and affect. Normal behavior. Normal judgment and thought content.   DIAGNOSTIC DATA REVIEWED:  BMET    Component Value Date/Time   NA 140 02/02/2022 1253   NA 139 02/12/2015 1424   K 4.0 02/02/2022 1253   CL 104 02/02/2022 1253   CO2 23 02/02/2022 1253   GLUCOSE 152 (H) 02/02/2022 1253  BUN 22 02/02/2022 1253   BUN 15 02/12/2015 1424   CREATININE 1.40 (H) 02/02/2022 1253   CREATININE 1.14 10/01/2016 1504   CALCIUM 9.7 02/02/2022 1253   GFRNONAA 54 (L) 02/02/2022 1253   GFRNONAA 67 10/01/2016 1504   GFRAA 53 (L) 02/10/2019 0257   GFRAA 78 10/01/2016 1504   Lab Results  Component Value Date   HGBA1C 8.1 (H) 01/22/2022   HGBA1C 8.5 (H) 01/02/2013   No results found for: "INSULIN" CBC    Component Value Date/Time   WBC 9.2 01/23/2022 1119   RBC 4.42 01/23/2022 1119   HGB 14.1  01/23/2022 1119   HGB 12.3 (L) 02/12/2015 1424   HCT 42.8 01/23/2022 1119   HCT 37.9 02/12/2015 1424   PLT 186 01/23/2022 1119   MCV 96.8 01/23/2022 1119   MCV 84 02/12/2015 1424   MCH 31.9 01/23/2022 1119   MCHC 32.9 01/23/2022 1119   RDW 14.4 01/23/2022 1119   RDW 15.9 (H) 02/12/2015 1424   Iron/TIBC/Ferritin/ %Sat    Component Value Date/Time   IRON 35 (L) 09/16/2013 0243   TIBC 397 09/16/2013 0243   FERRITIN 11 (L) 09/16/2013 0243   IRONPCTSAT 9 (L) 09/16/2013 0243   Lipid Panel     Component Value Date/Time   CHOL 130 01/22/2022 0030   CHOL 223 (H) 02/12/2015 1424   TRIG 335 (H) 01/22/2022 0030   HDL 37 (L) 01/22/2022 0030   HDL 38 (L) 02/12/2015 1424   CHOLHDL 3.5 01/22/2022 0030   VLDL 67 (H) 01/22/2022 0030   LDLCALC 26 01/22/2022 0030   LDLCALC 116 (H) 02/12/2015 1424   Hepatic Function Panel     Component Value Date/Time   PROT 6.7 02/08/2019 1051   PROT 6.7 10/11/2014 0833   ALBUMIN 2.3 (L) 02/08/2019 1051   ALBUMIN 3.8 10/11/2014 0833   AST 37 02/08/2019 1051   ALT 37 02/08/2019 1051   ALKPHOS 92 02/08/2019 1051   BILITOT 0.8 02/08/2019 1051   BILITOT 0.4 10/11/2014 0833   BILIDIR 0.1 02/28/2014 1117      Component Value Date/Time   TSH 1.433 01/23/2022 0154   TSH 2.66 06/10/2016 1535     Assessment and Plan:   Primary hypertension Assessment & Plan: Blood pressure at goal for age and risk category.  On amlodipine 10 mg, carvedilol 12.5 mg twice a day without adverse effects.  Most recent renal parameters stage 3A CKD.  Continue current regimen.  Losing 10% of body weight may improve blood pressure control.   Type 2 diabetes mellitus with other specified complication, without long-term current use of insulin (HCC) Assessment & Plan: His most recent A1c was 8.1 which is close to goal for age and comorbid conditions.  He could not confirm if he was taking Victoza.  He is on SGLT-2, metformin and rapid acting insulin.  I feel like his  medications need to be reconciled.  Lab Results  Component Value Date   HGBA1C 8.1 (H) 01/22/2022   HGBA1C 8.6 (H) 01/21/2019   HGBA1C 7.2 (H) 10/01/2016   Lab Results  Component Value Date   LDLCALC 26 01/22/2022   CREATININE 1.40 (H) 02/02/2022   Losing 10 to 15% of body weight may improve condition.  We also have to monitor closely for hypoglycemia when he starts changing macronutrient distribution.    OSA (obstructive sleep apnea) Assessment & Plan: On CPAP with reported good compliance. Continue PAP therapy. Losing 15% or more of body weight may improve AHI.  NAFLD (nonalcoholic fatty liver disease) Assessment & Plan: I reviewed past imaging he does have evidence of hepatic steatosis on ultrasound.  He also has sleep apnea and therefore is at risk for hepatic impairment in the future.  Losing 15% of body weight may improve condition.  We will also counseled him on nutrition at his intake appointment.   Class 3 severe obesity with serious comorbidity and body mass index (BMI) of 60.0 to 69.9 in adult, unspecified obesity type [facility]) Assessment & Plan: We reviewed weight, biometrics, associated medical conditions and contributing factors with patient. She would benefit from weight loss therapy via a modified calorie nutritional plan tailored to their REE (resting energy expenditure) which will be determined by indirect calorimetry.  We will also assess for cardiometabolic risk and nutritional derangements via fasting serologies at her next appointment.  We have to calculate protein intake considering he has stage III 3A chronic kidney disease.         Obesity Treatment / Action Plan:  Patient will work on garnering support from family and friends to begin weight loss journey. Will work on eliminating or reducing the presence of highly palatable, calorie dense foods in the home. Will complete provided nutritional and psychosocial assessment questionnaire before the next  appointment. Will be scheduled for indirect calorimetry to determine resting energy expenditure in a fasting state.  This will allow [place] to create a reduced calorie, high-protein meal plan to promote loss of fat mass while preserving muscle mass. Counseled on the health benefits of losing 5%-15% of total body weight. Was counseled on nutritional approaches to weight loss and benefits of complex carbs and high quality protein as part of nutritional weight management. Was counseled on pharmacotherapy and role as an adjunct in weight management.   Obesity Education Performed Today:  He was weighed on the bioimpedance scale and results were discussed and documented in the synopsis.  We discussed obesity as a disease and the importance of a more detailed evaluation of all the factors contributing to the disease.  We discussed the importance of long term lifestyle changes which include nutrition, exercise and behavioral modifications as well as the importance of customizing this to his specific health and social needs.  We discussed the benefits of reaching a healthier weight to alleviate the symptoms of existing conditions and reduce the risks of the biomechanical, metabolic and psychological effects of obesity.  Phillip Maldonado appears to be in the action stage of change and states they are ready to start intensive lifestyle modifications and behavioral modifications.  30 minutes was spent today on this visit including the above counseling, pre-visit chart review, and post-visit documentation.  Reviewed by clinician on day of visit: allergies, medications, problem list, medical history, surgical history, family history, social history, and previous encounter notes.    Bertram Denver

## 2022-05-15 ENCOUNTER — Ambulatory Visit (HOSPITAL_COMMUNITY): Payer: Medicare Other

## 2022-05-17 NOTE — Progress Notes (Deleted)
Cardiology Office Note:   Date:  05/17/2022  NAME:  Phillip Maldonado    MRN: [id number] DOB:  [birth year]   PCP:  Glennie Hawk, MD  Cardiologist:  Evalina Field, MD  Electrophysiologist:  None   Referring MD: Glennie Hawk, MD   No chief complaint on file. ***  History of Present Illness:   Phillip Maldonado is a 72 y.o. male with a hx of CAD, HFpEF, DM, HLD, HTN who presents for follow-up. NSTEMI 01/2022.   Problem List NSTEMI/CAD -01/2022 -PCI OM1 2. HFpEF 3. DM -A1c 8.4 4. HTN 5. Obesity -BMI 61  Past Medical History: Past Medical History:  Diagnosis Date   Anemia    Angina    Anxiety    Arthritis    Atrial fibrillation with RVR ([place]) 01/24/2013   Blood transfusion    last 10' 14- "GI bleed"   CHF (congestive heart failure) (HCC)    CKD (chronic kidney disease) stage 3, GFR 30-59 ml/min ([place]) 01/08/2016   Depression    Diabetes mellitus ([place]) 09/18/2011   Diverticulosis    DJD (degenerative joint disease)    DM type 2, uncontrolled, 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   Edema extremities    bilateral lower extremities-"weepy areas due to fluid retention"   Fatty liver    Fundic gland polyps of stomach, benign    Headache(784.0)    Hepatitis B    History of alcohol abuse    last drink in 1993   Hypertension    Neuromuscular disorder ([place])    Neuropathy, 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 06-04-14 "doesn't use much now, no mask/tubing now.    Past Surgical History: Past Surgical History:  Procedure Laterality Date   ABDOMINAL SURGERY     [phone]   BACK SURGERY     x 8 -,multiple fusions(cervical to lumbar)   BILIARY STENT PLACEMENT  02/07/2019   Procedure: BILIARY STENT PLACEMENT;  Surgeon:  Ladene Artist, MD;  Location: [facility] ENDOSCOPY;  Service: Endoscopy;;   CHOLECYSTECTOMY N/A 02/02/2019   Procedure: attempted LAPAROSCOPIC CHOLECYSTECTOMY;  Surgeon: Coralie Keens, MD;  Location: [place];  Service: General;  Laterality: N/A;   CHOLECYSTECTOMY  02/02/2019   Procedure: Cholecystectomy;  Surgeon: Coralie Keens, MD;  Location: [place];  Service: General;;   COLONOSCOPY     COLONOSCOPY WITH PROPOFOL N/A 06/14/2014   Procedure: COLONOSCOPY WITH PROPOFOL;  Surgeon: Gatha Mayer, MD;  Location: [facility] ENDOSCOPY;  Service: Endoscopy;  Laterality: N/A;   CORONARY STENT INTERVENTION N/A 01/22/2022   Procedure: CORONARY STENT INTERVENTION;  Surgeon: Burnell Blanks, MD;  Location: [place] CV LAB;  Service: Cardiovascular;  Laterality: N/A;   DIAGNOSTIC LAPAROSCOPY     ERCP N/A 02/07/2019   Procedure: ENDOSCOPIC RETROGRADE CHOLANGIOPANCREATOGRAPHY (ERCP);  Surgeon: Ladene Artist, MD;  Location: [facility] ENDOSCOPY;  Service: Endoscopy;  Laterality: N/A;   ERCP N/A 04/07/2019   Procedure: ENDOSCOPIC RETROGRADE CHOLANGIOPANCREATOGRAPHY (ERCP);  Surgeon: Gatha Mayer, MD;  Location: [facility] ENDOSCOPY;  Service: Endoscopy;  Laterality: N/A;   ESOPHAGOGASTRODUODENOSCOPY N/A 01/03/2013   Procedure: ESOPHAGOGASTRODUODENOSCOPY (EGD);  Surgeon: Arta Silence, MD;  Location: [facility] ORS;  Service: Endoscopy;  Laterality: N/A;   ESOPHAGOGASTRODUODENOSCOPY N/A 01/03/2013  Procedure: ESOPHAGOGASTRODUODENOSCOPY (EGD);  Surgeon: Arta Silence, MD;  Location: [facility] ENDOSCOPY;  Service: Endoscopy;  Laterality: N/A;   ESOPHAGOGASTRODUODENOSCOPY N/A 01/23/2013   Procedure: ESOPHAGOGASTRODUODENOSCOPY (EGD);  Surgeon: Lear Ng, MD;  Location: [facility] ENDOSCOPY;  Service: Endoscopy;  Laterality: N/A;   ESOPHAGOGASTRODUODENOSCOPY Left 01/27/2013   Procedure: ESOPHAGOGASTRODUODENOSCOPY (EGD);  Surgeon: Lear Ng, MD;  Location: [place];  Service: Endoscopy;  Laterality: Left;    ESOPHAGOGASTRODUODENOSCOPY N/A 06/14/2014   Procedure: ESOPHAGOGASTRODUODENOSCOPY (EGD);  Surgeon: Gatha Mayer, MD;  Location: [facility] ENDOSCOPY;  Service: Endoscopy;  Laterality: N/A;   ESOPHAGOSCOPY N/A 01/01/2013   Procedure: ESOPHAGOSCOPY;  Surgeon: Jeryl Columbia, MD;  Location: [place];  Service: Endoscopy;  Laterality: N/A;   GASTRIC BYPASS  1977   Reversed in 1992 and revision [place] N/A 04/07/2019   Procedure: GASTROINTESTINAL STENT REMOVAL;  Surgeon: Gatha Mayer, MD;  Location: [facility] ENDOSCOPY;  Service: Endoscopy;  Laterality: N/A;   HYDROCELE EXCISION  1996   x 2    KNEE SURGERY Left 2004   LEFT HEART CATH AND CORONARY ANGIOGRAPHY N/A 01/22/2022   Procedure: LEFT HEART CATH AND CORONARY ANGIOGRAPHY;  Surgeon: Burnell Blanks, MD;  Location: [place] CV LAB;  Service: Cardiovascular;  Laterality: N/A;    Current Medications: No outpatient medications have been marked as taking for the 05/18/22 encounter (Appointment) with O'Neal, Cassie Freer, MD.     Allergies:    Chlorzoxazone, Piroxicam, Latex, and Adhesive [tape]   Social History: Social History   Socioeconomic History   Marital status: Married    Spouse name: Jocelyn Lamer   Number of children: 6   Years of education: Not on file   Highest education level: Master's degree (e.g., MA, MS, MEng, MEd, MSW, MBA)  Occupational History   Occupation: DISABLED  Tobacco Use   Smoking status: Never   Smokeless tobacco: Never  Vaping Use   Vaping Use: Never used  Substance and Sexual Activity   Alcohol use: No    Alcohol/week: 0.0 standard drinks of alcohol   Drug use: No   Sexual activity: Not Currently  Other Topics Concern   Not on file  Social History Narrative   Patient is divorced, he has a total of 6 children 2 of which are deceased. He is a retired Conservation officer, historic buildings. History of alcoholism. No alcohol now. 2 caffeinated beverages daily.    Married for 3rd time   Social Determinants  of Health   Financial Resource Strain: Low Risk  (01/23/2022)   Overall Financial Resource Strain (CARDIA)    Difficulty of Paying Living Expenses: Not hard at all  Food Insecurity: No Food Insecurity (01/22/2022)   Hunger Vital Sign    Worried About Running Out of Food in the Last Year: Never true    Ran Out of Food in the Last Year: Never true  Transportation Needs: No Transportation Needs (01/22/2022)   PRAPARE - Hydrologist (Medical): No    Lack of Transportation (Non-Medical): No  Physical Activity: Not on file  Stress: Not on file  Social Connections: Not on file     Family History: The patient's ***family history includes Arthritis in his mother, sister, and sister; COPD in his father; Cancer in his maternal grandmother; Depression in his son and son; Diabetes in his daughter; Heart disease in his father, maternal uncle, maternal uncle, and paternal uncle; High blood pressure in his sister, sister, and son; Hypertension in his father; Osteoporosis  in his mother; Skin cancer in his father; Stroke in his father.  ROS:   All other ROS reviewed and negative. Pertinent positives noted in the HPI.     EKGs/Labs/Other Studies Reviewed:   The following studies were personally reviewed by me today:  EKG:  EKG is *** ordered today.  The ekg ordered today demonstrates ***, and was personally reviewed by me.   Recent Labs: 01/23/2022: Hemoglobin 14.1; Magnesium 1.6; Platelets 186; TSH 1.433 02/02/2022: B Natriuretic Peptide 99.4; BUN 22; Creatinine, Ser 1.40; Potassium 4.0; Sodium 140   Recent Lipid Panel    Component Value Date/Time   CHOL 130 01/22/2022 0030   CHOL 223 (H) 02/12/2015 1424   TRIG 335 (H) 01/22/2022 0030   HDL 37 (L) 01/22/2022 0030   HDL 38 (L) 02/12/2015 1424   CHOLHDL 3.5 01/22/2022 0030   VLDL 67 (H) 01/22/2022 0030   LDLCALC 26 01/22/2022 0030   LDLCALC 116 (H) 02/12/2015 1424    Physical Exam:   VS:  There were no vitals  taken for this visit.   Wt Readings from Last 3 Encounters:  05/14/22 (!) 384 lb (174.2 kg)  02/11/22 (!) 390 lb (176.9 kg)  02/02/22 (!) 389 lb 6.4 oz (176.6 kg)    General: Well nourished, well developed, in no acute distress Head: Atraumatic, normal size  Eyes: PEERLA, EOMI  Neck: Supple, no JVD Endocrine: No thryomegaly Cardiac: Normal S1, S2; RRR; no murmurs, rubs, or gallops Lungs: Clear to auscultation bilaterally, no wheezing, rhonchi or rales  Abd: Soft, nontender, no hepatomegaly  Ext: No edema, pulses 2+ Musculoskeletal: No deformities, BUE and BLE strength normal and equal Skin: Warm and dry, no rashes   Neuro: Alert and oriented to person, place, time, and situation, CNII-XII grossly intact, no focal deficits  Psych: Normal mood and affect   ASSESSMENT:   Phillip Maldonado is a 72 y.o. male who presents for the following: No diagnosis found.  PLAN:   There are no diagnoses linked to this encounter.  {Are you ordering a CV Procedure (e.g. stress test, cath, DCCV, TEE, etc)?   Press F2        :[id number]  Disposition: No follow-ups on file.  Medication Adjustments/Labs and Tests Ordered: Current medicines are reviewed at length with the patient today.  Concerns regarding medicines are outlined above.  No orders of the defined types were placed in this encounter.  No orders of the defined types were placed in this encounter.   There are no Patient Instructions on file for this visit.   Time Spent with Patient: I have spent a total of *** minutes with patient reviewing hospital notes, telemetry, EKGs, labs and examining the patient as well as establishing an assessment and plan that was discussed with the patient.  > 50% of time was spent in direct patient care.  Signed, Addison Naegeli. Audie Box, MD, [place]  [address] [place], [place] [address] [phone]  05/17/2022 7:10 PM

## 2022-05-18 ENCOUNTER — Ambulatory Visit: Payer: Medicare Other | Admitting: Cardiovascular Disease

## 2022-05-18 DIAGNOSIS — I5032 Chronic diastolic (congestive) heart failure: Secondary | ICD-10-CM

## 2022-05-18 DIAGNOSIS — E785 Hyperlipidemia, unspecified: Secondary | ICD-10-CM

## 2022-05-18 DIAGNOSIS — I2510 Atherosclerotic heart disease of native coronary artery without angina pectoris: Secondary | ICD-10-CM

## 2022-05-18 DIAGNOSIS — I10 Essential (primary) hypertension: Secondary | ICD-10-CM

## 2022-05-20 ENCOUNTER — Ambulatory Visit (HOSPITAL_COMMUNITY): Payer: Medicare Other

## 2022-05-22 ENCOUNTER — Ambulatory Visit (HOSPITAL_COMMUNITY): Payer: Medicare Other

## 2022-06-01 ENCOUNTER — Other Ambulatory Visit: Payer: Self-pay | Admitting: Home Health

## 2022-06-02 ENCOUNTER — Telehealth: Payer: Self-pay | Admitting: Cardiovascular Disease

## 2022-06-02 ENCOUNTER — Other Ambulatory Visit: Payer: Self-pay | Admitting: Home Health

## 2022-06-02 NOTE — Telephone Encounter (Signed)
*  STAT* If patient is at the pharmacy, call can be transferred to refill team.   1. Which medications need to be refilled? (please list name of each medication and dose if known) icosapent Ethyl (VASCEPA) 1 g capsule    2. Which pharmacy/location (including street and city if local pharmacy) is medication to be sent to? [place], [place] - [address] AT [place]    3. Do they need a 30 day or 90 day supply? 90.   Pt completely out, appt scheduled for 06/22/22

## 2022-06-03 ENCOUNTER — Ambulatory Visit (INDEPENDENT_AMBULATORY_CARE_PROVIDER_SITE_OTHER): Payer: Medicare Other | Admitting: Internal Medicine

## 2022-06-10 NOTE — Progress Notes (Deleted)
Cardiology Office Note:   Date:  06/10/2022  NAME:  Phillip Maldonado    MRN: [id number] DOB:  [birth year]   PCP:  Glennie Hawk, MD  Cardiologist:  Evalina Field, MD  Electrophysiologist:  None   Referring MD: Glennie Hawk, MD   No chief complaint on file. ***  History of Present Illness:   Phillip Maldonado is a 72 y.o. male with below history who presents for follow-up.   Problem List CAD -NSTEMI 01/2022 -PCI OM1 -30% RCA, 30% D1 2. HLD 3. HFpEF 4. DM 5. HTN 6. Morbid obesity  Past Medical History: Past Medical History:  Diagnosis Date   Anemia    Angina    Anxiety    Arthritis    Atrial fibrillation with RVR ([place]) 01/24/2013   Blood transfusion    last 10' 14- "GI bleed"   CHF (congestive heart failure) (HCC)    CKD (chronic kidney disease) stage 3, GFR 30-59 ml/min ([place]) 01/08/2016   Depression    Diabetes mellitus ([place]) 09/18/2011   Diverticulosis    DJD (degenerative joint disease)    DM type 2, uncontrolled, 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   Edema extremities    bilateral lower extremities-"weepy areas due to fluid retention"   Fatty liver    Fundic gland polyps of stomach, benign    Headache(784.0)    Hepatitis B    History of alcohol abuse    last drink in 1993   Hypertension    Neuromuscular disorder ([place])    Neuropathy, 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 06-04-14 "doesn't use much now, no mask/tubing now.    Past Surgical History: Past Surgical History:  Procedure Laterality Date   ABDOMINAL SURGERY     [phone]   BACK SURGERY     x 8 -,multiple fusions(cervical to lumbar)   BILIARY STENT PLACEMENT  02/07/2019   Procedure: BILIARY STENT PLACEMENT;  Surgeon: Ladene Artist, MD;   Location: [facility] ENDOSCOPY;  Service: Endoscopy;;   CHOLECYSTECTOMY N/A 02/02/2019   Procedure: attempted LAPAROSCOPIC CHOLECYSTECTOMY;  Surgeon: Coralie Keens, MD;  Location: [place];  Service: General;  Laterality: N/A;   CHOLECYSTECTOMY  02/02/2019   Procedure: Cholecystectomy;  Surgeon: Coralie Keens, MD;  Location: [place];  Service: General;;   COLONOSCOPY     COLONOSCOPY WITH PROPOFOL N/A 06/14/2014   Procedure: COLONOSCOPY WITH PROPOFOL;  Surgeon: Gatha Mayer, MD;  Location: [facility] ENDOSCOPY;  Service: Endoscopy;  Laterality: N/A;   CORONARY STENT INTERVENTION N/A 01/22/2022   Procedure: CORONARY STENT INTERVENTION;  Surgeon: Burnell Blanks, MD;  Location: [place] [facility];  Service: Cardiovascular;  Laterality: N/A;   DIAGNOSTIC LAPAROSCOPY     ERCP N/A 02/07/2019   Procedure: ENDOSCOPIC RETROGRADE CHOLANGIOPANCREATOGRAPHY (ERCP);  Surgeon: Ladene Artist, MD;  Location[facility]COPY;  Service: Endoscopy;  Laterality: N/A;   ERCP N/A 04/07/2019   Procedure: ENDOSCOPIC RETROGRADE CHOLANGIOPANCREATOGRAPHY (ERCP);  Surgeon: Gatha Mayer, MD;  Location: [facility]OS[facility];  Service: Endoscopy;  Laterality: N/A;   ESOPHAGOGASTRODUODENOSCOPY N/A 01/03/2013   Procedure: ESOPHAGOGASTRODUODENOSCOPY (EGD);  Surgeon: Arta Silence, MD;  Location: [facility] ORS;  Service: Endoscopy;  Laterality: N/A;   ESOPHAGOGASTRODUODENOSCOPY N/A 01/03/2013   Procedure: ESOPHAGOGASTRODUODENOSCOPY (  EGD);  Surgeon: Arta Silence, MD;  Location: [facility] ENDOSCOPY;  Service: Endoscopy;  Laterality: N/A;   ESOPHAGOGASTRODUODENOSCOPY N/A 01/23/2013   Procedure: ESOPHAGOGASTRODUODENOSCOPY (EGD);  Surgeon: Phillip Ng, MD;  Location: [facility] ENDOSCOPY;  Service: Endoscopy;  Laterality: N/A;   ESOPHAGOGASTRODUODENOSCOPY Left 01/27/2013   Procedure: ESOPHAGOGASTRODUODENOSCOPY (EGD);  Surgeon: Phillip Ng, MD;  Location: [place];  Service: Endoscopy;  Laterality: Left;   ESOPHAGOGASTRODUODENOSCOPY N/A 06/14/2014    Procedure: ESOPHAGOGASTRODUODENOSCOPY (EGD);  Surgeon: Gatha Mayer, MD;  Location: [facility];  Service: Endoscopy;  Laterality: N/A;   ESOPHAGOSCOPY N/A 01/01/2013   Procedure: ESOPHAGOSCOPY;  Surgeon: Jeryl Columbia, MD;  Location: [place];  Service: Endoscopy;  Laterality: N/A;   GASTRIC BYPASS  1977   Reversed in 1992 and revision [place] N/A 04/07/2019   Procedure: GASTROINTESTINAL STENT REMOVAL;  Surgeon: Gatha Mayer, MD;  Location: [facility] ENDOSCOPY;  Service: Endoscopy;  Laterality: N/A;   HYDROCELE EXCISION  1996   x 2    KNEE SURGERY Left 2004   LEFT HEART CATH AND CORONARY ANGIOGRAPHY N/A 01/22/2022   Procedure: LEFT HEART CATH AND CORONARY ANGIOGRAPHY;  Surgeon: Burnell Blanks, MD;  Location: [place] CV LAB;  Service: Cardiovascular;  Laterality: N/A;    Current Medications: No outpatient medications have been marked as taking for the 06/12/22 encounter (Appointment) with O'Neal, Cassie Freer, MD.     Allergies:    Chlorzoxazone, Piroxicam, Latex, and Adhesive [tape]   Social History: Social History   Socioeconomic History   Marital status: Married    Spouse name: Phillip Maldonado   Number of children: 6   Years of education: Not on file   Highest education level: Master's degree (e.g., MA, MS, MEng, MEd, MSW, MBA)  Occupational History   Occupation: DISABLED  Tobacco Use   Smoking status: Never   Smokeless tobacco: Never  Vaping Use   Vaping Use: Never used  Substance and Sexual Activity   Alcohol use: No    Alcohol/week: 0.0 standard drinks of alcohol   Drug use: No   Sexual activity: Not Currently  Other Topics Concern   Not on file  Social History Narrative   Patient is divorced, he has a total of 6 children 2 of which are deceased. He is a retired Conservation officer, historic buildings. History of alcoholism. No alcohol now. 2 caffeinated beverages daily.    Married for 3rd time   Social Determinants of Health   Financial Resource Strain:  Low Risk  (01/23/2022)   Overall Financial Resource Strain (CARDIA)    Difficulty of Paying Living Expenses: Not hard at all  Food Insecurity: No Food Insecurity (01/22/2022)   Hunger Vital Sign    Worried About Running Out of Food in the Last Year: Never true    Ran Out of Food in the Last Year: Never true  Transportation Needs: No Transportation Needs (01/22/2022)   PRAPARE - Hydrologist (Medical): No    Lack of Transportation (Non-Medical): No  Physical Activity: Not on file  Stress: Not on file  Social Connections: Not on file     Family History: The patient's ***family history includes Arthritis in his mother, sister, and sister; COPD in his father; Cancer in his maternal grandmother; Depression in his son and son; Diabetes in his daughter; Heart disease in his father, maternal uncle, maternal uncle, and paternal uncle; High blood pressure in his sister, sister, and son; Hypertension in his father; Osteoporosis in his  mother; Skin cancer in his father; Stroke in his father.  ROS:   All other ROS reviewed and negative. Pertinent positives noted in the HPI.     EKGs/Labs/Other Studies Reviewed:   The following studies were personally reviewed by me today:  EKG:  EKG is *** ordered today.  The ekg ordered today demonstrates ***, and was personally reviewed by me.   TTE 01/22/2022  1. Normal to low normal EF endocardium not well seen even with definity  Cannot r/o lateral wall hypokinesis . Left ventricular ejection fraction,  by estimation, is Not well seen%. The left ventricle has normal function.  The left ventricle has no  regional wall motion abnormalities. Left ventricular diastolic parameters  are consistent with Grade I diastolic dysfunction (impaired relaxation).   2. Right ventricular systolic function was not well visualized. The right  ventricular size is not well visualized. There is normal pulmonary artery  systolic pressure.   3. The  mitral valve is abnormal. Trivial mitral valve regurgitation. No  evidence of mitral stenosis.   4. The aortic valve is tricuspid. Aortic valve regurgitation is not  visualized. No aortic stenosis is present.   5. 4.3 cm my measurement . Aortic dilatation noted. There is moderate  dilatation of the aortic root, measuring 43 mm.   6. The inferior vena cava is normal in size with greater than 50%  respiratory variability, suggesting right atrial pressure of 3 mmHg.   Recent Labs: 01/23/2022: Hemoglobin 14.1; Magnesium 1.6; Platelets 186; TSH 1.433 02/02/2022: B Natriuretic Peptide 99.4; BUN 22; Creatinine, Ser 1.40; Potassium 4.0; Sodium 140   Recent Lipid Panel    Component Value Date/Time   CHOL 130 01/22/2022 0030   CHOL 223 (H) 02/12/2015 1424   TRIG 335 (H) 01/22/2022 0030   HDL 37 (L) 01/22/2022 0030   HDL 38 (L) 02/12/2015 1424   CHOLHDL 3.5 01/22/2022 0030   VLDL 67 (H) 01/22/2022 0030   LDLCALC 26 01/22/2022 0030   LDLCALC 116 (H) 02/12/2015 1424    Physical Exam:   VS:  There were no vitals taken for this visit.   Wt Readings from Last 3 Encounters:  05/14/22 (!) 384 lb (174.2 kg)  02/11/22 (!) 390 lb (176.9 kg)  02/02/22 (!) 389 lb 6.4 oz (176.6 kg)    General: Well nourished, well developed, in no acute distress Head: Atraumatic, normal size  Eyes: PEERLA, EOMI  Neck: Supple, no JVD Endocrine: No thryomegaly Cardiac: Normal S1, S2; RRR; no murmurs, rubs, or gallops Lungs: Clear to auscultation bilaterally, no wheezing, rhonchi or rales  Abd: Soft, nontender, no hepatomegaly  Ext: No edema, pulses 2+ Musculoskeletal: No deformities, BUE and BLE strength normal and equal Skin: Warm and dry, no rashes   Neuro: Alert and oriented to person, place, time, and situation, CNII-XII grossly intact, no focal deficits  Psych: Normal mood and affect   ASSESSMENT:   Phillip Maldonado is a 72 y.o. male who presents for the following: No diagnosis found.  PLAN:   There  are no diagnoses linked to this encounter.  {Are you ordering a CV Procedure (e.g. stress test, cath, DCCV, TEE, etc)?   Press F2        :[id number]  Disposition: No follow-ups on file.  Medication Adjustments/Labs and Tests Ordered: Current medicines are reviewed at length with the patient today.  Concerns regarding medicines are outlined above.  No orders of the defined types were placed in this encounter.  No orders of the defined  types were placed in this encounter.   There are no Patient Instructions on file for this visit.   Time Spent with Patient: I have spent a total of *** minutes with patient reviewing hospital notes, telemetry, EKGs, labs and examining the patient as well as establishing an assessment and plan that was discussed with the patient.  > 50% of time was spent in direct patient care.  Signed, Addison Naegeli. Audie Box, MD, [place]  [address], [place], [place] [address] [phone]  06/10/2022 1:22 PM

## 2022-06-12 ENCOUNTER — Ambulatory Visit (HOSPITAL_BASED_OUTPATIENT_CLINIC_OR_DEPARTMENT_OTHER): Payer: Medicare Other | Admitting: Cardiovascular Disease

## 2022-06-12 DIAGNOSIS — E782 Mixed hyperlipidemia: Secondary | ICD-10-CM

## 2022-06-12 DIAGNOSIS — I2510 Atherosclerotic heart disease of native coronary artery without angina pectoris: Secondary | ICD-10-CM

## 2022-06-12 DIAGNOSIS — I10 Essential (primary) hypertension: Secondary | ICD-10-CM

## 2022-06-12 DIAGNOSIS — I5032 Chronic diastolic (congestive) heart failure: Secondary | ICD-10-CM

## 2022-06-17 ENCOUNTER — Ambulatory Visit (INDEPENDENT_AMBULATORY_CARE_PROVIDER_SITE_OTHER): Payer: Medicare Other | Admitting: Internal Medicine

## 2022-06-22 ENCOUNTER — Ambulatory Visit: Payer: Medicare Other | Admitting: Cardiovascular Disease

## 2022-06-24 ENCOUNTER — Encounter (INDEPENDENT_AMBULATORY_CARE_PROVIDER_SITE_OTHER): Payer: Self-pay | Admitting: Internal Medicine

## 2022-06-24 ENCOUNTER — Ambulatory Visit (INDEPENDENT_AMBULATORY_CARE_PROVIDER_SITE_OTHER): Payer: Medicare Other | Admitting: Internal Medicine

## 2022-06-24 VITALS — BP 118/63 | HR 76 | Temp 98.2°F | Ht 66.0 in | Wt 382.0 lb

## 2022-06-24 DIAGNOSIS — G4733 Obstructive sleep apnea (adult) (pediatric): Secondary | ICD-10-CM | POA: Diagnosis not present

## 2022-06-24 DIAGNOSIS — E1169 Type 2 diabetes mellitus with other specified complication: Secondary | ICD-10-CM | POA: Diagnosis not present

## 2022-06-24 DIAGNOSIS — Z7985 Long-term (current) use of injectable non-insulin antidiabetic drugs: Secondary | ICD-10-CM

## 2022-06-24 DIAGNOSIS — Z6841 Body Mass Index (BMI) 40.0 and over, adult: Secondary | ICD-10-CM

## 2022-06-24 DIAGNOSIS — Z1331 Encounter for screening for depression: Secondary | ICD-10-CM | POA: Diagnosis not present

## 2022-06-24 DIAGNOSIS — K760 Fatty (change of) liver, not elsewhere classified: Secondary | ICD-10-CM

## 2022-06-24 DIAGNOSIS — R5383 Other fatigue: Secondary | ICD-10-CM

## 2022-06-24 DIAGNOSIS — Z9884 Bariatric surgery status: Secondary | ICD-10-CM

## 2022-06-24 DIAGNOSIS — I10 Essential (primary) hypertension: Secondary | ICD-10-CM | POA: Diagnosis not present

## 2022-06-24 DIAGNOSIS — R0602 Shortness of breath: Secondary | ICD-10-CM

## 2022-06-24 NOTE — Assessment & Plan Note (Signed)
I reviewed past imaging he does have evidence of hepatic steatosis on ultrasound.  He also has sleep apnea and therefore is at risk for hepatic impairment in the future.  Losing 15% of body weight may improve condition.  We will also counseled him on nutrition at his intake appointment.  He is also a candidate for incretin therapy.  We will calculate fibrosis score at the next visit.

## 2022-06-24 NOTE — Progress Notes (Incomplete Revision)
This note is pending transcription. Please see paper chart for other note components.  BP 118/63   Pulse 76   Temp 98.2 F (36.8 C)   Ht 5\' 6"  (1.676 m)   Wt (!) 382 lb (173.3 kg)   SpO2 96%   BMI 61.66 kg/m    Other fatigue -     Vitamin B12 -     CBC with Differential/Platelet  SOB (shortness of breath) on exertion  Depression screen  Type 2 diabetes mellitus with other specified complication, without long-term current use of insulin Assessment & Plan: His most recent A1c was 8.1 which is close to goal for age and comorbid conditions.  He is on Victoza, SGLT-2, metformin and rapid acting insulin.   Lab Results  Component Value Date   HGBA1C 8.1 (H) 01/22/2022   HGBA1C 8.6 (H) 01/21/2019   HGBA1C 7.2 (H) 10/01/2016   Lab Results  Component Value Date   LDLCALC 26 01/22/2022   CREATININE 1.40 (H) 02/02/2022   Losing 10 to 15% of body weight may improve condition.  We also have to monitor closely for hypoglycemia when he starts changing macronutrient distribution.  He may also benefit from switching to either Ozempic or Mounjaro if there are no coverage restrictions.  The latter to have a higher affinity for the hypothalamus so they are more effective for weight loss, they are also effective in treating hepatic steatosis.  I think he had problems with finding medication in the past which may or may not have improved.   Orders: -     Hemoglobin A1c -     Insulin, random -     Lipid Panel With LDL/HDL Ratio  Primary hypertension Assessment & Plan: Blood pressure at goal for age and risk category.  On amlodipine 10 mg, carvedilol 12.5 mg twice a day without adverse effects.  Most recent renal parameters stage 3A CKD.  Continue current regimen.  Losing 10% of body weight may improve blood pressure control.  Counseled on maintaining adequate hydration and monitoring for orthostatic hypotension.   OSA (obstructive sleep apnea)  NAFLD (nonalcoholic fatty liver  disease) Assessment & Plan: I reviewed past imaging he does have evidence of hepatic steatosis on ultrasound.  He also has sleep apnea and therefore is at risk for hepatic impairment in the future.  Losing 15% of body weight may improve condition.  We will also counseled him on nutrition at his intake appointment.  He is also a candidate for incretin therapy.  We will calculate fibrosis score at the next visit.  Orders: -     Comprehensive metabolic panel  Class 3 severe obesity with serious comorbidity and body mass index (BMI) of 60.0 to 69.9 in adult, unspecified obesity type -     TSH -     VITAMIN D 25 Hydroxy (Vit-D Deficiency, Fractures)  History of Roux-en-Y gastric bypass Assessment & Plan: He is a limited historian but it appears that he had possible gastric bypass surgery in 2005, developed some complications and had a revision to a possible Roux-en-Y.  We will review his records further time permitting if he indeed have Roux-en-Y he needs his vitamin levels checked at the next office visit.  I am also unsure if he is taking bariatric vitamins.  I also explained to patient that this may result in metabolic adaptations that will make weight loss very difficult at his age.

## 2022-06-24 NOTE — Assessment & Plan Note (Signed)
Blood pressure at goal for age and risk category.  On amlodipine 10 mg, carvedilol 12.5 mg twice a day without adverse effects.  Most recent renal parameters stage 3A CKD.  Continue current regimen.  Losing 10% of body weight may improve blood pressure control.  Counseled on maintaining adequate hydration and monitoring for orthostatic hypotension.

## 2022-06-24 NOTE — Assessment & Plan Note (Addendum)
His most recent A1c was 8.1 which is close to goal for age and comorbid conditions.  He is on Victoza, SGLT-2, metformin and rapid acting insulin.   Lab Results  Component Value Date   HGBA1C 8.1 (H) 01/22/2022   HGBA1C 8.6 (H) 01/21/2019   HGBA1C 7.2 (H) 10/01/2016   Lab Results  Component Value Date   LDLCALC 26 01/22/2022   CREATININE 1.40 (H) 02/02/2022   Losing 10 to 15% of body weight may improve condition.  We also have to monitor closely for hypoglycemia when he starts changing macronutrient distribution.  He may also benefit from switching to either Ozempic or Mounjaro if there are no coverage restrictions.  The latter to have a higher affinity for the hypothalamus so they are more effective for weight loss, they are also effective in treating hepatic steatosis.  I think he had problems with finding medication in the past which may or may not have improved.

## 2022-06-24 NOTE — Progress Notes (Addendum)
This note is pending transcription. Please see paper chart for other note components.  BP 118/63   Pulse 76   Temp 98.2 F (36.8 C)   Ht 5' 6" (1.676 m)   Wt (!) 382 lb (173.3 kg)   SpO2 96%   BMI 61.66 kg/m    Other fatigue -     Vitamin B12 -     CBC with Differential/Platelet  SOB (shortness of breath) on exertion  Depression screen  Type 2 diabetes mellitus with other specified complication, without long-term current use of insulin Assessment & Plan: His most recent A1c was 8.1 which is close to goal for age and comorbid conditions.  He is on Victoza, SGLT-2, metformin and rapid acting insulin.   Lab Results  Component Value Date   HGBA1C 8.1 (H) 01/22/2022   HGBA1C 8.6 (H) 01/21/2019   HGBA1C 7.2 (H) 10/01/2016   Lab Results  Component Value Date   LDLCALC 26 01/22/2022   CREATININE 1.40 (H) 02/02/2022   Losing 10 to 15% of body weight may improve condition.  We also have to monitor closely for hypoglycemia when he starts changing macronutrient distribution.  He may also benefit from switching to either Ozempic or Mounjaro if there are no coverage restrictions.  The latter to have a higher affinity for the hypothalamus so they are more effective for weight loss, they are also effective in treating hepatic steatosis.  I think he had problems with finding medication in the past which may or may not have improved.   Orders: -     Hemoglobin A1c -     Insulin, random -     Lipid Panel With LDL/HDL Ratio  Primary hypertension Assessment & Plan: Blood pressure at goal for age and risk category.  On amlodipine 10 mg, carvedilol 12.5 mg twice a day without adverse effects.  Most recent renal parameters stage 3A CKD.  Continue current regimen.  Losing 10% of body weight may improve blood pressure control.  Counseled on maintaining adequate hydration and monitoring for orthostatic hypotension.   OSA (obstructive sleep apnea)  NAFLD (nonalcoholic fatty liver  disease) Assessment & Plan: I reviewed past imaging he does have evidence of hepatic steatosis on ultrasound.  He also has sleep apnea and therefore is at risk for hepatic impairment in the future.  Losing 15% of body weight may improve condition.  We will also counseled him on nutrition at his intake appointment.  He is also a candidate for incretin therapy.  We will calculate fibrosis score at the next visit.  Orders: -     Comprehensive metabolic panel  Class 3 severe obesity with serious comorbidity and body mass index (BMI) of 60.0 to 69.9 in adult, unspecified obesity type -     TSH -     VITAMIN D 25 Hydroxy (Vit-D Deficiency, Fractures)  History of Roux-en-Y gastric bypass Assessment & Plan: He is a limited historian but it appears that he had possible gastric bypass surgery in 2005, developed some complications and had a revision to a possible Roux-en-Y.  We will review his records further time permitting if he indeed have Roux-en-Y he needs his vitamin levels checked at the next office visit.  I am also unsure if he is taking bariatric vitamins.  I also explained to patient that this may result in metabolic adaptations that will make weight loss very difficult at his age.    

## 2022-06-24 NOTE — Assessment & Plan Note (Signed)
He is a limited historian but it appears that he had possible gastric bypass surgery in 2005, developed some complications and had a revision to a possible Roux-en-Y.  We will review his records further time permitting if he indeed have Roux-en-Y he needs his vitamin levels checked at the next office visit.  I am also unsure if he is taking bariatric vitamins.  I also explained to patient that this may result in metabolic adaptations that will make weight loss very difficult at his age.

## 2022-06-25 LAB — LIPID PANEL WITH LDL/HDL RATIO
Cholesterol, Total: 210 mg/dL — ABNORMAL HIGH (ref 100–199)
HDL: 39 mg/dL — ABNORMAL LOW (ref >39)
LDL Chol Calc (NIH): 111 mg/dL — ABNORMAL HIGH (ref 0–99)
LDL/HDL Ratio: 2.8 ratio (ref 0.0–3.6)
Triglycerides: 350 mg/dL — ABNORMAL HIGH (ref 0–149)
VLDL Cholesterol Cal: 60 mg/dL — ABNORMAL HIGH (ref 5–40)

## 2022-06-25 LAB — COMPREHENSIVE METABOLIC PANEL
ALT: 24 IU/L (ref 0–44)
AST: 21 IU/L (ref 0–40)
Albumin/Globulin Ratio: 1.4 (ref 1.2–2.2)
Albumin: 4.1 g/dL (ref 3.8–4.8)
Alkaline Phosphatase: 60 IU/L (ref 44–121)
BUN/Creatinine Ratio: 16 (ref 10–24)
BUN: 25 mg/dL (ref 8–27)
Bilirubin Total: 0.3 mg/dL (ref 0.0–1.2)
CO2: 24 mmol/L (ref 20–29)
Calcium: 9.6 mg/dL (ref 8.6–10.2)
Chloride: 103 mmol/L (ref 96–106)
Creatinine, Ser: 1.57 mg/dL — ABNORMAL HIGH (ref 0.76–1.27)
Globulin, Total: 2.9 g/dL (ref 1.5–4.5)
Glucose: 110 mg/dL — ABNORMAL HIGH (ref 70–99)
Potassium: 4.9 mmol/L (ref 3.5–5.2)
Sodium: 142 mmol/L (ref 134–144)
Total Protein: 7 g/dL (ref 6.0–8.5)
eGFR: 47 mL/min/{1.73_m2} — ABNORMAL LOW (ref >59)

## 2022-06-25 LAB — CBC WITH DIFFERENTIAL/PLATELET
Basophils Absolute: 0.1 10*3/uL (ref 0.0–0.2)
Basos: 1 %
EOS (ABSOLUTE): 0.3 10*3/uL (ref 0.0–0.4)
Eos: 3 %
Hematocrit: 45.2 % (ref 37.5–51.0)
Hemoglobin: 14.3 g/dL (ref 13.0–17.7)
Immature Grans (Abs): 0 10*3/uL (ref 0.0–0.1)
Immature Granulocytes: 0 %
Lymphocytes Absolute: 2.5 10*3/uL (ref 0.7–3.1)
Lymphs: 26 %
MCH: 29.9 pg (ref 26.6–33.0)
MCHC: 31.6 g/dL (ref 31.5–35.7)
MCV: 95 fL (ref 79–97)
Monocytes Absolute: 0.7 10*3/uL (ref 0.1–0.9)
Monocytes: 7 %
Neutrophils Absolute: 6.2 10*3/uL (ref 1.4–7.0)
Neutrophils: 63 %
Platelets: 244 10*3/uL (ref 150–450)
RBC: 4.78 x10E6/uL (ref 4.14–5.80)
RDW: 13.4 % (ref 11.6–15.4)
WBC: 9.7 10*3/uL (ref 3.4–10.8)

## 2022-06-25 LAB — HEMOGLOBIN A1C
Est. average glucose Bld gHb Est-mCnc: 171 mg/dL
Hgb A1c MFr Bld: 7.6 % — ABNORMAL HIGH (ref 4.8–5.6)

## 2022-06-25 LAB — VITAMIN D 25 HYDROXY (VIT D DEFICIENCY, FRACTURES): Vit D, 25-Hydroxy: 31.6 ng/mL (ref 30.0–100.0)

## 2022-06-25 LAB — INSULIN, RANDOM: INSULIN: 15.5 u[IU]/mL (ref 2.6–24.9)

## 2022-06-25 LAB — VITAMIN B12: Vitamin B-12: 1844 pg/mL — ABNORMAL HIGH (ref 232–1245)

## 2022-06-25 LAB — TSH: TSH: 1.78 u[IU]/mL (ref 0.450–4.500)

## 2022-06-25 NOTE — Progress Notes (Signed)
Hi Dr. Coral Else,  I'm Dr. Rikki Spearing at the [facility].  Phillip Maldonado was referred to me for assistance with weight loss. I was looking over his labs and it seems that he has had a decline in GFR over the last 4-5 months.  I suspect he may have prerenal azotemia.  He is on several medications medications that may contribute to this.  Can you have someone in your office follow-up with patient to see if his heart failure regimen needs to be adjusted?  Thank you for your time and consideration.  Dr. Rikki Spearing

## 2022-07-07 ENCOUNTER — Ambulatory Visit (INDEPENDENT_AMBULATORY_CARE_PROVIDER_SITE_OTHER): Payer: Medicare Other | Admitting: Internal Medicine

## 2022-07-13 ENCOUNTER — Other Ambulatory Visit: Payer: Self-pay | Admitting: Home Health

## 2022-07-13 ENCOUNTER — Other Ambulatory Visit: Payer: Self-pay | Admitting: Physician Assistant

## 2022-07-13 ENCOUNTER — Other Ambulatory Visit: Payer: Self-pay | Admitting: Cardiovascular Disease

## 2022-07-14 ENCOUNTER — Encounter (HOSPITAL_COMMUNITY): Payer: Self-pay

## 2022-07-14 ENCOUNTER — Inpatient Hospital Stay (HOSPITAL_COMMUNITY)
Admission: EM | Admit: 2022-07-14 | Discharge: 2022-07-17 | DRG: 871 | Disposition: A | Discharge status: Home Health | Payer: Medicare Other | Attending: Internal Medicine | Admitting: Internal Medicine

## 2022-07-14 ENCOUNTER — Emergency Department (HOSPITAL_COMMUNITY): Payer: Medicare Other

## 2022-07-14 ENCOUNTER — Other Ambulatory Visit: Payer: Self-pay

## 2022-07-14 DIAGNOSIS — I4891 Unspecified atrial fibrillation: Secondary | ICD-10-CM | POA: Diagnosis present

## 2022-07-14 DIAGNOSIS — R531 Weakness: Secondary | ICD-10-CM | POA: Diagnosis present

## 2022-07-14 DIAGNOSIS — Z8261 Family history of arthritis: Secondary | ICD-10-CM

## 2022-07-14 DIAGNOSIS — I509 Heart failure, unspecified: Secondary | ICD-10-CM

## 2022-07-14 DIAGNOSIS — D72829 Elevated white blood cell count, unspecified: Secondary | ICD-10-CM | POA: Diagnosis present

## 2022-07-14 DIAGNOSIS — T17990A Other foreign object in respiratory tract, part unspecified in causing asphyxiation, initial encounter: Secondary | ICD-10-CM | POA: Diagnosis present

## 2022-07-14 DIAGNOSIS — J9601 Acute respiratory failure with hypoxia: Secondary | ICD-10-CM | POA: Diagnosis present

## 2022-07-14 DIAGNOSIS — I252 Old myocardial infarction: Secondary | ICD-10-CM

## 2022-07-14 DIAGNOSIS — Z808 Family history of malignant neoplasm of other organs or systems: Secondary | ICD-10-CM

## 2022-07-14 DIAGNOSIS — E1140 Type 2 diabetes mellitus with diabetic neuropathy, unspecified: Secondary | ICD-10-CM | POA: Diagnosis present

## 2022-07-14 DIAGNOSIS — I2510 Atherosclerotic heart disease of native coronary artery without angina pectoris: Secondary | ICD-10-CM | POA: Diagnosis present

## 2022-07-14 DIAGNOSIS — J9811 Atelectasis: Secondary | ICD-10-CM | POA: Diagnosis present

## 2022-07-14 DIAGNOSIS — Z7902 Long term (current) use of antithrombotics/antiplatelets: Secondary | ICD-10-CM

## 2022-07-14 DIAGNOSIS — A4181 Sepsis due to Enterococcus: Principal | ICD-10-CM | POA: Diagnosis present

## 2022-07-14 DIAGNOSIS — W44F9XA Other object of natural or organic material, entering into or through a natural orifice, initial encounter: Secondary | ICD-10-CM | POA: Diagnosis present

## 2022-07-14 DIAGNOSIS — K5900 Constipation, unspecified: Secondary | ICD-10-CM | POA: Diagnosis present

## 2022-07-14 DIAGNOSIS — N183 Chronic kidney disease, stage 3 unspecified: Secondary | ICD-10-CM | POA: Diagnosis present

## 2022-07-14 DIAGNOSIS — I5032 Chronic diastolic (congestive) heart failure: Secondary | ICD-10-CM

## 2022-07-14 DIAGNOSIS — Z955 Presence of coronary angioplasty implant and graft: Secondary | ICD-10-CM

## 2022-07-14 DIAGNOSIS — I10 Essential (primary) hypertension: Secondary | ICD-10-CM | POA: Diagnosis present

## 2022-07-14 DIAGNOSIS — N3000 Acute cystitis without hematuria: Secondary | ICD-10-CM | POA: Diagnosis not present

## 2022-07-14 DIAGNOSIS — Z8711 Personal history of peptic ulcer disease: Secondary | ICD-10-CM

## 2022-07-14 DIAGNOSIS — N1831 Chronic kidney disease, stage 3a: Secondary | ICD-10-CM | POA: Diagnosis present

## 2022-07-14 DIAGNOSIS — A419 Sepsis, unspecified organism: Secondary | ICD-10-CM | POA: Diagnosis present

## 2022-07-14 DIAGNOSIS — Z8249 Family history of ischemic heart disease and other diseases of the circulatory system: Secondary | ICD-10-CM

## 2022-07-14 DIAGNOSIS — Z7985 Long-term (current) use of injectable non-insulin antidiabetic drugs: Secondary | ICD-10-CM

## 2022-07-14 DIAGNOSIS — H9190 Unspecified hearing loss, unspecified ear: Secondary | ICD-10-CM | POA: Diagnosis present

## 2022-07-14 DIAGNOSIS — R6520 Severe sepsis without septic shock: Secondary | ICD-10-CM | POA: Diagnosis present

## 2022-07-14 DIAGNOSIS — R5381 Other malaise: Secondary | ICD-10-CM | POA: Diagnosis present

## 2022-07-14 DIAGNOSIS — N1832 Chronic kidney disease, stage 3b: Secondary | ICD-10-CM

## 2022-07-14 DIAGNOSIS — N400 Enlarged prostate without lower urinary tract symptoms: Secondary | ICD-10-CM | POA: Diagnosis present

## 2022-07-14 DIAGNOSIS — Z794 Long term (current) use of insulin: Secondary | ICD-10-CM

## 2022-07-14 DIAGNOSIS — K760 Fatty (change of) liver, not elsewhere classified: Secondary | ICD-10-CM | POA: Diagnosis present

## 2022-07-14 DIAGNOSIS — I5042 Chronic combined systolic (congestive) and diastolic (congestive) heart failure: Secondary | ICD-10-CM | POA: Diagnosis present

## 2022-07-14 DIAGNOSIS — N390 Urinary tract infection, site not specified: Secondary | ICD-10-CM | POA: Diagnosis present

## 2022-07-14 DIAGNOSIS — F32A Depression, unspecified: Secondary | ICD-10-CM | POA: Diagnosis present

## 2022-07-14 DIAGNOSIS — Z818 Family history of other mental and behavioral disorders: Secondary | ICD-10-CM

## 2022-07-14 DIAGNOSIS — E1122 Type 2 diabetes mellitus with diabetic chronic kidney disease: Secondary | ICD-10-CM | POA: Diagnosis present

## 2022-07-14 DIAGNOSIS — G4733 Obstructive sleep apnea (adult) (pediatric): Secondary | ICD-10-CM | POA: Diagnosis present

## 2022-07-14 DIAGNOSIS — E119 Type 2 diabetes mellitus without complications: Secondary | ICD-10-CM

## 2022-07-14 DIAGNOSIS — Z86718 Personal history of other venous thrombosis and embolism: Secondary | ICD-10-CM

## 2022-07-14 DIAGNOSIS — Z825 Family history of asthma and other chronic lower respiratory diseases: Secondary | ICD-10-CM

## 2022-07-14 DIAGNOSIS — Z9884 Bariatric surgery status: Secondary | ICD-10-CM | POA: Diagnosis not present

## 2022-07-14 DIAGNOSIS — I130 Hypertensive heart and chronic kidney disease with heart failure and stage 1 through stage 4 chronic kidney disease, or unspecified chronic kidney disease: Secondary | ICD-10-CM | POA: Diagnosis present

## 2022-07-14 DIAGNOSIS — I872 Venous insufficiency (chronic) (peripheral): Secondary | ICD-10-CM | POA: Diagnosis present

## 2022-07-14 DIAGNOSIS — Z7984 Long term (current) use of oral hypoglycemic drugs: Secondary | ICD-10-CM

## 2022-07-14 DIAGNOSIS — Z79899 Other long term (current) drug therapy: Secondary | ICD-10-CM

## 2022-07-14 DIAGNOSIS — Z6841 Body Mass Index (BMI) 40.0 and over, adult: Secondary | ICD-10-CM | POA: Diagnosis not present

## 2022-07-14 DIAGNOSIS — I5020 Unspecified systolic (congestive) heart failure: Secondary | ICD-10-CM | POA: Diagnosis not present

## 2022-07-14 DIAGNOSIS — Z1152 Encounter for screening for COVID-19: Secondary | ICD-10-CM | POA: Diagnosis not present

## 2022-07-14 DIAGNOSIS — Z7982 Long term (current) use of aspirin: Secondary | ICD-10-CM

## 2022-07-14 DIAGNOSIS — E7800 Pure hypercholesterolemia, unspecified: Secondary | ICD-10-CM | POA: Diagnosis present

## 2022-07-14 DIAGNOSIS — F419 Anxiety disorder, unspecified: Secondary | ICD-10-CM | POA: Diagnosis present

## 2022-07-14 DIAGNOSIS — Z91199 Patient's noncompliance with other medical treatment and regimen due to unspecified reason: Secondary | ICD-10-CM

## 2022-07-14 DIAGNOSIS — Z8262 Family history of osteoporosis: Secondary | ICD-10-CM

## 2022-07-14 DIAGNOSIS — Z833 Family history of diabetes mellitus: Secondary | ICD-10-CM

## 2022-07-14 DIAGNOSIS — Z83438 Family history of other disorder of lipoprotein metabolism and other lipidemia: Secondary | ICD-10-CM

## 2022-07-14 DIAGNOSIS — Z87440 Personal history of urinary (tract) infections: Secondary | ICD-10-CM

## 2022-07-14 DIAGNOSIS — N281 Cyst of kidney, acquired: Secondary | ICD-10-CM | POA: Diagnosis present

## 2022-07-14 DIAGNOSIS — F988 Other specified behavioral and emotional disorders with onset usually occurring in childhood and adolescence: Secondary | ICD-10-CM | POA: Diagnosis present

## 2022-07-14 DIAGNOSIS — Z823 Family history of stroke: Secondary | ICD-10-CM

## 2022-07-14 DIAGNOSIS — E66813 Obesity, class 3: Secondary | ICD-10-CM

## 2022-07-14 DIAGNOSIS — Z8619 Personal history of other infectious and parasitic diseases: Secondary | ICD-10-CM

## 2022-07-14 LAB — BRAIN NATRIURETIC PEPTIDE: B Natriuretic Peptide: 74.8 pg/mL (ref 0.0–100.0)

## 2022-07-14 LAB — CBC WITH DIFFERENTIAL/PLATELET
Abs Immature Granulocytes: 0.04 10*3/uL (ref 0.00–0.07)
Basophils Absolute: 0 10*3/uL (ref 0.0–0.1)
Basophils Relative: 0 %
Eosinophils Absolute: 0 10*3/uL (ref 0.0–0.5)
Eosinophils Relative: 0 %
HCT: 42.6 % (ref 39.0–52.0)
Hemoglobin: 14 g/dL (ref 13.0–17.0)
Immature Granulocytes: 0 %
Lymphocytes Relative: 6 %
Lymphs Abs: 0.7 10*3/uL (ref 0.7–4.0)
MCH: 31 pg (ref 26.0–34.0)
MCHC: 32.9 g/dL (ref 30.0–36.0)
MCV: 94.5 fL (ref 80.0–100.0)
Monocytes Absolute: 0.7 10*3/uL (ref 0.1–1.0)
Monocytes Relative: 6 %
Neutro Abs: 9.4 10*3/uL — ABNORMAL HIGH (ref 1.7–7.7)
Neutrophils Relative %: 88 %
Platelets: 164 10*3/uL (ref 150–400)
RBC: 4.51 MIL/uL (ref 4.22–5.81)
RDW: 14.7 % (ref 11.5–15.5)
WBC: 10.8 10*3/uL — ABNORMAL HIGH (ref 4.0–10.5)
nRBC: 0 % (ref 0.0–0.2)

## 2022-07-14 LAB — URINALYSIS, W/ REFLEX TO CULTURE (INFECTION SUSPECTED)
Bacteria, UA: NONE SEEN
Bilirubin Urine: NEGATIVE
Glucose, UA: >=500 mg/dL — AB
Ketones, ur: NEGATIVE mg/dL
Nitrite: POSITIVE — AB
Protein, ur: 100 mg/dL — AB
Specific Gravity, Urine: 1.021 (ref 1.005–1.030)
WBC, UA: >50 WBC/hpf (ref 0–5)
pH: 5 (ref 5.0–8.0)

## 2022-07-14 LAB — COMPREHENSIVE METABOLIC PANEL
ALT: 20 U/L (ref 0–44)
AST: 26 U/L (ref 15–41)
Albumin: 3.3 g/dL — ABNORMAL LOW (ref 3.5–5.0)
Alkaline Phosphatase: 41 U/L (ref 38–126)
Anion gap: 16 — ABNORMAL HIGH (ref 5–15)
BUN: 31 mg/dL — ABNORMAL HIGH (ref 8–23)
CO2: 22 mmol/L (ref 22–32)
Calcium: 9.3 mg/dL (ref 8.9–10.3)
Chloride: 100 mmol/L (ref 98–111)
Creatinine, Ser: 1.57 mg/dL — ABNORMAL HIGH (ref 0.61–1.24)
GFR, Estimated: 47 mL/min — ABNORMAL LOW (ref >60)
Glucose, Bld: 161 mg/dL — ABNORMAL HIGH (ref 70–99)
Potassium: 4.5 mmol/L (ref 3.5–5.1)
Sodium: 138 mmol/L (ref 135–145)
Total Bilirubin: 1 mg/dL (ref 0.3–1.2)
Total Protein: 6.8 g/dL (ref 6.5–8.1)

## 2022-07-14 LAB — CBG MONITORING, ED: Glucose-Capillary: 171 mg/dL — ABNORMAL HIGH (ref 70–99)

## 2022-07-14 LAB — TROPONIN I (HIGH SENSITIVITY)
Troponin I (High Sensitivity): 15 ng/L (ref <18)
Troponin I (High Sensitivity): 5 ng/L (ref <18)

## 2022-07-14 MED ORDER — SODIUM CHLORIDE 0.9 % IV SOLN
3.0000 g | Freq: Once | INTRAVENOUS | Status: AC
  Administered 2022-07-14: 3 g via INTRAVENOUS
  Filled 2022-07-14: qty 8

## 2022-07-14 MED ORDER — INSULIN ASPART 100 UNIT/ML IJ SOLN
0.0000 [IU] | INTRAMUSCULAR | Status: DC
  Administered 2022-07-15 (×3): 2 [IU] via SUBCUTANEOUS
  Administered 2022-07-15 – 2022-07-16 (×4): 3 [IU] via SUBCUTANEOUS
  Administered 2022-07-16 (×3): 2 [IU] via SUBCUTANEOUS
  Administered 2022-07-16: 3 [IU] via SUBCUTANEOUS
  Administered 2022-07-16 – 2022-07-17 (×4): 2 [IU] via SUBCUTANEOUS

## 2022-07-14 NOTE — Assessment & Plan Note (Signed)
Continue Lipitor 80 mg a day.

## 2022-07-14 NOTE — Assessment & Plan Note (Signed)
-  SIRS criteria met with  elevated white blood cell count,       Component Value Date/Time   WBC 10.8 (H) 07/14/2022 1258   LYMPHSABS 0.7 07/14/2022 1258   LYMPHSABS 2.5 06/24/2022 1056     tachycardia     RR >20 Today's Vitals   07/14/22 1930 07/14/22 1955 07/14/22 2000 07/14/22 2138  BP: 125/86 135/88 120/67   Pulse: (!) 101 (!) 105 (!) 106   Resp: (!) 28 (!) 22    Temp:    98.2 F (36.8 C)  TempSrc:    Oral  SpO2: 95% 95% 93%   Weight:      Height:      PainSc:       Body mass index is 61.56 kg/m.      The recent clinical data is shown below. Vitals:   07/14/22 1930 07/14/22 1955 07/14/22 2000 07/14/22 2138  BP: 125/86 135/88 120/67   Pulse: (!) 101 (!) 105 (!) 106   Resp: (!) 28 (!) 22    Temp:    98.2 F (36.8 C)  TempSrc:    Oral  SpO2: 95% 95% 93%   Weight:      Height:         -Most likely source being:  urinary,   Patient meeting criteria for Severe sepsis with    evidence of end organ damage/organ dysfunction such as    Acute hypoxia requiring new supplemental oxygen, SpO2: 93 %      - Obtain serial lactic acid and procalcitonin level.  - Initiated IV antibiotics in ER: Antibiotics Given (last 72 hours)     Date/Time Action Medication Dose Rate   07/14/22 2157 New Bag/Given   Ampicillin-Sulbactam (UNASYN) 3 g in sodium chloride 0.9 % 100 mL IVPB 3 g 200 mL/hr       Will continue  on : Unasyn   - await results of blood and urine culture  - Rehydrate   Intravenous fluids were administered    11:45 PM

## 2022-07-14 NOTE — Assessment & Plan Note (Signed)
Allow permissive hypertension given soft blood pressures today

## 2022-07-14 NOTE — ED Provider Notes (Signed)
Care of patient received from prior provider at 3:11 PM, please see their note for complete H/P and care plan.  Received handoff per ED course.  Clinical Course as of 07/14/22 1511  Tue Jul 14, 2022  1509 Stable SOB Low BP decreased O2 sats with EMS Recently finished UTI Treatment with NH in OP setting Finished AB x2 Still havbing malaise HX CHF/CKD Labs/XR OK and pending UA [CC]    Clinical Course User Index [CC] Phillip Ade, MD   Reassessed at bedside.  He is diffusely weak, he is on 2 L nasal cannula does not wear oxygen at baseline.  He is covered in urine. I revealed his external culture reports and he has tested positive Enterococcus.  He has been on 10 days of Omnicef and still symptomatic. By lab results, it appears it would be sensitive to ampicillin which was initiated and IV Unasyn. Given failure of outpatient medical therapy he is going to require admission for further diagnostic care and management.   Phillip Ade, MD 07/14/22 [phone]

## 2022-07-14 NOTE — Assessment & Plan Note (Signed)
Order sliding scale hold p.o. medications Hold Farxiga Hold metformin

## 2022-07-14 NOTE — H&P (Incomplete)
Phillip Maldonado ZOX:[id number] DOB: 1950-09-06 DOA: 07/14/2022     PCP: Amil Amen, MD   Outpatient Specialists:   CARDS:   Dr. Reatha Harps, MD  NEphrology: *  Dr. NEurology *   Dr. Pulmonary *  Dr.  Oncology * Dr. Sandria Manly* Dr.  Deboraha Sprang, [facility]) No care team member to display Urology Dr. *  Patient arrived to ER on 07/14/22 at 1234 Referred by Attending Glyn Ade, MD   Patient coming from:    home Lives With family    Chief Complaint:   Chief Complaint  Patient presents with  . Shortness of Breath  . Weakness    HPI: Phillip Maldonado is a 72 y.o. male with medical history significant of morbidly obese, DM2, HLD , HTN , systolic/diastolic CHF, venous stasis dermatitis, CKD, OSA nolonger on CPAP, NAFLD,  History of A-fib  Presented with  confusion, dyuria Patient comes in from home via EMS complaining of weakness shortness of breath for the past day noted to be hypotensive with a low oxygen saturation in 90s.  Has been having some chills but no fever no nausea no vomiting no diarrhea no chest pain Noted leg edema.  He has history of CHF and recent UTI.  UTI was treated was Enterococcus faecalis he is not sure what medication has been taking for this. Has not been able to comply his Lasix urine or urinary complaints. History of alcohol abuse he is in atrial fibrillation Seems he was treated with [facility]      Lab Results  Component Value Date   SARSCOV2NAA NEGATIVE 04/05/2019   SARSCOV2NAA NEGATIVE 01/20/2019   SARSCOV2NAA Not Detected 12/06/2018   SARSCOV2NAA Not Detected 11/17/2018        Regarding pertinent Chronic problems:    Hyperlipidemia -  on statins Lipitor (atorvastatin)  Lipid Panel     Component Value Date/Time   CHOL 210 (H) 06/24/2022 1056   TRIG 350 (H) 06/24/2022 1056   HDL 39 (L) 06/24/2022 1056   CHOLHDL 3.5 01/22/2022 0030   VLDL 67 (H) 01/22/2022 0030   LDLCALC 111 (H) 06/24/2022 1056   LABVLDL 60 (H) 06/24/2022 1056      HTN on norvasc, coreg, cozaar   chronic CHF diastolic/systolic/ combined -   EF 50% per cardiac catheterization from 2023 patient is currently on entresto spironolactone Torsemide and Farxiga last echo Recent Results (from the past 81191 hour(s))  ECHOCARDIOGRAM COMPLETE   Collection Time: 01/22/22 11:04 AM  Result Value   Weight 6,400   Height 68   BP 151/80   Area-P 1/2 5.93   MV M vel 1.04   MV Peak grad 4.3   Narrative      ECHOCARDIOGRAM REPORT       1. Normal to low normal EF endocardium not well seen even with definity Cannot r/o lateral wall hypokinesis . Left ventricular ejection fraction, by estimation, is Not well seen%. The left ventricle has normal function. The left ventricle has no  regional wall motion abnormalities. Left ventricular diastolic parameters are consistent with Grade I diastolic dysfunction (impaired relaxation).  2. Right ventricular systolic function was not well visualized. The right ventricular size is not well visualized. There is normal pulmonary artery systolic pressure.  3. The mitral valve is abnormal. Trivial mitral valve regurgitation. No evidence of mitral stenosis.  4. The aortic valve is tricuspid. Aortic valve regurgitation is not visualized. No aortic stenosis is present.  5. 4.3 cm my measurement . Aortic  dilatation noted. There is moderate dilatation of the aortic root, measuring 43 mm.  6. The inferior vena cava is normal in size with greater than 50% respiratory variability, suggesting right atrial pressure of 3 mmHg.            CAD  - On Aspirin, statin, betablocker, on Brilinta                -  followed by cardiology            Was admitted in 2023 of NSTEMI at that time echogram was low at 50%       Cath w/ 100% occluded 1st marginal off large LCx (culprit), treated w/ PCI + DES. Only mild distal LAD and mild prox RCA disease. LVEDP elevated on cath, 24 mmgh.  Patient was diuresed    DM 2 -  Lab Results  Component  Value Date   HGBA1C 7.6 (H) 06/24/2022   on insulin,       Morbid obesity-   BMI Readings from Last 1 Encounters:  07/14/22 61.56 kg/m      OSA - noncompliant with CPAP     A. Fib -  - CHA2DS2 vas score  5        Not on anticoagulation Was isolated event     CKD stage IIIa- baseline Cr 1.6 Estimated Creatinine Clearance: 65.6 mL/min (A) (by C-G formula based on SCr of 1.57 mg/dL (H)).  Lab Results  Component Value Date   CREATININE 1.57 (H) 07/14/2022   CREATININE 1.57 (H) 06/24/2022   CREATININE 1.40 (H) 02/02/2022      Liver disease Computed MELD 3.0 unavailable. Necessary lab results were not found in the last year. Computed MELD-Na unavailable. Necessary lab results were not found in the last year.   While in ER: Clinical Course as of 07/14/22 2234  Tue Jul 14, 2022  1509 Stable SOB Low BP decreased O2 sats with EMS Recently finished UTI Treatment with NH in OP setting Finished AB x2 Still havbing malaise HX CHF/CKD Labs/XR OK and pending UA [CC]    Clinical Course User Index [CC] Glyn Ade, MD       Lab Orders         Urine Culture         Urine Culture         Brain natriuretic peptide         Comprehensive metabolic panel         Urinalysis, w/ Reflex to Culture (Infection Suspected) -Urine, Unspecified Source         CBC with Differential        CXR -  NON acute  CTabd/pelvis - ordered    Following Medications were ordered in ER: Medications  Ampicillin-Sulbactam (UNASYN) 3 g in sodium chloride 0.9 % 100 mL IVPB (3 g Intravenous New Bag/Given 07/14/22 2157)       ED Triage Vitals  Enc Vitals Group     BP 07/14/22 1300 91/79     Pulse Rate 07/14/22 1300 92     Resp 07/14/22 1300 (!) 25     Temp 07/14/22 1406 97.7 F (36.5 C)     Temp Source 07/14/22 2138 Oral     SpO2 07/14/22 1300 95 %     Weight 07/14/22 1247 (!) 381 lb 6.3 oz (173 kg)     Height 07/14/22 1247 5\' 6"  (1.676 m)     Head Circumference --      Peak Flow --  Pain Score 07/14/22 1247 0     Pain Loc --      Pain Edu? --      Excl. in GC? --   TMAX(24)@     _________________________________________ Significant initial  Findings: Abnormal Labs Reviewed  COMPREHENSIVE METABOLIC PANEL - Abnormal; Notable for the following components:      Result Value   Glucose, Bld 161 (*)    BUN 31 (*)    Creatinine, Ser 1.57 (*)    Albumin 3.3 (*)    GFR, Estimated 47 (*)    Anion gap 16 (*)    All other components within normal limits  URINALYSIS, W/ REFLEX TO CULTURE (INFECTION SUSPECTED) - Abnormal; Notable for the following components:   Color, Urine AMBER (*)    APPearance TURBID (*)    Glucose, UA >=500 (*)    Hgb urine dipstick SMALL (*)    Protein, ur 100 (*)    Nitrite POSITIVE (*)    Leukocytes,Ua LARGE (*)    All other components within normal limits  CBC WITH DIFFERENTIAL/PLATELET - Abnormal; Notable for the following components:   WBC 10.8 (*)    Neutro Abs 9.4 (*)    All other components within normal limits      _________________________ Troponin   Cardiac Panel (last 3 results) Recent Labs    07/14/22 1258 07/14/22 1648  TROPONINIHS 15 5     ECG: Ordered Personally reviewed and interpreted by me showing: HR : 94 Rhythm: Sinus or ectopic atrial rhythm RBBB and LAFB No significant change since last tracing QTC 489  BNP (last 3 results) Recent Labs    01/21/22 1658 02/02/22 1253 07/14/22 1258  BNP 49.4 99.4 74.8    ____________________ This patient meets SIRS Criteria and may be septic.    The recent clinical data is shown below. Vitals:   07/14/22 1930 07/14/22 1955 07/14/22 2000 07/14/22 2138  BP: 125/86 135/88 120/67   Pulse: (!) 101 (!) 105 (!) 106   Resp: (!) 28 (!) 22    Temp:    98.2 F (36.8 C)  TempSrc:    Oral  SpO2: 95% 95% 93%   Weight:      Height:         WBC     Component Value Date/Time   WBC 10.8 (H) 07/14/2022 1258   LYMPHSABS 0.7 07/14/2022 1258   LYMPHSABS 2.5 06/24/2022  1056   MONOABS 0.7 07/14/2022 1258   EOSABS 0.0 07/14/2022 1258   EOSABS 0.3 06/24/2022 1056   BASOSABS 0.0 07/14/2022 1258   BASOSABS 0.1 06/24/2022 1056    Procalcitonin   Ordered      UA  evidence of UTI      Urine analysis:    Component Value Date/Time   COLORURINE AMBER (A) 07/14/2022 1720   APPEARANCEUR TURBID (A) 07/14/2022 1720   LABSPEC 1.021 07/14/2022 1720   PHURINE 5.0 07/14/2022 1720   GLUCOSEU >=500 (A) 07/14/2022 1720   HGBUR SMALL (A) 07/14/2022 1720   BILIRUBINUR NEGATIVE 07/14/2022 1720   BILIRUBINUR Large 12/30/2015 1600   KETONESUR NEGATIVE 07/14/2022 1720   PROTEINUR 100 (A) 07/14/2022 1720   UROBILINOGEN 0.2 12/30/2015 1600   UROBILINOGEN 1.0 09/15/2013 1855   NITRITE POSITIVE (A) 07/14/2022 1720   LEUKOCYTESUR LARGE (A) 07/14/2022 1720     ABX started Antibiotics Given (last 72 hours)     Date/Time Action Medication Dose Rate   07/14/22 2157 New Bag/Given   Ampicillin-Sulbactam (UNASYN) 3 g in sodium  chloride 0.9 % 100 mL IVPB 3 g 200 mL/hr       No results found for the last 90 days.     ________________________________________________________________  Arterial ***Venous  Blood Gas result:  pH *** pCO2 ***; pO2 ***;     %O2 Sat ***.  ABG    Component Value Date/Time   PHART 7.452 (H) 01/21/2019 2250   PCO2ART 39.7 01/21/2019 2250   PO2ART 80.3 (L) 01/21/2019 2250   HCO3 27.3 01/21/2019 2250   TCO2 21 01/01/2013 2056   O2SAT 95.5 01/21/2019 2250       __________________________________________________________ Recent Labs  Lab 07/14/22 1258  NA 138  K 4.5  CO2 22  GLUCOSE 161*  BUN 31*  CREATININE 1.57*  CALCIUM 9.3    Cr  * stable,  Up from baseline see below Lab Results  Component Value Date   CREATININE 1.57 (H) 07/14/2022   CREATININE 1.57 (H) 06/24/2022   CREATININE 1.40 (H) 02/02/2022    Recent Labs  Lab 07/14/22 1258  AST 26  ALT 20  ALKPHOS 41  BILITOT 1.0  PROT 6.8  ALBUMIN 3.3*   Lab Results   Component Value Date   CALCIUM 9.3 07/14/2022   PHOS 9.6 (H) 02/01/2019          Plt: Lab Results  Component Value Date   PLT 164 07/14/2022         Recent Labs  Lab 07/14/22 1258  WBC 10.8*  NEUTROABS 9.4*  HGB 14.0  HCT 42.6  MCV 94.5  PLT 164    HG/HCT * stable,  Down *Up from baseline see below    Component Value Date/Time   HGB 14.0 07/14/2022 1258   HGB 14.3 06/24/2022 1056   HCT 42.6 07/14/2022 1258   HCT 45.2 06/24/2022 1056   MCV 94.5 07/14/2022 1258   MCV 95 06/24/2022 1056      No results for input(s): "LIPASE", "AMYLASE" in the last 168 hours. No results for input(s): "AMMONIA" in the last 168 hours.    .lab  _______________________________________________ Hospitalist was called for admission for *** Urinary tract infection without hematuria, site unspecified ***    The following Work up has been ordered so far:  Orders Placed This Encounter  Procedures  . Urine Culture  . Urine Culture  . DG Chest Portable 1 View  . Brain natriuretic peptide  . Comprehensive metabolic panel  . Urinalysis, w/ Reflex to Culture (Infection Suspected) -Urine, Unspecified Source  . CBC with Differential  . Vital signs  . Consult for [facility] Admission  . EKG 12-Lead     OTHER Significant initial  Findings:  labs showing:     DM  labs:  HbA1C: Recent Labs    01/22/22 0030 06/24/22 1056  HGBA1C 8.1* 7.6*       CBG (last 3)  No results for input(s): "GLUCAP" in the last 72 hours.        Cultures:    Component Value Date/Time   SDES FLUID 01/26/2019 1243   SPECREQUEST SYNOVIAL KNEE 01/26/2019 1243   CULT  01/26/2019 1243    NO GROWTH 3 DAYS Performed at [facility], [address] [place], [place] [address]    REPTSTATUS 01/29/2019 FINAL 01/26/2019 1243     Radiological Exams on Admission: DG Chest Portable 1 View  Result Date: 07/14/2022 CLINICAL DATA:  Shortness of breath. EXAM: PORTABLE CHEST 1 VIEW  COMPARISON:  01/21/2022. FINDINGS: Low lung volumes accentuate the pulmonary vasculature  and cardiomediastinal silhouette. No consolidation or pulmonary edema. No pleural effusion or pneumothorax. IMPRESSION: Low lung volumes without evidence of acute cardiopulmonary disease. Electronically Signed   By: Orvan Falconer M.D.   On: 07/14/2022 13:52   _______________________________________________________________________________________________________ Latest  Blood pressure 120/67, pulse (!) 106, temperature 98.2 F (36.8 C), temperature source Oral, resp. rate (!) 22, height 5\' 6"  (1.676 m), weight (!) 173 kg, SpO2 93 %.   Vitals  labs and radiology finding personally reviewed  Review of Systems:    Pertinent positives include: ***  Constitutional:  No weight loss, night sweats, Fevers, chills, fatigue, weight loss  HEENT:  No headaches, Difficulty swallowing,Tooth/dental problems,Sore throat,  No sneezing, itching, ear ache, nasal congestion, post nasal drip,  Cardio-vascular:  No chest pain, Orthopnea, PND, anasarca, dizziness, palpitations.no Bilateral lower extremity swelling  GI:  No heartburn, indigestion, abdominal pain, nausea, vomiting, diarrhea, change in bowel habits, loss of appetite, melena, blood in stool, hematemesis Resp:  no shortness of breath at rest. No dyspnea on exertion, No excess mucus, no productive cough, No non-productive cough, No coughing up of blood.No change in color of mucus.No wheezing. Skin:  no rash or lesions. No jaundice GU:  no dysuria, change in color of urine, no urgency or frequency. No straining to urinate.  No flank pain.  Musculoskeletal:  No joint pain or no joint swelling. No decreased range of motion. No back pain.  Psych:  No change in mood or affect. No depression or anxiety. No memory loss.  Neuro: no localizing neurological complaints, no tingling, no weakness, no double vision, no gait abnormality, no slurred speech, no  confusion  All systems reviewed and apart from HOPI all are negative _______________________________________________________________________________________________ Past Medical History:   Past Medical History:  Diagnosis Date  . ADD (attention deficit disorder)   . Alcohol abuse   . Anemia   . Angina   . Anxiety   . Arthritis   . Atrial fibrillation with RVR (HCC) 01/24/2013  . Back pain   . Blood transfusion    last 10' 14- "GI bleed"  . Cataract   . CFS (chronic fatigue syndrome)   . Chest pain   . CHF (congestive heart failure) (HCC)   . CKD (chronic kidney disease) stage 3, GFR 30-59 ml/min (HCC) 01/08/2016  . Constipation   . Depression   . Diabetes mellitus (HCC) 09/18/2011  . Diverticulosis   . DJD (degenerative joint disease)   . DM type 2, uncontrolled, 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  . Edema extremities    bilateral lower extremities-"weepy areas due to fluid retention"  . Edema of both lower extremities   . Fatty liver   . Fundic gland polyps of stomach, benign   . Headache(784.0)   . Hearing loss   . Hepatitis B   . History of alcohol abuse    last drink in 1993  . Hx of blood clots   . Hyperlipemia   . Hypertension   . Joint pain   . Myocardial infarct (HCC)   . Neuromuscular disorder (HCC)   . 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 NSTEMI (non-ST elevated myocardial infarction) (HCC) 01/22/2022  . Obesity   . Obesity, morbid (HCC) 07/30/2015  . Osteoarthritis   . Pneumonia    not at present time  . Renal insufficiency   . Shortness of breath   . Sleep apnea, obstructive    does where bipap. 06-04-14 "doesn't use much now, no mask/tubing now.  . Stomach ulcer  Past Surgical History:  Procedure Laterality Date  . ABDOMINAL SURGERY     [phone]  . BACK SURGERY     x 8 -,multiple fusions(cervical to lumbar)  . BILIARY STENT PLACEMENT  02/07/2019   Procedure: BILIARY STENT PLACEMENT;  Surgeon: Meryl Dare, MD;   Location: [facility] ENDOSCOPY;  Service: Endoscopy;;  . CHOLECYSTECTOMY N/A 02/02/2019   Procedure: attempted LAPAROSCOPIC CHOLECYSTECTOMY;  Surgeon: Abigail Miyamoto, MD;  Location: [facility] OR;  Service: General;  Laterality: N/A;  . CHOLECYSTECTOMY  02/02/2019   Procedure: Cholecystectomy;  Surgeon: Abigail Miyamoto, MD;  Location: [facility] OR;  Service: General;;  . COLONOSCOPY    . COLONOSCOPY WITH PROPOFOL N/A 06/14/2014   Procedure: COLONOSCOPY WITH PROPOFOL;  Surgeon: Iva Boop, MD;  Location: [facility] ENDOSCOPY;  Service: Endoscopy;  Laterality: N/A;  . CORONARY STENT INTERVENTION N/A 01/22/2022   Procedure: CORONARY STENT INTERVENTION;  Surgeon: Kathleene Hazel, MD;  Location: [facility] INVASIVE CV LAB;  Service: Cardiovascular;  Laterality: N/A;  . DIAGNOSTIC LAPAROSCOPY    . ERCP N/A 02/07/2019   Procedure: ENDOSCOPIC RETROGRADE CHOLANGIOPANCREATOGRAPHY (ERCP);  Surgeon: Meryl Dare, MD;  Location: [facility] ENDOSCOPY;  Service: Endoscopy;  Laterality: N/A;  . ERCP N/A 04/07/2019   Procedure: ENDOSCOPIC RETROGRADE CHOLANGIOPANCREATOGRAPHY (ERCP);  Surgeon: Iva Boop, MD;  Location: [facility] ENDOSCOPY;  Service: Endoscopy;  Laterality: N/A;  . ESOPHAGOGASTRODUODENOSCOPY N/A 01/03/2013   Procedure: ESOPHAGOGASTRODUODENOSCOPY (EGD);  Surgeon: Willis Modena, MD;  Location: [facility] ORS;  Service: Endoscopy;  Laterality: N/A;  . ESOPHAGOGASTRODUODENOSCOPY N/A 01/03/2013   Procedure: ESOPHAGOGASTRODUODENOSCOPY (EGD);  Surgeon: Willis Modena, MD;  Location: [facility] ENDOSCOPY;  Service: Endoscopy;  Laterality: N/A;  . ESOPHAGOGASTRODUODENOSCOPY N/A 01/23/2013   Procedure: ESOPHAGOGASTRODUODENOSCOPY (EGD);  Surgeon: Shirley Friar, MD;  Location: [facility] ENDOSCOPY;  Service: Endoscopy;  Laterality: N/A;  . ESOPHAGOGASTRODUODENOSCOPY Left 01/27/2013   Procedure: ESOPHAGOGASTRODUODENOSCOPY (EGD);  Surgeon: Shirley Friar, MD;  Location: [facility] OR;  Service: Endoscopy;  Laterality: Left;  . ESOPHAGOGASTRODUODENOSCOPY N/A  06/14/2014   Procedure: ESOPHAGOGASTRODUODENOSCOPY (EGD);  Surgeon: Iva Boop, MD;  Location: [facility] ENDOSCOPY;  Service: Endoscopy;  Laterality: N/A;  . ESOPHAGOSCOPY N/A 01/01/2013   Procedure: ESOPHAGOSCOPY;  Surgeon: Petra Kuba, MD;  Location: [facility] OR;  Service: Endoscopy;  Laterality: N/A;  . GASTRIC BYPASS  1977   Reversed in 1992 and revision 1994  . GASTROINTESTINAL STENT REMOVAL N/A 04/07/2019   Procedure: GASTROINTESTINAL STENT REMOVAL;  Surgeon: Iva Boop, MD;  Location: [facility] ENDOSCOPY;  Service: Endoscopy;  Laterality: N/A;  . HYDROCELE EXCISION  1996   x 2   . KNEE SURGERY Left 2004  . LEFT HEART CATH AND CORONARY ANGIOGRAPHY N/A 01/22/2022   Procedure: LEFT HEART CATH AND CORONARY ANGIOGRAPHY;  Surgeon: Kathleene Hazel, MD;  Location: [facility] INVASIVE CV LAB;  Service: Cardiovascular;  Laterality: N/A;    Social History:  Ambulatory *** independently cane, walker  wheelchair bound, bed bound     reports that he has never smoked. He has never used smokeless tobacco. He reports that he does not drink alcohol and does not use drugs.     Family History: *** Family History  Problem Relation Age of Onset  . Arthritis Mother   . Osteoporosis Mother   . Diabetes Mother   . High blood pressure Mother   . High Cholesterol Mother   . Depression Mother   . Anxiety disorder Mother   . Sleep apnea Mother   . Eating disorder Mother   . Heart disease Father   .  Skin cancer Father   . COPD Father   . Hypertension Father   . Stroke Father   . High Cholesterol Father   . High blood pressure Sister   . High blood pressure Sister   . Arthritis Sister   . Arthritis Sister   . Cancer Maternal Grandmother   . Diabetes Daughter   . Depression Son   . High blood pressure Son   . Depression Son   . Heart disease Maternal Uncle   . Heart disease Maternal Uncle   . Heart disease Paternal Uncle     ______________________________________________________________________________________________ Allergies: Allergies  Allergen Reactions  . Chlorzoxazone Other (See Comments)    Other reaction(s): Angioedema (ALLERGY/intolerance)  . Piroxicam Shortness Of Breath and Swelling    Tongue  . Latex Other (See Comments) and Rash    Rash and itching Other reaction(s): Other (See Comments) Burning Feeling  . Adhesive [Tape] Other (See Comments)    welts Unknown paper tape ok to use      Prior to Admission medications   Medication Sig Start Date End Date Taking? Authorizing Provider  acetaminophen (TYLENOL) 500 MG tablet Take 1,000 mg by mouth in the morning and at bedtime.   Yes [provider]  amLODipine (NORVASC) 10 MG tablet Take 10 mg by mouth in the morning. 09/19/18  Yes [provider]  aspirin EC 81 MG tablet Take 1 tablet (81 mg total) by mouth daily. Swallow whole. Patient taking differently: Take 81 mg by mouth in the morning and at bedtime. Swallow whole. 01/24/22  Yes Cyndi Bender, NP  atorvastatin (LIPITOR) 80 MG tablet Take 1 tablet (80 mg total) by mouth at bedtime. 06/02/22  Yes O'Neal, Ronnald Ramp, MD  carvedilol (COREG) 12.5 MG tablet Take 12.5 mg by mouth 2 (two) times daily.   Yes [provider]  cholecalciferol (VITAMIN D3) 25 MCG (1000 UNIT) tablet Take 1,000 Units by mouth daily.   Yes [provider]  dapagliflozin propanediol (FARXIGA) 10 MG TABS tablet Take 1 tablet (10 mg total) by mouth daily. 01/24/22  Yes Cyndi Bender, NP  docusate sodium (COLACE) 100 MG capsule Take 100 mg by mouth 2 (two) times daily as needed for mild constipation.   Yes [provider]  DULoxetine (CYMBALTA) 60 MG capsule Take 1 capsule (60 mg total) by mouth daily. 02/11/19 01/22/23 Yes Jae Dire, MD  ENTRESTO 24-26 MG TAKE 1 TABLET BY MOUTH TWICE DAILY 06/05/22  Yes O'Neal, Ronnald Ramp, MD  HUMALOG KWIKPEN 100 UNIT/ML KwikPen Inject 30 Units  into the skin at bedtime. 03/13/22  Yes [provider]  icosapent Ethyl (VASCEPA) 1 g capsule TAKE 2 CAPSULES(2 GRAMS) BY MOUTH TWICE DAILY 06/05/22  Yes O'Neal, Ronnald Ramp, MD  losartan (COZAAR) 50 MG tablet Take 50 mg by mouth in the morning and at bedtime.   Yes [provider]  Menthol, Topical Analgesic, (BIOFREEZE EX) Apply 1 Application topically as needed (pain.).   Yes [provider]  metFORMIN (GLUCOPHAGE) 1000 MG tablet Take 1,000 mg by mouth 2 (two) times daily.   Yes [provider]  Multiple Vitamins-Minerals (CENTRUM SILVER 50+MEN) TABS Take 1 tablet by mouth daily.   Yes [provider]  pantoprazole (PROTONIX) 40 MG tablet Take 40 mg by mouth 2 (two) times daily.   Yes [provider]  Polyethyl Glycol-Propyl Glycol (LUBRICANT EYE DROPS) 0.4-0.3 % SOLN Place 1 drop into both eyes 3 (three) times daily as needed (dry/irritated eyes.).   Yes  [provider]  potassium chloride (KLOR-CON M) 10 MEQ tablet TAKE 1 TABLET(10 MEQ) BY MOUTH TWICE DAILY Patient taking differently: Take 10 mEq by mouth 2 (two) times daily. 04/23/22  Yes O'Neal, Ronnald Ramp, MD  pregabalin (LYRICA) 200 MG capsule Take 200 mg by mouth 3 (three) times daily.   Yes [provider]  sildenafil (VIAGRA) 50 MG tablet TAKE 1 TABLET BY MOUTH AS NEEDED FOR ERECTILE DYSFUNCTION 04/29/22  Yes O'Neal, Ronnald Ramp, MD  silver sulfADIAZINE (SILVADENE) 1 % cream Apply 1 Application topically 2 (two) times daily as needed (skin irritation). Mix at home 1:1 with triamcinolone and apply 2 times a day to upper back, chest, under breast, groin areas and legs. 04/23/20  Yes [provider]  spironolactone (ALDACTONE) 25 MG tablet Take 1 tablet (25 mg total) by mouth daily. 01/24/22  Yes Cyndi Bender, NP  ticagrelor (BRILINTA) 90 MG TABS tablet Take 1 tablet (90 mg total) by mouth 2 (two) times daily. 01/24/22  Yes Cyndi Bender, NP  torsemide (DEMADEX) 10  MG tablet Take 1 tablet (10 mg total) by mouth 2 (two) times daily. Patient needs to keep upcoming appointment with provider. Patient taking differently: Take 50 mg by mouth 2 (two) times daily. Patient needs to keep upcoming appointment with provider. 07/13/22  Yes Cannon Kettle, PA-C  traZODone (DESYREL) 300 MG tablet Take 1 tablet (300 mg total) by mouth at bedtime. 02/10/19 01/22/23 Yes Jae Dire, MD  triamcinolone cream (KENALOG) 0.1 % Apply 1 Application topically 2 (two) times daily as needed (skin irritation.). Mix at home 1:1 with triamcinolone and apply 2 times a day to upper back, chest, under breast, groin areas and legs. 04/01/21  Yes [provider]  TRULICITY 0.75 MG/0.5ML SOPN Inject 0.75 mg into the skin once a week. Sunday 04/23/22  Yes [provider]  vitamin B-12 (CYANOCOBALAMIN) 500 MCG tablet Take 500 mcg by mouth daily. Unknown dosage   Yes [provider]  glucose blood (ACCU-CHEK AVIVA PLUS) test strip 1 each by Other route See admin instructions. Check blood sugar twice daily    [provider]    ___________________________________________________________________________________________________ Physical Exam:    07/14/2022    8:00 PM 07/14/2022    7:55 PM 07/14/2022    7:30 PM  Vitals with BMI  Systolic 120 135 657  Diastolic 67 88 86  Pulse 106 105 101     1. General:  in No ***Acute distress***increased work of breathing ***complaining of severe pain****agitated * Chronically ill *well *cachectic *toxic acutely ill -appearing 2. Psychological: Alert and *** Oriented 3. Head/ENT:   Moist *** Dry Mucous Membranes                          Head Non traumatic, neck supple                          Normal *** Poor Dentition 4. SKIN: normal *** decreased Skin turgor,  Skin clean Dry and intact no rash    5. Heart: Regular rate and rhythm no*** Murmur, no Rub or gallop 6. Lungs: ***Clear to auscultation bilaterally, no  wheezes or crackles   7. Abdomen: Soft, ***non-tender, Non distended *** obese ***bowel sounds present 8. Lower extremities: no clubbing, cyanosis, no ***edema 9. Neurologically Grossly intact, moving all 4 extremities equally *** strength 5 out of 5 in all 4 extremities cranial nerves II through XII  intact 10. MSK: Normal range of motion    Chart has been reviewed  ______________________________________________________________________________________________  Assessment/Plan  ***  Admitted for *** Urinary tract infection without hematuria, site unspecified ***    Present on Admission: **None**     No problem-specific Assessment & Plan notes found for this encounter.    Other plan as per orders.  DVT prophylaxis:  SCD *** Lovenox       Code Status:    Code Status: Prior FULL CODE   as per patient   I had personally discussed CODE STATUS with patient    ACP none   Family Communication:   Family not at  Bedside    Diet    Disposition Plan:       To home once workup is complete and patient is stable  ***Following barriers for discharge:                            Electrolytes corrected                               Anemia corrected                             Pain controlled with PO medications                               Afebrile, white count improving able to transition to PO antibiotics                             Will need to be able to tolerate PO                            Will likely need home health, home O2, set up                           Will need consultants to evaluate patient prior to discharge  ****EXPECT DC tomorrow                        ***Would benefit from PT/OT eval prior to DC  Ordered                   Swallow eval - SLP ordered                   Diabetes care coordinator                   Transition of care consulted                   Nutrition    consulted                  Wound care  consulted                   Palliative  care    consulted                   Behavioral health  consulted                    Consults  called: ***     Admission status:  ED Disposition     ED Disposition  Admit   Condition  --   Comment  The patient appears reasonably stabilized for admission considering the current resources, flow, and capabilities available in the ED at this time, and I doubt any other [facility] requiring further screening and/or treatment in the ED prior to admission is  present.           Obs***  ***  inpatient     I Expect 2 midnight stay secondary to severity of patient's current illness need for inpatient interventions justified by the following: ***hemodynamic instability despite optimal treatment (tachycardia *hypotension * tachypnea *hypoxia, hypercapnia) * Severe lab/radiological/exam abnormalities including:     and extensive comorbidities including: *substance abuse  *Chronic pain *DM2  * CHF * CAD  * COPD/asthma *Morbid Obesity * CKD *dementia *liver disease *history of stroke with residual deficits *  malignancy, * sickle cell disease  History of amputation Chronic anticoagulation  That are currently affecting medical management.   I expect  patient to be hospitalized for 2 midnights requiring inpatient medical care.  Patient is at high risk for adverse outcome (such as loss of life or disability) if not treated.  Indication for inpatient stay as follows:  Severe change from baseline regarding mental status Hemodynamic instability despite maximal medical therapy,  ongoing suicidal ideations,  severe pain requiring acute inpatient management,  inability to maintain oral hydration   persistent chest pain despite medical management Need for operative/procedural  intervention New or worsening hypoxia   Need for IV antibiotics, IV fluids, IV rate controling medications, IV antihypertensives, IV pain medications, IV anticoagulation, need for biPAP    Level of care   ***  tele  For 12H 24H     medical floor       progressive tele indefinitely please discontinue once patient no longer qualifies COVID-19 Labs      Shirl Ludington 07/14/2022, 10:34 PM ***  [facility]     after 2 AM please page floor coverage PA If 7AM-7PM, please contact the day team taking care of the patient using [email or web address]

## 2022-07-14 NOTE — ED Triage Notes (Signed)
Pt to the ed from home via ems with a CC of weakness and Sob the last day. Pt was hypotensive and had low O2 for ems around 90%. Pt relays chills without fever. Pt denies n/v/d, chest pain, dizziness.

## 2022-07-14 NOTE — Subjective & Objective (Signed)
Patient comes in from home via EMS complaining of weakness shortness of breath for the past day noted to be hypotensive with a low oxygen saturation in 90s.  Has been having some chills but no fever no nausea no vomiting no diarrhea no chest pain Noted leg edema.  He has history of CHF and recent UTI.  UTI was treated was Enterococcus faecalis he is not sure what medication has been taking for this. Has not been able to comply his Lasix urine or urinary complaints. History of alcohol abuse he is in atrial fibrillation

## 2022-07-14 NOTE — ED Provider Notes (Signed)
[place] EMERGENCY DEPARTMENT AT [facility] Provider Note   CSN: [id number] Arrival date & time: 07/14/22  1234     History  Chief Complaint  Patient presents with   Shortness of Breath   Weakness    Phillip Maldonado is a 72 y.o. male.   Shortness of Breath Weakness Associated symptoms: shortness of breath   Patient with shortness of breath.  Mild swelling of his legs.  History of CHF but also history UTI.  Recently had been treated.  Reviewed culture and had Enterococcus faecalis.  States he had been on 1 medicine and then switched.  Does not know what the medicines were.  States he has had some issues with taking his Lasix however.  No chest pain.  No fevers.  Reportedly had some hypotension and mild hypoxia for EMS.  May have had some chills but no fever.    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   Edema extremities    bilateral lower extremities-"weepy areas due to fluid retention"   Edema of both lower extremities    Fatty liver    Fundic gland polyps of stomach, benign    Headache(784.0)    Hearing loss    Hepatitis B    History of alcohol abuse    last drink in 1993   Hx of blood clots    Hyperlipemia    Hypertension    Joint pain    Myocardial infarct (HCC)    Neuromuscular disorder (HCC)    Neuropathy, 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 06-04-14 "doesn't use much now, no mask/tubing now.   Stomach ulcer     Home Medications Prior to Admission medications   Medication Sig Start Date End Date Taking? Authorizing Provider  acetaminophen (TYLENOL) 500 MG tablet Take 1,000 mg by mouth in the morning and at bedtime. Pt taking 2,000 mg twice daily    [provider]  amLODipine (NORVASC) 10 MG tablet Take 10 mg by mouth in the morning. 09/19/18   [provider]  ASPERCREME LIDOCAINE EX Apply 1 Application topically as needed (pain.). Patient not taking: Reported on 06/24/2022    [provider]  aspirin EC 81 MG tablet Take 1 tablet (81 mg total) by mouth daily. Swallow whole. Patient taking differently: Take 81 mg by mouth in the morning and at bedtime. Swallow whole. 01/24/22   Cyndi Bender, NP  atorvastatin (LIPITOR) 80 MG tablet Take 1 tablet (  80 mg total) by mouth at bedtime. 06/02/22   O'NealRonnald Ramp, MD  carvedilol (COREG) 12.5 MG tablet Take 12.5 mg by mouth 2 (two) times daily.    [provider]  cetirizine (ZYRTEC) 10 MG tablet Take 10 mg by mouth in the morning. Patient not taking: Reported on 06/24/2022    [provider]  cholecalciferol (VITAMIN D3) 25 MCG (1000 UNIT) tablet Take 1,000 Units by mouth daily.    [provider]  Cyanocobalamin (VITAMIN B-12 PO) Take by mouth. Unknown dosage    [provider]  dapagliflozin propanediol (FARXIGA) 10 MG TABS tablet Take 1 tablet (10 mg total) by mouth daily. 01/24/22   Cyndi Bender, NP  docusate sodium (COLACE) 100 MG capsule Take 100 mg by mouth 2 (two) times daily as needed for mild constipation.    [provider]  DULoxetine (CYMBALTA) 60 MG capsule Take 1 capsule (60 mg total) by mouth  daily. 02/11/19 01/22/23  Jae Dire, MD  ENTRESTO 24-26 MG TAKE 1 TABLET BY MOUTH TWICE DAILY 06/05/22   O'Neal, Ronnald Ramp, MD  glucose blood (ACCU-CHEK AVIVA PLUS) test strip 1 each by Other route See admin instructions. Check blood sugar twice daily    [provider]  HUMALOG KWIKPEN 100 UNIT/ML KwikPen Inject 30 Units into the skin at bedtime. 03/13/22   [provider]  icosapent Ethyl (VASCEPA) 1 g capsule TAKE 2 CAPSULES(2 GRAMS) BY MOUTH TWICE DAILY 06/05/22   O'Neal, Ronnald Ramp, MD  ketoconazole (NIZORAL) 2 % shampoo Apply 1 Application topically 2 (two) times a week. Patient not taking: Reported on 06/24/2022    [provider]  losartan (COZAAR) 50 MG tablet Take 50 mg by mouth in the morning and at bedtime.    [provider]  Menthol, Topical Analgesic, (BIOFREEZE EX) Apply 1 Application topically as needed (pain.).    [provider]  metFORMIN (GLUCOPHAGE) 1000 MG tablet Take 1,000 mg by mouth 2 (two) times daily.    [provider]  Multiple Vitamins-Minerals (CENTRUM SILVER 50+MEN) TABS Take 1 tablet by mouth daily.    [provider]  nystatin cream (MYCOSTATIN) Apply 1 Application topically at bedtime. Patient not taking: Reported on 06/24/2022    [provider]  pantoprazole (PROTONIX) 40 MG tablet Take 40 mg by mouth 2 (two) times daily.    [provider]  Polyethyl Glycol-Propyl Glycol (LUBRICANT EYE DROPS) 0.4-0.3 % SOLN Place 1 drop into both eyes 3 (three) times daily as needed (dry/irritated eyes.). Patient not taking: Reported on 06/24/2022    [provider]  potassium chloride (KLOR-CON M) 10 MEQ tablet TAKE 1 TABLET(10 MEQ) BY MOUTH TWICE DAILY 04/23/22   O'Neal, Ronnald Ramp, MD  pregabalin (LYRICA) 200 MG capsule Take 200 mg by mouth 3 (three) times daily.    [provider]  sildenafil (VIAGRA) 50 MG tablet TAKE 1 TABLET BY MOUTH AS NEEDED FOR ERECTILE  DYSFUNCTION. 04/29/22   O'NealRonnald Ramp, MD  sildenafil (VIAGRA) 50 MG tablet TAKE 1 TABLET BY MOUTH AS NEEDED FOR ERECTILE DYSFUNCTION Patient not taking: Reported on 06/24/2022 04/29/22   Sande Rives, MD  silver sulfADIAZINE (SILVADENE) 1 % cream Apply 1 Application topically 2 (two) times daily as needed (skin irritation). Mix at home 1:1 with triamcinolone and apply 2 times a day to upper back, chest, under breast, groin areas and legs. Patient not taking: Reported on 06/24/2022 04/23/20   [provider]  spironolactone (ALDACTONE) 25  MG tablet Take 1 tablet (25 mg total) by mouth daily. 01/24/22   Cyndi Bender, NP  ticagrelor (BRILINTA) 90 MG TABS tablet Take 1 tablet (90 mg total) by mouth 2 (two) times daily. 01/24/22   Cyndi Bender, NP  torsemide (DEMADEX) 10 MG tablet Take 1 tablet (10 mg total) by mouth 2 (two) times daily. Patient needs to keep upcoming appointment with provider. 07/13/22   Cannon Kettle, PA-C  traZODone (DESYREL) 300 MG tablet Take 1 tablet (300 mg total) by mouth at bedtime. 02/10/19 01/22/23  Jae Dire, MD  triamcinolone cream (KENALOG) 0.1 % Apply 1 Application topically 2 (two) times daily as needed (skin irritation.). Mix at home 1:1 with triamcinolone and apply 2 times a day to upper back, chest, under breast, groin areas and legs. Patient not taking: Reported on 06/24/2022 04/01/21   [provider]  VICTOZA 18 MG/3ML SOPN Inject 1.8 mg into the skin in the morning. Patient not taking: Reported on 06/24/2022    [provider]      Allergies    Chlorzoxazone, Piroxicam, Latex, and Adhesive [tape]    Review of Systems   Review of Systems  Respiratory:  Positive for shortness of breath.   Neurological:  Positive for weakness.    Physical Exam Updated Vital Signs BP 132/65   Pulse 93   Temp 97.7 F (36.5 C)   Resp (!) 24   Ht 5\' 6"  (1.676 m)   Wt (!) 173 kg   SpO2 97%   BMI 61.56 kg/m  Physical Exam Vitals  and nursing note reviewed.  Pulmonary:     Breath sounds: No wheezing or rhonchi.  Abdominal:     Tenderness: There is no abdominal tenderness.  Musculoskeletal:     Right lower leg: Edema present.     Left lower leg: Edema present.  Neurological:     Mental Status: He is alert.     ED Results / Procedures / Treatments   Labs (all labs ordered are listed, but only abnormal results are displayed) Labs Reviewed  COMPREHENSIVE METABOLIC PANEL - Abnormal; Notable for the following components:      Result Value   Glucose, Bld 161 (*)    BUN 31 (*)    Creatinine, Ser 1.57 (*)    Albumin 3.3 (*)    GFR, Estimated 47 (*)    Anion gap 16 (*)    All other components within normal limits  CBC WITH DIFFERENTIAL/PLATELET - Abnormal; Notable for the following components:   WBC 10.8 (*)    Neutro Abs 9.4 (*)    All other components within normal limits  BRAIN NATRIURETIC PEPTIDE  URINALYSIS, W/ REFLEX TO CULTURE (INFECTION SUSPECTED)  TROPONIN I (HIGH SENSITIVITY)  TROPONIN I (HIGH SENSITIVITY)    EKG EKG Interpretation  Date/Time:  Tuesday July 14 2022 12:48:04 EDT Ventricular Rate:  94 PR Interval:  199 QRS Duration: 153 QT Interval:  391 QTC Calculation: 489 R Axis:   -63 Text Interpretation: Sinus or ectopic atrial rhythm RBBB and LAFB No significant change since last tracing Confirmed by Benjiman Core [phone]) on 07/14/2022 12:57:00 PM  Radiology DG Chest Portable 1 View  Result Date: 07/14/2022 CLINICAL DATA:  Shortness of breath. EXAM: PORTABLE CHEST 1 VIEW COMPARISON:  01/21/2022. FINDINGS: Low lung volumes accentuate the pulmonary vasculature and cardiomediastinal silhouette. No consolidation or pulmonary edema. No pleural effusion or pneumothorax. IMPRESSION: Low lung volumes without evidence of acute cardiopulmonary disease. Electronically Signed   By: Zollie Beckers  Wiggins M.D.   On: 07/14/2022 13:52    Procedures Procedures    Medications Ordered in ED Medications  - No data to display  ED Course/ Medical Decision Making/ A&P Clinical Course as of 07/14/22 1532  Tue Jul 14, 2022  1509 Stable SOB Low BP decreased O2 sats with EMS Recently finished UTI Treatment with NH in OP setting Finished AB x2 Still havbing malaise HX CHF/CKD Labs/XR OK and pending UA [CC]    Clinical Course User Index [CC] Glyn Ade, MD                             Medical Decision Making Amount and/or Complexity of Data Reviewed Labs: ordered. Radiology: ordered.   Patient with shortness of breath and fatigue.  Recent UTI.  Has had previous UTIs but also CHF and non-STEMI.  EKG reassuring.  Troponin negative.  Urinalysis still pending.  Care turned over to Dr. Doran Durand        Final Clinical Impression(s) / ED Diagnoses Final diagnoses:  None    Rx / DC Orders ED Discharge Orders     None         Benjiman Core, MD 07/14/22 [phone]

## 2022-07-14 NOTE — ED Notes (Signed)
Pt updated on bed delay

## 2022-07-14 NOTE — Assessment & Plan Note (Signed)
Order bowel regimen °

## 2022-07-14 NOTE — H&P (Signed)
Phillip Maldonado ZOX:[id number] DOB: April 03, 1950 DOA: 07/14/2022     PCP: Amil Amen, MD   Outpatient Specialists:   CARDS:   Dr. Reatha Harps, MD  NEphrology: *  Dr. NEurology *   Dr. Pulmonary *  Dr.  Oncology * Dr. Sandria Manly* Dr.  Deboraha Sprang, [facility]) No care team member to display Urology Dr. *  Patient arrived to ER on 07/14/22 at 1234 Referred by Attending Glyn Ade, MD   Patient coming from:    home Lives With family    Chief Complaint:   Chief Complaint  Patient presents with   Shortness of Breath   Weakness    HPI: Phillip Maldonado is a 72 y.o. male with medical history significant of morbidly obese, DM2, HLD , HTN , systolic CHF, venous stasis dermatitis, CKD, OSA nolonger on CPAP, NAFLD,     Presented with  confusion, dyuria Patient comes in from home via EMS complaining of weakness shortness of breath for the past day noted to be hypotensive with a low oxygen saturation in 90s.  Has been having some chills but no fever no nausea no vomiting no diarrhea no chest pain Noted leg edema.  He has history of CHF and recent UTI.  UTI was treated was Enterococcus faecalis he is not sure what medication has been taking for this. Has not been able to comply his Lasix urine or urinary complaints. History of alcohol abuse he is in atrial fibrillation Seems he was treated with omnicef      Lab Results  Component Value Date   SARSCOV2NAA NEGATIVE 04/05/2019   SARSCOV2NAA NEGATIVE 01/20/2019   SARSCOV2NAA Not Detected 12/06/2018   SARSCOV2NAA Not Detected 11/17/2018        Regarding pertinent Chronic problems:    Hyperlipidemia -  on statins Lipitor (atorvastatin)  Lipid Panel     Component Value Date/Time   CHOL 210 (H) 06/24/2022 1056   TRIG 350 (H) 06/24/2022 1056   HDL 39 (L) 06/24/2022 1056   CHOLHDL 3.5 01/22/2022 0030   VLDL 67 (H) 01/22/2022 0030   LDLCALC 111 (H) 06/24/2022 1056   LABVLDL 60 (H) 06/24/2022 1056     HTN on norvasc,  coreg, cozaar   chronic CHF diastolic/systolic/ combined -  on entresto spironolactone Torsemide  last echo*** Recent Results (from the past 81191 hour(s))  ECHOCARDIOGRAM COMPLETE   Collection Time: 01/22/22 11:04 AM  Result Value   Weight 6,400   Height 68   BP 151/80   Area-P 1/2 5.93   MV M vel 1.04   MV Peak grad 4.3   Narrative      ECHOCARDIOGRAM REPORT       1. Normal to low normal EF endocardium not well seen even with definity Cannot r/o lateral wall hypokinesis . Left ventricular ejection fraction, by estimation, is Not well seen%. The left ventricle has normal function. The left ventricle has no  regional wall motion abnormalities. Left ventricular diastolic parameters are consistent with Grade I diastolic dysfunction (impaired relaxation).  2. Right ventricular systolic function was not well visualized. The right ventricular size is not well visualized. There is normal pulmonary artery systolic pressure.  3. The mitral valve is abnormal. Trivial mitral valve regurgitation. No evidence of mitral stenosis.  4. The aortic valve is tricuspid. Aortic valve regurgitation is not visualized. No aortic stenosis is present.  5. 4.3 cm my measurement . Aortic dilatation noted. There is moderate dilatation of the aortic root, measuring 43 mm.  6. The inferior vena cava is normal in size with greater than 50% respiratory variability, suggesting right atrial pressure of 3 mmHg.            CAD  - On Aspirin, statin, betablocker, on Brilinta                -  followed by cardiology                    DM 2 -  Lab Results  Component Value Date   HGBA1C 7.6 (H) 06/24/2022   on insulin,       Morbid obesity-   BMI Readings from Last 1 Encounters:  07/14/22 61.56 kg/m      OSA - noncompliant with CPAP    ***A. Fib -  - CHA2DS2 vas score **** CHA2DS2/VAS Stroke Risk Points  Current as of just now     5 >= 2 Points: High Risk  1 - 1.99 Points: Medium Risk  0 Points: Low Risk     No Change      Details    This score determines the patient's risk of having a stroke if the  patient has atrial fibrillation.       Points Metrics  1 Has Congestive Heart Failure:  Yes    Current as of just now  1 Has Vascular Disease:  Yes    Current as of just now  1 Has Hypertension:  Yes    Current as of just now  1 Age:  49    Current as of just now  1 Has Diabetes:  Yes    Current as of just now  0 Had Stroke:  No  Had TIA:  No  Had Thromboembolism:  No    Current as of just now  0 Male:  No    Current as of just now           current  on anticoagulation with ****Coumadin  ***Xarelto,* Eliquis,  *** Not on anticoagulation secondary to Risk of Falls, *** recurrent bleeding         -  Rate control:  Currently controlled with ***Toprolol,  *Metoprolol,* Diltiazem, *Coreg          - Rhythm control: *** amiodarone, *flecainide     CKD stage III*- baseline Cr **** Estimated Creatinine Clearance: 65.6 mL/min (A) (by C-G formula based on SCr of 1.57 mg/dL (H)).  Lab Results  Component Value Date   CREATININE 1.57 (H) 07/14/2022   CREATININE 1.57 (H) 06/24/2022   CREATININE 1.40 (H) 02/02/2022      Liver disease Computed MELD 3.0 unavailable. Necessary lab results were not found in the last year. Computed MELD-Na unavailable. Necessary lab results were not found in the last year.    ***BPH - on Flomax, Proscar          While in ER: Clinical Course as of 07/14/22 2234  Tue Jul 14, 2022  1509 Stable SOB Low BP decreased O2 sats with EMS Recently finished UTI Treatment with NH in OP setting Finished AB x2 Still havbing malaise HX CHF/CKD Labs/XR OK and pending UA [CC]    Clinical Course User Index [CC] Glyn Ade, MD       Lab Orders         Urine Culture         Urine Culture         Brain natriuretic peptide  Comprehensive metabolic panel         Urinalysis, w/ Reflex to Culture (Infection Suspected) -Urine, Unspecified Source          CBC with Differential      CT HEAD *** NON acute    CXR - ***NON acute  CTabd/pelvis - ***nonacute  CTA chest - ***nonacute, no PE, * no evidence of infiltrate  Following Medications were ordered in ER: Medications  Ampicillin-Sulbactam (UNASYN) 3 g in sodium chloride 0.9 % 100 mL IVPB (3 g Intravenous New Bag/Given 07/14/22 2157)    _______________________________________________________ ER Provider Called:     DrMarland Kitchen  They Recommend admit to medicine *** Will see in AM  ***SEEN in ER   ED Triage Vitals  Enc Vitals Group     BP 07/14/22 1300 91/79     Pulse Rate 07/14/22 1300 92     Resp 07/14/22 1300 (!) 25     Temp 07/14/22 1406 97.7 F (36.5 C)     Temp Source 07/14/22 2138 Oral     SpO2 07/14/22 1300 95 %     Weight 07/14/22 1247 (!) 381 lb 6.3 oz (173 kg)     Height 07/14/22 1247 5\' 6"  (1.676 m)     Head Circumference --      Peak Flow --      Pain Score 07/14/22 1247 0     Pain Loc --      Pain Edu? --      Excl. in GC? --   TMAX(24)@     _________________________________________ Significant initial  Findings: Abnormal Labs Reviewed  COMPREHENSIVE METABOLIC PANEL - Abnormal; Notable for the following components:      Result Value   Glucose, Bld 161 (*)    BUN 31 (*)    Creatinine, Ser 1.57 (*)    Albumin 3.3 (*)    GFR, Estimated 47 (*)    Anion gap 16 (*)    All other components within normal limits  URINALYSIS, W/ REFLEX TO CULTURE (INFECTION SUSPECTED) - Abnormal; Notable for the following components:   Color, Urine AMBER (*)    APPearance TURBID (*)    Glucose, UA >=500 (*)    Hgb urine dipstick SMALL (*)    Protein, ur 100 (*)    Nitrite POSITIVE (*)    Leukocytes,Ua LARGE (*)    All other components within normal limits  CBC WITH DIFFERENTIAL/PLATELET - Abnormal; Notable for the following components:   WBC 10.8 (*)    Neutro Abs 9.4 (*)    All other components within normal limits      _________________________ Troponin  ***ordered Cardiac Panel (last 3 results) Recent Labs    07/14/22 1258 07/14/22 1648  TROPONINIHS 15 5     ECG: Ordered Personally reviewed and interpreted by me showing: HR : *** Rhythm: *NSR, Sinus tachycardia * A.fib. W RVR, RBBB, LBBB, Paced Ischemic changes*nonspecific changes, no evidence of ischemic changes QTC*  BNP (last 3 results) Recent Labs    01/21/22 1658 02/02/22 1253 07/14/22 1258  BNP 49.4 99.4 74.8     COVID-19 Labs  No results for input(s): "DDIMER", "FERRITIN", "LDH", "CRP" in the last 72 hours.  Lab Results  Component Value Date   SARSCOV2NAA NEGATIVE 04/05/2019   SARSCOV2NAA NEGATIVE 01/20/2019   SARSCOV2NAA Not Detected 12/06/2018   SARSCOV2NAA Not Detected 11/17/2018       ____________________ This patient meets SIRS Criteria and may be septic. SIRS = Systemic Inflammatory Response Syndrome  Order a  lactic acid level if needed AND/OR Initiate the sepsis protocol with the attached order set OR Click "Treating Associated Infection or Illness" if the patient is being treated for an infection that is a known cause of these abnormalities     The recent clinical data is shown below. Vitals:   07/14/22 1930 07/14/22 1955 07/14/22 2000 07/14/22 2138  BP: 125/86 135/88 120/67   Pulse: (!) 101 (!) 105 (!) 106   Resp: (!) 28 (!) 22    Temp:    98.2 F (36.8 C)  TempSrc:    Oral  SpO2: 95% 95% 93%   Weight:      Height:            WBC     Component Value Date/Time   WBC 10.8 (H) 07/14/2022 1258   LYMPHSABS 0.7 07/14/2022 1258   LYMPHSABS 2.5 06/24/2022 1056   MONOABS 0.7 07/14/2022 1258   EOSABS 0.0 07/14/2022 1258   EOSABS 0.3 06/24/2022 1056   BASOSABS 0.0 07/14/2022 1258   BASOSABS 0.1 06/24/2022 1056        Lactic Acid, Venous    Component Value Date/Time   LATICACIDVEN 1.3 01/22/2019 0102      Lactic Acid, Venous    Component Value Date/Time   LATICACIDVEN 1.3 01/22/2019 0102    Procalcitonin ***  Ordered      UA *** no evidence of UTI  ***Pending ***not ordered   Urine analysis:    Component Value Date/Time   COLORURINE AMBER (A) 07/14/2022 1720   APPEARANCEUR TURBID (A) 07/14/2022 1720   LABSPEC 1.021 07/14/2022 1720   PHURINE 5.0 07/14/2022 1720   GLUCOSEU >=500 (A) 07/14/2022 1720   HGBUR SMALL (A) 07/14/2022 1720   BILIRUBINUR NEGATIVE 07/14/2022 1720   BILIRUBINUR Large 12/30/2015 1600   KETONESUR NEGATIVE 07/14/2022 1720   PROTEINUR 100 (A) 07/14/2022 1720   UROBILINOGEN 0.2 12/30/2015 1600   UROBILINOGEN 1.0 09/15/2013 1855   NITRITE POSITIVE (A) 07/14/2022 1720   LEUKOCYTESUR LARGE (A) 07/14/2022 1720    Results for orders placed or performed during the hospital encounter of 04/05/19  SARS CORONAVIRUS 2 (TAT 6-24 HRS) Nasopharyngeal Nasopharyngeal Swab     Status: None   Collection Time: 04/05/19  2:12 PM   Specimen: Nasopharyngeal Swab  Result Value Ref Range Status   SARS Coronavirus 2 NEGATIVE NEGATIVE Final    Comment: (NOTE) SARS-CoV-2 target nucleic acids are NOT DETECTED. The SARS-CoV-2 RNA is generally detectable in upper and lower respiratory specimens during the acute phase of infection. Negative results do not preclude SARS-CoV-2 infection, do not rule out co-infections with other pathogens, and should not be used as the sole basis for treatment or other patient management decisions. Negative results must be combined with clinical observations, patient history, and epidemiological information. The expected result is Negative. Fact Sheet for Patients: HairSlick.no Fact Sheet for Healthcare Providers: quierodirigir.com This test is not yet approved or cleared by the [place] FDA and  has been authorized for detection and/or diagnosis of SARS-CoV-2 by FDA under an Emergency Use Authorization (EUA). This EUA will remain  in effect (meaning this test can be used) for the duration of  the COVID-19 declaration under Section 56 4(b)(1) of the Act, 21 U.S.C. section 360bbb-3(b)(1), unless the authorization is terminated or revoked sooner. Performed at [facility], [address] [place], [place] [address]     ABX started Antibiotics Given (last 72 hours)     Date/Time Action Medication Dose Rate  07/14/22 2157 New Bag/Given   Ampicillin-Sulbactam (UNASYN) 3 g in sodium chloride 0.9 % 100 mL IVPB 3 g 200 mL/hr       No results found for the last 90 days.     ________________________________________________________________  Arterial ***Venous  Blood Gas result:  pH *** pCO2 ***; pO2 ***;     %O2 Sat ***.  ABG    Component Value Date/Time   PHART 7.452 (H) 01/21/2019 2250   PCO2ART 39.7 01/21/2019 2250   PO2ART 80.3 (L) 01/21/2019 2250   HCO3 27.3 01/21/2019 2250   TCO2 21 01/01/2013 2056   O2SAT 95.5 01/21/2019 2250       __________________________________________________________ Recent Labs  Lab 07/14/22 1258  NA 138  K 4.5  CO2 22  GLUCOSE 161*  BUN 31*  CREATININE 1.57*  CALCIUM 9.3    Cr  * stable,  Up from baseline see below Lab Results  Component Value Date   CREATININE 1.57 (H) 07/14/2022   CREATININE 1.57 (H) 06/24/2022   CREATININE 1.40 (H) 02/02/2022    Recent Labs  Lab 07/14/22 1258  AST 26  ALT 20  ALKPHOS 41  BILITOT 1.0  PROT 6.8  ALBUMIN 3.3*   Lab Results  Component Value Date   CALCIUM 9.3 07/14/2022   PHOS 9.6 (H) 02/01/2019          Plt: Lab Results  Component Value Date   PLT 164 07/14/2022         Recent Labs  Lab 07/14/22 1258  WBC 10.8*  NEUTROABS 9.4*  HGB 14.0  HCT 42.6  MCV 94.5  PLT 164    HG/HCT * stable,  Down *Up from baseline see below    Component Value Date/Time   HGB 14.0 07/14/2022 1258   HGB 14.3 06/24/2022 1056   HCT 42.6 07/14/2022 1258   HCT 45.2 06/24/2022 1056   MCV 94.5 07/14/2022 1258   MCV 95 06/24/2022 1056      No results for input(s):  "LIPASE", "AMYLASE" in the last 168 hours. No results for input(s): "AMMONIA" in the last 168 hours.    .lab  _______________________________________________ Hospitalist was called for admission for *** Urinary tract infection without hematuria, site unspecified ***    The following Work up has been ordered so far:  Orders Placed This Encounter  Procedures   Urine Culture   Urine Culture   DG Chest Portable 1 View   Brain natriuretic peptide   Comprehensive metabolic panel   Urinalysis, w/ Reflex to Culture (Infection Suspected) -Urine, Unspecified Source   CBC with Differential   Vital signs   Consult for [facility] Admission   EKG 12-Lead     OTHER Significant initial  Findings:  labs showing:     DM  labs:  HbA1C: Recent Labs    01/22/22 0030 06/24/22 1056  HGBA1C 8.1* 7.6*       CBG (last 3)  No results for input(s): "GLUCAP" in the last 72 hours.        Cultures:    Component Value Date/Time   SDES FLUID 01/26/2019 1243   SPECREQUEST SYNOVIAL KNEE 01/26/2019 1243   CULT  01/26/2019 1243    NO GROWTH 3 DAYS Performed at [facility], [address], [place], [place] [address]    REPTSTATUS 01/29/2019 FINAL 01/26/2019 1243     Radiological Exams on Admission: DG Chest Portable 1 View  Result Date: 07/14/2022 CLINICAL DATA:  Shortness of breath. EXAM: PORTABLE CHEST 1 VIEW  COMPARISON:  01/21/2022. FINDINGS: Low lung volumes accentuate the pulmonary vasculature and cardiomediastinal silhouette. No consolidation or pulmonary edema. No pleural effusion or pneumothorax. IMPRESSION: Low lung volumes without evidence of acute cardiopulmonary disease. Electronically Signed   By: Orvan Falconer M.D.   On: 07/14/2022 13:52   _______________________________________________________________________________________________________ Latest  Blood pressure 120/67, pulse (!) 106, temperature 98.2 F (36.8 C), temperature source Oral, resp. rate (!) 22,  height 5\' 6"  (1.676 m), weight (!) 173 kg, SpO2 93 %.   Vitals  labs and radiology finding personally reviewed  Review of Systems:    Pertinent positives include: ***  Constitutional:  No weight loss, night sweats, Fevers, chills, fatigue, weight loss  HEENT:  No headaches, Difficulty swallowing,Tooth/dental problems,Sore throat,  No sneezing, itching, ear ache, nasal congestion, post nasal drip,  Cardio-vascular:  No chest pain, Orthopnea, PND, anasarca, dizziness, palpitations.no Bilateral lower extremity swelling  GI:  No heartburn, indigestion, abdominal pain, nausea, vomiting, diarrhea, change in bowel habits, loss of appetite, melena, blood in stool, hematemesis Resp:  no shortness of breath at rest. No dyspnea on exertion, No excess mucus, no productive cough, No non-productive cough, No coughing up of blood.No change in color of mucus.No wheezing. Skin:  no rash or lesions. No jaundice GU:  no dysuria, change in color of urine, no urgency or frequency. No straining to urinate.  No flank pain.  Musculoskeletal:  No joint pain or no joint swelling. No decreased range of motion. No back pain.  Psych:  No change in mood or affect. No depression or anxiety. No memory loss.  Neuro: no localizing neurological complaints, no tingling, no weakness, no double vision, no gait abnormality, no slurred speech, no confusion  All systems reviewed and apart from HOPI all are negative _______________________________________________________________________________________________ Past Medical History:   Past Medical History:  Diagnosis Date   ADD (attention deficit disorder)    Alcohol abuse    Anemia    Angina    Anxiety    Arthritis    Atrial fibrillation with RVR (HCC) 01/24/2013   Back pain    Blood transfusion    last 10' 14- "GI bleed"   Cataract    CFS (chronic fatigue syndrome)    Chest pain    CHF (congestive heart failure) (HCC)    CKD (chronic kidney disease) stage 3,  GFR 30-59 ml/min (HCC) 01/08/2016   Constipation    Depression    Diabetes mellitus (HCC) 09/18/2011   Diverticulosis    DJD (degenerative joint disease)    DM type 2, uncontrolled, 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   Edema extremities    bilateral lower extremities-"weepy areas due to fluid retention"   Edema of both lower extremities    Fatty liver    Fundic gland polyps of stomach, benign    Headache(784.0)    Hearing loss    Hepatitis B    History of alcohol abuse    last drink in 1993   Hx of blood clots    Hyperlipemia    Hypertension    Joint pain    Myocardial infarct (HCC)    Neuromuscular disorder (HCC)    Neuropathy, 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  06-04-14 "doesn't use much now, no mask/tubing now.   Stomach ulcer       Past Surgical History:  Procedure Laterality Date   ABDOMINAL SURGERY     [phone]   BACK SURGERY     x 8 -,multiple fusions(cervical to lumbar)   BILIARY STENT PLACEMENT  02/07/2019   Procedure: BILIARY STENT PLACEMENT;  Surgeon: Meryl Dare, MD;  Location: [facility] ENDOSCOPY;  Service: Endoscopy;;   CHOLECYSTECTOMY N/A 02/02/2019   Procedure: attempted LAPAROSCOPIC CHOLECYSTECTOMY;  Surgeon: Abigail Miyamoto, MD;  Location: [facility] OR;  Service: General;  Laterality: N/A;   CHOLECYSTECTOMY  02/02/2019   Procedure: Cholecystectomy;  Surgeon: Abigail Miyamoto, MD;  Location: [facility] OR;  Service: General;;   COLONOSCOPY     COLONOSCOPY WITH PROPOFOL N/A 06/14/2014   Procedure: COLONOSCOPY WITH PROPOFOL;  Surgeon: Iva Boop, MD;  Location: [facility] ENDOSCOPY;  Service: Endoscopy;  Laterality: N/A;   CORONARY STENT INTERVENTION N/A 01/22/2022   Procedure: CORONARY STENT INTERVENTION;   Surgeon: Kathleene Hazel, MD;  Location: [facility] INVASIVE CV LAB;  Service: Cardiovascular;  Laterality: N/A;   DIAGNOSTIC LAPAROSCOPY     ERCP N/A 02/07/2019   Procedure: ENDOSCOPIC RETROGRADE CHOLANGIOPANCREATOGRAPHY (ERCP);  Surgeon: Meryl Dare, MD;  Location: [facility] ENDOSCOPY;  Service: Endoscopy;  Laterality: N/A;   ERCP N/A 04/07/2019   Procedure: ENDOSCOPIC RETROGRADE CHOLANGIOPANCREATOGRAPHY (ERCP);  Surgeon: Iva Boop, MD;  Location: [facility] ENDOSCOPY;  Service: Endoscopy;  Laterality: N/A;   ESOPHAGOGASTRODUODENOSCOPY N/A 01/03/2013   Procedure: ESOPHAGOGASTRODUODENOSCOPY (EGD);  Surgeon: Willis Modena, MD;  Location: [facility] ORS;  Service: Endoscopy;  Laterality: N/A;   ESOPHAGOGASTRODUODENOSCOPY N/A 01/03/2013   Procedure: ESOPHAGOGASTRODUODENOSCOPY (EGD);  Surgeon: Willis Modena, MD;  Location: [facility] ENDOSCOPY;  Service: Endoscopy;  Laterality: N/A;   ESOPHAGOGASTRODUODENOSCOPY N/A 01/23/2013   Procedure: ESOPHAGOGASTRODUODENOSCOPY (EGD);  Surgeon: Shirley Friar, MD;  Location: [facility] ENDOSCOPY;  Service: Endoscopy;  Laterality: N/A;   ESOPHAGOGASTRODUODENOSCOPY Left 01/27/2013   Procedure: ESOPHAGOGASTRODUODENOSCOPY (EGD);  Surgeon: Shirley Friar, MD;  Location: [facility] OR;  Service: Endoscopy;  Laterality: Left;   ESOPHAGOGASTRODUODENOSCOPY N/A 06/14/2014   Procedure: ESOPHAGOGASTRODUODENOSCOPY (EGD);  Surgeon: Iva Boop, MD;  Location: [facility] ENDOSCOPY;  Service: Endoscopy;  Laterality: N/A;   ESOPHAGOSCOPY N/A 01/01/2013   Procedure: ESOPHAGOSCOPY;  Surgeon: Petra Kuba, MD;  Location: [facility] OR;  Service: Endoscopy;  Laterality: N/A;   GASTRIC BYPASS  1977   Reversed in 1992 and revision 1994   GASTROINTESTINAL STENT REMOVAL N/A 04/07/2019   Procedure: GASTROINTESTINAL STENT REMOVAL;  Surgeon: Iva Boop, MD;  Location: [facility] ENDOSCOPY;  Service: Endoscopy;  Laterality: N/A;   HYDROCELE EXCISION  1996   x 2    KNEE SURGERY Left 2004   LEFT HEART CATH AND CORONARY  ANGIOGRAPHY N/A 01/22/2022   Procedure: LEFT HEART CATH AND CORONARY ANGIOGRAPHY;  Surgeon: Kathleene Hazel, MD;  Location: [facility] INVASIVE CV LAB;  Service: Cardiovascular;  Laterality: N/A;    Social History:  Ambulatory *** independently cane, walker  wheelchair bound, bed bound     reports that he has never smoked. He has never used smokeless tobacco. He reports that he does not drink alcohol and does not use drugs.     Family History: *** Family History  Problem Relation Age of Onset   Arthritis Mother    Osteoporosis Mother    Diabetes Mother    High blood pressure Mother    High Cholesterol Mother    Depression Mother    Anxiety disorder Mother  Sleep apnea Mother    Eating disorder Mother    Heart disease Father    Skin cancer Father    COPD Father    Hypertension Father    Stroke Father    High Cholesterol Father    High blood pressure Sister    High blood pressure Sister    Arthritis Sister    Arthritis Sister    Cancer Maternal Grandmother    Diabetes Daughter    Depression Son    High blood pressure Son    Depression Son    Heart disease Maternal Uncle    Heart disease Maternal Uncle    Heart disease Paternal Uncle    ______________________________________________________________________________________________ Allergies: Allergies  Allergen Reactions   Chlorzoxazone Other (See Comments)    Other reaction(s): Angioedema (ALLERGY/intolerance)   Piroxicam Shortness Of Breath and Swelling    Tongue   Latex Other (See Comments) and Rash    Rash and itching Other reaction(s): Other (See Comments) Burning Feeling   Adhesive [Tape] Other (See Comments)    welts Unknown paper tape ok to use      Prior to Admission medications   Medication Sig Start Date End Date Taking? Authorizing Provider  acetaminophen (TYLENOL) 500 MG tablet Take 1,000 mg by mouth in the morning and at bedtime.   Yes [provider]  amLODipine (NORVASC) 10 MG  tablet Take 10 mg by mouth in the morning. 09/19/18  Yes [provider]  aspirin EC 81 MG tablet Take 1 tablet (81 mg total) by mouth daily. Swallow whole. Patient taking differently: Take 81 mg by mouth in the morning and at bedtime. Swallow whole. 01/24/22  Yes Cyndi Bender, NP  atorvastatin (LIPITOR) 80 MG tablet Take 1 tablet (80 mg total) by mouth at bedtime. 06/02/22  Yes O'Neal, Ronnald Ramp, MD  carvedilol (COREG) 12.5 MG tablet Take 12.5 mg by mouth 2 (two) times daily.   Yes [provider]  cholecalciferol (VITAMIN D3) 25 MCG (1000 UNIT) tablet Take 1,000 Units by mouth daily.   Yes [provider]  dapagliflozin propanediol (FARXIGA) 10 MG TABS tablet Take 1 tablet (10 mg total) by mouth daily. 01/24/22  Yes Cyndi Bender, NP  docusate sodium (COLACE) 100 MG capsule Take 100 mg by mouth 2 (two) times daily as needed for mild constipation.   Yes [provider]  DULoxetine (CYMBALTA) 60 MG capsule Take 1 capsule (60 mg total) by mouth daily. 02/11/19 01/22/23 Yes Jae Dire, MD  ENTRESTO 24-26 MG TAKE 1 TABLET BY MOUTH TWICE DAILY 06/05/22  Yes O'Neal, Ronnald Ramp, MD  HUMALOG KWIKPEN 100 UNIT/ML KwikPen Inject 30 Units into the skin at bedtime. 03/13/22  Yes [provider]  icosapent Ethyl (VASCEPA) 1 g capsule TAKE 2 CAPSULES(2 GRAMS) BY MOUTH TWICE DAILY 06/05/22  Yes O'Neal, Ronnald Ramp, MD  losartan (COZAAR) 50 MG tablet Take 50 mg by mouth in the morning and at bedtime.   Yes [provider]  Menthol, Topical Analgesic, (BIOFREEZE EX) Apply 1 Application topically as needed (pain.).   Yes [provider]  metFORMIN (GLUCOPHAGE) 1000 MG tablet Take 1,000 mg by mouth 2 (two) times daily.   Yes [provider]  Multiple Vitamins-Minerals (CENTRUM SILVER 50+MEN) TABS Take 1 tablet by mouth daily.   Yes [provider]  pantoprazole (PROTONIX) 40 MG tablet Take 40 mg by mouth 2 (two) times daily.   Yes  [provider]  Polyethyl Glycol-Propyl Glycol (LUBRICANT EYE DROPS) 0.4-0.3 %  SOLN Place 1 drop into both eyes 3 (three) times daily as needed (dry/irritated eyes.).   Yes [provider]  potassium chloride (KLOR-CON M) 10 MEQ tablet TAKE 1 TABLET(10 MEQ) BY MOUTH TWICE DAILY Patient taking differently: Take 10 mEq by mouth 2 (two) times daily. 04/23/22  Yes O'Neal, Ronnald Ramp, MD  pregabalin (LYRICA) 200 MG capsule Take 200 mg by mouth 3 (three) times daily.   Yes [provider]  sildenafil (VIAGRA) 50 MG tablet TAKE 1 TABLET BY MOUTH AS NEEDED FOR ERECTILE DYSFUNCTION 04/29/22  Yes O'Neal, Ronnald Ramp, MD  silver sulfADIAZINE (SILVADENE) 1 % cream Apply 1 Application topically 2 (two) times daily as needed (skin irritation). Mix at home 1:1 with triamcinolone and apply 2 times a day to upper back, chest, under breast, groin areas and legs. 04/23/20  Yes [provider]  spironolactone (ALDACTONE) 25 MG tablet Take 1 tablet (25 mg total) by mouth daily. 01/24/22  Yes Cyndi Bender, NP  ticagrelor (BRILINTA) 90 MG TABS tablet Take 1 tablet (90 mg total) by mouth 2 (two) times daily. 01/24/22  Yes Cyndi Bender, NP  torsemide (DEMADEX) 10 MG tablet Take 1 tablet (10 mg total) by mouth 2 (two) times daily. Patient needs to keep upcoming appointment with provider. Patient taking differently: Take 50 mg by mouth 2 (two) times daily. Patient needs to keep upcoming appointment with provider. 07/13/22  Yes Cannon Kettle, PA-C  traZODone (DESYREL) 300 MG tablet Take 1 tablet (300 mg total) by mouth at bedtime. 02/10/19 01/22/23 Yes Jae Dire, MD  triamcinolone cream (KENALOG) 0.1 % Apply 1 Application topically 2 (two) times daily as needed (skin irritation.). Mix at home 1:1 with triamcinolone and apply 2 times a day to upper back, chest, under breast, groin areas and legs. 04/01/21  Yes [provider]  TRULICITY 0.75 MG/0.5ML SOPN Inject 0.75 mg into the  skin once a week. Sunday 04/23/22  Yes [provider]  vitamin B-12 (CYANOCOBALAMIN) 500 MCG tablet Take 500 mcg by mouth daily. Unknown dosage   Yes [provider]  glucose blood (ACCU-CHEK AVIVA PLUS) test strip 1 each by Other route See admin instructions. Check blood sugar twice daily    [provider]    ___________________________________________________________________________________________________ Physical Exam:    07/14/2022    8:00 PM 07/14/2022    7:55 PM 07/14/2022    7:30 PM  Vitals with BMI  Systolic 120 135 161  Diastolic 67 88 86  Pulse 106 105 101     1. General:  in No ***Acute distress***increased work of breathing ***complaining of severe pain****agitated * Chronically ill *well *cachectic *toxic acutely ill -appearing 2. Psychological: Alert and *** Oriented 3. Head/ENT:   Moist *** Dry Mucous Membranes                          Head Non traumatic, neck supple                          Normal *** Poor Dentition 4. SKIN: normal *** decreased Skin turgor,  Skin clean Dry and intact no rash    5. Heart: Regular rate and rhythm no*** Murmur, no Rub or gallop 6. Lungs: ***Clear to auscultation bilaterally, no wheezes or crackles   7. Abdomen: Soft, ***non-tender, Non distended *** obese ***bowel sounds present 8. Lower extremities: no clubbing, cyanosis, no ***edema 9. Neurologically Grossly intact, moving all 4  extremities equally *** strength 5 out of 5 in all 4 extremities cranial nerves II through XII intact 10. MSK: Normal range of motion    Chart has been reviewed  ______________________________________________________________________________________________  Assessment/Plan  ***  Admitted for *** Urinary tract infection without hematuria, site unspecified ***    Present on Admission: **None**     No problem-specific Assessment & Plan notes found for this encounter.    Other plan as per orders.  DVT prophylaxis:   SCD *** Lovenox       Code Status:    Code Status: Prior FULL CODE   as per patient   I had personally discussed CODE STATUS with patient    ACP none   Family Communication:   Family not at  Bedside    Diet    Disposition Plan:       To home once workup is complete and patient is stable  ***Following barriers for discharge:                            Electrolytes corrected                               Anemia corrected                             Pain controlled with PO medications                               Afebrile, white count improving able to transition to PO antibiotics                             Will need to be able to tolerate PO                            Will likely need home health, home O2, set up                           Will need consultants to evaluate patient prior to discharge  ****EXPECT DC tomorrow                        ***Would benefit from PT/OT eval prior to DC  Ordered                   Swallow eval - SLP ordered                   Diabetes care coordinator                   Transition of care consulted                   Nutrition    consulted                  Wound care  consulted                   Palliative care    consulted                   Behavioral health  consulted  Consults called: ***     Admission status:  ED Disposition     ED Disposition  Admit   Condition  --   Comment  The patient appears reasonably stabilized for admission considering the current resources, flow, and capabilities available in the ED at this time, and I doubt any other [facility] requiring further screening and/or treatment in the ED prior to admission is  present.           Obs***  ***  inpatient     I Expect 2 midnight stay secondary to severity of patient's current illness need for inpatient interventions justified by the following: ***hemodynamic instability despite optimal treatment (tachycardia *hypotension * tachypnea  *hypoxia, hypercapnia) * Severe lab/radiological/exam abnormalities including:     and extensive comorbidities including: *substance abuse  *Chronic pain *DM2  * CHF * CAD  * COPD/asthma *Morbid Obesity * CKD *dementia *liver disease *history of stroke with residual deficits *  malignancy, * sickle cell disease  History of amputation Chronic anticoagulation  That are currently affecting medical management.   I expect  patient to be hospitalized for 2 midnights requiring inpatient medical care.  Patient is at high risk for adverse outcome (such as loss of life or disability) if not treated.  Indication for inpatient stay as follows:  Severe change from baseline regarding mental status Hemodynamic instability despite maximal medical therapy,  ongoing suicidal ideations,  severe pain requiring acute inpatient management,  inability to maintain oral hydration   persistent chest pain despite medical management Need for operative/procedural  intervention New or worsening hypoxia   Need for IV antibiotics, IV fluids, IV rate controling medications, IV antihypertensives, IV pain medications, IV anticoagulation, need for biPAP    Level of care   *** tele  For 12H 24H     medical floor       progressive tele indefinitely please discontinue once patient no longer qualifies COVID-19 Labs      Spurgeon Gancarz 07/14/2022, 10:34 PM ***  [facility]     after 2 AM please page floor coverage PA If 7AM-7PM, please contact the day team taking care of the patient using [email or web address]

## 2022-07-14 NOTE — Discharge Instructions (Addendum)

## 2022-07-14 NOTE — ED Notes (Signed)
ED TO INPATIENT HANDOFF REPORT  ED Nurse Name and Phone #: [phone] Ece [place]  S Name/Age/Gender Phillip Maldonado 72 y.o. male Room/Bed: 022C/022C  Code Status   Code Status: Prior  Home/SNF/Other Home Patient oriented to: self, place, time, and situation Is this baseline? Yes   Triage Complete: Triage complete  Chief Complaint Sepsis [facility]) [A41.9]  Triage Note Pt to the ed from home via ems with a CC of weakness and Sob the last day. Pt was hypotensive and had low O2 for ems around 90%. Pt relays chills without fever. Pt denies n/v/d, chest pain, dizziness.    Allergies Allergies  Allergen Reactions   Chlorzoxazone Other (See Comments)    Other reaction(s): Angioedema (ALLERGY/intolerance)   Piroxicam Shortness Of Breath and Swelling    Tongue   Latex Other (See Comments) and Rash    Rash and itching Other reaction(s): Other (See Comments) Burning Feeling   Adhesive [Tape] Other (See Comments)    welts Unknown paper tape ok to use     Level of Care/Admitting Diagnosis ED Disposition     ED Disposition  Admit   Condition  --   Comment  Hospital Area: [facility] [[id number]]  Level of Care: Progressive [102]  Admit to Progressive based on following criteria: MULTISYSTEM THREATS such as stable sepsis, metabolic/electrolyte imbalance with or without encephalopathy that is responding to early treatment.  May admit patient to [facility] or [facility] if equivalent level of care is available:: No  Covid Evaluation: Asymptomatic - no recent exposure (last 10 days) testing not required  Diagnosis: Sepsis [facility]) [[id number]]  Admitting Physician: Therisa Doyne [[id number]]  Attending Physician: Therisa Doyne [[id number]]  Certification:: I certify this patient will need inpatient services for at least 2 midnights  Estimated Length of Stay: 2          B Medical/Surgery History Past Medical History:  Diagnosis Date   ADD (attention deficit  disorder)    Alcohol abuse    Anemia    Angina    Anxiety    Arthritis    Atrial fibrillation with RVR (HCC) 01/24/2013   Back pain    Blood transfusion    last 10' 14- "GI bleed"   Cataract    CFS (chronic fatigue syndrome)    Chest pain    CHF (congestive heart failure) (HCC)    CKD (chronic kidney disease) stage 3, GFR 30-59 ml/min (HCC) 01/08/2016   Constipation    Depression    Diabetes mellitus (HCC) 09/18/2011   Diverticulosis    DJD (degenerative joint disease)    DM type 2, uncontrolled, 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   Edema extremities    bilateral lower extremities-"weepy areas due to fluid retention"   Edema of both lower extremities    Fatty liver    Fundic gland polyps of stomach, benign    Headache(784.0)    Hearing loss    Hepatitis B    History of alcohol abuse    last drink in 1993   Hx of blood clots    Hyperlipemia    Hypertension    Joint pain    Myocardial infarct (HCC)    Neuromuscular disorder (HCC)    Neuropathy, 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 06-04-14 "doesn't use much now, no mask/tubing now.   Stomach ulcer    Past Surgical History:  Procedure Laterality Date   ABDOMINAL SURGERY     [id number]   BACK SURGERY     x 8 -,multiple fusions(cervical to lumbar)   BILIARY STENT PLACEMENT  02/07/2019   Procedure: BILIARY STENT PLACEMENT;  Surgeon: Meryl Dare, MD;  Location: [facility] ENDOSCOPY;  Service: Endoscopy;;   CHOLECYSTECTOMY N/A 02/02/2019   Procedure: attempted LAPAROSCOPIC CHOLECYSTECTOMY;  Surgeon: Abigail Miyamoto, MD;  Location[facility] OR;  Service: General;  Laterality: N/A;   CHOLECYSTECTOMY  02/02/2019   Procedure: Cholecystectomy;  Surgeon: Abigail Miyamoto, MD;   Location: [facility] OR;  Service: General;;   COLONOSCOPY     COLONOSCOPY WITH PROPOFOL N/A 06/14/2014   Procedure: COLONOSCOPY WITH PROPOFOL;  Surgeon: Iva Boop, MD;  Location: [facility] ENDOSCOPY;  Service: Endoscopy;  Laterality: N/A;   CORONARY STENT INTERVENTION N/A 01/22/2022   Procedure: CORONARY STENT INTERVENTION;  Surgeon: Kathleene Hazel, MD;  Location: [facility] INVASIVE CV LAB;  Service: Cardiovascular;  Laterality: N/A;   DIAGNOSTIC LAPAROSCOPY     ERCP N/A 02/07/2019   Procedure: ENDOSCOPIC RETROGRADE CHOLANGIOPANCREATOGRAPHY (ERCP);  Surgeon: Meryl Dare, MD;  Location: [facility] ENDOSCOPY;  Service: Endoscopy;  Laterality: N/A;   ERCP N/A 04/07/2019   Procedure: ENDOSCOPIC RETROGRADE CHOLANGIOPANCREATOGRAPHY (ERCP);  Surgeon: Iva Boop, MD;  Location: [facility] ENDOSCOPY;  Service: Endoscopy;  Laterality: N/A;   ESOPHAGOGASTRODUODENOSCOPY N/A 01/03/2013   Procedure: ESOPHAGOGASTRODUODENOSCOPY (EGD);  Surgeon: Willis Modena, MD;  Location: [facility] ORS;  Service: Endoscopy;  Laterality: N/A;   ESOPHAGOGASTRODUODENOSCOPY N/A 01/03/2013   Procedure: ESOPHAGOGASTRODUODENOSCOPY (EGD);  Surgeon: Willis Modena, MD;  Location: [facility] ENDOSCOPY;  Service: Endoscopy;  Laterality: N/A;   ESOPHAGOGASTRODUODENOSCOPY N/A 01/23/2013   Procedure: ESOPHAGOGASTRODUODENOSCOPY (EGD);  Surgeon: Shirley Friar, MD;  Location: [facility] ENDOSCOPY;  Service: Endoscopy;  Laterality: N/A;   ESOPHAGOGASTRODUODENOSCOPY Left 01/27/2013   Procedure: ESOPHAGOGASTRODUODENOSCOPY (EGD);  Surgeon: Shirley Friar, MD;  Location: [facility] OR;  Service: Endoscopy;  Laterality: Left;   ESOPHAGOGASTRODUODENOSCOPY N/A 06/14/2014   Procedure: ESOPHAGOGASTRODUODENOSCOPY (EGD);  Surgeon: Iva Boop, MD;  Location: [facility] ENDOSCOPY;  Service: Endoscopy;  Laterality: N/A;   ESOPHAGOSCOPY N/A 01/01/2013   Procedure: ESOPHAGOSCOPY;  Surgeon: Petra Kuba, MD;  Location: [facility] OR;  Service: Endoscopy;  Laterality: N/A;   GASTRIC BYPASS  1977    Reversed in 1992 and revision 1994   GASTROINTESTINAL STENT REMOVAL N/A 04/07/2019   Procedure: GASTROINTESTINAL STENT REMOVAL;  Surgeon: Iva Boop, MD;  Location: [facility] ENDOSCOPY;  Service: Endoscopy;  Laterality: N/A;   HYDROCELE EXCISION  1996   x 2    KNEE SURGERY Left 2004   LEFT HEART CATH AND CORONARY ANGIOGRAPHY N/A 01/22/2022   Procedure: LEFT HEART CATH AND CORONARY ANGIOGRAPHY;  Surgeon: Kathleene Hazel, MD;  Location: [facility] INVASIVE CV LAB;  Service: Cardiovascular;  Laterality: N/A;     A IV Location/Drains/Wounds Patient Lines/Drains/Airways Status     Active Line/Drains/Airways     Name Placement date Placement time Site Days   Peripheral IV 07/14/22 20 G Right Antecubital 07/14/22  1700  Antecubital  less than 1   Closed System Drain 1 Lateral;Right Abdomen Bulb (JP) 19 Fr. 02/02/19  1038  Abdomen  1258   GI Stent 8.5 Fr. 02/07/19  1250  --  1253   Incision - 5 Ports Abdomen 1: Umbilicus 2: Upper;Medial 3: Upper;Right  4: Right;Lateral 5: Right;Upper;Medial 02/02/19  0920  -- 1258   Pressure Injury 02/07/19 Buttocks Left Stage I -  Intact skin with non-blanchable redness of a localized area usually over a bony prominence. 02/07/19  0700  -- 1253            Intake/Output Last 24 hours  Intake/Output Summary (Last 24 hours) at 07/14/2022 2346 Last data filed at 07/14/2022 2240 Gross per 24 hour  Intake 100 ml  Output --  Net 100 ml    Labs/Imaging Results for orders placed or performed during the hospital encounter of 07/14/22 (from the past 48 hour(s))  Troponin I (High Sensitivity)     Status: None   Collection Time: 07/14/22 12:58 PM  Result Value Ref Range   Troponin I (High Sensitivity) 15 <18 ng/L    Comment: (NOTE) Elevated high sensitivity troponin I (hsTnI) values and significant  changes across serial measurements may suggest ACS but many other  chronic and acute conditions are known to elevate hsTnI results.  Refer to the "Links" section  for chest pain algorithms and additional  guidance. Performed at [facility], [address] [place], [place] [address]   Brain natriuretic peptide     Status: None   Collection Time: 07/14/22 12:58 PM  Result Value Ref Range   B Natriuretic Peptide 74.8 0.0 - 100.0 pg/mL    Comment: Performed at [facility], [address] [place], [place] [address]  Comprehensive metabolic panel     Status: Abnormal   Collection Time: 07/14/22 12:58 PM  Result Value Ref Range   Sodium 138 135 - 145 mmol/L   Potassium 4.5 3.5 - 5.1 mmol/L   Chloride 100 98 - 111 mmol/L   CO2 22 22 - 32 mmol/L   Glucose, Bld 161 (H) 70 - 99 mg/dL    Comment: Glucose reference range applies only to samples taken after fasting for at least 8 hours.   BUN 31 (H) 8 - 23 mg/dL   Creatinine, Ser 5.62 (H) 0.61 - 1.24 mg/dL   Calcium 9.3 8.9 - 13.0 mg/dL   Total Protein 6.8 6.5 - 8.1 g/dL   Albumin 3.3 (L) 3.5 - 5.0 g/dL   AST 26 15 - 41 U/L   ALT 20 0 - 44 U/L   Alkaline Phosphatase 41 38 - 126 U/L   Total Bilirubin 1.0 0.3 - 1.2 mg/dL   GFR, Estimated 47 (L) >60 mL/min    Comment: (NOTE) Calculated using the CKD-EPI Creatinine Equation (2021)    Anion gap 16 (H) 5 - 15    Comment: Performed at [facility], [address], [place], [place] [address]  CBC with Differential     Status: Abnormal   Collection Time: 07/14/22 12:58 PM  Result Value Ref Range   WBC 10.8 (H) 4.0 - 10.5 K/uL   RBC 4.51 4.22 - 5.81 MIL/uL   Hemoglobin 14.0 13.0 - 17.0 g/dL   HCT 46.9 62.9 - 52.8 %   MCV 94.5 80.0 - 100.0 fL   MCH 31.0 26.0 - 34.0 pg   MCHC 32.9 30.0 - 36.0 g/dL   RDW 41.3 24.4 - 01.0 %   Platelets 164 150 - 400 K/uL   nRBC 0.0 0.0 - 0.2 %   Neutrophils Relative % 88 %   Neutro Abs 9.4 (H) 1.7 - 7.7 K/uL   Lymphocytes Relative 6 %   Lymphs Abs 0.7 0.7 - 4.0 K/uL   Monocytes  Relative 6 %   Monocytes Absolute 0.7 0.1 - 1.0 K/uL   Eosinophils Relative 0 %   Eosinophils Absolute 0.0 0.0 - 0.5  K/uL   Basophils Relative 0 %   Basophils Absolute 0.0 0.0 - 0.1 K/uL   Immature Granulocytes 0 %   Abs Immature Granulocytes 0.04 0.00 - 0.07 K/uL    Comment: Performed at [facility], [address], [place], [place] [address]  Troponin I (High Sensitivity)     Status: None   Collection Time: 07/14/22  4:48 PM  Result Value Ref Range   Troponin I (High Sensitivity) 5 <18 ng/L    Comment: (NOTE) Elevated high sensitivity troponin I (hsTnI) values and significant  changes across serial measurements may suggest ACS but many other  chronic and acute conditions are known to elevate hsTnI results.  Refer to the "Links" section for chest pain algorithms and additional  guidance. Performed at [facility], [address], [place], [place] [address]   Urinalysis, w/ Reflex to Culture (Infection Suspected) -Urine, Unspecified Source     Status: Abnormal   Collection Time: 07/14/22  5:20 PM  Result Value Ref Range   Specimen Source URINE, UNSPE    Color, Urine AMBER (A) YELLOW    Comment: BIOCHEMICALS MAY BE AFFECTED BY COLOR   APPearance TURBID (A) CLEAR   Specific Gravity, Urine 1.021 1.005 - 1.030   pH 5.0 5.0 - 8.0   Glucose, UA >=500 (A) NEGATIVE mg/dL   Hgb urine dipstick SMALL (A) NEGATIVE   Bilirubin Urine NEGATIVE NEGATIVE   Ketones, ur NEGATIVE NEGATIVE mg/dL   Protein, ur 098 (A) NEGATIVE mg/dL   Nitrite POSITIVE (A) NEGATIVE   Leukocytes,Ua LARGE (A) NEGATIVE   RBC / HPF 0-5 0 - 5 RBC/hpf   WBC, UA >50 0 - 5 WBC/hpf    Comment:        Reflex urine culture not performed if WBC <=10, OR if Squamous epithelial cells >5. If Squamous epithelial cells >5 suggest recollection.    Bacteria, UA NONE SEEN NONE SEEN   Squamous Epithelial / HPF 0-5 0 - 5 /HPF   WBC Clumps PRESENT    Hyaline Casts, UA PRESENT     Comment: Performed at [facility], [address], [place], [place] [address]  CBG monitoring, ED     Status: Abnormal   Collection Time: 07/14/22  11:43 PM  Result Value Ref Range   Glucose-Capillary 171 (H) 70 - 99 mg/dL    Comment: Glucose reference range applies only to samples taken after fasting for at least 8 hours.   DG Chest Portable 1 View  Result Date: 07/14/2022 CLINICAL DATA:  Shortness of breath. EXAM: PORTABLE CHEST 1 VIEW COMPARISON:  01/21/2022. FINDINGS: Low lung volumes accentuate the pulmonary vasculature and cardiomediastinal silhouette. No consolidation or pulmonary edema. No pleural effusion or pneumothorax. IMPRESSION: Low lung volumes without evidence of acute cardiopulmonary disease. Electronically Signed   By: Orvan Falconer M.D.   On: 07/14/2022 13:52    Pending Labs Unresulted Labs (From admission, onward)     Start     Ordered   07/14/22 2244  Culture, blood (x 2)  BLOOD CULTURE X 2,   R     Comments: INITIATE ANTIBIOTICS WITHIN 1 HOUR AFTER BLOOD CULTURES DRAWN.  If unable to obtain blood cultures, call MD immediately regarding antibiotic instructions.    07/14/22 2244   07/14/22 2244  CBC with Differential  Add-on,  AD        07/14/22 2244   07/14/22 2244  Protime-INR  ONCE - STAT,   STAT        07/14/22 2244   07/14/22 2244  APTT  ONCE - STAT,   STAT        07/14/22 2244   07/14/22 2244  Procalcitonin  ONCE - URGENT,   URGENT       References:    Procalcitonin Lower Respiratory Tract Infection AND Sepsis Procalcitonin Algorithm   07/14/22 2244   07/14/22 2244  Cortisol  ONCE - URGENT,   URGENT        07/14/22 2244   07/14/22 2244  Comprehensive metabolic panel  Add-on,   AD        07/14/22 2244   07/14/22 2243  CK  Add-on,   AD        07/14/22 2243   07/14/22 2243  Magnesium  Add-on,   AD        07/14/22 2243   07/14/22 2243  Phosphorus  Add-on,   AD        07/14/22 2243   07/14/22 2243  TSH  Add-on,   AD        07/14/22 2243   07/14/22 2243  Lactic acid, plasma  STAT Now then every 3 hours,   R     Question:  Release to patient  Answer:  Immediate   07/14/22 2243   07/14/22 2235   Hemoglobin A1c  Add-on,   AD       Comments: To assess prior glycemic control    07/14/22 2235   07/14/22 1720  Urine Culture  Once,   R        07/14/22 1720            Vitals/Pain Today's Vitals   07/14/22 1930 07/14/22 1955 07/14/22 2000 07/14/22 2138  BP: 125/86 135/88 120/67   Pulse: (!) 101 (!) 105 (!) 106   Resp: (!) 28 (!) 22    Temp:    98.2 F (36.8 C)  TempSrc:    Oral  SpO2: 95% 95% 93%   Weight:      Height:      PainSc:        Isolation Precautions No active isolations  Medications Medications  insulin aspart (novoLOG) injection 0-9 Units (has no administration in time range)  Ampicillin-Sulbactam (UNASYN) 3 g in sodium chloride 0.9 % 100 mL IVPB (0 g Intravenous Stopped 07/14/22 2240)    Mobility non-ambulatory     Focused Assessments Pt here with 1 week hx of sob and weakness unable to ambulate or do self care wife to febill to help   R Recommendations: See Admitting Provider Note  Report given to:   Additional Notes: Pt a/o x 4 has been unable to ambulate or perform adls for self x 1 week. Vs 118/69 98 22 94% on 3 liters oxygen via [place] condom cath in place close to 400pounds

## 2022-07-14 NOTE — ED Notes (Signed)
Assumed care of pt who presented for sob and weakness  . Pt is hypotensive and has urinated himself. In patient hospital bed has been ordered as pt too large for hospital gurney, As soon as bed arrived pt will be cleaned and transferred. Pt to be admitted per MD.

## 2022-07-14 NOTE — Assessment & Plan Note (Addendum)
Repeat echogram last EF unclear.  Given hypotension will hold Entresto and spironolactone.  Monitor blood pressures try to resume home medications as able.  Will benefit from cardiology consult.  Troponin unremarkable chest are encouraging.

## 2022-07-15 ENCOUNTER — Encounter (HOSPITAL_COMMUNITY): Payer: Self-pay | Admitting: Internal Medicine

## 2022-07-15 ENCOUNTER — Ambulatory Visit: Payer: Medicare Other | Admitting: Student

## 2022-07-15 ENCOUNTER — Inpatient Hospital Stay (HOSPITAL_COMMUNITY): Payer: Medicare Other

## 2022-07-15 DIAGNOSIS — N390 Urinary tract infection, site not specified: Secondary | ICD-10-CM | POA: Diagnosis not present

## 2022-07-15 DIAGNOSIS — G4733 Obstructive sleep apnea (adult) (pediatric): Secondary | ICD-10-CM | POA: Diagnosis not present

## 2022-07-15 DIAGNOSIS — I10 Essential (primary) hypertension: Secondary | ICD-10-CM | POA: Diagnosis not present

## 2022-07-15 DIAGNOSIS — I5020 Unspecified systolic (congestive) heart failure: Secondary | ICD-10-CM | POA: Diagnosis not present

## 2022-07-15 DIAGNOSIS — A419 Sepsis, unspecified organism: Secondary | ICD-10-CM | POA: Diagnosis not present

## 2022-07-15 LAB — GLUCOSE, CAPILLARY
Glucose-Capillary: 187 mg/dL — ABNORMAL HIGH (ref 70–99)
Glucose-Capillary: 148 mg/dL — ABNORMAL HIGH (ref 70–99)
Glucose-Capillary: 210 mg/dL — ABNORMAL HIGH (ref 70–99)
Glucose-Capillary: 201 mg/dL — ABNORMAL HIGH (ref 70–99)
Glucose-Capillary: 225 mg/dL — ABNORMAL HIGH (ref 70–99)

## 2022-07-15 LAB — CBC WITH DIFFERENTIAL/PLATELET
Abs Immature Granulocytes: 0.02 10*3/uL (ref 0.00–0.07)
Basophils Absolute: 0 10*3/uL (ref 0.0–0.1)
Basophils Relative: 0 %
Eosinophils Absolute: 0 10*3/uL (ref 0.0–0.5)
Eosinophils Relative: 0 %
HCT: 43.8 % (ref 39.0–52.0)
Hemoglobin: 14.5 g/dL (ref 13.0–17.0)
Immature Granulocytes: 0 %
Lymphocytes Relative: 9 %
Lymphs Abs: 0.7 10*3/uL (ref 0.7–4.0)
MCH: 31.3 pg (ref 26.0–34.0)
MCHC: 33.1 g/dL (ref 30.0–36.0)
MCV: 94.6 fL (ref 80.0–100.0)
Monocytes Absolute: 0.5 10*3/uL (ref 0.1–1.0)
Monocytes Relative: 7 %
Neutro Abs: 6.9 10*3/uL (ref 1.7–7.7)
Neutrophils Relative %: 84 %
Platelets: 153 10*3/uL (ref 150–400)
RBC: 4.63 MIL/uL (ref 4.22–5.81)
RDW: 15 % (ref 11.5–15.5)
WBC: 8.2 10*3/uL (ref 4.0–10.5)
nRBC: 0 % (ref 0.0–0.2)

## 2022-07-15 LAB — COMPREHENSIVE METABOLIC PANEL
ALT: 24 U/L (ref 0–44)
ALT: 26 U/L (ref 0–44)
AST: 49 U/L — ABNORMAL HIGH (ref 15–41)
AST: 33 U/L (ref 15–41)
Albumin: 3.3 g/dL — ABNORMAL LOW (ref 3.5–5.0)
Albumin: 3.3 g/dL — ABNORMAL LOW (ref 3.5–5.0)
Alkaline Phosphatase: 42 U/L (ref 38–126)
Alkaline Phosphatase: 43 U/L (ref 38–126)
Anion gap: 12 (ref 5–15)
Anion gap: 13 (ref 5–15)
BUN: 25 mg/dL — ABNORMAL HIGH (ref 8–23)
BUN: 23 mg/dL (ref 8–23)
CO2: 24 mmol/L (ref 22–32)
CO2: 25 mmol/L (ref 22–32)
Calcium: 9 mg/dL (ref 8.9–10.3)
Calcium: 9.1 mg/dL (ref 8.9–10.3)
Chloride: 101 mmol/L (ref 98–111)
Chloride: 101 mmol/L (ref 98–111)
Creatinine, Ser: 1.53 mg/dL — ABNORMAL HIGH (ref 0.61–1.24)
Creatinine, Ser: 1.46 mg/dL — ABNORMAL HIGH (ref 0.61–1.24)
GFR, Estimated: 48 mL/min — ABNORMAL LOW (ref >60)
GFR, Estimated: 51 mL/min — ABNORMAL LOW (ref >60)
Glucose, Bld: 179 mg/dL — ABNORMAL HIGH (ref 70–99)
Glucose, Bld: 185 mg/dL — ABNORMAL HIGH (ref 70–99)
Potassium: 5.1 mmol/L (ref 3.5–5.1)
Potassium: 4.1 mmol/L (ref 3.5–5.1)
Sodium: 137 mmol/L (ref 135–145)
Sodium: 139 mmol/L (ref 135–145)
Total Bilirubin: 1.9 mg/dL — ABNORMAL HIGH (ref 0.3–1.2)
Total Bilirubin: 1 mg/dL (ref 0.3–1.2)
Total Protein: 6.9 g/dL (ref 6.5–8.1)
Total Protein: 7.3 g/dL (ref 6.5–8.1)

## 2022-07-15 LAB — MAGNESIUM
Magnesium: 2.3 mg/dL (ref 1.7–2.4)
Magnesium: 2.4 mg/dL (ref 1.7–2.4)

## 2022-07-15 LAB — ECHOCARDIOGRAM COMPLETE
AR max vel: 3.94 cm2
AV Area VTI: 4.16 cm2
AV Area mean vel: 4.04 cm2
AV Mean grad: 3 mmHg
AV Peak grad: 6.9 mmHg
Ao pk vel: 1.31 m/s
Area-P 1/2: 2.82 cm2
Height: 66 in
Weight: 6102.33 oz

## 2022-07-15 LAB — PHOSPHORUS
Phosphorus: 2.6 mg/dL (ref 2.5–4.6)
Phosphorus: 2.4 mg/dL — ABNORMAL LOW (ref 2.5–4.6)

## 2022-07-15 LAB — CBC
HCT: 43.1 % (ref 39.0–52.0)
Hemoglobin: 14.1 g/dL (ref 13.0–17.0)
MCH: 31 pg (ref 26.0–34.0)
MCHC: 32.7 g/dL (ref 30.0–36.0)
MCV: 94.7 fL (ref 80.0–100.0)
Platelets: 166 10*3/uL (ref 150–400)
RBC: 4.55 MIL/uL (ref 4.22–5.81)
RDW: 15 % (ref 11.5–15.5)
WBC: 8.5 10*3/uL (ref 4.0–10.5)
nRBC: 0 % (ref 0.0–0.2)

## 2022-07-15 LAB — TSH: TSH: 0.834 u[IU]/mL (ref 0.350–4.500)

## 2022-07-15 LAB — LACTIC ACID, PLASMA
Lactic Acid, Venous: 1.3 mmol/L (ref 0.5–1.9)
Lactic Acid, Venous: 1.9 mmol/L (ref 0.5–1.9)
Lactic Acid, Venous: 2.6 mmol/L (ref 0.5–1.9)

## 2022-07-15 LAB — PROTIME-INR
INR: 1.2 (ref 0.8–1.2)
Prothrombin Time: 14.8 seconds (ref 11.4–15.2)

## 2022-07-15 LAB — URINE CULTURE

## 2022-07-15 LAB — SARS CORONAVIRUS 2 BY RT PCR: SARS Coronavirus 2 by RT PCR: NEGATIVE

## 2022-07-15 LAB — CK: Total CK: 376 U/L (ref 49–397)

## 2022-07-15 LAB — TROPONIN I (HIGH SENSITIVITY)
Troponin I (High Sensitivity): 20 ng/L — ABNORMAL HIGH (ref <18)
Troponin I (High Sensitivity): 26 ng/L — ABNORMAL HIGH (ref <18)

## 2022-07-15 LAB — PROCALCITONIN: Procalcitonin: 0.66 ng/mL

## 2022-07-15 LAB — HEMOGLOBIN A1C
Hgb A1c MFr Bld: 7.3 % — ABNORMAL HIGH (ref 4.8–5.6)
Mean Plasma Glucose: 162.81 mg/dL

## 2022-07-15 LAB — APTT: aPTT: 28 seconds (ref 24–36)

## 2022-07-15 LAB — CORTISOL: Cortisol, Plasma: 24.8 ug/dL

## 2022-07-15 MED ORDER — ONDANSETRON HCL 4 MG/2ML IJ SOLN: 4.0000 mg | Freq: Four times a day (QID) | INTRAMUSCULAR | Status: DC | PRN

## 2022-07-15 MED ORDER — ALBUTEROL SULFATE (2.5 MG/3ML) 0.083% IN NEBU: 2.5000 mg | INHALATION_SOLUTION | RESPIRATORY_TRACT | Status: DC | PRN

## 2022-07-15 MED ORDER — ACETAMINOPHEN 325 MG PO TABS
650.0000 mg | ORAL_TABLET | Freq: Four times a day (QID) | ORAL | Status: DC | PRN
  Administered 2022-07-15: 650 mg via ORAL
  Filled 2022-07-15: qty 2

## 2022-07-15 MED ORDER — ASPIRIN 81 MG PO TBEC
81.0000 mg | DELAYED_RELEASE_TABLET | Freq: Every day | ORAL | Status: DC
  Administered 2022-07-15 – 2022-07-17 (×3): 81 mg via ORAL
  Filled 2022-07-15 (×3): qty 1

## 2022-07-15 MED ORDER — GUAIFENESIN ER 600 MG PO TB12
600.0000 mg | ORAL_TABLET | Freq: Two times a day (BID) | ORAL | Status: DC
  Administered 2022-07-15 – 2022-07-17 (×6): 600 mg via ORAL
  Filled 2022-07-15 (×6): qty 1

## 2022-07-15 MED ORDER — PANTOPRAZOLE SODIUM 40 MG PO TBEC
40.0000 mg | DELAYED_RELEASE_TABLET | Freq: Two times a day (BID) | ORAL | Status: DC
  Administered 2022-07-15 – 2022-07-17 (×6): 40 mg via ORAL
  Filled 2022-07-15 (×6): qty 1

## 2022-07-15 MED ORDER — ACETAMINOPHEN 650 MG RE SUPP: 650.0000 mg | Freq: Four times a day (QID) | RECTAL | Status: DC | PRN

## 2022-07-15 MED ORDER — HYDROCODONE-ACETAMINOPHEN 5-325 MG PO TABS
1.0000 | ORAL_TABLET | ORAL | Status: DC | PRN
  Administered 2022-07-15: 1 via ORAL
  Filled 2022-07-15: qty 1

## 2022-07-15 MED ORDER — TRAZODONE HCL 50 MG PO TABS
50.0000 mg | ORAL_TABLET | ORAL | Status: AC
  Administered 2022-07-15: 50 mg via ORAL
  Filled 2022-07-15: qty 1

## 2022-07-15 MED ORDER — TRAZODONE HCL 50 MG PO TABS
300.0000 mg | ORAL_TABLET | Freq: Every day | ORAL | Status: DC
  Administered 2022-07-15 – 2022-07-17 (×2): 300 mg via ORAL
  Filled 2022-07-15 (×2): qty 6

## 2022-07-15 MED ORDER — IPRATROPIUM-ALBUTEROL 0.5-2.5 (3) MG/3ML IN SOLN
3.0000 mL | Freq: Two times a day (BID) | RESPIRATORY_TRACT | Status: DC
  Administered 2022-07-16: 3 mL via RESPIRATORY_TRACT
  Filled 2022-07-15 (×3): qty 3

## 2022-07-15 MED ORDER — TICAGRELOR 90 MG PO TABS
90.0000 mg | ORAL_TABLET | Freq: Two times a day (BID) | ORAL | Status: DC
  Administered 2022-07-15 – 2022-07-17 (×6): 90 mg via ORAL
  Filled 2022-07-15 (×6): qty 1

## 2022-07-15 MED ORDER — IOHEXOL 350 MG/ML SOLN
75.0000 mL | Freq: Once | INTRAVENOUS | Status: AC | PRN
  Administered 2022-07-15: 75 mL via INTRAVENOUS

## 2022-07-15 MED ORDER — SODIUM CHLORIDE 0.9 % IV SOLN
3.0000 g | Freq: Four times a day (QID) | INTRAVENOUS | Status: DC
  Administered 2022-07-15 – 2022-07-16 (×6): 3 g via INTRAVENOUS
  Filled 2022-07-15 (×5): qty 8

## 2022-07-15 MED ORDER — SODIUM CHLORIDE 0.9 % IV SOLN: INTRAVENOUS | Status: AC

## 2022-07-15 MED ORDER — ONDANSETRON HCL 4 MG PO TABS: 4.0000 mg | ORAL_TABLET | Freq: Four times a day (QID) | ORAL | Status: DC | PRN

## 2022-07-15 MED ORDER — POLYETHYLENE GLYCOL 3350 17 G PO PACK
17.0000 g | PACK | Freq: Every day | ORAL | Status: DC | PRN
  Administered 2022-07-15: 17 g via ORAL
  Filled 2022-07-15: qty 1

## 2022-07-15 MED ORDER — DOCUSATE SODIUM 100 MG PO CAPS
100.0000 mg | ORAL_CAPSULE | Freq: Two times a day (BID) | ORAL | Status: DC
  Administered 2022-07-15 (×2): 100 mg via ORAL
  Filled 2022-07-15 (×5): qty 1

## 2022-07-15 MED ORDER — IPRATROPIUM-ALBUTEROL 0.5-2.5 (3) MG/3ML IN SOLN
3.0000 mL | Freq: Three times a day (TID) | RESPIRATORY_TRACT | Status: DC
  Administered 2022-07-15 (×2): 3 mL via RESPIRATORY_TRACT
  Filled 2022-07-15 (×2): qty 3

## 2022-07-15 MED ORDER — ATORVASTATIN CALCIUM 80 MG PO TABS
80.0000 mg | ORAL_TABLET | Freq: Every day | ORAL | Status: DC
  Administered 2022-07-15 – 2022-07-16 (×2): 80 mg via ORAL
  Filled 2022-07-15 (×2): qty 1

## 2022-07-15 MED ORDER — SENNA 8.6 MG PO TABS
1.0000 | ORAL_TABLET | Freq: Two times a day (BID) | ORAL | Status: DC
  Administered 2022-07-15 (×2): 8.6 mg via ORAL
  Filled 2022-07-15 (×5): qty 1

## 2022-07-15 MED ORDER — PERFLUTREN LIPID MICROSPHERE
1.0000 mL | INTRAVENOUS | Status: AC | PRN
  Administered 2022-07-15: 2 mL via INTRAVENOUS

## 2022-07-15 MED ORDER — PREGABALIN 100 MG PO CAPS
200.0000 mg | ORAL_CAPSULE | Freq: Three times a day (TID) | ORAL | Status: DC
  Administered 2022-07-15 – 2022-07-17 (×7): 200 mg via ORAL
  Filled 2022-07-15 (×7): qty 2

## 2022-07-15 MED ORDER — BISACODYL 10 MG RE SUPP: 10.0000 mg | Freq: Every day | RECTAL | Status: DC | PRN

## 2022-07-15 NOTE — Evaluation (Signed)
Physical Therapy Evaluation Patient Details Name: Phillip Maldonado MRN: [id number] DOB: [birth year] Today's Date: 07/15/2022  History of Present Illness  Pt is a 72 year old man admitted on 07/14/22 with shortness of breath and weakness in the setting of partially treated UTI. + sepsis. PMH include, but not limited to CHF, DM2, HTL, HTN, venous stasis, dermatitis, CKD, OSA, afib, alcohol abuse, CAD, liver disease, back and neck surgery, R shoulder fx.   Clinical Impression  Phillip Maldonado is 72 y.o. male admitted with above HPI and diagnosis. Patient is currently limited by functional impairments below (see PT problem list). Patient lives with spouse and requires assist from spouse for ADL's and self care and mobilizes with rollator in home and electric scooter for community distances at baseline. Currently pt requires Min guard and use of bed features to complete bed mobility and Mod+2 assist for sit<>stand and transfer bed>chair. Patient will benefit from continued skilled PT interventions to address impairments and progress independence with mobility. Acute PT will follow and progress as able.        Recommendations for follow up therapy are one component of a multi-disciplinary discharge planning process, led by the attending physician.  Recommendations may be updated based on patient status, additional functional criteria and insurance authorization.  Follow Up Recommendations Can patient physically be transported by private vehicle: No     Assistance Recommended at Discharge Frequent or constant Supervision/Assistance  Patient can return home with the following  Two people to help with walking and/or transfers;Two people to help with bathing/dressing/bathroom;Direct supervision/assist for medications management;Assist for transportation;Help with stairs or ramp for entrance;Assistance with cooking/housework    Equipment Recommendations  (TBD)  Recommendations for Other Services        Functional Status Assessment Patient has had a recent decline in their functional status and demonstrates the ability to make significant improvements in function in a reasonable and predictable amount of time.     Precautions / Restrictions Precautions Precautions: Fall Restrictions Weight Bearing Restrictions: No      Mobility  Bed Mobility Overal bed mobility: Needs Assistance Bed Mobility: Supine to Sit     Supine to sit: Min guard, HOB elevated     General bed mobility comments: increased time, cues to self assist using bed rail    Transfers Overall transfer level: Needs assistance Equipment used: Rolling walker (2 wheels) Transfers: Sit to/from Stand, Bed to chair/wheelchair/BSC Sit to Stand: +2 physical assistance, Mod assist   Step pivot transfers: +2 physical assistance, Min assist       General transfer comment: use of momentum, assist to rise and steady, pt able to take pivotal steps to recliner    Ambulation/Gait                  Stairs            Wheelchair Mobility    Modified Rankin (Stroke Patients Only)       Balance Overall balance assessment: Needs assistance   Sitting balance-Leahy Scale: Fair     Standing balance support: Bilateral upper extremity supported Standing balance-Leahy Scale: Poor Standing balance comment: B UE and external support                             Pertinent Vitals/Pain Pain Assessment Pain Assessment: No/denies pain    Home Living Family/patient expects to be discharged to:: Private residence Living Arrangements: Spouse/significant other Available Help at Discharge: Family;Available  24 hours/day Type of Home: Apartment Home Access: Level entry       Home Layout: One level Home Equipment: Tub bench;Grab bars - tub/shower;Electric scooter;Cane - single point;Rollator (4 wheels);Rolling Walker (2 wheels);BSC/3in1      Prior Function Prior Level of Function : Needs assist              Mobility Comments: walks with rollator or uses electric scooter for longer distances ADLs Comments: wife helps with pericare and LB ADLs and IADLs     Hand Dominance   Dominant Hand: Right (but uses L as lead due to shoulder limitations)    Extremity/Trunk Assessment   Upper Extremity Assessment Upper Extremity Assessment: RUE deficits/detail RUE Deficits / Details: longstanding shoulder limitations from prior fracture RUE Coordination: decreased gross motor    Lower Extremity Assessment Lower Extremity Assessment: Generalized weakness;RLE deficits/detail;LLE deficits/detail RLE Sensation: history of peripheral neuropathy LLE Sensation: history of peripheral neuropathy    Cervical / Trunk Assessment Cervical / Trunk Assessment: Back Surgery;Neck Surgery;Other exceptions Cervical / Trunk Exceptions: obesity  Communication   Communication: No difficulties  Cognition Arousal/Alertness: Awake/alert Behavior During Therapy: Flat affect Overall Cognitive Status: Within Functional Limits for tasks assessed                                 General Comments: some slow processing        General Comments      Exercises     Assessment/Plan    PT Assessment Patient needs continued PT services  PT Problem List Decreased strength;Decreased activity tolerance;Decreased balance;Decreased mobility;Decreased knowledge of use of DME;Decreased safety awareness;Decreased knowledge of precautions;Cardiopulmonary status limiting activity;Impaired sensation;Obesity;Decreased skin integrity       PT Treatment Interventions DME instruction;Gait training;Stair training;Functional mobility training;Therapeutic activities;Therapeutic exercise;Balance training;Neuromuscular re-education;Patient/family education    PT Goals (Current goals can be found in the Care Plan section)  Acute Rehab PT Goals Patient Stated Goal: get better and home to spouse PT Goal  Formulation: With patient Time For Goal Achievement: 07/29/22 Potential to Achieve Goals: Good    Frequency Min 3X/week     Co-evaluation PT/OT/SLP Co-Evaluation/Treatment: Yes Reason for Co-Treatment: For patient/therapist safety PT goals addressed during session: Mobility/safety with mobility;Balance;Proper use of DME OT goals addressed during session: ADL's and self-care       AM-PAC PT "6 Clicks" Mobility  Outcome Measure Help needed turning from your back to your side while in a flat bed without using bedrails?: A Lot Help needed moving from lying on your back to sitting on the side of a flat bed without using bedrails?: A Little Help needed moving to and from a bed to a chair (including a wheelchair)?: A Lot Help needed standing up from a chair using your arms (e.g., wheelchair or bedside chair)?: A Lot Help needed to walk in hospital room?: Total Help needed climbing 3-5 steps with a railing? : Total 6 Click Score: 11    End of Session Equipment Utilized During Treatment: Gait belt Activity Tolerance: Patient tolerated treatment well Patient left: in chair;with call bell/phone within reach;with chair alarm set Nurse Communication: Mobility status;Need for lift equipment (2+) PT Visit Diagnosis: Unsteadiness on feet (R26.81);Other abnormalities of gait and mobility (R26.89);Muscle weakness (generalized) (M62.81);Difficulty in walking, not elsewhere classified (R26.2)    Time: 1610-9604 PT Time Calculation (min) (ACUTE ONLY): 35 min   Charges:   PT Evaluation $PT Eval Moderate Complexity: 1 Mod  Wynn Maudlin, DPT Acute Rehabilitation Services Office [phone]  07/15/22 12:38 PM

## 2022-07-15 NOTE — ED Notes (Signed)
Night labs drawn pt to CT then to floor

## 2022-07-15 NOTE — Evaluation (Signed)
Occupational Therapy Evaluation Patient Details Name: Phillip Maldonado MRN: [id number] DOB: 1950/12/11 Today's Date: 07/15/2022   History of Present Illness Pt is a 72 year old man admitted on 07/14/22 with shortness of breath and weakness in the setting of partially treated UTI. + sepsis. PMH include, but not limited to CHF, DM2, HTL, HTN, venous stasis, dermatitis, CKD, OSA, afib, alcohol abuse, CAD, liver disease, back and neck surgery, R shoulder fx.   Clinical Impression   Pt lives with his supportive wife who helps him with LB ADLs, pericare and all IADLs. Pt typically walks with a rollator he reports is in disrepair, and uses an electric scooter for longer distances. Pt presents with generalized weakness, decreased activity tolerance and impaired standing balance. He requires 2 person assist to stand and pivot with RW to chair and set up to total assist for ADLs. Recommending post acute rehab <3 hours a day prior to return home.      Recommendations for follow up therapy are one component of a multi-disciplinary discharge planning process, led by the attending physician.  Recommendations may be updated based on patient status, additional functional criteria and insurance authorization.   Assistance Recommended at Discharge Frequent or constant Supervision/Assistance  Patient can return home with the following Two people to help with walking and/or transfers;A lot of help with bathing/dressing/bathroom;Assistance with cooking/housework;Assist for transportation;Help with stairs or ramp for entrance    Functional Status Assessment  Patient has had a recent decline in their functional status and demonstrates the ability to make significant improvements in function in a reasonable and predictable amount of time.  Equipment Recommendations  None recommended by OT    Recommendations for Other Services       Precautions / Restrictions Precautions Precautions: Fall Restrictions Weight  Bearing Restrictions: No      Mobility Bed Mobility Overal bed mobility: Needs Assistance Bed Mobility: Supine to Sit     Supine to sit: Min guard, HOB elevated     General bed mobility comments: increased time, cues to self assist using bed rail    Transfers Overall transfer level: Needs assistance Equipment used: Rolling walker (2 wheels) Transfers: Sit to/from Stand, Bed to chair/wheelchair/BSC Sit to Stand: +2 physical assistance, Mod assist     Step pivot transfers: +2 physical assistance, Min assist     General transfer comment: use of momentum, assist to rise and steady, pt able to take pivotal steps to recliner      Balance Overall balance assessment: Needs assistance   Sitting balance-Leahy Scale: Fair     Standing balance support: Bilateral upper extremity supported Standing balance-Leahy Scale: Poor Standing balance comment: B UE and external support                           ADL either performed or assessed with clinical judgement   ADL Overall ADL's : Needs assistance/impaired Eating/Feeding: Independent;Sitting   Grooming: Wash/dry face;Sitting;Supervision/safety   Upper Body Bathing: Minimal assistance;Sitting   Lower Body Bathing: +2 for physical assistance;Total assistance;Sit to/from stand   Upper Body Dressing : Sitting;Supervision/safety   Lower Body Dressing: Total assistance;Bed level   Toilet Transfer: +2 for physical assistance;Minimal assistance;Stand-pivot;Rolling walker (2 wheels)   Toileting- Clothing Manipulation and Hygiene: Total assistance;+2 for physical assistance;Sit to/from stand               Vision Baseline Vision/History: 1 Wears glasses Ability to See in Adequate Light: 0 Adequate Patient Visual Report:  No change from baseline       Perception     Praxis      Pertinent Vitals/Pain Pain Assessment Pain Assessment: No/denies pain     Hand Dominance Right (but uses L as lead due to shoulder  limitations)   Extremity/Trunk Assessment Upper Extremity Assessment Upper Extremity Assessment: RUE deficits/detail RUE Deficits / Details: longstanding shoulder limitations from prior fracture RUE Coordination: decreased gross motor   Lower Extremity Assessment Lower Extremity Assessment: Defer to PT evaluation   Cervical / Trunk Assessment Cervical / Trunk Assessment: Back Surgery;Neck Surgery;Other exceptions Cervical / Trunk Exceptions: obesity   Communication Communication Communication: No difficulties   Cognition Arousal/Alertness: Awake/alert Behavior During Therapy: Flat affect Overall Cognitive Status: Within Functional Limits for tasks assessed                                 General Comments: some slow processing     General Comments       Exercises     Shoulder Instructions      Home Living Family/patient expects to be discharged to:: Private residence Living Arrangements: Spouse/significant other Available Help at Discharge: Family;Available 24 hours/day Type of Home: Apartment Home Access: Level entry     Home Layout: One level     Bathroom Shower/Tub: Chief Strategy Officer: Handicapped height     Home Equipment: Tub bench;Grab bars - tub/shower;Electric scooter;Cane - single point;Rollator (4 wheels);Rolling Walker (2 wheels);BSC/3in1          Prior Functioning/Environment Prior Level of Function : Needs assist             Mobility Comments: walks with rollator or uses electric scooter for longer distances ADLs Comments: wife helps with pericare and LB ADLs and IADLs        OT Problem List: Decreased strength;Impaired balance (sitting and/or standing);Decreased coordination;Impaired UE functional use;Decreased activity tolerance      OT Treatment/Interventions: Self-care/ADL training;DME and/or AE instruction;Therapeutic activities;Patient/family education;Balance training    OT Goals(Current goals can  be found in the care plan section) Acute Rehab OT Goals OT Goal Formulation: With patient Time For Goal Achievement: 07/29/22 Potential to Achieve Goals: Good ADL Goals Pt Will Perform Lower Body Bathing: with mod assist;with adaptive equipment;sit to/from stand Pt Will Perform Lower Body Dressing: with mod assist;sit to/from stand;with adaptive equipment Pt Will Transfer to Toilet: with mod assist;stand pivot transfer;bedside commode Pt Will Perform Toileting - Clothing Manipulation and hygiene: with mod assist;with adaptive equipment;sit to/from stand  OT Frequency: Min 2X/week    Co-evaluation PT/OT/SLP Co-Evaluation/Treatment: Yes Reason for Co-Treatment: For patient/therapist safety   OT goals addressed during session: ADL's and self-care      AM-PAC OT "6 Clicks" Daily Activity     Outcome Measure Help from another person eating meals?: None Help from another person taking care of personal grooming?: A Little Help from another person toileting, which includes using toliet, bedpan, or urinal?: Total Help from another person bathing (including washing, rinsing, drying)?: A Lot Help from another person to put on and taking off regular upper body clothing?: A Little Help from another person to put on and taking off regular lower body clothing?: Total 6 Click Score: 14   End of Session Equipment Utilized During Treatment: Rolling walker (2 wheels) Nurse Communication: Mobility status;Other (comment) (IV came out during transfer)  Activity Tolerance: Patient tolerated treatment well Patient left: in chair;with call bell/phone within  reach;with chair alarm set  OT Visit Diagnosis: Unsteadiness on feet (R26.81);Other abnormalities of gait and mobility (R26.89);Muscle weakness (generalized) (M62.81)                Time: 1610-9604 OT Time Calculation (min): 41 min Charges:  OT General Charges $OT Visit: 1 Visit OT Evaluation $OT Eval Moderate Complexity: 1 Mod  Berna Spare,  OTR/L Acute Rehabilitation Services Office: [phone]  Evern Bio 07/15/2022, 11:28 AM

## 2022-07-15 NOTE — Progress Notes (Signed)
PROGRESS NOTE        PATIENT DETAILS Name: Phillip Maldonado Age: 72 y.o. Sex: male Date of Birth: [birth year] Admit Date: 07/14/2022 Admitting Physician Therisa Doyne, MD NWG:NFAOZ, Arvid Right, MD  Brief Summary: Patient is a 72 y.o.  male with history of DM-2, HTN, HLD, CAD-s/p PCI November 2023 chronic HFrEF, OSA-noncompliant with CPAP-recent enterococcal UTI-presented with weakness, fever, dysuria-thought to have sepsis in the setting of partially treated UTI (on Omnicef) and subsequently admitted to the hospitalist service.  Significant events: 4/30>> admit to [facility].  Significant studies: 5/01>> CT abdomen/pelvis: No acute intra-abdominal or pelvic pathology. 5/01>> CT angio chest: No PE  Significant microbiology data: 4/12>> Enterococcus faecalis (pansensitive-done at [facility]) 4/30>> COVID PCR: Negative 5/01>> blood culture: Pending 5/01>> urine culture: Pending  Procedures: None  Consults: None  Subjective: Seen earlier this morning-sleeping-but easily awakes-answers questions appropriately-moves all 4 extremities.  Objective: Vitals: Blood pressure (!) 140/79, pulse 72, temperature 98.7 F (37.1 C), temperature source Oral, resp. rate 20, height 5\' 6"  (1.676 m), weight (!) 173 kg, SpO2 92 %.   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 Neurology: Non focal-generalized weakness. Skin: no rash  Pertinent Labs/Radiology:    Latest Ref Rng & Units 07/15/2022    2:24 AM 07/14/2022   11:59 PM 07/14/2022   12:58 PM  CBC  WBC 4.0 - 10.5 K/uL 8.5  8.2  10.8   Hemoglobin 13.0 - 17.0 g/dL 30.8  65.7  84.6   Hematocrit 39.0 - 52.0 % 43.1  43.8  42.6   Platelets 150 - 400 K/uL 166  153  164     Lab Results  Component Value Date    NA 139 07/15/2022   K 4.1 07/15/2022   CL 101 07/15/2022   CO2 25 07/15/2022     Assessment/Plan: Severe sepsis (POA) secondary to complicated enterococcal UTI Essentially unchanged-still weak-Tmax 102 F overnight.  Apparently was on Omnicef prior to this hospitalization-and continued to worsen. Continue Unasyn Await cultures Follow clinical course  Acute hypoxic respiratory failure Requiring around 2-3 L of oxygen-suspect this is due to atelectasis/mucous plugging Incentive spirometry Bronchodilators Mobilize  HTN BP soft this morning-hold all antihypertensives  DM-2 (A1c 7.3 on 4/30) CBG stable with SSI Resume metformin/Trulicity on discharge.   Recent Labs    07/14/22 2343 07/15/22 0353  GLUCAP 171* 187*    HFrEF Euvolemic Coreg/losartan/Aldactone/Demadex on hold due to soft BP-resume over the next several days.  CAD s/p PCI November 2023 No anginal symptoms-trop not elevated Continue aspirin/Brilinta/statin  CKD stage IIIa Close to baseline  Peripheral neuropathy Stable Continue Lyrica  OSA-noncompliant to CPAP  Generalized weakness/debility/deconditioning Likely due to acute illness PT/OT  4.1 cm complex cyst right kidney Outpatient dedicated MRI recommended by radiology  Morbid Obesity: Estimated body mass index is 61.56 kg/m as calculated from the following:   Height as of this encounter: 5\' 6"  (1.676 m).   Weight as of this encounter: 173 kg.   Code status:   Code Status: Full Code   DVT Prophylaxis: SCDs Start: 07/15/22 0204   Family Communication: Spouse- Vickie-[phone]-updated over the phone 5/1   Disposition Plan: Status is: Inpatient Remains inpatient appropriate because: Severity of illness   Planned Discharge Destination:Home  health   Diet: Diet Order             Diet Carb Modified Fluid consistency: Thin; Room service appropriate? No  Diet effective now                     Antimicrobial  agents: Anti-infectives (From admission, onward)    Start     Dose/Rate Route Frequency Ordered Stop   07/15/22 0400  Ampicillin-Sulbactam (UNASYN) 3 g in sodium chloride 0.9 % 100 mL IVPB        3 g 200 mL/hr over 30 Minutes Intravenous Every 6 hours 07/15/22 0123     07/14/22 2130  Ampicillin-Sulbactam (UNASYN) 3 g in sodium chloride 0.9 % 100 mL IVPB        3 g 200 mL/hr over 30 Minutes Intravenous  Once 07/14/22 2119 07/14/22 2240        MEDICATIONS: Scheduled Meds:  aspirin EC  81 mg Oral Daily   atorvastatin  80 mg Oral QHS   docusate sodium  100 mg Oral BID   guaiFENesin  600 mg Oral BID   insulin aspart  0-9 Units Subcutaneous Q4H   pantoprazole  40 mg Oral BID   pregabalin  200 mg Oral TID   senna  1 tablet Oral BID   ticagrelor  90 mg Oral BID   trazodone  300 mg Oral QHS   Continuous Infusions:  sodium chloride 75 mL/hr at 07/15/22 0238   ampicillin-sulbactam (UNASYN) IV 3 g (07/15/22 1037)   PRN Meds:.acetaminophen **OR** acetaminophen, albuterol, bisacodyl, HYDROcodone-acetaminophen, ondansetron **OR** ondansetron (ZOFRAN) IV, polyethylene glycol   I have personally reviewed following labs and imaging studies  LABORATORY DATA: CBC: Recent Labs  Lab 07/14/22 1258 07/14/22 2359 07/15/22 0224  WBC 10.8* 8.2 8.5  NEUTROABS 9.4* 6.9  --   HGB 14.0 14.5 14.1  HCT 42.6 43.8 43.1  MCV 94.5 94.6 94.7  PLT 164 153 166    Basic Metabolic Panel: Recent Labs  Lab 07/14/22 1258 07/14/22 2359 07/15/22 0224  NA 138 137 139  K 4.5 5.1 4.1  CL 100 101 101  CO2 22 24 25   GLUCOSE 161* 179* 185*  BUN 31* 25* 23  CREATININE 1.57* 1.53* 1.46*  CALCIUM 9.3 9.0 9.1  MG  --  2.3 2.4  PHOS  --  2.6 2.4*    GFR: Estimated Creatinine Clearance: 70.6 mL/min (A) (by C-G formula based on SCr of 1.46 mg/dL (H)).  Liver Function Tests: Recent Labs  Lab 07/14/22 1258 07/14/22 2359 07/15/22 0224  AST 26 49* 33  ALT 20 24 26   ALKPHOS 41 42 43  BILITOT 1.0  1.9* 1.0  PROT 6.8 6.9 7.3  ALBUMIN 3.3* 3.3* 3.3*   No results for input(s): "LIPASE", "AMYLASE" in the last 168 hours. No results for input(s): "AMMONIA" in the last 168 hours.  Coagulation Profile: Recent Labs  Lab 07/14/22 2359  INR 1.2    Cardiac Enzymes: Recent Labs  Lab 07/14/22 2359  CKTOTAL 376    BNP (last 3 results) No results for input(s): "PROBNP" in the last 8760 hours.  Lipid Profile: No results for input(s): "CHOL", "HDL", "LDLCALC", "TRIG", "CHOLHDL", "LDLDIRECT" in the last 72 hours.  Thyroid Function Tests: Recent Labs    07/14/22 2359  TSH 0.834    Anemia Panel: No results for input(s): "VITAMINB12", "FOLATE", "FERRITIN", "TIBC", "IRON", "RETICCTPCT" in the last 72 hours.  Urine analysis:    Component Value Date/Time   COLORURINE  AMBER (A) 07/14/2022 1720   APPEARANCEUR TURBID (A) 07/14/2022 1720   LABSPEC 1.021 07/14/2022 1720   PHURINE 5.0 07/14/2022 1720   GLUCOSEU >=500 (A) 07/14/2022 1720   HGBUR SMALL (A) 07/14/2022 1720   BILIRUBINUR NEGATIVE 07/14/2022 1720   BILIRUBINUR Large 12/30/2015 1600   KETONESUR NEGATIVE 07/14/2022 1720   PROTEINUR 100 (A) 07/14/2022 1720   UROBILINOGEN 0.2 12/30/2015 1600   UROBILINOGEN 1.0 09/15/2013 1855   NITRITE POSITIVE (A) 07/14/2022 1720   LEUKOCYTESUR LARGE (A) 07/14/2022 1720    Sepsis Labs: Lactic Acid, Venous    Component Value Date/Time   LATICACIDVEN 1.3 07/15/2022 0551    MICROBIOLOGY: Recent Results (from the past 240 hour(s))  SARS Coronavirus 2 by RT PCR (hospital order, performed in [facility] hospital lab) *cepheid single result test* Anterior Nasal Swab     Status: None   Collection Time: 07/14/22 12:59 PM   Specimen: Anterior Nasal Swab  Result Value Ref Range Status   SARS Coronavirus 2 by RT PCR NEGATIVE NEGATIVE Final    Comment: Performed at [facility], [address], [place], [place] [address]    RADIOLOGY STUDIES/RESULTS: CT ABDOMEN PELVIS W  CONTRAST  Result Date: 07/15/2022 CLINICAL DATA:  Shortness of breath weakness EXAM: CT ANGIOGRAPHY CHEST CT ABDOMEN AND PELVIS WITH CONTRAST TECHNIQUE: Multidetector CT imaging of the chest was performed using the standard protocol during bolus administration of intravenous contrast. Multiplanar CT image reconstructions and MIPs were obtained to evaluate the vascular anatomy. Multidetector CT imaging of the abdomen and pelvis was performed using the standard protocol during bolus administration of intravenous contrast. RADIATION DOSE REDUCTION: This exam was performed according to the departmental dose-optimization program which includes automated exposure control, adjustment of the mA and/or kV according to patient size and/or use of iterative reconstruction technique. CONTRAST:  75mL OMNIPAQUE IOHEXOL 350 MG/ML SOLN COMPARISON:  Chest x-ray 07/14/2022, CT 01/20/2019, 4 from 07/18/2020 FINDINGS: CTA CHEST FINDINGS Cardiovascular: Suboptimal opacification of the pulmonary arterial system. No evidence for acute pulmonary embolus to the lobar level. Coronary vascular calcification. Borderline cardiomegaly. No pericardial effusion. Mild atherosclerosis without aneurysm. Mediastinum/Nodes: Midline trachea. No thyroid mass. No suspicious lymph nodes. Esophagus within normal limits Lungs/Pleura: Bilateral bronchial wall thickening. Subsegmental atelectasis in the lower lobes. No pleural effusion or pneumothorax Musculoskeletal: Sternum appears intact. Multiple right chest wall collateral vessels. Suspected right shoulder effusion with small calcified loose bodies. Gynecomastia. Review of the MIP images confirms the above findings. CT ABDOMEN and PELVIS FINDINGS Hepatobiliary: Status post cholecystectomy. No biliary dilatation. Left hepatic lobe cyst without change Pancreas: Unremarkable. No pancreatic ductal dilatation or surrounding inflammatory changes. Spleen: Normal in size without focal abnormality.  Adrenals/Urinary Tract: Adrenal glands are normal. Kidneys show no hydronephrosis. Midpole hypodense renal lesion on the right suspected to represent possible septated cyst, this measures 4.1 by 2.4 cm. The bladder is unremarkable Stomach/Bowel: Status post gastric bypass. No dilated small bowel. No acute bowel wall thickening. Colon diverticula without acute inflammation. Vascular/Lymphatic: Moderate aortic atherosclerosis. No aneurysm. No suspicious lymph nodes Reproductive: Prostate is unremarkable. Other: Negative for pelvic effusion or free air Musculoskeletal: Posterior spinal hardware L1 through L4. Multilevel degenerative change. No acute osseous abnormality. Review of the MIP images confirms the above findings. IMPRESSION: 1. Suboptimal opacification of the pulmonary arterial system. No evidence for acute pulmonary embolus to the lobar level. 2. Bilateral bronchial wall thickening with subsegmental atelectasis at the lower lobes. 3. No CT evidence for acute intra-abdominal or pelvic abnormality. 4. Suspected 4.1  cm complex cyst in the mid right kidney. When the patient is clinically stable and able to follow directions and hold their breath (preferably as an outpatient) further evaluation with dedicated abdominal MRI should be considered. 5. Aortic atherosclerosis. Aortic Atherosclerosis (ICD10-I70.0). Electronically Signed   By: Jasmine Pang M.D.   On: 07/15/2022 01:10   CT Angio Chest Pulmonary Embolism (PE) W or WO Contrast  Result Date: 07/15/2022 CLINICAL DATA:  Shortness of breath weakness EXAM: CT ANGIOGRAPHY CHEST CT ABDOMEN AND PELVIS WITH CONTRAST TECHNIQUE: Multidetector CT imaging of the chest was performed using the standard protocol during bolus administration of intravenous contrast. Multiplanar CT image reconstructions and MIPs were obtained to evaluate the vascular anatomy. Multidetector CT imaging of the abdomen and pelvis was performed using the standard protocol during bolus  administration of intravenous contrast. RADIATION DOSE REDUCTION: This exam was performed according to the departmental dose-optimization program which includes automated exposure control, adjustment of the mA and/or kV according to patient size and/or use of iterative reconstruction technique. CONTRAST:  75mL OMNIPAQUE IOHEXOL 350 MG/ML SOLN COMPARISON:  Chest x-ray 07/14/2022, CT 01/20/2019, 4 from 07/18/2020 FINDINGS: CTA CHEST FINDINGS Cardiovascular: Suboptimal opacification of the pulmonary arterial system. No evidence for acute pulmonary embolus to the lobar level. Coronary vascular calcification. Borderline cardiomegaly. No pericardial effusion. Mild atherosclerosis without aneurysm. Mediastinum/Nodes: Midline trachea. No thyroid mass. No suspicious lymph nodes. Esophagus within normal limits Lungs/Pleura: Bilateral bronchial wall thickening. Subsegmental atelectasis in the lower lobes. No pleural effusion or pneumothorax Musculoskeletal: Sternum appears intact. Multiple right chest wall collateral vessels. Suspected right shoulder effusion with small calcified loose bodies. Gynecomastia. Review of the MIP images confirms the above findings. CT ABDOMEN and PELVIS FINDINGS Hepatobiliary: Status post cholecystectomy. No biliary dilatation. Left hepatic lobe cyst without change Pancreas: Unremarkable. No pancreatic ductal dilatation or surrounding inflammatory changes. Spleen: Normal in size without focal abnormality. Adrenals/Urinary Tract: Adrenal glands are normal. Kidneys show no hydronephrosis. Midpole hypodense renal lesion on the right suspected to represent possible septated cyst, this measures 4.1 by 2.4 cm. The bladder is unremarkable Stomach/Bowel: Status post gastric bypass. No dilated small bowel. No acute bowel wall thickening. Colon diverticula without acute inflammation. Vascular/Lymphatic: Moderate aortic atherosclerosis. No aneurysm. No suspicious lymph nodes Reproductive: Prostate is  unremarkable. Other: Negative for pelvic effusion or free air Musculoskeletal: Posterior spinal hardware L1 through L4. Multilevel degenerative change. No acute osseous abnormality. Review of the MIP images confirms the above findings. IMPRESSION: 1. Suboptimal opacification of the pulmonary arterial system. No evidence for acute pulmonary embolus to the lobar level. 2. Bilateral bronchial wall thickening with subsegmental atelectasis at the lower lobes. 3. No CT evidence for acute intra-abdominal or pelvic abnormality. 4. Suspected 4.1 cm complex cyst in the mid right kidney. When the patient is clinically stable and able to follow directions and hold their breath (preferably as an outpatient) further evaluation with dedicated abdominal MRI should be considered. 5. Aortic atherosclerosis. Aortic Atherosclerosis (ICD10-I70.0). Electronically Signed   By: Jasmine Pang M.D.   On: 07/15/2022 01:10   DG Chest Portable 1 View  Result Date: 07/14/2022 CLINICAL DATA:  Shortness of breath. EXAM: PORTABLE CHEST 1 VIEW COMPARISON:  01/21/2022. FINDINGS: Low lung volumes accentuate the pulmonary vasculature and cardiomediastinal silhouette. No consolidation or pulmonary edema. No pleural effusion or pneumothorax. IMPRESSION: Low lung volumes without evidence of acute cardiopulmonary disease. Electronically Signed   By: Orvan Falconer M.D.   On: 07/14/2022 13:52     LOS: 1 day   Khylah Kendra  Marki Frede, MD  [facility]    To contact the attending provider between 7A-7P or the covering provider during after hours 7P-7A, please log into the web site [email or web address] and access using universal [place] password for that web site. If you do not have the password, please call the hospital operator.  07/15/2022, 10:49 AM

## 2022-07-15 NOTE — Assessment & Plan Note (Signed)
-  chronic avoid nephrotoxic medications such as NSAIDs, Vanco Zosyn combo,  avoid hypotension, continue to follow renal function  

## 2022-07-15 NOTE — Inpatient Diabetes Management (Signed)
Inpatient Diabetes Program Recommendations  AACE/ADA: New Consensus Statement on Inpatient Glycemic Control   Target Ranges:  Prepandial:   less than 140 mg/dL      Peak postprandial:   less than 180 mg/dL (1-2 hours)      Critically ill patients:  140 - 180 mg/dL    Latest Reference Range & Units 07/15/22 03:53 07/15/22 07:53 07/15/22 12:03  Glucose-Capillary 70 - 99 mg/dL 161 (H) 096 (H) 045 (H)    Latest Reference Range & Units 07/14/22 12:58 07/14/22 23:59 07/15/22 02:24  Glucose 70 - 99 mg/dL 409 (H) 811 (H) 914 (H)    Latest Reference Range & Units 06/24/22 10:56 07/14/22 23:59  Hemoglobin A1C 4.8 - 5.6 % 7.6 (H) 7.3 (H)   Review of Glycemic Control  Diabetes history: DM2 Outpatient Diabetes medications: Farxiga 10 mg daily, Metformin 1000 mg BID, Trulicity 0.75 mg Qweek, Humalog 30 units QHS Current orders for Inpatient glycemic control: Novolog 0-9 units Q4H  NOTE: Patient admitted with severe sepsis, UTI, and respiratory failure. Noted consult for diabetes coordinator for "uncontrolled DM". Glucose since arrival has ranged from 161-210 mg/dl and most current N8G 9.5% on 07/14/22 indicating an average glucose of 163 mg/dl. Spoke with patient at bedside regarding DM medications. Patient reports that he sees PCP for DM management. Patient reports that he is taking Metformin 1000 mg BID, Farxiga 10 mg daily, Trulicity 0.75 mg Qweek, and Humalog 30 units QHS (patient confirms he is using Humalog before bedtime).  Patient reports that glucose in upper 100's to low 200's mg/dl usually and he experiences hypoglycemia about 1 time a month. Patient reports that last A1C was 7.6%. Discussed current A1C of 7.3% on 07/14/22 indicating an average glucose of 163 mg/dl over the past 2-3 months. Patient states he does not take his glucometer or glucose log to follow up appointments with PCP. Encouraged patient to take glucose log or glucometer to all appointments so his PCP can review the glucose data  to continue to adjust DM medications if needed. Informed patient that our team will follow along while inpatient and make recommendations if needed. Patient verbalized understanding of informations and has no questions at this time.  Thanks, Orlando Penner, RN, MSN, CDCES Diabetes Coordinator Inpatient Diabetes Program [phone] (Team Pager from 8am to 5pm)

## 2022-07-15 NOTE — Progress Notes (Signed)
Pharmacy Antibiotic Note  Phillip Maldonado is a 72 y.o. male admitted on 07/14/2022 with UTI with concern for sepsis.  Pharmacy has been consulted for Unasyn dosing.  Pt had gone to [facility] ED on 4/12 where a urine cx grew E.faecalis sensitive to ampicillin, nitrofurantoin, and vancomycin; he was prescribed amoxicillin and cefdinir.  Plan: Unasyn 3g IV Q6H.  Height: 5\' 6"  (167.6 cm) Weight: (!) 173 kg (381 lb 6.3 oz) IBW/kg (Calculated) : 63.8  Temp (24hrs), Avg:97.9 F (36.6 C), Min:97.7 F (36.5 C), Max:98.2 F (36.8 C)  Recent Labs  Lab 07/14/22 1258 07/14/22 2359  WBC 10.8* 8.2  CREATININE 1.57*  --   LATICACIDVEN  --  1.9    Estimated Creatinine Clearance: 65.6 mL/min (A) (by C-G formula based on SCr of 1.57 mg/dL (H)).    Allergies  Allergen Reactions   Chlorzoxazone Other (See Comments)    Other reaction(s): Angioedema (ALLERGY/intolerance)   Piroxicam Shortness Of Breath and Swelling    Tongue   Latex Other (See Comments) and Rash    Rash and itching Other reaction(s): Other (See Comments) Burning Feeling   Adhesive [Tape] Other (See Comments)    welts Unknown paper tape ok to use     Thank you for allowing pharmacy to be a part of this patient's care.  Vernard Gambles, PharmD, BCPS  07/15/2022 12:56 AM

## 2022-07-15 NOTE — Assessment & Plan Note (Signed)
Hx of noncompliance w CPAP

## 2022-07-15 NOTE — Progress Notes (Deleted)
   Cardiology Clinic Note   Date: 07/15/2022 ID: VASIL JUHASZ, DOB December 06, 1950, MRN [id number]  Primary Cardiologist:  Reatha Harps, MD  Patient Profile    Phillip Maldonado is a 72 y.o. male who presents to the clinic today for ***  Past medical history significant for: CAD. LHC 01/22/2022 (NSTEMI): Proximal RCA 30%.  OM1 100%.  Distal LAD 30%.  PCI with DES to OM1.  LVEDP 24 mmHg. Chronic diastolic heart failure. Echo 01/22/2022: Technically difficult study.  EF not well-visualized.  Cannot rule out lateral wall hypokinesis.  LV function low normal to normal.  Grade I DD.  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 PVD. Hypertension. Hyperlipidemia. CKD stage III. Nonfatty liver disease. T2DM. History of Roux-en-Y gastric bypass.   History of Present Illness    LANDRUM CARBONELL was first evaluated by Dr. Flora Lipps on 01/22/2022 during hospitalization for NSTEMI.  He underwent PCI with DES to OM1 (as detailed above).  Showed low normal to normal LV function but was a technically difficult study (details above).  Patient was last seen in the office by Juanda Crumble, PA-C on 02/11/2022 for follow-up.  He was doing well at that time and no medication changes were made.   Today, patient ***   ROS: All other systems reviewed and are otherwise negative except as noted in History of Present Illness.  Studies Reviewed    ECG personally reviewed by me today: ***  No significant changes from ***  Risk Assessment/Calculations    {Does this patient have ATRIAL FIBRILLATION?[phone]}          Physical Exam    VS:  There were no vitals taken for this visit. , BMI There is no height or weight on file to calculate BMI.  GEN: Well nourished, well developed, in no acute distress. Neck: No JVD or carotid bruits. Cardiac: *** RRR. No murmurs. No rubs or gallops.   Respiratory:  Respirations regular and unlabored. Clear to auscultation without rales, wheezing or rhonchi. GI:  Soft, nontender, nondistended. Extremities: Radials/DP/PT 2+ and equal bilaterally. No clubbing or cyanosis. No edema ***  Skin: Warm and dry, no rash. Neuro: Strength intact.  Assessment & Plan   ***  Disposition: ***     {Are you ordering a CV Procedure (e.g. stress test, cath, DCCV, TEE, etc)?   Press F2        :[id number]}   Signed, Etta Grandchild. Tonnya Garbett, DNP, NP-C

## 2022-07-15 NOTE — Assessment & Plan Note (Signed)
Will need to follow-up as an outpatient with nutrition 

## 2022-07-15 NOTE — Assessment & Plan Note (Addendum)
Per ER provider recently was diagnosed with Enterococcus faecalis.  Started on Unasyn now as it was sensitive.  Will continue Given prolonged symptoms CT abdomen pelvis ordered to rule out renal abscess Enterococcus faecalis in the 4/12 ED visit with [facility].  It was susceptible to ampicillin

## 2022-07-16 DIAGNOSIS — A419 Sepsis, unspecified organism: Secondary | ICD-10-CM | POA: Diagnosis not present

## 2022-07-16 DIAGNOSIS — I10 Essential (primary) hypertension: Secondary | ICD-10-CM | POA: Diagnosis not present

## 2022-07-16 DIAGNOSIS — G4733 Obstructive sleep apnea (adult) (pediatric): Secondary | ICD-10-CM | POA: Diagnosis not present

## 2022-07-16 DIAGNOSIS — N390 Urinary tract infection, site not specified: Secondary | ICD-10-CM | POA: Diagnosis not present

## 2022-07-16 LAB — CBC WITH DIFFERENTIAL/PLATELET
Abs Immature Granulocytes: 0.03 10*3/uL (ref 0.00–0.07)
Basophils Absolute: 0.1 10*3/uL (ref 0.0–0.1)
Basophils Relative: 1 %
Eosinophils Absolute: 0.4 10*3/uL (ref 0.0–0.5)
Eosinophils Relative: 5 %
HCT: 42.5 % (ref 39.0–52.0)
Hemoglobin: 13.5 g/dL (ref 13.0–17.0)
Immature Granulocytes: 0 %
Lymphocytes Relative: 24 %
Lymphs Abs: 1.6 10*3/uL (ref 0.7–4.0)
MCH: 30.1 pg (ref 26.0–34.0)
MCHC: 31.8 g/dL (ref 30.0–36.0)
MCV: 94.7 fL (ref 80.0–100.0)
Monocytes Absolute: 1.1 10*3/uL — ABNORMAL HIGH (ref 0.1–1.0)
Monocytes Relative: 16 %
Neutro Abs: 3.6 10*3/uL (ref 1.7–7.7)
Neutrophils Relative %: 54 %
Platelets: 151 10*3/uL (ref 150–400)
RBC: 4.49 MIL/uL (ref 4.22–5.81)
RDW: 14.7 % (ref 11.5–15.5)
WBC: 6.7 10*3/uL (ref 4.0–10.5)
nRBC: 0 % (ref 0.0–0.2)

## 2022-07-16 LAB — COMPREHENSIVE METABOLIC PANEL
ALT: 29 U/L (ref 0–44)
AST: 42 U/L — ABNORMAL HIGH (ref 15–41)
Albumin: 2.9 g/dL — ABNORMAL LOW (ref 3.5–5.0)
Alkaline Phosphatase: 40 U/L (ref 38–126)
Anion gap: 12 (ref 5–15)
BUN: 22 mg/dL (ref 8–23)
CO2: 24 mmol/L (ref 22–32)
Calcium: 8.9 mg/dL (ref 8.9–10.3)
Chloride: 101 mmol/L (ref 98–111)
Creatinine, Ser: 1.34 mg/dL — ABNORMAL HIGH (ref 0.61–1.24)
GFR, Estimated: 57 mL/min — ABNORMAL LOW (ref >60)
Glucose, Bld: 186 mg/dL — ABNORMAL HIGH (ref 70–99)
Potassium: 3.6 mmol/L (ref 3.5–5.1)
Sodium: 137 mmol/L (ref 135–145)
Total Bilirubin: 0.9 mg/dL (ref 0.3–1.2)
Total Protein: 6.6 g/dL (ref 6.5–8.1)

## 2022-07-16 LAB — GLUCOSE, CAPILLARY
Glucose-Capillary: 173 mg/dL — ABNORMAL HIGH (ref 70–99)
Glucose-Capillary: 177 mg/dL — ABNORMAL HIGH (ref 70–99)
Glucose-Capillary: 190 mg/dL — ABNORMAL HIGH (ref 70–99)
Glucose-Capillary: 192 mg/dL — ABNORMAL HIGH (ref 70–99)
Glucose-Capillary: 193 mg/dL — ABNORMAL HIGH (ref 70–99)
Glucose-Capillary: 220 mg/dL — ABNORMAL HIGH (ref 70–99)
Glucose-Capillary: 242 mg/dL — ABNORMAL HIGH (ref 70–99)

## 2022-07-16 LAB — URINE CULTURE: Culture: >=100000 — AB

## 2022-07-16 LAB — CULTURE, BLOOD (ROUTINE X 2)

## 2022-07-16 MED ORDER — SPIRONOLACTONE 25 MG PO TABS
25.0000 mg | ORAL_TABLET | Freq: Every day | ORAL | Status: DC
  Administered 2022-07-16 – 2022-07-17 (×2): 25 mg via ORAL
  Filled 2022-07-16 (×2): qty 1

## 2022-07-16 MED ORDER — LEVOFLOXACIN 750 MG PO TABS
750.0000 mg | ORAL_TABLET | Freq: Every day | ORAL | Status: DC
  Administered 2022-07-16: 750 mg via ORAL
  Filled 2022-07-16 (×2): qty 1

## 2022-07-16 MED ORDER — ADULT MULTIVITAMIN W/MINERALS CH
1.0000 | ORAL_TABLET | Freq: Every day | ORAL | Status: DC
  Administered 2022-07-16 – 2022-07-17 (×2): 1 via ORAL
  Filled 2022-07-16 (×2): qty 1

## 2022-07-16 MED ORDER — CARVEDILOL 12.5 MG PO TABS
12.5000 mg | ORAL_TABLET | Freq: Two times a day (BID) | ORAL | Status: DC
  Administered 2022-07-16 (×2): 12.5 mg via ORAL
  Filled 2022-07-16 (×3): qty 1

## 2022-07-16 MED ORDER — TORSEMIDE 20 MG PO TABS
20.0000 mg | ORAL_TABLET | Freq: Every day | ORAL | Status: DC
  Administered 2022-07-16 – 2022-07-17 (×2): 20 mg via ORAL
  Filled 2022-07-16 (×2): qty 1

## 2022-07-16 NOTE — Progress Notes (Signed)
Physical Therapy Treatment Patient Details Name: Phillip Maldonado MRN: [id number] DOB: [birth year] Today's Date: 07/16/2022   History of Present Illness Pt is a 72 year old Maldonado admitted on 07/14/22 with shortness of breath and weakness in the setting of partially treated UTI. + sepsis. PMH include, but not limited to CHF, DM2, HTL, HTN, venous stasis, dermatitis, CKD, OSA, afib, alcohol abuse, CAD, liver disease, back and neck surgery, R shoulder fx.    PT Comments    Pt tolerated today's session well, with great improvement from initial evaluation. Pt able to ambulate in the hallway with minG and RW, performing sit<>stand transfers with minG. Pt reports feeling improved but not to his baseline, but preferring to return home. Pt reports his wife is available to assist at home but is in early stages of dementia. Acute PT will continue to follow up with pt during admission to progress endurance and safety with mobility, recommend return home with HHPT at discharge. Also recommending a rollator, as pt reports his is a few years old and the handles are broken. Will continue to follow as appropriate.     Recommendations for follow up therapy are one component of a multi-disciplinary discharge planning process, led by the attending physician.  Recommendations may be updated based on patient status, additional functional criteria and insurance authorization.  Follow Up Recommendations       Assistance Recommended at Discharge Frequent or constant Supervision/Assistance  Patient can return home with the following A little help with walking and/or transfers;Help with stairs or ramp for entrance   Equipment Recommendations  Rollator (4 wheels)    Recommendations for Other Services       Precautions / Restrictions Precautions Precautions: Fall Restrictions Weight Bearing Restrictions: No     Mobility  Bed Mobility               General bed mobility comments: pt in chair upon arrival     Transfers Overall transfer level: Needs assistance Equipment used: Rolling walker (2 wheels) Transfers: Sit to/from Stand Sit to Stand: Min guard           General transfer comment: Cued for pushing from chair, minG for safety and balance with increased time    Ambulation/Gait Ambulation/Gait assistance: Min guard Gait Distance (Feet): 75 Feet Assistive device: Rolling walker (2 wheels) Gait Pattern/deviations: Step-through pattern, Wide base of support, Trunk flexed, Drifts right/left, Decreased stride length Gait velocity: decreased     General Gait Details: increased trunk flexion with downward gaze, minimal adjustment with cueing, pt reporting his baseline. minG provided throughout for balance and safety, wide BOS with mild lateral sway but no overt LOB   Stairs             Wheelchair Mobility    Modified Rankin (Stroke Patients Only)       Balance Overall balance assessment: Needs assistance Sitting-balance support: No upper extremity supported, Feet supported Sitting balance-Leahy Scale: Good     Standing balance support: Bilateral upper extremity supported, During functional activity, Reliant on assistive device for balance Standing balance-Leahy Scale: Poor Standing balance comment: reliant on RW for support                            Cognition Arousal/Alertness: Awake/alert Behavior During Therapy: Flat affect Overall Cognitive Status: Within Functional Limits for tasks assessed  General Comments: pleasant throughout, mildly slow processing        Exercises      General Comments General comments (skin integrity, edema, etc.): VSS on room air      Pertinent Vitals/Pain Pain Assessment Pain Assessment: No/denies pain    Home Living                          Prior Function            PT Goals (current goals can now be found in the care plan section) Acute Rehab PT  Goals Patient Stated Goal: get better and home to spouse PT Goal Formulation: With patient Time For Goal Achievement: 07/29/22 Potential to Achieve Goals: Good Progress towards PT goals: Progressing toward goals    Frequency    Min 3X/week      PT Plan Discharge plan needs to be updated    Co-evaluation              AM-PAC PT "6 Clicks" Mobility   Outcome Measure  Help needed turning from your back to your side while in a flat bed without using bedrails?: A Little Help needed moving from lying on your back to sitting on the side of a flat bed without using bedrails?: A Little Help needed moving to and from a bed to a chair (including a wheelchair)?: A Little Help needed standing up from a chair using your arms (e.g., wheelchair or bedside chair)?: A Little Help needed to walk in hospital room?: A Little Help needed climbing 3-5 steps with a railing? : A Lot 6 Click Score: 17    End of Session Equipment Utilized During Treatment: Gait belt Activity Tolerance: Patient tolerated treatment well Patient left: in chair;with call bell/phone within reach Nurse Communication: Mobility status PT Visit Diagnosis: Unsteadiness on feet (R26.81);Other abnormalities of gait and mobility (R26.89);Muscle weakness (generalized) (M62.81);Difficulty in walking, not elsewhere classified (R26.2)     Time: 6578-4696 PT Time Calculation (min) (ACUTE ONLY): 15 min  Charges:  $Gait Training: 8-22 mins                     Phillip Maldonado, PT DPT Acute Rehabilitation Services Office [phone]    Phillip Maldonado 07/16/2022, 2:25 PM

## 2022-07-16 NOTE — TOC Progression Note (Signed)
Transition of Care [facility]) - Progression Note    Patient Details  Name: Phillip Maldonado MRN: [id number] Date of Birth: 1951/02/03  [facility]) CM/SW Contact  Gordy Clement, RN Phone Number: 07/16/2022, 3:37 PM  Clinical Narrative:     CM met with Patient bedside to discuss DC recommendations. Home Health PT and OT have been recommended and services were requested from [facility].  Patient had used them previously (2021) Patient has also requested a heavy duty Rollator/  His is broken- Breaks don't work . Christoper Allegra will be delivering one bedside tomorrow, 5/3 . Wife will transport home at DC.   TOC will continue to follow patient for any additional discharge needs      Expected Discharge Plan: Home w Home Health Services Barriers to Discharge: Continued Medical Work up  Expected Discharge Plan and Services In-house Referral: Clinical Social Work Discharge Planning Services: CM Consult Post Acute Care Choice: Home Health, Durable Medical Equipment Living arrangements for the past 2 months: Single Family Home                                       Social Determinants of Health (SDOH) Interventions SDOH Screenings   Food Insecurity: No Food Insecurity (07/14/2022)  Housing: Low Risk  (07/14/2022)  Transportation Needs: No Transportation Needs (07/14/2022)  Utilities: Not At Risk (07/14/2022)  Alcohol Screen: Low Risk  (01/23/2022)  Financial Resource Strain: Low Risk  (01/23/2022)  Tobacco Use: Low Risk  (07/15/2022)    Readmission Risk Interventions     No data to display

## 2022-07-16 NOTE — TOC Initial Note (Signed)
Transition of Care [facility]) - Initial/Assessment Note    Patient Details  Name: Phillip Maldonado MRN: [id number] Date of Birth: 09-Oct-1950  Transition [facility] Care [facility]) CM/SW Contact:    Mearl Latin, LCSW Phone Number: 07/16/2022, 2:52 PM  Clinical Narrative:                 CSW met with patient regarding SNF recommendation. Patient reported that he would like home health services and does not want to go to SNF as he has family there and does not like it. He reported that he feels better today and that he will be able to manage at home. He reported no agency preference. CSW discussed equipment needs and patient requested a new heavy duty rollator as his is broken (the brakes don't work). He has a scooter at home for outside mobility and a lift on their SUV. He relies heavily on his wife for home support, which he appreciates. CSW confirmed PCP and address. Patient states his wife will come pick him up at discharge. Will enlist the help of the RNCM to arrange [facility] and DME. CSW provided supportive listening as patient described life events and his regret that he is not able to walk much. No further questions reported at this time.    Expected Discharge Plan: Home w Home Health Services Barriers to Discharge: Continued Medical Work up   Patient Goals and CMS Choice Patient states their goals for this hospitalization and ongoing recovery are:: Return home CMS Medicare.gov Compare Post Acute Care list provided to:: Patient Choice offered to / list presented to : Patient [place] ownership interest in [facility].provided to:: Patient    Expected Discharge Plan and Services In-house Referral: Clinical Social Work Discharge Planning Services: CM Consult Post Acute Care Choice: Home Health, Durable Medical Equipment Living arrangements for the past 2 months: Single Family Home                                      Prior Living Arrangements/Services Living arrangements for the  past 2 months: Single Family Home Lives with:: Spouse Patient language and need for interpreter reviewed:: Yes Do you feel safe going back to the place where you live?: Yes      Need for Family Participation in Patient Care: No (Comment) Care giver support system in place?: Yes (comment) Current home services: DME (Broken rollator with no brakes, scooter, lift on their SUV) Criminal Activity/Legal Involvement Pertinent to Current Situation/Hospitalization: No - Comment as needed  Activities of Daily Living Home Assistive Devices/Equipment: Other (Comment) (scooter) ADL Screening (condition at time of admission) Patient's cognitive ability adequate to safely complete daily activities?: Yes Is the patient deaf or have difficulty hearing?: No Does the patient have difficulty seeing, even when wearing glasses/contacts?: No Does the patient have difficulty concentrating, remembering, or making decisions?: No Patient able to express need for assistance with ADLs?: Yes Does the patient have difficulty dressing or bathing?: Yes Independently performs ADLs?: No Communication: Independent Dressing (OT): Independent Grooming: Independent Feeding: Independent Bathing: Independent Toileting: Independent In/Out Bed: Needs assistance Is this a change from baseline?: Change from baseline, expected to last <3 days Walks in Home: Needs assistance Is this a change from baseline?: Change from baseline, expected to last <3 days Does the patient have difficulty walking or climbing stairs?: No Weakness of Legs: Both Weakness of Arms/Hands: None  Permission Sought/Granted Permission  sought to share information with : Facility Industrial/product designer granted to share information with : Yes, Verbal Permission Granted     Permission granted to share info w AGENCY: HH        Emotional Assessment Appearance:: Appears stated age Attitude/Demeanor/Rapport: Engaged Affect (typically observed):  Accepting, Appropriate, Pleasant Orientation: : Oriented to Self, Oriented to Place, Oriented to  Time, Oriented to Situation Alcohol / Substance Use: Not Applicable Psych Involvement: No (comment)  Admission diagnosis:  Sepsis (HCC) [A41.9] Urinary tract infection without hematuria, site unspecified [N39.0] Patient Active Problem List   Diagnosis Date Noted   UTI (urinary tract infection) 07/15/2022   Sepsis (HCC) 07/14/2022   NAFLD (nonalcoholic fatty liver disease) 16/12/9602   Acute on chronic combined systolic and diastolic CHF (congestive heart failure) (HCC) 01/25/2022   NSTEMI (non-ST elevated myocardial infarction) (HCC) 01/22/2022   Acute on chronic heart failure with preserved ejection fraction (HFpEF) (HCC) 01/22/2022   NSTEMI (non-ST elevation myocardial infarction) (HCC) 01/22/2022   Bile duct leak    Left knee pain 01/27/2019   Gouty arthritis 01/27/2019   BMI 50.0-59.9, adult (HCC) 01/22/2019   Elevated LFTs 01/22/2019   Elevated amylase and lipase 01/22/2019   Acute pancreatitis 01/22/2019   Septic shock (HCC)    Encounter for central line placement    Abdominal pain 01/20/2019   Malaise 06/10/2016   Dysuria 06/10/2016   CKD (chronic kidney disease) stage 3, GFR 30-59 ml/min (HCC) 01/08/2016   History of recent fall 08/06/2015   Falls frequently 08/06/2015   Class 3 severe obesity with serious comorbidity and body mass index (BMI) of 60.0 to 69.9 in adult (HCC) 07/30/2015   Pain in joint, ankle and foot 07/23/2015   Encounter for long-term methadone use 04/26/2015   Depression, major 04/24/2015   Osteoarthritis of both knees 12/26/2014   Neuropathy, median nerve 09/11/2014   Allergic rhinitis 08/29/2014   Polydipsia 08/29/2014   Hereditary and idiopathic peripheral neuropathy 08/15/2014   Pain in joint, shoulder region 08/15/2014   Edema 06/20/2014   Fundic gland polyps of stomach, benign    Colon cancer screening    Anemia 05/16/2014   Intertrigo  04/11/2014   B12 deficiency anemia 04/11/2014   Rash and nonspecific skin eruption 04/11/2014   Diabetes mellitus (HCC) 12/26/2013   PVD (peripheral vascular disease) (HCC) 12/26/2013   Congestive heart disease (HCC) 12/26/2013   Facet arthropathy, lumbar 09/26/2013   Chronic pain syndrome 09/16/2013   History of marginal ulcers 01/31/2013   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 [place] Otto, [place] - [address] AT [facility] OF [facility] [address] [place] [address] Phone: [phone] Fax: [phone]  [facility] [address] [place] [address] Phone: [phone] Fax: [phone]     Social Determinants of Health (SDOH) Social History: SDOH Screenings   Food Insecurity: No Food Insecurity (07/14/2022)  Housing: Low Risk  (07/14/2022)  Transportation Needs: No Transportation Needs (07/14/2022)  Utilities: Not At Risk (07/14/2022)  Alcohol Screen: Low Risk  (01/23/2022)  Financial Resource Strain: Low Risk  (01/23/2022)  Tobacco Use: Low Risk  (07/15/2022)   SDOH Interventions:     Readmission Risk Interventions     No data  to display

## 2022-07-16 NOTE — Progress Notes (Signed)
Initial Nutrition Assessment  DOCUMENTATION CODES:   Morbid obesity  INTERVENTION:  Multivitamin w/ minerals daily Diet education Encourage good PO intake   NUTRITION DIAGNOSIS:   Increased nutrient needs related to acute illness as evidenced by estimated needs.  GOAL:   Patient will meet greater than or equal to 90% of their needs  MONITOR:   PO intake, Labs, I & O's  REASON FOR ASSESSMENT:   Consult Assessment of nutrition requirement/status  ASSESSMENT:   72 y.o. male presented to the ED with confusion and dysuria. PMH includes T2DM, HTN, HLD, CHF, Roux-en-Y, and CKD III. Pt admitted with sepsis 2/2 UTI and constipation.   Met with pt in room, up in chair. Breakfast in room, but has not yet started to eat. Reports that he does not have the best appetite. Expressed that food here is not hte best. Shares that he recently started a Healthy and Wellness program recommended by his cardiologist to try and lose weight. RD reviewed the Plate Method with him, expressed he is familiar with that. RD to add to discharge instructions. Pt shared that he had his gastric bypass surgery in 1970's and that he had complications immediately after. Shares that he takes a daily Multivitamin w/ minerals. Shared that when he was younger it he would follow-up a high protein, low carb diet; RD recommend not eliminating carbs from diet as they are important for our body.   Medications reviewed and include: Colace, NovoLog SSI, Protonix, Senokot, IV antibiotics  Labs reviewed: Sodium 137, Potassium 3.6, Hgb A1c 7.3%, 24 hr CBGs 177-242  NUTRITION - FOCUSED PHYSICAL EXAM:  Deferred to follow-up.   Diet Order:   Diet Order             Diet Carb Modified Fluid consistency: Thin; Room service appropriate? Yes with Assist  Diet effective now                  EDUCATION NEEDS:   Education needs have been addressed  Skin:  Skin Assessment: Reviewed RN Assessment  Last BM:  5/1  Height:   Ht Readings from Last 1 Encounters:  07/14/22 5\' 6"  (1.676 m)   Weight:  Wt Readings from Last 1 Encounters:  07/14/22 (!) 173 kg   Ideal Body Weight:  64.6 kg  BMI:  Body mass index is 61.56 kg/m.  Estimated Nutritional Needs:  Kcal:  2300-2500 Protein:  115-130 grams Fluid:  >/= 2 L   Kirby Crigler RD, LDN Clinical Dietitian See [facility] for contact information.

## 2022-07-16 NOTE — Progress Notes (Signed)
Pharmacy Antibiotic Note  Phillip Maldonado is a 72 y.o. male admitted on 07/14/2022 with UTI with concern for sepsis.  Pharmacy has been consulted for Unasyn dosing.  Pt had gone to [facility] ED on 4/12 where a urine cx grew E.faecalis sensitive to ampicillin, nitrofurantoin, and vancomycin; he was prescribed amoxicillin and cefdinir.  Urine culture came back with serratia. Due to his CRI, we will use PO levaquin instead for 7d after discussion with Dr. Jerral Ralph.   Plan: Dc unasyn  Levaquin 750mg  PO qday x7  Height: 5\' 6"  (167.6 cm) Weight: (!) 173 kg (381 lb 6.3 oz) IBW/kg (Calculated) : 63.8  Temp (24hrs), Avg:98.6 F (37 C), Min:98.2 F (36.8 C), Max:99.3 F (37.4 C)  Recent Labs  Lab 07/14/22 1258 07/14/22 2359 07/15/22 0224 07/15/22 0551 07/16/22 0538  WBC 10.8* 8.2 8.5  --  6.7  CREATININE 1.57* 1.53* 1.46*  --  1.34*  LATICACIDVEN  --  1.9 2.6* 1.3  --      Estimated Creatinine Clearance: 76.9 mL/min (A) (by C-G formula based on SCr of 1.34 mg/dL (H)).    Allergies  Allergen Reactions   Chlorzoxazone Other (See Comments)    Other reaction(s): Angioedema (ALLERGY/intolerance)   Piroxicam Shortness Of Breath and Swelling    Tongue   Latex Other (See Comments) and Rash    Rash and itching Other reaction(s): Other (See Comments) Burning Feeling   Adhesive [Tape] Other (See Comments)    welts Unknown paper tape ok to use    5/1 blood>>ngtd  4/30 urine>>serratia   4/30 unasyn>> 5/2 5/2 Levaquin>>5/8  Ulyses Southward, PharmD, BCIDP, AAHIVP, CPP Infectious Disease Pharmacist 07/16/2022 2:37 PM

## 2022-07-16 NOTE — Progress Notes (Signed)
PROGRESS NOTE        PATIENT DETAILS Name: Phillip Maldonado Age: 72 y.o. Sex: male Date of Birth: Aug 07, 1950 Admit Date: 07/14/2022 Admitting Physician Therisa Doyne, MD ZOX:[id number], Arvid Right, MD  Brief Summary: Patient is a 72 y.o.  male with history of DM-2, HTN, HLD, CAD-s/p PCI November 2023 chronic HFrEF, OSA-noncompliant with CPAP-recent enterococcal UTI-presented with weakness, fever, dysuria-thought to have sepsis in the setting of partially treated UTI (on Omnicef) and subsequently admitted to the hospitalist service.  Significant events: 4/30>> admit to [facility].  Significant studies: 5/01>> CT abdomen/pelvis: No acute intra-abdominal or pelvic pathology. 5/01>> CT angio chest: No PE  Significant microbiology data: 4/12>> Enterococcus faecalis (pansensitive-done at [facility]) 4/30>> COVID PCR: Negative 5/01>> blood culture: Pending 5/01>> urine culture: gram neg rods  Procedures: None  Consults: None  Subjective: Awake/alert-sitting at bedside chair-feels better-less weakness than on admission.  Less dysuria now.  Objective: Vitals: Blood pressure (!) 140/89, pulse 78, temperature 98.6 F (37 C), temperature source Oral, resp. rate 17, height 5\' 6"  (1.676 m), weight (!) 173 kg, SpO2 95 %.   Exam: Gen Exam:Alert awake-not in any distress HEENT:atraumatic, normocephalic Chest: B/L clear to auscultation anteriorly CVS:S1S2 regular Abdomen:soft non tender, non distended Extremities: Chronic venous stasis dermatitis changes. Neurology: Non focal Skin: no rash  Pertinent Labs/Radiology:    Latest Ref Rng & Units 07/16/2022    5:38 AM 07/15/2022    2:24 AM 07/14/2022   11:59 PM  CBC  WBC 4.0 - 10.5 K/uL 6.7  8.5  8.2   Hemoglobin 13.0 - 17.0 g/dL 54.0  98.1  19.1   Hematocrit 39.0 - 52.0 % 42.5  43.1  43.8   Platelets 150 - 400 K/uL 151  166  153     Lab Results  Component Value Date   NA 137 07/16/2022   K 3.6 07/16/2022    CL 101 07/16/2022   CO2 24 07/16/2022     Assessment/Plan: Severe sepsis (POA) secondary to complicated enterococcal UTI Sepsis physiology improved-afebrile overnight Continue Unasyn Await cultures   Acute hypoxic respiratory failure Required 2-3 L of oxygen on admission-suspect this was due to  atelectasis/mucous plugging Improved-on room air this morning Continue incentive spirometry/bronchodilator/mobilization  HTN BP now creeping up-resume Coreg Resume losartan/amlodipine over the next several days.   DM-2 (A1c 7.3 on 4/30) CBG stable with SSI Resume metformin/Trulicity on discharge.   Recent Labs    07/16/22 0036 07/16/22 0421 07/16/22 0756  GLUCAP 242* 177* 193*     HFrEF Euvolemic Coreg/Demadex/Aldactone to be resumed today as BP better Losartan will be resumed over the next several days.  CAD s/p PCI November 2023 No anginal symptoms-trop not elevated Continue aspirin/Brilinta/statin  CKD stage IIIa Close to baseline  Peripheral neuropathy Stable Continue Lyrica  OSA-noncompliant to CPAP  Generalized weakness/debility/deconditioning Likely due to acute illness PT/OT recommending SNF  4.1 cm complex cyst right kidney Outpatient dedicated MRI recommended by radiology  Morbid Obesity: Estimated body mass index is 61.56 kg/m as calculated from the following:   Height as of this encounter: 5\' 6"  (1.676 m).   Weight as of this encounter: 173 kg.   Code status:   Code Status: Full Code   DVT Prophylaxis: SCDs Start: 07/15/22 0204   Family Communication: Spouse- Vickie-[phone]-updated over the phone 5/2   Disposition Plan: Status is: Inpatient Remains inpatient  appropriate because: Severity of illness   Planned Discharge Destination:SNF   Diet: Diet Order             Diet Carb Modified Fluid consistency: Thin; Room service appropriate? Yes with Assist  Diet effective now                     Antimicrobial  agents: Anti-infectives (From admission, onward)    Start     Dose/Rate Route Frequency Ordered Stop   07/15/22 0400  Ampicillin-Sulbactam (UNASYN) 3 g in sodium chloride 0.9 % 100 mL IVPB        3 g 200 mL/hr over 30 Minutes Intravenous Every 6 hours 07/15/22 0123     07/14/22 2130  Ampicillin-Sulbactam (UNASYN) 3 g in sodium chloride 0.9 % 100 mL IVPB        3 g 200 mL/hr over 30 Minutes Intravenous  Once 07/14/22 2119 07/14/22 2240        MEDICATIONS: Scheduled Meds:  aspirin EC  81 mg Oral Daily   atorvastatin  80 mg Oral QHS   docusate sodium  100 mg Oral BID   guaiFENesin  600 mg Oral BID   insulin aspart  0-9 Units Subcutaneous Q4H   ipratropium-albuterol  3 mL Nebulization BID   pantoprazole  40 mg Oral BID   pregabalin  200 mg Oral TID   senna  1 tablet Oral BID   ticagrelor  90 mg Oral BID   trazodone  300 mg Oral QHS   Continuous Infusions:  ampicillin-sulbactam (UNASYN) IV 3 g (07/16/22 0849)   PRN Meds:.acetaminophen **OR** acetaminophen, albuterol, bisacodyl, HYDROcodone-acetaminophen, ondansetron **OR** ondansetron (ZOFRAN) IV, polyethylene glycol   I have personally reviewed following labs and imaging studies  LABORATORY DATA: CBC: Recent Labs  Lab 07/14/22 1258 07/14/22 2359 07/15/22 0224 07/16/22 0538  WBC 10.8* 8.2 8.5 6.7  NEUTROABS 9.4* 6.9  --  3.6  HGB 14.0 14.5 14.1 13.5  HCT 42.6 43.8 43.1 42.5  MCV 94.5 94.6 94.7 94.7  PLT 164 153 166 151     Basic Metabolic Panel: Recent Labs  Lab 07/14/22 1258 07/14/22 2359 07/15/22 0224 07/16/22 0538  NA 138 137 139 137  K 4.5 5.1 4.1 3.6  CL 100 101 101 101  CO2 22 24 25 24   GLUCOSE 161* 179* 185* 186*  BUN 31* 25* 23 22  CREATININE 1.57* 1.53* 1.46* 1.34*  CALCIUM 9.3 9.0 9.1 8.9  MG  --  2.3 2.4  --   PHOS  --  2.6 2.4*  --      GFR: Estimated Creatinine Clearance: 76.9 mL/min (A) (by C-G formula based on SCr of 1.34 mg/dL (H)).  Liver Function Tests: Recent Labs  Lab  07/14/22 1258 07/14/22 2359 07/15/22 0224 07/16/22 0538  AST 26 49* 33 42*  ALT 20 24 26 29   ALKPHOS 41 42 43 40  BILITOT 1.0 1.9* 1.0 0.9  PROT 6.8 6.9 7.3 6.6  ALBUMIN 3.3* 3.3* 3.3* 2.9*    No results for input(s): "LIPASE", "AMYLASE" in the last 168 hours. No results for input(s): "AMMONIA" in the last 168 hours.  Coagulation Profile: Recent Labs  Lab 07/14/22 2359  INR 1.2     Cardiac Enzymes: Recent Labs  Lab 07/14/22 2359  CKTOTAL 376     BNP (last 3 results) No results for input(s): "PROBNP" in the last 8760 hours.  Lipid Profile: No results for input(s): "CHOL", "HDL", "LDLCALC", "TRIG", "CHOLHDL", "LDLDIRECT" in the last 72  hours.  Thyroid Function Tests: Recent Labs    07/14/22 2359  TSH 0.834     Anemia Panel: No results for input(s): "VITAMINB12", "FOLATE", "FERRITIN", "TIBC", "IRON", "RETICCTPCT" in the last 72 hours.  Urine analysis:    Component Value Date/Time   COLORURINE AMBER (A) 07/14/2022 1720   APPEARANCEUR TURBID (A) 07/14/2022 1720   LABSPEC 1.021 07/14/2022 1720   PHURINE 5.0 07/14/2022 1720   GLUCOSEU >=500 (A) 07/14/2022 1720   HGBUR SMALL (A) 07/14/2022 1720   BILIRUBINUR NEGATIVE 07/14/2022 1720   BILIRUBINUR Large 12/30/2015 1600   KETONESUR NEGATIVE 07/14/2022 1720   PROTEINUR 100 (A) 07/14/2022 1720   UROBILINOGEN 0.2 12/30/2015 1600   UROBILINOGEN 1.0 09/15/2013 1855   NITRITE POSITIVE (A) 07/14/2022 1720   LEUKOCYTESUR LARGE (A) 07/14/2022 1720    Sepsis Labs: Lactic Acid, Venous    Component Value Date/Time   LATICACIDVEN 1.3 07/15/2022 0551    MICROBIOLOGY: Recent Results (from the past 240 hour(s))  SARS Coronavirus 2 by RT PCR (hospital order, performed in [facility] hospital lab) *cepheid single result test* Anterior Nasal Swab     Status: None   Collection Time: 07/14/22 12:59 PM   Specimen: Anterior Nasal Swab  Result Value Ref Range Status   SARS Coronavirus 2 by RT PCR NEGATIVE NEGATIVE Final     Comment: Performed at [facility], [address] [place], [place] [address]  Urine Culture     Status: Abnormal (Preliminary result)   Collection Time: 07/14/22  5:20 PM   Specimen: Urine, Random  Result Value Ref Range Status   Specimen Description URINE, RANDOM  Final   Special Requests NONE Reflexed from [id number]  Final   Culture (A)  Final    >=100,000 COLONIES/mL GRAM NEGATIVE RODS IDENTIFICATION AND SUSCEPTIBILITIES TO FOLLOW Performed at [facility], [address] [place], [place] [address]    Report Status PENDING  Incomplete    RADIOLOGY STUDIES/RESULTS: ECHOCARDIOGRAM COMPLETE  Result Date: 07/15/2022    ECHOCARDIOGRAM REPORT   Patient Name:   SAED HUDLOW Date of Exam: 07/15/2022 Medical Rec #:  [id number]          Height:       66.0 in Accession #:    [id number]         Weight:       381.4 lb Date of Birth:  [birth year]          BSA:          2.631 m Patient Age:    71 years           BP:           140/79 mmHg Patient Gender: M                  HR:           83 bpm. Exam Location:  Inpatient Procedure: 2D Echo, Cardiac Doppler, Color Doppler and Intracardiac            Opacification Agent Indications:    CHF  History:        Patient has prior history of Echocardiogram examinations, most                 recent 01/22/2022. CHF.  Sonographer:    Lucy Antigua Referring Phys: Kenn File DOUTOVA  Sonographer Comments: Patient is obese. Image acquisition challenging due to patient body habitus. IMPRESSIONS  1. Left ventricular ejection fraction, by estimation, is 60 to  65%. The left ventricle has normal function. The left ventricle has no regional wall motion abnormalities. Left ventricular diastolic parameters are indeterminate.  2. Right ventricular systolic function is normal. The right ventricular size is moderately enlarged. Tricuspid regurgitation signal is inadequate for assessing PA pressure.  3. The mitral valve was not well visualized. No evidence of mitral  valve regurgitation.  4. The aortic valve is tricuspid. Aortic valve regurgitation is not visualized. No aortic stenosis is present.  5. Aortic dilatation noted. There is mild dilatation of the aortic root, measuring 41 mm. Comparison(s): Prior images reviewed side by side. LVEF is better visualized from prior study. FINDINGS  Left Ventricle: Left ventricular ejection fraction, by estimation, is 60 to 65%. The left ventricle has normal function. The left ventricle has no regional wall motion abnormalities. The left ventricular internal cavity size was normal in size. Suboptimal image quality limits for assessment of left ventricular hypertrophy. Left ventricular diastolic parameters are indeterminate. Right Ventricle: The right ventricular size is moderately enlarged. No increase in right ventricular wall thickness. Right ventricular systolic function is normal. Tricuspid regurgitation signal is inadequate for assessing PA pressure. Left Atrium: Left atrial size was not well visualized. Right Atrium: Right atrial size was not well visualized. Pericardium: Trivial pericardial effusion is present. The pericardial effusion is surrounding the apex and anterior to the right ventricle. Presence of epicardial fat layer. Mitral Valve: The mitral valve was not well visualized. No evidence of mitral valve regurgitation. Tricuspid Valve: The tricuspid valve is normal in structure. Tricuspid valve regurgitation is not demonstrated. No evidence of tricuspid stenosis. Aortic Valve: The aortic valve is tricuspid. Aortic valve regurgitation is not visualized. No aortic stenosis is present. Aortic valve mean gradient measures 3.0 mmHg. Aortic valve peak gradient measures 6.9 mmHg. Aortic valve area, by VTI measures 4.16 cm. Pulmonic Valve: The pulmonic valve was not well visualized. Pulmonic valve regurgitation is not visualized. No evidence of pulmonic stenosis. Aorta: Aortic dilatation noted. There is mild dilatation of the  aortic root, measuring 41 mm. IAS/Shunts: The interatrial septum was not well visualized.  LEFT VENTRICLE PLAX 2D LVIDd:         5.20 cm   Diastology LV PW:         1.10 cm   LV e' medial:    11.60 cm/s LV IVS:        1.10 cm   LV E/e' medial:  10.0 LVOT diam:     2.40 cm   LV e' lateral:   8.70 cm/s LV SV:         94        LV E/e' lateral: 13.3 LV SV Index:   36 LVOT Area:     4.52 cm  RIGHT VENTRICLE RV S prime:     12.10 cm/s TAPSE (M-mode): 2.6 cm AORTIC VALVE AV Area (Vmax):    3.94 cm AV Area (Vmean):   4.04 cm AV Area (VTI):     4.16 cm AV Vmax:           131.00 cm/s AV Vmean:          79.800 cm/s AV VTI:            0.225 m AV Peak Grad:      6.9 mmHg AV Mean Grad:      3.0 mmHg LVOT Vmax:         114.00 cm/s LVOT Vmean:        71.300 cm/s LVOT VTI:  0.207 m LVOT/AV VTI ratio: 0.92  AORTA Ao Root diam: 4.10 cm MITRAL VALVE MV Area (PHT): 2.82 cm     SHUNTS MV E velocity: 116.00 cm/s  Systemic VTI:  0.21 m MV A velocity: 126.00 cm/s  Systemic Diam: 2.40 cm MV E/A ratio:  0.92 Riley Lam MD Electronically signed by Riley Lam MD Signature Date/Time: 07/15/2022/5:14:10 PM    Final    CT ABDOMEN PELVIS W CONTRAST  Result Date: 07/15/2022 CLINICAL DATA:  Shortness of breath weakness EXAM: CT ANGIOGRAPHY CHEST CT ABDOMEN AND PELVIS WITH CONTRAST TECHNIQUE: Multidetector CT imaging of the chest was performed using the standard protocol during bolus administration of intravenous contrast. Multiplanar CT image reconstructions and MIPs were obtained to evaluate the vascular anatomy. Multidetector CT imaging of the abdomen and pelvis was performed using the standard protocol during bolus administration of intravenous contrast. RADIATION DOSE REDUCTION: This exam was performed according to the departmental dose-optimization program which includes automated exposure control, adjustment of the mA and/or kV according to patient size and/or use of iterative reconstruction technique.  CONTRAST:  75mL OMNIPAQUE IOHEXOL 350 MG/ML SOLN COMPARISON:  Chest x-ray 07/14/2022, CT 01/20/2019, 4 from 07/18/2020 FINDINGS: CTA CHEST FINDINGS Cardiovascular: Suboptimal opacification of the pulmonary arterial system. No evidence for acute pulmonary embolus to the lobar level. Coronary vascular calcification. Borderline cardiomegaly. No pericardial effusion. Mild atherosclerosis without aneurysm. Mediastinum/Nodes: Midline trachea. No thyroid mass. No suspicious lymph nodes. Esophagus within normal limits Lungs/Pleura: Bilateral bronchial wall thickening. Subsegmental atelectasis in the lower lobes. No pleural effusion or pneumothorax Musculoskeletal: Sternum appears intact. Multiple right chest wall collateral vessels. Suspected right shoulder effusion with small calcified loose bodies. Gynecomastia. Review of the MIP images confirms the above findings. CT ABDOMEN and PELVIS FINDINGS Hepatobiliary: Status post cholecystectomy. No biliary dilatation. Left hepatic lobe cyst without change Pancreas: Unremarkable. No pancreatic ductal dilatation or surrounding inflammatory changes. Spleen: Normal in size without focal abnormality. Adrenals/Urinary Tract: Adrenal glands are normal. Kidneys show no hydronephrosis. Midpole hypodense renal lesion on the right suspected to represent possible septated cyst, this measures 4.1 by 2.4 cm. The bladder is unremarkable Stomach/Bowel: Status post gastric bypass. No dilated small bowel. No acute bowel wall thickening. Colon diverticula without acute inflammation. Vascular/Lymphatic: Moderate aortic atherosclerosis. No aneurysm. No suspicious lymph nodes Reproductive: Prostate is unremarkable. Other: Negative for pelvic effusion or free air Musculoskeletal: Posterior spinal hardware L1 through L4. Multilevel degenerative change. No acute osseous abnormality. Review of the MIP images confirms the above findings. IMPRESSION: 1. Suboptimal opacification of the pulmonary arterial  system. No evidence for acute pulmonary embolus to the lobar level. 2. Bilateral bronchial wall thickening with subsegmental atelectasis at the lower lobes. 3. No CT evidence for acute intra-abdominal or pelvic abnormality. 4. Suspected 4.1 cm complex cyst in the mid right kidney. When the patient is clinically stable and able to follow directions and hold their breath (preferably as an outpatient) further evaluation with dedicated abdominal MRI should be considered. 5. Aortic atherosclerosis. Aortic Atherosclerosis (ICD10-I70.0). Electronically Signed   By: Jasmine Pang M.D.   On: 07/15/2022 01:10   CT Angio Chest Pulmonary Embolism (PE) W or WO Contrast  Result Date: 07/15/2022 CLINICAL DATA:  Shortness of breath weakness EXAM: CT ANGIOGRAPHY CHEST CT ABDOMEN AND PELVIS WITH CONTRAST TECHNIQUE: Multidetector CT imaging of the chest was performed using the standard protocol during bolus administration of intravenous contrast. Multiplanar CT image reconstructions and MIPs were obtained to evaluate the vascular anatomy. Multidetector CT imaging of the abdomen and  pelvis was performed using the standard protocol during bolus administration of intravenous contrast. RADIATION DOSE REDUCTION: This exam was performed according to the departmental dose-optimization program which includes automated exposure control, adjustment of the mA and/or kV according to patient size and/or use of iterative reconstruction technique. CONTRAST:  75mL OMNIPAQUE IOHEXOL 350 MG/ML SOLN COMPARISON:  Chest x-ray 07/14/2022, CT 01/20/2019, 4 from 07/18/2020 FINDINGS: CTA CHEST FINDINGS Cardiovascular: Suboptimal opacification of the pulmonary arterial system. No evidence for acute pulmonary embolus to the lobar level. Coronary vascular calcification. Borderline cardiomegaly. No pericardial effusion. Mild atherosclerosis without aneurysm. Mediastinum/Nodes: Midline trachea. No thyroid mass. No suspicious lymph nodes. Esophagus within normal  limits Lungs/Pleura: Bilateral bronchial wall thickening. Subsegmental atelectasis in the lower lobes. No pleural effusion or pneumothorax Musculoskeletal: Sternum appears intact. Multiple right chest wall collateral vessels. Suspected right shoulder effusion with small calcified loose bodies. Gynecomastia. Review of the MIP images confirms the above findings. CT ABDOMEN and PELVIS FINDINGS Hepatobiliary: Status post cholecystectomy. No biliary dilatation. Left hepatic lobe cyst without change Pancreas: Unremarkable. No pancreatic ductal dilatation or surrounding inflammatory changes. Spleen: Normal in size without focal abnormality. Adrenals/Urinary Tract: Adrenal glands are normal. Kidneys show no hydronephrosis. Midpole hypodense renal lesion on the right suspected to represent possible septated cyst, this measures 4.1 by 2.4 cm. The bladder is unremarkable Stomach/Bowel: Status post gastric bypass. No dilated small bowel. No acute bowel wall thickening. Colon diverticula without acute inflammation. Vascular/Lymphatic: Moderate aortic atherosclerosis. No aneurysm. No suspicious lymph nodes Reproductive: Prostate is unremarkable. Other: Negative for pelvic effusion or free air Musculoskeletal: Posterior spinal hardware L1 through L4. Multilevel degenerative change. No acute osseous abnormality. Review of the MIP images confirms the above findings. IMPRESSION: 1. Suboptimal opacification of the pulmonary arterial system. No evidence for acute pulmonary embolus to the lobar level. 2. Bilateral bronchial wall thickening with subsegmental atelectasis at the lower lobes. 3. No CT evidence for acute intra-abdominal or pelvic abnormality. 4. Suspected 4.1 cm complex cyst in the mid right kidney. When the patient is clinically stable and able to follow directions and hold their breath (preferably as an outpatient) further evaluation with dedicated abdominal MRI should be considered. 5. Aortic atherosclerosis. Aortic  Atherosclerosis (ICD10-I70.0). Electronically Signed   By: Jasmine Pang M.D.   On: 07/15/2022 01:10   DG Chest Portable 1 View  Result Date: 07/14/2022 CLINICAL DATA:  Shortness of breath. EXAM: PORTABLE CHEST 1 VIEW COMPARISON:  01/21/2022. FINDINGS: Low lung volumes accentuate the pulmonary vasculature and cardiomediastinal silhouette. No consolidation or pulmonary edema. No pleural effusion or pneumothorax. IMPRESSION: Low lung volumes without evidence of acute cardiopulmonary disease. Electronically Signed   By: Orvan Falconer M.D.   On: 07/14/2022 13:52     LOS: 2 days   Jeoffrey Massed, MD  [facility]    To contact the attending provider between 7A-7P or the covering provider during after hours 7P-7A, please log into the web site [email or web address] and access using universal [place] password for that web site. If you do not have the password, please call the hospital operator.  07/16/2022, 10:16 AM

## 2022-07-17 ENCOUNTER — Other Ambulatory Visit (HOSPITAL_COMMUNITY): Payer: Self-pay

## 2022-07-17 DIAGNOSIS — N1832 Chronic kidney disease, stage 3b: Secondary | ICD-10-CM | POA: Diagnosis not present

## 2022-07-17 DIAGNOSIS — E1122 Type 2 diabetes mellitus with diabetic chronic kidney disease: Secondary | ICD-10-CM | POA: Diagnosis not present

## 2022-07-17 DIAGNOSIS — I5032 Chronic diastolic (congestive) heart failure: Secondary | ICD-10-CM | POA: Diagnosis not present

## 2022-07-17 DIAGNOSIS — N390 Urinary tract infection, site not specified: Secondary | ICD-10-CM | POA: Diagnosis not present

## 2022-07-17 LAB — CBC
HCT: 39.9 % (ref 39.0–52.0)
Hemoglobin: 13 g/dL (ref 13.0–17.0)
MCH: 30.8 pg (ref 26.0–34.0)
MCHC: 32.6 g/dL (ref 30.0–36.0)
MCV: 94.5 fL (ref 80.0–100.0)
Platelets: 159 10*3/uL (ref 150–400)
RBC: 4.22 MIL/uL (ref 4.22–5.81)
RDW: 14.5 % (ref 11.5–15.5)
WBC: 6.1 10*3/uL (ref 4.0–10.5)
nRBC: 0 % (ref 0.0–0.2)

## 2022-07-17 LAB — CULTURE, BLOOD (ROUTINE X 2): Culture: NO GROWTH

## 2022-07-17 LAB — GLUCOSE, CAPILLARY
Glucose-Capillary: 199 mg/dL — ABNORMAL HIGH (ref 70–99)
Glucose-Capillary: 195 mg/dL — ABNORMAL HIGH (ref 70–99)
Glucose-Capillary: 203 mg/dL — ABNORMAL HIGH (ref 70–99)

## 2022-07-17 MED ORDER — TAMSULOSIN HCL 0.4 MG PO CAPS
0.4000 mg | ORAL_CAPSULE | Freq: Every day | ORAL | 1 refills | Status: AC
  Filled 2022-07-17: qty 30, 30d supply, fill #0

## 2022-07-17 MED ORDER — LEVOFLOXACIN 750 MG PO TABS
750.0000 mg | ORAL_TABLET | Freq: Every day | ORAL | 0 refills | Status: AC
  Filled 2022-07-17: qty 12, 12d supply, fill #0

## 2022-07-17 MED ORDER — TAMSULOSIN HCL 0.4 MG PO CAPS
0.4000 mg | ORAL_CAPSULE | Freq: Every day | ORAL | Status: DC
  Administered 2022-07-17: 0.4 mg via ORAL
  Filled 2022-07-17: qty 1

## 2022-07-17 MED ORDER — LEVOFLOXACIN 750 MG PO TABS
750.0000 mg | ORAL_TABLET | Freq: Every day | ORAL | Status: DC
  Administered 2022-07-17: 750 mg via ORAL
  Filled 2022-07-17: qty 1

## 2022-07-17 NOTE — Care Management Important Message (Signed)
Important Message  Patient Details  Name: Phillip Maldonado MRN: [id number] Date of Birth: 01-16-1951   Medicare Important Message Given:  Yes     Dorena Bodo 07/17/2022, 3:06 PM

## 2022-07-17 NOTE — Care Management Important Message (Signed)
Important Message  Patient Details  Name: Phillip Maldonado MRN: [id number] Date of Birth: [birth year]   Medicare Important Message Given:  Yes  Patient left prior to IM delivery will mail copy to the patients home address.    [name] 07/17/2022, 3:07 PM

## 2022-07-17 NOTE — Discharge Summary (Addendum)
PATIENT DETAILS Name: Phillip Maldonado Age: 72 y.o. Sex: male Date of Birth: [birth year] MRN: [id number]. Admitting Physician: Therisa Doyne, MD [id number], Arvid Right, MD  Admit Date: 07/14/2022 Discharge date: 07/17/2022  Recommendations for Outpatient Follow-up:  Follow up with PCP in 1-2 weeks Please obtain CMP/CBC in one week Renal cyst-see below-needs dedicated renal MRI Please ensure outpatient follow-up with urology.  Admitted From:  Home  Disposition: Home health   Discharge Condition: good  CODE STATUS:   Code Status: Full Code   Diet recommendation:  Diet Order             Diet - low sodium heart healthy           Diet Carb Modified           Diet Carb Modified Fluid consistency: Thin; Room service appropriate? Yes with Assist  Diet effective now                    Brief Summary: Patient is a 72 y.o.  male with history of DM-2, HTN, HLD, CAD-s/p PCI November 2023 chronic HFrEF, OSA-noncompliant with CPAP-recent enterococcal UTI-presented with weakness, fever, dysuria-thought to have sepsis in the setting of partially treated UTI ( Omnicef>> switched to amoxicillin for enterococcal UTI) and subsequently admitted to the hospitalist service.   Significant events: 4/30>> admit to [facility].   Significant studies: 5/01>> CT abdomen/pelvis: No acute intra-abdominal or pelvic pathology. 5/01>> CT angio chest: No PE   Significant microbiology data: 4/12>> Enterococcus faecalis (pansensitive-done at [facility]) 4/30>> COVID PCR: Negative 5/01>> blood culture: No growth 5/01>> urine culture: Serratia   Procedures: None   Consults: None  Brief Hospital Course: Severe sepsis (POA) secondary to complicated  UTI Sepsis physiology improved-afebrile overnight Initially on Unasyn for presumed enterococcal UTI (based on recent outpatient cultures done in [facility])-however urine culture grew out Serratia-subsequently switched to levofloxacin Patient does  acknowledge improvement in dysuria-he does not have pelvic pain-however he does acknowledge longstanding history of nocturia (4-5 times), hesitancy-and straining to improve flow.  Suspect that he may have recurrent UTI due to significant BPH.  Plan is to place him on Flomax-spoke with Dr. Jonny Ruiz Wrenn-urology on-call-his office will arrange for outpatient follow-up.    Acute hypoxic respiratory failure Required 2-3 L of oxygen on admission-suspect this was due to  atelectasis/mucous plugging Improved-on room air this morning Continue incentive spirometry/bronchodilator/mobilization   HTN Stable with Coreg/Demadex/Aldactone/Entresto   DM-2 (A1c 7.3 on 4/30) CBG stable with SSI Resume metformin/Trulicity on discharge.  HFrEF Euvolemic Coreg/Demadex/Aldactone/Entresto   CAD s/p PCI November 2023 No anginal symptoms-trop not elevated Continue aspirin/Brilinta/statin   CKD stage IIIa Close to baseline   Peripheral neuropathy Stable Continue Lyrica   OSA-noncompliant to CPAP   Generalized weakness/debility/deconditioning Likely due to acute illness PT/OT initially recommended SNF-but patient improved-and now recommendations are for home health services.   4.1 cm complex cyst right kidney Outpatient dedicated MRI recommended by radiology   Morbid Obesity: Estimated body mass index is 61.56 kg/m as calculated from the following:   Height as of this encounter: 5\' 6"  (1.676 m).   Weight as of this encounter: 173 kg.    Discharge Diagnoses:  Principal Problem:   Sepsis (HCC) Active Problems:   Constipation   HTN (hypertension)   Hypercholesterolemia   OSA (obstructive sleep apnea)   Diabetes mellitus (HCC)   Congestive heart disease (HCC)   Class 3 severe obesity with serious comorbidity and body mass index (BMI) of 60.0 to 69.9  in adult [facility])   CKD (chronic kidney disease) stage 3, GFR 30-59 ml/min (HCC)   UTI (urinary tract infection)   Discharge  Instructions:  Activity:  As tolerated with Full fall precautions use walker/cane & assistance as needed  Discharge Instructions     Diet - low sodium heart healthy   Complete by: As directed    Diet Carb Modified   Complete by: As directed    Discharge instructions   Complete by: As directed    Follow with Primary MD  Saner, Arvid Right, MD in 1-2 weeks  [facility] office will call you with a follow-up appointment.  If you do not hear from then-please give them a call.  Incidental finding-you were found to have a right kidney cyst on CT abdomen-you will require a outpatient MRI-please talk to your primary care doctor or urologist regarding this at your next appointment.  Please get a complete blood count and chemistry panel checked by your Primary MD at your next visit, and again as instructed by your Primary MD.  Get Medicines reviewed and adjusted: Please take all your medications with you for your next visit with your Primary MD  Laboratory/radiological data: Please request your Primary MD to go over all hospital tests and procedure/radiological results at the follow up, please ask your Primary MD to get all Hospital records sent to his/her office.  In some cases, they will be blood work, cultures and biopsy results pending at the time of your discharge. Please request that your primary care M.D. follows up on these results.  Also Note the following: If you experience worsening of your admission symptoms, develop shortness of breath, life threatening emergency, suicidal or homicidal thoughts you must seek medical attention immediately by calling 911 or calling your MD immediately  if symptoms less severe.  You must read complete instructions/literature along with all the possible adverse reactions/side effects for all the Medicines you take and that have been prescribed to you. Take any new Medicines after you have completely understood and accpet all the possible adverse  reactions/side effects.   Do not drive when taking Pain medications or sleeping medications (Benzodaizepines)  Do not take more than prescribed Pain, Sleep and Anxiety Medications. It is not advisable to combine anxiety,sleep and pain medications without talking with your primary care practitioner  Special Instructions: If you have smoked or chewed Tobacco  in the last 2 yrs please stop smoking, stop any regular Alcohol  and or any Recreational drug use.  Wear Seat belts while driving.  Please note: You were cared for by a hospitalist during your hospital stay. Once you are discharged, your primary care physician will handle any further medical issues. Please note that NO REFILLS for any discharge medications will be authorized once you are discharged, as it is imperative that you return to your primary care physician (or establish a relationship with a primary care physician if you do not have one) for your post hospital discharge needs so that they can reassess your need for medications and monitor your lab values.   Increase activity slowly   Complete by: As directed       Allergies as of 07/17/2022       Reactions   Chlorzoxazone Other (See Comments)   Other reaction(s): Angioedema (ALLERGY/intolerance)   Piroxicam Shortness Of Breath, Swelling   Tongue   Latex Other (See Comments), Rash   Rash and itching Other reaction(s): Other (See Comments) Burning Feeling   Adhesive [tape] Other (  See Comments)   welts Unknown paper tape ok to use         Medication List     STOP taking these medications    losartan 50 MG tablet Commonly known as: COZAAR       TAKE these medications    Accu-Chek Aviva Plus test strip Generic drug: glucose blood 1 each by Other route See admin instructions. Check blood sugar twice daily   acetaminophen 500 MG tablet Commonly known as: TYLENOL Take 1,000 mg by mouth in the morning and at bedtime.   amLODipine 10 MG tablet Commonly known  as: NORVASC Take 10 mg by mouth in the morning.   aspirin EC 81 MG tablet Take 1 tablet (81 mg total) by mouth daily. Swallow whole. What changed: when to take this   atorvastatin 80 MG tablet Commonly known as: LIPITOR Take 1 tablet (80 mg total) by mouth at bedtime.   BIOFREEZE EX Apply 1 Application topically as needed (pain.).   carvedilol 12.5 MG tablet Commonly known as: COREG Take 12.5 mg by mouth 2 (two) times daily.   Centrum Silver 50+Men Tabs Take 1 tablet by mouth daily.   cholecalciferol 25 MCG (1000 UNIT) tablet Commonly known as: VITAMIN D3 Take 1,000 Units by mouth daily.   dapagliflozin propanediol 10 MG Tabs tablet Commonly known as: FARXIGA Take 1 tablet (10 mg total) by mouth daily.   docusate sodium 100 MG capsule Commonly known as: COLACE Take 100 mg by mouth 2 (two) times daily as needed for mild constipation.   DULoxetine 60 MG capsule Commonly known as: CYMBALTA Take 1 capsule (60 mg total) by mouth daily.   Entresto 24-26 MG Generic drug: sacubitril-valsartan TAKE 1 TABLET BY MOUTH TWICE DAILY   HumaLOG KwikPen 100 UNIT/ML KwikPen Generic drug: insulin lispro Inject 30 Units into the skin at bedtime.   icosapent Ethyl 1 g capsule Commonly known as: VASCEPA TAKE 2 CAPSULES(2 GRAMS) BY MOUTH TWICE DAILY   levofloxacin 750 MG tablet Commonly known as: LEVAQUIN Take 1 tablet (750 mg total) by mouth daily for 12 days. Start taking on: Jul 18, 2022   Lubricant Eye Drops 0.4-0.3 % Soln Generic drug: Polyethyl Glycol-Propyl Glycol Place 1 drop into both eyes 3 (three) times daily as needed (dry/irritated eyes.).   metFORMIN 1000 MG tablet Commonly known as: GLUCOPHAGE Take 1,000 mg by mouth 2 (two) times daily.   pantoprazole 40 MG tablet Commonly known as: PROTONIX Take 40 mg by mouth 2 (two) times daily.   potassium chloride 10 MEQ tablet Commonly known as: KLOR-CON M TAKE 1 TABLET(10 MEQ) BY MOUTH TWICE DAILY What changed: See  the new instructions.   pregabalin 200 MG capsule Commonly known as: LYRICA Take 200 mg by mouth 3 (three) times daily.   sildenafil 50 MG tablet Commonly known as: VIAGRA TAKE 1 TABLET BY MOUTH AS NEEDED FOR ERECTILE DYSFUNCTION   silver sulfADIAZINE 1 % cream Commonly known as: SILVADENE Apply 1 Application topically 2 (two) times daily as needed (skin irritation). Mix at home 1:1 with triamcinolone and apply 2 times a day to upper back, chest, under breast, groin areas and legs.   spironolactone 25 MG tablet Commonly known as: ALDACTONE Take 1 tablet (25 mg total) by mouth daily.   tamsulosin 0.4 MG Caps capsule Commonly known as: FLOMAX Take 1 capsule (0.4 mg total) by mouth daily.   ticagrelor 90 MG Tabs tablet Commonly known as: BRILINTA Take 1 tablet (90 mg total) by mouth 2 (  two) times daily.   torsemide 10 MG tablet Commonly known as: DEMADEX Take 1 tablet (10 mg total) by mouth 2 (two) times daily. Patient needs to keep upcoming appointment with provider. What changed: how much to take   trazodone 300 MG tablet Commonly known as: DESYREL Take 1 tablet (300 mg total) by mouth at bedtime.   triamcinolone cream 0.1 % Commonly known as: KENALOG Apply 1 Application topically 2 (two) times daily as needed (skin irritation.). Mix at home 1:1 with triamcinolone and apply 2 times a day to upper back, chest, under breast, groin areas and legs.   Trulicity 0.75 MG/0.5ML Sopn Generic drug: Dulaglutide Inject 0.75 mg into the skin once a week. Sunday   vitamin B-12 500 MCG tablet Commonly known as: CYANOCOBALAMIN Take 500 mcg by mouth daily. Unknown dosage               Durable Medical Equipment  (From admission, onward)           Start     Ordered   07/16/22 1513  For home use only DME 4 wheeled rolling walker with seat  Once       Question:  Patient needs a walker to treat with the following condition  Answer:  Septicemia (HCC)   07/16/22 1513             Follow-up Information     Connect with your PCP/Specialist as discussed. Schedule an appointment as soon as possible for a visit .   Contact information: [email or web address] Call our physician referral line at [phone].        Saner, Erin Michelle, MD. Schedule an appointment as soon as possible for a visit in 1 week(s).   Specialty: Family Medicine Contact information: [address] [place] [address] [phone]         Wrenn, John, MD Follow up.   Specialty: Urology Why: Hospital follow up, Office will call with date/time, If you dont hear from them,please give them a call Contact information: [address] [place] [address] [phone]                Allergies  Allergen Reactions   Chlorzoxazone Other (See Comments)    Other reaction(s): Angioedema (ALLERGY/intolerance)   Piroxicam Shortness Of Breath and Swelling    Tongue   Latex Other (See Comments) and Rash    Rash and itching Other reaction(s): Other (See Comments) Burning Feeling   Adhesive [Tape] Other (See Comments)    welts Unknown paper tape ok to use      Other Procedures/Studies: ECHOCARDIOGRAM COMPLETE  Result Date: 07/15/2022    ECHOCARDIOGRAM REPORT   Patient Name:   Phillip Maldonado Date of Exam: 07/15/2022 Medical Rec #:  [id number]          Height:       66.0 in Accession #:    [id number]         Weight:       381.4 lb Date of Birth:  05/30/1950          BSA:          2.631 m Patient Age:    71 years           BP:           14 0/79 mmHg Patient Gender: M                  HR:  83 bpm. Exam Location:  Inpatient Procedure: 2D Echo, Cardiac Doppler, Color Doppler and Intracardiac            Opacification Agent Indications:    CHF  History:        Patient has prior history of Echocardiogram examinations, most                 recent 01/22/2022. CHF.  Sonographer:    Lucy Antigua Referring Phys: Kenn File DOUTOVA  Sonographer  Comments: Patient is obese. Image acquisition challenging due to patient body habitus. IMPRESSIONS  1. Left ventricular ejection fraction, by estimation, is 60 to 65%. The left ventricle has normal function. The left ventricle has no regional wall motion abnormalities. Left ventricular diastolic parameters are indeterminate.  2. Right ventricular systolic function is normal. The right ventricular size is moderately enlarged. Tricuspid regurgitation signal is inadequate for assessing PA pressure.  3. The mitral valve was not well visualized. No evidence of mitral valve regurgitation.  4. The aortic valve is tricuspid. Aortic valve regurgitation is not visualized. No aortic stenosis is present.  5. Aortic dilatation noted. There is mild dilatation of the aortic root, measuring 41 mm. Comparison(s): Prior images reviewed side by side. LVEF is better visualized from prior study. FINDINGS  Left Ventricle: Left ventricular ejection fraction, by estimation, is 60 to 65%. The left ventricle has normal function. The left ventricle has no regional wall motion abnormalities. The left ventricular internal cavity size was normal in size. Suboptimal image quality limits for assessment of left ventricular hypertrophy. Left ventricular diastolic parameters are indeterminate. Right Ventricle: The right ventricular size is moderately enlarged. No increase in right ventricular wall thickness. Right ventricular systolic function is normal. Tricuspid regurgitation signal is inadequate for assessing PA pressure. Left Atrium: Left atrial size was not well visualized. Right Atrium: Right atrial size was not well visualized. Pericardium: Trivial pericardial effusion is present. The pericardial effusion is surrounding the apex and anterior to the right ventricle. Presence of epicardial fat layer. Mitral Valve: The mitral valve was not well visualized. No evidence of mitral valve regurgitation. Tricuspid Valve: The tricuspid valve is normal  in structure. Tricuspid valve regurgitation is not demonstrated. No evidence of tricuspid stenosis. Aortic Valve: The aortic valve is tricuspid. Aortic valve regurgitation is not visualized. No aortic stenosis is present. Aortic valve mean gradient measures 3.0 mmHg. Aortic valve peak gradient measures 6.9 mmHg. Aortic valve area, by VTI measures 4.16 cm. Pulmonic Valve: The pulmonic valve was not well visualized. Pulmonic valve regurgitation is not visualized. No evidence of pulmonic stenosis. Aorta: Aortic dilatation noted. There is mild dilatation of the aortic root, measuring 41 mm. IAS/Shunts: The interatrial septum was not well visualized.  LEFT VENTRICLE PLAX 2D LVIDd:         5.20 cm   Diastology LV PW:         1.10 cm   LV e' medial:    11.60 cm/s LV IVS:        1.10 cm   LV E/e' medial:  10.0 LVOT diam:     2.40 cm   LV e' lateral:   8.70 cm/s LV SV:         94        LV E/e' lateral: 13.3 LV SV Index:   36 LVOT Area:     4.52 cm  RIGHT VENTRICLE RV S prime:     12.10 cm/s TAPSE (M-mode): 2.6 cm AORTIC VALVE AV Area (Vmax):  3.94 cm AV Area (Vmean):   4.04 cm AV Area (VTI):     4.16 cm AV Vmax:           131.00 cm/s AV Vmean:          79.800 cm/s AV VTI:            0.225 m AV Peak Grad:      6.9 mmHg AV Mean Grad:      3.0 mmHg LVOT Vmax:         114.00 cm/s LVOT Vmean:        71.300 cm/s LVOT VTI:          0.207 m LVOT/AV VTI ratio: 0.92  AORTA Ao Root diam: 4.10 cm MITRAL VALVE MV Area (PHT): 2.82 cm     SHUNTS MV E velocity: 116.00 cm/s  Systemic VTI:  0.21 m MV A velocity: 126.00 cm/s  Systemic Diam: 2.40 cm MV E/A ratio:  0.92 Riley Lam MD Electronically signed by Riley Lam MD Signature Date/Time: 07/15/2022/5:14:10 PM    Final    CT ABDOMEN PELVIS W CONTRAST  Result Date: 07/15/2022 CLINICAL DATA:  Shortness of breath weakness EXAM: CT ANGIOGRAPHY CHEST CT ABDOMEN AND PELVIS WITH CONTRAST TECHNIQUE: Multidetector CT imaging of the chest was performed using the standard  protocol during bolus administration of intravenous contrast. Multiplanar CT image reconstructions and MIPs were obtained to evaluate the vascular anatomy. Multidetector CT imaging of the abdomen and pelvis was performed using the standard protocol during bolus administration of intravenous contrast. RADIATION DOSE REDUCTION: This exam was performed according to the departmental dose-optimization program which includes automated exposure control, adjustment of the mA and/or kV according to patient size and/or use of iterative reconstruction technique. CONTRAST:  75mL OMNIPAQUE IOHEXOL 350 MG/ML SOLN COMPARISON:  Chest x-ray 07/14/2022, CT 01/20/2019, 4 from 07/18/2020 FINDINGS: CTA CHEST FINDINGS Cardiovascular: Suboptimal opacification of the pulmonary arterial system. No evidence for acute pulmonary embolus to the lobar level. Coronary vascular calcification. Borderline cardiomegaly. No pericardial effusion. Mild atherosclerosis without aneurysm. Mediastinum/Nodes: Midline trachea. No thyroid mass. No suspicious lymph nodes. Esophagus within normal limits Lungs/Pleura: Bilateral bronchial wall thickening. Subsegmental atelectasis in the lower lobes. No pleural effusion or pneumothorax Musculoskeletal: Sternum appears intact. Multiple right chest wall collateral vessels. Suspected right shoulder effusion with small calcified loose bodies. Gynecomastia. Review of the MIP images confirms the above findings. CT ABDOMEN and PELVIS FINDINGS Hepatobiliary: Status post cholecystectomy. No biliary dilatation. Left hepatic lobe cyst without change Pancreas: Unremarkable. No pancreatic ductal dilatation or surrounding inflammatory changes. Spleen: Normal in size without focal abnormality. Adrenals/Urinary Tract: Adrenal glands are normal. Kidneys show no hydronephrosis. Midpole hypodense renal lesion on the right suspected to represent possible septated cyst, this measures 4.1 by 2.4 cm. The bladder is unremarkable  Stomach/Bowel: Status post gastric bypass. No dilated small bowel. No acute bowel wall thickening. Colon diverticula without acute inflammation. Vascular/Lymphatic: Moderate aortic atherosclerosis. No aneurysm. No suspicious lymph nodes Reproductive: Prostate is unremarkable. Other: Negative for pelvic effusion or free air Musculoskeletal: Posterior spinal hardware L1 through L4. Multilevel degenerative change. No acute osseous abnormality. Review of the MIP images confirms the above findings. IMPRESSION: 1. Suboptimal opacification of the pulmonary arterial system. No evidence for acute pulmonary embolus to the lobar level. 2. Bilateral bronchial wall thickening with subsegmental atelectasis at the lower lobes. 3. No CT evidence for acute intra-abdominal or pelvic abnormality. 4. Suspected 4.1 cm complex cyst in the mid right kidney. When the patient is clinically stable  and able to follow directions and hold their breath (preferably as an outpatient) further evaluation with dedicated abdominal MRI should be considered. 5. Aortic atherosclerosis. Aortic Atherosclerosis (ICD10-I70.0). Electronically Signed   By: Jasmine Pang M.D.   On: 07/15/2022 01:10   CT Angio Chest Pulmonary Embolism (PE) W or WO Contrast  Result Date: 07/15/2022 CLINICAL DATA:  Shortness of breath weakness EXAM: CT ANGIOGRAPHY CHEST CT ABDOMEN AND PELVIS WITH CONTRAST TECHNIQUE: Multidetector CT imaging of the chest was performed using the standard protocol during bolus administration of intravenous contrast. Multiplanar CT image reconstructions and MIPs were obtained to evaluate the vascular anatomy. Multidetector CT imaging of the abdomen and pelvis was performed using the standard protocol during bolus administration of intravenous contrast. RADIATION DOSE REDUCTION: This exam was performed according to the departmental dose-optimization program which includes automated exposure control, adjustment of the mA and/or kV according to  patient size and/or use of iterative reconstruction technique. CONTRAST:  75mL OMNIPAQUE IOHEXOL 350 MG/ML SOLN COMPARISON:  Chest x-ray 07/14/2022, CT 01/20/2019, 4 from 07/18/2020 FINDINGS: CTA CHEST FINDINGS Cardiovascular: Suboptimal opacification of the pulmonary arterial system. No evidence for acute pulmonary embolus to the lobar level. Coronary vascular calcification. Borderline cardiomegaly. No pericardial effusion. Mild atherosclerosis without aneurysm. Mediastinum/Nodes: Midline trachea. No thyroid mass. No suspicious lymph nodes. Esophagus within normal limits Lungs/Pleura: Bilateral bronchial wall thickening. Subsegmental atelectasis in the lower lobes. No pleural effusion or pneumothorax Musculoskeletal: Sternum appears intact. Multiple right chest wall collateral vessels. Suspected right shoulder effusion with small calcified loose bodies. Gynecomastia. Review of the MIP images confirms the above findings. CT ABDOMEN and PELVIS FINDINGS Hepatobiliary: Status post cholecystectomy. No biliary dilatation. Left hepatic lobe cyst without change Pancreas: Unremarkable. No pancreatic ductal dilatation or surrounding inflammatory changes. Spleen: Normal in size without focal abnormality. Adrenals/Urinary Tract: Adrenal glands are normal. Kidneys show no hydronephrosis. Midpole hypodense renal lesion on the right suspected to represent possible septated cyst, this measures 4.1 by 2.4 cm. The bladder is unremarkable Stomach/Bowel: Status post gastric bypass. No dilated small bowel. No acute bowel wall thickening. Colon diverticula without acute inflammation. Vascular/Lymphatic: Moderate aortic atherosclerosis. No aneurysm. No suspicious lymph nodes Reproductive: Prostate is unremarkable. Other: Negative for pelvic effusion or free air Musculoskeletal: Posterior spinal hardware L1 through L4. Multilevel degenerative change. No acute osseous abnormality. Review of the MIP images confirms the above findings.  IMPRESSION: 1. Suboptimal opacification of the pulmonary arterial system. No evidence for acute pulmonary embolus to the lobar level. 2. Bilateral bronchial wall thickening with subsegmental atelectasis at the lower lobes. 3. No CT evidence for acute intra-abdominal or pelvic abnormality. 4. Suspected 4.1 cm complex cyst in the mid right kidney. When the patient is clinically stable and able to follow directions and hold their breath (preferably as an outpatient) further evaluation with dedicated abdominal MRI should be considered. 5. Aortic atherosclerosis. Aortic Atherosclerosis (ICD10-I70.0). Electronically Signed   By: Jasmine Pang M.D.   On: 07/15/2022 01:10   DG Chest Portable 1 View  Result Date: 07/14/2022 CLINICAL DATA:  Shortness of breath. EXAM: PORTABLE CHEST 1 VIEW COMPARISON:  01/21/2022. FINDINGS: Low lung volumes accentuate the pulmonary vasculature and cardiomediastinal silhouette. No consolidation or pulmonary edema. No pleural effusion or pneumothorax. IMPRESSION: Low lung volumes without evidence of acute cardiopulmonary disease. Electronically Signed   By: Orvan Falconer M.D.   On: 07/14/2022 13:52     TODAY-DAY OF DISCHARGE:  Subjective:   Phillip Maldonado today has no headache,no chest abdominal pain,no new weakness tingling or  numbness, feels much better wants to go home today.   Objective:   Blood pressure 125/62, pulse 64, temperature 98.1 F (36.7 C), temperature source Oral, resp. rate 19, height 5\' 6"  (1.676 m), weight (!) 173 kg, SpO2 96 %.  Intake/Output Summary (Last 24 hours) at 07/17/2022 0901 Last data filed at 07/16/2022 1700 Gross per 24 hour  Intake 720 ml  Output --  Net 720 ml   Filed Weights   07/14/22 1247  Weight: (!) 173 kg    Exam: Awake Alert, Oriented *3, No new F.N deficits, Normal affect [place].AT,PERRAL Supple Neck,No JVD, No cervical lymphadenopathy appriciated.  Symmetrical Chest wall movement, Good air movement bilaterally, CTAB RRR,No  Gallops,Rubs or new Murmurs, No Parasternal Heave +ve B.Sounds, Abd Soft, Non tender, No organomegaly appriciated, No rebound -guarding or rigidity. No Cyanosis, Clubbing or edema, No new Rash or bruise   PERTINENT RADIOLOGIC STUDIES: ECHOCARDIOGRAM COMPLETE  Result Date: 07/15/2022    ECHOCARDIOGRAM REPORT   Patient Name:   Phillip Maldonado Date of Exam: 07/15/2022 Medical Rec #:  [id number]          Height:       66.0 in Accession #:    [id number]         Weight:       381.4 lb Date of Birth:  1950-06-08          BSA:          2.631 m Patient Age:    71 years           BP:           140/79 mmHg Patient Gender: M                  HR:           83 bpm. Exam Location:  Inpatient Procedure: 2D Echo, Cardiac Doppler, Color Doppler and Intracardiac            Opacification Agent Indications:    CHF  History:        Patient has prior history of Echocardiogram examinations, most                 recent 01/22/2022. CHF.  Sonographer:    Lucy Antigua Referring Phys: Kenn File DOUTOVA  Sonographer Comments: Patient is obese. Image acquisition challenging due to patient body habitus. IMPRESSIONS  1. Left ventricular ejection fraction, by estimation, is 60 to 65%. The left ventricle has normal function. The left ventricle has no regional wall motion abnormalities. Left ventricular diastolic parameters are indeterminate.  2. Right ventricular systolic function is normal. The right ventricular size is moderately enlarged. Tricuspid regurgitation signal is inadequate for assessing PA pressure.  3. The mitral valve was not well visualized. No evidence of mitral valve regurgitation.  4. The aortic valve is tricuspid. Aortic valve regurgitation is not visualized. No aortic stenosis is present.  5. Aortic dilatation noted. There is mild dilatation of the aortic root, measuring 41 mm. Comparison(s): Prior images reviewed side by side. LVEF is better visualized from prior study. FINDINGS  Left Ventricle: Left ventricular  ejection fraction, by estimation, is 60 to 65%. The left ventricle has normal function. The left ventricle has no regional wall motion abnormalities. The left ventricular internal cavity size was normal in size. Suboptimal image quality limits for assessment of left ventricular hypertrophy. Left ventricular diastolic parameters are indeterminate. Right Ventricle: The right ventricular size is moderately enlarged. No increase in right ventricular wall thickness.  Right ventricular systolic function is normal. Tricuspid regurgitation signal is inadequate for assessing PA pressure. Left Atrium: Left atrial size was not well visualized. Right Atrium: Right atrial size was not well visualized. Pericardium: Trivial pericardial effusion is present. The pericardial effusion is surrounding the apex and anterior to the right ventricle. Presence of epicardial fat layer. Mitral Valve: The mitral valve was not well visualized. No evidence of mitral valve regurgitation. Tricuspid Valve: The tricuspid valve is normal in structure. Tricuspid valve regurgitation is not demonstrated. No evidence of tricuspid stenosis. Aortic Valve: The aortic valve is tricuspid. Aortic valve regurgitation is not visualized. No aortic stenosis is present. Aortic valve mean gradient measures 3.0 mmHg. Aortic valve peak gradient measures 6.9 mmHg. Aortic valve area, by VTI measures 4.16 cm. Pulmonic Valve: The pulmonic valve was not well visualized. Pulmonic valve regurgitation is not visualized. No evidence of pulmonic stenosis. Aorta: Aortic dilatation noted. There is mild dilatation of the aortic root, measuring 41 mm. IAS/Shunts: The interatrial septum was not well visualized.  LEFT VENTRICLE PLAX 2D LVIDd:         5.20 cm   Diastology LV PW:         1.10 cm   LV e' medial:    11.60 cm/s LV IVS:        1.10 cm   LV E/e' medial:  10.0 LVOT diam:     2.40 cm   LV e' lateral:   8.70 cm/s LV SV:         94        LV E/e' lateral: 13.3 LV SV Index:   36  LVOT Area:     4.52 cm  RIGHT VENTRICLE RV S prime:     12.10 cm/s TAPSE (M-mode): 2.6 cm AORTIC VALVE AV Area (Vmax):    3.94 cm AV Area (Vmean):   4.04 cm AV Area (VTI):     4.16 cm AV Vmax:           131.00 cm/s AV Vmean:          79.800 cm/s AV VTI:            0.225 m AV Peak Grad:      6.9 mmHg AV Mean Grad:      3.0 mmHg LVOT Vmax:         114.00 cm/s LVOT Vmean:        71.300 cm/s LVOT VTI:          0.207 m LVOT/AV VTI ratio: 0.92  AORTA Ao Root diam: 4.10 cm MITRAL VALVE MV Area (PHT): 2.82 cm     SHUNTS MV E velocity: 116.00 cm/s  Systemic VTI:  0.21 m MV A velocity: 126.00 cm/s  Systemic Diam: 2.40 cm MV E/A ratio:  0.92 Riley Lam MD Electronically signed by Riley Lam MD Signature Date/Time: 07/15/2022/5:14:10 PM    Final      PERTINENT LAB RESULTS: CBC: Recent Labs    07/16/22 0538 07/17/22 0546  WBC 6.7 6.1  HGB 13.5 13.0  HCT 42.5 39.9  PLT 151 159   CMET CMP     Component Value Date/Time   NA 137 07/16/2022 0538   NA 142 06/24/2022 1056   K 3.6 07/16/2022 0538   CL 101 07/16/2022 0538   CO2 24 07/16/2022 0538   GLUCOSE 186 (H) 07/16/2022 0538   BUN 22 07/16/2022 0538   BUN 25 06/24/2022 1056   CREATININE 1.34 (H) 07/16/2022 0538   CREATININE 1.14 10/01/2016  1504   CALCIUM 8.9 07/16/2022 0538   PROT 6.6 07/16/2022 0538   PROT 7.0 06/24/2022 1056   ALBUMIN 2.9 (L) 07/16/2022 0538   ALBUMIN 4.1 06/24/2022 1056   AST 42 (H) 07/16/2022 0538   ALT 29 07/16/2022 0538   ALKPHOS 40 07/16/2022 0538   BILITOT 0.9 07/16/2022 0538   BILITOT 0.3 06/24/2022 1056   GFRNONAA 57 (L) 07/16/2022 0538   GFRNONAA 67 10/01/2016 1504   GFRAA 53 (L) 02/10/2019 0257   GFRAA 78 10/01/2016 1504    GFR Estimated Creatinine Clearance: 76.9 mL/min (A) (by C-G formula based on SCr of 1.34 mg/dL (H)). No results for input(s): "LIPASE", "AMYLASE" in the last 72 hours. Recent Labs    07/14/22 2359  CKTOTAL 376   Invalid input(s): "POCBNP" No results for  input(s): "DDIMER" in the last 72 hours. Recent Labs    07/14/22 2359  HGBA1C 7.3*   No results for input(s): "CHOL", "HDL", "LDLCALC", "TRIG", "CHOLHDL", "LDLDIRECT" in the last 72 hours. Recent Labs    07/14/22 2359  TSH 0.834   No results for input(s): "VITAMINB12", "FOLATE", "FERRITIN", "TIBC", "IRON", "RETICCTPCT" in the last 72 hours. Coags: Recent Labs    07/14/22 2359  INR 1.2   Microbiology: Recent Results (from the past 240 hour(s))  SARS Coronavirus 2 by RT PCR (hospital order, performed in [facility] hospital lab) *cepheid single result test* Anterior Nasal Swab     Status: None   Collection Time: 07/14/22 12:59 PM   Specimen: Anterior Nasal Swab  Result Value Ref Range Status   SARS Coronavirus 2 by RT PCR NEGATIVE NEGATIVE Final    Comment: Performed at [facility] Lab, [address] [place], [place] [address]  Urine Culture     Status: Abnormal   Collection Time: 07/14/22  5:20 PM   Specimen: Urine, Random  Result Value Ref Range Status   Specimen Description URINE, RANDOM  Final   Special Requests   Final    NONE Reflexed from [id number] Performed at [facility], [address] [place], [place] [address]    Culture >=100,000 COLONIES/mL SERRATIA MARCESCENS (A)  Final   Report Status 07/16/2022 FINAL  Final   Organism ID, Bacteria SERRATIA MARCESCENS (A)  Final      Susceptibility   Serratia marcescens - MIC*    CEFEPIME <=0.12 SENSITIVE Sensitive     CEFTRIAXONE 8 RESISTANT Resistant     CIPROFLOXACIN <=0.25 SENSITIVE Sensitive     GENTAMICIN <=1 SENSITIVE Sensitive     NITROFURANTOIN 256 RESISTANT Resistant     TRIMETH/SULFA <=20 SENSITIVE Sensitive     * >=100,000 COLONIES/mL SERRATIA MARCESCENS  Culture, blood (x 2)     Status: None (Preliminary result)   Collection Time: 07/15/22  2:24 AM   Specimen: BLOOD  Result Value Ref Range Status   Specimen Description BLOOD BLOOD RIGHT ARM  Final   Special Requests   Final    BOTTLES DRAWN  AEROBIC AND ANAEROBIC Blood Culture adequate volume   Culture   Final    NO GROWTH 1 DAY Performed at [facility], [address], [place], [place] [address]    Report Status PENDING  Incomplete  Culture, blood (x 2)     Status: None (Preliminary result)   Collection Time: 07/15/22  2:24 AM   Specimen: BLOOD  Result Value Ref Range Status   Specimen Description BLOOD BLOOD RIGHT HAND  Final   Special Requests   Final  BOTTLES DRAWN AEROBIC AND ANAEROBIC Blood Culture adequate volume   Culture   Final    NO GROWTH 1 DAY Performed at [facility], [address], [place], [place] [address]    Report Status PENDING  Incomplete    FURTHER DISCHARGE INSTRUCTIONS:  Get Medicines reviewed and adjusted: Please take all your medications with you for your next visit with your Primary MD  Laboratory/radiological data: Please request your Primary MD to go over all hospital tests and procedure/radiological results at the follow up, please ask your Primary MD to get all Hospital records sent to his/her office.  In some cases, they will be blood work, cultures and biopsy results pending at the time of your discharge. Please request that your primary care M.D. goes through all the records of your hospital data and follows up on these results.  Also Note the following: If you experience worsening of your admission symptoms, develop shortness of breath, life threatening emergency, suicidal or homicidal thoughts you must seek medical attention immediately by calling 911 or calling your MD immediately  if symptoms less severe.  You must read complete instructions/literature along with all the possible adverse reactions/side effects for all the Medicines you take and that have been prescribed to you. Take any new Medicines after you have completely understood and accpet all the possible adverse reactions/side effects.   Do not drive when taking Pain medications or sleeping medications  (Benzodaizepines)  Do not take more than prescribed Pain, Sleep and Anxiety Medications. It is not advisable to combine anxiety,sleep and pain medications without talking with your primary care practitioner  Special Instructions: If you have smoked or chewed Tobacco  in the last 2 yrs please stop smoking, stop any regular Alcohol  and or any Recreational drug use.  Wear Seat belts while driving.  Please note: You were cared for by a hospitalist during your hospital stay. Once you are discharged, your primary care physician will handle any further medical issues. Please note that NO REFILLS for any discharge medications will be authorized once you are discharged, as it is imperative that you return to your primary care physician (or establish a relationship with a primary care physician if you do not have one) for your post hospital discharge needs so that they can reassess your need for medications and monitor your lab values.  Total Time spent coordinating discharge including counseling, education and face to face time equals greater than 30 minutes.  SignedJeoffrey Massed 07/17/2022 9:01 AM

## 2022-07-17 NOTE — TOC Transition Note (Signed)
Transition of Care [facility]) - CM/SW Discharge Note   Patient Details  Name: Phillip Maldonado MRN: [id number] Date of Birth: Sep 17, 1950  [facility] CM/SW Contact:  Gordy Clement, RN Phone Number: 07/17/2022, 9:09 AM   Clinical Narrative:    Patient to dc to home today with Wife.  Home Health has been recommended and [facility] will provide HH PT and OT.  A heavy duty Rollator has been requested and will be delivered bedside prior to DC. No additional TOC Needs.           Barriers to Discharge: Continued Medical Work up   Patient Goals and CMS Choice CMS Medicare.gov Compare Post Acute Care list provided to:: Patient Choice offered to / list presented to : Patient  Discharge Placement                         Discharge Plan and Services Additional resources added to the After Visit Summary for   In-house Referral: Clinical Social Work Discharge Planning Services: CM Consult Post Acute Care Choice: Home Health, Durable Medical Equipment                               Social Determinants of Health (SDOH) Interventions SDOH Screenings   Food Insecurity: No Food Insecurity (07/14/2022)  Housing: Low Risk  (07/14/2022)  Transportation Needs: No Transportation Needs (07/14/2022)  Utilities: Not At Risk (07/14/2022)  Alcohol Screen: Low Risk  (01/23/2022)  Financial Resource Strain: Low Risk  (01/23/2022)  Tobacco Use: Low Risk  (07/15/2022)     Readmission Risk Interventions     No data to display

## 2022-07-18 LAB — CULTURE, BLOOD (ROUTINE X 2): Special Requests: ADEQUATE

## 2022-07-19 LAB — CULTURE, BLOOD (ROUTINE X 2)

## 2022-07-20 LAB — CULTURE, BLOOD (ROUTINE X 2)
Culture: NO GROWTH
Special Requests: ADEQUATE

## 2022-07-22 ENCOUNTER — Other Ambulatory Visit: Payer: Self-pay | Admitting: Cardiovascular Disease

## 2022-07-27 ENCOUNTER — Other Ambulatory Visit: Payer: Self-pay | Admitting: Adult Health

## 2022-07-28 ENCOUNTER — Telehealth: Payer: Self-pay | Admitting: Cardiovascular Disease

## 2022-07-28 NOTE — Telephone Encounter (Signed)
Patient states he was taking toresmide 10mg  tablet 5 tablets BID for a total of 100mg  daily.  He states Juanda Crumble sent Rx in April with instructions for 10mg  once daily. He stated he didn't realize she changed the order and was taking 5 tablets BID like he usually does. He wanted to know why was the order changed? Also he is out of medications. A new Rx was sent today but they are stating its too early for refill. He has swelling in his legs and feet. Denies chest pain or discomfort, shortness of breath, nausea or vomiting, sweating or headache. He would like to know what is the dosage he should be taken for toresmide. He also states 10mg  daily is not enough. And can we send in new Rx with increase dosage.

## 2022-07-28 NOTE — Telephone Encounter (Signed)
Pt c/o medication issue:  1. Name of Medication:   torsemide (DEMADEX) 10 MG tablet    2. How are you currently taking this medication (dosage and times per day)?   Not sure   3. Are you having a reaction (difficulty breathing--STAT)? No   4. What is your medication issue? Pt states  he was previously on this med 5 tablets by mouth twice daily. He went to the hospital and they changed it to 1 tablet by mouth twice daily. Pt is unsure how he should be taking because his legs are still swelling. Pt is also almost out of medication and will need refill. Please advise.

## 2022-07-29 ENCOUNTER — Telehealth: Payer: Self-pay | Admitting: Cardiovascular Disease

## 2022-07-29 ENCOUNTER — Other Ambulatory Visit: Payer: Self-pay | Admitting: Cardiovascular Disease

## 2022-07-29 MED ORDER — TORSEMIDE 10 MG PO TABS: ORAL_TABLET | ORAL | 1 refills | Status: DC

## 2022-07-29 NOTE — Telephone Encounter (Signed)
Matter addressed in another phone note. See chart.

## 2022-07-29 NOTE — Telephone Encounter (Signed)
Pt states he has been taking 5 10 mg tablets twice daily. He did not realize the prescription was changed to 10 mg twice daily. Pt states he has been without the medication since Sunday. He states "I have very little output and the swelling is really bad." Reached out to Dr. Flora Lipps see below:  OK for 50 mg BID.   Gerri Spore T. Flora Lipps, MD, [facility]  [facility]  [address]  [place], [place] [address]  [phone]  5:07 PM   Prescription sent to pharmacy. Appt made for pt next Wednesday. Pt very thankful for the corrected prescription and appt.

## 2022-07-29 NOTE — Telephone Encounter (Signed)
Pt c/o medication issue:  1. Name of Medication: torsemide (DEMADEX) 10 MG tablet   2. How are you currently taking this medication (dosage and times per day)?   TAKE 1 TABLET(10 MG) BY MOUTH TWICE DAILY    3. Are you having a reaction (difficulty breathing--STAT)? no  4. What is your medication issue? Patient states that that he was taking 5 tablets twice a day. He didn't realize that his prescription has change to 1 table twice a day. So that prescription that was sent on yesterday he can't get because they are saying to early. But he states he doesn't have any medication left. And his lefts are starting to swell. Please advise

## 2022-08-02 NOTE — Progress Notes (Unsigned)
Cardiology Office Note:   Date:  08/05/2022  NAME:  Phillip Maldonado    MRN: [id number] DOB:  1950/04/24   PCP:  Amil Amen, MD  Cardiologist:  Reatha Harps, MD  Electrophysiologist:  None   Referring MD: Amil Amen, MD   Chief Complaint  Patient presents with   Follow-up        History of Present Illness:   Phillip Maldonado is a 72 y.o. male with below history who presents for follow-up.  He reports he overall is doing well.  Has no extra edema on examination.  Still getting short of breath with activity.  Weight remains 390 pounds.  He tells me he is trying to lose weight.  He informed me that he eats very little.  He is unclear why he cannot lose weight.  No symptoms of angina.  Remains on aspirin and ticagrelor.  On torsemide 50 mg twice daily.  He tells me he eats at home and does not eat fast foods or foods high in calories.  He wishes referral to a different weight loss clinic.  Lipids were not at goal.  Blood pressure improved.  Working on his diabetes.  Problem List NSTEMI -PCI x 1 to OM 01/22/2022 2. HLD -T chol 210, HDL 39, LDL 111, TG 350 3. HFpEF 4. HTN 5. Obesity  6. DM -A1c 8.4  Past Medical History: 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   Edema extremities    bilateral lower extremities-"weepy areas due to fluid retention"   Edema of both lower extremities    Fatty liver     Fundic gland polyps of stomach, benign    Headache(784.0)    Hearing loss    Hepatitis B    History of alcohol abuse    last drink in 1993   Hx of blood clots    Hyperlipemia    Hypertension    Joint pain    Myocardial infarct (HCC)    Neuromuscular disorder (HCC)    Neuropathy, 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 06-04-14 "doesn't use much now, no mask/tubing now.   Stomach ulcer     Past Surgical History: Past Surgical History:  Procedure Laterality Date   ABDOMINAL SURGERY     [phone]   BACK SURGERY     x 8 -,multiple fusions(cervical to lumbar)   BILIARY STENT PLACEMENT  02/07/2019   Procedure: BILIARY STENT PLACEMENT;  Surgeon: Meryl Dare,  MD;  Location: [facility];  Service: Endoscopy;;   CHOLECYSTECTOMY N/A 02/02/2019   Procedure: attempted LAPAROSCOPIC CHOLECYSTECTOMY;  Surgeon: Abigail Miyamoto, MD;  Location: [facility] OR;  Service: General;  Laterality: N/A;   CHOLECYSTECTOMY  02/02/2019   Procedure: Cholecystectomy;  Surgeon: Abigail Miyamoto, MD;  Location: [facility] OR;  Service: General;;   COLONOSCOPY     COLONOSCOPY WITH PROPOFOL N/A 06/14/2014   Procedure: COLONOSCOPY WITH PROPOFOL;  Surgeon: Iva Boop, MD;  Location: [facility] ENDOSCOPY;  Service: Endoscopy;  Laterality: N/A;   CORONARY STENT INTERVENTION N/A 01/22/2022   Procedure: CORONARY STENT INTERVENTION;  Surgeon: Kathleene Hazel, MD;  Location: [facility] INVASIVE CV LAB;  Service: Cardiovascular;  Laterality: N/A;   DIAGNOSTIC LAPAROSCOPY     ERCP N/A 02/07/2019   Procedure: ENDOSCOPIC RETROGRADE CHOLANGIOPANCREATOGRAPHY (ERCP);  Surgeon: Meryl Dare, MD;  Location: [facility] ENDOSCOPY;  Service: Endoscopy;  Laterality: N/A;   ERCP N/A  04/07/2019   Procedure: ENDOSCOPIC RETROGRADE CHOLANGIOPANCREATOGRAPHY (ERCP);  Surgeon: Iva Boop, MD;  Location: [facility] ENDOSCOPY;  Service: Endoscopy;  Laterality: N/A;   ESOPHAGOGASTRODUODENOSCOPY N/A 01/03/2013   Procedure: ESOPHAGOGASTRODUODENOSCOPY (EGD);  Surgeon: Willis Modena, MD;  Location: [facility] ORS;  Service: Endoscopy;  Laterality: N/A;   ESOPHAGOGASTRODUODENOSCOPY N/A 01/03/2013   Procedure: ESOPHAGOGASTRODUODENOSCOPY (EGD);  Surgeon: Willis Modena, MD;  Location: [facility] ENDOSCOPY;  Service: Endoscopy;  Laterality: N/A;   ESOPHAGOGASTRODUODENOSCOPY N/A 01/23/2013   Procedure: ESOPHAGOGASTRODUODENOSCOPY (EGD);  Surgeon: Shirley Friar, MD;  Location: [facility] ENDOSCOPY;  Service: Endoscopy;  Laterality: N/A;   ESOPHAGOGASTRODUODENOSCOPY Left 01/27/2013   Procedure: ESOPHAGOGASTRODUODENOSCOPY (EGD);  Surgeon: Shirley Friar, MD;  Location: [facility] OR;  Service: Endoscopy;  Laterality: Left;   ESOPHAGOGASTRODUODENOSCOPY N/A 06/14/2014   Procedure: ESOPHAGOGASTRODUODENOSCOPY (EGD);  Surgeon: Iva Boop, MD;  Location: [facility] ENDOSCOPY;  Service: Endoscopy;  Laterality: N/A;   ESOPHAGOSCOPY N/A 01/01/2013   Procedure: ESOPHAGOSCOPY;  Surgeon: Petra Kuba, MD;  Location: [facility] OR;  Service: Endoscopy;  Laterality: N/A;   GASTRIC BYPASS  1977   Reversed in 1992 and revision 1994   GASTROINTESTINAL STENT REMOVAL N/A 04/07/2019   Procedure: GASTROINTESTINAL STENT REMOVAL;  Surgeon: Iva Boop, MD;  Location: [facility] ENDOSCOPY;  Service: Endoscopy;  Laterality: N/A;   HYDROCELE EXCISION  1996   x 2    KNEE SURGERY Left 2004   LEFT HEART CATH AND CORONARY ANGIOGRAPHY N/A 01/22/2022   Procedure: LEFT HEART CATH AND CORONARY ANGIOGRAPHY;  Surgeon: Kathleene Hazel, MD;  Location: [facility]VAS[facility] CV LAB;  Service: Cardiovascular;  Laterality: N/A;    Current Medications: Current Meds  Medication Sig   acetaminophen (TYLENOL) 500 MG tablet Take 1,000 mg by mouth in the morning and at bedtime.    aspirin EC 81 MG tablet Take 1 tablet (81 mg total) by mouth daily. Swallow whole. (Patient taking differently: Take 81 mg by mouth in the morning and at bedtime. Swallow whole.)   cholecalciferol (VITAMIN D3) 25 MCG (1000 UNIT) tablet Take 1,000 Units by mouth daily.   dapagliflozin propanediol (FARXIGA) 10 MG TABS tablet Take 1 tablet (10 mg total) by mouth daily.   docusate sodium (COLACE) 100 MG capsule Take 100 mg by mouth 2 (two) times daily as needed for mild constipation.   DULoxetine (CYMBALTA) 60 MG capsule Take 1 capsule (60 mg total) by mouth daily.   glucose blood (ACCU-CHEK AVIVA PLUS) test strip 1 each by Other route See admin instructions. Check blood sugar twice daily   HUMALOG KWIKPEN 100 UNIT/ML KwikPen Inject 30 Units  into the skin at bedtime.   metFORMIN (GLUCOPHAGE) 1000 MG tablet Take 1,000 mg by mouth 2 (two) times daily.   Multiple Vitamins-Minerals (CENTRUM SILVER 50+MEN) TABS Take 1 tablet by mouth daily.   pantoprazole (PROTONIX) 40 MG tablet Take 40 mg by mouth 2 (two) times daily.   Polyethyl Glycol-Propyl Glycol (LUBRICANT EYE DROPS) 0.4-0.3 % SOLN Place 1 drop into both eyes 3 (three) times daily as needed (dry/irritated eyes.).   potassium chloride (KLOR-CON M) 10 MEQ tablet TAKE 1 TABLET(10 MEQ) BY MOUTH TWICE DAILY   pregabalin (LYRICA) 200 MG capsule Take 200 mg by mouth 3 (three) times daily.   sildenafil (VIAGRA) 50 MG tablet TAKE 1 TABLET BY MOUTH AS NEEDED FOR ERECTILE DYSFUNCTION   silver sulfADIAZINE (SILVADENE) 1 % cream Apply 1 Application topically 2 (two) times daily as needed (skin irritation). Mix at home 1:1 with triamcinolone and apply 2 times a day to upper back, chest, under breast, groin areas and legs.   tamsulosin (FLOMAX) 0.4 MG CAPS capsule Take 1 capsule (0.4 mg total) by mouth daily.   torsemide (DEMADEX) 10 MG tablet Take 50 mg (5 tablets) twice daily   traZODone (DESYREL) 300 MG tablet Take 1 tablet (300 mg total) by mouth at bedtime.    triamcinolone cream (KENALOG) 0.1 % Apply 1 Application topically 2 (two) times daily as needed (skin irritation.). Mix at home 1:1 with triamcinolone and apply 2 times a day to upper back, chest, under breast, groin areas and legs.   TRULICITY 0.75 MG/0.5ML SOPN Inject 0.75 mg into the skin once a week. Sunday   vitamin B-12 (CYANOCOBALAMIN) 500 MCG tablet Take 500 mcg by mouth daily. Unknown dosage   [DISCONTINUED] amLODipine (NORVASC) 10 MG tablet Take 10 mg by mouth in the morning.   [DISCONTINUED] atorvastatin (LIPITOR) 80 MG tablet Take 1 tablet (80 mg total) by mouth at bedtime.   [DISCONTINUED] carvedilol (COREG) 12.5 MG tablet Take 12.5 mg by mouth 2 (two) times daily.   [DISCONTINUED] ENTRESTO 24-26 MG TAKE 1 TABLET BY MOUTH TWICE DAILY   [DISCONTINUED] icosapent Ethyl (VASCEPA) 1 g capsule TAKE 2 CAPSULES(2 GRAMS) BY MOUTH TWICE DAILY   [DISCONTINUED] Menthol, Topical Analgesic, (BIOFREEZE EX) Apply 1 Application topically as needed (pain.).   [DISCONTINUED] spironolactone (ALDACTONE) 25 MG tablet Take 1 tablet (25 mg total) by mouth daily.   [DISCONTINUED] ticagrelor (BRILINTA) 90 MG TABS tablet Take 1 tablet (90 mg total) by mouth 2 (two) times daily.     Allergies:    Chlorzoxazone, Piroxicam, Latex, and Adhesive [tape]   Social History: Social History   Socioeconomic History   Marital status: Married    Spouse name: Chip Boer   Number of children: 6   Years of education: Not on file   Highest education level: Master's degree (e.g., MA, MS, MEng, MEd, MSW, MBA)  Occupational History   Occupation: DISABLED   Occupation: Retired  Tobacco Use   Smoking status: Never   Smokeless tobacco: Never  Vaping Use   Vaping Use: Never used  Substance and Sexual Activity   Alcohol use: No    Alcohol/week: 0.0 standard drinks of alcohol   Drug use: No   Sexual activity: Not Currently  Other Topics Concern   Not on file  Social History Narrative   Patient is divorced, he has a total  of 6 children 2 of which are deceased. He is a retired Scientist, clinical (histocompatibility and immunogenetics). History of alcoholism. No alcohol now. 2 caffeinated beverages daily.  Married for 3rd time   Social Determinants of Health   Financial Resource Strain: Low Risk  (01/23/2022)   Overall Financial Resource Strain (CARDIA)    Difficulty of Paying Living Expenses: Not hard at all  Food Insecurity: No Food Insecurity (07/14/2022)   Hunger Vital Sign    Worried About Running Out of Food in the Last Year: Never true    Ran Out of Food in the Last Year: Never true  Transportation Needs: No Transportation Needs (07/14/2022)   PRAPARE - Administrator, Civil Service (Medical): No    Lack of Transportation (Non-Medical): No  Physical Activity: Not on file  Stress: Not on file  Social Connections: Not on file     Family History: The patient's family history includes Anxiety disorder in his mother; Arthritis in his mother, sister, and sister; COPD in his father; Cancer in his maternal grandmother; Depression in his mother, son, and son; Diabetes in his daughter and mother; Eating disorder in his mother; Heart disease in his father, maternal uncle, maternal uncle, and paternal uncle; High Cholesterol in his father and mother; High blood pressure in his mother, sister, sister, and son; Hypertension in his father; Osteoporosis in his mother; Skin cancer in his father; Sleep apnea in his mother; Stroke in his father.  ROS:   All other ROS reviewed and negative. Pertinent positives noted in the HPI.     EKGs/Labs/Other Studies Reviewed:   The following studies were personally reviewed by me today:  TTE 07/15/2022  1. Left ventricular ejection fraction, by estimation, is 60 to 65%. The  left ventricle has normal function. The left ventricle has no regional  wall motion abnormalities. Left ventricular diastolic parameters are  indeterminate.   2. Right ventricular systolic function is normal. The right ventricular   size is moderately enlarged. Tricuspid regurgitation signal is inadequate  for assessing PA pressure.   3. The mitral valve was not well visualized. No evidence of mitral valve  regurgitation.   4. The aortic valve is tricuspid. Aortic valve regurgitation is not  visualized. No aortic stenosis is present.   5. Aortic dilatation noted. There is mild dilatation of the aortic root,  measuring 41 mm.   LHC 01/22/2022   Prox RCA lesion is 30% stenosed.   1st Mrg lesion is 100% stenosed.   Dist LAD lesion is 30% stenosed.   A drug-eluting stent was successfully placed using a SYNERGY XD 3.0X16.   Post intervention, there is a 0% residual stenosis.   Mild non-obstructive disease in the mid and distal LAD Large Circumflex with occluded large first obtuse marginal branch.  Successful PTCA/DES x 1 obtuse marginal branch Large dominant RCA with mild mid vessel stenosis LVEDP=24 mmHg  Recent Labs: 07/14/2022: B Natriuretic Peptide 74.8; TSH 0.834 07/15/2022: Magnesium 2.4 07/16/2022: ALT 29; BUN 22; Creatinine, Ser 1.34; Potassium 3.6; Sodium 137 07/17/2022: Hemoglobin 13.0; Platelets 159   Recent Lipid Panel    Component Value Date/Time   CHOL 210 (H) 06/24/2022 1056   TRIG 350 (H) 06/24/2022 1056   HDL 39 (L) 06/24/2022 1056   CHOLHDL 3.5 01/22/2022 0030   VLDL 67 (H) 01/22/2022 0030   LDLCALC 111 (H) 06/24/2022 1056    Physical Exam:   VS:  BP 124/76   Pulse 80   Ht 5\' 6"  (1.676 m)   Wt (!) 391 lb 3.2 oz (177.4 kg)   SpO2 93%   BMI 63.14 kg/m    Wt Readings from Last 3  Encounters:  08/05/22 (!) 391 lb 3.2 oz (177.4 kg)  07/14/22 (!) 381 lb 6.3 oz (173 kg)  06/24/22 (!) 382 lb (173.3 kg)    General: Well nourished, well developed, in no acute distress Head: Atraumatic, normal size  Eyes: PEERLA, EOMI  Neck: Supple, no JVD Endocrine: No thryomegaly Cardiac: Normal S1, S2; RRR; no murmurs, rubs, or gallops Lungs: Clear to auscultation bilaterally, no wheezing, rhonchi or rales   Abd: Soft, nontender, no hepatomegaly  Ext: No edema, pulses 2+ Musculoskeletal: No deformities, BUE and BLE strength normal and equal Skin: Warm and dry, no rashes   Neuro: Alert and oriented to person, place, time, and situation, CNII-XII grossly intact, no focal deficits  Psych: Normal mood and affect   ASSESSMENT:   Phillip Maldonado is a 72 y.o. male who presents for the following: 1. Coronary artery disease involving native coronary artery of native heart without angina pectoris   2. Hyperlipidemia, unspecified hyperlipidemia type   3. Chronic diastolic heart failure (HCC)   4. Primary hypertension   5. Obesity, morbid, BMI 50 or higher (HCC)     PLAN:   1. Coronary artery disease involving native coronary artery of native heart without angina pectoris 2. Hyperlipidemia, unspecified hyperlipidemia type -Status post non-STEMI in November 2023.  Underwent PCI to an OM branch.  We will continue with aspirin and ticagrelor for 1 year.  He may stop in November.  He will see me back around that time to discuss things further. -Lipids are not at goal.  Suspect a lot of this is due to dietary discretions and uncontrolled diabetes.  He is currently on Lipitor 80 mg daily he is on Vascepa 2 g twice daily.  I would like to give him time to lose weight before we further titrate therapy.  He also needs to get his diabetes under control.  If not at goal when I see him back we will consider referral for PCSK9 inhibitor therapy.  3. Chronic diastolic heart failure (HCC) -EF normal.  Volume status controlled on torsemide 50 mg twice daily.  We will continue his current regimen which includes Entresto 24 to 26 mg twice daily, Aldactone 25 mg daily, Farxiga 10 mg daily, carvedilol 12.5 mg twice daily.  Also on amlodipine 10 mg daily.  Really needs to work on losing weight.  4. Primary hypertension -Continue current antihypertensive agents  5. Obesity, morbid, BMI 50 or higher (HCC) -Referral to  [facility] weight and wellness clinic      Disposition: Return in about 6 months (around 02/05/2023).  Medication Adjustments/Labs and Tests Ordered: Current medicines are reviewed at length with the patient today.  Concerns regarding medicines are outlined above.  No orders of the defined types were placed in this encounter.  Meds ordered this encounter  Medications   spironolactone (ALDACTONE) 25 MG tablet    Sig: Take 1 tablet (25 mg total) by mouth daily.    Dispense:  90 tablet    Refill:  3   ticagrelor (BRILINTA) 90 MG TABS tablet    Sig: Take 1 tablet (90 mg total) by mouth 2 (two) times daily.    Dispense:  60 tablet    Refill:  11   amLODipine (NORVASC) 10 MG tablet    Sig: Take 1 tablet (10 mg total) by mouth in the morning.    Dispense:  90 tablet    Refill:  3   atorvastatin (LIPITOR) 80 MG tablet    Sig: Take 1 tablet (  80 mg total) by mouth at bedtime.    Dispense:  90 tablet    Refill:  2   carvedilol (COREG) 12.5 MG tablet    Sig: Take 1 tablet (12.5 mg total) by mouth 2 (two) times daily.    Dispense:  180 tablet    Refill:  3   icosapent Ethyl (VASCEPA) 1 g capsule    Sig: TAKE 2 CAPSULES(2 GRAMS) BY MOUTH TWICE DAILY    Dispense:  120 capsule    Refill:  3   sacubitril-valsartan (ENTRESTO) 24-26 MG    Sig: Take 1 tablet by mouth 2 (two) times daily.    Dispense:  60 tablet    Refill:  3    Patient Instructions  Medication Instructions:  The current medical regimen is effective;  continue present plan and medications.  *If you need a refill on your cardiac medications before your next appointment, please call your pharmacy*   Follow-Up: At [facility], you and your health needs are our priority.  As part of our continuing mission to provide you with exceptional heart care, we have created designated Provider Care Teams.  These Care Teams include your primary Cardiologist (physician) and Advanced Practice Providers (APPs -  Physician Assistants  and Nurse Practitioners) who all work together to provide you with the care you need, when you need it.  We recommend signing up for the patient portal called "MyChart".  Sign up information is provided on this After Visit Summary.  MyChart is used to connect with patients for Virtual Visits (Telemedicine).  Patients are able to view lab/test results, encounter notes, upcoming appointments, etc.  Non-urgent messages can be sent to your provider as well.   To learn more about what you can do with MyChart, go to [email or web address].    Your next appointment:   6 month(s)  Provider:   Reatha Harps, MD     Other Instructions Referral to [facility]- weight and wellness- they will call you to schedule.     Time Spent with Patient: I have spent a total of 35 minutes with patient reviewing hospital notes, telemetry, EKGs, labs and examining the patient as well as establishing an assessment and plan that was discussed with the patient.  > 50% of time was spent in direct patient care.  Signed, Lenna Gilford. Flora Lipps, MD, [facility]  [facility]  [address], [address] [place], [place] [address] [phone]  08/05/2022 9:21 PM

## 2022-08-05 ENCOUNTER — Ambulatory Visit: Payer: Medicare Other | Attending: Cardiovascular Disease | Admitting: Cardiovascular Disease

## 2022-08-05 ENCOUNTER — Encounter: Payer: Self-pay | Admitting: Cardiovascular Disease

## 2022-08-05 VITALS — BP 124/76 | HR 80 | Ht 66.0 in | Wt 391.2 lb

## 2022-08-05 DIAGNOSIS — I10 Essential (primary) hypertension: Secondary | ICD-10-CM

## 2022-08-05 DIAGNOSIS — I2510 Atherosclerotic heart disease of native coronary artery without angina pectoris: Secondary | ICD-10-CM

## 2022-08-05 DIAGNOSIS — I5032 Chronic diastolic (congestive) heart failure: Secondary | ICD-10-CM | POA: Diagnosis not present

## 2022-08-05 DIAGNOSIS — E785 Hyperlipidemia, unspecified: Secondary | ICD-10-CM | POA: Diagnosis not present

## 2022-08-05 MED ORDER — SPIRONOLACTONE 25 MG PO TABS: 25.0000 mg | ORAL_TABLET | Freq: Every day | ORAL | 3 refills | Status: DC

## 2022-08-05 MED ORDER — CARVEDILOL 12.5 MG PO TABS: 12.5000 mg | ORAL_TABLET | Freq: Two times a day (BID) | ORAL | 3 refills | Status: DC

## 2022-08-05 MED ORDER — AMLODIPINE BESYLATE 10 MG PO TABS: 10.0000 mg | ORAL_TABLET | Freq: Every morning | ORAL | 3 refills | Status: AC

## 2022-08-05 MED ORDER — TICAGRELOR 90 MG PO TABS: 90.0000 mg | ORAL_TABLET | Freq: Two times a day (BID) | ORAL | 11 refills | Status: DC

## 2022-08-05 MED ORDER — ATORVASTATIN CALCIUM 80 MG PO TABS: 80.0000 mg | ORAL_TABLET | Freq: Every day | ORAL | 2 refills | Status: DC

## 2022-08-05 MED ORDER — ENTRESTO 24-26 MG PO TABS: 1.0000 | ORAL_TABLET | Freq: Two times a day (BID) | ORAL | 3 refills | Status: DC

## 2022-08-05 MED ORDER — ICOSAPENT ETHYL 1 G PO CAPS: ORAL_CAPSULE | ORAL | 3 refills | Status: DC

## 2022-08-05 NOTE — Patient Instructions (Signed)
Medication Instructions:  The current medical regimen is effective;  continue present plan and medications.  *If you need a refill on your cardiac medications before your next appointment, please call your pharmacy*   Follow-Up: At [facility], you and your health needs are our priority.  As part of our continuing mission to provide you with exceptional heart care, we have created designated Provider Care Teams.  These Care Teams include your primary Cardiologist (physician) and Advanced Practice Providers (APPs -  Physician Assistants and Nurse Practitioners) who all work together to provide you with the care you need, when you need it.  We recommend signing up for the patient portal called "MyChart".  Sign up information is provided on this After Visit Summary.  MyChart is used to connect with patients for Virtual Visits (Telemedicine).  Patients are able to view lab/test results, encounter notes, upcoming appointments, etc.  Non-urgent messages can be sent to your provider as well.   To learn more about what you can do with MyChart, go to [email or web address].    Your next appointment:   6 month(s)  Provider:   Reatha Harps, MD     Other Instructions Referral to [facility]- weight and wellness- they will call you to schedule.

## 2022-08-09 NOTE — Progress Notes (Incomplete)
Chief Complaint:   OBESITY Phillip Maldonado (MR# [id number]) is a 72 y.o. male who presents for evaluation and treatment of obesity and related comorbidities. Current BMI is Body mass index is 61.66 kg/m. Phillip Maldonado has been struggling with his weight for many years and has been unsuccessful in either losing weight, maintaining weight loss, or reaching his healthy weight goal.  Phillip Maldonado is currently in the action stage of change and ready to dedicate time achieving and maintaining a healthier weight. Phillip Maldonado is interested in becoming our patient and working on intensive lifestyle modifications including (but not limited to) diet and exercise for weight loss.  Phillip Maldonado's habits were reviewed today and are as follows: His family eats meals together, he thinks his family will eat healthier with him, his desired weight loss is 182 lbs, he has been heavy most of his life, he started gaining weight in 2002, his heaviest weight ever was 412 pounds, he is a picky eater and doesn't like to eat healthier foods, he has significant food cravings issues, he snacks frequently in the evenings, he skips meals frequently, he is frequently drinking liquids with calories, he frequently makes poor food choices, he frequently eats larger portions than normal, and he struggles with emotional eating.  Depression Screen Phillip Maldonado's Food and Mood (modified PHQ-9) score was 13.    Subjective:   1. Other fatigue Phillip Maldonado admits to daytime somnolence and denies waking up still tired. Patient has a history of symptoms of daytime fatigue, morning fatigue, and morning headache. Phillip Maldonado generally gets 4 hours of sleep per night, and states that he has nightime awakenings and generally restful sleep. Snoring is not present. Apneic episodes are not present. Epworth Sleepiness Score is 18.   2. SOB (shortness of breath) on exertion Phillip Maldonado notes increasing shortness of breath with exercising and seems to be worsening over  time with weight gain. He notes getting out of breath sooner with activity than he used to. This has not gotten worse recently. Phillip Maldonado denies shortness of breath at rest or orthopnea.  3. Type 2 diabetes mellitus with other specified complication, without long-term current use of insulin (HCC) His most recent A1c was 8.1 which is close to goal for age and comorbid conditions.  He is on Victoza, SGLT-2, metformin and rapid acting insulin.         Lab Results  Component Value Date    HGBA1C 8.1 (H) 01/22/2022    HGBA1C 8.6 (H) 01/21/2019    HGBA1C 7.2 (H) 10/01/2016         Lab Results  Component Value Date    LDLCALC 26 01/22/2022    CREATININE 1.40 (H) 02/02/2022   4. Primary hypertension Blood pressure at goal for age and risk category.  On amlodipine 10 mg, carvedilol 12.5 mg twice a day without adverse effects.  Most recent renal parameters stage 3A CKD.  5. OSA (obstructive sleep apnea) ***  6. History of Roux-en-Y gastric bypass He is a limited historian but it appears that he had possible gastric bypass surgery in 2005, developed some complications and had a revision to a possible Roux-en-Y.  7. NAFLD (nonalcoholic fatty liver disease) I reviewed past imaging he does have evidence of hepatic steatosis on ultrasound.  He also has sleep apnea and therefore is at risk for hepatic impairment in the future.  Assessment/Plan:   1. Other fatigue Phillip Maldonado does feel that his weight is causing his energy to be lower than it should be. Fatigue may  be related to obesity, depression or many other causes. Labs will be ordered, and in the meanwhile, Phillip Maldonado will focus on self care including making healthy food choices, increasing physical activity and focusing on stress reduction.  - Vitamin B12 - CBC with Differential/Platelet  2. SOB (shortness of breath) on exertion Phillip Maldonado does feel that he gets out of breath more easily that he used to when he exercises. Phillip Maldonado's shortness of  breath appears to be obesity related and exercise induced. He has agreed to work on weight loss and gradually increase exercise to treat his exercise induced shortness of breath. Will continue to monitor closely.  3. Type 2 diabetes mellitus with other specified complication, without long-term current use of insulin (HCC) Losing 10 to 15% of body weight may improve condition.  We also have to monitor closely for hypoglycemia when he starts changing macronutrient distribution.  He may also benefit from switching to either Ozempic or Mounjaro if there are no coverage restrictions.  The latter to have a higher affinity for the hypothalamus so they are more effective for weight loss, they are also effective in treating hepatic steatosis.  I think he had problems with finding medication in the past which may or may not have improved.  - Hemoglobin A1c - Insulin, random - Lipid Panel With LDL/HDL Ratio  4. Primary hypertension Continue current regimen.  Losing 10% of body weight may improve blood pressure control.  Counseled on maintaining adequate hydration and monitoring for orthostatic hypotension.  5. OSA (obstructive sleep apnea) ***  6. History of Roux-en-Y gastric bypass We will review his records further time permitting if he indeed have Roux-en-Y he needs his vitamin levels checked at the next office visit.  I am also unsure if he is taking bariatric vitamins.  I also explained to patient that this may result in metabolic adaptations that will make weight loss very difficult at his age.  7. NAFLD (nonalcoholic fatty liver disease) Losing 15% of body weight may improve condition.  We will also counseled him on nutrition at his intake appointment.  He is also a candidate for incretin therapy.  We will calculate fibrosis score at the next visit.  - Comprehensive metabolic panel  8. Depression screen Phillip Maldonado had a positive depression screening. Depression is commonly associated with obesity  and often results in emotional eating behaviors. We will monitor this closely and work on CBT to help improve the non-hunger eating patterns. Referral to Psychology may be required if no improvement is seen as he continues in our clinic.  9. Class 3 severe obesity with serious comorbidity and body mass index (BMI) of 60.0 to 69.9 in adult, unspecified obesity type (HCC) - TSH - VITAMIN D 25 Hydroxy (Vit-D Deficiency, Fractures)  Phillip Maldonado is currently in the action stage of change and his goal is to continue with weight loss efforts. I recommend Phillip Maldonado begin the structured treatment plan as follows:  He has agreed to {MWMwtlossportion/plan2:23431}.  Exercise goals: {MWM EXERCISE RECS:23473}   Behavioral modification strategies: {MWMwtlossdietstrategies3:23432}.  He was informed of the importance of frequent follow-up visits to maximize his success with intensive lifestyle modifications for his multiple health conditions. He was informed we would discuss his lab results at his next visit unless there is a critical issue that needs to be addressed sooner. Phillip Maldonado agreed to keep his next visit at the agreed upon time to discuss these results.  Objective:   Blood pressure 118/63, pulse 76, temperature 98.2 F (36.8 C), height 5\' 6"  (  1.676 m), weight (!) 382 lb (173.3 kg), SpO2 96 %. Body mass index is 61.66 kg/m.  EKG: Normal sinus rhythm, rate 76 BPM.  Indirect Calorimeter completed today shows a VO2 of 385 and a REE of 2664.  His calculated basal metabolic rate is (unable to obtain) thus his basal metabolic rate is  (unable to determine)  than expected.  General: Cooperative, alert, well developed, in no acute distress. HEENT: Conjunctivae and lids unremarkable. Cardiovascular: Regular rhythm.  Lungs: Normal work of breathing. Neurologic: No focal deficits.   Lab Results  Component Value Date   CREATININE 1.34 (H) 07/16/2022   BUN 22 07/16/2022   NA 137 07/16/2022   K 3.6  07/16/2022   CL 101 07/16/2022   CO2 24 07/16/2022   Lab Results  Component Value Date   ALT 29 07/16/2022   AST 42 (H) 07/16/2022   ALKPHOS 40 07/16/2022   BILITOT 0.9 07/16/2022   Lab Results  Component Value Date   HGBA1C 7.3 (H) 07/14/2022   HGBA1C 7.6 (H) 06/24/2022   HGBA1C 8.1 (H) 01/22/2022   HGBA1C 8.6 (H) 01/21/2019   HGBA1C 7.2 (H) 10/01/2016   Lab Results  Component Value Date   INSULIN 15.5 06/24/2022   Lab Results  Component Value Date   TSH 0.834 07/14/2022   Lab Results  Component Value Date   CHOL 210 (H) 06/24/2022   HDL 39 (L) 06/24/2022   LDLCALC 111 (H) 06/24/2022   TRIG 350 (H) 06/24/2022   CHOLHDL 3.5 01/22/2022   Lab Results  Component Value Date   WBC 6.1 07/17/2022   HGB 13.0 07/17/2022   HCT 39.9 07/17/2022   MCV 94.5 07/17/2022   PLT 159 07/17/2022   Lab Results  Component Value Date   IRON 35 (L) 09/16/2013   TIBC 397 09/16/2013   FERRITIN 11 (L) 09/16/2013   Attestation Statements:   Reviewed by clinician on day of visit: allergies, medications, problem list, medical history, surgical history, family history, social history, and previous encounter notes.  Time spent on visit including pre-visit chart review and post-visit charting and care was 40 minutes.   I, Burt Knack, am acting as transcriptionist for Worthy Rancher, MD.  I have reviewed the above documentation for accuracy and completeness, and I agree with the above. - ***

## 2022-08-19 ENCOUNTER — Telehealth: Payer: Self-pay | Admitting: Cardiovascular Disease

## 2022-08-19 ENCOUNTER — Other Ambulatory Visit: Payer: Self-pay | Admitting: Urology

## 2022-08-19 DIAGNOSIS — N281 Cyst of kidney, acquired: Secondary | ICD-10-CM

## 2022-08-19 MED ORDER — DAPAGLIFLOZIN PROPANEDIOL 10 MG PO TABS: 10.0000 mg | ORAL_TABLET | Freq: Every day | ORAL | 3 refills | Status: DC

## 2022-08-19 NOTE — Telephone Encounter (Signed)
Pt's medication was sent to pt's pharmacy as requested. Confirmation received.  °

## 2022-08-19 NOTE — Telephone Encounter (Signed)
*  STAT* If patient is at the pharmacy, call can be transferred to refill team.   1. Which medications need to be refilled? (please list name of each medication and dose if known)  dapagliflozin propanediol (FARXIGA) 10 MG TABS tablet   2. Which pharmacy/location (including street and city if local pharmacy) is medication to be sent to?  [id number] - [place], [place] - [address] AT [facility] [address]    3. Do they need a 30 day or 90 day supply? 90 day

## 2022-09-30 ENCOUNTER — Other Ambulatory Visit: Payer: Medicare Other

## 2022-09-30 DIAGNOSIS — N281 Cyst of kidney, acquired: Secondary | ICD-10-CM

## 2022-10-01 ENCOUNTER — Other Ambulatory Visit: Payer: Self-pay | Admitting: Cardiovascular Disease

## 2022-10-01 ENCOUNTER — Other Ambulatory Visit: Payer: Self-pay

## 2022-10-01 MED ORDER — SILDENAFIL CITRATE 50 MG PO TABS: ORAL_TABLET | ORAL | 1 refills | Status: DC

## 2022-10-01 NOTE — Telephone Encounter (Signed)
Pt's pharmacy is requesting a refill on sildenafil. Would Dr. Flora Lipps like to refill this medication? Please address

## 2022-10-06 ENCOUNTER — Other Ambulatory Visit: Payer: Self-pay | Admitting: Urology

## 2022-10-06 DIAGNOSIS — N281 Cyst of kidney, acquired: Secondary | ICD-10-CM

## 2022-10-16 ENCOUNTER — Other Ambulatory Visit: Payer: Self-pay | Admitting: Cardiovascular Disease

## 2022-10-27 ENCOUNTER — Ambulatory Visit
Admission: RE | Admit: 2022-10-27 | Discharge: 2022-10-27 | Disposition: A | Discharge status: Home / Self Care | Payer: Medicare Other | Source: Ambulatory Visit | Attending: Urology | Admitting: Urology

## 2022-10-27 DIAGNOSIS — N281 Cyst of kidney, acquired: Secondary | ICD-10-CM

## 2022-10-27 MED ORDER — IOPAMIDOL (ISOVUE-370) INJECTION 76%
500.0000 mL | Freq: Once | INTRAVENOUS | Status: AC | PRN
  Administered 2022-10-27: 96 mL via INTRAVENOUS

## 2022-10-28 ENCOUNTER — Telehealth: Payer: Self-pay | Admitting: Cardiovascular Disease

## 2022-10-28 MED ORDER — TORSEMIDE 10 MG PO TABS: ORAL_TABLET | ORAL | 1 refills | Status: DC

## 2022-10-28 NOTE — Telephone Encounter (Signed)
Returned pt call to let him know this nurse called pharmacy to confirm RX. Pt has enough medication to last for another 9 days and then the refill will be available at that time. Pt verbalized understanding.

## 2022-10-28 NOTE — Telephone Encounter (Signed)
Called pt in regards to his medication torsemide. He needed clarification on his medication. The refill was sent to [facility]. Pt verbalized understanding.

## 2022-10-28 NOTE — Telephone Encounter (Signed)
Pt is requesting a callback regarding there being a mix up at the pharmacy cause them to tell pt its too soon for a refill. Pt would like to discuss further with nurse. Please advise

## 2022-10-28 NOTE — Telephone Encounter (Signed)
Pt c/o medication issue:  1. Name of Medication: torsemide (DEMADEX) 10 MG tablet   2. How are you currently taking this medication (dosage and times per day)? Take 50 mg (5 tablets) twice daily   3. Are you having a reaction (difficulty breathing--STAT)? No  4. What is your medication issue? Pt states that pharmacy on file is requesting above prescription to be sent over again. Please advise

## 2022-11-06 ENCOUNTER — Other Ambulatory Visit: Payer: Self-pay | Admitting: Cardiovascular Disease

## 2022-12-01 ENCOUNTER — Other Ambulatory Visit: Payer: Self-pay | Admitting: Cardiovascular Disease

## 2022-12-01 NOTE — Telephone Encounter (Signed)
*  STAT* If patient is at the pharmacy, call can be transferred to refill team.   1. Which medications need to be refilled? (please list name of each medication and dose if known) sildenafil (VIAGRA) 50 MG tablet  2. Which pharmacy/location (including street and city if local pharmacy) is medication to be sent to? [id number] - [place], [place] - [address] AT [facility] [address]   3. Do they need a 30 day or 90 day supply?  90 day supply

## 2022-12-01 NOTE — Telephone Encounter (Signed)
Pt calling requesting a refill on sildenafil. Would Dr. Flora Lipps like to refill this medication? Please address

## 2022-12-02 MED ORDER — SILDENAFIL CITRATE 50 MG PO TABS: ORAL_TABLET | ORAL | 6 refills | Status: DC

## 2022-12-02 NOTE — Telephone Encounter (Signed)
medication refilled.

## 2022-12-03 ENCOUNTER — Telehealth: Payer: Self-pay | Admitting: Cardiovascular Disease

## 2022-12-03 ENCOUNTER — Other Ambulatory Visit: Payer: Self-pay | Admitting: Cardiovascular Disease

## 2022-12-03 NOTE — Telephone Encounter (Signed)
Pt c/o medication issue:  1. Name of Medication:  sildenafil (VIAGRA) 50 MG tablet  2. How are you currently taking this medication (dosage and times per day)?   3. Are you having a reaction (difficulty breathing--STAT)?   4. What is your medication issue?   Patient is requesting to have his prescription quantity increased from 10 tablets to 30 tablets.

## 2022-12-03 NOTE — Telephone Encounter (Signed)
Message sent to Dr.O'Neal for advice if ok for 30 tablets.

## 2022-12-04 MED ORDER — SILDENAFIL CITRATE 50 MG PO TABS: ORAL_TABLET | ORAL | 3 refills | Status: DC

## 2022-12-04 NOTE — Telephone Encounter (Signed)
Viagra 50 mg # 30 tablets refill sent to pharmacy.

## 2022-12-11 ENCOUNTER — Other Ambulatory Visit: Payer: Self-pay | Admitting: Cardiovascular Disease

## 2023-01-15 ENCOUNTER — Other Ambulatory Visit (HOSPITAL_COMMUNITY): Payer: Self-pay

## 2023-01-25 ENCOUNTER — Other Ambulatory Visit: Payer: Self-pay | Admitting: Home Health

## 2023-01-28 ENCOUNTER — Telehealth: Payer: Self-pay

## 2023-01-28 ENCOUNTER — Other Ambulatory Visit (HOSPITAL_COMMUNITY): Payer: Self-pay

## 2023-01-28 NOTE — Telephone Encounter (Signed)
Pharmacy Patient Advocate Encounter   Received notification from CoverMyMeds that prior authorization for ICOSAPENT is required/requested.   Insurance verification completed.   The patient is insured through [facility] .   Per test claim: PA required; PA submitted to above mentioned insurance via CoverMyMeds Key/confirmation #/EOC [id number] Status is pending

## 2023-01-29 ENCOUNTER — Other Ambulatory Visit (HOSPITAL_COMMUNITY): Payer: Self-pay

## 2023-01-29 NOTE — Telephone Encounter (Signed)
Pharmacy Patient Advocate Encounter  Received notification from [employer] [facility] that Prior Authorization for VASCEPA has been APPROVED from 01/29/23 to 03/16/23

## 2023-02-19 ENCOUNTER — Other Ambulatory Visit: Payer: Self-pay | Admitting: Cardiovascular Disease

## 2023-02-22 ENCOUNTER — Other Ambulatory Visit: Payer: Self-pay | Admitting: Cardiovascular Disease

## 2023-03-03 ENCOUNTER — Encounter: Payer: Self-pay | Admitting: Podiatry

## 2023-03-03 ENCOUNTER — Ambulatory Visit: Payer: Medicare HMO | Admitting: Podiatry

## 2023-03-03 DIAGNOSIS — L600 Ingrowing nail: Secondary | ICD-10-CM

## 2023-03-04 NOTE — Progress Notes (Signed)
Subjective:   Patient ID: Phillip Maldonado, male   DOB: 72 y.o.   MRN: [id number]   HPI Patient presents with caregiver stating he had severe damage to his left big toenail and left toe it was bleeding and the nail is partially Ag partially off now.  He also weighs 390 pounds and does deal with that.  Patient states this occurred a number of days ago and he does not smoke is not active   Review of Systems  All other systems reviewed and are negative.       Objective:  Physical Exam Vitals and nursing note reviewed.  Constitutional:      Appearance: He is well-developed.  Pulmonary:     Effort: Pulmonary effort is normal.  Musculoskeletal:        General: Normal range of motion.  Skin:    General: Skin is warm.  Neurological:     Mental Status: He is alert.     Vascular status was found to be intact neurologically mild diminishment with root partially detached left big toenail painful with pressure with proximal skin irritation secondary to the injury.  Patient has good digital perfusion well-oriented with no proximal erythema noted     Assessment:  Severe trauma to the left hallux nail cannot rule out bone damage or skin issues     Plan:  H&P reviewed the nail is partially detached and I have recommended removal on our marker probably 60 mg Xylocaine Marcaine mixture sterile prep done and sterile instrumentation removed the nail flushed out the bed and applied sterile dressing that I wanted to leave on 24 hours.  Reappoint for [place] to recheck and if any redness drainage or pain were to occur he is to let us know immediately.  Should heal uneventfully nail may have to be removed permanently long-term

## 2023-04-01 ENCOUNTER — Other Ambulatory Visit: Payer: Self-pay | Admitting: Cardiovascular Disease

## 2023-04-01 MED ORDER — ATORVASTATIN CALCIUM 80 MG PO TABS: 80.0000 mg | ORAL_TABLET | Freq: Every day | ORAL | 1 refills | Status: DC

## 2023-04-01 MED ORDER — SILDENAFIL CITRATE 50 MG PO TABS: ORAL_TABLET | ORAL | 3 refills | Status: DC

## 2023-04-01 NOTE — Telephone Encounter (Signed)
Pt is requesting a refill on sildenafil. Would Dr. Flora Lipps like to refill this medication? Please address

## 2023-04-01 NOTE — Telephone Encounter (Signed)
*  STAT* If patient is at the pharmacy, call can be transferred to refill team.   1. Which medications need to be refilled? (please list name of each medication and dose if known)   atorvastatin (LIPITOR) 80 MG tablet   sildenafil (VIAGRA) 50 MG tablet   2. Would you like to learn more about the convenience, safety, & potential cost savings by using the [facility]?   3. Are you open to using the [facility] (Type [facility]. ).  4. Which pharmacy/location (including street and city if local pharmacy) is medication to be sent to?  [id number] [place], [place] - [address]   5. Do they need a 30 day or 90 day supply?   90 day  Patient stated he is almost out of these medications.

## 2023-04-08 ENCOUNTER — Ambulatory Visit (HOSPITAL_BASED_OUTPATIENT_CLINIC_OR_DEPARTMENT_OTHER): Payer: Medicare HMO | Admitting: General Surgery

## 2023-05-31 ENCOUNTER — Encounter (HOSPITAL_BASED_OUTPATIENT_CLINIC_OR_DEPARTMENT_OTHER): Payer: Medicare HMO | Attending: General Surgery | Admitting: Internal Medicine

## 2023-07-25 ENCOUNTER — Other Ambulatory Visit: Payer: Self-pay | Admitting: Cardiovascular Disease

## 2023-08-19 ENCOUNTER — Other Ambulatory Visit: Payer: Self-pay | Admitting: Cardiovascular Disease

## 2023-08-21 ENCOUNTER — Other Ambulatory Visit: Payer: Self-pay | Admitting: Cardiovascular Disease

## 2023-08-22 ENCOUNTER — Other Ambulatory Visit: Payer: Self-pay | Admitting: Cardiovascular Disease

## 2023-08-26 ENCOUNTER — Other Ambulatory Visit: Payer: Self-pay | Admitting: Cardiovascular Disease

## 2023-09-08 ENCOUNTER — Other Ambulatory Visit: Payer: Self-pay | Admitting: Cardiovascular Disease

## 2023-09-10 ENCOUNTER — Other Ambulatory Visit: Payer: Self-pay | Admitting: Cardiovascular Disease

## 2023-09-11 ENCOUNTER — Other Ambulatory Visit: Payer: Self-pay | Admitting: Cardiovascular Disease

## 2023-09-19 ENCOUNTER — Other Ambulatory Visit: Payer: Self-pay | Admitting: Cardiovascular Disease

## 2023-09-20 ENCOUNTER — Other Ambulatory Visit: Payer: Self-pay | Admitting: Cardiovascular Disease

## 2023-09-21 ENCOUNTER — Other Ambulatory Visit: Payer: Self-pay | Admitting: Cardiovascular Disease

## 2023-09-25 ENCOUNTER — Other Ambulatory Visit: Payer: Self-pay | Admitting: Cardiovascular Disease

## 2023-09-28 ENCOUNTER — Other Ambulatory Visit: Payer: Self-pay | Admitting: Cardiovascular Disease

## 2023-09-30 ENCOUNTER — Other Ambulatory Visit: Payer: Self-pay | Admitting: Cardiovascular Disease

## 2023-10-08 ENCOUNTER — Other Ambulatory Visit: Payer: Self-pay | Admitting: Cardiovascular Disease

## 2023-10-17 ENCOUNTER — Other Ambulatory Visit: Payer: Self-pay | Admitting: Cardiovascular Disease

## 2023-10-28 ENCOUNTER — Other Ambulatory Visit: Payer: Self-pay | Admitting: Cardiovascular Disease

## 2023-11-04 ENCOUNTER — Other Ambulatory Visit: Payer: Self-pay | Admitting: Cardiovascular Disease

## 2023-11-17 DIAGNOSIS — I5032 Chronic diastolic (congestive) heart failure: Secondary | ICD-10-CM | POA: Diagnosis present

## 2023-12-17 ENCOUNTER — Other Ambulatory Visit: Payer: Self-pay | Admitting: Cardiovascular Disease

## 2023-12-27 ENCOUNTER — Other Ambulatory Visit: Payer: Self-pay | Admitting: Cardiovascular Disease

## 2024-01-29 DIAGNOSIS — N183 Chronic kidney disease, stage 3 unspecified: Secondary | ICD-10-CM | POA: Insufficient documentation

## 2024-01-29 DIAGNOSIS — I480 Paroxysmal atrial fibrillation: Secondary | ICD-10-CM | POA: Diagnosis present

## 2024-01-29 DIAGNOSIS — I252 Old myocardial infarction: Secondary | ICD-10-CM | POA: Insufficient documentation

## 2024-02-14 ENCOUNTER — Other Ambulatory Visit: Payer: Self-pay | Admitting: Cardiovascular Disease

## 2024-02-18 NOTE — Telephone Encounter (Signed)
 Pt of Dr. Barbaraann. Past 3rd attempt. Does Dr. Barbaraann want to refill? Please advise.

## 2024-02-24 ENCOUNTER — Other Ambulatory Visit: Payer: Self-pay | Admitting: Cardiovascular Disease

## 2024-02-25 NOTE — Telephone Encounter (Signed)
 Pt of Dr. Barbaraann. Passed 3rd attempt. Does Dr. Barbaraann want to refill? Please advise.

## 2024-03-11 ENCOUNTER — Emergency Department (HOSPITAL_COMMUNITY)

## 2024-03-11 ENCOUNTER — Inpatient Hospital Stay (HOSPITAL_COMMUNITY)
Admission: EM | Admit: 2024-03-11 | Discharge: 2024-03-15 | DRG: 864 | Disposition: A | Discharge status: Home Health | Attending: Internal Medicine | Admitting: Internal Medicine

## 2024-03-11 DIAGNOSIS — Z794 Long term (current) use of insulin: Secondary | ICD-10-CM

## 2024-03-11 DIAGNOSIS — G894 Chronic pain syndrome: Secondary | ICD-10-CM | POA: Diagnosis present

## 2024-03-11 DIAGNOSIS — Z7985 Long-term (current) use of injectable non-insulin antidiabetic drugs: Secondary | ICD-10-CM

## 2024-03-11 DIAGNOSIS — R350 Frequency of micturition: Secondary | ICD-10-CM | POA: Diagnosis present

## 2024-03-11 DIAGNOSIS — Z808 Family history of malignant neoplasm of other organs or systems: Secondary | ICD-10-CM

## 2024-03-11 DIAGNOSIS — I10 Essential (primary) hypertension: Secondary | ICD-10-CM | POA: Diagnosis present

## 2024-03-11 DIAGNOSIS — G4733 Obstructive sleep apnea (adult) (pediatric): Secondary | ICD-10-CM | POA: Diagnosis present

## 2024-03-11 DIAGNOSIS — Z8711 Personal history of peptic ulcer disease: Secondary | ICD-10-CM

## 2024-03-11 DIAGNOSIS — R54 Age-related physical debility: Secondary | ICD-10-CM | POA: Diagnosis present

## 2024-03-11 DIAGNOSIS — Z7902 Long term (current) use of antithrombotics/antiplatelets: Secondary | ICD-10-CM

## 2024-03-11 DIAGNOSIS — G9332 Myalgic encephalomyelitis/chronic fatigue syndrome: Secondary | ICD-10-CM | POA: Diagnosis present

## 2024-03-11 DIAGNOSIS — Z9884 Bariatric surgery status: Secondary | ICD-10-CM

## 2024-03-11 DIAGNOSIS — I872 Venous insufficiency (chronic) (peripheral): Secondary | ICD-10-CM | POA: Diagnosis present

## 2024-03-11 DIAGNOSIS — J029 Acute pharyngitis, unspecified: Secondary | ICD-10-CM | POA: Diagnosis present

## 2024-03-11 DIAGNOSIS — A419 Sepsis, unspecified organism: Principal | ICD-10-CM | POA: Diagnosis present

## 2024-03-11 DIAGNOSIS — E1142 Type 2 diabetes mellitus with diabetic polyneuropathy: Secondary | ICD-10-CM | POA: Diagnosis present

## 2024-03-11 DIAGNOSIS — Z888 Allergy status to other drugs, medicaments and biological substances status: Secondary | ICD-10-CM

## 2024-03-11 DIAGNOSIS — Z7984 Long term (current) use of oral hypoglycemic drugs: Secondary | ICD-10-CM

## 2024-03-11 DIAGNOSIS — Z8701 Personal history of pneumonia (recurrent): Secondary | ICD-10-CM

## 2024-03-11 DIAGNOSIS — R338 Other retention of urine: Secondary | ICD-10-CM | POA: Diagnosis present

## 2024-03-11 DIAGNOSIS — Z818 Family history of other mental and behavioral disorders: Secondary | ICD-10-CM

## 2024-03-11 DIAGNOSIS — K5790 Diverticulosis of intestine, part unspecified, without perforation or abscess without bleeding: Secondary | ICD-10-CM | POA: Diagnosis present

## 2024-03-11 DIAGNOSIS — Z7901 Long term (current) use of anticoagulants: Secondary | ICD-10-CM

## 2024-03-11 DIAGNOSIS — I480 Paroxysmal atrial fibrillation: Secondary | ICD-10-CM | POA: Diagnosis present

## 2024-03-11 DIAGNOSIS — E1122 Type 2 diabetes mellitus with diabetic chronic kidney disease: Secondary | ICD-10-CM | POA: Diagnosis present

## 2024-03-11 DIAGNOSIS — I110 Hypertensive heart disease with heart failure: Secondary | ICD-10-CM | POA: Diagnosis present

## 2024-03-11 DIAGNOSIS — Z9049 Acquired absence of other specified parts of digestive tract: Secondary | ICD-10-CM

## 2024-03-11 DIAGNOSIS — Z833 Family history of diabetes mellitus: Secondary | ICD-10-CM

## 2024-03-11 DIAGNOSIS — Z7982 Long term (current) use of aspirin: Secondary | ICD-10-CM

## 2024-03-11 DIAGNOSIS — E1151 Type 2 diabetes mellitus with diabetic peripheral angiopathy without gangrene: Secondary | ICD-10-CM | POA: Diagnosis present

## 2024-03-11 DIAGNOSIS — Z86718 Personal history of other venous thrombosis and embolism: Secondary | ICD-10-CM

## 2024-03-11 DIAGNOSIS — F4310 Post-traumatic stress disorder, unspecified: Secondary | ICD-10-CM | POA: Diagnosis present

## 2024-03-11 DIAGNOSIS — I5043 Acute on chronic combined systolic (congestive) and diastolic (congestive) heart failure: Secondary | ICD-10-CM

## 2024-03-11 DIAGNOSIS — R509 Fever, unspecified: Principal | ICD-10-CM | POA: Diagnosis present

## 2024-03-11 DIAGNOSIS — M542 Cervicalgia: Secondary | ICD-10-CM | POA: Diagnosis present

## 2024-03-11 DIAGNOSIS — Z79899 Other long term (current) drug therapy: Secondary | ICD-10-CM

## 2024-03-11 DIAGNOSIS — Z8261 Family history of arthritis: Secondary | ICD-10-CM

## 2024-03-11 DIAGNOSIS — Z8249 Family history of ischemic heart disease and other diseases of the circulatory system: Secondary | ICD-10-CM

## 2024-03-11 DIAGNOSIS — R6510 Systemic inflammatory response syndrome (SIRS) of non-infectious origin without acute organ dysfunction: Secondary | ICD-10-CM | POA: Diagnosis present

## 2024-03-11 DIAGNOSIS — Z955 Presence of coronary angioplasty implant and graft: Secondary | ICD-10-CM

## 2024-03-11 DIAGNOSIS — I739 Peripheral vascular disease, unspecified: Secondary | ICD-10-CM | POA: Diagnosis present

## 2024-03-11 DIAGNOSIS — K760 Fatty (change of) liver, not elsewhere classified: Secondary | ICD-10-CM | POA: Diagnosis present

## 2024-03-11 DIAGNOSIS — Z91040 Latex allergy status: Secondary | ICD-10-CM

## 2024-03-11 DIAGNOSIS — I5032 Chronic diastolic (congestive) heart failure: Secondary | ICD-10-CM | POA: Diagnosis present

## 2024-03-11 DIAGNOSIS — I2510 Atherosclerotic heart disease of native coronary artery without angina pectoris: Secondary | ICD-10-CM | POA: Diagnosis present

## 2024-03-11 DIAGNOSIS — I252 Old myocardial infarction: Secondary | ICD-10-CM

## 2024-03-11 DIAGNOSIS — E66813 Obesity, class 3: Secondary | ICD-10-CM | POA: Diagnosis present

## 2024-03-11 DIAGNOSIS — Z6841 Body Mass Index (BMI) 40.0 and over, adult: Secondary | ICD-10-CM

## 2024-03-11 DIAGNOSIS — Z1152 Encounter for screening for COVID-19: Secondary | ICD-10-CM

## 2024-03-11 DIAGNOSIS — Z8262 Family history of osteoporosis: Secondary | ICD-10-CM

## 2024-03-11 DIAGNOSIS — H9190 Unspecified hearing loss, unspecified ear: Secondary | ICD-10-CM | POA: Diagnosis present

## 2024-03-11 DIAGNOSIS — Z823 Family history of stroke: Secondary | ICD-10-CM

## 2024-03-11 DIAGNOSIS — N401 Enlarged prostate with lower urinary tract symptoms: Secondary | ICD-10-CM | POA: Diagnosis present

## 2024-03-11 DIAGNOSIS — E872 Acidosis, unspecified: Principal | ICD-10-CM | POA: Diagnosis present

## 2024-03-11 DIAGNOSIS — E1165 Type 2 diabetes mellitus with hyperglycemia: Secondary | ICD-10-CM | POA: Diagnosis present

## 2024-03-11 DIAGNOSIS — E119 Type 2 diabetes mellitus without complications: Secondary | ICD-10-CM

## 2024-03-11 DIAGNOSIS — Z83438 Family history of other disorder of lipoprotein metabolism and other lipidemia: Secondary | ICD-10-CM

## 2024-03-11 DIAGNOSIS — N183 Chronic kidney disease, stage 3 unspecified: Secondary | ICD-10-CM | POA: Diagnosis present

## 2024-03-11 DIAGNOSIS — E7800 Pure hypercholesterolemia, unspecified: Secondary | ICD-10-CM | POA: Diagnosis present

## 2024-03-11 DIAGNOSIS — Z825 Family history of asthma and other chronic lower respiratory diseases: Secondary | ICD-10-CM

## 2024-03-11 LAB — I-STAT CHEM 8, ED
BUN: 23 mg/dL (ref 8–23)
Calcium, Ion: 1.17 mmol/L (ref 1.15–1.40)
Chloride: 104 mmol/L (ref 98–111)
Creatinine, Ser: 1.2 mg/dL (ref 0.61–1.24)
Glucose, Bld: 199 mg/dL — ABNORMAL HIGH (ref 70–99)
HCT: 47 % (ref 39.0–52.0)
Hemoglobin: 16 g/dL (ref 13.0–17.0)
Potassium: 4.2 mmol/L (ref 3.5–5.1)
Sodium: 138 mmol/L (ref 135–145)
TCO2: 27 mmol/L (ref 22–32)

## 2024-03-11 LAB — I-STAT CG4 LACTIC ACID, ED: Lactic Acid, Venous: 2.2 mmol/L (ref 0.5–1.9)

## 2024-03-11 NOTE — ED Triage Notes (Signed)
 BIB EMS from home. Called out by wife for sliding off couch and couldn't get him up  Full body aches and fever x 3 days per wife.  New onset of immobility.  Usually uses walker independently. Not able to stand with EMS.  No n/v/d.  140 palp, 97 pulse, 94% on RA.  CBG 293.  Hx of diabetes.

## 2024-03-11 NOTE — ED Provider Notes (Signed)
 " Phillip Maldonado Provider Note   CSN: [id number] Arrival date & time: 03/11/24  2152     Patient presents with: Fatigue   Phillip Maldonado is a 73 y.o. male.  {Add pertinent medical, surgical, social history, OB history to YEP:67052} The history is provided by the patient.   Phillip Maldonado is a 73 y.o. male who presents to the Emergency Department complaining of *** Fall today.  Three days right sided cp.  Thirsty.  Fever to 101    Prior to Admission medications  Medication Sig Start Date End Date Taking? Authorizing Provider  acetaminophen  (TYLENOL ) 500 MG tablet Take 1,000 mg by mouth in the morning and at bedtime.    [provider]  amLODipine  (NORVASC ) 10 MG tablet Take 1 tablet (10 mg total) by mouth in the morning. 08/05/22   O'Neal, Darryle Ned, MD  aspirin  EC 81 MG tablet Take 1 tablet (81 mg total) by mouth daily. Swallow whole. Patient taking differently: Take 81 mg by mouth in the morning and at bedtime. Swallow whole. 01/24/22   Zhao, Xika, NP  atorvastatin  (LIPITOR ) 80 MG tablet Take 1 tablet (80 mg total) by mouth daily. 04/01/23   Barbaraann Darryle Ned, MD  carvedilol  (COREG ) 12.5 MG tablet TAKE 1 TABLET BY MOUTH TWICE A DAY 09/14/23   O'Neal, Darryle Ned, MD  cholecalciferol (VITAMIN D3) 25 MCG (1000 UNIT) tablet Take 1,000 Units by mouth daily.    [provider]  dapagliflozin  propanediol (FARXIGA ) 10 MG TABS tablet TAKE 1 TABLET BY MOUTH EVERY DAY 08/23/23   O'Neal, Darryle Ned, MD  docusate sodium  (COLACE) 100 MG capsule Take 100 mg by mouth 2 (two) times daily as needed for mild constipation.    [provider]  DULoxetine  (CYMBALTA ) 60 MG capsule Take 1 capsule (60 mg total) by mouth daily. 02/11/19 01/22/23  Segal, Jared E, DO  glucose blood (ACCU-CHEK AVIVA PLUS) test strip 1 each by Other route See admin instructions. Check blood sugar twice daily    [provider]  HUMALOG  KWIKPEN 100 UNIT/ML KwikPen Inject 30 Units into the skin at bedtime. 03/13/22   [provider]  icosapent  Ethyl (VASCEPA ) 1 g capsule TAKE 2 CAPSULES TWICE A DAY 02/23/23   O'Neal, Darryle Ned, MD  metFORMIN  (GLUCOPHAGE ) 1000 MG tablet Take 1,000 mg by mouth 2 (two) times daily.    [provider]  Multiple Vitamins-Minerals (CENTRUM SILVER  50+MEN) TABS Take 1 tablet by mouth daily.    [provider]  pantoprazole  (PROTONIX ) 40 MG tablet Take 40 mg by mouth 2 (two) times daily.    [provider]  Polyethyl Glycol-Propyl Glycol (LUBRICANT EYE DROPS) 0.4-0.3 % SOLN Place 1 drop into both eyes 3 (three) times daily as needed (dry/irritated eyes.).    [provider]  potassium chloride  (KLOR-CON  M) 10 MEQ tablet TAKE 1 TABLET(10 MEQ) BY MOUTH TWICE DAILY 10/16/22   O'Neal, Darryle Ned, MD  potassium chloride  (KLOR-CON ) 10 MEQ tablet TAKE 1 TABLET BY MOUTH TWICE A DAY 10/18/23   O'Neal, Darryle Ned, MD  pregabalin  (LYRICA ) 200 MG capsule Take 200 mg by mouth 3 (three) times daily.    [provider]  sacubitril -valsartan  (ENTRESTO ) 24-26 MG TAKE 1 TABLET BY MOUTH TWICE DAILY 12/11/22   O'Neal, Darryle Ned, MD  sildenafil  (VIAGRA ) 50 MG tablet TAKE 1 TABLET BY MOUTH EVERY DAY AS NEEDED ERECTILE DYSFUNCTION 09/27/23   O'Neal, Darryle Ned, MD  silver  sulfADIAZINE  (  SILVADENE ) 1 % cream Apply 1 Application topically 2 (two) times daily as needed (skin irritation). Mix at home 1:1 with triamcinolone  and apply 2 times a day to upper back, chest, under breast, groin areas and legs. 04/23/20   [provider]  spironolactone  (ALDACTONE ) 25 MG tablet Take 1 tablet (25 mg total) by mouth daily. NEED OV LAST ATTEMPT. 11/04/23   O'Neal, Darryle Ned, MD  tamsulosin  (FLOMAX ) 0.4 MG CAPS capsule Take 1 capsule (0.4 mg total) by mouth daily. 07/17/22   Ghimire, Donalda HERO, MD  ticagrelor  (BRILINTA ) 90 MG TABS tablet Take 1 tablet (90 mg total) by mouth 2  (two) times daily. 10/01/23   Barbaraann Darryle Ned, MD  torsemide  (DEMADEX ) 10 MG tablet TAKE 5 TABLETS BY MOUTH TWICE DAILY 09/21/23   O'Neal, Darryle Ned, MD  traZODone  (DESYREL ) 300 MG tablet Take 1 tablet (300 mg total) by mouth at bedtime. 02/10/19 01/22/23  Segal, Jared E, DO  triamcinolone  cream (KENALOG ) 0.1 % Apply 1 Application topically 2 (two) times daily as needed (skin irritation.). Mix at home 1:1 with triamcinolone  and apply 2 times a day to upper back, chest, under breast, groin areas and legs. 04/01/21   [provider]  TRULICITY 0.75 MG/0.5ML SOPN Inject 0.75 mg into the skin once a week. Sunday 04/23/22   [provider]  vitamin B-12 (CYANOCOBALAMIN) 500 MCG tablet Take 500 mcg by mouth daily. Unknown dosage    [provider]    Allergies: Chlorzoxazone, Piroxicam, Latex, and Adhesive [tape]    Review of Systems  Updated Vital Signs BP (!) 162/78   Pulse 95   Temp 99 F (37.2 C)   Resp 18   SpO2 95%   Physical Exam  (all labs ordered are listed, but only abnormal results are displayed) Labs Reviewed  RESP PANEL BY RT-PCR (RSV, FLU A&B, COVID)  [id number]  COMPREHENSIVE METABOLIC PANEL WITH GFR  CBC  URINALYSIS, ROUTINE W REFLEX MICROSCOPIC  CBG MONITORING, ED    EKG: None  Radiology: No results found.  {Document cardiac monitor, telemetry assessment procedure when appropriate:32947} Procedures   Medications Ordered in the ED - No data to display    {Click here for ABCD2, HEART and other calculators REFRESH Note before signing:1}                              Medical Decision Making Amount and/or Complexity of Data Reviewed Labs: ordered. Radiology: ordered.   ***  {Document critical care time when appropriate  Document review of labs and clinical decision tools ie CHADS2VASC2, etc  Document your independent review of radiology images and any outside records  Document your discussion with family members, caretakers and  with consultants  Document social determinants of health affecting pt's care  Document your decision making why or why not admission, treatments were needed:32947:::1}   Final diagnoses:  None    ED Discharge Orders     None        "

## 2024-03-12 ENCOUNTER — Emergency Department (HOSPITAL_COMMUNITY)

## 2024-03-12 DIAGNOSIS — E7800 Pure hypercholesterolemia, unspecified: Secondary | ICD-10-CM | POA: Diagnosis present

## 2024-03-12 DIAGNOSIS — I480 Paroxysmal atrial fibrillation: Secondary | ICD-10-CM | POA: Diagnosis present

## 2024-03-12 DIAGNOSIS — E1122 Type 2 diabetes mellitus with diabetic chronic kidney disease: Secondary | ICD-10-CM | POA: Diagnosis present

## 2024-03-12 DIAGNOSIS — I739 Peripheral vascular disease, unspecified: Secondary | ICD-10-CM | POA: Diagnosis not present

## 2024-03-12 DIAGNOSIS — Z794 Long term (current) use of insulin: Secondary | ICD-10-CM | POA: Diagnosis not present

## 2024-03-12 DIAGNOSIS — J029 Acute pharyngitis, unspecified: Secondary | ICD-10-CM | POA: Diagnosis present

## 2024-03-12 DIAGNOSIS — A419 Sepsis, unspecified organism: Secondary | ICD-10-CM | POA: Diagnosis present

## 2024-03-12 DIAGNOSIS — E872 Acidosis, unspecified: Secondary | ICD-10-CM | POA: Diagnosis present

## 2024-03-12 DIAGNOSIS — E1151 Type 2 diabetes mellitus with diabetic peripheral angiopathy without gangrene: Secondary | ICD-10-CM | POA: Diagnosis present

## 2024-03-12 DIAGNOSIS — I252 Old myocardial infarction: Secondary | ICD-10-CM | POA: Diagnosis not present

## 2024-03-12 DIAGNOSIS — G4733 Obstructive sleep apnea (adult) (pediatric): Secondary | ICD-10-CM | POA: Diagnosis present

## 2024-03-12 DIAGNOSIS — L0390 Cellulitis, unspecified: Secondary | ICD-10-CM | POA: Diagnosis present

## 2024-03-12 DIAGNOSIS — R509 Fever, unspecified: Secondary | ICD-10-CM | POA: Diagnosis present

## 2024-03-12 DIAGNOSIS — I872 Venous insufficiency (chronic) (peripheral): Secondary | ICD-10-CM | POA: Diagnosis present

## 2024-03-12 DIAGNOSIS — E1165 Type 2 diabetes mellitus with hyperglycemia: Secondary | ICD-10-CM | POA: Diagnosis present

## 2024-03-12 DIAGNOSIS — K760 Fatty (change of) liver, not elsewhere classified: Secondary | ICD-10-CM | POA: Diagnosis present

## 2024-03-12 DIAGNOSIS — G894 Chronic pain syndrome: Secondary | ICD-10-CM | POA: Diagnosis present

## 2024-03-12 DIAGNOSIS — R6510 Systemic inflammatory response syndrome (SIRS) of non-infectious origin without acute organ dysfunction: Secondary | ICD-10-CM | POA: Diagnosis present

## 2024-03-12 DIAGNOSIS — N1832 Chronic kidney disease, stage 3b: Secondary | ICD-10-CM | POA: Diagnosis not present

## 2024-03-12 DIAGNOSIS — I5032 Chronic diastolic (congestive) heart failure: Secondary | ICD-10-CM | POA: Diagnosis present

## 2024-03-12 DIAGNOSIS — Z7902 Long term (current) use of antithrombotics/antiplatelets: Secondary | ICD-10-CM | POA: Diagnosis not present

## 2024-03-12 DIAGNOSIS — Z1152 Encounter for screening for COVID-19: Secondary | ICD-10-CM | POA: Diagnosis not present

## 2024-03-12 DIAGNOSIS — I10 Essential (primary) hypertension: Secondary | ICD-10-CM | POA: Diagnosis not present

## 2024-03-12 DIAGNOSIS — E66813 Obesity, class 3: Secondary | ICD-10-CM | POA: Diagnosis present

## 2024-03-12 DIAGNOSIS — Z6841 Body Mass Index (BMI) 40.0 and over, adult: Secondary | ICD-10-CM | POA: Diagnosis not present

## 2024-03-12 DIAGNOSIS — E1142 Type 2 diabetes mellitus with diabetic polyneuropathy: Secondary | ICD-10-CM | POA: Diagnosis present

## 2024-03-12 DIAGNOSIS — I2510 Atherosclerotic heart disease of native coronary artery without angina pectoris: Secondary | ICD-10-CM | POA: Diagnosis present

## 2024-03-12 DIAGNOSIS — I110 Hypertensive heart disease with heart failure: Secondary | ICD-10-CM | POA: Diagnosis present

## 2024-03-12 DIAGNOSIS — Z7901 Long term (current) use of anticoagulants: Secondary | ICD-10-CM | POA: Diagnosis not present

## 2024-03-12 LAB — URINALYSIS, ROUTINE W REFLEX MICROSCOPIC
Bilirubin Urine: NEGATIVE
Glucose, UA: >=500 mg/dL — AB
Ketones, ur: 5 mg/dL — AB
Leukocytes,Ua: NEGATIVE
Nitrite: NEGATIVE
Protein, ur: 100 mg/dL — AB
Specific Gravity, Urine: 1.035 — ABNORMAL HIGH (ref 1.005–1.030)
pH: 5 (ref 5.0–8.0)

## 2024-03-12 LAB — BASIC METABOLIC PANEL WITH GFR
Anion gap: 13 (ref 5–15)
BUN: 17 mg/dL (ref 8–23)
CO2: 25 mmol/L (ref 22–32)
Calcium: 9.8 mg/dL (ref 8.9–10.3)
Chloride: 102 mmol/L (ref 98–111)
Creatinine, Ser: 1.07 mg/dL (ref 0.61–1.24)
GFR, Estimated: >60 mL/min
Glucose, Bld: 202 mg/dL — ABNORMAL HIGH (ref 70–99)
Potassium: 3.9 mmol/L (ref 3.5–5.1)
Sodium: 140 mmol/L (ref 135–145)

## 2024-03-12 LAB — HEMOGLOBIN A1C
Hgb A1c MFr Bld: 7.5 % — ABNORMAL HIGH (ref 4.8–5.6)
Mean Plasma Glucose: 168.55 mg/dL

## 2024-03-12 LAB — CBC
HCT: 45.9 % (ref 39.0–52.0)
HCT: 47.9 % (ref 39.0–52.0)
Hemoglobin: 15 g/dL (ref 13.0–17.0)
Hemoglobin: 15.7 g/dL (ref 13.0–17.0)
MCH: 30.9 pg (ref 26.0–34.0)
MCH: 31.2 pg (ref 26.0–34.0)
MCHC: 32.7 g/dL (ref 30.0–36.0)
MCHC: 32.8 g/dL (ref 30.0–36.0)
MCV: 94.3 fL (ref 80.0–100.0)
MCV: 95.4 fL (ref 80.0–100.0)
Platelets: 191 K/uL (ref 150–400)
Platelets: 200 K/uL (ref 150–400)
RBC: 4.81 MIL/uL (ref 4.22–5.81)
RBC: 5.08 MIL/uL (ref 4.22–5.81)
RDW: 14.1 % (ref 11.5–15.5)
RDW: 14.3 % (ref 11.5–15.5)
WBC: 12.9 K/uL — ABNORMAL HIGH (ref 4.0–10.5)
WBC: 13.6 K/uL — ABNORMAL HIGH (ref 4.0–10.5)
nRBC: 0 % (ref 0.0–0.2)
nRBC: 0 % (ref 0.0–0.2)

## 2024-03-12 LAB — COMPREHENSIVE METABOLIC PANEL WITH GFR
ALT: 22 U/L (ref 0–44)
AST: 45 U/L — ABNORMAL HIGH (ref 15–41)
Albumin: 4 g/dL (ref 3.5–5.0)
Alkaline Phosphatase: 65 U/L (ref 38–126)
Anion gap: 14 (ref 5–15)
BUN: 18 mg/dL (ref 8–23)
CO2: 26 mmol/L (ref 22–32)
Calcium: 10.2 mg/dL (ref 8.9–10.3)
Chloride: 100 mmol/L (ref 98–111)
Creatinine, Ser: 1.21 mg/dL (ref 0.61–1.24)
GFR, Estimated: >60 mL/min
Glucose, Bld: 198 mg/dL — ABNORMAL HIGH (ref 70–99)
Potassium: 3.8 mmol/L (ref 3.5–5.1)
Sodium: 140 mmol/L (ref 135–145)
Total Bilirubin: 1.4 mg/dL — ABNORMAL HIGH (ref 0.0–1.2)
Total Protein: 8.1 g/dL (ref 6.5–8.1)

## 2024-03-12 LAB — PROCALCITONIN: Procalcitonin: 0.6 ng/mL

## 2024-03-12 LAB — CBG MONITORING, ED
Glucose-Capillary: 184 mg/dL — ABNORMAL HIGH (ref 70–99)
Glucose-Capillary: 216 mg/dL — ABNORMAL HIGH (ref 70–99)
Glucose-Capillary: 246 mg/dL — ABNORMAL HIGH (ref 70–99)
Glucose-Capillary: 206 mg/dL — ABNORMAL HIGH (ref 70–99)

## 2024-03-12 LAB — RESP PANEL BY RT-PCR (RSV, FLU A&B, COVID)  RVPGX2
Influenza A by PCR: NEGATIVE
Influenza B by PCR: NEGATIVE
Resp Syncytial Virus by PCR: NEGATIVE
SARS Coronavirus 2 by RT PCR: NEGATIVE

## 2024-03-12 LAB — LACTIC ACID, PLASMA: Lactic Acid, Venous: 1.5 mmol/L (ref 0.5–1.9)

## 2024-03-12 LAB — TROPONIN T, HIGH SENSITIVITY
Troponin T High Sensitivity: 24 ng/L — ABNORMAL HIGH (ref 0–19)
Troponin T High Sensitivity: 24 ng/L — ABNORMAL HIGH (ref 0–19)

## 2024-03-12 LAB — I-STAT CG4 LACTIC ACID, ED: Lactic Acid, Venous: 2.2 mmol/L (ref 0.5–1.9)

## 2024-03-12 MED ORDER — EZETIMIBE 10 MG PO TABS
10.0000 mg | ORAL_TABLET | Freq: Every day | ORAL | Status: DC
  Administered 2024-03-12 – 2024-03-15 (×4): 10 mg via ORAL
  Filled 2024-03-12: qty 1

## 2024-03-12 MED ORDER — DAPAGLIFLOZIN PROPANEDIOL 10 MG PO TABS
10.0000 mg | ORAL_TABLET | Freq: Every day | ORAL | Status: DC
  Administered 2024-03-13 – 2024-03-15 (×3): 10 mg via ORAL

## 2024-03-12 MED ORDER — VANCOMYCIN HCL 2000 MG/400ML IV SOLN
2000.0000 mg | Freq: Once | INTRAVENOUS | Status: AC
  Administered 2024-03-12: 2000 mg via INTRAVENOUS
  Filled 2024-03-12: qty 400

## 2024-03-12 MED ORDER — LACTATED RINGERS IV BOLUS
1000.0000 mL | Freq: Once | INTRAVENOUS | Status: AC
  Administered 2024-03-12: 1000 mL via INTRAVENOUS

## 2024-03-12 MED ORDER — CARVEDILOL 12.5 MG PO TABS
12.5000 mg | ORAL_TABLET | Freq: Two times a day (BID) | ORAL | Status: DC
  Administered 2024-03-12 – 2024-03-15 (×6): 12.5 mg via ORAL
  Filled 2024-03-12: qty 1

## 2024-03-12 MED ORDER — ONDANSETRON HCL 4 MG/2ML IJ SOLN: 4.0000 mg | Freq: Four times a day (QID) | INTRAMUSCULAR | Status: DC | PRN

## 2024-03-12 MED ORDER — ATORVASTATIN CALCIUM 40 MG PO TABS
80.0000 mg | ORAL_TABLET | Freq: Every day | ORAL | Status: DC
  Administered 2024-03-12 – 2024-03-15 (×4): 80 mg via ORAL
  Filled 2024-03-12: qty 2

## 2024-03-12 MED ORDER — AMLODIPINE BESYLATE 10 MG PO TABS
10.0000 mg | ORAL_TABLET | Freq: Every morning | ORAL | Status: DC
  Administered 2024-03-13 – 2024-03-15 (×3): 10 mg via ORAL

## 2024-03-12 MED ORDER — LACTATED RINGERS IV SOLN: INTRAVENOUS | Status: AC

## 2024-03-12 MED ORDER — ONDANSETRON HCL 4 MG PO TABS: 4.0000 mg | ORAL_TABLET | Freq: Four times a day (QID) | ORAL | Status: DC | PRN

## 2024-03-12 MED ORDER — IOHEXOL 350 MG/ML SOLN
75.0000 mL | Freq: Once | INTRAVENOUS | Status: AC | PRN
  Administered 2024-03-12: 75 mL via INTRAVENOUS

## 2024-03-12 MED ORDER — TAMSULOSIN HCL 0.4 MG PO CAPS
0.4000 mg | ORAL_CAPSULE | Freq: Every day | ORAL | Status: DC
  Administered 2024-03-12 – 2024-03-15 (×4): 0.4 mg via ORAL
  Filled 2024-03-12: qty 1

## 2024-03-12 MED ORDER — PANTOPRAZOLE SODIUM 40 MG PO TBEC
40.0000 mg | DELAYED_RELEASE_TABLET | Freq: Two times a day (BID) | ORAL | Status: DC
  Administered 2024-03-12 – 2024-03-15 (×7): 40 mg via ORAL
  Filled 2024-03-12 (×2): qty 1

## 2024-03-12 MED ORDER — APIXABAN 5 MG PO TABS
5.0000 mg | ORAL_TABLET | Freq: Two times a day (BID) | ORAL | Status: DC
  Administered 2024-03-12 – 2024-03-15 (×6): 5 mg via ORAL
  Filled 2024-03-12: qty 1

## 2024-03-12 MED ORDER — INSULIN ASPART 100 UNIT/ML IJ SOLN
0.0000 [IU] | Freq: Three times a day (TID) | INTRAMUSCULAR | Status: DC
  Administered 2024-03-12: 7 [IU] via SUBCUTANEOUS
  Administered 2024-03-13: 4 [IU] via SUBCUTANEOUS
  Administered 2024-03-13 (×2): 7 [IU] via SUBCUTANEOUS
  Administered 2024-03-14 (×3): 4 [IU] via SUBCUTANEOUS
  Administered 2024-03-15: 3 [IU] via SUBCUTANEOUS
  Filled 2024-03-12 (×2): qty 2

## 2024-03-12 MED ORDER — INSULIN ASPART 100 UNIT/ML IJ SOLN
0.0000 [IU] | Freq: Every day | INTRAMUSCULAR | Status: DC
  Administered 2024-03-12: 2 [IU] via SUBCUTANEOUS
  Filled 2024-03-12: qty 2

## 2024-03-12 MED ORDER — TRAMADOL HCL 50 MG PO TABS
50.0000 mg | ORAL_TABLET | Freq: Two times a day (BID) | ORAL | Status: DC | PRN
  Administered 2024-03-13 – 2024-03-15 (×3): 50 mg via ORAL

## 2024-03-12 MED ORDER — SODIUM CHLORIDE 0.9 % IV SOLN
2.0000 g | Freq: Three times a day (TID) | INTRAVENOUS | Status: DC
  Administered 2024-03-12 – 2024-03-14 (×7): 2 g via INTRAVENOUS
  Filled 2024-03-12 (×2): qty 12.5

## 2024-03-12 MED ORDER — ACETAMINOPHEN 500 MG PO TABS: 1000.0000 mg | ORAL_TABLET | Freq: Four times a day (QID) | ORAL | Status: DC | PRN

## 2024-03-12 MED ORDER — ACETAMINOPHEN 650 MG RE SUPP: 650.0000 mg | Freq: Four times a day (QID) | RECTAL | Status: DC | PRN

## 2024-03-12 MED ORDER — SODIUM CHLORIDE 0.9 % IV BOLUS
500.0000 mL | Freq: Once | INTRAVENOUS | Status: AC
  Administered 2024-03-12: 500 mL via INTRAVENOUS

## 2024-03-12 MED ORDER — HYDRALAZINE HCL 20 MG/ML IJ SOLN: 10.0000 mg | Freq: Three times a day (TID) | INTRAMUSCULAR | Status: DC | PRN

## 2024-03-12 MED ORDER — ASPIRIN 81 MG PO TBEC
81.0000 mg | DELAYED_RELEASE_TABLET | Freq: Every day | ORAL | Status: DC
  Administered 2024-03-12 – 2024-03-15 (×4): 81 mg via ORAL
  Filled 2024-03-12: qty 1

## 2024-03-12 MED ORDER — INSULIN ASPART 100 UNIT/ML IJ SOLN: 0.0000 [IU] | Freq: Every day | INTRAMUSCULAR | Status: DC

## 2024-03-12 MED ORDER — CEFAZOLIN SODIUM-DEXTROSE 2-4 GM/100ML-% IV SOLN
2.0000 g | Freq: Once | INTRAVENOUS | Status: AC
  Administered 2024-03-12: 2 g via INTRAVENOUS
  Filled 2024-03-12: qty 100

## 2024-03-12 MED ORDER — VANCOMYCIN HCL 2000 MG/400ML IV SOLN
2000.0000 mg | INTRAVENOUS | Status: DC
  Administered 2024-03-13 – 2024-03-14 (×2): 2000 mg via INTRAVENOUS
  Filled 2024-03-12: qty 400

## 2024-03-12 MED ORDER — INSULIN ASPART 100 UNIT/ML IJ SOLN
0.0000 [IU] | Freq: Three times a day (TID) | INTRAMUSCULAR | Status: DC
  Administered 2024-03-12: 1 [IU] via SUBCUTANEOUS
  Administered 2024-03-12: 2 [IU] via SUBCUTANEOUS
  Filled 2024-03-12: qty 2
  Filled 2024-03-12: qty 1

## 2024-03-12 NOTE — H&P (Signed)
 " History and Physical    Patient: Phillip Maldonado FMW:[id number] DOB: 1950-11-21 DOA: 03/11/2024 DOS: the patient was seen and examined on 03/12/2024 PCP: [name], NP  Patient coming from: Home  Chief Complaint:  Chief Complaint  Patient presents with   Fatigue   HPI: Phillip Maldonado is a 73 y.o. male with medical history significant of ADD, anxiety, depression, osteoarthritis, alcohol  abuse, history of hep B, history of fatty liver disease, unspecified anemia, CAD, history of angina, history of NSTEMI, paroxysmal atrial fibrillation, chronic diastolic heart failure, hypertension, type 2 diabetes, diabetic peripheral neuropathy, class III obesity, hyperlipidemia, cataracts, chronic fatigue syndrome, diverticulosis, history of pneumonia, obstructive sleep apnea who presented to the emergency department following a fall at home (slid off the couch and was unable to get up).  He has had fever, chills, generalized weakness, excessive thirst and fatigue for the past 3 days.  No travel history or sick contacts.  No rhinorrhea, sore throat, wheezing or hemoptysis.  No chest pain, palpitations, diaphoresis, PND, orthopnea or pitting edema of the lower extremities.  No abdominal pain, nausea, emesis, diarrhea, constipation, melena or hematochezia.  No flank pain, dysuria, frequency or hematuria.  No polyuria, polydipsia, polyphagia or blurred vision.   Lab work: Urinalysis shows specific gravity 1.035, glucose greater than 500, ketones 5 and protein of 100 mg/dL.  There was rare bacteria microscopic examination.  CBC showed white count 13.6, hemoglobin 15.7 g/dL and platelets 799.  Lactic acid is 2.2 x 2 and then 1.5 mmol/L.  Coronavirus, influenza and RSV PCR test were negative.  Troponin x 224 ng/L.  CMP showed an AST of 45, total bilirubin 1.4 and glucose 198 mg/dL, the rest of the CMP measurements were normal.  Imaging: Portable 1 view chest radiograph with no acute cardiopulmonary findings.   Mild left basilar atelectasis.  Mild cardiomegaly without evidence of CHF.  CTA chest with no evidence of PE to the lobar level, suboptimal arterial opacification of the segmental and subsegmental arteries due to motion artifact.  Mild cardiomegaly with heavily calcified coronary arteries and atherosclerosis of the aorta and great vessels, with slight prominence of the ascending aorta at 4 cm, but no dissection.  Annual imaging by CTA or MRA recommended.  Mildly cirrhotic liver mildly prominent spleen with upper abdominal structures not well-seen due to metallic and beam-hardening artifact.  ED course: Initial vital signs were temperature 99 F, but subsequently increased to 101 F, pulse 95, respiration 18, BP 162/78 mmHg O2 sat 95% on room air.  The patient received cefazolin  2 g IVPB x 1, vancomycin  2000 mg IVPB, LR 2000 mL liter bolus and normal saline 500 mL bolus.   Review of Systems: As mentioned in the history of present illness. All other systems reviewed and are negative. Past Medical History:  Diagnosis Date   ADD (attention deficit disorder)    Alcohol  abuse    Anemia    Angina    Anxiety    Arthritis    Atrial fibrillation with RVR (HCC) 01/24/2013   Back pain    Blood transfusion    last 10' 14- GI bleed   Cataract    CFS (chronic fatigue syndrome)    Chest pain    CHF (congestive heart failure) (HCC)    CKD (chronic kidney disease) stage 3, GFR 30-59 ml/min (HCC) 01/08/2016   Constipation    Depression    Diabetes mellitus (HCC) 09/18/2011   Diverticulosis    DJD (degenerative joint disease)  DM type 2, uncontrolled, 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   Edema extremities    bilateral lower extremities-weepy areas due to fluid retention   Edema of both lower extremities    Fatty liver    Fundic gland polyps of stomach, benign    Headache(784.0)    Hearing loss    Hepatitis B    History of alcohol  abuse    last drink in 1993   Hx of blood clots    Hyperlipemia     Hypertension    Joint pain    Myocardial infarct (HCC)    Neuromuscular disorder (HCC)    Neuropathy, 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 06-04-14 doesn't use much now, no mask/tubing now.   Stomach ulcer    Past Surgical History:  Procedure Laterality Date   ABDOMINAL SURGERY     [phone]   BACK SURGERY     x 8 -,multiple fusions(cervical to lumbar)   BILIARY STENT PLACEMENT  02/07/2019   Procedure: BILIARY STENT PLACEMENT;  Surgeon: Aneita Gwendlyn DASEN, MD;  Location: [facility] ENDOSCOPY;  Service: Endoscopy;;   CHOLECYSTECTOMY N/A 02/02/2019   Procedure: attempted LAPAROSCOPIC CHOLECYSTECTOMY;  Surgeon: Vernetta Berg, MD;  Location: [facility] OR;  Service: General;  Laterality: N/A;   CHOLECYSTECTOMY  02/02/2019   Procedure: Cholecystectomy;  Surgeon: Vernetta Berg, MD;  Location: [facility] OR;  Service: General;;   COLONOSCOPY     COLONOSCOPY WITH PROPOFOL  N/A 06/14/2014   Procedure: COLONOSCOPY WITH PROPOFOL ;  Surgeon: Lupita FORBES Commander, MD;  Location: [facility] ENDOSCOPY;  Service: Endoscopy;  Laterality: N/A;   CORONARY STENT INTERVENTION N/A 01/22/2022   Procedure: CORONARY STENT INTERVENTION;  Surgeon: Verlin Lonni BIRCH, MD;  Location: [facility] INVASIVE CV LAB;  Service: Cardiovascular;  Laterality: N/A;   DIAGNOSTIC LAPAROSCOPY     ERCP N/A 02/07/2019   Procedure: ENDOSCOPIC RETROGRADE CHOLANGIOPANCREATOGRAPHY (ERCP);  Surgeon: Aneita Gwendlyn DASEN, MD;  Location: [facility] ENDOSCOPY;  Service: Endoscopy;  Laterality: N/A;   ERCP N/A 04/07/2019   Procedure: ENDOSCOPIC RETROGRADE CHOLANGIOPANCREATOGRAPHY (ERCP);  Surgeon: Commander Lupita FORBES, MD;  Location: [facility]OSCOPY;  Service: Endoscopy;  Laterality: N/A;   ESOPHAGOGASTRODUODENOSCOPY N/A  01/03/2013   Procedure: ESOPHAGOGASTRODUODENOSCOPY (EGD);  Surgeon: Elsie Cree, MD;  Location: [facility] ORS;  Service: Endoscopy;  Laterality: N/A;   ESOPHAGOGASTRODUODENOSCOPY N/A 01/03/2013   Procedure: ESOPHAGOGASTRODUODENOSCOPY (EGD);  Surgeon: Elsie Cree, MD;  Location: [facility] ENDOSCOPY;  Service: Endoscopy;  Laterality: N/A;   ESOPHAGOGASTRODUODENOSCOPY N/A 01/23/2013   Procedure: ESOPHAGOGASTRODUODENOSCOPY (EGD);  Surgeon: Jerrell KYM Sol, MD;  Location: [facility] ENDOSCOPY;  Service: Endoscopy;  Laterality: N/A;   ESOPHAGOGASTRODUODENOSCOPY Left 01/27/2013   Procedure: ESOPHAGOGASTRODUODENOSCOPY (EGD);  Surgeon: Jerrell KYM Sol, MD;  Location: [facility] OR;  Service: Endoscopy;  Laterality: Left;   ESOPHAGOGASTRODUODENOSCOPY N/A 06/14/2014   Procedure: ESOPHAGOGASTRODUODENOSCOPY (EGD);  Surgeon: Lupita FORBES Commander, MD;  Location: [facility]COPY;  Service: Endoscopy;  Laterality: N/A;   ESOPHAGOSCOPY N/A 01/01/2013   Procedure: ESOPHAGOSCOPY;  Surgeon: Oliva FORBES Boots, MD;  Location: [facility] OR;  Service: Endoscopy;  Laterality: N/A;   GASTRIC BYPASS  1977   Reversed in 1992 and revision 1994   GASTROINTESTINAL STENT REMOVAL N/A 04/07/2019   Procedure: GASTROINTESTINAL STENT REMOVAL;  Surgeon: Commander Lupita FORBES, MD;  Location: [facility] ENDOSCOPY;  Service: Endoscopy;  Laterality: N/A;   HYDROCELE EXCISION  1996   x 2    KNEE SURGERY Left 2004   LEFT HEART CATH AND CORONARY ANGIOGRAPHY N/A 01/22/2022   Procedure: LEFT HEART CATH AND CORONARY ANGIOGRAPHY;  Surgeon: Verlin Lonni BIRCH, MD;  Location: [facility] INVASIVE CV LAB;  Service: Cardiovascular;  Laterality: N/A;   Social History:  reports that he has never smoked. He has never used smokeless tobacco. He reports that he does not drink alcohol  and does not use drugs.  Allergies[1]  Family History  Problem Relation Age of Onset   Arthritis Mother    Osteoporosis Mother    Diabetes Mother    High blood pressure Mother    High Cholesterol Mother    Depression  Mother    Anxiety disorder Mother    Sleep apnea Mother    Eating disorder Mother    Heart disease Father    Skin cancer Father    COPD Father    Hypertension Father    Stroke Father    High Cholesterol Father    High blood pressure Sister    High blood pressure Sister    Arthritis Sister    Arthritis Sister    Cancer Maternal Grandmother    Diabetes Daughter    Depression Son    High blood pressure Son    Depression Son    Heart disease Maternal Uncle    Heart disease Maternal Uncle    Heart disease Paternal Uncle     Prior to Admission medications  Medication Sig Start Date End Date Taking? Authorizing Provider  amLODipine  (NORVASC ) 10 MG tablet Take 1 tablet (10 mg total) by mouth in the morning. 08/05/22  Yes O'Neal, Darryle Ned, MD  apixaban  (ELIQUIS ) 5 MG TABS tablet Take 5 mg by mouth 2 (two) times daily. 01/29/24  Yes [provider]  atorvastatin  (LIPITOR ) 80 MG tablet Take 1 tablet (80 mg total) by mouth daily. 04/01/23  Yes O'Neal, Darryle Ned, MD  carvedilol  (COREG ) 12.5 MG tablet TAKE 1 TABLET BY MOUTH TWICE A DAY 09/14/23  Yes O'Neal, Darryle Ned, MD  dapagliflozin  propanediol (FARXIGA ) 10 MG TABS tablet TAKE 1 TABLET BY MOUTH EVERY DAY 08/23/23  Yes O'Neal, Darryle Ned, MD  ezetimibe  (ZETIA ) 10 MG tablet Take 10 mg by mouth daily. 01/29/24 01/28/25 Yes [provider]  MOUNJARO 5 MG/0.5ML Pen Inject 5 mg into the skin once a week. 03/08/24  Yes [provider]  silodosin (RAPAFLO) 8 MG CAPS capsule Take 8 mg by mouth daily. 02/24/24  Yes [provider]  traMADol  (ULTRAM ) 50 MG tablet Take 50 mg by mouth 2 (two) times daily as needed. 02/09/24  Yes [provider]  acetaminophen  (TYLENOL ) 500 MG tablet Take 1,000 mg by mouth in the morning and at bedtime.    [provider]  aspirin  EC 81 MG tablet Take 1 tablet (81 mg total) by mouth daily. Swallow whole. Patient taking differently: Take 81 mg by mouth in the  morning and at bedtime. Swallow whole. 01/24/22   Zhao, Xika, NP  cholecalciferol (VITAMIN D3) 25 MCG (1000 UNIT) tablet Take 1,000 Units by mouth daily.    [provider]  docusate sodium  (COLACE) 100 MG capsule Take 100 mg by mouth 2 (two) times daily as needed for mild constipation.    [provider]  DULoxetine  (CYMBALTA ) 60 MG capsule Take 1 capsule (60 mg total) by mouth daily. 02/11/19 01/22/23  Kriste,  Jared E, DO  glucose blood (ACCU-CHEK AVIVA PLUS) test strip 1 each by Other route See admin instructions. Check blood sugar twice daily    [provider]  HUMALOG KWIKPEN 100 UNIT/ML KwikPen Inject 30 Units into the skin at bedtime. 03/13/22   [provider]  icosapent  Ethyl (VASCEPA ) 1 g capsule TAKE 2 CAPSULES TWICE A DAY 02/23/23   O'Neal, Darryle Ned, MD  metFORMIN  (GLUCOPHAGE ) 1000 MG tablet Take 1,000 mg by mouth 2 (two) times daily.    [provider]  Multiple Vitamins-Minerals (CENTRUM SILVER  50+MEN) TABS Take 1 tablet by mouth daily.    [provider]  pantoprazole  (PROTONIX ) 40 MG tablet Take 40 mg by mouth 2 (two) times daily.    [provider]  Polyethyl Glycol-Propyl Glycol (LUBRICANT EYE DROPS) 0.4-0.3 % SOLN Place 1 drop into both eyes 3 (three) times daily as needed (dry/irritated eyes.).    [provider]  potassium chloride  (KLOR-CON  M) 10 MEQ tablet TAKE 1 TABLET(10 MEQ) BY MOUTH TWICE DAILY 10/16/22   O'Neal, Darryle Ned, MD  potassium chloride  (KLOR-CON ) 10 MEQ tablet TAKE 1 TABLET BY MOUTH TWICE A DAY 10/18/23   O'Neal, Darryle Ned, MD  pregabalin  (LYRICA ) 200 MG capsule Take 200 mg by mouth 3 (three) times daily.    [provider]  sacubitril -valsartan  (ENTRESTO ) 24-26 MG TAKE 1 TABLET BY MOUTH TWICE DAILY 12/11/22   O'Neal, Darryle Ned, MD  sildenafil  (VIAGRA ) 50 MG tablet TAKE 1 TABLET BY MOUTH EVERY DAY AS NEEDED ERECTILE DYSFUNCTION 09/27/23   O'Neal, Darryle Ned, MD  silver   sulfADIAZINE  (SILVADENE ) 1 % cream Apply 1 Application topically 2 (two) times daily as needed (skin irritation). Mix at home 1:1 with triamcinolone  and apply 2 times a day to upper back, chest, under breast, groin areas and legs. 04/23/20   [provider]  spironolactone  (ALDACTONE ) 25 MG tablet Take 1 tablet (25 mg total) by mouth daily. NEED OV LAST ATTEMPT. 11/04/23   O'Neal, Darryle Ned, MD  tamsulosin  (FLOMAX ) 0.4 MG CAPS capsule Take 1 capsule (0.4 mg total) by mouth daily. 07/17/22   Ghimire, Donalda HERO, MD  ticagrelor  (BRILINTA ) 90 MG TABS tablet Take 1 tablet (90 mg total) by mouth 2 (two) times daily. 10/01/23   Barbaraann Darryle Ned, MD  torsemide  (DEMADEX ) 10 MG tablet TAKE 5 TABLETS BY MOUTH TWICE DAILY 09/21/23   O'Neal, Darryle Ned, MD  traZODone  (DESYREL ) 300 MG tablet Take 1 tablet (300 mg total) by mouth at bedtime. 02/10/19 01/22/23  Segal, Jared E, DO  triamcinolone  cream (KENALOG ) 0.1 % Apply 1 Application topically 2 (two) times daily as needed (skin irritation.). Mix at home 1:1 with triamcinolone  and apply 2 times a day to upper back, chest, under breast, groin areas and legs. 04/01/21   [provider]  TRULICITY 0.75 MG/0.5ML SOPN Inject 0.75 mg into the skin once a week. Sunday 04/23/22   [provider]  vitamin B-12 (CYANOCOBALAMIN) 500 MCG tablet Take 500 mcg by mouth daily. Unknown dosage    [provider]    Physical Exam: Vitals:   03/12/24 0400 03/12/24 0423 03/12/24 0500 03/12/24 0530  BP:   (!) 161/93 (!) 142/106  Pulse:   87 90  Resp:   14 14  Temp:  98.7 F (37.1 C)    TempSrc:      SpO2:   93% 95%  Weight: (!) 170 kg     Height: 5' 6 (1.676 m)  Physical Exam Vitals and nursing note reviewed.  Constitutional:      Appearance: He is ill-appearing.  HENT:     Head: Normocephalic.     Nose: No rhinorrhea.     Mouth/Throat:     Mouth: Mucous membranes are moist.  Eyes:     General: No scleral icterus.    Pupils:  Pupils are equal, round, and reactive to light.  Cardiovascular:     Rate and Rhythm: Normal rate and regular rhythm.  Pulmonary:     Effort: Pulmonary effort is normal.     Breath sounds: Normal breath sounds. No wheezing, rhonchi or rales.  Abdominal:     General: Bowel sounds are normal. There is no distension.     Palpations: Abdomen is soft.     Tenderness: There is no abdominal tenderness. There is no right CVA tenderness or left CVA tenderness.  Musculoskeletal:     Cervical back: Neck supple.     Right lower leg: No edema.     Left lower leg: No edema.  Skin:    General: Skin is warm and dry.  Neurological:     General: No focal deficit present.     Mental Status: He is alert and oriented to person, place, and time.  Psychiatric:        Mood and Affect: Mood normal.        Behavior: Behavior normal.        Data Reviewed:  Results are pending, will review when available. 07/15/2022 TTE report. IMPRESSIONS:   1. Left ventricular ejection fraction, by estimation, is 60 to 65%. The  left ventricle has normal function. The left ventricle has no regional  wall motion abnormalities. Left ventricular diastolic parameters are  indeterminate.   2. Right ventricular systolic function is normal. The right ventricular  size is moderately enlarged. Tricuspid regurgitation signal is inadequate  for assessing PA pressure.   3. The mitral valve was not well visualized. No evidence of mitral valve  regurgitation.   4. The aortic valve is tricuspid. Aortic valve regurgitation is not  visualized. No aortic stenosis is present.   5. Aortic dilatation noted. There is mild dilatation of the aortic root,  measuring 41 mm.   Comparison(s): Prior images reviewed side by side. LVEF is better  visualized from prior study.   EKG: Vent. rate 95 BPM  PR interval 219 ms  QRS duration 157 ms  QT/QTcB 385/484 ms  P-R-T axes -19 -72 -1  Sinus rhythm  Prolonged PR interval  Assessment  and Plan: Principal Problem:   Sepsis due to undetermined organism [facility]) Initially thought to have cellulitis. -Symptoms resemble more viral syndrome.   Admit to PCU/inpatient. Continue IV fluids. Continue cefepime  2 g every 8 hours.   Continue vancomycin  per pharmacy. Follow-up blood culture and sensitivity Follow CBC and CMP in a.m.  Active Problems:   PAF (paroxysmal atrial fibrillation) (HCC)  Continue apixaban  5 mg p.o. twice daily.    PVD (peripheral vascular disease)  On DOAC and antiplatelet therapy. Continue ezetimibe  and atorvastatin .    HTN (hypertension) Continue amlodipine  10 mg p.o. daily.    OSA (obstructive sleep apnea) CPAP at bedtime.    Chronic pain syndrome Continue tramadol  50 mg p.o. twice daily.    Diabetes mellitus (HCC) Carbohydrate modified diet. CBG monitoring with RI SS. Check hemoglobin A1c.    Hypercholesterolemia Continue atorvastatin  80 mg p.o. daily. Continue ezetimibe  10 mg p.o. daily.    Class 3 severe obesity with  serious comorbidity  and body mass index (BMI) of 60.0 to 69.9 in adult [facility]) Current BMI 60.49 Kg/m. Would benefit from lifestyle modifications. Follow-up closely with PCP and/or bariatric clinic.    CKD (chronic kidney disease) stage 3a, GFR 30-59 ml/min (HCC) GFR is normal at this time. Monitor renal function and electrolytes.    Chronic diastolic (congestive) heart failure (HCC) No signs of decompensation.    Advance Care Planning:   Code Status: Full Code   Consults:   Family Communication:   Severity of Illness: The appropriate patient status for this patient is INPATIENT. Inpatient status is judged to be reasonable and necessary in order to provide the required intensity of service to ensure the patient's safety. The patient's presenting symptoms, physical exam findings, and initial radiographic and laboratory data in the context of their chronic comorbidities is felt to place them at high risk for  further clinical deterioration. Furthermore, it is not anticipated that the patient will be medically stable for discharge from the hospital within 2 midnights of admission.   * I certify that at the point of admission it is my clinical judgment that the patient will require inpatient hospital care spanning beyond 2 midnights from the point of admission due to high intensity of service, high risk for further deterioration and high frequency of surveillance required.*  Author: Alm Dorn Castor, MD 03/12/2024 7:59 AM  For on call review [email or web address].   This document was prepared using Dragon voice recognition software and may contain some unintended transcription errors.     [1]  Allergies Allergen Reactions   Chlorzoxazone Other (See Comments)    Other reaction(s): Angioedema (ALLERGY/intolerance)   Piroxicam Shortness Of Breath and Swelling    Tongue   Latex Other (See Comments) and Rash    Rash and itching Other reaction(s): Other (See Comments) Burning Feeling   Adhesive [Tape] Other (See Comments)    welts Unknown paper tape ok to use    "

## 2024-03-12 NOTE — Hospital Course (Signed)
" °  73 year old man history of chronic headache, laminectomy of the cervical vertebrae, polyneuropathy, chronic bilateral shoulder pain, generalized anxiety disorder, CAD, non-insulin -dependent DM type II DM type II presented to emergency department complaining of fall-from the couch tonight, generalized weakness fever and excessive contrast for 3 days.He presents to the emergency department by EMS from home following a fall at home. He slid off the couch and his wife could not get him up. He states that he has been feeling unwell with fever to 101 at home, generalized weakness, excessive thirst for 3 days. He does report that he is having some right-sided chest pain as well. No injuries from the fall. Unsure his medications or medical problems but believes he is on a blood thinner.   At presentation to EDpatient found febrile and borderline hypertensive. Lab, CBC showed leukocytosis 13.6.  CMP unremarkable except blood glucose 198.  UA positive ketone rare bacteria no evidence of UTI.  Pending respiratory panel.  Blood cultures are in process.  Flat troponin.  Elevated lactic acid 2.2.  In the ED patient received 500 mL of NS bolus and cefazolin  2 g.  Initial concern for cellulitis of right lower extremity however repeat physical exam did not reveal evidence for cellulitis.  At this time there is no source of infection has been identified.  Hospitalist consulted for further evaluation management of sepsis unknown source of infection and generalized/global weakness.     "

## 2024-03-12 NOTE — Progress Notes (Signed)
 Pharmacy Antibiotic Note  Phillip Maldonado is a 73 y.o. male admitted on 03/11/2024 with sepsis.  Pharmacy has been consulted for vancomycin  dosing.  Plan: Vancomycin  2gm IV x 1 then 2gm q24h (AUC 517.5, Scr 1.2) Follow renal function and clinical course  Height: 5' 6 (167.6 cm) Weight: (!) 170 kg (374 lb 12.5 oz) IBW/kg (Calculated) : 63.8  Temp (24hrs), Avg:100 F (37.8 C), Min:99 F (37.2 C), Max:101 F (38.3 C)  Recent Labs  Lab 03/11/24 0011 03/11/24 2349 03/11/24 2350 03/12/24 0231  WBC 13.6*  --   --   --   CREATININE 1.21  --  1.20  --   LATICACIDVEN  --  2.2*  --  2.2*    Estimated Creatinine Clearance: 82.4 mL/min (by C-G formula based on SCr of 1.2 mg/dL).    Allergies[1]  Antimicrobials this admission: 12/28 cefazolin  x 1 12/28 vanc >> 12/28 cefepime  >>  Dose adjustments this admission:   Microbiology results: 12/28 BCx:   Thank you for allowing pharmacy to be a part of this patients care.  Leeroy Mace RPh 03/12/2024, 4:20 AM     [1]  Allergies Allergen Reactions   Chlorzoxazone Other (See Comments)    Other reaction(s): Angioedema (ALLERGY/intolerance)   Piroxicam Shortness Of Breath and Swelling    Tongue   Latex Other (See Comments) and Rash    Rash and itching Other reaction(s): Other (See Comments) Burning Feeling   Adhesive [Tape] Other (See Comments)    welts Unknown paper tape ok to use

## 2024-03-13 DIAGNOSIS — G4733 Obstructive sleep apnea (adult) (pediatric): Secondary | ICD-10-CM

## 2024-03-13 DIAGNOSIS — I5032 Chronic diastolic (congestive) heart failure: Secondary | ICD-10-CM

## 2024-03-13 DIAGNOSIS — E1122 Type 2 diabetes mellitus with diabetic chronic kidney disease: Secondary | ICD-10-CM | POA: Diagnosis not present

## 2024-03-13 DIAGNOSIS — I10 Essential (primary) hypertension: Secondary | ICD-10-CM

## 2024-03-13 DIAGNOSIS — N1832 Chronic kidney disease, stage 3b: Secondary | ICD-10-CM | POA: Diagnosis not present

## 2024-03-13 DIAGNOSIS — Z794 Long term (current) use of insulin: Secondary | ICD-10-CM

## 2024-03-13 DIAGNOSIS — I480 Paroxysmal atrial fibrillation: Secondary | ICD-10-CM | POA: Diagnosis not present

## 2024-03-13 DIAGNOSIS — Z6841 Body Mass Index (BMI) 40.0 and over, adult: Secondary | ICD-10-CM | POA: Diagnosis not present

## 2024-03-13 DIAGNOSIS — E66813 Obesity, class 3: Secondary | ICD-10-CM

## 2024-03-13 DIAGNOSIS — G894 Chronic pain syndrome: Secondary | ICD-10-CM

## 2024-03-13 DIAGNOSIS — I739 Peripheral vascular disease, unspecified: Secondary | ICD-10-CM | POA: Diagnosis not present

## 2024-03-13 DIAGNOSIS — A419 Sepsis, unspecified organism: Secondary | ICD-10-CM | POA: Diagnosis not present

## 2024-03-13 LAB — COMPREHENSIVE METABOLIC PANEL WITH GFR
ALT: 19 U/L (ref 0–44)
AST: 62 U/L — ABNORMAL HIGH (ref 15–41)
Albumin: 3.3 g/dL — ABNORMAL LOW (ref 3.5–5.0)
Alkaline Phosphatase: 61 U/L (ref 38–126)
Anion gap: 12 (ref 5–15)
BUN: 17 mg/dL (ref 8–23)
CO2: 23 mmol/L (ref 22–32)
Calcium: 9.6 mg/dL (ref 8.9–10.3)
Chloride: 103 mmol/L (ref 98–111)
Creatinine, Ser: 1.13 mg/dL (ref 0.61–1.24)
GFR, Estimated: >60 mL/min
Glucose, Bld: 210 mg/dL — ABNORMAL HIGH (ref 70–99)
Potassium: 3.9 mmol/L (ref 3.5–5.1)
Sodium: 138 mmol/L (ref 135–145)
Total Bilirubin: 1.3 mg/dL — ABNORMAL HIGH (ref 0.0–1.2)
Total Protein: 7.2 g/dL (ref 6.5–8.1)

## 2024-03-13 LAB — CBC
HCT: 43.1 % (ref 39.0–52.0)
Hemoglobin: 14.1 g/dL (ref 13.0–17.0)
MCH: 31.6 pg (ref 26.0–34.0)
MCHC: 32.7 g/dL (ref 30.0–36.0)
MCV: 96.6 fL (ref 80.0–100.0)
Platelets: 201 K/uL (ref 150–400)
RBC: 4.46 MIL/uL (ref 4.22–5.81)
RDW: 14.3 % (ref 11.5–15.5)
WBC: 16 K/uL — ABNORMAL HIGH (ref 4.0–10.5)
nRBC: 0 % (ref 0.0–0.2)

## 2024-03-13 LAB — RESPIRATORY PANEL BY PCR

## 2024-03-13 LAB — GLUCOSE, CAPILLARY: Glucose-Capillary: 144 mg/dL — ABNORMAL HIGH (ref 70–99)

## 2024-03-13 LAB — CBG MONITORING, ED
Glucose-Capillary: 198 mg/dL — ABNORMAL HIGH (ref 70–99)
Glucose-Capillary: 220 mg/dL — ABNORMAL HIGH (ref 70–99)

## 2024-03-13 LAB — CK: Total CK: 870 U/L — ABNORMAL HIGH (ref 49–397)

## 2024-03-13 MED ORDER — POLYETHYLENE GLYCOL 3350 17 G PO PACK: 17.0000 g | PACK | Freq: Two times a day (BID) | ORAL | Status: DC | PRN

## 2024-03-13 MED ORDER — SENNOSIDES-DOCUSATE SODIUM 8.6-50 MG PO TABS: 2.0000 | ORAL_TABLET | Freq: Two times a day (BID) | ORAL | Status: DC | PRN

## 2024-03-13 MED ORDER — CHLORHEXIDINE GLUCONATE CLOTH 2 % EX PADS
6.0000 | MEDICATED_PAD | Freq: Every day | CUTANEOUS | Status: DC
  Administered 2024-03-14: 6 via TOPICAL

## 2024-03-13 MED ORDER — PREGABALIN 50 MG PO CAPS
100.0000 mg | ORAL_CAPSULE | Freq: Three times a day (TID) | ORAL | Status: DC
  Administered 2024-03-13 – 2024-03-15 (×5): 100 mg via ORAL
  Filled 2024-03-13 (×5): qty 2

## 2024-03-13 MED ORDER — DULOXETINE HCL 20 MG PO CPEP
60.0000 mg | ORAL_CAPSULE | Freq: Every day | ORAL | Status: DC
  Administered 2024-03-13 – 2024-03-15 (×3): 60 mg via ORAL
  Filled 2024-03-13 (×3): qty 3

## 2024-03-13 MED ORDER — TRAZODONE HCL 50 MG PO TABS
150.0000 mg | ORAL_TABLET | Freq: Every day | ORAL | Status: DC
  Administered 2024-03-13 – 2024-03-14 (×2): 150 mg via ORAL
  Filled 2024-03-13 (×2): qty 1

## 2024-03-13 MED ORDER — INSULIN GLARGINE 100 UNIT/ML ~~LOC~~ SOLN
20.0000 [IU] | Freq: Every day | SUBCUTANEOUS | Status: DC
  Administered 2024-03-13 – 2024-03-15 (×3): 20 [IU] via SUBCUTANEOUS
  Filled 2024-03-13 (×4): qty 0.2

## 2024-03-13 NOTE — Progress Notes (Signed)
 PT Cancellation Note  Patient Details Name: Phillip Maldonado MRN: [id number] DOB: 10-02-1950   Cancelled Treatment:    Reason Eval/Treat Not Completed: Other (comment) Pt just got to room and nursing present getting pt settled.  Pt reports tired.  Helped nursing reposition pt.  Will likely need +2 for mobility. Benjiman, PT Acute Rehab [facility] [phone]   Benjiman VEAR Mulberry 03/13/2024, 5:30 PM

## 2024-03-13 NOTE — Progress Notes (Signed)
 " PROGRESS NOTE  Phillip Maldonado FMW:[id number] DOB: February 09, 1951   PCP: Ariwodo, Phillip U, NP  Patient is from: Home.  Lives with his wife who has dementia.  DOA: 03/11/2024 LOS: 1  Chief complaints Chief Complaint  Patient presents with   Fatigue     Brief Narrative / Interim history: 73 year old M with PMH of CAD, HFpEF, A-fib, DM-2, HTN, OSA, morbid obesity, CKD-3A, PVD, anxiety, depression, PTSD and chronic fatigue syndrome presented to ED with fever, weakness, fatigue and sore throat.  Reportedly slid down to the floor from his couch.  Patient was admitted with concern for sepsis.  In ED, febrile to 101 F.  Other vital stable.  WBC 13.6. Troponin 24.  COVID-19, influenza and RSV PCR nonreactive.  UA with moderate Hgb.  Lactic acid 2.2.  Pro-Cal 0.6.  CT angio chest negative for PE but suggested coronary atherosclerosis and prominence of the thoracic ascending aorta at 4 cm.  Blood cultures obtained.  Foley catheter placed in ED.  Patient was started on broad-spectrum antibiotics and admitted.    Subjective: Seen and examined earlier this morning.  No major events overnight or this morning.  Patient reports pain in his throat.  Feels exhausted.  Likes to sit up in bed.  He says he is not getting the care he needs here.  He says he wants to go to another hospital like [facility] or [facility].    Assessment and plan: SIRS: Patient presents with fever, weakness, fatigue, sore throat for 3 to 4 days.  Febrile with leukocytosis and lactic acidosis in ED.  COVID-19, influenza and RSV PCR nonreactive.  UA does not suggest UTI.  He denies new UTI symptoms other than chronic frequent urination.  Blood cultures pending.  Lactic acidosis resolved.  Leukocytosis worsened this morning.  Stable vitals. - Continue broad-spectrum antibiotics pending culture - Check a 20 pathogen RVP  Chronic HFpEF: TTE in 07/2022 with LVEF of 60 to 65% and normal RVSP.  Seems to be on torsemide , Aldactone , Entresto , Coreg   and Farxiga  at home.  Difficult to assess fluid status due to body habitus. -Resume home diuretics after med rec. -Continue home Coreg  -Strict intake and output, daily weight, renal functions and electrolytes  History of CAD s/p DES stent to obtuse marginal branch in 01/2022.  No chest pain. -Continue home Coreg , aspirin , eliquis , statin and Zetia . -Continue Brilinta  after med rec -Continue other cardiac meds after med rec  Paroxysmal A-fib: Rate controlled.  Seems to be in sinus rhythm. -Continue home Eliquis  and Coreg . -Optimize electrolytes  IDDM-2 with hyperglycemia: A1c 7.5%. Recent Labs  Lab 03/12/24 0815 03/12/24 1229 03/12/24 1856 03/12/24 2118 03/13/24 0808  GLUCAP 184* 216* 246* 206* 198*  -Continue SSI-resistant scale -On Lantus  20 units daily  Essential hypertension: Slightly elevated BP this morning. -Continue home Coreg  and amlodipine . - Resume other home meds as appropriate after med rec  Fall at home/fatigue/generalized weakness - PT/OT eval - Fall precaution  History of anxiety, depression and PTSD - Continue home meds after med rec.  CKD-3A ruled out.   PAD: Stable - Continue home antiplatelets and statin after med rec  OSA -CPAP at night  BPH/possible urinary retention-seems he had Foley catheter placed in ED - Resume home BPH meds - Voiding trial prior to discharge.    Chronic pain syndrome -Continue home tramadol  and Lyrica  after med rec  Morbid obesity Body mass index is 60.49 kg/m. - Continue home Trulicity. -Can consider referral to bariatric surgery outpatient  DVT prophylaxis:   apixaban  (ELIQUIS ) tablet 5 mg  Code Status: Full code Family Communication: None at bedside Level of care: Telemetry Status is: Inpatient Remains inpatient appropriate because: SIRS with concern for sepsis   Final disposition: Likely home once medically stable   55 minutes with more than 50% spent in reviewing records, counseling  patient/family and coordinating care.  Consultants:  None  Procedures: None  Microbiology summarized: COVID-19, influenza and RSV PCR nonreactive Blood cultures pending A-20 pathogen RVP pending  Objective: Vitals:   03/13/24 0044 03/13/24 0413 03/13/24 0610 03/13/24 0856  BP:  136/86 (!) 145/67 (!) 152/66  Pulse: 85 79  89  Resp:  18  20  Temp:  99.7 F (37.6 C)  98.6 F (37 C)  TempSrc:  Oral    SpO2: 91% 92%  93%  Weight:      Height:        Examination:  GENERAL: No apparent distress.  Nontoxic. HEENT: MMM.  Vision and hearing grossly intact.  NECK: Supple.  No apparent JVD.  RESP:  No IWOB.  Fair aeration bilaterally but limited exam due to body habitus. CVS:  RRR. Heart sounds normal.  ABD/GI/GU: BS+. Abd soft, NTND.  Foley catheter in place. MSK/EXT:  Moves extremities. No apparent deformity. No edema.  SKIN: no apparent skin lesion or wound NEURO: AA.  Oriented x 4.  No apparent focal neuro deficit. PSYCH: Calm. Normal affect.   Sch Meds:  Scheduled Meds:  amLODipine   10 mg Oral q AM   apixaban   5 mg Oral BID   aspirin  EC  81 mg Oral Daily   atorvastatin   80 mg Oral Daily   carvedilol   12.5 mg Oral BID   dapagliflozin  propanediol  10 mg Oral Daily   ezetimibe   10 mg Oral Daily   insulin  aspart  0-20 Units Subcutaneous TID WC   insulin  aspart  0-5 Units Subcutaneous QHS   pantoprazole   40 mg Oral BID   tamsulosin   0.4 mg Oral Daily   Continuous Infusions:  ceFEPime  (MAXIPIME ) IV 2 g (03/13/24 0223)   vancomycin  2,000 mg (03/13/24 0426)   PRN Meds:.acetaminophen  **OR** acetaminophen , hydrALAZINE , ondansetron  **OR** ondansetron  (ZOFRAN ) IV, traMADol   Antimicrobials: Anti-infectives (From admission, onward)    Start     Dose/Rate Route Frequency Ordered Stop   03/13/24 0400  vancomycin  (VANCOREADY) IVPB 2000 mg/400 mL        2,000 mg 200 mL/hr over 120 Minutes Intravenous Every 24 hours 03/12/24 0459     03/12/24 1000  ceFEPIme  (MAXIPIME ) 2 g in  sodium chloride  0.9 % 100 mL IVPB        2 g 200 mL/hr over 30 Minutes Intravenous Every 8 hours 03/12/24 0417     03/12/24 0430  vancomycin  (VANCOREADY) IVPB 2000 mg/400 mL        2,000 mg 200 mL/hr over 120 Minutes Intravenous  Once 03/12/24 0417 03/12/24 0643   03/12/24 0115  ceFAZolin  (ANCEF ) IVPB 2g/100 mL premix        2 g 200 mL/hr over 30 Minutes Intravenous  Once 03/12/24 0112 03/12/24 0244        I have personally reviewed the following labs and images: CBC: Recent Labs  Lab 03/11/24 0011 03/11/24 2350 03/12/24 0528 03/13/24 0450  WBC 13.6*  --  12.9* 16.0*  HGB 15.7 16.0 15.0 14.1  HCT 47.9 47.0 45.9 43.1  MCV 94.3  --  95.4 96.6  PLT 200  --  191 201  BMP &GFR Recent Labs  Lab 03/11/24 0011 03/11/24 2350 03/12/24 0528 03/13/24 0450  NA 140 138 140 138  K 3.8 4.2 3.9 3.9  CL 100 104 102 103  CO2 26  --  25 23  GLUCOSE 198* 199* 202* 210*  BUN 18 23 17 17   CREATININE 1.21 1.20 1.07 1.13  CALCIUM  10.2  --  9.8 9.6   Estimated Creatinine Clearance: 87.5 mL/min (by C-G formula based on SCr of 1.13 mg/dL). Liver & Pancreas: Recent Labs  Lab 03/11/24 0011 03/13/24 0450  AST 45* 62*  ALT 22 19  ALKPHOS 65 61  BILITOT 1.4* 1.3*  PROT 8.1 7.2  ALBUMIN  4.0 3.3*   No results for input(s): LIPASE, AMYLASE in the last 168 hours. No results for input(s): AMMONIA in the last 168 hours. Diabetic: Recent Labs    03/12/24 1257  HGBA1C 7.5*   Recent Labs  Lab 03/12/24 0815 03/12/24 1229 03/12/24 1856 03/12/24 2118 03/13/24 0808  GLUCAP 184* 216* 246* 206* 198*   Cardiac Enzymes: No results for input(s): CKTOTAL, CKMB, CKMBINDEX, TROPONINI in the last 168 hours. No results for input(s): PROBNP in the last 8760 hours. Coagulation Profile: No results for input(s): INR, PROTIME in the last 168 hours. Thyroid  Function Tests: No results for input(s): TSH, T4TOTAL, FREET4, T3FREE, THYROIDAB in the last 72 hours. Lipid  Profile: No results for input(s): CHOL, HDL, LDLCALC, TRIG, CHOLHDL, LDLDIRECT in the last 72 hours. Anemia Panel: No results for input(s): VITAMINB12, FOLATE, FERRITIN, TIBC, IRON, RETICCTPCT in the last 72 hours. Urine analysis:    Component Value Date/Time   COLORURINE YELLOW 03/11/2024 0224   APPEARANCEUR CLEAR 03/11/2024 0224   LABSPEC 1.035 (H) 03/11/2024 0224   PHURINE 5.0 03/11/2024 0224   GLUCOSEU >=500 (A) 03/11/2024 0224   HGBUR MODERATE (A) 03/11/2024 0224   BILIRUBINUR NEGATIVE 03/11/2024 0224   BILIRUBINUR Large 12/30/2015 1600   KETONESUR 5 (A) 03/11/2024 0224   PROTEINUR 100 (A) 03/11/2024 0224   UROBILINOGEN 0.2 12/30/2015 1600   UROBILINOGEN 1.0 09/15/2013 1855   NITRITE NEGATIVE 03/11/2024 0224   LEUKOCYTESUR NEGATIVE 03/11/2024 0224   Sepsis Labs: Invalid input(s): PROCALCITONIN, LACTICIDVEN  Microbiology: Recent Results (from the past 240 hours)  Resp panel by RT-PCR (RSV, Flu A&B, Covid) Anterior Nasal Swab     Status: None   Collection Time: 03/11/24  3:54 AM   Specimen: Anterior Nasal Swab  Result Value Ref Range Status   SARS Coronavirus 2 by RT PCR NEGATIVE NEGATIVE Final    Comment: (NOTE) SARS-CoV-2 target nucleic acids are NOT DETECTED.  The SARS-CoV-2 RNA is generally detectable in upper respiratory specimens during the acute phase of infection. The lowest concentration of SARS-CoV-2 viral copies this assay can detect is 138 copies/mL. A negative result does not preclude SARS-Cov-2 infection and should not be used as the sole basis for treatment or other patient management decisions. A negative result may occur with  improper specimen collection/handling, submission of specimen other than nasopharyngeal swab, presence of viral mutation(s) within the areas targeted by this assay, and inadequate number of viral copies(<138 copies/mL). A negative result must be combined with clinical observations, patient history, and  epidemiological information. The expected result is Negative.  Fact Sheet for Patients:  bloggercourse.com  Fact Sheet for Healthcare Providers:  seriousbroker.it  This test is no t yet approved or cleared by the [place]  FDA and  has been authorized for detection and/or diagnosis of SARS-CoV-2 by FDA under an Emergency Use Authorization (EUA).  This EUA will remain  in effect (meaning this test can be used) for the duration of the COVID-19 declaration under Section 564(b)(1) of the Act, 21 [place].C[place]section 360bbb-3(b)(1), unless the authorization is terminated  or revoked sooner.       Influenza A by PCR NEGATIVE NEGATIVE Final   Influenza B by PCR NEGATIVE NEGATIVE Final    Comment: (NOTE) The Xpert Xpress SARS-CoV-2/FLU/RSV plus assay is intended as an aid in the diagnosis of influenza from Nasopharyngeal swab specimens and should not be used as a sole basis for treatment. Nasal washings and aspirates are unacceptable for Xpert Xpress SARS-CoV-2/FLU/RSV testing.  Fact Sheet for Patients: bloggercourse.com  Fact Sheet for Healthcare Providers: seriousbroker.it  This test is not yet approved or cleared by the [place]  FDA and has been authorized for detection and/or diagnosis of SARS-CoV-2 by FDA under an Emergency Use Authorization (EUA). This EUA will remain in effect (meaning this test can be used) for the duration of the COVID-19 declaration under Section 564(b)(1) of the Act, 21 [place]C. section 360bbb-3(b)(1), unless the authorization is terminated or revoked.     Resp Syncytial Virus by PCR NEGATIVE NEGATIVE Final    Comment: (NOTE) Fact Sheet for Patients: bloggercourse.com  Fact Sheet for Healthcare Providers: seriousbroker.it  This test is not yet approved or cleared by the [place]  FDA and has been  authorized for detection and/or diagnosis of SARS-CoV-2 by FDA under an Emergency Use Authorization (EUA). This EUA will remain in effect (meaning this test can be used) for the duration of the COVID-19 declaration under Section 564(b)(1) of the Act, 21 [place].C. section 360bbb-3(b)(1), unless the authorization is terminated or revoked.  Performed at [facility], [address], [place], [place] [address]     Radiology Studies: No results found.    Sonnie Pawloski T. Jerrik Housholder [facility]  If 7PM-7AM, please contact night-coverage [email or web address] 03/13/2024, 11:20 AM   "

## 2024-03-13 NOTE — Plan of Care (Signed)
  Problem: Coping: Goal: Ability to adjust to condition or change in health will improve Outcome: Progressing   Problem: Coping: Goal: Level of anxiety will decrease Outcome: Progressing   Problem: Elimination: Goal: Will not experience complications related to bowel motility Outcome: Progressing   Problem: Safety: Goal: Ability to remain free from injury will improve Outcome: Progressing   Problem: Skin Integrity: Goal: Risk for impaired skin integrity will decrease Outcome: Progressing

## 2024-03-14 ENCOUNTER — Other Ambulatory Visit: Payer: Self-pay | Admitting: Cardiovascular Disease

## 2024-03-14 DIAGNOSIS — A419 Sepsis, unspecified organism: Secondary | ICD-10-CM | POA: Diagnosis not present

## 2024-03-14 DIAGNOSIS — G894 Chronic pain syndrome: Secondary | ICD-10-CM | POA: Diagnosis not present

## 2024-03-14 DIAGNOSIS — I5032 Chronic diastolic (congestive) heart failure: Secondary | ICD-10-CM | POA: Diagnosis not present

## 2024-03-14 DIAGNOSIS — N1832 Chronic kidney disease, stage 3b: Secondary | ICD-10-CM | POA: Diagnosis not present

## 2024-03-14 LAB — COMPREHENSIVE METABOLIC PANEL WITH GFR
ALT: 18 U/L (ref 0–44)
AST: 51 U/L — ABNORMAL HIGH (ref 15–41)
Albumin: 3.1 g/dL — ABNORMAL LOW (ref 3.5–5.0)
Alkaline Phosphatase: 80 U/L (ref 38–126)
Anion gap: 15 (ref 5–15)
BUN: 22 mg/dL (ref 8–23)
CO2: 20 mmol/L — ABNORMAL LOW (ref 22–32)
Calcium: 9.4 mg/dL (ref 8.9–10.3)
Chloride: 103 mmol/L (ref 98–111)
Creatinine, Ser: 1.1 mg/dL (ref 0.61–1.24)
GFR, Estimated: >60 mL/min
Glucose, Bld: 154 mg/dL — ABNORMAL HIGH (ref 70–99)
Potassium: 3.6 mmol/L (ref 3.5–5.1)
Sodium: 137 mmol/L (ref 135–145)
Total Bilirubin: 0.9 mg/dL (ref 0.0–1.2)
Total Protein: 7 g/dL (ref 6.5–8.1)

## 2024-03-14 LAB — GLUCOSE, CAPILLARY
Glucose-Capillary: 161 mg/dL — ABNORMAL HIGH (ref 70–99)
Glucose-Capillary: 152 mg/dL — ABNORMAL HIGH (ref 70–99)
Glucose-Capillary: 166 mg/dL — ABNORMAL HIGH (ref 70–99)
Glucose-Capillary: 173 mg/dL — ABNORMAL HIGH (ref 70–99)

## 2024-03-14 LAB — CBC WITH DIFFERENTIAL/PLATELET
Abs Immature Granulocytes: 0.08 K/uL — ABNORMAL HIGH (ref 0.00–0.07)
Basophils Absolute: 0 K/uL (ref 0.0–0.1)
Basophils Relative: 0 %
Eosinophils Absolute: 0.2 K/uL (ref 0.0–0.5)
Eosinophils Relative: 1 %
HCT: 40.1 % (ref 39.0–52.0)
Hemoglobin: 13.3 g/dL (ref 13.0–17.0)
Immature Granulocytes: 1 %
Lymphocytes Relative: 11 %
Lymphs Abs: 1.5 K/uL (ref 0.7–4.0)
MCH: 31.7 pg (ref 26.0–34.0)
MCHC: 33.2 g/dL (ref 30.0–36.0)
MCV: 95.7 fL (ref 80.0–100.0)
Monocytes Absolute: 1.1 K/uL — ABNORMAL HIGH (ref 0.1–1.0)
Monocytes Relative: 8 %
Neutro Abs: 10.2 K/uL — ABNORMAL HIGH (ref 1.7–7.7)
Neutrophils Relative %: 79 %
Platelets: 209 K/uL (ref 150–400)
RBC: 4.19 MIL/uL — ABNORMAL LOW (ref 4.22–5.81)
RDW: 14.5 % (ref 11.5–15.5)
WBC: 13.1 K/uL — ABNORMAL HIGH (ref 4.0–10.5)
nRBC: 0 % (ref 0.0–0.2)

## 2024-03-14 LAB — CK: Total CK: 411 U/L — ABNORMAL HIGH (ref 49–397)

## 2024-03-14 LAB — PHOSPHORUS: Phosphorus: 2.7 mg/dL (ref 2.5–4.6)

## 2024-03-14 LAB — MAGNESIUM: Magnesium: 2.4 mg/dL (ref 1.7–2.4)

## 2024-03-14 LAB — PROCALCITONIN: Procalcitonin: 0.48 ng/mL

## 2024-03-14 MED ORDER — DEXAMETHASONE SOD PHOSPHATE PF 10 MG/ML IJ SOLN
10.0000 mg | Freq: Once | INTRAMUSCULAR | Status: AC
  Administered 2024-03-14: 10 mg via INTRAVENOUS

## 2024-03-14 MED ORDER — OXYCODONE HCL 5 MG PO TABS
5.0000 mg | ORAL_TABLET | Freq: Four times a day (QID) | ORAL | Status: DC | PRN
  Administered 2024-03-14 – 2024-03-15 (×3): 5 mg via ORAL
  Filled 2024-03-14 (×3): qty 1

## 2024-03-14 MED ORDER — FENTANYL CITRATE (PF) 50 MCG/ML IJ SOSY
25.0000 ug | PREFILLED_SYRINGE | Freq: Once | INTRAMUSCULAR | Status: AC
  Administered 2024-03-14: 25 ug via INTRAVENOUS
  Filled 2024-03-14: qty 1

## 2024-03-14 NOTE — Plan of Care (Signed)

## 2024-03-14 NOTE — Plan of Care (Signed)
" °  Problem: Education: Goal: Ability to describe self-care measures that may prevent or decrease complications (Diabetes Survival Skills Education) will improve Outcome: Progressing   Problem: Skin Integrity: Goal: Risk for impaired skin integrity will decrease Outcome: Progressing   Problem: Tissue Perfusion: Goal: Adequacy of tissue perfusion will improve Outcome: Progressing   Problem: Education: Goal: Knowledge of General Education information will improve Description: Including pain rating scale, medication(s)/side effects and non-pharmacologic comfort measures Outcome: Progressing   Problem: Pain Managment: Goal: General experience of comfort will improve and/or be controlled Outcome: Progressing   Problem: Safety: Goal: Ability to remain free from injury will improve Outcome: Progressing   Problem: Skin Integrity: Goal: Risk for impaired skin integrity will decrease Outcome: Progressing   "

## 2024-03-14 NOTE — Progress Notes (Signed)
 " PROGRESS NOTE  Phillip Maldonado FMW:[id number] DOB: 26-Mar-1950   PCP: Ariwodo, [name], NP  Patient is from: Home.  Lives with his wife who has dementia.  DOA: 03/11/2024 LOS: 2  Chief complaints Chief Complaint  Patient presents with   Fatigue     Brief Narrative / Interim history: 73 year old M with PMH of CAD, HFpEF, A-fib, DM-2, HTN, OSA, morbid obesity, CKD-3A, PVD, anxiety, depression, PTSD and chronic fatigue syndrome presented to ED with fever, weakness, fatigue and sore throat.  Reportedly slid down to the floor from his couch.  Patient was admitted with concern for sepsis.  In ED, febrile to 101 F.  Other vital stable.  WBC 13.6. Troponin 24.  COVID-19, influenza and RSV PCR nonreactive.  UA with moderate Hgb.  Lactic acid 2.2.  Pro-Cal 0.6.  CT angio chest negative for PE but suggested coronary atherosclerosis and prominence of the thoracic ascending aorta at 4 cm.  Blood cultures obtained.  Foley catheter placed in ED.  Patient was started on broad-spectrum antibiotics and admitted.  Blood cultures NGTD but only single set.  20 pathogen RVP negative.  Patient's symptoms improved.  Leukocytosis improved as well.    Subjective: Seen and examined earlier this morning.  No major events overnight or this morning.  Reports feeling better this morning.  No complaints other than chronic pain   Assessment and plan: SIRS: Patient presents with fever, weakness, fatigue, sore throat for 3 to 4 days.  Febrile with leukocytosis and lactic acidosis in ED.  COVID-19, influenza and RSV PCR nonreactive.  UA does not suggest UTI.  He denies new UTI symptoms other than chronic frequent urination.  Blood culture NGTD but only single cell.  Lactic acidosis resolved.  RVP negative.  Leukocytosis improved. - Discontinue vancomycin  and cefepime .  Monitor off antibiotics  Chronic HFpEF: TTE in 07/2022 with LVEF of 60 to 65% and normal RVSP.  Seems to be on torsemide , Aldactone , Entresto , Coreg  and  Farxiga  at home.  Difficult to assess fluid status due to body habitus. -Continue home torsemide , Coreg  and Farxiga . -Continue holding home Entresto  and Aldactone . -Strict intake and output, daily weight, renal functions and electrolytes  History of CAD s/p DES stent to obtuse marginal branch in 01/2022.  No chest pain. -Continue home Coreg , aspirin , eliquis , statin and Zetia . -Continue other cardiac meds after med rec  Paroxysmal A-fib: Rate controlled.  Seems to be in sinus rhythm. -Continue home Eliquis  and Coreg . -Optimize electrolytes  IDDM-2 with hyperglycemia: A1c 7.5%. Recent Labs  Lab 03/13/24 0808 03/13/24 1209 03/13/24 2126 03/14/24 0754 03/14/24 1122  GLUCAP 198* 220* 144* 161* 152*  -Continue SSI-resistant scale -Continue Lantus  20 units daily  Essential hypertension: Normotensive today -Continue home Coreg  and amlodipine .  Fall at home/fatigue/generalized weakness - PT/OT recommended SNF. - Fall precaution  History of anxiety, depression and PTSD - Continue home Cymbalta , trazodone   CKD-3A ruled out.  PAD/stasis dermatitis: Stable - Continue home antiplatelets and statin after med rec  OSA not on CPAP  BPH/possible urinary retention-seems he had Foley catheter placed in ED - Continue home Flomax  - Discontinue Foley catheter - Strict intake and output - Bladder scan every 6 hours  Chronic pain syndrome/headache -Continue Tylenol , tramadol  and Lyrica  -Give 10 mg Decadron  -P.o. oxycodone  5 mg every 6 hours as needed severe pain -IV fentanyl  25 mcg x 1   Morbid obesity Body mass index is 60.49 kg/m. - Continue home Trulicity. -Can consider referral to bariatric surgery outpatient  DVT prophylaxis:   apixaban  (ELIQUIS ) tablet 5 mg  Code Status: Full code Family Communication: None at bedside Level of care: Telemetry Status is: Inpatient Remains inpatient appropriate because: SIRS with concern for sepsis   Final disposition:  SNF   55 minutes with more than 50% spent in reviewing records, counseling patient/family and coordinating care.  Consultants:  None  Procedures: None  Microbiology summarized: COVID-19, influenza and RSV PCR nonreactive Blood culture NGTD. A-20 pathogen RVP negative  Objective: Vitals:   03/13/24 2128 03/14/24 0011 03/14/24 0443 03/14/24 1121  BP: 132/67 135/69 127/67 124/72  Pulse: 79 78 77 84  Resp: 20 (!) 24 (!) 24   Temp: 99.1 F (37.3 C) 98.8 F (37.1 C) 98.1 F (36.7 C) 98.3 F (36.8 C)  TempSrc:    Oral  SpO2: 98% 93% 91%   Weight:      Height:        Examination:  GENERAL: No apparent distress.  Nontoxic. HEENT: MMM.  Vision and hearing grossly intact.  NECK: Supple.  No apparent JVD.  RESP:  No IWOB.  Fair aeration bilaterally but limited exam due to body habitus. CVS:  RRR. Heart sounds normal.  ABD/GI/GU: BS+. Abd soft, NTND.  Foley catheter in place. MSK/EXT:  Moves extremities. No apparent deformity. No edema.  SKIN: no apparent skin lesion or wound NEURO: AA.  Oriented x 4.  No apparent focal neuro deficit. PSYCH: Calm. Normal affect.   Sch Meds:  Scheduled Meds:  amLODipine   10 mg Oral q AM   apixaban   5 mg Oral BID   aspirin  EC  81 mg Oral Daily   atorvastatin   80 mg Oral Daily   carvedilol   12.5 mg Oral BID   Chlorhexidine  Gluconate Cloth  6 each Topical Daily   dapagliflozin  propanediol  10 mg Oral Daily   DULoxetine   60 mg Oral Daily   ezetimibe   10 mg Oral Daily   insulin  aspart  0-20 Units Subcutaneous TID WC   insulin  aspart  0-5 Units Subcutaneous QHS   insulin  glargine  20 Units Subcutaneous Daily   pantoprazole   40 mg Oral BID   pregabalin   100 mg Oral TID   tamsulosin   0.4 mg Oral Daily   trazodone   150 mg Oral QHS   Continuous Infusions:   PRN Meds:.acetaminophen  **OR** acetaminophen , hydrALAZINE , ondansetron  **OR** ondansetron  (ZOFRAN ) IV, oxyCODONE , polyethylene glycol, senna-docusate,  traMADol   Antimicrobials: Anti-infectives (From admission, onward)    Start     Dose/Rate Route Frequency Ordered Stop   03/13/24 0400  vancomycin  (VANCOREADY) IVPB 2000 mg/400 mL  Status:  Discontinued        2,000 mg 200 mL/hr over 120 Minutes Intravenous Every 24 hours 03/12/24 0459 03/14/24 1213   03/12/24 1000  ceFEPIme  (MAXIPIME ) 2 g in sodium chloride  0.9 % 100 mL IVPB  Status:  Discontinued        2 g 200 mL/hr over 30 Minutes Intravenous Every 8 hours 03/12/24 0417 03/14/24 1213   03/12/24 0430  vancomycin  (VANCOREADY) IVPB 2000 mg/400 mL        2,000 mg 200 mL/hr over 120 Minutes Intravenous  Once 03/12/24 0417 03/12/24 0643   03/12/24 0115  ceFAZolin  (ANCEF ) IVPB 2g/100 mL premix        2 g 200 mL/hr over 30 Minutes Intravenous  Once 03/12/24 0112 03/12/24 0244        I have personally reviewed the following labs and images: CBC: Recent Labs  Lab 03/11/24 0011 03/11/24  2350 03/12/24 0528 03/13/24 0450 03/14/24 0559  WBC 13.6*  --  12.9* 16.0* 13.1*  NEUTROABS  --   --   --   --  10.2*  HGB 15.7 16.0 15.0 14.1 13.3  HCT 47.9 47.0 45.9 43.1 40.1  MCV 94.3  --  95.4 96.6 95.7  PLT 200  --  191 201 209   BMP &GFR Recent Labs  Lab 03/11/24 0011 03/11/24 2350 03/12/24 0528 03/13/24 0450 03/14/24 0559  NA 140 138 140 138 137  K 3.8 4.2 3.9 3.9 3.6  CL 100 104 102 103 103  CO2 26  --  25 23 20*  GLUCOSE 198* 199* 202* 210* 154*  BUN 18 23 17 17 22   CREATININE 1.21 1.20 1.07 1.13 1.10  CALCIUM  10.2  --  9.8 9.6 9.4  MG  --   --   --   --  2.4  PHOS  --   --   --   --  2.7   Estimated Creatinine Clearance: 89.9 mL/min (by C-G formula based on SCr of 1.1 mg/dL). Liver & Pancreas: Recent Labs  Lab 03/11/24 0011 03/13/24 0450 03/14/24 0559  AST 45* 62* 51*  ALT 22 19 18   ALKPHOS 65 61 80  BILITOT 1.4* 1.3* 0.9  PROT 8.1 7.2 7.0  ALBUMIN  4.0 3.3* 3.1*   No results for input(s): LIPASE, AMYLASE in the last 168 hours. No results for input(s):  AMMONIA in the last 168 hours. Diabetic: Recent Labs    03/12/24 1257  HGBA1C 7.5*   Recent Labs  Lab 03/13/24 0808 03/13/24 1209 03/13/24 2126 03/14/24 0754 03/14/24 1122  GLUCAP 198* 220* 144* 161* 152*   Cardiac Enzymes: Recent Labs  Lab 03/13/24 0450 03/14/24 0559  CKTOTAL 870* 411*   No results for input(s): PROBNP in the last 8760 hours. Coagulation Profile: No results for input(s): INR, PROTIME in the last 168 hours. Thyroid  Function Tests: No results for input(s): TSH, T4TOTAL, FREET4, T3FREE, THYROIDAB in the last 72 hours. Lipid Profile: No results for input(s): CHOL, HDL, LDLCALC, TRIG, CHOLHDL, LDLDIRECT in the last 72 hours. Anemia Panel: No results for input(s): VITAMINB12, FOLATE, FERRITIN, TIBC, IRON, RETICCTPCT in the last 72 hours. Urine analysis:    Component Value Date/Time   COLORURINE YELLOW 03/11/2024 0224   APPEARANCEUR CLEAR 03/11/2024 0224   LABSPEC 1.035 (H) 03/11/2024 0224   PHURINE 5.0 03/11/2024 0224   GLUCOSEU >=500 (A) 03/11/2024 0224   HGBUR MODERATE (A) 03/11/2024 0224   BILIRUBINUR NEGATIVE 03/11/2024 0224   BILIRUBINUR Large 12/30/2015 1600   KETONESUR 5 (A) 03/11/2024 0224   PROTEINUR 100 (A) 03/11/2024 0224   UROBILINOGEN 0.2 12/30/2015 1600   UROBILINOGEN 1.0 09/15/2013 1855   NITRITE NEGATIVE 03/11/2024 0224   LEUKOCYTESUR NEGATIVE 03/11/2024 0224   Sepsis Labs: Invalid input(s): PROCALCITONIN, LACTICIDVEN  Microbiology: Recent Results (from the past 240 hours)  Resp panel by RT-PCR (RSV, Flu A&B, Covid) Anterior Nasal Swab     Status: None   Collection Time: 03/11/24  3:54 AM   Specimen: Anterior Nasal Swab  Result Value Ref Range Status   SARS Coronavirus 2 by RT PCR NEGATIVE NEGATIVE Final    Comment: (NOTE) SARS-CoV-2 target nucleic acids are NOT DETECTED.  The SARS-CoV-2 RNA is generally detectable in upper respiratory specimens during the acute phase of  infection. The lowest concentration of SARS-CoV-2 viral copies this assay can detect is 138 copies/mL. A negative result does not preclude SARS-Cov-2 infection and should not be used  as the sole basis for treatment or other patient management decisions. A negative result may occur with  improper specimen collection/handling, submission of specimen other than nasopharyngeal swab, presence of viral mutation(s) within the areas targeted by this assay, and inadequate number of viral copies(<138 copies/mL). A negative result must be combined with clinical observations, patient history, and epidemiological information. The expected result is Negative.  Fact Sheet for Patients:  bloggercourse.com  Fact Sheet for Healthcare Providers:  seriousbroker.it  This test is no t yet approved or cleared by the [place]  FDA and  has been authorized for detection and/or diagnosis of SARS-CoV-2 by FDA under an Emergency Use Authorization (EUA). This EUA will remain  in effect (meaning this test can be used) for the duration of the COVID-19 declaration under Section 564(b)(1) of the Act, 21 U.S.C.section 360bbb-3(b)(1), unless the authorization is terminated  or revoked sooner.       Influenza A by PCR NEGATIVE NEGATIVE Final   Influenza B by PCR NEGATIVE NEGATIVE Final    Comment: (NOTE) The Xpert Xpress SARS-CoV-2/FLU/RSV plus assay is intended as an aid in the diagnosis of influenza from Nasopharyngeal swab specimens and should not be used as a sole basis for treatment. Nasal washings and aspirates are unacceptable for Xpert Xpress SARS-CoV-2/FLU/RSV testing.  Fact Sheet for Patients: bloggercourse.com  Fact Sheet for Healthcare Providers: seriousbroker.it  This test is not yet approved or cleared by the [place]  FDA and has been authorized for detection and/or diagnosis of SARS-CoV-2  by FDA under an Emergency Use Authorization (EUA). This EUA will remain in effect (meaning this test can be used) for the duration of the COVID-19 declaration under Section 564(b)(1) of the Act, 21 U.S.C. section 360bbb-3(b)(1), unless the authorization is terminated or revoked.     Resp Syncytial Virus by PCR NEGATIVE NEGATIVE Final    Comment: (NOTE) Fact Sheet for Patients: bloggercourse.com  Fact Sheet for Healthcare Providers: seriousbroker.it  This test is not yet approved or cleared by the [place]  FDA and has been authorized for detection and/or diagnosis of SARS-CoV-2 by FDA under an Emergency Use Authorization (EUA). This EUA will remain in effect (meaning this test can be used) for the duration of the COVID-19 declaration under Section 564(b)(1) of the Act, 21 U.S.C. section 360bbb-3(b)(1), unless the authorization is terminated or revoked.  Performed at [facility], [address], [place], [place] [address]   Culture, blood (routine x 2)     Status: None (Preliminary result)   Collection Time: 03/11/24 11:43 PM   Specimen: BLOOD  Result Value Ref Range Status   Specimen Description   Final    BLOOD SITE NOT SPECIFIED Performed at [facility], [address] [place], [place] [address]    Special Requests   Final    BOTTLES DRAWN AEROBIC AND ANAEROBIC Blood Culture results may not be optimal due to an inadequate volume of blood received in culture bottles Performed at [facility], [address], [place], [place] [address]    Culture   Final    NO GROWTH 2 DAYS Performed at [facility], [address], [place], [place] [address]    Report Status PENDING  Incomplete  Respiratory (~20 pathogens) panel by PCR     Status: None   Collection Time: 03/13/24  8:10 AM   Specimen: Nasopharyngeal Swab; Respiratory  Result Value Ref Range Status    Adenovirus NOT DETECTED NOT DETECTED Final   Coronavirus  229E NOT DETECTED NOT DETECTED Final    Comment: (NOTE) The Coronavirus on the Respiratory Panel, DOES NOT test for the novel  Coronavirus (2019 nCoV)    Coronavirus HKU1 NOT DETECTED NOT DETECTED Final   Coronavirus NL63 NOT DETECTED NOT DETECTED Final   Coronavirus OC43 NOT DETECTED NOT DETECTED Final   Metapneumovirus NOT DETECTED NOT DETECTED Final   Rhinovirus / Enterovirus NOT DETECTED NOT DETECTED Final   Influenza A NOT DETECTED NOT DETECTED Final   Influenza B NOT DETECTED NOT DETECTED Final   Parainfluenza Virus 1 NOT DETECTED NOT DETECTED Final   Parainfluenza Virus 2 NOT DETECTED NOT DETECTED Final   Parainfluenza Virus 3 NOT DETECTED NOT DETECTED Final   Parainfluenza Virus 4 NOT DETECTED NOT DETECTED Final   Respiratory Syncytial Virus NOT DETECTED NOT DETECTED Final   Bordetella pertussis NOT DETECTED NOT DETECTED Final   Bordetella Parapertussis NOT DETECTED NOT DETECTED Final   Chlamydophila pneumoniae NOT DETECTED NOT DETECTED Final   Mycoplasma pneumoniae NOT DETECTED NOT DETECTED Final    Comment: Performed at [facility], [address], [place], [place] [address]    Radiology Studies: No results found.    Aanika Defoor T. Korver Graybeal [facility]  If 7PM-7AM, please contact night-coverage [email or web address] 03/14/2024, 2:13 PM   "

## 2024-03-14 NOTE — Evaluation (Signed)
 Occupational Therapy Evaluation Patient Details Name: Phillip Maldonado MRN: [id number] DOB: 06/18/1950 Today's Date: 03/14/2024   History of Present Illness   73 yo male presents to therapy following hospitalization on 03/11/2024 due to fatigue, generalized weakness, fall, sore throat and fever. Pt found to have sepsis secondary to unknown etiology. Pt PMH includes but is not limited to: CAD, HFpEF, A-fib, DM II, HTN, OSA, obesity, CKD III, PVD, GAD, CHF, NSTEMI, OA. chronic pain syndrome, back and neck surgeries and PTSD.     Clinical Impressions PTA, pt lives with spouse whom has dementia and pt cares for. Pt reports ambulatory with a rollator at home and wife assists with LB ADLs. Pt presents now with deficits in strength, standing balance, endurance, pain in various areas and R shoulder ROM (reports chronic). Pt requires Mod A x 2 initially for bed mobility and initial stand from bedside but able to progress to CGA-Min A for transfers using RW. Pt requires Min A for UB ADL and up to Mod A for LB ADLs. Pt reports limited caregiver support at home for he and his wife. Feel pt would benefit from continued inpatient follow up therapy, <3 hours/day at DC. However, pt reports preference to return home with wife.     If plan is discharge home, recommend the following:   A lot of help with walking and/or transfers;A lot of help with bathing/dressing/bathroom;Assistance with cooking/housework     Functional Status Assessment   Patient has had a recent decline in their functional status and demonstrates the ability to make significant improvements in function in a reasonable and predictable amount of time.     Equipment Recommendations   None recommended by OT     Recommendations for Other Services         Precautions/Restrictions   Precautions Precautions: Fall;Other (comment) Precaution/Restrictions Comments: limited R shoulder ROM Restrictions Weight Bearing Restrictions  Per Provider Order: No     Mobility Bed Mobility Overal bed mobility: Needs Assistance Bed Mobility: Supine to Sit     Supine to sit: Mod assist, +2 for physical assistance, +2 for safety/equipment, HOB elevated     General bed mobility comments: increased time and effort for trunk and scooting forward    Transfers Overall transfer level: Needs assistance Equipment used: Rolling walker (2 wheels) Transfers: Sit to/from Stand, Bed to chair/wheelchair/BSC Sit to Stand: Mod assist, +2 physical assistance, +2 safety/equipment, From elevated surface, Contact guard assist     Step pivot transfers: Min assist, Contact guard assist     General transfer comment: Mod A x 2 to power up initially and once in standing min A and cues for RW management. Pt able to progress quickly to CGA-Min A to stand from recliner and BSC      Balance Overall balance assessment: Needs assistance, History of Falls Sitting-balance support: Feet supported Sitting balance-Leahy Scale: Good     Standing balance support: Bilateral upper extremity supported, During functional activity, Reliant on assistive device for balance Standing balance-Leahy Scale: Poor Standing balance comment: B UE at RW and CGA x 2                           ADL either performed or assessed with clinical judgement   ADL Overall ADL's : Needs assistance/impaired Eating/Feeding: Independent   Grooming: Set up;Sitting   Upper Body Bathing: Minimal assistance;Sitting   Lower Body Bathing: Moderate assistance;Sitting/lateral leans;Sit to/from stand   Upper Body Dressing :  Minimal assistance;Sitting   Lower Body Dressing: Moderate assistance;Sitting/lateral leans;Sit to/from stand   Toilet Transfer: Minimal assistance;Contact guard assist;Stand-pivot;Rolling walker (2 wheels);BSC/3in1   Toileting- Clothing Manipulation and Hygiene: Moderate assistance;Sitting/lateral lean;Sit to/from stand                Vision Baseline Vision/History: 1 Wears glasses Ability to See in Adequate Light: 0 Adequate Patient Visual Report: No change from baseline Vision Assessment?: No apparent visual deficits     Perception         Praxis         Pertinent Vitals/Pain Pain Assessment Pain Assessment: Faces Faces Pain Scale: Hurts even more Pain Location: R knee, buttocks, R HA Pain Descriptors / Indicators: Constant, Discomfort, Grimacing Pain Intervention(s): Monitored during session, Limited activity within patient's tolerance, Repositioned, Other (comment) (notified RN)     Extremity/Trunk Assessment Upper Extremity Assessment Upper Extremity Assessment: Right hand dominant;RUE deficits/detail;LUE deficits/detail RUE Deficits / Details: R UE < 10* shoulder flexion. 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   Lower Extremity Assessment Lower Extremity Assessment: Defer to PT evaluation   Cervical / Trunk Assessment Cervical / Trunk Assessment: Normal   Communication Communication Communication: No apparent difficulties   Cognition Arousal: Alert Behavior During Therapy: WFL for tasks assessed/performed Cognition: No apparent impairments                               Following commands: Intact       Cueing  General Comments   Cueing Techniques: Verbal cues  Located [facility] for pt use in room   Exercises     Shoulder Instructions      Home Living Family/patient expects to be discharged to:: Private residence Living Arrangements: Spouse/significant other Available Help at Discharge: Family;Friend(s);Neighbor Type of Home: Apartment Home Access: Level entry     Home Layout: One level     Bathroom Shower/Tub: Chief Strategy Officer: Handicapped height     Home Equipment: Rollator (4 wheels);Cane - single point;Tub bench;BSC/3in1;Electric scooter;Rolling Walker (2 wheels)   Additional Comments: pt  reports primary caregiver for spouse whom has dementia. reports a neighbor is currently checking on pt's spouse. Son lives not far but works full time      Prior Functioning/Environment Prior Level of Function : Independent/Modified Independent;Driving             Mobility Comments: mod I with use of rollator in home and use of motorized scooter in community ADLs Comments: pt reports wife assists with ADLs some though limited by her dementia and pt having to assist wife as well. pt reports unable to wipe self due to neuropathy in hands and wife assisting with this    OT Problem List: Decreased strength;Decreased activity tolerance;Impaired balance (sitting and/or standing);Obesity;Impaired UE functional use   OT Treatment/Interventions: Self-care/ADL training;Therapeutic exercise;Energy conservation;DME and/or AE instruction;Therapeutic activities;Patient/family education;Balance training      OT Goals(Current goals can be found in the care plan section)   Acute Rehab OT Goals Patient Stated Goal: go home to wife OT Goal Formulation: With patient Time For Goal Achievement: 03/28/24 Potential to Achieve Goals: Good ADL Goals Pt Will Perform Lower Body Bathing: sitting/lateral leans;sit to/from stand;with contact guard assist;with adaptive equipment Pt Will Transfer to Toilet: with supervision;ambulating Additional ADL Goal #1: Pt to complete bed mobility with Supervision in prep for EOB/OOB ADL tasks   OT Frequency:  Min  2X/week    Co-evaluation PT/OT/SLP Co-Evaluation/Treatment: Yes Reason for Co-Treatment: Complexity of the patient's impairments (multi-system involvement);Necessary to address cognition/behavior during functional activity;For patient/therapist safety;To address functional/ADL transfers PT goals addressed during session: Mobility/safety with mobility;Balance;Proper use of DME OT goals addressed during session: ADL's and self-care;Proper use of Adaptive  equipment and DME      AM-PAC OT 6 Clicks Daily Activity     Outcome Measure Help from another person eating meals?: None Help from another person taking care of personal grooming?: A Little Help from another person toileting, which includes using toliet, bedpan, or urinal?: A Lot Help from another person bathing (including washing, rinsing, drying)?: A Lot Help from another person to put on and taking off regular upper body clothing?: A Little Help from another person to put on and taking off regular lower body clothing?: A Lot 6 Click Score: 16   End of Session Equipment Utilized During Treatment: Gait belt;Rolling walker (2 wheels) Nurse Communication: Mobility status  Activity Tolerance: Patient tolerated treatment well Patient left: with call bell/phone within reach;Other (comment) (on bari [facility] - educated to press call bell when done and RN aware)  OT Visit Diagnosis: Unsteadiness on feet (R26.81);Other abnormalities of gait and mobility (R26.89)                Time: 9050-8974 OT Time Calculation (min): 36 min Charges:  OT General Charges $OT Visit: 1 Visit OT Evaluation $OT Eval Moderate Complexity: 1 Mod  Mliss NOVAK, OTR/L Acute Rehab Services Office: [phone]   Mliss Fish 03/14/2024, 11:47 AM

## 2024-03-14 NOTE — Evaluation (Signed)
 Physical Therapy Evaluation Patient Details Name: Phillip Maldonado MRN: [id number] DOB: 14-Oct-1950 Today's Date: 03/14/2024  History of Present Illness  73 yo male presents to therapy following hospitalization on 03/11/2024 due to fatigue, generalized weakness, fall, sore throat and fever. Pt found to have sepsis secondary to unknown etiology. Pt PMH includes but is not limited to: CAD, HFpEF, A-fib, DM II, HTN, OSA, obesity, CKD III, PVD, GAD, CHF, NSTEMI, OA. chronic pain syndrome, back and neck surgeries and PTSD.  Clinical Impression      Pt admitted with above diagnosis.  Pt currently with functional limitations due to the deficits listed below (see PT Problem List). Pt in bed when therapist arrived. Pt agreeable to therapy intervention. Pt required mod A x 2 for supine to sit with use of hospital bed features and increased effort, mod A x 2 for initial sit to stand  from EOB, and pt quickly progressed to min A then CGA for SPT with use of RW, pt limited with gait tolerance at time of eval with RW, CGA for 4 feet. Pt left seated on BSC and all needs in place, nurse aware of pt location, pain report and skin deterioration. Patient will benefit from continued inpatient follow up therapy, <3 hours/day. Pt will benefit from acute skilled PT to increase their independence and safety with mobility to allow discharge.       If plan is discharge home, recommend the following: A little help with walking and/or transfers;A little help with bathing/dressing/bathroom;Assistance with cooking/housework;Assist for transportation;Help with stairs or ramp for entrance   Can travel by private vehicle   Yes    Equipment Recommendations None recommended by PT  Recommendations for Other Services       Functional Status Assessment Patient has had a recent decline in their functional status and demonstrates the ability to make significant improvements in function in a reasonable and predictable amount of  time.     Precautions / Restrictions Precautions Precautions: Fall;Other (comment) Precaution/Restrictions Comments: limited R shoulder ROM Restrictions Weight Bearing Restrictions Per Provider Order: No      Mobility  Bed Mobility Overal bed mobility: Needs Assistance Bed Mobility: Supine to Sit     Supine to sit: Mod assist, +2 for physical assistance, +2 for safety/equipment, HOB elevated     General bed mobility comments: increased time and effort for trunk and scooting forward    Transfers Overall transfer level: Needs assistance Equipment used: Rolling walker (2 wheels) Transfers: Sit to/from Stand, Bed to chair/wheelchair/BSC Sit to Stand: Mod assist, +2 physical assistance, +2 safety/equipment, From elevated surface, Contact guard assist   Step pivot transfers: Min assist, Contact guard assist       General transfer comment: Mod A x 2 to power up initially and once in standing min A and cues for RW management. Pt able to progress quickly to CGA-Min A to stand from recliner and [facility]    Ambulation/Gait Ambulation/Gait assistance: Contact guard assist Gait Distance (Feet): 4 Feet Assistive device: Rolling walker (2 wheels) Gait Pattern/deviations: Step-to pattern, Trunk flexed, Wide base of support Gait velocity: decreased     General Gait Details: wide BOS with lateral sway and trunk flexion, min cues for safety and RW management. pt limited with gait tolerance and declined ambulation in hallway  Stairs            Wheelchair Mobility     Tilt Bed    Modified Rankin (Stroke Patients Only)       Balance  Overall balance assessment: Needs assistance, History of Falls Sitting-balance support: Feet supported Sitting balance-Leahy Scale: Good     Standing balance support: Bilateral upper extremity supported, During functional activity, Reliant on assistive device for balance Standing balance-Leahy Scale: Poor Standing balance comment: B UE at RW  and CGA x 2                             Pertinent Vitals/Pain Pain Assessment Pain Assessment: Faces Faces Pain Scale: Hurts even more Pain Location: R knee, buttocks, R HA Pain Descriptors / Indicators: Constant, Discomfort, Grimacing Pain Intervention(s): Limited activity within patient's tolerance, Monitored during session, Repositioned (nurse aware)    Home Living Family/patient expects to be discharged to:: Private residence Living Arrangements: Spouse/significant other Available Help at Discharge: Family;Friend(s);Neighbor Type of Home: Apartment Home Access: Level entry       Home Layout: One level Home Equipment: Rollator (4 wheels);Cane - single point;Tub bench;BSC/3in1;Electric scooter;Rolling Walker (2 wheels) Additional Comments: pt reports primary caregiver for spouse whom has dementia. reports a neighbor is currently checking on pt's spouse. Son lives not far but works full time    Prior Function Prior Level of Function : Independent/Modified Independent;Driving             Mobility Comments: mod I with use of rollator in home and use of motorized scooter in community ADLs Comments: pt reports wife assists with ADLs some though limited by her dementia and pt having to assist wife as well. pt reports unable to wipe self due to neuropathy in hands and wife assisting with this     Extremity/Trunk Assessment   Upper Extremity Assessment Upper Extremity Assessment: Defer to OT evaluation RUE Deficits / Details: R UE < 10* shoulder flexion. 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, R shoulder dysfunction    Lower Extremity Assessment Lower Extremity Assessment: Generalized weakness (B LE and UE edema noted and B LE neuropathy)    Cervical / Trunk Assessment Cervical / Trunk Assessment: Normal  Communication   Communication Communication: No apparent difficulties    Cognition Arousal: Alert Behavior  During Therapy: WFL for tasks assessed/performed   PT - Cognitive impairments: No apparent impairments                         Following commands: Intact       Cueing Cueing Techniques: Verbal cues     General Comments General comments (skin integrity, edema, etc.): Located [facility] for pt use in room    Exercises     Assessment/Plan    PT Assessment Patient needs continued PT services  PT Problem List Decreased strength;Decreased activity tolerance;Decreased balance;Decreased mobility;Decreased coordination;Decreased cognition;Pain       PT Treatment Interventions DME instruction;Gait training;Stair training;Functional mobility training;Therapeutic activities;Therapeutic exercise;Balance training;Neuromuscular re-education;Patient/family education    PT Goals (Current goals can be found in the Care Plan section)  Acute Rehab PT Goals Patient Stated Goal: to get well and go home PT Goal Formulation: With patient Time For Goal Achievement: 03/28/24 Potential to Achieve Goals: Good    Frequency Min 3X/week     Co-evaluation PT/OT/SLP Co-Evaluation/Treatment: Yes Reason for Co-Treatment: Complexity of the patient's impairments (multi-system involvement);Necessary to address cognition/behavior during functional activity;For patient/therapist safety;To address functional/ADL transfers PT goals addressed during session: Mobility/safety with mobility;Balance;Proper use of DME OT goals addressed during session: ADL's and self-care;Proper use of Adaptive equipment  and DME       AM-PAC PT 6 Clicks Mobility  Outcome Measure Help needed turning from your back to your side while in a flat bed without using bedrails?: A Little Help needed moving from lying on your back to sitting on the side of a flat bed without using bedrails?: A Lot Help needed moving to and from a bed to a chair (including a wheelchair)?: A Little Help needed standing up from a chair using your arms  (e.g., wheelchair or bedside chair)?: A Little Help needed to walk in hospital room?: A Little Help needed climbing 3-5 steps with a railing? : Total 6 Click Score: 15    End of Session Equipment Utilized During Treatment: Gait belt Activity Tolerance: Patient tolerated treatment well Patient left: Other (comment) (pt left seated on BSC and nurse aware with call bell in reach and pt instructed to use call bell when finished and await staff assist) Nurse Communication: Mobility status;Patient requests pain meds;Other (comment) (skin integrity L buttock) PT Visit Diagnosis: Other abnormalities of gait and mobility (R26.89);Unsteadiness on feet (R26.81);Muscle weakness (generalized) (M62.81);Pain;Difficulty in walking, not elsewhere classified (R26.2)    Time: 9050-8972 PT Time Calculation (min) (ACUTE ONLY): 38 min   Charges:   PT Evaluation $PT Eval Low Complexity: 1 Low PT Treatments $Therapeutic Activity: 8-22 mins PT General Charges $$ ACUTE PT VISIT: 1 Visit         [place], PT Acute Rehab   [place] VEAR Drone 03/14/2024, 1:47 PM

## 2024-03-14 NOTE — Progress Notes (Signed)
" °   03/14/24 2249  BiPAP/CPAP/SIPAP  BiPAP/CPAP/SIPAP Pt Type Adult  Reason BIPAP/CPAP not in use Non-compliant (Patient refused CPAP qhs.  Patient encouraged to contact RT should he change his mind.)    "

## 2024-03-14 NOTE — Progress Notes (Signed)
" °   03/13/24 2334  BiPAP/CPAP/SIPAP  BiPAP/CPAP/SIPAP Pt Type Adult  Reason BIPAP/CPAP not in use Non-compliant (Patient states he would rather not wear a CPAP tonight and sleeps fine without. Advised to call RT if he changes his mind.)    "

## 2024-03-15 ENCOUNTER — Other Ambulatory Visit (HOSPITAL_COMMUNITY): Payer: Self-pay

## 2024-03-15 LAB — COMPREHENSIVE METABOLIC PANEL WITH GFR
ALT: 35 U/L (ref 0–44)
AST: 75 U/L — ABNORMAL HIGH (ref 15–41)
Albumin: 3.3 g/dL — ABNORMAL LOW (ref 3.5–5.0)
Alkaline Phosphatase: 101 U/L (ref 38–126)
Anion gap: 15 (ref 5–15)
BUN: 28 mg/dL — ABNORMAL HIGH (ref 8–23)
CO2: 19 mmol/L — ABNORMAL LOW (ref 22–32)
Calcium: 9.6 mg/dL (ref 8.9–10.3)
Chloride: 102 mmol/L (ref 98–111)
Creatinine, Ser: 1.05 mg/dL (ref 0.61–1.24)
GFR, Estimated: >60 mL/min
Glucose, Bld: 183 mg/dL — ABNORMAL HIGH (ref 70–99)
Potassium: 4.1 mmol/L (ref 3.5–5.1)
Sodium: 136 mmol/L (ref 135–145)
Total Bilirubin: 0.9 mg/dL (ref 0.0–1.2)
Total Protein: 7.8 g/dL (ref 6.5–8.1)

## 2024-03-15 LAB — CBC
HCT: 42.9 % (ref 39.0–52.0)
Hemoglobin: 14.3 g/dL (ref 13.0–17.0)
MCH: 31.3 pg (ref 26.0–34.0)
MCHC: 33.3 g/dL (ref 30.0–36.0)
MCV: 93.9 fL (ref 80.0–100.0)
Platelets: 255 K/uL (ref 150–400)
RBC: 4.57 MIL/uL (ref 4.22–5.81)
RDW: 14.1 % (ref 11.5–15.5)
WBC: 11.4 K/uL — ABNORMAL HIGH (ref 4.0–10.5)
nRBC: 0 % (ref 0.0–0.2)

## 2024-03-15 LAB — CK: Total CK: 265 U/L (ref 49–397)

## 2024-03-15 LAB — MAGNESIUM: Magnesium: 2.7 mg/dL — ABNORMAL HIGH (ref 1.7–2.4)

## 2024-03-15 LAB — GLUCOSE, CAPILLARY: Glucose-Capillary: 186 mg/dL — ABNORMAL HIGH (ref 70–99)

## 2024-03-15 MED ORDER — TRAMADOL HCL 50 MG PO TABS
50.0000 mg | ORAL_TABLET | Freq: Two times a day (BID) | ORAL | 0 refills | Status: AC | PRN
  Filled 2024-03-15: qty 14, 7d supply, fill #0

## 2024-03-15 NOTE — Discharge Summary (Signed)
 Physician Discharge Summary  Phillip Maldonado FMW:[id number] DOB: Jan 11, 1951 DOA: 03/11/2024  PCP: [name], NP  Admit date: 03/11/2024 Discharge date: 03/15/2024  Admitted From: Home Disposition: Home with home health  Recommendations for Outpatient Follow-up:  Follow up with PCP in 1-2 weeks Please obtain BMP/CBC in one week   Home Health: PT/OT Equipment/Devices: Available at home  Discharge Condition: Fair CODE STATUS: Full code Diet recommendation: Low-salt diet  Discharge summary: 73 year old with history of coronary artery disease, heart failure with preserved ejection fraction, A-fib, type 2 diabetes, sleep apnea, morbid obesity, CKD stage IIIa, anxiety depression, PTSD and chronic fatigue syndrome, chronic cervical pain present to the ER with fever, weakness, fatigue and sore throat, slid down to the floor from his couch.  On initial evaluation in the ER, temperature 101, WBC count 13.6, COVID, flu and RSV negative.  UA was normal.  Lactic acid was 2.2.  Procalcitonin was 0.6.  CT angiogram of the chest negative for PE, however suggested coronary atherosclerosis and prominence of thoracic ascending aorta at 4 cm.  Blood cultures were drawn, initially Foley catheter was placed and patient was started on broad-spectrum antibiotics and admitted to the hospital.  Treated symptomatically, IV fluids.  Improved.  Did not grow any evidence of bacterial infection.  Monitor off antibiotics.  Currently clinically improved and able to go home with close outpatient follow-up.  SIRS: Presented with fever, weakness fatigue and sore throat.  Flu, RSV, COVID, respiratory virus panel were negative.  Blood cultures negative.  Leukocytosis improved.  Off antibiotics.  Acute infection ruled out.  Chronic medical issues including Chronic HFpEF,  Paroxysmal A-fib, currently sinus rhythm  Essential hypertension, blood pressure stable today.   Euvolemic.  Resume Farxiga , Coreg , torsemide ,  Entresto  and Aldactone  today.  Patient also on aspirin  and Eliquis  that he will continue.  He is tolerating statin and Zetia .  Therapeutic on Eliquis .  Chronic pain syndrome: Patient is on Lyrica  that is continued.  Patient also on intermittent prescription of tramadol  that helps with his neck pain.  Patient does have pain management clinic follow-up.  He is prescribed a short course of tramadol  for 1 week until he can follow-up with the pain management clinic.  He may benefit with long-term pain management.  BPH with urinary retention: Foley catheter was discontinued.  He was successfully voiding.  On Flomax .  Patient with extensive medical issues but currently stable.  Able to go home with outpatient follow-up.   Discharge Diagnoses:  Principal Problem:   Sepsis due to undetermined organism [facility]) Active Problems:   HTN (hypertension)   Hypercholesterolemia   OSA (obstructive sleep apnea)   Chronic pain syndrome   Diabetes mellitus (HCC)   PVD (peripheral vascular disease)   Class 3 severe obesity with serious comorbidity and body mass index (BMI) of 60.0 to 69.9 in adult [facility])   CKD (chronic kidney disease) stage 3, GFR 30-59 ml/min (HCC)   Chronic diastolic (congestive) heart failure (HCC)   PAF (paroxysmal atrial fibrillation) (HCC)    Discharge Instructions  Discharge Instructions     Diet Carb Modified   Complete by: As directed    Increase activity slowly   Complete by: As directed       Allergies as of 03/15/2024       Reactions   Chlorzoxazone Other (See Comments)   Other reaction(s): Angioedema (ALLERGY/intolerance)   Piroxicam Shortness Of Breath, Swelling   Tongue   Latex Other (See Comments), Rash   Rash and itching  Other reaction(s): Other (See Comments) Burning Feeling   Adhesive [tape] Other (See Comments)   welts Unknown paper tape ok to use         Medication List     STOP taking these medications    HumaLOG KwikPen 100 UNIT/ML  KwikPen Generic drug: insulin  lispro   silver  sulfADIAZINE  1 % cream Commonly known as: SILVADENE    ticagrelor  90 MG Tabs tablet Commonly known as: Brilinta    torsemide  10 MG tablet Commonly known as: DEMADEX    triamcinolone  cream 0.1 % Commonly known as: KENALOG    Trulicity 0.75 MG/0.5ML Soaj Generic drug: Dulaglutide       TAKE these medications    Accu-Chek Aviva Plus test strip Generic drug: glucose blood 1 each by Other route See admin instructions. Check blood sugar twice daily   acetaminophen  500 MG tablet Commonly known as: TYLENOL  Take 1,000 mg by mouth in the morning and at bedtime.   amLODipine  10 MG tablet Commonly known as: NORVASC  Take 1 tablet (10 mg total) by mouth in the morning.   apixaban  5 MG Tabs tablet Commonly known as: ELIQUIS  Take 5 mg by mouth 2 (two) times daily.   aspirin  EC 81 MG tablet Take 1 tablet (81 mg total) by mouth daily. Swallow whole. What changed: when to take this   atorvastatin  80 MG tablet Commonly known as: LIPITOR  Take 1 tablet (80 mg total) by mouth daily.   carvedilol  12.5 MG tablet Commonly known as: COREG  TAKE 1 TABLET BY MOUTH TWICE A DAY   Centrum Silver  50+Men Tabs Take 1 tablet by mouth daily.   cholecalciferol 25 MCG (1000 UNIT) tablet Commonly known as: VITAMIN D3 Take 1,000 Units by mouth daily.   docusate sodium  100 MG capsule Commonly known as: COLACE Take 100 mg by mouth 2 (two) times daily as needed for mild constipation.   DULoxetine  60 MG capsule Commonly known as: CYMBALTA  Take 1 capsule (60 mg total) by mouth daily.   Entresto  24-26 MG Generic drug: sacubitril -valsartan  TAKE 1 TABLET BY MOUTH TWICE DAILY   ezetimibe  10 MG tablet Commonly known as: ZETIA  Take 10 mg by mouth daily.   Farxiga  10 MG Tabs tablet Generic drug: dapagliflozin  propanediol TAKE 1 TABLET BY MOUTH EVERY DAY   icosapent  Ethyl 1 g capsule Commonly known as: VASCEPA  TAKE 2 CAPSULES TWICE A DAY    Lubricant Eye Drops 0.4-0.3 % Soln Generic drug: Polyethyl Glycol-Propyl Glycol Place 1 drop into both eyes 3 (three) times daily as needed (dry/irritated eyes.).   metFORMIN  1000 MG tablet Commonly known as: GLUCOPHAGE  Take 1,000 mg by mouth 2 (two) times daily.   Mounjaro 5 MG/0.5ML Pen Generic drug: tirzepatide Inject 5 mg into the skin once a week.   pantoprazole  40 MG tablet Commonly known as: PROTONIX  Take 40 mg by mouth 2 (two) times daily.   potassium chloride  10 MEQ tablet Commonly known as: KLOR-CON  M TAKE 1 TABLET(10 MEQ) BY MOUTH TWICE DAILY What changed: See the new instructions.   pregabalin  200 MG capsule Commonly known as: LYRICA  Take 200 mg by mouth 3 (three) times daily.   sildenafil  50 MG tablet Commonly known as: VIAGRA  TAKE 1 TABLET BY MOUTH EVERY DAY AS NEEDED ERECTILE DYSFUNCTION   silodosin 8 MG Caps capsule Commonly known as: RAPAFLO Take 8 mg by mouth daily.   spironolactone  25 MG tablet Commonly known as: ALDACTONE  Take 1 tablet (25 mg total) by mouth daily. NEED OV LAST ATTEMPT.   tamsulosin  0.4 MG Caps capsule  Commonly known as: FLOMAX  Take 1 capsule (0.4 mg total) by mouth daily.   traMADol  50 MG tablet Commonly known as: ULTRAM  Take 1 tablet (50 mg total) by mouth 2 (two) times daily as needed for up to 7 days.   trazodone  300 MG tablet Commonly known as: DESYREL  Take 1 tablet (300 mg total) by mouth at bedtime.   vitamin B-12 500 MCG tablet Commonly known as: CYANOCOBALAMIN Take 500 mcg by mouth daily. Unknown dosage        Allergies[1]  Consultations: None   Procedures/Studies: CT Angio Chest PE W/Cm &/Or Wo Cm Result Date: 03/12/2024 EXAM: CTA CHEST 03/12/2024 02:10:42 AM TECHNIQUE: CTA of the chest was performed without and with the administration of 75 mL of iohexol  (OMNIPAQUE ) 350 MG/ML injection. Multiplanar reformatted images are provided for review. MIP images are provided for review. Automated exposure  control, iterative reconstruction, and/or weight based adjustment of the mA/kV was utilized to reduce the radiation dose to as low as reasonably achievable. COMPARISON: Portable chest yesterday at 11:36 pm, CTA chest 07/15/2022. CLINICAL HISTORY: Pulmonary embolism (PE) suspected, high prob. FINDINGS: PULMONARY ARTERIES: The pulmonary arteries are normal caliber. There is suboptimal arterial opacification. No embolus is seen out through lobar branches. Segmental and subsegmental arteries are either unopacified or obscured due to breathing motion. MEDIASTINUM: There is mild cardiomegaly. The coronary arteries are heavily calcified. There is atherosclerosis and tortuosity of the aorta and great vessels. The ascending aorta is slightly prominent, measuring 4 cm. There is no dissection. LYMPH NODES: No mediastinal, hilar or axillary lymphadenopathy. LUNGS AND PLEURA: There is diffuse bronchial thickening. There is mild posterior atelectasis, and coarse linear atelectatic markings in both lower lobes compatible with additional atelectasis. Chronic mild elevation of the right hemidiaphragm. There is no consolidation, effusion, pneumothorax, or visible nodules. UPPER ABDOMEN: There are numerous surgical clips in between the stomach and spleen, causing artifact in addition to beam hardening related to body habitus. Upper abdominal structures are not well seen. The liver does appear to be at least mildly cirrhotic. The spleen is mildly prominent as before. SOFT TISSUES AND BONES: There is extensive bridging enthesopathy of the thoracic spine. Multilevel ventral and dorsal cervical spine fusion hardware partially visible. No acute osseous findings. IMPRESSION: 1. No evidence of pulmonary embolism to the lobar level . Segmental and subsegmental arteries not evaluated due to suboptimal arterial opacification and motion artifact. 2. Diffuse bronchial thickening, and atelectasis in both lower lobes. 3. Mild cardiomegaly with  heavily calcified coronary arteries and atherosclerosis of the aorta and great vessels, with slight prominence of the ascending aorta at 4 cm and no dissection. Annual imaging follow-up by CTA or MRA. recommended. 4. Mildly cirrhotic liver and mildly prominent spleen, with upper abdominal structures not well seen due to metallic and beam hardening artifact. Electronically signed by: Francis Quam MD 03/12/2024 03:05 AM EST RP Workstation: [id number]   DG Chest Port 1 View Result Date: 03/12/2024 EXAM: 1 VIEW(S) XRAY OF THE CHEST 03/11/2024 11:39:00 PM COMPARISON: Portable chest 07/14/2022. CLINICAL HISTORY: chest pain, fall FINDINGS: LUNGS AND PLEURA: Linear atelectasis increased in the left lung base. No focal pneumonia is evident. No pleural effusion. No pneumothorax. HEART AND MEDIASTINUM: Mild cardiomegaly without evidence of CHF. The mediastinum is stable with aortic atherosclerosis. BONES AND SOFT TISSUES: Anterior and posterior fusion hardware in the cervical spine partially visible over multiple levels. Numerous surgical clips in the left upper abdomen. Thoracic spondylosis and bridging enthesopathy. IMPRESSION: 1. No acute cardiopulmonary findings. 2. Mild  left basilar atelectasis. 3. Mild cardiomegaly without evidence of CHF. Electronically signed by: Francis Quam MD 03/12/2024 12:30 AM EST RP Workstation: [id number]   (Echo, Carotid, EGD, Colonoscopy, ERCP)    Subjective: Patient seen and examined.  No overnight events.  Patient tells me that he is back to himself.  His wife has dementia and he needs to go home.  Patient mobilized with physical therapist in front of this provider, he walked around in the hallway with a walker.  He just needs to take extra precautions however patient stated that this is his baseline and he would like to go home and take care of his wife.  Urinated successfully after Foley catheter was removed.   Discharge Exam: Vitals:   03/14/24 2231 03/15/24 0506  BP:  131/72 131/87  Pulse: 69 70  Resp:  20  Temp:  97.6 F (36.4 C)  SpO2:  92%   Vitals:   03/14/24 1445 03/14/24 2035 03/14/24 2231 03/15/24 0506  BP: 132/68 128/78 131/72 131/87  Pulse: 82 69 69 70  Resp: 15 20  20   Temp: 98.1 F (36.7 C) 97.8 F (36.6 C)  97.6 F (36.4 C)  TempSrc:  Oral  Oral  SpO2: 95% 93%  92%  Weight:      Height:        General: Pt is alert, awake, not in acute distress Not in any distress.  Frail. Cardiovascular: RRR, S1/S2 +, no rubs, no gallops Respiratory: CTA bilaterally, no wheezing, no rhonchi Abdominal: Soft, NT, ND, bowel sounds +, obese and pendulous. Extremities: no edema, no cyanosis    The results of significant diagnostics from this hospitalization (including imaging, microbiology, ancillary and laboratory) are listed below for reference.     Microbiology: Recent Results (from the past 240 hours)  Resp panel by RT-PCR (RSV, Flu A&B, Covid) Anterior Nasal Swab     Status: None   Collection Time: 03/11/24  3:54 AM   Specimen: Anterior Nasal Swab  Result Value Ref Range Status   SARS Coronavirus 2 by RT PCR NEGATIVE NEGATIVE Final    Comment: (NOTE) SARS-CoV-2 target nucleic acids are NOT DETECTED.  The SARS-CoV-2 RNA is generally detectable in upper respiratory specimens during the acute phase of infection. The lowest concentration of SARS-CoV-2 viral copies this assay can detect is 138 copies/mL. A negative result does not preclude SARS-Cov-2 infection and should not be used as the sole basis for treatment or other patient management decisions. A negative result may occur with  improper specimen collection/handling, submission of specimen other than nasopharyngeal swab, presence of viral mutation(s) within the areas targeted by this assay, and inadequate number of viral copies(<138 copies/mL). A negative result must be combined with clinical observations, patient history, and epidemiological information. The expected result is  Negative.  Fact Sheet for Patients:  bloggercourse.com  Fact Sheet for Healthcare Providers:  seriousbroker.it  This test is no t yet approved or cleared by the [place]  FDA and  has been authorized for detection and/or diagnosis of SARS-CoV-2 by FDA under an Emergency Use Authorization (EUA). This EUA will remain  in effect (meaning this test can be used) for the duration of the COVID-19 declaration under Section 564(b)(1) of the Act, 21 U.S.C.section 360bbb-3(b)(1), unless the authorization is terminated  or revoked sooner.       Influenza A by PCR NEGATIVE NEGATIVE Final   Influenza B by PCR NEGATIVE NEGATIVE Final    Comment: (NOTE) The Xpert Xpress SARS-CoV-2/FLU/RSV plus assay is intended  as an aid in the diagnosis of influenza from Nasopharyngeal swab specimens and should not be used as a sole basis for treatment. Nasal washings and aspirates are unacceptable for Xpert Xpress SARS-CoV-2/FLU/RSV testing.  Fact Sheet for Patients: bloggercourse.com  Fact Sheet for Healthcare Providers: seriousbroker.it  This test is not yet approved or cleared by the [place]  FDA and has been authorized for detection and/or diagnosis of SARS-CoV-2 by FDA under an Emergency Use Authorization (EUA). This EUA will remain in effect (meaning this test can be used) for the duration of the COVID-19 declaration under Section 564(b)(1) of the Act, 21 U.S.C. section 360bbb-3(b)(1), unless the authorization is terminated or revoked.     Resp Syncytial Virus by PCR NEGATIVE NEGATIVE Final    Comment: (NOTE) Fact Sheet for Patients: bloggercourse.com  Fact Sheet for Healthcare Providers: seriousbroker.it  This test is not yet approved or cleared by the [place]  FDA and has been authorized for detection and/or diagnosis of  SARS-CoV-2 by FDA under an Emergency Use Authorization (EUA). This EUA will remain in effect (meaning this test can be used) for the duration of the COVID-19 declaration under Section 564(b)(1) of the Act, 21 U.S.C. section 360bbb-3(b)(1), unless the authorization is terminated or revoked.  Performed at [facility], [address] [place], [place] [address]   Culture, blood (routine x 2)     Status: None (Preliminary result)   Collection Time: 03/11/24 11:43 PM   Specimen: BLOOD  Result Value Ref Range Status   Specimen Description   Final    BLOOD SITE NOT SPECIFIED Performed at [facility], [address] [place], [place] [address]    Special Requests   Final    BOTTLES DRAWN AEROBIC AND ANAEROBIC Blood Culture results may not be optimal due to an inadequate volume of blood received in culture bottles Performed at [facility], [address] [place], [place] [address]    Culture   Final    NO GROWTH 3 DAYS Performed at [facility], [address], [place], [place] [address]    Report Status PENDING  Incomplete  Respiratory (~20 pathogens) panel by PCR     Status: None   Collection Time: 03/13/24  8:10 AM   Specimen: Nasopharyngeal Swab; Respiratory  Result Value Ref Range Status   Adenovirus NOT DETECTED NOT DETECTED Final   Coronavirus 229E NOT DETECTED NOT DETECTED Final    Comment: (NOTE) The Coronavirus on the Respiratory Panel, DOES NOT test for the novel  Coronavirus (2019 nCoV)    Coronavirus HKU1 NOT DETECTED NOT DETECTED Final   Coronavirus NL63 NOT DETECTED NOT DETECTED Final   Coronavirus OC43 NOT DETECTED NOT DETECTED Final   Metapneumovirus NOT DETECTED NOT DETECTED Final   Rhinovirus / Enterovirus NOT DETECTED NOT DETECTED Final   Influenza A NOT DETECTED NOT DETECTED Final   Influenza B NOT DETECTED NOT DETECTED Final   Parainfluenza Virus 1 NOT DETECTED NOT DETECTED Final    Parainfluenza Virus 2 NOT DETECTED NOT DETECTED Final   Parainfluenza Virus 3 NOT DETECTED NOT DETECTED Final   Parainfluenza Virus 4 NOT DETECTED NOT DETECTED Final   Respiratory Syncytial Virus NOT DETECTED NOT DETECTED Final   Bordetella pertussis NOT DETECTED NOT DETECTED Final   Bordetella Parapertussis NOT DETECTED NOT DETECTED Final   Chlamydophila pneumoniae NOT DETECTED NOT DETECTED Final   Mycoplasma pneumoniae NOT DETECTED NOT DETECTED Final    Comment: Performed at [facility],  [address] [place], [place] [address]     Labs: BNP (last 3 results) No results for input(s): BNP in the last 8760 hours. Basic Metabolic Panel: Recent Labs  Lab 03/11/24 0011 03/11/24 2350 03/12/24 0528 03/13/24 0450 03/14/24 0559 03/15/24 0715  NA 140 138 140 138 137 136  K 3.8 4.2 3.9 3.9 3.6 4.1  CL 100 104 102 103 103 102  CO2 26  --  25 23 20* 19*  GLUCOSE 198* 199* 202* 210* 154* 183*  BUN 18 23 17 17 22  28*  CREATININE 1.21 1.20 1.07 1.13 1.10 1.05  CALCIUM  10.2  --  9.8 9.6 9.4 9.6  MG  --   --   --   --  2.4 2.7*  PHOS  --   --   --   --  2.7  --    Liver Function Tests: Recent Labs  Lab 03/11/24 0011 03/13/24 0450 03/14/24 0559 03/15/24 0715  AST 45* 62* 51* 75*  ALT 22 19 18  35  ALKPHOS 65 61 80 101  BILITOT 1.4* 1.3* 0.9 0.9  PROT 8.1 7.2 7.0 7.8  ALBUMIN  4.0 3.3* 3.1* 3.3*   No results for input(s): LIPASE, AMYLASE in the last 168 hours. No results for input(s): AMMONIA in the last 168 hours. CBC: Recent Labs  Lab 03/11/24 0011 03/11/24 2350 03/12/24 0528 03/13/24 0450 03/14/24 0559 03/15/24 0715  WBC 13.6*  --  12.9* 16.0* 13.1* 11.4*  NEUTROABS  --   --   --   --  10.2*  --   HGB 15.7 16.0 15.0 14.1 13.3 14.3  HCT 47.9 47.0 45.9 43.1 40.1 42.9  MCV 94.3  --  95.4 96.6 95.7 93.9  PLT 200  --  191 201 209 255   Cardiac Enzymes: Recent Labs  Lab 03/13/24 0450 03/14/24 0559 03/15/24 0715  CKTOTAL 870* 411* 265   BNP: Invalid  input(s): POCBNP CBG: Recent Labs  Lab 03/14/24 0754 03/14/24 1122 03/14/24 1651 03/14/24 2134 03/15/24 0801  GLUCAP 161* 152* 166* 173* 186*   D-Dimer No results for input(s): DDIMER in the last 72 hours. Hgb A1c Recent Labs    03/12/24 1257  HGBA1C 7.5*   Lipid Profile No results for input(s): CHOL, HDL, LDLCALC, TRIG, CHOLHDL, LDLDIRECT in the last 72 hours. Thyroid  function studies No results for input(s): TSH, T4TOTAL, T3FREE, THYROIDAB in the last 72 hours.  Invalid input(s): FREET3 Anemia work up No results for input(s): VITAMINB12, FOLATE, FERRITIN, TIBC, IRON, RETICCTPCT in the last 72 hours. Urinalysis    Component Value Date/Time   COLORURINE YELLOW 03/11/2024 0224   APPEARANCEUR CLEAR 03/11/2024 0224   LABSPEC 1.035 (H) 03/11/2024 0224   PHURINE 5.0 03/11/2024 0224   GLUCOSEU >=500 (A) 03/11/2024 0224   HGBUR MODERATE (A) 03/11/2024 0224   BILIRUBINUR NEGATIVE 03/11/2024 0224   BILIRUBINUR Large 12/30/2015 1600   KETONESUR 5 (A) 03/11/2024 0224   PROTEINUR 100 (A) 03/11/2024 0224   UROBILINOGEN 0.2 12/30/2015 1600   UROBILINOGEN 1.0 09/15/2013 1855   NITRITE NEGATIVE 03/11/2024 0224   LEUKOCYTESUR NEGATIVE 03/11/2024 0224   Sepsis Labs Recent Labs  Lab 03/12/24 0528 03/13/24 0450 03/14/24 0559 03/15/24 0715  WBC 12.9* 16.0* 13.1* 11.4*   Microbiology Recent Results (from the past 240 hours)  Resp panel by RT-PCR (RSV, Flu A&B, Covid) Anterior Nasal Swab     Status: None   Collection Time: 03/11/24  3:54 AM   Specimen: Anterior Nasal Swab  Result Value Ref Range Status  SARS Coronavirus 2 by RT PCR NEGATIVE NEGATIVE Final    Comment: (NOTE) SARS-CoV-2 target nucleic acids are NOT DETECTED.  The SARS-CoV-2 RNA is generally detectable in upper respiratory specimens during the acute phase of infection. The lowest concentration of SARS-CoV-2 viral copies this assay can detect is 138 copies/mL. A negative  result does not preclude SARS-Cov-2 infection and should not be used as the sole basis for treatment or other patient management decisions. A negative result may occur with  improper specimen collection/handling, submission of specimen other than nasopharyngeal swab, presence of viral mutation(s) within the areas targeted by this assay, and inadequate number of viral copies(<138 copies/mL). A negative result must be combined with clinical observations, patient history, and epidemiological information. The expected result is Negative.  Fact Sheet for Patients:  bloggercourse.com  Fact Sheet for Healthcare Providers:  seriousbroker.it  This test is no t yet approved or cleared by the [place]  FDA and  has been authorized for detection and/or diagnosis of SARS-CoV-2 by FDA under an Emergency Use Authorization (EUA). This EUA will remain  in effect (meaning this test can be used) for the duration of the COVID-19 declaration under Section 564(b)(1) of the Act, 21 U.S.C.section 360bbb-3(b)(1), unless the authorization is terminated  or revoked sooner.       Influenza A by PCR NEGATIVE NEGATIVE Final   Influenza B by PCR NEGATIVE NEGATIVE Final    Comment: (NOTE) The Xpert Xpress SARS-CoV-2/FLU/RSV plus assay is intended as an aid in the diagnosis of influenza from Nasopharyngeal swab specimens and should not be used as a sole basis for treatment. Nasal washings and aspirates are unacceptable for Xpert Xpress SARS-CoV-2/FLU/RSV testing.  Fact Sheet for Patients: bloggercourse.com  Fact Sheet for Healthcare Providers: seriousbroker.it  This test is not yet approved or cleared by the [place]  FDA and has been authorized for detection and/or diagnosis of SARS-CoV-2 by FDA under an Emergency Use Authorization (EUA). This EUA will remain in effect (meaning this test can be used)  for the duration of the COVID-19 declaration under Section 564(b)(1) of the Act, 21 U.S.C. section 360bbb-3(b)(1), unless the authorization is terminated or revoked.     Resp Syncytial Virus by PCR NEGATIVE NEGATIVE Final    Comment: (NOTE) Fact Sheet for Patients: bloggercourse.com  Fact Sheet for Healthcare Providers: seriousbroker.it  This test is not yet approved or cleared by the [place]  FDA and has been authorized for detection and/or diagnosis of SARS-CoV-2 by FDA under an Emergency Use Authorization (EUA). This EUA will remain in effect (meaning this test can be used) for the duration of the COVID-19 declaration under Section 564(b)(1) of the Act, 21 U.S.C. section 360bbb-3(b)(1), unless the authorization is terminated or revoked.  Performed at [facility], [address] [place], [place] [address]   Culture, blood (routine x 2)     Status: None (Preliminary result)   Collection Time: 03/11/24 11:43 PM   Specimen: BLOOD  Result Value Ref Range Status   Specimen Description   Final    BLOOD SITE NOT SPECIFIED Performed at [facility], [address], [place], [place] [address]    Special Requests   Final    BOTTLES DRAWN AEROBIC AND ANAEROBIC Blood Culture results may not be optimal due to an inadequate volume of blood received in culture bottles Performed at [facility], [address] [place], [place] [address]    Culture   Final    NO GROWTH 3 DAYS  Performed at [facility], [address], [place], [place] [address]    Report Status PENDING  Incomplete  Respiratory (~20 pathogens) panel by PCR     Status: None   Collection Time: 03/13/24  8:10 AM   Specimen: Nasopharyngeal Swab; Respiratory  Result Value Ref Range Status   Adenovirus NOT DETECTED NOT DETECTED Final   Coronavirus 229E NOT DETECTED NOT DETECTED Final    Comment:  (NOTE) The Coronavirus on the Respiratory Panel, DOES NOT test for the novel  Coronavirus (2019 nCoV)    Coronavirus HKU1 NOT DETECTED NOT DETECTED Final   Coronavirus NL63 NOT DETECTED NOT DETECTED Final   Coronavirus OC43 NOT DETECTED NOT DETECTED Final   Metapneumovirus NOT DETECTED NOT DETECTED Final   Rhinovirus / Enterovirus NOT DETECTED NOT DETECTED Final   Influenza A NOT DETECTED NOT DETECTED Final   Influenza B NOT DETECTED NOT DETECTED Final   Parainfluenza Virus 1 NOT DETECTED NOT DETECTED Final   Parainfluenza Virus 2 NOT DETECTED NOT DETECTED Final   Parainfluenza Virus 3 NOT DETECTED NOT DETECTED Final   Parainfluenza Virus 4 NOT DETECTED NOT DETECTED Final   Respiratory Syncytial Virus NOT DETECTED NOT DETECTED Final   Bordetella pertussis NOT DETECTED NOT DETECTED Final   Bordetella Parapertussis NOT DETECTED NOT DETECTED Final   Chlamydophila pneumoniae NOT DETECTED NOT DETECTED Final   Mycoplasma pneumoniae NOT DETECTED NOT DETECTED Final    Comment: Performed at [facility], [address], [place], [place] [address]     Time coordinating discharge: 32 minutes  SIGNED:   Renato Applebaum, MD  [facility] 03/15/2024, 10:27 AM     [1]  Allergies Allergen Reactions   Chlorzoxazone Other (See Comments)    Other reaction(s): Angioedema (ALLERGY/intolerance)   Piroxicam Shortness Of Breath and Swelling    Tongue   Latex Other (See Comments) and Rash    Rash and itching Other reaction(s): Other (See Comments) Burning Feeling   Adhesive [Tape] Other (See Comments)    welts Unknown paper tape ok to use

## 2024-03-15 NOTE — Telephone Encounter (Signed)
 Pt of Dr. Barbaraann. Passed 3rd attempt. Does Dr. Barbaraann want to refill? Please advise.

## 2024-03-15 NOTE — Progress Notes (Signed)
 Physical Therapy Treatment Patient Details Name: Phillip Maldonado MRN: [id number] DOB: 02-11-1951 Today's Date: 03/15/2024   History of Present Illness 73 yo male presents to therapy following hospitalization on 03/11/2024 due to fatigue, generalized weakness, fall, sore throat and fever. Pt found to have sepsis secondary to unknown etiology. Pt PMH includes but is not limited to: CAD, HFpEF, A-fib, DM II, HTN, OSA, obesity, CKD III, PVD, GAD, CHF, NSTEMI, OA. chronic pain syndrome, back and neck surgeries and PTSD.    PT Comments   Pt admitted with above diagnosis.  Pt currently with functional limitations due to the deficits listed below (see PT Problem List). Pt seated in recliner when PT arrived. Pt agreeable to therapy intervention and motivated to return home to spouse. Pt appeared to be upset with PT evaluation and d/c recommendations from 12/30. Pt demonstrated increased IND with all functional mobility tasks today and indicated was at baseline and requesting to d/c home from MD. Pt required CGA for sit to stand  from recliner, gait tasks in hallway with rollator 50 feet x 2 with CGA and progressing to close S, overt LOB and pt limited secondary to fatigue with one seated therapeutic rest break between amb bout. Pt will benefit from [facility] services at time of d/c. Pt left seated in recliner and all needs in place. Pt will benefit from acute skilled PT to increase their independence and safety with mobility to allow discharge.      If plan is discharge home, recommend the following: A little help with walking and/or transfers;A little help with bathing/dressing/bathroom;Assistance with cooking/housework;Assist for transportation;Help with stairs or ramp for entrance   Can travel by private vehicle     Yes  Equipment Recommendations  None recommended by PT    Recommendations for Other Services       Precautions / Restrictions Precautions Precautions: Fall;Other  (comment) Precaution/Restrictions Comments: limited R shoulder ROM Restrictions Weight Bearing Restrictions Per Provider Order: No     Mobility  Bed Mobility               General bed mobility comments: pt seated in recliner and returned to recliner at end of session    Transfers Overall transfer level: Needs assistance Equipment used: Rolling walker (2 wheels) Transfers: Sit to/from Stand Sit to Stand: Contact guard assist           General transfer comment: pt requried min cues and CGA for sit to stand from reclienr to Rollator, pt exhibits poor recall for Rollator brake management and uses rollator at baseline    Ambulation/Gait Ambulation/Gait assistance: Contact guard assist, Supervision Gait Distance (Feet): 50 Feet Assistive device: Rolling walker (2 wheels) Gait Pattern/deviations: Step-to pattern, Trunk flexed, Wide base of support Gait velocity: decreased     General Gait Details: wide BOS with lateral sway and trunk flexion, min cues for safety and Rollator management, pt able to progress to gait tasks in hallway and from CGA to close S   Stairs             Wheelchair Mobility     Tilt Bed    Modified Rankin (Stroke Patients Only)       Balance Overall balance assessment: Needs assistance, History of Falls Sitting-balance support: Feet supported Sitting balance-Leahy Scale: Good     Standing balance support: Bilateral upper extremity supported, During functional activity, Reliant on assistive device for balance Standing balance-Leahy Scale: Poor Standing balance comment: B UE at rollator with close S  Communication Communication Communication: No apparent difficulties  Cognition Arousal: Alert Behavior During Therapy: WFL for tasks assessed/performed   PT - Cognitive impairments: No apparent impairments                         Following commands: Intact      Cueing Cueing  Techniques: Verbal cues  Exercises      General Comments        Pertinent Vitals/Pain Pain Assessment Pain Assessment: Faces Faces Pain Scale: Hurts little more Pain Location: R knee, buttocks, HA and R UE Pain Descriptors / Indicators: Constant, Discomfort, Grimacing Pain Intervention(s): Limited activity within patient's tolerance    Home Living                          Prior Function            PT Goals (current goals can now be found in the care plan section) Acute Rehab PT Goals Patient Stated Goal: to get well and go home PT Goal Formulation: With patient Time For Goal Achievement: 03/28/24 Potential to Achieve Goals: Good Progress towards PT goals: Progressing toward goals    Frequency    Min 3X/week      PT Plan      Co-evaluation              AM-PAC PT 6 Clicks Mobility   Outcome Measure  Help needed turning from your back to your side while in a flat bed without using bedrails?: A Little Help needed moving from lying on your back to sitting on the side of a flat bed without using bedrails?: A Lot Help needed moving to and from a bed to a chair (including a wheelchair)?: A Little Help needed standing up from a chair using your arms (e.g., wheelchair or bedside chair)?: A Little Help needed to walk in hospital room?: A Little Help needed climbing 3-5 steps with a railing? : Total 6 Click Score: 15    End of Session Equipment Utilized During Treatment: Gait belt Activity Tolerance: Patient tolerated treatment well Patient left: in chair;with call bell/phone within reach Nurse Communication: Mobility status PT Visit Diagnosis: Other abnormalities of gait and mobility (R26.89);Unsteadiness on feet (R26.81);Muscle weakness (generalized) (M62.81);Pain;Difficulty in walking, not elsewhere classified (R26.2)     Time: 9051-8979 PT Time Calculation (min) (ACUTE ONLY): 32 min  Charges:    $Gait Training: 8-22 mins $Therapeutic  Activity: 8-22 mins PT General Charges $$ ACUTE PT VISIT: 1 Visit                     Phillip Maldonado, PT Acute Rehab    Phillip Maldonado Phillip Maldonado 03/15/2024, 2:04 PM

## 2024-03-15 NOTE — Progress Notes (Signed)
 Discharge instructions reviewed with patient, verbalized understanding. All questions answered. All belongings accounted for. Patient to follow up with MD in  1-2 weeks. PIV removed. Assisted via WC to private vehicle.

## 2024-03-15 NOTE — Progress Notes (Signed)
 Discharge meds in a secure bag delivered to patient by this RN

## 2024-03-15 NOTE — Plan of Care (Signed)
" °  Problem: Education: Goal: Ability to describe self-care measures that may prevent or decrease complications (Diabetes Survival Skills Education) will improve Outcome: Progressing   Problem: Metabolic: Goal: Ability to maintain appropriate glucose levels will improve Outcome: Progressing   Problem: Nutritional: Goal: Maintenance of adequate nutrition will improve Outcome: Progressing   Problem: Activity: Goal: Risk for activity intolerance will decrease Outcome: Progressing   Problem: Nutrition: Goal: Adequate nutrition will be maintained Outcome: Progressing   Problem: Pain Managment: Goal: General experience of comfort will improve and/or be controlled Outcome: Progressing   Problem: Safety: Goal: Ability to remain free from injury will improve Outcome: Progressing   "

## 2024-03-17 LAB — CULTURE, BLOOD (ROUTINE X 2): Culture: NO GROWTH
